# Patient Record
Sex: Female | Born: 1935
Health system: Southern US, Community
[De-identification: ages and names within clinical notes are randomized; demographics above are authoritative.]

## PROBLEM LIST (undated history)

## (undated) DIAGNOSIS — D649 Anemia, unspecified: Secondary | ICD-10-CM

## (undated) DIAGNOSIS — G20 Parkinson's disease: Secondary | ICD-10-CM

## (undated) DIAGNOSIS — R29898 Other symptoms and signs involving the musculoskeletal system: Secondary | ICD-10-CM

## (undated) DIAGNOSIS — L4050 Arthropathic psoriasis, unspecified: Secondary | ICD-10-CM

## (undated) DIAGNOSIS — H04123 Dry eye syndrome of bilateral lacrimal glands: Secondary | ICD-10-CM

## (undated) DIAGNOSIS — C679 Malignant neoplasm of bladder, unspecified: Secondary | ICD-10-CM

## (undated) DIAGNOSIS — F418 Other specified anxiety disorders: Secondary | ICD-10-CM

## (undated) DIAGNOSIS — I10 Essential (primary) hypertension: Secondary | ICD-10-CM

## (undated) DIAGNOSIS — M47812 Spondylosis without myelopathy or radiculopathy, cervical region: Secondary | ICD-10-CM

## (undated) DIAGNOSIS — M542 Cervicalgia: Secondary | ICD-10-CM

## (undated) DIAGNOSIS — H269 Unspecified cataract: Secondary | ICD-10-CM

## (undated) DIAGNOSIS — M1990 Unspecified osteoarthritis, unspecified site: Secondary | ICD-10-CM

## (undated) DIAGNOSIS — S66819A Strain of other specified muscles, fascia and tendons at wrist and hand level, unspecified hand, initial encounter: Secondary | ICD-10-CM

## (undated) DIAGNOSIS — M48061 Spinal stenosis, lumbar region without neurogenic claudication: Secondary | ICD-10-CM

## (undated) DIAGNOSIS — N189 Chronic kidney disease, unspecified: Secondary | ICD-10-CM

## (undated) DIAGNOSIS — D229 Melanocytic nevi, unspecified: Secondary | ICD-10-CM

## (undated) DIAGNOSIS — R0600 Dyspnea, unspecified: Secondary | ICD-10-CM

## (undated) DIAGNOSIS — T7840XA Allergy, unspecified, initial encounter: Secondary | ICD-10-CM

## (undated) DIAGNOSIS — E785 Hyperlipidemia, unspecified: Secondary | ICD-10-CM

## (undated) DIAGNOSIS — R310 Gross hematuria: Secondary | ICD-10-CM

## (undated) DIAGNOSIS — Z96612 Presence of left artificial shoulder joint: Secondary | ICD-10-CM

## (undated) DIAGNOSIS — Z87442 Personal history of urinary calculi: Secondary | ICD-10-CM

## (undated) HISTORY — DX: Unspecified cataract: H26.9

## (undated) HISTORY — PX: TONSILLECTOMY AND ADENOIDECTOMY: SUR1326

## (undated) HISTORY — DX: Arthropathic psoriasis, unspecified: L40.50

## (undated) HISTORY — DX: Hyperlipidemia, unspecified: E78.5

## (undated) HISTORY — PX: COSMETIC SURGERY: SHX468

## (undated) HISTORY — DX: Chronic kidney disease, unspecified: N18.9

## (undated) HISTORY — PX: COLONOSCOPY: SHX174

## (undated) HISTORY — DX: Presence of left artificial shoulder joint: Z96.612

## (undated) HISTORY — PX: SPINE SURGERY: SHX786

## (undated) HISTORY — DX: Anemia, unspecified: D64.9

## (undated) HISTORY — PX: CATARACT EXTRACTION W/ INTRAOCULAR LENS  IMPLANT, BILATERAL: SHX1307

## (undated) HISTORY — DX: Allergy, unspecified, initial encounter: T78.40XA

## (undated) HISTORY — DX: Unspecified osteoarthritis, unspecified site: M19.90

## (undated) HISTORY — PX: EYE SURGERY: SHX253

## (undated) HISTORY — DX: Malignant neoplasm of bladder, unspecified: C67.9

## (undated) HISTORY — DX: Other specified anxiety disorders: F41.8

## (undated) HISTORY — PX: LAPAROSCOPY: SHX197

## (undated) HISTORY — DX: Spondylosis without myelopathy or radiculopathy, cervical region: M47.812

## (undated) HISTORY — PX: DILATION AND CURETTAGE OF UTERUS: SHX78

## (undated) HISTORY — DX: Parkinson's disease: G20

## (undated) HISTORY — PX: JOINT REPLACEMENT: SHX530

---
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
## 1996-05-25 HISTORY — PX: METACARPOPHALANGEAL JOINT ARTHRODESIS: SUR59

## 1998-06-09 ENCOUNTER — Ambulatory Visit (HOSPITAL_BASED_OUTPATIENT_CLINIC_OR_DEPARTMENT_OTHER): Admission: RE | Admit: 1998-06-09 | Discharge: 1998-06-09 | Payer: Self-pay | Admitting: Orthopedic Surgery

## 1998-10-21 ENCOUNTER — Other Ambulatory Visit: Admission: RE | Admit: 1998-10-21 | Discharge: 1998-10-21 | Payer: Self-pay | Admitting: Obstetrics and Gynecology

## 1999-09-29 ENCOUNTER — Other Ambulatory Visit: Admission: RE | Admit: 1999-09-29 | Discharge: 1999-09-29 | Payer: Self-pay | Admitting: Obstetrics and Gynecology

## 2000-07-11 ENCOUNTER — Ambulatory Visit (HOSPITAL_COMMUNITY): Admission: RE | Admit: 2000-07-11 | Discharge: 2000-07-11 | Payer: Self-pay | Admitting: Obstetrics and Gynecology

## 2000-07-11 ENCOUNTER — Encounter (INDEPENDENT_AMBULATORY_CARE_PROVIDER_SITE_OTHER): Payer: Self-pay | Admitting: Specialist

## 2000-07-11 HISTORY — PX: OTHER SURGICAL HISTORY: SHX169

## 2000-10-11 ENCOUNTER — Other Ambulatory Visit: Admission: RE | Admit: 2000-10-11 | Discharge: 2000-10-11 | Payer: Self-pay | Admitting: Obstetrics and Gynecology

## 2001-10-18 ENCOUNTER — Other Ambulatory Visit: Admission: RE | Admit: 2001-10-18 | Discharge: 2001-10-18 | Payer: Self-pay | Admitting: Obstetrics and Gynecology

## 2002-07-13 ENCOUNTER — Encounter: Admission: RE | Admit: 2002-07-13 | Discharge: 2002-07-13 | Payer: Self-pay | Admitting: Cardiology

## 2002-07-13 ENCOUNTER — Encounter: Payer: Self-pay | Admitting: Cardiology

## 2002-08-09 ENCOUNTER — Encounter (HOSPITAL_COMMUNITY): Admission: RE | Admit: 2002-08-09 | Discharge: 2002-11-07 | Payer: Self-pay | Admitting: Otolaryngology

## 2002-08-10 ENCOUNTER — Encounter: Payer: Self-pay | Admitting: Otolaryngology

## 2002-08-16 ENCOUNTER — Ambulatory Visit (HOSPITAL_COMMUNITY): Admission: RE | Admit: 2002-08-16 | Discharge: 2002-08-16 | Payer: Self-pay | Admitting: Otolaryngology

## 2002-08-16 ENCOUNTER — Encounter (INDEPENDENT_AMBULATORY_CARE_PROVIDER_SITE_OTHER): Payer: Self-pay | Admitting: Specialist

## 2002-08-16 ENCOUNTER — Encounter: Payer: Self-pay | Admitting: Otolaryngology

## 2002-11-27 ENCOUNTER — Other Ambulatory Visit: Admission: RE | Admit: 2002-11-27 | Discharge: 2002-11-27 | Payer: Self-pay | Admitting: Obstetrics and Gynecology

## 2003-12-24 ENCOUNTER — Other Ambulatory Visit: Admission: RE | Admit: 2003-12-24 | Discharge: 2003-12-24 | Payer: Self-pay | Admitting: Obstetrics and Gynecology

## 2005-02-17 ENCOUNTER — Other Ambulatory Visit: Admission: RE | Admit: 2005-02-17 | Discharge: 2005-02-17 | Payer: Self-pay | Admitting: Obstetrics and Gynecology

## 2006-02-23 ENCOUNTER — Other Ambulatory Visit: Admission: RE | Admit: 2006-02-23 | Discharge: 2006-02-23 | Payer: Self-pay | Admitting: Obstetrics & Gynecology

## 2006-06-20 ENCOUNTER — Encounter: Admission: RE | Admit: 2006-06-20 | Discharge: 2006-06-20 | Payer: Self-pay | Admitting: Orthopedic Surgery

## 2006-06-23 ENCOUNTER — Ambulatory Visit (HOSPITAL_BASED_OUTPATIENT_CLINIC_OR_DEPARTMENT_OTHER): Admission: RE | Admit: 2006-06-23 | Discharge: 2006-06-23 | Payer: Self-pay | Admitting: Orthopedic Surgery

## 2006-06-23 ENCOUNTER — Encounter (INDEPENDENT_AMBULATORY_CARE_PROVIDER_SITE_OTHER): Payer: Self-pay | Admitting: *Deleted

## 2006-06-23 HISTORY — PX: OTHER SURGICAL HISTORY: SHX169

## 2007-03-02 ENCOUNTER — Other Ambulatory Visit: Admission: RE | Admit: 2007-03-02 | Discharge: 2007-03-02 | Payer: Self-pay | Admitting: Obstetrics & Gynecology

## 2008-03-05 ENCOUNTER — Other Ambulatory Visit: Admission: RE | Admit: 2008-03-05 | Discharge: 2008-03-05 | Payer: Self-pay | Admitting: Obstetrics and Gynecology

## 2009-01-06 ENCOUNTER — Encounter: Admission: RE | Admit: 2009-01-06 | Discharge: 2009-01-06 | Payer: Self-pay | Admitting: Orthopedic Surgery

## 2009-01-09 ENCOUNTER — Ambulatory Visit (HOSPITAL_BASED_OUTPATIENT_CLINIC_OR_DEPARTMENT_OTHER): Admission: RE | Admit: 2009-01-09 | Discharge: 2009-01-09 | Payer: Self-pay | Admitting: Orthopedic Surgery

## 2009-01-09 ENCOUNTER — Encounter (INDEPENDENT_AMBULATORY_CARE_PROVIDER_SITE_OTHER): Payer: Self-pay | Admitting: Orthopedic Surgery

## 2009-01-09 HISTORY — PX: OTHER SURGICAL HISTORY: SHX169

## 2010-07-15 ENCOUNTER — Ambulatory Visit: Payer: Self-pay | Admitting: Oncology

## 2010-07-24 LAB — CBC WITH DIFFERENTIAL/PLATELET
BASO%: 0.4 % (ref 0.0–2.0)
Basophils Absolute: 0 10*3/uL (ref 0.0–0.1)
EOS%: 0.9 % (ref 0.0–7.0)
Eosinophils Absolute: 0.1 10*3/uL (ref 0.0–0.5)
HCT: 43.1 % (ref 34.8–46.6)
HGB: 14.7 g/dL (ref 11.6–15.9)
LYMPH%: 12.8 % — ABNORMAL LOW (ref 14.0–49.7)
MCH: 30.9 pg (ref 25.1–34.0)
MCHC: 34.2 g/dL (ref 31.5–36.0)
MCV: 90.4 fL (ref 79.5–101.0)
MONO#: 0.4 10*3/uL (ref 0.1–0.9)
MONO%: 5.4 % (ref 0.0–14.0)
NEUT#: 5.6 10*3/uL (ref 1.5–6.5)
NEUT%: 80.5 % — ABNORMAL HIGH (ref 38.4–76.8)
Platelets: 189 10*3/uL (ref 145–400)
RBC: 4.77 10*6/uL (ref 3.70–5.45)
RDW: 12.7 % (ref 11.2–14.5)
WBC: 7 10*3/uL (ref 3.9–10.3)
lymph#: 0.9 10*3/uL (ref 0.9–3.3)

## 2010-08-04 LAB — HYPERCOAGULABLE PANEL, COMPREHENSIVE
AntiThromb III Func: 98 % (ref 76–126)
Anticardiolipin IgA: 1 APL U/mL (ref <22)
Anticardiolipin IgG: 17 GPL U/mL (ref <23)
Anticardiolipin IgM: 1 MPL U/mL (ref <11)
Beta-2 Glyco I IgG: 0 G Units (ref <20)
Beta-2-Glycoprotein I IgA: 1 A Units (ref <20)
Beta-2-Glycoprotein I IgM: 3 M Units (ref <20)
DRVVT: 35.7 secs — ABNORMAL LOW (ref 36.2–44.3)
Homocysteine: 9.8 umol/L (ref 4.0–15.4)
Lupus Anticoagulant: NOT DETECTED
PTT Lupus Anticoagulant: 30.4 secs (ref 30.0–45.6)
Protein C Activity: 107 % (ref 75–133)
Protein C, Total: 71 % (ref 70–140)
Protein S Activity: 70 % (ref 69–129)
Protein S Ag, Total: 80 % (ref 70–140)

## 2010-08-04 LAB — COMPREHENSIVE METABOLIC PANEL
ALT: 16 U/L (ref 0–35)
AST: 23 U/L (ref 0–37)
Albumin: 4.5 g/dL (ref 3.5–5.2)
Alkaline Phosphatase: 37 U/L — ABNORMAL LOW (ref 39–117)
BUN: 19 mg/dL (ref 6–23)
CO2: 24 mEq/L (ref 19–32)
Calcium: 9.7 mg/dL (ref 8.4–10.5)
Chloride: 102 mEq/L (ref 96–112)
Creatinine, Ser: 0.83 mg/dL (ref 0.40–1.20)
Glucose, Bld: 99 mg/dL (ref 70–99)
Potassium: 3.7 mEq/L (ref 3.5–5.3)
Sodium: 138 mEq/L (ref 135–145)
Total Bilirubin: 0.7 mg/dL (ref 0.3–1.2)
Total Protein: 6.3 g/dL (ref 6.0–8.3)

## 2010-08-04 LAB — PROTHROMBIN TIME: Prothrombin Time: 14.5 seconds (ref 11.6–15.2)

## 2010-08-04 LAB — APTT: aPTT: 25 seconds (ref 24–37)

## 2010-11-15 DIAGNOSIS — G20 Parkinson's disease: Secondary | ICD-10-CM

## 2010-11-15 DIAGNOSIS — G20A1 Parkinson's disease without dyskinesia, without mention of fluctuations: Secondary | ICD-10-CM

## 2010-11-15 HISTORY — DX: Parkinson's disease: G20

## 2010-11-15 HISTORY — DX: Parkinson's disease without dyskinesia, without mention of fluctuations: G20.A1

## 2010-11-17 ENCOUNTER — Ambulatory Visit: Payer: Self-pay | Admitting: Cardiology

## 2011-03-02 LAB — BASIC METABOLIC PANEL
BUN: 15 mg/dL (ref 6–23)
CO2: 28 mEq/L (ref 19–32)
Calcium: 9.3 mg/dL (ref 8.4–10.5)
GFR calc non Af Amer: >60 mL/min (ref >60)
Glucose, Bld: 108 mg/dL — ABNORMAL HIGH (ref 70–99)
Sodium: 136 mEq/L (ref 135–145)

## 2011-03-23 ENCOUNTER — Other Ambulatory Visit: Payer: Self-pay | Admitting: *Deleted

## 2011-03-23 NOTE — Telephone Encounter (Signed)
Refilled meds per fax request.  

## 2011-03-29 MED ORDER — DIAZEPAM 5 MG PO TABS: 5.0000 mg | ORAL_TABLET | Freq: Every evening | ORAL | Status: DC | PRN

## 2011-03-30 NOTE — Op Note (Signed)
NAMEBELENDA, Stephanie [name] NO.:  [id number]   MEDICAL RECORD NO.:  [id number]          PATIENT TYPE:  AMB   LOCATION:  DSC                          FACILITY:  [facility]   PHYSICIAN:  Cindee Salt, M.D.       DATE OF BIRTH:  [birth year]   DATE OF PROCEDURE:  01/09/2009  DATE OF DISCHARGE:                               OPERATIVE REPORT   PREOPERATIVE DIAGNOSIS:  Mass/cyst, right hand.   POSTOPERATIVE DIAGNOSIS:  Mass/cyst, right hand plus foreign body.   OPERATION:  Excision of cyst and debridement, wrist and removal of  foreign body.   SURGEON:  Cindee Salt, MD   ANESTHESIA:  Forearm IV regional.   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  She is admitted now with recurrence of a cyst in the  area of one of the incisions just distal.  X-rays reveal cystic  formation about the joint consistent with silicone synovitis with the  possibility of further debridement.  She is aware of risks and  complications including infection, recurrence, injury to arteries,  nerves, tendons, incomplete relief of symptoms, and dystrophy.  In the  preoperative area, the patient is seen, the extremity marked by both the  patient and surgeon, and antibiotic given.   PROCEDURE IN DETAIL:  The patient was brought to the operating room  where a forearm based IV regional anesthetic was carried out without  difficulty under the direction of Dr. Sampson Goon.  She was prepped using  DuraPrep, supine position, right arm free.  A time-out was taken.  Longitudinal incision was made over the mass, carried down through  subcutaneous tissue.  Neurovascular structures identified and protected.  A cystic structure was immediately encountered.  This was opened.  A  suture was found to be at the base.  This was removed.  The cyst itself  was sent to pathology.  No purulence was seen.  The  wound was irrigated.  The skin was closed with interrupted 5-0 Vicryl Rapide sutures.  Sterile  compressive dressing was applied.  The patient tolerated the procedure  well and was taken to the recovery room for observation in satisfactory  condition.  She will be discharged home to return to the [facility]  [place] in 1 week on [place].           ______________________________  Cindee Salt, M.D.     GK/MEDQ  D:  01/09/2009  T:  01/10/2009  Job:  [id number]

## 2011-04-02 NOTE — Op Note (Signed)
[facility]  Patient:    Stephanie [name], Stephanie [name]                      MRN: [id number] Proc. Date: 07/11/00 Adm. Date:  [id number] Disc. Date: [id number] Attending:  Brynda Peon                           Operative Report  PREOPERATIVE DIAGNOSIS:       Postmenopausal bleeding, secondary to                               endometrial polyp.  POSTOPERATIVE DIAGNOSIS:      Postmenopausal bleeding, secondary to                               endometrial polyp, pathology pending.  PROCEDURE:                    1. Hysteroscopy.                               2. Resection of endometrial polyp.                               3. Dilatation and curettage.  SURGEON:                      Cynthia P. Ashley Royalty, M.D.  ANESTHESIA:                   General by LMA.  ESTIMATED BLOOD LOSS:         Minimal.  COMPLICATIONS:                None.  FLUID DEFICIT:                100 cc.  DESCRIPTION OF PROCEDURE:     The patient was taken to the operating room and after the induction of adequate general anesthesia, was placed in the dorsal lithotomy position and prepped and draped in the usual fashion.  The cervix was dilated to a #31 Pratt.  The operative hysteroscope was introduced.  There was noted to be a long thin endometrial polyp that had been previously noted on ultrasound.  This was removed with the wire loop.  The hysteroscope was then removed.  An endometrial curettage was carried out.  The specimens sent to pathology.  The hysteroscope was reinserted and the endometrial cavity appeared clean.  The procedure was terminated.  The patient tolerated the procedure well and went in satisfactory condition to the post anesthesia recovery. DD:  07/28/00 TD:  07/30/00 Job: [id number]

## 2011-04-02 NOTE — Op Note (Signed)
Stephanie [name], Stephanie [name] NO.:  [id number]   MEDICAL RECORD NO.:  [id number]          PATIENT TYPE:  AMB   LOCATION:  DSC                          FACILITY:  [facility]   PHYSICIAN:  Cindee Salt, M.D.       DATE OF BIRTH:  1932-07-25   DATE OF PROCEDURE:  DATE OF DISCHARGE:                                 OPERATIVE REPORT   SURGEON:  Cindee Salt, M.D.   ASSISTANT:  __________, RN.   PREOPERATIVE DIAGNOSIS:  Mass, metacarpal, left thumb.   POSTOPERATIVE DIAGNOSIS:  Mass, metacarpal, left thumb.   OPERATION:  Biopsy, left first metacarpal.   ANESTHESIA:  Forearm-based IV regional.   HISTORY:  The patient is a 77 year old female with a history of pantrapezial  arthritis.  She has had a CT scan done revealing a lesion in her first  metacarpal.  She is admitted for biopsy.   PROCEDURE:  The patient was brought to the operating room, where a forearm-  based IV regional anesthetic was carried out without difficulty.  She was  prepped and draped using DuraPrep in supine position, left arm free.  A  longitudinal incision was made over the base of the firs metacarpal, the  radial aspect, carried down through subcutaneous tissue.  The sensory nerves  were identified and protected.  The dissection was carried down to the  metacarpal.  A drill hole was placed under image intensification.  A curet  was used to remove a portion of the medullary bone.  Cultures were also  taken for both aerobic, anaerobic and fungal cultures.  The biopsied  specimen was sent to pathology.  The wound was irrigated.  The periosteum  was closed with interrupted 4-0 chromic sutures, the subcutaneous tissue  with 4-0 chromic, and the skin with interrupted 5-0 nylon sutures.  Sterile  compressive dressing and thumb spica splint applied.  The patient tolerated  the procedure well and was taken to the recovery room for observation in  satisfactory condition.  She is discharged home to return to the hand  clinic  in 1 week on [place] Nx.           ______________________________  Cindee Salt, M.D.     GK/MEDQ  D:  06/23/2006  T:  06/23/2006  Job:  [id number]

## 2011-05-05 ENCOUNTER — Encounter: Payer: Self-pay | Admitting: Cardiology

## 2011-05-11 ENCOUNTER — Other Ambulatory Visit: Payer: Self-pay | Admitting: *Deleted

## 2011-05-11 DIAGNOSIS — E785 Hyperlipidemia, unspecified: Secondary | ICD-10-CM

## 2011-05-12 ENCOUNTER — Ambulatory Visit (INDEPENDENT_AMBULATORY_CARE_PROVIDER_SITE_OTHER): Payer: Medicare Other | Admitting: Cardiology

## 2011-05-12 ENCOUNTER — Encounter: Payer: Self-pay | Admitting: Cardiology

## 2011-05-12 ENCOUNTER — Other Ambulatory Visit (INDEPENDENT_AMBULATORY_CARE_PROVIDER_SITE_OTHER): Payer: Medicare Other | Admitting: *Deleted

## 2011-05-12 DIAGNOSIS — R109 Unspecified abdominal pain: Secondary | ICD-10-CM

## 2011-05-12 DIAGNOSIS — G8929 Other chronic pain: Secondary | ICD-10-CM | POA: Insufficient documentation

## 2011-05-12 DIAGNOSIS — I119 Hypertensive heart disease without heart failure: Secondary | ICD-10-CM | POA: Insufficient documentation

## 2011-05-12 DIAGNOSIS — E785 Hyperlipidemia, unspecified: Secondary | ICD-10-CM

## 2011-05-12 DIAGNOSIS — R1011 Right upper quadrant pain: Secondary | ICD-10-CM

## 2011-05-12 LAB — LIPID PANEL
HDL: 50.2 mg/dL (ref >39.00)
Total CHOL/HDL Ratio: 4
VLDL: 23.8 mg/dL (ref 0.0–40.0)

## 2011-05-12 LAB — BASIC METABOLIC PANEL
Calcium: 9.4 mg/dL (ref 8.4–10.5)
GFR: 86.17 mL/min (ref >60.00)
Sodium: 139 mEq/L (ref 135–145)

## 2011-05-12 LAB — HEPATIC FUNCTION PANEL
Alkaline Phosphatase: 38 U/L — ABNORMAL LOW (ref 39–117)
Bilirubin, Direct: 0.2 mg/dL (ref 0.0–0.3)

## 2011-05-12 NOTE — Progress Notes (Signed)
Stephanie [name] Date of Birth:  [birth year] [facility] / [facility] [address]   [address] [place], [place]  [address] [phone]           Fax   [phone]  HPI: This pleasant elderly woman is seen for a scheduled followup office visit.  She has a past history of essential hypertension.  She has not been expressing any chest pain or shortness of breath.  He has been under a lot of emotional stress chronically at home.  Recently she's been experiencing some nagging right upper quadrant pain for the past 6 months and she does have a family history of gallstones  Current Outpatient Prescriptions  Medication Sig Dispense Refill  . Calcium-Vitamin D-Vitamin K (CALCIUM + D + K PO) Take 600 mg by mouth daily.        . Cholecalciferol (VITAMIN D3) 2000 UNITS TABS Take by mouth daily.        . Coenzyme Q10 (COQ10 PO) Take by mouth daily.        Marland Kitchen dextromethorphan-guaiFENesin (MUCINEX DM) 30-600 MG per 12 hr tablet Take 1 tablet by mouth as needed.        Marland Kitchen estrogens, conjugated, (PREMARIN) 0.625 MG tablet Take 0.625 mg by mouth daily. Take daily for 21 days then do not take for 7 days.       . Glucosamine-Chondroitin (COSAMIN DS PO) Take by mouth daily.        . hydrochlorothiazide 25 MG tablet Take 25 mg by mouth daily.        . meloxicam (MOBIC) 7.5 MG tablet Take 7.5 mg by mouth daily.        . diazepam (VALIUM) 5 MG tablet Take 5 mg by mouth at bedtime as needed. Taking 1/2 as needed       . DISCONTD: diazepam (VALIUM) 5 MG tablet Take 1 tablet (5 mg total) by mouth at bedtime as needed for sleep.  30 tablet  2    Allergies  Allergen Reactions  . Lodine (Etodolac)   . Penicillins     Patient Active Problem List  Diagnoses  . Abdominal pain, chronic, right upper quadrant  . Benign hypertensive heart disease without heart failure    History  Smoking status  . Former Smoker  . Quit date: 05/04/1966  Smokeless tobacco  . Not on file    History  Alcohol Use  No    Family History  Problem Relation Age of Onset  . Arthritis Mother   . Arthritis Father   . Diabetes Sister     Review of Systems: The patient denies any heat or cold intolerance.  No weight gain or weight loss.  The patient denies headaches or blurry vision.  There is no cough or sputum production.  The patient denies dizziness.  There is no hematuria or hematochezia.  The patient denies any muscle aches or arthritis.  The patient denies any rash.  The patient denies frequent falling or instability.  There is no history of depression or anxiety.  All other systems were reviewed and are negative.   Physical Exam: Filed Vitals:   05/12/11 0857  BP: 136/80  Pulse: 74  The general appearance feels a well-developed well-nourished woman in no distress.The head and neck exam reveals pupils equal and reactive.  Extraocular movements are full.  There is no scleral icterus.  The mouth and pharynx are normal.  The neck is supple.  The carotids reveal no bruits.  The jugular  venous pressure is normal.  The  thyroid is not enlarged.  There is no lymphadenopathy.  The chest is clear to percussion and auscultation.  There are no rales or rhonchi.  Expansion of the chest is symmetrical.  The precordium is quiet.  The first heart sound is normal.  The second heart sound is physiologically split.  There is no murmur gallop rub or click.  There is no abnormal lift or heave.  The abdomen is soft and nontender.  The bowel sounds are normal.  The liver and spleen are not enlarged.  There are no abdominal masses.  There are no abdominal bruits.  Extremities reveal good pedal pulses.  There is no phlebitis or edema.  There is no cyanosis or clubbing.  Strength is normal and symmetrical in all extremities.  There is no lateralizing weakness.  There are no sensory deficits.  The skin is warm and dry.  There is no rash.    Assessment / Plan: Continue present medication.  We will arrange for upper abdominal  ultrasound to look into the possibility of cholelithiasis.  Recheck in 6 months for followup office visit fasting lipid panel hepatic function panel and basal metabolic panel

## 2011-05-12 NOTE — Assessment & Plan Note (Signed)
The patient has been experiencing some intermittent right upper quadrant pain for the past 6 months.  She does not have known cholelithiasis but both her sister and her mother had their gallbladders taken out.  We will plan to get an ultrasound on her.  She does not know if food makes the pain worse or not but she thinks it might.

## 2011-05-13 ENCOUNTER — Telehealth: Payer: Self-pay | Admitting: *Deleted

## 2011-05-13 ENCOUNTER — Ambulatory Visit
Admission: RE | Admit: 2011-05-13 | Discharge: 2011-05-13 | Disposition: A | Discharge status: Home / Self Care | Payer: Medicare Other | Source: Ambulatory Visit | Attending: Cardiology | Admitting: Cardiology

## 2011-05-13 NOTE — Telephone Encounter (Signed)
Message copied by Burnell Blanks on Thu May 13, 2011 11:34 AM ------      Message from: Cassell Clement      Created: Thu May 13, 2011 10:17 AM       Please report.  The kidney and liver tests are normal.  Potassium is normal.The triglycerides are normal and the LDL cholesterol is slightly elevated.  Continue prudent diet and regular exercise.

## 2011-05-13 NOTE — Progress Notes (Signed)
Advised 

## 2011-05-13 NOTE — Telephone Encounter (Signed)
Message copied by Burnell Blanks on Thu May 13, 2011 11:34 AM ------      Message from: Cassell Clement      Created: Thu May 13, 2011 10:18 AM       Please report.  The gallbladder is normal.  The entire ultrasound was fine.  No problems seen.

## 2011-05-13 NOTE — Telephone Encounter (Signed)
Advised of labs 

## 2011-05-13 NOTE — Progress Notes (Signed)
advised

## 2011-05-14 ENCOUNTER — Other Ambulatory Visit: Payer: Self-pay | Admitting: Cardiology

## 2011-05-14 DIAGNOSIS — I119 Hypertensive heart disease without heart failure: Secondary | ICD-10-CM

## 2011-05-14 DIAGNOSIS — F419 Anxiety disorder, unspecified: Secondary | ICD-10-CM

## 2011-05-14 MED ORDER — DIAZEPAM 5 MG PO TABS: ORAL_TABLET | ORAL | Status: DC

## 2011-05-14 NOTE — Telephone Encounter (Signed)
Spoke with patient and she still uses valium prn and received refill request via fax.

## 2011-08-04 ENCOUNTER — Telehealth: Payer: Self-pay | Admitting: Cardiology

## 2011-08-04 NOTE — Telephone Encounter (Signed)
Pt called. She had a few questions about a primary care Dr.m Please call before 2pm if possible

## 2011-08-05 NOTE — Telephone Encounter (Signed)
Keep appointment with physician she has and if not happy will recommend someone else

## 2011-08-25 ENCOUNTER — Encounter: Payer: Self-pay | Admitting: Family Medicine

## 2011-08-25 ENCOUNTER — Ambulatory Visit (INDEPENDENT_AMBULATORY_CARE_PROVIDER_SITE_OTHER): Payer: Medicare Other | Admitting: Family Medicine

## 2011-08-25 DIAGNOSIS — M1990 Unspecified osteoarthritis, unspecified site: Secondary | ICD-10-CM

## 2011-08-25 DIAGNOSIS — R259 Unspecified abnormal involuntary movements: Secondary | ICD-10-CM

## 2011-08-25 DIAGNOSIS — F32A Depression, unspecified: Secondary | ICD-10-CM

## 2011-08-25 DIAGNOSIS — F329 Major depressive disorder, single episode, unspecified: Secondary | ICD-10-CM

## 2011-08-25 DIAGNOSIS — F3289 Other specified depressive episodes: Secondary | ICD-10-CM

## 2011-08-25 DIAGNOSIS — I10 Essential (primary) hypertension: Secondary | ICD-10-CM

## 2011-08-25 DIAGNOSIS — M129 Arthropathy, unspecified: Secondary | ICD-10-CM

## 2011-08-25 DIAGNOSIS — R251 Tremor, unspecified: Secondary | ICD-10-CM

## 2011-08-25 NOTE — Patient Instructions (Signed)
Schedule a complete physical at your convenience Keep up the good work!  You look great! Consider starting a daily medication for depression/anxiety- we can do this at any time Call with any questions or concerns Think of [place] as your home base Welcome!  We're glad to have you! Hang in there!

## 2011-08-25 NOTE — Progress Notes (Signed)
  Subjective:    Patient ID: Stephanie [name], female    DOB: 10-01-1932, 77 y.o.   MRN: [id number]  HPI New to establish.  Previous MD- [facility].  GYN- Leda Quail, UTD on pap and mammo.  Colonoscopy- 6-7 yrs ago  HTN- chronic problem for pt, on HCTZ.  Well controlled.  Walks 2 miles daily.  No CP, SOB, HAs, visual changes, edema.  Depression- chronic problem for pt, husband was frequenting prostitutes.  They are now in counseling.  Takes 1/2 valium nightly.  Pt prefers not to start daily medicine.  Sleeping well.  Arthritis- severe arthritis of L hip and R hand.  Has seen Dr Merlyn Lot and Dr Despina Hick.  Taking daily Voltaren and has had hip injxn.  Walking regularly.  Tremor- is seeing Dr Rubin Payor at [facility], tremor of L hand, 'i have 2 of 4 markers for Parkinsons'.  Not on meds.   Review of Systems For ROS see HPI     Objective:   Physical Exam  Vitals reviewed. Constitutional: She is oriented to person, place, and time. She appears well-developed and well-nourished. No distress.  HENT:  Head: Normocephalic and atraumatic.  Eyes: Conjunctivae and EOM are normal. Pupils are equal, round, and reactive to light.  Neck: Normal range of motion. Neck supple. No thyromegaly present.  Cardiovascular: Normal rate, regular rhythm, normal heart sounds and intact distal pulses.   No murmur heard. Pulmonary/Chest: Effort normal and breath sounds normal. No respiratory distress.  Abdominal: Soft. She exhibits no distension. There is no tenderness.  Musculoskeletal: She exhibits no edema.  Lymphadenopathy:    She has no cervical adenopathy.  Neurological: She is alert and oriented to person, place, and time.       Faint resting tremor of L hand  Skin: Skin is warm and dry.  Psychiatric: She has a normal mood and affect. Her behavior is normal.          Assessment & Plan:

## 2011-08-27 ENCOUNTER — Ambulatory Visit (INDEPENDENT_AMBULATORY_CARE_PROVIDER_SITE_OTHER): Payer: Medicare Other | Admitting: Family Medicine

## 2011-08-27 DIAGNOSIS — Z23 Encounter for immunization: Secondary | ICD-10-CM

## 2011-08-27 NOTE — Progress Notes (Signed)
  Subjective:    Patient ID: Stephanie [name], female    DOB: 1932-07-28, 77 y.o.   MRN: [id number]  HPI Here today for shingles shot   Review of Systems     Objective:   Physical Exam        Assessment & Plan:

## 2011-08-31 DIAGNOSIS — M25561 Pain in right knee: Secondary | ICD-10-CM | POA: Insufficient documentation

## 2011-08-31 DIAGNOSIS — I10 Essential (primary) hypertension: Secondary | ICD-10-CM | POA: Insufficient documentation

## 2011-08-31 DIAGNOSIS — R251 Tremor, unspecified: Secondary | ICD-10-CM | POA: Insufficient documentation

## 2011-08-31 DIAGNOSIS — F32A Depression, unspecified: Secondary | ICD-10-CM | POA: Insufficient documentation

## 2011-08-31 DIAGNOSIS — F329 Major depressive disorder, single episode, unspecified: Secondary | ICD-10-CM | POA: Insufficient documentation

## 2011-08-31 NOTE — Assessment & Plan Note (Signed)
Following w/ neuro at [facility].  ? Parkinson's.  Will follow along and assist as able.

## 2011-08-31 NOTE — Assessment & Plan Note (Signed)
Excellent control.  Asymptomatic.  No changes. 

## 2011-08-31 NOTE — Assessment & Plan Note (Signed)
Pt is following w/ specialists for this.  On voltaren daily and receiving prn hip injxns.  Will continue to follow and assist as able.

## 2011-08-31 NOTE — Assessment & Plan Note (Signed)
Pt highly stressed by situation w/ husband.  In counseling.  Not interested in daily meds.  Using Valium prn.  Will continue this regimen as long as it is working for pt.  Will follow closely.

## 2011-09-09 ENCOUNTER — Other Ambulatory Visit (INDEPENDENT_AMBULATORY_CARE_PROVIDER_SITE_OTHER): Payer: Medicare Other

## 2011-09-09 DIAGNOSIS — M19049 Primary osteoarthritis, unspecified hand: Secondary | ICD-10-CM

## 2011-09-09 NOTE — Progress Notes (Signed)
Labs only

## 2011-09-10 LAB — CBC WITH DIFFERENTIAL/PLATELET
Basophils Relative: 0.4 % (ref 0.0–3.0)
Eosinophils Absolute: 0.2 10*3/uL (ref 0.0–0.7)
Eosinophils Relative: 2.9 % (ref 0.0–5.0)
Lymphocytes Relative: 19.4 % (ref 12.0–46.0)
MCHC: 34 g/dL (ref 30.0–36.0)
Neutrophils Relative %: 72.6 % (ref 43.0–77.0)
RBC: 4.32 Mil/uL (ref 3.87–5.11)
WBC: 5.4 10*3/uL (ref 4.5–10.5)

## 2011-09-10 LAB — HEPATIC FUNCTION PANEL
AST: 32 U/L (ref 0–37)
Total Bilirubin: 0.6 mg/dL (ref 0.3–1.2)

## 2011-09-10 LAB — CREATININE, SERUM: Creatinine, Ser: 0.9 mg/dL (ref 0.4–1.2)

## 2011-09-17 ENCOUNTER — Encounter: Payer: Self-pay | Admitting: *Deleted

## 2011-10-25 ENCOUNTER — Telehealth: Payer: Self-pay | Admitting: Family Medicine

## 2011-10-26 NOTE — Telephone Encounter (Signed)
Ok to start Zoloft 50mg - should start w/ 1/2 tab nightly x1 week and then increase to 1 tab daily.   #30, 3 refills.  Should schedule appt for 1 month after starting meds to make sure things are improving.

## 2011-10-26 NOTE — Telephone Encounter (Signed)
Last OV 08-27-11

## 2011-10-27 NOTE — Telephone Encounter (Signed)
Spoke to pt about Zoloft, pt advised that her husband takes Lexapro and she was wondering if she could take this per she is thinking this is not as strong as Zoloft?

## 2011-10-28 MED ORDER — ESCITALOPRAM OXALATE 10 MG PO TABS: 10.0000 mg | ORAL_TABLET | Freq: Every day | ORAL | Status: DC

## 2011-10-28 NOTE — Telephone Encounter (Signed)
rx sent to pharmacy by e-script Pt is aware, will pick up from [facility]

## 2011-10-28 NOTE — Telephone Encounter (Signed)
Lexapro 10mg  would be fine.

## 2011-11-03 ENCOUNTER — Other Ambulatory Visit: Payer: Medicare Other | Admitting: *Deleted

## 2011-11-03 ENCOUNTER — Ambulatory Visit (INDEPENDENT_AMBULATORY_CARE_PROVIDER_SITE_OTHER): Payer: Medicare Other | Admitting: Cardiology

## 2011-11-03 ENCOUNTER — Encounter: Payer: Self-pay | Admitting: Cardiology

## 2011-11-03 VITALS — BP 118/80 | HR 80 | Ht 65.0 in | Wt 121.0 lb

## 2011-11-03 DIAGNOSIS — M129 Arthropathy, unspecified: Secondary | ICD-10-CM

## 2011-11-03 DIAGNOSIS — M1990 Unspecified osteoarthritis, unspecified site: Secondary | ICD-10-CM

## 2011-11-03 DIAGNOSIS — I119 Hypertensive heart disease without heart failure: Secondary | ICD-10-CM

## 2011-11-03 DIAGNOSIS — I10 Essential (primary) hypertension: Secondary | ICD-10-CM

## 2011-11-03 LAB — HEPATIC FUNCTION PANEL
AST: 29 U/L (ref 0–37)
Albumin: 4.5 g/dL (ref 3.5–5.2)
Alkaline Phosphatase: 38 U/L — ABNORMAL LOW (ref 39–117)
Total Protein: 6.7 g/dL (ref 6.0–8.3)

## 2011-11-03 LAB — LIPID PANEL
Cholesterol: 182 mg/dL (ref 0–200)
HDL: 54.2 mg/dL (ref >39.00)
LDL Cholesterol: 98 mg/dL (ref 0–99)
Triglycerides: 150 mg/dL — ABNORMAL HIGH (ref 0.0–149.0)

## 2011-11-03 LAB — BASIC METABOLIC PANEL
Calcium: 9.4 mg/dL (ref 8.4–10.5)
Creatinine, Ser: 0.8 mg/dL (ref 0.4–1.2)
Sodium: 139 mEq/L (ref 135–145)

## 2011-11-03 NOTE — Patient Instructions (Signed)
Will obtain labs today and call you with the results  Your physician recommends that you continue on your current medications as directed. Please refer to the Current Medication list given to you today.  Your physician wants you to follow-up in: 6 months You will receive a reminder letter in the mail two months in advance. If you don't receive a letter, please call our office to schedule the follow-up appointment.   

## 2011-11-03 NOTE — Assessment & Plan Note (Signed)
Patient has a history of osteoarthritis.  This has not been a severe problem recently and does not interfere with her activities of daily living.

## 2011-11-03 NOTE — Assessment & Plan Note (Signed)
The patient exercises daily by walking for exercise.  She's having no chest pain or shortness of breath.  Is not having any palpitations

## 2011-11-03 NOTE — Progress Notes (Signed)
[name] Date of Birth:  [birth year] [facility] / [facility] [address]   [address] [place], [place]  [address] [phone]           Fax   [phone]  HPI: This pleasant middle-aged woman is seen for a scheduled 6 month followup office visit.  She has a history of essential hypertension.  She has been under a lot of stress at home.  Her primary care provider recently gave her a prescription for Lexapro but she has not started it yet.  She also recently was evaluated at [facility] by a neurologist for a resting tremor of the left hand.  She was told that she has 2 out of the 4 criteria to make a diagnosis of Parkinson's.  No medication was prescribed yet.  She is to have a followup test for Parkinson's at [facility] in 6 months.  Current Outpatient Prescriptions  Medication Sig Dispense Refill  . Calcium-Vitamin D-Vitamin K (CALCIUM + D + K PO) Take 600 mg by mouth daily.        . Cholecalciferol (VITAMIN D3) 2000 UNITS TABS Take by mouth daily.        . Coenzyme Q10 (COQ10 PO) Take by mouth daily.        Marland Kitchen dextromethorphan-guaiFENesin (MUCINEX DM) 30-600 MG per 12 hr tablet Take 1 tablet by mouth as needed.        . diazepam (VALIUM) 5 MG tablet Taking 1/2 as needed or directed  30 tablet  3  . diclofenac (VOLTAREN) 50 MG EC tablet Take 50 mg by mouth 2 (two) times daily.       Marland Kitchen escitalopram (LEXAPRO) 10 MG tablet Take 1 tablet (10 mg total) by mouth daily.  30 tablet  2  . Glucosamine-Chondroitin (COSAMIN DS PO) Take by mouth daily.        . hydrochlorothiazide 25 MG tablet TAKE 1 TABLET DAILY  90 tablet  3  . Multiple Vitamin (MULTIVITAMIN) tablet Take 1 tablet by mouth daily.        Marland Kitchen PREMPRO 0.45-1.5 MG per tablet Take 1 tablet by mouth daily.         Allergies  Allergen Reactions  . Lodine (Etodolac)   . Penicillins     Patient Active Problem List  Diagnoses  . Abdominal pain, chronic, right upper quadrant  . Benign hypertensive  heart disease without heart failure  . HTN (hypertension)  . Depression  . Arthritis  . Tremor    History  Smoking status  . Former Smoker  . Quit date: 05/04/1966  Smokeless tobacco  . Not on file    History  Alcohol Use No    Family History  Problem Relation Age of Onset  . Arthritis Mother   . Arthritis Father   . Diabetes Sister     Review of Systems: The patient denies any heat or cold intolerance.  No weight gain or weight loss.  The patient denies headaches or blurry vision.  There is no cough or sputum production.  The patient denies dizziness.  There is no hematuria or hematochezia.  The patient denies any muscle aches or arthritis.  The patient denies any rash.  The patient denies frequent falling or instability.  There is no history of depression or anxiety.  All other systems were reviewed and are negative.   Physical Exam: Filed Vitals:   11/03/11 0836  BP: 118/80  Pulse: 80   The general appearance reveals a  well-developed well-nourished woman in no distress.  She does have a resting tremor of her outstretched left hand.The head and neck exam reveals pupils equal and reactive.  Extraocular movements are full.  There is no scleral icterus.  The mouth and pharynx are normal.  The neck is supple.  The carotids reveal no bruits.  The jugular venous pressure is normal.  The  thyroid is not enlarged.  There is no lymphadenopathy.  The chest is clear to percussion and auscultation.  There are no rales or rhonchi.  Expansion of the chest is symmetrical.  The precordium is quiet.  The first heart sound is normal.  The second heart sound is physiologically split.  There is no murmur gallop rub or click.  There is no abnormal lift or heave.  The abdomen is soft and nontender.  The bowel sounds are normal.  The liver and spleen are not enlarged.  There are no abdominal masses.  There are no abdominal bruits.  Extremities reveal good pedal pulses.  There is no phlebitis or edema.   There is no cyanosis or clubbing.  Strength is normal and symmetrical in all extremities.  There is no lateralizing weakness.  There are no sensory deficits.  The skin is warm and dry.  There is no rash.     Assessment / Plan: Continue same medication.  Recheck in 6 months.  Blood work today pending.

## 2011-11-05 ENCOUNTER — Telehealth: Payer: Self-pay | Admitting: *Deleted

## 2011-11-05 NOTE — Telephone Encounter (Signed)
Message copied by Burnell Blanks on Fri Nov 05, 2011  4:53 PM ------      Message from: Cassell Clement      Created: Thu Nov 04, 2011  1:21 PM       Please report.  The labs are stable.  Continue same meds.  Continue careful diet.  TGs are high.  Watch carbs in diet.

## 2011-11-05 NOTE — Telephone Encounter (Signed)
Advised patient and mailed copy

## 2011-11-24 ENCOUNTER — Encounter: Payer: Self-pay | Admitting: Family Medicine

## 2011-11-24 ENCOUNTER — Ambulatory Visit (INDEPENDENT_AMBULATORY_CARE_PROVIDER_SITE_OTHER): Payer: Medicare Other | Admitting: Family Medicine

## 2011-11-24 DIAGNOSIS — I10 Essential (primary) hypertension: Secondary | ICD-10-CM | POA: Diagnosis not present

## 2011-11-24 DIAGNOSIS — Z0000 Encounter for general adult medical examination without abnormal findings: Secondary | ICD-10-CM

## 2011-11-24 DIAGNOSIS — Z23 Encounter for immunization: Secondary | ICD-10-CM | POA: Diagnosis not present

## 2011-11-24 NOTE — Patient Instructions (Signed)
Follow up in 6 months- sooner if you need me You look fantastic!  Keep up the good work! Your recent labs all look great! Call with any questions or concerns Happy New Year!!!

## 2011-11-24 NOTE — Progress Notes (Signed)
  Subjective:    Patient ID: Stephanie [name], female    DOB: 01/21/1932, 78 y.o.   MRN: [id number]  HPI Here today for CPE.  Risk Factors: HTN- chronic problem, excellent control today.  On HCTZ daily. Physical Activity: walks 2 miles every morning Fall Risk: low risk Depression: chronic problem, reports as long as she is physically active she feels 'good' Hearing: normal to conversational tones, mildly diminished to whispered voice ADL's: independent Cognitive: normal linear thought process, memory and attention intact Home Safety: safe at home Height, Weight, BMI, Visual Acuity: see vitals, vision corrected to 20/20 w/ glasses and cataract surgery Counseling: UTD on GYN/DEXA [name]), Colonoscopy 6-7 yrs ago (Dr Juanda Chance) Labs Ordered: See A&P Care Plan: See A&P    Review of Systems Patient reports no vision/ hearing changes, adenopathy,fever, weight change,  persistant/recurrent hoarseness , swallowing issues, chest pain, palpitations, edema, persistant/recurrent cough, hemoptysis, dyspnea (rest/exertional/paroxysmal nocturnal), gastrointestinal bleeding (melena, rectal bleeding), abdominal pain, significant heartburn, bowel changes, GU symptoms (dysuria, hematuria, incontinence), Gyn symptoms (abnormal  bleeding, pain),  syncope, focal weakness, memory loss, numbness & tingling, skin/hair/nail changes, abnormal bruising or bleeding, anxiety, or depression.     Objective:   Physical Exam General Appearance:    Alert, cooperative, no distress, appears stated age  Head:    Normocephalic, without obvious abnormality, atraumatic  Eyes:    PERRL, conjunctiva/corneas clear, EOM's intact, fundi    benign, both eyes  Ears:    Normal TM's and external ear canals, both ears  Nose:   Nares normal, septum midline, mucosa normal, no drainage    or sinus tenderness  Throat:   Lips, mucosa, and tongue normal; teeth and gums normal  Neck:   Supple, symmetrical, trachea midline, no adenopathy;   Thyroid: no enlargement/tenderness/nodules  Back:     Symmetric, no curvature, ROM normal, no CVA tenderness  Lungs:     Clear to auscultation bilaterally, respirations unlabored  Chest Wall:    No tenderness or deformity   Heart:    Regular rate and rhythm, S1 and S2 normal, no murmur, rub   or gallop  Breast Exam:    Deferred to GYN  Abdomen:     Soft, non-tender, bowel sounds active all four quadrants,    no masses, no organomegaly  Genitalia:    Deferred to GYN  Rectal:    Extremities:   Extremities normal, atraumatic, no cyanosis or edema  Pulses:   2+ and symmetric all extremities  Skin:   Skin color, texture, turgor normal, no rashes or lesions  Lymph nodes:   Cervical, supraclavicular, and axillary nodes normal  Neurologic:   CNII-XII intact, normal strength, sensation and reflexes    Throughout, + resting tremor          Assessment & Plan:

## 2011-11-25 NOTE — Assessment & Plan Note (Signed)
Chronic problem.  Well controlled.  No changes. 

## 2011-11-25 NOTE — Assessment & Plan Note (Signed)
Pt's PE WNL.  UTD on health maintenance.  Reviewed recent labs from cards- look good.  Will give pneumovax to get pt UTD.  Anticipatory guidance provided.

## 2011-12-06 ENCOUNTER — Other Ambulatory Visit: Payer: Self-pay | Admitting: *Deleted

## 2011-12-06 DIAGNOSIS — F419 Anxiety disorder, unspecified: Secondary | ICD-10-CM

## 2011-12-06 NOTE — Telephone Encounter (Signed)
Pt left vm stating that she is new to [facility] per was with [facility] and wants a rx for Diazepam sent to [facility] on [address], last OV 11-24-11 noted last refill 05-14-11 #30 with 3 refills from [facility]

## 2011-12-07 MED ORDER — DIAZEPAM 5 MG PO TABS: ORAL_TABLET | ORAL | Status: DC

## 2011-12-07 NOTE — Telephone Encounter (Signed)
.  rx faxed to pharmacy, manually. Pt aware via phone

## 2011-12-07 NOTE — Telephone Encounter (Signed)
Ok for #30, 3 refills 

## 2011-12-14 DIAGNOSIS — G20 Parkinson's disease: Secondary | ICD-10-CM | POA: Diagnosis not present

## 2011-12-14 DIAGNOSIS — G20A1 Parkinson's disease without dyskinesia, without mention of fluctuations: Secondary | ICD-10-CM | POA: Diagnosis not present

## 2011-12-14 DIAGNOSIS — R259 Unspecified abnormal involuntary movements: Secondary | ICD-10-CM | POA: Diagnosis not present

## 2011-12-16 DIAGNOSIS — M19049 Primary osteoarthritis, unspecified hand: Secondary | ICD-10-CM | POA: Diagnosis not present

## 2012-01-28 DIAGNOSIS — Z1231 Encounter for screening mammogram for malignant neoplasm of breast: Secondary | ICD-10-CM | POA: Diagnosis not present

## 2012-02-15 ENCOUNTER — Encounter: Payer: Self-pay | Admitting: Family Medicine

## 2012-02-29 ENCOUNTER — Telehealth: Payer: Self-pay | Admitting: Cardiology

## 2012-02-29 NOTE — Telephone Encounter (Signed)
Spoke with patient and she stated that she received an EOB from her insurance about June 2012 labs.  She called her insurance company and was told to call our office and that this had to be recertified. Explained I didn't know anything about billing and would forward to Beola Cord in billing and have her look into this.

## 2012-02-29 NOTE — Telephone Encounter (Signed)
New msg Pt was calling about bill she received from [employer]. Please call her back

## 2012-03-09 DIAGNOSIS — H35379 Puckering of macula, unspecified eye: Secondary | ICD-10-CM | POA: Insufficient documentation

## 2012-03-09 DIAGNOSIS — Z961 Presence of intraocular lens: Secondary | ICD-10-CM | POA: Insufficient documentation

## 2012-03-09 DIAGNOSIS — H11829 Conjunctivochalasis, unspecified eye: Secondary | ICD-10-CM | POA: Insufficient documentation

## 2012-03-09 DIAGNOSIS — H5200 Hypermetropia, unspecified eye: Secondary | ICD-10-CM | POA: Insufficient documentation

## 2012-03-10 DIAGNOSIS — H02839 Dermatochalasis of unspecified eye, unspecified eyelid: Secondary | ICD-10-CM | POA: Diagnosis not present

## 2012-03-10 DIAGNOSIS — H35379 Puckering of macula, unspecified eye: Secondary | ICD-10-CM | POA: Diagnosis not present

## 2012-03-10 DIAGNOSIS — H524 Presbyopia: Secondary | ICD-10-CM | POA: Diagnosis not present

## 2012-03-10 DIAGNOSIS — H5200 Hypermetropia, unspecified eye: Secondary | ICD-10-CM | POA: Diagnosis not present

## 2012-03-10 DIAGNOSIS — Z961 Presence of intraocular lens: Secondary | ICD-10-CM | POA: Diagnosis not present

## 2012-03-10 DIAGNOSIS — H52209 Unspecified astigmatism, unspecified eye: Secondary | ICD-10-CM | POA: Diagnosis not present

## 2012-05-08 DIAGNOSIS — Z124 Encounter for screening for malignant neoplasm of cervix: Secondary | ICD-10-CM | POA: Diagnosis not present

## 2012-05-08 DIAGNOSIS — Z01419 Encounter for gynecological examination (general) (routine) without abnormal findings: Secondary | ICD-10-CM | POA: Diagnosis not present

## 2012-05-17 ENCOUNTER — Other Ambulatory Visit: Payer: Self-pay | Admitting: *Deleted

## 2012-05-17 DIAGNOSIS — I119 Hypertensive heart disease without heart failure: Secondary | ICD-10-CM

## 2012-05-24 ENCOUNTER — Encounter: Payer: Self-pay | Admitting: Family Medicine

## 2012-05-24 ENCOUNTER — Ambulatory Visit (INDEPENDENT_AMBULATORY_CARE_PROVIDER_SITE_OTHER): Payer: Medicare Other | Admitting: Family Medicine

## 2012-05-24 ENCOUNTER — Ambulatory Visit: Payer: Medicare Other | Admitting: Family Medicine

## 2012-05-24 VITALS — BP 124/84 | HR 71 | Temp 98.1°F | Ht 65.0 in | Wt 124.0 lb

## 2012-05-24 DIAGNOSIS — F3289 Other specified depressive episodes: Secondary | ICD-10-CM | POA: Diagnosis not present

## 2012-05-24 DIAGNOSIS — I10 Essential (primary) hypertension: Secondary | ICD-10-CM

## 2012-05-24 DIAGNOSIS — F419 Anxiety disorder, unspecified: Secondary | ICD-10-CM

## 2012-05-24 DIAGNOSIS — F411 Generalized anxiety disorder: Secondary | ICD-10-CM | POA: Diagnosis not present

## 2012-05-24 DIAGNOSIS — G20 Parkinson's disease: Secondary | ICD-10-CM | POA: Insufficient documentation

## 2012-05-24 DIAGNOSIS — F329 Major depressive disorder, single episode, unspecified: Secondary | ICD-10-CM | POA: Diagnosis not present

## 2012-05-24 DIAGNOSIS — F32A Depression, unspecified: Secondary | ICD-10-CM

## 2012-05-24 MED ORDER — DIAZEPAM 5 MG PO TABS: ORAL_TABLET | ORAL | Status: DC

## 2012-05-24 NOTE — Patient Instructions (Signed)
Schedule your complete physical for January You look great!  Keep up the good work! Call with any questions or concerns Have a great summer!!

## 2012-05-24 NOTE — Progress Notes (Signed)
  Subjective:    Patient ID: Stephanie [name], female    DOB: 10-26-1932, 78 y.o.   MRN: [id number]  HPI HTN- chronic problem, good control on HCTZ.  Has upcoming appt w/ Dr Patty Sermons to get labs done.  No CP, SOB, HAs, visual changes, edema.  Still walking daily.   Depression- needs refill on Valium for depression.  Taking 1/2 tab nightly for bed.  No longer taking lexapro- feels sxs are well controlled w/out it.  Parkinson's- recently dx'd by Dr Rubin Payor at [facility]  Not currently on meds.  Other than slight tremor of L hand, asymptomatic.  Continuing to follow w/ neuro.  Had recent pap w/ Dr [name], mammo.  Colonoscopy and DEXA due next year.   Review of Systems For ROS see HPI     Objective:   Physical Exam  Constitutional: She is oriented to person, place, and time. She appears well-developed and well-nourished. No distress.  HENT:  Head: Normocephalic and atraumatic.  Eyes: Conjunctivae and EOM are normal. Pupils are equal, round, and reactive to light.  Neck: Normal range of motion. Neck supple. No thyromegaly present.  Cardiovascular: Normal rate, regular rhythm, normal heart sounds and intact distal pulses.   No murmur heard. Pulmonary/Chest: Effort normal and breath sounds normal. No respiratory distress.  Abdominal: Soft. She exhibits no distension. There is no tenderness.  Musculoskeletal: She exhibits no edema.  Lymphadenopathy:    She has no cervical adenopathy.  Neurological: She is alert and oriented to person, place, and time.       Faint tremor of L hand at rest  Skin: Skin is warm and dry.  Psychiatric: She has a normal mood and affect. Her behavior is normal.          Assessment & Plan:

## 2012-05-24 NOTE — Assessment & Plan Note (Signed)
New.  dx'd in January by neuro at [facility].  Not currently on meds and doesn't plan on starting them unless tremor becomes more bothersome or interferes w/ activities.  Will follow.

## 2012-05-24 NOTE — Assessment & Plan Note (Signed)
Chronic problem.  Well controlled.  Asymptomatic.  No changes.  Will defer labs to upcoming appt w/ Dr Patty Sermons

## 2012-05-24 NOTE — Assessment & Plan Note (Signed)
Stable.  Refill on Valium provided.  Doing well off Lexapro.  Will continue to follow.

## 2012-06-05 ENCOUNTER — Encounter: Payer: Self-pay | Admitting: Cardiology

## 2012-06-05 ENCOUNTER — Ambulatory Visit (INDEPENDENT_AMBULATORY_CARE_PROVIDER_SITE_OTHER): Payer: Medicare Other | Admitting: Cardiology

## 2012-06-05 ENCOUNTER — Other Ambulatory Visit (INDEPENDENT_AMBULATORY_CARE_PROVIDER_SITE_OTHER): Payer: Medicare Other

## 2012-06-05 VITALS — BP 126/78 | HR 80 | Ht 64.5 in | Wt 125.0 lb

## 2012-06-05 DIAGNOSIS — I119 Hypertensive heart disease without heart failure: Secondary | ICD-10-CM

## 2012-06-05 LAB — HEPATIC FUNCTION PANEL
ALT: 17 U/L (ref 0–35)
AST: 28 U/L (ref 0–37)
Bilirubin, Direct: 0.1 mg/dL (ref 0.0–0.3)
Total Bilirubin: 0.8 mg/dL (ref 0.3–1.2)

## 2012-06-05 LAB — LIPID PANEL
Cholesterol: 178 mg/dL (ref 0–200)
Total CHOL/HDL Ratio: 4

## 2012-06-05 LAB — LDL CHOLESTEROL, DIRECT: Direct LDL: 92.4 mg/dL

## 2012-06-05 LAB — BASIC METABOLIC PANEL
BUN: 15 mg/dL (ref 6–23)
Chloride: 98 mEq/L (ref 96–112)
Potassium: 4.1 mEq/L (ref 3.5–5.1)

## 2012-06-05 NOTE — Assessment & Plan Note (Signed)
The patient has had good blood pressure readings at home.  She has not been experiencing any headaches or dizzy spells.  She's had no chest pain or shortness of breath.  Her energy level has been good and she walks 2 miles a day.  She previously had been losing a lot of weight because of stress at this time her weight is back up 4 pounds and she feels better.  She had a recent GYN checkup and got a good report and including a normal breast exam.  She is on long-term Prempro.

## 2012-06-05 NOTE — Progress Notes (Signed)
Quick Note:  Please report to patient. The recent labs are stable. Continue same medication and careful diet.Labs are all good except TG are too high. Needs to avoid sweets and carbs. BS 94 okay. ______

## 2012-06-05 NOTE — Patient Instructions (Addendum)
Your physician recommends that you continue on your current medications as directed. Please refer to the Current Medication list given to you today.  Your physician wants you to follow-up in: 6 months. You will receive a reminder letter in the mail two months in advance. If you don't receive a letter, please call our office to schedule the follow-up appointment.  

## 2012-06-05 NOTE — Progress Notes (Signed)
[name] Date of Birth:  01/31/1932 [facility] [address] [place], [place]  [address] [phone]  Fax   [phone]  HPI: This pleasant 78 year old woman is seen for a six-month followup office visit.  She has a past history of essential hypertension.  She also has recently been diagnosed with early Parkinson's disease by her neurologist at [facility].  She does not require any Parkinson's medication at this point.  She does admit to a lot of emotional stress at home.  Current Outpatient Prescriptions  Medication Sig Dispense Refill  . Calcium-Vitamin D-Vitamin K (CALCIUM + D + K PO) Take 600 mg by mouth daily.        . Cholecalciferol (VITAMIN D3) 2000 UNITS TABS Take by mouth daily.        . Coenzyme Q10 (COQ10 PO) Take by mouth daily.        Marland Kitchen dextromethorphan-guaiFENesin (MUCINEX DM) 30-600 MG per 12 hr tablet Take 1 tablet by mouth as needed.        . diazepam (VALIUM) 5 MG tablet Taking 1/2 as needed or directed  30 tablet  3  . diclofenac (VOLTAREN) 50 MG EC tablet Take 50 mg by mouth daily.       . Glucosamine-Chondroitin (COSAMIN DS PO) Take by mouth daily.        . hydrochlorothiazide 25 MG tablet TAKE 1 TABLET DAILY  90 tablet  3  . Multiple Vitamin (MULTIVITAMIN) tablet Take 1 tablet by mouth daily.        Marland Kitchen PREMPRO 0.45-1.5 MG per tablet Take 1 tablet by mouth daily.         Allergies  Allergen Reactions  . Lodine (Etodolac)   . Penicillins     Patient Active Problem List  Diagnosis  . Abdominal pain, chronic, right upper quadrant  . Benign hypertensive heart disease without heart failure  . HTN (hypertension)  . Depression  . Arthritis  . Tremor  . General medical examination  . Parkinson's disease    History  Smoking status  . Former Smoker  . Quit date: 05/04/1966  Smokeless tobacco  . Not on file    History  Alcohol Use No    Family History  Problem Relation Age of Onset  . Arthritis Mother   .  Arthritis Father   . Diabetes Sister     Review of Systems: The patient denies any heat or cold intolerance.  No weight gain or weight loss.  The patient denies headaches or blurry vision.  There is no cough or sputum production.  The patient denies dizziness.  There is no hematuria or hematochezia.  The patient denies any muscle aches or arthritis.  The patient denies any rash.  The patient denies frequent falling or instability.  There is no history of depression or anxiety.  All other systems were reviewed and are negative.   Physical Exam: Filed Vitals:   06/05/12 1009  BP: 126/78  Pulse: 80   the general appearance reveals an alert pleasant woman in no distress.The head and neck exam reveals pupils equal and reactive.  Extraocular movements are full.  There is no scleral icterus.  The mouth and pharynx are normal.  The neck is supple.  The carotids reveal no bruits.  The jugular venous pressure is normal.  The  thyroid is not enlarged.  There is no lymphadenopathy.  The chest is clear to percussion and auscultation.  There are no rales or rhonchi.  Expansion  of the chest is symmetrical.  The precordium is quiet.  The first heart sound is normal.  The second heart sound is physiologically split.  There is no murmur gallop rub or click.  There is no abnormal lift or heave.  The abdomen is soft and nontender.  The bowel sounds are normal.  The liver and spleen are not enlarged.  There are no abdominal masses.  There are no abdominal bruits.  Extremities reveal good pedal pulses.  There is no phlebitis or edema.  There is no cyanosis or clubbing.  Strength is normal and symmetrical in all extremities.  She has a very minimal tremor of her left arm at rest.  There is no lateralizing weakness.  There are no sensory deficits.  The skin is warm and dry.  There is no rash.      Assessment / Plan: Continue same medication.  Patient is doing well.  Recheck in 6 months for followup office visit.  We are  checking lab work today.

## 2012-06-06 DIAGNOSIS — M19049 Primary osteoarthritis, unspecified hand: Secondary | ICD-10-CM | POA: Diagnosis not present

## 2012-06-07 ENCOUNTER — Telehealth: Payer: Self-pay | Admitting: *Deleted

## 2012-06-07 NOTE — Telephone Encounter (Signed)
Message copied by Burnell Blanks on Wed Jun 07, 2012  9:27 AM ------      Message from: Cassell Clement      Created: Mon Jun 05, 2012  4:32 PM       Please report to patient.  The recent labs are stable. Continue same medication and careful diet.Labs are all good except TG are too high.  Needs to avoid sweets and carbs.  BS 94 okay.

## 2012-06-07 NOTE — Telephone Encounter (Signed)
Advised patient of lab results  

## 2012-06-30 DIAGNOSIS — M216X9 Other acquired deformities of unspecified foot: Secondary | ICD-10-CM | POA: Diagnosis not present

## 2012-06-30 DIAGNOSIS — Q6689 Other  specified congenital deformities of feet: Secondary | ICD-10-CM | POA: Diagnosis not present

## 2012-06-30 DIAGNOSIS — M25579 Pain in unspecified ankle and joints of unspecified foot: Secondary | ICD-10-CM | POA: Diagnosis not present

## 2012-07-24 ENCOUNTER — Other Ambulatory Visit: Payer: Self-pay | Admitting: *Deleted

## 2012-07-24 DIAGNOSIS — I119 Hypertensive heart disease without heart failure: Secondary | ICD-10-CM

## 2012-07-24 MED ORDER — HYDROCHLOROTHIAZIDE 25 MG PO TABS: 25.0000 mg | ORAL_TABLET | Freq: Every day | ORAL | Status: DC

## 2012-07-24 NOTE — Telephone Encounter (Signed)
Refilled hctz 

## 2012-08-18 DIAGNOSIS — L84 Corns and callosities: Secondary | ICD-10-CM | POA: Diagnosis not present

## 2012-09-05 DIAGNOSIS — M6740 Ganglion, unspecified site: Secondary | ICD-10-CM | POA: Diagnosis not present

## 2012-09-05 DIAGNOSIS — M19049 Primary osteoarthritis, unspecified hand: Secondary | ICD-10-CM | POA: Diagnosis not present

## 2012-11-24 ENCOUNTER — Telehealth: Payer: Self-pay | Admitting: Family Medicine

## 2012-11-24 ENCOUNTER — Encounter: Payer: Self-pay | Admitting: *Deleted

## 2012-11-24 DIAGNOSIS — F419 Anxiety disorder, unspecified: Secondary | ICD-10-CM

## 2012-11-24 MED ORDER — DIAZEPAM 5 MG PO TABS: ORAL_TABLET | ORAL | Status: DC

## 2012-11-24 NOTE — Telephone Encounter (Signed)
Ok for #30, 3 refills.  needs to sign agreement and do UDS if not already done- has appt on Monday

## 2012-11-24 NOTE — Telephone Encounter (Signed)
Refill: Diazepam tab 5 mg. [facility]

## 2012-11-24 NOTE — Telephone Encounter (Signed)
Left message to call office to advise Pt that Rx will need to be pick up and to sign agreement.

## 2012-11-24 NOTE — Telephone Encounter (Signed)
05-24-12--Last OV, Last filled 05-24-12 #30 3

## 2012-11-27 ENCOUNTER — Encounter: Payer: Self-pay | Admitting: Family Medicine

## 2012-11-27 ENCOUNTER — Ambulatory Visit (INDEPENDENT_AMBULATORY_CARE_PROVIDER_SITE_OTHER): Payer: Medicare Other | Admitting: Family Medicine

## 2012-11-27 VITALS — BP 138/70 | HR 73 | Temp 97.6°F | Ht 63.75 in | Wt 125.6 lb

## 2012-11-27 DIAGNOSIS — I10 Essential (primary) hypertension: Secondary | ICD-10-CM

## 2012-11-27 DIAGNOSIS — M47812 Spondylosis without myelopathy or radiculopathy, cervical region: Secondary | ICD-10-CM

## 2012-11-27 DIAGNOSIS — M62838 Other muscle spasm: Secondary | ICD-10-CM | POA: Insufficient documentation

## 2012-11-27 DIAGNOSIS — Z0000 Encounter for general adult medical examination without abnormal findings: Secondary | ICD-10-CM | POA: Diagnosis not present

## 2012-11-27 DIAGNOSIS — Z79899 Other long term (current) drug therapy: Secondary | ICD-10-CM | POA: Diagnosis not present

## 2012-11-27 HISTORY — DX: Spondylosis without myelopathy or radiculopathy, cervical region: M47.812

## 2012-11-27 LAB — LIPID PANEL
HDL: 39 mg/dL — ABNORMAL LOW (ref >39.00)
LDL Cholesterol: 72 mg/dL (ref 0–99)
Total CHOL/HDL Ratio: 3
Triglycerides: 126 mg/dL (ref 0.0–149.0)
VLDL: 25.2 mg/dL (ref 0.0–40.0)

## 2012-11-27 LAB — CBC WITH DIFFERENTIAL/PLATELET
Basophils Relative: 0.2 % (ref 0.0–3.0)
Eosinophils Relative: 1.1 % (ref 0.0–5.0)
Hemoglobin: 12.6 g/dL (ref 12.0–15.0)
Lymphocytes Relative: 11.7 % — ABNORMAL LOW (ref 12.0–46.0)
Monocytes Relative: 9.1 % (ref 3.0–12.0)
Neutro Abs: 5.3 10*3/uL (ref 1.4–7.7)
RBC: 4.16 Mil/uL (ref 3.87–5.11)

## 2012-11-27 LAB — BASIC METABOLIC PANEL
BUN: 18 mg/dL (ref 6–23)
CO2: 29 mEq/L (ref 19–32)
Chloride: 93 mEq/L — ABNORMAL LOW (ref 96–112)
Creatinine, Ser: 0.6 mg/dL (ref 0.4–1.2)

## 2012-11-27 LAB — HEPATIC FUNCTION PANEL
Alkaline Phosphatase: 44 U/L (ref 39–117)
Bilirubin, Direct: 0.1 mg/dL (ref 0.0–0.3)
Total Bilirubin: 0.7 mg/dL (ref 0.3–1.2)
Total Protein: 6.2 g/dL (ref 6.0–8.3)

## 2012-11-27 LAB — TSH: TSH: 0.53 u[IU]/mL (ref 0.35–5.50)

## 2012-11-27 NOTE — Assessment & Plan Note (Signed)
Pt's PE WNL w/ exception of mild parkinson's tremor.  UTD on health maintenance.  Check labs.  EKG done- see document for interpretation.  Anticipatory guidance provided.

## 2012-11-27 NOTE — Progress Notes (Signed)
  Subjective:    Patient ID: Stephanie [name], female    DOB: 04/07/1932, 79 y.o.   MRN: [id number]  HPI Here today for CPE.  Risk Factors: HTN- chronic problem, slightly elevated today but pt currently in pain from dental infxn (following w/ endodontist).  On HCTZ.  Denies CP, SOB, HAs, visual changes, edema Neck pain- R sided x6-8 months.  Pain now radiating up into head and into shoulder.  Hx of arthritis.  Able to sleep on R side comfortably.  Some tenderness to palpitation.  Reports soreness is worse in the AM and improves as day goes on.  Trying to do stretching and ROM exercises. Physical Activity:  Walks regularly Fall Risk: increased risk due to Parkinson's but no hx of falls Depression: chronic problem, pt reports sxs are well controlled. Hearing: normal to conversational tones and whispered voice ADL's: independent Cognitive: normal linear thought process, memory and attention intact Home Safety: feeling safe at home, lives w/ husband Height, Weight, BMI, Visual Acuity: see vitals, vision corrected to 20/20 w/ glasses Counseling: due for colonoscopy next year, UTD on mammo, GYN indicated DEXA due next year (still seeing Dr [name]) Labs Ordered: See A&P Care Plan: See A&P    Review of Systems Patient reports no vision/ hearing changes, adenopathy,fever, weight change,  persistant/recurrent hoarseness , swallowing issues, chest pain, palpitations, edema, persistant/recurrent cough, hemoptysis, dyspnea (rest/exertional/paroxysmal nocturnal), gastrointestinal bleeding (melena, rectal bleeding), abdominal pain, significant heartburn, bowel changes, GU symptoms (dysuria, hematuria, incontinence), Gyn symptoms (abnormal  bleeding, pain),  syncope, focal weakness, memory loss, numbness & tingling, hair/nail changes, abnormal bruising or bleeding, anxiety, or depression.   + dry skin in L nasolabial fold     Objective:   Physical Exam General Appearance:    Alert, cooperative, no  distress, appears stated age  Head:    Normocephalic, without obvious abnormality, atraumatic  Eyes:    PERRL, conjunctiva/corneas clear, EOM's intact, fundi    benign, both eyes  Ears:    Normal TM's and external ear canals, both ears  Nose:   Nares normal, septum midline, mucosa normal, no drainage    or sinus tenderness  Throat:   Lips, mucosa, and tongue normal; teeth and gums normal  Neck:   Supple, symmetrical, trachea midline, no adenopathy;    Thyroid: no enlargement/tenderness/nodules  Back:     Symmetric, no curvature, ROM normal, no CVA tenderness  Lungs:     Clear to auscultation bilaterally, respirations unlabored  Chest Wall:    No tenderness or deformity   Heart:    Regular rate and rhythm, S1 and S2 normal, no murmur, rub   or gallop  Breast Exam:    Deferred to GYN  Abdomen:     Soft, non-tender, bowel sounds active all four quadrants,    no masses, no organomegaly  Genitalia:    Deferred to GYN  Rectal:    Extremities:   Extremities normal, atraumatic, no cyanosis or edema.  + R trap spasm  Pulses:   2+ and symmetric all extremities  Skin:   Skin color, texture, turgor normal, no rashes or lesions.  + dry skin in L nasolabial fold w/out inflammation or infxn  Lymph nodes:   Cervical, supraclavicular, and axillary nodes normal  Neurologic:   CNII-XII intact, normal strength, sensation and reflexes    throughout.  Pill rolling tremor on L          Assessment & Plan:

## 2012-11-27 NOTE — Patient Instructions (Addendum)
Follow up in 6 months to recheck BP Keep up the good work- you look great! Your neck pain is due to muscle spasm.  Heating pad will help, as will the Diazepam We'll notify you of your lab results Call with any questions or concerns Happy Early Birthday!

## 2012-11-27 NOTE — Assessment & Plan Note (Signed)
New.  Pt to start heating pad for pain relief.  Already has Diazepam which will relax muscle.  Continue stretching and ROM exercises.  Reviewed supportive care and red flags that should prompt return.  Pt expressed understanding and is in agreement w/ plan.

## 2012-11-27 NOTE — Assessment & Plan Note (Signed)
Chronic problem.  Adequate control.  Asymptomatic.  No med changes at this time.  Check labs.

## 2012-12-01 NOTE — Telephone Encounter (Signed)
Spoke with the pt and she has picked up the RX and has signed an agreement.//AB/CMA

## 2012-12-11 ENCOUNTER — Other Ambulatory Visit (INDEPENDENT_AMBULATORY_CARE_PROVIDER_SITE_OTHER): Payer: Medicare Other

## 2012-12-11 DIAGNOSIS — E871 Hypo-osmolality and hyponatremia: Secondary | ICD-10-CM

## 2012-12-11 LAB — BASIC METABOLIC PANEL
BUN: 18 mg/dL (ref 6–23)
Calcium: 9.6 mg/dL (ref 8.4–10.5)
Chloride: 97 mEq/L (ref 96–112)
Creatinine, Ser: 0.7 mg/dL (ref 0.4–1.2)

## 2012-12-11 LAB — HM DEXA SCAN: HM Dexa Scan: NORMAL

## 2012-12-12 ENCOUNTER — Encounter: Payer: Self-pay | Admitting: *Deleted

## 2013-01-02 ENCOUNTER — Encounter: Payer: Self-pay | Admitting: Family Medicine

## 2013-01-09 ENCOUNTER — Ambulatory Visit (INDEPENDENT_AMBULATORY_CARE_PROVIDER_SITE_OTHER): Payer: Medicare Other | Admitting: Family Medicine

## 2013-01-09 ENCOUNTER — Encounter: Payer: Self-pay | Admitting: Family Medicine

## 2013-01-09 VITALS — BP 112/72 | HR 75 | Temp 98.6°F | Wt 124.0 lb

## 2013-01-09 DIAGNOSIS — J019 Acute sinusitis, unspecified: Secondary | ICD-10-CM

## 2013-01-09 DIAGNOSIS — R059 Cough, unspecified: Secondary | ICD-10-CM

## 2013-01-09 DIAGNOSIS — R05 Cough: Secondary | ICD-10-CM | POA: Diagnosis not present

## 2013-01-09 MED ORDER — CLARITHROMYCIN ER 500 MG PO TB24: 1000.0000 mg | ORAL_TABLET | Freq: Every day | ORAL | Status: AC

## 2013-01-09 MED ORDER — GUAIFENESIN-CODEINE 100-10 MG/5ML PO SYRP: ORAL_SOLUTION | ORAL | Status: DC

## 2013-01-09 MED ORDER — MOMETASONE FUROATE 50 MCG/ACT NA SUSP: 2.0000 | Freq: Every day | NASAL | Status: DC

## 2013-01-09 NOTE — Patient Instructions (Signed)

## 2013-01-09 NOTE — Progress Notes (Signed)
  Subjective:     Stephanie [name] is a 79 y.o. female who presents for evaluation of sinus pain. Symptoms include: congestion, facial pain, headaches, nasal congestion and sinus pressure. Onset of symptoms was 1 week ago. Symptoms have been gradually worsening since that time. Past history is significant for no history of pneumonia or bronchitis. Patient is a non-smoker.  The following portions of the patient's history were reviewed and updated as appropriate: allergies, current medications, past family history, past medical history, past social history, past surgical history and problem list.  Review of Systems Pertinent items are noted in HPI.   Objective:    BP 112/72  Pulse 75  Temp(Src) 98.6 F (37 C) (Oral)  Wt 124 lb (56.246 kg)  BMI 21.46 kg/m2  SpO2 98% General appearance: alert, cooperative, appears stated age and no distress Ears: normal TM's and external ear canals both ears Nose: green discharge, moderate congestion, turbinates red, swollen, sinus tenderness bilateral Throat: lips, mucosa, and tongue normal; teeth and gums normal Neck: moderate anterior cervical adenopathy, supple, symmetrical, trachea midline and thyroid not enlarged, symmetric, no tenderness/mass/nodules Lungs: clear to auscultation bilaterally Heart: S1, S2 normal    Assessment:    Acute bacterial sinusitis.    Plan:    Nasal steroids per medication orders. Antihistamines per medication orders. Biaxin per medication orders.

## 2013-01-29 DIAGNOSIS — N950 Postmenopausal bleeding: Secondary | ICD-10-CM | POA: Diagnosis not present

## 2013-01-29 DIAGNOSIS — N924 Excessive bleeding in the premenopausal period: Secondary | ICD-10-CM | POA: Diagnosis not present

## 2013-01-29 DIAGNOSIS — N83339 Acquired atrophy of ovary and fallopian tube, unspecified side: Secondary | ICD-10-CM | POA: Diagnosis not present

## 2013-01-29 DIAGNOSIS — N859 Noninflammatory disorder of uterus, unspecified: Secondary | ICD-10-CM | POA: Diagnosis not present

## 2013-01-31 DIAGNOSIS — Z1231 Encounter for screening mammogram for malignant neoplasm of breast: Secondary | ICD-10-CM | POA: Diagnosis not present

## 2013-02-01 ENCOUNTER — Telehealth: Payer: Self-pay | Admitting: *Deleted

## 2013-02-01 NOTE — Telephone Encounter (Signed)
Pt notified of endo bx results and mmg results

## 2013-02-01 NOTE — Telephone Encounter (Signed)
02/01/13 @ 11:05 LMTCB  Regarding Endometrial Biopsy Results on Paper

## 2013-02-01 NOTE — Telephone Encounter (Signed)
Pt returning jasmine's call °

## 2013-02-22 DIAGNOSIS — M19049 Primary osteoarthritis, unspecified hand: Secondary | ICD-10-CM | POA: Diagnosis not present

## 2013-04-04 DIAGNOSIS — Z961 Presence of intraocular lens: Secondary | ICD-10-CM | POA: Diagnosis not present

## 2013-04-04 DIAGNOSIS — H35379 Puckering of macula, unspecified eye: Secondary | ICD-10-CM | POA: Diagnosis not present

## 2013-04-04 DIAGNOSIS — H02839 Dermatochalasis of unspecified eye, unspecified eyelid: Secondary | ICD-10-CM | POA: Diagnosis not present

## 2013-04-04 DIAGNOSIS — H11829 Conjunctivochalasis, unspecified eye: Secondary | ICD-10-CM | POA: Diagnosis not present

## 2013-04-04 DIAGNOSIS — H5200 Hypermetropia, unspecified eye: Secondary | ICD-10-CM | POA: Diagnosis not present

## 2013-05-07 ENCOUNTER — Ambulatory Visit (INDEPENDENT_AMBULATORY_CARE_PROVIDER_SITE_OTHER): Payer: Medicare Other | Admitting: Family Medicine

## 2013-05-07 ENCOUNTER — Encounter: Payer: Self-pay | Admitting: Family Medicine

## 2013-05-07 VITALS — BP 124/78 | HR 80 | Temp 98.4°F | Wt 124.4 lb

## 2013-05-07 DIAGNOSIS — F3289 Other specified depressive episodes: Secondary | ICD-10-CM | POA: Diagnosis not present

## 2013-05-07 DIAGNOSIS — R5381 Other malaise: Secondary | ICD-10-CM

## 2013-05-07 DIAGNOSIS — F32A Depression, unspecified: Secondary | ICD-10-CM

## 2013-05-07 DIAGNOSIS — R5383 Other fatigue: Secondary | ICD-10-CM | POA: Insufficient documentation

## 2013-05-07 DIAGNOSIS — F329 Major depressive disorder, single episode, unspecified: Secondary | ICD-10-CM

## 2013-05-07 NOTE — Progress Notes (Signed)
  Subjective:    Patient ID: Stephanie [name], female    DOB: [birth year], 79 y.o.   MRN: [id number]  HPI 'i'm just worn out'- 'i don't think it's a deep depression, i mean i don't walk around crying all the time'.  Tearful in office.  Sleeping 8 hrs nightly.  Waking up tired, 'i don't feel like getting up'.  Has parkinson's disease.  Husband is falling a lot- has advanced Parkinson's.  Pt would like to visit family in [place].   Review of Systems For ROS see HPI     Objective:   Physical Exam  Vitals reviewed. Constitutional: She is oriented to person, place, and time. She appears well-developed and well-nourished. No distress.  HENT:  Head: Normocephalic and atraumatic.  Eyes: Conjunctivae and EOM are normal. Pupils are equal, round, and reactive to light.  Neck: Normal range of motion. Neck supple. No thyromegaly present.  Cardiovascular: Normal rate, regular rhythm, normal heart sounds and intact distal pulses.   No murmur heard. Pulmonary/Chest: Effort normal and breath sounds normal. No respiratory distress.  Abdominal: Soft. She exhibits no distension. There is no tenderness.  Musculoskeletal: She exhibits no edema.  Lymphadenopathy:    She has no cervical adenopathy.  Neurological: She is alert and oriented to person, place, and time.  Skin: Skin is warm and dry.  Psychiatric: Her behavior is normal.  Tearful, anxious          Assessment & Plan:

## 2013-05-07 NOTE — Assessment & Plan Note (Signed)
Deteriorated.  Encouraged her to consider SSRI to improve sxs.  Will discuss once labs available to review.  Pt expressed understanding and is in agreement w/ plan.

## 2013-05-07 NOTE — Assessment & Plan Note (Signed)
New.  Suspect this is all depression driven- whether caregiver associated or Parkinson's depression.  Will check labs to r/o underlying metabolic causes such as anemia, hypothyroid, electrolyte disturbance.  Encouraged pt to seek Hospice services for husband in order to get respite care.  Also discussed starting low dose SSRI in order to improve motivation and energy level.  Will follow closely.

## 2013-05-07 NOTE — Patient Instructions (Addendum)
Follow up in 1 month to recheck fatigue/mood We'll notify you of your lab results and make any changes if needed Consider a low dose depression med to help get your energy and motivation back Call with any questions or concerns Hang in there!!!

## 2013-05-08 LAB — BASIC METABOLIC PANEL
Calcium: 9.7 mg/dL (ref 8.4–10.5)
Chloride: 100 mEq/L (ref 96–112)
Creatinine, Ser: 0.8 mg/dL (ref 0.4–1.2)

## 2013-05-08 LAB — CBC WITH DIFFERENTIAL/PLATELET
Basophils Absolute: 0 10*3/uL (ref 0.0–0.1)
Eosinophils Absolute: 0.1 10*3/uL (ref 0.0–0.7)
Hemoglobin: 14.1 g/dL (ref 12.0–15.0)
Lymphocytes Relative: 15.2 % (ref 12.0–46.0)
Lymphs Abs: 1 10*3/uL (ref 0.7–4.0)
MCHC: 33.7 g/dL (ref 30.0–36.0)
Neutro Abs: 4.8 10*3/uL (ref 1.4–7.7)
RDW: 13.1 % (ref 11.5–14.6)

## 2013-05-08 LAB — TSH: TSH: 1 u[IU]/mL (ref 0.35–5.50)

## 2013-05-16 ENCOUNTER — Telehealth: Payer: Self-pay | Admitting: *Deleted

## 2013-05-16 MED ORDER — SERTRALINE HCL 25 MG PO TABS: 25.0000 mg | ORAL_TABLET | Freq: Every day | ORAL | Status: DC

## 2013-05-16 NOTE — Telephone Encounter (Signed)
Message copied by Nada Maclachlan on Wed May 16, 2013  4:58 PM ------      Message from: Sheliah Hatch      Created: Wed May 16, 2013  7:51 AM       Zoloft 25mg  daily, #30, 3 refills.  Needs to schedule f/u OV in 4-6 weeks ------

## 2013-05-16 NOTE — Telephone Encounter (Signed)
Discussed with pt, rx sent to pharmacy.

## 2013-05-28 ENCOUNTER — Ambulatory Visit: Payer: BLUE CROSS/BLUE SHIELD | Admitting: Family Medicine

## 2013-05-30 ENCOUNTER — Ambulatory Visit (INDEPENDENT_AMBULATORY_CARE_PROVIDER_SITE_OTHER): Payer: Medicare Other | Admitting: Family Medicine

## 2013-05-30 ENCOUNTER — Encounter: Payer: Self-pay | Admitting: Family Medicine

## 2013-05-30 VITALS — BP 138/78 | HR 63 | Temp 98.1°F | Ht 63.75 in | Wt 124.0 lb

## 2013-05-30 DIAGNOSIS — IMO0001 Reserved for inherently not codable concepts without codable children: Secondary | ICD-10-CM | POA: Diagnosis not present

## 2013-05-30 DIAGNOSIS — F329 Major depressive disorder, single episode, unspecified: Secondary | ICD-10-CM

## 2013-05-30 DIAGNOSIS — F3289 Other specified depressive episodes: Secondary | ICD-10-CM

## 2013-05-30 DIAGNOSIS — F32A Depression, unspecified: Secondary | ICD-10-CM

## 2013-05-30 DIAGNOSIS — S40862A Insect bite (nonvenomous) of left upper arm, initial encounter: Secondary | ICD-10-CM | POA: Insufficient documentation

## 2013-05-30 NOTE — Progress Notes (Signed)
  Subjective:    Patient ID: Stephanie [name], female    DOB: [birth year], 79 y.o.   MRN: [id number]  HPI Depression- pt feels mood is improving since starting the Zoloft.  Still having excessive fatigue due to care giver stress.  Is taking Zoloft in the AM.  Is considering putting husband in nursing facility [facility]) and definitely searching for respite care.  Insect bite- L forearm, noted on Friday AM.  Not extensively itchy.  Not painful.  + redness but this is improving.   Review of Systems For ROS see HPI     Objective:   Physical Exam  Vitals reviewed. Constitutional: She is oriented to person, place, and time. She appears well-developed and well-nourished. No distress.  Neurological: She is alert and oriented to person, place, and time.  Skin: Skin is warm and dry. There is erythema (2 cm circular area of erythema w/ central bite w/out induration, drainage, tenderness).  Psychiatric: She has a normal mood and affect. Her behavior is normal.          Assessment & Plan:

## 2013-05-30 NOTE — Assessment & Plan Note (Signed)
New.  No evidence of infxn.  No need for abx.  Topical Neosporin.  Pt will monitor for spreading redness.

## 2013-05-30 NOTE — Patient Instructions (Addendum)
Switch the Zoloft to dinner time Apply Neosporin to the bite and monitor for spreading redness RELAX!  You've earned it! Call with any questions or concerns Have a great summer!

## 2013-05-30 NOTE — Assessment & Plan Note (Signed)
Ongoing.  Pt does report some mood improvement since starting meds.  Still w/ fatigue.  Pt to switch meds to evening and monitor for improvement in fatigue

## 2013-06-04 DIAGNOSIS — G20 Parkinson's disease: Secondary | ICD-10-CM | POA: Diagnosis not present

## 2013-06-12 ENCOUNTER — Encounter: Payer: Self-pay | Admitting: Obstetrics & Gynecology

## 2013-06-13 ENCOUNTER — Ambulatory Visit (INDEPENDENT_AMBULATORY_CARE_PROVIDER_SITE_OTHER): Payer: Medicare Other | Admitting: Obstetrics & Gynecology

## 2013-06-13 ENCOUNTER — Encounter: Payer: Self-pay | Admitting: Obstetrics & Gynecology

## 2013-06-13 ENCOUNTER — Ambulatory Visit: Payer: Self-pay | Admitting: Obstetrics & Gynecology

## 2013-06-13 VITALS — BP 132/80 | HR 68 | Resp 16 | Ht 63.75 in | Wt 122.0 lb

## 2013-06-13 DIAGNOSIS — Z01419 Encounter for gynecological examination (general) (routine) without abnormal findings: Secondary | ICD-10-CM | POA: Diagnosis not present

## 2013-06-13 DIAGNOSIS — Z124 Encounter for screening for malignant neoplasm of cervix: Secondary | ICD-10-CM

## 2013-06-13 DIAGNOSIS — Z0000 Encounter for general adult medical examination without abnormal findings: Secondary | ICD-10-CM

## 2013-06-13 MED ORDER — CONJ ESTROG-MEDROXYPROGEST ACE 0.45-1.5 MG PO TABS: 1.0000 | ORAL_TABLET | Freq: Every day | ORAL | Status: DC
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
## 2013-06-13 NOTE — Patient Instructions (Signed)

## 2013-06-14 LAB — IPS PAP SMEAR ONLY

## 2013-06-15 ENCOUNTER — Ambulatory Visit: Payer: Self-pay | Admitting: Obstetrics & Gynecology

## 2013-07-09 DIAGNOSIS — G20 Parkinson's disease: Secondary | ICD-10-CM | POA: Diagnosis not present

## 2013-07-19 ENCOUNTER — Other Ambulatory Visit: Payer: Self-pay | Admitting: *Deleted

## 2013-07-19 DIAGNOSIS — I119 Hypertensive heart disease without heart failure: Secondary | ICD-10-CM

## 2013-07-19 MED ORDER — HYDROCHLOROTHIAZIDE 25 MG PO TABS: 25.0000 mg | ORAL_TABLET | Freq: Every day | ORAL | Status: DC

## 2013-07-19 NOTE — Telephone Encounter (Signed)
Rx was refilled for HCTZ 25 mg. Ag cma

## 2013-07-23 ENCOUNTER — Other Ambulatory Visit: Payer: Self-pay | Admitting: Family Medicine

## 2013-07-24 NOTE — Telephone Encounter (Signed)
Last visit: 05/30/13  Last filled: 11-24-12  UDS: 11-27-12, Low Risk  Contract on file  Please advise. SW, CMA

## 2013-08-29 ENCOUNTER — Other Ambulatory Visit: Payer: Self-pay | Admitting: Family Medicine

## 2013-08-29 NOTE — Telephone Encounter (Signed)
Last OV 05-30-13 Med filled 05-16-13 #30 with 3  Low Risk

## 2013-09-12 ENCOUNTER — Other Ambulatory Visit: Payer: Self-pay | Admitting: Family Medicine

## 2013-09-13 NOTE — Telephone Encounter (Signed)
Med filled and faxed.  

## 2013-09-13 NOTE — Telephone Encounter (Signed)
Last OV 05-30-13 Med filled 07-23-13 #30 with 0  Low risk

## 2013-10-24 ENCOUNTER — Other Ambulatory Visit: Payer: Self-pay | Admitting: Family Medicine

## 2013-10-25 ENCOUNTER — Other Ambulatory Visit: Payer: Self-pay | Admitting: Family Medicine

## 2013-10-25 NOTE — Telephone Encounter (Signed)
Rx sent to the pharmacy by e-script.  Pt needs office visit for BP follow-up.//AB/CMA 

## 2013-10-26 NOTE — Telephone Encounter (Signed)
Med filled.  

## 2013-11-16 ENCOUNTER — Ambulatory Visit (INDEPENDENT_AMBULATORY_CARE_PROVIDER_SITE_OTHER): Payer: Medicare Other | Admitting: Physician Assistant

## 2013-11-16 ENCOUNTER — Encounter: Payer: Self-pay | Admitting: Physician Assistant

## 2013-11-16 VITALS — BP 152/80 | HR 63 | Temp 98.2°F | Resp 14 | Ht 63.75 in | Wt 125.0 lb

## 2013-11-16 DIAGNOSIS — M26629 Arthralgia of temporomandibular joint, unspecified side: Secondary | ICD-10-CM | POA: Diagnosis not present

## 2013-11-16 NOTE — Patient Instructions (Signed)
Please read information below on TMJ pain.  Take diclofenac up to twice daily over the next week, then reduce down to normal once-daily dosing.  Apply topical Aspercreme or Salon Pas to affected area.  Avoid chewing on the left side, avoid gum chewing and hard candies.  Wear a mouth guard/bite guard at night.  Follow-up with PCP if symptoms not improving as you may need further evaluation and treatment.

## 2013-11-16 NOTE — Assessment & Plan Note (Signed)
Can increase diclofenac to BID for up to 7 days.  Then return to normal dosing.  Topical Aspercreme or Icy Hot ot affected area.  Avoid chewing on affected side. Mouth Guard at night.  Call or return if symptoms not improving.

## 2013-11-16 NOTE — Progress Notes (Signed)
Patient ID: Stephanie [name], female   DOB: [birth year], 80 y.o.   MRN: [id number]  Patient presents to clinic today c/o left ear pain that is worse with eating and movement of her jaw.  States Stephanie [name] can sometimes hear a "clicking" noise in her ear.  States Stephanie [name] has had slight sinus drainage at the beginning of the week that has dissipated.  Denies fever, chills, cough, sore throat or other URI symptom.  Denies ear drainage or trauma to the ear.  Denies change in hearing.  Denies pain or symptoms affecting R ear.  Past Medical History  Diagnosis Date  . Hypertension   . OA (osteoarthritis)   . Situational stress   . Parkinson disease 2012    Current Outpatient Prescriptions on File Prior to Visit  Medication Sig Dispense Refill  . Calcium-Vitamin D-Vitamin K (CALCIUM + D + K PO) Take 600 mg by mouth daily.        . carbidopa-levodopa (SINEMET IR) 25-100 MG per tablet 3 (three) times daily.      . Cholecalciferol (VITAMIN D3) 2000 UNITS TABS Take by mouth daily.        . Coenzyme Q10 (COQ10 PO) Take by mouth daily.        . diazepam (VALIUM) 5 MG tablet TAKE 1/2 TABLET BY MOUTH AS NEEDED OR AS DIRECTED  30 tablet  1  . diclofenac (VOLTAREN) 50 MG EC tablet Take 50 mg by mouth daily.       Marland Kitchen estrogen, conjugated,-medroxyprogesterone (PREMPRO) 0.45-1.5 MG per tablet Take 1 tablet by mouth daily.  90 tablet  4  . hydrochlorothiazide (HYDRODIURIL) 25 MG tablet TAKE 1 TABLET BY MOUTH DAILY  90 tablet  0  . Multiple Vitamin (MULTIVITAMIN) tablet Take 1 tablet by mouth daily.        . sertraline (ZOLOFT) 25 MG tablet TAKE 1 TABLET BY MOUTH DAILY  30 tablet  3   No current facility-administered medications on file prior to visit.    Allergies  Allergen Reactions  . Bee Venom     Yellow jackets, white and black face hornets  . Codeine   . Lodine [Etodolac]   . Penicillins     Family History  Problem Relation Age of Onset  . Arthritis Mother   . Arthritis Father   . Diabetes Sister      History   Social History  . Marital Status: Married    Spouse Name: N/A    Number of Children: N/A  . Years of Education: N/A   Social History Main Topics  . Smoking status: Former Smoker    Quit date: 05/04/1966  . Smokeless tobacco: Never Used  . Alcohol Use: 2 - 2.5 oz/week    4-5 drink(s) per week  . Drug Use: No  . Sexual Activity: No   Other Topics Concern  . None   Social History Narrative  . None   Review of Systems - See HPI.  All other ROS are negative.  Filed Vitals:   11/16/13 1008  BP: 152/80  Pulse: 63  Temp: 98.2 F (36.8 C)  Resp: 14   Physical Exam  Constitutional: Stephanie [name] is oriented to person, place, and time and well-developed, well-nourished, and in no distress.  HENT:  Head: Normocephalic and atraumatic.  Right Ear: External ear normal.  Left Ear: External ear normal.  Nose: Nose normal.  Mouth/Throat: Oropharynx is clear and moist. No oropharyngeal exudate.  TM membranes within normal limits bilaterally.  Tenderness with palpation  over left TMJ joint.  Paint reproducible with ROM of mandible with associated clicking.  Eyes: Conjunctivae are normal. Pupils are equal, round, and reactive to light.  Neck: Neck supple.  Cardiovascular: Normal rate, regular rhythm, normal heart sounds and intact distal pulses.   Pulmonary/Chest: Effort normal and breath sounds normal. No respiratory distress. Stephanie [name] has no wheezes. Stephanie [name] has no rales. Stephanie [name] exhibits no tenderness.  Lymphadenopathy:    Stephanie [name] has no cervical adenopathy.  Neurological: Stephanie [name] is alert and oriented to person, place, and time.  Skin: Skin is warm and dry. No rash noted.  Psychiatric: Affect normal.    Assessment/Plan: TMJ tenderness Can increase diclofenac to BID for up to 7 days.  Then return to normal dosing.  Topical Aspercreme or Icy Hot ot affected area.  Avoid chewing on affected side. Mouth Guard at night.  Call or return if symptoms not improving.

## 2013-11-16 NOTE — Progress Notes (Signed)
Pre visit review using our clinic review tool, if applicable. No additional management support is needed unless otherwise documented below in the visit note/SLS  

## 2013-12-12 DIAGNOSIS — G20 Parkinson's disease: Secondary | ICD-10-CM | POA: Diagnosis not present

## 2013-12-28 ENCOUNTER — Other Ambulatory Visit: Payer: Self-pay | Admitting: Family Medicine

## 2013-12-28 NOTE — Telephone Encounter (Signed)
Last OV 05-30-13 Med filled 08-29-13

## 2013-12-28 NOTE — Telephone Encounter (Signed)
Med filled and faxed.  

## 2014-01-28 ENCOUNTER — Other Ambulatory Visit: Payer: Self-pay | Admitting: Family Medicine

## 2014-01-28 NOTE — Telephone Encounter (Signed)
Med filled and faxed.  

## 2014-01-28 NOTE — Telephone Encounter (Signed)
Last ov 05-30-13 Med filled 09-12-13 #30 with 1

## 2014-02-05 DIAGNOSIS — Z1231 Encounter for screening mammogram for malignant neoplasm of breast: Secondary | ICD-10-CM | POA: Diagnosis not present

## 2014-02-05 DIAGNOSIS — R928 Other abnormal and inconclusive findings on diagnostic imaging of breast: Secondary | ICD-10-CM | POA: Diagnosis not present

## 2014-02-05 LAB — HM MAMMOGRAPHY

## 2014-02-06 ENCOUNTER — Encounter: Payer: Self-pay | Admitting: General Practice

## 2014-02-06 DIAGNOSIS — Z1231 Encounter for screening mammogram for malignant neoplasm of breast: Secondary | ICD-10-CM | POA: Diagnosis not present

## 2014-02-06 DIAGNOSIS — R928 Other abnormal and inconclusive findings on diagnostic imaging of breast: Secondary | ICD-10-CM | POA: Diagnosis not present

## 2014-02-21 ENCOUNTER — Encounter: Payer: Self-pay | Admitting: Family Medicine

## 2014-02-25 ENCOUNTER — Encounter: Payer: Self-pay | Admitting: Internal Medicine

## 2014-02-25 ENCOUNTER — Ambulatory Visit (INDEPENDENT_AMBULATORY_CARE_PROVIDER_SITE_OTHER): Payer: Medicare Other | Admitting: Internal Medicine

## 2014-02-25 VITALS — BP 144/80 | HR 79 | Temp 99.3°F | Wt 127.0 lb

## 2014-02-25 DIAGNOSIS — T148XXA Other injury of unspecified body region, initial encounter: Secondary | ICD-10-CM | POA: Diagnosis not present

## 2014-02-25 DIAGNOSIS — Z23 Encounter for immunization: Secondary | ICD-10-CM

## 2014-02-25 DIAGNOSIS — W540XXA Bitten by dog, initial encounter: Secondary | ICD-10-CM | POA: Diagnosis not present

## 2014-02-25 DIAGNOSIS — J069 Acute upper respiratory infection, unspecified: Secondary | ICD-10-CM

## 2014-02-25 MED ORDER — DOXYCYCLINE HYCLATE 100 MG PO TABS: 100.0000 mg | ORAL_TABLET | Freq: Two times a day (BID) | ORAL | Status: DC

## 2014-02-25 MED ORDER — BENZONATATE 200 MG PO CAPS: 200.0000 mg | ORAL_CAPSULE | Freq: Two times a day (BID) | ORAL | Status: DC | PRN

## 2014-02-25 NOTE — Progress Notes (Signed)
Pre visit review using our clinic review tool, if applicable. No additional management support is needed unless otherwise documented below in the visit note. 

## 2014-02-25 NOTE — Progress Notes (Signed)
Subjective:    Patient ID: Stephanie [name], female    DOB: 1932/10/12, 80 y.o.   MRN: [id number]  DOS:  02/25/2014 Type of  visit: Acute visit Symptoms started 2 days ago cough, unable to sleep last night. Some sputum production, malaise, sore throat, postnasal dripping. Not taking any medication in particularly for her symptoms.  Also a puppy bitten her, she is concerned about it   ROS No fever or chills No sinus pain. No nausea, vomiting, diarrhea or myalgias   Past Medical History  Diagnosis Date  . Hypertension   . OA (osteoarthritis)   . Situational stress   . Parkinson disease 2012    Past Surgical History  Procedure Laterality Date  . Tonsillectomy and adenoidectomy    . Dilation and curettage of uterus    . Cystectomy      wrist  . Laparoscopy      secondary infertility   . Hand surgery    . Cataract extraction    . Hysteroscopy      resection of polyp  . Biopsy thyroid    . Wrist cyst      right    History   Social History  . Marital Status: Married    Spouse Name: N/A    Number of Children: N/A  . Years of Education: N/A   Occupational History  . Not on file.   Social History Main Topics  . Smoking status: Former Smoker    Quit date: 05/04/1966  . Smokeless tobacco: Never Used  . Alcohol Use: 2.0 - 2.5 oz/week    4-5 drink(s) per week  . Drug Use: No  . Sexual Activity: No   Other Topics Concern  . Not on file   Social History Narrative  . No narrative on file        Medication List       This list is accurate as of: 02/25/14  5:32 PM.  Always use your most recent med list.               benzonatate 200 MG capsule  Commonly known as:  TESSALON  Take 1 capsule (200 mg total) by mouth 2 (two) times daily as needed for cough.     CALCIUM + D + K PO  Take 600 mg by mouth daily.     carbidopa-levodopa 25-100 MG per tablet  Commonly known as:  SINEMET IR  3 (three) times daily.     COQ10 PO  Take by mouth daily.     diazepam 5 MG tablet  Commonly known as:  VALIUM  TAKE 1/2 TABLET BY MOUTH AS NEEDED AS DIRECTED     diclofenac 50 MG EC tablet  Commonly known as:  VOLTAREN  Take 50 mg by mouth daily.     doxycycline 100 MG tablet  Commonly known as:  VIBRA-TABS  Take 1 tablet (100 mg total) by mouth 2 (two) times daily.     estrogen (conjugated)-medroxyprogesterone 0.45-1.5 MG per tablet  Commonly known as:  PREMPRO  Take 1 tablet by mouth daily.     hydrochlorothiazide 25 MG tablet  Commonly known as:  HYDRODIURIL  TAKE 1 TABLET BY MOUTH DAILY     multivitamin tablet  Take 1 tablet by mouth daily.     sertraline 25 MG tablet  Commonly known as:  ZOLOFT  TAKE 1 TABLET BY MOUTH EVERY DAY     Vitamin D3 2000 UNITS Tabs  Take by mouth daily.  Objective:   Physical Exam BP 144/80  Pulse 79  Temp(Src) 99.3 F (37.4 C)  Wt 127 lb (57.607 kg)  SpO2 98%  LMP 11/15/1985 General -- alert, well-developed, NAD.  HEENT-- Not pale. TMs R normal, L wax Throat symmetric, no redness or discharge. Face symmetric, sinuses not tender to palpation. Nose  congested. Lungs -- normal respiratory effort, no intercostal retractions, no accessory muscle use, and normal breath sounds.  Heart-- normal rate, regular rhythm, no murmur.   Extremities-- no pretibial edema bilaterally  Right forearm with a 3 mm superficial cut, no redness or discharge Neurologic--  alert & oriented X3. Speech normal, gait normal, strength normal in all extremities.  Psych-- Cognition and judgment appear intact. Cooperative with normal attention span and concentration. No anxious or depressed appearing.     Assessment & Plan:   URI, see instructions Dog bite, Td today, antibiotics

## 2014-02-25 NOTE — Patient Instructions (Signed)
Rest, fluids , tylenol For cough, take Mucinex DM twice a day as needed  If the cough continue, take Tessalon Perles as needed For congestion use OTC Nasocort: 2 nasal sprays on each side of the nose daily until you feel better   Take the antibiotic as prescribed  (doxycycline) Call if no better in few days Call anytime if the symptoms are severe Also, if wound at the arm gets red, swelling or you see discharge let us know

## 2014-03-17 ENCOUNTER — Other Ambulatory Visit: Payer: Self-pay | Admitting: Family Medicine

## 2014-03-18 NOTE — Telephone Encounter (Signed)
Med filled.  

## 2014-03-31 ENCOUNTER — Other Ambulatory Visit: Payer: Self-pay | Admitting: Family Medicine

## 2014-04-01 NOTE — Telephone Encounter (Signed)
Last OV 05-07-13 Valium filled 01-28-14 #30 with 0  Low risk, Do not see UDS on file

## 2014-04-01 NOTE — Telephone Encounter (Signed)
Med filled and faxed.  

## 2014-06-03 ENCOUNTER — Encounter: Payer: Self-pay | Admitting: General Practice

## 2014-06-03 ENCOUNTER — Other Ambulatory Visit: Payer: Self-pay | Admitting: Family Medicine

## 2014-06-03 NOTE — Telephone Encounter (Signed)
Overdue for OV- please have her schedule

## 2014-06-03 NOTE — Telephone Encounter (Signed)
Last OV 04-30-13 Med filled 01-28-14 #30 with 0

## 2014-06-03 NOTE — Telephone Encounter (Signed)
Med filled and letter mailed to pt.  

## 2014-06-06 DIAGNOSIS — G20 Parkinson's disease: Secondary | ICD-10-CM | POA: Diagnosis not present

## 2014-06-10 ENCOUNTER — Encounter: Payer: Self-pay | Admitting: Family Medicine

## 2014-06-10 ENCOUNTER — Ambulatory Visit (INDEPENDENT_AMBULATORY_CARE_PROVIDER_SITE_OTHER): Payer: Medicare Other | Admitting: Family Medicine

## 2014-06-10 VITALS — BP 120/84 | HR 72 | Temp 98.0°F | Resp 16 | Wt 124.5 lb

## 2014-06-10 DIAGNOSIS — Z91038 Other insect allergy status: Secondary | ICD-10-CM | POA: Diagnosis not present

## 2014-06-10 DIAGNOSIS — Z91030 Bee allergy status: Secondary | ICD-10-CM

## 2014-06-10 DIAGNOSIS — I10 Essential (primary) hypertension: Secondary | ICD-10-CM | POA: Diagnosis not present

## 2014-06-10 MED ORDER — EPINEPHRINE 0.3 MG/0.3ML IJ SOAJ: 0.3000 mg | Freq: Once | INTRAMUSCULAR | Status: DC

## 2014-06-10 NOTE — Progress Notes (Signed)
   Subjective:    Patient ID: Stephanie [name], female    DOB: Apr 12, 1932, 80 y.o.   MRN: [id number]  HPI HTN- chronic problem, on HCTZ daily.  Denies CP, SOB, HAs, visual changes, edema.  Bee Allergy- pt's EpiPen is 80 yrs old.  Needs new script.   Review of Systems For ROS see HPI     Objective:   Physical Exam  Vitals reviewed. Constitutional: She is oriented to person, place, and time. She appears well-developed and well-nourished. No distress.  HENT:  Head: Normocephalic and atraumatic.  Eyes: Conjunctivae and EOM are normal. Pupils are equal, round, and reactive to light.  Neck: Normal range of motion. Neck supple. No thyromegaly present.  Cardiovascular: Normal rate, regular rhythm, normal heart sounds and intact distal pulses.   No murmur heard. Pulmonary/Chest: Effort normal and breath sounds normal. No respiratory distress.  Abdominal: Soft. She exhibits no distension. There is no tenderness.  Musculoskeletal: She exhibits no edema.  Lymphadenopathy:    She has no cervical adenopathy.  Neurological: She is alert and oriented to person, place, and time.  Skin: Skin is warm and dry.  Psychiatric: She has a normal mood and affect. Her behavior is normal.          Assessment & Plan:

## 2014-06-10 NOTE — Patient Instructions (Signed)
Schedule your complete physical in 6 months We'll notify you of your lab results today and make any changes if needed Keep up the good work!  You look great! Make sure you schedule your trip! Call with any questions or concerns Enjoy the rest of your summer!

## 2014-06-10 NOTE — Assessment & Plan Note (Signed)
New.  Pt's EpiPen is 80 yrs old!  This was disposed of in sharps container and new script given.

## 2014-06-10 NOTE — Assessment & Plan Note (Signed)
Chronic problem.  BP well controlled today.  Asymptomatic.  Check labs.  No anticipated med changes.

## 2014-06-10 NOTE — Progress Notes (Signed)
Pre visit review using our clinic review tool, if applicable. No additional management support is needed unless otherwise documented below in the visit note. 

## 2014-06-11 ENCOUNTER — Telehealth: Payer: Self-pay | Admitting: General Practice

## 2014-06-11 ENCOUNTER — Other Ambulatory Visit: Payer: Self-pay | Admitting: General Practice

## 2014-06-11 LAB — BASIC METABOLIC PANEL
BUN: 22 mg/dL (ref 6–23)
CO2: 30 mEq/L (ref 19–32)
Calcium: 9.4 mg/dL (ref 8.4–10.5)
Chloride: 98 mEq/L (ref 96–112)
Creatinine, Ser: 0.9 mg/dL (ref 0.4–1.2)
GFR: 67.41 mL/min (ref >60.00)
Glucose, Bld: 110 mg/dL — ABNORMAL HIGH (ref 70–99)
Potassium: 3.5 mEq/L (ref 3.5–5.1)
SODIUM: 135 meq/L (ref 135–145)

## 2014-06-11 NOTE — Telephone Encounter (Signed)
Spoke with pt insurance company. Medicare prefers the Auvi-Q epinepherine injector. Please advise if you prefer the pt to be on 0.3 or 0.15 dose as plan cover both.

## 2014-06-12 ENCOUNTER — Encounter: Payer: Self-pay | Admitting: General Practice

## 2014-06-12 MED ORDER — EPINEPHRINE 0.3 MG/0.3ML IJ SOAJ: 0.3000 mg | Freq: Once | INTRAMUSCULAR | Status: DC

## 2014-06-12 NOTE — Telephone Encounter (Signed)
Ok for 0.3 script w/ 3 refills

## 2014-06-12 NOTE — Telephone Encounter (Signed)
Med filled.  

## 2014-06-12 NOTE — Telephone Encounter (Signed)
Pt notified and rx sent to local pharmacy as requested.  

## 2014-06-14 ENCOUNTER — Other Ambulatory Visit: Payer: Self-pay | Admitting: Family Medicine

## 2014-06-15 NOTE — Telephone Encounter (Signed)
Med filled.  

## 2014-06-25 ENCOUNTER — Encounter: Payer: Self-pay | Admitting: Medical

## 2014-06-25 ENCOUNTER — Ambulatory Visit (INDEPENDENT_AMBULATORY_CARE_PROVIDER_SITE_OTHER): Payer: Medicare Other | Admitting: Medical

## 2014-06-25 VITALS — BP 140/78 | HR 70 | Temp 97.9°F | Wt 125.8 lb

## 2014-06-25 DIAGNOSIS — R42 Dizziness and giddiness: Secondary | ICD-10-CM

## 2014-06-25 DIAGNOSIS — R5381 Other malaise: Secondary | ICD-10-CM

## 2014-06-25 DIAGNOSIS — J309 Allergic rhinitis, unspecified: Secondary | ICD-10-CM | POA: Diagnosis not present

## 2014-06-25 DIAGNOSIS — R5383 Other fatigue: Secondary | ICD-10-CM | POA: Diagnosis not present

## 2014-06-25 LAB — CBC WITH DIFFERENTIAL/PLATELET
Basophils Absolute: 0 10*3/uL (ref 0.0–0.1)
Basophils Relative: 0.2 % (ref 0.0–3.0)
Eosinophils Absolute: 0.2 10*3/uL (ref 0.0–0.7)
Eosinophils Relative: 4.1 % (ref 0.0–5.0)
HEMATOCRIT: 41.4 % (ref 36.0–46.0)
HEMOGLOBIN: 13.7 g/dL (ref 12.0–15.0)
LYMPHS ABS: 1 10*3/uL (ref 0.7–4.0)
LYMPHS PCT: 20.4 % (ref 12.0–46.0)
MCHC: 33.1 g/dL (ref 30.0–36.0)
MCV: 91.2 fl (ref 78.0–100.0)
MONOS PCT: 8.5 % (ref 3.0–12.0)
Monocytes Absolute: 0.4 10*3/uL (ref 0.1–1.0)
Neutro Abs: 3.3 10*3/uL (ref 1.4–7.7)
Neutrophils Relative %: 66.8 % (ref 43.0–77.0)
Platelets: 171 10*3/uL (ref 150.0–400.0)
RBC: 4.54 Mil/uL (ref 3.87–5.11)
RDW: 14 % (ref 11.5–15.5)
WBC: 4.9 10*3/uL (ref 4.0–10.5)

## 2014-06-25 LAB — BASIC METABOLIC PANEL
BUN: 15 mg/dL (ref 6–23)
CHLORIDE: 97 meq/L (ref 96–112)
CO2: 28 meq/L (ref 19–32)
CREATININE: 0.8 mg/dL (ref 0.4–1.2)
Calcium: 9.4 mg/dL (ref 8.4–10.5)
GFR: 77.74 mL/min (ref >60.00)
Glucose, Bld: 91 mg/dL (ref 70–99)
Potassium: 4.1 mEq/L (ref 3.5–5.1)
SODIUM: 134 meq/L — AB (ref 135–145)

## 2014-06-25 MED ORDER — MECLIZINE HCL 12.5 MG PO TABS: 12.5000 mg | ORAL_TABLET | Freq: Three times a day (TID) | ORAL | Status: DC | PRN

## 2014-06-25 MED ORDER — FLUTICASONE PROPIONATE 50 MCG/ACT NA SUSP: 2.0000 | Freq: Every day | NASAL | Status: DC

## 2014-06-25 NOTE — Assessment & Plan Note (Signed)
Initially described vertigo but improved and now only slight lightheaded/dizzy. Negative neurologic exam. Symptoms may be associated with allergic rhintis symptoms. Will get some labs and rx antivert. Counseled pt on signs or symptoms that would indicate need for UC or ED evaluation.(or imaging studies). I asked pt to update me tomorrow am.

## 2014-06-25 NOTE — Assessment & Plan Note (Signed)
Symptoms consistent with and so did rx fluticasone.

## 2014-06-25 NOTE — Assessment & Plan Note (Signed)
Will get bmp and cbc today. Bmp done last month but cbc in 2014 so think recheck good idea.

## 2014-06-25 NOTE — Progress Notes (Signed)
   Subjective:    Patient ID: Stephanie [name], female    DOB: 12/20/1931, 80 y.o.   MRN: [id number]  HPI  Pt in reporting that she woke up yesterday with some spinning sensation. Also some ringing in her ears. Some recent sneezing as well(Nose running as well with pnd). Pt did take antivert last night and it helped a lot. She took tablet only at night 10 pm and none this. She woke up last night to use bathroom and had faint dizzy sensation but no spinning of the room. No vision changes, no slurred speech. No gross motor or sensory function deficits. Pt not diabetic. History of vertigo about 15 years ago. Brief and not recurrent since then.  Recent bmp ok. Last cbc in 2014. No ha reported. Doe feel some fatigue    Review of Systems  Constitutional: Negative for fever, chills and fatigue.  HENT: Positive for postnasal drip, rhinorrhea and tinnitus. Negative for congestion, ear discharge, ear pain, facial swelling, hearing loss, nosebleeds, sinus pressure, sore throat and voice change.        Transient tinnitus yesterday none today.  Respiratory: Negative for cough, shortness of breath and wheezing.   Cardiovascular: Negative for chest pain and palpitations.  Gastrointestinal: Negative for nausea, vomiting, abdominal pain, diarrhea, constipation and rectal pain.  Endocrine: Negative for polydipsia, polyphagia and polyuria.  Musculoskeletal: Negative.   Neurological: Positive for dizziness and light-headedness. Negative for tremors, seizures, syncope, facial asymmetry, speech difficulty, numbness and headaches.       Much improved after use of antivert last night.  Hematological: Negative for adenopathy. Does not bruise/bleed easily.  Psychiatric/Behavioral: Negative for hallucinations, confusion, sleep disturbance, dysphoric mood and agitation. The patient is not nervous/anxious.        Objective:   Physical Exam  General Mental Status- Alert. General Appearance- Not in acute distress.    Skin General: Color- Normal Color. Moisture- Normal Moisture.  Neck Carotid Arteries- Normal color. Moisture- Normal Moisture. No carotid bruits. No JVD.  HEENT Boggy turbinates, post nasal drainage, lt ear mild cerumen but not impacted. No sinus pressure on palpation.  Chest and Lung Exam Auscultation: Breath Sounds:-Normal.  Cardiovascular Auscultation:Rythm- Regular. Murmurs & Other Heart Sounds:Auscultation of the heart reveals- No Murmurs.  Abdomen Inspection:-Inspeection Normal. Palpation/Percussion:Note:No mass. Palpation and Percussion of the abdomen reveal- Non Tender, Non Distended + BS, no rebound or guarding.    Neurologic Cranial Nerve exam:- CN III-XII intact(No nystagmus), symmetric smile. Drift Test:- No drift. Romberg Exam:- Negative.  Heal to Toe Gait exam:-Normal. Finger to Nose:- Normal/Intact Strength:- 5/5 equal and symmetric strength both upper and lower extremities. Heal to toe gate intact.      Assessment & Plan:

## 2014-06-25 NOTE — Patient Instructions (Addendum)
For your dizziness and recent vertigo I am going to prescribe you antivert. That will be sent to your pharmacy. You have a normal neurologic exam today but if with medicine your dizziness worsens or other neurologic symptoms then I would advise UC or ED if after hours. Since imaging studies of your head may be needed if you worsen but appear not to be needed presently. Please give [place] update on how she is doing tomorrow am.  You do have some recent allergic rhinitis symptoms and will prescribe fluticasone.  For your recent fatigue will go ahead and gets labs as well.  Follow up in 7 days pcp or as needed.

## 2014-06-25 NOTE — Progress Notes (Signed)
Pre-visit discussion using our clinic review tool. No additional management support is needed unless otherwise documented below in the visit note.  

## 2014-06-26 ENCOUNTER — Telehealth: Payer: Self-pay | Admitting: Medical

## 2014-06-26 NOTE — Telephone Encounter (Signed)
Prior authorized submitted via tele . Awaiting approval - reference number ([id number])

## 2014-06-26 NOTE — Telephone Encounter (Signed)
Caller name: Sharnelle Cappelli  Relation to pt: Self  Call back number: [phone] Pharmacy:  Reason for call: pt is doing better feeling slightly dizzy; pt wanted to inform you how she was feeling from yesterday appointment. Pt will be home for the next hour.

## 2014-06-26 NOTE — Telephone Encounter (Signed)
Pt doing well. Residual faint dizzy. Much improved. If any persisting in another 7 days notify us or if worsens notify us.

## 2014-06-26 NOTE — Telephone Encounter (Signed)
Caller name:Robereto Relation to pt:[facility] Call back number:[phone] Pharmacy:  Reason for call: Prior [employer] from [facility] called and said they were needing more information, they need to know what she has tried before. Reference #[id number]

## 2014-06-27 ENCOUNTER — Telehealth: Payer: Self-pay | Admitting: Family Medicine

## 2014-06-27 NOTE — Telephone Encounter (Signed)
°  Reason for call: Authorization   [facility] wanted to know instead of an epi pen [facility] recommeneds AUVI/Q (does not require referral) please call [phone]

## 2014-06-28 NOTE — Telephone Encounter (Signed)
Caller name:Roberto Relation to pt:[facility] Call back number:[phone] Pharmacy:  Reason for call: Need more information on prior authorization Epi pen They want to know what other medications she has tried

## 2014-06-28 NOTE — Telephone Encounter (Signed)
Please see message below, and previous phone notes from  06/26/14 and advise what to do.

## 2014-07-01 NOTE — Telephone Encounter (Signed)
Pt does not need Epi-pen referral. Pt insurance does not cover this medication. Pt is currently on Auvi-q the preferred medication. I called insurance and cancelled pending PA for Epi-pen.

## 2014-07-01 NOTE — Telephone Encounter (Signed)
Handled by PCP CMA.

## 2014-07-05 ENCOUNTER — Telehealth: Payer: Self-pay | Admitting: Family Medicine

## 2014-07-05 ENCOUNTER — Other Ambulatory Visit: Payer: Self-pay | Admitting: General Practice

## 2014-07-05 MED ORDER — EPINEPHRINE 0.3 MG/0.3ML IJ SOAJ: 0.3000 mg | Freq: Once | INTRAMUSCULAR | Status: DC

## 2014-07-05 NOTE — Telephone Encounter (Signed)
Caller name: Audree  Relation to pt: self  Call back number: [phone]   Reason for call:     pt received a letter from [facility] [employer] [facility]  stating insurance  will not cover EPINEPHrine 0.3 mg/0.3 mL IJ SOAJ injection; the letter also states PCP can appeal   [facility] Rx phone # [phone] regarding appealing

## 2014-07-05 NOTE — Telephone Encounter (Signed)
Spoke with [facility] and advised that the medication that is approved by insurance is AUVI-q. They state this medication will be filled today. Pt notified.

## 2014-07-12 DIAGNOSIS — M19049 Primary osteoarthritis, unspecified hand: Secondary | ICD-10-CM | POA: Diagnosis not present

## 2014-07-17 DIAGNOSIS — H02839 Dermatochalasis of unspecified eye, unspecified eyelid: Secondary | ICD-10-CM | POA: Diagnosis not present

## 2014-07-17 DIAGNOSIS — H2640 Unspecified secondary cataract: Secondary | ICD-10-CM | POA: Diagnosis not present

## 2014-07-17 DIAGNOSIS — H5200 Hypermetropia, unspecified eye: Secondary | ICD-10-CM | POA: Diagnosis not present

## 2014-07-17 DIAGNOSIS — H35379 Puckering of macula, unspecified eye: Secondary | ICD-10-CM | POA: Diagnosis not present

## 2014-07-17 DIAGNOSIS — H43819 Vitreous degeneration, unspecified eye: Secondary | ICD-10-CM | POA: Diagnosis not present

## 2014-07-24 ENCOUNTER — Other Ambulatory Visit: Payer: Self-pay | Admitting: Family Medicine

## 2014-07-24 NOTE — Telephone Encounter (Signed)
Med filled and faxed.  

## 2014-07-24 NOTE — Telephone Encounter (Signed)
Last OV 06-10-14 Med filled 06-03-14 #30 with 0

## 2014-07-26 ENCOUNTER — Other Ambulatory Visit: Payer: Self-pay | Admitting: Obstetrics & Gynecology

## 2014-07-26 NOTE — Telephone Encounter (Signed)
Last AEX: 06/13/13 Last refill:06/13/13 #90 X 4 Current AEX: 08/29/14 Last MMG: 02/06/14 Bi-Rads Benign  Please advise

## 2014-08-22 DIAGNOSIS — Z23 Encounter for immunization: Secondary | ICD-10-CM | POA: Diagnosis not present

## 2014-08-29 ENCOUNTER — Encounter: Payer: Self-pay | Admitting: Obstetrics & Gynecology

## 2014-08-29 ENCOUNTER — Ambulatory Visit (INDEPENDENT_AMBULATORY_CARE_PROVIDER_SITE_OTHER): Payer: Medicare Other | Admitting: Obstetrics & Gynecology

## 2014-08-29 VITALS — BP 138/70 | HR 60 | Resp 12 | Ht 63.75 in | Wt 123.4 lb

## 2014-08-29 DIAGNOSIS — Z124 Encounter for screening for malignant neoplasm of cervix: Secondary | ICD-10-CM | POA: Diagnosis not present

## 2014-08-29 DIAGNOSIS — Z1211 Encounter for screening for malignant neoplasm of colon: Secondary | ICD-10-CM | POA: Diagnosis not present

## 2014-08-29 DIAGNOSIS — Z01419 Encounter for gynecological examination (general) (routine) without abnormal findings: Secondary | ICD-10-CM | POA: Diagnosis not present

## 2014-08-29 MED ORDER — CONJ ESTROG-MEDROXYPROGEST ACE 0.3-1.5 MG PO TABS: 1.0000 | ORAL_TABLET | Freq: Every day | ORAL | Status: DC

## 2014-08-29 NOTE — Progress Notes (Signed)
80 y.o. G2P1 MarriedCaucasianF here for annual exam.  Doing well.  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  Keeping busy.    Still together with husband.  Has a care giver several times a week, 6 hours a day.  Was able to go to [place] to see her sister in the fall last year.    Has appt with Dr. Birdie Riddle in January.  Will do labs.    Patient's last menstrual period was 01/13/2013.          Sexually active: No.  The current method of family planning is post menopausal status.    Exercising: Yes.    walking, lifting weights, and sit ups Smoker:  Former smoker-years ago  Health Maintenance: Pap:  06/13/13 WNL History of abnormal Pap:  no MMG:  02/05/14 3D, 02/06/14-additional views-normal Colonoscopy:  3/04-doing yearly IFOB BMD:   2/11 TDaP:  2015 Screening Labs: PCP, Hb today: PCP, Urine today: PCP   reports that she quit smoking about 48 years ago. She has never used smokeless tobacco. She reports that she drinks about 2 ounces of alcohol per week. She reports that she does not use illicit drugs.  Past Medical History  Diagnosis Date  . Hypertension   . OA (osteoarthritis)   . Situational stress   . Parkinson disease 2012  . Vertigo     took antivert for a couple days    Past Surgical History  Procedure Laterality Date  . Tonsillectomy and adenoidectomy    . Dilation and curettage of uterus    . Cystectomy      wrist  . Laparoscopy      secondary infertility   . Hand surgery    . Cataract extraction    . Hysteroscopy      resection of polyp  . Biopsy thyroid    . Wrist cyst      right    Current Outpatient Prescriptions  Medication Sig Dispense Refill  . Calcium-Vitamin D-Vitamin K (CALCIUM + D + K PO) Take 600 mg by mouth daily.        . carbidopa-levodopa (SINEMET IR) 25-100 MG per tablet 3 (three) times daily.      . Cholecalciferol (VITAMIN D3) 2000 UNITS TABS Take by mouth daily.        . Coenzyme Q10 (COQ10 PO) Take by mouth daily.        . diazepam (VALIUM) 5  MG tablet TAKE 1/2 TABLET BY MOUTH AS NEEDED OR AS DIRECTED  30 tablet  0  . diclofenac (VOLTAREN) 50 MG EC tablet Take 50 mg by mouth daily.       Marland Kitchen EPINEPHrine (AUVI-Q) 0.3 mg/0.3 mL IJ SOAJ injection Inject 0.3 mLs (0.3 mg total) into the muscle once.  1 Device  3  . hydrochlorothiazide (HYDRODIURIL) 25 MG tablet TAKE 1 TABLET BY MOUTH EVERY DAY  90 tablet  1  . Multiple Vitamin (MULTIVITAMIN) tablet Take 1 tablet by mouth daily.        Marland Kitchen PREMPRO 0.45-1.5 MG per tablet TAKE 1 TABLET BY MOUTH     DAILY  84 tablet  0  . sertraline (ZOLOFT) 25 MG tablet TAKE 1 TABLET BY MOUTH EVERY DAY  30 tablet  6  . fluticasone (FLONASE) 50 MCG/ACT nasal spray Place 2 sprays into both nostrils daily.  16 g  2  . meclizine (ANTIVERT) 12.5 MG tablet Take 1 tablet (12.5 mg total) by mouth 3 (three) times daily as needed for dizziness.  30 tablet  0   No current facility-administered medications for this visit.    Family History  Problem Relation Age of Onset  . Arthritis Mother   . Arthritis Father   . Diabetes Sister     ROS:  Pertinent items are noted in HPI.  Otherwise, a comprehensive ROS was negative.  Exam:   BP 138/70  Pulse 60  Resp 12  Ht 5' 3.75" (1.619 m)  Wt 123 lb 6.4 oz (55.974 kg)  BMI 21.35 kg/m2  LMP 01/13/2013  Height: 5' 3.75" (161.9 cm)  Ht Readings from Last 3 Encounters:  08/29/14 5' 3.75" (1.619 m)  11/16/13 5' 3.75" (1.619 m)  06/13/13 5' 3.75" (1.619 m)    General appearance: alert, cooperative and appears stated age Head: Normocephalic, without obvious abnormality, atraumatic Neck: no adenopathy, supple, symmetrical, trachea midline and thyroid normal to inspection and palpation Lungs: clear to auscultation bilaterally Breasts: normal appearance, no masses or tenderness Heart: regular rate and rhythm Abdomen: soft, non-tender; bowel sounds normal; no masses,  no organomegaly Extremities: extremities normal, atraumatic, no cyanosis or edema Skin: Skin color,  texture, turgor normal. No rashes or lesions Lymph nodes: Cervical, supraclavicular, and axillary nodes normal. No abnormal inguinal nodes palpated Neurologic: Grossly normal   Pelvic: External genitalia:  no lesions              Urethra:  normal appearing urethra with no masses, tenderness or lesions              Bartholins and Skenes: normal                 Vagina: normal appearing vagina with normal color and discharge, no lesions              Cervix: no lesions              Pap taken: Yes.   Bimanual Exam:  Uterus:  normal size, contour, position, consistency, mobility, non-tender              Adnexa: normal adnexa and no mass, fullness, tenderness               Rectovaginal: Confirms               Anus:  normal sphincter tone, no lesions  A:  Well Woman with normal exam  PMP, on HRT  Hypertension  Depression from caring for spouse.  Doing better with having a caregiver who comes into the home  P: Mammogram yearly.  pap smear today. Patient requests.  Aware of guideline changes Decrease to 0.3/1.5 mg daily.  Rx to mail order.  Pt and I discussed risks and benefits including breast cancer, DVT/PE, stroke, MI.  (If insurance issues will switch to 0.5mg  estradiol and 2.5mg  provera, 1/2 tab of each daily.) Labs with PCP  Declines colonoscopy.  IFOB given.   return annually or prn  An After Visit Summary was printed and given to the patient.

## 2014-08-30 ENCOUNTER — Other Ambulatory Visit: Payer: Self-pay

## 2014-09-04 ENCOUNTER — Other Ambulatory Visit: Payer: Self-pay | Admitting: Family Medicine

## 2014-09-04 LAB — IPS PAP SMEAR ONLY

## 2014-09-04 LAB — FECAL OCCULT BLOOD, IMMUNOCHEMICAL: IFOBT: NEGATIVE

## 2014-09-04 NOTE — Telephone Encounter (Signed)
Med filled.  

## 2014-09-12 DIAGNOSIS — M47816 Spondylosis without myelopathy or radiculopathy, lumbar region: Secondary | ICD-10-CM | POA: Diagnosis not present

## 2014-09-12 DIAGNOSIS — M47812 Spondylosis without myelopathy or radiculopathy, cervical region: Secondary | ICD-10-CM | POA: Diagnosis not present

## 2014-09-12 DIAGNOSIS — M542 Cervicalgia: Secondary | ICD-10-CM | POA: Diagnosis not present

## 2014-09-16 ENCOUNTER — Encounter: Payer: Self-pay | Admitting: Obstetrics & Gynecology

## 2014-09-23 ENCOUNTER — Ambulatory Visit: Payer: Medicare Other | Attending: Physical Medicine and Rehabilitation | Admitting: Physical Therapy

## 2014-09-23 DIAGNOSIS — Z5189 Encounter for other specified aftercare: Secondary | ICD-10-CM | POA: Diagnosis not present

## 2014-09-23 DIAGNOSIS — G20 Parkinson's disease: Secondary | ICD-10-CM | POA: Diagnosis not present

## 2014-09-23 DIAGNOSIS — M545 Low back pain: Secondary | ICD-10-CM | POA: Insufficient documentation

## 2014-09-23 DIAGNOSIS — M47812 Spondylosis without myelopathy or radiculopathy, cervical region: Secondary | ICD-10-CM | POA: Diagnosis not present

## 2014-09-23 DIAGNOSIS — M47816 Spondylosis without myelopathy or radiculopathy, lumbar region: Secondary | ICD-10-CM | POA: Diagnosis not present

## 2014-09-23 DIAGNOSIS — I10 Essential (primary) hypertension: Secondary | ICD-10-CM | POA: Diagnosis not present

## 2014-09-23 DIAGNOSIS — M542 Cervicalgia: Secondary | ICD-10-CM | POA: Insufficient documentation

## 2014-09-25 ENCOUNTER — Ambulatory Visit: Payer: Medicare Other | Admitting: Physical Therapy

## 2014-09-25 DIAGNOSIS — Z5189 Encounter for other specified aftercare: Secondary | ICD-10-CM | POA: Diagnosis not present

## 2014-09-28 ENCOUNTER — Other Ambulatory Visit: Payer: Self-pay | Admitting: Family Medicine

## 2014-09-30 ENCOUNTER — Encounter: Payer: Medicare Other | Admitting: Physical Therapy

## 2014-09-30 ENCOUNTER — Ambulatory Visit: Payer: Medicare Other | Admitting: Physical Therapy

## 2014-09-30 DIAGNOSIS — Z5189 Encounter for other specified aftercare: Secondary | ICD-10-CM | POA: Diagnosis not present

## 2014-09-30 NOTE — Telephone Encounter (Signed)
Med filled and faxed.  

## 2014-09-30 NOTE — Telephone Encounter (Signed)
Last OV 06-10-14 07-24-14 Valium last filled #30 with 0

## 2014-10-01 ENCOUNTER — Telehealth: Payer: Self-pay

## 2014-10-01 MED ORDER — EPINEPHRINE 0.3 MG/0.3ML IJ SOAJ: 0.3000 mg | Freq: Once | INTRAMUSCULAR | Status: DC

## 2014-10-01 NOTE — Telephone Encounter (Signed)
Stephanie [name] [phone] [address]   [name] called to say that she needs a new prescription for the original Epi Pen that Dr Birdie Riddle wrote for her, because the one the insurance would cover is now being recalled, and the company told her if she would get the original one they would pay for it.

## 2014-10-01 NOTE — Telephone Encounter (Signed)
Med filled. Pt notified.  

## 2014-10-02 NOTE — Telephone Encounter (Signed)
error 

## 2014-10-08 ENCOUNTER — Ambulatory Visit: Payer: Medicare Other | Admitting: Physical Therapy

## 2014-10-08 DIAGNOSIS — Z5189 Encounter for other specified aftercare: Secondary | ICD-10-CM | POA: Diagnosis not present

## 2014-10-12 ENCOUNTER — Encounter: Payer: Self-pay | Admitting: Obstetrics & Gynecology

## 2014-10-16 ENCOUNTER — Ambulatory Visit: Payer: Medicare Other | Attending: Physical Medicine and Rehabilitation | Admitting: Physical Therapy

## 2014-10-16 DIAGNOSIS — Z5189 Encounter for other specified aftercare: Secondary | ICD-10-CM | POA: Insufficient documentation

## 2014-10-16 DIAGNOSIS — I10 Essential (primary) hypertension: Secondary | ICD-10-CM | POA: Diagnosis not present

## 2014-10-16 DIAGNOSIS — M545 Low back pain: Secondary | ICD-10-CM | POA: Diagnosis not present

## 2014-10-16 DIAGNOSIS — M542 Cervicalgia: Secondary | ICD-10-CM | POA: Insufficient documentation

## 2014-10-16 DIAGNOSIS — G20 Parkinson's disease: Secondary | ICD-10-CM | POA: Diagnosis not present

## 2014-10-16 DIAGNOSIS — M47812 Spondylosis without myelopathy or radiculopathy, cervical region: Secondary | ICD-10-CM | POA: Insufficient documentation

## 2014-10-16 DIAGNOSIS — M47816 Spondylosis without myelopathy or radiculopathy, lumbar region: Secondary | ICD-10-CM | POA: Diagnosis not present

## 2014-10-21 ENCOUNTER — Telehealth: Payer: Self-pay | Admitting: Obstetrics & Gynecology

## 2014-10-21 NOTE — Telephone Encounter (Signed)
Patient received a letter stating she had not turned in her stool sample. But she says she brought it by the week after her AEX on 08/29/14 and gave it to the ladies in the front office.

## 2014-10-22 NOTE — Telephone Encounter (Signed)
ifob resulted in EPIC using 09/04/14, which was date specimen tested in office.

## 2014-10-22 NOTE — Telephone Encounter (Signed)
Left message to call Kaitlyn at [phone]. 

## 2014-10-22 NOTE — Telephone Encounter (Signed)
Spoke with patient. Advised checked results and records in office for IFOB. Patient's IFOB was resulted and was negative per Colletta [place] and log book. Advised to disregard letter. Patient is agreeable and verbalizes understanding.  Cc: Abelino Derrick  Routing to provider for final review. Patient agreeable to disposition. Will close encounter

## 2014-10-23 ENCOUNTER — Ambulatory Visit: Payer: Medicare Other | Admitting: Physical Therapy

## 2014-10-23 DIAGNOSIS — Z5189 Encounter for other specified aftercare: Secondary | ICD-10-CM | POA: Diagnosis not present

## 2014-10-23 DIAGNOSIS — M542 Cervicalgia: Secondary | ICD-10-CM | POA: Diagnosis not present

## 2014-10-23 DIAGNOSIS — I10 Essential (primary) hypertension: Secondary | ICD-10-CM | POA: Diagnosis not present

## 2014-10-23 DIAGNOSIS — M545 Low back pain: Secondary | ICD-10-CM | POA: Diagnosis not present

## 2014-10-23 DIAGNOSIS — M47812 Spondylosis without myelopathy or radiculopathy, cervical region: Secondary | ICD-10-CM | POA: Diagnosis not present

## 2014-10-23 DIAGNOSIS — M47816 Spondylosis without myelopathy or radiculopathy, lumbar region: Secondary | ICD-10-CM | POA: Diagnosis not present

## 2014-11-01 ENCOUNTER — Ambulatory Visit: Payer: Medicare Other | Admitting: Physical Therapy

## 2014-11-01 DIAGNOSIS — Z5189 Encounter for other specified aftercare: Secondary | ICD-10-CM | POA: Diagnosis not present

## 2014-11-01 DIAGNOSIS — I10 Essential (primary) hypertension: Secondary | ICD-10-CM | POA: Diagnosis not present

## 2014-11-01 DIAGNOSIS — M542 Cervicalgia: Secondary | ICD-10-CM | POA: Diagnosis not present

## 2014-11-01 DIAGNOSIS — M545 Low back pain: Secondary | ICD-10-CM | POA: Diagnosis not present

## 2014-11-01 DIAGNOSIS — M47812 Spondylosis without myelopathy or radiculopathy, cervical region: Secondary | ICD-10-CM | POA: Diagnosis not present

## 2014-11-01 DIAGNOSIS — M47816 Spondylosis without myelopathy or radiculopathy, lumbar region: Secondary | ICD-10-CM | POA: Diagnosis not present

## 2014-11-13 ENCOUNTER — Ambulatory Visit: Payer: Medicare Other | Admitting: Physical Therapy

## 2014-11-27 ENCOUNTER — Other Ambulatory Visit: Payer: Self-pay | Admitting: Family Medicine

## 2014-11-27 NOTE — Telephone Encounter (Signed)
Med filled and faxed.  

## 2014-11-27 NOTE — Telephone Encounter (Signed)
Last OV 06-10-14 Valium last filled 09-30-14 #30 with 0

## 2014-12-03 DIAGNOSIS — G20 Parkinson's disease: Secondary | ICD-10-CM | POA: Diagnosis not present

## 2014-12-10 ENCOUNTER — Encounter: Payer: Self-pay | Admitting: *Deleted

## 2014-12-10 ENCOUNTER — Other Ambulatory Visit: Payer: Self-pay | Admitting: Family Medicine

## 2014-12-10 ENCOUNTER — Telehealth: Payer: Self-pay | Admitting: *Deleted

## 2014-12-10 NOTE — Telephone Encounter (Signed)
Pre-Visit Call:   Medications, allergies, and health history reviewed with patient.  Changes made as necessary.   Pharmacy preference: [id number] - [place], [place] - [address] AT [facility] [address]  Pap:   08/29/2014   OBGYN -Dr. Edwinna Areola normal CCS:  01/22/2003-     patient stated she did Cologard in 2015 and it was negative MMg-  02/06/2014    [facility]-BI-Rads Category 2: Benign BD-    01/12/1999    Patient states Dr. Sabra Heck is following and she is scheduled for 2016 Flu-    08/15/14   UTD        Td-     02/25/14   UTD Pna-  11/24/11 (PNA 23) Zoster- 08/27/11 UTD

## 2014-12-11 ENCOUNTER — Ambulatory Visit (INDEPENDENT_AMBULATORY_CARE_PROVIDER_SITE_OTHER): Payer: Medicare Other | Admitting: Family Medicine

## 2014-12-11 ENCOUNTER — Encounter: Payer: Self-pay | Admitting: Family Medicine

## 2014-12-11 VITALS — BP 128/80 | HR 78 | Temp 97.8°F | Resp 16 | Ht 65.0 in | Wt 125.0 lb

## 2014-12-11 DIAGNOSIS — Z23 Encounter for immunization: Secondary | ICD-10-CM | POA: Diagnosis not present

## 2014-12-11 DIAGNOSIS — E785 Hyperlipidemia, unspecified: Secondary | ICD-10-CM | POA: Diagnosis not present

## 2014-12-11 DIAGNOSIS — Z0000 Encounter for general adult medical examination without abnormal findings: Secondary | ICD-10-CM | POA: Diagnosis not present

## 2014-12-11 DIAGNOSIS — I10 Essential (primary) hypertension: Secondary | ICD-10-CM | POA: Diagnosis not present

## 2014-12-11 LAB — CBC WITH DIFFERENTIAL/PLATELET
Basophils Absolute: 0 10*3/uL (ref 0.0–0.1)
Basophils Relative: 0.4 % (ref 0.0–3.0)
EOS PCT: 2.5 % (ref 0.0–5.0)
Eosinophils Absolute: 0.1 10*3/uL (ref 0.0–0.7)
HCT: 42.1 % (ref 36.0–46.0)
Hemoglobin: 14.4 g/dL (ref 12.0–15.0)
LYMPHS ABS: 1.2 10*3/uL (ref 0.7–4.0)
LYMPHS PCT: 20.7 % (ref 12.0–46.0)
MCHC: 34.1 g/dL (ref 30.0–36.0)
MCV: 88.4 fl (ref 78.0–100.0)
MONO ABS: 0.4 10*3/uL (ref 0.1–1.0)
Monocytes Relative: 7.4 % (ref 3.0–12.0)
NEUTROS ABS: 4 10*3/uL (ref 1.4–7.7)
Neutrophils Relative %: 69 % (ref 43.0–77.0)
PLATELETS: 199 10*3/uL (ref 150.0–400.0)
RBC: 4.76 Mil/uL (ref 3.87–5.11)
RDW: 13.5 % (ref 11.5–15.5)
WBC: 5.8 10*3/uL (ref 4.0–10.5)

## 2014-12-11 LAB — BASIC METABOLIC PANEL
BUN: 21 mg/dL (ref 6–23)
CO2: 30 meq/L (ref 19–32)
Calcium: 9.9 mg/dL (ref 8.4–10.5)
Chloride: 99 mEq/L (ref 96–112)
Creatinine, Ser: 0.71 mg/dL (ref 0.40–1.20)
GFR: 83.99 mL/min (ref >60.00)
Glucose, Bld: 96 mg/dL (ref 70–99)
POTASSIUM: 3.9 meq/L (ref 3.5–5.1)
Sodium: 135 mEq/L (ref 135–145)

## 2014-12-11 LAB — HEPATIC FUNCTION PANEL
ALBUMIN: 4.4 g/dL (ref 3.5–5.2)
ALT: 14 U/L (ref 0–35)
AST: 28 U/L (ref 0–37)
Alkaline Phosphatase: 40 U/L (ref 39–117)
Bilirubin, Direct: 0.1 mg/dL (ref 0.0–0.3)
TOTAL PROTEIN: 6.7 g/dL (ref 6.0–8.3)
Total Bilirubin: 0.5 mg/dL (ref 0.2–1.2)

## 2014-12-11 LAB — LIPID PANEL
CHOL/HDL RATIO: 4
Cholesterol: 185 mg/dL (ref 0–200)
HDL: 51.1 mg/dL (ref >39.00)
NonHDL: 133.9
Triglycerides: 214 mg/dL — ABNORMAL HIGH (ref 0.0–149.0)
VLDL: 42.8 mg/dL — AB (ref 0.0–40.0)

## 2014-12-11 LAB — TSH: TSH: 1.06 u[IU]/mL (ref 0.35–4.50)

## 2014-12-11 LAB — LDL CHOLESTEROL, DIRECT: Direct LDL: 120 mg/dL

## 2014-12-11 NOTE — Telephone Encounter (Signed)
Med filled.  

## 2014-12-11 NOTE — Progress Notes (Signed)
   Subjective:    Patient ID: Stephanie [name], female    DOB: 1932/08/29, 81 y.o.   MRN: [id number]  HPI Here today for CPE.  Risk Factors: HTN- chronic problem, on HCTZ.  Denies CP, SOB, HAs, visual changes, edema Physical Activity: exercising regularly, walking, weight lifting, yoga Fall Risk: low risk Depression: chronic problem, on Zoloft. Hearing: normal to conversational tones, mildly decreased to whispered voice at 6 ft ADL's: independent Cognitive: normal linear thought process, memory and attention intact Home Safety: safe at home Height, Weight, BMI, Visual Acuity: see vitals, vision corrected to 20/20 w/ glasses Counseling: due for colonoscopy, pt declines and reports iFOB at GYN, UTD on mammo, DEXA Labs Ordered: See A&P Care Plan: See A&P    Review of Systems Patient reports no vision/ hearing changes, adenopathy,fever, weight change,  persistant/recurrent hoarseness , swallowing issues, chest pain, palpitations, edema, persistant/recurrent cough, hemoptysis, dyspnea (rest/exertional/paroxysmal nocturnal), gastrointestinal bleeding (melena, rectal bleeding), abdominal pain, significant heartburn, bowel changes, GU symptoms (dysuria, hematuria, incontinence), Gyn symptoms (abnormal  bleeding, pain),  syncope, focal weakness, memory loss, numbness & tingling, skin/hair/nail changes, abnormal bruising or bleeding, anxiety, or depression.   Reviewed meds, allergies, problem list, and PMH in chart     Objective:   Physical Exam General Appearance:    Alert, cooperative, no distress, appears stated age  Head:    Normocephalic, without obvious abnormality, atraumatic  Eyes:    PERRL, conjunctiva/corneas clear, EOM's intact, fundi    benign, both eyes  Ears:    Normal TM's and external ear canals, both ears  Nose:   Nares normal, septum midline, mucosa normal, no drainage    or sinus tenderness  Throat:   Lips, mucosa, and tongue normal; teeth and gums normal  Neck:   Supple,  symmetrical, trachea midline, no adenopathy;    Thyroid: no enlargement/tenderness/nodules  Back:     Symmetric, no curvature, ROM normal, no CVA tenderness  Lungs:     Clear to auscultation bilaterally, respirations unlabored  Chest Wall:    No tenderness or deformity   Heart:    Regular rate and rhythm, S1 and S2 normal, no murmur, rub   or gallop  Breast Exam:    Deferred to GYN  Abdomen:     Soft, non-tender, bowel sounds active all four quadrants,    no masses, no organomegaly  Genitalia:    Deferred to GYN  Rectal:    Extremities:   Extremities normal, atraumatic, no cyanosis or edema  Pulses:   2+ and symmetric all extremities  Skin:   Skin color, texture, turgor normal, no rashes or lesions  Lymph nodes:   Cervical, supraclavicular, and axillary nodes normal  Neurologic:   CNII-XII intact, normal strength, sensation and reflexes    throughout          Assessment & Plan:

## 2014-12-11 NOTE — Progress Notes (Signed)
Pre visit review using our clinic review tool, if applicable. No additional management support is needed unless otherwise documented below in the visit note. 

## 2014-12-11 NOTE — Patient Instructions (Signed)
Follow up in 6 months to recheck BP We'll notify you of your lab results and make any changes if needed Keep up the good work!  You look great! Call with any questions or concerns Happy Early Birthday!!!

## 2014-12-15 DIAGNOSIS — E785 Hyperlipidemia, unspecified: Secondary | ICD-10-CM | POA: Insufficient documentation

## 2014-12-15 NOTE — Assessment & Plan Note (Signed)
Pt has hx of elevated triglycerides more than total cholesterol or LDL.  Attempting to control w/ healthy diet and regular exercise.  Check labs.  Start meds prn.

## 2014-12-15 NOTE — Assessment & Plan Note (Signed)
Pt's PE WNL.  UTD on GYN, due for colonoscopy but declines.  Had iFOB at GYN.  Written screening schedule updated and given to pt.  Check labs.  Anticipatory guidance provided.

## 2014-12-15 NOTE — Assessment & Plan Note (Signed)
Chronic problem.  On HCTZ daily.  Asymptomatic.  Check labs.  No anticipated med changes.

## 2014-12-30 ENCOUNTER — Telehealth: Payer: Self-pay | Admitting: Family Medicine

## 2014-12-30 NOTE — Telephone Encounter (Signed)
Caller name: Sister Relation to pt: self Call back number: [phone] Pharmacy:  Reason for call:   Patient states that she has never received lab results from January ov.

## 2014-12-31 NOTE — Telephone Encounter (Signed)
Pt notified of lab results, advised that they had been sent to her mychart account.

## 2015-01-25 ENCOUNTER — Other Ambulatory Visit: Payer: Self-pay | Admitting: Family Medicine

## 2015-01-27 ENCOUNTER — Telehealth: Payer: Self-pay | Admitting: Obstetrics & Gynecology

## 2015-01-27 NOTE — Telephone Encounter (Signed)
Patient is currently taking Prempro 0.3-1.5 mg tablet per day. Patient would like to switch to alternative due to cost. Routing to Dr. Sabra Heck for review and advise.

## 2015-01-27 NOTE — Telephone Encounter (Signed)
Last OV 12-10-14 Valium last filled 11-27-14 #30 with 0

## 2015-01-27 NOTE — Telephone Encounter (Signed)
She is on such a low dose of HRT does she want to consider just stopping it altogether?

## 2015-01-27 NOTE — Telephone Encounter (Signed)
Spoke with patient. Advised of message as seen below from [place]. Patient is agreeable. Patient states that she has enough Prempro left for two weeks. Advised patient may stop all together or take every other day for two weeks and then stop. Patient is agreeable. Will call if she has any further side effects while coming off of Prempro.  Routing to provider for final review. Patient agreeable to disposition. Will close encounter

## 2015-01-27 NOTE — Telephone Encounter (Signed)
Patient is asking to change her Prempro to a generic. Confirmed pharmacy with patient.

## 2015-01-27 NOTE — Telephone Encounter (Signed)
Med filled and faxed.  

## 2015-02-18 DIAGNOSIS — M545 Low back pain: Secondary | ICD-10-CM | POA: Diagnosis not present

## 2015-02-18 DIAGNOSIS — M47816 Spondylosis without myelopathy or radiculopathy, lumbar region: Secondary | ICD-10-CM | POA: Diagnosis not present

## 2015-02-18 DIAGNOSIS — M47812 Spondylosis without myelopathy or radiculopathy, cervical region: Secondary | ICD-10-CM | POA: Diagnosis not present

## 2015-02-27 ENCOUNTER — Telehealth: Payer: Self-pay | Admitting: Obstetrics & Gynecology

## 2015-02-27 DIAGNOSIS — Z1231 Encounter for screening mammogram for malignant neoplasm of breast: Secondary | ICD-10-CM | POA: Diagnosis not present

## 2015-02-27 LAB — HM MAMMOGRAPHY

## 2015-02-27 NOTE — Telephone Encounter (Signed)
Pt of Dr Sabra Heck states she came of prempro as suggested and is now not sleeping well and having hot flashes. Told to report symptoms.

## 2015-02-28 MED ORDER — MEDROXYPROGESTERONE ACETATE 2.5 MG PO TABS: 1.2500 mg | ORAL_TABLET | Freq: Every day | ORAL | Status: DC

## 2015-02-28 MED ORDER — ESTRADIOL 0.5 MG PO TABS: 0.2500 mg | ORAL_TABLET | Freq: Every day | ORAL | Status: DC

## 2015-02-28 NOTE — Telephone Encounter (Signed)
Message left to return call to Tonnya Garbett at [phone].    

## 2015-02-28 NOTE — Telephone Encounter (Signed)
Spoke with patient. She started taking prempro every other day and was doing well. Completely stopped one week ago and symptoms of hot flashes and difficulty started with stopping Prempro completely.  Wants to continue HRT.  Patient would like advise from Dr. Sabra Heck about what to do, she states at last annual discussed an alternative treatment because insurance is not covering Prempro. Okay to change to estradiol and provera as planned at last annual?

## 2015-02-28 NOTE — Telephone Encounter (Signed)
Spoke with patient and message from Dr Sabra Heck given.  She is advised of instructions and to call back with any concerns.  Advised oral estradiol likely will not be covered by insurance. Will need to pay cash for prescription. Advised usually $4.00 generic so if costly at [facility], please let us know.  Patient agreeable to plan and will call back as needed.   Orders placed to [facility]. Routing to provider for final review. Patient agreeable to disposition. Will close encounter

## 2015-02-28 NOTE — Telephone Encounter (Signed)
Returning a call to Tracy °

## 2015-02-28 NOTE — Telephone Encounter (Signed)
Yes.  Ok to switch to estradiol 0.5mg  and provera 2.5mg .  She should take 1/2 tab of each daily.  Thanks.

## 2015-03-04 ENCOUNTER — Encounter: Payer: Self-pay | Admitting: General Practice

## 2015-03-05 DIAGNOSIS — M542 Cervicalgia: Secondary | ICD-10-CM | POA: Diagnosis not present

## 2015-03-05 DIAGNOSIS — M47816 Spondylosis without myelopathy or radiculopathy, lumbar region: Secondary | ICD-10-CM | POA: Diagnosis not present

## 2015-03-05 DIAGNOSIS — M47812 Spondylosis without myelopathy or radiculopathy, cervical region: Secondary | ICD-10-CM | POA: Diagnosis not present

## 2015-03-06 DIAGNOSIS — R928 Other abnormal and inconclusive findings on diagnostic imaging of breast: Secondary | ICD-10-CM | POA: Diagnosis not present

## 2015-03-06 DIAGNOSIS — R922 Inconclusive mammogram: Secondary | ICD-10-CM | POA: Diagnosis not present

## 2015-03-06 LAB — HM MAMMOGRAPHY

## 2015-03-11 ENCOUNTER — Encounter: Payer: Self-pay | Admitting: General Practice

## 2015-03-16 ENCOUNTER — Other Ambulatory Visit: Payer: Self-pay | Admitting: Family Medicine

## 2015-03-17 NOTE — Telephone Encounter (Signed)
Med filled.  

## 2015-04-07 ENCOUNTER — Other Ambulatory Visit: Payer: Self-pay | Admitting: Family Medicine

## 2015-04-07 NOTE — Telephone Encounter (Signed)
Med filled.  

## 2015-06-03 DIAGNOSIS — L219 Seborrheic dermatitis, unspecified: Secondary | ICD-10-CM | POA: Diagnosis not present

## 2015-06-04 DIAGNOSIS — G20 Parkinson's disease: Secondary | ICD-10-CM | POA: Diagnosis not present

## 2015-06-11 ENCOUNTER — Ambulatory Visit (INDEPENDENT_AMBULATORY_CARE_PROVIDER_SITE_OTHER): Payer: Medicare Other | Admitting: Family Medicine

## 2015-06-11 ENCOUNTER — Encounter: Payer: Self-pay | Admitting: Family Medicine

## 2015-06-11 VITALS — BP 118/76 | HR 67 | Temp 97.6°F | Ht 65.0 in | Wt 124.4 lb

## 2015-06-11 DIAGNOSIS — E785 Hyperlipidemia, unspecified: Secondary | ICD-10-CM

## 2015-06-11 DIAGNOSIS — I10 Essential (primary) hypertension: Secondary | ICD-10-CM | POA: Diagnosis not present

## 2015-06-11 LAB — CBC WITH DIFFERENTIAL/PLATELET
BASOS PCT: 0.2 % (ref 0.0–3.0)
Basophils Absolute: 0 10*3/uL (ref 0.0–0.1)
Eosinophils Absolute: 0.2 10*3/uL (ref 0.0–0.7)
Eosinophils Relative: 3.4 % (ref 0.0–5.0)
HEMATOCRIT: 42.9 % (ref 36.0–46.0)
HEMOGLOBIN: 14.7 g/dL (ref 12.0–15.0)
LYMPHS PCT: 20 % (ref 12.0–46.0)
Lymphs Abs: 1.1 10*3/uL (ref 0.7–4.0)
MCHC: 34.3 g/dL (ref 30.0–36.0)
MCV: 90.2 fl (ref 78.0–100.0)
Monocytes Absolute: 0.5 10*3/uL (ref 0.1–1.0)
Monocytes Relative: 8.5 % (ref 3.0–12.0)
Neutro Abs: 3.9 10*3/uL (ref 1.4–7.7)
Neutrophils Relative %: 67.9 % (ref 43.0–77.0)
PLATELETS: 186 10*3/uL (ref 150.0–400.0)
RBC: 4.75 Mil/uL (ref 3.87–5.11)
RDW: 14 % (ref 11.5–15.5)
WBC: 5.7 10*3/uL (ref 4.0–10.5)

## 2015-06-11 LAB — LIPID PANEL
CHOLESTEROL: 186 mg/dL (ref 0–200)
HDL: 46 mg/dL (ref >39.00)
LDL CALC: 117 mg/dL — AB (ref 0–99)
NONHDL: 140
TRIGLYCERIDES: 117 mg/dL (ref 0.0–149.0)
Total CHOL/HDL Ratio: 4
VLDL: 23.4 mg/dL (ref 0.0–40.0)

## 2015-06-11 LAB — HEPATIC FUNCTION PANEL
ALBUMIN: 4.6 g/dL (ref 3.5–5.2)
ALT: 9 U/L (ref 0–35)
AST: 30 U/L (ref 0–37)
Alkaline Phosphatase: 49 U/L (ref 39–117)
Bilirubin, Direct: 0.1 mg/dL (ref 0.0–0.3)
Total Bilirubin: 0.7 mg/dL (ref 0.2–1.2)
Total Protein: 6.8 g/dL (ref 6.0–8.3)

## 2015-06-11 LAB — BASIC METABOLIC PANEL
BUN: 23 mg/dL (ref 6–23)
CHLORIDE: 98 meq/L (ref 96–112)
CO2: 33 mEq/L — ABNORMAL HIGH (ref 19–32)
Calcium: 9.9 mg/dL (ref 8.4–10.5)
Creatinine, Ser: 0.75 mg/dL (ref 0.40–1.20)
GFR: 78.75 mL/min (ref >60.00)
Glucose, Bld: 88 mg/dL (ref 70–99)
Potassium: 3.8 mEq/L (ref 3.5–5.1)
Sodium: 137 mEq/L (ref 135–145)

## 2015-06-11 NOTE — Progress Notes (Signed)
Pre visit review using our clinic review tool, if applicable. No additional management support is needed unless otherwise documented below in the visit note. 

## 2015-06-11 NOTE — Patient Instructions (Signed)
Schedule your complete physical in 6 months We'll notify you of your lab results and make any changes if needed Keep up the good work!  You look great! Call with any questions or concerns Hang in there!!  Let me know if we can help in any way!

## 2015-06-11 NOTE — Progress Notes (Signed)
   Subjective:    Patient ID: Stephanie [name], female    DOB: 01/17/1932, 81 y.o.   MRN: [id number]  HPI HTN- chronic problem, on HCTZ.  Well controlled.  No CP, SOB, HAs, visual changes, edema  Hyperlipidemia- chronic problem.  Attempting to control w/ healthy diet and regular exercise.  Denies abd pain, N/V.   Review of Systems For ROS see HPI     Objective:   Physical Exam  Constitutional: She is oriented to person, place, and time. She appears well-developed and well-nourished. No distress.  HENT:  Head: Normocephalic and atraumatic.  Eyes: Conjunctivae and EOM are normal. Pupils are equal, round, and reactive to light.  Neck: Normal range of motion. Neck supple. No thyromegaly present.  Cardiovascular: Normal rate, regular rhythm, normal heart sounds and intact distal pulses.   No murmur heard. Pulmonary/Chest: Effort normal and breath sounds normal. No respiratory distress.  Abdominal: Soft. She exhibits no distension. There is no tenderness.  Musculoskeletal: She exhibits no edema.  Lymphadenopathy:    She has no cervical adenopathy.  Neurological: She is alert and oriented to person, place, and time.  Skin: Skin is warm and dry.  Psychiatric: She has a normal mood and affect. Her behavior is normal.  Vitals reviewed.         Assessment & Plan:

## 2015-06-12 ENCOUNTER — Other Ambulatory Visit: Payer: Self-pay | Admitting: Family Medicine

## 2015-06-12 NOTE — Telephone Encounter (Signed)
OK to print a 30 day supply

## 2015-06-12 NOTE — Telephone Encounter (Signed)
Pt is requesting refill on Diazepam. Dr. Virgil Benedict Pt.  Last OV: 06/11/2015 Last Fill: 01/27/2015 #30 3RF UDS: 11/24/2012 Low risk   Please advise.

## 2015-06-12 NOTE — Telephone Encounter (Signed)
OK to refill with 30 day supply no extra refills, please print

## 2015-06-12 NOTE — Telephone Encounter (Signed)
Rx printed, awaiting MD signature.  

## 2015-06-13 NOTE — Assessment & Plan Note (Signed)
Chronic problem.  Well controlled.  Asymptomatic.  Check labs.  No anticipated med changes.  Will follow. 

## 2015-06-13 NOTE — Assessment & Plan Note (Signed)
Chronic problem.  Attempting w/ healthy diet and regular exercise.  Check labs.  Start meds prn.

## 2015-06-17 DIAGNOSIS — M47812 Spondylosis without myelopathy or radiculopathy, cervical region: Secondary | ICD-10-CM | POA: Diagnosis not present

## 2015-06-17 DIAGNOSIS — M47816 Spondylosis without myelopathy or radiculopathy, lumbar region: Secondary | ICD-10-CM | POA: Diagnosis not present

## 2015-07-04 ENCOUNTER — Other Ambulatory Visit: Payer: Self-pay | Admitting: Family Medicine

## 2015-07-04 NOTE — Telephone Encounter (Signed)
Medication filled to pharmacy as requested.   

## 2015-07-09 ENCOUNTER — Telehealth: Payer: Self-pay | Admitting: Family Medicine

## 2015-07-09 NOTE — Telephone Encounter (Signed)
Pt called in with questions about Dr. Virgil Benedict leave. She says that her husband is also a pt of Dr. Birdie Riddle.  She is requesting to speak with you directly.     Husband MRN is [id number]   [place]: [phone]

## 2015-07-10 NOTE — Telephone Encounter (Signed)
Dr Blyth 

## 2015-07-10 NOTE — Telephone Encounter (Signed)
Dr. B was already talked to about pt and husbands condition.

## 2015-07-10 NOTE — Telephone Encounter (Signed)
Called pt and cpe rescheduled with Dr. Charlett Blake for next year (also her husband).

## 2015-07-10 NOTE — Telephone Encounter (Signed)
Called pt and LMOVM to return call.  °

## 2015-07-10 NOTE — Telephone Encounter (Signed)
Pt called back. She is wanting to know Dr. Virgil Benedict recommendation for a female provider. I gave her names and basic info on the other 3 female providers but she would still like a recommendation from Dr. Birdie Riddle.

## 2015-07-11 NOTE — Telephone Encounter (Signed)
Noted  

## 2015-07-16 DIAGNOSIS — L409 Psoriasis, unspecified: Secondary | ICD-10-CM | POA: Diagnosis not present

## 2015-07-23 DIAGNOSIS — M47812 Spondylosis without myelopathy or radiculopathy, cervical region: Secondary | ICD-10-CM | POA: Diagnosis not present

## 2015-07-23 DIAGNOSIS — M47816 Spondylosis without myelopathy or radiculopathy, lumbar region: Secondary | ICD-10-CM | POA: Diagnosis not present

## 2015-07-23 DIAGNOSIS — M5416 Radiculopathy, lumbar region: Secondary | ICD-10-CM | POA: Diagnosis not present

## 2015-07-23 DIAGNOSIS — M545 Low back pain: Secondary | ICD-10-CM | POA: Diagnosis not present

## 2015-07-30 ENCOUNTER — Other Ambulatory Visit: Payer: Self-pay | Admitting: Family Medicine

## 2015-07-30 NOTE — Telephone Encounter (Signed)
Last OV 06/11/15 Valium last filled 06/12/15 #30 with 0

## 2015-07-30 NOTE — Telephone Encounter (Signed)
Medication filled to pharmacy as requested.   

## 2015-08-06 DIAGNOSIS — M47816 Spondylosis without myelopathy or radiculopathy, lumbar region: Secondary | ICD-10-CM | POA: Diagnosis not present

## 2015-08-06 DIAGNOSIS — M47812 Spondylosis without myelopathy or radiculopathy, cervical region: Secondary | ICD-10-CM | POA: Diagnosis not present

## 2015-08-19 DIAGNOSIS — Z23 Encounter for immunization: Secondary | ICD-10-CM | POA: Diagnosis not present

## 2015-09-08 ENCOUNTER — Other Ambulatory Visit: Payer: Self-pay | Admitting: Family Medicine

## 2015-09-08 NOTE — Telephone Encounter (Signed)
Medication filled to pharmacy as requested.   

## 2015-09-10 DIAGNOSIS — L409 Psoriasis, unspecified: Secondary | ICD-10-CM | POA: Diagnosis not present

## 2015-09-10 DIAGNOSIS — L821 Other seborrheic keratosis: Secondary | ICD-10-CM | POA: Diagnosis not present

## 2015-09-10 DIAGNOSIS — D225 Melanocytic nevi of trunk: Secondary | ICD-10-CM | POA: Diagnosis not present

## 2015-09-10 DIAGNOSIS — Z872 Personal history of diseases of the skin and subcutaneous tissue: Secondary | ICD-10-CM | POA: Diagnosis not present

## 2015-09-15 DIAGNOSIS — S138XXA Sprain of joints and ligaments of other parts of neck, initial encounter: Secondary | ICD-10-CM | POA: Diagnosis not present

## 2015-09-15 DIAGNOSIS — M9903 Segmental and somatic dysfunction of lumbar region: Secondary | ICD-10-CM | POA: Diagnosis not present

## 2015-09-15 DIAGNOSIS — S39012A Strain of muscle, fascia and tendon of lower back, initial encounter: Secondary | ICD-10-CM | POA: Diagnosis not present

## 2015-09-15 DIAGNOSIS — M9902 Segmental and somatic dysfunction of thoracic region: Secondary | ICD-10-CM | POA: Diagnosis not present

## 2015-09-15 DIAGNOSIS — M9901 Segmental and somatic dysfunction of cervical region: Secondary | ICD-10-CM | POA: Diagnosis not present

## 2015-09-15 DIAGNOSIS — S29012A Strain of muscle and tendon of back wall of thorax, initial encounter: Secondary | ICD-10-CM | POA: Diagnosis not present

## 2015-09-18 DIAGNOSIS — S29012A Strain of muscle and tendon of back wall of thorax, initial encounter: Secondary | ICD-10-CM | POA: Diagnosis not present

## 2015-09-18 DIAGNOSIS — M9902 Segmental and somatic dysfunction of thoracic region: Secondary | ICD-10-CM | POA: Diagnosis not present

## 2015-09-18 DIAGNOSIS — M9901 Segmental and somatic dysfunction of cervical region: Secondary | ICD-10-CM | POA: Diagnosis not present

## 2015-09-18 DIAGNOSIS — S39012A Strain of muscle, fascia and tendon of lower back, initial encounter: Secondary | ICD-10-CM | POA: Diagnosis not present

## 2015-09-18 DIAGNOSIS — M9903 Segmental and somatic dysfunction of lumbar region: Secondary | ICD-10-CM | POA: Diagnosis not present

## 2015-09-18 DIAGNOSIS — S138XXA Sprain of joints and ligaments of other parts of neck, initial encounter: Secondary | ICD-10-CM | POA: Diagnosis not present

## 2015-09-22 ENCOUNTER — Other Ambulatory Visit: Payer: Self-pay | Admitting: Family Medicine

## 2015-09-23 DIAGNOSIS — M9902 Segmental and somatic dysfunction of thoracic region: Secondary | ICD-10-CM | POA: Diagnosis not present

## 2015-09-23 DIAGNOSIS — M9903 Segmental and somatic dysfunction of lumbar region: Secondary | ICD-10-CM | POA: Diagnosis not present

## 2015-09-23 DIAGNOSIS — S29012A Strain of muscle and tendon of back wall of thorax, initial encounter: Secondary | ICD-10-CM | POA: Diagnosis not present

## 2015-09-23 DIAGNOSIS — M9901 Segmental and somatic dysfunction of cervical region: Secondary | ICD-10-CM | POA: Diagnosis not present

## 2015-09-23 DIAGNOSIS — S138XXA Sprain of joints and ligaments of other parts of neck, initial encounter: Secondary | ICD-10-CM | POA: Diagnosis not present

## 2015-09-23 DIAGNOSIS — S39012A Strain of muscle, fascia and tendon of lower back, initial encounter: Secondary | ICD-10-CM | POA: Diagnosis not present

## 2015-09-23 NOTE — Telephone Encounter (Signed)
Last OV 06/11/15 Valium last filled 07/30/15 #30 with 0

## 2015-09-23 NOTE — Telephone Encounter (Signed)
Medication filled to pharmacy as requested.   

## 2015-09-25 DIAGNOSIS — M9903 Segmental and somatic dysfunction of lumbar region: Secondary | ICD-10-CM | POA: Diagnosis not present

## 2015-09-25 DIAGNOSIS — M9902 Segmental and somatic dysfunction of thoracic region: Secondary | ICD-10-CM | POA: Diagnosis not present

## 2015-09-25 DIAGNOSIS — M9901 Segmental and somatic dysfunction of cervical region: Secondary | ICD-10-CM | POA: Diagnosis not present

## 2015-09-25 DIAGNOSIS — S39012A Strain of muscle, fascia and tendon of lower back, initial encounter: Secondary | ICD-10-CM | POA: Diagnosis not present

## 2015-09-25 DIAGNOSIS — S29012A Strain of muscle and tendon of back wall of thorax, initial encounter: Secondary | ICD-10-CM | POA: Diagnosis not present

## 2015-09-25 DIAGNOSIS — S138XXA Sprain of joints and ligaments of other parts of neck, initial encounter: Secondary | ICD-10-CM | POA: Diagnosis not present

## 2015-09-29 DIAGNOSIS — M9902 Segmental and somatic dysfunction of thoracic region: Secondary | ICD-10-CM | POA: Diagnosis not present

## 2015-09-29 DIAGNOSIS — M47812 Spondylosis without myelopathy or radiculopathy, cervical region: Secondary | ICD-10-CM | POA: Diagnosis not present

## 2015-09-29 DIAGNOSIS — M9901 Segmental and somatic dysfunction of cervical region: Secondary | ICD-10-CM | POA: Diagnosis not present

## 2015-09-29 DIAGNOSIS — M4316 Spondylolisthesis, lumbar region: Secondary | ICD-10-CM | POA: Diagnosis not present

## 2015-09-29 DIAGNOSIS — S29012A Strain of muscle and tendon of back wall of thorax, initial encounter: Secondary | ICD-10-CM | POA: Diagnosis not present

## 2015-09-29 DIAGNOSIS — M9903 Segmental and somatic dysfunction of lumbar region: Secondary | ICD-10-CM | POA: Diagnosis not present

## 2015-10-02 DIAGNOSIS — M9901 Segmental and somatic dysfunction of cervical region: Secondary | ICD-10-CM | POA: Diagnosis not present

## 2015-10-02 DIAGNOSIS — M9902 Segmental and somatic dysfunction of thoracic region: Secondary | ICD-10-CM | POA: Diagnosis not present

## 2015-10-02 DIAGNOSIS — M9903 Segmental and somatic dysfunction of lumbar region: Secondary | ICD-10-CM | POA: Diagnosis not present

## 2015-10-02 DIAGNOSIS — S29012A Strain of muscle and tendon of back wall of thorax, initial encounter: Secondary | ICD-10-CM | POA: Diagnosis not present

## 2015-10-02 DIAGNOSIS — M47812 Spondylosis without myelopathy or radiculopathy, cervical region: Secondary | ICD-10-CM | POA: Diagnosis not present

## 2015-10-02 DIAGNOSIS — M4316 Spondylolisthesis, lumbar region: Secondary | ICD-10-CM | POA: Diagnosis not present

## 2015-10-03 ENCOUNTER — Other Ambulatory Visit: Payer: Self-pay | Admitting: Family Medicine

## 2015-10-03 NOTE — Telephone Encounter (Signed)
Medication filled to pharmacy as requested.   

## 2015-10-07 DIAGNOSIS — S29012A Strain of muscle and tendon of back wall of thorax, initial encounter: Secondary | ICD-10-CM | POA: Diagnosis not present

## 2015-10-07 DIAGNOSIS — M9903 Segmental and somatic dysfunction of lumbar region: Secondary | ICD-10-CM | POA: Diagnosis not present

## 2015-10-07 DIAGNOSIS — M9902 Segmental and somatic dysfunction of thoracic region: Secondary | ICD-10-CM | POA: Diagnosis not present

## 2015-10-07 DIAGNOSIS — M47812 Spondylosis without myelopathy or radiculopathy, cervical region: Secondary | ICD-10-CM | POA: Diagnosis not present

## 2015-10-07 DIAGNOSIS — M9901 Segmental and somatic dysfunction of cervical region: Secondary | ICD-10-CM | POA: Diagnosis not present

## 2015-10-07 DIAGNOSIS — M4316 Spondylolisthesis, lumbar region: Secondary | ICD-10-CM | POA: Diagnosis not present

## 2015-10-14 DIAGNOSIS — M4316 Spondylolisthesis, lumbar region: Secondary | ICD-10-CM | POA: Diagnosis not present

## 2015-10-14 DIAGNOSIS — M9902 Segmental and somatic dysfunction of thoracic region: Secondary | ICD-10-CM | POA: Diagnosis not present

## 2015-10-14 DIAGNOSIS — M47812 Spondylosis without myelopathy or radiculopathy, cervical region: Secondary | ICD-10-CM | POA: Diagnosis not present

## 2015-10-14 DIAGNOSIS — S29012A Strain of muscle and tendon of back wall of thorax, initial encounter: Secondary | ICD-10-CM | POA: Diagnosis not present

## 2015-10-14 DIAGNOSIS — M9903 Segmental and somatic dysfunction of lumbar region: Secondary | ICD-10-CM | POA: Diagnosis not present

## 2015-10-14 DIAGNOSIS — M9901 Segmental and somatic dysfunction of cervical region: Secondary | ICD-10-CM | POA: Diagnosis not present

## 2015-10-23 DIAGNOSIS — M9902 Segmental and somatic dysfunction of thoracic region: Secondary | ICD-10-CM | POA: Diagnosis not present

## 2015-10-23 DIAGNOSIS — M47812 Spondylosis without myelopathy or radiculopathy, cervical region: Secondary | ICD-10-CM | POA: Diagnosis not present

## 2015-10-23 DIAGNOSIS — S29012A Strain of muscle and tendon of back wall of thorax, initial encounter: Secondary | ICD-10-CM | POA: Diagnosis not present

## 2015-10-23 DIAGNOSIS — M9901 Segmental and somatic dysfunction of cervical region: Secondary | ICD-10-CM | POA: Diagnosis not present

## 2015-10-23 DIAGNOSIS — M4316 Spondylolisthesis, lumbar region: Secondary | ICD-10-CM | POA: Diagnosis not present

## 2015-10-23 DIAGNOSIS — M9903 Segmental and somatic dysfunction of lumbar region: Secondary | ICD-10-CM | POA: Diagnosis not present

## 2015-10-28 ENCOUNTER — Ambulatory Visit (INDEPENDENT_AMBULATORY_CARE_PROVIDER_SITE_OTHER): Payer: Medicare Other | Admitting: Obstetrics & Gynecology

## 2015-10-28 ENCOUNTER — Encounter: Payer: Self-pay | Admitting: Obstetrics & Gynecology

## 2015-10-28 VITALS — BP 122/60 | HR 84 | Resp 18 | Ht 64.0 in | Wt 124.0 lb

## 2015-10-28 DIAGNOSIS — Z01419 Encounter for gynecological examination (general) (routine) without abnormal findings: Secondary | ICD-10-CM | POA: Diagnosis not present

## 2015-10-28 DIAGNOSIS — Z124 Encounter for screening for malignant neoplasm of cervix: Secondary | ICD-10-CM

## 2015-10-28 MED ORDER — CONJ ESTROG-MEDROXYPROGEST ACE 0.3-1.5 MG PO TABS: 1.0000 | ORAL_TABLET | Freq: Every day | ORAL | Status: DC

## 2015-10-28 NOTE — Progress Notes (Signed)
81 y.o. G2P1 MarriedCaucasianF here for annual exam.  Pt reports she is having some issues with back and neck pain.  She is seeing Dr. Nelva Bush.  She is also seeing a Restaurant manager, fast food.  She feels this is helping some.     Husband, Mikki Santee, is having progressive dementia.  Pt is still very mad at him.    Denies vaginal bleeding.    PCP:  Dr. Randel Pigg.   Neurologist:  Dr. Linus Mako at [facility].  Is being seen every six months.   Patient's last menstrual period was 01/13/2013.          Sexually active: No.  The current method of family planning is post menopausal status.    Exercising: Yes.    Walking Smoker:  no  Health Maintenance: Pap:  08/29/14 Neg History of abnormal Pap:  yes MMG:  03/06/15 BIRADS2:Benign  Colonoscopy:  01/2003 repeat 10 years  BMD:   11/2012  TDaP:  2015 Screening Labs: PCP, Hb today: PCP, Urine today: PCP   reports that she quit smoking about 49 years ago. She has never used smokeless tobacco. She reports that she drinks about 2.0 oz of alcohol per week. She reports that she does not use illicit drugs.  Past Medical History  Diagnosis Date  . Hypertension   . Situational stress   . Parkinson disease ([place]) 2012  . Vertigo     took antivert for a couple days  . OA (osteoarthritis)     [facility]  . Back pain     due to osteoarthritis  . Dry scalp     Past Surgical History  Procedure Laterality Date  . Tonsillectomy and adenoidectomy    . Dilation and curettage of uterus    . Cystectomy      wrist  . Laparoscopy      secondary infertility   . Hand surgery    . Cataract extraction    . Hysteroscopy      resection of polyp  . Biopsy thyroid    . Wrist cyst      right    Current Outpatient Prescriptions  Medication Sig Dispense Refill  . Calcium-Vitamin D-Vitamin K (CALCIUM + D + K PO) Take 600 mg by mouth daily.      . carbidopa-levodopa (SINEMET IR) 25-100 MG per tablet 3 (three) times daily.    . Cholecalciferol (VITAMIN D3)  2000 UNITS TABS Take by mouth daily.      . clobetasol (TEMOVATE) 0.05 % external solution Apply 1 application topically at bedtime.    . Coenzyme Q10 (COQ10 PO) Take by mouth daily.      . diazepam (VALIUM) 5 MG tablet TAKE 1/2 TABLET BY MOUTH AS DIRECTED AS NEEDED 30 tablet 0  . diclofenac (VOLTAREN) 50 MG EC tablet Take 50 mg by mouth daily.     Marland Kitchen estradiol (ESTRACE) 0.5 MG tablet Take 0.5 tablets (0.25 mg total) by mouth daily. 45 tablet 3  . hydrochlorothiazide (HYDRODIURIL) 25 MG tablet TAKE 1 TABLET BY MOUTH EVERY DAY 90 tablet 0  . ketoconazole (NIZORAL) 2 % shampoo Apply 1 application topically every other day.    . medroxyPROGESTERone (PROVERA) 2.5 MG tablet Take 0.5 tablets (1.25 mg total) by mouth daily. 45 tablet 3  . Multiple Vitamin (MULTIVITAMIN) tablet Take 1 tablet by mouth daily.      Vladimir Faster Glycol-Propyl Glycol (SYSTANE) 0.4-0.3 % SOLN 1 drop.    . sertraline (ZOLOFT) 25 MG tablet TAKE  1 TABLET BY MOUTH EVERY DAY 90 tablet 1  . EPINEPHrine 0.3 mg/0.3 mL IJ SOAJ injection Inject 0.3 mLs (0.3 mg total) into the muscle once. (Patient not taking: Reported on 06/11/2015) 2 Device 2   No current facility-administered medications for this visit.    Family History  Problem Relation Age of Onset  . Arthritis Mother   . Arthritis Father   . Diabetes Sister     ROS:  Pertinent items are noted in HPI.  Otherwise, a comprehensive ROS was negative.  Exam:   BP 122/60 mmHg  Pulse 84  Resp 18  Ht 5\' 4"  (1.626 m)  Wt 124 lb (56.246 kg)  BMI 21.27 kg/m2  LMP 01/13/2013   Height: 5\' 4"  (162.6 cm)  Ht Readings from Last 3 Encounters:  10/28/15 5\' 4"  (1.626 m)  06/11/15 5\' 5"  (1.651 m)  12/11/14 5\' 5"  (1.651 m)    General appearance: alert, cooperative and appears stated age Head: Normocephalic, without obvious abnormality, atraumatic Neck: no adenopathy, supple, symmetrical, trachea midline and thyroid normal to inspection and palpation Lungs: clear to auscultation  bilaterally Breasts: normal appearance, no masses or tenderness Heart: regular rate and rhythm Abdomen: soft, non-tender; bowel sounds normal; no masses,  no organomegaly Extremities: extremities normal, atraumatic, no cyanosis or edema Skin: Skin color, texture, turgor normal. No rashes or lesions Lymph nodes: Cervical, supraclavicular, and axillary nodes normal. No abnormal inguinal nodes palpated Neurologic: Grossly normal   Pelvic: External genitalia:  no lesions              Urethra:  normal appearing urethra with no masses, tenderness or lesions              Bartholins and Skenes: normal                 Vagina: normal appearing vagina with normal color and discharge, no lesions              Cervix: no lesions              Pap taken: No. Bimanual Exam:  Uterus:  normal size, contour, position, consistency, mobility, non-tender              Adnexa: normal adnexa and no mass, fullness, tenderness               Rectovaginal: Confirms               Anus:  normal sphincter tone, no lesions  Chaperone was present for exam.  A:  Well Woman with normal exam  PMP, on HRT  Hypertension Parkinson's.  Seeing neurologist at [facility].   Depression from caring for spouse, stable.    P: Mammogram yearly.  pap smear today. Patient desires yearly Pap smears due to possible exposure from husband.  Aware of guideline changes Going to try again Prem/pro 0.3/1.5mg  dosing due to pt's concern about seborrhea of scalp.  Absolutely desires to continue HRT.  Risks discussed including stroke, PE or DVT, MI, breast cancer.  (estrace and Provera prescriptions left on prescription list) Labs with PCP  return annually or prn

## 2015-10-29 LAB — IPS PAP SMEAR ONLY

## 2015-10-30 DIAGNOSIS — S29012A Strain of muscle and tendon of back wall of thorax, initial encounter: Secondary | ICD-10-CM | POA: Diagnosis not present

## 2015-10-30 DIAGNOSIS — M4316 Spondylolisthesis, lumbar region: Secondary | ICD-10-CM | POA: Diagnosis not present

## 2015-10-30 DIAGNOSIS — M47812 Spondylosis without myelopathy or radiculopathy, cervical region: Secondary | ICD-10-CM | POA: Diagnosis not present

## 2015-10-30 DIAGNOSIS — M9903 Segmental and somatic dysfunction of lumbar region: Secondary | ICD-10-CM | POA: Diagnosis not present

## 2015-10-30 DIAGNOSIS — M9902 Segmental and somatic dysfunction of thoracic region: Secondary | ICD-10-CM | POA: Diagnosis not present

## 2015-10-30 DIAGNOSIS — M9901 Segmental and somatic dysfunction of cervical region: Secondary | ICD-10-CM | POA: Diagnosis not present

## 2015-11-06 DIAGNOSIS — M9901 Segmental and somatic dysfunction of cervical region: Secondary | ICD-10-CM | POA: Diagnosis not present

## 2015-11-06 DIAGNOSIS — M9903 Segmental and somatic dysfunction of lumbar region: Secondary | ICD-10-CM | POA: Diagnosis not present

## 2015-11-06 DIAGNOSIS — M47812 Spondylosis without myelopathy or radiculopathy, cervical region: Secondary | ICD-10-CM | POA: Diagnosis not present

## 2015-11-06 DIAGNOSIS — S29012A Strain of muscle and tendon of back wall of thorax, initial encounter: Secondary | ICD-10-CM | POA: Diagnosis not present

## 2015-11-06 DIAGNOSIS — M9902 Segmental and somatic dysfunction of thoracic region: Secondary | ICD-10-CM | POA: Diagnosis not present

## 2015-11-06 DIAGNOSIS — M4316 Spondylolisthesis, lumbar region: Secondary | ICD-10-CM | POA: Diagnosis not present

## 2015-11-10 ENCOUNTER — Other Ambulatory Visit: Payer: Self-pay | Admitting: Family Medicine

## 2015-11-11 ENCOUNTER — Telehealth: Payer: Self-pay | Admitting: Family Medicine

## 2015-11-11 NOTE — Telephone Encounter (Signed)
Medication filled to pharmacy as requested.   

## 2015-11-11 NOTE — Telephone Encounter (Signed)
Last OV 06/11/15 Valium last filled 10/13/15 #30 with 0

## 2015-11-11 NOTE — Telephone Encounter (Signed)
Pt never picked up rx for Diazepam (Valium) 5 mg from June 12, 2015 -(Shredded)

## 2015-11-26 DIAGNOSIS — Z961 Presence of intraocular lens: Secondary | ICD-10-CM | POA: Diagnosis not present

## 2015-11-26 DIAGNOSIS — H52203 Unspecified astigmatism, bilateral: Secondary | ICD-10-CM | POA: Diagnosis not present

## 2015-11-26 DIAGNOSIS — H5203 Hypermetropia, bilateral: Secondary | ICD-10-CM | POA: Diagnosis not present

## 2015-11-26 DIAGNOSIS — H02839 Dermatochalasis of unspecified eye, unspecified eyelid: Secondary | ICD-10-CM | POA: Diagnosis not present

## 2015-11-26 DIAGNOSIS — H11823 Conjunctivochalasis, bilateral: Secondary | ICD-10-CM | POA: Diagnosis not present

## 2015-11-26 DIAGNOSIS — H35371 Puckering of macula, right eye: Secondary | ICD-10-CM | POA: Diagnosis not present

## 2015-12-01 ENCOUNTER — Ambulatory Visit (INDEPENDENT_AMBULATORY_CARE_PROVIDER_SITE_OTHER): Payer: Medicare Other | Admitting: Family

## 2015-12-01 ENCOUNTER — Encounter: Payer: Self-pay | Admitting: Family

## 2015-12-01 ENCOUNTER — Telehealth: Payer: Self-pay | Admitting: Family

## 2015-12-01 ENCOUNTER — Ambulatory Visit (HOSPITAL_BASED_OUTPATIENT_CLINIC_OR_DEPARTMENT_OTHER)
Admission: RE | Admit: 2015-12-01 | Discharge: 2015-12-01 | Disposition: A | Discharge status: Home / Self Care | Payer: Medicare Other | Source: Ambulatory Visit | Attending: Family | Admitting: Family

## 2015-12-01 VITALS — BP 154/76 | HR 67 | Temp 98.2°F | Resp 16 | Ht 64.0 in | Wt 126.6 lb

## 2015-12-01 DIAGNOSIS — M7121 Synovial cyst of popliteal space [Baker], right knee: Secondary | ICD-10-CM | POA: Diagnosis not present

## 2015-12-01 DIAGNOSIS — M7989 Other specified soft tissue disorders: Secondary | ICD-10-CM

## 2015-12-01 DIAGNOSIS — M79604 Pain in right leg: Secondary | ICD-10-CM | POA: Diagnosis not present

## 2015-12-01 DIAGNOSIS — M5416 Radiculopathy, lumbar region: Secondary | ICD-10-CM

## 2015-12-01 DIAGNOSIS — M5417 Radiculopathy, lumbosacral region: Secondary | ICD-10-CM

## 2015-12-01 NOTE — Progress Notes (Signed)
Pre visit review using our clinic review tool, if applicable. No additional management support is needed unless otherwise documented below in the visit note. 

## 2015-12-01 NOTE — Patient Instructions (Signed)
Let us know if you decide to pursue additional imaging such as MRI of your lumbar spine.   Complete ultrasound on the first floor in the imaging department.

## 2015-12-01 NOTE — Progress Notes (Signed)
Subjective:    Patient ID: Stephanie [name], female    DOB: [birth year], 82 y.o.   MRN: [id number]  HPI  [name] is an 82 yr old female who presents today with chief complaint of swelling behind the right knee.  She also reports some intermittent .  Patient reports some shooting sensations going down the right leg.  Pain is "between burning and numbness." She sometimes has some right sciatic pain- relieved by laying down.  She did walk 1.5 miles today without pain. She reports that in the past she has hx of ESI's performed by Dr. Nelva Bush. She reports that she has never had an MRI of her spine.  Has had some remote spinal x-rays. Denies known injury. She is on daily diclofenac "for arthritis."   Pt has seen Dr. Maureen Ralphs for Hip pain in the past. Most recently she has been working with a Restaurant manager, fast food and has been doing acupunture which she has found helpful. Reports that her chiro told her to see PCP to "rule out a problem with my circulation."  She is the primary caregiver for her husband who has advanced PD.  Review of Systems See HPI Past Medical History  Diagnosis Date  . Hypertension   . Situational stress   . Parkinson disease ([place]) 2012  . Vertigo     took antivert for a couple days  . OA (osteoarthritis)     [facility]  . Back pain     due to osteoarthritis  . Dry scalp     Social History   Social History  . Marital Status: Married    Spouse Name: N/A  . Number of Children: N/A  . Years of Education: N/A   Occupational History  . Not on file.   Social History Main Topics  . Smoking status: Former Smoker    Quit date: 05/04/1966  . Smokeless tobacco: Never Used  . Alcohol Use: 2.0 oz/week    4 Standard drinks or equivalent per week  . Drug Use: No  . Sexual Activity: No   Other Topics Concern  . Not on file   Social History Narrative    Past Surgical History  Procedure Laterality Date  . Tonsillectomy and adenoidectomy    .  Dilation and curettage of uterus    . Cystectomy      wrist  . Laparoscopy      secondary infertility   . Hand surgery    . Cataract extraction    . Hysteroscopy      resection of polyp  . Biopsy thyroid    . Wrist cyst      right    Family History  Problem Relation Age of Onset  . Arthritis Mother   . Arthritis Father   . Diabetes Sister     Allergies  Allergen Reactions  . Bee Venom Swelling    Yellow jackets, white and black face hornets  . Codeine Nausea And Vomiting  . Penicillins Itching and Swelling  . Lodine [Etodolac] Rash    Current Outpatient Prescriptions on File Prior to Visit  Medication Sig Dispense Refill  . Calcium-Vitamin D-Vitamin K (CALCIUM + D + K PO) Take 600 mg by mouth daily.      . carbidopa-levodopa (SINEMET IR) 25-100 MG per tablet 3 (three) times daily.    . Cholecalciferol (VITAMIN D3) 2000 UNITS TABS Take by mouth daily.      . clobetasol (TEMOVATE) 0.05 % external  solution Apply 1 application topically at bedtime.    . Coenzyme Q10 (COQ10 PO) Take by mouth daily.      . diazepam (VALIUM) 5 MG tablet TAKE 1/2 TABLET BY MOUTH AS DIRECTED OR AS NEEDED 30 tablet 0  . diclofenac (VOLTAREN) 50 MG EC tablet Take 50 mg by mouth daily.     Marland Kitchen EPINEPHrine 0.3 mg/0.3 mL IJ SOAJ injection Inject 0.3 mLs (0.3 mg total) into the muscle once. 2 Device 2  . estradiol (ESTRACE) 0.5 MG tablet Take 0.5 tablets (0.25 mg total) by mouth daily. 45 tablet 3  . hydrochlorothiazide (HYDRODIURIL) 25 MG tablet TAKE 1 TABLET BY MOUTH EVERY DAY 90 tablet 0  . ketoconazole (NIZORAL) 2 % shampoo Apply 1 application topically every other day.    . medroxyPROGESTERone (PROVERA) 2.5 MG tablet Take 0.5 tablets (1.25 mg total) by mouth daily. 45 tablet 3  . Multiple Vitamin (MULTIVITAMIN) tablet Take 1 tablet by mouth daily.      Vladimir Faster Glycol-Propyl Glycol (SYSTANE) 0.4-0.3 % SOLN 1 drop.    . sertraline (ZOLOFT) 25 MG tablet TAKE 1 TABLET BY MOUTH EVERY DAY 90 tablet 1     No current facility-administered medications on file prior to visit.    BP 154/76 mmHg  Pulse 67  Temp(Src) 98.2 F (36.8 C) (Oral)  Resp 16  Ht 5\' 4"  (1.626 m)  Wt 126 lb 9.6 oz (57.425 kg)  BMI 21.72 kg/m2  SpO2 100%  LMP 01/13/2013       Objective:   Physical Exam  Constitutional: She is oriented to person, place, and time. She appears well-developed and well-nourished.  HENT:  Head: Normocephalic and atraumatic.  Cardiovascular: Normal rate, regular rhythm and normal heart sounds.   No murmur heard. Pulses:      Dorsalis pedis pulses are 2+ on the right side, and 2+ on the left side.       Posterior tibial pulses are 2+ on the right side, and 2+ on the left side.  Pulmonary/Chest: Effort normal and breath sounds normal. No respiratory distress. She has no wheezes.  Musculoskeletal: She exhibits no edema.  Mild swelling noted of the right knee.   Neurological: She is alert and oriented to person, place, and time.  Reflex Scores:      Patellar reflexes are 2+ on the right side and 2+ on the left side. Bilateral LE strength is 5/5  Skin: Skin is warm and dry.  Psychiatric: She has a normal mood and affect. Her behavior is normal. Judgment and thought content normal.          Assessment & Plan:  Posterior knee swelling- suspect baker's cyst- refer for LE [place].   Low back pain with radiculopathy- suspect that her leg numbness/pain is related to DDD of her lumbar spine. We discussed further imaging of her spine (MRI, x rays). She declines at this time.  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 I have asked pt to call me if her symptoms worsen or if she changes her mind on pursuing additional imaging.

## 2015-12-01 NOTE — Telephone Encounter (Signed)
Ultrasound is negative for clot.  Note is made of baker's cyst.  I will request an ortho appointment with Dr. Maureen Ralphs to review her right knee pain and baker's cyst.

## 2015-12-02 NOTE — Telephone Encounter (Signed)
Notified pt. She voices understanding and is agreeable to ortho appt.

## 2015-12-02 NOTE — Telephone Encounter (Signed)
Attempted to reach pt. Left message to have pt return my call and was told that pt would be back home after 3pm.

## 2015-12-02 NOTE — Telephone Encounter (Signed)
Pt called back. She is home now waiting for your call.

## 2015-12-08 ENCOUNTER — Other Ambulatory Visit: Payer: Self-pay | Admitting: Family Medicine

## 2015-12-08 NOTE — Telephone Encounter (Signed)
Medication filled to pharmacy as requested.   

## 2015-12-09 ENCOUNTER — Telehealth: Payer: Self-pay | Admitting: Family Medicine

## 2015-12-09 DIAGNOSIS — M5416 Radiculopathy, lumbar region: Secondary | ICD-10-CM

## 2015-12-09 NOTE — Telephone Encounter (Signed)
Caller name: Self  Can be reached: [phone]  Reason for call: Patient states that she received a referral from [facility] to Orthopedic Surgery and she can not get in to see them until April, but could not schedule an appt because their schedule is not out that far. Wants to know if she can get a MRI because of her back problems. States that [place] suggested it.

## 2015-12-09 NOTE — Telephone Encounter (Signed)
I have placed order for MRI.

## 2015-12-09 NOTE — Telephone Encounter (Signed)
Notified pt. 

## 2015-12-13 ENCOUNTER — Ambulatory Visit (HOSPITAL_BASED_OUTPATIENT_CLINIC_OR_DEPARTMENT_OTHER)
Admission: RE | Admit: 2015-12-13 | Discharge: 2015-12-13 | Disposition: A | Discharge status: Home / Self Care | Payer: Medicare Other | Source: Ambulatory Visit | Attending: Family | Admitting: Family

## 2015-12-13 DIAGNOSIS — M5136 Other intervertebral disc degeneration, lumbar region: Secondary | ICD-10-CM | POA: Diagnosis not present

## 2015-12-13 DIAGNOSIS — M5126 Other intervertebral disc displacement, lumbar region: Secondary | ICD-10-CM | POA: Insufficient documentation

## 2015-12-13 DIAGNOSIS — M5417 Radiculopathy, lumbosacral region: Secondary | ICD-10-CM | POA: Insufficient documentation

## 2015-12-13 DIAGNOSIS — M4316 Spondylolisthesis, lumbar region: Secondary | ICD-10-CM | POA: Diagnosis not present

## 2015-12-13 DIAGNOSIS — M4806 Spinal stenosis, lumbar region: Secondary | ICD-10-CM | POA: Insufficient documentation

## 2015-12-13 DIAGNOSIS — M4686 Other specified inflammatory spondylopathies, lumbar region: Secondary | ICD-10-CM | POA: Diagnosis not present

## 2015-12-13 DIAGNOSIS — M545 Low back pain: Secondary | ICD-10-CM | POA: Diagnosis present

## 2015-12-13 DIAGNOSIS — M5416 Radiculopathy, lumbar region: Secondary | ICD-10-CM

## 2015-12-16 ENCOUNTER — Telehealth: Payer: Self-pay | Admitting: Family

## 2015-12-16 DIAGNOSIS — M545 Low back pain, unspecified: Secondary | ICD-10-CM

## 2015-12-16 NOTE — Telephone Encounter (Signed)
Please let pt know that MRI shows bulging disc and some arthritis in her spine.  I would like for her to meet with neurosurgery for further recommendations if she is agreeable.

## 2015-12-16 NOTE — Telephone Encounter (Signed)
Notified pt and she is agreeable to proceed with referral. Pt also states she has appt with Ortho (Dr Nelva Bush) on 12/29/15 and wants to know if she should keep that appt?

## 2015-12-17 ENCOUNTER — Encounter: Payer: BLUE CROSS/BLUE SHIELD | Admitting: Family Medicine

## 2015-12-17 DIAGNOSIS — G20 Parkinson's disease: Secondary | ICD-10-CM | POA: Diagnosis not present

## 2015-12-30 DIAGNOSIS — M4316 Spondylolisthesis, lumbar region: Secondary | ICD-10-CM | POA: Diagnosis not present

## 2015-12-30 DIAGNOSIS — M47816 Spondylosis without myelopathy or radiculopathy, lumbar region: Secondary | ICD-10-CM | POA: Diagnosis not present

## 2015-12-30 DIAGNOSIS — M5416 Radiculopathy, lumbar region: Secondary | ICD-10-CM | POA: Diagnosis not present

## 2016-01-12 ENCOUNTER — Encounter: Payer: Self-pay | Admitting: Family Medicine

## 2016-01-12 ENCOUNTER — Ambulatory Visit (INDEPENDENT_AMBULATORY_CARE_PROVIDER_SITE_OTHER): Payer: Medicare Other | Admitting: Family Medicine

## 2016-01-12 ENCOUNTER — Other Ambulatory Visit (HOSPITAL_COMMUNITY)
Admission: RE | Admit: 2016-01-12 | Discharge: 2016-01-12 | Disposition: A | Discharge status: Home / Self Care | Payer: Medicare Other | Source: Ambulatory Visit | Attending: Family Medicine | Admitting: Family Medicine

## 2016-01-12 VITALS — BP 128/76 | HR 91 | Temp 98.0°F | Ht 64.0 in | Wt 123.2 lb

## 2016-01-12 DIAGNOSIS — M545 Low back pain: Secondary | ICD-10-CM

## 2016-01-12 DIAGNOSIS — M62838 Other muscle spasm: Secondary | ICD-10-CM

## 2016-01-12 DIAGNOSIS — I10 Essential (primary) hypertension: Secondary | ICD-10-CM | POA: Diagnosis not present

## 2016-01-12 DIAGNOSIS — Z113 Encounter for screening for infections with a predominantly sexual mode of transmission: Secondary | ICD-10-CM | POA: Diagnosis not present

## 2016-01-12 DIAGNOSIS — G20 Parkinson's disease: Secondary | ICD-10-CM

## 2016-01-12 DIAGNOSIS — N760 Acute vaginitis: Secondary | ICD-10-CM | POA: Insufficient documentation

## 2016-01-12 DIAGNOSIS — M6248 Contracture of muscle, other site: Secondary | ICD-10-CM

## 2016-01-12 DIAGNOSIS — G20A1 Parkinson's disease without dyskinesia, without mention of fluctuations: Secondary | ICD-10-CM

## 2016-01-12 DIAGNOSIS — Z8619 Personal history of other infectious and parasitic diseases: Secondary | ICD-10-CM | POA: Insufficient documentation

## 2016-01-12 DIAGNOSIS — R251 Tremor, unspecified: Secondary | ICD-10-CM

## 2016-01-12 DIAGNOSIS — Z0000 Encounter for general adult medical examination without abnormal findings: Secondary | ICD-10-CM

## 2016-01-12 NOTE — Assessment & Plan Note (Signed)
Well controlled, no changes to meds. Encouraged heart healthy diet such as the DASH diet and exercise as tolerated.  °

## 2016-01-12 NOTE — Assessment & Plan Note (Signed)
right

## 2016-01-12 NOTE — Patient Instructions (Addendum)
Try Salon Pas or Aspercreme Lidocaine patches for the neck  Preventive Care for Adults, Female A healthy lifestyle and preventive care can promote health and wellness. Preventive health guidelines for women include the following key practices.  A routine yearly physical is a good way to check with your health care provider about your health and preventive screening. It is a chance to share any concerns and updates on your health and to receive a thorough exam.  Visit your dentist for a routine exam and preventive care every 6 months. Brush your teeth twice a day and floss once a day. Good oral hygiene prevents tooth decay and gum disease.  The frequency of eye exams is based on your age, health, family medical history, use of contact lenses, and other factors. Follow your health care provider's recommendations for frequency of eye exams.  Eat a healthy diet. Foods like vegetables, fruits, whole grains, low-fat dairy products, and lean protein foods contain the nutrients you need without too many calories. Decrease your intake of foods high in solid fats, added sugars, and salt. Eat the right amount of calories for you.Get information about a proper diet from your health care provider, if necessary.  Regular physical exercise is one of the most important things you can do for your health. Most adults should get at least 150 minutes of moderate-intensity exercise (any activity that increases your heart rate and causes you to sweat) each week. In addition, most adults need muscle-strengthening exercises on 2 or more days a week.  Maintain a healthy weight. The body mass index (BMI) is a screening tool to identify possible weight problems. It provides an estimate of body fat based on height and weight. Your health care provider can find your BMI and can help you achieve or maintain a healthy weight.For adults 20 years and older:  A BMI below 18.5 is considered underweight.  A BMI of 18.5 to 24.9 is  normal.  A BMI of 25 to 29.9 is considered overweight.  A BMI of 30 and above is considered obese.  Maintain normal blood lipids and cholesterol levels by exercising and minimizing your intake of saturated fat. Eat a balanced diet with plenty of fruit and vegetables. Blood tests for lipids and cholesterol should begin at age 17 and be repeated every 5 years. If your lipid or cholesterol levels are high, you are over 50, or you are at high risk for heart disease, you may need your cholesterol levels checked more frequently.Ongoing high lipid and cholesterol levels should be treated with medicines if diet and exercise are not working.  If you smoke, find out from your health care provider how to quit. If you do not use tobacco, do not start.  Lung cancer screening is recommended for adults aged 21-80 years who are at high risk for developing lung cancer because of a history of smoking. A yearly low-dose CT scan of the lungs is recommended for people who have at least a 30-pack-year history of smoking and are a current smoker or have quit within the past 15 years. A pack year of smoking is smoking an average of 1 pack of cigarettes a day for 1 year (for example: 1 pack a day for 30 years or 2 packs a day for 15 years). Yearly screening should continue until the smoker has stopped smoking for at least 15 years. Yearly screening should be stopped for people who develop a health problem that would prevent them from having lung cancer treatment.  If you are pregnant, do not drink alcohol. If you are breastfeeding, be very cautious about drinking alcohol. If you are not pregnant and choose to drink alcohol, do not have more than 1 drink per day. One drink is considered to be 12 ounces (355 mL) of beer, 5 ounces (148 mL) of wine, or 1.5 ounces (44 mL) of liquor.  Avoid use of street drugs. Do not share needles with anyone. Ask for help if you need support or instructions about stopping the use of  drugs.  High blood pressure causes heart disease and increases the risk of stroke. Your blood pressure should be checked at least every 1 to 2 years. Ongoing high blood pressure should be treated with medicines if weight loss and exercise do not work.  If you are 66-61 years old, ask your health care provider if you should take aspirin to prevent strokes.  Diabetes screening is done by taking a blood sample to check your blood glucose level after you have not eaten for a certain period of time (fasting). If you are not overweight and you do not have risk factors for diabetes, you should be screened once every 3 years starting at age 32. If you are overweight or obese and you are 35-60 years of age, you should be screened for diabetes every year as part of your cardiovascular risk assessment.  Breast cancer screening is essential preventive care for women. You should practice "breast self-awareness." This means understanding the normal appearance and feel of your breasts and may include breast self-examination. Any changes detected, no matter how small, should be reported to a health care provider. Women in their 63s and 30s should have a clinical breast exam (CBE) by a health care provider as part of a regular health exam every 1 to 3 years. After age 33, women should have a CBE every year. Starting at age 80, women should consider having a mammogram (breast X-ray test) every year. Women who have a family history of breast cancer should talk to their health care provider about genetic screening. Women at a high risk of breast cancer should talk to their health care providers about having an MRI and a mammogram every year.  Breast cancer gene (BRCA)-related cancer risk assessment is recommended for women who have family members with BRCA-related cancers. BRCA-related cancers include breast, ovarian, tubal, and peritoneal cancers. Having family members with these cancers may be associated with an increased  risk for harmful changes (mutations) in the breast cancer genes BRCA1 and BRCA2. Results of the assessment will determine the need for genetic counseling and BRCA1 and BRCA2 testing.  Your health care provider may recommend that you be screened regularly for cancer of the pelvic organs (ovaries, uterus, and vagina). This screening involves a pelvic examination, including checking for microscopic changes to the surface of your cervix (Pap test). You may be encouraged to have this screening done every 3 years, beginning at age 74.  For women ages 87-65, health care providers may recommend pelvic exams and Pap testing every 3 years, or they may recommend the Pap and pelvic exam, combined with testing for human papilloma virus (HPV), every 5 years. Some types of HPV increase your risk of cervical cancer. Testing for HPV may also be done on women of any age with unclear Pap test results.  Other health care providers may not recommend any screening for nonpregnant women who are considered low risk for pelvic cancer and who do not have symptoms. Ask your  health care provider if a screening pelvic exam is right for you.  If you have had past treatment for cervical cancer or a condition that could lead to cancer, you need Pap tests and screening for cancer for at least 20 years after your treatment. If Pap tests have been discontinued, your risk factors (such as having a new sexual partner) need to be reassessed to determine if screening should resume. Some women have medical problems that increase the chance of getting cervical cancer. In these cases, your health care provider may recommend more frequent screening and Pap tests.  Colorectal cancer can be detected and often prevented. Most routine colorectal cancer screening begins at the age of 83 years and continues through age 27 years. However, your health care provider may recommend screening at an earlier age if you have risk factors for colon cancer. On a  yearly basis, your health care provider may provide home test kits to check for hidden blood in the stool. Use of a small camera at the end of a tube, to directly examine the colon (sigmoidoscopy or colonoscopy), can detect the earliest forms of colorectal cancer. Talk to your health care provider about this at age 33, when routine screening begins. Direct exam of the colon should be repeated every 5-10 years through age 91 years, unless early forms of precancerous polyps or small growths are found.  People who are at an increased risk for hepatitis B should be screened for this virus. You are considered at high risk for hepatitis B if:  You were born in a country where hepatitis B occurs often. Talk with your health care provider about which countries are considered high risk.  Your parents were born in a high-risk country and you have not received a shot to protect against hepatitis B (hepatitis B vaccine).  You have HIV or AIDS.  You use needles to inject street drugs.  You live with, or have sex with, someone who has hepatitis B.  You get hemodialysis treatment.  You take certain medicines for conditions like cancer, organ transplantation, and autoimmune conditions.  Hepatitis C blood testing is recommended for all people born from [birth year] through 1965 and any individual with known risks for hepatitis C.  Practice safe sex. Use condoms and avoid high-risk sexual practices to reduce the spread of sexually transmitted infections (STIs). STIs include gonorrhea, chlamydia, syphilis, trichomonas, herpes, HPV, and human immunodeficiency virus (HIV). Herpes, HIV, and HPV are viral illnesses that have no cure. They can result in disability, cancer, and death.  You should be screened for sexually transmitted illnesses (STIs) including gonorrhea and chlamydia if:  You are sexually active and are younger than 24 years.  You are older than 24 years and your health care provider tells you that you are  at risk for this type of infection.  Your sexual activity has changed since you were last screened and you are at an increased risk for chlamydia or gonorrhea. Ask your health care provider if you are at risk.  If you are at risk of being infected with HIV, it is recommended that you take a prescription medicine daily to prevent HIV infection. This is called preexposure prophylaxis (PrEP). You are considered at risk if:  You are sexually active and do not regularly use condoms or know the HIV status of your partner(s).  You take drugs by injection.  You are sexually active with a partner who has HIV.  Talk with your health care provider about whether  you are at high risk of being infected with HIV. If you choose to begin PrEP, you should first be tested for HIV. You should then be tested every 3 months for as long as you are taking PrEP.  Osteoporosis is a disease in which the bones lose minerals and strength with aging. This can result in serious bone fractures or breaks. The risk of osteoporosis can be identified using a bone density scan. Women ages 65 years and over and women at risk for fractures or osteoporosis should discuss screening with their health care providers. Ask your health care provider whether you should take a calcium supplement or vitamin D to reduce the rate of osteoporosis.  Menopause can be associated with physical symptoms and risks. Hormone replacement therapy is available to decrease symptoms and risks. You should talk to your health care provider about whether hormone replacement therapy is right for you.  Use sunscreen. Apply sunscreen liberally and repeatedly throughout the day. You should seek shade when your shadow is shorter than you. Protect yourself by wearing long sleeves, pants, a wide-brimmed hat, and sunglasses year round, whenever you are outdoors.  Once a month, do a whole body skin exam, using a mirror to look at the skin on your back. Tell your health  care provider of new moles, moles that have irregular borders, moles that are larger than a pencil eraser, or moles that have changed in shape or color.  Stay current with required vaccines (immunizations).  Influenza vaccine. All adults should be immunized every year.  Tetanus, diphtheria, and acellular pertussis (Td, Tdap) vaccine. Pregnant women should receive 1 dose of Tdap vaccine during each pregnancy. The dose should be obtained regardless of the length of time since the last dose. Immunization is preferred during the 27th-36th week of gestation. An adult who has not previously received Tdap or who does not know her vaccine status should receive 1 dose of Tdap. This initial dose should be followed by tetanus and diphtheria toxoids (Td) booster doses every 10 years. Adults with an unknown or incomplete history of completing a 3-dose immunization series with Td-containing vaccines should begin or complete a primary immunization series including a Tdap dose. Adults should receive a Td booster every 10 years.  Varicella vaccine. An adult without evidence of immunity to varicella should receive 2 doses or a second dose if she has previously received 1 dose. Pregnant females who do not have evidence of immunity should receive the first dose after pregnancy. This first dose should be obtained before leaving the health care facility. The second dose should be obtained 4-8 weeks after the first dose.  Human papillomavirus (HPV) vaccine. Females aged 13-26 years who have not received the vaccine previously should obtain the 3-dose series. The vaccine is not recommended for use in pregnant females. However, pregnancy testing is not needed before receiving a dose. If a female is found to be pregnant after receiving a dose, no treatment is needed. In that case, the remaining doses should be delayed until after the pregnancy. Immunization is recommended for any person with an immunocompromised condition through  the age of 26 years if she did not get any or all doses earlier. During the 3-dose series, the second dose should be obtained 4-8 weeks after the first dose. The third dose should be obtained 24 weeks after the first dose and 16 weeks after the second dose.  Zoster vaccine. One dose is recommended for adults aged 60 years or older unless certain conditions   are present.  Measles, mumps, and rubella (MMR) vaccine. Adults born before [birth year] generally are considered immune to measles and mumps. Adults born in [birth year] or later should have 1 or more doses of MMR vaccine unless there is a contraindication to the vaccine or there is laboratory evidence of immunity to each of the three diseases. A routine second dose of MMR vaccine should be obtained at least 28 days after the first dose for students attending postsecondary schools, health care workers, or international travelers. People who received inactivated measles vaccine or an unknown type of measles vaccine during 1963-1967 should receive 2 doses of MMR vaccine. People who received inactivated mumps vaccine or an unknown type of mumps vaccine before 1979 and are at high risk for mumps infection should consider immunization with 2 doses of MMR vaccine. For females of childbearing age, rubella immunity should be determined. If there is no evidence of immunity, females who are not pregnant should be vaccinated. If there is no evidence of immunity, females who are pregnant should delay immunization until after pregnancy. Unvaccinated health care workers born before [birth year] who lack laboratory evidence of measles, mumps, or rubella immunity or laboratory confirmation of disease should consider measles and mumps immunization with 2 doses of MMR vaccine or rubella immunization with 1 dose of MMR vaccine.  Pneumococcal 13-valent conjugate (PCV13) vaccine. When indicated, a person who is uncertain of his immunization history and has no record of immunization should receive the  PCV13 vaccine. All adults 46 years of age and older should receive this vaccine. An adult aged 26 years or older who has certain medical conditions and has not been previously immunized should receive 1 dose of PCV13 vaccine. This PCV13 should be followed with a dose of pneumococcal polysaccharide (PPSV23) vaccine. Adults who are at high risk for pneumococcal disease should obtain the PPSV23 vaccine at least 8 weeks after the dose of PCV13 vaccine. Adults older than 80 years of age who have normal immune system function should obtain the PPSV23 vaccine dose at least 1 year after the dose of PCV13 vaccine.  Pneumococcal polysaccharide (PPSV23) vaccine. When PCV13 is also indicated, PCV13 should be obtained first. All adults aged 39 years and older should be immunized. An adult younger than age 63 years who has certain medical conditions should be immunized. Any person who resides in a nursing home or long-term care facility should be immunized. An adult smoker should be immunized. People with an immunocompromised condition and certain other conditions should receive both PCV13 and PPSV23 vaccines. People with human immunodeficiency virus (HIV) infection should be immunized as soon as possible after diagnosis. Immunization during chemotherapy or radiation therapy should be avoided. Routine use of PPSV23 vaccine is not recommended for American Indians, [place] Natives, or people younger than 65 years unless there are medical conditions that require PPSV23 vaccine. When indicated, people who have unknown immunization and have no record of immunization should receive PPSV23 vaccine. One-time revaccination 5 years after the first dose of PPSV23 is recommended for people aged 19-64 years who have chronic kidney failure, nephrotic syndrome, asplenia, or immunocompromised conditions. People who received 1-2 doses of PPSV23 before age 14 years should receive another dose of PPSV23 vaccine at age 30 years or later if at least  5 years have passed since the previous dose. Doses of PPSV23 are not needed for people immunized with PPSV23 at or after age 63 years.  Meningococcal vaccine. Adults with asplenia or persistent complement component deficiencies should receive 2 doses  of quadrivalent meningococcal conjugate (MenACWY-D) vaccine. The doses should be obtained at least 2 months apart. Microbiologists working with certain meningococcal bacteria, military recruits, people at risk during an outbreak, and people who travel to or live in countries with a high rate of meningitis should be immunized. A first-year college student up through age 21 years who is living in a residence hall should receive a dose if she did not receive a dose on or after her 16th birthday. Adults who have certain high-risk conditions should receive one or more doses of vaccine.  Hepatitis A vaccine. Adults who wish to be protected from this disease, have certain high-risk conditions, work with hepatitis A-infected animals, work in hepatitis A research labs, or travel to or work in countries with a high rate of hepatitis A should be immunized. Adults who were previously unvaccinated and who anticipate close contact with an international adoptee during the first 60 days after arrival in the [place] from a country with a high rate of hepatitis A should be immunized.  Hepatitis B vaccine. Adults who wish to be protected from this disease, have certain high-risk conditions, may be exposed to blood or other infectious body fluids, are household contacts or sex partners of hepatitis B positive people, are clients or workers in certain care facilities, or travel to or work in countries with a high rate of hepatitis B should be immunized.  Haemophilus influenzae type b (Hib) vaccine. A previously unvaccinated person with asplenia or sickle cell disease or having a scheduled splenectomy should receive 1 dose of Hib vaccine. Regardless of previous immunization, a  recipient of a hematopoietic stem cell transplant should receive a 3-dose series 6-12 months after her successful transplant. Hib vaccine is not recommended for adults with HIV infection. Preventive Services / Frequency Ages 19 to 39 years  Blood pressure check.** / Every 3-5 years.  Lipid and cholesterol check.** / Every 5 years beginning at age 20.  Clinical breast exam.** / Every 3 years for women in their 20s and 30s.  BRCA-related cancer risk assessment.** / For women who have family members with a BRCA-related cancer (breast, ovarian, tubal, or peritoneal cancers).  Pap test.** / Every 2 years from ages 21 through 29. Every 3 years starting at age 30 through age 65 or 70 with a history of 3 consecutive normal Pap tests.  HPV screening.** / Every 3 years from ages 30 through ages 65 to 70 with a history of 3 consecutive normal Pap tests.  Hepatitis C blood test.** / For any individual with known risks for hepatitis C.  Skin self-exam. / Monthly.  Influenza vaccine. / Every year.  Tetanus, diphtheria, and acellular pertussis (Tdap, Td) vaccine.** / Consult your health care provider. Pregnant women should receive 1 dose of Tdap vaccine during each pregnancy. 1 dose of Td every 10 years.  Varicella vaccine.** / Consult your health care provider. Pregnant females who do not have evidence of immunity should receive the first dose after pregnancy.  HPV vaccine. / 3 doses over 6 months, if 26 and younger. The vaccine is not recommended for use in pregnant females. However, pregnancy testing is not needed before receiving a dose.  Measles, mumps, rubella (MMR) vaccine.** / You need at least 1 dose of MMR if you were born in 1957 or later. You may also need a 2nd dose. For females of childbearing age, rubella immunity should be determined. If there is no evidence of immunity, females who are not pregnant should be   vaccinated. If there is no evidence of immunity, females who are pregnant  should delay immunization until after pregnancy.  Pneumococcal 13-valent conjugate (PCV13) vaccine.** / Consult your health care provider.  Pneumococcal polysaccharide (PPSV23) vaccine.** / 1 to 2 doses if you smoke cigarettes or if you have certain conditions.  Meningococcal vaccine.** / 1 dose if you are age 19 to 21 years and a first-year college student living in a residence hall, or have one of several medical conditions, you need to get vaccinated against meningococcal disease. You may also need additional booster doses.  Hepatitis A vaccine.** / Consult your health care provider.  Hepatitis B vaccine.** / Consult your health care provider.  Haemophilus influenzae type b (Hib) vaccine.** / Consult your health care provider. Ages 40 to 64 years  Blood pressure check.** / Every year.  Lipid and cholesterol check.** / Every 5 years beginning at age 20 years.  Lung cancer screening. / Every year if you are aged 55-80 years and have a 30-pack-year history of smoking and currently smoke or have quit within the past 15 years. Yearly screening is stopped once you have quit smoking for at least 15 years or develop a health problem that would prevent you from having lung cancer treatment.  Clinical breast exam.** / Every year after age 40 years.  BRCA-related cancer risk assessment.** / For women who have family members with a BRCA-related cancer (breast, ovarian, tubal, or peritoneal cancers).  Mammogram.** / Every year beginning at age 40 years and continuing for as long as you are in good health. Consult with your health care provider.  Pap test.** / Every 3 years starting at age 30 years through age 65 or 70 years with a history of 3 consecutive normal Pap tests.  HPV screening.** / Every 3 years from ages 30 years through ages 65 to 70 years with a history of 3 consecutive normal Pap tests.  Fecal occult blood test (FOBT) of stool. / Every year beginning at age 50 years and  continuing until age 75 years. You may not need to do this test if you get a colonoscopy every 10 years.  Flexible sigmoidoscopy or colonoscopy.** / Every 5 years for a flexible sigmoidoscopy or every 10 years for a colonoscopy beginning at age 50 years and continuing until age 75 years.  Hepatitis C blood test.** / For all people born from 1945 through 1965 and any individual with known risks for hepatitis C.  Skin self-exam. / Monthly.  Influenza vaccine. / Every year.  Tetanus, diphtheria, and acellular pertussis (Tdap/Td) vaccine.** / Consult your health care provider. Pregnant women should receive 1 dose of Tdap vaccine during each pregnancy. 1 dose of Td every 10 years.  Varicella vaccine.** / Consult your health care provider. Pregnant females who do not have evidence of immunity should receive the first dose after pregnancy.  Zoster vaccine.** / 1 dose for adults aged 60 years or older.  Measles, mumps, rubella (MMR) vaccine.** / You need at least 1 dose of MMR if you were born in 1957 or later. You may also need a second dose. For females of childbearing age, rubella immunity should be determined. If there is no evidence of immunity, females who are not pregnant should be vaccinated. If there is no evidence of immunity, females who are pregnant should delay immunization until after pregnancy.  Pneumococcal 13-valent conjugate (PCV13) vaccine.** / Consult your health care provider.  Pneumococcal polysaccharide (PPSV23) vaccine.** / 1 to 2 doses if you smoke   cigarettes or if you have certain conditions.  Meningococcal vaccine.** / Consult your health care provider.  Hepatitis A vaccine.** / Consult your health care provider.  Hepatitis B vaccine.** / Consult your health care provider.  Haemophilus influenzae type b (Hib) vaccine.** / Consult your health care provider. Ages 65 years and over  Blood pressure check.** / Every year.  Lipid and cholesterol check.** / Every 5 years  beginning at age 20 years.  Lung cancer screening. / Every year if you are aged 55-80 years and have a 30-pack-year history of smoking and currently smoke or have quit within the past 15 years. Yearly screening is stopped once you have quit smoking for at least 15 years or develop a health problem that would prevent you from having lung cancer treatment.  Clinical breast exam.** / Every year after age 40 years.  BRCA-related cancer risk assessment.** / For women who have family members with a BRCA-related cancer (breast, ovarian, tubal, or peritoneal cancers).  Mammogram.** / Every year beginning at age 40 years and continuing for as long as you are in good health. Consult with your health care provider.  Pap test.** / Every 3 years starting at age 30 years through age 65 or 70 years with 3 consecutive normal Pap tests. Testing can be stopped between 65 and 70 years with 3 consecutive normal Pap tests and no abnormal Pap or HPV tests in the past 10 years.  HPV screening.** / Every 3 years from ages 30 years through ages 65 or 70 years with a history of 3 consecutive normal Pap tests. Testing can be stopped between 65 and 70 years with 3 consecutive normal Pap tests and no abnormal Pap or HPV tests in the past 10 years.  Fecal occult blood test (FOBT) of stool. / Every year beginning at age 50 years and continuing until age 75 years. You may not need to do this test if you get a colonoscopy every 10 years.  Flexible sigmoidoscopy or colonoscopy.** / Every 5 years for a flexible sigmoidoscopy or every 10 years for a colonoscopy beginning at age 50 years and continuing until age 75 years.  Hepatitis C blood test.** / For all people born from 1945 through 1965 and any individual with known risks for hepatitis C.  Osteoporosis screening.** / A one-time screening for women ages 65 years and over and women at risk for fractures or osteoporosis.  Skin self-exam. / Monthly.  Influenza vaccine. /  Every year.  Tetanus, diphtheria, and acellular pertussis (Tdap/Td) vaccine.** / 1 dose of Td every 10 years.  Varicella vaccine.** / Consult your health care provider.  Zoster vaccine.** / 1 dose for adults aged 60 years or older.  Pneumococcal 13-valent conjugate (PCV13) vaccine.** / Consult your health care provider.  Pneumococcal polysaccharide (PPSV23) vaccine.** / 1 dose for all adults aged 65 years and older.  Meningococcal vaccine.** / Consult your health care provider.  Hepatitis A vaccine.** / Consult your health care provider.  Hepatitis B vaccine.** / Consult your health care provider.  Haemophilus influenzae type b (Hib) vaccine.** / Consult your health care provider. ** Family history and personal history of risk and conditions may change your health care provider's recommendations.   This information is not intended to replace advice given to you by your health care provider. Make sure you discuss any questions you have with your health care provider.   Document Released: 12/28/2001 Document Revised: 11/22/2014 Document Reviewed: 03/29/2011 Elsevier Interactive Patient Education 2016 [employer] Inc.  

## 2016-01-12 NOTE — Progress Notes (Signed)
Pre visit review using our clinic review tool, if applicable. No additional management support is needed unless otherwise documented below in the visit note. 

## 2016-01-13 ENCOUNTER — Telehealth: Payer: Self-pay | Admitting: Family Medicine

## 2016-01-13 LAB — URINE CYTOLOGY ANCILLARY ONLY
CHLAMYDIA, DNA PROBE: NEGATIVE
Neisseria Gonorrhea: NEGATIVE
Trichomonas: NEGATIVE

## 2016-01-13 NOTE — Telephone Encounter (Signed)
Pt dropped off copy of her labs that her provider asked for.

## 2016-01-14 DIAGNOSIS — M5416 Radiculopathy, lumbar region: Secondary | ICD-10-CM | POA: Diagnosis not present

## 2016-01-15 LAB — URINE CYTOLOGY ANCILLARY ONLY
BACTERIAL VAGINITIS: NEGATIVE
CANDIDA VAGINITIS: NEGATIVE

## 2016-01-16 ENCOUNTER — Other Ambulatory Visit: Payer: Self-pay | Admitting: Family Medicine

## 2016-01-18 ENCOUNTER — Encounter: Payer: Self-pay | Admitting: Family Medicine

## 2016-01-18 DIAGNOSIS — N760 Acute vaginitis: Secondary | ICD-10-CM | POA: Insufficient documentation

## 2016-01-18 DIAGNOSIS — M545 Low back pain, unspecified: Secondary | ICD-10-CM | POA: Insufficient documentation

## 2016-01-18 DIAGNOSIS — Z0000 Encounter for general adult medical examination without abnormal findings: Secondary | ICD-10-CM | POA: Insufficient documentation

## 2016-01-18 NOTE — Progress Notes (Signed)
Patient ID: Stephanie [name], female   DOB: 05-01-1932, 82 y.o.   MRN: [id number]   Subjective:    Patient ID: Stephanie [name], female    DOB: 1932-09-15, 82 y.o.   MRN: [id number]  Chief Complaint  Patient presents with  . Annual Exam    HPI Patient is in today for annual exam. Is noting some vaginal discharge which is mildly pruritic, no abdominal pain or back pain. Is noting fatigue and myalgias. Has had ongoing back pain and follows with Dr Nelva Bush. Is following with neurology and her tremors are managed well. She continues to struggle with knee pain. No recent illness or acute concerns. Denies CP/palp/SOB/HA/congestion/fevers/GI or GU c/o. Taking meds as prescribed  Past Medical History  Diagnosis Date  . Hypertension   . Situational stress   . Parkinson disease ([place]) 2012  . Vertigo     took antivert for a couple days  . OA (osteoarthritis)     [facility]  . Back pain     due to osteoarthritis  . Dry scalp   . History of chicken pox   . H/O measles   . H/O mumps   . Vaginitis and vulvovaginitis 01/18/2016  . Preventative health care 01/18/2016  . Low back pain 01/18/2016    Past Surgical History  Procedure Laterality Date  . Tonsillectomy and adenoidectomy    . Dilation and curettage of uterus    . Cystectomy      wrist  . Laparoscopy      secondary infertility   . Hand surgery Right     MCP joint for arthritis, prosthesis placed and removed  . Cataract extraction    . Hysteroscopy      resection of polyp  . Biopsy thyroid      benign  . Wrist cyst      right    Family History  Problem Relation Age of Onset  . Arthritis Mother   . Hypertension Mother   . Deep vein thrombosis Mother     recurrent, secondary to Bleeding disorder  . Arthritis Father   . Stroke Father   . Diabetes Sister   . Heart disease Sister   . Obesity Sister   . Arthritis Sister   . Hypertension Sister   . Heart disease Maternal Grandmother   . Heart  disease Maternal Grandfather   . Peripheral vascular disease Paternal Grandmother     s/p leg amputation  . Stroke Paternal Grandfather     Social History   Social History  . Marital Status: Married    Spouse Name: N/A  . Number of Children: N/A  . Years of Education: N/A   Occupational History  . Not on file.   Social History Main Topics  . Smoking status: Former Smoker    Quit date: 05/04/1966  . Smokeless tobacco: Never Used  . Alcohol Use: 2.0 oz/week    4 Standard drinks or equivalent per week  . Drug Use: No  . Sexual Activity: No     Comment: lives with husband who has Parkinson's, Pharmacist, hospital and retail retired, no dietary restrictions.   Other Topics Concern  . Not on file   Social History Narrative    Outpatient Prescriptions Prior to Visit  Medication Sig Dispense Refill  . Calcium-Vitamin D-Vitamin K (CALCIUM + D + K PO) Take 600 mg by mouth daily.      . carbidopa-levodopa (SINEMET IR) 25-100 MG per tablet 3 (  three) times daily.    . Cholecalciferol (VITAMIN D3) 2000 UNITS TABS Take by mouth daily.      . clobetasol (TEMOVATE) 0.05 % external solution Apply 1 application topically at bedtime.    . Coenzyme Q10 (COQ10 PO) Take by mouth daily.      . diazepam (VALIUM) 5 MG tablet TAKE 1/2 TABLET BY MOUTH AS DIRECTED OR AS NEEDED 30 tablet 0  . EPINEPHrine 0.3 mg/0.3 mL IJ SOAJ injection Inject 0.3 mLs (0.3 mg total) into the muscle once. 2 Device 2  . estradiol (ESTRACE) 0.5 MG tablet Take 0.5 tablets (0.25 mg total) by mouth daily. 45 tablet 3  . hydrochlorothiazide (HYDRODIURIL) 25 MG tablet TAKE 1 TABLET BY MOUTH EVERY DAY 90 tablet 0  . ketoconazole (NIZORAL) 2 % shampoo Apply 1 application topically every other day.    . medroxyPROGESTERone (PROVERA) 2.5 MG tablet Take 0.5 tablets (1.25 mg total) by mouth daily. 45 tablet 3  . Multiple Vitamin (MULTIVITAMIN) tablet Take 1 tablet by mouth daily.      Vladimir Faster Glycol-Propyl Glycol (SYSTANE) 0.4-0.3 % SOLN 1  drop.    . sertraline (ZOLOFT) 25 MG tablet TAKE 1 TABLET BY MOUTH EVERY DAY 90 tablet 1  . diclofenac (VOLTAREN) 50 MG EC tablet Take 50 mg by mouth daily. Reported on 01/12/2016     No facility-administered medications prior to visit.    Allergies  Allergen Reactions  . Bee Venom Swelling    Yellow jackets, white and black face hornets  . Codeine Nausea And Vomiting  . Penicillins Itching and Swelling  . Lodine [Etodolac] Rash    Review of Systems  Constitutional: Positive for malaise/fatigue. Negative for fever and chills.  HENT: Negative for congestion and hearing loss.   Eyes: Negative for discharge.  Respiratory: Negative for cough, sputum production and shortness of breath.   Cardiovascular: Negative for chest pain, palpitations and leg swelling.  Gastrointestinal: Negative for heartburn, nausea, vomiting, abdominal pain, diarrhea, constipation and blood in stool.  Genitourinary: Negative for dysuria, urgency, frequency and hematuria.  Musculoskeletal: Positive for back pain. Negative for myalgias and falls.  Skin: Negative for rash.  Neurological: Positive for tremors. Negative for dizziness, sensory change, loss of consciousness, weakness and headaches.  Endo/Heme/Allergies: Negative for environmental allergies. Does not bruise/bleed easily.  Psychiatric/Behavioral: Negative for depression and suicidal ideas. The patient is not nervous/anxious and does not have insomnia.        Objective:    Physical Exam  Constitutional: She is oriented to person, place, and time. She appears well-developed and well-nourished. No distress.  HENT:  Head: Normocephalic and atraumatic.  Eyes: Conjunctivae are normal.  Neck: Neck supple. No thyromegaly present.  Cardiovascular: Normal rate, regular rhythm and normal heart sounds.   No murmur heard. Pulmonary/Chest: Effort normal and breath sounds normal. No respiratory distress.  Abdominal: Soft. Bowel sounds are normal. She exhibits  no distension and no mass. There is no tenderness.  Musculoskeletal: She exhibits no edema.  Lymphadenopathy:    She has no cervical adenopathy.  Neurological: She is alert and oriented to person, place, and time.  Skin: Skin is warm and dry.  Psychiatric: She has a normal mood and affect. Her behavior is normal.    BP 128/76 mmHg  Pulse 91  Temp(Src) 98 F (36.7 C) (Oral)  Ht 5\' 4"  (1.626 m)  Wt 123 lb 4 oz (55.906 kg)  BMI 21.15 kg/m2  SpO2 97%  LMP 01/13/2013 Wt Readings from Last 3 Encounters:  01/12/16 123 lb 4 oz (55.906 kg)  12/01/15 126 lb 9.6 oz (57.425 kg)  10/28/15 124 lb (56.246 kg)     Lab Results  Component Value Date   WBC 5.7 06/11/2015   HGB 14.7 06/11/2015   HCT 42.9 06/11/2015   PLT 186.0 06/11/2015   GLUCOSE 88 06/11/2015   CHOL 186 06/11/2015   TRIG 117.0 06/11/2015   HDL 46.00 06/11/2015   LDLDIRECT 120.0 12/11/2014   LDLCALC 117* 06/11/2015   ALT 9 06/11/2015   AST 30 06/11/2015   NA 137 06/11/2015   K 3.8 06/11/2015   CL 98 06/11/2015   CREATININE 0.75 06/11/2015   BUN 23 06/11/2015   CO2 33* 06/11/2015   TSH 1.06 12/11/2014   INR 1.11 07/24/2010    Lab Results  Component Value Date   TSH 1.06 12/11/2014   Lab Results  Component Value Date   WBC 5.7 06/11/2015   HGB 14.7 06/11/2015   HCT 42.9 06/11/2015   MCV 90.2 06/11/2015   PLT 186.0 06/11/2015   Lab Results  Component Value Date   NA 137 06/11/2015   K 3.8 06/11/2015   CO2 33* 06/11/2015   GLUCOSE 88 06/11/2015   BUN 23 06/11/2015   CREATININE 0.75 06/11/2015   BILITOT 0.7 06/11/2015   ALKPHOS 49 06/11/2015   AST 30 06/11/2015   ALT 9 06/11/2015   PROT 6.8 06/11/2015   ALBUMIN 4.6 06/11/2015   CALCIUM 9.9 06/11/2015   GFR 78.75 06/11/2015   Lab Results  Component Value Date   CHOL 186 06/11/2015   Lab Results  Component Value Date   HDL 46.00 06/11/2015   Lab Results  Component Value Date   LDLCALC 117* 06/11/2015   Lab Results  Component Value Date    TRIG 117.0 06/11/2015   Lab Results  Component Value Date   CHOLHDL 4 06/11/2015   No results found for: HGBA1C     Assessment & Plan:   Problem List Items Addressed This Visit    HTN (hypertension) - Primary    Well controlled, no changes to meds. Encouraged heart healthy diet such as the DASH diet and exercise as tolerated.       Low back pain    Follows with Dr Nelva Bush of pain management      Parkinson's disease [facility])    Following with neurology and doing well      Preventative health care    Patient encouraged to maintain heart healthy diet, regular exercise, adequate sleep. Consider daily probiotics. Take medications as prescribed.       Trapezius muscle spasm    right      Tremor   Vaginitis and vulvovaginitis    Testing is negative, encouraged to try daily cleansing with acetic acid with addition of probiotics. Follows with Dr Sabra Heck of GYN return there if symptoms do not resolve      Relevant Orders   Urine cytology ancillary only (Completed)      I am having Ms. Kilroy maintain her Calcium-Vitamin D-Vitamin K (CALCIUM + D + K PO), Vitamin D3, Coenzyme Q10 (COQ10 PO), diclofenac, multivitamin, carbidopa-levodopa, EPINEPHrine, estradiol, medroxyPROGESTERone, ketoconazole, clobetasol, sertraline, Polyethyl Glycol-Propyl Glycol, diazepam, and hydrochlorothiazide.  No orders of the defined types were placed in this encounter.     Penni Homans, MD

## 2016-01-18 NOTE — Assessment & Plan Note (Signed)
Following with neurology and doing well 

## 2016-01-18 NOTE — Assessment & Plan Note (Addendum)
Testing is negative, encouraged to try daily cleansing with acetic acid with addition of probiotics. Follows with Dr Sabra Heck of GYN return there if symptoms do not resolve

## 2016-01-18 NOTE — Assessment & Plan Note (Signed)
Patient encouraged to maintain heart healthy diet, regular exercise, adequate sleep. Consider daily probiotics. Take medications as prescribed 

## 2016-01-18 NOTE — Assessment & Plan Note (Signed)
Follows with Dr Nelva Bush of pain management

## 2016-01-19 NOTE — Telephone Encounter (Signed)
Faxed hardcopy for Valium to [facility] in [place].

## 2016-01-19 NOTE — Telephone Encounter (Signed)
Last refill was on 11/11/2015  #30 and Last OV 01/12/2016

## 2016-02-02 ENCOUNTER — Other Ambulatory Visit: Payer: Self-pay | Admitting: Obstetrics & Gynecology

## 2016-02-03 DIAGNOSIS — M542 Cervicalgia: Secondary | ICD-10-CM | POA: Diagnosis not present

## 2016-02-03 DIAGNOSIS — M5416 Radiculopathy, lumbar region: Secondary | ICD-10-CM | POA: Diagnosis not present

## 2016-02-03 DIAGNOSIS — M47812 Spondylosis without myelopathy or radiculopathy, cervical region: Secondary | ICD-10-CM | POA: Diagnosis not present

## 2016-02-03 DIAGNOSIS — M4316 Spondylolisthesis, lumbar region: Secondary | ICD-10-CM | POA: Diagnosis not present

## 2016-02-03 NOTE — Telephone Encounter (Signed)
Medication refill request: Estrase & Provera  Last AEX:  10-28-15 Next AEX: 02-18-17 Last MMG (if hormonal medication request): 03-06-15 WNL Refill authorized: please advise

## 2016-02-17 DIAGNOSIS — M542 Cervicalgia: Secondary | ICD-10-CM | POA: Diagnosis not present

## 2016-03-02 DIAGNOSIS — M542 Cervicalgia: Secondary | ICD-10-CM | POA: Diagnosis not present

## 2016-03-02 DIAGNOSIS — M47812 Spondylosis without myelopathy or radiculopathy, cervical region: Secondary | ICD-10-CM | POA: Diagnosis not present

## 2016-03-02 DIAGNOSIS — M5416 Radiculopathy, lumbar region: Secondary | ICD-10-CM | POA: Diagnosis not present

## 2016-03-02 DIAGNOSIS — M4316 Spondylolisthesis, lumbar region: Secondary | ICD-10-CM | POA: Diagnosis not present

## 2016-03-05 ENCOUNTER — Other Ambulatory Visit: Payer: Self-pay | Admitting: Family Medicine

## 2016-03-08 ENCOUNTER — Other Ambulatory Visit: Payer: Self-pay | Admitting: Family Medicine

## 2016-03-08 DIAGNOSIS — Z780 Asymptomatic menopausal state: Secondary | ICD-10-CM | POA: Diagnosis not present

## 2016-03-08 DIAGNOSIS — Z1231 Encounter for screening mammogram for malignant neoplasm of breast: Secondary | ICD-10-CM | POA: Diagnosis not present

## 2016-03-08 LAB — HM DEXA SCAN: HM DEXA SCAN: NORMAL

## 2016-03-08 MED ORDER — DIAZEPAM 5 MG PO TABS: ORAL_TABLET | ORAL | Status: DC

## 2016-03-08 NOTE — Telephone Encounter (Signed)
Last refill #30 with 0 refills on 01/19/2016 Last OV 01/12/2016

## 2016-03-09 ENCOUNTER — Telehealth: Payer: Self-pay | Admitting: Family Medicine

## 2016-03-09 ENCOUNTER — Other Ambulatory Visit: Payer: Self-pay | Admitting: Family Medicine

## 2016-03-09 MED ORDER — DIAZEPAM 5 MG PO TABS: ORAL_TABLET | ORAL | Status: DC

## 2016-03-09 NOTE — Telephone Encounter (Signed)
-----   Message from Oneta Rack sent at 03/09/2016  9:57 AM EDT ----- Regarding: valium  Contact: [place] in need of clarification of frequency regarding diazepam (VALIUM) 5 MG tablet   [id number] - [place], [address] AT [facility] [address] [phone] (Phone) [phone] (Fax)

## 2016-03-09 NOTE — Telephone Encounter (Signed)
Faxed hardcopy for Valium to [facility] in [place]

## 2016-03-09 NOTE — Telephone Encounter (Signed)
Clarified prescription per PCP instructions take 1/2 twice daily as needed for anxiety. Updated medication list and faxed to the pharmacy new prescription.  Also called to clarify verbally with the pharmacist.

## 2016-03-09 NOTE — Telephone Encounter (Signed)
refaxed valium with correct instructions.

## 2016-03-18 ENCOUNTER — Telehealth: Payer: Self-pay | Admitting: Family Medicine

## 2016-03-18 ENCOUNTER — Encounter: Payer: Self-pay | Admitting: Family Medicine

## 2016-03-18 NOTE — Telephone Encounter (Signed)
Called the patient left detailed message Bone Density Results were Normal, done at [facility] on 03/08/2016 Did ask the patient to call back to confirm message received.

## 2016-03-18 NOTE — Telephone Encounter (Signed)
Patient returned my call and did inform Normal Bone Density. Results abstracted into chart.

## 2016-04-02 DIAGNOSIS — M1711 Unilateral primary osteoarthritis, right knee: Secondary | ICD-10-CM | POA: Diagnosis not present

## 2016-04-02 DIAGNOSIS — M1612 Unilateral primary osteoarthritis, left hip: Secondary | ICD-10-CM | POA: Diagnosis not present

## 2016-04-08 DIAGNOSIS — M5416 Radiculopathy, lumbar region: Secondary | ICD-10-CM

## 2016-04-08 DIAGNOSIS — M542 Cervicalgia: Secondary | ICD-10-CM | POA: Diagnosis not present

## 2016-04-08 DIAGNOSIS — M4156 Other secondary scoliosis, lumbar region: Secondary | ICD-10-CM | POA: Diagnosis not present

## 2016-04-08 DIAGNOSIS — M5137 Other intervertebral disc degeneration, lumbosacral region: Secondary | ICD-10-CM | POA: Diagnosis not present

## 2016-04-08 DIAGNOSIS — M4316 Spondylolisthesis, lumbar region: Secondary | ICD-10-CM | POA: Diagnosis not present

## 2016-04-08 DIAGNOSIS — M5116 Intervertebral disc disorders with radiculopathy, lumbar region: Secondary | ICD-10-CM | POA: Diagnosis not present

## 2016-04-08 DIAGNOSIS — M48061 Spinal stenosis, lumbar region without neurogenic claudication: Secondary | ICD-10-CM | POA: Insufficient documentation

## 2016-04-08 DIAGNOSIS — M47812 Spondylosis without myelopathy or radiculopathy, cervical region: Secondary | ICD-10-CM | POA: Diagnosis not present

## 2016-04-08 DIAGNOSIS — M4186 Other forms of scoliosis, lumbar region: Secondary | ICD-10-CM | POA: Diagnosis not present

## 2016-04-08 DIAGNOSIS — M5030 Other cervical disc degeneration, unspecified cervical region: Secondary | ICD-10-CM | POA: Diagnosis not present

## 2016-04-08 DIAGNOSIS — M4806 Spinal stenosis, lumbar region: Secondary | ICD-10-CM | POA: Diagnosis not present

## 2016-04-09 ENCOUNTER — Telehealth: Payer: Self-pay | Admitting: *Deleted

## 2016-04-09 ENCOUNTER — Telehealth: Payer: Self-pay | Admitting: Family Medicine

## 2016-04-09 NOTE — Telephone Encounter (Signed)
Pt called in because she says that she seen her provider Dr. Redmond Pulling and he advised her to contact PCP to have orders placed for Physical Therapy on her neck . [facility] [address].   CB: [phone]

## 2016-04-09 NOTE — Telephone Encounter (Signed)
Surgery date: 07/21/16 Appt with Dr. Charlett Blake: 07/12/16

## 2016-04-12 NOTE — Telephone Encounter (Signed)
I am OK to refer her but I do not know what her symptoms are that I am referring for. Please check with her. New symptoms or just worse? An injury? Any weakness or radicular symptoms down arms. A release of records from dr [place] office would help me to document why she needs PT and that it would be safe.r

## 2016-04-13 ENCOUNTER — Other Ambulatory Visit: Payer: Self-pay | Admitting: Family Medicine

## 2016-04-13 DIAGNOSIS — M542 Cervicalgia: Secondary | ICD-10-CM

## 2016-04-13 DIAGNOSIS — M1612 Unilateral primary osteoarthritis, left hip: Secondary | ICD-10-CM | POA: Diagnosis not present

## 2016-04-13 NOTE — Telephone Encounter (Signed)
Patient informed of all information.  She had been seen by Dr. Nelva Bush and he did injections, which did not help.  Then she was referred to Dr. Redmond Pulling at [facility] and he would like her to do PT before doing surgery. The patient will have those records faxed to PCP. She is having no new symptoms, only neck pain and unable to sleep due to the pain.

## 2016-04-13 NOTE — Telephone Encounter (Signed)
Called left detailed message to call back.

## 2016-04-15 ENCOUNTER — Other Ambulatory Visit: Payer: Self-pay | Admitting: Family Medicine

## 2016-04-22 ENCOUNTER — Encounter: Payer: Self-pay | Admitting: Physical Therapy

## 2016-04-22 ENCOUNTER — Ambulatory Visit: Payer: Medicare Other | Attending: Family Medicine | Admitting: Physical Therapy

## 2016-04-22 DIAGNOSIS — M542 Cervicalgia: Secondary | ICD-10-CM

## 2016-04-22 DIAGNOSIS — R252 Cramp and spasm: Secondary | ICD-10-CM

## 2016-04-22 NOTE — Therapy (Signed)
[place] [place], [place], [address] Phone: [phone]   Fax:  [phone]  Physical Therapy Evaluation  Patient Details  Name: Stephanie [name] MRN: [id number] Date of Birth: Sep 14, 1932 Referring Provider: Charlett Blake  Encounter Date: 04/22/2016      PT End of Session - 04/22/16 1542    Visit Number 1   Date for PT Re-Evaluation 06/22/16   PT Start Time 1508   PT Stop Time 1607   PT Time Calculation (min) 59 min   Activity Tolerance Patient tolerated treatment well   Behavior During Therapy [facility] for tasks assessed/performed      Past Medical History  Diagnosis Date  . Hypertension   . Situational stress   . Parkinson disease ([place]) 2012  . Vertigo     took antivert for a couple days  . OA (osteoarthritis)     [facility]  . Back pain     due to osteoarthritis  . Dry scalp   . History of chicken pox   . H/O measles   . H/O mumps   . Vaginitis and vulvovaginitis 01/18/2016  . Preventative health care 01/18/2016  . Low back pain 01/18/2016    Past Surgical History  Procedure Laterality Date  . Tonsillectomy and adenoidectomy    . Dilation and curettage of uterus    . Cystectomy      wrist  . Laparoscopy      secondary infertility   . Hand surgery Right     MCP joint for arthritis, prosthesis placed and removed  . Cataract extraction    . Hysteroscopy      resection of polyp  . Biopsy thyroid      benign  . Wrist cyst      right    There were no vitals filed for this visit.       Subjective Assessment - 04/22/16 1519    Subjective Patient reports that she has been having neck pain for over a year.  She reports that the last few years she has been getting injections in the back and neck.     Limitations Reading;House hold activities   Patient Stated Goals have less pain and more motions   Currently in Pain? Yes   Pain Score 3    Pain Location Neck   also c/o pain in the left hip and back   Pain Orientation Mid;Lower   Pain Descriptors / Indicators Aching;Spasm;Shooting   Pain Type Chronic pain   Pain Radiating Towards reports pain shoots up to the right ear   Pain Onset More than a month ago   Pain Frequency Constant   Aggravating Factors  movements of head, trying to sleep at night pain will shoot to an 8-91/0   Pain Relieving Factors heat helps some at best pain a 3/10   Effect of Pain on Daily Activities limits sleep and all ADL's            [facility] PT Assessment - 04/22/16 0001    Assessment   Medical Diagnosis neck pain, degenerative changes   Referring Provider Charlett Blake   Onset Date/Surgical Date 03/22/16   Hand Dominance Right   Prior Therapy no   Precautions   Precautions None   Balance Screen   Has the patient fallen in the past 6 months No   Has the patient had a decrease in activity level because of a fear of  falling?  No   Is the patient reluctant to leave their home because of a fear of falling?  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, unable to rotate head, rounded shoulders   ROM / Strength   AROM / PROM / Strength AROM;Strength   AROM   Overall AROM Comments cervical ROM was decreased 25% for flexion, decreased 75% for extension and side bending, decreaed 50% for rotation, shoulder ROM WFL's   Strength   Overall Strength Comments shoulder strength is 4-/5 with increased pain in the right cervical area   Palpation   Palpation comment she is very tight and tender in the bilateral upper traps and cervical area, significant tightness is palpable here                   [facility] Adult PT Treatment/Exercise - 04/22/16  0001    Modalities   Modalities Electrical Stimulation;Moist Heat   Moist Heat Therapy   Number Minutes Moist Heat 15 Minutes   Moist Heat Location Cervical   Electrical Stimulation   Electrical Stimulation Location cervical spine   Electrical Stimulation Action IFC   Electrical Stimulation Parameters supine   Electrical Stimulation Goals Pain                PT Education - 04/22/16 1542    Education provided Yes   Education Details cervical and scapular retraction, shoulder shrugs   Person(s) Educated Patient   Methods Explanation;Demonstration;Handout   Comprehension Verbalized understanding;Returned demonstration          PT Short Term Goals - 04/22/16 1547    PT SHORT TERM GOAL #1   Title independent with initialHEP   Time 2   Period Weeks   Status New           PT Long Term Goals - 04/22/16 1547    PT LONG TERM GOAL #1   Title understand proper posture and body mechanics   Time 8   Period Weeks   Status New   PT LONG TERM GOAL #2   Title increase cervical ROM 25%   Time 8   Period Weeks   Status New   PT LONG TERM GOAL #3   Title decrease pain 25%   Time 8   Period Weeks   Status New   PT LONG TERM GOAL #4   Title report 25% less difficulty with driving   Time 8   Period Weeks   Status New   PT LONG TERM GOAL #5   Title report sleeping 25% better   Time 8   Period Weeks   Status New               Plan - 04/22/16 1544    Clinical Impression Statement Patient reports neck pain for about 2-3 years.  She has DDD, has significant spasms in the cervical paraspinals and some spasms in the right SCM mm.  Her cervical ROM is very limited.  She does have left hip pain due to DJD.   Rehab Potential Good   PT Frequency 2x / week   PT Duration 8 weeks   PT Treatment/Interventions ADLs/Self Care Home Management;Cryotherapy;Electrical Stimulation;Moist  Heat;Therapeutic exercise;Therapeutic activities;Ultrasound;Traction;Patient/family  education;Manual techniques   PT Next Visit Plan may try traction, see if exercises are okay   Consulted and Agree with Plan of Care Patient      Patient will benefit from skilled therapeutic intervention in order to improve the following deficits and impairments:  Abnormal gait, Decreased range of motion, Decreased strength, Increased muscle spasms, Impaired perceived functional ability, Improper body mechanics, Postural dysfunction, Pain  Visit Diagnosis: Cervicalgia - Plan: PT plan of care cert/re-cert  Cramp and spasm - Plan: PT plan of care cert/re-cert      G-Codes - [id number]    Functional Assessment Tool Used 68% limitation with FOTO   Functional Limitation Self care   Self Care Current Status [id number]) At least 60 percent but less than 80 percent impaired, limited or restricted   Self Care Goal Status [id number]) At least 40 percent but less than 60 percent impaired, limited or restricted       Problem List Patient Active Problem List   Diagnosis Date Noted  . Vaginitis and vulvovaginitis 01/18/2016  . Preventative health care 01/18/2016  . Low back pain 01/18/2016  . History of chicken pox   . Hyperlipidemia 12/15/2014  . Allergic rhinitis 06/25/2014  . Allergy to bee sting 06/10/2014  . TMJ tenderness 11/16/2013  . Other malaise and fatigue 05/07/2013  . Trapezius muscle spasm 11/27/2012  . Parkinson's disease ([place]) 05/24/2012  . General medical examination 11/24/2011  . HTN (hypertension) 08/31/2011  . Depression 08/31/2011  . Arthritis 08/31/2011  . Tremor 08/31/2011  . Benign hypertensive heart disease without heart failure 05/12/2011    Sumner Boast., PT 04/22/2016, 3:50 PM  [address] [place], [place], [address] Phone: [phone]   Fax:  [phone]  Name: Stephanie [name] MRN: [id number] Date of Birth: [birth year]

## 2016-04-26 ENCOUNTER — Encounter: Payer: Self-pay | Admitting: Physical Therapy

## 2016-04-26 ENCOUNTER — Ambulatory Visit: Payer: Medicare Other | Admitting: Physical Therapy

## 2016-04-26 DIAGNOSIS — M542 Cervicalgia: Secondary | ICD-10-CM

## 2016-04-26 DIAGNOSIS — R252 Cramp and spasm: Secondary | ICD-10-CM | POA: Diagnosis not present

## 2016-04-26 NOTE — Therapy (Signed)
[place] [address] [place], [place], [address] Phone: [phone]   Fax:  [phone]  Physical Therapy Treatment  Patient Details  Name: Stephanie [name] MRN: [id number] Date of Birth: 1931/11/30 Referring Provider: Charlett Blake  Encounter Date: 04/26/2016      PT End of Session - 04/26/16 1515    Visit Number 2   PT Start Time 1430   PT Stop Time 1526   PT Time Calculation (min) 56 min   Activity Tolerance Patient tolerated treatment well;Patient limited by pain   Behavior During Therapy [facility] for tasks assessed/performed      Past Medical History  Diagnosis Date  . Hypertension   . Situational stress   . Parkinson disease ([place]) 2012  . Vertigo     took antivert for a couple days  . OA (osteoarthritis)     [facility]  . Back pain     due to osteoarthritis  . Dry scalp   . History of chicken pox   . H/O measles   . H/O mumps   . Vaginitis and vulvovaginitis 01/18/2016  . Preventative health care 01/18/2016  . Low back pain 01/18/2016    Past Surgical History  Procedure Laterality Date  . Tonsillectomy and adenoidectomy    . Dilation and curettage of uterus    . Cystectomy      wrist  . Laparoscopy      secondary infertility   . Hand surgery Right     MCP joint for arthritis, prosthesis placed and removed  . Cataract extraction    . Hysteroscopy      resection of polyp  . Biopsy thyroid      benign  . Wrist cyst      right    There were no vitals filed for this visit.      Subjective Assessment - 04/26/16 1434    Subjective "I slept better the night after I left here" Pt reports that her HEP causes pain.    Currently in Pain? Yes   Pain Score 7   with movement   Pain Location Neck                         [facility] Adult PT Treatment/Exercise - 04/26/16 0001    Exercises   Exercises Neck   Neck Exercises: Machines for Strengthening   UBE  (Upper Arm Bike) L1 36frd/3rev   Modalities   Modalities Electrical Stimulation;Moist Heat   Moist Heat Therapy   Number Minutes Moist Heat 15 Minutes   Moist Heat Location Cervical   Electrical Stimulation   Electrical Stimulation Location cervical spine   Electrical Stimulation Action IFC   Electrical Stimulation Parameters supine    Electrical Stimulation Goals Pain   Manual Therapy   Manual Therapy Soft tissue mobilization;Passive ROM   Soft tissue mobilization posterior para spinales   Passive ROM Cervical spine all directions, Little PROM                  PT Short Term Goals - 04/22/16 1547    PT SHORT TERM GOAL #1   Title independent with initialHEP   Time 2   Period Weeks   Status New           PT Long Term Goals - 04/22/16 1547    PT LONG TERM GOAL #1   Title understand proper posture and  body mechanics   Time 8   Period Weeks   Status New   PT LONG TERM GOAL #2   Title increase cervical ROM 25%   Time 8   Period Weeks   Status New   PT LONG TERM GOAL #3   Title decrease pain 25%   Time 8   Period Weeks   Status New   PT LONG TERM GOAL #4   Title report 25% less difficulty with driving   Time 8   Period Weeks   Status New   PT LONG TERM GOAL #5   Title report sleeping 25% better   Time 8   Period Weeks   Status New               Plan - 04/26/16 1515    Clinical Impression Statement Pt able to complete all scapular interventions without neck pain. Pt cervical ROM very limited, Tightness noted in posterior neck with MT. Pt has little cervical PROM and reports pain with passive cervical rotation L>R.    Rehab Potential Good   PT Frequency 2x / week   PT Duration 8 weeks   PT Treatment/Interventions ADLs/Self Care Home Management;Cryotherapy;Electrical Stimulation;Moist Heat;Therapeutic exercise;Therapeutic activities;Ultrasound;Traction;Patient/family education;Manual techniques   PT Next Visit Plan asscess tx,       Patient  will benefit from skilled therapeutic intervention in order to improve the following deficits and impairments:  Abnormal gait, Decreased range of motion, Decreased strength, Increased muscle spasms, Impaired perceived functional ability, Improper body mechanics, Postural dysfunction, Pain  Visit Diagnosis: Cramp and spasm  Cervicalgia     Problem List Patient Active Problem List   Diagnosis Date Noted  . Vaginitis and vulvovaginitis 01/18/2016  . Preventative health care 01/18/2016  . Low back pain 01/18/2016  . History of chicken pox   . Hyperlipidemia 12/15/2014  . Allergic rhinitis 06/25/2014  . Allergy to bee sting 06/10/2014  . TMJ tenderness 11/16/2013  . Other malaise and fatigue 05/07/2013  . Trapezius muscle spasm 11/27/2012  . Parkinson's disease ([place]) 05/24/2012  . General medical examination 11/24/2011  . HTN (hypertension) 08/31/2011  . Depression 08/31/2011  . Arthritis 08/31/2011  . Tremor 08/31/2011  . Benign hypertensive heart disease without heart failure 05/12/2011    Scot Jun, PTA  04/26/2016, 3:18 PM  [address] [place], [place], [address] Phone: [phone]   Fax:  [phone]  Name: Stephanie [name] MRN: [id number] Date of Birth: 28-Jun-1932

## 2016-04-27 ENCOUNTER — Ambulatory Visit: Payer: Medicare Other | Admitting: Physical Therapy

## 2016-04-29 ENCOUNTER — Ambulatory Visit: Payer: Medicare Other | Admitting: Physical Therapy

## 2016-04-29 ENCOUNTER — Encounter: Payer: Self-pay | Admitting: Physical Therapy

## 2016-04-29 DIAGNOSIS — M542 Cervicalgia: Secondary | ICD-10-CM | POA: Diagnosis not present

## 2016-04-29 DIAGNOSIS — R252 Cramp and spasm: Secondary | ICD-10-CM | POA: Diagnosis not present

## 2016-04-29 NOTE — Therapy (Signed)
[place] [address], [place], [address] Phone: [phone]   Fax:  [phone]  Physical Therapy Treatment  Patient Details  Name: Stephanie [name] MRN: [id number] Date of Birth: 20-Apr-1932 Referring Provider: Charlett Blake  Encounter Date: 04/29/2016      PT End of Session - 04/29/16 1148    Visit Number 5   Date for PT Re-Evaluation 06/22/16   PT Start Time 1100   PT Stop Time 1143   PT Time Calculation (min) 43 min      Past Medical History  Diagnosis Date  . Hypertension   . Situational stress   . Parkinson disease ([place]) 2012  . Vertigo     took antivert for a couple days  . OA (osteoarthritis)     [facility]  . Back pain     due to osteoarthritis  . Dry scalp   . History of chicken pox   . H/O measles   . H/O mumps   . Vaginitis and vulvovaginitis 01/18/2016  . Preventative health care 01/18/2016  . Low back pain 01/18/2016    Past Surgical History  Procedure Laterality Date  . Tonsillectomy and adenoidectomy    . Dilation and curettage of uterus    . Cystectomy      wrist  . Laparoscopy      secondary infertility   . Hand surgery Right     MCP joint for arthritis, prosthesis placed and removed  . Cataract extraction    . Hysteroscopy      resection of polyp  . Biopsy thyroid      benign  . Wrist cyst      right    There were no vitals filed for this visit.      Subjective Assessment - 04/29/16 1102    Subjective "My neck really hurt after I left here, but I feel like I have more motion"   Currently in Pain? Yes   Pain Score 6    Pain Location Neck                         [facility] Adult PT Treatment/Exercise - 04/29/16 0001    Exercises   Exercises Neck   Neck Exercises: Machines for Strengthening   UBE (Upper Arm Bike) L1 45frd/3rev   Cybex Row 15lb 2x10   Other Machines for Strengthening Lats 15lb 2x1o   Neck Exercises:  Standing   Other Standing Exercises UE extensions red tband 2x10   Other Standing Exercises UE ER yellow Tband 2x15    Neck Exercises: Supine   Other Supine Exercise Neck retractions 2x10   Modalities   Modalities Ultrasound   Ultrasound   Ultrasound Location Upper R trap   Ultrasound Parameters 1MHz 1.2 w/cm2   Ultrasound Goals Pain   Manual Therapy   Manual Therapy Soft tissue mobilization;Passive ROM   Soft tissue mobilization posterior para spinales   Passive ROM Cervical spine all directions, Little PROM                  PT Short Term Goals - 04/22/16 1547    PT SHORT TERM GOAL #1   Title independent with initialHEP   Time 2   Period Weeks   Status New           PT Long Term Goals - 04/22/16 1547    PT LONG TERM GOAL #  1   Title understand proper posture and body mechanics   Time 8   Period Weeks   Status New   PT LONG TERM GOAL #2   Title increase cervical ROM 25%   Time 8   Period Weeks   Status New   PT LONG TERM GOAL #3   Title decrease pain 25%   Time 8   Period Weeks   Status New   PT LONG TERM GOAL #4   Title report 25% less difficulty with driving   Time 8   Period Weeks   Status New   PT LONG TERM GOAL #5   Title report sleeping 25% better   Time 8   Period Weeks   Status New               Plan - 04/29/16 1145    Clinical Impression Statement No issues with UE strengthening exercises. Pt neck remains stiff with MT and reports pain at her limited end ranges.    Rehab Potential Good   PT Frequency 2x / week   PT Duration 8 weeks   PT Treatment/Interventions ADLs/Self Care Home Management;Cryotherapy;Electrical Stimulation;Moist Heat;Therapeutic exercise;Therapeutic activities;Ultrasound;Traction;Patient/family education;Manual techniques   PT Next Visit Plan asscess tx, progress with MT       Patient will benefit from skilled therapeutic intervention in order to improve the following deficits and impairments:  Abnormal  gait, Decreased range of motion, Decreased strength, Increased muscle spasms, Impaired perceived functional ability, Improper body mechanics, Postural dysfunction, Pain  Visit Diagnosis: Cramp and spasm  Cervicalgia     Problem List Patient Active Problem List   Diagnosis Date Noted  . Vaginitis and vulvovaginitis 01/18/2016  . Preventative health care 01/18/2016  . Low back pain 01/18/2016  . History of chicken pox   . Hyperlipidemia 12/15/2014  . Allergic rhinitis 06/25/2014  . Allergy to bee sting 06/10/2014  . TMJ tenderness 11/16/2013  . Other malaise and fatigue 05/07/2013  . Trapezius muscle spasm 11/27/2012  . Parkinson's disease ([place]) 05/24/2012  . General medical examination 11/24/2011  . HTN (hypertension) 08/31/2011  . Depression 08/31/2011  . Arthritis 08/31/2011  . Tremor 08/31/2011  . Benign hypertensive heart disease without heart failure 05/12/2011    Scot Jun, PTA 04/29/2016, 11:49 AM  [place] [place], [place], [address] Phone: [phone]   Fax:  [phone]  Name: Stephanie [name] MRN: [id number] Date of Birth: 10-25-1932

## 2016-05-04 ENCOUNTER — Encounter: Payer: Self-pay | Admitting: Physical Therapy

## 2016-05-04 ENCOUNTER — Ambulatory Visit: Payer: Medicare Other | Admitting: Physical Therapy

## 2016-05-04 DIAGNOSIS — R252 Cramp and spasm: Secondary | ICD-10-CM | POA: Diagnosis not present

## 2016-05-04 DIAGNOSIS — M542 Cervicalgia: Secondary | ICD-10-CM | POA: Diagnosis not present

## 2016-05-04 NOTE — Therapy (Signed)
[place], [place], [address] Phone: [phone]   Fax:  [phone]  Physical Therapy Treatment  Patient Details  Name: Stephanie [name] MRN: [id number] Date of Birth: 1932-02-02 Referring Provider: Charlett Blake  Encounter Date: 05/04/2016      PT End of Session - 05/04/16 1005    Visit Number 4   Date for PT Re-Evaluation 06/22/16   PT Start Time 1005   PT Stop Time 1100   PT Time Calculation (min) 55 min      Past Medical History  Diagnosis Date  . Hypertension   . Situational stress   . Parkinson disease ([place]) 2012  . Vertigo     took antivert for a couple days  . OA (osteoarthritis)     [facility]  . Back pain     due to osteoarthritis  . Dry scalp   . History of chicken pox   . H/O measles   . H/O mumps   . Vaginitis and vulvovaginitis 01/18/2016  . Preventative health care 01/18/2016  . Low back pain 01/18/2016    Past Surgical History  Procedure Laterality Date  . Tonsillectomy and adenoidectomy    . Dilation and curettage of uterus    . Cystectomy      wrist  . Laparoscopy      secondary infertility   . Hand surgery Right     MCP joint for arthritis, prosthesis placed and removed  . Cataract extraction    . Hysteroscopy      resection of polyp  . Biopsy thyroid      benign  . Wrist cyst      right    There were no vitals filed for this visit.      Subjective Assessment - 05/04/16 1034    Subjective husband admitted to hospital this weekend so I am very tired and stressed, increased pain/tightness   Currently in Pain? Yes   Pain Score 7    Pain Location Neck                         [facility] Adult PT Treatment/Exercise - 05/04/16 0001    Moist Heat Therapy   Number Minutes Moist Heat --  15   Moist Heat Location Cervical   Electrical Stimulation   Electrical Stimulation Location cervical spine   Electrical  Stimulation Action IFC   Electrical Stimulation Parameters supine   Electrical Stimulation Goals Pain   Ultrasound   Ultrasound Location cerv and traps   Ultrasound Parameters 1 mhz 1.2 w/cm 2   Ultrasound Goals Pain;Other (Comment)  tightness   Manual Therapy   Manual Therapy Soft tissue mobilization   Soft tissue mobilization cerv/trap/rhom                  PT Short Term Goals - 05/04/16 1039    PT SHORT TERM GOAL #1   Title independent with initialHEP   Status Achieved           PT Long Term Goals - 04/22/16 1547    PT LONG TERM GOAL #1   Title understand proper posture and body mechanics   Time 8   Period Weeks   Status New   PT LONG TERM GOAL #2   Title increase cervical ROM 25%   Time 8   Period Weeks   Status New   PT  LONG TERM GOAL #3   Title decrease pain 25%   Time 8   Period Weeks   Status New   PT LONG TERM GOAL #4   Title report 25% less difficulty with driving   Time 8   Period Weeks   Status New   PT LONG TERM GOAL #5   Title report sleeping 25% better   Time 8   Period Weeks   Status New               Plan - 05/04/16 1037    Clinical Impression Statement very tight in cerv/UT and rhomboids but responded well to MT and modalities. Pt reports complaince with HEP up until this weekend since husband is in hospital.   PT Next Visit Plan assess tx, progress with MT       Patient will benefit from skilled therapeutic intervention in order to improve the following deficits and impairments:  Abnormal gait, Decreased range of motion, Decreased strength, Increased muscle spasms, Impaired perceived functional ability, Improper body mechanics, Postural dysfunction, Pain  Visit Diagnosis: Cervicalgia     Problem List Patient Active Problem List   Diagnosis Date Noted  . Vaginitis and vulvovaginitis 01/18/2016  . Preventative health care 01/18/2016  . Low back pain 01/18/2016  . History of chicken pox   . Hyperlipidemia  12/15/2014  . Allergic rhinitis 06/25/2014  . Allergy to bee sting 06/10/2014  . TMJ tenderness 11/16/2013  . Other malaise and fatigue 05/07/2013  . Trapezius muscle spasm 11/27/2012  . Parkinson's disease ([place]) 05/24/2012  . General medical examination 11/24/2011  . HTN (hypertension) 08/31/2011  . Depression 08/31/2011  . Arthritis 08/31/2011  . Tremor 08/31/2011  . Benign hypertensive heart disease without heart failure 05/12/2011    Siana Panameno,ANGIE PTA 05/04/2016, 10:40 AM  [address] [place], [place], [address] Phone: [phone]   Fax:  [phone]  Name: Stephanie [name] MRN: [id number] Date of Birth: 08-12-1932

## 2016-05-07 ENCOUNTER — Encounter: Payer: Self-pay | Admitting: Physical Therapy

## 2016-05-07 ENCOUNTER — Ambulatory Visit: Payer: Medicare Other | Admitting: Physical Therapy

## 2016-05-07 DIAGNOSIS — M542 Cervicalgia: Secondary | ICD-10-CM

## 2016-05-07 DIAGNOSIS — R252 Cramp and spasm: Secondary | ICD-10-CM | POA: Diagnosis not present

## 2016-05-07 NOTE — Therapy (Signed)
[place], [place], [address] Phone: [phone]   Fax:  [phone]  Physical Therapy Treatment  Patient Details  Name: Stephanie [name] MRN: [id number] Date of Birth: Sep 17, 1932 Referring Provider: Charlett Blake  Encounter Date: 05/07/2016      PT End of Session - 05/07/16 1002    Visit Number 5   Date for PT Re-Evaluation 06/22/16   PT Start Time 0930   PT Stop Time 1030   PT Time Calculation (min) 60 min      Past Medical History  Diagnosis Date  . Hypertension   . Situational stress   . Parkinson disease ([place]) 2012  . Vertigo     took antivert for a couple days  . OA (osteoarthritis)     [facility]  . Back pain     due to osteoarthritis  . Dry scalp   . History of chicken pox   . H/O measles   . H/O mumps   . Vaginitis and vulvovaginitis 01/18/2016  . Preventative health care 01/18/2016  . Low back pain 01/18/2016    Past Surgical History  Procedure Laterality Date  . Tonsillectomy and adenoidectomy    . Dilation and curettage of uterus    . Cystectomy      wrist  . Laparoscopy      secondary infertility   . Hand surgery Right     MCP joint for arthritis, prosthesis placed and removed  . Cataract extraction    . Hysteroscopy      resection of polyp  . Biopsy thyroid      benign  . Wrist cyst      right    There were no vitals filed for this visit.      Subjective Assessment - 05/07/16 1000    Subjective husband cam ehome and I am exhausted caring for him, however last tx helped and I do feel some better   Currently in Pain? Yes   Pain Score 6    Pain Location Neck                         [facility] Adult PT Treatment/Exercise - 05/07/16 0001    Moist Heat Therapy   Number Minutes Moist Heat 15 Minutes   Moist Heat Location Cervical   Electrical Stimulation   Electrical Stimulation Location cervical spine   Electrical  Stimulation Action IFC   Electrical Stimulation Parameters supine   Electrical Stimulation Goals Pain   Ultrasound   Ultrasound Location cerv/trap   Ultrasound Parameters 2mHz 100% 1.3 w/cm 2   Ultrasound Goals Pain  tightness   Manual Therapy   Manual Therapy Soft tissue mobilization;Taping   Soft tissue mobilization cerv/trap/rhom   Kinesiotex Create Space  I strip traps                  PT Short Term Goals - 05/04/16 1039    PT SHORT TERM GOAL #1   Title independent with initialHEP   Status Achieved           PT Long Term Goals - 05/07/16 1006    PT LONG TERM GOAL #1   Title understand proper posture and body mechanics   Status On-going   PT LONG TERM GOAL #2   Title increase cervical ROM 25%   Status On-going   PT LONG TERM GOAL #3  Title decrease pain 25%   Status On-going   PT LONG TERM GOAL #4   Title report 25% less difficulty with driving   Status On-going   PT LONG TERM GOAL #5   Title report sleeping 25% better   Status On-going               Plan - 05/07/16 1003    Clinical Impression Statement pt very tight in cerv and traps and limited ROM, no changes in ROM form eval. Increase stress caring for husband but is looking to increase pain help. Pt does see benefits from PT interventions.   PT Next Visit Plan MT,modalities and slow;y progress ex and ROM      Patient will benefit from skilled therapeutic intervention in order to improve the following deficits and impairments:  Abnormal gait, Decreased range of motion, Decreased strength, Increased muscle spasms, Impaired perceived functional ability, Improper body mechanics, Postural dysfunction, Pain  Visit Diagnosis: Cervicalgia  Cramp and spasm     Problem List Patient Active Problem List   Diagnosis Date Noted  . Vaginitis and vulvovaginitis 01/18/2016  . Preventative health care 01/18/2016  . Low back pain 01/18/2016  . History of chicken pox   . Hyperlipidemia  12/15/2014  . Allergic rhinitis 06/25/2014  . Allergy to bee sting 06/10/2014  . TMJ tenderness 11/16/2013  . Other malaise and fatigue 05/07/2013  . Trapezius muscle spasm 11/27/2012  . Parkinson's disease ([place]) 05/24/2012  . General medical examination 11/24/2011  . HTN (hypertension) 08/31/2011  . Depression 08/31/2011  . Arthritis 08/31/2011  . Tremor 08/31/2011  . Benign hypertensive heart disease without heart failure 05/12/2011    Stephanie [name],Stephanie [name] 05/07/2016, 10:07 AM  [address] [address] [place], [place], [address] Phone: [phone]   Fax:  [phone]  Name: Stephanie [name] MRN: [id number] Date of Birth: [birth year]

## 2016-05-11 ENCOUNTER — Ambulatory Visit: Payer: Medicare Other | Admitting: Physical Therapy

## 2016-05-11 ENCOUNTER — Encounter: Payer: Self-pay | Admitting: Physical Therapy

## 2016-05-11 DIAGNOSIS — R252 Cramp and spasm: Secondary | ICD-10-CM | POA: Diagnosis not present

## 2016-05-11 DIAGNOSIS — M542 Cervicalgia: Secondary | ICD-10-CM | POA: Diagnosis not present

## 2016-05-11 NOTE — Therapy (Signed)
[place], [place], [address] Phone: [phone]   Fax:  [phone]  Physical Therapy Treatment  Patient Details  Name: Stephanie [name] MRN: [id number] Date of Birth: Aug 06, 1932 Referring Provider: Charlett Blake  Encounter Date: 05/11/2016      PT End of Session - 05/11/16 1210    Visit Number 6   Date for PT Re-Evaluation 06/22/16   PT Start Time 1135   PT Stop Time 1230   PT Time Calculation (min) 55 min      Past Medical History  Diagnosis Date  . Hypertension   . Situational stress   . Parkinson disease ([place]) 2012  . Vertigo     took antivert for a couple days  . OA (osteoarthritis)     [facility]  . Back pain     due to osteoarthritis  . Dry scalp   . History of chicken pox   . H/O measles   . H/O mumps   . Vaginitis and vulvovaginitis 01/18/2016  . Preventative health care 01/18/2016  . Low back pain 01/18/2016    Past Surgical History  Procedure Laterality Date  . Tonsillectomy and adenoidectomy    . Dilation and curettage of uterus    . Cystectomy      wrist  . Laparoscopy      secondary infertility   . Hand surgery Right     MCP joint for arthritis, prosthesis placed and removed  . Cataract extraction    . Hysteroscopy      resection of polyp  . Biopsy thyroid      benign  . Wrist cyst      right    There were no vitals filed for this visit.      Subjective Assessment - 05/11/16 1206    Subjective " whatever you are doing is helping"   Currently in Pain? Yes   Pain Score 5    Pain Location Neck            [facility] PT Assessment - 05/11/16 0001    AROM   Overall AROM Comments flexion WFL, all others 50-75% decreased                     [facility] Adult PT Treatment/Exercise - 05/11/16 0001    Modalities   Modalities Traction   Ultrasound   Ultrasound Location cerv/trap   Ultrasound Parameters 1 Mhz 1.2 w/cm 2  COMBO   Ultrasound Goals Pain;Other (Comment)  tightness   Traction   Type of Traction Cervical   Min (lbs) 10   Time 12   Manual Therapy   Manual Therapy Soft tissue mobilization;Taping   Soft tissue mobilization cerv/trap/rhom   Kinesiotex Create Space                  PT Short Term Goals - 05/04/16 1039    PT SHORT TERM GOAL #1   Title independent with initialHEP   Status Achieved           PT Long Term Goals - 05/07/16 1006    PT LONG TERM GOAL #1   Title understand proper posture and body mechanics   Status On-going   PT LONG TERM GOAL #2   Title increase cervical ROM 25%   Status On-going   PT LONG TERM GOAL #3   Title decrease pain 25%   Status On-going  PT LONG TERM GOAL #4   Title report 25% less difficulty with driving   Status On-going   PT LONG TERM GOAL #5   Title report sleeping 25% better   Status On-going               Plan - 05/11/16 1210    Clinical Impression Statement decreased tightness with increased mvmt noted in cervical spine. responded well to combo and traction. increased c/o pain on RT vs left side   PT Next Visit Plan assess today tx and progress. slowly add in ther ex but pt is responding well with MT and modalities for tightness and limited ROM      Patient will benefit from skilled therapeutic intervention in order to improve the following deficits and impairments:  Abnormal gait, Decreased range of motion, Decreased strength, Increased muscle spasms, Impaired perceived functional ability, Improper body mechanics, Postural dysfunction, Pain  Visit Diagnosis: Cervicalgia  Cramp and spasm     Problem List Patient Active Problem List   Diagnosis Date Noted  . Vaginitis and vulvovaginitis 01/18/2016  . Preventative health care 01/18/2016  . Low back pain 01/18/2016  . History of chicken pox   . Hyperlipidemia 12/15/2014  . Allergic rhinitis 06/25/2014  . Allergy to bee sting 06/10/2014  . TMJ tenderness  11/16/2013  . Other malaise and fatigue 05/07/2013  . Trapezius muscle spasm 11/27/2012  . Parkinson's disease ([place]) 05/24/2012  . General medical examination 11/24/2011  . HTN (hypertension) 08/31/2011  . Depression 08/31/2011  . Arthritis 08/31/2011  . Tremor 08/31/2011  . Benign hypertensive heart disease without heart failure 05/12/2011    Takeia Ciaravino,ANGIE PTA 05/11/2016, 12:14 PM  [place], [place], [address] Phone: [phone]   Fax:  [phone]  Name: FRYDA PEINADO MRN: [id number] Date of Birth: [birth year]

## 2016-05-13 ENCOUNTER — Ambulatory Visit: Payer: Medicare Other | Admitting: Physical Therapy

## 2016-05-13 ENCOUNTER — Encounter: Payer: Self-pay | Admitting: Physical Therapy

## 2016-05-13 DIAGNOSIS — R252 Cramp and spasm: Secondary | ICD-10-CM

## 2016-05-13 DIAGNOSIS — M542 Cervicalgia: Secondary | ICD-10-CM | POA: Diagnosis not present

## 2016-05-13 NOTE — Therapy (Signed)
[place], [place], [address] Phone: [phone]   Fax:  [phone]  Physical Therapy Treatment  Patient Details  Name: Stephanie [name] MRN: [id number] Date of Birth: Apr 18, 1932 Referring Provider: Charlett Blake  Encounter Date: 05/13/2016      PT End of Session - 05/13/16 1206    Visit Number 7   Date for PT Re-Evaluation 06/22/16   PT Start Time 1130   PT Stop Time 1225   PT Time Calculation (min) 55 min      Past Medical History  Diagnosis Date  . Hypertension   . Situational stress   . Parkinson disease ([place]) 2012  . Vertigo     took antivert for a couple days  . OA (osteoarthritis)     [facility]  . Back pain     due to osteoarthritis  . Dry scalp   . History of chicken pox   . H/O measles   . H/O mumps   . Vaginitis and vulvovaginitis 01/18/2016  . Preventative health care 01/18/2016  . Low back pain 01/18/2016    Past Surgical History  Procedure Laterality Date  . Tonsillectomy and adenoidectomy    . Dilation and curettage of uterus    . Cystectomy      wrist  . Laparoscopy      secondary infertility   . Hand surgery Right     MCP joint for arthritis, prosthesis placed and removed  . Cataract extraction    . Hysteroscopy      resection of polyp  . Biopsy thyroid      benign  . Wrist cyst      right    There were no vitals filed for this visit.      Subjective Assessment - 05/13/16 1203    Subjective 50% better because it doesnt wake me at night, I am sure stress of caring for husband and limping with hip don't help.   Currently in Pain? Yes   Pain Score 4    Pain Location Neck                         [facility] Adult PT Treatment/Exercise - 05/13/16 0001    Ultrasound   Ultrasound Location cerv/trap   Ultrasound Parameters 1Mhz, 1.2 w/cm2   Ultrasound Goals Pain  COMBO TX with estim   Traction   Type of Traction  Cervical   Min (lbs) 15   Time 12   Manual Therapy   Manual Therapy Soft tissue mobilization;Taping   Soft tissue mobilization cerv/trap/rhom                  PT Short Term Goals - 05/04/16 1039    PT SHORT TERM GOAL #1   Title independent with initialHEP   Status Achieved           PT Long Term Goals - 05/13/16 1206    PT LONG TERM GOAL #1   Title understand proper posture and body mechanics   Status Achieved   PT LONG TERM GOAL #2   Title increase cervical ROM 25%   Baseline limited 75% very rigid and guarded   Status On-going   PT LONG TERM GOAL #3   Title decrease pain 25%   Status Achieved   PT LONG TERM GOAL #4   Title report 25% less difficulty with driving  Status On-going   PT LONG TERM GOAL #5   Title report sleeping 25% better   Status Achieved               Plan - 05/13/16 1206    Clinical Impression Statement pain goal met,sleeping goal met, postural goal met. Decreased tightness noted but still very rigid limited ROM.   PT Next Visit Plan slowly add back in ther ex,cerv ROM      Patient will benefit from skilled therapeutic intervention in order to improve the following deficits and impairments:  Abnormal gait, Decreased range of motion, Decreased strength, Increased muscle spasms, Impaired perceived functional ability, Improper body mechanics, Postural dysfunction, Pain  Visit Diagnosis: Cervicalgia  Cramp and spasm     Problem List Patient Active Problem List   Diagnosis Date Noted  . Vaginitis and vulvovaginitis 01/18/2016  . Preventative health care 01/18/2016  . Low back pain 01/18/2016  . History of chicken pox   . Hyperlipidemia 12/15/2014  . Allergic rhinitis 06/25/2014  . Allergy to bee sting 06/10/2014  . TMJ tenderness 11/16/2013  . Other malaise and fatigue 05/07/2013  . Trapezius muscle spasm 11/27/2012  . Parkinson's disease ([place]) 05/24/2012  . General medical examination 11/24/2011  . HTN  (hypertension) 08/31/2011  . Depression 08/31/2011  . Arthritis 08/31/2011  . Tremor 08/31/2011  . Benign hypertensive heart disease without heart failure 05/12/2011    PAYSEUR,ANGIE PTA 05/13/2016, 12:08 PM  [place] [address] [place], [place], [address] Phone: [phone]   Fax:  [phone]  Name: BRYNLYN DADE MRN: [id number] Date of Birth: [birth year]

## 2016-05-19 ENCOUNTER — Ambulatory Visit: Payer: Medicare Other | Attending: Family Medicine | Admitting: Physical Therapy

## 2016-05-19 ENCOUNTER — Encounter: Payer: Self-pay | Admitting: Physical Therapy

## 2016-05-19 DIAGNOSIS — M542 Cervicalgia: Secondary | ICD-10-CM | POA: Diagnosis not present

## 2016-05-19 DIAGNOSIS — R252 Cramp and spasm: Secondary | ICD-10-CM | POA: Insufficient documentation

## 2016-05-19 NOTE — Therapy (Signed)
[place], [place], [address] Phone: [phone]   Fax:  [phone]  Physical Therapy Treatment  Patient Details  Name: Stephanie [name] MRN: [id number] Date of Birth: [birth year] Referring Provider: Charlett Blake  Encounter Date: 05/19/2016      PT End of Session - 05/19/16 1211    Visit Number 8   Date for PT Re-Evaluation 06/22/16   PT Start Time 1140   PT Stop Time 1226   PT Time Calculation (min) 46 min   Activity Tolerance Patient limited by pain      Past Medical History  Diagnosis Date  . Hypertension   . Situational stress   . Parkinson disease ([place]) 2012  . Vertigo     took antivert for a couple days  . OA (osteoarthritis)     [facility]  . Back pain     due to osteoarthritis  . Dry scalp   . History of chicken pox   . H/O measles   . H/O mumps   . Vaginitis and vulvovaginitis 01/18/2016  . Preventative health care 01/18/2016  . Low back pain 01/18/2016    Past Surgical History  Procedure Laterality Date  . Tonsillectomy and adenoidectomy    . Dilation and curettage of uterus    . Cystectomy      wrist  . Laparoscopy      secondary infertility   . Hand surgery Right     MCP joint for arthritis, prosthesis placed and removed  . Cataract extraction    . Hysteroscopy      resection of polyp  . Biopsy thyroid      benign  . Wrist cyst      right    There were no vitals filed for this visit.      Subjective Assessment - 05/19/16 1145    Subjective "Whatever she is doing is helping"   Currently in Pain? Yes   Pain Score 4    Pain Location Neck                         [facility] Adult PT Treatment/Exercise - 05/19/16 0001    Ultrasound   Ultrasound Location cerv/trap   Ultrasound Parameters 1Mhz 1.2w/cm2   Ultrasound Goals Pain  COMBO tx with e-stim   Traction   Type of Traction Cervical   Min (lbs) 15   Time 12   Manual Therapy   Manual Therapy Soft tissue mobilization;Taping   Soft tissue mobilization cerv/trap/rhom                  PT Short Term Goals - 05/04/16 1039    PT SHORT TERM GOAL #1   Title independent with initialHEP   Status Achieved           PT Long Term Goals - 05/13/16 1206    PT LONG TERM GOAL #1   Title understand proper posture and body mechanics   Status Achieved   PT LONG TERM GOAL #2   Title increase cervical ROM 25%   Baseline limited 75% very rigid and guarded   Status On-going   PT LONG TERM GOAL #3   Title decrease pain 25%   Status Achieved   PT LONG TERM GOAL #4   Title report 25% less difficulty with driving   Status On-going   PT LONG TERM GOAL #5  Title report sleeping 25% better   Status Achieved               Plan - 05/19/16 1214    Clinical Impression Statement Pt reports contiued relief form previous treatment so plan stayed the same. Pt continues to reports no pain when sleeping. Pt neck remain rigid with limited ROM   Rehab Potential Good   PT Frequency 2x / week   PT Duration 8 weeks   PT Next Visit Plan slowly add back in ther ex,cerv ROM      Patient will benefit from skilled therapeutic intervention in order to improve the following deficits and impairments:  Abnormal gait, Decreased range of motion, Decreased strength, Increased muscle spasms, Impaired perceived functional ability, Improper body mechanics, Postural dysfunction, Pain  Visit Diagnosis: Cervicalgia  Cramp and spasm     Problem List Patient Active Problem List   Diagnosis Date Noted  . Vaginitis and vulvovaginitis 01/18/2016  . Preventative health care 01/18/2016  . Low back pain 01/18/2016  . History of chicken pox   . Hyperlipidemia 12/15/2014  . Allergic rhinitis 06/25/2014  . Allergy to bee sting 06/10/2014  . TMJ tenderness 11/16/2013  . Other malaise and fatigue 05/07/2013  . Trapezius muscle spasm 11/27/2012  . Parkinson's  disease ([place]) 05/24/2012  . General medical examination 11/24/2011  . HTN (hypertension) 08/31/2011  . Depression 08/31/2011  . Arthritis 08/31/2011  . Tremor 08/31/2011  . Benign hypertensive heart disease without heart failure 05/12/2011    Scot Jun, PTA 05/19/2016, 12:17 PM  [place], [place], [address] Phone: [phone]   Fax:  [phone]  Name: JONIECE BOKELMAN MRN: [id number] Date of Birth: [birth year]

## 2016-05-19 NOTE — Telephone Encounter (Signed)
Form forwarded to [place] for pt's appt. JG//CMA

## 2016-05-21 ENCOUNTER — Encounter: Payer: Self-pay | Admitting: Physical Therapy

## 2016-05-21 ENCOUNTER — Ambulatory Visit: Payer: Medicare Other | Admitting: Physical Therapy

## 2016-05-21 DIAGNOSIS — R252 Cramp and spasm: Secondary | ICD-10-CM | POA: Diagnosis not present

## 2016-05-21 DIAGNOSIS — M542 Cervicalgia: Secondary | ICD-10-CM

## 2016-05-21 NOTE — Therapy (Signed)
[place], [place], [address] Phone: [phone]   Fax:  [phone]  Physical Therapy Treatment  Patient Details  Name: Stephanie [name] MRN: [id number] Date of Birth: 04-08-1932 Referring Provider: Charlett Blake  Encounter Date: 05/21/2016      PT End of Session - 05/21/16 1039    Visit Number 9   Date for PT Re-Evaluation 06/22/16   PT Start Time 1010   PT Stop Time 1100   PT Time Calculation (min) 50 min      Past Medical History  Diagnosis Date  . Hypertension   . Situational stress   . Parkinson disease ([place]) 2012  . Vertigo     took antivert for a couple days  . OA (osteoarthritis)     [facility]  . Back pain     due to osteoarthritis  . Dry scalp   . History of chicken pox   . H/O measles   . H/O mumps   . Vaginitis and vulvovaginitis 01/18/2016  . Preventative health care 01/18/2016  . Low back pain 01/18/2016    Past Surgical History  Procedure Laterality Date  . Tonsillectomy and adenoidectomy    . Dilation and curettage of uterus    . Cystectomy      wrist  . Laparoscopy      secondary infertility   . Hand surgery Right     MCP joint for arthritis, prosthesis placed and removed  . Cataract extraction    . Hysteroscopy      resection of polyp  . Biopsy thyroid      benign  . Wrist cyst      right    There were no vitals filed for this visit.      Subjective Assessment - 05/21/16 1035    Subjective feeling better 50%, driving with better motion and sleeping without waking   Currently in Pain? Yes   Pain Score 5    Pain Location Neck            [facility] PT Assessment - 05/21/16 0001    AROM   Overall AROM Comments cderv ROM limited 25-50%                     [facility] Adult PT Treatment/Exercise - 05/21/16 0001    Moist Heat Therapy   Number Minutes Moist Heat 15 Minutes   Moist Heat Location Cervical   Ultrasound    Ultrasound Location cerv/trap   Ultrasound Parameters 1 mhz   Ultrasound Goals Pain  COMBO   Traction   Type of Traction Cervical   Min (lbs) 15   Time 15   Manual Therapy   Manual Therapy Soft tissue mobilization;Taping   Soft tissue mobilization cerv/trap/rhom                  PT Short Term Goals - 05/04/16 1039    PT SHORT TERM GOAL #1   Title independent with initialHEP   Status Achieved           PT Long Term Goals - 05/21/16 1038    PT LONG TERM GOAL #1   Title understand proper posture and body mechanics   Status Achieved   PT LONG TERM GOAL #2   Title increase cervical ROM 25%   Status Achieved   PT LONG TERM GOAL #3   Title decrease pain 25%  Status Achieved   PT LONG TERM GOAL #4   Title report 25% less difficulty with driving   Status Achieved   PT LONG TERM GOAL #5   Title report sleeping 25% better   Status Achieved               Plan - 05/21/16 1039    Clinical Impression Statement all current goals met. still ROM limited and tightness.   PT Next Visit Plan slowly add back in ther ex,cerv ROM. ADD GOALS as pt still has deficiets and see benefits of PT      Patient will benefit from skilled therapeutic intervention in order to improve the following deficits and impairments:     Visit Diagnosis: Cervicalgia  Cramp and spasm     Problem List Patient Active Problem List   Diagnosis Date Noted  . Vaginitis and vulvovaginitis 01/18/2016  . Preventative health care 01/18/2016  . Low back pain 01/18/2016  . History of chicken pox   . Hyperlipidemia 12/15/2014  . Allergic rhinitis 06/25/2014  . Allergy to bee sting 06/10/2014  . TMJ tenderness 11/16/2013  . Other malaise and fatigue 05/07/2013  . Trapezius muscle spasm 11/27/2012  . Parkinson's disease ([place]) 05/24/2012  . General medical examination 11/24/2011  . HTN (hypertension) 08/31/2011  . Depression 08/31/2011  . Arthritis 08/31/2011  . Tremor 08/31/2011  .  Benign hypertensive heart disease without heart failure 05/12/2011    Stormie Ventola,ANGIE PTA 05/21/2016, 10:46 AM  [place] [address] [place], [place], [address] Phone: [phone]   Fax:  [phone]  Name: BIRDELLA SIPPEL MRN: [id number] Date of Birth: Nov 05, 1932

## 2016-05-25 ENCOUNTER — Ambulatory Visit: Payer: Medicare Other | Admitting: Physical Therapy

## 2016-05-25 ENCOUNTER — Encounter: Payer: Self-pay | Admitting: Physical Therapy

## 2016-05-25 DIAGNOSIS — R252 Cramp and spasm: Secondary | ICD-10-CM | POA: Diagnosis not present

## 2016-05-25 DIAGNOSIS — M542 Cervicalgia: Secondary | ICD-10-CM | POA: Diagnosis not present

## 2016-05-25 NOTE — Therapy (Signed)
[place], [place], [address] Phone: [phone]   Fax:  [phone]  Physical Therapy Treatment  Patient Details  Name: Stephanie [name] MRN: [id number] Date of Birth: 23-Jan-1932 Referring Provider: Charlett Blake  Encounter Date: 05/25/2016      PT End of Session - 05/25/16 0923    Visit Number 10   Date for PT Re-Evaluation 06/22/16   PT Start Time 0840   PT Stop Time 0940   PT Time Calculation (min) 60 min      Past Medical History  Diagnosis Date  . Hypertension   . Situational stress   . Parkinson disease ([place]) 2012  . Vertigo     took antivert for a couple days  . OA (osteoarthritis)     [facility]  . Back pain     due to osteoarthritis  . Dry scalp   . History of chicken pox   . H/O measles   . H/O mumps   . Vaginitis and vulvovaginitis 01/18/2016  . Preventative health care 01/18/2016  . Low back pain 01/18/2016    Past Surgical History  Procedure Laterality Date  . Tonsillectomy and adenoidectomy    . Dilation and curettage of uterus    . Cystectomy      wrist  . Laparoscopy      secondary infertility   . Hand surgery Right     MCP joint for arthritis, prosthesis placed and removed  . Cataract extraction    . Hysteroscopy      resection of polyp  . Biopsy thyroid      benign  . Wrist cyst      right    There were no vitals filed for this visit.      Subjective Assessment - 05/25/16 0838    Subjective about the same   Currently in Pain? Yes   Pain Score 4    Pain Location Neck                         [facility] Adult PT Treatment/Exercise - 05/25/16 0001    Neck Exercises: Machines for Strengthening   UBE (Upper Arm Bike) L 2 2 fwd/2back   Cybex Row 20lb 2x10   Other Machines for Strengthening 2 sets 10 20#   Neck Exercises: Standing   Other Standing Exercises 4# shruggs and backward rolls 10 times   Neck  Exercises: Seated   Other Seated Exercise cerv retraction 10 times hold 3 sec   Other Seated Exercise yellow tband scap stab   Ultrasound   Ultrasound Location cerv/trap   Ultrasound Parameters 1 mhz 1.2 w/cm 2 100%    Ultrasound Goals Pain;Other (Comment)  tightness/trigger point   Traction   Type of Traction Cervical   Min (lbs) 15   Time 15   Manual Therapy   Manual Therapy Soft tissue mobilization   Manual therapy comments several deep trigger pts noted in trap and rhomboids                  PT Short Term Goals - 05/04/16 1039    PT SHORT TERM GOAL #1   Title independent with initialHEP   Status Achieved           PT Long Term Goals - 05/21/16 1038    PT LONG TERM GOAL #1   Title understand proper posture and body  mechanics   Status Achieved   PT LONG TERM GOAL #2   Title increase cervical ROM 25%   Status Achieved   PT LONG TERM GOAL #3   Title decrease pain 25%   Status Achieved   PT LONG TERM GOAL #4   Title report 25% less difficulty with driving   Status Achieved   PT LONG TERM GOAL #5   Title report sleeping 25% better   Status Achieved               Plan - 05/25/16 0924    Clinical Impression Statement tolerated ther ex interventions well. continues to respond to modalities. Stiffness on cerv and upper back still noted.   PT Next Visit Plan continue with addition of ther ex ,STW and modalities      Patient will benefit from skilled therapeutic intervention in order to improve the following deficits and impairments:  Abnormal gait, Decreased range of motion, Decreased strength, Increased muscle spasms, Impaired perceived functional ability, Improper body mechanics, Postural dysfunction, Pain  Visit Diagnosis: Cervicalgia  Cramp and spasm     Problem List Patient Active Problem List   Diagnosis Date Noted  . Vaginitis and vulvovaginitis 01/18/2016  . Preventative health care 01/18/2016  . Low back pain 01/18/2016  .  History of chicken pox   . Hyperlipidemia 12/15/2014  . Allergic rhinitis 06/25/2014  . Allergy to bee sting 06/10/2014  . TMJ tenderness 11/16/2013  . Other malaise and fatigue 05/07/2013  . Trapezius muscle spasm 11/27/2012  . Parkinson's disease ([place]) 05/24/2012  . General medical examination 11/24/2011  . HTN (hypertension) 08/31/2011  . Depression 08/31/2011  . Arthritis 08/31/2011  . Tremor 08/31/2011  . Benign hypertensive heart disease without heart failure 05/12/2011    Talton Delpriore,ANGIE PTA 05/25/2016, 9:27 AM  [place], [place], [address] Phone: [phone]   Fax:  [phone]  Name: Stephanie [name] MRN: [id number] Date of Birth: [birth year]

## 2016-05-27 ENCOUNTER — Encounter: Payer: Self-pay | Admitting: Physical Therapy

## 2016-05-27 ENCOUNTER — Ambulatory Visit: Payer: Medicare Other | Admitting: Physical Therapy

## 2016-05-27 DIAGNOSIS — R252 Cramp and spasm: Secondary | ICD-10-CM | POA: Diagnosis not present

## 2016-05-27 DIAGNOSIS — M542 Cervicalgia: Secondary | ICD-10-CM | POA: Diagnosis not present

## 2016-05-27 NOTE — Therapy (Signed)
[place], [place], [address] Phone: [phone]   Fax:  [phone]  Physical Therapy Treatment  Patient Details  Name: Stephanie [name] MRN: [id number] Date of Birth: 14-Jan-1932 Referring Provider: Charlett Blake  Encounter Date: 05/27/2016      PT End of Session - 05/27/16 1227    Visit Number 11   Date for PT Re-Evaluation 06/22/16   PT Start Time [id number]   PT Stop Time 1234   PT Time Calculation (min) 49 min   Activity Tolerance Patient tolerated treatment well   Behavior During Therapy [facility] for tasks assessed/performed      Past Medical History  Diagnosis Date  . Hypertension   . Situational stress   . Parkinson disease ([place]) 2012  . Vertigo     took antivert for a couple days  . OA (osteoarthritis)     [facility]  . Back pain     due to osteoarthritis  . Dry scalp   . History of chicken pox   . H/O measles   . H/O mumps   . Vaginitis and vulvovaginitis 01/18/2016  . Preventative health care 01/18/2016  . Low back pain 01/18/2016    Past Surgical History  Procedure Laterality Date  . Tonsillectomy and adenoidectomy    . Dilation and curettage of uterus    . Cystectomy      wrist  . Laparoscopy      secondary infertility   . Hand surgery Right     MCP joint for arthritis, prosthesis placed and removed  . Cataract extraction    . Hysteroscopy      resection of polyp  . Biopsy thyroid      benign  . Wrist cyst      right    There were no vitals filed for this visit.      Subjective Assessment - 05/27/16 1146    Subjective "I am exhausted from not getting enough sleep. The pain from my hip is unbearable."   Currently in Pain? Yes   Pain Score 3    Pain Location Neck                         [facility] Adult PT Treatment/Exercise - 05/27/16 0001    Neck Exercises: Machines for Strengthening   UBE (Upper Arm Bike) L 1.5 3 fwd/2back    Cybex Row 20lb 2x10   Other Machines for Strengthening 2 sets 10 20#   Neck Exercises: Standing   Other Standing Exercises 4# shruggs and backward rolls 10 times   Other Standing Exercises Shrugs Red Tband 2x15   Neck Exercises: Seated   Other Seated Exercise cerv retraction 10 times hold 3 sec   Moist Heat Therapy   Number Minutes Moist Heat 10 Minutes   Moist Heat Location Cervical   Ultrasound   Ultrasound Location cerv/trap   Ultrasound Parameters 1MHz 1.2w/cm2   Ultrasound Goals Pain;Other (Comment)   Traction   Type of Traction Cervical   Min (lbs) 10   Time 10                  PT Short Term Goals - 05/04/16 1039    PT SHORT TERM GOAL #1   Title independent with initialHEP   Status Achieved           PT Long Term Goals - 05/25/16 1029  Additional Long Term Goals   Additional Long Term Goals Yes   PT LONG TERM GOAL #6   Title independent with advanced HEP for strength to care for husband   Time 6   Period Weeks   Status New   PT LONG TERM GOAL #7   Title care for husband with 50% less pain   Time 6   Period Weeks   Status New   PT LONG TERM GOAL #8   Title increase cervical ROM 25%   Time 6   Period Weeks   Status New               Plan - 05/27/16 1228    Clinical Impression Statement Again tolerated exercises well, Pt reports more issues with her hip than neck. stiffness in upper back and neck remains.   PT Frequency 2x / week   PT Duration 8 weeks   PT Treatment/Interventions ADLs/Self Care Home Management;Cryotherapy;Electrical Stimulation;Moist Heat;Therapeutic exercise;Therapeutic activities;Ultrasound;Traction;Patient/family education;Manual techniques   PT Next Visit Plan continue with addition of ther ex ,STW and modalities      Patient will benefit from skilled therapeutic intervention in order to improve the following deficits and impairments:  Abnormal gait, Decreased range of motion, Decreased strength, Increased  muscle spasms, Impaired perceived functional ability, Improper body mechanics, Postural dysfunction, Pain  Visit Diagnosis: Cervicalgia  Cramp and spasm     Problem List Patient Active Problem List   Diagnosis Date Noted  . Vaginitis and vulvovaginitis 01/18/2016  . Preventative health care 01/18/2016  . Low back pain 01/18/2016  . History of chicken pox   . Hyperlipidemia 12/15/2014  . Allergic rhinitis 06/25/2014  . Allergy to bee sting 06/10/2014  . TMJ tenderness 11/16/2013  . Other malaise and fatigue 05/07/2013  . Trapezius muscle spasm 11/27/2012  . Parkinson's disease ([place]) 05/24/2012  . General medical examination 11/24/2011  . HTN (hypertension) 08/31/2011  . Depression 08/31/2011  . Arthritis 08/31/2011  . Tremor 08/31/2011  . Benign hypertensive heart disease without heart failure 05/12/2011    Scot Jun, PTA  05/27/2016, 12:32 PM  [place], [place], [address] Phone: [phone]   Fax:  [phone]  Name: Stephanie [name] MRN: [id number] Date of Birth: 07-Nov-1932

## 2016-06-01 ENCOUNTER — Telehealth: Payer: Self-pay | Admitting: Family Medicine

## 2016-06-01 ENCOUNTER — Encounter: Payer: Self-pay | Admitting: Physical Therapy

## 2016-06-01 ENCOUNTER — Ambulatory Visit: Payer: Medicare Other | Admitting: Physical Therapy

## 2016-06-01 DIAGNOSIS — M542 Cervicalgia: Secondary | ICD-10-CM | POA: Diagnosis not present

## 2016-06-01 DIAGNOSIS — R252 Cramp and spasm: Secondary | ICD-10-CM | POA: Diagnosis not present

## 2016-06-01 NOTE — Telephone Encounter (Signed)
Pt dropped off copies of Power of Attorney (yellow big envelope) with also a letter for provider to read, also in yellow envelope is her spouse Power of Attorney (noted on pt's chart)

## 2016-06-01 NOTE — Therapy (Signed)
[place], [place], [address] Phone: [phone]   Fax:  [phone]  Physical Therapy Treatment  Patient Details  Name: Stephanie [name] MRN: [id number] Date of Birth: 1931-12-23 Referring Provider: Charlett Blake  Encounter Date: 06/01/2016      PT End of Session - 06/01/16 1220    Visit Number 12   Date for PT Re-Evaluation 06/22/16   PT Start Time 1140   PT Stop Time 1238   PT Time Calculation (min) 58 min      Past Medical History  Diagnosis Date  . Hypertension   . Situational stress   . Parkinson disease ([place]) 2012  . Vertigo     took antivert for a couple days  . OA (osteoarthritis)     [facility]  . Back pain     due to osteoarthritis  . Dry scalp   . History of chicken pox   . H/O measles   . H/O mumps   . Vaginitis and vulvovaginitis 01/18/2016  . Preventative health care 01/18/2016  . Low back pain 01/18/2016    Past Surgical History  Procedure Laterality Date  . Tonsillectomy and adenoidectomy    . Dilation and curettage of uterus    . Cystectomy      wrist  . Laparoscopy      secondary infertility   . Hand surgery Right     MCP joint for arthritis, prosthesis placed and removed  . Cataract extraction    . Hysteroscopy      resection of polyp  . Biopsy thyroid      benign  . Wrist cyst      right    There were no vitals filed for this visit.      Subjective Assessment - 06/01/16 1148    Subjective stress of husband being on Hospice and me needing hip replaced is making neck tight   Currently in Pain? Yes   Pain Score 4    Pain Location Neck                         [facility] Adult PT Treatment/Exercise - 06/01/16 0001    Neck Exercises: Machines for Strengthening   UBE (Upper Arm Bike) L 1.5 3 fwd/2back   Other Machines for Strengthening Lat pull and seated row 2 sets 15 20#   Neck Exercises: Standing   Other  Standing Exercises ball vs wall 10 times   Other Standing Exercises 3# flex, chest press, abd,PNF 10 times each   Moist Heat Therapy   Number Minutes Moist Heat 10 Minutes   Moist Heat Location Cervical   Ultrasound   Ultrasound Location cerv/trap   Ultrasound Parameters COMBO   Traction   Type of Traction Cervical   Min (lbs) 15   Time 15   Manual Therapy   Manual Therapy Soft tissue mobilization                  PT Short Term Goals - 05/04/16 1039    PT SHORT TERM GOAL #1   Title independent with initialHEP   Status Achieved           PT Long Term Goals - 05/25/16 1029    Additional Long Term Goals   Additional Long Term Goals Yes   PT LONG TERM GOAL #6   Title independent with advanced HEP for strength  to care for husband   Time 6   Period Weeks   Status New   PT LONG TERM GOAL #7   Title care for husband with 50% less pain   Time 6   Period Weeks   Status New   PT LONG TERM GOAL #8   Title increase cervical ROM 25%   Time 6   Period Weeks   Status New               Plan - 06/01/16 1220    Clinical Impression Statement ther interventions were tolerated well. focus on strength for shld and cerv muscles to support neck and increase abillity to care for husband   PT Next Visit Plan continue with addition of ther ex ,STW and modalities      Patient will benefit from skilled therapeutic intervention in order to improve the following deficits and impairments:  Abnormal gait, Decreased range of motion, Decreased strength, Increased muscle spasms, Impaired perceived functional ability, Improper body mechanics, Postural dysfunction, Pain  Visit Diagnosis: Cervicalgia     Problem List Patient Active Problem List   Diagnosis Date Noted  . Vaginitis and vulvovaginitis 01/18/2016  . Preventative health care 01/18/2016  . Low back pain 01/18/2016  . History of chicken pox   . Hyperlipidemia 12/15/2014  . Allergic rhinitis 06/25/2014  .  Allergy to bee sting 06/10/2014  . TMJ tenderness 11/16/2013  . Other malaise and fatigue 05/07/2013  . Trapezius muscle spasm 11/27/2012  . Parkinson's disease ([place]) 05/24/2012  . General medical examination 11/24/2011  . HTN (hypertension) 08/31/2011  . Depression 08/31/2011  . Arthritis 08/31/2011  . Tremor 08/31/2011  . Benign hypertensive heart disease without heart failure 05/12/2011    Mattias Walmsley,ANGIE PTA 06/01/2016, 12:21 PM  [place], [place], [address] Phone: [phone]   Fax:  [phone]  Name: CHANTELLE PILLADO MRN: [id number] Date of Birth: 1932-03-21

## 2016-06-03 ENCOUNTER — Ambulatory Visit: Payer: Medicare Other | Admitting: Physical Therapy

## 2016-06-03 ENCOUNTER — Encounter: Payer: Self-pay | Admitting: Physical Therapy

## 2016-06-03 DIAGNOSIS — R252 Cramp and spasm: Secondary | ICD-10-CM

## 2016-06-03 DIAGNOSIS — M542 Cervicalgia: Secondary | ICD-10-CM | POA: Diagnosis not present

## 2016-06-03 NOTE — Therapy (Signed)
[place], [place], [address] Phone: [phone]   Fax:  [phone]  Physical Therapy Treatment  Patient Details  Name: Stephanie [name] MRN: [id number] Date of Birth: 1932-04-06 Referring Provider: Charlett Blake  Encounter Date: 06/03/2016      PT End of Session - 06/03/16 1159    Visit Number 13   Date for PT Re-Evaluation 06/22/16   PT Start Time 1135   PT Stop Time 1220   PT Time Calculation (min) 45 min      Past Medical History  Diagnosis Date  . Hypertension   . Situational stress   . Parkinson disease ([place]) 2012  . Vertigo     took antivert for a couple days  . OA (osteoarthritis)     [facility]  . Back pain     due to osteoarthritis  . Dry scalp   . History of chicken pox   . H/O measles   . H/O mumps   . Vaginitis and vulvovaginitis 01/18/2016  . Preventative health care 01/18/2016  . Low back pain 01/18/2016    Past Surgical History  Procedure Laterality Date  . Tonsillectomy and adenoidectomy    . Dilation and curettage of uterus    . Cystectomy      wrist  . Laparoscopy      secondary infertility   . Hand surgery Right     MCP joint for arthritis, prosthesis placed and removed  . Cataract extraction    . Hysteroscopy      resection of polyp  . Biopsy thyroid      benign  . Wrist cyst      right    There were no vitals filed for this visit.      Subjective Assessment - 06/03/16 1157    Subjective ex last session increased pain ,shooting up RT side of neck   Currently in Pain? Yes   Pain Score 7    Pain Location Neck                         [facility] Adult PT Treatment/Exercise - 06/03/16 0001    Moist Heat Therapy   Number Minutes Moist Heat 15 Minutes   Moist Heat Location Cervical   Ultrasound   Ultrasound Location cerv/trap   Ultrasound Parameters COMBO   Traction   Type of Traction Cervical   Min (lbs)  15   Time 15   Manual Therapy   Manual Therapy Soft tissue mobilization   Manual therapy comments cerv/trap/rhomboids                  PT Short Term Goals - 05/04/16 1039    PT SHORT TERM GOAL #1   Title independent with initialHEP   Status Achieved           PT Long Term Goals - 05/25/16 1029    Additional Long Term Goals   Additional Long Term Goals Yes   PT LONG TERM GOAL #6   Title independent with advanced HEP for strength to care for husband   Time 6   Period Weeks   Status New   PT LONG TERM GOAL #7   Title care for husband with 50% less pain   Time 6   Period Weeks   Status New   PT LONG TERM GOAL #8   Title increase cervical ROM  25%   Time 6   Period Weeks   Status New               Plan - 06/03/16 1200    Clinical Impression Statement pt with increased pain on RT side on neck after ther ex last session ( possibly free wt ex). Responded well to modalities today and good pain relief.   PT Next Visit Plan slow with ther ex progression      Patient will benefit from skilled therapeutic intervention in order to improve the following deficits and impairments:  Abnormal gait, Decreased range of motion, Decreased strength, Increased muscle spasms, Impaired perceived functional ability, Improper body mechanics, Postural dysfunction, Pain  Visit Diagnosis: Cervicalgia  Cramp and spasm     Problem List Patient Active Problem List   Diagnosis Date Noted  . Vaginitis and vulvovaginitis 01/18/2016  . Preventative health care 01/18/2016  . Low back pain 01/18/2016  . History of chicken pox   . Hyperlipidemia 12/15/2014  . Allergic rhinitis 06/25/2014  . Allergy to bee sting 06/10/2014  . TMJ tenderness 11/16/2013  . Other malaise and fatigue 05/07/2013  . Trapezius muscle spasm 11/27/2012  . Parkinson's disease ([place]) 05/24/2012  . General medical examination 11/24/2011  . HTN (hypertension) 08/31/2011  . Depression 08/31/2011  .  Arthritis 08/31/2011  . Tremor 08/31/2011  . Benign hypertensive heart disease without heart failure 05/12/2011    PAYSEUR,ANGIE PTA 06/03/2016, 12:02 PM  [place], [place], [address] Phone: [phone]   Fax:  [phone]  Name: Stephanie [name] MRN: [id number] Date of Birth: Nov 16, 1931

## 2016-06-04 ENCOUNTER — Telehealth: Payer: Self-pay | Admitting: Family Medicine

## 2016-06-04 MED ORDER — HYDROCODONE-ACETAMINOPHEN 5-325 MG PO TABS: ORAL_TABLET | ORAL | Status: DC

## 2016-06-04 NOTE — Telephone Encounter (Signed)
°  Relationship to patient: Self  Can be reached: [phone]    Reason for call: Patient is having problems sleeping because of the pain in her hip and is requesting that Provider call surgeon to have surgery date moved closer.

## 2016-06-04 NOTE — Telephone Encounter (Signed)
Printed prescription as informed by PCP. Called the patient, but line still busy.

## 2016-06-04 NOTE — Telephone Encounter (Signed)
I am afraid she will need to reach out to surgeon I cannot control their schedule but with her increased pain they might be able to move her. She should also try the Lidocaine patches qhs and I am willing to write her for a new pain med qhs to get her sleeping better

## 2016-06-04 NOTE — Telephone Encounter (Signed)
Called the patient informed to pickup hardcopy at the front desk. 

## 2016-06-04 NOTE — Telephone Encounter (Signed)
Called the patient line busy.

## 2016-06-04 NOTE — Addendum Note (Signed)
Addended by: Sharon Seller B on: 06/04/2016 02:18 PM   Modules accepted: Orders, Medications

## 2016-06-04 NOTE — Telephone Encounter (Signed)
I am will to write her for just a few Norco 5/325 1 to 2 qhs prn pain, disp #30 to see if she can get some rest until her surgery

## 2016-06-04 NOTE — Telephone Encounter (Signed)
Patient was informed of PCP instructions. She states her Ortho. Prescribed tramadol 50 mg for pain, but it caused her BP to go up and she had a bad headache, but no relief from the pain.  She was advised to contact her ortho as PCP informed and also to inform them of her response to the tramadol since they prescribed it.

## 2016-06-08 ENCOUNTER — Encounter: Payer: Self-pay | Admitting: Physical Therapy

## 2016-06-08 ENCOUNTER — Ambulatory Visit: Payer: Medicare Other | Admitting: Physical Therapy

## 2016-06-08 DIAGNOSIS — R252 Cramp and spasm: Secondary | ICD-10-CM | POA: Diagnosis not present

## 2016-06-08 DIAGNOSIS — M542 Cervicalgia: Secondary | ICD-10-CM | POA: Diagnosis not present

## 2016-06-08 NOTE — Therapy (Signed)
[place], [place], [address] Phone: [phone]   Fax:  [phone]  Physical Therapy Treatment  Patient Details  Name: Stephanie [name] MRN: [id number] Date of Birth: [birth year] Referring Provider: Charlett Blake  Encounter Date: 06/08/2016      PT End of Session - 06/08/16 1205    Visit Number 14   Date for PT Re-Evaluation 06/22/16   PT Start Time 1140   PT Stop Time 1218   PT Time Calculation (min) 38 min   Activity Tolerance Patient tolerated treatment well   Behavior During Therapy [facility] for tasks assessed/performed      Past Medical History:  Diagnosis Date  . Back pain    due to osteoarthritis  . Dry scalp   . H/O measles   . H/O mumps   . History of chicken pox   . Hypertension   . Low back pain 01/18/2016  . OA (osteoarthritis)    [facility]  . Parkinson disease ([place]) 2012  . Preventative health care 01/18/2016  . Situational stress   . Vaginitis and vulvovaginitis 01/18/2016  . Vertigo    took antivert for a couple days    Past Surgical History:  Procedure Laterality Date  . BIOPSY THYROID     benign  . CATARACT EXTRACTION    . CYSTECTOMY     wrist  . DILATION AND CURETTAGE OF UTERUS    . HAND SURGERY Right    MCP joint for arthritis, prosthesis placed and removed  . HYSTEROSCOPY     resection of polyp  . LAPAROSCOPY     secondary infertility   . TONSILLECTOMY AND ADENOIDECTOMY    . wrist cyst     right    There were no vitals filed for this visit.      Subjective Assessment - 06/08/16 1140    Subjective "You know my neck was hurting more last time." "Its better today but it hurts more than it did"   Currently in Pain? Yes   Pain Score 4    Pain Location Neck                         [facility] Adult PT Treatment/Exercise - 06/08/16 0001      Moist Heat Therapy   Number Minutes Moist Heat 15 Minutes   Moist Heat  Location Cervical     Ultrasound   Ultrasound Location cerv/trap   Ultrasound Parameters COMBO     Traction   Type of Traction Cervical   Time 15                  PT Short Term Goals - 05/04/16 1039      PT SHORT TERM GOAL #1   Title independent with initialHEP   Status Achieved           PT Long Term Goals - 06/08/16 1142      PT LONG TERM GOAL #6   Title independent with advanced HEP for strength to care for husband   Status On-going     PT LONG TERM GOAL #7   Title care for husband with 50% less pain   Status On-going               Plan - 06/08/16 1205    Clinical Impression Statement Pt continues to have increase RT side neck pain but a little  less than last time. Good relief form modality   Rehab Potential Good   PT Frequency 2x / week   PT Duration 8 weeks   PT Treatment/Interventions ADLs/Self Care Home Management;Cryotherapy;Electrical Stimulation;Moist Heat;Therapeutic exercise;Therapeutic activities;Ultrasound;Traction;Patient/family education;Manual techniques   PT Next Visit Plan slow with ther ex progression      Patient will benefit from skilled therapeutic intervention in order to improve the following deficits and impairments:  Abnormal gait, Decreased range of motion, Decreased strength, Increased muscle spasms, Impaired perceived functional ability, Improper body mechanics, Postural dysfunction, Pain  Visit Diagnosis: Cervicalgia  Cramp and spasm     Problem List Patient Active Problem List   Diagnosis Date Noted  . Vaginitis and vulvovaginitis 01/18/2016  . Preventative health care 01/18/2016  . Low back pain 01/18/2016  . History of chicken pox   . Hyperlipidemia 12/15/2014  . Allergic rhinitis 06/25/2014  . Allergy to bee sting 06/10/2014  . TMJ tenderness 11/16/2013  . Other malaise and fatigue 05/07/2013  . Trapezius muscle spasm 11/27/2012  . Parkinson's disease ([place]) 05/24/2012  . General medical  examination 11/24/2011  . HTN (hypertension) 08/31/2011  . Depression 08/31/2011  . Arthritis 08/31/2011  . Tremor 08/31/2011  . Benign hypertensive heart disease without heart failure 05/12/2011    Scot Jun 06/08/2016, 12:08 PM  [place], [place], [address] Phone: [phone]   Fax:  [phone]  Name: Stephanie [name] MRN: [id number] Date of Birth: 1932-03-15

## 2016-06-09 ENCOUNTER — Other Ambulatory Visit: Payer: Self-pay | Admitting: Family Medicine

## 2016-06-10 ENCOUNTER — Ambulatory Visit: Payer: Medicare Other | Admitting: Physical Therapy

## 2016-06-10 ENCOUNTER — Encounter: Payer: Self-pay | Admitting: Physical Therapy

## 2016-06-10 DIAGNOSIS — R252 Cramp and spasm: Secondary | ICD-10-CM | POA: Diagnosis not present

## 2016-06-10 DIAGNOSIS — M542 Cervicalgia: Secondary | ICD-10-CM | POA: Diagnosis not present

## 2016-06-10 NOTE — Therapy (Signed)
[place], [place], [address] Phone: [phone]   Fax:  [phone]  Physical Therapy Treatment  Patient Details  Name: Stephanie [name] MRN: [id number] Date of Birth: 08/23/1932 Referring Provider: Charlett Blake  Encounter Date: 06/10/2016      PT End of Session - 06/10/16 1204    Visit Number 15   Date for PT Re-Evaluation 06/22/16   PT Start Time 1130   PT Stop Time 1214   PT Time Calculation (min) 44 min   Activity Tolerance Patient tolerated treatment well   Behavior During Therapy [facility] for tasks assessed/performed      Past Medical History:  Diagnosis Date  . Back pain    due to osteoarthritis  . Dry scalp   . H/O measles   . H/O mumps   . History of chicken pox   . Hypertension   . Low back pain 01/18/2016  . OA (osteoarthritis)    [facility]  . Parkinson disease ([place]) 2012  . Preventative health care 01/18/2016  . Situational stress   . Vaginitis and vulvovaginitis 01/18/2016  . Vertigo    took antivert for a couple days    Past Surgical History:  Procedure Laterality Date  . BIOPSY THYROID     benign  . CATARACT EXTRACTION    . CYSTECTOMY     wrist  . DILATION AND CURETTAGE OF UTERUS    . HAND SURGERY Right    MCP joint for arthritis, prosthesis placed and removed  . HYSTEROSCOPY     resection of polyp  . LAPAROSCOPY     secondary infertility   . TONSILLECTOMY AND ADENOIDECTOMY    . wrist cyst     right    There were no vitals filed for this visit.      Subjective Assessment - 06/10/16 1130    Subjective "You know its not feeling any better, Its sad because was doing good"   Currently in Pain? Yes   Pain Score 4    Pain Location Neck   Pain Orientation Right                         [facility] Adult PT Treatment/Exercise - 06/10/16 0001      Moist Heat Therapy   Number Minutes Moist Heat 15 Minutes   Moist Heat  Location Cervical     Ultrasound   Ultrasound Location cerv/trap   Ultrasound Parameters 1MHz 1.2 w/cm2     Traction   Type of Traction Cervical   Max (lbs) 15   Time 15     Manual Therapy   Manual Therapy Soft tissue mobilization   Manual therapy comments cerv/trap/   Passive ROM Some contract relax to increase L cervi rotation.                  PT Short Term Goals - 05/04/16 1039      PT SHORT TERM GOAL #1   Title independent with initialHEP   Status Achieved           PT Long Term Goals - 06/08/16 1142      PT LONG TERM GOAL #6   Title independent with advanced HEP for strength to care for husband   Status On-going     PT LONG TERM GOAL #7   Title care for husband with 50% less pain   Status  On-going               Plan - 06/10/16 1205    Clinical Impression Statement R side neck remains. Tenderness with deep palpation. Uses [place] with a smaller head to focus treatment more on painful area. Tolerated all of today's interventions well.   Rehab Potential Good   PT Frequency 2x / week   PT Duration 8 weeks   PT Treatment/Interventions ADLs/Self Care Home Management;Cryotherapy;Electrical Stimulation;Moist Heat;Therapeutic exercise;Therapeutic activities;Ultrasound;Traction;Patient/family education;Manual techniques   PT Next Visit Plan slow with ther ex progression, assess tx      Patient will benefit from skilled therapeutic intervention in order to improve the following deficits and impairments:  Abnormal gait, Decreased range of motion, Decreased strength, Increased muscle spasms, Impaired perceived functional ability, Improper body mechanics, Postural dysfunction, Pain  Visit Diagnosis: Cervicalgia  Cramp and spasm     Problem List Patient Active Problem List   Diagnosis Date Noted  . Vaginitis and vulvovaginitis 01/18/2016  . Preventative health care 01/18/2016  . Low back pain 01/18/2016  . History of chicken pox   .  Hyperlipidemia 12/15/2014  . Allergic rhinitis 06/25/2014  . Allergy to bee sting 06/10/2014  . TMJ tenderness 11/16/2013  . Other malaise and fatigue 05/07/2013  . Trapezius muscle spasm 11/27/2012  . Parkinson's disease ([place]) 05/24/2012  . General medical examination 11/24/2011  . HTN (hypertension) 08/31/2011  . Depression 08/31/2011  . Arthritis 08/31/2011  . Tremor 08/31/2011  . Benign hypertensive heart disease without heart failure 05/12/2011    Scot Jun, PTA 06/10/2016, 12:09 PM  [place], [place], [address] Phone: [phone]   Fax:  [phone]  Name: Stephanie [name] MRN: [id number] Date of Birth: 11/05/1932

## 2016-06-15 ENCOUNTER — Encounter: Payer: Self-pay | Admitting: Physical Therapy

## 2016-06-15 ENCOUNTER — Ambulatory Visit: Payer: Medicare Other | Attending: Family Medicine | Admitting: Physical Therapy

## 2016-06-15 DIAGNOSIS — R252 Cramp and spasm: Secondary | ICD-10-CM | POA: Insufficient documentation

## 2016-06-15 DIAGNOSIS — M542 Cervicalgia: Secondary | ICD-10-CM | POA: Insufficient documentation

## 2016-06-15 NOTE — Therapy (Signed)
[place], [place], [address] Phone: [phone]   Fax:  [phone]  Physical Therapy Treatment  Patient Details  Name: Stephanie [name] MRN: [id number] Date of Birth: [birth year] Referring Provider: Charlett Blake  Encounter Date: 06/15/2016      PT End of Session - 06/15/16 1159    Visit Number 16   Date for PT Re-Evaluation 06/22/16   PT Start Time 1130   PT Stop Time 1220   PT Time Calculation (min) 50 min      Past Medical History:  Diagnosis Date  . Back pain    due to osteoarthritis  . Dry scalp   . H/O measles   . H/O mumps   . History of chicken pox   . Hypertension   . Low back pain 01/18/2016  . OA (osteoarthritis)    [facility]  . Parkinson disease ([place]) 2012  . Preventative health care 01/18/2016  . Situational stress   . Vaginitis and vulvovaginitis 01/18/2016  . Vertigo    took antivert for a couple days    Past Surgical History:  Procedure Laterality Date  . BIOPSY THYROID     benign  . CATARACT EXTRACTION    . CYSTECTOMY     wrist  . DILATION AND CURETTAGE OF UTERUS    . HAND SURGERY Right    MCP joint for arthritis, prosthesis placed and removed  . HYSTEROSCOPY     resection of polyp  . LAPAROSCOPY     secondary infertility   . TONSILLECTOMY AND ADENOIDECTOMY    . wrist cyst     right    There were no vitals filed for this visit.      Subjective Assessment - 06/15/16 1130    Subjective neck still hurting, slept last night for 4 hours withno heat pad for first time in a week   Currently in Pain? Yes   Pain Score 5    Pain Location Neck   Pain Orientation Right                         [facility] Adult PT Treatment/Exercise - 06/15/16 0001      Moist Heat Therapy   Number Minutes Moist Heat 15 Minutes   Moist Heat Location Cervical     Electrical Stimulation   Electrical Stimulation Location cerv/trap   Electrical Stimulation Action IFC   Electrical Stimulation Goals Pain     Manual Therapy   Manual Therapy Soft tissue mobilization   Manual therapy comments cerv/trap/rhomboid                  PT Short Term Goals - 05/04/16 1039      PT SHORT TERM GOAL #1   Title independent with initialHEP   Status Achieved           PT Long Term Goals - 06/08/16 1142      PT LONG TERM GOAL #6   Title independent with advanced HEP for strength to care for husband   Status On-going     PT LONG TERM GOAL #7   Title care for husband with 50% less pain   Status On-going               Plan - 06/15/16 1200    Clinical Impression Statement pt with increased pain and tightness RT cerv worse than left. tolerated MT  fair, very tender and painful. Pt has THA schedueld in 1 month and caring for sick husband which is not helping symptoms. and pian in neck.   PT Next Visit Plan D/C      Patient will benefit from skilled therapeutic intervention in order to improve the following deficits and impairments:  Abnormal gait, Decreased range of motion, Decreased strength, Increased muscle spasms, Impaired perceived functional ability, Improper body mechanics, Postural dysfunction, Pain  Visit Diagnosis: Cervicalgia  Cramp and spasm     Problem List Patient Active Problem List   Diagnosis Date Noted  . Vaginitis and vulvovaginitis 01/18/2016  . Preventative health care 01/18/2016  . Low back pain 01/18/2016  . History of chicken pox   . Hyperlipidemia 12/15/2014  . Allergic rhinitis 06/25/2014  . Allergy to bee sting 06/10/2014  . TMJ tenderness 11/16/2013  . Other malaise and fatigue 05/07/2013  . Trapezius muscle spasm 11/27/2012  . Parkinson's disease ([place]) 05/24/2012  . General medical examination 11/24/2011  . HTN (hypertension) 08/31/2011  . Depression 08/31/2011  . Arthritis 08/31/2011  . Tremor 08/31/2011  . Benign hypertensive heart disease without heart failure  05/12/2011    PAYSEUR,ANGIE PTA 06/15/2016, 12:02 PM  [place], [place], [address] Phone: [phone]   Fax:  [phone]  Name: Stephanie [name] MRN: [id number] Date of Birth: [birth year]

## 2016-06-16 DIAGNOSIS — G20 Parkinson's disease: Secondary | ICD-10-CM | POA: Diagnosis not present

## 2016-06-17 ENCOUNTER — Ambulatory Visit: Payer: Medicare Other | Admitting: Physical Therapy

## 2016-06-17 ENCOUNTER — Encounter: Payer: Self-pay | Admitting: Physical Therapy

## 2016-06-17 DIAGNOSIS — M542 Cervicalgia: Secondary | ICD-10-CM

## 2016-06-17 DIAGNOSIS — R252 Cramp and spasm: Secondary | ICD-10-CM

## 2016-06-17 NOTE — Therapy (Signed)
[place], [place], [address] Phone: [phone]   Fax:  [phone]  Physical Therapy Treatment  Patient Details  Name: Stephanie [name] MRN: [id number] Date of Birth: June 20, 1932 Referring Provider: Charlett Blake  Encounter Date: 06/17/2016      PT End of Session - 06/17/16 1159    Visit Number 17   Date for PT Re-Evaluation 06/22/16   PT Start Time 1130   PT Stop Time 1212   PT Time Calculation (min) 42 min      Past Medical History:  Diagnosis Date  . Back pain    due to osteoarthritis  . Dry scalp   . H/O measles   . H/O mumps   . History of chicken pox   . Hypertension   . Low back pain 01/18/2016  . OA (osteoarthritis)    [facility]  . Parkinson disease ([place]) 2012  . Preventative health care 01/18/2016  . Situational stress   . Vaginitis and vulvovaginitis 01/18/2016  . Vertigo    took antivert for a couple days    Past Surgical History:  Procedure Laterality Date  . BIOPSY THYROID     benign  . CATARACT EXTRACTION    . CYSTECTOMY     wrist  . DILATION AND CURETTAGE OF UTERUS    . HAND SURGERY Right    MCP joint for arthritis, prosthesis placed and removed  . HYSTEROSCOPY     resection of polyp  . LAPAROSCOPY     secondary infertility   . TONSILLECTOMY AND ADENOIDECTOMY    . wrist cyst     right    There were no vitals filed for this visit.      Subjective Assessment - 06/17/16 1132    Subjective fely better temp after last session   Currently in Pain? Yes   Pain Score 4    Pain Location Neck                         [facility] Adult PT Treatment/Exercise - 06/17/16 0001      Moist Heat Therapy   Number Minutes Moist Heat 15 Minutes   Moist Heat Location Cervical     Electrical Stimulation   Electrical Stimulation Location cerv/trap   Electrical Stimulation Action IFC     Manual Therapy   Manual Therapy Soft tissue  mobilization   Manual therapy comments cerv/trap/rhomboid                  PT Short Term Goals - 05/04/16 1039      PT SHORT TERM GOAL #1   Title independent with initialHEP   Status Achieved           PT Long Term Goals - 06/17/16 1200      PT LONG TERM GOAL #1   Title understand proper posture and body mechanics   Status Achieved     PT LONG TERM GOAL #2   Title increase cervical ROM 25%   Status Achieved     PT LONG TERM GOAL #3   Title decrease pain 25%   Status Achieved     PT LONG TERM GOAL #4   Title report 25% less difficulty with driving   Status Achieved     PT LONG TERM GOAL #5   Title report sleeping 25% better   Status Achieved     PT  LONG TERM GOAL #6   Title independent with advanced HEP for strength to care for husband   Status Achieved     PT LONG TERM GOAL #7   Title care for husband with 50% less pain   Status On-going               Plan - 06/17/16 1201    Clinical Impression Statement pt has done well overall with PT d/t increased care of husband and THA for herself scheduled for 8/23 she has not made gains in last 2 weeks, increased stressed with increased muscles tightness and decreased ROM.   PT Next Visit Plan D/C. focus on THA recovery and may need add'l PT services in the future.      Patient will benefit from skilled therapeutic intervention in order to improve the following deficits and impairments:  Abnormal gait, Decreased range of motion, Decreased strength, Increased muscle spasms, Impaired perceived functional ability, Improper body mechanics, Postural dysfunction, Pain  Visit Diagnosis: Cervicalgia  Cramp and spasm     Problem List Patient Active Problem List   Diagnosis Date Noted  . Vaginitis and vulvovaginitis 01/18/2016  . Preventative health care 01/18/2016  . Low back pain 01/18/2016  . History of chicken pox   . Hyperlipidemia 12/15/2014  . Allergic rhinitis 06/25/2014  . Allergy to bee  sting 06/10/2014  . TMJ tenderness 11/16/2013  . Other malaise and fatigue 05/07/2013  . Trapezius muscle spasm 11/27/2012  . Parkinson's disease ([place]) 05/24/2012  . General medical examination 11/24/2011  . HTN (hypertension) 08/31/2011  . Depression 08/31/2011  . Arthritis 08/31/2011  . Tremor 08/31/2011  . Benign hypertensive heart disease without heart failure 05/12/2011   PHYSICAL THERAPY DISCHARGE SUMMARY   Plan: Patient agrees to discharge.  Patient goals were partially met. Patient is being discharged due to meeting the stated rehab goals.  ?????      PAYSEUR,ANGIE PTA 06/17/2016, 12:04 PM  [place] [address] [place], [place], [address] Phone: [phone]   Fax:  [phone]  Name: NIAOMI CARTAYA MRN: [id number] Date of Birth: 1932/09/11

## 2016-06-18 ENCOUNTER — Telehealth: Payer: Self-pay | Admitting: Family Medicine

## 2016-06-18 NOTE — Telephone Encounter (Signed)
Pt called in. She says that she only want her clearance appt with her PCP. Informed pt of PCP current scheduled. Please advise for scheduling. Pt's surgery is scheduled for 8/23.

## 2016-06-18 NOTE — Telephone Encounter (Signed)
LMOM with contact name and number for return call RE: Surgical Clearance and Sx date being moved up to 07/07/16, as she will need a Surgical Clearance appt [30-min] with a provider that can get her in prior to new Sx date/SLS 08/04

## 2016-06-21 NOTE — Telephone Encounter (Signed)
Have pre-op form on desk waiting appt. On 06/29/2016.

## 2016-06-21 NOTE — Telephone Encounter (Signed)
Patient scheduled for 06/29/2016 for surgical clearance appointment.

## 2016-06-21 NOTE — Telephone Encounter (Signed)
Pt has appt scheduled with PCP on 06/29/16/SLS 08/07

## 2016-06-21 NOTE — Telephone Encounter (Signed)
Please try to schedule clearance for this patient if possible with PCP.

## 2016-06-23 ENCOUNTER — Ambulatory Visit (INDEPENDENT_AMBULATORY_CARE_PROVIDER_SITE_OTHER): Payer: Medicare Other | Admitting: Podiatry

## 2016-06-23 ENCOUNTER — Encounter: Payer: Self-pay | Admitting: Podiatry

## 2016-06-23 DIAGNOSIS — M79671 Pain in right foot: Secondary | ICD-10-CM

## 2016-06-23 DIAGNOSIS — L84 Corns and callosities: Secondary | ICD-10-CM

## 2016-06-23 DIAGNOSIS — M79672 Pain in left foot: Secondary | ICD-10-CM

## 2016-06-23 NOTE — Progress Notes (Signed)
   Subjective:    Patient ID: Stephanie [name], female    DOB: 1932/07/24, 82 y.o.   MRN: [id number]  HPI  82 year old female presents the office they for concerns of corns on the outside aspect of her fourth toes with a right side worse in left as her fourth and fifth toes approximately 2 other. She states that she's had some pads previously they are worn out and the callouses on thick. Denies any swelling or redness or any drainage. No other complaints at this time.    Review of Systems  All other systems reviewed and are negative.      Objective:   Physical Exam General: AAO x3, NAD  Dermatological: Hyperkeratotic lesions on the lateral aspect of the fourth digit the PIPJ. The right side is worse than left. Upon of a mini no underlying ulceration, drainage or other signs of infection. Small hyperkeratotic lesions medial hallux at the IPJ. Again no underlying ulceration or signs of infection. No other open lesions or pre-ulcer lesions identified at this time.  Vascular: Dorsalis Pedis artery and Posterior Tibial artery pedal pulses are 2/4 bilateral with immedate capillary fill time. Pedal hair growth present. There is no pain with calf compression, swelling, warmth, erythema.   Neruologic: Grossly intact via light touch bilateral. Vibratory intact via tuning fork bilateral. Protective threshold with Semmes Wienstein monofilament intact to all pedal sites bilateral.   Musculoskeletal: Hammertoes and adductovarus deformity is present. No other areas of tenderness except for hyperkeratotic lesions of the fourth toe. No other areas of tenderness bilateral. Into 5/5.     Assessment & Plan:  82 year old female symptomatic hyperkeratotic lesions due to digital deformities -Treatment options discussed including all alternatives, risks, and complications -Etiology of symptoms were discussed -Hyperkeratotic lesions debrided without, occasions or bleeding. Offloading pads were dispensed.  Discussed shoe gear modifications. -Monitor signs or symptoms of infection -Follow-up at her discretion or sooner if any issues are to arise.  *she is having hip replacement in 2 weeks  Celesta Gentile, DPM

## 2016-06-24 ENCOUNTER — Other Ambulatory Visit: Payer: Self-pay | Admitting: Family Medicine

## 2016-06-24 NOTE — Telephone Encounter (Signed)
Faxed hardcopy to [facility] in [place]

## 2016-06-25 ENCOUNTER — Encounter (HOSPITAL_COMMUNITY): Payer: Self-pay

## 2016-06-25 NOTE — Patient Instructions (Addendum)
Stephanie [name]  06/25/2016   Your procedure is scheduled on: 07/07/2016    Report to [facility] Main  Entrance take [place]  elevators to 3rd floor to  [place] at   1115 AM.  Call this number if you have problems the morning of surgery [phone]   Remember: ONLY 1 PERSON MAY GO WITH YOU TO SHORT STAY TO GET  READY MORNING OF [place].  Do not eat food after midnite the nite before surgery.  You may have clear liquids from 12 midnite until 0730am morning of surgery then nothing by mouth.       Take these medicines the morning of surgery with A SIP OF WATER: SINIMET, VALIUM IF NEEDED, EYE DROP              You may not have any metal on your body including hair pins and              piercings  Do not wear jewelry, make-up, lotions, powders or perfumes, deodorant             Do not wear nail polish.  Do not shave  48 hours prior to surgery.             Do not bring valuables to the hospital. [place].  Contacts, dentures or bridgework may not be worn into surgery.  Leave suitcase in the car. After surgery it may be brought to your room.         Special Instructions: coughing and deep breathing exercises, leg exercises               Please read over the following fact sheets you were given: _____________________________________________________________________                CLEAR LIQUID DIET   Foods Allowed                                                                     Foods Excluded  Coffee and tea, regular and decaf                             liquids that you cannot  Plain Jell-O in any flavor                                             see through such as: Fruit ices (not with fruit pulp)                                     milk, soups, orange juice  Iced Popsicles                                    All solid food  Carbonated beverages, regular and diet                                     Cranberry, grape and apple juices Sports drinks like Gatorade Lightly seasoned clear broth or consume(fat free) Sugar, honey syrup  Sample Menu Breakfast                                Lunch                                     Supper Cranberry juice                    Beef broth                            Chicken broth Jell-O                                     Grape juice                           Apple juice Coffee or tea                        Jell-O                                      Popsicle                                                Coffee or tea                        Coffee or tea  _____________________________________________________________________  [facility] - Preparing for Surgery Before surgery, you can play an important role.  Because skin is not sterile, your skin needs to be as free of germs as possible.  You can reduce the number of germs on your skin by washing with CHG (chlorahexidine gluconate) soap before surgery.  CHG is an antiseptic cleaner which kills germs and bonds with the skin to continue killing germs even after washing. Please DO NOT use if you have an allergy to CHG or antibacterial soaps.  If your skin becomes reddened/irritated stop using the CHG and inform your nurse when you arrive at Short Stay. Do not shave (including legs and underarms) for at least 48 hours prior to the first CHG shower.  You may shave your face/neck. Please follow these instructions carefully:  1.  Shower with CHG Soap the night before surgery and the  morning of Surgery.  2.  If you choose to wash your hair, wash your hair first as usual with your  normal  shampoo.  3.  After you shampoo, rinse your hair and body thoroughly to remove the  shampoo.  4.  Use CHG as you would any other liquid soap.  You can apply chg directly  to the skin and wash                       Gently with a scrungie or clean washcloth.  5.  Apply the CHG Soap to your body  ONLY FROM THE NECK DOWN.   Do not use on face/ open                           Wound or open sores. Avoid contact with eyes, ears mouth and genitals (private parts).                       Wash face,  Genitals (private parts) with your normal soap.             6.  Wash thoroughly, paying special attention to the area where your surgery  will be performed.  7.  Thoroughly rinse your body with warm water from the neck down.  8.  DO NOT shower/wash with your normal soap after using and rinsing off  the CHG Soap.                9.  Pat yourself dry with a clean towel.            10.  Wear clean pajamas.            11.  Place clean sheets on your bed the night of your first shower and do not  sleep with pets. Day of Surgery : Do not apply any lotions/deodorants the morning of surgery.  Please wear clean clothes to the hospital/surgery center.  FAILURE TO FOLLOW THESE INSTRUCTIONS MAY RESULT IN THE CANCELLATION OF YOUR SURGERY PATIENT SIGNATURE_________________________________  NURSE SIGNATURE__________________________________  ________________________________________________________________________  WHAT IS A BLOOD TRANSFUSION? Blood Transfusion Information  A transfusion is the replacement of blood or some of its parts. Blood is made up of multiple cells which provide different functions.  Red blood cells carry oxygen and are used for blood loss replacement.  White blood cells fight against infection.  Platelets control bleeding.  Plasma helps clot blood.  Other blood products are available for specialized needs, such as hemophilia or other clotting disorders. BEFORE THE TRANSFUSION  Who gives blood for transfusions?   Healthy volunteers who are fully evaluated to make sure their blood is safe. This is blood bank blood. Transfusion therapy is the safest it has ever been in the practice of medicine. Before blood is taken from a donor, a complete history is taken to make sure that person  has no history of diseases nor engages in risky social behavior (examples are intravenous drug use or sexual activity with multiple partners). The donor's travel history is screened to minimize risk of transmitting infections, such as malaria. The donated blood is tested for signs of infectious diseases, such as HIV and hepatitis. The blood is then tested to be sure it is compatible with you in order to minimize the chance of a transfusion reaction. If you or a relative donates blood, this is often done in anticipation of surgery and is not appropriate for emergency situations. It takes many days to process the donated blood. RISKS AND COMPLICATIONS Although transfusion therapy is very safe and saves many lives, the main dangers of transfusion include:   Getting an infectious disease.  Developing a transfusion reaction.  This is an allergic reaction to something in the blood you were given. Every precaution is taken to prevent this. The decision to have a blood transfusion has been considered carefully by your caregiver before blood is given. Blood is not given unless the benefits outweigh the risks. AFTER THE TRANSFUSION  Right after receiving a blood transfusion, you will usually feel much better and more energetic. This is especially true if your red blood cells have gotten low (anemic). The transfusion raises the level of the red blood cells which carry oxygen, and this usually causes an energy increase.  The nurse administering the transfusion will monitor you carefully for complications. HOME CARE INSTRUCTIONS  No special instructions are needed after a transfusion. You may find your energy is better. Speak with your caregiver about any limitations on activity for underlying diseases you may have. SEEK MEDICAL CARE IF:   Your condition is not improving after your transfusion.  You develop redness or irritation at the intravenous (IV) site. SEEK IMMEDIATE MEDICAL CARE IF:  Any of the  following symptoms occur over the next 12 hours:  Shaking chills.  You have a temperature by mouth above 102 F (38.9 C), not controlled by medicine.  Chest, back, or muscle pain.  People around you feel you are not acting correctly or are confused.  Shortness of breath or difficulty breathing.  Dizziness and fainting.  You get a rash or develop hives.  You have a decrease in urine output.  Your urine turns a dark color or changes to pink, red, or brown. Any of the following symptoms occur over the next 10 days:  You have a temperature by mouth above 102 F (38.9 C), not controlled by medicine.  Shortness of breath.  Weakness after normal activity.  The white part of the eye turns yellow (jaundice).  You have a decrease in the amount of urine or are urinating less often.  Your urine turns a dark color or changes to pink, red, or brown. Document Released: 10/29/2000 Document Revised: 01/24/2012 Document Reviewed: 06/17/2008 ExitCare Patient Information 2014 [place].  _______________________________________________________________________  Incentive Spirometer  An incentive spirometer is a tool that can help keep your lungs clear and active. This tool measures how well you are filling your lungs with each breath. Taking long deep breaths may help reverse or decrease the chance of developing breathing (pulmonary) problems (especially infection) following:  A long period of time when you are unable to move or be active. BEFORE THE PROCEDURE   If the spirometer includes an indicator to show your best effort, your nurse or respiratory therapist will set it to a desired goal.  If possible, sit up straight or lean slightly forward. Try not to slouch.  Hold the incentive spirometer in an upright position. INSTRUCTIONS FOR USE  1. Sit on the edge of your bed if possible, or sit up as far as you can in bed or on a chair. 2. Hold the incentive spirometer in an upright  position. 3. Breathe out normally. 4. Place the mouthpiece in your mouth and seal your lips tightly around it. 5. Breathe in slowly and as deeply as possible, raising the piston or the ball toward the top of the column. 6. Hold your breath for 3-5 seconds or for as long as possible. Allow the piston or ball to fall to the bottom of the column. 7. Remove the mouthpiece from your mouth and breathe out normally. 8. Rest for a few seconds and repeat Steps  1 through 7 at least 10 times every 1-2 hours when you are awake. Take your time and take a few normal breaths between deep breaths. 9. The spirometer may include an indicator to show your best effort. Use the indicator as a goal to work toward during each repetition. 10. After each set of 10 deep breaths, practice coughing to be sure your lungs are clear. If you have an incision (the cut made at the time of surgery), support your incision when coughing by placing a pillow or rolled up towels firmly against it. Once you are able to get out of bed, walk around indoors and cough well. You may stop using the incentive spirometer when instructed by your caregiver.  RISKS AND COMPLICATIONS  Take your time so you do not get dizzy or light-headed.  If you are in pain, you may need to take or ask for pain medication before doing incentive spirometry. It is harder to take a deep breath if you are having pain. AFTER USE  Rest and breathe slowly and easily.  It can be helpful to keep track of a log of your progress. Your caregiver can provide you with a simple table to help with this. If you are using the spirometer at home, follow these instructions: [place] IF:   You are having difficultly using the spirometer.  You have trouble using the spirometer as often as instructed.  Your pain medication is not giving enough relief while using the spirometer.  You develop fever of 100.5 F (38.1 C) or higher. SEEK IMMEDIATE MEDICAL CARE IF:    You cough up bloody sputum that had not been present before.  You develop fever of 102 F (38.9 C) or greater.  You develop worsening pain at or near the incision site. MAKE SURE YOU:   Understand these instructions.  Will watch your condition.  Will get help right away if you are not doing well or get worse. Document Released: 03/14/2007 Document Revised: 01/24/2012 Document Reviewed: 05/15/2007 [facility] Patient Information 2014 [place], [place].   ________________________________________________________________________

## 2016-06-28 ENCOUNTER — Ambulatory Visit: Payer: Self-pay | Admitting: Orthopedic Surgery

## 2016-06-29 ENCOUNTER — Encounter: Payer: Self-pay | Admitting: Family Medicine

## 2016-06-29 ENCOUNTER — Ambulatory Visit: Payer: Self-pay | Admitting: Orthopedic Surgery

## 2016-06-29 ENCOUNTER — Encounter (HOSPITAL_COMMUNITY)
Admission: RE | Admit: 2016-06-29 | Discharge: 2016-06-29 | Disposition: A | Discharge status: Home / Self Care | Payer: Medicare Other | Source: Ambulatory Visit | Attending: Orthopedic Surgery | Admitting: Orthopedic Surgery

## 2016-06-29 ENCOUNTER — Encounter (HOSPITAL_COMMUNITY): Payer: Self-pay

## 2016-06-29 ENCOUNTER — Telehealth: Payer: Self-pay | Admitting: Family Medicine

## 2016-06-29 ENCOUNTER — Ambulatory Visit (INDEPENDENT_AMBULATORY_CARE_PROVIDER_SITE_OTHER): Payer: Medicare Other | Admitting: Family Medicine

## 2016-06-29 DIAGNOSIS — I10 Essential (primary) hypertension: Secondary | ICD-10-CM

## 2016-06-29 DIAGNOSIS — Z9889 Other specified postprocedural states: Secondary | ICD-10-CM | POA: Diagnosis not present

## 2016-06-29 DIAGNOSIS — Z01810 Encounter for preprocedural cardiovascular examination: Secondary | ICD-10-CM | POA: Diagnosis not present

## 2016-06-29 DIAGNOSIS — E785 Hyperlipidemia, unspecified: Secondary | ICD-10-CM | POA: Insufficient documentation

## 2016-06-29 DIAGNOSIS — G20 Parkinson's disease: Secondary | ICD-10-CM

## 2016-06-29 DIAGNOSIS — M1990 Unspecified osteoarthritis, unspecified site: Secondary | ICD-10-CM | POA: Diagnosis not present

## 2016-06-29 DIAGNOSIS — Z01812 Encounter for preprocedural laboratory examination: Secondary | ICD-10-CM | POA: Diagnosis not present

## 2016-06-29 DIAGNOSIS — M169 Osteoarthritis of hip, unspecified: Secondary | ICD-10-CM

## 2016-06-29 HISTORY — DX: Melanocytic nevi, unspecified: D22.9

## 2016-06-29 HISTORY — DX: Personal history of urinary calculi: Z87.442

## 2016-06-29 LAB — CBC
HCT: 42.9 % (ref 36.0–46.0)
Hemoglobin: 14.6 g/dL (ref 12.0–15.0)
MCH: 30.2 pg (ref 26.0–34.0)
MCHC: 34 g/dL (ref 30.0–36.0)
MCV: 88.8 fL (ref 78.0–100.0)
PLATELETS: 237 10*3/uL (ref 150–400)
RBC: 4.83 MIL/uL (ref 3.87–5.11)
RDW: 13.4 % (ref 11.5–15.5)
WBC: 7.4 10*3/uL (ref 4.0–10.5)

## 2016-06-29 LAB — COMPREHENSIVE METABOLIC PANEL WITH GFR
ALT: 21 U/L (ref 14–54)
AST: 35 U/L (ref 15–41)
Albumin: 5.2 g/dL — ABNORMAL HIGH (ref 3.5–5.0)
Alkaline Phosphatase: 60 U/L (ref 38–126)
Anion gap: 8 (ref 5–15)
BUN: 26 mg/dL — ABNORMAL HIGH (ref 6–20)
CO2: 30 mmol/L (ref 22–32)
Calcium: 9.9 mg/dL (ref 8.9–10.3)
Chloride: 96 mmol/L — ABNORMAL LOW (ref 101–111)
Creatinine, Ser: 0.61 mg/dL (ref 0.44–1.00)
GFR calc Af Amer: >60 mL/min
GFR calc non Af Amer: >60 mL/min
Glucose, Bld: 100 mg/dL — ABNORMAL HIGH (ref 65–99)
Potassium: 4 mmol/L (ref 3.5–5.1)
Sodium: 134 mmol/L — ABNORMAL LOW (ref 135–145)
Total Bilirubin: 1 mg/dL (ref 0.3–1.2)
Total Protein: 7.5 g/dL (ref 6.5–8.1)

## 2016-06-29 LAB — URINE MICROSCOPIC-ADD ON

## 2016-06-29 LAB — URINALYSIS, ROUTINE W REFLEX MICROSCOPIC
BILIRUBIN URINE: NEGATIVE
GLUCOSE, UA: NEGATIVE mg/dL
HGB URINE DIPSTICK: NEGATIVE
KETONES UR: NEGATIVE mg/dL
NITRITE: NEGATIVE
PH: 7.5 (ref 5.0–8.0)
Protein, ur: NEGATIVE mg/dL
Specific Gravity, Urine: 1.019 (ref 1.005–1.030)

## 2016-06-29 LAB — ABO/RH: ABO/RH(D): A NEG

## 2016-06-29 LAB — PROTIME-INR
INR: 0.96
Prothrombin Time: 12.8 seconds (ref 11.4–15.2)

## 2016-06-29 LAB — SURGICAL PCR SCREEN
MRSA, PCR: NEGATIVE
Staphylococcus aureus: NEGATIVE

## 2016-06-29 LAB — APTT: aPTT: 27 s (ref 24–36)

## 2016-06-29 MED ORDER — HYDROCHLOROTHIAZIDE 25 MG PO TABS: 37.5000 mg | ORAL_TABLET | Freq: Every day | ORAL | 0 refills | Status: DC

## 2016-06-29 MED ORDER — DIAZEPAM 5 MG PO TABS: ORAL_TABLET | ORAL | 2 refills | Status: DC

## 2016-06-29 NOTE — Progress Notes (Signed)
Pre visit review using our clinic review tool, if applicable. No additional management support is needed unless otherwise documented below in the visit note. 

## 2016-06-29 NOTE — Progress Notes (Signed)
Patient test for blood clotting disorders 07-24-2010, results negative due to family history of blood clots, results placed on patient hcart

## 2016-06-29 NOTE — Patient Instructions (Signed)
Try aspercreme with lidocaine gel to neck   Cholesterol Cholesterol is a white, waxy, fat-like substance needed by your body in small amounts. The liver makes all the cholesterol you need. Cholesterol is carried from the liver by the blood through the blood vessels. Deposits of cholesterol (plaque) may build up on blood vessel walls. These make the arteries narrower and stiffer. Cholesterol plaques increase the risk for heart attack and stroke.  You cannot feel your cholesterol level even if it is very high. The only way to know it is high is with a blood test. Once you know your cholesterol levels, you should keep a record of the test results. Work with your health care provider to keep your levels in the desired range.  WHAT DO THE RESULTS MEAN?  Total cholesterol is a rough measure of all the cholesterol in your blood.   LDL is the so-called bad cholesterol. This is the type that deposits cholesterol in the walls of the arteries. You want this level to be low.   HDL is the good cholesterol because it cleans the arteries and carries the LDL away. You want this level to be high.  Triglycerides are fat that the body can either burn for energy or store. High levels are closely linked to heart disease.  WHAT ARE THE DESIRED LEVELS OF CHOLESTEROL?  Total cholesterol below 200.   LDL below 100 for people at risk, below 70 for those at very high risk.   HDL above 50 is good, above 60 is best.   Triglycerides below 150.  HOW CAN I LOWER MY CHOLESTEROL?  Diet. Follow your diet programs as directed by your health care provider.   Choose fish or white meat chicken and [place], roasted or baked. Limit fatty cuts of red meat, fried foods, and processed meats, such as sausage and lunch meats.   Eat lots of fresh fruits and vegetables.  Choose whole grains, beans, pasta, potatoes, and cereals.   Use only small amounts of olive, corn, or canola oils.   Avoid butter, mayonnaise,  shortening, or palm kernel oils.  Avoid foods with trans fats.   Drink skim or nonfat milk and eat low-fat or nonfat yogurt and cheeses. Avoid whole milk, cream, ice cream, egg yolks, and full-fat cheeses.   Healthy desserts include angel food cake, ginger snaps, animal crackers, hard candy, popsicles, and low-fat or nonfat frozen yogurt. Avoid pastries, cakes, pies, and cookies.   Exercise. Follow your exercise programs as directed by your health care provider.   A regular program helps decrease LDL and raise HDL.   A regular program helps with weight control.   Do things that increase your activity level like gardening, walking, or taking the stairs. Ask your health care provider about how you can be more active in your daily life.   Medicine. Take medicine only as directed by your health care provider.   Medicine may be prescribed by your health care provider to help lower cholesterol and decrease the risk for heart disease.   If you have several risk factors, you may need medicine even if your levels are normal.   This information is not intended to replace advice given to you by your health care provider. Make sure you discuss any questions you have with your health care provider.   Document Released: 07/27/2001 Document Revised: 11/22/2014 Document Reviewed: 08/15/2013 Elsevier Interactive Patient Education [employer].

## 2016-06-29 NOTE — Telephone Encounter (Signed)
Caller name: Clotiel Relation to pt: self Call back number: [phone] Pharmacy: [facility]  Reason for call: Pt called stating was seen at our office today (06-29-16) for surgery clearance and did notice was having red eyes this morning but thought that had to do with not sleeping well, but as the day has gone by her right eye is getting more red then this morning and has a small lump under her eye and right side of nose is running. Pt would like to know if she can get something at the pharmacy that can take care of the situation since she does not want anything to get worse before having surgery. Please advise.

## 2016-06-29 NOTE — Telephone Encounter (Signed)
She can have eye drops. Cortisporin Otic 5 drops b/l eye tid x 5 days. For nose take Mucinex bid and flonase. Call if fevers or new concerns develop

## 2016-07-01 MED ORDER — NEOMYCIN-POLYMYXIN-HC 3.5-10000-1 OP SUSP
5.0000 [drp] | Freq: Three times a day (TID) | OPHTHALMIC | 0 refills | Status: DC
Start: 2016-07-01 — End: 2016-09-17

## 2016-07-01 NOTE — Telephone Encounter (Signed)
Patient informed eye drops sent into pharmacy in [place].

## 2016-07-02 ENCOUNTER — Telehealth: Payer: Self-pay | Admitting: Family Medicine

## 2016-07-02 MED ORDER — HYDROCODONE-ACETAMINOPHEN 5-325 MG PO TABS: ORAL_TABLET | ORAL | 0 refills | Status: DC

## 2016-07-02 NOTE — Telephone Encounter (Signed)
Last refill on 06/04/2016 #20 with 0 refills Last office visit 06/29/2016

## 2016-07-02 NOTE — Telephone Encounter (Signed)
Printed and patient informed to pickup hardcopy at the front desk.

## 2016-07-02 NOTE — Telephone Encounter (Signed)
OK to refill requested med 

## 2016-07-02 NOTE — Telephone Encounter (Signed)
°  Relationship to patient: Self Can be reached: [phone]   Reason for call: Request refill on HYDROcodone-acetaminophen (NORCO/VICODIN) 5-325 MG tablet [id number]

## 2016-07-06 ENCOUNTER — Ambulatory Visit: Payer: Self-pay | Admitting: Orthopedic Surgery

## 2016-07-06 NOTE — H&P (Signed)
Stephanie [name] DOB: Apr 28, 1932 Married / Language: English / Race: White Female Date of Admission: 07/07/2016 CC:  Left hip pain History of Present Illness The patient is a 82 year old female who comes in  for a preoperative History and Physical. The patient is scheduled for a left total hip arthroplasty (anterior) to be performed by Dr. Dione Plover. Aluisio, MD at [facility] on 07/07/2016. The patient is a 82 year old female who presents with hip pain. The patient reports left lateral hip problems including pain and instability. She states that it feels as if her entire leg might give out a times. The symptoms began without any known injury. The patient reports symptoms radiating to the: left thigh posteriorly and lower back and left flank area. She has been treating with Dr. Nelva Bush for her back, and had an injection about two months ago. She states the injection helped with her back pain, but she then started having the lateral hip pain, along with pain going down the back of her leg. Symptoms are exacerbated by walking. Prior to being seen the patient was previously evaluated in this clinic 5 year(s) ago. Previous workup for this problem has included hip x-rays. She reports that several of her family members have had hip replacments, so that concerns her that she may end up needing one as well. She has pain in the left groin as well as the lateral aspect of her left hip that radiates down the anterior left thigh. It has been getitng progressively worse. Unfortunately, the left hip is very problematic for her. The pain is in the groin and lateral hip radiating to her left knee. It is occurring at all times. It is limiting what she can and cannot do. She is having to put a lot more stress on her right side and had some right knee discomfort with that. She said the right knee hurts, but nowhere near as bad as what the left hip is doing. She says she has a difficult time even walking. Her radiographs,  AP pelvis, AP and lateral of the left hip show that she has severe bone on bone arthritis of the left hip to the point where it is just about ankylosed. She has reached a point where she is ready to get the hip fixed at this time. They have been treated conservatively in the past for the above stated problem and despite conservative measures, they continue to have progressive pain and severe functional limitations and dysfunction. They have failed non-operative management including home exercise, medications. It is felt that they would benefit from undergoing total joint replacement. Risks and benefits of the procedure have been discussed with the patient and they elect to proceed with surgery. There are no active contraindications to surgery such as ongoing infection or rapidly progressive neurological disease.  Problem List/Past Medical  Lumbar radiculopathy (M54.16)  Primary osteoarthritis of lumbar spine (M47.816)  Primary osteoarthritis of cervical spine [id number])  Primary osteoarthritis of right knee (M17.11)  Primary osteoarthritis of left hip (M16.12)  Spondylolisthesis, lumbar region (M43.16)  Acquired Osteoarthritis  Parkinson's Disease  High blood pressure  Kidney Stone  Allergies PenicillAMINE *Miscellaneous Therapeutic Classes  local RXN following injection, redness Codeine Phosphate *ANALGESICS - OPIOID*  Nausea, Vomiting. Lodine *ANALGESICS - ANTI-INFLAMMATORY*  Rash.  Family History Cerebrovascular Accident  mother and father Congestive Heart Failure  father Heart Disease  mother, father and sister Hypertension  mother and sister Osteoarthritis  mother, father and sister  Social History  Alcohol use  current drinker; drinks wine; less than 5 per week Children  1 Current work status  retired Engineer, agricultural (Currently)  no Drug/Alcohol Rehab (Previously)  no Exercise  Exercises daily; does running / walking Illicit drug use  no Living  situation  live with spouse Marital status  married Number of flights of stairs before winded  greater than 5 Pain Contract  no Tobacco / smoke exposure  no Tobacco use  former smoker  Medication History Diclofenac Sodium (50MG  Tablet DR, 1 (one) Oral every twelve hours, as needed, Taken starting 05/13/2016) Active. HydroCHLOROthiazide (25MG  Tablet, Oral) Active. Valium (5MG  Tablet, Oral) Active. Carbidopa-Levodopa (25-100MG  Tablet, Oral) Active. Clobetasol Propionate (0.05% Solution, External) Active. Fluocinolone Acetonide (0.01% Solution, External) Active. Ketoconazole (2% Shampoo, External) Active. EpiPen 2-Pak (0.3MG /0.3ML Soln Auto-inj, Injection as needed) Active. MedroxyPROGESTERone Acetate (2.5MG  Tablet, Oral) Active. Estradiol (0.5MG  Tablet, Oral) Active. Multiple Vitamin (1 (one) Oral) Active. Calcium Citrate (500MG  Capsule, 1 (one) Oral) Active. (600 mg with D3) Co Q 10 (100MG  Capsule, Oral daily) Active. Sinemet CR (25-100MG  Tablet ER, 1 Oral three times daily) Active. Hydrocodone-Acetaminophen (5-325MG  Tablet, Oral) Active. TraMADol HCl (50MG  Tablet, Oral) Active.   Past Surgical History  Cataract Surgery  bilateral Dilation and Curettage of Uterus  Tonsillectomy  Right Wrist Surgery    Review of Systems  General Not Present- Chills, Fatigue, Fever, Memory Loss, Night Sweats, Weight Gain and Weight Loss. Skin Not Present- Eczema, Hives, Itching, Lesions and Rash. HEENT Not Present- Dentures, Double Vision, Headache, Hearing Loss, Tinnitus and Visual Loss. Respiratory Not Present- Allergies, Chronic Cough, Coughing up blood, Shortness of breath at rest and Shortness of breath with exertion. Cardiovascular Not Present- Chest Pain, Difficulty Breathing Lying Down, Murmur, Palpitations, Racing/skipping heartbeats and Swelling. Gastrointestinal Not Present- Abdominal Pain, Bloody Stool, Constipation, Diarrhea, Difficulty Swallowing, Heartburn,  Jaundice, Loss of appetitie, Nausea and Vomiting. Female Genitourinary Not Present- Blood in Urine, Discharge, Flank Pain, Incontinence, Painful Urination, Urgency, Urinary frequency, Urinary Retention, Urinating at Night and Weak urinary stream. Musculoskeletal Present- Joint Pain. Not Present- Back Pain, Joint Swelling, Morning Stiffness, Muscle Pain, Muscle Weakness and Spasms. Neurological Not Present- Blackout spells, Difficulty with balance, Dizziness, Paralysis, Tremor and Weakness. Psychiatric Not Present- Insomnia.  Vitals Weight: 122 lb Height: 64in Weight was reported by patient. Height was reported by patient. Body Surface Area: 1.59 m Body Mass Index: 20.94 kg/m  Pulse: 92 (Regular)  BP: 158/82 (Sitting, Right Arm, Standard)   Physical Exam General Mental Status -Alert, cooperative and good historian. General Appearance-pleasant, Not in acute distress. Orientation-Oriented X3. Build & Nutrition-Well nourished and Well developed.  Head and Neck Head-normocephalic, atraumatic . Neck Global Assessment - supple, no bruit auscultated on the right, no bruit auscultated on the left.  Eye Vision-Wears corrective lenses(readers). Pupil - Bilateral-Regular and Round. Motion - Bilateral-EOMI.  Chest and Lung Exam Auscultation Breath sounds - clear at anterior chest wall and clear at posterior chest wall. Adventitious sounds - No Adventitious sounds.  Cardiovascular Auscultation Rhythm - Regular rate and rhythm. Heart Sounds - S1 WNL and S2 WNL. Murmurs & Other Heart Sounds - Auscultation of the heart reveals - No Murmurs.  Abdomen Palpation/Percussion Tenderness - Abdomen is non-tender to palpation. Rigidity (guarding) - Abdomen is soft. Auscultation Auscultation of the abdomen reveals - Bowel sounds normal.  Female Genitourinary Note: Not done, not pertinent to present illness   Musculoskeletal Note: On exam, well-developed female,  alert and oriented, in no apparent distress. Evaluation of her right hip shows normal range  of motion with no discomfort. Her left hip can be flexed to about 100. Minimal internal rotation, about 10 degrees of external rotation, 10 to 20 degrees of abduction. Her knees show no effusion. Range is about 0 to 125 on each side. Slight tenderness over the medial aspect of right knee. No tenderness on left knee. No instability. Pulse, sensation and motor are intact. She has a significantly antalgic gait pattern on the left side.  DIAGNOSTIC IMAGING Her radiographs, AP pelvis, AP and lateral of the left hip show that she has severe bone on bone arthritis of the left hip to the point where it is just about ankylosed. She had AP and lateral both knees showing that there is a very slight arthritic change, the right worse than the left knee.  Assessment & Plan Primary osteoarthritis of right knee (M17.11) Primary osteoarthritis of left hip (M16.12)  Note:Surgical Plans: Left Total Hip Replacement - Anterior Approach  Disposition: Home  PCP: Dr. Charlett Blake  IV TXA  Anesthesia Issues: None, but prefers spinal due to history of Parkinson's  Signed electronically by Ok Edwards III PA-C

## 2016-07-07 ENCOUNTER — Inpatient Hospital Stay (HOSPITAL_COMMUNITY): Payer: Medicare Other | Admitting: Anesthesiology

## 2016-07-07 ENCOUNTER — Inpatient Hospital Stay (HOSPITAL_COMMUNITY)
Admission: RE | Admit: 2016-07-07 | Discharge: 2016-07-09 | DRG: 470 | Disposition: A | Discharge status: Home Health | Payer: Medicare Other | Source: Ambulatory Visit | Attending: Orthopedic Surgery | Admitting: Orthopedic Surgery

## 2016-07-07 ENCOUNTER — Inpatient Hospital Stay (HOSPITAL_COMMUNITY): Payer: Medicare Other

## 2016-07-07 ENCOUNTER — Encounter (HOSPITAL_COMMUNITY)
Admission: RE | Disposition: A | Discharge status: Home Health | Payer: Self-pay | Source: Ambulatory Visit | Attending: Orthopedic Surgery

## 2016-07-07 ENCOUNTER — Encounter (HOSPITAL_COMMUNITY): Payer: Self-pay | Admitting: *Deleted

## 2016-07-07 DIAGNOSIS — Z96642 Presence of left artificial hip joint: Secondary | ICD-10-CM | POA: Diagnosis not present

## 2016-07-07 DIAGNOSIS — M1612 Unilateral primary osteoarthritis, left hip: Secondary | ICD-10-CM | POA: Diagnosis present

## 2016-07-07 DIAGNOSIS — Z885 Allergy status to narcotic agent status: Secondary | ICD-10-CM

## 2016-07-07 DIAGNOSIS — M1711 Unilateral primary osteoarthritis, right knee: Secondary | ICD-10-CM | POA: Diagnosis present

## 2016-07-07 DIAGNOSIS — I10 Essential (primary) hypertension: Secondary | ICD-10-CM | POA: Diagnosis present

## 2016-07-07 DIAGNOSIS — Z96649 Presence of unspecified artificial hip joint: Secondary | ICD-10-CM

## 2016-07-07 DIAGNOSIS — Z87891 Personal history of nicotine dependence: Secondary | ICD-10-CM | POA: Diagnosis not present

## 2016-07-07 DIAGNOSIS — M169 Osteoarthritis of hip, unspecified: Secondary | ICD-10-CM | POA: Diagnosis present

## 2016-07-07 DIAGNOSIS — Z880 Allergy status to penicillin: Secondary | ICD-10-CM | POA: Diagnosis not present

## 2016-07-07 DIAGNOSIS — M5416 Radiculopathy, lumbar region: Secondary | ICD-10-CM | POA: Diagnosis present

## 2016-07-07 DIAGNOSIS — Z79899 Other long term (current) drug therapy: Secondary | ICD-10-CM

## 2016-07-07 DIAGNOSIS — M47892 Other spondylosis, cervical region: Secondary | ICD-10-CM | POA: Diagnosis present

## 2016-07-07 DIAGNOSIS — G20 Parkinson's disease: Secondary | ICD-10-CM | POA: Diagnosis present

## 2016-07-07 DIAGNOSIS — Z471 Aftercare following joint replacement surgery: Secondary | ICD-10-CM | POA: Diagnosis not present

## 2016-07-07 DIAGNOSIS — Z87442 Personal history of urinary calculi: Secondary | ICD-10-CM

## 2016-07-07 DIAGNOSIS — Z888 Allergy status to other drugs, medicaments and biological substances status: Secondary | ICD-10-CM | POA: Diagnosis not present

## 2016-07-07 DIAGNOSIS — M25552 Pain in left hip: Secondary | ICD-10-CM | POA: Diagnosis not present

## 2016-07-07 HISTORY — PX: TOTAL HIP ARTHROPLASTY: SHX124

## 2016-07-07 LAB — TYPE AND SCREEN
ABO/RH(D): A NEG
ANTIBODY SCREEN: NEGATIVE

## 2016-07-07 SURGERY — ARTHROPLASTY, HIP, TOTAL, ANTERIOR APPROACH
Anesthesia: Monitor Anesthesia Care | Site: Hip | Laterality: Left

## 2016-07-07 MED ORDER — METOCLOPRAMIDE HCL 5 MG/ML IJ SOLN: 5.0000 mg | Freq: Three times a day (TID) | INTRAMUSCULAR | Status: DC | PRN

## 2016-07-07 MED ORDER — RIVAROXABAN 10 MG PO TABS: 10.0000 mg | ORAL_TABLET | Freq: Every day | ORAL | 0 refills | Status: DC

## 2016-07-07 MED ORDER — FLEET ENEMA 7-19 GM/118ML RE ENEM: 1.0000 | ENEMA | Freq: Once | RECTAL | Status: DC | PRN

## 2016-07-07 MED ORDER — SODIUM CHLORIDE 0.9 % IV SOLN
INTRAVENOUS | Status: DC
  Administered 2016-07-07: 1000 mL via INTRAVENOUS

## 2016-07-07 MED ORDER — PROPOFOL 500 MG/50ML IV EMUL
INTRAVENOUS | Status: DC | PRN
  Administered 2016-07-07: 50 ug/kg/min via INTRAVENOUS

## 2016-07-07 MED ORDER — PHENOL 1.4 % MT LIQD: 1.0000 | OROMUCOSAL | Status: DC | PRN

## 2016-07-07 MED ORDER — DOCUSATE SODIUM 100 MG PO CAPS
100.0000 mg | ORAL_CAPSULE | Freq: Two times a day (BID) | ORAL | Status: DC
  Administered 2016-07-07 – 2016-07-09 (×4): 100 mg via ORAL
  Filled 2016-07-07 (×4): qty 1

## 2016-07-07 MED ORDER — HYDROMORPHONE HCL 2 MG PO TABS
2.0000 mg | ORAL_TABLET | ORAL | Status: DC | PRN
  Administered 2016-07-07 (×2): 2 mg via ORAL
  Administered 2016-07-08 (×2): 4 mg via ORAL
  Administered 2016-07-08 (×2): 2 mg via ORAL
  Administered 2016-07-08: 4 mg via ORAL
  Administered 2016-07-08 – 2016-07-09 (×2): 2 mg via ORAL
  Administered 2016-07-09: 4 mg via ORAL
  Administered 2016-07-09: 2 mg via ORAL
  Filled 2016-07-07 (×4): qty 1
  Filled 2016-07-07 (×5): qty 2
  Filled 2016-07-07 (×2): qty 1

## 2016-07-07 MED ORDER — ACETAMINOPHEN 10 MG/ML IV SOLN
INTRAVENOUS | Status: AC
  Filled 2016-07-07: qty 100

## 2016-07-07 MED ORDER — ACETAMINOPHEN 500 MG PO TABS
1000.0000 mg | ORAL_TABLET | Freq: Four times a day (QID) | ORAL | Status: AC
  Administered 2016-07-07 – 2016-07-08 (×4): 1000 mg via ORAL
  Filled 2016-07-07 (×4): qty 2

## 2016-07-07 MED ORDER — DIAZEPAM 5 MG PO TABS: 2.5000 mg | ORAL_TABLET | Freq: Three times a day (TID) | ORAL | Status: DC | PRN

## 2016-07-07 MED ORDER — ONDANSETRON HCL 4 MG/2ML IJ SOLN: 4.0000 mg | Freq: Four times a day (QID) | INTRAMUSCULAR | Status: DC | PRN

## 2016-07-07 MED ORDER — BUPIVACAINE HCL (PF) 0.75 % IJ SOLN
INTRAMUSCULAR | Status: DC | PRN
  Administered 2016-07-07: 1.6 mL via INTRATHECAL

## 2016-07-07 MED ORDER — FENTANYL CITRATE (PF) 100 MCG/2ML IJ SOLN
INTRAMUSCULAR | Status: DC | PRN
  Administered 2016-07-07: 100 ug via INTRAVENOUS

## 2016-07-07 MED ORDER — BUPIVACAINE HCL (PF) 0.25 % IJ SOLN
INTRAMUSCULAR | Status: AC
  Filled 2016-07-07: qty 30

## 2016-07-07 MED ORDER — HYDROCHLOROTHIAZIDE 25 MG PO TABS
37.5000 mg | ORAL_TABLET | Freq: Every day | ORAL | Status: DC
  Administered 2016-07-08 – 2016-07-09 (×2): 37.5 mg via ORAL
  Filled 2016-07-07 (×2): qty 2

## 2016-07-07 MED ORDER — ONDANSETRON HCL 4 MG/2ML IJ SOLN
INTRAMUSCULAR | Status: DC | PRN
  Administered 2016-07-07: 4 mg via INTRAVENOUS

## 2016-07-07 MED ORDER — ACETAMINOPHEN 325 MG PO TABS
650.0000 mg | ORAL_TABLET | Freq: Four times a day (QID) | ORAL | Status: DC | PRN
  Administered 2016-07-08: 650 mg via ORAL

## 2016-07-07 MED ORDER — 0.9 % SODIUM CHLORIDE (POUR BTL) OPTIME
TOPICAL | Status: DC | PRN
  Administered 2016-07-07: 1000 mL

## 2016-07-07 MED ORDER — HYDROMORPHONE HCL 1 MG/ML IJ SOLN
INTRAMUSCULAR | Status: AC
  Filled 2016-07-07: qty 1

## 2016-07-07 MED ORDER — CHLORHEXIDINE GLUCONATE 4 % EX LIQD: 60.0000 mL | Freq: Once | CUTANEOUS | Status: DC

## 2016-07-07 MED ORDER — ACETAMINOPHEN 650 MG RE SUPP: 650.0000 mg | Freq: Four times a day (QID) | RECTAL | Status: DC | PRN

## 2016-07-07 MED ORDER — DIPHENHYDRAMINE HCL 12.5 MG/5ML PO ELIX
12.5000 mg | ORAL_SOLUTION | ORAL | Status: DC | PRN
Start: 2016-07-07 — End: 2016-07-09

## 2016-07-07 MED ORDER — POLYETHYLENE GLYCOL 3350 17 G PO PACK
17.0000 g | PACK | Freq: Every day | ORAL | Status: DC | PRN
  Administered 2016-07-08: 17 g via ORAL
  Filled 2016-07-07: qty 1

## 2016-07-07 MED ORDER — LACTATED RINGERS IV SOLN
INTRAVENOUS | Status: DC
  Administered 2016-07-07 (×2): via INTRAVENOUS

## 2016-07-07 MED ORDER — RIVAROXABAN 10 MG PO TABS
10.0000 mg | ORAL_TABLET | Freq: Every day | ORAL | Status: DC
Start: 2016-07-08 — End: 2016-07-09
  Administered 2016-07-08 – 2016-07-09 (×2): 10 mg via ORAL
  Filled 2016-07-07 (×2): qty 1

## 2016-07-07 MED ORDER — CARBIDOPA-LEVODOPA 25-100 MG PO TABS
1.0000 | ORAL_TABLET | Freq: Three times a day (TID) | ORAL | Status: DC
  Administered 2016-07-07 – 2016-07-09 (×6): 1 via ORAL
  Filled 2016-07-07 (×10): qty 1

## 2016-07-07 MED ORDER — METHOCARBAMOL 500 MG PO TABS
500.0000 mg | ORAL_TABLET | Freq: Four times a day (QID) | ORAL | Status: DC | PRN
  Administered 2016-07-07 – 2016-07-09 (×3): 500 mg via ORAL
  Filled 2016-07-07 (×3): qty 1

## 2016-07-07 MED ORDER — ACETAMINOPHEN 10 MG/ML IV SOLN
1000.0000 mg | Freq: Once | INTRAVENOUS | Status: AC
  Administered 2016-07-07: 1000 mg via INTRAVENOUS

## 2016-07-07 MED ORDER — ONDANSETRON HCL 4 MG PO TABS: 4.0000 mg | ORAL_TABLET | Freq: Four times a day (QID) | ORAL | Status: DC | PRN

## 2016-07-07 MED ORDER — DEXAMETHASONE SODIUM PHOSPHATE 10 MG/ML IJ SOLN
10.0000 mg | Freq: Once | INTRAMUSCULAR | Status: AC
  Administered 2016-07-08: 10 mg via INTRAVENOUS
  Filled 2016-07-07: qty 1

## 2016-07-07 MED ORDER — HYDROMORPHONE HCL 1 MG/ML IJ SOLN
0.2500 mg | INTRAMUSCULAR | Status: DC | PRN
  Administered 2016-07-07 (×5): 0.25 mg via INTRAVENOUS

## 2016-07-07 MED ORDER — PHENYLEPHRINE HCL 10 MG/ML IJ SOLN
INTRAVENOUS | Status: DC | PRN
  Administered 2016-07-07: 20 ug/min via INTRAVENOUS

## 2016-07-07 MED ORDER — METOCLOPRAMIDE HCL 5 MG PO TABS: 5.0000 mg | ORAL_TABLET | Freq: Three times a day (TID) | ORAL | Status: DC | PRN

## 2016-07-07 MED ORDER — PROPOFOL 10 MG/ML IV BOLUS
INTRAVENOUS | Status: AC
  Filled 2016-07-07: qty 40

## 2016-07-07 MED ORDER — HYDROMORPHONE HCL 1 MG/ML IJ SOLN
0.5000 mg | INTRAMUSCULAR | Status: DC | PRN
  Administered 2016-07-07: 0.5 mg via INTRAVENOUS
  Filled 2016-07-07: qty 1

## 2016-07-07 MED ORDER — MENTHOL 3 MG MT LOZG: 1.0000 | LOZENGE | OROMUCOSAL | Status: DC | PRN

## 2016-07-07 MED ORDER — HYDROMORPHONE HCL 2 MG PO TABS: 2.0000 mg | ORAL_TABLET | ORAL | 0 refills | Status: DC | PRN

## 2016-07-07 MED ORDER — PHENYLEPHRINE HCL 10 MG/ML IJ SOLN
INTRAMUSCULAR | Status: DC | PRN
  Administered 2016-07-07: 80 ug via INTRAVENOUS

## 2016-07-07 MED ORDER — DEXAMETHASONE SODIUM PHOSPHATE 10 MG/ML IJ SOLN
10.0000 mg | Freq: Once | INTRAMUSCULAR | Status: AC
  Administered 2016-07-07: 10 mg via INTRAVENOUS

## 2016-07-07 MED ORDER — BISACODYL 10 MG RE SUPP: 10.0000 mg | Freq: Every day | RECTAL | Status: DC | PRN

## 2016-07-07 MED ORDER — DEXTROSE 5 % IV SOLN
500.0000 mg | Freq: Four times a day (QID) | INTRAVENOUS | Status: DC | PRN
  Administered 2016-07-07: 500 mg via INTRAVENOUS
  Filled 2016-07-07: qty 5
  Filled 2016-07-07: qty 550

## 2016-07-07 MED ORDER — FENTANYL CITRATE (PF) 100 MCG/2ML IJ SOLN
INTRAMUSCULAR | Status: AC
  Filled 2016-07-07: qty 2

## 2016-07-07 MED ORDER — LACTATED RINGERS IV SOLN
INTRAVENOUS | Status: DC
  Administered 2016-07-07: 12:00:00 via INTRAVENOUS

## 2016-07-07 MED ORDER — STERILE WATER FOR IRRIGATION IR SOLN
Status: DC | PRN
  Administered 2016-07-07: 3000 mL

## 2016-07-07 MED ORDER — BUPIVACAINE HCL (PF) 0.25 % IJ SOLN
INTRAMUSCULAR | Status: DC | PRN
  Administered 2016-07-07: 30 mL

## 2016-07-07 MED ORDER — VANCOMYCIN HCL IN DEXTROSE 1-5 GM/200ML-% IV SOLN
1000.0000 mg | INTRAVENOUS | Status: AC
  Administered 2016-07-07: 1000 mg via INTRAVENOUS
  Filled 2016-07-07: qty 200

## 2016-07-07 MED ORDER — TRANEXAMIC ACID 1000 MG/10ML IV SOLN
1000.0000 mg | Freq: Once | INTRAVENOUS | Status: AC
  Administered 2016-07-07: 1000 mg via INTRAVENOUS
  Filled 2016-07-07: qty 10

## 2016-07-07 MED ORDER — METHOCARBAMOL 500 MG PO TABS: 500.0000 mg | ORAL_TABLET | Freq: Four times a day (QID) | ORAL | 0 refills | Status: DC | PRN

## 2016-07-07 MED ORDER — SODIUM CHLORIDE 0.9 % IV SOLN
1000.0000 mg | INTRAVENOUS | Status: AC
  Administered 2016-07-07: 1000 mg via INTRAVENOUS
  Filled 2016-07-07: qty 10

## 2016-07-07 MED ORDER — VANCOMYCIN HCL IN DEXTROSE 1-5 GM/200ML-% IV SOLN
1000.0000 mg | Freq: Two times a day (BID) | INTRAVENOUS | Status: AC
  Administered 2016-07-08: 1000 mg via INTRAVENOUS
  Filled 2016-07-07: qty 200

## 2016-07-07 SURGICAL SUPPLY — 34 items
BAG DECANTER FOR FLEXI CONT (MISCELLANEOUS) ×2 IMPLANT
BAG SPEC THK2 15X12 ZIP CLS (MISCELLANEOUS)
BAG ZIPLOCK 12X15 (MISCELLANEOUS) IMPLANT
BLADE SAG 18X100X1.27 (BLADE) ×2 IMPLANT
CAPT HIP TOTAL 2 ×1 IMPLANT
CLOTH BEACON ORANGE TIMEOUT ST (SAFETY) ×2 IMPLANT
COVER PERINEAL POST (MISCELLANEOUS) ×2 IMPLANT
DECANTER SPIKE VIAL GLASS SM (MISCELLANEOUS) ×1 IMPLANT
DRAPE STERI IOBAN 125X83 (DRAPES) ×2 IMPLANT
DRAPE U-SHAPE 47X51 STRL (DRAPES) ×4 IMPLANT
DRSG ADAPTIC 3X8 NADH LF (GAUZE/BANDAGES/DRESSINGS) ×2 IMPLANT
DRSG MEPILEX BORDER 4X4 (GAUZE/BANDAGES/DRESSINGS) ×2 IMPLANT
DRSG MEPILEX BORDER 4X8 (GAUZE/BANDAGES/DRESSINGS) ×2 IMPLANT
DURAPREP 26ML APPLICATOR (WOUND CARE) ×2 IMPLANT
ELECT REM PT RETURN 9FT ADLT (ELECTROSURGICAL) ×2
ELECTRODE REM PT RTRN 9FT ADLT (ELECTROSURGICAL) ×1 IMPLANT
EVACUATOR 1/8 PVC DRAIN (DRAIN) ×2 IMPLANT
GLOVE BIO SURGEON STRL SZ7.5 (GLOVE) ×2 IMPLANT
GLOVE BIO SURGEON STRL SZ8 (GLOVE) ×4 IMPLANT
GLOVE BIOGEL PI IND STRL 8 (GLOVE) ×2 IMPLANT
GLOVE BIOGEL PI INDICATOR 8 (GLOVE) ×8
GOWN STRL REUS W/TWL LRG LVL3 (GOWN DISPOSABLE) ×4 IMPLANT
GOWN STRL REUS W/TWL XL LVL3 (GOWN DISPOSABLE) ×2 IMPLANT
PACK ANTERIOR HIP CUSTOM (KITS) ×2 IMPLANT
STRIP CLOSURE SKIN 1/2X4 (GAUZE/BANDAGES/DRESSINGS) ×2 IMPLANT
SUT ETHIBOND NAB CT1 #1 30IN (SUTURE) ×2 IMPLANT
SUT MNCRL AB 4-0 PS2 18 (SUTURE) ×2 IMPLANT
SUT VIC AB 2-0 CT1 27 (SUTURE) ×4
SUT VIC AB 2-0 CT1 TAPERPNT 27 (SUTURE) ×2 IMPLANT
SUT VLOC 180 0 24IN GS25 (SUTURE) ×2 IMPLANT
SYR 50ML LL SCALE MARK (SYRINGE) IMPLANT
TRAY FOLEY W/METER SILVER 14FR (SET/KITS/TRAYS/PACK) ×2 IMPLANT
TRAY FOLEY W/METER SILVER 16FR (SET/KITS/TRAYS/PACK) ×1 IMPLANT
YANKAUER SUCT BULB TIP 10FT TU (MISCELLANEOUS) ×2 IMPLANT

## 2016-07-07 NOTE — Progress Notes (Signed)
RN brought IV dilaudid to bedside, however pt resting.  Medication placed on bedside table, RN at bedside, pt awakened and RN went to give medication, and it was not visible.  Pharmacy notified after RN not able to locate medication.

## 2016-07-07 NOTE — Interval H&P Note (Signed)
History and Physical Interval Note:  07/07/2016 1:56 PM  Stephanie [name]  has presented today for surgery, with the diagnosis of LEFT HIP OA  The various methods of treatment have been discussed with the patient and family. After consideration of risks, benefits and other options for treatment, the patient has consented to  Procedure(s): LEFT TOTAL HIP ARTHROPLASTY ANTERIOR APPROACH (Left) as a surgical intervention .  The patient's history has been reviewed, patient examined, no change in status, stable for surgery.  I have reviewed the patient's chart and labs.  Questions were answered to the patient's satisfaction.     Gearlean Alf

## 2016-07-07 NOTE — Anesthesia Procedure Notes (Signed)
Spinal  Start time: 07/07/2016 2:05 PM End time: 07/07/2016 2:10 PM Staffing Resident/CRNA: Gean Maidens Performed: resident/CRNA  Preanesthetic Checklist Completed: patient identified, site marked, surgical consent, pre-op evaluation, timeout performed, IV checked, risks and benefits discussed and monitors and equipment checked Spinal Block Patient position: sitting Prep: Betadine Patient monitoring: heart rate, continuous pulse ox and blood pressure Approach: midline Location: L3-4 Needle Needle type: Sprotte  Needle length: 9 cm Needle insertion depth: 6 cm Additional Notes Pt sitting position sterile prep and drape, negative paresthesia, negative heme.

## 2016-07-07 NOTE — Op Note (Signed)
OPERATIVE REPORT- TOTAL HIP ARTHROPLASTY   PREOPERATIVE DIAGNOSIS: Osteoarthritis of the Left hip.   POSTOPERATIVE DIAGNOSIS: Osteoarthritis of the Left  hip.   PROCEDURE: Left total hip arthroplasty, anterior approach.   SURGEON: Gaynelle Arabian, MD   ASSISTANT: Arlee Muslim, PA-C  ANESTHESIA:  Spinal  ESTIMATED BLOOD LOSS:-100 ml    DRAINS: Hemovac x1.   COMPLICATIONS: None   CONDITION: PACU - hemodynamically stable.   BRIEF CLINICAL NOTE: Stephanie [name] is a 82 y.o. female who has advanced end-  stage arthritis of their Left  hip with progressively worsening pain and  dysfunction.The patient has failed nonoperative management and presents for  total hip arthroplasty.   PROCEDURE IN DETAIL: After successful administration of spinal  anesthetic, the traction boots for the [facility] bed were placed on both  feet and the patient was placed onto the [facility] bed, boots placed into the leg  holders. The Left hip was then isolated from the perineum with plastic  drapes and prepped and draped in the usual sterile fashion. ASIS and  greater trochanter were marked and a oblique incision was made, starting  at about 1 cm lateral and 2 cm distal to the ASIS and coursing towards  the anterior cortex of the femur. The skin was cut with a 10 blade  through subcutaneous tissue to the level of the fascia overlying the  tensor fascia lata muscle. The fascia was then incised in line with the  incision at the junction of the anterior third and posterior 2/3rd. The  muscle was teased off the fascia and then the interval between the TFL  and the rectus was developed. The Hohmann retractor was then placed at  the top of the femoral neck over the capsule. The vessels overlying the  capsule were cauterized and the fat on top of the capsule was removed.  A Hohmann retractor was then placed anterior underneath the rectus  femoris to give exposure to the entire anterior capsule. A T-shaped   capsulotomy was performed. The edges were tagged and the femoral head  was identified.       Osteophytes are removed off the superior acetabulum.  The femoral neck was then cut in situ with an oscillating saw. Traction  was then applied to the left lower extremity utilizing the [facility]  traction. The femoral head was then removed. Retractors were placed  around the acetabulum and then circumferential removal of the labrum was  performed. Osteophytes were also removed. Reaming starts at 45 mm to  medialize and  Increased in 2 mm increments to 49 mm. We reamed in  approximately 40 degrees of abduction, 20 degrees anteversion. A 50 mm  pinnacle acetabular shell was then impacted in anatomic position under  fluoroscopic guidance with excellent purchase. We did not need to place  any additional dome screws. A 32 mm neutral + 4 marathon liner was then  placed into the acetabular shell.       The femoral lift was then placed along the lateral aspect of the femur  just distal to the vastus ridge. The leg was  externally rotated and capsule  was stripped off the inferior aspect of the femoral neck down to the  level of the lesser trochanter, this was done with electrocautery. The femur was lifted after this was performed. The  leg was then placed in an extended and adducted position essentially delivering the femur. We also removed the capsule superiorly and the piriformis from the piriformis  fossa to gain excellent exposure of the  proximal femur. Rongeur was used to remove some cancellous bone to get  into the lateral portion of the proximal femur for placement of the  initial starter reamer. The starter broaches was placed  the starter broach  and was shown to go down the center of the canal. Broaching  with the  Corail system was then performed starting at size 8, coursing  Up to size 11. A size 11 had excellent torsional and rotational  and axial stability. The trial standard offset neck was then  placed  with a 32 + 1 trial head. The hip was then reduced. We confirmed that  the stem was in the canal both on AP and lateral x-rays. It also has excellent sizing. The hip was reduced with outstanding stability through full extension and full external rotation.. AP pelvis was taken and the leg lengths were measured and found to be equal. Hip was then dislocated again and the femoral head and neck removed. The  femoral broach was removed. Size 11 Corail stem with a standard offset  neck was then impacted into the femur following native anteversion. Has  excellent purchase in the canal. Excellent torsional and rotational and  axial stability. It is confirmed to be in the canal on AP and lateral  fluoroscopic views. The 32+1 ceramic head was placed and the hip  reduced with outstanding stability. Again AP pelvis was taken and it  confirmed that the leg lengths were equal. The wound was then copiously  irrigated with saline solution and the capsule reattached and repaired  with Ethibond suture. 30 ml of .25% Bupivicaine was  injected into the capsule and into the edge of the tensor fascia lata as well as subcutaneous tissue. The fascia overlying the tensor fascia lata was then closed with a running #1 V-Loc. Subcu was closed with interrupted 2-0 Vicryl and subcuticular running 4-0 Monocryl. Incision was cleaned  and dried. Steri-Strips and a bulky sterile dressing applied. Hemovac  drain was hooked to suction and then the patient was awakened and transported to  recovery in stable condition.        Please note that a surgical assistant was a medical necessity for this procedure to perform it in a safe and expeditious manner. Assistant was necessary to provide appropriate retraction of vital neurovascular structures and to prevent femoral fracture and allow for anatomic placement of the prosthesis.  Gaynelle Arabian, M.D.

## 2016-07-07 NOTE — H&P (View-Only) (Signed)
Stephanie [name] DOB: Apr 28, 1932 Married / Language: English / Race: White Female Date of Admission: 07/07/2016 CC:  Left hip pain History of Present Illness The patient is a 82 year old female who comes in  for a preoperative History and Physical. The patient is scheduled for a left total hip arthroplasty (anterior) to be performed by Dr. Dione Plover. Aluisio, MD at [facility] on 07/07/2016. The patient is a 82 year old female who presents with hip pain. The patient reports left lateral hip problems including pain and instability. She states that it feels as if her entire leg might give out a times. The symptoms began without any known injury. The patient reports symptoms radiating to the: left thigh posteriorly and lower back and left flank area. She has been treating with Dr. Nelva Bush for her back, and had an injection about two months ago. She states the injection helped with her back pain, but she then started having the lateral hip pain, along with pain going down the back of her leg. Symptoms are exacerbated by walking. Prior to being seen the patient was previously evaluated in this clinic 5 year(s) ago. Previous workup for this problem has included hip x-rays. She reports that several of her family members have had hip replacments, so that concerns her that she may end up needing one as well. She has pain in the left groin as well as the lateral aspect of her left hip that radiates down the anterior left thigh. It has been getitng progressively worse. Unfortunately, the left hip is very problematic for her. The pain is in the groin and lateral hip radiating to her left knee. It is occurring at all times. It is limiting what she can and cannot do. She is having to put a lot more stress on her right side and had some right knee discomfort with that. She said the right knee hurts, but nowhere near as bad as what the left hip is doing. She says she has a difficult time even walking. Her radiographs,  AP pelvis, AP and lateral of the left hip show that she has severe bone on bone arthritis of the left hip to the point where it is just about ankylosed. She has reached a point where she is ready to get the hip fixed at this time. They have been treated conservatively in the past for the above stated problem and despite conservative measures, they continue to have progressive pain and severe functional limitations and dysfunction. They have failed non-operative management including home exercise, medications. It is felt that they would benefit from undergoing total joint replacement. Risks and benefits of the procedure have been discussed with the patient and they elect to proceed with surgery. There are no active contraindications to surgery such as ongoing infection or rapidly progressive neurological disease.  Problem List/Past Medical  Lumbar radiculopathy (M54.16)  Primary osteoarthritis of lumbar spine (M47.816)  Primary osteoarthritis of cervical spine [id number]  Primary osteoarthritis of right knee (M17.11)  Primary osteoarthritis of left hip (M16.12)  Spondylolisthesis, lumbar region (M43.16)  Acquired Osteoarthritis  Parkinson's Disease  High blood pressure  Kidney Stone  Allergies PenicillAMINE *Miscellaneous Therapeutic Classes  local RXN following injection, redness Codeine Phosphate *ANALGESICS - OPIOID*  Nausea, Vomiting. Lodine *ANALGESICS - ANTI-INFLAMMATORY*  Rash.  Family History Cerebrovascular Accident  mother and father Congestive Heart Failure  father Heart Disease  mother, father and sister Hypertension  mother and sister Osteoarthritis  mother, father and sister  Social History  Alcohol use  current drinker; drinks wine; less than 5 per week Children  1 Current work status  retired Engineer, agricultural (Currently)  no Drug/Alcohol Rehab (Previously)  no Exercise  Exercises daily; does running / walking Illicit drug use  no Living  situation  live with spouse Marital status  married Number of flights of stairs before winded  greater than 5 Pain Contract  no Tobacco / smoke exposure  no Tobacco use  former smoker  Medication History Diclofenac Sodium (50MG  Tablet DR, 1 (one) Oral every twelve hours, as needed, Taken starting 05/13/2016) Active. HydroCHLOROthiazide (25MG  Tablet, Oral) Active. Valium (5MG  Tablet, Oral) Active. Carbidopa-Levodopa (25-100MG  Tablet, Oral) Active. Clobetasol Propionate (0.05% Solution, External) Active. Fluocinolone Acetonide (0.01% Solution, External) Active. Ketoconazole (2% Shampoo, External) Active. EpiPen 2-Pak (0.3MG /0.3ML Soln Auto-inj, Injection as needed) Active. MedroxyPROGESTERone Acetate (2.5MG  Tablet, Oral) Active. Estradiol (0.5MG  Tablet, Oral) Active. Multiple Vitamin (1 (one) Oral) Active. Calcium Citrate (500MG  Capsule, 1 (one) Oral) Active. (600 mg with D3) Co Q 10 (100MG  Capsule, Oral daily) Active. Sinemet CR (25-100MG  Tablet ER, 1 Oral three times daily) Active. Hydrocodone-Acetaminophen (5-325MG  Tablet, Oral) Active. TraMADol HCl (50MG  Tablet, Oral) Active.   Past Surgical History  Cataract Surgery  bilateral Dilation and Curettage of Uterus  Tonsillectomy  Right Wrist Surgery    Review of Systems  General Not Present- Chills, Fatigue, Fever, Memory Loss, Night Sweats, Weight Gain and Weight Loss. Skin Not Present- Eczema, Hives, Itching, Lesions and Rash. HEENT Not Present- Dentures, Double Vision, Headache, Hearing Loss, Tinnitus and Visual Loss. Respiratory Not Present- Allergies, Chronic Cough, Coughing up blood, Shortness of breath at rest and Shortness of breath with exertion. Cardiovascular Not Present- Chest Pain, Difficulty Breathing Lying Down, Murmur, Palpitations, Racing/skipping heartbeats and Swelling. Gastrointestinal Not Present- Abdominal Pain, Bloody Stool, Constipation, Diarrhea, Difficulty Swallowing, Heartburn,  Jaundice, Loss of appetitie, Nausea and Vomiting. Female Genitourinary Not Present- Blood in Urine, Discharge, Flank Pain, Incontinence, Painful Urination, Urgency, Urinary frequency, Urinary Retention, Urinating at Night and Weak urinary stream. Musculoskeletal Present- Joint Pain. Not Present- Back Pain, Joint Swelling, Morning Stiffness, Muscle Pain, Muscle Weakness and Spasms. Neurological Not Present- Blackout spells, Difficulty with balance, Dizziness, Paralysis, Tremor and Weakness. Psychiatric Not Present- Insomnia.  Vitals Weight: 122 lb Height: 64in Weight was reported by patient. Height was reported by patient. Body Surface Area: 1.59 m Body Mass Index: 20.94 kg/m  Pulse: 92 (Regular)  BP: 158/82 (Sitting, Right Arm, Standard)   Physical Exam General Mental Status -Alert, cooperative and good historian. General Appearance-pleasant, Not in acute distress. Orientation-Oriented X3. Build & Nutrition-Well nourished and Well developed.  Head and Neck Head-normocephalic, atraumatic . Neck Global Assessment - supple, no bruit auscultated on the right, no bruit auscultated on the left.  Eye Vision-Wears corrective lenses(readers). Pupil - Bilateral-Regular and Round. Motion - Bilateral-EOMI.  Chest and Lung Exam Auscultation Breath sounds - clear at anterior chest wall and clear at posterior chest wall. Adventitious sounds - No Adventitious sounds.  Cardiovascular Auscultation Rhythm - Regular rate and rhythm. Heart Sounds - S1 WNL and S2 WNL. Murmurs & Other Heart Sounds - Auscultation of the heart reveals - No Murmurs.  Abdomen Palpation/Percussion Tenderness - Abdomen is non-tender to palpation. Rigidity (guarding) - Abdomen is soft. Auscultation Auscultation of the abdomen reveals - Bowel sounds normal.  Female Genitourinary Note: Not done, not pertinent to present illness   Musculoskeletal Note: On exam, well-developed female,  alert and oriented, in no apparent distress. Evaluation of her right hip shows normal range  of motion with no discomfort. Her left hip can be flexed to about 100. Minimal internal rotation, about 10 degrees of external rotation, 10 to 20 degrees of abduction. Her knees show no effusion. Range is about 0 to 125 on each side. Slight tenderness over the medial aspect of right knee. No tenderness on left knee. No instability. Pulse, sensation and motor are intact. She has a significantly antalgic gait pattern on the left side.  DIAGNOSTIC IMAGING Her radiographs, AP pelvis, AP and lateral of the left hip show that she has severe bone on bone arthritis of the left hip to the point where it is just about ankylosed. She had AP and lateral both knees showing that there is a very slight arthritic change, the right worse than the left knee.  Assessment & Plan Primary osteoarthritis of right knee (M17.11) Primary osteoarthritis of left hip (M16.12)  Note:Surgical Plans: Left Total Hip Replacement - Anterior Approach  Disposition: Home  PCP: Dr. Charlett Blake  IV TXA  Anesthesia Issues: None, but prefers spinal due to history of Parkinson's  Signed electronically by Ok Edwards III PA-C

## 2016-07-07 NOTE — Anesthesia Postprocedure Evaluation (Signed)
Anesthesia Post Note  Patient: Stephanie [name]  Procedure(s) Performed: Procedure(s) (LRB): LEFT TOTAL HIP ARTHROPLASTY ANTERIOR APPROACH (Left)  Patient location during evaluation: PACU Anesthesia Type: Spinal and MAC Level of consciousness: awake and alert Pain management: pain level controlled Vital Signs Assessment: post-procedure vital signs reviewed and stable Respiratory status: spontaneous breathing and respiratory function stable Cardiovascular status: blood pressure returned to baseline and stable Postop Assessment: spinal receding Anesthetic complications: no    Last Vitals:  Vitals:   07/07/16 1645 07/07/16 1710  BP: (!) 159/79 (!) 176/83  Pulse: 73 72  Resp: 12 14  Temp: 36.3 C 36.4 C    Last Pain:  Vitals:   07/07/16 1645  TempSrc:   PainSc: 4                  Drucilla Cumber,W. EDMOND

## 2016-07-07 NOTE — Transfer of Care (Signed)
Immediate Anesthesia Transfer of Care Note  Patient: Stephanie [name]  Procedure(s) Performed: Procedure(s): LEFT TOTAL HIP ARTHROPLASTY ANTERIOR APPROACH (Left)  Patient Location: PACU  Anesthesia Type:Spinal  Level of Consciousness: awake and alert   Airway & Oxygen Therapy: Patient Spontanous Breathing and Patient connected to face mask oxygen  Post-op Assessment: Report given to RN and Post -op Vital signs reviewed and stable  Post vital signs: Reviewed and stable  Last Vitals:  Vitals:   07/07/16 1125  BP: 140/74  Pulse: 95  Resp: 20  Temp: 36.7 C    Last Pain:  Vitals:   07/07/16 1208  TempSrc:   PainSc: 4       Patients Stated Pain Goal: 4 ([id number]  Complications: No apparent anesthesia complications

## 2016-07-07 NOTE — Discharge Instructions (Addendum)
INSTRUCTIONS AFTER JOINT REPLACEMENT  ° °o Remove items at home which could result in a fall. This includes throw rugs or furniture in walking pathways °o ICE to the affected joint every three hours while awake for 30 minutes at a time, for at least the first 3-5 days, and then as needed for pain and swelling.  Continue to use ice for pain and swelling. You may notice swelling that will progress down to the foot and ankle.  This is normal after surgery.  Elevate your leg when you are not up walking on it.   °o Continue to use the breathing machine you got in the hospital (incentive spirometer) which will help keep your temperature down.  It is common for your temperature to cycle up and down following surgery, especially at night when you are not up moving around and exerting yourself.  The breathing machine keeps your lungs expanded and your temperature down. ° ° °DIET:  As you were doing prior to hospitalization, we recommend a well-balanced diet. ° °DRESSING / WOUND CARE / SHOWERING ° °You may change your dressing every day with sterile gauze.  Please use good hand washing techniques before changing the dressing.  Do not use any lotions or creams on the incision until instructed by your surgeon. ° °ACTIVITY ° °o Increase activity slowly as tolerated, but follow the weight bearing instructions below.   °o No driving for 6 weeks or until further direction given by your physician.  You cannot drive while taking narcotics.  °o No lifting or carrying greater than 10 lbs. until further directed by your surgeon. °o Avoid periods of inactivity such as sitting longer than an hour when not asleep. This helps prevent blood clots.  °o You may return to work once you are authorized by your doctor.  ° ° ° °WEIGHT BEARING  ° °Weight bearing as tolerated with assist device (walker, cane, etc) as directed, use it as long as suggested by your surgeon or therapist, typically at least 4-6 weeks. ° ° °EXERCISES ° °Results after joint  replacement surgery are often greatly improved when you follow the exercise, range of motion and muscle strengthening exercises prescribed by your doctor. Safety measures are also important to protect the joint from further injury. Any time any of these exercises cause you to have increased pain or swelling, decrease what you are doing until you are comfortable again and then slowly increase them. If you have problems or questions, call your caregiver or physical therapist for advice.  ° °Rehabilitation is important following a joint replacement. After just a few days of immobilization, the muscles of the leg can become weakened and shrink (atrophy).  These exercises are designed to build up the tone and strength of the thigh and leg muscles and to improve motion. Often times heat used for twenty to thirty minutes before working out will loosen up your tissues and help with improving the range of motion but do not use heat for the first two weeks following surgery (sometimes heat can increase post-operative swelling).  ° °These exercises can be done on a training (exercise) mat, on the floor, on a table or on a bed. Use whatever works the best and is most comfortable for you.    Use music or television while you are exercising so that the exercises are a pleasant break in your day. This will make your life better with the exercises acting as a break in your routine that you can look forward to.     Perform all exercises about fifteen times, three times per day or as directed.  You should exercise both the operative leg and the other leg as well.  Exercises include:    Quad Sets - Tighten up the muscle on the front of the thigh (Quad) and hold for 5-10 seconds.    Straight Leg Raises - With your knee straight (if you were given a brace, keep it on), lift the leg to 60 degrees, hold for 3 seconds, and slowly lower the leg.  Perform this exercise against resistance later as your leg gets stronger.   Leg Slides:  Lying on your back, slowly slide your foot toward your buttocks, bending your knee up off the floor (only go as far as is comfortable). Then slowly slide your foot back down until your leg is flat on the floor again.   Angel Wings: Lying on your back spread your legs to the side as far apart as you can without causing discomfort.   Hamstring Strength:  Lying on your back, push your heel against the floor with your leg straight by tightening up the muscles of your buttocks.  Repeat, but this time bend your knee to a comfortable angle, and push your heel against the floor.  You may put a pillow under the heel to make it more comfortable if necessary.   A rehabilitation program following joint replacement surgery can speed recovery and prevent re-injury in the future due to weakened muscles. Contact your doctor or a physical therapist for more information on knee rehabilitation.    CONSTIPATION  Constipation is defined medically as fewer than three stools per week and severe constipation as less than one stool per week.  Even if you have a regular bowel pattern at home, your normal regimen is likely to be disrupted due to multiple reasons following surgery.  Combination of anesthesia, postoperative narcotics, change in appetite and fluid intake all can affect your bowels.   YOU MUST use at least one of the following options; they are listed in order of increasing strength to get the job done.  They are all available over the counter, and you may need to use some, POSSIBLY even all of these options:    Drink plenty of fluids (prune juice may be helpful) and high fiber foods Colace 100 mg by mouth twice a day  Senokot for constipation as directed and as needed Dulcolax (bisacodyl), take with full glass of water  Miralax (polyethylene glycol) once or twice a day as needed.  If you have tried all these things and are unable to have a bowel movement in the first 3-4 days after surgery call either your  surgeon or your primary doctor.    If you experience loose stools or diarrhea, hold the medications until you stool forms back up.  If your symptoms do not get better within 1 week or if they get worse, check with your doctor.  If you experience "the worst abdominal pain ever" or develop nausea or vomiting, please contact the office immediately for further recommendations for treatment.   ITCHING:  If you experience itching with your medications, try taking only a single pain pill, or even half a pain pill at a time.  You can also use Benadryl over the counter for itching or also to help with sleep.   TED HOSE STOCKINGS:  Use stockings on both legs until for at least 2 weeks or as directed by physician office. They may be removed at night for  sleeping.  MEDICATIONS:  See your medication summary on the After Visit Summary that nursing will review with you.  You may have some home medications which will be placed on hold until you complete the course of blood thinner medication.  It is important for you to complete the blood thinner medication as prescribed.  PRECAUTIONS:  If you experience chest pain or shortness of breath - call 911 immediately for transfer to the hospital emergency department.   If you develop a fever greater that 101 F, purulent drainage from wound, increased redness or drainage from wound, foul odor from the wound/dressing, or calf pain - CONTACT YOUR SURGEON.                                                   FOLLOW-UP APPOINTMENTS:  If you do not already have a post-op appointment, please call the office for an appointment to be seen by your surgeon.  Guidelines for how soon to be seen are listed in your After Visit Summary, but are typically between 1-4 weeks after surgery.   MAKE SURE YOU:   Understand these instructions.   Get help right away if you are not doing well or get worse.    Thank you for letting us be a part of your medical care team.  It is a privilege  we respect greatly.  We hope these instructions will help you stay on track for a fast and full recovery!   Information on my medicine - XARELTO (Rivaroxaban)  This medication education was reviewed with me or my healthcare representative as part of my discharge preparation.  The pharmacist that spoke with me during my hospital stay was: Altha Harm   Why was Xarelto prescribed for you? Xarelto was prescribed for you to reduce the risk of blood clots forming after orthopedic surgery. The medical term for these abnormal blood clots is venous thromboembolism (VTE).  What do you need to know about xarelto ? Take your Xarelto ONCE DAILY at the same time every day. You may take it either with or without food.  If you have difficulty swallowing the tablet whole, you may crush it and mix in applesauce just prior to taking your dose.  Take Xarelto exactly as prescribed by your doctor and DO NOT stop taking Xarelto without talking to the doctor who prescribed the medication.  Stopping without other VTE prevention medication to take the place of Xarelto may increase your risk of developing a clot.  After discharge, you should have regular check-up appointments with your healthcare provider that is prescribing your Xarelto.    What do you do if you miss a dose? If you miss a dose, take it as soon as you remember on the same day then continue your regularly scheduled once daily regimen the next day. Do not take two doses of Xarelto on the same day.   Important Safety Information A possible side effect of Xarelto is bleeding. You should call your healthcare provider right away if you experience any of the following: ? Bleeding from an injury or your nose that does not stop. ? Unusual colored urine (red or dark brown) or unusual colored stools (red or black). ? Unusual bruising for unknown reasons. ? A serious fall or if you hit your head (even if there is no bleeding).  Some medicines may  interact with Xarelto and might increase your risk of bleeding while on Xarelto. To help avoid this, consult your healthcare provider or pharmacist prior to using any new prescription or non-prescription medications, including herbals, vitamins, non-steroidal anti-inflammatory drugs (NSAIDs) and supplements.  This website has more information on Xarelto: [email or web address]

## 2016-07-07 NOTE — Anesthesia Preprocedure Evaluation (Addendum)
Anesthesia Evaluation  Patient identified by MRN, date of birth, ID band  Reviewed: Allergy & Precautions, H&P , NPO status , Patient's Chart, lab work & pertinent test results  Airway Mallampati: II  TM Distance: >3 FB Neck ROM: Full    Dental no notable dental hx. (+) Teeth Intact, Dental Advisory Given   Pulmonary neg pulmonary ROS, former smoker,    Pulmonary exam normal breath sounds clear to auscultation       Cardiovascular hypertension, Pt. on medications  Rhythm:Regular Rate:Normal     Neuro/Psych Depression  Neuromuscular disease    GI/Hepatic negative GI ROS, Neg liver ROS,   Endo/Other  negative endocrine ROS  Renal/GU negative Renal ROS  negative genitourinary   Musculoskeletal  (+) Arthritis , Osteoarthritis,    Abdominal   Peds  Hematology negative hematology ROS (+)   Anesthesia Other Findings   Reproductive/Obstetrics negative OB ROS                            Anesthesia Physical Anesthesia Plan  ASA: II  Anesthesia Plan: MAC and Spinal   Post-op Pain Management:    Induction: Intravenous  Airway Management Planned: Simple Face Mask  Additional Equipment:   Intra-op Plan:   Post-operative Plan:   Informed Consent: I have reviewed the patients History and Physical, chart, labs and discussed the procedure including the risks, benefits and alternatives for the proposed anesthesia with the patient or authorized representative who has indicated his/her understanding and acceptance.   Dental advisory given  Plan Discussed with: CRNA  Anesthesia Plan Comments:         Anesthesia Quick Evaluation

## 2016-07-08 LAB — BASIC METABOLIC PANEL
ANION GAP: 5 (ref 5–15)
BUN: 14 mg/dL (ref 6–20)
CHLORIDE: 100 mmol/L — AB (ref 101–111)
CO2: 28 mmol/L (ref 22–32)
Calcium: 8.4 mg/dL — ABNORMAL LOW (ref 8.9–10.3)
Creatinine, Ser: 0.45 mg/dL (ref 0.44–1.00)
GFR calc Af Amer: >60 mL/min (ref >60)
GFR calc non Af Amer: >60 mL/min (ref >60)
GLUCOSE: 167 mg/dL — AB (ref 65–99)
POTASSIUM: 3.6 mmol/L (ref 3.5–5.1)
Sodium: 133 mmol/L — ABNORMAL LOW (ref 135–145)

## 2016-07-08 LAB — CBC
HEMATOCRIT: 35.2 % — AB (ref 36.0–46.0)
HEMOGLOBIN: 12.1 g/dL (ref 12.0–15.0)
MCH: 30.2 pg (ref 26.0–34.0)
MCHC: 34.4 g/dL (ref 30.0–36.0)
MCV: 87.8 fL (ref 78.0–100.0)
PLATELETS: 196 10*3/uL (ref 150–400)
RBC: 4.01 MIL/uL (ref 3.87–5.11)
RDW: 13 % (ref 11.5–15.5)
WBC: 11 10*3/uL — ABNORMAL HIGH (ref 4.0–10.5)

## 2016-07-08 NOTE — Evaluation (Signed)
Physical Therapy Evaluation Patient Details Name: Stephanie [name] MRN: [id number] DOB: [birth year] Today's Date: 07/08/2016   History of Present Illness  s/p LEFT TOTAL HIP ARTHROPLASTY ANTERIOR APPROACH (Left)  Clinical Impression  The patient is mobilizing well today. Plans for DC tomorrow. Pt admitted with above diagnosis. Pt currently with functional limitations due to the deficits listed below (see PT Problem List). Pt will benefit from skilled PT to increase their independence and safety with mobility to allow discharge to the venue listed below.        Follow Up Recommendations Home health PT;Supervision/Assistance - 24 hour    Equipment Recommendations  None recommended by PT    Recommendations for Other Services       Precautions / Restrictions Precautions Precautions: Fall Restrictions Weight Bearing Restrictions: No Other Position/Activity Restrictions: WBAT      Mobility  Bed Mobility Overal bed mobility: Needs Assistance Bed Mobility: Supine to Sit;Sit to Supine     Supine to sit: Mod assist Sit to supine: Mod assist   General bed mobility comments: asisst for LLEonto bed, does bes with both legs together, assist with legs off of the bed.  Transfers Overall transfer level: Needs assistance Equipment used: Rolling walker (2 wheeled) Transfers: Sit to/from Stand Sit to Stand: Min guard         General transfer comment: cues UE/LE placement  Ambulation/Gait Ambulation/Gait assistance: Min guard Ambulation Distance (Feet): 125 Feet Assistive device: Rolling walker (2 wheeled) Gait Pattern/deviations: Step-to pattern;Step-through pattern     General Gait Details: cues for sequence and position inside the RW  Stairs            Wheelchair Mobility    Modified Rankin (Stroke Patients Only)       Balance                                             Pertinent Vitals/Pain Pain Assessment: 0-10 Pain Score: 4  Pain  Location: lt hip Pain Descriptors / Indicators: Aching;Sore Pain Intervention(s): Limited activity within patient's tolerance;Premedicated before session;Ice applied    Home Living Family/patient expects to be discharged to:: Private residence Living Arrangements: Spouse/significant other Available Help at Discharge: Family;Home health;Personal care attendant Type of Home: House Home Access: Stairs to enter Entrance Stairs-Rails: Left Entrance Stairs-Number of Steps: 4 Home Layout: One level Home Equipment: Walker - 2 wheels;Toilet riser;Grab bars - tub/shower Additional Comments: has hired 24 hr caregivers for spouse, daughter to stay x 4 days    Prior Function Level of Independence: Independent               Hand Dominance        Extremity/Trunk Assessment   Upper Extremity Assessment: Overall WFL for tasks assessed           Lower Extremity Assessment: LLE deficits/detail   LLE Deficits / Details: advances the  LLE     Communication   Communication: No difficulties  Cognition Arousal/Alertness: Awake/alert Behavior During Therapy: WFL for tasks assessed/performed Overall Cognitive Status: Within Functional Limits for tasks assessed                      General Comments      Exercises Total Joint Exercises Ankle Circles/Pumps: AROM;Both;10 reps;Supine Short Arc Quad: AROM;Left;10 reps;Supine Heel Slides: AAROM;Left;10 reps;Supine Hip ABduction/ADduction: AAROM;Left;10 reps;Supine      Assessment/Plan  PT Assessment Patient needs continued PT services  PT Diagnosis Difficulty walking;Acute pain   PT Problem List Decreased strength;Decreased range of motion;Decreased activity tolerance;Decreased mobility;Decreased knowledge of precautions;Decreased safety awareness;Decreased knowledge of use of DME;Pain  PT Treatment Interventions DME instruction;Gait training;Stair training;Functional mobility training;Therapeutic activities;Therapeutic  exercise   PT Goals (Current goals can be found in the Care Plan section) Acute Rehab PT Goals Patient Stated Goal: home tomorrow PT Goal Formulation: With patient Time For Goal Achievement: 07/10/16 Potential to Achieve Goals: Good    Frequency 7X/week   Barriers to discharge        Co-evaluation               End of Session   Activity Tolerance: Patient tolerated treatment well Patient left:  (with OT) Nurse Communication: Mobility status         Time: 1000-1048 PT Time Calculation (min) (ACUTE ONLY): 48 min   Charges: [employer], exercise       PT G CodesClaretha Cooper 07/08/2016, 1:08 PM Tresa Endo PT [phone]

## 2016-07-08 NOTE — Care Management Note (Signed)
Case Management Note  Patient Details  Name: Stephanie [name] MRN: [id number] Date of Birth: October 14, 1932  Subjective/Objective:                  LEFT TOTAL HIP ARTHROPLASTY ANTERIOR APPROACH (Left) Action/Plan: Discharge planning Expected Discharge Date:  07/09/16               Expected Discharge Plan:  [place]  In-House Referral:     Discharge planning Services  CM Consult  Post Acute Care Choice:  Home Health Choice offered to:  Patient  DME Arranged:  N/A DME Agency:  NA  HH Arranged:  PT [place] Agency:  [facility] ([facility])  Status of Service:  Completed, signed off  If discussed at [employer], dates discussed:    Additional Comments: CM met with pt in room to offer choice of home health agency.  Pt chooses [facility] to render HHPT.  Referrral given to [facility] rep, Tim.  Pt has rolling walker and 3n1 at home.  No other CM needs were communicated. Dellie Catholic, RN 07/08/2016, 1:47 PM

## 2016-07-08 NOTE — Progress Notes (Signed)
Physical Therapy Treatment Patient Details Name: Stephanie [name] MRN: [id number] DOB: 1932-01-03 Today's Date: 07/08/2016    History of Present Illness s/p LEFT TOTAL HIP ARTHROPLASTY ANTERIOR APPROACH (Left)    PT Comments    Patient reports feeling very sleepy and has slept better than prior to surgery. Plans for DC I AM after PT.  Follow Up Recommendations  Home health PT;Supervision/Assistance - 24 hour     Equipment Recommendations  None recommended by PT    Recommendations for Other Services       Precautions / Restrictions Precautions Precautions: Fall Restrictions Weight Bearing Restrictions: No Other Position/Activity Restrictions: WBAT    Mobility  Bed Mobility Overal bed mobility: Needs Assistance Bed Mobility: Supine to Sit;Sit to Supine     Supine to sit: Min assist Sit to supine: Min assist   General bed mobility comments: assist legs off of bed, patient rolled to right side  to get to edge and sitting, assist both legs back onto the bed. Practiced getting off of high bed with a platform similar to  what daughter has ordered.  Used the step stool to push back onto bed  then assisted legs onto the  Transfers Overall transfer level: Needs assistance Equipment used: Rolling walker (2 wheeled) Transfers: Sit to/from Stand Sit to Stand: Supervision         General transfer comment: cues UE/LE placement  Ambulation/Gait Ambulation/Gait assistance: Supervision Ambulation Distance (Feet): 20 Feet (x 2) Assistive device: Rolling walker (2 wheeled) Gait Pattern/deviations: Step-through pattern     General Gait Details: cues for sequence and position inside the RW   Stairs            Wheelchair Mobility    Modified Rankin (Stroke Patients Only)       Balance                                    Cognition Arousal/Alertness: Awake/alert Behavior During Therapy: WFL for tasks assessed/performed Overall Cognitive Status:  Within Functional Limits for tasks assessed                      Exercises Total Joint Exercises Ankle Circles/Pumps: AROM;Both;10 reps;Supine Short Arc Quad: AROM;Left;10 reps;Supine Heel Slides: AAROM;Left;10 reps;Supine Hip ABduction/ADduction: AAROM;Left;10 reps;Supine    General Comments        Pertinent Vitals/Pain Pain Assessment: 0-10 Pain Score: 4  Pain Location: Lt thigh Pain Descriptors / Indicators: Tightness;Discomfort;Sore Pain Intervention(s): Limited activity within patient's tolerance;Premedicated before session;Ice applied    Home Living Family/patient expects to be discharged to:: Private residence Living Arrangements: Spouse/significant other Available Help at Discharge: Family;Home health;Personal care attendant Type of Home: House Home Access: Stairs to enter Entrance Stairs-Rails: Left Home Layout: One level Home Equipment: Environmental consultant - 2 wheels;Toilet riser;Grab bars - tub/shower Additional Comments: has hired 24 hr caregivers for spouse, daughter to stay x 4 days    Prior Function Level of Independence: Independent          PT Goals (current goals can now be found in the care plan section) Acute Rehab PT Goals Patient Stated Goal: home tomorrow PT Goal Formulation: With patient Time For Goal Achievement: 07/10/16 Potential to Achieve Goals: Good Progress towards PT goals: Progressing toward goals    Frequency  7X/week    PT Plan Current plan remains appropriate    Co-evaluation  End of Session   Activity Tolerance: Patient tolerated treatment well Patient left: in bed;with call bell/phone within reach;with chair alarm set;with family/visitor present     Time: [id number] PT Time Calculation (min) (ACUTE ONLY): 25 min  Charges:  $Gait Training: 8-22 mins $Self Care/Home Management: 8-22                    G Codes:      Claretha Cooper 07/08/2016, 3:59 PM

## 2016-07-08 NOTE — Progress Notes (Signed)
   Subjective: 1 Day Post-Op Procedure(s) (LRB): LEFT TOTAL HIP ARTHROPLASTY ANTERIOR APPROACH (Left) Patient reports pain as mild.   We will start therapy today.  Plan is to go Home after hospital stay.  Objective: Vital signs in last 24 hours: Temp:  [97.4 F (36.3 C)-98.5 F (36.9 C)] 97.5 F (36.4 C) (08/24 0537) Pulse Rate:  [67-96] 67 (08/24 0537) Resp:  [11-20] 16 (08/24 0537) BP: (122-176)/(60-86) 129/60 (08/24 0537) SpO2:  [97 %-100 %] 100 % (08/24 0537) Weight:  [53.1 kg (117 lb)-54.4 kg (120 lb)] 53.1 kg (117 lb) (08/23 1710)  Intake/Output from previous day:  Intake/Output Summary (Last 24 hours) at 07/08/16 0648 Last data filed at 07/08/16 0540  Gross per 24 hour  Intake             3925 ml  Output             1925 ml  Net             2000 ml    Intake/Output this shift: Total I/O In: 1475 [P.O.:600; I.V.:875] Out: 1420 [Urine:1300; Drains:120]  Labs:  Recent Labs  07/08/16 0429  HGB 12.1    Recent Labs  07/08/16 0429  WBC 11.0*  RBC 4.01  HCT 35.2*  PLT 196    Recent Labs  07/08/16 0429  NA 133*  K 3.6  CL 100*  CO2 28  BUN 14  CREATININE 0.45  GLUCOSE 167*  CALCIUM 8.4*   No results for input(s): LABPT, INR in the last 72 hours.  EXAM General - Patient is Alert, Appropriate and Oriented Extremity - Neurologically intact Neurovascular intact No cellulitis present Compartment soft Dressing - dressing C/D/I Motor Function - intact, moving foot and toes well on exam.  Hemovac pulled without difficulty.  Past Medical History:  Diagnosis Date  . Back pain    due to osteoarthritis  . Benign mole    right hcets mole, bleeds when dried with towel  . Dry scalp   . H/O measles   . H/O mumps   . History of chicken pox   . History of kidney stones    yrs ago  . Hypertension   . Low back pain 01/18/2016  . OA (osteoarthritis)    [facility]  . Parkinson disease ([place]) 2012  . Preventative  health care 01/18/2016  . Situational stress   . Vaginitis and vulvovaginitis 01/18/2016  . Vertigo    took antivert for a couple days    Assessment/Plan: 1 Day Post-Op Procedure(s) (LRB): LEFT TOTAL HIP ARTHROPLASTY ANTERIOR APPROACH (Left) Principal Problem:   OA (osteoarthritis) of hip   Advance diet Up with therapy D/C IV fluids Plan for discharge tomorrow  DVT Prophylaxis - Xarelto Weight Bearing As Tolerated left Leg Hemovac Pulled Begin Therapy  Gearlean Alf

## 2016-07-08 NOTE — Progress Notes (Signed)
Occupational Therapy Evaluation Patient Details Name: Stephanie [name] MRN: [id number] DOB: 05/10/1932 Today's Date: 07/08/2016    History of Present Illness s/p LEFT TOTAL HIP ARTHROPLASTY ANTERIOR APPROACH (Left)   Clinical Impression   Patient presents to OT with decreased ADL independence s/p L THA, AA. Will benefit from skilled OT to maximize function and to facilitate a safe discharge. OT will follow.    Follow Up Recommendations  No OT follow up;Supervision/Assistance - 24 hour    Equipment Recommendations  None recommended by OT    Recommendations for Other Services       Precautions / Restrictions Precautions Precautions: Fall Restrictions Weight Bearing Restrictions: No Other Position/Activity Restrictions: WBAT      Mobility Bed Mobility               General bed mobility comments: NT -- up in room with PT  Transfers Overall transfer level: Needs assistance Equipment used: Rolling walker (2 wheeled) Transfers: Sit to/from Stand Sit to Stand: Min guard         General transfer comment: cues UE/LE placement    Balance                                            ADL Overall ADL's : Needs assistance/impaired Eating/Feeding: Independent;Sitting   Grooming: Wash/dry hands;Min guard;Standing   Upper Body Bathing: Set up;Sitting   Lower Body Bathing: Moderate assistance;Sit to/from stand   Upper Body Dressing : Set up;Sitting   Lower Body Dressing: Moderate assistance;Sit to/from stand   Toilet Transfer: Min guard;Ambulation;BSC;RW   Toileting- Water quality scientist and Hygiene: Min guard;Sit to/from stand       Functional mobility during ADLs: Min guard;Rolling walker General ADL Comments: Family can assist with LB self-care as needed. Patient has grab bars in walk-in shower. Will practice transfer next session per pt's request. Educated to have family assist with showers initially. Patient verbalized understanding.      Vision     Perception     Praxis      Pertinent Vitals/Pain Pain Assessment: 0-10 Pain Score: 3  Pain Location: L hip Pain Descriptors / Indicators: Aching;Sore Pain Intervention(s): Monitored during session;Repositioned;Ice applied     Hand Dominance     Extremity/Trunk Assessment Upper Extremity Assessment Upper Extremity Assessment: Overall WFL for tasks assessed   Lower Extremity Assessment Lower Extremity Assessment: Defer to PT evaluation       Communication Communication Communication: No difficulties   Cognition Arousal/Alertness: Awake/alert Behavior During Therapy: WFL for tasks assessed/performed Overall Cognitive Status: Within Functional Limits for tasks assessed                     General Comments       Exercises       Shoulder Instructions      Home Living Family/patient expects to be discharged to:: Private residence Living Arrangements: Spouse/significant other Available Help at Discharge: Family Type of Home: House             Bathroom Shower/Tub: Walk-in shower   Bathroom Toilet: Handicapped height Bathroom Accessibility: Yes How Accessible: Accessible via walker Home Equipment: Environmental consultant - 2 wheels;Toilet riser;Grab bars - tub/shower          Prior Functioning/Environment Level of Independence: Independent             OT Diagnosis: Acute pain   OT Problem  List: Decreased strength;Decreased range of motion;Decreased knowledge of use of DME or AE;Pain   OT Treatment/Interventions: Self-care/ADL training;DME and/or AE instruction;Therapeutic activities;Patient/family education    OT Goals(Current goals can be found in the care plan section) Acute Rehab OT Goals Patient Stated Goal: home tomorrow OT Goal Formulation: With patient Time For Goal Achievement: 07/22/16 Potential to Achieve Goals: Good ADL Goals Pt Will Perform Lower Body Bathing: with supervision;sit to/from stand Pt Will Perform Lower Body  Dressing: with supervision;sit to/from stand Pt Will Perform Tub/Shower Transfer: Shower transfer;with min guard assist;grab bars;rolling walker  OT Frequency: Min 2X/week   Barriers to D/C:            Co-evaluation              End of Session Equipment Utilized During Treatment: Surveyor, mining Communication: Mobility status  Activity Tolerance: Patient tolerated treatment well Patient left: in chair;with call bell/phone within reach;with chair alarm set   Time: [id number] OT Time Calculation (min): 12 min Charges:  OT General Charges $OT Visit: 1 Procedure OT Evaluation $OT Eval Low Complexity: 1 Procedure G-Codes:    [name] A July 31, 2016, 12:44 PM

## 2016-07-09 LAB — CBC
HCT: 34.1 % — ABNORMAL LOW (ref 36.0–46.0)
HEMOGLOBIN: 11.8 g/dL — AB (ref 12.0–15.0)
MCH: 30.1 pg (ref 26.0–34.0)
MCHC: 34.6 g/dL (ref 30.0–36.0)
MCV: 87 fL (ref 78.0–100.0)
PLATELETS: 185 10*3/uL (ref 150–400)
RBC: 3.92 MIL/uL (ref 3.87–5.11)
RDW: 12.9 % (ref 11.5–15.5)
WBC: 11.9 10*3/uL — AB (ref 4.0–10.5)

## 2016-07-09 LAB — BASIC METABOLIC PANEL
ANION GAP: 6 (ref 5–15)
BUN: 14 mg/dL (ref 6–20)
CALCIUM: 8.6 mg/dL — AB (ref 8.9–10.3)
CO2: 30 mmol/L (ref 22–32)
CREATININE: 0.38 mg/dL — AB (ref 0.44–1.00)
Chloride: 95 mmol/L — ABNORMAL LOW (ref 101–111)
GFR calc Af Amer: >60 mL/min (ref >60)
GLUCOSE: 122 mg/dL — AB (ref 65–99)
POTASSIUM: 3.3 mmol/L — AB (ref 3.5–5.1)
SODIUM: 131 mmol/L — AB (ref 135–145)

## 2016-07-09 NOTE — Progress Notes (Signed)
   Subjective: 2 Days Post-Op Procedure(s) (LRB): LEFT TOTAL HIP ARTHROPLASTY ANTERIOR APPROACH (Left) Patient reports pain as mild.   Patient seen in rounds with Dr. Wynelle Link. Patient is well, and has had no acute complaints or problems other than discomfort in the hip when getting in and out of bed. No SOB or chest pain. Voiding well.    Objective: Vital signs in last 24 hours: Temp:  [97.9 F (36.6 C)-98.8 F (37.1 C)] 98.7 F (37.1 C) (08/25 0515) Pulse Rate:  [65-77] 77 (08/25 0515) Resp:  [16] 16 (08/25 0515) BP: (138-154)/(52-67) 141/61 (08/25 0515) SpO2:  [97 %-99 %] 97 % (08/25 0515)  Intake/Output from previous day:  Intake/Output Summary (Last 24 hours) at 07/09/16 0735 Last data filed at 07/09/16 0600  Gross per 24 hour  Intake          1203.25 ml  Output             2075 ml  Net          -871.75 ml     Labs:  Recent Labs  07/08/16 0429 07/09/16 0415  HGB 12.1 11.8*    Recent Labs  07/08/16 0429 07/09/16 0415  WBC 11.0* 11.9*  RBC 4.01 3.92  HCT 35.2* 34.1*  PLT 196 185    Recent Labs  07/08/16 0429 07/09/16 0415  NA 133* 131*  K 3.6 3.3*  CL 100* 95*  CO2 28 30  BUN 14 14  CREATININE 0.45 0.38*  GLUCOSE 167* 122*  CALCIUM 8.4* 8.6*    EXAM General - Patient is Alert and Oriented Extremity - Neurologically intact Intact pulses distally Dorsiflexion/Plantar flexion intact Compartment soft Dressing/Incision - clean, dry, no drainage Motor Function - intact, moving foot and toes well on exam.   Past Medical History:  Diagnosis Date  . Back pain    due to osteoarthritis  . Benign mole    right hcets mole, bleeds when dried with towel  . Dry scalp   . H/O measles   . H/O mumps   . History of chicken pox   . History of kidney stones    yrs ago  . Hypertension   . Low back pain 01/18/2016  . OA (osteoarthritis)    [facility]  . Parkinson disease ([place]) 2012  . Preventative health care 01/18/2016   . Situational stress   . Vaginitis and vulvovaginitis 01/18/2016  . Vertigo    took antivert for a couple days    Assessment/Plan: 2 Days Post-Op Procedure(s) (LRB): LEFT TOTAL HIP ARTHROPLASTY ANTERIOR APPROACH (Left) Principal Problem:   OA (osteoarthritis) of hip  Estimated body mass index is 19.77 kg/m as calculated from the following:   Height as of this encounter: 5' 4.5" (1.638 m).   Weight as of this encounter: 53.1 kg (117 lb). Advance diet Up with therapy D/C IV fluids Discharge home with home health  DVT Prophylaxis - Xarelto Weight Bearing As Tolerated   Continue with therapy for one session this morning. DC home midday with HHPT.   Ardeen Jourdain, PA-C Orthopaedic Surgery 07/09/2016, 7:35 AM

## 2016-07-09 NOTE — Progress Notes (Signed)
Occupational Therapy Treatment Patient Details Name: Stephanie [name] MRN: [id number] DOB: 1932-10-18 Today's Date: 07/09/2016    History of present illness s/p LEFT TOTAL HIP ARTHROPLASTY ANTERIOR APPROACH (Left)   OT comments  All OT education completed and pt questions answered. No further OT needs at this time. Will sign off.  Follow Up Recommendations  No OT follow up;Supervision/Assistance - 24 hour    Equipment Recommendations  None recommended by OT    Recommendations for Other Services      Precautions / Restrictions Precautions Precautions: Fall Restrictions Weight Bearing Restrictions: No Other Position/Activity Restrictions: WBAT       Mobility Bed Mobility               General bed mobility comments: NT -- up in recliner  Transfers Overall transfer level: Needs assistance Equipment used: Rolling walker (2 wheeled) Transfers: Sit to/from Stand Sit to Stand: Supervision              Balance                                   ADL Overall ADL's : Needs assistance/impaired     Grooming: Wash/dry hands;Wash/dry face;Oral care;Supervision/safety           Upper Body Dressing : Set up;Sitting   Lower Body Dressing: Minimal assistance;Sit to/from stand   Toilet Transfer: Supervision/safety;Ambulation;BSC;RW   Toileting- Clothing Manipulation and Hygiene: Supervision/safety;Sit to/from stand   Tub/ Shower Transfer: Walk-in shower;Min guard;Ambulation;Rolling walker;Grab bars   Functional mobility during ADLs: Supervision/safety;Rolling walker General ADL Comments: Patient practiced toilet transfer/toileting, walk-in shower transfer, grooming in standing at sink, and getting dressed in personal clothing. All education completed and no further OT needs. Will sign off.      Vision                     Perception     Praxis      Cognition   Behavior During Therapy: WFL for tasks assessed/performed Overall  Cognitive Status: Within Functional Limits for tasks assessed                       Extremity/Trunk Assessment               Exercises     Shoulder Instructions       General Comments      Pertinent Vitals/ Pain       Pain Assessment: No/denies pain  Home Living                                          Prior Functioning/Environment              Frequency       Progress Toward Goals  OT Goals(current goals can now be found in the care plan section)  Progress towards OT goals: Goals met/education completed, patient discharged from [place] All goals met and education completed, patient discharged from OT services    Co-evaluation                 End of Session Equipment Utilized During Treatment: Rolling walker   Activity Tolerance Patient tolerated treatment well   Patient Left in chair;with call bell/phone within reach;with chair alarm set   Nurse  Communication Mobility status        Time: 7944-4619 OT Time Calculation (min): 26 min  Charges: OT General Charges $OT Visit: 1 Procedure OT Treatments $Self Care/Home Management : 23-37 mins  Stephanie [name] A 07/09/2016, 9:38 AM

## 2016-07-09 NOTE — Progress Notes (Signed)
Physical Therapy Treatment Patient Details Name: Stephanie [name] MRN: [id number] DOB: [birth year] Today's Date: 07/09/2016    History of Present Illness s/p LEFT TOTAL HIP ARTHROPLASTY ANTERIOR APPROACH (Left)    PT Comments    Patient is readyfor DC. Instructions completed.   Follow Up Recommendations  Home health PT;Supervision/Assistance - 24 hour     Equipment Recommendations  None recommended by PT    Recommendations for Other Services       Precautions / Restrictions Precautions Precautions: Fall Restrictions Weight Bearing Restrictions: No Other Position/Activity Restrictions: WBAT    Mobility  Bed Mobility   Bed Mobility: Sit to Supine       Sit to supine: Min assist   General bed mobility comments: assist the legs onto bed  Transfers Overall transfer level: Needs assistance Equipment used: Rolling walker (2 wheeled) Transfers: Sit to/from Stand Sit to Stand: Supervision            Ambulation/Gait Ambulation/Gait assistance: Supervision   Assistive device: Rolling walker (2 wheeled) Gait Pattern/deviations: Step-through pattern;Antalgic     General Gait Details: cues for sequence and position inside the RW   Stairs Stairs: Yes Stairs assistance: Min guard Stair Management: One rail Left;Step to pattern;Sideways Number of Stairs: 4 General stair comments: cues for technique  Wheelchair Mobility    Modified Rankin (Stroke Patients Only)       Balance                                    Cognition Arousal/Alertness: Awake/alert Behavior During Therapy: WFL for tasks assessed/performed Overall Cognitive Status: Within Functional Limits for tasks assessed                      Exercises Total Joint Exercises Ankle Circles/Pumps: AROM;Both;10 reps;Supine Quad Sets: AROM;Both;10 reps Short Arc Quad: AROM;Left;10 reps;Supine Heel Slides: AAROM;Left;10 reps;Supine Hip ABduction/ADduction: AAROM;Left;10  reps;Supine    General Comments        Pertinent Vitals/Pain Pain Assessment: No/denies pain Pain Score: 5  Pain Location: lt thigh Pain Descriptors / Indicators: Aching;Tightness Pain Intervention(s): Limited activity within patient's tolerance;Monitored during session;Premedicated before session;Repositioned;Patient requesting pain meds-RN notified;Ice applied    Home Living                      Prior Function            PT Goals (current goals can now be found in the care plan section) Progress towards PT goals: Progressing toward goals    Frequency       PT Plan Current plan remains appropriate    Co-evaluation             End of Session   Activity Tolerance: Patient tolerated treatment well Patient left: in bed;with call bell/phone within reach;with bed alarm set     Time: 1032-1105 PT Time Calculation (min) (ACUTE ONLY): 33 min  Charges:  $Gait Training: 8-22 mins $Therapeutic Exercise: 8-22 mins                    G Codes:      Stephanie [name] 07/09/2016, 11:35 AM

## 2016-07-10 DIAGNOSIS — M47812 Spondylosis without myelopathy or radiculopathy, cervical region: Secondary | ICD-10-CM | POA: Diagnosis not present

## 2016-07-10 DIAGNOSIS — G20 Parkinson's disease: Secondary | ICD-10-CM | POA: Diagnosis not present

## 2016-07-10 DIAGNOSIS — M170 Bilateral primary osteoarthritis of knee: Secondary | ICD-10-CM | POA: Diagnosis not present

## 2016-07-10 DIAGNOSIS — M4314 Spondylolisthesis, thoracic region: Secondary | ICD-10-CM | POA: Diagnosis not present

## 2016-07-10 DIAGNOSIS — M5416 Radiculopathy, lumbar region: Secondary | ICD-10-CM | POA: Diagnosis not present

## 2016-07-10 DIAGNOSIS — Z471 Aftercare following joint replacement surgery: Secondary | ICD-10-CM | POA: Diagnosis not present

## 2016-07-11 NOTE — Assessment & Plan Note (Signed)
Well controlled, no changes to meds. Encouraged heart healthy diet such as the DASH diet and exercise as tolerated.  °

## 2016-07-11 NOTE — Assessment & Plan Note (Signed)
Scheduled for surgery with ortho later this month for THR and is anxious to proceed due to level of pain. She is in good health and able to proceed with surgery.

## 2016-07-11 NOTE — Assessment & Plan Note (Signed)
Encouraged heart healthy diet, increase exercise, avoid trans fats, consider a krill oil cap daily 

## 2016-07-11 NOTE — Assessment & Plan Note (Signed)
Following with neurology. stable

## 2016-07-11 NOTE — Progress Notes (Signed)
Patient ID: Stephanie [name], female   DOB: 05/12/1932, 82 y.o.   MRN: [id number]   Subjective:    Patient ID: Stephanie [name], female    DOB: 09-14-1932, 82 y.o.   MRN: [id number]  Chief Complaint  Patient presents with  . Other    surgical clearance.   . Nausea    patient report feeling nausous for a 1 wk, mole on upper right chest continue to bleed, and skin has been dry and flaky.    HPI Patient is in today for follow up and in preparation of her THR later this month. Her pain is severe and debilitating and she is ready to proceed despite the risk. She otherwise feels well today. No recent illness or acute concerns. Denies CP/palp/SOB/HA/congestion/fevers/GI or GU c/o. Taking meds as prescribed  Past Medical History:  Diagnosis Date  . Back pain    due to osteoarthritis  . Benign mole    right hcets mole, bleeds when dried with towel  . Dry scalp   . H/O measles   . H/O mumps   . History of chicken pox   . History of kidney stones    yrs ago  . Hypertension   . Low back pain 01/18/2016  . OA (osteoarthritis)    [facility]  . Parkinson disease ([place]) 2012  . Preventative health care 01/18/2016  . Situational stress   . Vaginitis and vulvovaginitis 01/18/2016  . Vertigo    took antivert for a couple days    Past Surgical History:  Procedure Laterality Date  . BIOPSY THYROID     benign  . CATARACT EXTRACTION Bilateral   . CYSTECTOMY     wrist  . DILATION AND CURETTAGE OF UTERUS    . HAND SURGERY Right    MCP joint for arthritis, prosthesis placed and removed  . LAPAROSCOPY     secondary infertility   . TONSILLECTOMY AND ADENOIDECTOMY    . TOTAL HIP ARTHROPLASTY Left 07/07/2016   Procedure: LEFT TOTAL HIP ARTHROPLASTY ANTERIOR APPROACH;  Surgeon: Gaynelle Arabian, MD;  Location: [facility];  Service: Orthopedics;  Laterality: Left;  . wrist cyst     right    Family History  Problem Relation Age of Onset  . Arthritis Mother   .  Hypertension Mother   . Deep vein thrombosis Mother     recurrent, secondary to Bleeding disorder  . Arthritis Father   . Stroke Father   . Diabetes Sister   . Heart disease Sister   . Obesity Sister   . Arthritis Sister   . Hypertension Sister   . Heart disease Maternal Grandmother   . Heart disease Maternal Grandfather   . Peripheral vascular disease Paternal Grandmother     s/p leg amputation  . Stroke Paternal Grandfather     Social History   Social History  . Marital status: Married    Spouse name: N/A  . Number of children: N/A  . Years of education: N/A   Occupational History  . Not on file.   Social History Main Topics  . Smoking status: Former Smoker    Quit date: 05/04/1966  . Smokeless tobacco: Never Used  . Alcohol use 2.0 oz/week    4 Standard drinks or equivalent per week     Comment: social  . Drug use: No  . Sexual activity: No     Comment: lives with husband who has Parkinson's, Pharmacist, hospital and retail retired, no dietary  restrictions.   Other Topics Concern  . Not on file   Social History Narrative  . No narrative on file    Outpatient Medications Prior to Visit  Medication Sig Dispense Refill  . carbidopa-levodopa (SINEMET IR) 25-100 MG per tablet Take 1 tablet by mouth 3 (three) times daily.     Marland Kitchen EPINEPHrine 0.3 mg/0.3 mL IJ SOAJ injection Inject 0.3 mLs (0.3 mg total) into the muscle once. (Patient taking differently: Inject 0.3 mg into the muscle once as needed (allergic reaction). ) 2 Device 2  . estradiol (ESTRACE) 0.5 MG tablet TAKE 1/2 TABLET BY MOUTH EVERY DAY 45 tablet 3  . ketoconazole (NIZORAL) 2 % shampoo Apply 1 application topically every other day. Twice month    . medroxyPROGESTERone (PROVERA) 2.5 MG tablet TAKE 1/2 TABLET(1.25 MG) BY MOUTH DAILY 45 tablet 3  . Polyethyl Glycol-Propyl Glycol (SYSTANE) 0.4-0.3 % SOLN Place 1 drop into both eyes daily.     . Biotin (BIOTIN 5000) 5 MG CAPS Take 1 capsule by mouth daily.    .  Calcium-Vitamin D-Vitamin K (CALCIUM + D + K PO) Take 600 mg by mouth daily.      . Cholecalciferol (VITAMIN D3) 2000 UNITS TABS Take by mouth daily.      . Coenzyme Q10 (COQ10 PO) Take by mouth daily.      . diazepam (VALIUM) 5 MG tablet TAKE 1/2 TABLET BY MOUTH TWICE DAILY AS NEEDED FOR ANXIETY (Patient taking differently: TAKE 1/2 TABLET BY MOUTH AT BEDTIME) 30 tablet 0  . diclofenac (VOLTAREN) 50 MG EC tablet Take 50 mg by mouth 2 (two) times daily. Reported on 01/12/2016    . hydrochlorothiazide (HYDRODIURIL) 25 MG tablet TAKE 1 TABLET BY MOUTH EVERY DAY 90 tablet 0  . HYDROcodone-acetaminophen (NORCO/VICODIN) 5-325 MG tablet Take 1 to 2 at night as needed (Patient taking differently: Take 1 tablet by mouth every 6 (six) hours as needed for moderate pain. ) 20 tablet 0  . Multiple Vitamin (MULTIVITAMIN) tablet Take 1 tablet by mouth daily.      . Probiotic Product (PROBIOTIC DAILY PO) Take 1 tablet by mouth daily.    . sertraline (ZOLOFT) 25 MG tablet TAKE 1 TABLET BY MOUTH EVERY DAY 90 tablet 0   No facility-administered medications prior to visit.     Allergies  Allergen Reactions  . Bee Venom Swelling    Yellow jackets, white and black face hornets, local swelling only  . Codeine Nausea And Vomiting  . Penicillins Itching and Swelling  . Lodine [Etodolac] Rash  . Tramadol Itching and Rash    Review of Systems  Constitutional: Negative for fever and malaise/fatigue.  HENT: Negative for congestion.   Eyes: Negative for blurred vision.  Respiratory: Negative for shortness of breath.   Cardiovascular: Negative for chest pain, palpitations and leg swelling.  Gastrointestinal: Negative for abdominal pain, blood in stool and nausea.  Genitourinary: Negative for dysuria and frequency.  Musculoskeletal: Positive for joint pain. Negative for falls.  Skin: Negative for rash.  Neurological: Negative for dizziness, loss of consciousness and headaches.  Endo/Heme/Allergies: Negative for  environmental allergies.  Psychiatric/Behavioral: Negative for depression. The patient is not nervous/anxious.        Objective:    Physical Exam  Constitutional: She is oriented to person, place, and time. She appears well-developed and well-nourished. No distress.  HENT:  Head: Normocephalic and atraumatic.  Nose: Nose normal.  Eyes: Right eye exhibits no discharge. Left eye exhibits no discharge.  Neck:  Normal range of motion. Neck supple.  Cardiovascular: Normal rate and regular rhythm.   No murmur heard. Pulmonary/Chest: Effort normal and breath sounds normal.  Abdominal: Soft. Bowel sounds are normal. There is no tenderness.  Musculoskeletal: She exhibits no edema.  Neurological: She is alert and oriented to person, place, and time.  Skin: Skin is warm and dry.  Psychiatric: She has a normal mood and affect.  Nursing note and vitals reviewed.   BP 132/74 (BP Location: Right Arm, Patient Position: Sitting, Cuff Size: Normal)   Pulse 93   Temp 98 F (36.7 C) (Oral)   Resp 16   Ht 5\' 4"  (1.626 m)   Wt 120 lb 6.4 oz (54.6 kg)   LMP 01/13/2013 Comment: spotting-had benign endometrial biopsy   SpO2 98%   BMI 20.67 kg/m  Wt Readings from Last 3 Encounters:  07/07/16 117 lb (53.1 kg)  06/29/16 120 lb (54.4 kg)  06/29/16 120 lb 6.4 oz (54.6 kg)     Lab Results  Component Value Date   WBC 11.9 (H) 07/09/2016   HGB 11.8 (L) 07/09/2016   HCT 34.1 (L) 07/09/2016   PLT 185 07/09/2016   GLUCOSE 122 (H) 07/09/2016   CHOL 186 06/11/2015   TRIG 117.0 06/11/2015   HDL 46.00 06/11/2015   LDLDIRECT 120.0 12/11/2014   LDLCALC 117 (H) 06/11/2015   ALT 21 06/29/2016   AST 35 06/29/2016   NA 131 (L) 07/09/2016   K 3.3 (L) 07/09/2016   CL 95 (L) 07/09/2016   CREATININE 0.38 (L) 07/09/2016   BUN 14 07/09/2016   CO2 30 07/09/2016   TSH 1.06 12/11/2014   INR 0.96 06/29/2016    Lab Results  Component Value Date   TSH 1.06 12/11/2014   Lab Results  Component Value Date     WBC 11.9 (H) 07/09/2016   HGB 11.8 (L) 07/09/2016   HCT 34.1 (L) 07/09/2016   MCV 87.0 07/09/2016   PLT 185 07/09/2016   Lab Results  Component Value Date   NA 131 (L) 07/09/2016   K 3.3 (L) 07/09/2016   CO2 30 07/09/2016   GLUCOSE 122 (H) 07/09/2016   BUN 14 07/09/2016   CREATININE 0.38 (L) 07/09/2016   BILITOT 1.0 06/29/2016   ALKPHOS 60 06/29/2016   AST 35 06/29/2016   ALT 21 06/29/2016   PROT 7.5 06/29/2016   ALBUMIN 5.2 (H) 06/29/2016   CALCIUM 8.6 (L) 07/09/2016   ANIONGAP 6 07/09/2016   GFR 78.75 06/11/2015   Lab Results  Component Value Date   CHOL 186 06/11/2015   Lab Results  Component Value Date   HDL 46.00 06/11/2015   Lab Results  Component Value Date   LDLCALC 117 (H) 06/11/2015   Lab Results  Component Value Date   TRIG 117.0 06/11/2015   Lab Results  Component Value Date   CHOLHDL 4 06/11/2015   No results found for: HGBA1C     Assessment & Plan:   Problem List Items Addressed This Visit    HTN (hypertension)    Well controlled, no changes to meds. Encouraged heart healthy diet such as the DASH diet and exercise as tolerated.       Relevant Medications   hydrochlorothiazide (HYDRODIURIL) 25 MG tablet   Parkinson's disease ([place])    Following with neurology. stable      Hyperlipidemia    Encouraged heart healthy diet, increase exercise, avoid trans fats, consider a krill oil cap daily      Relevant Medications  hydrochlorothiazide (HYDRODIURIL) 25 MG tablet   OA (osteoarthritis) of hip    Scheduled for surgery with ortho later this month for THR and is anxious to proceed due to level of pain. She is in good health and able to proceed with surgery.        Other Visit Diagnoses   None.     I have discontinued Ms. Beaudin's sertraline. I have also changed her hydrochlorothiazide. Additionally, I am having her maintain her carbidopa-levodopa, EPINEPHrine, ketoconazole, Polyethyl Glycol-Propyl Glycol, medroxyPROGESTERone,  estradiol, and diazepam.  Meds ordered this encounter  Medications  . hydrochlorothiazide (HYDRODIURIL) 25 MG tablet    Sig: Take 1.5 tablets (37.5 mg total) by mouth daily.    Dispense:  135 tablet    Refill:  0  . diazepam (VALIUM) 5 MG tablet    Sig: TAKE 1/2 TABLET BY MOUTH TWICE DAILY AS NEEDED FOR ANXIETY    Dispense:  30 tablet    Refill:  2     Penni Homans, MD

## 2016-07-12 ENCOUNTER — Ambulatory Visit: Payer: BLUE CROSS/BLUE SHIELD | Admitting: Family Medicine

## 2016-07-12 DIAGNOSIS — M5416 Radiculopathy, lumbar region: Secondary | ICD-10-CM | POA: Diagnosis not present

## 2016-07-12 DIAGNOSIS — M47812 Spondylosis without myelopathy or radiculopathy, cervical region: Secondary | ICD-10-CM | POA: Diagnosis not present

## 2016-07-12 DIAGNOSIS — G20 Parkinson's disease: Secondary | ICD-10-CM | POA: Diagnosis not present

## 2016-07-12 DIAGNOSIS — M170 Bilateral primary osteoarthritis of knee: Secondary | ICD-10-CM | POA: Diagnosis not present

## 2016-07-12 DIAGNOSIS — Z471 Aftercare following joint replacement surgery: Secondary | ICD-10-CM | POA: Diagnosis not present

## 2016-07-12 DIAGNOSIS — M4314 Spondylolisthesis, thoracic region: Secondary | ICD-10-CM | POA: Diagnosis not present

## 2016-07-13 ENCOUNTER — Ambulatory Visit: Payer: BLUE CROSS/BLUE SHIELD | Admitting: Family Medicine

## 2016-07-14 ENCOUNTER — Other Ambulatory Visit: Payer: Self-pay

## 2016-07-14 DIAGNOSIS — Z471 Aftercare following joint replacement surgery: Secondary | ICD-10-CM | POA: Diagnosis not present

## 2016-07-14 DIAGNOSIS — M170 Bilateral primary osteoarthritis of knee: Secondary | ICD-10-CM | POA: Diagnosis not present

## 2016-07-14 DIAGNOSIS — M47812 Spondylosis without myelopathy or radiculopathy, cervical region: Secondary | ICD-10-CM | POA: Diagnosis not present

## 2016-07-14 DIAGNOSIS — M4314 Spondylolisthesis, thoracic region: Secondary | ICD-10-CM | POA: Diagnosis not present

## 2016-07-14 DIAGNOSIS — M5416 Radiculopathy, lumbar region: Secondary | ICD-10-CM | POA: Diagnosis not present

## 2016-07-14 DIAGNOSIS — G20 Parkinson's disease: Secondary | ICD-10-CM | POA: Diagnosis not present

## 2016-07-16 DIAGNOSIS — M4314 Spondylolisthesis, thoracic region: Secondary | ICD-10-CM | POA: Diagnosis not present

## 2016-07-16 DIAGNOSIS — Z471 Aftercare following joint replacement surgery: Secondary | ICD-10-CM | POA: Diagnosis not present

## 2016-07-16 DIAGNOSIS — M47812 Spondylosis without myelopathy or radiculopathy, cervical region: Secondary | ICD-10-CM | POA: Diagnosis not present

## 2016-07-16 DIAGNOSIS — G20 Parkinson's disease: Secondary | ICD-10-CM | POA: Diagnosis not present

## 2016-07-16 DIAGNOSIS — C679 Malignant neoplasm of bladder, unspecified: Secondary | ICD-10-CM

## 2016-07-16 DIAGNOSIS — M170 Bilateral primary osteoarthritis of knee: Secondary | ICD-10-CM | POA: Diagnosis not present

## 2016-07-16 DIAGNOSIS — M5416 Radiculopathy, lumbar region: Secondary | ICD-10-CM | POA: Diagnosis not present

## 2016-07-16 HISTORY — DX: Malignant neoplasm of bladder, unspecified: C67.9

## 2016-07-19 ENCOUNTER — Emergency Department (HOSPITAL_COMMUNITY)
Admission: EM | Admit: 2016-07-19 | Discharge: 2016-07-19 | Disposition: A | Discharge status: Home / Self Care | Payer: Medicare Other | Attending: Emergency Medicine | Admitting: Emergency Medicine

## 2016-07-19 ENCOUNTER — Encounter (HOSPITAL_COMMUNITY): Payer: Self-pay | Admitting: Emergency Medicine

## 2016-07-19 DIAGNOSIS — Z87891 Personal history of nicotine dependence: Secondary | ICD-10-CM | POA: Insufficient documentation

## 2016-07-19 DIAGNOSIS — I10 Essential (primary) hypertension: Secondary | ICD-10-CM | POA: Diagnosis not present

## 2016-07-19 DIAGNOSIS — G20 Parkinson's disease: Secondary | ICD-10-CM | POA: Insufficient documentation

## 2016-07-19 DIAGNOSIS — Z79899 Other long term (current) drug therapy: Secondary | ICD-10-CM | POA: Insufficient documentation

## 2016-07-19 DIAGNOSIS — R319 Hematuria, unspecified: Secondary | ICD-10-CM | POA: Diagnosis present

## 2016-07-19 DIAGNOSIS — R310 Gross hematuria: Secondary | ICD-10-CM | POA: Diagnosis not present

## 2016-07-19 LAB — CBC WITH DIFFERENTIAL/PLATELET
BASOS PCT: 0 %
Basophils Absolute: 0 10*3/uL (ref 0.0–0.1)
Eosinophils Absolute: 0.2 10*3/uL (ref 0.0–0.7)
Eosinophils Relative: 2 %
HEMATOCRIT: 33.9 % — AB (ref 36.0–46.0)
HEMOGLOBIN: 11.5 g/dL — AB (ref 12.0–15.0)
LYMPHS PCT: 17 %
Lymphs Abs: 1.1 10*3/uL (ref 0.7–4.0)
MCH: 29 pg (ref 26.0–34.0)
MCHC: 33.9 g/dL (ref 30.0–36.0)
MCV: 85.6 fL (ref 78.0–100.0)
MONOS PCT: 8 %
Monocytes Absolute: 0.5 10*3/uL (ref 0.1–1.0)
NEUTROS ABS: 4.9 10*3/uL (ref 1.7–7.7)
NEUTROS PCT: 73 %
Platelets: 285 10*3/uL (ref 150–400)
RBC: 3.96 MIL/uL (ref 3.87–5.11)
RDW: 12.7 % (ref 11.5–15.5)
WBC: 6.7 10*3/uL (ref 4.0–10.5)

## 2016-07-19 LAB — BASIC METABOLIC PANEL
ANION GAP: 6 (ref 5–15)
BUN: 29 mg/dL — ABNORMAL HIGH (ref 6–20)
CALCIUM: 8.7 mg/dL — AB (ref 8.9–10.3)
CHLORIDE: 98 mmol/L — AB (ref 101–111)
CO2: 28 mmol/L (ref 22–32)
Creatinine, Ser: 0.66 mg/dL (ref 0.44–1.00)
GFR calc non Af Amer: >60 mL/min (ref >60)
GLUCOSE: 112 mg/dL — AB (ref 65–99)
POTASSIUM: 3.7 mmol/L (ref 3.5–5.1)
Sodium: 132 mmol/L — ABNORMAL LOW (ref 135–145)

## 2016-07-19 LAB — URINE MICROSCOPIC-ADD ON: Squamous Epithelial / LPF: NONE SEEN

## 2016-07-19 LAB — URINALYSIS, ROUTINE W REFLEX MICROSCOPIC
Bilirubin Urine: NEGATIVE
GLUCOSE, UA: NEGATIVE mg/dL
KETONES UR: NEGATIVE mg/dL
NITRITE: NEGATIVE
PH: 6.5 (ref 5.0–8.0)
Protein, ur: NEGATIVE mg/dL
SPECIFIC GRAVITY, URINE: 1.018 (ref 1.005–1.030)

## 2016-07-19 NOTE — ED Triage Notes (Signed)
Patient presents for hematuria x1 day. Reports left hip replacement on 8/23. Denies dysuria, frequency, abdominal or flank pain. Takes xarelto. A&O x4, ambulatory with walker, steady gait.

## 2016-07-19 NOTE — ED Notes (Signed)
MD at bedside.Delay in blood draw 

## 2016-07-19 NOTE — ED Provider Notes (Addendum)
[facility] DEPT Provider Note   CSN: [id number] Arrival date & time: 07/19/16  2059     History   Chief Complaint Chief Complaint  Patient presents with  . Hematuria    HPI Stephanie [name] is a 82 y.o. female.  HPI  Pt comes in with cc of hematuria. Pt had recent hip replacement, she is 10 days post op. She is on xarelto for DVT prophylaxis. She has no dysuria, no polyuria. Pt has no abd pain/suprapubic pain. She has no hx of cancer.   Past Medical History:  Diagnosis Date  . Back pain    due to osteoarthritis  . Benign mole    right hcets mole, bleeds when dried with towel  . Dry scalp   . H/O measles   . H/O mumps   . History of chicken pox   . History of kidney stones    yrs ago  . Hypertension   . Low back pain 01/18/2016  . OA (osteoarthritis)    [facility]  . Parkinson disease ([place]) 2012  . Preventative health care 01/18/2016  . Situational stress   . Vaginitis and vulvovaginitis 01/18/2016  . Vertigo    took antivert for a couple days    Patient Active Problem List   Diagnosis Date Noted  . OA (osteoarthritis) of hip 07/07/2016  . Vaginitis and vulvovaginitis 01/18/2016  . Preventative health care 01/18/2016  . Low back pain 01/18/2016  . History of chicken pox   . Hyperlipidemia 12/15/2014  . Allergic rhinitis 06/25/2014  . Allergy to bee sting 06/10/2014  . TMJ tenderness 11/16/2013  . Other malaise and fatigue 05/07/2013  . Trapezius muscle spasm 11/27/2012  . Parkinson's disease ([place]) 05/24/2012  . General medical examination 11/24/2011  . HTN (hypertension) 08/31/2011  . Depression 08/31/2011  . Arthritis 08/31/2011  . Tremor 08/31/2011  . Benign hypertensive heart disease without heart failure 05/12/2011    Past Surgical History:  Procedure Laterality Date  . BIOPSY THYROID     benign  . CATARACT EXTRACTION Bilateral   . CYSTECTOMY     wrist  . DILATION AND CURETTAGE OF UTERUS    . HAND SURGERY  Right    MCP joint for arthritis, prosthesis placed and removed  . LAPAROSCOPY     secondary infertility   . TONSILLECTOMY AND ADENOIDECTOMY    . TOTAL HIP ARTHROPLASTY Left 07/07/2016   Procedure: LEFT TOTAL HIP ARTHROPLASTY ANTERIOR APPROACH;  Surgeon: Gaynelle Arabian, MD;  Location: [facility];  Service: Orthopedics;  Laterality: Left;  . wrist cyst     right    OB History    Gravida Para Term Preterm AB Living   2 1       1    SAB TAB Ectopic Multiple Live Births                   Home Medications    Prior to Admission medications   Medication Sig Start Date End Date Taking? Authorizing Provider  acetaminophen (TYLENOL) 500 MG tablet Take 1,000 mg by mouth every 6 (six) hours as needed for moderate pain.   Yes Historical Provider, MD  carbidopa-levodopa (SINEMET IR) 25-100 MG per tablet Take 1 tablet by mouth 3 (three) times daily.  06/04/13  Yes Historical Provider, MD  EPINEPHrine 0.3 mg/0.3 mL IJ SOAJ injection Inject 0.3 mLs (0.3 mg total) into the muscle once. Patient taking differently: Inject 0.3 mg into the muscle once as  needed (allergic reaction).  10/01/14  Yes Midge Minium, MD  hydrochlorothiazide (HYDRODIURIL) 25 MG tablet Take 1.5 tablets (37.5 mg total) by mouth daily. 06/29/16  Yes Mosie Lukes, MD  rivaroxaban (XARELTO) 10 MG TABS tablet Take 1 tablet (10 mg total) by mouth daily with breakfast. 07/08/16  Yes Amber Constable, PA-C  diazepam (VALIUM) 5 MG tablet TAKE 1/2 TABLET BY MOUTH TWICE DAILY AS NEEDED FOR ANXIETY Patient not taking: Reported on 07/19/2016 06/29/16   Mosie Lukes, MD  estradiol (ESTRACE) 0.5 MG tablet TAKE 1/2 TABLET BY MOUTH EVERY DAY Patient not taking: Reported on 07/19/2016 02/03/16   Megan Salon, MD  HYDROmorphone (DILAUDID) 2 MG tablet Take 1-2 tablets (2-4 mg total) by mouth every 4 (four) hours as needed for severe pain. Patient not taking: Reported on 07/19/2016 07/07/16   Ardeen Jourdain, PA-C  medroxyPROGESTERone (PROVERA) 2.5 MG  tablet TAKE 1/2 TABLET(1.25 MG) BY MOUTH DAILY Patient not taking: Reported on 07/19/2016 02/03/16   Megan Salon, MD  methocarbamol (ROBAXIN) 500 MG tablet Take 1 tablet (500 mg total) by mouth every 6 (six) hours as needed for muscle spasms. Patient not taking: Reported on 07/19/2016 07/07/16   Ardeen Jourdain, PA-C  neomycin-polymyxin-hydrocortisone (CORTISPORIN) 3.5-10000-1 ophthalmic suspension Place 5 drops into both eyes 3 (three) times daily. Patient not taking: Reported on 07/19/2016 07/01/16   Mosie Lukes, MD    Family History Family History  Problem Relation Age of Onset  . Arthritis Mother   . Hypertension Mother   . Deep vein thrombosis Mother     recurrent, secondary to Bleeding disorder  . Arthritis Father   . Stroke Father   . Diabetes Sister   . Heart disease Sister   . Obesity Sister   . Arthritis Sister   . Hypertension Sister   . Heart disease Maternal Grandmother   . Heart disease Maternal Grandfather   . Peripheral vascular disease Paternal Grandmother     s/p leg amputation  . Stroke Paternal Grandfather     Social History Social History  Substance Use Topics  . Smoking status: Former Smoker    Quit date: 05/04/1966  . Smokeless tobacco: Never Used  . Alcohol use 2.0 oz/week    4 Standard drinks or equivalent per week     Comment: social     Allergies   Bee venom; Codeine; Penicillins; Lodine [etodolac]; and Tramadol   Review of Systems Review of Systems  ROS 10 Systems reviewed and are negative for acute change except as noted in the HPI.     Physical Exam Updated Vital Signs BP 152/75   Pulse 80   Temp 98.6 F (37 C) (Oral)   Resp 20   LMP 01/13/2013 Comment: spotting-had benign endometrial biopsy   SpO2 100%   Physical Exam  Constitutional: She is oriented to person, place, and time. She appears well-developed.  HENT:  Head: Normocephalic and atraumatic.  Eyes: EOM are normal.  Neck: Normal range of motion. Neck supple.    Cardiovascular: Normal rate.   Pulmonary/Chest: Effort normal.  Abdominal: Bowel sounds are normal.  Neurological: She is alert and oriented to person, place, and time.  Skin: Skin is warm and dry.  Nursing note and vitals reviewed.    ED Treatments / Results  Labs (all labs ordered are listed, but only abnormal results are displayed) Labs Reviewed  URINALYSIS, ROUTINE W REFLEX MICROSCOPIC (NOT AT [facility]) - Abnormal; Notable for the following:  Result Value   Color, Urine RED (*)    APPearance CLOUDY (*)    Hgb urine dipstick LARGE (*)    Leukocytes, UA SMALL (*)    All other components within normal limits  BASIC METABOLIC PANEL - Abnormal; Notable for the following:    Sodium 132 (*)    Chloride 98 (*)    Glucose, Bld 112 (*)    BUN 29 (*)    Calcium 8.7 (*)    All other components within normal limits  CBC WITH DIFFERENTIAL/PLATELET - Abnormal; Notable for the following:    Hemoglobin 11.5 (*)    HCT 33.9 (*)    All other components within normal limits  URINE MICROSCOPIC-ADD ON - Abnormal; Notable for the following:    Bacteria, UA RARE (*)    All other components within normal limits  URINE CULTURE    EKG  EKG Interpretation None       Radiology No results found.  Procedures Procedures (including critical care time)  Medications Ordered in ED Medications - No data to display   Initial Impression / Assessment and Plan / ED Course  I have reviewed the triage vital signs and the nursing notes.  Pertinent labs & imaging results that were available during my care of the patient were reviewed by me and considered in my medical decision making (see chart for details).  Clinical Course  Comment By Time  UA is clean. No clear evidence of infection, no dysuria, polyuria - we will send urine for cultures. Strict ER return precautions have been discussed, and patient is agreeing with the plan and is comfortable with the workup done and the recommendations  from the ER.  Varney Biles, MD 09/04 2339   Pt comes in with gross hematuria. She is on xarelto. She isn't passing clots. She has no uti like symptoms. UA and cultures sent.  Will check BMP, CBC and UA. Advised continuing the xarelto unless bleeding gets severe, as it appears that at the moment it is not a severe bleed.  Final Clinical Impressions(s) / ED Diagnoses   Final diagnoses:  Gross hematuria    New Prescriptions New Prescriptions   No medications on file     Varney Biles, MD 07/19/16 2234    Varney Biles, MD 07/19/16 2341

## 2016-07-19 NOTE — ED Notes (Signed)
MD at bedside. 

## 2016-07-19 NOTE — Discharge Instructions (Signed)
Please call Dr. Maureen Ralphs if you desire to stop the xarelto. Please see your doctor in 1 week - even if the Urine is clear. Return to the ER if the bleeding gets heavier or you are unable to urinate.

## 2016-07-20 DIAGNOSIS — M170 Bilateral primary osteoarthritis of knee: Secondary | ICD-10-CM | POA: Diagnosis not present

## 2016-07-20 DIAGNOSIS — Z471 Aftercare following joint replacement surgery: Secondary | ICD-10-CM | POA: Diagnosis not present

## 2016-07-20 DIAGNOSIS — M47812 Spondylosis without myelopathy or radiculopathy, cervical region: Secondary | ICD-10-CM | POA: Diagnosis not present

## 2016-07-20 DIAGNOSIS — M5416 Radiculopathy, lumbar region: Secondary | ICD-10-CM | POA: Diagnosis not present

## 2016-07-20 DIAGNOSIS — G20 Parkinson's disease: Secondary | ICD-10-CM | POA: Diagnosis not present

## 2016-07-20 DIAGNOSIS — M4314 Spondylolisthesis, thoracic region: Secondary | ICD-10-CM | POA: Diagnosis not present

## 2016-07-22 DIAGNOSIS — Z96642 Presence of left artificial hip joint: Secondary | ICD-10-CM | POA: Diagnosis not present

## 2016-07-22 DIAGNOSIS — Z471 Aftercare following joint replacement surgery: Secondary | ICD-10-CM | POA: Diagnosis not present

## 2016-07-22 DIAGNOSIS — R3121 Asymptomatic microscopic hematuria: Secondary | ICD-10-CM | POA: Diagnosis not present

## 2016-07-22 LAB — URINE CULTURE

## 2016-07-23 ENCOUNTER — Telehealth (HOSPITAL_BASED_OUTPATIENT_CLINIC_OR_DEPARTMENT_OTHER): Payer: Self-pay

## 2016-07-23 NOTE — Telephone Encounter (Signed)
Post ED Visit - Positive Culture Follow-up  Culture report reviewed by antimicrobial stewardship pharmacist:  []  Elenor Quinones, Pharm.D. []  Heide Guile, Pharm.D., BCPS []  Parks Neptune, Pharm.D. []  Alycia Rossetti, Pharm.D., BCPS []  [place], Pharm.D., BCPS, AAHIVP []  Legrand Como, Pharm.D., BCPS, AAHIVP []  Milus Glazier, Pharm.D. []  Rob Evette Doffing, Pharm.[place] Positive urine culture and no further patient follow-up is required at this time.  Genia Del 07/23/2016, 9:41 AM

## 2016-07-26 ENCOUNTER — Ambulatory Visit: Payer: BLUE CROSS/BLUE SHIELD | Admitting: Medical

## 2016-07-29 NOTE — Discharge Summary (Signed)
Physician Discharge Summary   Patient ID: Stephanie [name] MRN: [id number] DOB/AGE: 1932-07-08 82 y.o.  Admit date: 07/07/2016 Discharge date: 07/09/2016  Primary Diagnosis:  Osteoarthritis of the Left hip.   Admission Diagnoses:  Past Medical History:  Diagnosis Date  . Back pain    due to osteoarthritis  . Benign mole    right hcets mole, bleeds when dried with towel  . Dry scalp   . H/O measles   . H/O mumps   . History of chicken pox   . History of kidney stones    yrs ago  . Hypertension   . Low back pain 01/18/2016  . OA (osteoarthritis)    [facility] for [facility] arthritis  . Parkinson disease ([place]) 2012  . Preventative health care 01/18/2016  . Situational stress   . Vaginitis and vulvovaginitis 01/18/2016  . Vertigo    took antivert for a couple days   Discharge Diagnoses:   Principal Problem:   OA (osteoarthritis) of hip  Estimated body mass index is 19.77 kg/m as calculated from the following:   Height as of this encounter: 5' 4.5" (1.638 m).   Weight as of this encounter: 53.1 kg (117 lb).  Procedure(s) (LRB): LEFT TOTAL HIP ARTHROPLASTY ANTERIOR APPROACH (Left)   Consults: None  HPI: Stephanie [name] is a 82 y.o. female who has advanced end-  stage arthritis of their Left  hip with progressively worsening pain and  dysfunction.The patient has failed nonoperative management and presents for  total hip arthroplasty.   Laboratory Data: Admission on 07/07/2016, Discharged on 07/09/2016  Component Date Value Ref Range Status  . WBC 07/08/2016 11.0* 4.0 - 10.5 K/uL Final  . RBC 07/08/2016 4.01  3.87 - 5.11 MIL/uL Final  . Hemoglobin 07/08/2016 12.1  12.0 - 15.0 g/dL Final  . HCT 07/08/2016 35.2* 36.0 - 46.0 % Final  . MCV 07/08/2016 87.8  78.0 - 100.0 fL Final  . MCH 07/08/2016 30.2  26.0 - 34.0 pg Final  . MCHC 07/08/2016 34.4  30.0 - 36.0 g/dL Final  . RDW 07/08/2016 13.0  11.5 - 15.5 % Final  . Platelets 07/08/2016 196  150 - 400 K/uL Final    . Sodium 07/08/2016 133* 135 - 145 mmol/L Final  . Potassium 07/08/2016 3.6  3.5 - 5.1 mmol/L Final  . Chloride 07/08/2016 100* 101 - 111 mmol/L Final  . CO2 07/08/2016 28  22 - 32 mmol/L Final  . Glucose, Bld 07/08/2016 167* 65 - 99 mg/dL Final  . BUN 07/08/2016 14  6 - 20 mg/dL Final  . Creatinine, Ser 07/08/2016 0.45  0.44 - 1.00 mg/dL Final  . Calcium 07/08/2016 8.4* 8.9 - 10.3 mg/dL Final  . GFR calc non Af Amer 07/08/2016 >60  >60 mL/min Final  . GFR calc Af Amer 07/08/2016 >60  >60 mL/min Final   Comment: (NOTE) The eGFR has been calculated using the CKD EPI equation. This calculation has not been validated in all clinical situations. eGFR's persistently <60 mL/min signify possible Chronic Kidney Disease.   . Anion gap 07/08/2016 5  5 - 15 Final  . WBC 07/09/2016 11.9* 4.0 - 10.5 K/uL Final  . RBC 07/09/2016 3.92  3.87 - 5.11 MIL/uL Final  . Hemoglobin 07/09/2016 11.8* 12.0 - 15.0 g/dL Final  . HCT 07/09/2016 34.1* 36.0 - 46.0 % Final  . MCV 07/09/2016 87.0  78.0 - 100.0 fL Final  . MCH 07/09/2016 30.1  26.0 - 34.0 pg Final  .  MCHC 07/09/2016 34.6  30.0 - 36.0 g/dL Final  . RDW 07/09/2016 12.9  11.5 - 15.5 % Final  . Platelets 07/09/2016 185  150 - 400 K/uL Final  . Sodium 07/09/2016 131* 135 - 145 mmol/L Final  . Potassium 07/09/2016 3.3* 3.5 - 5.1 mmol/L Final  . Chloride 07/09/2016 95* 101 - 111 mmol/L Final  . CO2 07/09/2016 30  22 - 32 mmol/L Final  . Glucose, Bld 07/09/2016 122* 65 - 99 mg/dL Final  . BUN 07/09/2016 14  6 - 20 mg/dL Final  . Creatinine, Ser 07/09/2016 0.38* 0.44 - 1.00 mg/dL Final  . Calcium 07/09/2016 8.6* 8.9 - 10.3 mg/dL Final  . GFR calc non Af Amer 07/09/2016 >60  >60 mL/min Final  . GFR calc Af Amer 07/09/2016 >60  >60 mL/min Final   Comment: (NOTE) The eGFR has been calculated using the CKD EPI equation. This calculation has not been validated in all clinical situations. eGFR's persistently <60 mL/min signify possible Chronic  Kidney Disease.   Georgiann Hahn gap 07/09/2016 6  5 - 15 Final  Hospital Outpatient Visit on 06/29/2016  Component Date Value Ref Range Status  . MRSA, PCR 06/29/2016 NEGATIVE  NEGATIVE Final  . Staphylococcus aureus 06/29/2016 NEGATIVE  NEGATIVE Final   Comment:        The Xpert SA Assay (FDA approved for NASAL specimens in patients over 42 years of age), is one component of a comprehensive surveillance program.  Test performance has been validated by [facility] for patients greater than or equal to 60 year old. It is not intended to diagnose infection nor to guide or monitor treatment.   Marland Kitchen aPTT 06/29/2016 27  24 - 36 seconds Final  . WBC 06/29/2016 7.4  4.0 - 10.5 K/uL Final  . RBC 06/29/2016 4.83  3.87 - 5.11 MIL/uL Final  . Hemoglobin 06/29/2016 14.6  12.0 - 15.0 g/dL Final  . HCT 06/29/2016 42.9  36.0 - 46.0 % Final  . MCV 06/29/2016 88.8  78.0 - 100.0 fL Final  . MCH 06/29/2016 30.2  26.0 - 34.0 pg Final  . MCHC 06/29/2016 34.0  30.0 - 36.0 g/dL Final  . RDW 06/29/2016 13.4  11.5 - 15.5 % Final  . Platelets 06/29/2016 237  150 - 400 K/uL Final  . Sodium 06/29/2016 134* 135 - 145 mmol/L Final  . Potassium 06/29/2016 4.0  3.5 - 5.1 mmol/L Final  . Chloride 06/29/2016 96* 101 - 111 mmol/L Final  . CO2 06/29/2016 30  22 - 32 mmol/L Final  . Glucose, Bld 06/29/2016 100* 65 - 99 mg/dL Final  . BUN 06/29/2016 26* 6 - 20 mg/dL Final  . Creatinine, Ser 06/29/2016 0.61  0.44 - 1.00 mg/dL Final  . Calcium 06/29/2016 9.9  8.9 - 10.3 mg/dL Final  . Total Protein 06/29/2016 7.5  6.5 - 8.1 g/dL Final  . Albumin 06/29/2016 5.2* 3.5 - 5.0 g/dL Final  . AST 06/29/2016 35  15 - 41 U/L Final  . ALT 06/29/2016 21  14 - 54 U/L Final  . Alkaline Phosphatase 06/29/2016 60  38 - 126 U/L Final  . Total Bilirubin 06/29/2016 1.0  0.3 - 1.2 mg/dL Final  . GFR calc non Af Amer 06/29/2016 >60  >60 mL/min Final  . GFR calc Af Amer 06/29/2016 >60  >60 mL/min Final   Comment: (NOTE) The eGFR has been  calculated using the CKD EPI equation. This calculation has not been validated in all clinical situations. eGFR's persistently <  60 mL/min signify possible Chronic Kidney Disease.   . Anion gap 06/29/2016 8  5 - 15 Final  . Prothrombin Time 06/29/2016 12.8  11.4 - 15.2 seconds Final  . INR 06/29/2016 0.96   Final  . ABO/RH(D) 07/07/2016 A NEG   Final  . Antibody Screen 07/07/2016 NEG   Final  . Sample Expiration 07/07/2016 07/10/2016   Final  . Extend sample reason 07/07/2016 NO TRANSFUSIONS OR PREGNANCY IN THE PAST 3 MONTHS   Final  . Color, Urine 06/29/2016 YELLOW  YELLOW Final  . APPearance 06/29/2016 CLEAR  CLEAR Final  . Specific Gravity, Urine 06/29/2016 1.019  1.005 - 1.030 Final  . pH 06/29/2016 7.5  5.0 - 8.0 Final  . Glucose, UA 06/29/2016 NEGATIVE  NEGATIVE mg/dL Final  . Hgb urine dipstick 06/29/2016 NEGATIVE  NEGATIVE Final  . Bilirubin Urine 06/29/2016 NEGATIVE  NEGATIVE Final  . Ketones, ur 06/29/2016 NEGATIVE  NEGATIVE mg/dL Final  . Protein, ur 56/64/8303 NEGATIVE  NEGATIVE mg/dL Final  . Nitrite 22/11/9922 NEGATIVE  NEGATIVE Final  . Leukocytes, UA 06/29/2016 SMALL* NEGATIVE Final  . ABO/RH(D) 06/29/2016 A NEG   Final  . Squamous Epithelial / LPF 06/29/2016 0-5* NONE SEEN Final  . WBC, UA 06/29/2016 0-5  0 - 5 WBC/hpf Final  . RBC / HPF 06/29/2016 6-30  0 - 5 RBC/hpf Final  . Bacteria, UA 06/29/2016 RARE* NONE SEEN Final     X-Rays:Dg Pelvis Portable  Result Date: 07/07/2016 CLINICAL DATA:  Status post left hip replacement EXAM: PORTABLE PELVIS 1-2 VIEWS COMPARISON:  None. FINDINGS: Left hip prosthesis is noted. Surgical drain is seen in place. No acute bony or soft tissue abnormality is noted. IMPRESSION: Status post left hip replacement. Electronically Signed   By: Alcide Clever M.D.   On: 07/07/2016 16:11   Dg C-arm 1-60 Min-no Report  Result Date: 07/26/2016 Fluoroscopy was utilized by the requesting physician.  No radiographic interpretation.     EKG: Orders placed or performed during the hospital encounter of 06/29/16  . EKG 12 lead  . EKG 12 lead     Hospital Course: Patient was admitted to [facility] and taken to the OR and underwent the above state procedure without complications.  Patient tolerated the procedure well and was later transferred to the recovery room and then to the orthopaedic floor for postoperative care.  They were given PO and IV analgesics for pain control following their surgery.  They were given 24 hours of postoperative antibiotics of  Anti-infectives    Start     Dose/Rate Route Frequency Ordered Stop   07/08/16 0200  vancomycin (VANCOCIN) IVPB 1000 mg/200 mL premix     1,000 mg 200 mL/hr over 60 Minutes Intravenous Every 12 hours 07/07/16 1728 07/08/16 0220   07/07/16 1123  vancomycin (VANCOCIN) IVPB 1000 mg/200 mL premix     1,000 mg 200 mL/hr over 60 Minutes Intravenous 60 min pre-op 07/07/16 1123 07/07/16 1414     and started on DVT prophylaxis in the form of Xarelto.   PT and OT were ordered for total hip protocol.  The patient was allowed to be WBAT with therapy. Discharge planning was consulted to help with postop disposition and equipment needs.  Patient had a decent  night on the evening of surgery.  They started to get up OOB with therapy on day one.  Hemovac drain was pulled without difficulty.  Continued to work with therapy into day two.  Dressing was changed on day  two and the incision was healing well.  Patient was seen in rounds and was ready to go home.  Diet: Cardiac diet Activity:WBAT Follow-up:in 2 weeks Disposition - Home Discharged Condition: good   Discharge Instructions    Call MD / Call 911    Complete by:  As directed    If you experience chest pain or shortness of breath, CALL 911 and be transported to the hospital emergency room.  If you develope a fever above 101 F, pus (white drainage) or increased drainage or redness at the wound, or calf pain, call your  surgeon's office.   Constipation Prevention    Complete by:  As directed    Drink plenty of fluids.  Prune juice may be helpful.  You may use a stool softener, such as Colace (over the counter) 100 mg twice a day.  Use MiraLax (over the counter) for constipation as needed.   Diet - low sodium heart healthy    Complete by:  As directed    Discharge instructions    Complete by:  As directed    INSTRUCTIONS AFTER JOINT REPLACEMENT   Remove items at home which could result in a fall. This includes throw rugs or furniture in walking pathways ICE to the affected joint every three hours while awake for 30 minutes at a time, for at least the first 3-5 days, and then as needed for pain and swelling.  Continue to use ice for pain and swelling. You may notice swelling that will progress down to the foot and ankle.  This is normal after surgery.  Elevate your leg when you are not up walking on it.   Continue to use the breathing machine you got in the hospital (incentive spirometer) which will help keep your temperature down.  It is common for your temperature to cycle up and down following surgery, especially at night when you are not up moving around and exerting yourself.  The breathing machine keeps your lungs expanded and your temperature down.   DIET:  As you were doing prior to hospitalization, we recommend a well-balanced diet.  DRESSING / WOUND CARE / SHOWERING  You may change your dressing every day with sterile gauze.  Please use good hand washing techniques before changing the dressing.  Do not use any lotions or creams on the incision until instructed by your surgeon.  ACTIVITY  Increase activity slowly as tolerated, but follow the weight bearing instructions below.   No driving for 6 weeks or until further direction given by your physician.  You cannot drive while taking narcotics.  No lifting or carrying greater than 10 lbs. until further directed by your surgeon. Avoid periods of  inactivity such as sitting longer than an hour when not asleep. This helps prevent blood clots.  You may return to work once you are authorized by your doctor.     WEIGHT BEARING   Weight bearing as tolerated with assist device (walker, cane, etc) as directed, use it as long as suggested by your surgeon or therapist, typically at least 4-6 weeks.   EXERCISES  Results after joint replacement surgery are often greatly improved when you follow the exercise, range of motion and muscle strengthening exercises prescribed by your doctor. Safety measures are also important to protect the joint from further injury. Any time any of these exercises cause you to have increased pain or swelling, decrease what you are doing until you are comfortable again and then slowly increase them. If you have  problems or questions, call your caregiver or physical therapist for advice.   Rehabilitation is important following a joint replacement. After just a few days of immobilization, the muscles of the leg can become weakened and shrink (atrophy).  These exercises are designed to build up the tone and strength of the thigh and leg muscles and to improve motion. Often times heat used for twenty to thirty minutes before working out will loosen up your tissues and help with improving the range of motion but do not use heat for the first two weeks following surgery (sometimes heat can increase post-operative swelling).   These exercises can be done on a training (exercise) mat, on the floor, on a table or on a bed. Use whatever works the best and is most comfortable for you.    Use music or television while you are exercising so that the exercises are a pleasant break in your day. This will make your life better with the exercises acting as a break in your routine that you can look forward to.   Perform all exercises about fifteen times, three times per day or as directed.  You should exercise both the operative leg and the  other leg as well.  Exercises include:   Quad Sets - Tighten up the muscle on the front of the thigh (Quad) and hold for 5-10 seconds.   Straight Leg Raises - With your knee straight (if you were given a brace, keep it on), lift the leg to 60 degrees, hold for 3 seconds, and slowly lower the leg.  Perform this exercise against resistance later as your leg gets stronger.  Leg Slides: Lying on your back, slowly slide your foot toward your buttocks, bending your knee up off the floor (only go as far as is comfortable). Then slowly slide your foot back down until your leg is flat on the floor again.  Angel Wings: Lying on your back spread your legs to the side as far apart as you can without causing discomfort.  Hamstring Strength:  Lying on your back, push your heel against the floor with your leg straight by tightening up the muscles of your buttocks.  Repeat, but this time bend your knee to a comfortable angle, and push your heel against the floor.  You may put a pillow under the heel to make it more comfortable if necessary.   A rehabilitation program following joint replacement surgery can speed recovery and prevent re-injury in the future due to weakened muscles. Contact your doctor or a physical therapist for more information on knee rehabilitation.    CONSTIPATION  Constipation is defined medically as fewer than three stools per week and severe constipation as less than one stool per week.  Even if you have a regular bowel pattern at home, your normal regimen is likely to be disrupted due to multiple reasons following surgery.  Combination of anesthesia, postoperative narcotics, change in appetite and fluid intake all can affect your bowels.   YOU MUST use at least one of the following options; they are listed in order of increasing strength to get the job done.  They are all available over the counter, and you may need to use some, POSSIBLY even all of these options:    Drink plenty of fluids  (prune juice may be helpful) and high fiber foods Colace 100 mg by mouth twice a day  Senokot for constipation as directed and as needed Dulcolax (bisacodyl), take with full glass of water  Miralax (  polyethylene glycol) once or twice a day as needed.  If you have tried all these things and are unable to have a bowel movement in the first 3-4 days after surgery call either your surgeon or your primary doctor.    If you experience loose stools or diarrhea, hold the medications until you stool forms back up.  If your symptoms do not get better within 1 week or if they get worse, check with your doctor.  If you experience "the worst abdominal pain ever" or develop nausea or vomiting, please contact the office immediately for further recommendations for treatment.   ITCHING:  If you experience itching with your medications, try taking only a single pain pill, or even half a pain pill at a time.  You can also use Benadryl over the counter for itching or also to help with sleep.   TED HOSE STOCKINGS:  Use stockings on both legs until for at least 2 weeks or as directed by physician office. They may be removed at night for sleeping.  MEDICATIONS:  See your medication summary on the "After Visit Summary" that nursing will review with you.  You may have some home medications which will be placed on hold until you complete the course of blood thinner medication.  It is important for you to complete the blood thinner medication as prescribed.  PRECAUTIONS:  If you experience chest pain or shortness of breath - call 911 immediately for transfer to the hospital emergency department.   If you develop a fever greater that 101 F, purulent drainage from wound, increased redness or drainage from wound, foul odor from the wound/dressing, or calf pain - CONTACT YOUR SURGEON.                                                   FOLLOW-UP APPOINTMENTS:  If you do not already have a post-op appointment, please call the  office for an appointment to be seen by your surgeon.  Guidelines for how soon to be seen are listed in your "After Visit Summary", but are typically between 1-4 weeks after surgery.   MAKE SURE YOU:  Understand these instructions.  Get help right away if you are not doing well or get worse.    Thank you for letting us be a part of your medical care team.  It is a privilege we respect greatly.  We hope these instructions will help you stay on track for a fast and full recovery!   Increase activity slowly as tolerated    Complete by:  As directed        Medication List    STOP taking these medications   BIOTIN 5000 5 MG Caps Generic drug:  Biotin   CALCIUM + D + K PO   COQ10 PO   diclofenac 50 MG EC tablet Commonly known as:  VOLTAREN   HYDROcodone-acetaminophen 5-325 MG tablet Commonly known as:  NORCO/VICODIN   multivitamin tablet   PROBIOTIC DAILY PO   Vitamin D3 2000 units Tabs     TAKE these medications   carbidopa-levodopa 25-100 MG tablet Commonly known as:  SINEMET IR Take 1 tablet by mouth 3 (three) times daily.   diazepam 5 MG tablet Commonly known as:  VALIUM TAKE 1/2 TABLET BY MOUTH TWICE DAILY AS NEEDED FOR ANXIETY   EPINEPHrine 0.3 mg/0.3 mL  Soaj injection Commonly known as:  EPI-PEN Inject 0.3 mLs (0.3 mg total) into the muscle once. What changed:  when to take this  reasons to take this   estradiol 0.5 MG tablet Commonly known as:  ESTRACE TAKE 1/2 TABLET BY MOUTH EVERY DAY   hydrochlorothiazide 25 MG tablet Commonly known as:  HYDRODIURIL Take 1.5 tablets (37.5 mg total) by mouth daily.   HYDROmorphone 2 MG tablet Commonly known as:  DILAUDID Take 1-2 tablets (2-4 mg total) by mouth every 4 (four) hours as needed for severe pain.   medroxyPROGESTERone 2.5 MG tablet Commonly known as:  PROVERA TAKE 1/2 TABLET(1.25 MG) BY MOUTH DAILY   methocarbamol 500 MG tablet Commonly known as:  ROBAXIN Take 1 tablet (500 mg total) by mouth  every 6 (six) hours as needed for muscle spasms.   neomycin-polymyxin-hydrocortisone 3.5-10000-1 ophthalmic suspension Commonly known as:  CORTISPORIN Place 5 drops into both eyes 3 (three) times daily.   rivaroxaban 10 MG Tabs tablet Commonly known as:  XARELTO Take 1 tablet (10 mg total) by mouth daily with breakfast.      Follow-up Information    Gearlean Alf, MD. Schedule an appointment as soon as possible for a visit on 07/22/2016.   Specialty:  Orthopedic Surgery Contact information: [address] [place] [address] [phone]        [facility] .   Why:  now known as [facility]; will provide your home health physical therapy Contact information: [place] [address] [phone]           Signed: Arlee Muslim, PA-C Orthopaedic Surgery 07/29/2016, 8:53 AM

## 2016-07-30 ENCOUNTER — Other Ambulatory Visit: Payer: Self-pay | Admitting: Family Medicine

## 2016-07-31 ENCOUNTER — Other Ambulatory Visit: Payer: Self-pay | Admitting: Family Medicine

## 2016-08-02 NOTE — Telephone Encounter (Signed)
Faxed hardcopy for valium to [facility] in [place]

## 2016-08-03 DIAGNOSIS — R351 Nocturia: Secondary | ICD-10-CM | POA: Diagnosis not present

## 2016-08-03 DIAGNOSIS — R310 Gross hematuria: Secondary | ICD-10-CM | POA: Diagnosis not present

## 2016-08-03 DIAGNOSIS — R350 Frequency of micturition: Secondary | ICD-10-CM | POA: Diagnosis not present

## 2016-08-13 DIAGNOSIS — Z96642 Presence of left artificial hip joint: Secondary | ICD-10-CM | POA: Diagnosis not present

## 2016-08-13 DIAGNOSIS — Z471 Aftercare following joint replacement surgery: Secondary | ICD-10-CM | POA: Diagnosis not present

## 2016-08-16 ENCOUNTER — Other Ambulatory Visit: Payer: Self-pay | Admitting: Family Medicine

## 2016-08-17 DIAGNOSIS — R319 Hematuria, unspecified: Secondary | ICD-10-CM | POA: Diagnosis not present

## 2016-08-17 DIAGNOSIS — R310 Gross hematuria: Secondary | ICD-10-CM | POA: Diagnosis not present

## 2016-08-24 DIAGNOSIS — Z23 Encounter for immunization: Secondary | ICD-10-CM | POA: Diagnosis not present

## 2016-09-06 ENCOUNTER — Other Ambulatory Visit: Payer: Self-pay | Admitting: Family Medicine

## 2016-09-09 DIAGNOSIS — C672 Malignant neoplasm of lateral wall of bladder: Secondary | ICD-10-CM | POA: Diagnosis not present

## 2016-09-09 DIAGNOSIS — C641 Malignant neoplasm of right kidney, except renal pelvis: Secondary | ICD-10-CM | POA: Diagnosis not present

## 2016-09-09 DIAGNOSIS — R310 Gross hematuria: Secondary | ICD-10-CM | POA: Diagnosis not present

## 2016-09-10 ENCOUNTER — Other Ambulatory Visit: Payer: Self-pay | Admitting: Urology

## 2016-09-14 DIAGNOSIS — M542 Cervicalgia: Secondary | ICD-10-CM | POA: Diagnosis not present

## 2016-09-14 DIAGNOSIS — M9901 Segmental and somatic dysfunction of cervical region: Secondary | ICD-10-CM | POA: Diagnosis not present

## 2016-09-14 DIAGNOSIS — M9903 Segmental and somatic dysfunction of lumbar region: Secondary | ICD-10-CM | POA: Diagnosis not present

## 2016-09-14 DIAGNOSIS — M545 Low back pain: Secondary | ICD-10-CM | POA: Diagnosis not present

## 2016-09-16 DIAGNOSIS — M542 Cervicalgia: Secondary | ICD-10-CM | POA: Diagnosis not present

## 2016-09-16 DIAGNOSIS — M545 Low back pain: Secondary | ICD-10-CM | POA: Diagnosis not present

## 2016-09-16 DIAGNOSIS — M9901 Segmental and somatic dysfunction of cervical region: Secondary | ICD-10-CM | POA: Diagnosis not present

## 2016-09-16 DIAGNOSIS — M9903 Segmental and somatic dysfunction of lumbar region: Secondary | ICD-10-CM | POA: Diagnosis not present

## 2016-09-17 ENCOUNTER — Encounter (HOSPITAL_BASED_OUTPATIENT_CLINIC_OR_DEPARTMENT_OTHER): Payer: Self-pay | Admitting: *Deleted

## 2016-09-17 DIAGNOSIS — Z96642 Presence of left artificial hip joint: Secondary | ICD-10-CM | POA: Diagnosis not present

## 2016-09-17 DIAGNOSIS — Z471 Aftercare following joint replacement surgery: Secondary | ICD-10-CM | POA: Diagnosis not present

## 2016-09-17 NOTE — Progress Notes (Signed)
NPO AFTER MN.  ARRIVE AT 0800.  NEEDS ISTAT 8.  CURRENT EKG IN CHART AND EPIC.  WILL TAKE AM MEDS W/ EXCEPTION NO HCTZ DOS W/ SIPS OF WATER.  FYI;  PT HUSBAND PASSED AWAY VERY RECENT 09-25-16.

## 2016-09-20 ENCOUNTER — Telehealth: Payer: Self-pay | Admitting: Family Medicine

## 2016-09-20 DIAGNOSIS — M9903 Segmental and somatic dysfunction of lumbar region: Secondary | ICD-10-CM | POA: Diagnosis not present

## 2016-09-20 DIAGNOSIS — M9901 Segmental and somatic dysfunction of cervical region: Secondary | ICD-10-CM | POA: Diagnosis not present

## 2016-09-20 DIAGNOSIS — M545 Low back pain: Secondary | ICD-10-CM | POA: Diagnosis not present

## 2016-09-20 DIAGNOSIS — M542 Cervicalgia: Secondary | ICD-10-CM | POA: Diagnosis not present

## 2016-09-20 NOTE — Telephone Encounter (Signed)
Caller name: Stephanie [name] Relationship to patient: self Can be reached: [phone] Pharmacy: [facility], [address]  Reason for call: Pt has been taking diazepam. Pt had surgery, then husband passed, now having another surgery Wed 11/8. Pt has 5 left and was trying to get filled before surgery but she was told by pharmacy it could not be filled until 11/18. Pt is taking 1 pill at night and another 1/2 if she wakes up in the night. Not taking any during the day time. Pt would like to know if we can change RX or call to ok refill early.

## 2016-09-21 DIAGNOSIS — M9903 Segmental and somatic dysfunction of lumbar region: Secondary | ICD-10-CM | POA: Diagnosis not present

## 2016-09-21 DIAGNOSIS — M542 Cervicalgia: Secondary | ICD-10-CM | POA: Diagnosis not present

## 2016-09-21 DIAGNOSIS — M9901 Segmental and somatic dysfunction of cervical region: Secondary | ICD-10-CM | POA: Diagnosis not present

## 2016-09-21 DIAGNOSIS — M545 Low back pain: Secondary | ICD-10-CM | POA: Diagnosis not present

## 2016-09-21 MED ORDER — DIAZEPAM 5 MG PO TABS: 5.0000 mg | ORAL_TABLET | Freq: Two times a day (BID) | ORAL | 0 refills | Status: DC | PRN

## 2016-09-21 MED ORDER — DIAZEPAM 5 MG PO TABS: 5.0000 mg | ORAL_TABLET | Freq: Two times a day (BID) | ORAL | 1 refills | Status: DC | PRN

## 2016-09-21 NOTE — Telephone Encounter (Signed)
Please give her a new diazepam prescription with same strength but change sig to 1 full tab bid prn anxiety, insomnia and disp #60 with 1 and let pharmacy know and then they should fill

## 2016-09-21 NOTE — Telephone Encounter (Signed)
Updated medication and phoned into the pharmacy as PCP instructed.  Patient aware of change and prescription at pharmacy.

## 2016-09-22 ENCOUNTER — Encounter (HOSPITAL_BASED_OUTPATIENT_CLINIC_OR_DEPARTMENT_OTHER): Payer: Self-pay | Admitting: *Deleted

## 2016-09-22 ENCOUNTER — Encounter (HOSPITAL_BASED_OUTPATIENT_CLINIC_OR_DEPARTMENT_OTHER)
Admission: RE | Disposition: A | Discharge status: Home / Self Care | Payer: Self-pay | Source: Ambulatory Visit | Attending: Urology

## 2016-09-22 ENCOUNTER — Ambulatory Visit (HOSPITAL_BASED_OUTPATIENT_CLINIC_OR_DEPARTMENT_OTHER): Payer: Medicare Other | Admitting: Anesthesiology

## 2016-09-22 ENCOUNTER — Ambulatory Visit (HOSPITAL_BASED_OUTPATIENT_CLINIC_OR_DEPARTMENT_OTHER)
Admission: RE | Admit: 2016-09-22 | Discharge: 2016-09-22 | Disposition: A | Discharge status: Home / Self Care | Payer: Medicare Other | Source: Ambulatory Visit | Attending: Urology | Admitting: Urology

## 2016-09-22 DIAGNOSIS — Z833 Family history of diabetes mellitus: Secondary | ICD-10-CM | POA: Diagnosis not present

## 2016-09-22 DIAGNOSIS — Z91030 Bee allergy status: Secondary | ICD-10-CM | POA: Diagnosis not present

## 2016-09-22 DIAGNOSIS — Z87442 Personal history of urinary calculi: Secondary | ICD-10-CM | POA: Insufficient documentation

## 2016-09-22 DIAGNOSIS — I10 Essential (primary) hypertension: Secondary | ICD-10-CM | POA: Insufficient documentation

## 2016-09-22 DIAGNOSIS — Z8249 Family history of ischemic heart disease and other diseases of the circulatory system: Secondary | ICD-10-CM | POA: Diagnosis not present

## 2016-09-22 DIAGNOSIS — M48061 Spinal stenosis, lumbar region without neurogenic claudication: Secondary | ICD-10-CM | POA: Insufficient documentation

## 2016-09-22 DIAGNOSIS — Z880 Allergy status to penicillin: Secondary | ICD-10-CM | POA: Insufficient documentation

## 2016-09-22 DIAGNOSIS — Z885 Allergy status to narcotic agent status: Secondary | ICD-10-CM | POA: Diagnosis not present

## 2016-09-22 DIAGNOSIS — Z961 Presence of intraocular lens: Secondary | ICD-10-CM | POA: Diagnosis not present

## 2016-09-22 DIAGNOSIS — M1990 Unspecified osteoarthritis, unspecified site: Secondary | ICD-10-CM | POA: Insufficient documentation

## 2016-09-22 DIAGNOSIS — Z8261 Family history of arthritis: Secondary | ICD-10-CM | POA: Diagnosis not present

## 2016-09-22 DIAGNOSIS — Z9841 Cataract extraction status, right eye: Secondary | ICD-10-CM | POA: Insufficient documentation

## 2016-09-22 DIAGNOSIS — F329 Major depressive disorder, single episode, unspecified: Secondary | ICD-10-CM | POA: Insufficient documentation

## 2016-09-22 DIAGNOSIS — Z823 Family history of stroke: Secondary | ICD-10-CM | POA: Insufficient documentation

## 2016-09-22 DIAGNOSIS — G20 Parkinson's disease: Secondary | ICD-10-CM | POA: Diagnosis not present

## 2016-09-22 DIAGNOSIS — Z888 Allergy status to other drugs, medicaments and biological substances status: Secondary | ICD-10-CM | POA: Diagnosis not present

## 2016-09-22 DIAGNOSIS — Z87891 Personal history of nicotine dependence: Secondary | ICD-10-CM | POA: Insufficient documentation

## 2016-09-22 DIAGNOSIS — Z96642 Presence of left artificial hip joint: Secondary | ICD-10-CM | POA: Insufficient documentation

## 2016-09-22 DIAGNOSIS — M545 Low back pain: Secondary | ICD-10-CM | POA: Diagnosis not present

## 2016-09-22 DIAGNOSIS — Z9842 Cataract extraction status, left eye: Secondary | ICD-10-CM | POA: Insufficient documentation

## 2016-09-22 DIAGNOSIS — C672 Malignant neoplasm of lateral wall of bladder: Secondary | ICD-10-CM | POA: Diagnosis not present

## 2016-09-22 DIAGNOSIS — C679 Malignant neoplasm of bladder, unspecified: Secondary | ICD-10-CM | POA: Diagnosis not present

## 2016-09-22 DIAGNOSIS — N3090 Cystitis, unspecified without hematuria: Secondary | ICD-10-CM | POA: Diagnosis not present

## 2016-09-22 HISTORY — DX: Malignant neoplasm of bladder, unspecified: C67.9

## 2016-09-22 HISTORY — DX: Dry eye syndrome of bilateral lacrimal glands: H04.123

## 2016-09-22 HISTORY — PX: CYSTOSCOPY W/ RETROGRADES: SHX1426

## 2016-09-22 HISTORY — DX: Spinal stenosis, lumbar region without neurogenic claudication: M48.061

## 2016-09-22 HISTORY — DX: Cervicalgia: M54.2

## 2016-09-22 HISTORY — DX: Gross hematuria: R31.0

## 2016-09-22 HISTORY — DX: Essential (primary) hypertension: I10

## 2016-09-22 HISTORY — PX: TRANSURETHRAL RESECTION OF BLADDER TUMOR: SHX2575

## 2016-09-22 HISTORY — DX: Spondylosis without myelopathy or radiculopathy, cervical region: M47.812

## 2016-09-22 LAB — POCT I-STAT, CHEM 8
BUN: 22 mg/dL — AB (ref 6–20)
BUN: 31 mg/dL — AB (ref 6–20)
BUN: 32 mg/dL — AB (ref 6–20)
CHLORIDE: 102 mmol/L (ref 101–111)
CHLORIDE: 98 mmol/L — AB (ref 101–111)
CHLORIDE: 99 mmol/L — AB (ref 101–111)
CREATININE: 0.6 mg/dL (ref 0.44–1.00)
CREATININE: 0.7 mg/dL (ref 0.44–1.00)
CREATININE: 0.7 mg/dL (ref 0.44–1.00)
Calcium, Ion: 1.25 mmol/L (ref 1.15–1.40)
Calcium, Ion: 0.92 mmol/L — ABNORMAL LOW (ref 1.15–1.40)
Calcium, Ion: 1.11 mmol/L — ABNORMAL LOW (ref 1.15–1.40)
GLUCOSE: 96 mg/dL (ref 65–99)
Glucose, Bld: 99 mg/dL (ref 65–99)
Glucose, Bld: 99 mg/dL (ref 65–99)
HEMATOCRIT: 40 % (ref 36.0–46.0)
HEMATOCRIT: 42 % (ref 36.0–46.0)
HEMATOCRIT: 43 % (ref 36.0–46.0)
HEMOGLOBIN: 13.6 g/dL (ref 12.0–15.0)
Hemoglobin: 14.3 g/dL (ref 12.0–15.0)
Hemoglobin: 14.6 g/dL (ref 12.0–15.0)
POTASSIUM: 3.6 mmol/L (ref 3.5–5.1)
Potassium: 6.4 mmol/L (ref 3.5–5.1)
Potassium: 6.7 mmol/L (ref 3.5–5.1)
Sodium: 138 mmol/L (ref 135–145)
Sodium: 133 mmol/L — ABNORMAL LOW (ref 135–145)
Sodium: 135 mmol/L (ref 135–145)
TCO2: 27 mmol/L (ref 0–100)
TCO2: 28 mmol/L (ref 0–100)
TCO2: 30 mmol/L (ref 0–100)

## 2016-09-22 SURGERY — TURBT (TRANSURETHRAL RESECTION OF BLADDER TUMOR)
Anesthesia: Monitor Anesthesia Care | Site: Renal

## 2016-09-22 MED ORDER — GENTAMICIN SULFATE 40 MG/ML IJ SOLN
5.0000 mg/kg | Freq: Once | INTRAMUSCULAR | Status: DC
  Filled 2016-09-22 (×2): qty 6.75

## 2016-09-22 MED ORDER — SODIUM CHLORIDE 0.9 % IR SOLN
Status: DC | PRN
  Administered 2016-09-22: 9000 mL

## 2016-09-22 MED ORDER — LIDOCAINE 2% (20 MG/ML) 5 ML SYRINGE
INTRAMUSCULAR | Status: AC
  Filled 2016-09-22: qty 5

## 2016-09-22 MED ORDER — HYDROCODONE-ACETAMINOPHEN 5-325 MG PO TABS: 0.5000 | ORAL_TABLET | Freq: Four times a day (QID) | ORAL | 0 refills | Status: DC | PRN

## 2016-09-22 MED ORDER — MEPERIDINE HCL 25 MG/ML IJ SOLN
6.2500 mg | INTRAMUSCULAR | Status: DC | PRN
  Filled 2016-09-22: qty 1

## 2016-09-22 MED ORDER — SENNOSIDES-DOCUSATE SODIUM 8.6-50 MG PO TABS: 1.0000 | ORAL_TABLET | Freq: Two times a day (BID) | ORAL | 0 refills | Status: DC

## 2016-09-22 MED ORDER — IOHEXOL 300 MG/ML  SOLN
INTRAMUSCULAR | Status: DC | PRN
  Administered 2016-09-22: 20 mL

## 2016-09-22 MED ORDER — ONDANSETRON HCL 4 MG/2ML IJ SOLN
INTRAMUSCULAR | Status: AC
  Filled 2016-09-22: qty 2

## 2016-09-22 MED ORDER — LIDOCAINE HCL (CARDIAC) 20 MG/ML IV SOLN
INTRAVENOUS | Status: DC | PRN
  Administered 2016-09-22: 40 mg via INTRAVENOUS

## 2016-09-22 MED ORDER — EPHEDRINE 5 MG/ML INJ
INTRAVENOUS | Status: AC
  Filled 2016-09-22: qty 10

## 2016-09-22 MED ORDER — PROPOFOL 10 MG/ML IV BOLUS
INTRAVENOUS | Status: AC
  Filled 2016-09-22: qty 20

## 2016-09-22 MED ORDER — PROPOFOL 10 MG/ML IV BOLUS
INTRAVENOUS | Status: DC | PRN
  Administered 2016-09-22: 10 mg via INTRAVENOUS
  Administered 2016-09-22 (×3): 20 mg via INTRAVENOUS

## 2016-09-22 MED ORDER — ONDANSETRON HCL 4 MG/2ML IJ SOLN
4.0000 mg | Freq: Once | INTRAMUSCULAR | Status: DC | PRN
  Filled 2016-09-22: qty 2

## 2016-09-22 MED ORDER — FENTANYL CITRATE (PF) 100 MCG/2ML IJ SOLN
INTRAMUSCULAR | Status: DC | PRN
  Administered 2016-09-22: 12.5 ug via INTRAVENOUS
  Administered 2016-09-22: 50 ug via INTRAVENOUS
  Administered 2016-09-22: 12.5 ug via INTRAVENOUS

## 2016-09-22 MED ORDER — LACTATED RINGERS IV SOLN
INTRAVENOUS | Status: DC
  Administered 2016-09-22 (×2): via INTRAVENOUS
  Filled 2016-09-22: qty 1000

## 2016-09-22 MED ORDER — ONDANSETRON HCL 4 MG/2ML IJ SOLN
INTRAMUSCULAR | Status: DC | PRN
  Administered 2016-09-22: 4 mg via INTRAVENOUS

## 2016-09-22 MED ORDER — FENTANYL CITRATE (PF) 100 MCG/2ML IJ SOLN
INTRAMUSCULAR | Status: AC
  Filled 2016-09-22: qty 2

## 2016-09-22 MED ORDER — GENTAMICIN IN SALINE 1.6-0.9 MG/ML-% IV SOLN
80.0000 mg | INTRAVENOUS | Status: AC
  Administered 2016-09-22: 270 mg via INTRAVENOUS
  Filled 2016-09-22: qty 50

## 2016-09-22 MED ORDER — FENTANYL CITRATE (PF) 100 MCG/2ML IJ SOLN
25.0000 ug | INTRAMUSCULAR | Status: DC | PRN
  Administered 2016-09-22: 25 ug via INTRAVENOUS
  Filled 2016-09-22: qty 1

## 2016-09-22 MED ORDER — EPHEDRINE SULFATE 50 MG/ML IJ SOLN
INTRAMUSCULAR | Status: DC | PRN
  Administered 2016-09-22 (×2): 10 mg via INTRAVENOUS

## 2016-09-22 MED ORDER — BUPIVACAINE IN DEXTROSE 0.75-8.25 % IT SOLN
INTRATHECAL | Status: DC | PRN
  Administered 2016-09-22: 1.2 mL via INTRATHECAL

## 2016-09-22 SURGICAL SUPPLY — 36 items
BAG DRAIN URO-CYSTO SKYTR STRL (DRAIN) ×3 IMPLANT
BAG DRN ANRFLXCHMBR STRAP LEK (BAG)
BAG DRN UROCATH (DRAIN) ×2
BAG URINE DRAINAGE (UROLOGICAL SUPPLIES) IMPLANT
BAG URINE LEG 19OZ MD ST LTX (BAG) IMPLANT
BAG URINE LEG 500ML (DRAIN) IMPLANT
BASKET ZERO TIP NITINOL 2.4FR (BASKET) IMPLANT
BSKT STON RTRVL ZERO TP 2.4FR (BASKET)
CATH FOLEY 2WAY SLVR  5CC 22FR (CATHETERS)
CATH FOLEY 2WAY SLVR 30CC 20FR (CATHETERS) IMPLANT
CATH FOLEY 2WAY SLVR 5CC 22FR (CATHETERS) IMPLANT
CATH INTERMIT  6FR 70CM (CATHETERS) ×1 IMPLANT
CLOTH BEACON ORANGE TIMEOUT ST (SAFETY) ×3 IMPLANT
ELECT REM PT RETURN 9FT ADLT (ELECTROSURGICAL) ×3
ELECTRODE REM PT RTRN 9FT ADLT (ELECTROSURGICAL) ×2 IMPLANT
EVACUATOR MICROVAS BLADDER (UROLOGICAL SUPPLIES) IMPLANT
GLOVE BIO SURGEON STRL SZ7.5 (GLOVE) ×3 IMPLANT
GLOVE BIOGEL PI IND STRL 7.0 (GLOVE) IMPLANT
GLOVE BIOGEL PI INDICATOR 7.0 (GLOVE) ×1
GOWN STRL REUS W/ TWL LRG LVL3 (GOWN DISPOSABLE) ×4 IMPLANT
GOWN STRL REUS W/TWL LRG LVL3 (GOWN DISPOSABLE) ×6
GUIDEWIRE ANG ZIPWIRE 038X150 (WIRE) IMPLANT
GUIDEWIRE STR DUAL SENSOR (WIRE) ×3 IMPLANT
IV NS 1000ML (IV SOLUTION)
IV NS 1000ML BAXH (IV SOLUTION) ×2 IMPLANT
IV NS IRRIG 3000ML ARTHROMATIC (IV SOLUTION) ×5 IMPLANT
KIT ROOM TURNOVER WOR (KITS) ×3 IMPLANT
LOOP CUT BIPOLAR 24F LRG (ELECTROSURGICAL) ×1 IMPLANT
MANIFOLD NEPTUNE II (INSTRUMENTS) ×3 IMPLANT
NS IRRIG 500ML POUR BTL (IV SOLUTION) ×3 IMPLANT
PACK CYSTO (CUSTOM PROCEDURE TRAY) ×3 IMPLANT
SET ASPIRATION TUBING (TUBING) IMPLANT
SYRINGE 10CC LL (SYRINGE) ×3 IMPLANT
SYRINGE IRR TOOMEY STRL 70CC (SYRINGE) IMPLANT
TUBE CONNECTING 12X1/4 (SUCTIONS) ×1 IMPLANT
TUBE FEEDING 8FR 16IN STR KANG (MISCELLANEOUS) ×2 IMPLANT

## 2016-09-22 NOTE — Anesthesia Procedure Notes (Signed)
Spinal  Patient location during procedure: OR Start time: 09/22/2016 9:35 AM End time: 09/22/2016 9:40 AM Staffing Anesthesiologist: Lyn Hollingshead Performed: anesthesiologist  Preanesthetic Checklist Completed: patient identified, surgical consent, pre-op evaluation, timeout performed, IV checked, risks and benefits discussed and monitors and equipment checked Spinal Block Patient position: sitting Prep: Betadine and site prepped and draped Patient monitoring: heart rate, cardiac monitor, continuous pulse ox and blood pressure Approach: midline Location: L3-4 Injection technique: single-shot Needle Needle type: Quincke  Needle gauge: 22 G Needle length: 9 cm Needle insertion depth: 4 cm Assessment Sensory level: T10

## 2016-09-22 NOTE — Brief Op Note (Signed)
09/22/2016  10:26 AM  PATIENT:  Stephanie [name]  82 y.o. female  PRE-OPERATIVE DIAGNOSIS:  BLADDER CANCER  POST-OPERATIVE DIAGNOSIS:  BLADDER CANCER  PROCEDURE:  Procedure(s): TRANSURETHRAL RESECTION OF BLADDER TUMOR (TURBT) (N/A) CYSTOSCOPY WITH RETROGRADE PYELOGRAM (Bilateral)  SURGEON:  Surgeon(s) and Role:    * Alexis Frock, MD - Primary  PHYSICIAN ASSISTANT:   ASSISTANTS: none   ANESTHESIA:   spinal and MAC  EBL:  Total I/O In: 600 [I.V.:600] Out: 5 [Blood:5]  BLOOD ADMINISTERED:none  DRAINS: none   LOCAL MEDICATIONS USED:  NONE  SPECIMEN:  Source of Specimen:  1 - random bladder biopsies, 2 - bladder tumor, 3 - base of bladder tumor  DISPOSITION OF SPECIMEN:  PATHOLOGY  COUNTS:  YES  TOURNIQUET:  * No tourniquets in log *  DICTATION: .Other Dictation: Dictation Number [id number]  PLAN OF CARE: Discharge to home after PACU  PATIENT DISPOSITION:  PACU - hemodynamically stable.   Delay start of Pharmacological VTE agent (>24hrs) due to surgical blood loss or risk of bleeding: yes

## 2016-09-22 NOTE — Anesthesia Postprocedure Evaluation (Signed)
Anesthesia Post Note  Patient: Stephanie [name]  Procedure(s) Performed: Procedure(s) (LRB): TRANSURETHRAL RESECTION OF BLADDER TUMOR (TURBT) (N/A) CYSTOSCOPY WITH RETROGRADE PYELOGRAM (Bilateral)  Patient location during evaluation: PACU Anesthesia Type: Spinal Level of consciousness: awake Pain management: pain level controlled Vital Signs Assessment: post-procedure vital signs reviewed and stable Respiratory status: spontaneous breathing Cardiovascular status: stable Postop Assessment: no headache, no backache, spinal receding, patient able to bend at knees and no signs of nausea or vomiting Anesthetic complications: no     Last Vitals:  Vitals:   09/22/16 1315 09/22/16 1330  BP: (!) 159/74   Pulse: 84 87  Resp: 16 15  Temp:      Last Pain:  Vitals:   09/22/16 1200  TempSrc:   PainSc: 2    Pain Goal: Patients Stated Pain Goal: 6 (09/22/16 [id number])               Modestine Scherzinger JR,JOHN Mateo Flow

## 2016-09-22 NOTE — Anesthesia Procedure Notes (Addendum)
Procedure Name: MAC Date/Time: 09/22/2016 9:30 AM Performed by: Lyn Hollingshead Pre-anesthesia Checklist: Patient identified, Emergency Drugs available, Suction available, Patient being monitored and Timeout performed Patient Re-evaluated:Patient Re-evaluated prior to inductionOxygen Delivery Method: Simple face mask Placement Confirmation: positive ETCO2 and breath sounds checked- equal and bilateral

## 2016-09-22 NOTE — Discharge Instructions (Signed)
CYSTOSCOPY HOME CARE INSTRUCTIONS  Activity: Rest for the remainder of the day.  Do not drive or operate equipment today.  You may resume normal activities in one to two days as instructed by your physician.   Meals: Drink plenty of liquids and eat light foods such as gelatin or soup this evening.  You may return to a normal meal plan tomorrow.  Return to Work: You may return to work in one to two days or as instructed by your physician.  Special Instructions / Symptoms: Call your physician if any of these symptoms occur:   -persistent or heavy bleeding  -bleeding which continues after first few urination  -large blood clots that are difficult to pass  -urine stream diminishes or stops completely  -fever equal to or higher than 101 degrees Farenheit.  -cloudy urine with a strong, foul odor  -severe pain  Females should always wipe from front to back after elimination.  You may feel some burning pain when you urinate.  This should disappear with time.  Applying moist heat to the lower abdomen or a hot tub bath may help relieve the pain. \  Follow-Up / Date of Return Visit to Your Physician:   Call for an appointment to arrange follow-up.  Patient Signature:  ________________________________________________________  Nurse's Signature:  ________________________________________________________  Post Anesthesia Home Care Instructions  Activity: Get plenty of rest for the remainder of the day. A responsible adult should stay with you for 24 hours following the procedure.  For the next 24 hours, DO NOT: -Drive a car -Paediatric nurse -Drink alcoholic beverages -Take any medication unless instructed by your physician -Make any legal decisions or sign important papers.  Meals: Start with liquid foods such as gelatin or soup. Progress to regular foods as tolerated. Avoid greasy, spicy, heavy foods. If nausea and/or vomiting occur, drink only clear liquids until the nausea and/or  vomiting subsides. Call your physician if vomiting continues.  Special Instructions/Symptoms: Your throat may feel dry or sore from the anesthesia or the breathing tube placed in your throat during surgery. If this causes discomfort, gargle with warm salt water. The discomfort should disappear within 24 hours.  If you had a scopolamine patch placed behind your ear for the management of post- operative nausea and/or vomiting:  1. The medication in the patch is effective for 72 hours, after which it should be removed.  Wrap patch in a tissue and discard in the trash. Wash hands thoroughly with soap and water. 2. You may remove the patch earlier than 72 hours if you experience unpleasant side effects which may include dry mouth, dizziness or visual disturbances. 3. Avoid touching the patch. Wash your hands with soap and water after contact with the patch.   1 - You may have urinary urgency (bladder spasms) and bloody urine on / off x few days. This is normal.  2 - Call MD or go to ER for fever >102, severe pain / nausea / vomiting not relieved by medications, or acute change in medical status

## 2016-09-22 NOTE — Transfer of Care (Signed)
Immediate Anesthesia Transfer of Care Note  Patient: Stephanie [name]  Procedure(s) Performed: Procedure(s) (LRB): TRANSURETHRAL RESECTION OF BLADDER TUMOR (TURBT) (N/A) CYSTOSCOPY WITH RETROGRADE PYELOGRAM (Bilateral)  Patient Location: PACU  Anesthesia Type: MAC  Level of Consciousness: awake, alert , oriented and patient cooperative  Airway & Oxygen Therapy: Patient Spontanous Breathing and Patient connected to face mask oxygen  Post-op Assessment: Report given to PACU RN and Post -op Vital signs reviewed and stable  Post vital signs: Reviewed and stable  Complications: No apparent anesthesia complications

## 2016-09-22 NOTE — Progress Notes (Addendum)
ISTAT with critical potassium value-sample redrawn -with normal value results ,3.6 potassium

## 2016-09-22 NOTE — H&P (Signed)
Stephanie [name] is an 82 y.o. female.    Chief Complaint: Pre-op Transurethral Resection of Bladder Tumor  HPI:    1 - Bladder Cancer - Rt wall papillary tumor near UO found on cysto eval gross hematuria 2017. Remote 20PY smoker. Staging CT w/o pelvic adenopathy or obvious distatnt disease.   2 - Small Right Renal Mass - 1.2cm extreme upper pole solid enhancing mass incidental on hematuria CT 2017. 1.2cm, 80% exophytic, no mass effect. Appears 1 artery / 1 vein right renovascular anatomy.   3 - Gross Hematuria - new gross hematuria after starting peri-op Xarelto after hip replacement 2017. Cysto with small bladder tumor and small renal mass as per above.   PMH sig for parkinsonism (well controlled, follows [facility] at [facility]), hip replacement. NO CV disease. Marland Kitchen Her PCP is Gwyneth Revels mD.   Today "Stephanie [name]" is seen to proceed with TURBT and retrogrades for her small bladder cancer for diagnostic and theraputic intent. Most recent UA without infectious parameters.    Past Medical History:  Diagnosis Date  . Benign hypertension without congestive heart failure   . Benign mole    right hcets mole, bleeds when dried with towel  . Bilateral dry eyes   . Bladder cancer ([place])   . Cervical spondylosis   . Cervicalgia   . Gross hematuria   . History of kidney stones    1970's  . Lumbar stenosis   . OA (osteoarthritis)   . Parkinson disease [facility]) dx 2012   neurologist-  dr siddiqui at [facility]    Past Surgical History:  Procedure Laterality Date  . BIOPSY MASS LEFT FIRST METACARPAL   06/23/2006   benign  . CATARACT EXTRACTION W/ INTRAOCULAR LENS  IMPLANT, BILATERAL  1999 and 2003  . D & C HYSTEROSCOPY W/ RESECTION POLYP  07/11/2000  . DILATION AND CURETTAGE OF UTERUS  1960's  . EXCISION CYST AND DEBRIDEMENT RIGHT WIRST AND REMOVAL [place] BODY  01/09/2009  . LAPAROSCOPY  yrs ago   infertility   . METACARPOPHALANGEAL JOINT ARTHRODESIS Right 05/25/1996  . TONSILLECTOMY AND  ADENOIDECTOMY  child  . TOTAL HIP ARTHROPLASTY Left 07/07/2016   Procedure: LEFT TOTAL HIP ARTHROPLASTY ANTERIOR APPROACH;  Surgeon: Gaynelle Arabian, MD;  Location: [facility];  Service: Orthopedics;  Laterality: Left;    Family History  Problem Relation Age of Onset  . Arthritis Mother   . Hypertension Mother   . Deep vein thrombosis Mother     recurrent, secondary to Bleeding disorder  . Arthritis Father   . Stroke Father   . Diabetes Sister   . Heart disease Sister   . Obesity Sister   . Arthritis Sister   . Hypertension Sister   . Heart disease Maternal Grandmother   . Heart disease Maternal Grandfather   . Peripheral vascular disease Paternal Grandmother     s/p leg amputation  . Stroke Paternal Grandfather    Social History:  reports that she quit smoking about 52 years ago. She quit after 10.00 years of use. She has never used smokeless tobacco. She reports that she drinks about 2.0 oz of alcohol per week . She reports that she does not use drugs.  Allergies:  Allergies  Allergen Reactions  . Bee Venom Swelling    Yellow jackets, white and black face hornets, local swelling only  . Codeine Nausea And Vomiting  . Penicillins Itching and Swelling  . Lodine [Etodolac] Rash  . Tramadol Itching and Rash    No  prescriptions prior to admission.    No results found for this or any previous visit (from the past 48 hour(s)). No results found.  Review of Systems  Constitutional: Negative.  Negative for chills and fever.  HENT: Negative.   Eyes: Negative.   Respiratory: Negative.   Cardiovascular: Negative.   Gastrointestinal: Negative.   Genitourinary: Positive for hematuria.  Musculoskeletal: Negative.   Skin: Negative.   Neurological: Negative.   Endo/Heme/Allergies: Negative.   Psychiatric/Behavioral: Negative.     Height 5' 4.5" (1.638 m), weight 54.9 kg (121 lb). Physical Exam  Constitutional: She appears well-developed.  HENT:  Head: Normocephalic.  Eyes:  Pupils are equal, round, and reactive to light.  Neck: Normal range of motion.  Cardiovascular: Normal rate.   Respiratory: Effort normal.  GI: Soft.  Genitourinary:  Genitourinary Comments: NO CVAT  Musculoskeletal: Normal range of motion.  Neurological: She is alert.  Skin: Skin is warm.  Psychiatric: She has a normal mood and affect.     Assessment/Plan  Proceed as planned with TURBT, retrogrades, possible Mito-C for small bladder cancer. Risks, benefits, alternatives, expected peri-op course, need for possible temporary ureteral stent and / or foley discussed previously and reiterated today.   Alexis Frock, MD 09/22/2016, 6:14 AM

## 2016-09-22 NOTE — Anesthesia Preprocedure Evaluation (Signed)
Anesthesia Evaluation  Patient identified by MRN, date of birth, ID band  Reviewed: Allergy & Precautions, H&P , NPO status , Patient's Chart, lab work & pertinent test results  Airway Mallampati: II  TM Distance: >3 FB Neck ROM: Full    Dental no notable dental hx. (+) Teeth Intact, Dental Advisory Given   Pulmonary former smoker,    Pulmonary exam normal        Cardiovascular hypertension, Pt. on medications Normal cardiovascular exam     Neuro/Psych Depression  Neuromuscular disease    GI/Hepatic negative GI ROS, Neg liver ROS,   Endo/Other  negative endocrine ROS  Renal/GU negative Renal ROS  negative genitourinary   Musculoskeletal  (+) Arthritis , Osteoarthritis,    Abdominal Normal abdominal exam  (+)   Peds  Hematology negative hematology ROS (+)   Anesthesia Other Findings   Reproductive/Obstetrics negative OB ROS                             Anesthesia Physical  Anesthesia Plan  ASA: II  Anesthesia Plan: MAC and Spinal   Post-op Pain Management:    Induction: Intravenous  Airway Management Planned: Simple Face Mask  Additional Equipment:   Intra-op Plan:   Post-operative Plan:   Informed Consent: I have reviewed the patients History and Physical, chart, labs and discussed the procedure including the risks, benefits and alternatives for the proposed anesthesia with the patient or authorized representative who has indicated his/her understanding and acceptance.     Plan Discussed with: CRNA and Surgeon  Anesthesia Plan Comments:         Anesthesia Quick Evaluation

## 2016-09-23 ENCOUNTER — Encounter (HOSPITAL_BASED_OUTPATIENT_CLINIC_OR_DEPARTMENT_OTHER): Payer: Self-pay | Admitting: Urology

## 2016-09-23 NOTE — Op Note (Signed)
NAMEALEXANDER, CRITCHLEY NO.:  [id number]  MEDICAL RECORD NO.:  [id number]  LOCATION:                                 FACILITY:  PHYSICIAN:  Alexis Frock, MD     DATE OF BIRTH:  1932/08/12  DATE OF PROCEDURE: 09/22/2016                              OPERATIVE REPORT   DIAGNOSIS:  Bladder cancer.  PROCEDURES: 1. Cystoscopy with bilateral retrograde pyelograms and interpretation. 2. Transurethral resection of bladder tumor, volume medium.  ESTIMATED BLOOD LOSS:  Nil.  COMPLICATION:  None.  SPECIMENS: 1. Random bladder biopsies for permanent pathology. 2. Bladder tumor fragments for permanent pathology. 3. Base of bladder tumor for permanent pathology.  FINDINGS: 1. Dominant papillary tumor just superolateral to the right ureteral     orifice.  Photodocumentation performed before and after resection. 2. Some erythema and subtle papillary changes throughout the urinary     bladder, random biopsies obtained. 3. Unremarkable bilateral retrograde pyelograms. 4. Total volume papillary tumor approximately 3.5 cm.  INDICATION:  Stephanie [name] is a quite pleasant and vigorous 81 year old lady who was found on workup of gross hematuria to have a papillary lesion on her bladder consistent with localized urothelial carcinoma. CT scan revealed no additional obvious causes of her hematuria.  She was referred for consideration of operative management.  We discussed a transurethral resection as being the optimal plan for initial diagnostic and staging purposes and she wished to proceed.  Informed consent was obtained and placed in the medical record.  PROCEDURE IN DETAIL:  The patient being Stephanie [name], was verified. Procedure being transurethral resection of bladder tumor with retrograde was confirmed.  Procedure was carried out.  Time-out was performed. Intravenous antibiotics were administered.  Spinal anesthesia was introduced with monitored anesthesia care.  The  patient was placed into a low lithotomy position and sterile field was created by prepping and draping the patient's vagina, introitus, and proximal thighs.  Next, cystourethroscopy was performed using a 21-French rigid cystoscope with offset lens.  Inspection of the urinary bladder revealed a dominant papillary tumor just superolateral to the right ureteral orifice, it did not involve the ureteral orifice directly.  There was also some subtle erythema and papillary changes and several other aspects of the bladder. Photodocumentation performed.  Initial attention was directed to retrograde pyelography.  The left ureteral orifice was cannulated with a 6-French end-hole catheter and left retrograde pyelogram was obtained.  Left retrograde pyelogram demonstrated a single left ureter with single- system left kidney.  No filling defects or narrowing noted.  There was some mild caliectasis consistent with probable subtle UPJ on the left. Similarly, right retrograde pyelogram was obtained.  Right retrograde pyelogram demonstrated a single right ureter with single-system right kidney.  No filling defects or narrowing noted. Attention was then directed to random bladder biopsies.  Three representative areas of the subtle bladder erythema were sampled using cold-cup biopsy forceps from the right wall, left wall and inter-trigone area, set aside for permanent pathology.  Next, resectoscope loop was used to fulgurate the prior biopsy sites, which resulted in excellent hemostasis of those structures.  There was no evidence of perforation at this site.  Next, very  careful systematic resection was performed of the dominant papillary tumor, taking exquisite care to avoid direct injury to the ureteral orifice or application of direct energy within 1 cm to the ureteral orifice, which did not occur.  This resulted in excellent removal of all visible papillary tumor in this location.  The bladder tumor  fragments were set aside for permanent pathology.  Next, cold-cup biopsy forceps were once again used to sample the deep aspect of this, purposely somewhat superficial given likely intramural ureter underneath this.  This was set aside, separately labeled as base of bladder tumor. Additional coagulation current was applied to the base of the area, which resulted in excellent hemostasis.  Very careful inspection was performed at the entire aspect of the bladder.  There was no evidence of perforation.  There was excellent hemostasis.  There was complete resolution of all obvious papillary tumor.  It was not felt that interval stenting would be warranted as the right ureteral orifice remained visibly patent with excellent ureteral jets and it was not felt that interval urethral catheterization would be warranted.  As such, the bladder was emptied per cystoscope, procedure was then terminated.  The patient tolerated the procedure well.  There were no immediate periprocedural complications.  The patient was taken to the postanesthesia care unit in stable condition.          ______________________________ Alexis Frock, MD     TM/MEDQ  D:  09/22/2016  T:  09/23/2016  Job:  [id number]

## 2016-09-27 DIAGNOSIS — M9903 Segmental and somatic dysfunction of lumbar region: Secondary | ICD-10-CM | POA: Diagnosis not present

## 2016-09-27 DIAGNOSIS — M545 Low back pain: Secondary | ICD-10-CM | POA: Diagnosis not present

## 2016-09-27 DIAGNOSIS — M9901 Segmental and somatic dysfunction of cervical region: Secondary | ICD-10-CM | POA: Diagnosis not present

## 2016-09-27 DIAGNOSIS — M542 Cervicalgia: Secondary | ICD-10-CM | POA: Diagnosis not present

## 2016-09-28 DIAGNOSIS — M9903 Segmental and somatic dysfunction of lumbar region: Secondary | ICD-10-CM | POA: Diagnosis not present

## 2016-09-28 DIAGNOSIS — M545 Low back pain: Secondary | ICD-10-CM | POA: Diagnosis not present

## 2016-09-28 DIAGNOSIS — M9901 Segmental and somatic dysfunction of cervical region: Secondary | ICD-10-CM | POA: Diagnosis not present

## 2016-09-28 DIAGNOSIS — M542 Cervicalgia: Secondary | ICD-10-CM | POA: Diagnosis not present

## 2016-10-01 ENCOUNTER — Ambulatory Visit (INDEPENDENT_AMBULATORY_CARE_PROVIDER_SITE_OTHER): Payer: Medicare Other | Admitting: Family Medicine

## 2016-10-01 DIAGNOSIS — M62838 Other muscle spasm: Secondary | ICD-10-CM | POA: Diagnosis not present

## 2016-10-01 DIAGNOSIS — M169 Osteoarthritis of hip, unspecified: Secondary | ICD-10-CM | POA: Diagnosis not present

## 2016-10-01 DIAGNOSIS — C679 Malignant neoplasm of bladder, unspecified: Secondary | ICD-10-CM

## 2016-10-01 DIAGNOSIS — E785 Hyperlipidemia, unspecified: Secondary | ICD-10-CM | POA: Diagnosis not present

## 2016-10-01 DIAGNOSIS — I10 Essential (primary) hypertension: Secondary | ICD-10-CM

## 2016-10-01 DIAGNOSIS — F418 Other specified anxiety disorders: Secondary | ICD-10-CM

## 2016-10-01 DIAGNOSIS — M1990 Unspecified osteoarthritis, unspecified site: Secondary | ICD-10-CM

## 2016-10-01 NOTE — Progress Notes (Signed)
Pre visit review using our clinic review tool, if applicable. No additional management support is needed unless otherwise documented below in the visit note. 

## 2016-10-01 NOTE — Patient Instructions (Signed)
Bladder Cancer Bladder cancer is an abnormal growth of tissue in your bladder. Your bladder is the balloon-like sac in your pelvis. It collects and stores urine that comes from the kidneys through the ureters. The bladder wall is made of layers. If cancer spreads into these layers and through the wall of the bladder, it becomes more difficult to treat.  There are four stages of bladder cancer:  Stage I. Cancer at this stage occurs in the bladder's inner lining but has not invaded the muscular bladder wall.  Stage II. At this stage, cancer has invaded the bladder wall but is still confined to the bladder.  Stage III. By this stage, the cancer cells have spread through the bladder wall to surrounding tissue. They may also have spread to the prostate in men or the uterus or vagina in women.  Stage IV. By this stage, cancer cells may have spread to the lymph nodes and other organs, such as your lungs, bones, or liver. RISK FACTORS Although the cause of bladder cancer is not known, the following risk factors can increase your chances of getting bladder cancer:   Smoking.   Occupational exposures, such as rubber, leather, textile, dyes, chemicals, and paint.  Being white.  Age.   Being female.   Having chronic bladder inflammation.   Having a bladder cancer history.   Having a family history of bladder cancer (heredity).   Having had chemotherapy or radiation therapy to the pelvis.   Being exposed to arsenic.  SYMPTOMS   Blood in the urine.   Pain with urination.   Frequent bladder or urine infections.  Increase in urgency and frequency of urination. DIAGNOSIS  Your health care provider may suspect bladder cancer based on your description of urinary symptoms or based on the finding of blood or infection in the urine (especially if this has recurred several times). Other tests or procedures that may be performed include:   A narrow tube being inserted into your bladder  through your urethra (cystoscopy) in order to view the lining of your bladder for tumors.   A biopsy to sample the tumor to see if cancer is present.  If cancer is present, it will then be staged to determine its severity and extent. It is important to know how deeply into the bladder wall the cancer has grown and whether the cancer has spread to any other parts of your body. Staging may require blood tests or special scans such as a CT scan, MRI, bone scan, or chest X-ray.  TREATMENT  Once your cancer has been diagnosed and staged, you should discuss a treatment plan with your health care provider. Based on the stage of the cancer, one treatment or a combination of treatments may be recommended. The most common forms of treatment are:   Surgery. Procedures that may be done include transurethral resection and cystectomy.  Radiation therapy. This is infrequently used to treat bladder cancer.   Chemotherapy. During this treatment, drugs are used to kill cancer cells.  Immunotherapy. This is usually administered directly into the bladder. HOME CARE INSTRUCTIONS  Take medicines only as directed by your health care provider.   Maintain a healthy diet.   Consider joining a support group. This may help you learn to cope with the stress of having bladder cancer.   Seek advice to help you manage treatment side effects.   Keep all follow-up visits as directed by your health care provider.   Inform your cancer specialist if you are  admitted to the hospital.  [place] IF:  There is blood in your urine.  You have symptoms of a urinary tract infection. These include:  Tiredness.  Shakiness.  Weakness.  Muscle aches.  Abdominal pain.  Frequent and intense urge to urinate (in young women).  Burning feeling in the bladder or urethra during urination (in young women). SEEK IMMEDIATE MEDICAL CARE IF:  You are unable to urinate. This information is not intended to  replace advice given to you by your health care provider. Make sure you discuss any questions you have with your health care provider. Document Released: 11/04/2003 Document Revised: 11/22/2014 Document Reviewed: 04/24/2013 Elsevier Interactive Patient Education  2017 [employer].

## 2016-10-04 DIAGNOSIS — M542 Cervicalgia: Secondary | ICD-10-CM | POA: Diagnosis not present

## 2016-10-04 DIAGNOSIS — M545 Low back pain: Secondary | ICD-10-CM | POA: Diagnosis not present

## 2016-10-04 DIAGNOSIS — M9901 Segmental and somatic dysfunction of cervical region: Secondary | ICD-10-CM | POA: Diagnosis not present

## 2016-10-04 DIAGNOSIS — M9903 Segmental and somatic dysfunction of lumbar region: Secondary | ICD-10-CM | POA: Diagnosis not present

## 2016-10-05 DIAGNOSIS — M9903 Segmental and somatic dysfunction of lumbar region: Secondary | ICD-10-CM | POA: Diagnosis not present

## 2016-10-05 DIAGNOSIS — M9901 Segmental and somatic dysfunction of cervical region: Secondary | ICD-10-CM | POA: Diagnosis not present

## 2016-10-05 DIAGNOSIS — M545 Low back pain: Secondary | ICD-10-CM | POA: Diagnosis not present

## 2016-10-05 DIAGNOSIS — M542 Cervicalgia: Secondary | ICD-10-CM | POA: Diagnosis not present

## 2016-10-15 ENCOUNTER — Telehealth: Payer: Self-pay | Admitting: *Deleted

## 2016-10-15 NOTE — Telephone Encounter (Signed)
Not available

## 2016-10-15 NOTE — Telephone Encounter (Signed)
Patient called back returning your call. Not sure why it came to my phone.

## 2016-10-17 ENCOUNTER — Encounter: Payer: Self-pay | Admitting: Family Medicine

## 2016-10-17 DIAGNOSIS — C679 Malignant neoplasm of bladder, unspecified: Secondary | ICD-10-CM | POA: Insufficient documentation

## 2016-10-17 DIAGNOSIS — F418 Other specified anxiety disorders: Secondary | ICD-10-CM

## 2016-10-17 HISTORY — DX: Malignant neoplasm of bladder, unspecified: C67.9

## 2016-10-17 HISTORY — DX: Other specified anxiety disorders: F41.8

## 2016-10-17 NOTE — Assessment & Plan Note (Signed)
Flared in neck but is following with chiropractor Dr Karena Addison and that is helping. She is sleeping well.

## 2016-10-17 NOTE — Assessment & Plan Note (Signed)
Biopsy proven awaiting follow up with urology. Has not had any significant bleeding since her biopsy

## 2016-10-17 NOTE — Progress Notes (Signed)
Patient ID: DETRIA CHRISCO, female   DOB: 25-Jul-1932, 82 y.o.   MRN: [id number]   Subjective:    Patient ID: [name], female    DOB: 15-Apr-1932, 82 y.o.   MRN: [id number]  Chief Complaint  Patient presents with  . Follow-up    HPI Patient is in today for follow up. She is struggling with a recent diagnosis of bladder cancer which has recently been biopsy proven. No further bleeding since biopsy. No abdominal pain or fevers. Her hip is still uncomfortable at times s/p replacement but much better. She was also widowed last month so she is grieving and struggling with worsening depression. Denies CP/palp/SOB/HA/congestion/fevers/GI or GU c/o. Taking meds as prescribed  Past Medical History:  Diagnosis Date  . Benign hypertension without congestive heart failure   . Benign mole    right hcets mole, bleeds when dried with towel  . Bilateral dry eyes   . Bladder cancer ([place])   . Bladder cancer ([place]) 10/17/2016  . Cervical spondylosis   . Cervicalgia   . Depression with anxiety 10/17/2016  . Gross hematuria   . History of kidney stones    1970's  . Lumbar stenosis   . OA (osteoarthritis)   . Parkinson disease [facility]) dx 2012   neurologist-  dr siddiqui at [facility]    Past Surgical History:  Procedure Laterality Date  . BIOPSY MASS LEFT FIRST METACARPAL   06/23/2006   benign  . CATARACT EXTRACTION W/ INTRAOCULAR LENS  IMPLANT, BILATERAL  1999 and 2003  . CYSTOSCOPY W/ RETROGRADES Bilateral 09/22/2016   Procedure: CYSTOSCOPY WITH RETROGRADE PYELOGRAM;  Surgeon: Alexis Frock, MD;  Location: [facility];  Service: Urology;  Laterality: Bilateral;  . D & C HYSTEROSCOPY W/ RESECTION POLYP  07/11/2000  . DILATION AND CURETTAGE OF UTERUS  1960's  . EXCISION CYST AND DEBRIDEMENT RIGHT WIRST AND REMOVAL [place] BODY  01/09/2009  . LAPAROSCOPY  yrs ago   infertility   . METACARPOPHALANGEAL JOINT ARTHRODESIS Right 05/25/1996  . TONSILLECTOMY AND ADENOIDECTOMY  child    . TOTAL HIP ARTHROPLASTY Left 07/07/2016   Procedure: LEFT TOTAL HIP ARTHROPLASTY ANTERIOR APPROACH;  Surgeon: Gaynelle Arabian, MD;  Location[facility];  Service: Orthopedics;  Laterality: Left;  . TRANSURETHRAL RESECTION OF BLADDER TUMOR N/A 09/22/2016   Procedure: TRANSURETHRAL RESECTION OF BLADDER TUMOR (TURBT);  Surgeon: Alexis Frock, MD;  Location: [facility];  Service: Urology;  Laterality: N/A;    Family History  Problem Relation Age of Onset  . Arthritis Mother   . Hypertension Mother   . Deep vein thrombosis Mother     recurrent, secondary to Bleeding disorder  . Arthritis Father   . Stroke Father   . Diabetes Sister   . Heart disease Sister   . Obesity Sister   . Arthritis Sister   . Hypertension Sister   . Heart disease Maternal Grandmother   . Heart disease Maternal Grandfather   . Peripheral vascular disease Paternal Grandmother     s/p leg amputation  . Stroke Paternal Grandfather     Social History   Social History  . Marital status: Widowed    Spouse name: N/A  . Number of children: N/A  . Years of education: N/A   Occupational History  . Not on file.   Social History Main Topics  . Smoking status: Former Smoker    Years: 10.00    Quit date: 05/04/1964  . Smokeless tobacco: Never Used  . Alcohol  use 2.0 oz/week    4 Standard drinks or equivalent per week     Comment: social  . Drug use: No  . Sexual activity: No   Other Topics Concern  . Not on file   Social History Narrative  . No narrative on file    Outpatient Medications Prior to Visit  Medication Sig Dispense Refill  . acetaminophen (TYLENOL) 500 MG tablet Take 1,000 mg by mouth every 6 (six) hours as needed for moderate pain.    . carbidopa-levodopa (SINEMET IR) 25-100 MG per tablet Take 1 tablet by mouth 3 (three) times daily.     . diazepam (VALIUM) 5 MG tablet Take 1 tablet (5 mg total) by mouth 2 (two) times daily as needed for anxiety. 60 tablet 1  . diclofenac  (VOLTAREN) 50 MG EC tablet Take 50 mg by mouth 2 (two) times daily.    Marland Kitchen EPINEPHrine 0.3 mg/0.3 mL IJ SOAJ injection Inject 0.3 mLs (0.3 mg total) into the muscle once. (Patient taking differently: Inject 0.3 mg into the muscle once as needed (allergic reaction). ) 2 Device 2  . estradiol (ESTRACE) 0.5 MG tablet TAKE 1/2 TABLET BY MOUTH EVERY DAY (Patient taking differently: TAKE 1/2 TABLET BY MOUTH EVERY DAY--  takes in am) 45 tablet 3  . hydrochlorothiazide (HYDRODIURIL) 25 MG tablet Take 1.5 tablets (37.5 mg total) by mouth daily. (Patient taking differently: Take 37.5 mg by mouth every morning. Takes one and half tablets = 37.5mg ) 135 tablet 0  . medroxyPROGESTERone (PROVERA) 2.5 MG tablet TAKE 1/2 TABLET(1.25 MG) BY MOUTH DAILY (Patient taking differently: TAKE 1/2 TABLET(1.25 MG) BY MOUTH DAILY--- takes in am) 45 tablet 3  . Polyethyl Glycol-Propyl Glycol (SYSTANE) 0.4-0.3 % SOLN Apply 1 drop to eye every morning.    . senna-docusate (SENOKOT-S) 8.6-50 MG tablet Take 1 tablet by mouth 2 (two) times daily. While taking pain meds to prevent constipation. 20 tablet 0  . sertraline (ZOLOFT) 25 MG tablet TAKE 1 TABLET BY MOUTH EVERY DAY (Patient taking differently: TAKE 1 TABLET BY MOUTH EVERY DAY--  takes in am) 90 tablet 0  . HYDROcodone-acetaminophen (NORCO/VICODIN) 5-325 MG tablet Take 0.5-1 tablets by mouth every 6 (six) hours as needed for moderate pain. Post-operatively. Take with food. 15 tablet 0   No facility-administered medications prior to visit.     Allergies  Allergen Reactions  . Bee Venom Swelling    Yellow jackets, white and black face hornets, local swelling only  . Codeine Nausea And Vomiting  . Penicillins Itching and Swelling  . Lodine [Etodolac] Rash  . Tramadol Itching and Rash    Review of Systems  Constitutional: Negative for fever and malaise/fatigue.  HENT: Negative for congestion.   Eyes: Negative for blurred vision.  Respiratory: Negative for shortness of  breath.   Cardiovascular: Negative for chest pain, palpitations and leg swelling.  Gastrointestinal: Negative for abdominal pain, blood in stool and nausea.  Genitourinary: Negative for dysuria and frequency.  Musculoskeletal: Positive for joint pain. Negative for falls.  Skin: Negative for rash.  Neurological: Positive for tremors. Negative for dizziness, loss of consciousness and headaches.  Endo/Heme/Allergies: Negative for environmental allergies.  Psychiatric/Behavioral: Positive for depression. Negative for hallucinations, substance abuse and suicidal ideas. The patient is nervous/anxious. The patient does not have insomnia.        Objective:    Physical Exam  Constitutional: She is oriented to person, place, and time. She appears well-developed and well-nourished. No distress.  HENT:  Head: Normocephalic  and atraumatic.  Nose: Nose normal.  Eyes: Right eye exhibits no discharge. Left eye exhibits no discharge.  Neck: Normal range of motion. Neck supple.  Cardiovascular: Normal rate and regular rhythm.   No murmur heard. Pulmonary/Chest: Effort normal and breath sounds normal.  Abdominal: Soft. Bowel sounds are normal. There is no tenderness.  Musculoskeletal: She exhibits no edema.  Neurological: She is alert and oriented to person, place, and time.  Skin: Skin is warm and dry.  Psychiatric: She has a normal mood and affect.  Nursing note and vitals reviewed.   BP 128/72 (BP Location: Left Arm, Patient Position: Sitting, Cuff Size: Normal)   Pulse 77   Temp 97.9 F (36.6 C) (Oral)   Wt 124 lb 12.8 oz (56.6 kg)   LMP 01/13/2013 Comment: spotting-had benign endometrial biopsy   SpO2 97%   BMI 21.09 kg/m  Wt Readings from Last 3 Encounters:  10/01/16 124 lb 12.8 oz (56.6 kg)  09/22/16 119 lb 5 oz (54.1 kg)  07/07/16 117 lb (53.1 kg)     Lab Results  Component Value Date   WBC 6.7 07/19/2016   HGB 13.6 09/22/2016   HCT 40.0 09/22/2016   PLT 285 07/19/2016    GLUCOSE 96 09/22/2016   CHOL 186 06/11/2015   TRIG 117.0 06/11/2015   HDL 46.00 06/11/2015   LDLDIRECT 120.0 12/11/2014   LDLCALC 117 (H) 06/11/2015   ALT 21 06/29/2016   AST 35 06/29/2016   NA 138 09/22/2016   K 3.6 09/22/2016   CL 98 (L) 09/22/2016   CREATININE 0.70 09/22/2016   BUN 22 (H) 09/22/2016   CO2 28 07/19/2016   TSH 1.06 12/11/2014   INR 0.96 06/29/2016    Lab Results  Component Value Date   TSH 1.06 12/11/2014   Lab Results  Component Value Date   WBC 6.7 07/19/2016   HGB 13.6 09/22/2016   HCT 40.0 09/22/2016   MCV 85.6 07/19/2016   PLT 285 07/19/2016   Lab Results  Component Value Date   NA 138 09/22/2016   K 3.6 09/22/2016   CO2 28 07/19/2016   GLUCOSE 96 09/22/2016   BUN 22 (H) 09/22/2016   CREATININE 0.70 09/22/2016   BILITOT 1.0 06/29/2016   ALKPHOS 60 06/29/2016   AST 35 06/29/2016   ALT 21 06/29/2016   PROT 7.5 06/29/2016   ALBUMIN 5.2 (H) 06/29/2016   CALCIUM 8.7 (L) 07/19/2016   ANIONGAP 6 07/19/2016   GFR 78.75 06/11/2015   Lab Results  Component Value Date   CHOL 186 06/11/2015   Lab Results  Component Value Date   HDL 46.00 06/11/2015   Lab Results  Component Value Date   LDLCALC 117 (H) 06/11/2015   Lab Results  Component Value Date   TRIG 117.0 06/11/2015   Lab Results  Component Value Date   CHOLHDL 4 06/11/2015   No results found for: HGBA1C     Assessment & Plan:   Problem List Items Addressed This Visit    HTN (hypertension)    Well controlled, no changes to meds. Encouraged heart healthy diet such as the DASH diet and exercise as tolerated.       Arthritis    Flared in neck but is following with chiropractor Dr Karena Addison and that is helping. She is sleeping well.       Trapezius muscle spasm   Hyperlipidemia    Encouraged heart healthy diet, increase exercise, avoid trans fats, consider a krill oil cap daily  OA (osteoarthritis) of hip    Has recovered well from hip surgery performed on 8/23        Depression with anxiety    Struggling with her husband's death on Sep 11, 2016. Is tolerating Sertraline and thinks it helps. Continue same      Bladder cancer [facility])    Biopsy proven awaiting follow up with urology. Has not had any significant bleeding since her biopsy         I have discontinued Ms. Och's HYDROcodone-acetaminophen. I am also having her maintain her carbidopa-levodopa, EPINEPHrine, medroxyPROGESTERone, estradiol, hydrochlorothiazide, acetaminophen, sertraline, Polyethyl Glycol-Propyl Glycol, diclofenac, diazepam, and senna-docusate.  No orders of the defined types were placed in this encounter.    Penni Homans, MD

## 2016-10-17 NOTE — Assessment & Plan Note (Signed)
Encouraged heart healthy diet, increase exercise, avoid trans fats, consider a krill oil cap daily 

## 2016-10-17 NOTE — Assessment & Plan Note (Signed)
Well controlled, no changes to meds. Encouraged heart healthy diet such as the DASH diet and exercise as tolerated.  °

## 2016-10-17 NOTE — Assessment & Plan Note (Signed)
Struggling with her husband's death on 09/04/16. Is tolerating Sertraline and thinks it helps. Continue same

## 2016-10-17 NOTE — Assessment & Plan Note (Signed)
Has recovered well from hip surgery performed on 8/23

## 2016-10-18 ENCOUNTER — Telehealth: Payer: Self-pay | Admitting: Family Medicine

## 2016-10-18 DIAGNOSIS — M542 Cervicalgia: Secondary | ICD-10-CM | POA: Diagnosis not present

## 2016-10-18 DIAGNOSIS — M545 Low back pain: Secondary | ICD-10-CM | POA: Diagnosis not present

## 2016-10-18 DIAGNOSIS — M9903 Segmental and somatic dysfunction of lumbar region: Secondary | ICD-10-CM | POA: Diagnosis not present

## 2016-10-18 DIAGNOSIS — M9901 Segmental and somatic dysfunction of cervical region: Secondary | ICD-10-CM | POA: Diagnosis not present

## 2016-10-18 NOTE — Telephone Encounter (Signed)
OK to proceed but she needs a thoracic spine xray here or her insurance will not pay for MRI. Make sure her pain is primarily in this region still.

## 2016-10-18 NOTE — Telephone Encounter (Signed)
Once I get someone's xrays I can order MRI

## 2016-10-18 NOTE — Telephone Encounter (Signed)
Caller name: Relationship to patient: Self Can be reached: [phone] Pharmacy:  Reason for call: States that her Chiropractor suggest that she has an MRI on her neck because chiropractor can not help patient. Needs order for MRI

## 2016-10-18 NOTE — Telephone Encounter (Signed)
Same area, down the front, in front of the ear and in the back of her neck into skull and right ear. She had an xray at [facility] and is going to have them fax it over. Also Dr. Nelva Bush did xrays and she will have them fax over as well.

## 2016-10-20 DIAGNOSIS — M9903 Segmental and somatic dysfunction of lumbar region: Secondary | ICD-10-CM | POA: Diagnosis not present

## 2016-10-20 DIAGNOSIS — M545 Low back pain: Secondary | ICD-10-CM | POA: Diagnosis not present

## 2016-10-20 DIAGNOSIS — M542 Cervicalgia: Secondary | ICD-10-CM | POA: Diagnosis not present

## 2016-10-20 DIAGNOSIS — M9901 Segmental and somatic dysfunction of cervical region: Secondary | ICD-10-CM | POA: Diagnosis not present

## 2016-10-22 ENCOUNTER — Telehealth: Payer: Self-pay | Admitting: Family Medicine

## 2016-10-22 NOTE — Telephone Encounter (Signed)
Spoke with Stephanie [name] to schedule annual wellness appointment. Pt asked if x-ray report from Dr. Redmond Pulling at [facility] has been received. Pt stated that Dr. Charlett Blake wanted to view this information before she receives an MRI of her neck. Pt would like a call back.

## 2016-10-25 ENCOUNTER — Other Ambulatory Visit: Payer: Self-pay | Admitting: Family Medicine

## 2016-10-25 DIAGNOSIS — M545 Low back pain: Secondary | ICD-10-CM | POA: Diagnosis not present

## 2016-10-25 DIAGNOSIS — M542 Cervicalgia: Secondary | ICD-10-CM | POA: Diagnosis not present

## 2016-10-25 DIAGNOSIS — M9903 Segmental and somatic dysfunction of lumbar region: Secondary | ICD-10-CM | POA: Diagnosis not present

## 2016-10-25 DIAGNOSIS — M9901 Segmental and somatic dysfunction of cervical region: Secondary | ICD-10-CM | POA: Diagnosis not present

## 2016-10-25 NOTE — Telephone Encounter (Signed)
I ordered MRI but her xray dated back to May so I am not sure they will approve it.

## 2016-10-25 NOTE — Telephone Encounter (Signed)
Patient informed. 

## 2016-10-28 DIAGNOSIS — Z23 Encounter for immunization: Secondary | ICD-10-CM | POA: Diagnosis not present

## 2016-10-28 DIAGNOSIS — L821 Other seborrheic keratosis: Secondary | ICD-10-CM | POA: Diagnosis not present

## 2016-10-28 DIAGNOSIS — D2272 Melanocytic nevi of left lower limb, including hip: Secondary | ICD-10-CM | POA: Diagnosis not present

## 2016-10-28 DIAGNOSIS — D2371 Other benign neoplasm of skin of right lower limb, including hip: Secondary | ICD-10-CM | POA: Diagnosis not present

## 2016-10-28 NOTE — Progress Notes (Signed)
Pre visit review using our clinic review tool, if applicable. No additional management support is needed unless otherwise documented below in the visit note. 

## 2016-10-28 NOTE — Progress Notes (Addendum)
Subjective:   Stephanie [name] is a 82 y.o. female who presents for Medicare Annual (Subsequent) preventive examination.  Review of Systems:  No ROS.  Medicare Wellness Visit.  Cardiac Risk Factors include: advanced age (>21men, >49 women);hypertension  Sleep patterns: no sleep issues. Takes valium at bedtime. Sleeps 7 hrs nightly. Gets up twice nightly to void.   Home Safety/Smoke Alarms: Feels safe in home. Smoke alarms in place. Security system in home.   Living environment; residence: Lives alone in [place] house/ trailer, number of outside stairs: 4. Grab bars in shower. Seat Belt Safety/Bike Helmet: Wears seat belt.   Counseling:   Eye Exam- Dr. Tenna Delaine at [facility]. Upcoming appt w/ Dr. Katy Fitch.  Dental- Dr. Sung Amabile every 6 months. 2 dental implants.  Female:   Pap- N/A due to age      9- last 03/08/16, BI-RADS Category 2: Benign      Dexa scan- last 03/08/16, normal       CCS- last in 2004, normal. No further routine screening given pt's age.     Objective:     Vitals: BP (!) 142/80   Pulse 77   Resp 16   Ht 5\' 4"  (1.626 m)   Wt 123 lb 12.8 oz (56.2 kg)   LMP 01/13/2013 Comment: spotting-had benign endometrial biopsy   SpO2 98%   BMI 21.25 kg/m   Body mass index is 21.25 kg/m.   Tobacco History  Smoking Status  . Former Smoker  . Years: 10.00  . Quit date: 05/04/1964  Smokeless Tobacco  . Never Used     Counseling given: Not Answered   Past Medical History:  Diagnosis Date  . Benign hypertension without congestive heart failure   . Benign mole    right hcets mole, bleeds when dried with towel  . Bilateral dry eyes   . Bladder cancer ([place])   . Bladder cancer ([place]) 10/17/2016  . Cervical spondylosis   . Cervicalgia   . Depression with anxiety 10/17/2016  . Gross hematuria   . History of kidney stones    1970's  . Lumbar stenosis   . OA (osteoarthritis)   . Parkinson disease [facility]) dx 2012   neurologist-  dr siddiqui at [facility]   Past  Surgical History:  Procedure Laterality Date  . BIOPSY MASS LEFT FIRST METACARPAL   06/23/2006   benign  . CATARACT EXTRACTION W/ INTRAOCULAR LENS  IMPLANT, BILATERAL  1999 and 2003  . CYSTOSCOPY W/ RETROGRADES Bilateral 09/22/2016   Procedure: CYSTOSCOPY WITH RETROGRADE PYELOGRAM;  Surgeon: Alexis Frock, MD;  Location: [facility];  Service: Urology;  Laterality: Bilateral;  . D & C HYSTEROSCOPY W/ RESECTION POLYP  07/11/2000  . DILATION AND CURETTAGE OF UTERUS  1960's  . EXCISION CYST AND DEBRIDEMENT RIGHT WIRST AND REMOVAL [place] BODY  01/09/2009  . LAPAROSCOPY  yrs ago   infertility   . METACARPOPHALANGEAL JOINT ARTHRODESIS Right 05/25/1996  . TONSILLECTOMY AND ADENOIDECTOMY  child  . TOTAL HIP ARTHROPLASTY Left 07/07/2016   Procedure: LEFT TOTAL HIP ARTHROPLASTY ANTERIOR APPROACH;  Surgeon: Gaynelle Arabian, MD;  [facility];  Service: Orthopedics;  Laterality: Left;  . TRANSURETHRAL RESECTION OF BLADDER TUMOR N/A 09/22/2016   Procedure: TRANSURETHRAL RESECTION OF BLADDER TUMOR (TURBT);  Surgeon: Alexis Frock, MD;  Location: [facility];  Service: Urology;  Laterality: N/A;   Family History  Problem Relation Age of Onset  . Arthritis Mother   . Hypertension Mother   . Deep vein  thrombosis Mother     recurrent, secondary to Bleeding disorder  . Arthritis Father   . Stroke Father   . Diabetes Sister   . Heart disease Sister   . Obesity Sister   . Arthritis Sister   . Hypertension Sister   . Heart disease Maternal Grandmother   . Heart disease Maternal Grandfather   . Peripheral vascular disease Paternal Grandmother     s/p leg amputation  . Stroke Paternal Grandfather    History  Sexual Activity  . Sexual activity: No    Outpatient Encounter Prescriptions as of 10/29/2016  Medication Sig  . acetaminophen (TYLENOL) 500 MG tablet Take 1,000 mg by mouth every 6 (six) hours as needed for moderate pain.  Marland Kitchen CALCIUM-MAGNESIUM-ZINC PO Take  by mouth daily.  . carbidopa-levodopa (SINEMET IR) 25-100 MG per tablet Take 1 tablet by mouth 3 (three) times daily.   . Cholecalciferol (VITAMIN D) 2000 units CAPS Take 1 capsule by mouth daily.  . Coenzyme Q10 (COQ-10 PO) Take 300 mg by mouth daily.  . diazepam (VALIUM) 5 MG tablet Take 1 tablet (5 mg total) by mouth 2 (two) times daily as needed for anxiety.  . diclofenac (VOLTAREN) 50 MG EC tablet Take 50 mg by mouth 2 (two) times daily.  Marland Kitchen EPINEPHrine 0.3 mg/0.3 mL IJ SOAJ injection Inject 0.3 mLs (0.3 mg total) into the muscle once. (Patient taking differently: Inject 0.3 mg into the muscle once as needed (allergic reaction). )  . estradiol (ESTRACE) 0.5 MG tablet TAKE 1/2 TABLET BY MOUTH EVERY DAY (Patient taking differently: TAKE 1/2 TABLET BY MOUTH EVERY DAY--  takes in am)  . hydrochlorothiazide (HYDRODIURIL) 25 MG tablet Take 1.5 tablets (37.5 mg total) by mouth daily. (Patient taking differently: Take 37.5 mg by mouth every morning. Takes one and half tablets = 37.5mg )  . loratadine (CLARITIN) 10 MG tablet Take 10 mg by mouth daily.  . medroxyPROGESTERone (PROVERA) 2.5 MG tablet TAKE 1/2 TABLET(1.25 MG) BY MOUTH DAILY (Patient taking differently: TAKE 1/2 TABLET(1.25 MG) BY MOUTH DAILY--- takes in am)  . Multiple Vitamins-Minerals (CENTRUM SILVER PO) Take 1 tablet by mouth daily.  Vladimir Faster Glycol-Propyl Glycol (SYSTANE) 0.4-0.3 % SOLN Apply 1 drop to eye every morning.  . Probiotic Product (PROBIOTIC DAILY PO) Take 1 capsule by mouth daily.  . sertraline (ZOLOFT) 25 MG tablet TAKE 1 TABLET BY MOUTH EVERY DAY (Patient taking differently: TAKE 1 TABLET BY MOUTH EVERY DAY--  takes in am)  . [DISCONTINUED] senna-docusate (SENOKOT-S) 8.6-50 MG tablet Take 1 tablet by mouth 2 (two) times daily. While taking pain meds to prevent constipation. (Patient not taking: Reported on 10/29/2016)   No facility-administered encounter medications on file as of 10/29/2016.     Activities of Daily  Living In your present state of health, do you have any difficulty performing the following activities: 10/29/2016 09/22/2016  Hearing? Tempie Donning  Vision? N N  Difficulty concentrating or making decisions? N N  Walking or climbing stairs? N N  Dressing or bathing? N Y  Doing errands, shopping? N -  Preparing Food and eating ? N -  Using the Toilet? N -  In the past six months, have you accidently leaked urine? N -  Do you have problems with loss of bowel control? N -  Managing your Medications? N -  Managing your Finances? N -  Housekeeping or managing your Housekeeping? N -  Some recent data might be hidden    Patient Care Team: Bonnita Levan  Charlett Blake, MD as PCP - General (Family Medicine) Megan Salon, MD as Consulting Physician (Gynecology) Eldridge Abrahams, MD as Referring Physician (Neurology) Alexis Frock, MD as Consulting Physician (Urology) Debbra Riding, MD as Consulting Physician (Ophthalmology) Corene Cornea, DC (Chiropractic Medicine) Jari Pigg, MD as Consulting Physician (Dermatology)    Assessment:    Physical assessment deferred to PCP.  Exercise Activities and Dietary recommendations Current Exercise Habits: Home exercise routine, Type of exercise: walking, Frequency (Times/Week): 7, Intensity: Moderate  Diet (meal preparation, eat out, water intake, caffeinated beverages, dairy products, fruits and vegetables): in general, a "healthy" diet  , well balanced, on average, 3 meals per day. Prepares most meals herself. Still eating frozen leftovers from husband's funeral. Drinks water throughout the day. Drinking herbal tea and green tea. Breakfast: oatmeal, cereal, banana, OJ, and coffee      Goals    . Decrease neck pain    . Increase physical activity      Fall Risk Fall Risk  10/29/2016 07/14/2016 06/29/2016 06/11/2015 02/25/2014  Falls in the past year? No No No No No   Depression Screen PHQ 2/9 Scores 10/29/2016 06/29/2016 06/11/2015 02/25/2014  PHQ - 2 Score  1 6 0 0  PHQ- 9 Score - 15 - -     Cognitive Function MMSE - Mini Mental State Exam 10/29/2016  Orientation to time 5  Orientation to Place 5  Registration 3  Attention/ Calculation 5  Recall 3  Language- name 2 objects 2  Language- repeat 1  Language- follow 3 step command 3  Language- read & follow direction 1  Write a sentence 1  Copy design 0  Total score 29        Immunization History  Administered Date(s) Administered  . Influenza Whole 08/15/2012  . Influenza, High Dose Seasonal PF 08/19/2015  . Influenza-Unspecified 08/15/2014, 08/24/2016  . Pneumococcal Conjugate-13 12/11/2014  . Pneumococcal Polysaccharide-23 11/24/2011  . Td 02/25/2014  . Zoster 08/27/2011   Screening Tests Health Maintenance  Topic Date Due  . MAMMOGRAM  03/08/2017  . TETANUS/TDAP  02/26/2024  . INFLUENZA VACCINE  Addressed  . DEXA SCAN  Completed  . ZOSTAVAX  Completed  . PNA vac Low Risk Adult  Completed      Plan:    Follow-up w/ Dr. Charlett Blake as scheduled. Pt plans to go to [facility] for hearing aids.  During the course of the visit the patient was educated and counseled about the following appropriate screening and preventive services:   Vaccines to include Pneumoccal, Influenza, Hepatitis B, Td, Zostavax, HCV  Cardiovascular Disease  Colorectal cancer screening  Bone density screening  Diabetes screening  Glaucoma screening  Mammography/PAP  Nutrition counseling   Patient Instructions (the written plan) was given to the patient.   Dorrene German, RN  10/29/2016  RN AWV note reviewed. Agree with documention and plan.

## 2016-10-29 ENCOUNTER — Encounter: Payer: Self-pay | Admitting: *Deleted

## 2016-10-29 ENCOUNTER — Ambulatory Visit (INDEPENDENT_AMBULATORY_CARE_PROVIDER_SITE_OTHER): Payer: Medicare Other | Admitting: *Deleted

## 2016-10-29 VITALS — BP 142/80 | HR 77 | Resp 16 | Ht 64.0 in | Wt 123.8 lb

## 2016-10-29 DIAGNOSIS — C679 Malignant neoplasm of bladder, unspecified: Secondary | ICD-10-CM

## 2016-10-29 DIAGNOSIS — Z0000 Encounter for general adult medical examination without abnormal findings: Secondary | ICD-10-CM | POA: Diagnosis not present

## 2016-10-29 DIAGNOSIS — F418 Other specified anxiety disorders: Secondary | ICD-10-CM

## 2016-10-29 DIAGNOSIS — I10 Essential (primary) hypertension: Secondary | ICD-10-CM | POA: Diagnosis not present

## 2016-10-29 NOTE — Assessment & Plan Note (Signed)
Following w/ Dr. Tresa Moore [facility]).

## 2016-10-29 NOTE — Assessment & Plan Note (Signed)
Pt mourning recent loss of her husband of 78 years in October. PHQ-9=1 today. Pt is appropriately tearful when discussing husband's death, states she has good days and bad days. Expresses gratitude that he is no longer suffering. Pt to continue current medications and follow-up w/ PCP as scheduled.

## 2016-10-29 NOTE — Assessment & Plan Note (Signed)
BP Readings from Last 3 Encounters:  10/29/16 (!) 142/80  10/01/16 128/72  09/22/16 (!) 160/70   BP slightly elevated today. Pt asymptomatic and reports compliance w/ medication. Feel this is probably acceptable BP given her age-defer to PCP for ongoing management.

## 2016-10-29 NOTE — Patient Instructions (Signed)
  Ms. Stephanie [name] , Thank you for taking time to come for your Medicare Wellness Visit. I appreciate your ongoing commitment to your health goals. Please review the following plan we discussed and let me know if I can assist you in the future.   Bring a copy of your advance directives to your next office visit. Follow-up w/ Dr. Charlett Blake as scheduled.  We will call you next week with your MRI results.   These are the goals we discussed: Goals    . Decrease neck pain    . Increase physical activity       This is a list of the screening recommended for you and due dates:  Health Maintenance  Topic Date Due  . Mammogram  03/08/2017  . Tetanus Vaccine  02/26/2024  . Flu Shot  Addressed  . DEXA scan (bone density measurement)  Completed  . Shingles Vaccine  Completed  . Pneumonia vaccines  Completed

## 2016-10-30 ENCOUNTER — Ambulatory Visit (HOSPITAL_BASED_OUTPATIENT_CLINIC_OR_DEPARTMENT_OTHER)
Admission: RE | Admit: 2016-10-30 | Discharge: 2016-10-30 | Disposition: A | Discharge status: Home / Self Care | Payer: Medicare Other | Source: Ambulatory Visit | Attending: Family Medicine | Admitting: Family Medicine

## 2016-10-30 DIAGNOSIS — M4682 Other specified inflammatory spondylopathies, cervical region: Secondary | ICD-10-CM | POA: Diagnosis not present

## 2016-10-30 DIAGNOSIS — M47812 Spondylosis without myelopathy or radiculopathy, cervical region: Secondary | ICD-10-CM | POA: Insufficient documentation

## 2016-10-30 DIAGNOSIS — M542 Cervicalgia: Secondary | ICD-10-CM | POA: Diagnosis not present

## 2016-11-01 ENCOUNTER — Other Ambulatory Visit: Payer: Self-pay | Admitting: Family Medicine

## 2016-11-01 ENCOUNTER — Telehealth: Payer: Self-pay | Admitting: Family Medicine

## 2016-11-01 DIAGNOSIS — M545 Low back pain: Secondary | ICD-10-CM | POA: Diagnosis not present

## 2016-11-01 DIAGNOSIS — M542 Cervicalgia: Secondary | ICD-10-CM

## 2016-11-01 DIAGNOSIS — M9903 Segmental and somatic dysfunction of lumbar region: Secondary | ICD-10-CM | POA: Diagnosis not present

## 2016-11-01 DIAGNOSIS — M9901 Segmental and somatic dysfunction of cervical region: Secondary | ICD-10-CM | POA: Diagnosis not present

## 2016-11-01 NOTE — Telephone Encounter (Signed)
Called informed the patient of PCP instructions. She verbally understood/agreed to all.

## 2016-11-01 NOTE — Telephone Encounter (Signed)
Patient has had sore throast/drainage for 3 to 4 days-- no fever. Advise please.

## 2016-11-01 NOTE — Telephone Encounter (Signed)
Likely viral. Encouraged increased rest and hydration, add probiotics such as NOW or [employer] zinc such as Coldeze or Xicam. Treat fevers as needed. Elderberry, vitamin C 500 to 1000 mg and aged or black garlic daily and come in if persists or worsens

## 2016-11-11 ENCOUNTER — Ambulatory Visit (INDEPENDENT_AMBULATORY_CARE_PROVIDER_SITE_OTHER): Payer: Medicare Other | Admitting: Family Medicine

## 2016-11-11 ENCOUNTER — Encounter: Payer: Self-pay | Admitting: Family Medicine

## 2016-11-11 VITALS — BP 122/58 | HR 86 | Temp 97.6°F | Ht 64.0 in | Wt 126.4 lb

## 2016-11-11 DIAGNOSIS — J020 Streptococcal pharyngitis: Secondary | ICD-10-CM | POA: Diagnosis not present

## 2016-11-11 NOTE — Patient Instructions (Addendum)
Use Debrox to loosen up the wax. After using this, you can take a blue baby syringe and flush warm water in your left ear.   Continue using Tylenol and a rare dose of Motrin for pain. Push fluids. Consider throat lozenges and salt water gargles.   We will call you if your culture comes back positive.  Let us know if anything changes- worsening pain, fevers, new symptoms.

## 2016-11-11 NOTE — Progress Notes (Signed)
SUBJECTIVE:   Stephanie [name] is a 82 y.o. female presents to the clinic for:  Chief Complaint  Patient presents with  . Sore Throat    Pt reports ear pain and throat pain located on the RT side     Complains of sore throat for 6 days.  Other associated symptoms: sinus congestion.  Denies: subjective fever, ear drainage, shortness of breath and cough Sick Contacts: none known Therapy to date: Tylenol  History  Smoking Status  . Former Smoker  . Years: 10.00  . Quit date: 05/04/1964  Smokeless Tobacco  . Never Used    ROS: Pertinent items are noted in HPI  Patient's medications, allergies, past medical, surgical, social and family histories were reviewed and updated as appropriate.  OBJECTIVE:  BP (!) 122/58 (BP Location: Right Arm, Patient Position: Sitting, Cuff Size: Small)   Pulse 86   Temp 97.6 F (36.4 C) (Oral)   Ht 5\' 4"  (1.626 m)   Wt 126 lb 6.4 oz (57.3 kg)   LMP 01/13/2013 Comment: spotting-had benign endometrial biopsy   SpO2 98%   BMI 21.70 kg/m  General: Awake, alert, appearing stated age Eyes: conjunctivae and sclerae clear Ears: normal TM on R, 100% obstructed on L.  Nose: no visible exudate, no sinus tenderness Oropharynx: normal findings: lips normal without lesions and there is a small bit of erythema over the tonsillar pillars b/l, no exudate or asymmetry Neck: supple, no significant adenopathy Lungs: clear to auscultation, no wheezes, rales or rhonchi, symmetric air entry, normal effort Heart: rate and rhythm regular Skin: reveals no rash Psych: Age appropriate judgment and insight  ASSESSMENT/PLAN:  Streptococcal sore throat - Plan: Culture, Group A Strep  Orders as above. 1/4 Centor (lack of cough), will culture as we are out of Rapid strep tests for now. My suspicion is low for this though. Briefly brought up cerumen issues and gave recs for using Debrox and gentle flushing in AVS. F/u prn. Pt voiced understanding and agreement to the  plan.  [place], DO 11/11/16 9:47 AM

## 2016-11-11 NOTE — Progress Notes (Signed)
Pre visit review using our clinic review tool, if applicable. No additional management support is needed unless otherwise documented below in the visit note. 

## 2016-11-12 LAB — CULTURE, GROUP A STREP: ORGANISM ID, BACTERIA: NORMAL

## 2016-11-23 DIAGNOSIS — C641 Malignant neoplasm of right kidney, except renal pelvis: Secondary | ICD-10-CM | POA: Diagnosis not present

## 2016-11-23 DIAGNOSIS — Z5111 Encounter for antineoplastic chemotherapy: Secondary | ICD-10-CM | POA: Diagnosis not present

## 2016-11-23 DIAGNOSIS — R310 Gross hematuria: Secondary | ICD-10-CM | POA: Diagnosis not present

## 2016-11-23 DIAGNOSIS — C672 Malignant neoplasm of lateral wall of bladder: Secondary | ICD-10-CM | POA: Diagnosis not present

## 2016-11-30 DIAGNOSIS — C672 Malignant neoplasm of lateral wall of bladder: Secondary | ICD-10-CM | POA: Diagnosis not present

## 2016-11-30 DIAGNOSIS — Z5111 Encounter for antineoplastic chemotherapy: Secondary | ICD-10-CM | POA: Diagnosis not present

## 2016-12-03 ENCOUNTER — Telehealth: Payer: Self-pay | Admitting: Family Medicine

## 2016-12-03 ENCOUNTER — Ambulatory Visit (INDEPENDENT_AMBULATORY_CARE_PROVIDER_SITE_OTHER): Payer: Medicare Other | Admitting: Medical

## 2016-12-03 ENCOUNTER — Encounter: Payer: Self-pay | Admitting: Medical

## 2016-12-03 VITALS — BP 105/66 | HR 84 | Temp 97.9°F | Resp 16 | Ht 64.0 in | Wt 125.1 lb

## 2016-12-03 DIAGNOSIS — R062 Wheezing: Secondary | ICD-10-CM | POA: Diagnosis not present

## 2016-12-03 DIAGNOSIS — R05 Cough: Secondary | ICD-10-CM | POA: Diagnosis not present

## 2016-12-03 DIAGNOSIS — R059 Cough, unspecified: Secondary | ICD-10-CM

## 2016-12-03 DIAGNOSIS — J111 Influenza due to unidentified influenza virus with other respiratory manifestations: Secondary | ICD-10-CM | POA: Diagnosis not present

## 2016-12-03 DIAGNOSIS — R6889 Other general symptoms and signs: Secondary | ICD-10-CM | POA: Diagnosis not present

## 2016-12-03 LAB — POCT INFLUENZA A: RAPID INFLUENZA A AGN: POSITIVE

## 2016-12-03 LAB — POCT RAPID STREP A (OFFICE): Rapid Strep A Screen: NEGATIVE

## 2016-12-03 MED ORDER — OSELTAMIVIR PHOSPHATE 75 MG PO CAPS: 75.0000 mg | ORAL_CAPSULE | Freq: Two times a day (BID) | ORAL | 0 refills | Status: DC

## 2016-12-03 MED ORDER — BENZONATATE 100 MG PO CAPS: 100.0000 mg | ORAL_CAPSULE | Freq: Three times a day (TID) | ORAL | 0 refills | Status: DC | PRN

## 2016-12-03 MED ORDER — AZITHROMYCIN 250 MG PO TABS: ORAL_TABLET | ORAL | 0 refills | Status: DC

## 2016-12-03 MED ORDER — ALBUTEROL SULFATE HFA 108 (90 BASE) MCG/ACT IN AERS: 2.0000 | INHALATION_SPRAY | Freq: Four times a day (QID) | RESPIRATORY_TRACT | 0 refills | Status: DC | PRN

## 2016-12-03 NOTE — Progress Notes (Signed)
Subjective:    Patient ID: Stephanie [name], female    DOB: [birth year], 83 y.o.   MRN: [id number]  HPI  Pt in with some st, cough, nasal and nasal congestion for 3 days. Some chest congestion and low grade fever Today she states feels terrible. Low energy and her back is achy(started today). Pt had flu vaccine this year in October.   Pt had 2 bladder treatments for cancer. One past Tuesday and then another one this Tuesday.(Pt told may have some viral syndrome type symptoms related to treatment).     Review of Systems  Constitutional: Negative for chills and fever.  HENT: Positive for congestion. Negative for postnasal drip, sinus pain, sinus pressure, sneezing and tinnitus.   Respiratory: Positive for cough and wheezing. Negative for chest tightness and shortness of breath.        Wheezing at night some/minimal.  Cardiovascular: Negative for chest pain and palpitations.  Gastrointestinal: Negative for abdominal pain, diarrhea, nausea and vomiting.  Musculoskeletal: Positive for back pain. Negative for arthralgias, gait problem and joint swelling.  Skin: Negative for rash.  Neurological: Negative for dizziness, syncope, weakness and light-headedness.  Hematological: Negative for adenopathy. Does not bruise/bleed easily.  Psychiatric/Behavioral: Negative for behavioral problems and confusion.    Past Medical History:  Diagnosis Date  . Benign hypertension without congestive heart failure   . Benign mole    right hcets mole, bleeds when dried with towel  . Bilateral dry eyes   . Bladder cancer ([place])   . Bladder cancer ([place]) 10/17/2016  . Cervical spondylosis   . Cervicalgia   . Depression with anxiety 10/17/2016  . Gross hematuria   . History of kidney stones    1970's  . Lumbar stenosis   . OA (osteoarthritis)   . Parkinson disease [facility]) dx 2012   neurologist-  dr siddiqui at [facility]  . Marital status: Widowed    Spouse name:  N/A  . Number of children: N/A  . Years of education: N/A   Occupational History  . Not on file.   Social History Main Topics  . Smoking status: Former Smoker    Years: 10.00    Quit date: 05/04/1964  . Smokeless tobacco: Never Used  . Alcohol use 2.0 oz/week    4 Standard drinks or equivalent per week     Comment: social  . Drug use: No  . Sexual activity: No   Other Topics Concern  . Not on file   Social History Narrative  . No narrative on file    Past Surgical History:  Procedure Laterality Date  . BIOPSY MASS LEFT FIRST METACARPAL   06/23/2006   benign  . CATARACT EXTRACTION W/ INTRAOCULAR LENS  IMPLANT, BILATERAL  1999 and 2003  . CYSTOSCOPY W/ RETROGRADES Bilateral 09/22/2016   Procedure: CYSTOSCOPY WITH RETROGRADE PYELOGRAM;  Surgeon: Alexis Frock, MD;  Location: [facility];  Service: Urology;  Laterality: Bilateral;  . D & C HYSTEROSCOPY W/ RESECTION POLYP  07/11/2000  . DILATION AND CURETTAGE OF UTERUS  1960's  . EXCISION CYST AND DEBRIDEMENT RIGHT WIRST AND REMOVAL [place] BODY  01/09/2009  . LAPAROSCOPY  yrs ago   infertility   . METACARPOPHALANGEAL JOINT ARTHRODESIS Right 05/25/1996  . TONSILLECTOMY AND ADENOIDECTOMY  child  . TOTAL HIP ARTHROPLASTY Left 07/07/2016   Procedure: LEFT TOTAL HIP ARTHROPLASTY ANTERIOR APPROACH;  Surgeon: Gaynelle Arabian, MD;  Location: [facility];  Service:  Orthopedics;  Laterality: Left;  . TRANSURETHRAL RESECTION OF BLADDER TUMOR N/A 09/22/2016   Procedure: TRANSURETHRAL RESECTION OF BLADDER TUMOR (TURBT);  Surgeon: Alexis Frock, MD;  [facility];  Service: Urology;  Laterality: N/A;    Family History  Problem Relation Age of Onset  . Arthritis Mother   . Hypertension Mother   . Deep vein thrombosis Mother     recurrent, secondary to Bleeding disorder  . Arthritis Father   . Stroke Father   . Diabetes Sister   . Heart disease Sister   . Obesity Sister   . Arthritis Sister   .  Hypertension Sister   . Heart disease Maternal Grandmother   . Heart disease Maternal Grandfather   . Peripheral vascular disease Paternal Grandmother     s/p leg amputation  . Stroke Paternal Grandfather     Allergies  Allergen Reactions  . Bee Venom Swelling    Yellow jackets, white and black face hornets, local swelling only  . Codeine Nausea And Vomiting  . Penicillins Itching and Swelling  . Lodine [Etodolac] Rash  . Tramadol Itching and Rash    Current Outpatient Prescriptions on File Prior to Visit  Medication Sig Dispense Refill  . acetaminophen (TYLENOL) 500 MG tablet Take 1,000 mg by mouth every 6 (six) hours as needed for moderate pain.    Marland Kitchen CALCIUM-MAGNESIUM-ZINC PO Take by mouth daily.    . carbidopa-levodopa (SINEMET IR) 25-100 MG per tablet Take 1 tablet by mouth 3 (three) times daily.     . Cholecalciferol (VITAMIN D) 2000 units CAPS Take 1 capsule by mouth daily.    . Coenzyme Q10 (COQ-10 PO) Take 300 mg by mouth daily.    . diazepam (VALIUM) 5 MG tablet Take 1 tablet (5 mg total) by mouth 2 (two) times daily as needed for anxiety. 60 tablet 1  . diclofenac (VOLTAREN) 50 MG EC tablet Take 50 mg by mouth 2 (two) times daily.    Marland Kitchen EPINEPHrine 0.3 mg/0.3 mL IJ SOAJ injection Inject 0.3 mLs (0.3 mg total) into the muscle once. (Patient taking differently: Inject 0.3 mg into the muscle once as needed (allergic reaction). ) 2 Device 2  . estradiol (ESTRACE) 0.5 MG tablet TAKE 1/2 TABLET BY MOUTH EVERY DAY (Patient taking differently: TAKE 1/2 TABLET BY MOUTH EVERY DAY--  takes in am) 45 tablet 3  . hydrochlorothiazide (HYDRODIURIL) 25 MG tablet Take 1.5 tablets (37.5 mg total) by mouth daily. (Patient taking differently: Take 37.5 mg by mouth every morning. Takes one and half tablets = 37.5mg ) 135 tablet 0  . loratadine (CLARITIN) 10 MG tablet Take 10 mg by mouth daily.    . medroxyPROGESTERone (PROVERA) 2.5 MG tablet TAKE 1/2 TABLET(1.25 MG) BY MOUTH DAILY (Patient taking  differently: TAKE 1/2 TABLET(1.25 MG) BY MOUTH DAILY--- takes in am) 45 tablet 3  . Multiple Vitamins-Minerals (CENTRUM SILVER PO) Take 1 tablet by mouth daily.    Vladimir Faster Glycol-Propyl Glycol (SYSTANE) 0.4-0.3 % SOLN Apply 1 drop to eye every morning.    . Probiotic Product (PROBIOTIC DAILY PO) Take 1 capsule by mouth daily.    . sertraline (ZOLOFT) 25 MG tablet TAKE 1 TABLET BY MOUTH EVERY DAY (Patient taking differently: TAKE 1 TABLET BY MOUTH EVERY DAY--  takes in am) 90 tablet 0   No current facility-administered medications on file prior to visit.     BP 105/66 (BP Location: Left Arm, Patient Position: Sitting, Cuff Size: Normal)   Pulse 84  Temp 97.9 F (36.6 C) (Oral)   Resp 16   Ht 5\' 4"  (1.626 m)   Wt 125 lb 2 oz (56.8 kg)   LMP 01/13/2013 Comment: spotting-had benign endometrial biopsy   SpO2 98%   BMI 21.48 kg/m       Objective:   Physical Exam  General  Mental Status - Alert. General Appearance - Well groomed. Not in acute distress.  Skin Rashes- No Rashes.  HEENT Head- Normal. Ear Auditory Canal - Left- Normal. Right - Normal.Tympanic Membrane- Left- Normal. Right- Normal. Eye Sclera/Conjunctiva- Left- Normal. Right- Normal. Nose & Sinuses Nasal Mucosa- Left-  Boggy and Congested. Right-  Boggy and  Congested.Bilateral maxillary and frontal sinus pressure. Mouth & Throat Lips: Upper Lip- Normal: no dryness, cracking, pallor, cyanosis, or vesicular eruption. Lower Lip-Normal: no dryness, cracking, pallor, cyanosis or vesicular eruption. Buccal Mucosa- Bilateral- No Aphthous ulcers. Oropharynx- No Discharge or Erythema. Tonsils: Characteristics- Bilateral- No Erythema or Congestion. Size/Enlargement- Bilateral- No enlargement. Discharge- bilateral-None.  Neck Neck- Supple. No Masses.   Chest and Lung Exam Auscultation: Breath Sounds:-Clear even and unlabored.  Cardiovascular Auscultation:Rythm- Regular, rate and rhythm. Murmurs & Other Heart  Sounds:Ausculatation of the heart reveal- No Murmurs.  Lymphatic Head & Neck General Head & Neck Lymphatics: Bilateral: Description- No Localized lymphadenopathy.       Assessment & Plan:  You have the flu. I am treating you with tamiflu. Rest, hydrate and take tylenol for fever. Alternate ibuprofen as discussed  if necessary for body ache or fever. You should gradually improve but  if you develop secondary bacterial infection signs and symptoms then go ahead and start azithromycin.  For cough rx benzonatate. For wheezing rx albuterol  Follow up in 7 days or as needed. But if symptoms worsen severely then ED evaluation. And if not improving adequately by Monday then return.    Brisha Mccabe, Percell Miller, PA-C

## 2016-12-03 NOTE — Addendum Note (Signed)
Addended by: Rockwell Germany on: 12/03/2016 02:36 PM   Modules accepted: Orders

## 2016-12-03 NOTE — Telephone Encounter (Signed)
Patient Name: Stephanie [name] DOB: Aug 23, 1932 Initial Comment Caller states she has a temperature of 100.5. She is a cancer patient. Also has deep chest congestion. Nurse Assessment Nurse: Ronnald Ramp, RN, Miranda Date/Time (Eastern Time): 12/03/2016 8:46:11 AM Confirm and document reason for call. If symptomatic, describe symptoms. ---Caller states she has had congestion and cough for 3 days. She states her breathing is tight and it hurts when she coughs. Also with fever, temp 100.5 (upto 101.2). Does the patient have any new or worsening symptoms? ---Yes Will a triage be completed? ---Yes Related visit to physician within the last 2 weeks? ---No Does the PT have any chronic conditions? (i.e. diabetes, asthma, etc.) ---Yes List chronic conditions. ---Bladder cancer - current treatment, HTN, Parkinson's disease Is this a behavioral health or substance abuse call? ---No Guidelines Guideline Title Affirmed Question Affirmed Notes Cough - Acute Productive [1] Fever > 100.5 F (38.1 C) AND [2] diabetes mellitus or weak immune system (e.g., HIV positive, cancer chemo, splenectomy, organ transplant, chronic steroids) Final Disposition User See Physician within 4 Hours (or PCP triage) Ronnald Ramp, RN, Miranda Comments Appt schedule for 11am with Mackie Pai Referrals REFERRED TO PCP OFFICE Disagree/Comply: Comply

## 2016-12-03 NOTE — Patient Instructions (Addendum)
You have the flu. I am treating you with tamiflu. Rest, hydrate and take tylenol for fever. Alternate ibuprofen as discussed  if necessary for body ache or fever. You should gradually improve but  if you develop secondary bacterial infection signs and symptoms then go ahead and start azithromycin.  For cough rx benzonatate. For wheezing rx albuterol  Follow up in 7 days or as needed. But if symptoms worsen severely then ED evaluation. And if not improving adequately by Monday then return.

## 2016-12-03 NOTE — Progress Notes (Signed)
Pre visit review using our clinic review tool, if applicable. No additional management support is needed unless otherwise documented below in the visit note/SLS  

## 2016-12-06 ENCOUNTER — Emergency Department (HOSPITAL_BASED_OUTPATIENT_CLINIC_OR_DEPARTMENT_OTHER): Payer: Medicare Other

## 2016-12-06 ENCOUNTER — Emergency Department (HOSPITAL_BASED_OUTPATIENT_CLINIC_OR_DEPARTMENT_OTHER)
Admission: EM | Admit: 2016-12-06 | Discharge: 2016-12-06 | Disposition: A | Discharge status: Home / Self Care | Payer: Medicare Other | Attending: Emergency Medicine | Admitting: Emergency Medicine

## 2016-12-06 ENCOUNTER — Encounter (HOSPITAL_BASED_OUTPATIENT_CLINIC_OR_DEPARTMENT_OTHER): Payer: Self-pay | Admitting: Emergency Medicine

## 2016-12-06 DIAGNOSIS — J40 Bronchitis, not specified as acute or chronic: Secondary | ICD-10-CM | POA: Diagnosis not present

## 2016-12-06 DIAGNOSIS — I119 Hypertensive heart disease without heart failure: Secondary | ICD-10-CM | POA: Diagnosis not present

## 2016-12-06 DIAGNOSIS — Z8551 Personal history of malignant neoplasm of bladder: Secondary | ICD-10-CM | POA: Insufficient documentation

## 2016-12-06 DIAGNOSIS — G20 Parkinson's disease: Secondary | ICD-10-CM | POA: Diagnosis not present

## 2016-12-06 DIAGNOSIS — Z87891 Personal history of nicotine dependence: Secondary | ICD-10-CM | POA: Insufficient documentation

## 2016-12-06 DIAGNOSIS — R05 Cough: Secondary | ICD-10-CM | POA: Diagnosis not present

## 2016-12-06 MED ORDER — ALBUTEROL SULFATE (2.5 MG/3ML) 0.083% IN NEBU
2.5000 mg | INHALATION_SOLUTION | Freq: Once | RESPIRATORY_TRACT | Status: AC
  Administered 2016-12-06: 2.5 mg via RESPIRATORY_TRACT
  Filled 2016-12-06: qty 3

## 2016-12-06 MED ORDER — IPRATROPIUM-ALBUTEROL 0.5-2.5 (3) MG/3ML IN SOLN
3.0000 mL | Freq: Once | RESPIRATORY_TRACT | Status: AC
  Administered 2016-12-06: 3 mL via RESPIRATORY_TRACT
  Filled 2016-12-06: qty 3

## 2016-12-06 NOTE — ED Provider Notes (Signed)
[place] DE[facility] Provider Note   CSN: [id number] Arrival date & time: 12/06/16  1812  By signing my name below, I, Ephriam Jenkins, attest that this documentation has been prepared under the direction and in the presence of Blanchie Dessert, MD. Electronically signed, Ephriam Jenkins, ED Scribe. 12/06/16. 7:01 PM.  History   Chief Complaint Chief Complaint  Patient presents with  . Cough  . Shortness of Breath    HPI HPI Comments: Stephanie [name] is a 82 y.o. female, with Hx of bladder cancer, who presents to the Emergency Department complaining of intermittent right sided chest pain with associated cough that started today. This is a non-productive cough and states that she felt intermittently short of breath after walking outside today. She was seen here three days ago, and had a fever for 3 nights prior to being seen. She was dx with the flu. She reports that she has been drinking fluids consistently. Pt was started on Tamiflu and a zpack. She was also prescribed an inhaler for wheezing but reports no significant relief with this medication. Pt reports pain to her left side when coughing. She has a treatment for her bladder cancer scheduled for tomorrow, last treatment was six days ago.  The history is provided by the patient. No language interpreter was used.    Past Medical History:  Diagnosis Date  . Benign hypertension without congestive heart failure   . Benign mole    right hcets mole, bleeds when dried with towel  . Bilateral dry eyes   . Bladder cancer ([place])   . Bladder cancer ([place]) 10/17/2016  . Cervical spondylosis   . Cervicalgia   . Depression with anxiety 10/17/2016  . Gross hematuria   . History of kidney stones    1970's  . Lumbar stenosis   . OA (osteoarthritis)   . Parkinson disease [facility]) dx 2012   neurologist-  dr siddiqui at [facility]    Patient Active Problem List   Diagnosis Date Noted  . Depression with anxiety 10/17/2016  . Bladder cancer ([place])  10/17/2016  . OA (osteoarthritis) of hip 07/07/2016  . Vaginitis and vulvovaginitis 01/18/2016  . Preventative health care 01/18/2016  . Low back pain 01/18/2016  . History of chicken pox   . Hyperlipidemia 12/15/2014  . Allergic rhinitis 06/25/2014  . Allergy to bee sting 06/10/2014  . TMJ tenderness 11/16/2013  . Other malaise and fatigue 05/07/2013  . Trapezius muscle spasm 11/27/2012  . Parkinson's disease ([place]) 05/24/2012  . HTN (hypertension) 08/31/2011  . Arthritis 08/31/2011  . Benign hypertensive heart disease without heart failure 05/12/2011    Past Surgical History:  Procedure Laterality Date  . BIOPSY MASS LEFT FIRST METACARPAL   06/23/2006   benign  . CATARACT EXTRACTION W/ INTRAOCULAR LENS  IMPLANT, BILATERAL  1999 and 2003  . CYSTOSCOPY W/ RETROGRADES Bilateral 09/22/2016   Procedure: CYSTOSCOPY WITH RETROGRADE PYELOGRAM;  Surgeon: Alexis Frock, MD;  Location: [facility];  Service: Urology;  Laterality: Bilateral;  . D & C HYSTEROSCOPY W/ RESECTION POLYP  07/11/2000  . DILATION AND CURETTAGE OF UTERUS  1960's  . EXCISION CYST AND DEBRIDEMENT RIGHT WIRST AND REMOVAL [place] BODY  01/09/2009  . LAPAROSCOPY  yrs ago   infertility   . METACARPOPHALANGEAL JOINT ARTHRODESIS Right 05/25/1996  . TONSILLECTOMY AND ADENOIDECTOMY  child  . TOTAL HIP ARTHROPLASTY Left 07/07/2016   Procedure: LEFT TOTAL HIP ARTHROPLASTY ANTERIOR APPROACH;  Surgeon: Gaynelle Arabian, MD;  Location: [facility];  Service:  Orthopedics;  Laterality: Left;  . TRANSURETHRAL RESECTION OF BLADDER TUMOR N/A 09/22/2016   Procedure: TRANSURETHRAL RESECTION OF BLADDER TUMOR (TURBT);  Surgeon: Alexis Frock, MD;  Location: [facility];  Service: Urology;  Laterality: N/A;    OB History    Gravida Para Term Preterm AB Living   2 1       1    SAB TAB Ectopic Multiple Live Births                   Home Medications    Prior to Admission medications   Medication Sig Start Date  End Date Taking? Authorizing Provider  acetaminophen (TYLENOL) 500 MG tablet Take 1,000 mg by mouth every 6 (six) hours as needed for moderate pain.    Historical Provider, MD  albuterol (PROVENTIL HFA;VENTOLIN HFA) 108 (90 Base) MCG/ACT inhaler Inhale 2 puffs into the lungs every 6 (six) hours as needed for wheezing or shortness of breath. 12/03/16   Percell Miller Saguier, PA-C  azithromycin (ZITHROMAX) 250 MG tablet Take 2 tablets by mouth on day 1, followed by 1 tablet by mouth daily for 4 days. 12/03/16   Percell Miller Saguier, PA-C  benzonatate (TESSALON) 100 MG capsule Take 1 capsule (100 mg total) by mouth 3 (three) times daily as needed. 12/03/16   Percell Miller Saguier, PA-C  CALCIUM-MAGNESIUM-ZINC PO Take by mouth daily.    Historical Provider, MD  carbidopa-levodopa (SINEMET IR) 25-100 MG per tablet Take 1 tablet by mouth 3 (three) times daily.  06/04/13   Historical Provider, MD  Cholecalciferol (VITAMIN D) 2000 units CAPS Take 1 capsule by mouth daily.    Historical Provider, MD  Coenzyme Q10 (COQ-10 PO) Take 300 mg by mouth daily.    Historical Provider, MD  diazepam (VALIUM) 5 MG tablet Take 1 tablet (5 mg total) by mouth 2 (two) times daily as needed for anxiety. 09/21/16   Mosie Lukes, MD  diclofenac (VOLTAREN) 50 MG EC tablet Take 50 mg by mouth 2 (two) times daily.    Historical Provider, MD  EPINEPHrine 0.3 mg/0.3 mL IJ SOAJ injection Inject 0.3 mLs (0.3 mg total) into the muscle once. Patient taking differently: Inject 0.3 mg into the muscle once as needed (allergic reaction).  10/01/14   Midge Minium, MD  estradiol (ESTRACE) 0.5 MG tablet TAKE 1/2 TABLET BY MOUTH EVERY DAY Patient taking differently: TAKE 1/2 TABLET BY MOUTH EVERY DAY--  takes in am 02/03/16   Megan Salon, MD  hydrochlorothiazide (HYDRODIURIL) 25 MG tablet Take 1.5 tablets (37.5 mg total) by mouth daily. Patient taking differently: Take 37.5 mg by mouth every morning. Takes one and half tablets = 37.5mg  06/29/16   Mosie Lukes, MD  loratadine (CLARITIN) 10 MG tablet Take 10 mg by mouth daily.    Historical Provider, MD  medroxyPROGESTERone (PROVERA) 2.5 MG tablet TAKE 1/2 TABLET(1.25 MG) BY MOUTH DAILY Patient taking differently: TAKE 1/2 TABLET(1.25 MG) BY MOUTH DAILY--- takes in am 02/03/16   Megan Salon, MD  Multiple Vitamins-Minerals (CENTRUM SILVER PO) Take 1 tablet by mouth daily.    Historical Provider, MD  oseltamivir (TAMIFLU) 75 MG capsule Take 1 capsule (75 mg total) by mouth 2 (two) times daily. 12/03/16   Percell Miller Saguier, PA-C  Polyethyl Glycol-Propyl Glycol (SYSTANE) 0.4-0.3 % SOLN Apply 1 drop to eye every morning.    Historical Provider, MD  Probiotic Product (PROBIOTIC DAILY PO) Take 1 capsule by mouth daily.    Historical Provider, MD  sertraline (ZOLOFT)  25 MG tablet TAKE 1 TABLET BY MOUTH EVERY DAY Patient taking differently: TAKE 1 TABLET BY MOUTH EVERY DAY--  takes in am 09/06/16   Mosie Lukes, MD    Family History Family History  Problem Relation Age of Onset  . Arthritis Mother   . Hypertension Mother   . Deep vein thrombosis Mother     recurrent, secondary to Bleeding disorder  . Arthritis Father   . Stroke Father   . Diabetes Sister   . Heart disease Sister   . Obesity Sister   . Arthritis Sister   . Hypertension Sister   . Heart disease Maternal Grandmother   . Heart disease Maternal Grandfather   . Peripheral vascular disease Paternal Grandmother     s/p leg amputation  . Stroke Paternal Grandfather     Social History Social History  Substance Use Topics  . Smoking status: Former Smoker    Years: 10.00    Quit date: 05/04/1964  . Smokeless tobacco: Never Used  . Alcohol use 2.0 oz/week    4 Standard drinks or equivalent per week     Comment: social     Allergies   Bee venom; Codeine; Penicillins; Lodine [etodolac]; and Tramadol   Review of Systems Review of Systems  Respiratory: Positive for cough.   Cardiovascular: Positive for chest pain (right  sided).  All other systems reviewed and are negative.    Physical Exam Updated Vital Signs BP 189/98 (BP Location: Right Arm)   Pulse 89   Temp 98.2 F (36.8 C) (Oral)   Resp 22   Ht 5\' 4"  (1.626 m)   Wt 125 lb (56.7 kg)   LMP 01/13/2013 Comment: spotting-had benign endometrial biopsy   SpO2 96%   BMI 21.46 kg/m   Physical Exam  Constitutional: She is oriented to person, place, and time. She appears well-developed and well-nourished.  HENT:  Head: Normocephalic and atraumatic.  Eyes: Conjunctivae are normal.  Neck: Neck supple.  Cardiovascular: Normal rate and regular rhythm.   Pulmonary/Chest: Effort normal. She has wheezes. She exhibits no tenderness.  Expiratory wheezing in all lung fields. No chest wall tenderness.  Abdominal: Soft. Bowel sounds are normal.  Musculoskeletal: Normal range of motion.  Neurological: She is alert and oriented to person, place, and time.  Skin: Skin is warm and dry.  Psychiatric: She has a normal mood and affect. Her behavior is normal.  Nursing note and vitals reviewed.   ED Treatments / Results  DIAGNOSTIC STUDIES: Oxygen Saturation is 96% on RA, normal by my interpretation.  COORDINATION OF CARE: 7:15 PM-Discussed treatment plan with pt at bedside and pt agreed to plan.   Labs (all labs ordered are listed, but only abnormal results are displayed) Labs Reviewed - No data to display  EKG  EKG Interpretation None       Radiology Dg Chest 2 View  Result Date: 12/06/2016 CLINICAL DATA:  Cough and fever for 3 days EXAM: CHEST  2 VIEW COMPARISON:  01/06/2009 FINDINGS: Cardiac shadow is within normal limits. The lungs are well aerated bilaterally. Mild scarring is again seen in the left base. No new focal abnormality is noted. Nipple shadows are again identified. IMPRESSION: No acute abnormality noted. Electronically Signed   By: Inez Catalina M.D.   On: 12/06/2016 19:07    Procedures Procedures (including critical care  time)  Medications Ordered in ED Medications  ipratropium-albuterol (DUONEB) 0.5-2.5 (3) MG/3ML nebulizer solution 3 mL (3 mLs Nebulization Given 12/06/16 1925)  albuterol (  PROVENTIL) (2.5 MG/3ML) 0.083% nebulizer solution 2.5 mg (2.5 mg Nebulization Given 12/06/16 1925)  ipratropium-albuterol (DUONEB) 0.5-2.5 (3) MG/3ML nebulizer solution 3 mL (3 mLs Nebulization Given 12/06/16 2009)  albuterol (PROVENTIL) (2.5 MG/3ML) 0.083% nebulizer solution 2.5 mg (2.5 mg Nebulization Given 12/06/16 2009)     Initial Impression / Assessment and Plan / ED Course  I have reviewed the triage vital signs and the nursing notes.  Pertinent labs & imaging results that were available during my care of the patient were reviewed by me and considered in my medical decision making (see chart for details).    Patient is an 83 year old female returning today for right-sided chest wall pain, ongoing cough and occasional shortness of breath. She was seen several days ago and at that time was given an inhaler and antibiotic and Tamiflu as she was flu positive. She has recently started taking the antibiotic but because of the chest pain 1 and to be evaluated. Low suspicion for PE. Chest x-ray with no evidence of pneumonia or pneumothorax. Patient is wheezing diffusely. We'll give albuterol and Atrovent here. However patient is not a candidate for prednisone because she is getting live BCG infusion for bladder cancer.  After second breathing treatment patient's wheezing has resolved. She feels much better. She is able to walk to the bathroom without shortness of breath. She has an inhaler at home and she was discharged. She'll follow-up with her doctor later this week for recheck.  Final Clinical Impressions(s) / ED Diagnoses   Final diagnoses:  Bronchitis    New Prescriptions Discharge Medication List as of 12/06/2016  8:40 PM    I personally performed the services described in this documentation, which was scribed in my  presence.  The recorded information has been reviewed and considered.     Blanchie Dessert, MD 12/06/16 2330

## 2016-12-06 NOTE — ED Triage Notes (Signed)
Patient reports currently receiving tx for bladder cancer.  States that she tested positive for flu on Friday.  Reports cough since Friday but states that she has had increased shortness of breath with wheezing.  Last tx was Tuesday.

## 2016-12-06 NOTE — ED Notes (Signed)
Page sent to Dr Pilar Jarvis for urology consult.

## 2016-12-06 NOTE — ED Notes (Signed)
Dr. Maryan Rued into room post neb tx

## 2016-12-06 NOTE — ED Notes (Signed)
Dr. Maryan Rued into room.

## 2016-12-06 NOTE — ED Notes (Signed)
RT at BS.

## 2016-12-06 NOTE — ED Notes (Signed)
Pt alert, NAD, calm, interactive, resps e/u, fragile respirations, easily provoked non-productive cough, speaking in sentences, "feel better after breathing tx", VSS, afebrile.

## 2016-12-07 ENCOUNTER — Telehealth: Payer: Self-pay | Admitting: Family Medicine

## 2016-12-07 DIAGNOSIS — C672 Malignant neoplasm of lateral wall of bladder: Secondary | ICD-10-CM | POA: Diagnosis not present

## 2016-12-07 DIAGNOSIS — Z5111 Encounter for antineoplastic chemotherapy: Secondary | ICD-10-CM | POA: Diagnosis not present

## 2016-12-07 NOTE — Telephone Encounter (Signed)
Pt called in she says that she was seen in the ed and dx with the flu. She received 2 breathing treatments and was advised to follow up with primary office on Friday. Informed pt that Im not showing any opening w/PCP for Friday. Please advise.    CB: [phone]

## 2016-12-08 NOTE — Telephone Encounter (Signed)
I could meet her at 7:30

## 2016-12-08 NOTE — Telephone Encounter (Signed)
Yes, pt says that she can come in this Friday at 7:30 to meet with provider. Pt has been scheduled.    Thanks Dr. Jacinto Reap!

## 2016-12-10 ENCOUNTER — Other Ambulatory Visit: Payer: Self-pay | Admitting: Family Medicine

## 2016-12-10 ENCOUNTER — Ambulatory Visit (INDEPENDENT_AMBULATORY_CARE_PROVIDER_SITE_OTHER): Payer: Medicare Other | Admitting: Family Medicine

## 2016-12-10 ENCOUNTER — Encounter: Payer: Self-pay | Admitting: Family Medicine

## 2016-12-10 VITALS — BP 116/72 | HR 89 | Temp 97.6°F | Wt 122.2 lb

## 2016-12-10 DIAGNOSIS — M542 Cervicalgia: Secondary | ICD-10-CM | POA: Diagnosis not present

## 2016-12-10 DIAGNOSIS — J209 Acute bronchitis, unspecified: Secondary | ICD-10-CM

## 2016-12-10 DIAGNOSIS — I10 Essential (primary) hypertension: Secondary | ICD-10-CM | POA: Diagnosis not present

## 2016-12-10 MED ORDER — BENZONATATE 100 MG PO CAPS: 200.0000 mg | ORAL_CAPSULE | Freq: Three times a day (TID) | ORAL | 0 refills | Status: DC | PRN

## 2016-12-10 MED ORDER — AEROCHAMBER PLUS FLO-VU MEDIUM MISC: 1.0000 | Freq: Once | Status: DC

## 2016-12-10 MED ORDER — SERTRALINE HCL 25 MG PO TABS: ORAL_TABLET | ORAL | 0 refills | Status: DC

## 2016-12-10 MED ORDER — ALBUTEROL SULFATE HFA 108 (90 BASE) MCG/ACT IN AERS: 2.0000 | INHALATION_SPRAY | Freq: Four times a day (QID) | RESPIRATORY_TRACT | 0 refills | Status: DC | PRN

## 2016-12-10 NOTE — Progress Notes (Signed)
Pre visit review using our clinic review tool, if applicable. No additional management support is needed unless otherwise documented below in the visit note. 

## 2016-12-10 NOTE — Patient Instructions (Signed)
Salon Pas gel to the neck as needed Acute Bronchitis, Adult Acute bronchitis is when air tubes (bronchi) in the lungs suddenly get swollen. The condition can make it hard to breathe. It can also cause these symptoms:  A cough.  Coughing up clear, yellow, or green mucus.  Wheezing.  Chest congestion.  Shortness of breath.  A fever.  Body aches.  Chills.  A sore throat. Follow these instructions at home: Medicines  Take over-the-counter and prescription medicines only as told by your doctor.  If you were prescribed an antibiotic medicine, take it as told by your doctor. Do not stop taking the antibiotic even if you start to feel better. General instructions  Rest.  Drink enough fluids to keep your pee (urine) clear or pale yellow.  Avoid smoking and secondhand smoke. If you smoke and you need help quitting, ask your doctor. Quitting will help your lungs heal faster.  Use an inhaler, cool mist vaporizer, or humidifier as told by your doctor.  Keep all follow-up visits as told by your doctor. This is important. How is this prevented? To lower your risk of getting this condition again:  Wash your hands often with soap and water. If you cannot use soap and water, use hand sanitizer.  Avoid contact with people who have cold symptoms.  Try not to touch your hands to your mouth, nose, or eyes.  Make sure to get the flu shot every year. Contact a doctor if:  Your symptoms do not get better in 2 weeks. Get help right away if:  You cough up blood.  You have chest pain.  You have very bad shortness of breath.  You become dehydrated.  You faint (pass out) or keep feeling like you are going to pass out.  You keep throwing up (vomiting).  You have a very bad headache.  Your fever or chills gets worse. This information is not intended to replace advice given to you by your health care provider. Make sure you discuss any questions you have with your health care  provider. Document Released: 04/19/2008 Document Revised: 06/09/2016 Document Reviewed: 04/21/2016 Elsevier Interactive Patient Education  2017 [employer].

## 2016-12-10 NOTE — Progress Notes (Signed)
Subjective:    Patient ID: Stephanie [name], female    DOB: 1931-11-17, 83 y.o.   MRN: [id number]  No chief complaint on file.   HPI Patient is in today for an acute follow up visit due to having the flu last week. She was seen in ER for cough and SOB and was started on steroids, she reports she is improving although she still endorses cough and some wheezing. Albuterol is only marginally helpful. No fevers or chills. Notes the return neck pain which she has had to have surgically dealt with in past. Is requesting referral back to her neurosurgeon. No recent falll or trauma. Denies CP/palp/SOB/HA/fevers/GI or GU c/o. Taking meds as prescribed  Past Medical History:  Diagnosis Date  . Benign hypertension without congestive heart failure   . Benign mole    right hcets mole, bleeds when dried with towel  . Bilateral dry eyes   . Bladder cancer ([place])   . Bladder cancer ([place]) 10/17/2016  . Cervical spondylosis   . Cervicalgia   . Depression with anxiety 10/17/2016  . Gross hematuria   . History of kidney stones    1970's  . Lumbar stenosis   . OA (osteoarthritis)   . Parkinson disease [facility]) dx 2012   neurologist-  dr siddiqui at [facility]    Past Surgical History:  Procedure Laterality Date  . BIOPSY MASS LEFT FIRST METACARPAL   06/23/2006   benign  . CATARACT EXTRACTION W/ INTRAOCULAR LENS  IMPLANT, BILATERAL  1999 and 2003  . CYSTOSCOPY W/ RETROGRADES Bilateral 09/22/2016   Procedure: CYSTOSCOPY WITH RETROGRADE PYELOGRAM;  Surgeon: Alexis Frock, MD;  Location: [facility];  Service: Urology;  Laterality: Bilateral;  . D & C HYSTEROSCOPY W/ RESECTION POLYP  07/11/2000  . DILATION AND CURETTAGE OF UTERUS  1960's  . EXCISION CYST AND DEBRIDEMENT RIGHT WIRST AND REMOVAL [place] BODY  01/09/2009  . LAPAROSCOPY  yrs ago   infertility   . METACARPOPHALANGEAL JOINT ARTHRODESIS Right 05/25/1996  . TONSILLECTOMY AND ADENOIDECTOMY  child  . TOTAL HIP ARTHROPLASTY Left  07/07/2016   Procedure: LEFT TOTAL HIP ARTHROPLASTY ANTERIOR APPROACH;  Surgeon: Gaynelle Arabian, MD;  Location: [facility];  Service: Orthopedics;  Laterality: Left;  . TRANSURETHRAL RESECTION OF BLADDER TUMOR N/A 09/22/2016   Procedure: TRANSURETHRAL RESECTION OF BLADDER TUMOR (TURBT);  Surgeon: Alexis Frock, MD;  Location: [facility];  Service: Urology;  Laterality: N/A;    Family History  Problem Relation Age of Onset  . Arthritis Mother   . Hypertension Mother   . Deep vein thrombosis Mother     recurrent, secondary to Bleeding disorder  . Arthritis Father   . Stroke Father   . Diabetes Sister   . Heart disease Sister   . Obesity Sister   . Arthritis Sister   . Hypertension Sister   . Heart disease Maternal Grandmother   . Heart disease Maternal Grandfather   . Peripheral vascular disease Paternal Grandmother     s/p leg amputation  . Stroke Paternal Grandfather     Social History   Social History  . Marital status: Widowed    Spouse name: N/A  . Number of children: N/A  . Years of education: N/A   Occupational History  . Not on file.   Social History Main Topics  . Smoking status: Former Smoker    Years: 10.00    Quit date: 05/04/1964  . Smokeless tobacco: Never Used  . Alcohol use 2.0  oz/week    4 Standard drinks or equivalent per week     Comment: social  . Drug use: No  . Sexual activity: No   Other Topics Concern  . Not on file   Social History Narrative  . No narrative on file    Outpatient Medications Prior to Visit  Medication Sig Dispense Refill  . acetaminophen (TYLENOL) 500 MG tablet Take 1,000 mg by mouth every 6 (six) hours as needed for moderate pain.    Marland Kitchen CALCIUM-MAGNESIUM-ZINC PO Take by mouth daily.    . carbidopa-levodopa (SINEMET IR) 25-100 MG per tablet Take 1 tablet by mouth 3 (three) times daily.     . Cholecalciferol (VITAMIN D) 2000 units CAPS Take 1 capsule by mouth daily.    . Coenzyme Q10 (COQ-10 PO) Take 300 mg by  mouth daily.    . diazepam (VALIUM) 5 MG tablet Take 1 tablet (5 mg total) by mouth 2 (two) times daily as needed for anxiety. 60 tablet 1  . diclofenac (VOLTAREN) 50 MG EC tablet Take 50 mg by mouth 2 (two) times daily.    Marland Kitchen EPINEPHrine 0.3 mg/0.3 mL IJ SOAJ injection Inject 0.3 mLs (0.3 mg total) into the muscle once. (Patient taking differently: Inject 0.3 mg into the muscle once as needed (allergic reaction). ) 2 Device 2  . estradiol (ESTRACE) 0.5 MG tablet TAKE 1/2 TABLET BY MOUTH EVERY DAY (Patient taking differently: TAKE 1/2 TABLET BY MOUTH EVERY DAY--  takes in am) 45 tablet 3  . hydrochlorothiazide (HYDRODIURIL) 25 MG tablet Take 1.5 tablets (37.5 mg total) by mouth daily. (Patient taking differently: Take 37.5 mg by mouth every morning. Takes one and half tablets = 37.5mg ) 135 tablet 0  . loratadine (CLARITIN) 10 MG tablet Take 10 mg by mouth daily.    . medroxyPROGESTERone (PROVERA) 2.5 MG tablet TAKE 1/2 TABLET(1.25 MG) BY MOUTH DAILY (Patient taking differently: TAKE 1/2 TABLET(1.25 MG) BY MOUTH DAILY--- takes in am) 45 tablet 3  . Multiple Vitamins-Minerals (CENTRUM SILVER PO) Take 1 tablet by mouth daily.    Vladimir Faster Glycol-Propyl Glycol (SYSTANE) 0.4-0.3 % SOLN Apply 1 drop to eye every morning.    . Probiotic Product (PROBIOTIC DAILY PO) Take 1 capsule by mouth daily.    Marland Kitchen albuterol (PROVENTIL HFA;VENTOLIN HFA) 108 (90 Base) MCG/ACT inhaler Inhale 2 puffs into the lungs every 6 (six) hours as needed for wheezing or shortness of breath. 1 Inhaler 0  . azithromycin (ZITHROMAX) 250 MG tablet Take 2 tablets by mouth on day 1, followed by 1 tablet by mouth daily for 4 days. 6 tablet 0  . benzonatate (TESSALON) 100 MG capsule Take 1 capsule (100 mg total) by mouth 3 (three) times daily as needed. 21 capsule 0  . oseltamivir (TAMIFLU) 75 MG capsule Take 1 capsule (75 mg total) by mouth 2 (two) times daily. 10 capsule 0  . sertraline (ZOLOFT) 25 MG tablet TAKE 1 TABLET BY MOUTH EVERY DAY  (Patient taking differently: TAKE 1 TABLET BY MOUTH EVERY DAY--  takes in am) 90 tablet 0   No facility-administered medications prior to visit.     Allergies  Allergen Reactions  . Bee Venom Swelling    Yellow jackets, white and black face hornets, local swelling only  . Codeine Nausea And Vomiting  . Penicillins Itching and Swelling  . Lodine [Etodolac] Rash  . Tramadol Itching and Rash    Review of Systems  Constitutional: Negative for fever and malaise/fatigue.  HENT: Positive for  congestion.   Eyes: Negative for blurred vision.  Respiratory: Positive for cough, shortness of breath and wheezing.   Cardiovascular: Negative for chest pain, palpitations and leg swelling.  Gastrointestinal: Negative for vomiting.  Musculoskeletal: Positive for neck pain. Negative for back pain.  Skin: Negative for rash.  Neurological: Negative for loss of consciousness and headaches.       Objective:    Physical Exam  Constitutional: She is oriented to person, place, and time. She appears well-developed and well-nourished. No distress.  HENT:  Head: Normocephalic and atraumatic.  Eyes: Conjunctivae are normal.  Neck: Normal range of motion. No thyromegaly present.  Cardiovascular: Normal rate and regular rhythm.   Pulmonary/Chest: Effort normal and breath sounds normal. She has no wheezes.  Rhonchi b/l bases  Abdominal: Soft. Bowel sounds are normal. There is no tenderness.  Musculoskeletal: Normal range of motion. She exhibits no edema or deformity.  Neurological: She is alert and oriented to person, place, and time.  Skin: Skin is warm and dry. She is not diaphoretic.  Psychiatric: She has a normal mood and affect.    BP 116/72 (BP Location: Left Arm, Patient Position: Sitting, Cuff Size: Normal)   Pulse 89   Temp 97.6 F (36.4 C) (Oral)   Wt 122 lb 3.2 oz (55.4 kg)   LMP 01/13/2013 Comment: spotting-had benign endometrial biopsy   SpO2 97%   BMI 20.98 kg/m  Wt Readings from  Last 3 Encounters:  12/10/16 122 lb 3.2 oz (55.4 kg)  12/06/16 125 lb (56.7 kg)  12/03/16 125 lb 2 oz (56.8 kg)     Lab Results  Component Value Date   WBC 6.7 07/19/2016   HGB 13.6 09/22/2016   HCT 40.0 09/22/2016   PLT 285 07/19/2016   GLUCOSE 96 09/22/2016   CHOL 186 06/11/2015   TRIG 117.0 06/11/2015   HDL 46.00 06/11/2015   LDLDIRECT 120.0 12/11/2014   LDLCALC 117 (H) 06/11/2015   ALT 21 06/29/2016   AST 35 06/29/2016   NA 138 09/22/2016   K 3.6 09/22/2016   CL 98 (L) 09/22/2016   CREATININE 0.70 09/22/2016   BUN 22 (H) 09/22/2016   CO2 28 07/19/2016   TSH 1.06 12/11/2014   INR 0.96 06/29/2016    Lab Results  Component Value Date   TSH 1.06 12/11/2014   Lab Results  Component Value Date   WBC 6.7 07/19/2016   HGB 13.6 09/22/2016   HCT 40.0 09/22/2016   MCV 85.6 07/19/2016   PLT 285 07/19/2016   Lab Results  Component Value Date   NA 138 09/22/2016   K 3.6 09/22/2016   CO2 28 07/19/2016   GLUCOSE 96 09/22/2016   BUN 22 (H) 09/22/2016   CREATININE 0.70 09/22/2016   BILITOT 1.0 06/29/2016   ALKPHOS 60 06/29/2016   AST 35 06/29/2016   ALT 21 06/29/2016   PROT 7.5 06/29/2016   ALBUMIN 5.2 (H) 06/29/2016   CALCIUM 8.7 (L) 07/19/2016   ANIONGAP 6 07/19/2016   GFR 78.75 06/11/2015   Lab Results  Component Value Date   CHOL 186 06/11/2015   Lab Results  Component Value Date   HDL 46.00 06/11/2015   Lab Results  Component Value Date   LDLCALC 117 (H) 06/11/2015   Lab Results  Component Value Date   TRIG 117.0 06/11/2015   Lab Results  Component Value Date   CHOLHDL 4 06/11/2015   No results found for: HGBA1C     Assessment & Plan:   Problem  List Items Addressed This Visit    HTN (hypertension)    Well controlled, no changes to meds. Encouraged heart healthy diet such as the DASH diet and exercise as tolerated.       Neck pain - Primary    Encouraged moist heat and gentle stretching as tolerated. May try NSAIDs and prescription  meds as directed and report if symptoms worsen or seek immediate care. She is requesting a referral back to her neurosurgeon to discuss her worsening pain, referral placed.      Relevant Orders   Ambulatory referral to Neurosurgery   Acute bronchitis    Improving with some steroids but she does not think the Albuterol gives her great relief. She is given a prescription for a spacer to add to her inhaler and she should seek care if symptoms worsen.       Relevant Medications   AEROCHAMBER PLUS FLO-VU MEDIUM MISC 1 each      I have discontinued Ms. Leavell's oseltamivir and azithromycin. I have also changed her benzonatate. Additionally, I am having her maintain her carbidopa-levodopa, EPINEPHrine, medroxyPROGESTERone, estradiol, hydrochlorothiazide, acetaminophen, Polyethyl Glycol-Propyl Glycol, diclofenac, diazepam, Probiotic Product (PROBIOTIC DAILY PO), Multiple Vitamins-Minerals (CENTRUM SILVER PO), CALCIUM-MAGNESIUM-ZINC PO, Vitamin D, Coenzyme Q10 (COQ-10 PO), loratadine, and albuterol. We will continue to administer AEROCHAMBER PLUS FLO-VU MEDIUM.  Meds ordered this encounter  Medications  . benzonatate (TESSALON) 100 MG capsule    Sig: Take 2 capsules (200 mg total) by mouth 3 (three) times daily as needed.    Dispense:  30 capsule    Refill:  0  . albuterol (PROVENTIL HFA;VENTOLIN HFA) 108 (90 Base) MCG/ACT inhaler    Sig: Inhale 2 puffs into the lungs every 6 (six) hours as needed for wheezing or shortness of breath.    Dispense:  1 Inhaler    Refill:  0  . AEROCHAMBER PLUS FLO-VU MEDIUM MISC 1 each    CMA served as scribe during this visit. History, Physical and Plan performed by medical provider. Documentation and orders reviewed and attested to.  Penni Homans, MD

## 2016-12-12 DIAGNOSIS — J209 Acute bronchitis, unspecified: Secondary | ICD-10-CM | POA: Insufficient documentation

## 2016-12-12 DIAGNOSIS — M542 Cervicalgia: Secondary | ICD-10-CM | POA: Insufficient documentation

## 2016-12-12 NOTE — Assessment & Plan Note (Signed)
Improving with some steroids but she does not think the Albuterol gives her great relief. She is given a prescription for a spacer to add to her inhaler and she should seek care if symptoms worsen.

## 2016-12-12 NOTE — Assessment & Plan Note (Signed)
Well controlled, no changes to meds. Encouraged heart healthy diet such as the DASH diet and exercise as tolerated.  °

## 2016-12-12 NOTE — Assessment & Plan Note (Signed)
Encouraged moist heat and gentle stretching as tolerated. May try NSAIDs and prescription meds as directed and report if symptoms worsen or seek immediate care. She is requesting a referral back to her neurosurgeon to discuss her worsening pain, referral placed.

## 2016-12-14 DIAGNOSIS — C672 Malignant neoplasm of lateral wall of bladder: Secondary | ICD-10-CM | POA: Diagnosis not present

## 2016-12-14 DIAGNOSIS — Z5111 Encounter for antineoplastic chemotherapy: Secondary | ICD-10-CM | POA: Diagnosis not present

## 2016-12-21 DIAGNOSIS — C672 Malignant neoplasm of lateral wall of bladder: Secondary | ICD-10-CM | POA: Diagnosis not present

## 2016-12-21 DIAGNOSIS — Z5111 Encounter for antineoplastic chemotherapy: Secondary | ICD-10-CM | POA: Diagnosis not present

## 2016-12-22 DIAGNOSIS — Z79899 Other long term (current) drug therapy: Secondary | ICD-10-CM | POA: Diagnosis not present

## 2016-12-22 DIAGNOSIS — G20 Parkinson's disease: Secondary | ICD-10-CM | POA: Diagnosis not present

## 2016-12-28 DIAGNOSIS — Z5111 Encounter for antineoplastic chemotherapy: Secondary | ICD-10-CM | POA: Diagnosis not present

## 2016-12-28 DIAGNOSIS — C672 Malignant neoplasm of lateral wall of bladder: Secondary | ICD-10-CM | POA: Diagnosis not present

## 2017-01-04 ENCOUNTER — Ambulatory Visit (INDEPENDENT_AMBULATORY_CARE_PROVIDER_SITE_OTHER): Payer: Medicare Other | Admitting: Family Medicine

## 2017-01-04 ENCOUNTER — Encounter: Payer: Self-pay | Admitting: Family Medicine

## 2017-01-04 VITALS — BP 126/68 | HR 79 | Temp 98.3°F | Resp 18 | Wt 126.4 lb

## 2017-01-04 DIAGNOSIS — M542 Cervicalgia: Secondary | ICD-10-CM | POA: Diagnosis not present

## 2017-01-04 DIAGNOSIS — C679 Malignant neoplasm of bladder, unspecified: Secondary | ICD-10-CM

## 2017-01-04 DIAGNOSIS — E785 Hyperlipidemia, unspecified: Secondary | ICD-10-CM

## 2017-01-04 DIAGNOSIS — M5090 Cervical disc disorder, unspecified, unspecified cervical region: Secondary | ICD-10-CM

## 2017-01-04 DIAGNOSIS — I10 Essential (primary) hypertension: Secondary | ICD-10-CM

## 2017-01-04 DIAGNOSIS — G20 Parkinson's disease: Secondary | ICD-10-CM | POA: Diagnosis not present

## 2017-01-04 LAB — COMPREHENSIVE METABOLIC PANEL
ALT: 7 U/L (ref 0–35)
AST: 25 U/L (ref 0–37)
Albumin: 4.6 g/dL (ref 3.5–5.2)
Alkaline Phosphatase: 45 U/L (ref 39–117)
BILIRUBIN TOTAL: 0.5 mg/dL (ref 0.2–1.2)
BUN: 21 mg/dL (ref 6–23)
CO2: 33 meq/L — AB (ref 19–32)
CREATININE: 0.7 mg/dL (ref 0.40–1.20)
Calcium: 9.5 mg/dL (ref 8.4–10.5)
Chloride: 94 mEq/L — ABNORMAL LOW (ref 96–112)
GFR: 84.94 mL/min (ref >60.00)
GLUCOSE: 99 mg/dL (ref 70–99)
Potassium: 3.8 mEq/L (ref 3.5–5.1)
SODIUM: 132 meq/L — AB (ref 135–145)
Total Protein: 6.6 g/dL (ref 6.0–8.3)

## 2017-01-04 LAB — CBC
HCT: 41 % (ref 36.0–46.0)
Hemoglobin: 13.8 g/dL (ref 12.0–15.0)
MCHC: 33.6 g/dL (ref 30.0–36.0)
MCV: 88.7 fl (ref 78.0–100.0)
Platelets: 174 10*3/uL (ref 150.0–400.0)
RBC: 4.62 Mil/uL (ref 3.87–5.11)
RDW: 14.1 % (ref 11.5–15.5)
WBC: 5 10*3/uL (ref 4.0–10.5)

## 2017-01-04 LAB — LIPID PANEL
CHOL/HDL RATIO: 4
Cholesterol: 178 mg/dL (ref 0–200)
HDL: 44.3 mg/dL (ref >39.00)
LDL CALC: 94 mg/dL (ref 0–99)
NonHDL: 133.47
Triglycerides: 198 mg/dL — ABNORMAL HIGH (ref 0.0–149.0)
VLDL: 39.6 mg/dL (ref 0.0–40.0)

## 2017-01-04 LAB — TSH: TSH: 0.84 u[IU]/mL (ref 0.35–4.50)

## 2017-01-04 MED ORDER — TIZANIDINE HCL 2 MG PO TABS: 2.0000 mg | ORAL_TABLET | Freq: Three times a day (TID) | ORAL | 1 refills | Status: DC | PRN

## 2017-01-04 NOTE — Assessment & Plan Note (Addendum)
Continues to struggle with worsening right sided spasm and pain, referred back to neurosurgery and encouraged to consider dry needling. Has tried Acupuncture, chiropractic and massage. Lidocaine topical has been mildly helpful. Allowed to try Tizanidine 2 mg qhs prn

## 2017-01-04 NOTE — Progress Notes (Signed)
nromal except for sodium is down slightly decrea fluid intake by one beverage daily

## 2017-01-04 NOTE — Assessment & Plan Note (Signed)
Well controlled, no changes to meds. Encouraged heart healthy diet such as the DASH diet and exercise as tolerated.  °

## 2017-01-04 NOTE — Assessment & Plan Note (Signed)
Follows with [facility] Dr Deboraha Sprang and doing well

## 2017-01-04 NOTE — Progress Notes (Signed)
Patient ID: Stephanie [name], female   DOB: [birth year], 83 y.o.   MRN: [id number]   Subjective:    Patient ID: Stephanie [name], female    DOB: 05/14/1932, 83 y.o.   MRN: [id number] I acted as a Education administrator for Dr. Charlett Blake. [place], [place]  Chief Complaint  Patient presents with  . Follow-up  . Hypertension  . Neck Pain    Hypertension  Associated symptoms include neck pain. Pertinent negatives include no blurred vision, chest pain, headaches, malaise/fatigue, palpitations or shortness of breath.  Neck Pain   This is a recurrent problem. The current episode started more than 1 month ago. The problem occurs constantly. The problem has been gradually worsening. The quality of the pain is described as aching and cramping. Pertinent negatives include no chest pain, fever or headaches.    Patient is in today for 3 month follow up on hypertension, Hyperlipidemia, and neck pain. She continues to struggle with significant pain in the right side of her neck. She is following with Dr. Linus Mako of neurology at [facility] for her ongoing Parkinson's disease and he has recommended she have a consultation with a neurosurgeon there. She has previously seen a neurosurgeon there but would like to see one who specializes in cervical concerns. She has had no recent fall or trauma. She denies any radicular symptoms. She had her last chemotherapy treatment last week for bladder cancer and is feeling somewhat improved. Has follow-up with her urologist soon. Denies CP/palp/SOB/HA/congestion/fevers/GI or GU c/o. Taking meds as prescribed  Past Medical History:  Diagnosis Date  . Benign hypertension without congestive heart failure   . Benign mole    right hcets mole, bleeds when dried with towel  . Bilateral dry eyes   . Bladder cancer ([place])   . Bladder cancer ([place]) 10/17/2016  . Cervical spondylosis   . Cervicalgia   . Depression with anxiety 10/17/2016  . Gross hematuria   . History of kidney stones    1970's    . Lumbar stenosis   . OA (osteoarthritis)   . Parkinson disease [facility]) dx 2012   neurologist-  dr siddiqui at [facility]    Past Surgical History:  Procedure Laterality Date  . BIOPSY MASS LEFT FIRST METACARPAL   06/23/2006   benign  . CATARACT EXTRACTION W/ INTRAOCULAR LENS  IMPLANT, BILATERAL  1999 and 2003  . CYSTOSCOPY W/ RETROGRADES Bilateral 09/22/2016   Procedure: CYSTOSCOPY WITH RETROGRADE PYELOGRAM;  Surgeon: Alexis Frock, MD;  Location: [facility];  Service: Urology;  Laterality: Bilateral;  . D & C HYSTEROSCOPY W/ RESECTION POLYP  07/11/2000  . DILATION AND CURETTAGE OF UTERUS  1960's  . EXCISION CYST AND DEBRIDEMENT RIGHT WIRST AND REMOVAL [place] BODY  01/09/2009  . LAPAROSCOPY  yrs ago   infertility   . METACARPOPHALANGEAL JOINT ARTHRODESIS Right 05/25/1996  . TONSILLECTOMY AND ADENOIDECTOMY  child  . TOTAL HIP ARTHROPLASTY Left 07/07/2016   Procedure: LEFT TOTAL HIP ARTHROPLASTY ANTERIOR APPROACH;  Surgeon: Gaynelle Arabian, MD;  Location: [facility];  Service: Orthopedics;  Laterality: Left;  . TRANSURETHRAL RESECTION OF BLADDER TUMOR N/A 09/22/2016   Procedure: TRANSURETHRAL RESECTION OF BLADDER TUMOR (TURBT);  Surgeon: Alexis Frock, MD;  Location: [facility];  Service: Urology;  Laterality: N/A;    Family History  Problem Relation Age of Onset  . Arthritis Mother   . Hypertension Mother   . Deep vein thrombosis Mother     recurrent, secondary to Bleeding disorder  .  Arthritis Father   . Stroke Father   . Diabetes Sister   . Heart disease Sister   . Obesity Sister   . Arthritis Sister   . Hypertension Sister   . Heart disease Maternal Grandmother   . Heart disease Maternal Grandfather   . Peripheral vascular disease Paternal Grandmother     s/p leg amputation  . Stroke Paternal Grandfather     Social History   Social History  . Marital status: Widowed    Spouse name: N/A  . Number of children: N/A  . Years of education:  N/A   Occupational History  . Not on file.   Social History Main Topics  . Smoking status: Former Smoker    Years: 10.00    Quit date: 05/04/1964  . Smokeless tobacco: Never Used  . Alcohol use 2.0 oz/week    4 Standard drinks or equivalent per week     Comment: social  . Drug use: No  . Sexual activity: No   Other Topics Concern  . Not on file   Social History Narrative  . No narrative on file    Outpatient Medications Prior to Visit  Medication Sig Dispense Refill  . acetaminophen (TYLENOL) 500 MG tablet Take 1,000 mg by mouth every 6 (six) hours as needed for moderate pain.    Marland Kitchen albuterol (PROVENTIL HFA;VENTOLIN HFA) 108 (90 Base) MCG/ACT inhaler Inhale 2 puffs into the lungs every 6 (six) hours as needed for wheezing or shortness of breath. 1 Inhaler 0  . benzonatate (TESSALON) 100 MG capsule Take 2 capsules (200 mg total) by mouth 3 (three) times daily as needed. 30 capsule 0  . CALCIUM-MAGNESIUM-ZINC PO Take by mouth daily.    . carbidopa-levodopa (SINEMET IR) 25-100 MG per tablet Take 1 tablet by mouth 3 (three) times daily.     . Cholecalciferol (VITAMIN D) 2000 units CAPS Take 1 capsule by mouth daily.    . Coenzyme Q10 (COQ-10 PO) Take 300 mg by mouth daily.    . diazepam (VALIUM) 5 MG tablet Take 1 tablet (5 mg total) by mouth 2 (two) times daily as needed for anxiety. 60 tablet 1  . diclofenac (VOLTAREN) 50 MG EC tablet Take 50 mg by mouth 2 (two) times daily.    Marland Kitchen EPINEPHrine 0.3 mg/0.3 mL IJ SOAJ injection Inject 0.3 mLs (0.3 mg total) into the muscle once. (Patient taking differently: Inject 0.3 mg into the muscle once as needed (allergic reaction). ) 2 Device 2  . estradiol (ESTRACE) 0.5 MG tablet TAKE 1/2 TABLET BY MOUTH EVERY DAY (Patient taking differently: TAKE 1/2 TABLET BY MOUTH EVERY DAY--  takes in am) 45 tablet 3  . hydrochlorothiazide (HYDRODIURIL) 25 MG tablet Take 1.5 tablets (37.5 mg total) by mouth daily. (Patient taking differently: Take 37.5 mg by  mouth every morning. Takes one and half tablets = 37.5mg ) 135 tablet 0  . loratadine (CLARITIN) 10 MG tablet Take 10 mg by mouth daily.    . medroxyPROGESTERone (PROVERA) 2.5 MG tablet TAKE 1/2 TABLET(1.25 MG) BY MOUTH DAILY (Patient taking differently: TAKE 1/2 TABLET(1.25 MG) BY MOUTH DAILY--- takes in am) 45 tablet 3  . Multiple Vitamins-Minerals (CENTRUM SILVER PO) Take 1 tablet by mouth daily.    Vladimir Faster Glycol-Propyl Glycol (SYSTANE) 0.4-0.3 % SOLN Apply 1 drop to eye every morning.    . Probiotic Product (PROBIOTIC DAILY PO) Take 1 capsule by mouth daily.    . sertraline (ZOLOFT) 25 MG tablet TAKE 1 TABLET BY MOUTH  EVERY DAY--  takes in am 90 tablet 0   Facility-Administered Medications Prior to Visit  Medication Dose Route Frequency Provider Last Rate Last Dose  . AEROCHAMBER PLUS FLO-VU MEDIUM MISC 1 each  1 each Other Once Mosie Lukes, MD        Allergies  Allergen Reactions  . Bee Venom Swelling    Yellow jackets, white and black face hornets, local swelling only  . Codeine Nausea And Vomiting  . Penicillins Itching and Swelling  . Lodine [Etodolac] Rash  . Tramadol Itching and Rash    Review of Systems  Constitutional: Negative for fever and malaise/fatigue.  HENT: Negative for congestion.   Eyes: Negative for blurred vision.  Respiratory: Negative for cough and shortness of breath.   Cardiovascular: Negative for chest pain, palpitations and leg swelling.  Gastrointestinal: Negative for vomiting.  Musculoskeletal: Positive for neck pain. Negative for back pain.  Skin: Negative for rash.  Neurological: Negative for loss of consciousness and headaches.       Objective:    Physical Exam  Constitutional: She is oriented to person, place, and time. She appears well-developed and well-nourished. No distress.  HENT:  Head: Normocephalic and atraumatic.  Eyes: Conjunctivae are normal.  Neck: Normal range of motion. No thyromegaly present.  Cardiovascular: Normal  rate and regular rhythm.   Pulmonary/Chest: Effort normal and breath sounds normal. She has no wheezes.  Abdominal: Soft. Bowel sounds are normal. There is no tenderness.  Musculoskeletal: Normal range of motion. She exhibits tenderness. She exhibits no edema or deformity.  Painful, tight muscle spasm right neck  Neurological: She is alert and oriented to person, place, and time.  Skin: Skin is warm and dry. She is not diaphoretic.  Psychiatric: She has a normal mood and affect.    BP 126/68 (BP Location: Left Arm, Patient Position: Sitting, Cuff Size: Normal)   Pulse 79   Temp 98.3 F (36.8 C) (Oral)   Resp 18   Wt 126 lb 6.4 oz (57.3 kg)   LMP 01/13/2013 Comment: spotting-had benign endometrial biopsy   SpO2 97%   BMI 21.70 kg/m  Wt Readings from Last 3 Encounters:  01/04/17 126 lb 6.4 oz (57.3 kg)  12/10/16 122 lb 3.2 oz (55.4 kg)  12/06/16 125 lb (56.7 kg)     Lab Results  Component Value Date   WBC 6.7 07/19/2016   HGB 13.6 09/22/2016   HCT 40.0 09/22/2016   PLT 285 07/19/2016   GLUCOSE 96 09/22/2016   CHOL 186 06/11/2015   TRIG 117.0 06/11/2015   HDL 46.00 06/11/2015   LDLDIRECT 120.0 12/11/2014   LDLCALC 117 (H) 06/11/2015   ALT 21 06/29/2016   AST 35 06/29/2016   NA 138 09/22/2016   K 3.6 09/22/2016   CL 98 (L) 09/22/2016   CREATININE 0.70 09/22/2016   BUN 22 (H) 09/22/2016   CO2 28 07/19/2016   TSH 1.06 12/11/2014   INR 0.96 06/29/2016    Lab Results  Component Value Date   TSH 1.06 12/11/2014   Lab Results  Component Value Date   WBC 6.7 07/19/2016   HGB 13.6 09/22/2016   HCT 40.0 09/22/2016   MCV 85.6 07/19/2016   PLT 285 07/19/2016   Lab Results  Component Value Date   NA 138 09/22/2016   K 3.6 09/22/2016   CO2 28 07/19/2016   GLUCOSE 96 09/22/2016   BUN 22 (H) 09/22/2016   CREATININE 0.70 09/22/2016   BILITOT 1.0 06/29/2016   ALKPHOS  60 06/29/2016   AST 35 06/29/2016   ALT 21 06/29/2016   PROT 7.5 06/29/2016   ALBUMIN 5.2 (H)  06/29/2016   CALCIUM 8.7 (L) 07/19/2016   ANIONGAP 6 07/19/2016   GFR 78.75 06/11/2015   Lab Results  Component Value Date   CHOL 186 06/11/2015   Lab Results  Component Value Date   HDL 46.00 06/11/2015   Lab Results  Component Value Date   LDLCALC 117 (H) 06/11/2015   Lab Results  Component Value Date   TRIG 117.0 06/11/2015   Lab Results  Component Value Date   CHOLHDL 4 06/11/2015   No results found for: HGBA1C     Assessment & Plan:   Problem List Items Addressed This Visit    HTN (hypertension)    Well controlled, no changes to meds. Encouraged heart healthy diet such as the DASH diet and exercise as tolerated.       Relevant Orders   CBC   Comprehensive metabolic panel   TSH   Parkinson's disease ([place])    Follows with [facility] Dr Deboraha Sprang and doing well      Hyperlipidemia    Encouraged heart healthy diet, increase exercise, avoid trans fats, consider a krill oil cap daily      Relevant Orders   Lipid panel   Bladder cancer [facility])    Had last chemo treatment last week and is pleased to be done, has appt soon with Dr Tresa Moore or urology for follow up.      Neck pain    Continues to struggle with worsening right sided spasm and pain, referred back to neurosurgery and encouraged to consider dry needling. Has tried Acupuncture, chiropractic and massage. Lidocaine topical has been mildly helpful. Allowed to try Tizanidine 2 mg qhs prn        Other Visit Diagnoses    Cervical neck pain with evidence of disc disease    -  Primary   Relevant Medications   tiZANidine (ZANAFLEX) 2 MG tablet   Other Relevant Orders   Ambulatory referral to Neurosurgery      I am having Ms. Parker start on tiZANidine. I am also having her maintain her carbidopa-levodopa, EPINEPHrine, medroxyPROGESTERone, estradiol, hydrochlorothiazide, acetaminophen, Polyethyl Glycol-Propyl Glycol, diclofenac, diazepam, Probiotic Product (PROBIOTIC DAILY PO), Multiple Vitamins-Minerals (CENTRUM  SILVER PO), CALCIUM-MAGNESIUM-ZINC PO, Vitamin D, Coenzyme Q10 (COQ-10 PO), loratadine, benzonatate, albuterol, and sertraline. We will continue to administer AEROCHAMBER PLUS FLO-VU MEDIUM.  Meds ordered this encounter  Medications  . tiZANidine (ZANAFLEX) 2 MG tablet    Sig: Take 1 tablet (2 mg total) by mouth every 8 (eight) hours as needed for muscle spasms.    Dispense:  30 tablet    Refill:  1    CMA served as scribe during this visit. History, Physical and Plan performed by medical provider. Documentation and orders reviewed and attested to.  Penni Homans, MD

## 2017-01-04 NOTE — Assessment & Plan Note (Signed)
Had last chemo treatment last week and is pleased to be done, has appt soon with Dr Tresa Moore or urology for follow up.

## 2017-01-04 NOTE — Assessment & Plan Note (Signed)
Encouraged heart healthy diet, increase exercise, avoid trans fats, consider a krill oil cap daily 

## 2017-01-04 NOTE — Progress Notes (Signed)
Pre visit review using our clinic review tool, if applicable. No additional management support is needed unless otherwise documented below in the visit note. 

## 2017-01-04 NOTE — Patient Instructions (Signed)

## 2017-01-06 ENCOUNTER — Other Ambulatory Visit: Payer: Self-pay | Admitting: Family Medicine

## 2017-01-06 ENCOUNTER — Telehealth: Payer: Self-pay | Admitting: Family Medicine

## 2017-01-06 DIAGNOSIS — M542 Cervicalgia: Secondary | ICD-10-CM

## 2017-01-06 NOTE — Telephone Encounter (Signed)
Patient needs a referral to [facility] for dry needle

## 2017-01-10 ENCOUNTER — Telehealth: Payer: Self-pay | Admitting: Family Medicine

## 2017-01-10 DIAGNOSIS — H35371 Puckering of macula, right eye: Secondary | ICD-10-CM | POA: Diagnosis not present

## 2017-01-10 DIAGNOSIS — H04123 Dry eye syndrome of bilateral lacrimal glands: Secondary | ICD-10-CM | POA: Diagnosis not present

## 2017-01-10 DIAGNOSIS — Z961 Presence of intraocular lens: Secondary | ICD-10-CM | POA: Diagnosis not present

## 2017-01-10 NOTE — Telephone Encounter (Signed)
Our physical therapist do standardized training before they do it. I do not know the acupunturists in the area so do not know anyone to specifically refer to. I also am afraid her insurance will not pay for a straight acupuncturist. If she wants to confirm that her insurance will cover one I am happy to place a referral but would have to place one to someone I have not worked directly to previously

## 2017-01-10 NOTE — Telephone Encounter (Signed)
Caller name: Relationship to patient: Self Can be reached: [phone] Pharmacy:  Reason for call: Patient states that her daughter did research on Dry Needle Therapy and request that patient goes to a certified acupuntcure specialist. Plse adv

## 2017-01-11 NOTE — Telephone Encounter (Signed)
Patient informed of PCP instructions. Patient was asking to satisfy her daughters concern/question. She will call back if she wants a referral

## 2017-01-11 NOTE — Telephone Encounter (Signed)
Called left message to call back 

## 2017-01-12 ENCOUNTER — Encounter: Payer: Self-pay | Admitting: Physical Therapy

## 2017-01-12 ENCOUNTER — Ambulatory Visit: Payer: Medicare Other | Attending: Family Medicine | Admitting: Physical Therapy

## 2017-01-12 DIAGNOSIS — M542 Cervicalgia: Secondary | ICD-10-CM | POA: Diagnosis not present

## 2017-01-12 DIAGNOSIS — R293 Abnormal posture: Secondary | ICD-10-CM

## 2017-01-12 DIAGNOSIS — R252 Cramp and spasm: Secondary | ICD-10-CM | POA: Diagnosis not present

## 2017-01-12 NOTE — Therapy (Signed)
Stephanie [name], [name], [address] Phone: [phone]   Fax:  [phone]  Physical Therapy Evaluation  Patient Details  Name: MARGUERITTE UTTERBACK MRN: [id number] Date of Birth: 1932/05/16 Referring Provider: Gwyneth Revels MD  Encounter Date: 01/12/2017      PT End of Session - 01/12/17 0851    Visit Number 1   Number of Visits 13   Date for PT Re-Evaluation 02/23/17   Authorization Type Medicare: Kx Mod by 15th visit, progress note by 10th visit   PT Start Time 0800   PT Stop Time 0905   PT Time Calculation (min) 65 min   Activity Tolerance Patient tolerated treatment well;Patient limited by pain   Behavior During Therapy [facility] for tasks assessed/performed      Past Medical History:  Diagnosis Date  . Benign hypertension without congestive heart failure   . Benign mole    right hcets mole, bleeds when dried with towel  . Bilateral dry eyes   . Bladder cancer ([place])   . Bladder cancer ([place]) 10/17/2016  . Cervical spondylosis   . Cervicalgia   . Depression with anxiety 10/17/2016  . Gross hematuria   . History of kidney stones    1970's  . Lumbar stenosis   . OA (osteoarthritis)   . Parkinson disease [facility]) dx 2012   neurologist-  dr siddiqui at [facility]    Past Surgical History:  Procedure Laterality Date  . BIOPSY MASS LEFT FIRST METACARPAL   06/23/2006   benign  . CATARACT EXTRACTION W/ INTRAOCULAR LENS  IMPLANT, BILATERAL  1999 and 2003  . CYSTOSCOPY W/ RETROGRADES Bilateral 09/22/2016   Procedure: CYSTOSCOPY WITH RETROGRADE PYELOGRAM;  Surgeon: Alexis Frock, MD;  Location: [facility];  Service: Urology;  Laterality: Bilateral;  . D & C HYSTEROSCOPY W/ RESECTION POLYP  07/11/2000  . DILATION AND CURETTAGE OF UTERUS  1960's  . EXCISION CYST AND DEBRIDEMENT RIGHT WIRST AND REMOVAL [place] BODY  01/09/2009  . LAPAROSCOPY  yrs ago   infertility   . METACARPOPHALANGEAL JOINT  ARTHRODESIS Right 05/25/1996  . TONSILLECTOMY AND ADENOIDECTOMY  child  . TOTAL HIP ARTHROPLASTY Left 07/07/2016   Procedure: LEFT TOTAL HIP ARTHROPLASTY ANTERIOR APPROACH;  Surgeon: Gaynelle Arabian, MD;  Location[facility];  Service: Orthopedics;  Laterality: Left;  . TRANSURETHRAL RESECTION OF BLADDER TUMOR N/A 09/22/2016   Procedure: TRANSURETHRAL RESECTION OF BLADDER TUMOR (TURBT);  Surgeon: Alexis Frock, MD;  Location: [facility];  Service: Urology;  Laterality: N/A;    There were no vitals filed for this visit.       Subjective Assessment - 01/12/17 0756    Subjective pt is a 83 y.o F with CC of neck pain that started several years ago with gradual onset. pt thinks it is due to arthritis. reports some of the soreness in the R upper shoulder region. pt denies N/T, with most of the pain in the neck and shoulder blade/ neck. reports hx of HA or dizziness and tinnitis in the L ear. Since onset the pain has gotten worse.     Pertinent History hx of bladder cancer   Limitations Lifting;Standing   Diagnostic tests MRI    Patient Stated Goals To get rid of pain, improve quality of life, getting back to walking   Currently in Pain? Yes   Pain Score 5    Pain Location Neck   Pain Orientation Right   Pain Type Chronic pain  Pain Onset More than a month ago   Pain Frequency Constant   Aggravating Factors  moving the head around,    Pain Relieving Factors not moving             [facility] PT Assessment - 01/12/17 0756      Assessment   Medical Diagnosis Neck pain   Referring Provider Gwyneth Revels MD   Onset Date/Surgical Date --  couple of years   Hand Dominance Right   Next MD Visit --  3 months   Prior Therapy yes     Precautions   Precautions None     Restrictions   Weight Bearing Restrictions No     Balance Screen   Has the patient fallen in the past 6 months No   Has the patient had a decrease in activity level because of a fear of falling?  No   Is the  patient reluctant to leave their home because of a fear of falling?  No     Home Environment   Living Environment Private residence   Living Arrangements Alone   Available Help at Discharge Available PRN/intermittently;Family   Type of Hope to enter   Entrance Stairs-Number of Steps 4   Entrance Stairs-Rails Right   Home Layout One level     Prior Function   Level of Independence Independent;Independent with basic ADLs   Vocation Retired   Leisure walking/ exercise, voluntering, sports     Cognition   Overall Cognitive Status Within Functional Limits for tasks assessed     Observation/Other Assessments   Focus on Therapeutic Outcomes (FOTO)  FOTO taken but unable to retrieve score due to FOTO site being down     Posture/Postural Control   Posture/Postural Control Postural limitations   Postural Limitations Rounded Shoulders;Forward head     ROM / Strength   AROM / PROM / Strength AROM;Strength     AROM   AROM Assessment Site Cervical   Cervical Flexion 32  mild ERP   Cervical Extension 38  mild ERP   Cervical - Right Side Bend 18  with pain returning to neutral   Cervical - Left Side Bend 30   Cervical - Right Rotation 40  popping in the neck   Cervical - Left Rotation 60     Strength   Strength Assessment Site Shoulder   Right/Left Shoulder Left;Right     Palpation   Spinal mobility C3-C7 hypomobility PA PAIVM due to pain    Palpation comment upper trap / Levator scapulae soreness with referral of pain to the neck/ head with palpation. soreness at the C3-C7 on the R     Special Tests    Special Tests Cervical   Cervical Tests Spurling's;Dictraction;other                   [facility] Adult PT Treatment/Exercise - 01/12/17 0756      Modalities   Modalities Moist Heat     Moist Heat Therapy   Number Minutes Moist Heat 10 Minutes   Moist Heat Location Shoulder     Manual Therapy   Manual Therapy Soft tissue mobilization    Soft tissue mobilization IASTM over R upper trap          Trigger Point Dry Needling - 01/12/17 0853    Consent Given? Yes   Education Handout Provided Yes   Muscles Treated Upper Body Upper trapezius   Upper Trapezius Response Twitch reponse elicited;Palpable  increased muscle length  R side only              PT Education - 01/12/17 0850    Education provided Yes   Education Details evaluation findings, POC, goals, HEP with proper form/ rationale. Anatmoy of the muscles regarding formation of trigger points and referred pain. What TPDN is, beneifts, what to expect and after care.    Person(s) Educated Patient   Methods Explanation;Verbal cues;Handout   Comprehension Verbalized understanding;Verbal cues required          PT Short Term Goals - 01/12/17 1000      PT SHORT TERM GOAL #1   Title independent with initial HEP (3/21/20180   Time 3   Period Weeks   Status New     PT SHORT TERM GOAL #2   Title demonstrate decreased tightness / spasm in the  R upper trap/ levator scapulae to decrease pain to </=5/10 with movement and promote cervical mobility (02/02/2017)   Time 3   Period Weeks   Status New     PT SHORT TERM GOAL #3   Title she will be able to verbalize and demo proper posture with sitting/ standing to decrease / reduce muscle tightness/ reinjury (02/02/2017)   Time 3   Period Weeks   Status New           PT Long Term Goals - 01/12/17 1002      PT LONG TERM GOAL #1   Title She will be I with all HEP given as of last visit (02/23/2017)   Time 6   Period Weeks   Status New     PT LONG TERM GOAL #2   Title she will increase cervical flexion/ extension by >/= 10 degrees and R sidebending/ rotation by >/= 15 degrees with </= 2/10 pain for safety during driving and ADLS ([id number])   Time 6   Period Weeks   Status New     PT LONG TERM GOAL #3   Title She will be able to return to walking for exercise with </= 3/10 pain for pt's pesonal goal  (02/23/2017)   Time 6   Period Weeks   Status New     PT LONG TERM GOAL #4   Title --   Time --   Period --   Status --               Plan - 01/12/17 [id number]    Clinical Impression Statement Mrs. Kae Heller presents to OPPT as a Moderate complexity evaluation due to involved PMHx, worsening pain and exam findings regarding CC of neck pain. She exhibits significant limitation on the cervical mobility. Signifcant soreness/ tightness in the R upper trap and levator scapule with referral to the head and neck. TPDN was explained and performed on the R upper trap followed with soft tissue work which she reproted some relief of pain and tightness but was sore. She would benefit from physical therapy to decrease muscle tightness/ pain, promote functional posture and maximize her function addressing the deficits listed.    Rehab Potential Good   PT Frequency 2x / week   PT Duration 6 weeks   PT Treatment/Interventions ADLs/Self Care Home Management;Cryotherapy;Electrical Stimulation;Iontophoresis 4mg /ml Dexamethasone;Moist Heat;Gait training;Ultrasound;Therapeutic activities;Therapeutic exercise;Patient/family education;Manual techniques;Passive range of motion;Dry needling;Taping;Manual lymph drainage   PT Next Visit Plan assess/ review HEP, assess response to DN, trial inhibition taping?, soft tissue work, stretching. (put FOTO score in and make goal; website was down and was unable to  get score)   PT Home Exercise Plan upper trap/ levator scapulae stretching, upper cervical rotation, chin tucks (in supine)   Consulted and Agree with Plan of Care Patient      Patient will benefit from skilled therapeutic intervention in order to improve the following deficits and impairments:  Pain, Decreased range of motion, Hypomobility, Decreased mobility, Decreased endurance, Decreased activity tolerance, Decreased balance, Increased fascial restricitons, Increased muscle spasms, Improper body mechanics,  Postural dysfunction  Visit Diagnosis: Cervicalgia - Plan: PT plan of care cert/re-cert  Cramp and spasm - Plan: PT plan of care cert/re-cert  Abnormal posture - Plan: PT plan of care cert/re-cert      G-Codes - [id number]    Functional Assessment Tool Used (Outpatient Only) clinical judgement   Functional Limitation Changing and maintaining body position   Changing and Maintaining Body Position Current Status [id number]) At least 60 percent but less than 80 percent impaired, limited or restricted   Changing and Maintaining Body Position Goal Status [id number]) At least 20 percent but less than 40 percent impaired, limited or restricted       Problem List Patient Active Problem List   Diagnosis Date Noted  . Neck pain 12/12/2016  . Depression with anxiety 10/17/2016  . Bladder cancer ([place]) 10/17/2016  . OA (osteoarthritis) of hip 07/07/2016  . Vaginitis and vulvovaginitis 01/18/2016  . Preventative health care 01/18/2016  . Low back pain 01/18/2016  . History of chicken pox   . Hyperlipidemia 12/15/2014  . Allergic rhinitis 06/25/2014  . Allergy to bee sting 06/10/2014  . TMJ tenderness 11/16/2013  . Other malaise and fatigue 05/07/2013  . Trapezius muscle spasm 11/27/2012  . Parkinson's disease ([place]) 05/24/2012  . HTN (hypertension) 08/31/2011  . Arthritis 08/31/2011  . Benign hypertensive heart disease without heart failure 05/12/2011    Starr Lake PT, DPT, LAT, ATC  01/12/17  10:12 AM      [facility] [address] [place], [name], [address] Phone: [phone]   Fax:  [phone]  Name: ALIYAAH AHLIN MRN: [id number] Date of Birth: Jun 17, 1932

## 2017-01-13 ENCOUNTER — Other Ambulatory Visit: Payer: Self-pay | Admitting: Family Medicine

## 2017-01-13 ENCOUNTER — Encounter: Payer: Self-pay | Admitting: Family Medicine

## 2017-01-13 NOTE — Telephone Encounter (Signed)
Printed prescription/contract/UDS Patient informed can pickup hardcopy after doing contract/uds.

## 2017-01-13 NOTE — Telephone Encounter (Signed)
Ok to refill but have her do a Soil scientist and a UDS

## 2017-01-13 NOTE — Telephone Encounter (Signed)
Requesting:   diazepam Contract   None UDS   None Last OV   01/04/2017---next scheduled office visit is 04/04/2017 Last Refill    #60 with 1 refill on 09/21/2016  Please Advise

## 2017-01-14 DIAGNOSIS — Z79899 Other long term (current) drug therapy: Secondary | ICD-10-CM | POA: Diagnosis not present

## 2017-01-18 ENCOUNTER — Ambulatory Visit: Payer: Medicare Other | Admitting: Physical Therapy

## 2017-01-18 ENCOUNTER — Ambulatory Visit: Payer: Medicare Other | Attending: Family Medicine | Admitting: Physical Therapy

## 2017-01-18 DIAGNOSIS — M4312 Spondylolisthesis, cervical region: Secondary | ICD-10-CM | POA: Diagnosis not present

## 2017-01-18 DIAGNOSIS — M50322 Other cervical disc degeneration at C5-C6 level: Secondary | ICD-10-CM | POA: Diagnosis not present

## 2017-01-18 DIAGNOSIS — M5412 Radiculopathy, cervical region: Secondary | ICD-10-CM | POA: Diagnosis not present

## 2017-01-18 DIAGNOSIS — M50321 Other cervical disc degeneration at C4-C5 level: Secondary | ICD-10-CM | POA: Diagnosis not present

## 2017-01-18 DIAGNOSIS — R293 Abnormal posture: Secondary | ICD-10-CM | POA: Insufficient documentation

## 2017-01-18 DIAGNOSIS — R252 Cramp and spasm: Secondary | ICD-10-CM | POA: Diagnosis not present

## 2017-01-18 DIAGNOSIS — M542 Cervicalgia: Secondary | ICD-10-CM | POA: Diagnosis not present

## 2017-01-18 DIAGNOSIS — M50323 Other cervical disc degeneration at C6-C7 level: Secondary | ICD-10-CM | POA: Diagnosis not present

## 2017-01-18 NOTE — Therapy (Signed)
[place], [place], [address] Phone: [phone]   Fax:  [phone]  Physical Therapy Treatment  Patient Details  Name: Stephanie [name] MRN: [id number] Date of Birth: 20-Dec-1931 Referring Provider: Gwyneth Revels MD  Encounter Date: 01/18/2017      PT End of Session - 01/18/17 0845    Visit Number 2   Number of Visits 13   Date for PT Re-Evaluation 02/23/17   Authorization Type Medicare: Kx Mod by 15th visit, progress note by 10th visit   PT Start Time 0800   PT Stop Time 0856   PT Time Calculation (min) 56 min      Past Medical History:  Diagnosis Date  . Benign hypertension without congestive heart failure   . Benign mole    right hcets mole, bleeds when dried with towel  . Bilateral dry eyes   . Bladder cancer ([place])   . Bladder cancer ([place]) 10/17/2016  . Cervical spondylosis   . Cervicalgia   . Depression with anxiety 10/17/2016  . Gross hematuria   . History of kidney stones    1970's  . Lumbar stenosis   . OA (osteoarthritis)   . Parkinson disease [facility]) dx 2012   neurologist-  dr siddiqui at [facility]    Past Surgical History:  Procedure Laterality Date  . BIOPSY MASS LEFT FIRST METACARPAL   06/23/2006   benign  . CATARACT EXTRACTION W/ INTRAOCULAR LENS  IMPLANT, BILATERAL  1999 and 2003  . CYSTOSCOPY W/ RETROGRADES Bilateral 09/22/2016   Procedure: CYSTOSCOPY WITH RETROGRADE PYELOGRAM;  Surgeon: Alexis Frock, MD;  Location: [facility];  Service: Urology;  Laterality: Bilateral;  . D & C HYSTEROSCOPY W/ RESECTION POLYP  07/11/2000  . DILATION AND CURETTAGE OF UTERUS  1960's  . EXCISION CYST AND DEBRIDEMENT RIGHT WIRST AND REMOVAL [place] BODY  01/09/2009  . LAPAROSCOPY  yrs ago   infertility   . METACARPOPHALANGEAL JOINT ARTHRODESIS Right 05/25/1996  . TONSILLECTOMY AND ADENOIDECTOMY  child  . TOTAL HIP ARTHROPLASTY Left 07/07/2016   Procedure: LEFT TOTAL HIP  ARTHROPLASTY ANTERIOR APPROACH;  Surgeon: Gaynelle Arabian, MD;  Location: [facility];  Service: Orthopedics;  Laterality: Left;  . TRANSURETHRAL RESECTION OF BLADDER TUMOR N/A 09/22/2016   Procedure: TRANSURETHRAL RESECTION OF BLADDER TUMOR (TURBT);  Surgeon: Alexis Frock, MD;  Location: [facility];  Service: Urology;  Laterality: N/A;    There were no vitals filed for this visit.      Subjective Assessment - 01/18/17 0754    Subjective "I think the DN helped, but I was pretty sore"    Currently in Pain? Yes   Pain Score 6    Pain Location Neck   Pain Orientation Right   Pain Descriptors / Indicators Shooting;Aching;Tightness   Pain Onset More than a month ago   Pain Frequency Constant   Aggravating Factors  moving the head around, muscle relaxer   Pain Relieving Factors not moving/ resting                         [facility] Adult PT Treatment/Exercise - 01/18/17 0001      Neck Exercises: Seated   Neck Retraction 5 secs;10 reps  verbal cues for form   Other Seated Exercise thoracic rotation 2 x 10 with arms crossed     Moist Heat Therapy   Number Minutes Moist Heat 10 Minutes   Moist Heat Location Shoulder  Manual Therapy   Manual Therapy Soft tissue mobilization;Taping;Joint mobilization   Joint Mobilization grade 3 PA T1-T6 mobs   Soft tissue mobilization IASTM over R upper trap, levator scapulae,    [place] L upper trap inhibition taping      Neck Exercises: Stretches   Upper Trapezius Stretch 2 reps;30 seconds   Levator Stretch 2 reps;30 seconds          Trigger Point Dry Needling - 01/18/17 0847    Consent Given? Yes   Education Handout Provided Yes   Muscles Treated Upper Body Levator scapulae;Longissimus   Upper Trapezius Response Twitch reponse elicited;Palpable increased muscle length   Levator Scapulae Response Twitch response elicited;Palpable increased muscle length   Longissimus Response Twitch response elicited;Palpable  increased muscle length  R C3 multifidus              PT Education - 01/18/17 0845    Education provided Yes   Education Details benefits of inhibition taping, length of wear and to take it off it bothers her.    Person(s) Educated Patient   Methods Explanation;Verbal cues   Comprehension Verbalized understanding;Verbal cues required          PT Short Term Goals - 01/12/17 1000      PT SHORT TERM GOAL #1   Title independent with initial HEP (3/21/20180   Time 3   Period Weeks   Status New     PT SHORT TERM GOAL #2   Title demonstrate decreased tightness / spasm in the  R upper trap/ levator scapulae to decrease pain to </=5/10 with movement and promote cervical mobility (02/02/2017)   Time 3   Period Weeks   Status New     PT SHORT TERM GOAL #3   Title she will be able to verbalize and demo proper posture with sitting/ standing to decrease / reduce muscle tightness/ reinjury (02/02/2017)   Time 3   Period Weeks   Status New           PT Long Term Goals - 01/12/17 1002      PT LONG TERM GOAL #1   Title She will be I with all HEP given as of last visit (02/23/2017)   Time 6   Period Weeks   Status New     PT LONG TERM GOAL #2   Title she will increase cervical flexion/ extension by >/= 10 degrees and R sidebending/ rotation by >/= 15 degrees with </= 2/10 pain for safety during driving and ADLS ([id number])   Time 6   Period Weeks   Status New     PT LONG TERM GOAL #3   Title She will be able to return to walking for exercise with </= 3/10 pain for pt's pesonal goal (02/23/2017)   Time 6   Period Weeks   Status New     PT LONG TERM GOAL #4   Title --   Time --   Period --   Status --               Plan - 01/18/17 0845    Clinical Impression Statement Stephanie [name] reports some relief of tightness since last session. continued DN of the R upper trap, levator scapulae and Cervical multifudus at C3. soft tissue work was performed following to  calm down sorness and thoracic mobs. She reported decreased tightness following todays treatment.    PT Next Visit Plan assess response to DN, trial inhibition taping, soft tissue work, stretching,  thoracic mobility    Consulted and Agree with Plan of Care Patient      Patient will benefit from skilled therapeutic intervention in order to improve the following deficits and impairments:     Visit Diagnosis: Cervicalgia  Cramp and spasm  Abnormal posture     Problem List Patient Active Problem List   Diagnosis Date Noted  . Neck pain 12/12/2016  . Depression with anxiety 10/17/2016  . Bladder cancer ([place]) 10/17/2016  . OA (osteoarthritis) of hip 07/07/2016  . Vaginitis and vulvovaginitis 01/18/2016  . Preventative health care 01/18/2016  . Low back pain 01/18/2016  . History of chicken pox   . Hyperlipidemia 12/15/2014  . Allergic rhinitis 06/25/2014  . Allergy to bee sting 06/10/2014  . TMJ tenderness 11/16/2013  . Other malaise and fatigue 05/07/2013  . Trapezius muscle spasm 11/27/2012  . Parkinson's disease ([place]) 05/24/2012  . HTN (hypertension) 08/31/2011  . Arthritis 08/31/2011  . Benign hypertensive heart disease without heart failure 05/12/2011   Starr Lake PT, DPT, LAT, ATC  01/18/17  9:43 AM      [facility] [address] [place], [place], [address] Phone: [phone]   Fax:  [phone]  Name: Stephanie [name] MRN: [id number] Date of Birth: 1931-12-28

## 2017-01-19 ENCOUNTER — Telehealth: Payer: Self-pay | Admitting: Family Medicine

## 2017-01-19 ENCOUNTER — Other Ambulatory Visit: Payer: Self-pay | Admitting: Family Medicine

## 2017-01-19 DIAGNOSIS — M542 Cervicalgia: Secondary | ICD-10-CM

## 2017-01-19 NOTE — Telephone Encounter (Signed)
Please advise 

## 2017-01-19 NOTE — Telephone Encounter (Signed)
Caller name: Relationship to patient: Self Can be reached: [phone] Pharmacy:  Reason for call: Patient states that NP she was referred to wants her to go to Adult PT. Needs referral. Stopped her from doing dry needling.

## 2017-01-21 ENCOUNTER — Ambulatory Visit: Payer: Medicare Other | Admitting: Physical Therapy

## 2017-01-21 NOTE — Telephone Encounter (Signed)
Patient called back to follow up on previous note. States she is in pain and needs to get something done soon.

## 2017-01-22 ENCOUNTER — Encounter: Payer: Self-pay | Admitting: Family Medicine

## 2017-01-22 ENCOUNTER — Other Ambulatory Visit: Payer: Self-pay | Admitting: Obstetrics & Gynecology

## 2017-01-23 NOTE — Telephone Encounter (Signed)
I have placed this referral and I know I have answered this at least once before, where does this get lost?

## 2017-01-24 ENCOUNTER — Encounter: Payer: Self-pay | Admitting: Physical Therapy

## 2017-01-24 ENCOUNTER — Ambulatory Visit: Payer: Medicare Other | Admitting: Physical Therapy

## 2017-01-24 DIAGNOSIS — M542 Cervicalgia: Secondary | ICD-10-CM

## 2017-01-24 DIAGNOSIS — R293 Abnormal posture: Secondary | ICD-10-CM | POA: Diagnosis not present

## 2017-01-24 DIAGNOSIS — R252 Cramp and spasm: Secondary | ICD-10-CM

## 2017-01-24 NOTE — Telephone Encounter (Signed)
Patient wants this at [facility]

## 2017-01-24 NOTE — Telephone Encounter (Signed)
This has been changed 

## 2017-01-24 NOTE — Telephone Encounter (Signed)
Medication refill request: Medroxyprogesterone Last AEX:  10/28/15 SM Next AEX: 02/18/17 SM Last MMG (if hormonal medication request): 03/08/16 BIRADS2, Density C, [facility] Refill authorized: 02/03/16 #45 3R. Please advise. Thank you.

## 2017-01-24 NOTE — Therapy (Signed)
[place], [place], [address] Phone: [phone]   Fax:  [phone]  Physical Therapy Treatment  Patient Details  Name: Stephanie [name] MRN: [id number] Date of Birth: [birth year] Referring Provider: Gwyneth Revels MD  Encounter Date: 01/24/2017      PT End of Session - 01/24/17 1130    Visit Number 3   Number of Visits 13   Date for PT Re-Evaluation 02/23/17   PT Start Time 1105   PT Stop Time 1200   PT Time Calculation (min) 55 min   Activity Tolerance Patient limited by pain   Behavior During Therapy Anxious      Past Medical History:  Diagnosis Date  . Benign hypertension without congestive heart failure   . Benign mole    right hcets mole, bleeds when dried with towel  . Bilateral dry eyes   . Bladder cancer ([place])   . Bladder cancer ([place]) 10/17/2016  . Cervical spondylosis   . Cervicalgia   . Depression with anxiety 10/17/2016  . Gross hematuria   . History of kidney stones    1970's  . Lumbar stenosis   . OA (osteoarthritis)   . Parkinson disease [facility]) dx 2012   neurologist-  dr siddiqui at [facility]    Past Surgical History:  Procedure Laterality Date  . BIOPSY MASS LEFT FIRST METACARPAL   06/23/2006   benign  . CATARACT EXTRACTION W/ INTRAOCULAR LENS  IMPLANT, BILATERAL  1999 and 2003  . CYSTOSCOPY W/ RETROGRADES Bilateral 09/22/2016   Procedure: CYSTOSCOPY WITH RETROGRADE PYELOGRAM;  Surgeon: Alexis Frock, MD;  Location: [facility];  Service: Urology;  Laterality: Bilateral;  . D & C HYSTEROSCOPY W/ RESECTION POLYP  07/11/2000  . DILATION AND CURETTAGE OF UTERUS  1960's  . EXCISION CYST AND DEBRIDEMENT RIGHT WIRST AND REMOVAL [place] BODY  01/09/2009  . LAPAROSCOPY  yrs ago   infertility   . METACARPOPHALANGEAL JOINT ARTHRODESIS Right 05/25/1996  . TONSILLECTOMY AND ADENOIDECTOMY  child  . TOTAL HIP ARTHROPLASTY Left 07/07/2016   Procedure: LEFT  TOTAL HIP ARTHROPLASTY ANTERIOR APPROACH;  Surgeon: Gaynelle Arabian, MD;  Location: [facility];  Service: Orthopedics;  Laterality: Left;  . TRANSURETHRAL RESECTION OF BLADDER TUMOR N/A 09/22/2016   Procedure: TRANSURETHRAL RESECTION OF BLADDER TUMOR (TURBT);  Surgeon: Alexis Frock, MD;  Location: [facility];  Service: Urology;  Laterality: N/A;    There were no vitals filed for this visit.      Subjective Assessment - 01/24/17 1108    Subjective Patient reports that she was having significant increase in neck pain for the next few days, she reports that she cancelled the appointments that she had a [address] and elected to come to our office due to living close by here.   Currently in Pain? Yes   Pain Score 9    Pain Location Neck   Pain Orientation Right;Upper   Aggravating Factors  movments pain up to 9/10                         [facility] Adult PT Treatment/Exercise - 01/24/17 0001      Modalities   Modalities Electrical Stimulation;Moist Heat;Traction     Moist Heat Therapy   Number Minutes Moist Heat 15 Minutes   Moist Heat Location Cervical;Shoulder     Electrical Stimulation   Electrical Stimulation Location C/T area   Electrical Stimulation Action IFC  Electrical Stimulation Parameters supine   Electrical Stimulation Goals Pain     Traction   Type of Traction Cervical   Min (lbs) 14   Hold Time static   Time 15     Manual Therapy   Soft tissue mobilization STM to the upper traps, the rhomboids and the cervical parapsinals                  PT Short Term Goals - 01/12/17 1000      PT SHORT TERM GOAL #1   Title independent with initial HEP (3/21/20180   Time 3   Period Weeks   Status New     PT SHORT TERM GOAL #2   Title demonstrate decreased tightness / spasm in the  R upper trap/ levator scapulae to decrease pain to </=5/10 with movement and promote cervical mobility (02/02/2017)   Time 3   Period Weeks   Status New      PT SHORT TERM GOAL #3   Title she will be able to verbalize and demo proper posture with sitting/ standing to decrease / reduce muscle tightness/ reinjury (02/02/2017)   Time 3   Period Weeks   Status New           PT Long Term Goals - 01/12/17 1002      PT LONG TERM GOAL #1   Title She will be I with all HEP given as of last visit (02/23/2017)   Time 6   Period Weeks   Status New     PT LONG TERM GOAL #2   Title she will increase cervical flexion/ extension by >/= 10 degrees and R sidebending/ rotation by >/= 15 degrees with </= 2/10 pain for safety during driving and ADLS (7/59/1638)   Time 6   Period Weeks   Status New     PT LONG TERM GOAL #3   Title She will be able to return to walking for exercise with </= 3/10 pain for pt's pesonal goal (02/23/2017)   Time 6   Period Weeks   Status New     PT LONG TERM GOAL #4   Title --   Time --   Period --   Status --               Plan - 01/24/17 1130    Clinical Impression Statement Pateint very tender and gaurded to any touch today, she would jump at times and report a shooting pain.  She reported that she felt really tight with the traction.  She had significant spasms in the upper trap and the cervical paraspinals..  She reports that she had a hip replaced last August and during her recovery her husband got sick and passed away in 10-05-2023, she was diagnosed with bladder cancer, had surgery and chemo in November and December repectively, she has been under a lot of stress.   PT Next Visit Plan assess traction, possibly start exercises to get her moving.   Consulted and Agree with Plan of Care Patient      Patient will benefit from skilled therapeutic intervention in order to improve the following deficits and impairments:  Pain, Decreased range of motion, Hypomobility, Decreased mobility, Decreased endurance, Decreased activity tolerance, Decreased balance, Increased fascial restricitons, Increased muscle spasms,  Improper body mechanics, Postural dysfunction  Visit Diagnosis: Cervicalgia  Cramp and spasm  Abnormal posture     Problem List Patient Active Problem List   Diagnosis Date Noted  . Neck pain  12/12/2016  . Depression with anxiety 10/17/2016  . Bladder cancer ([place]) 10/17/2016  . OA (osteoarthritis) of hip 07/07/2016  . Vaginitis and vulvovaginitis 01/18/2016  . Preventative health care 01/18/2016  . Low back pain 01/18/2016  . History of chicken pox   . Hyperlipidemia 12/15/2014  . Allergic rhinitis 06/25/2014  . Allergy to bee sting 06/10/2014  . TMJ tenderness 11/16/2013  . Other malaise and fatigue 05/07/2013  . Trapezius muscle spasm 11/27/2012  . Parkinson's disease ([place]) 05/24/2012  . HTN (hypertension) 08/31/2011  . Arthritis 08/31/2011  . Benign hypertensive heart disease without heart failure 05/12/2011    Sumner Boast., PT 01/24/2017, 11:48 AM  [facility] [place], [place], [address] Phone: [phone]   Fax:  [phone]  Name: Stephanie [name] MRN: [id number] Date of Birth: 1932/02/26

## 2017-01-26 ENCOUNTER — Ambulatory Visit: Payer: Medicare Other | Admitting: Physical Therapy

## 2017-01-26 ENCOUNTER — Encounter: Payer: Self-pay | Admitting: Physical Therapy

## 2017-01-26 DIAGNOSIS — R252 Cramp and spasm: Secondary | ICD-10-CM

## 2017-01-26 DIAGNOSIS — M542 Cervicalgia: Secondary | ICD-10-CM

## 2017-01-26 DIAGNOSIS — R293 Abnormal posture: Secondary | ICD-10-CM

## 2017-01-26 NOTE — Therapy (Signed)
[place], [place], [address] Phone: [phone]   Fax:  [phone]  Physical Therapy Treatment  Patient Details  Name: Stephanie [name] MRN: [id number] Date of Birth: March 04, 1932 Referring Provider: Gwyneth Revels MD  Encounter Date: 01/26/2017      PT End of Session - 01/26/17 0959    Visit Number 4   Date for PT Re-Evaluation 02/23/17   PT Start Time 0916   PT Stop Time 1020   PT Time Calculation (min) 64 min   Activity Tolerance Patient limited by pain   Behavior During Therapy [facility] for tasks assessed/performed      Past Medical History:  Diagnosis Date  . Benign hypertension without congestive heart failure   . Benign mole    right hcets mole, bleeds when dried with towel  . Bilateral dry eyes   . Bladder cancer ([place])   . Bladder cancer ([place]) 10/17/2016  . Cervical spondylosis   . Cervicalgia   . Depression with anxiety 10/17/2016  . Gross hematuria   . History of kidney stones    1970's  . Lumbar stenosis   . OA (osteoarthritis)   . Parkinson disease [facility]) dx 2012   neurologist-  dr siddiqui at [facility]    Past Surgical History:  Procedure Laterality Date  . BIOPSY MASS LEFT FIRST METACARPAL   06/23/2006   benign  . CATARACT EXTRACTION W/ INTRAOCULAR LENS  IMPLANT, BILATERAL  1999 and 2003  . CYSTOSCOPY W/ RETROGRADES Bilateral 09/22/2016   Procedure: CYSTOSCOPY WITH RETROGRADE PYELOGRAM;  Surgeon: Alexis Frock, MD;  Location: [facility];  Service: Urology;  Laterality: Bilateral;  . D & C HYSTEROSCOPY W/ RESECTION POLYP  07/11/2000  . DILATION AND CURETTAGE OF UTERUS  1960's  . EXCISION CYST AND DEBRIDEMENT RIGHT WIRST AND REMOVAL [place] BODY  01/09/2009  . LAPAROSCOPY  yrs ago   infertility   . METACARPOPHALANGEAL JOINT ARTHRODESIS Right 05/25/1996  . TONSILLECTOMY AND ADENOIDECTOMY  child  . TOTAL HIP ARTHROPLASTY Left 07/07/2016   Procedure: LEFT  TOTAL HIP ARTHROPLASTY ANTERIOR APPROACH;  Surgeon: Gaynelle Arabian, MD;  [facility];  Service: Orthopedics;  Laterality: Left;  . TRANSURETHRAL RESECTION OF BLADDER TUMOR N/A 09/22/2016   Procedure: TRANSURETHRAL RESECTION OF BLADDER TUMOR (TURBT);  Surgeon: Alexis Frock, MD;  Location: [facility];  Service: Urology;  Laterality: N/A;    There were no vitals filed for this visit.      Subjective Assessment - 01/26/17 0956    Subjective Patient reports that she was sore after the last treatment.     Currently in Pain? Yes   Pain Score 8    Pain Location Neck   Pain Orientation Right;Upper                         [facility] Adult PT Treatment/Exercise - 01/26/17 0001      Neck Exercises: Seated   Neck Retraction 10 reps;3 secs   Cervical Rotation 5 reps;Both   Shoulder Shrugs 20 reps   Shoulder Shrugs Limitations 3# with upper trap and leveator stretches   Shoulder Rolls 10 reps   Other Seated Exercise red tband scapular stabilization exercises     Moist Heat Therapy   Number Minutes Moist Heat 15 Minutes   Moist Heat Location Cervical;Shoulder     Electrical Stimulation   Electrical Stimulation Location C/T area   Electrical Stimulation Action IFC  Electrical Stimulation Parameters supine   Electrical Stimulation Goals Pain     Traction   Type of Traction Cervical   Min (lbs) 14   Hold Time static   Time 15     Manual Therapy   Soft tissue mobilization STM to the upper traps, the rhomboids and the cervical parapsinals   Mulligan gentle cervcial isometrics rotation, extension and side bending to work on what I feel may be a facet                  PT Short Term Goals - 01/26/17 1005      PT SHORT TERM GOAL #1   Title independent with initial HEP (3/21/20180   Status Achieved           PT Long Term Goals - 01/12/17 1002      PT LONG TERM GOAL #1   Title She will be I with all HEP given as of last visit (02/23/2017)    Time 6   Period Weeks   Status New     PT LONG TERM GOAL #2   Title she will increase cervical flexion/ extension by >/= 10 degrees and R sidebending/ rotation by >/= 15 degrees with </= 2/10 pain for safety during driving and ADLS (5/95/6387)   Time 6   Period Weeks   Status New     PT LONG TERM GOAL #3   Title She will be able to return to walking for exercise with </= 3/10 pain for pt's pesonal goal (02/23/2017)   Time 6   Period Weeks   Status New     PT LONG TERM GOAL #4   Title --   Time --   Period --   Status --               Plan - 01/26/17 1002    Clinical Impression Statement Patient with pain all right cervical area and mastoid area, very tender here and very limited in her ROM.  Seems she may have a stuck facet due to head position and the difficulty with rotation and side bending   PT Next Visit Plan see if the treatment today offered her any relief, still very painful, needs gentle isometrics due to the high level of pain   Consulted and Agree with Plan of Care Patient      Patient will benefit from skilled therapeutic intervention in order to improve the following deficits and impairments:  Pain, Decreased range of motion, Hypomobility, Decreased mobility, Decreased endurance, Decreased activity tolerance, Decreased balance, Increased fascial restricitons, Increased muscle spasms, Improper body mechanics, Postural dysfunction  Visit Diagnosis: Cervicalgia  Cramp and spasm  Abnormal posture     Problem List Patient Active Problem List   Diagnosis Date Noted  . Neck pain 12/12/2016  . Depression with anxiety 10/17/2016  . Bladder cancer ([place]) 10/17/2016  . OA (osteoarthritis) of hip 07/07/2016  . Vaginitis and vulvovaginitis 01/18/2016  . Preventative health care 01/18/2016  . Low back pain 01/18/2016  . History of chicken pox   . Hyperlipidemia 12/15/2014  . Allergic rhinitis 06/25/2014  . Allergy to bee sting 06/10/2014  . TMJ  tenderness 11/16/2013  . Other malaise and fatigue 05/07/2013  . Trapezius muscle spasm 11/27/2012  . Parkinson's disease ([place]) 05/24/2012  . HTN (hypertension) 08/31/2011  . Arthritis 08/31/2011  . Benign hypertensive heart disease without heart failure 05/12/2011    Sumner Boast., PT 01/26/2017, 10:06 AM  [place], [place], [address] Phone: [phone]   Fax:  [phone]  Name: Stephanie [name] MRN: [id number] Date of Birth: 14-Apr-1932

## 2017-01-26 NOTE — Telephone Encounter (Signed)
Patient's pharmacy called to check on the status of the refill request.

## 2017-01-31 ENCOUNTER — Other Ambulatory Visit: Payer: Self-pay | Admitting: Family Medicine

## 2017-01-31 ENCOUNTER — Ambulatory Visit: Payer: Medicare Other | Admitting: Physical Therapy

## 2017-01-31 ENCOUNTER — Encounter: Payer: Self-pay | Admitting: Physical Therapy

## 2017-01-31 DIAGNOSIS — R252 Cramp and spasm: Secondary | ICD-10-CM

## 2017-01-31 DIAGNOSIS — R293 Abnormal posture: Secondary | ICD-10-CM

## 2017-01-31 DIAGNOSIS — M542 Cervicalgia: Secondary | ICD-10-CM | POA: Diagnosis not present

## 2017-01-31 NOTE — Therapy (Signed)
[place], [place], [address] Phone: [phone]   Fax:  [phone]  Physical Therapy Treatment  Patient Details  Name: Stephanie [name] MRN: [id number] Date of Birth: 1932-06-25 Referring Provider: Gwyneth Revels MD  Encounter Date: 01/31/2017      PT End of Session - 01/31/17 1153    Visit Number 5   Date for PT Re-Evaluation 02/23/17   PT Start Time 0920   PT Stop Time 1022   PT Time Calculation (min) 62 min   Activity Tolerance Patient limited by pain   Behavior During Therapy [facility] for tasks assessed/performed      Past Medical History:  Diagnosis Date  . Benign hypertension without congestive heart failure   . Benign mole    right hcets mole, bleeds when dried with towel  . Bilateral dry eyes   . Bladder cancer ([place])   . Bladder cancer ([place]) 10/17/2016  . Cervical spondylosis   . Cervicalgia   . Depression with anxiety 10/17/2016  . Gross hematuria   . History of kidney stones    1970's  . Lumbar stenosis   . OA (osteoarthritis)   . Parkinson disease [facility]) dx 2012   neurologist-  dr siddiqui at [facility]    Past Surgical History:  Procedure Laterality Date  . BIOPSY MASS LEFT FIRST METACARPAL   06/23/2006   benign  . CATARACT EXTRACTION W/ INTRAOCULAR LENS  IMPLANT, BILATERAL  1999 and 2003  . CYSTOSCOPY W/ RETROGRADES Bilateral 09/22/2016   Procedure: CYSTOSCOPY WITH RETROGRADE PYELOGRAM;  Surgeon: Alexis Frock, MD;  Location: [facility];  Service: Urology;  Laterality: Bilateral;  . D & C HYSTEROSCOPY W/ RESECTION POLYP  07/11/2000  . DILATION AND CURETTAGE OF UTERUS  1960's  . EXCISION CYST AND DEBRIDEMENT RIGHT WIRST AND REMOVAL [place] BODY  01/09/2009  . LAPAROSCOPY  yrs ago   infertility   . METACARPOPHALANGEAL JOINT ARTHRODESIS Right 05/25/1996  . TONSILLECTOMY AND ADENOIDECTOMY  child  . TOTAL HIP ARTHROPLASTY Left 07/07/2016   Procedure: LEFT  TOTAL HIP ARTHROPLASTY ANTERIOR APPROACH;  Surgeon: Gaynelle Arabian, MD;  [facility];  Service: Orthopedics;  Laterality: Left;  . TRANSURETHRAL RESECTION OF BLADDER TUMOR N/A 09/22/2016   Procedure: TRANSURETHRAL RESECTION OF BLADDER TUMOR (TURBT);  Surgeon: Alexis Frock, MD;  Location: [facility];  Service: Urology;  Laterality: N/A;    There were no vitals filed for this visit.      Subjective Assessment - 01/31/17 1149    Subjective Patient reports that she got a day to a day and a half relief after last treatment, reports much more pain on Saturday.   Currently in Pain? Yes   Pain Score 7    Pain Location Neck   Pain Orientation Right   Aggravating Factors  turning head                         [facility] Adult PT Treatment/Exercise - 01/31/17 0001      Modalities   Modalities Ultrasound     Moist Heat Therapy   Number Minutes Moist Heat 15 Minutes   Moist Heat Location Cervical;Shoulder     Electrical Stimulation   Electrical Stimulation Location C/T area   Electrical Stimulation Action IFC   Electrical Stimulation Parameters supine   Electrical Stimulation Goals Pain     Ultrasound   Ultrasound Location to the right cervical area and  into the right SCM   Ultrasound Parameters 100% 1.5 w/cm2   Ultrasound Goals Pain     Traction   Type of Traction Cervical   Min (lbs) 15   Hold Time static   Time 15     Manual Therapy   Soft tissue mobilization STM to the upper traps, the rhomboids and the cervical parapsinals, use of some vibration   Mulligan gentle cervcial isometrics rotation, extension and side bending to work on what I feel may be a facet                  PT Short Term Goals - 01/31/17 1155      PT SHORT TERM GOAL #2   Title demonstrate decreased tightness / spasm in the  R upper trap/ levator scapulae to decrease pain to </=5/10 with movement and promote cervical mobility (02/02/2017)   Status On-going     PT  SHORT TERM GOAL #3   Title she will be able to verbalize and demo proper posture with sitting/ standing to decrease / reduce muscle tightness/ reinjury (02/02/2017)   Status On-going           PT Long Term Goals - 01/12/17 1002      PT LONG TERM GOAL #1   Title She will be I with all HEP given as of last visit (02/23/2017)   Time 6   Period Weeks   Status New     PT LONG TERM GOAL #2   Title she will increase cervical flexion/ extension by >/= 10 degrees and R sidebending/ rotation by >/= 15 degrees with </= 2/10 pain for safety during driving and ADLS (1/61/0960)   Time 6   Period Weeks   Status New     PT LONG TERM GOAL #3   Title She will be able to return to walking for exercise with </= 3/10 pain for pt's pesonal goal (02/23/2017)   Time 6   Period Weeks   Status New     PT LONG TERM GOAL #4   Title --   Time --   Period --   Status --               Plan - 01/31/17 1154    Clinical Impression Statement reports that she had good relief after the last visit, reports that came came back pretty severe on Saturday, worse with turning to the right, she is tolerating touch and passive head rotation better but still very gaurded   PT Next Visit Plan see if the treatment today offered her any relief, still very painful, needs gentle isometrics due to the high level of pain   Consulted and Agree with Plan of Care Patient      Patient will benefit from skilled therapeutic intervention in order to improve the following deficits and impairments:  Pain, Decreased range of motion, Hypomobility, Decreased mobility, Decreased endurance, Decreased activity tolerance, Decreased balance, Increased fascial restricitons, Increased muscle spasms, Improper body mechanics, Postural dysfunction  Visit Diagnosis: Cervicalgia  Cramp and spasm  Abnormal posture     Problem List Patient Active Problem List   Diagnosis Date Noted  . Neck pain 12/12/2016  . Depression with  anxiety 10/17/2016  . Bladder cancer ([place]) 10/17/2016  . OA (osteoarthritis) of hip 07/07/2016  . Vaginitis and vulvovaginitis 01/18/2016  . Preventative health care 01/18/2016  . Low back pain 01/18/2016  . History of chicken pox   . Hyperlipidemia 12/15/2014  . Allergic rhinitis  06/25/2014  . Allergy to bee sting 06/10/2014  . TMJ tenderness 11/16/2013  . Other malaise and fatigue 05/07/2013  . Trapezius muscle spasm 11/27/2012  . Parkinson's disease ([place]) 05/24/2012  . HTN (hypertension) 08/31/2011  . Arthritis 08/31/2011  . Benign hypertensive heart disease without heart failure 05/12/2011    Sumner Boast., PT 01/31/2017, 11:56 AM  [place], [place], [address] Phone: [phone]   Fax:  [phone]  Name: KANITRA PURIFOY MRN: [id number] Date of Birth: [birth year]

## 2017-02-02 ENCOUNTER — Ambulatory Visit: Payer: Medicare Other | Admitting: Physical Therapy

## 2017-02-02 ENCOUNTER — Encounter: Payer: BLUE CROSS/BLUE SHIELD | Admitting: Physical Therapy

## 2017-02-02 ENCOUNTER — Ambulatory Visit: Payer: Medicare Other | Admitting: Rehabilitation

## 2017-02-02 ENCOUNTER — Encounter: Payer: Self-pay | Admitting: Rehabilitation

## 2017-02-02 DIAGNOSIS — R293 Abnormal posture: Secondary | ICD-10-CM | POA: Diagnosis not present

## 2017-02-02 DIAGNOSIS — R252 Cramp and spasm: Secondary | ICD-10-CM | POA: Diagnosis not present

## 2017-02-02 DIAGNOSIS — M542 Cervicalgia: Secondary | ICD-10-CM | POA: Diagnosis not present

## 2017-02-02 NOTE — Therapy (Signed)
[place] [address], [place], [address] Phone: [phone]   Fax:  [phone]  Physical Therapy Treatment  Patient Details  Name: Stephanie [name] MRN: [id number] Date of Birth: [birth year] Referring Provider: Gwyneth Revels MD  Encounter Date: 02/02/2017      PT End of Session - 02/02/17 1137    Visit Number 6   Number of Visits 13   Date for PT Re-Evaluation 02/23/17   PT Start Time 5681   PT Stop Time 1205   PT Time Calculation (min) 80 min   Activity Tolerance Patient limited by pain      Past Medical History:  Diagnosis Date  . Benign hypertension without congestive heart failure   . Benign mole    right hcets mole, bleeds when dried with towel  . Bilateral dry eyes   . Bladder cancer ([place])   . Bladder cancer ([place]) 10/17/2016  . Cervical spondylosis   . Cervicalgia   . Depression with anxiety 10/17/2016  . Gross hematuria   . History of kidney stones    1970's  . Lumbar stenosis   . OA (osteoarthritis)   . Parkinson disease [facility]) dx 2012   neurologist-  dr siddiqui at [facility]    Past Surgical History:  Procedure Laterality Date  . BIOPSY MASS LEFT FIRST METACARPAL   06/23/2006   benign  . CATARACT EXTRACTION W/ INTRAOCULAR LENS  IMPLANT, BILATERAL  1999 and 2003  . CYSTOSCOPY W/ RETROGRADES Bilateral 09/22/2016   Procedure: CYSTOSCOPY WITH RETROGRADE PYELOGRAM;  Surgeon: Alexis Frock, MD;  Location: [facility];  Service: Urology;  Laterality: Bilateral;  . D & C HYSTEROSCOPY W/ RESECTION POLYP  07/11/2000  . DILATION AND CURETTAGE OF UTERUS  1960's  . EXCISION CYST AND DEBRIDEMENT RIGHT WIRST AND REMOVAL [place] BODY  01/09/2009  . LAPAROSCOPY  yrs ago   infertility   . METACARPOPHALANGEAL JOINT ARTHRODESIS Right 05/25/1996  . TONSILLECTOMY AND ADENOIDECTOMY  child  . TOTAL HIP ARTHROPLASTY Left 07/07/2016   Procedure: LEFT TOTAL HIP ARTHROPLASTY ANTERIOR  APPROACH;  Surgeon: Gaynelle Arabian, MD;  [facility];  Service: Orthopedics;  Laterality: Left;  . TRANSURETHRAL RESECTION OF BLADDER TUMOR N/A 09/22/2016   Procedure: TRANSURETHRAL RESECTION OF BLADDER TUMOR (TURBT);  Surgeon: Alexis Frock, MD;  Location: [facility];  Service: Urology;  Laterality: N/A;    There were no vitals filed for this visit.      Subjective Assessment - 02/02/17 1042    Subjective FElt good x 1 day after treatment and then the next day it was back to the same.     Currently in Pain? Yes   Pain Score 3    Pain Location Neck   Pain Orientation Right   Pain Descriptors / Indicators Aching;Constant   Pain Type Chronic pain   Pain Onset More than a month ago   Pain Frequency Constant   Aggravating Factors  turning the head   Pain Relieving Factors rest            [facility] PT Assessment - 02/02/17 0001      AROM   Cervical - Right Rotation 5                     [facility] Adult PT Treatment/Exercise - 02/02/17 0001      Moist Heat Therapy   Number Minutes Moist Heat 15 Minutes   Moist Heat Location Cervical  [place] IFC   Electrical Stimulation Parameters supine   Electrical Stimulation Goals Pain     Ultrasound   Ultrasound Location R cervical into SCM   Ultrasound Parameters 100% 1w/cm2   Ultrasound Goals Pain     Traction   Type of Traction Cervical   Min (lbs) 12   Hold Time static   Time 15     Manual Therapy   Manual Therapy Soft tissue mobilization;Joint mobilization   Joint Mobilization seated with forehead on table UPAs R C1-2 grade IV-- 3x20"   Soft tissue mobilization seated to R cervical paraspinals, SCM, UT, and supine traction pulls, passive rotation bil, and STM     Neck Exercises: Stretches   Other Neck Stretches given supine AROM rotation, chin tucks, and self towel roll traction for home with  performance of each                PT Education - 02/02/17 1137    Education provided Yes   Education Details HEP   Person(s) Educated Patient   Methods Explanation;Demonstration;Tactile cues;Verbal cues;Handout   Comprehension Verbalized understanding;Need further instruction          PT Short Term Goals - 01/31/17 1155      PT SHORT TERM GOAL #2   Title demonstrate decreased tightness / spasm in the  R upper trap/ levator scapulae to decrease pain to </=5/10 with movement and promote cervical mobility (02/02/2017)   Status On-going     PT SHORT TERM GOAL #3   Title she will be able to verbalize and demo proper posture with sitting/ standing to decrease / reduce muscle tightness/ reinjury (02/02/2017)   Status On-going           PT Long Term Goals - 01/12/17 1002      PT LONG TERM GOAL #1   Title She will be I with all HEP given as of last visit (02/23/2017)   Time 6   Period Weeks   Status New     PT LONG TERM GOAL #2   Title she will increase cervical flexion/ extension by >/= 10 degrees and R sidebending/ rotation by >/= 15 degrees with </= 2/10 pain for safety during driving and ADLS (9/67/8938)   Time 6   Period Weeks   Status New     PT LONG TERM GOAL #3   Title She will be able to return to walking for exercise with </= 3/10 pain for pt's pesonal goal (02/23/2017)   Time 6   Period Weeks   Status New     PT LONG TERM GOAL #4   Title --   Time --   Period --   Status --               Plan - 02/02/17 1152    Clinical Impression Statement continues to have pain relief with return the next day.  significant rotational limitation to the R but improved with gravity eliminated.  continues with guarding overall   Rehab Potential Good   PT Frequency 2x / week   PT Duration 6 weeks   PT Next Visit Plan pt interested in doing more HEP; continue ROM work/pain relief, discussed briefly home traction unit   PT Home Exercise Plan added supine AROM,  chin tucks and self towel traction last visit.     Consulted and Agree with Plan of Care Patient  Patient will benefit from skilled therapeutic intervention in order to improve the following deficits and impairments:  Pain, Decreased range of motion, Hypomobility, Decreased mobility, Decreased endurance, Decreased activity tolerance, Decreased balance, Increased fascial restricitons, Increased muscle spasms, Improper body mechanics, Postural dysfunction  Visit Diagnosis: Cervicalgia  Cramp and spasm  Abnormal posture     Problem List Patient Active Problem List   Diagnosis Date Noted  . Neck pain 12/12/2016  . Depression with anxiety 10/17/2016  . Bladder cancer ([place]) 10/17/2016  . OA (osteoarthritis) of hip 07/07/2016  . Vaginitis and vulvovaginitis 01/18/2016  . Preventative health care 01/18/2016  . Low back pain 01/18/2016  . History of chicken pox   . Hyperlipidemia 12/15/2014  . Allergic rhinitis 06/25/2014  . Allergy to bee sting 06/10/2014  . TMJ tenderness 11/16/2013  . Other malaise and fatigue 05/07/2013  . Trapezius muscle spasm 11/27/2012  . Parkinson's disease ([place]) 05/24/2012  . HTN (hypertension) 08/31/2011  . Arthritis 08/31/2011  . Benign hypertensive heart disease without heart failure 05/12/2011    Stark Bray 02/02/2017, 11:56 AM  [address] [place], [place], [address] Phone: [phone]   Fax:  [phone]  Name: Stephanie [name] MRN: [id number] Date of Birth: 1932/03/28

## 2017-02-02 NOTE — Patient Instructions (Signed)
Given supine rotation bil, supine chin tuck, and self towel roll traction

## 2017-02-06 ENCOUNTER — Other Ambulatory Visit: Payer: Self-pay | Admitting: Family Medicine

## 2017-02-07 ENCOUNTER — Encounter: Payer: BLUE CROSS/BLUE SHIELD | Admitting: Physical Therapy

## 2017-02-07 ENCOUNTER — Encounter: Payer: Self-pay | Admitting: Physical Therapy

## 2017-02-07 ENCOUNTER — Ambulatory Visit: Payer: Medicare Other | Admitting: Physical Therapy

## 2017-02-07 DIAGNOSIS — M542 Cervicalgia: Secondary | ICD-10-CM | POA: Diagnosis not present

## 2017-02-07 DIAGNOSIS — R293 Abnormal posture: Secondary | ICD-10-CM

## 2017-02-07 DIAGNOSIS — R252 Cramp and spasm: Secondary | ICD-10-CM

## 2017-02-07 NOTE — Therapy (Signed)
[place], [place], [address] Phone: [phone]   Fax:  [phone]  Physical Therapy Treatment  Patient Details  Name: Stephanie [name] MRN: [id number] Date of Birth: 09/04/1932 Referring Provider: Gwyneth Revels MD  Encounter Date: 02/07/2017      PT End of Session - 02/07/17 1002    Visit Number 7   Date for PT Re-Evaluation 02/23/17   PT Start Time 0924   PT Stop Time 1025   PT Time Calculation (min) 61 min   Activity Tolerance Patient limited by pain   Behavior During Therapy [facility] for tasks assessed/performed      Past Medical History:  Diagnosis Date  . Benign hypertension without congestive heart failure   . Benign mole    right hcets mole, bleeds when dried with towel  . Bilateral dry eyes   . Bladder cancer ([place])   . Bladder cancer ([place]) 10/17/2016  . Cervical spondylosis   . Cervicalgia   . Depression with anxiety 10/17/2016  . Gross hematuria   . History of kidney stones    1970's  . Lumbar stenosis   . OA (osteoarthritis)   . Parkinson disease [facility]) dx 2012   neurologist-  dr siddiqui at [facility]    Past Surgical History:  Procedure Laterality Date  . BIOPSY MASS LEFT FIRST METACARPAL   06/23/2006   benign  . CATARACT EXTRACTION W/ INTRAOCULAR LENS  IMPLANT, BILATERAL  1999 and 2003  . CYSTOSCOPY W/ RETROGRADES Bilateral 09/22/2016   Procedure: CYSTOSCOPY WITH RETROGRADE PYELOGRAM;  Surgeon: Alexis Frock, MD;  Location[facility];  Service: Urology;  Laterality: Bilateral;  . D & C HYSTEROSCOPY W/ RESECTION POLYP  07/11/2000  . DILATION AND CURETTAGE OF UTERUS  1960's  . EXCISION CYST AND DEBRIDEMENT RIGHT WIRST AND REMOVAL [place] BODY  01/09/2009  . LAPAROSCOPY  yrs ago   infertility   . METACARPOPHALANGEAL JOINT ARTHRODESIS Right 05/25/1996  . TONSILLECTOMY AND ADENOIDECTOMY  child  . TOTAL HIP ARTHROPLASTY Left 07/07/2016   Procedure: LEFT  TOTAL HIP ARTHROPLASTY ANTERIOR APPROACH;  Surgeon: Gaynelle Arabian, MD;  [facility];  Service: Orthopedics;  Laterality: Left;  . TRANSURETHRAL RESECTION OF BLADDER TUMOR N/A 09/22/2016   Procedure: TRANSURETHRAL RESECTION OF BLADDER TUMOR (TURBT);  Surgeon: Alexis Frock, MD;  Location: [facility];  Service: Urology;  Laterality: N/A;    There were no vitals filed for this visit.      Subjective Assessment - 02/07/17 0959    Subjective Continues to report some relief after PT.  Reports that next week she will be having a CT scan   Currently in Pain? Yes   Pain Score 3    Pain Location Neck   Pain Orientation Right   Aggravating Factors  turning head                         [facility] Adult PT Treatment/Exercise - 02/07/17 0001      Moist Heat Therapy   Number Minutes Moist Heat 15 Minutes   Moist Heat Location Cervical     Electrical Stimulation   Electrical Stimulation Location Cervical   Electrical Stimulation Action IFC   Electrical Stimulation Parameters supine   Electrical Stimulation Goals Pain     Ultrasound   Ultrasound Location right cervical area   Ultrasound Parameters 100% 1.3 w/cm2   Ultrasound Goals Pain     Traction  Type of Traction Cervical   Min (lbs) 15   Hold Time static   Time 15     Manual Therapy   Manual Therapy Soft tissue mobilization;Joint mobilization   Soft tissue mobilization seated to R cervical paraspinals, SCM, UT, and supine traction pulls, passive rotation bil, and STM   [place] gentle cervcial isometrics rotation, extension and side bending to work on what I feel may be a facet                  PT Short Term Goals - 01/31/17 1155      PT SHORT TERM GOAL #2   Title demonstrate decreased tightness / spasm in the  R upper trap/ levator scapulae to decrease pain to </=5/10 with movement and promote cervical mobility (02/02/2017)   Status On-going     PT SHORT TERM GOAL #3   Title she  will be able to verbalize and demo proper posture with sitting/ standing to decrease / reduce muscle tightness/ reinjury (02/02/2017)   Status On-going           PT Long Term Goals - 01/12/17 1002      PT LONG TERM GOAL #1   Title She will be I with all HEP given as of last visit (02/23/2017)   Time 6   Period Weeks   Status New     PT LONG TERM GOAL #2   Title she will increase cervical flexion/ extension by >/= 10 degrees and R sidebending/ rotation by >/= 15 degrees with </= 2/10 pain for safety during driving and ADLS (6/96/7893)   Time 6   Period Weeks   Status New     PT LONG TERM GOAL #3   Title She will be able to return to walking for exercise with </= 3/10 pain for pt's pesonal goal (02/23/2017)   Time 6   Period Weeks   Status New     PT LONG TERM GOAL #4   Title --   Time --   Period --   Status --               Plan - 02/07/17 1003    Clinical Impression Statement Patient has increased ROM and increased tolerance to touch and palpation, she is very tender at teh mastoid area and along the SCM.     PT Next Visit Plan May continue with current plan with isometrics and trying to improve the ROM until she gets the CT scan   Consulted and Agree with Plan of Care Patient      Patient will benefit from skilled therapeutic intervention in order to improve the following deficits and impairments:  Pain, Decreased range of motion, Hypomobility, Decreased mobility, Decreased endurance, Decreased activity tolerance, Decreased balance, Increased fascial restricitons, Increased muscle spasms, Improper body mechanics, Postural dysfunction  Visit Diagnosis: Cervicalgia  Cramp and spasm  Abnormal posture     Problem List Patient Active Problem List   Diagnosis Date Noted  . Neck pain 12/12/2016  . Depression with anxiety 10/17/2016  . Bladder cancer ([place]) 10/17/2016  . OA (osteoarthritis) of hip 07/07/2016  . Vaginitis and vulvovaginitis 01/18/2016  .  Preventative health care 01/18/2016  . Low back pain 01/18/2016  . History of chicken pox   . Hyperlipidemia 12/15/2014  . Allergic rhinitis 06/25/2014  . Allergy to bee sting 06/10/2014  . TMJ tenderness 11/16/2013  . Other malaise and fatigue 05/07/2013  . Trapezius muscle spasm 11/27/2012  . Parkinson's disease (  [place]) 05/24/2012  . HTN (hypertension) 08/31/2011  . Arthritis 08/31/2011  . Benign hypertensive heart disease without heart failure 05/12/2011    Sumner Boast., PT 02/07/2017, 10:05 AM  [address] [place], [place], [address] Phone: [phone]   Fax:  [phone]  Name: Stephanie [name] MRN: [id number] Date of Birth: February 21, 1932

## 2017-02-09 ENCOUNTER — Ambulatory Visit: Payer: Medicare Other | Admitting: Physical Therapy

## 2017-02-09 ENCOUNTER — Encounter: Payer: Self-pay | Admitting: Physical Therapy

## 2017-02-09 DIAGNOSIS — R252 Cramp and spasm: Secondary | ICD-10-CM | POA: Diagnosis not present

## 2017-02-09 DIAGNOSIS — R293 Abnormal posture: Secondary | ICD-10-CM

## 2017-02-09 DIAGNOSIS — M542 Cervicalgia: Secondary | ICD-10-CM | POA: Diagnosis not present

## 2017-02-09 NOTE — Therapy (Signed)
[place] [address] [place], [place], [address] Phone: [phone]   Fax:  [phone]  Physical Therapy Treatment  Patient Details  Name: Stephanie [name] MRN: [id number] Date of Birth: 1932/08/16 Referring Provider: Gwyneth Revels MD  Encounter Date: 02/09/2017      PT End of Session - 02/09/17 1131    Visit Number 8   Date for PT Re-Evaluation 02/23/17   PT Start Time 1046   PT Stop Time 1150   PT Time Calculation (min) 64 min   Activity Tolerance Patient limited by pain   Behavior During Therapy [facility] for tasks assessed/performed      Past Medical History:  Diagnosis Date  . Benign hypertension without congestive heart failure   . Benign mole    right hcets mole, bleeds when dried with towel  . Bilateral dry eyes   . Bladder cancer ([place])   . Bladder cancer ([place]) 10/17/2016  . Cervical spondylosis   . Cervicalgia   . Depression with anxiety 10/17/2016  . Gross hematuria   . History of kidney stones    1970's  . Lumbar stenosis   . OA (osteoarthritis)   . Parkinson disease [facility]) dx 2012   neurologist-  dr siddiqui at [facility]    Past Surgical History:  Procedure Laterality Date  . BIOPSY MASS LEFT FIRST METACARPAL   06/23/2006   benign  . CATARACT EXTRACTION W/ INTRAOCULAR LENS  IMPLANT, BILATERAL  1999 and 2003  . CYSTOSCOPY W/ RETROGRADES Bilateral 09/22/2016   Procedure: CYSTOSCOPY WITH RETROGRADE PYELOGRAM;  Surgeon: Alexis Frock, MD;  [facility];  Service: Urology;  Laterality: Bilateral;  . D & C HYSTEROSCOPY W/ RESECTION POLYP  07/11/2000  . DILATION AND CURETTAGE OF UTERUS  1960's  . EXCISION CYST AND DEBRIDEMENT RIGHT WIRST AND REMOVAL [place] BODY  01/09/2009  . LAPAROSCOPY  yrs ago   infertility   . METACARPOPHALANGEAL JOINT ARTHRODESIS Right 05/25/1996  . TONSILLECTOMY AND ADENOIDECTOMY  child  . TOTAL HIP ARTHROPLASTY Left 07/07/2016   Procedure: LEFT  TOTAL HIP ARTHROPLASTY ANTERIOR APPROACH;  Surgeon: Gaynelle Arabian, MD;  [facility];  Service: Orthopedics;  Laterality: Left;  . TRANSURETHRAL RESECTION OF BLADDER TUMOR N/A 09/22/2016   Procedure: TRANSURETHRAL RESECTION OF BLADDER TUMOR (TURBT);  Surgeon: Alexis Frock, MD;  Location: [facility];  Service: Urology;  Laterality: N/A;    There were no vitals filed for this visit.      Subjective Assessment - 02/09/17 1045    Subjective Reports that she is really stiff today   Currently in Pain? Yes   Pain Score 4    Pain Location Neck   Pain Orientation Right                         [facility] Adult PT Treatment/Exercise - 02/09/17 0001      Neck Exercises: Seated   Cervical Isometrics Flexion;Extension;Right lateral flexion;Left lateral flexion;Right rotation;Left rotation;3 secs;5 reps   Cervical Isometrics Limitations all caused pain when she relaxed   Shoulder Shrugs 20 reps   Shoulder Rolls 10 reps   Other Seated Exercise red tband scapular stabilization exercises   Other Seated Exercise UBE level 3 x 4 minutes     Moist Heat Therapy   Number Minutes Moist Heat 15 Minutes   Moist Heat Location Cervical     Electrical Stimulation   Electrical Stimulation Location Cervical  Electrical Stimulation Action IFC   Electrical Stimulation Parameters supine   Electrical Stimulation Goals Pain     Traction   Type of Traction Cervical   Min (lbs) 15   Hold Time static   Time 15     Manual Therapy   Manual Therapy Soft tissue mobilization;Joint mobilization   Soft tissue mobilization seated to R cervical paraspinals, SCM, UT, and supine traction pulls, passive rotation bil, and STM                  PT Short Term Goals - 01/31/17 1155      PT SHORT TERM GOAL #2   Title demonstrate decreased tightness / spasm in the  R upper trap/ levator scapulae to decrease pain to </=5/10 with movement and promote cervical mobility (02/02/2017)    Status On-going     PT SHORT TERM GOAL #3   Title she will be able to verbalize and demo proper posture with sitting/ standing to decrease / reduce muscle tightness/ reinjury (02/02/2017)   Status On-going           PT Long Term Goals - 02/09/17 1147      PT LONG TERM GOAL #1   Title She will be I with all HEP given as of last visit (02/23/2017)   Status Achieved     PT LONG TERM GOAL #2   Title she will increase cervical flexion/ extension by >/= 10 degrees and R sidebending/ rotation by >/= 15 degrees with </= 2/10 pain for safety during driving and ADLS (07/02/5630)   Status On-going     PT LONG TERM GOAL #3   Title She will be able to return to walking for exercise with </= 3/10 pain for pt's pesonal goal (02/23/2017)   Status On-going     PT LONG TERM GOAL #4   Title report 25% less difficulty with driving               Plan - 02/09/17 1133    Clinical Impression Statement Patient continues to have significant ROM loss and spasms, she is tolerant to touch now which she was not when we began.  She seems to not be able to relax, she immediately winces in pain and the spasms start.   PT Next Visit Plan May continue with current plan with isometrics and trying to improve the ROM until she gets the CT scan   Consulted and Agree with Plan of Care Patient      Patient will benefit from skilled therapeutic intervention in order to improve the following deficits and impairments:     Visit Diagnosis: Cervicalgia  Cramp and spasm  Abnormal posture     Problem List Patient Active Problem List   Diagnosis Date Noted  . Neck pain 12/12/2016  . Depression with anxiety 10/17/2016  . Bladder cancer ([place]) 10/17/2016  . OA (osteoarthritis) of hip 07/07/2016  . Vaginitis and vulvovaginitis 01/18/2016  . Preventative health care 01/18/2016  . Low back pain 01/18/2016  . History of chicken pox   . Hyperlipidemia 12/15/2014  . Allergic rhinitis 06/25/2014  . Allergy  to bee sting 06/10/2014  . TMJ tenderness 11/16/2013  . Other malaise and fatigue 05/07/2013  . Trapezius muscle spasm 11/27/2012  . Parkinson's disease ([place]) 05/24/2012  . HTN (hypertension) 08/31/2011  . Arthritis 08/31/2011  . Benign hypertensive heart disease without heart failure 05/12/2011    Sumner Boast., PT 02/09/2017, 11:49 AM  [place]  [place], [place], [address] Phone: [phone]   Fax:  [phone]  Name: PAETYN PIETRZAK MRN: [id number] Date of Birth: 13-Oct-1932

## 2017-02-13 HISTORY — PX: CERVICAL FUSION: SHX112

## 2017-02-14 ENCOUNTER — Ambulatory Visit: Payer: Medicare Other | Attending: Family Medicine | Admitting: Physical Therapy

## 2017-02-14 ENCOUNTER — Encounter: Payer: Self-pay | Admitting: Physical Therapy

## 2017-02-14 DIAGNOSIS — R293 Abnormal posture: Secondary | ICD-10-CM | POA: Diagnosis not present

## 2017-02-14 DIAGNOSIS — M542 Cervicalgia: Secondary | ICD-10-CM | POA: Insufficient documentation

## 2017-02-14 DIAGNOSIS — R252 Cramp and spasm: Secondary | ICD-10-CM | POA: Diagnosis not present

## 2017-02-14 NOTE — Therapy (Signed)
[place], [place], [address] Phone: [phone]   Fax:  [phone]  Physical Therapy Treatment  Patient Details  Name: Stephanie [name] MRN: [id number] Date of Birth: Jun 26, 1932 Referring Provider: Gwyneth Revels MD  Encounter Date: 02/14/2017      PT End of Session - 02/14/17 0949    Visit Number 9   Date for PT Re-Evaluation 02/23/17   PT Start Time 0920   PT Stop Time 1017   PT Time Calculation (min) 57 min   Activity Tolerance Patient limited by pain   Behavior During Therapy [facility] for tasks assessed/performed      Past Medical History:  Diagnosis Date  . Benign hypertension without congestive heart failure   . Benign mole    right hcets mole, bleeds when dried with towel  . Bilateral dry eyes   . Bladder cancer ([place])   . Bladder cancer ([place]) 10/17/2016  . Cervical spondylosis   . Cervicalgia   . Depression with anxiety 10/17/2016  . Gross hematuria   . History of kidney stones    1970's  . Lumbar stenosis   . OA (osteoarthritis)   . Parkinson disease [facility]) dx 2012   neurologist-  dr siddiqui at [facility]    Past Surgical History:  Procedure Laterality Date  . BIOPSY MASS LEFT FIRST METACARPAL   06/23/2006   benign  . CATARACT EXTRACTION W/ INTRAOCULAR LENS  IMPLANT, BILATERAL  1999 and 2003  . CYSTOSCOPY W/ RETROGRADES Bilateral 09/22/2016   Procedure: CYSTOSCOPY WITH RETROGRADE PYELOGRAM;  Surgeon: Alexis Frock, MD;  Location: [facility];  Service: Urology;  Laterality: Bilateral;  . D & C HYSTEROSCOPY W/ RESECTION POLYP  07/11/2000  . DILATION AND CURETTAGE OF UTERUS  1960's  . EXCISION CYST AND DEBRIDEMENT RIGHT WIRST AND REMOVAL [place] BODY  01/09/2009  . LAPAROSCOPY  yrs ago   infertility   . METACARPOPHALANGEAL JOINT ARTHRODESIS Right 05/25/1996  . TONSILLECTOMY AND ADENOIDECTOMY  child  . TOTAL HIP ARTHROPLASTY Left 07/07/2016   Procedure: LEFT  TOTAL HIP ARTHROPLASTY ANTERIOR APPROACH;  Surgeon: Gaynelle Arabian, MD;  Location: [facility];  Service: Orthopedics;  Laterality: Left;  . TRANSURETHRAL RESECTION OF BLADDER TUMOR N/A 09/22/2016   Procedure: TRANSURETHRAL RESECTION OF BLADDER TUMOR (TURBT);  Surgeon: Alexis Frock, MD;  Location: [facility];  Service: Urology;  Laterality: N/A;    There were no vitals filed for this visit.      Subjective Assessment - 02/14/17 0946    Subjective Reports that after the last treatment she felt really good until the next afternoon.   Currently in Pain? Yes   Pain Score 4    Pain Location Neck   Pain Orientation Right                         [facility] Adult PT Treatment/Exercise - 02/14/17 0001      Moist Heat Therapy   Number Minutes Moist Heat 15 Minutes   Moist Heat Location Cervical     Electrical Stimulation   Electrical Stimulation Location Cervical   Electrical Stimulation Action IFC   Electrical Stimulation Parameters supine   Electrical Stimulation Goals Pain     Ultrasound   Ultrasound Location right cervical area   Ultrasound Parameters 100% 1.3 w/cm2   Ultrasound Goals Pain     Traction   Type of Traction Cervical   Min (lbs) 15  Hold Time static   Time 15     Manual Therapy   Manual Therapy Soft tissue mobilization;Joint mobilization   Soft tissue mobilization seated to R cervical paraspinals, SCM, UT, and supine traction pulls, passive rotation bil, and STM   Mulligan gentle cervcial isometrics rotation, extension and side bending to work on what I feel may be a facet                  PT Short Term Goals - 01/31/17 1155      PT SHORT TERM GOAL #2   Title demonstrate decreased tightness / spasm in the  R upper trap/ levator scapulae to decrease pain to </=5/10 with movement and promote cervical mobility (02/02/2017)   Status On-going     PT SHORT TERM GOAL #3   Title she will be able to verbalize and demo proper posture  with sitting/ standing to decrease / reduce muscle tightness/ reinjury (02/02/2017)   Status On-going           PT Long Term Goals - 02/09/17 1147      PT LONG TERM GOAL #1   Title She will be I with all HEP given as of last visit (02/23/2017)   Status Achieved     PT LONG TERM GOAL #2   Title she will increase cervical flexion/ extension by >/= 10 degrees and R sidebending/ rotation by >/= 15 degrees with </= 2/10 pain for safety during driving and ADLS (8/41/6606)   Status On-going     PT LONG TERM GOAL #3   Title She will be able to return to walking for exercise with </= 3/10 pain for pt's pesonal goal (02/23/2017)   Status On-going     PT LONG TERM GOAL #4   Title report 25% less difficulty with driving               Plan - 02/14/17 0949    Clinical Impression Statement Patient has knots in the right mastoid area and the right upper trap.  She responded well to the last treatment, tried this again   PT Next Visit Plan May continue with current plan with isometrics and trying to improve the ROM until she gets the CT scan   Consulted and Agree with Plan of Care Patient      Patient will benefit from skilled therapeutic intervention in order to improve the following deficits and impairments:     Visit Diagnosis: Cervicalgia  Cramp and spasm  Abnormal posture     Problem List Patient Active Problem List   Diagnosis Date Noted  . Neck pain 12/12/2016  . Depression with anxiety 10/17/2016  . Bladder cancer ([place]) 10/17/2016  . OA (osteoarthritis) of hip 07/07/2016  . Vaginitis and vulvovaginitis 01/18/2016  . Preventative health care 01/18/2016  . Low back pain 01/18/2016  . History of chicken pox   . Hyperlipidemia 12/15/2014  . Allergic rhinitis 06/25/2014  . Allergy to bee sting 06/10/2014  . TMJ tenderness 11/16/2013  . Other malaise and fatigue 05/07/2013  . Trapezius muscle spasm 11/27/2012  . Parkinson's disease ([place]) 05/24/2012  . HTN  (hypertension) 08/31/2011  . Arthritis 08/31/2011  . Benign hypertensive heart disease without heart failure 05/12/2011    Sumner Boast., PT 02/14/2017, 9:51 AM  [place] [address] [place], [place], [address] Phone: [phone]   Fax:  [phone]  Name: Stephanie [name] MRN: [id number] Date of Birth: Nov 20, 1931

## 2017-02-16 ENCOUNTER — Ambulatory Visit: Payer: Medicare Other | Admitting: Physical Therapy

## 2017-02-16 ENCOUNTER — Encounter: Payer: Self-pay | Admitting: Physical Therapy

## 2017-02-16 DIAGNOSIS — R252 Cramp and spasm: Secondary | ICD-10-CM

## 2017-02-16 DIAGNOSIS — R293 Abnormal posture: Secondary | ICD-10-CM | POA: Diagnosis not present

## 2017-02-16 DIAGNOSIS — M542 Cervicalgia: Secondary | ICD-10-CM

## 2017-02-16 NOTE — Therapy (Addendum)
[place], [place], [address] Phone: [phone]   Fax:  [phone]  Physical Therapy Treatment / Discharge Summary  Patient Details  Name: Stephanie [name] MRN: [id number] Date of Birth: 1932-01-07 Referring Provider: Gwyneth Revels MD  Encounter Date: 02/16/2017      PT End of Session - 02/16/17 1433    Visit Number 10   Date for PT Re-Evaluation 02/23/17   PT Start Time 1347   PT Stop Time 1445   PT Time Calculation (min) 58 min   Activity Tolerance Patient limited by pain   Behavior During Therapy [facility] for tasks assessed/performed      Past Medical History:  Diagnosis Date  . Benign hypertension without congestive heart failure   . Benign mole    right hcets mole, bleeds when dried with towel  . Bilateral dry eyes   . Bladder cancer ([place])   . Bladder cancer ([place]) 10/17/2016  . Cervical spondylosis   . Cervicalgia   . Depression with anxiety 10/17/2016  . Gross hematuria   . History of kidney stones    1970's  . Lumbar stenosis   . OA (osteoarthritis)   . Parkinson disease [facility]) dx 2012   neurologist-  dr siddiqui at [facility]    Past Surgical History:  Procedure Laterality Date  . BIOPSY MASS LEFT FIRST METACARPAL   06/23/2006   benign  . CATARACT EXTRACTION W/ INTRAOCULAR LENS  IMPLANT, BILATERAL  1999 and 2003  . CYSTOSCOPY W/ RETROGRADES Bilateral 09/22/2016   Procedure: CYSTOSCOPY WITH RETROGRADE PYELOGRAM;  Surgeon: Alexis Frock, MD;  Location: [facility];  Service: Urology;  Laterality: Bilateral;  . D & C HYSTEROSCOPY W/ RESECTION POLYP  07/11/2000  . DILATION AND CURETTAGE OF UTERUS  1960's  . EXCISION CYST AND DEBRIDEMENT RIGHT WIRST AND REMOVAL [place] BODY  01/09/2009  . LAPAROSCOPY  yrs ago   infertility   . METACARPOPHALANGEAL JOINT ARTHRODESIS Right 05/25/1996  . TONSILLECTOMY AND ADENOIDECTOMY  child  . TOTAL HIP ARTHROPLASTY Left 07/07/2016    Procedure: LEFT TOTAL HIP ARTHROPLASTY ANTERIOR APPROACH;  Surgeon: Gaynelle Arabian, MD;  [facility];  Service: Orthopedics;  Laterality: Left;  . TRANSURETHRAL RESECTION OF BLADDER TUMOR N/A 09/22/2016   Procedure: TRANSURETHRAL RESECTION OF BLADDER TUMOR (TURBT);  Surgeon: Alexis Frock, MD;  Location: [facility];  Service: Urology;  Laterality: N/A;    There were no vitals filed for this visit.      Subjective Assessment - 02/16/17 1431    Subjective Reports she has had some increased pain recently   Currently in Pain? Yes   Pain Score 6    Pain Location Neck   Pain Orientation Right   Pain Descriptors / Indicators Aching                         [facility] Adult PT Treatment/Exercise - 02/16/17 0001      Moist Heat Therapy   Number Minutes Moist Heat 15 Minutes   Moist Heat Location Cervical     Electrical Stimulation   Electrical Stimulation Location Cervical   Electrical Stimulation Action IFC   Electrical Stimulation Parameters supine   Electrical Stimulation Goals Pain     Ultrasound   Ultrasound Location right cervical SCM upper trap area   Ultrasound Parameters 100% 1.3 w/cn2   Ultrasound Goals Pain     Manual Therapy   Manual Therapy  Soft tissue mobilization;Joint mobilization   Soft tissue mobilization seated to R cervical paraspinals, SCM, UT, and supine traction pulls, passive rotation bil, and STM   Mulligan gentle cervcial isometrics rotation, extension and side bending to work on what I feel may be a facet                  PT Short Term Goals - 01/31/17 1155      PT SHORT TERM GOAL #2   Title demonstrate decreased tightness / spasm in the  R upper trap/ levator scapulae to decrease pain to </=5/10 with movement and promote cervical mobility (02/02/2017)   Status On-going     PT SHORT TERM GOAL #3   Title she will be able to verbalize and demo proper posture with sitting/ standing to decrease / reduce muscle  tightness/ reinjury (02/02/2017)   Status On-going           PT Long Term Goals - 02-17-17 1436      PT LONG TERM GOAL #1   Title She will be I with all HEP given as of last visit (02/23/2017)   Status Achieved     PT LONG TERM GOAL #2   Title she will increase cervical flexion/ extension by >/= 10 degrees and R sidebending/ rotation by >/= 15 degrees with </= 2/10 pain for safety during driving and ADLS (7/74/1287)   Status On-going     PT LONG TERM GOAL #3   Title She will be able to return to walking for exercise with </= 3/10 pain for pt's pesonal goal (02/23/2017)   Status Partially Met     PT LONG TERM GOAL #4   Title report 25% less difficulty with driving   Status Achieved     PT LONG TERM GOAL #5   Title report sleeping 25% better   Status Achieved               Plan - 2017/02/17 1433    Clinical Impression Statement Patient has had continued increased spasms in the right cervical area, mostly SCM, scalenes and the upper traps.  She has had some relief at times after PT but continues to have limited carry over.  We have tried dry needling, traction, STM and exercises.  I have felt that she may have a facet issue but cannot get much relief for her   PT Next Visit Plan May continue with current plan with isometrics and trying to improve the ROM until she gets the CT scan   Consulted and Agree with Plan of Care Patient      Patient will benefit from skilled therapeutic intervention in order to improve the following deficits and impairments:  Pain, Decreased range of motion, Hypomobility, Decreased mobility, Decreased endurance, Decreased activity tolerance, Decreased balance, Increased fascial restricitons, Increased muscle spasms, Improper body mechanics, Postural dysfunction  Visit Diagnosis: Cervicalgia - Plan: PT plan of care cert/re-cert  Cramp and spasm - Plan: PT plan of care cert/re-cert  Abnormal posture - Plan: PT plan of care cert/re-cert        G-Codes - 17-Feb-2017 1437    Functional Assessment Tool Used (Outpatient Only) clinical judgement   Functional Limitation Changing and maintaining body position   Changing and Maintaining Body Position Current Status (O6767) At least 60 percent but less than 80 percent impaired, limited or restricted   Changing and Maintaining Body Position Goal Status (M0947) At least 20 percent but less than 40 percent impaired, limited or restricted  Problem List Patient Active Problem List   Diagnosis Date Noted  . Neck pain 12/12/2016  . Depression with anxiety 10/17/2016  . Bladder cancer ([place]) 10/17/2016  . OA (osteoarthritis) of hip 07/07/2016  . Vaginitis and vulvovaginitis 01/18/2016  . Preventative health care 01/18/2016  . Low back pain 01/18/2016  . History of chicken pox   . Hyperlipidemia 12/15/2014  . Allergic rhinitis 06/25/2014  . Allergy to bee sting 06/10/2014  . TMJ tenderness 11/16/2013  . Other malaise and fatigue 05/07/2013  . Trapezius muscle spasm 11/27/2012  . Parkinson's disease ([place]) 05/24/2012  . HTN (hypertension) 08/31/2011  . Arthritis 08/31/2011  . Benign hypertensive heart disease without heart failure 05/12/2011    Sumner Boast., PT 02/16/2017, 2:39 PM  [place] [place], [place], [address] Phone: [phone]   Fax:  [phone]  Name: Stephanie [name] MRN: [id number] Date of Birth: 09-16-1932    PHYSICAL THERAPY DISCHARGE SUMMARY  Visits from Start of Care: 10  Current functional level related to goals / functional outcomes: See goals   Remaining deficits:  unknown    Education / Equipment: HEP, posture education  Plan: Patient agrees to discharge.  Patient goals were partially met. Patient is being discharged due to meeting the stated rehab goals.  ?????     Kristoffer Leamon PT, DPT, LAT, ATC  03/24/17  1:39 PM

## 2017-02-17 DIAGNOSIS — M4722 Other spondylosis with radiculopathy, cervical region: Secondary | ICD-10-CM | POA: Diagnosis not present

## 2017-02-17 DIAGNOSIS — Z01812 Encounter for preprocedural laboratory examination: Secondary | ICD-10-CM | POA: Diagnosis not present

## 2017-02-17 DIAGNOSIS — M5412 Radiculopathy, cervical region: Secondary | ICD-10-CM | POA: Diagnosis not present

## 2017-02-17 DIAGNOSIS — M5481 Occipital neuralgia: Secondary | ICD-10-CM | POA: Diagnosis not present

## 2017-02-17 DIAGNOSIS — M542 Cervicalgia: Secondary | ICD-10-CM | POA: Diagnosis not present

## 2017-02-17 DIAGNOSIS — R51 Headache: Secondary | ICD-10-CM | POA: Diagnosis not present

## 2017-02-17 NOTE — Progress Notes (Signed)
83 y.o. G2P1 WidowedCaucasianF here for annual exam.  Had hip replacement in August.  Was placed on Xarelto.  Then had hematuria.  Referred to urologist--Dr. Tresa Moore.  Had tumor in her bladder and was treated with instillations with chemo.  Has follow-up in May.  Will have follow-up in May.    Having neck surgery at [facility] in two weeks.    Denies vaginal bleeding.    Patient's last menstrual period was 01/13/2013.          Sexually active: No.  The current method of family planning is post menopausal status.    Exercising: Yes.    Walking Smoker:  no  Health Maintenance: Pap:  10/28/15 Neg   08/29/14 Neg  History of abnormal Pap:  yes MMG:  03/08/16 BIRADS2:Benign. Has appt in May Colonoscopy:  01/2003 f/u 10 years BMD:   03/08/16 Normal  TDaP:  02/2014  Pneumonia vaccine(s):  2013, 2016  Zostavax:   2012 Hep C testing: N/A Screening Labs: PCP, Urine today: not collected    reports that she quit smoking about 52 years ago. She quit after 10.00 years of use. She has never used smokeless tobacco. She reports that she drinks about 2.0 oz of alcohol per week . She reports that she does not use drugs.  Past Medical History:  Diagnosis Date  . Benign hypertension without congestive heart failure   . Benign mole    right hcets mole, bleeds when dried with towel  . Bilateral dry eyes   . Bladder cancer ([place])   . Bladder cancer ([place]) 10/17/2016  . Cervical spondylosis   . Cervicalgia   . Depression with anxiety 10/17/2016  . Gross hematuria   . History of kidney stones    1970's  . Lumbar stenosis   . Malignant tumor of urinary bladder ([place]) 07/2016   Pre-stage I  . OA (osteoarthritis)   . Parkinson disease [facility]) dx 2012   neurologist-  dr siddiqui at [facility]    Past Surgical History:  Procedure Laterality Date  . BIOPSY MASS LEFT FIRST METACARPAL   06/23/2006   benign  . CATARACT EXTRACTION W/ INTRAOCULAR LENS  IMPLANT, BILATERAL  1999 and 2003  . CYSTOSCOPY W/ RETROGRADES  Bilateral 09/22/2016   Procedure: CYSTOSCOPY WITH RETROGRADE PYELOGRAM;  Surgeon: Alexis Frock, MD;  Location: [facility];  Service: Urology;  Laterality: Bilateral;  . D & C HYSTEROSCOPY W/ RESECTION POLYP  07/11/2000  . DILATION AND CURETTAGE OF UTERUS  1960's  . EXCISION CYST AND DEBRIDEMENT RIGHT WIRST AND REMOVAL [place] BODY  01/09/2009  . LAPAROSCOPY  yrs ago   infertility   . METACARPOPHALANGEAL JOINT ARTHRODESIS Right 05/25/1996  . TONSILLECTOMY AND ADENOIDECTOMY  child  . TOTAL HIP ARTHROPLASTY Left 07/07/2016   Procedure: LEFT TOTAL HIP ARTHROPLASTY ANTERIOR APPROACH;  Surgeon: Gaynelle Arabian, MD;  [facility];  Service: Orthopedics;  Laterality: Left;  . TRANSURETHRAL RESECTION OF BLADDER TUMOR N/A 09/22/2016   Procedure: TRANSURETHRAL RESECTION OF BLADDER TUMOR (TURBT);  Surgeon: Alexis Frock, MD;  Location: [facility];  Service: Urology;  Laterality: N/A;    Current Outpatient Prescriptions  Medication Sig Dispense Refill  . acetaminophen (TYLENOL) 500 MG tablet Take 1,000 mg by mouth every 6 (six) hours as needed for moderate pain.    Marland Kitchen CALCIUM-MAGNESIUM-ZINC PO Take by mouth daily.    . carbidopa-levodopa (SINEMET IR) 25-100 MG per tablet Take 1 tablet by mouth 3 (three) times daily.     Marland Kitchen  Cholecalciferol (VITAMIN D) 2000 units CAPS Take 1 capsule by mouth daily.    . Coenzyme Q10 (COQ-10 PO) Take 300 mg by mouth daily.    . diazepam (VALIUM) 5 MG tablet TAKE 1 TABLET BY MOUTH TWICE A DAY AS NEEDED 60 tablet 0  . diclofenac sodium (VOLTAREN) 1 % GEL as needed.    Marland Kitchen estradiol (ESTRACE) 0.5 MG tablet TAKE 1/2 TABLET BY MOUTH EVERY DAY (Patient taking differently: TAKE 1/2 TABLET BY MOUTH EVERY DAY--  takes in am) 45 tablet 3  . hydrochlorothiazide (HYDRODIURIL) 25 MG tablet TAKE 1 1/2 TABLETS BY MOUTH DAILY 45 tablet 1  . loratadine (CLARITIN) 10 MG tablet Take 10 mg by mouth daily.    . medroxyPROGESTERone (PROVERA) 2.5 MG tablet TAKE  1/2 TABLET BY MOUTH DAILY 45 tablet 0  . Multiple Vitamins-Minerals (CENTRUM SILVER PO) Take 1 tablet by mouth daily.    Vladimir Faster Glycol-Propyl Glycol (SYSTANE) 0.4-0.3 % SOLN Apply 1 drop to eye every morning.    . Probiotic Product (PROBIOTIC DAILY PO) Take 1 capsule by mouth daily.    . sertraline (ZOLOFT) 25 MG tablet TAKE 1 TABLET BY MOUTH EVERY DAY--  takes in am 90 tablet 0  . tiZANidine (ZANAFLEX) 2 MG tablet TAKE 1 TABLET (2 MG TOTAL) BY MOUTH EVERY 8 (EIGHT) HOURS AS NEEDED FOR MUSCLE SPASMS. 30 tablet 1  . diclofenac (VOLTAREN) 25 MG EC tablet Take 25 mg by mouth daily.    Marland Kitchen EPINEPHrine 0.3 mg/0.3 mL IJ SOAJ injection Inject 0.3 mLs (0.3 mg total) into the muscle once. (Patient not taking: Reported on 02/18/2017) 2 Device 2   Current Facility-Administered Medications  Medication Dose Route Frequency Provider Last Rate Last Dose  . AEROCHAMBER PLUS FLO-VU MEDIUM MISC 1 each  1 each Other Once Mosie Lukes, MD        Family History  Problem Relation Age of Onset  . Arthritis Mother   . Hypertension Mother   . Deep vein thrombosis Mother     recurrent, secondary to Bleeding disorder  . Arthritis Father   . Stroke Father   . Diabetes Sister   . Heart disease Sister   . Obesity Sister   . Arthritis Sister   . Hypertension Sister   . Heart disease Maternal Grandmother   . Heart disease Maternal Grandfather   . Peripheral vascular disease Paternal Grandmother     s/p leg amputation  . Stroke Paternal Grandfather     ROS:  Pertinent items are noted in HPI.  Otherwise, a comprehensive ROS was negative.  Exam:   LMP 01/13/2013 Comment: spotting-had benign endometrial biopsy   Weight change: @WEIGHTCHANGE @ Height:      Ht Readings from Last 3 Encounters:  12/06/16 5\' 4"  (1.626 m)  12/03/16 5\' 4"  (1.626 m)  11/11/16 5\' 4"  (1.626 m)    General appearance: alert, cooperative and appears stated age Head: Normocephalic, without obvious abnormality, atraumatic Neck: no  adenopathy, supple, symmetrical, trachea midline and thyroid normal to inspection and palpation Lungs: clear to auscultation bilaterally Breasts: normal appearance, no masses or tenderness Heart: regular rate and rhythm Abdomen: soft, non-tender; bowel sounds normal; no masses,  no organomegaly Extremities: extremities normal, atraumatic, no cyanosis or edema Skin: Skin color, texture, turgor normal. No rashes or lesions Lymph nodes: Cervical, supraclavicular, and axillary nodes normal. No abnormal inguinal nodes palpated Neurologic: Grossly normal   Pelvic: External genitalia:  no lesions  Urethra:  normal appearing urethra with no masses, tenderness or lesions              Bartholins and Skenes: normal                 Vagina: normal appearing vagina with normal color and discharge, no lesions              Cervix: no lesions              Pap taken: Yes.   Bimanual Exam:  Uterus:  normal size, contour, position, consistency, mobility, non-tender              Adnexa: normal adnexa and no mass, fullness, tenderness               Rectovaginal: Confirms               Anus:  normal sphincter tone, no lesions  Chaperone was present for exam.  A:  Well Woman with normal exam PMP., no HRT Hypertension Parkinson's, being followed at [place] H/O depression  P:   Mammogram guidelines reviewed.  Pt doing yearly. pap smear obtained today.  Pt desires this yearly.  Hx of STD exposure with husband Lab work done with Dr. Quay Burow and is up to date Pt adamant about staying on HRT.  Rx for estradiol 0.5mg  and provera 2.5mg  1/2 tab daily to pharmacy.  Pt and I reviewed risks including DVT, PE, stroke, MI, breast cancer return annually or prn

## 2017-02-18 ENCOUNTER — Encounter: Payer: Self-pay | Admitting: Obstetrics & Gynecology

## 2017-02-18 ENCOUNTER — Ambulatory Visit (INDEPENDENT_AMBULATORY_CARE_PROVIDER_SITE_OTHER): Payer: Medicare Other | Admitting: Obstetrics & Gynecology

## 2017-02-18 DIAGNOSIS — Z8551 Personal history of malignant neoplasm of bladder: Secondary | ICD-10-CM | POA: Diagnosis not present

## 2017-02-18 DIAGNOSIS — Z01419 Encounter for gynecological examination (general) (routine) without abnormal findings: Secondary | ICD-10-CM

## 2017-02-18 DIAGNOSIS — Z124 Encounter for screening for malignant neoplasm of cervix: Secondary | ICD-10-CM | POA: Insufficient documentation

## 2017-02-18 MED ORDER — ESTRADIOL 0.5 MG PO TABS: ORAL_TABLET | ORAL | 3 refills | Status: DC

## 2017-02-18 MED ORDER — MEDROXYPROGESTERONE ACETATE 2.5 MG PO TABS: 1.2500 mg | ORAL_TABLET | Freq: Every day | ORAL | 3 refills | Status: DC

## 2017-02-22 LAB — CYTOLOGY - PAP: Diagnosis: NEGATIVE

## 2017-02-23 DIAGNOSIS — G542 Cervical root disorders, not elsewhere classified: Secondary | ICD-10-CM | POA: Diagnosis not present

## 2017-02-23 DIAGNOSIS — Z886 Allergy status to analgesic agent status: Secondary | ICD-10-CM | POA: Diagnosis not present

## 2017-02-23 DIAGNOSIS — I10 Essential (primary) hypertension: Secondary | ICD-10-CM | POA: Diagnosis present

## 2017-02-23 DIAGNOSIS — G20 Parkinson's disease: Secondary | ICD-10-CM | POA: Diagnosis present

## 2017-02-23 DIAGNOSIS — M532X2 Spinal instabilities, cervical region: Secondary | ICD-10-CM | POA: Diagnosis present

## 2017-02-23 DIAGNOSIS — Z87891 Personal history of nicotine dependence: Secondary | ICD-10-CM | POA: Diagnosis not present

## 2017-02-23 DIAGNOSIS — M4691 Unspecified inflammatory spondylopathy, occipito-atlanto-axial region: Secondary | ICD-10-CM | POA: Diagnosis not present

## 2017-02-23 DIAGNOSIS — Z823 Family history of stroke: Secondary | ICD-10-CM | POA: Diagnosis not present

## 2017-02-23 DIAGNOSIS — M5382 Other specified dorsopathies, cervical region: Secondary | ICD-10-CM | POA: Diagnosis not present

## 2017-02-23 DIAGNOSIS — Z8249 Family history of ischemic heart disease and other diseases of the circulatory system: Secondary | ICD-10-CM | POA: Diagnosis not present

## 2017-02-23 DIAGNOSIS — Z79899 Other long term (current) drug therapy: Secondary | ICD-10-CM | POA: Diagnosis not present

## 2017-02-23 DIAGNOSIS — M4722 Other spondylosis with radiculopathy, cervical region: Secondary | ICD-10-CM | POA: Diagnosis not present

## 2017-02-23 DIAGNOSIS — Z8052 Family history of malignant neoplasm of bladder: Secondary | ICD-10-CM | POA: Diagnosis not present

## 2017-02-23 DIAGNOSIS — M50322 Other cervical disc degeneration at C5-C6 level: Secondary | ICD-10-CM | POA: Diagnosis not present

## 2017-02-23 DIAGNOSIS — Z981 Arthrodesis status: Secondary | ICD-10-CM | POA: Diagnosis not present

## 2017-02-23 DIAGNOSIS — M532X1 Spinal instabilities, occipito-atlanto-axial region: Secondary | ICD-10-CM | POA: Diagnosis not present

## 2017-02-23 DIAGNOSIS — Z888 Allergy status to other drugs, medicaments and biological substances status: Secondary | ICD-10-CM | POA: Diagnosis not present

## 2017-02-23 DIAGNOSIS — M50123 Cervical disc disorder at C6-C7 level with radiculopathy: Secondary | ICD-10-CM | POA: Diagnosis present

## 2017-02-23 DIAGNOSIS — Z8551 Personal history of malignant neoplasm of bladder: Secondary | ICD-10-CM | POA: Diagnosis not present

## 2017-02-23 DIAGNOSIS — M50323 Other cervical disc degeneration at C6-C7 level: Secondary | ICD-10-CM | POA: Diagnosis not present

## 2017-02-23 DIAGNOSIS — Z880 Allergy status to penicillin: Secondary | ICD-10-CM | POA: Diagnosis not present

## 2017-02-23 DIAGNOSIS — Z79890 Hormone replacement therapy (postmenopausal): Secondary | ICD-10-CM | POA: Diagnosis not present

## 2017-02-23 DIAGNOSIS — M2578 Osteophyte, vertebrae: Secondary | ICD-10-CM | POA: Diagnosis not present

## 2017-02-24 ENCOUNTER — Other Ambulatory Visit (HOSPITAL_COMMUNITY)
Admission: RE | Admit: 2017-02-24 | Discharge: 2017-02-24 | Disposition: A | Discharge status: Home / Self Care | Payer: Medicare Other | Source: Ambulatory Visit | Attending: Obstetrics & Gynecology | Admitting: Obstetrics & Gynecology

## 2017-03-04 DIAGNOSIS — Z4889 Encounter for other specified surgical aftercare: Secondary | ICD-10-CM | POA: Diagnosis not present

## 2017-03-04 DIAGNOSIS — Z981 Arthrodesis status: Secondary | ICD-10-CM | POA: Diagnosis not present

## 2017-03-04 DIAGNOSIS — Z48811 Encounter for surgical aftercare following surgery on the nervous system: Secondary | ICD-10-CM | POA: Diagnosis not present

## 2017-03-07 ENCOUNTER — Other Ambulatory Visit: Payer: Self-pay | Admitting: Family Medicine

## 2017-03-13 ENCOUNTER — Telehealth: Payer: Self-pay | Admitting: Family Medicine

## 2017-03-14 NOTE — Telephone Encounter (Signed)
Requesting:    diazepam Contract      01/13/2017 UDS     none Last OV     01/04/2017-----future appt is on 04/04/2017 Last Refill    #60 no refills on 01/13/2017  Please Advise

## 2017-03-15 NOTE — Telephone Encounter (Signed)
Faxed hardcopy for diazepam to [facility] in [place]

## 2017-03-15 NOTE — Telephone Encounter (Signed)
Pt called in to follow up. Pt says that she no longer use the [facility] Her pharmacy with insurance changed. [facility] [address].   Please advise pt when complete.    Thanks.

## 2017-03-15 NOTE — Telephone Encounter (Signed)
Canceled refill at [facility]. [facility] [place]

## 2017-03-29 DIAGNOSIS — C672 Malignant neoplasm of lateral wall of bladder: Secondary | ICD-10-CM | POA: Diagnosis not present

## 2017-03-31 DIAGNOSIS — M50322 Other cervical disc degeneration at C5-C6 level: Secondary | ICD-10-CM | POA: Diagnosis not present

## 2017-03-31 DIAGNOSIS — M4312 Spondylolisthesis, cervical region: Secondary | ICD-10-CM | POA: Diagnosis not present

## 2017-03-31 DIAGNOSIS — Z981 Arthrodesis status: Secondary | ICD-10-CM | POA: Diagnosis not present

## 2017-03-31 DIAGNOSIS — Z4789 Encounter for other orthopedic aftercare: Secondary | ICD-10-CM | POA: Diagnosis not present

## 2017-03-31 DIAGNOSIS — Z9889 Other specified postprocedural states: Secondary | ICD-10-CM | POA: Diagnosis not present

## 2017-03-31 DIAGNOSIS — M50323 Other cervical disc degeneration at C6-C7 level: Secondary | ICD-10-CM | POA: Diagnosis not present

## 2017-04-04 ENCOUNTER — Ambulatory Visit: Payer: BLUE CROSS/BLUE SHIELD | Admitting: Family Medicine

## 2017-04-04 ENCOUNTER — Encounter: Payer: Self-pay | Admitting: Family Medicine

## 2017-04-04 ENCOUNTER — Ambulatory Visit (INDEPENDENT_AMBULATORY_CARE_PROVIDER_SITE_OTHER): Payer: Medicare Other | Admitting: Family Medicine

## 2017-04-04 VITALS — BP 120/70 | HR 99 | Temp 98.1°F | Resp 18 | Wt 125.0 lb

## 2017-04-04 DIAGNOSIS — I119 Hypertensive heart disease without heart failure: Secondary | ICD-10-CM

## 2017-04-04 DIAGNOSIS — C679 Malignant neoplasm of bladder, unspecified: Secondary | ICD-10-CM | POA: Diagnosis not present

## 2017-04-04 DIAGNOSIS — G20 Parkinson's disease: Secondary | ICD-10-CM | POA: Diagnosis not present

## 2017-04-04 DIAGNOSIS — I10 Essential (primary) hypertension: Secondary | ICD-10-CM

## 2017-04-04 DIAGNOSIS — E785 Hyperlipidemia, unspecified: Secondary | ICD-10-CM | POA: Diagnosis not present

## 2017-04-04 DIAGNOSIS — M47812 Spondylosis without myelopathy or radiculopathy, cervical region: Secondary | ICD-10-CM

## 2017-04-04 DIAGNOSIS — M4692 Unspecified inflammatory spondylopathy, cervical region: Secondary | ICD-10-CM | POA: Diagnosis not present

## 2017-04-04 NOTE — Progress Notes (Signed)
Subjective:  I acted as a Education administrator for Dr. Charlett Blake. Princess, RMA\  Patient ID: Stephanie [name], female    DOB: 12-02-1931, 83 y.o.   MRN: [id number]  Chief Complaint  Patient presents with  . Follow-up    HPI  Patient is in today for a 3 month follow up on HTN, hyperlipidemia and other medical concerns. Patient has no acute concerns. She stated that she see's dermatology this year and has kept an eye out on a black spot that is on her left foot. It has been stable. She had posterior cervical fusion with plate placement to C1 and C2 at [facility] in April and is healing well. She reports an immediate improvement in her neck pain and is happy with her response. No recent illness or febrile concern. No other hospitalization or acute concerns.. Denies CP/palp/SOB/HA/congestion/fevers/GI or GU c/o. Taking meds as prescribed  Patient Care Team: Mosie Lukes, MD as PCP - General (Family Medicine) Megan Salon, MD as Consulting Physician (Gynecology) Eldridge Abrahams, MD as Referring Physician (Neurology) Alexis Frock, MD as Consulting Physician (Urology) Katy Fitch, Darlina Guys, MD as Consulting Physician (Ophthalmology) Corene Cornea, DC (Chiropractic Medicine) Jari Pigg, MD as Consulting Physician (Dermatology)   Past Medical History:  Diagnosis Date  . Arthropathy of cervical spine ([place]) 11/27/2012   Right  Xray at [facility]  On 04/08/2016  AP, lateral, and lateral flexion and extension views of the cervical spine are submitted for evaluation.   No acute fracture identified in the cervical spine.   There is redemonstration of approximately 2 mm of C3 on C4 anterolisthesis in neutral which slightly increases in flexion relative to neutral and extension. Slight C5 on C6 retrolisthesis does not change i  . Benign hypertension without congestive heart failure   . Benign mole    right hcets mole, bleeds when dried with towel  . Bilateral dry eyes   . Bladder cancer ([place])   .  Bladder cancer ([place]) 10/17/2016  . Cervical spondylosis   . Cervicalgia   . Depression with anxiety 10/17/2016  . Gross hematuria   . History of kidney stones    1970's  . Lumbar stenosis   . Malignant tumor of urinary bladder ([place]) 07/2016   Pre-stage I  . OA (osteoarthritis)   . Parkinson disease [facility]) dx 2012   neurologist-  dr siddiqui at [facility]    Past Surgical History:  Procedure Laterality Date  . BIOPSY MASS LEFT FIRST METACARPAL   06/23/2006   benign  . CATARACT EXTRACTION W/ INTRAOCULAR LENS  IMPLANT, BILATERAL  1999 and 2003  . CYSTOSCOPY W/ RETROGRADES Bilateral 09/22/2016   Procedure: CYSTOSCOPY WITH RETROGRADE PYELOGRAM;  Surgeon: Alexis Frock, MD;  Location: [facility];  Service: Urology;  Laterality: Bilateral;  . D & C HYSTEROSCOPY W/ RESECTION POLYP  07/11/2000  . DILATION AND CURETTAGE OF UTERUS  1960's  . EXCISION CYST AND DEBRIDEMENT RIGHT WIRST AND REMOVAL [place] BODY  01/09/2009  . LAPAROSCOPY  yrs ago   infertility   . METACARPOPHALANGEAL JOINT ARTHRODESIS Right 05/25/1996  . TONSILLECTOMY AND ADENOIDECTOMY  child  . TOTAL HIP ARTHROPLASTY Left 07/07/2016   Procedure: LEFT TOTAL HIP ARTHROPLASTY ANTERIOR APPROACH;  Surgeon: Gaynelle Arabian, MD;  Location: [facility];  Service: Orthopedics;  Laterality: Left;  . TRANSURETHRAL RESECTION OF BLADDER TUMOR N/A 09/22/2016   Procedure: TRANSURETHRAL RESECTION OF BLADDER TUMOR (TURBT);  Surgeon: Alexis Frock, MD;  Location: [facility];  Service: Urology;  Laterality: N/A;    Family History  Problem Relation Age of Onset  . Arthritis Mother   . Hypertension Mother   . Deep vein thrombosis Mother        recurrent, secondary to Bleeding disorder  . Arthritis Father   . Stroke Father   . Diabetes Sister   . Heart disease Sister   . Obesity Sister   . Arthritis Sister   . Hypertension Sister   . Heart disease Maternal Grandmother   . Heart disease Maternal Grandfather   .  Peripheral vascular disease Paternal Grandmother        s/p leg amputation  . Stroke Paternal Grandfather     Social History   Social History  . Marital status: Widowed    Spouse name: N/A  . Number of children: N/A  . Years of education: N/A   Occupational History  . Not on file.   Social History Main Topics  . Smoking status: Former Smoker    Years: 10.00    Quit date: 05/04/1964  . Smokeless tobacco: Never Used  . Alcohol use 2.0 oz/week    4 Standard drinks or equivalent per week     Comment: social  . Drug use: No  . Sexual activity: No   Other Topics Concern  . Not on file   Social History Narrative  . No narrative on file    Outpatient Medications Prior to Visit  Medication Sig Dispense Refill  . acetaminophen (TYLENOL) 500 MG tablet Take 1,000 mg by mouth every 6 (six) hours as needed for moderate pain.    Marland Kitchen CALCIUM-MAGNESIUM-ZINC PO Take by mouth daily.    . carbidopa-levodopa (SINEMET IR) 25-100 MG per tablet Take 1 tablet by mouth 3 (three) times daily.     . Cholecalciferol (VITAMIN D) 2000 units CAPS Take 1 capsule by mouth daily.    . Coenzyme Q10 (COQ-10 PO) Take 300 mg by mouth daily.    . diazepam (VALIUM) 5 MG tablet TAKE 1 TABLET BY MOUTH TWICE A DAY 60 tablet 0  . diclofenac sodium (VOLTAREN) 1 % GEL as needed.    Marland Kitchen EPINEPHrine 0.3 mg/0.3 mL IJ SOAJ injection Inject 0.3 mLs (0.3 mg total) into the muscle once. 2 Device 2  . estradiol (ESTRACE) 0.5 MG tablet TAKE 1/2 TABLET BY MOUTH EVERY DAY--  takes in am 45 tablet 3  . hydrochlorothiazide (HYDRODIURIL) 25 MG tablet TAKE 1 1/2 TABLETS BY MOUTH DAILY 45 tablet 1  . loratadine (CLARITIN) 10 MG tablet Take 10 mg by mouth daily.    . medroxyPROGESTERone (PROVERA) 2.5 MG tablet Take 0.5 tablets (1.25 mg total) by mouth daily. 45 tablet 3  . Multiple Vitamins-Minerals (CENTRUM SILVER PO) Take 1 tablet by mouth daily.    Vladimir Faster Glycol-Propyl Glycol (SYSTANE) 0.4-0.3 % SOLN Apply 1 drop to eye every  morning.    . Probiotic Product (PROBIOTIC DAILY PO) Take 1 capsule by mouth daily.    . sertraline (ZOLOFT) 25 MG tablet TAKE 1 TABLET BY MOUTH EVERY MORNING 90 tablet 0  . tiZANidine (ZANAFLEX) 2 MG tablet TAKE 1 TABLET (2 MG TOTAL) BY MOUTH EVERY 8 (EIGHT) HOURS AS NEEDED FOR MUSCLE SPASMS. 30 tablet 1  . diclofenac (VOLTAREN) 25 MG EC tablet Take 25 mg by mouth daily.     Facility-Administered Medications Prior to Visit  Medication Dose Route Frequency Provider Last Rate Last Dose  . AEROCHAMBER PLUS FLO-VU MEDIUM MISC 1 each  1 each Other Once [place],  Bonnita Levan, MD        Allergies  Allergen Reactions  . Bee Venom Swelling    Yellow jackets, white and black face hornets, local swelling only  . Codeine Nausea And Vomiting  . Penicillins Itching and Swelling  . Lodine [Etodolac] Rash  . Tramadol Itching and Rash    Review of Systems  Constitutional: Negative for fever and malaise/fatigue.  HENT: Negative for congestion.   Eyes: Negative for blurred vision.  Respiratory: Negative for shortness of breath.   Cardiovascular: Negative for chest pain, palpitations and leg swelling.  Gastrointestinal: Negative for abdominal pain, blood in stool and nausea.  Genitourinary: Negative for dysuria and frequency.  Musculoskeletal: Positive for neck pain. Negative for falls.  Skin: Negative for rash.  Neurological: Negative for dizziness, loss of consciousness and headaches.  Endo/Heme/Allergies: Negative for environmental allergies.  Psychiatric/Behavioral: Negative for depression. The patient is not nervous/anxious.        Objective:    Physical Exam  Constitutional: She is oriented to person, place, and time. She appears well-developed and well-nourished. No distress.  HENT:  Head: Normocephalic and atraumatic.  Nose: Nose normal.  Eyes: Right eye exhibits no discharge. Left eye exhibits no discharge.  Neck: Normal range of motion. Neck supple.  Cardiovascular: Normal rate and  regular rhythm.   No murmur heard. Pulmonary/Chest: Effort normal and breath sounds normal.  Abdominal: Soft. Bowel sounds are normal. There is no tenderness.  Musculoskeletal: She exhibits no edema.  Neurological: She is alert and oriented to person, place, and time.  Skin: Skin is warm and dry.  Psychiatric: She has a normal mood and affect.  Nursing note and vitals reviewed.   BP 120/70 (BP Location: Left Arm, Patient Position: Sitting, Cuff Size: Normal)   Pulse 99   Temp 98.1 F (36.7 C) (Oral)   Resp 18   Wt 125 lb (56.7 kg)   LMP 01/13/2013 Comment: spotting-had benign endometrial biopsy   SpO2 96%   BMI 21.46 kg/m  Wt Readings from Last 3 Encounters:  04/04/17 125 lb (56.7 kg)  01/04/17 126 lb 6.4 oz (57.3 kg)  12/10/16 122 lb 3.2 oz (55.4 kg)   BP Readings from Last 3 Encounters:  04/04/17 120/70  01/04/17 126/68  12/10/16 116/72     Immunization History  Administered Date(s) Administered  . Influenza Whole 08/15/2012  . Influenza, High Dose Seasonal PF 08/19/2015  . Influenza-Unspecified 08/15/2014, 08/24/2016  . Pneumococcal Conjugate-13 12/11/2014  . Pneumococcal Polysaccharide-23 11/24/2011  . Td 02/25/2014  . Zoster 08/27/2011    Health Maintenance  Topic Date Due  . MAMMOGRAM  03/08/2017  . INFLUENZA VACCINE  06/15/2017  . TETANUS/TDAP  02/26/2024  . DEXA SCAN  Completed  . PNA vac Low Risk Adult  Completed    Lab Results  Component Value Date   WBC 5.0 01/04/2017   HGB 13.8 01/04/2017   HCT 41.0 01/04/2017   PLT 174.0 01/04/2017   GLUCOSE 99 01/04/2017   CHOL 178 01/04/2017   TRIG 198.0 (H) 01/04/2017   HDL 44.30 01/04/2017   LDLDIRECT 120.0 12/11/2014   LDLCALC 94 01/04/2017   ALT 7 01/04/2017   AST 25 01/04/2017   NA 132 (L) 01/04/2017   K 3.8 01/04/2017   CL 94 (L) 01/04/2017   CREATININE 0.70 01/04/2017   BUN 21 01/04/2017   CO2 33 (H) 01/04/2017   TSH 0.84 01/04/2017   INR 0.96 06/29/2016    Lab Results  Component Value  Date  TSH 0.84 01/04/2017   Lab Results  Component Value Date   WBC 5.0 01/04/2017   HGB 13.8 01/04/2017   HCT 41.0 01/04/2017   MCV 88.7 01/04/2017   PLT 174.0 01/04/2017   Lab Results  Component Value Date   NA 132 (L) 01/04/2017   K 3.8 01/04/2017   CO2 33 (H) 01/04/2017   GLUCOSE 99 01/04/2017   BUN 21 01/04/2017   CREATININE 0.70 01/04/2017   BILITOT 0.5 01/04/2017   ALKPHOS 45 01/04/2017   AST 25 01/04/2017   ALT 7 01/04/2017   PROT 6.6 01/04/2017   ALBUMIN 4.6 01/04/2017   CALCIUM 9.5 01/04/2017   ANIONGAP 6 07/19/2016   GFR 84.94 01/04/2017   Lab Results  Component Value Date   CHOL 178 01/04/2017   Lab Results  Component Value Date   HDL 44.30 01/04/2017   Lab Results  Component Value Date   LDLCALC 94 01/04/2017   Lab Results  Component Value Date   TRIG 198.0 (H) 01/04/2017   Lab Results  Component Value Date   CHOLHDL 4 01/04/2017   No results found for: HGBA1C       Assessment & Plan:   Problem List Items Addressed This Visit    Benign hypertensive heart disease without heart failure    Asymptomatic, no changes to therapy      HTN (hypertension) - Primary    Well controlled, no changes to meds. Encouraged heart healthy diet such as the DASH diet and exercise as tolerated.       Parkinson's disease ([place])    Stable and not dramatically affecting her ADLs follows with neurology      Arthropathy of cervical spine ([place])    S/p posterior cervical fusion and plates at V9-Y8 at [facility] in April 2018. She is now home with physical therapy and doing very well. Reports her pain improved immediately after surgery. Her wound is healing well and neurosurgery is pleased with her progress.      Hyperlipidemia    Encouraged heart healthy diet, increase exercise, avoid trans fats, consider a krill oil cap daily      Bladder cancer ([place])    Has done well with treatments.          I have discontinued Ms. Mangham's diclofenac. I  am also having her maintain her carbidopa-levodopa, EPINEPHrine, acetaminophen, Polyethyl Glycol-Propyl Glycol, Probiotic Product (PROBIOTIC DAILY PO), Multiple Vitamins-Minerals (CENTRUM SILVER PO), CALCIUM-MAGNESIUM-ZINC PO, Vitamin D, Coenzyme Q10 (COQ-10 PO), loratadine, tiZANidine, hydrochlorothiazide, diclofenac sodium, estradiol, medroxyPROGESTERone, sertraline, and diazepam. We will continue to administer AEROCHAMBER PLUS FLO-VU MEDIUM.  No orders of the defined types were placed in this encounter.   CMA served as Education administrator during this visit. History, Physical and Plan performed by medical provider. Documentation and orders reviewed and attested to.  Penni Homans, MD

## 2017-04-04 NOTE — Patient Instructions (Signed)

## 2017-04-05 ENCOUNTER — Encounter: Payer: Self-pay | Admitting: Family Medicine

## 2017-04-05 NOTE — Assessment & Plan Note (Signed)
Stable and not dramatically affecting her ADLs follows with neurology

## 2017-04-05 NOTE — Assessment & Plan Note (Signed)
S/p posterior cervical fusion and plates at M8-T9 at [facility] in April 2018. She is now home with physical therapy and doing very well. Reports her pain improved immediately after surgery. Her wound is healing well and neurosurgery is pleased with her progress.

## 2017-04-05 NOTE — Assessment & Plan Note (Signed)
Asymptomatic, no changes to therapy 

## 2017-04-05 NOTE — Assessment & Plan Note (Signed)
Encouraged heart healthy diet, increase exercise, avoid trans fats, consider a krill oil cap daily 

## 2017-04-05 NOTE — Assessment & Plan Note (Signed)
Has done well with treatments.

## 2017-04-05 NOTE — Assessment & Plan Note (Signed)
Well controlled, no changes to meds. Encouraged heart healthy diet such as the DASH diet and exercise as tolerated.  °

## 2017-04-06 DIAGNOSIS — Z1231 Encounter for screening mammogram for malignant neoplasm of breast: Secondary | ICD-10-CM | POA: Diagnosis not present

## 2017-04-06 LAB — HM MAMMOGRAPHY

## 2017-04-07 ENCOUNTER — Encounter: Payer: Self-pay | Admitting: Family Medicine

## 2017-04-14 ENCOUNTER — Encounter: Payer: Self-pay | Admitting: Obstetrics & Gynecology

## 2017-04-20 ENCOUNTER — Other Ambulatory Visit: Payer: Self-pay | Admitting: Family Medicine

## 2017-05-16 ENCOUNTER — Other Ambulatory Visit: Payer: Self-pay | Admitting: Family Medicine

## 2017-05-16 NOTE — Telephone Encounter (Signed)
Pharmacy request for diazepam  Last written: 03/14/17 Last ov: 04/04/17 Next ov: 09/05/17 Contract: 01/13/17 UDS: uds given 01/13/17 no results

## 2017-05-17 NOTE — Telephone Encounter (Signed)
rx faxed to [facility]

## 2017-05-30 ENCOUNTER — Other Ambulatory Visit: Payer: Self-pay | Admitting: Obstetrics & Gynecology

## 2017-05-30 NOTE — Telephone Encounter (Signed)
eScribe request from [facility] for refill on MEDROXYPROGESTERONE Last filled - 02/18/17, #45 X 3 RF (1/2 TAB DAILY) Last AEX - 02/18/17 Next AEX - 05/16/18 Last MMG (if hormonal request) - 04/06/17, Bi-Rads 1:  Negative, [facility]  RX denied today as new RX was sent at time of annual exam on 02/18/17.

## 2017-06-02 DIAGNOSIS — Z981 Arthrodesis status: Secondary | ICD-10-CM | POA: Diagnosis not present

## 2017-06-02 DIAGNOSIS — Z4789 Encounter for other orthopedic aftercare: Secondary | ICD-10-CM | POA: Diagnosis not present

## 2017-06-02 DIAGNOSIS — M5136 Other intervertebral disc degeneration, lumbar region: Secondary | ICD-10-CM | POA: Diagnosis not present

## 2017-06-02 DIAGNOSIS — M4316 Spondylolisthesis, lumbar region: Secondary | ICD-10-CM | POA: Diagnosis not present

## 2017-06-02 DIAGNOSIS — Z9889 Other specified postprocedural states: Secondary | ICD-10-CM | POA: Diagnosis not present

## 2017-06-06 ENCOUNTER — Other Ambulatory Visit: Payer: Self-pay | Admitting: *Deleted

## 2017-06-06 NOTE — Telephone Encounter (Signed)
Faxed refill request received from [facility] for MEDROXYPROGESTERONE Last filled by MD on 02/18/17, #45 X 3 RF (1/2 TAB DAILY) Last AEX - 02/18/17 Next AEX - 05/16/18 Last MMG (if hormonal medication request) - 04/06/17, Bi-Rads 1:  Negative  New RX sent at time of annual. RX denied.  Closing encounter.

## 2017-06-09 ENCOUNTER — Other Ambulatory Visit: Payer: Self-pay | Admitting: Family Medicine

## 2017-06-14 ENCOUNTER — Other Ambulatory Visit: Payer: Self-pay | Admitting: Family Medicine

## 2017-06-22 DIAGNOSIS — Z79899 Other long term (current) drug therapy: Secondary | ICD-10-CM | POA: Diagnosis not present

## 2017-06-22 DIAGNOSIS — G20 Parkinson's disease: Secondary | ICD-10-CM | POA: Diagnosis not present

## 2017-06-22 DIAGNOSIS — Z87891 Personal history of nicotine dependence: Secondary | ICD-10-CM | POA: Diagnosis not present

## 2017-07-05 DIAGNOSIS — C641 Malignant neoplasm of right kidney, except renal pelvis: Secondary | ICD-10-CM | POA: Diagnosis not present

## 2017-07-05 DIAGNOSIS — R310 Gross hematuria: Secondary | ICD-10-CM | POA: Diagnosis not present

## 2017-07-05 DIAGNOSIS — C672 Malignant neoplasm of lateral wall of bladder: Secondary | ICD-10-CM | POA: Diagnosis not present

## 2017-07-07 DIAGNOSIS — Z471 Aftercare following joint replacement surgery: Secondary | ICD-10-CM | POA: Diagnosis not present

## 2017-07-07 DIAGNOSIS — Z96642 Presence of left artificial hip joint: Secondary | ICD-10-CM | POA: Diagnosis not present

## 2017-07-07 DIAGNOSIS — M1612 Unilateral primary osteoarthritis, left hip: Secondary | ICD-10-CM | POA: Diagnosis not present

## 2017-07-07 DIAGNOSIS — M160 Bilateral primary osteoarthritis of hip: Secondary | ICD-10-CM | POA: Diagnosis not present

## 2017-07-07 DIAGNOSIS — M1611 Unilateral primary osteoarthritis, right hip: Secondary | ICD-10-CM | POA: Diagnosis not present

## 2017-07-20 ENCOUNTER — Telehealth: Payer: Self-pay | Admitting: *Deleted

## 2017-07-20 NOTE — Telephone Encounter (Signed)
Received Medical/Surgical Clearance Form from Dr. Anne Fu office, Sx scheduled for Right hip: THA on 09/28/17; called and spoke with patient who has appt scheduled on 09/05/17 at 2:00pm with PCP, needed 30-minute appt, so we changed the time to 2:30pm; forwarded paperwork to provider's assistant to Hold until patient comes in for Medical clearance appointment/SLS 09/05

## 2017-07-20 NOTE — Telephone Encounter (Signed)
Perfect thanks

## 2017-08-14 ENCOUNTER — Other Ambulatory Visit: Payer: Self-pay | Admitting: Family Medicine

## 2017-08-27 DIAGNOSIS — Z23 Encounter for immunization: Secondary | ICD-10-CM | POA: Diagnosis not present

## 2017-09-04 ENCOUNTER — Other Ambulatory Visit: Payer: Self-pay | Admitting: Family Medicine

## 2017-09-05 ENCOUNTER — Other Ambulatory Visit: Payer: Self-pay | Admitting: Family Medicine

## 2017-09-05 ENCOUNTER — Ambulatory Visit (INDEPENDENT_AMBULATORY_CARE_PROVIDER_SITE_OTHER): Payer: Medicare Other | Admitting: Family Medicine

## 2017-09-05 ENCOUNTER — Ambulatory Visit: Payer: BLUE CROSS/BLUE SHIELD | Admitting: Family Medicine

## 2017-09-05 DIAGNOSIS — I10 Essential (primary) hypertension: Secondary | ICD-10-CM | POA: Diagnosis not present

## 2017-09-05 DIAGNOSIS — Z01810 Encounter for preprocedural cardiovascular examination: Secondary | ICD-10-CM

## 2017-09-05 DIAGNOSIS — G20 Parkinson's disease: Secondary | ICD-10-CM

## 2017-09-05 DIAGNOSIS — M169 Osteoarthritis of hip, unspecified: Secondary | ICD-10-CM

## 2017-09-05 DIAGNOSIS — C679 Malignant neoplasm of bladder, unspecified: Secondary | ICD-10-CM | POA: Diagnosis not present

## 2017-09-05 DIAGNOSIS — R9431 Abnormal electrocardiogram [ECG] [EKG]: Secondary | ICD-10-CM

## 2017-09-05 DIAGNOSIS — E785 Hyperlipidemia, unspecified: Secondary | ICD-10-CM

## 2017-09-05 MED ORDER — DIAZEPAM 5 MG PO TABS: 5.0000 mg | ORAL_TABLET | Freq: Two times a day (BID) | ORAL | 3 refills | Status: DC | PRN

## 2017-09-05 NOTE — Assessment & Plan Note (Signed)
Tremor has worsened some. Is following with neurology

## 2017-09-05 NOTE — Assessment & Plan Note (Signed)
Is looking forward her THR due to her level of pain and debility. She will discuss risks and benefits with surgeon. She is cleared medically once she is cleared by cardiology. Due to some changes in her EKG will send to cardiology for consideration before clearing completely. Labs ordered today

## 2017-09-05 NOTE — Progress Notes (Signed)
Subjective:  I acted as a Education administrator for Dr. Charlett Blake. [place], [place]  Patient ID: Stephanie [name], female    DOB: 18-Apr-1932, 83 y.o.   MRN: [id number]  No chief complaint on file.   HPI  Patient is in today for a surgical clearance and follow up on chronic medical concerns. She feels well today. No recent febrile illness or hospitalizations. She is struggling with significant right hip pain and she is hoping to undergo THR with Dr Maureen Ralphs on 09/28/17. No recent falls or injury. Denies CP/palp/SOB/HA/congestion/fevers/GI or GU c/o. Taking meds as prescribed  Patient Care Team: Mosie Lukes, MD as PCP - General (Family Medicine) Megan Salon, MD as Consulting Physician (Gynecology) Eldridge Abrahams, MD as Referring Physician (Neurology) Alexis Frock, MD as Consulting Physician (Urology) Katy Fitch, Darlina Guys, MD as Consulting Physician (Ophthalmology) Corene Cornea, DC (Chiropractic Medicine) Jari Pigg, MD as Consulting Physician (Dermatology)   Past Medical History:  Diagnosis Date  . Arthropathy of cervical spine ([place]) 11/27/2012   Right  Xray at [facility]  On 04/08/2016  AP, lateral, and lateral flexion and extension views of the cervical spine are submitted for evaluation.   No acute fracture identified in the cervical spine.   There is redemonstration of approximately 2 mm of C3 on C4 anterolisthesis in neutral which slightly increases in flexion relative to neutral and extension. Slight C5 on C6 retrolisthesis does not change i  . Benign hypertension without congestive heart failure   . Benign mole    right hcets mole, bleeds when dried with towel  . Bilateral dry eyes   . Bladder cancer ([place])   . Bladder cancer ([place]) 10/17/2016  . Cervical spondylosis   . Cervicalgia   . Depression with anxiety 10/17/2016  . Gross hematuria   . History of kidney stones    1970's  . Lumbar stenosis   . Malignant tumor of urinary bladder ([place]) 07/2016   Pre-stage I  . OA (osteoarthritis)     . Parkinson disease [facility]) dx 2012   neurologist-  dr siddiqui at [facility]    Past Surgical History:  Procedure Laterality Date  . BIOPSY MASS LEFT FIRST METACARPAL   06/23/2006   benign  . CATARACT EXTRACTION W/ INTRAOCULAR LENS  IMPLANT, BILATERAL  1999 and 2003  . CYSTOSCOPY W/ RETROGRADES Bilateral 09/22/2016   Procedure: CYSTOSCOPY WITH RETROGRADE PYELOGRAM;  Surgeon: Alexis Frock, MD;  Location: [facility];  Service: Urology;  Laterality: Bilateral;  . D & C HYSTEROSCOPY W/ RESECTION POLYP  07/11/2000  . DILATION AND CURETTAGE OF UTERUS  1960's  . EXCISION CYST AND DEBRIDEMENT RIGHT WIRST AND REMOVAL [place] BODY  01/09/2009  . LAPAROSCOPY  yrs ago   infertility   . METACARPOPHALANGEAL JOINT ARTHRODESIS Right 05/25/1996  . TONSILLECTOMY AND ADENOIDECTOMY  child  . TOTAL HIP ARTHROPLASTY Left 07/07/2016   Procedure: LEFT TOTAL HIP ARTHROPLASTY ANTERIOR APPROACH;  Surgeon: Gaynelle Arabian, MD;  Location: [facility];  Service: Orthopedics;  Laterality: Left;  . TRANSURETHRAL RESECTION OF BLADDER TUMOR N/A 09/22/2016   Procedure: TRANSURETHRAL RESECTION OF BLADDER TUMOR (TURBT);  Surgeon: Alexis Frock, MD;  [facility];  Service: Urology;  Laterality: N/A;    Family History  Problem Relation Age of Onset  . Arthritis Mother   . Hypertension Mother   . Deep vein thrombosis Mother        recurrent, secondary to Bleeding disorder  . Arthritis Father   . Stroke Father   .  Diabetes Sister   . Heart disease Sister   . Obesity Sister   . Arthritis Sister   . Hypertension Sister   . Heart disease Maternal Grandmother   . Heart disease Maternal Grandfather   . Peripheral vascular disease Paternal Grandmother        s/p leg amputation  . Stroke Paternal Grandfather     Social History   Social History  . Marital status: Widowed    Spouse name: N/A  . Number of children: N/A  . Years of education: N/A   Occupational History  . Not on  file.   Social History Main Topics  . Smoking status: Former Smoker    Years: 10.00    Quit date: 05/04/1964  . Smokeless tobacco: Never Used  . Alcohol use 2.0 oz/week    4 Standard drinks or equivalent per week     Comment: social  . Drug use: No  . Sexual activity: No   Other Topics Concern  . Not on file   Social History Narrative  . No narrative on file    Outpatient Medications Prior to Visit  Medication Sig Dispense Refill  . acetaminophen (TYLENOL) 500 MG tablet Take 1,000 mg by mouth every 6 (six) hours as needed for moderate pain.    Marland Kitchen CALCIUM-MAGNESIUM-ZINC PO Take by mouth daily.    . carbidopa-levodopa (SINEMET IR) 25-100 MG per tablet Take 1 tablet by mouth 3 (three) times daily.     . Cholecalciferol (VITAMIN D) 2000 units CAPS Take 1 capsule by mouth daily.    . Coenzyme Q10 (COQ-10 PO) Take 300 mg by mouth daily.    Marland Kitchen EPINEPHrine 0.3 mg/0.3 mL IJ SOAJ injection Inject 0.3 mLs (0.3 mg total) into the muscle once. 2 Device 2  . estradiol (ESTRACE) 0.5 MG tablet TAKE 1/2 TABLET BY MOUTH EVERY DAY--  takes in am 45 tablet 3  . hydrochlorothiazide (HYDRODIURIL) 25 MG tablet TAKE 1 1/2 TABLETS BY MOUTH DAILY 45 tablet 0  . loratadine (CLARITIN) 10 MG tablet Take 10 mg by mouth daily.    . medroxyPROGESTERone (PROVERA) 2.5 MG tablet Take 0.5 tablets (1.25 mg total) by mouth daily. 45 tablet 3  . Multiple Vitamins-Minerals (CENTRUM SILVER PO) Take 1 tablet by mouth daily.    Vladimir Faster Glycol-Propyl Glycol (SYSTANE) 0.4-0.3 % SOLN Apply 1 drop to eye every morning.    . Probiotic Product (PROBIOTIC DAILY PO) Take 1 capsule by mouth daily.    . sertraline (ZOLOFT) 25 MG tablet TAKE 1 TABLET BY MOUTH EVERY MORNING 90 tablet 0  . diazepam (VALIUM) 5 MG tablet TAKE 1 TABLET BY MOUTH TWICE A DAY 60 tablet 1  . diclofenac sodium (VOLTAREN) 1 % GEL as needed.    Marland Kitchen tiZANidine (ZANAFLEX) 2 MG tablet TAKE 1 TABLET (2 MG TOTAL) BY MOUTH EVERY 8 (EIGHT) HOURS AS NEEDED FOR MUSCLE  SPASMS. 30 tablet 1  . AEROCHAMBER PLUS FLO-VU MEDIUM MISC 1 each      No facility-administered medications prior to visit.     Allergies  Allergen Reactions  . Bee Venom Swelling    Yellow jackets, white and black face hornets, local swelling only  . Codeine Nausea And Vomiting  . Penicillins Itching and Swelling  . Lodine [Etodolac] Rash  . Tramadol Itching and Rash    Review of Systems  Constitutional: Negative for fever and malaise/fatigue.  HENT: Negative for congestion.   Eyes: Negative for blurred vision.  Respiratory: Negative for shortness of breath.  Cardiovascular: Negative for chest pain, palpitations and leg swelling.  Gastrointestinal: Negative for abdominal pain, blood in stool and nausea.  Genitourinary: Negative for dysuria and frequency.  Musculoskeletal: Positive for joint pain. Negative for falls.  Skin: Negative for rash.  Neurological: Positive for tremors. Negative for dizziness, loss of consciousness and headaches.  Endo/Heme/Allergies: Negative for environmental allergies.  Psychiatric/Behavioral: Negative for depression. The patient is not nervous/anxious.        Objective:    Physical Exam  Constitutional: She is oriented to person, place, and time. She appears well-developed and well-nourished. No distress.  HENT:  Head: Normocephalic and atraumatic.  Nose: Nose normal.  Eyes: Right eye exhibits no discharge. Left eye exhibits no discharge.  Neck: Normal range of motion. Neck supple.  Cardiovascular: Normal rate and regular rhythm.   No murmur heard. Pulmonary/Chest: Effort normal and breath sounds normal.  Abdominal: Soft. Bowel sounds are normal. There is no tenderness.  Musculoskeletal: She exhibits no edema.  Neurological: She is alert and oriented to person, place, and time.  Skin: Skin is warm and dry.  Psychiatric: She has a normal mood and affect.  Nursing note and vitals reviewed.   LMP 01/13/2013 Comment: spotting-had benign  endometrial biopsy  Wt Readings from Last 3 Encounters:  04/04/17 125 lb (56.7 kg)  01/04/17 126 lb 6.4 oz (57.3 kg)  12/10/16 122 lb 3.2 oz (55.4 kg)   BP Readings from Last 3 Encounters:  04/04/17 120/70  01/04/17 126/68  12/10/16 116/72     Immunization History  Administered Date(s) Administered  . Influenza Whole 08/15/2012  . Influenza, High Dose Seasonal PF 08/19/2015, 08/29/2017  . Influenza-Unspecified 08/15/2014, 08/24/2016  . Pneumococcal Conjugate-13 12/11/2014  . Pneumococcal Polysaccharide-23 11/24/2011  . Td 02/25/2014  . Zoster 08/27/2011    Health Maintenance  Topic Date Due  . MAMMOGRAM  04/06/2018  . TETANUS/TDAP  02/26/2024  . INFLUENZA VACCINE  Addressed  . DEXA SCAN  Completed  . PNA vac Low Risk Adult  Completed    Lab Results  Component Value Date   WBC 5.0 01/04/2017   HGB 13.8 01/04/2017   HCT 41.0 01/04/2017   PLT 174.0 01/04/2017   GLUCOSE 99 01/04/2017   CHOL 178 01/04/2017   TRIG 198.0 (H) 01/04/2017   HDL 44.30 01/04/2017   LDLDIRECT 120.0 12/11/2014   LDLCALC 94 01/04/2017   ALT 7 01/04/2017   AST 25 01/04/2017   NA 132 (L) 01/04/2017   K 3.8 01/04/2017   CL 94 (L) 01/04/2017   CREATININE 0.70 01/04/2017   BUN 21 01/04/2017   CO2 33 (H) 01/04/2017   TSH 0.84 01/04/2017   INR 0.96 06/29/2016    Lab Results  Component Value Date   TSH 0.84 01/04/2017   Lab Results  Component Value Date   WBC 5.0 01/04/2017   HGB 13.8 01/04/2017   HCT 41.0 01/04/2017   MCV 88.7 01/04/2017   PLT 174.0 01/04/2017   Lab Results  Component Value Date   NA 132 (L) 01/04/2017   K 3.8 01/04/2017   CO2 33 (H) 01/04/2017   GLUCOSE 99 01/04/2017   BUN 21 01/04/2017   CREATININE 0.70 01/04/2017   BILITOT 0.5 01/04/2017   ALKPHOS 45 01/04/2017   AST 25 01/04/2017   ALT 7 01/04/2017   PROT 6.6 01/04/2017   ALBUMIN 4.6 01/04/2017   CALCIUM 9.5 01/04/2017   ANIONGAP 6 07/19/2016   GFR 84.94 01/04/2017   Lab Results  Component Value  Date  CHOL 178 01/04/2017   Lab Results  Component Value Date   HDL 44.30 01/04/2017   Lab Results  Component Value Date   LDLCALC 94 01/04/2017   Lab Results  Component Value Date   TRIG 198.0 (H) 01/04/2017   Lab Results  Component Value Date   CHOLHDL 4 01/04/2017   No results found for: HGBA1C       Assessment & Plan:   Problem List Items Addressed This Visit    HTN (hypertension)    Well controlled, no changes to meds. Encouraged heart healthy diet such as the DASH diet and exercise as tolerated.       Relevant Orders   Ambulatory referral to Cardiology   Parkinson's disease [facility])    Tremor has worsened some. Is following with neurology      Hyperlipidemia    Encouraged heart healthy diet, increase exercise, avoid trans fats, consider a krill oil cap daily      Relevant Orders   Ambulatory referral to Cardiology   OA (osteoarthritis) of hip    Is looking forward her THR due to her level of pain and debility. She will discuss risks and benefits with surgeon. She is cleared medically once she is cleared by cardiology. Due to some changes in her EKG will send to cardiology for consideration before clearing completely. Labs ordered today      Relevant Medications   diclofenac (VOLTAREN) 50 MG EC tablet   Bladder cancer [facility])    Following with urology and doing well      Relevant Medications   diazepam (VALIUM) 5 MG tablet    Other Visit Diagnoses    Abnormal EKG    -  Primary   Relevant Orders   EKG 12-Lead (Completed)   Ambulatory referral to Cardiology   Preop cardiovascular exam       Relevant Orders   Ambulatory referral to Cardiology      I have discontinued Ms. Camino's tiZANidine and diclofenac sodium. I have also changed her diazepam. Additionally, I am having her maintain her carbidopa-levodopa, EPINEPHrine, acetaminophen, Polyethyl Glycol-Propyl Glycol, Probiotic Product (PROBIOTIC DAILY PO), Multiple Vitamins-Minerals (CENTRUM SILVER  PO), CALCIUM-MAGNESIUM-ZINC PO, Vitamin D, Coenzyme Q10 (COQ-10 PO), loratadine, estradiol, medroxyPROGESTERone, hydrochlorothiazide, sertraline, and diclofenac. We will stop administering AEROCHAMBER PLUS FLO-VU MEDIUM.  Meds ordered this encounter  Medications  . diazepam (VALIUM) 5 MG tablet    Sig: Take 1 tablet (5 mg total) by mouth every 12 (twelve) hours as needed for anxiety.    Dispense:  60 tablet    Refill:  3    Not to exceed 5 additional fills before 09/11/2017  . diclofenac (VOLTAREN) 50 MG EC tablet    Sig: Take 1 tablet (50 mg total) by mouth 2 (two) times daily as needed.    CMA served as Education administrator during this visit. History, Physical and Plan performed by medical provider. Documentation and orders reviewed and attested to.  Penni Homans, MD

## 2017-09-05 NOTE — Telephone Encounter (Signed)
Requesting:Valium Contract:yes UDS:no Last OV:04/04/17 Next OV:10/04/17 Last Refill:05/16/17  #[id number]   Please advise

## 2017-09-05 NOTE — Assessment & Plan Note (Signed)
Well controlled, no changes to meds. Encouraged heart healthy diet such as the DASH diet and exercise as tolerated.  °

## 2017-09-05 NOTE — Assessment & Plan Note (Signed)
Encouraged heart healthy diet, increase exercise, avoid trans fats, consider a krill oil cap daily 

## 2017-09-05 NOTE — Patient Instructions (Addendum)
Hypertension Hypertension, commonly called high blood pressure, is when the force of blood pumping through the arteries is too strong. The arteries are the blood vessels that carry blood from the heart throughout the body. Hypertension forces the heart to work harder to pump blood and may cause arteries to become narrow or stiff. Having untreated or uncontrolled hypertension can cause heart attacks, strokes, kidney disease, and other problems. A blood pressure reading consists of a higher number over a lower number. Ideally, your blood pressure should be below 120/80. The first ("top") number is called the systolic pressure. It is a measure of the pressure in your arteries as your heart beats. The second ("bottom") number is called the diastolic pressure. It is a measure of the pressure in your arteries as the heart relaxes. What are the causes? The cause of this condition is not known. What increases the risk? Some risk factors for high blood pressure are under your control. Others are not. Factors you can change  Smoking.  Having type 2 diabetes mellitus, high cholesterol, or both.  Not getting enough exercise or physical activity.  Being overweight.  Having too much fat, sugar, calories, or salt (sodium) in your diet.  Drinking too much alcohol. Factors that are difficult or impossible to change  Having chronic kidney disease.  Having a family history of high blood pressure.  Age. Risk increases with age.  Race. You may be at higher risk if you are African-American.  Gender. Men are at higher risk than women before age 45. After age 65, women are at higher risk than men.  Having obstructive sleep apnea.  Stress. What are the signs or symptoms? Extremely high blood pressure (hypertensive crisis) may cause:  Headache.  Anxiety.  Shortness of breath.  Nosebleed.  Nausea and vomiting.  Severe chest pain.  Jerky movements you cannot control (seizures).  How is this  diagnosed? This condition is diagnosed by measuring your blood pressure while you are seated, with your arm resting on a surface. The cuff of the blood pressure monitor will be placed directly against the skin of your upper arm at the level of your heart. It should be measured at least twice using the same arm. Certain conditions can cause a difference in blood pressure between your right and left arms. Certain factors can cause blood pressure readings to be lower or higher than normal (elevated) for a short period of time:  When your blood pressure is higher when you are in a health care provider's office than when you are at home, this is called white coat hypertension. Most people with this condition do not need medicines.  When your blood pressure is higher at home than when you are in a health care provider's office, this is called masked hypertension. Most people with this condition may need medicines to control blood pressure.  If you have a high blood pressure reading during one visit or you have normal blood pressure with other risk factors:  You may be asked to return on a different day to have your blood pressure checked again.  You may be asked to monitor your blood pressure at home for 1 week or longer.  If you are diagnosed with hypertension, you may have other blood or imaging tests to help your health care provider understand your overall risk for other conditions. How is this treated? This condition is treated by making healthy lifestyle changes, such as eating healthy foods, exercising more, and reducing your alcohol intake. Your   health care provider may prescribe medicine if lifestyle changes are not enough to get your blood pressure under control, and if:  Your systolic blood pressure is above 130.  Your diastolic blood pressure is above 80.  Your personal target blood pressure may vary depending on your medical conditions, your age, and other factors. Follow these  instructions at home: Eating and drinking  Eat a diet that is high in fiber and potassium, and low in sodium, added sugar, and fat. An example eating plan is called the DASH (Dietary Approaches to Stop Hypertension) diet. To eat this way: ? Eat plenty of fresh fruits and vegetables. Try to fill half of your plate at each meal with fruits and vegetables. ? Eat whole grains, such as whole wheat pasta, brown rice, or whole grain bread. Fill about one quarter of your plate with whole grains. ? Eat or drink low-fat dairy products, such as skim milk or low-fat yogurt. ? Avoid fatty cuts of meat, processed or cured meats, and poultry with skin. Fill about one quarter of your plate with lean proteins, such as fish, chicken without skin, beans, eggs, and tofu. ? Avoid premade and processed foods. These tend to be higher in sodium, added sugar, and fat.  Reduce your daily sodium intake. Most people with hypertension should eat less than 1,500 mg of sodium a day.  Limit alcohol intake to no more than 1 drink a day for nonpregnant women and 2 drinks a day for men. One drink equals 12 oz of beer, 5 oz of wine, or 1 oz of hard liquor. Lifestyle  Work with your health care provider to maintain a healthy body weight or to lose weight. Ask what an ideal weight is for you.  Get at least 30 minutes of exercise that causes your heart to beat faster (aerobic exercise) most days of the week. Activities may include walking, swimming, or biking.  Include exercise to strengthen your muscles (resistance exercise), such as pilates or lifting weights, as part of your weekly exercise routine. Try to do these types of exercises for 30 minutes at least 3 days a week.  Do not use any products that contain nicotine or tobacco, such as cigarettes and e-cigarettes. If you need help quitting, ask your health care provider.  Monitor your blood pressure at home as told by your health care provider.  Keep all follow-up visits as  told by your health care provider. This is important. Medicines  Take over-the-counter and prescription medicines only as told by your health care provider. Follow directions carefully. Blood pressure medicines must be taken as prescribed.  Do not skip doses of blood pressure medicine. Doing this puts you at risk for problems and can make the medicine less effective.  Ask your health care provider about side effects or reactions to medicines that you should watch for. Contact a health care provider if:  You think you are having a reaction to a medicine you are taking.  You have headaches that keep coming back (recurring).  You feel dizzy.  You have swelling in your ankles.  You have trouble with your vision. Get help right away if:  You develop a severe headache or confusion.  You have unusual weakness or numbness.  You feel faint.  You have severe pain in your chest or abdomen.  You vomit repeatedly.  You have trouble breathing. Summary  Hypertension is when the force of blood pumping through your arteries is too strong. If this condition is not   controlled, it may put you at risk for serious complications.  Your personal target blood pressure may vary depending on your medical conditions, your age, and other factors. For most people, a normal blood pressure is less than 120/80.  Hypertension is treated with lifestyle changes, medicines, or a combination of both. Lifestyle changes include weight loss, eating a healthy, low-sodium diet, exercising more, and limiting alcohol. This information is not intended to replace advice given to you by your health care provider. Make sure you discuss any questions you have with your health care provider. Document Released: 11/01/2005 Document Revised: 09/29/2016 Document Reviewed: 09/29/2016 Elsevier Interactive Patient Education  [employer]. In your present state of health, do you have any difficulty performing the following  activities: 10/29/2016 09/22/2016  Hearing? Y Y  Comment - slight decrease  Vision? N N  Difficulty concentrating or making decisions? N N  Walking or climbing stairs? N N  Dressing or bathing? N Y  Doing errands, shopping? N -  Preparing Food and eating ? N -  Using the Toilet? N -  In the past six months, have you accidently leaked urine? N -  Do you have problems with loss of bowel control? N -  Managing your Medications? N -  Managing your Finances? N -  Housekeeping or managing your Housekeeping? N -  Some recent data might be hidden    Fall Risk  10/29/2016 07/14/2016 06/29/2016 06/11/2015 02/25/2014  Falls in the past year? No No No No No  Comment - Emmi Telephone Survey: data to providers prior to load - - -    PHQ 2/9 Scores 10/29/2016 06/29/2016 06/11/2015 02/25/2014  PHQ - 2 Score 1 6 0 0  PHQ- 9 Score - 15 - -    No flowsheet data found.  MMSE - Mini Mental State Exam 10/29/2016  Orientation to time 5  Orientation to Place 5  Registration 3  Attention/ Calculation 5  Recall 3  Language- name 2 objects 2  Language- repeat 1  Language- follow 3 step command 3  Language- read & follow direction 1  Write a sentence 1  Copy design 0  Total score 29    @SCRHRVIS @     Goals    . Decrease neck pain    . Increase physical activity

## 2017-09-05 NOTE — Assessment & Plan Note (Signed)
Following with urology and doing well

## 2017-09-06 DIAGNOSIS — M5030 Other cervical disc degeneration, unspecified cervical region: Secondary | ICD-10-CM | POA: Diagnosis not present

## 2017-09-06 DIAGNOSIS — M4312 Spondylolisthesis, cervical region: Secondary | ICD-10-CM | POA: Diagnosis not present

## 2017-09-06 DIAGNOSIS — Z981 Arthrodesis status: Secondary | ICD-10-CM | POA: Diagnosis not present

## 2017-09-06 DIAGNOSIS — M542 Cervicalgia: Secondary | ICD-10-CM | POA: Diagnosis not present

## 2017-09-09 NOTE — Progress Notes (Signed)
Please place orders in EPIC as patient is being scheduled for a pre-op appointment! Thank you! 

## 2017-09-11 ENCOUNTER — Ambulatory Visit: Payer: Self-pay | Admitting: Orthopedic Surgery

## 2017-09-13 ENCOUNTER — Ambulatory Visit: Payer: Self-pay | Admitting: Orthopedic Surgery

## 2017-09-14 ENCOUNTER — Ambulatory Visit (HOSPITAL_BASED_OUTPATIENT_CLINIC_OR_DEPARTMENT_OTHER)
Admission: RE | Admit: 2017-09-14 | Discharge: 2017-09-14 | Disposition: A | Discharge status: Home / Self Care | Payer: Medicare Other | Source: Ambulatory Visit | Attending: Cardiology | Admitting: Cardiology

## 2017-09-14 ENCOUNTER — Encounter: Payer: Self-pay | Admitting: Cardiology

## 2017-09-14 ENCOUNTER — Ambulatory Visit (INDEPENDENT_AMBULATORY_CARE_PROVIDER_SITE_OTHER): Payer: Medicare Other | Admitting: Cardiology

## 2017-09-14 ENCOUNTER — Other Ambulatory Visit: Payer: Self-pay | Admitting: Family Medicine

## 2017-09-14 VITALS — BP 120/72 | HR 84 | Resp 10 | Ht 63.0 in | Wt 124.2 lb

## 2017-09-14 DIAGNOSIS — E785 Hyperlipidemia, unspecified: Secondary | ICD-10-CM

## 2017-09-14 DIAGNOSIS — I10 Essential (primary) hypertension: Secondary | ICD-10-CM | POA: Diagnosis not present

## 2017-09-14 DIAGNOSIS — C679 Malignant neoplasm of bladder, unspecified: Secondary | ICD-10-CM

## 2017-09-14 DIAGNOSIS — I119 Hypertensive heart disease without heart failure: Secondary | ICD-10-CM

## 2017-09-14 LAB — ECHOCARDIOGRAM COMPLETE
HEIGHTINCHES: 63 in
Weight: 1987.2 oz

## 2017-09-14 NOTE — Progress Notes (Signed)
  Echocardiogram 2D Echocardiogram has been performed.  Stephanie [name] 09/14/2017, 3:48 PM

## 2017-09-14 NOTE — Patient Instructions (Signed)
Medication Instructions:  Your physician recommends that you continue on your current medications as directed. Please refer to the Current Medication list given to you today.  1. Avoid all over-the-counter antihistamines except Claritin/Loratadine and Zyrtec/Cetrizine. 2. Avoid all combination including cold sinus allergies flu decongestant and sleep medications 3. You can use Robitussin DM Mucinex and Mucinex DM for cough. 4. can use Tylenol aspirin ibuprofen and naproxen but no combinations such as sleep or sinus.  Labwork: None    Testing/Procedures: Your physician has requested that you have an echocardiogram. Echocardiography is a painless test that uses sound waves to create images of your heart. It provides your doctor with information about the size and shape of your heart and how well your heart's chambers and valves are working. This procedure takes approximately one hour. There are no restrictions for this procedure.  Follow-Up: Your physician recommends that you schedule a follow-up appointment in: 2 months   Any Other Special Instructions Will Be Listed Below (If Applicable).  Please note that any paperwork needing to be filled out by the provider will need to be addressed at the front desk prior to seeing the provider. Please note that any paperwork FMLA, Disability or other documents regarding health condition is subject to a $25.00 charge that must be received prior to completion of paperwork in the form of a money order or check.    If you need a refill on your cardiac medications before your next appointment, please call your pharmacy.

## 2017-09-14 NOTE — Progress Notes (Signed)
Cardiology Consultation:    Date:  09/14/2017   ID:  Stephanie [name], DOB 08/08/1932, MRN [id number]  PCP:  Mosie Lukes, MD  Cardiologist:  Jenne Campus, MD   Referring MD: Mosie Lukes, MD   Chief Complaint  Patient presents with  . Abnormal ECG  . Pre-op Exam    Hip Replacement on Nov. 14  I need hip surgery in 1 to be sure everything is fine from heart point of view  History of Present Illness:    Stephanie [name] is a 83 y.o. female who is being seen today for the evaluation of cardiac issue before surgery at the request of Mosie Lukes, MD.  Patient had risk factors for coronary artery disease that include hypertension, dyslipidemia.  Does not have any typical symptoms suggest coronary artery disease.  However, EKG was done which showed some issue with potentially right atrium enlargement this was she was referred to [place].  She is doing fine she did have a left hip surgery done about a year ago and was no trouble in either April she underwent some upper neck surgery and there was no difficulties.  She exercise on a regular basis she walks every day for 1 mile and it takes her 20 minutes to do it.  And she has no difficulty she can climb stairs with no shortness of breath no tightness squeezing pressure burning chest I have told me that she is doing well from her age.  Past Medical History:  Diagnosis Date  . Arthropathy of cervical spine ([place]) 11/27/2012   Right  Xray at [facility]  On 04/08/2016  AP, lateral, and lateral flexion and extension views of the cervical spine are submitted for evaluation.   No acute fracture identified in the cervical spine.   There is redemonstration of approximately 2 mm of C3 on C4 anterolisthesis in neutral which slightly increases in flexion relative to neutral and extension. Slight C5 on C6 retrolisthesis does not change i  . Benign hypertension without congestive heart failure   . Benign mole    right hcets mole, bleeds when dried with towel    . Bilateral dry eyes   . Bladder cancer ([place])   . Bladder cancer ([place]) 10/17/2016  . Cervical spondylosis   . Cervicalgia   . Depression with anxiety 10/17/2016  . Gross hematuria   . History of kidney stones    1970's  . Lumbar stenosis   . Malignant tumor of urinary bladder ([place]) 07/2016   Pre-stage I  . OA (osteoarthritis)   . Parkinson disease [facility]) dx 2012   neurologist-  dr siddiqui at [facility]    Past Surgical History:  Procedure Laterality Date  . BIOPSY MASS LEFT FIRST METACARPAL   06/23/2006   benign  . CATARACT EXTRACTION W/ INTRAOCULAR LENS  IMPLANT, BILATERAL  1999 and 2003  . CYSTOSCOPY W/ RETROGRADES Bilateral 09/22/2016   Procedure: CYSTOSCOPY WITH RETROGRADE PYELOGRAM;  Surgeon: Alexis Frock, MD;  Location: [facility];  Service: Urology;  Laterality: Bilateral;  . D & C HYSTEROSCOPY W/ RESECTION POLYP  07/11/2000  . DILATION AND CURETTAGE OF UTERUS  1960's  . EXCISION CYST AND DEBRIDEMENT RIGHT WIRST AND REMOVAL [place] BODY  01/09/2009  . LAPAROSCOPY  yrs ago   infertility   . METACARPOPHALANGEAL JOINT ARTHRODESIS Right 05/25/1996  . TONSILLECTOMY AND ADENOIDECTOMY  child  . TOTAL HIP ARTHROPLASTY Left 07/07/2016   Procedure: LEFT TOTAL HIP ARTHROPLASTY ANTERIOR APPROACH;  Surgeon:  Gaynelle Arabian, MD;  Location: [facility];  Service: Orthopedics;  Laterality: Left;  . TRANSURETHRAL RESECTION OF BLADDER TUMOR N/A 09/22/2016   Procedure: TRANSURETHRAL RESECTION OF BLADDER TUMOR (TURBT);  Surgeon: Alexis Frock, MD;  Location: [facility];  Service: Urology;  Laterality: N/A;    Current Medications: Current Meds  Medication Sig  . acetaminophen (TYLENOL) 500 MG tablet Take 1,000 mg by mouth every 6 (six) hours as needed for moderate pain.  . carbidopa-levodopa (SINEMET IR) 25-100 MG per tablet Take 1 tablet by mouth 3 (three) times daily.   . Cholecalciferol (VITAMIN D) 2000 units CAPS Take 1 capsule by mouth daily.  . Coenzyme  Q10 (COQ-10 PO) Take 300 mg by mouth daily.  . diazepam (VALIUM) 5 MG tablet Take 1 tablet (5 mg total) by mouth every 12 (twelve) hours as needed for anxiety.  Marland Kitchen EPINEPHrine 0.3 mg/0.3 mL IJ SOAJ injection Inject 0.3 mLs (0.3 mg total) into the muscle once.  Marland Kitchen estradiol (ESTRACE) 0.5 MG tablet TAKE 1/2 TABLET BY MOUTH EVERY DAY--  takes in am  . hydrochlorothiazide (HYDRODIURIL) 25 MG tablet TAKE 1 1/2 TABLETS BY MOUTH DAILY  . loratadine (CLARITIN) 10 MG tablet Take 10 mg by mouth daily.  . medroxyPROGESTERone (PROVERA) 2.5 MG tablet Take 0.5 tablets (1.25 mg total) by mouth daily.  . Multiple Vitamins-Minerals (CENTRUM SILVER PO) Take 1 tablet by mouth daily.  Vladimir Faster Glycol-Propyl Glycol (SYSTANE) 0.4-0.3 % SOLN Apply 1 drop to eye every morning.  . Probiotic Product (PROBIOTIC DAILY PO) Take 1 capsule by mouth daily.  . sertraline (ZOLOFT) 25 MG tablet TAKE 1 TABLET BY MOUTH EVERY MORNING     Allergies:   Bee venom; Codeine; Penicillins; Lodine [etodolac]; and Tramadol   Social History   Social History  . Marital status: Widowed    Spouse name: N/A  . Number of children: N/A  . Years of education: N/A   Social History Main Topics  . Smoking status: Former Smoker    Years: 10.00    Quit date: 05/04/1964  . Smokeless tobacco: Never Used  . Alcohol use 2.0 oz/week    4 Standard drinks or equivalent per week     Comment: social  . Drug use: No  . Sexual activity: No   Other Topics Concern  . None   Social History Narrative  . None     Family History: The patient's family history includes Arthritis in her father, mother, and sister; Deep vein thrombosis in her mother; Diabetes in her sister; Heart disease in her maternal grandfather, maternal grandmother, and sister; Hypertension in her mother and sister; Obesity in her sister; Peripheral vascular disease in her paternal grandmother; Stroke in her father and paternal grandfather. ROS:   Please see the history of present  illness.    All 14 point review of systems negative except as described per history of present illness.  EKGs/Labs/Other Studies Reviewed:    The following studies were reviewed today: Electrocardiogram showed normal sinus rhythm, normal PA interval, right atrial enlargement.    Recent Labs: 01/04/2017: ALT 7; BUN 21; Creatinine, Ser 0.70; Hemoglobin 13.8; Platelets 174.0; Potassium 3.8; Sodium 132; TSH 0.84  Recent Lipid Panel    Component Value Date/Time   CHOL 178 01/04/2017 1352   TRIG 198.0 (H) 01/04/2017 1352   HDL 44.30 01/04/2017 1352   CHOLHDL 4 01/04/2017 1352   VLDL 39.6 01/04/2017 1352   LDLCALC 94 01/04/2017 1352   LDLDIRECT 120.0 12/11/2014 1040  Physical Exam:    VS:  BP 120/72   Pulse 84   Resp 10   Ht 5\' 3"  (1.6 m)   Wt 124 lb 3.2 oz (56.3 kg)   LMP 01/13/2013 Comment: spotting-had benign endometrial biopsy   BMI 22.00 kg/m     Wt Readings from Last 3 Encounters:  09/14/17 124 lb 3.2 oz (56.3 kg)  04/04/17 125 lb (56.7 kg)  01/04/17 126 lb 6.4 oz (57.3 kg)     GEN:  Well nourished, well developed in no acute distress HEENT: Normal NECK: No JVD; No carotid bruits LYMPHATICS: No lymphadenopathy CARDIAC: RRR, no murmurs, no rubs, no gallops RESPIRATORY:  Clear to auscultation without rales, wheezing or rhonchi  ABDOMEN: Soft, non-tender, non-distended MUSCULOSKELETAL:  No edema; No deformity  SKIN: Warm and dry NEUROLOGIC:  Alert and oriented x 3 PSYCHIATRIC:  Normal affect   ASSESSMENT:    1. Benign hypertensive heart disease without heart failure   2. Essential hypertension   3. Malignant neoplasm of urinary bladder, unspecified site ([place])   4. Hyperlipidemia, unspecified hyperlipidemia type    PLAN:    In order of problems listed above:  1. Preop evaluation for this lady for a hip surgery.  She is doing well clinically.  She can walk 1 mile for 20 minutes with no difficulties she can climb stairs with no chest pain tightness squeezing  pressure burning chest.  I do not think she required any CAD workup at the moment.  I will schedule her however to have echocardiogram to assess her left ventricular ejection fraction as well as right atrial size and function. 2. Essential hypertension: Blood pressure is controlled with medications which I will continue. 3. History of urinary bladder cancer apparently stable followed by urology. 4. Dyslipidemia: Last check in February her HDL is 44 her LDL is 94.  She got minimal elevation of triglycerides overall is a fairly decent cholesterol profile   Medication Adjustments/Labs and Tests Ordered: Current medicines are reviewed at length with the patient today.  Concerns regarding medicines are outlined above.  No orders of the defined types were placed in this encounter.  No orders of the defined types were placed in this encounter.   Signed, Park Liter, MD, [facility]. 09/14/2017 10:49 AM    [facility]

## 2017-09-19 ENCOUNTER — Other Ambulatory Visit (HOSPITAL_COMMUNITY): Payer: Self-pay | Admitting: Emergency Medicine

## 2017-09-19 NOTE — Progress Notes (Signed)
[place] cardiology Dr Agustin Cree 09-14-17 epic   ECHO 09-14-17 epic  EKG 09-05-17 epic  CXR 12-06-16 epic

## 2017-09-19 NOTE — Patient Instructions (Addendum)
Stephanie [name]  09/19/2017   Your procedure is scheduled on: 09-28-17  Report to [facility] Main  Entrance    Report to admitting at 7:45AM    Call this number if you have problems the morning of surgery 6 [phone]   Remember: ONLY 1 PERSON MAY GO WITH YOU TO SHORT STAY TO GET  READY MORNING OF YOUR SURGERY.    Do not eat food or liquids:After Midnight!     Take these medicines the morning of surgery with A SIP OF WATER: CARBIDOPA-LEVIDOPA (SINEMET) , SERTRALINE(ZOLOFT), TYLENOL IF NEEDED                                You may not have any metal on your body including hair pins and              piercings  Do not wear jewelry, make-up, lotions, powders or perfumes, deodorant             Do not wear nail polish.  Do not shave  48 hours prior to surgery.     Do not bring valuables to the hospital. [place].  Contacts, dentures or bridgework may not be worn into surgery.  Leave suitcase in the car. After surgery it may be brought to your room.                Please read over the following fact sheets you were given: _____________________________________________________________________    [facility] - Preparing for Surgery Before surgery, you can play an important role.  Because skin is not sterile, your skin needs to be as free of germs as possible.  You can reduce the number of germs on your skin by washing with CHG (chlorahexidine gluconate) soap before surgery.  CHG is an antiseptic cleaner which kills germs and bonds with the skin to continue killing germs even after washing. Please DO NOT use if you have an allergy to CHG or antibacterial soaps.  If your skin becomes reddened/irritated stop using the CHG and inform your nurse when you arrive at Short Stay. Do not shave (including legs and underarms) for at least 48 hours prior to the first CHG shower.  You may shave your face/neck. Please follow  these instructions carefully:  1.  Shower with CHG Soap the night before surgery and the  morning of Surgery.  2.  If you choose to wash your hair, wash your hair first as usual with your  normal  shampoo.  3.  After you shampoo, rinse your hair and body thoroughly to remove the  shampoo.                           4.  Use CHG as you would any other liquid soap.  You can apply chg directly  to the skin and wash                       Gently with a scrungie or clean washcloth.  5.  Apply the CHG Soap to your body ONLY FROM THE NECK DOWN.   Do not use on face/ open  Wound or open sores. Avoid contact with eyes, ears mouth and genitals (private parts).                       Wash face,  Genitals (private parts) with your normal soap.             6.  Wash thoroughly, paying special attention to the area where your surgery  will be performed.  7.  Thoroughly rinse your body with warm water from the neck down.  8.  DO NOT shower/wash with your normal soap after using and rinsing off  the CHG Soap.                9.  Pat yourself dry with a clean towel.            10.  Wear clean pajamas.            11.  Place clean sheets on your bed the night of your first shower and do not  sleep with pets. Day of Surgery : Do not apply any lotions/deodorants the morning of surgery.  Please wear clean clothes to the hospital/surgery center.  FAILURE TO FOLLOW THESE INSTRUCTIONS MAY RESULT IN THE CANCELLATION OF YOUR SURGERY PATIENT SIGNATURE_________________________________  NURSE SIGNATURE__________________________________  ________________________________________________________________________   Stephanie [name]  An incentive spirometer is a tool that can help keep your lungs clear and active. This tool measures how well you are filling your lungs with each breath. Taking long deep breaths may help reverse or decrease the chance of developing breathing (pulmonary) problems  (especially infection) following:  A long period of time when you are unable to move or be active. BEFORE THE PROCEDURE   If the spirometer includes an indicator to show your best effort, your nurse or respiratory therapist will set it to a desired goal.  If possible, sit up straight or lean slightly forward. Try not to slouch.  Hold the incentive spirometer in an upright position. INSTRUCTIONS FOR USE  1. Sit on the edge of your bed if possible, or sit up as far as you can in bed or on a chair. 2. Hold the incentive spirometer in an upright position. 3. Breathe out normally. 4. Place the mouthpiece in your mouth and seal your lips tightly around it. 5. Breathe in slowly and as deeply as possible, raising the piston or the ball toward the top of the column. 6. Hold your breath for 3-5 seconds or for as long as possible. Allow the piston or ball to fall to the bottom of the column. 7. Remove the mouthpiece from your mouth and breathe out normally. 8. Rest for a few seconds and repeat Steps 1 through 7 at least 10 times every 1-2 hours when you are awake. Take your time and take a few normal breaths between deep breaths. 9. The spirometer may include an indicator to show your best effort. Use the indicator as a goal to work toward during each repetition. 10. After each set of 10 deep breaths, practice coughing to be sure your lungs are clear. If you have an incision (the cut made at the time of surgery), support your incision when coughing by placing a pillow or rolled up towels firmly against it. Once you are able to get out of bed, walk around indoors and cough well. You may stop using the incentive spirometer when instructed by your caregiver.  RISKS AND COMPLICATIONS  Take your time so you do not get  dizzy or light-headed.  If you are in pain, you may need to take or ask for pain medication before doing incentive spirometry. It is harder to take a deep breath if you are having  pain. AFTER USE  Rest and breathe slowly and easily.  It can be helpful to keep track of a log of your progress. Your caregiver can provide you with a simple table to help with this. If you are using the spirometer at home, follow these instructions: [place] IF:   You are having difficultly using the spirometer.  You have trouble using the spirometer as often as instructed.  Your pain medication is not giving enough relief while using the spirometer.  You develop fever of 100.5 F (38.1 C) or higher. SEEK IMMEDIATE MEDICAL CARE IF:   You cough up bloody sputum that had not been present before.  You develop fever of 102 F (38.9 C) or greater.  You develop worsening pain at or near the incision site. MAKE SURE YOU:   Understand these instructions.  Will watch your condition.  Will get help right away if you are not doing well or get worse. Document Released: 03/14/2007 Document Revised: 01/24/2012 Document Reviewed: 05/15/2007 ExitCare Patient Information 2014 ExitCare, [place].   ________________________________________________________________________  WHAT IS A BLOOD TRANSFUSION? Blood Transfusion Information  A transfusion is the replacement of blood or some of its parts. Blood is made up of multiple cells which provide different functions.  Red blood cells carry oxygen and are used for blood loss replacement.  White blood cells fight against infection.  Platelets control bleeding.  Plasma helps clot blood.  Other blood products are available for specialized needs, such as hemophilia or other clotting disorders. BEFORE THE TRANSFUSION  Who gives blood for transfusions?   Healthy volunteers who are fully evaluated to make sure their blood is safe. This is blood bank blood. Transfusion therapy is the safest it has ever been in the practice of medicine. Before blood is taken from a donor, a complete history is taken to make sure that person has no history  of diseases nor engages in risky social behavior (examples are intravenous drug use or sexual activity with multiple partners). The donor's travel history is screened to minimize risk of transmitting infections, such as malaria. The donated blood is tested for signs of infectious diseases, such as HIV and hepatitis. The blood is then tested to be sure it is compatible with you in order to minimize the chance of a transfusion reaction. If you or a relative donates blood, this is often done in anticipation of surgery and is not appropriate for emergency situations. It takes many days to process the donated blood. RISKS AND COMPLICATIONS Although transfusion therapy is very safe and saves many lives, the main dangers of transfusion include:   Getting an infectious disease.  Developing a transfusion reaction. This is an allergic reaction to something in the blood you were given. Every precaution is taken to prevent this. The decision to have a blood transfusion has been considered carefully by your caregiver before blood is given. Blood is not given unless the benefits outweigh the risks. AFTER THE TRANSFUSION  Right after receiving a blood transfusion, you will usually feel much better and more energetic. This is especially true if your red blood cells have gotten low (anemic). The transfusion raises the level of the red blood cells which carry oxygen, and this usually causes an energy increase.  The nurse administering the transfusion will  monitor you carefully for complications. HOME CARE INSTRUCTIONS  No special instructions are needed after a transfusion. You may find your energy is better. Speak with your caregiver about any limitations on activity for underlying diseases you may have. SEEK MEDICAL CARE IF:   Your condition is not improving after your transfusion.  You develop redness or irritation at the intravenous (IV) site. SEEK IMMEDIATE MEDICAL CARE IF:  Any of the following symptoms  occur over the next 12 hours:  Shaking chills.  You have a temperature by mouth above 102 F (38.9 C), not controlled by medicine.  Chest, back, or muscle pain.  People around you feel you are not acting correctly or are confused.  Shortness of breath or difficulty breathing.  Dizziness and fainting.  You get a rash or develop hives.  You have a decrease in urine output.  Your urine turns a dark color or changes to pink, red, or brown. Any of the following symptoms occur over the next 10 days:  You have a temperature by mouth above 102 F (38.9 C), not controlled by medicine.  Shortness of breath.  Weakness after normal activity.  The white part of the eye turns yellow (jaundice).  You have a decrease in the amount of urine or are urinating less often.  Your urine turns a dark color or changes to pink, red, or brown. Document Released: 10/29/2000 Document Revised: 01/24/2012 Document Reviewed: 06/17/2008 [facility] Patient Information 2014 [place], [place].  _______________________________________________________________________

## 2017-09-21 ENCOUNTER — Encounter (HOSPITAL_COMMUNITY): Payer: Self-pay

## 2017-09-21 ENCOUNTER — Other Ambulatory Visit: Payer: Self-pay

## 2017-09-21 ENCOUNTER — Encounter (HOSPITAL_COMMUNITY)
Admission: RE | Admit: 2017-09-21 | Discharge: 2017-09-21 | Disposition: A | Discharge status: Home / Self Care | Payer: Medicare Other | Source: Ambulatory Visit | Attending: Orthopedic Surgery | Admitting: Orthopedic Surgery

## 2017-09-21 DIAGNOSIS — M1611 Unilateral primary osteoarthritis, right hip: Secondary | ICD-10-CM | POA: Insufficient documentation

## 2017-09-21 DIAGNOSIS — Z0183 Encounter for blood typing: Secondary | ICD-10-CM | POA: Insufficient documentation

## 2017-09-21 DIAGNOSIS — Z01812 Encounter for preprocedural laboratory examination: Secondary | ICD-10-CM | POA: Insufficient documentation

## 2017-09-21 LAB — COMPREHENSIVE METABOLIC PANEL
ALK PHOS: 55 U/L (ref 38–126)
ALT: 19 U/L (ref 14–54)
ANION GAP: 8 (ref 5–15)
AST: 33 U/L (ref 15–41)
Albumin: 5 g/dL (ref 3.5–5.0)
BILIRUBIN TOTAL: 1 mg/dL (ref 0.3–1.2)
BUN: 20 mg/dL (ref 6–20)
CALCIUM: 9.9 mg/dL (ref 8.9–10.3)
CO2: 31 mmol/L (ref 22–32)
Chloride: 99 mmol/L — ABNORMAL LOW (ref 101–111)
Creatinine, Ser: 0.73 mg/dL (ref 0.44–1.00)
Glucose, Bld: 104 mg/dL — ABNORMAL HIGH (ref 65–99)
POTASSIUM: 4.2 mmol/L (ref 3.5–5.1)
Sodium: 138 mmol/L (ref 135–145)
TOTAL PROTEIN: 7.2 g/dL (ref 6.5–8.1)

## 2017-09-21 LAB — CBC
HEMATOCRIT: 43.4 % (ref 36.0–46.0)
HEMOGLOBIN: 14.7 g/dL (ref 12.0–15.0)
MCH: 30.6 pg (ref 26.0–34.0)
MCHC: 33.9 g/dL (ref 30.0–36.0)
MCV: 90.2 fL (ref 78.0–100.0)
Platelets: 162 10*3/uL (ref 150–400)
RBC: 4.81 MIL/uL (ref 3.87–5.11)
RDW: 13.4 % (ref 11.5–15.5)
WBC: 5.5 10*3/uL (ref 4.0–10.5)

## 2017-09-21 LAB — SURGICAL PCR SCREEN
MRSA, PCR: NEGATIVE
Staphylococcus aureus: NEGATIVE

## 2017-09-21 LAB — PROTIME-INR
INR: 0.95
PROTHROMBIN TIME: 12.6 s (ref 11.4–15.2)

## 2017-09-21 LAB — APTT: APTT: 26 s (ref 24–36)

## 2017-09-23 ENCOUNTER — Other Ambulatory Visit: Payer: Self-pay

## 2017-09-23 MED ORDER — HYDROCHLOROTHIAZIDE 25 MG PO TABS: 37.5000 mg | ORAL_TABLET | Freq: Every day | ORAL | 1 refills | Status: DC

## 2017-09-25 ENCOUNTER — Ambulatory Visit: Payer: Self-pay | Admitting: Orthopedic Surgery

## 2017-09-25 NOTE — H&P (Signed)
Stephanie [name] DOB: 1932-07-25 Married / Language: English / Race: White Female Date of Admission:  09/28/2017 CC:  Right hip pain History of Present Illness The patient is a 83 year old female who comes in  for a preoperative History and Physical. The patient is scheduled for a right total hip arthroplasty (anterior) to be performed by Dr. Dione Plover. Aluisio, MD at [facility] on 09-28-2017. The patient is a 83 year old female who is currently over 1 year out from left total hip arthroplasty. The patient states that she is doing very well at this time. The pain is under excellent control at this time. They are currently on no medication for their pain. The patient is currently doing home exercise program. The patient feels that they are progressing well at this time. The patient reports now right anterior hip problems including pain symptoms that have been present for year(s). The symptoms began without any known injury. The patient reports symptoms radiating to the: right thigh anteriorly. Onset of symptoms was gradual. The patient feels as if their symptoms are does feel they are worsening. Current treatment includes nonsteroidal anti-inflammatory drugs (Diclofenac). She has been having pain for years but it has been managed with NSAIDs. She had to come off of the Diclofenac while she was healing from the cervical fusion. She had pain bad enough then that it was almost unbearable to walk. She has not had any previous cortisone injections or surgery on the right hip. Her pain is both in the right groin and thigh as well as around the SI joint on the right. Radiographs AP pelvis and lateral the hips show the prosthesis on the LEFT to be in excellent position with no periprosthetic abnormalities. On the RIGHT she has bone-on-bone arthritis with subchondral cystic formation. She is doing great in regards to the LEFT hip. Her RIGHT hip is becoming increasingly problematic and she is having  significant pain and functional limitations. She has bone-on-bone arthritis in the RIGHT hip. We discussed injection versus proceeding with hip replacement and she like to go ahead and get the hip replaced. They have been treated conservatively in the past for the above stated problem and despite conservative measures, they continue to have progressive pain and severe functional limitations and dysfunction. They have failed non-operative management including home exercise, medications. It is felt that they would benefit from undergoing total joint replacement. Risks and benefits of the procedure have been discussed with the patient and they elect to proceed with surgery. There are no active contraindications to surgery such as ongoing infection or rapidly progressive neurological disease.    Problem List/Past Medical Lumbar radiculopathy (M54.16)  Primary osteoarthritis of lumbar spine (M47.816)  Cervical pain (M54.2)  Primary osteoarthritis of cervical spine (M47.812)  Spondylolisthesis, lumbar region (M43.16)  Acquired Primary osteoarthritis of right knee (M17.11)  Status post left hip replacement (U54.270)  Primary osteoarthritis of right hip (M16.11)  Asymptomatic microscopic hematuria (R31.21)  Constipation due to pain medication (K59.03)  Osteoarthritis  High blood pressure  Kidney Stone  Impaired Hearing  Vertigo  Parkinson's Disease  Cataract  Bladder Cancer  Eczema  Menopause   Allergies  PenicillAMINE *Miscellaneous Therapeutic Classes  local RXN following injection, redness Codeine Phosphate *ANALGESICS - OPIOID*  Nausea, Vomiting. Lodine *ANALGESICS - ANTI-INFLAMMATORY*  Rash. TraMADol HCl *ANALGESICS - OPIOID*  Itching.  Family History Cerebrovascular Accident  mother and father Congestive Heart Failure  father Heart Disease  mother, father and sister Hypertension  mother and  sister Osteoarthritis  mother, father and sister Father   Deceased. age 32 Mother  Deceased. age 15  Social History Alcohol use  current drinker; drinks wine; less than 5 per week Children  1 Current work status  retired Engineer, agricultural (Currently)  no Drug/Alcohol Rehab (Previously)  no Exercise  Exercises daily; does running / walking Illicit drug use  no Living situation  live with spouse Marital status  married Number of flights of stairs before winded  greater than 5 Pain Contract  no Tobacco / smoke exposure  no Tobacco use  former smoker Post-Surgical Plans  Home With Family, Home with HHPT. Advance Directives  Living Will, Healthcare Power of [place].  Medication History  Estradiol (0.5MG  Tablet, Oral) Active. MedroxyPROGESTERone Acetate (2.5MG  Tablet, Oral) Active. Sertraline HCl (20MG /ML Concentrate, Oral) Active. HydroCHLOROthiazide (25MG  Tablet, Oral) Active. Carbidopa-Levodopa (25-100MG  Tablet, Oral) Active. Valium (5MG  Tablet, Oral) Active. Extra Strength Tylenol Active. Biotin Active. Claritin Active. Probiotic Daily (Oral) Active. Co Q 10 (100MG  Capsule, Oral daily) Active. Vitamin D3 (10000UNIT Capsule, Oral) Active. Multiple Vitamin (1 (one) Oral) Active.  Past Surgical History  Cataract Surgery  bilateral Total Hip Replacement - Left [07/07/2016]: Dilation and Curettage of Uterus  Tonsillectomy  Right Wrist Surgery    Review of Systems General Not Present- Chills, Fatigue, Fever, Memory Loss, Night Sweats, Weight Gain and Weight Loss. Skin Present- Eczema. Not Present- Hives, Itching, Lesions and Rash. HEENT Present- Hearing problems and Tinnitus. Not Present- Dentures, Double Vision, Headache, Hearing Loss and Visual Loss. Respiratory Not Present- Allergies, Chronic Cough, Coughing up blood, Shortness of breath at rest and Shortness of breath with exertion. Cardiovascular Not Present- Chest Pain, Difficulty Breathing Lying Down, Murmur, Palpitations, Racing/skipping  heartbeats and Swelling. Gastrointestinal Not Present- Abdominal Pain, Bloody Stool, Constipation, Diarrhea, Difficulty Swallowing, Heartburn, Jaundice, Loss of appetitie, Nausea and Vomiting. Female Genitourinary Present- Urinating at Night. Not Present- Blood in Urine, Discharge, Flank Pain, Incontinence, Painful Urination, Urgency, Urinary frequency, Urinary Retention and Weak urinary stream. Musculoskeletal Present- Joint Pain, Joint Stiffness and Morning Stiffness. Not Present- Back Pain, Joint Swelling, Muscle Pain, Muscle Weakness and Spasms. Neurological Present- Tremor. Not Present- Blackout spells, Difficulty with balance, Dizziness, Paralysis and Weakness. Psychiatric Not Present- Insomnia.  Vitals  Weight: 125 lb Height: 63in Body Surface Area: 1.58 m Body Mass Index: 22.14 kg/m  Pulse: 72 (Regular)  BP: 148/78 (Sitting, Right Arm, Standard)  General appearance: alert and cooperative Head: Normocephalic, without obvious abnormality, atraumatic Neck: no carotid bruit and supple, symmetrical, trachea midline Lungs: clear to auscultation bilaterally Heart: regular rate and rhythm and S1, S2 normal Abdomen: soft, non-tender; bowel sounds normal; no masses,  no organomegaly Extremities: No pain with motion of the left hip. Left hip flexion 120, internal rotation 20, external rotation and abduction 40 degrees. Right hip motion is painful both passively and actively. Flexion 110, internal rotation 10, external rotation and abduction 20 degrees. Neurologically intact in LE. Distal pulses 2+.She has a significant antalgic gait pattern on the RIGHT. She has limited motion on the RIGHT. She has pain-free motion on the LEFT.   Radiographs AP pelvis and lateral the hips show the prosthesis on the LEFT to be in excellent position with no periprosthetic abnormalities. On the RIGHT she has bone-on-bone arthritis with subchondral cystic formation.  Assessment & Plan  Status post left  hip replacement (W10.272) Primary osteoarthritis of right hip (M16.11)  Note:Surgical Plans: Right Total Hip Replacement - Anterior Approach  Disposition: Home, HHPT  PCP: Dr. Penni Homans -  Patient has been seen preoperatively and felt to be stable for surgery. Cardiology: Pending at time of H&P  Topical TXA  Anesthesia Issues: None  Patient was instructed on what medications to stop prior to surgery.  Signed electronically by Joelene Millin, III PA-C

## 2017-09-25 NOTE — H&P (View-Only) (Signed)
Stephanie [name] DOB: 18-Jun-1932 Married / Language: English / Race: White Female Date of Admission:  09/28/2017 CC:  Right hip pain History of Present Illness The patient is a 83 year old female who comes in  for a preoperative History and Physical. The patient is scheduled for a right total hip arthroplasty (anterior) to be performed by Dr. Dione Plover. Aluisio, MD at [facility] on 09-28-2017. The patient is a 83 year old female who is currently over 1 year out from left total hip arthroplasty. The patient states that she is doing very well at this time. The pain is under excellent control at this time. They are currently on no medication for their pain. The patient is currently doing home exercise program. The patient feels that they are progressing well at this time. The patient reports now right anterior hip problems including pain symptoms that have been present for year(s). The symptoms began without any known injury. The patient reports symptoms radiating to the: right thigh anteriorly. Onset of symptoms was gradual. The patient feels as if their symptoms are does feel they are worsening. Current treatment includes nonsteroidal anti-inflammatory drugs (Diclofenac). She has been having pain for years but it has been managed with NSAIDs. She had to come off of the Diclofenac while she was healing from the cervical fusion. She had pain bad enough then that it was almost unbearable to walk. She has not had any previous cortisone injections or surgery on the right hip. Her pain is both in the right groin and thigh as well as around the SI joint on the right. Radiographs AP pelvis and lateral the hips show the prosthesis on the LEFT to be in excellent position with no periprosthetic abnormalities. On the RIGHT she has bone-on-bone arthritis with subchondral cystic formation. She is doing great in regards to the LEFT hip. Her RIGHT hip is becoming increasingly problematic and she is having  significant pain and functional limitations. She has bone-on-bone arthritis in the RIGHT hip. We discussed injection versus proceeding with hip replacement and she like to go ahead and get the hip replaced. They have been treated conservatively in the past for the above stated problem and despite conservative measures, they continue to have progressive pain and severe functional limitations and dysfunction. They have failed non-operative management including home exercise, medications. It is felt that they would benefit from undergoing total joint replacement. Risks and benefits of the procedure have been discussed with the patient and they elect to proceed with surgery. There are no active contraindications to surgery such as ongoing infection or rapidly progressive neurological disease.    Problem List/Past Medical Lumbar radiculopathy (M54.16)  Primary osteoarthritis of lumbar spine (M47.816)  Cervical pain (M54.2)  Primary osteoarthritis of cervical spine (M47.812)  Spondylolisthesis, lumbar region (M43.16)  Acquired Primary osteoarthritis of right knee (M17.11)  Status post left hip replacement (B76.283)  Primary osteoarthritis of right hip (M16.11)  Asymptomatic microscopic hematuria (R31.21)  Constipation due to pain medication (K59.03)  Osteoarthritis  High blood pressure  Kidney Stone  Impaired Hearing  Vertigo  Parkinson's Disease  Cataract  Bladder Cancer  Eczema  Menopause   Allergies  PenicillAMINE *Miscellaneous Therapeutic Classes  local RXN following injection, redness Codeine Phosphate *ANALGESICS - OPIOID*  Nausea, Vomiting. Lodine *ANALGESICS - ANTI-INFLAMMATORY*  Rash. TraMADol HCl *ANALGESICS - OPIOID*  Itching.  Family History Cerebrovascular Accident  mother and father Congestive Heart Failure  father Heart Disease  mother, father and sister Hypertension  mother and  sister Osteoarthritis  mother, father and sister Father   Deceased. age 10 Mother  Deceased. age 52  Social History Alcohol use  current drinker; drinks wine; less than 5 per week Children  1 Current work status  retired Engineer, agricultural (Currently)  no Drug/Alcohol Rehab (Previously)  no Exercise  Exercises daily; does running / walking Illicit drug use  no Living situation  live with spouse Marital status  married Number of flights of stairs before winded  greater than 5 Pain Contract  no Tobacco / smoke exposure  no Tobacco use  former smoker Post-Surgical Plans  Home With Family, Home with HHPT. Advance Directives  Living Will, Healthcare Power of [place].  Medication History  Estradiol (0.5MG  Tablet, Oral) Active. MedroxyPROGESTERone Acetate (2.5MG  Tablet, Oral) Active. Sertraline HCl (20MG /ML Concentrate, Oral) Active. HydroCHLOROthiazide (25MG  Tablet, Oral) Active. Carbidopa-Levodopa (25-100MG  Tablet, Oral) Active. Valium (5MG  Tablet, Oral) Active. Extra Strength Tylenol Active. Biotin Active. Claritin Active. Probiotic Daily (Oral) Active. Co Q 10 (100MG  Capsule, Oral daily) Active. Vitamin D3 (10000UNIT Capsule, Oral) Active. Multiple Vitamin (1 (one) Oral) Active.  Past Surgical History  Cataract Surgery  bilateral Total Hip Replacement - Left [07/07/2016]: Dilation and Curettage of Uterus  Tonsillectomy  Right Wrist Surgery    Review of Systems General Not Present- Chills, Fatigue, Fever, Memory Loss, Night Sweats, Weight Gain and Weight Loss. Skin Present- Eczema. Not Present- Hives, Itching, Lesions and Rash. HEENT Present- Hearing problems and Tinnitus. Not Present- Dentures, Double Vision, Headache, Hearing Loss and Visual Loss. Respiratory Not Present- Allergies, Chronic Cough, Coughing up blood, Shortness of breath at rest and Shortness of breath with exertion. Cardiovascular Not Present- Chest Pain, Difficulty Breathing Lying Down, Murmur, Palpitations, Racing/skipping  heartbeats and Swelling. Gastrointestinal Not Present- Abdominal Pain, Bloody Stool, Constipation, Diarrhea, Difficulty Swallowing, Heartburn, Jaundice, Loss of appetitie, Nausea and Vomiting. Female Genitourinary Present- Urinating at Night. Not Present- Blood in Urine, Discharge, Flank Pain, Incontinence, Painful Urination, Urgency, Urinary frequency, Urinary Retention and Weak urinary stream. Musculoskeletal Present- Joint Pain, Joint Stiffness and Morning Stiffness. Not Present- Back Pain, Joint Swelling, Muscle Pain, Muscle Weakness and Spasms. Neurological Present- Tremor. Not Present- Blackout spells, Difficulty with balance, Dizziness, Paralysis and Weakness. Psychiatric Not Present- Insomnia.  Vitals  Weight: 125 lb Height: 63in Body Surface Area: 1.58 m Body Mass Index: 22.14 kg/m  Pulse: 72 (Regular)  BP: 148/78 (Sitting, Right Arm, Standard)  General appearance: alert and cooperative Head: Normocephalic, without obvious abnormality, atraumatic Neck: no carotid bruit and supple, symmetrical, trachea midline Lungs: clear to auscultation bilaterally Heart: regular rate and rhythm and S1, S2 normal Abdomen: soft, non-tender; bowel sounds normal; no masses,  no organomegaly Extremities: No pain with motion of the left hip. Left hip flexion 120, internal rotation 20, external rotation and abduction 40 degrees. Right hip motion is painful both passively and actively. Flexion 110, internal rotation 10, external rotation and abduction 20 degrees. Neurologically intact in LE. Distal pulses 2+.She has a significant antalgic gait pattern on the RIGHT. She has limited motion on the RIGHT. She has pain-free motion on the LEFT.   Radiographs AP pelvis and lateral the hips show the prosthesis on the LEFT to be in excellent position with no periprosthetic abnormalities. On the RIGHT she has bone-on-bone arthritis with subchondral cystic formation.  Assessment & Plan  Status post left  hip replacement (Z61.096) Primary osteoarthritis of right hip (M16.11)  Note:Surgical Plans: Right Total Hip Replacement - Anterior Approach  Disposition: Home, HHPT  PCP: Dr. Penni Homans -  Patient has been seen preoperatively and felt to be stable for surgery. Cardiology: Pending at time of H&P  Topical TXA  Anesthesia Issues: None  Patient was instructed on what medications to stop prior to surgery.  Signed electronically by Joelene Millin, III PA-C

## 2017-09-28 ENCOUNTER — Inpatient Hospital Stay (HOSPITAL_COMMUNITY): Payer: Medicare Other | Admitting: Anesthesiology

## 2017-09-28 ENCOUNTER — Encounter (HOSPITAL_COMMUNITY)
Admission: RE | Disposition: A | Discharge status: Home Health | Payer: Self-pay | Source: Ambulatory Visit | Attending: Orthopedic Surgery

## 2017-09-28 ENCOUNTER — Encounter (HOSPITAL_COMMUNITY): Payer: Self-pay | Admitting: Anesthesiology

## 2017-09-28 ENCOUNTER — Inpatient Hospital Stay (HOSPITAL_COMMUNITY)
Admission: RE | Admit: 2017-09-28 | Discharge: 2017-09-29 | DRG: 470 | Disposition: A | Discharge status: Home Health | Payer: Medicare Other | Source: Ambulatory Visit | Attending: Orthopedic Surgery | Admitting: Orthopedic Surgery

## 2017-09-28 ENCOUNTER — Inpatient Hospital Stay (HOSPITAL_COMMUNITY): Payer: Medicare Other

## 2017-09-28 ENCOUNTER — Other Ambulatory Visit: Payer: Self-pay

## 2017-09-28 DIAGNOSIS — G20 Parkinson's disease: Secondary | ICD-10-CM | POA: Diagnosis present

## 2017-09-28 DIAGNOSIS — Z9842 Cataract extraction status, left eye: Secondary | ICD-10-CM

## 2017-09-28 DIAGNOSIS — Z9841 Cataract extraction status, right eye: Secondary | ICD-10-CM

## 2017-09-28 DIAGNOSIS — Z471 Aftercare following joint replacement surgery: Secondary | ICD-10-CM | POA: Diagnosis not present

## 2017-09-28 DIAGNOSIS — Z87442 Personal history of urinary calculi: Secondary | ICD-10-CM | POA: Diagnosis not present

## 2017-09-28 DIAGNOSIS — Z79899 Other long term (current) drug therapy: Secondary | ICD-10-CM

## 2017-09-28 DIAGNOSIS — Z885 Allergy status to narcotic agent status: Secondary | ICD-10-CM

## 2017-09-28 DIAGNOSIS — Z96641 Presence of right artificial hip joint: Secondary | ICD-10-CM | POA: Diagnosis not present

## 2017-09-28 DIAGNOSIS — H9190 Unspecified hearing loss, unspecified ear: Secondary | ICD-10-CM | POA: Diagnosis present

## 2017-09-28 DIAGNOSIS — Z96642 Presence of left artificial hip joint: Secondary | ICD-10-CM | POA: Diagnosis present

## 2017-09-28 DIAGNOSIS — Z87891 Personal history of nicotine dependence: Secondary | ICD-10-CM

## 2017-09-28 DIAGNOSIS — I10 Essential (primary) hypertension: Secondary | ICD-10-CM | POA: Diagnosis present

## 2017-09-28 DIAGNOSIS — Z96649 Presence of unspecified artificial hip joint: Secondary | ICD-10-CM

## 2017-09-28 DIAGNOSIS — Z880 Allergy status to penicillin: Secondary | ICD-10-CM

## 2017-09-28 DIAGNOSIS — Z823 Family history of stroke: Secondary | ICD-10-CM

## 2017-09-28 DIAGNOSIS — M1611 Unilateral primary osteoarthritis, right hip: Principal | ICD-10-CM | POA: Diagnosis present

## 2017-09-28 DIAGNOSIS — Z8249 Family history of ischemic heart disease and other diseases of the circulatory system: Secondary | ICD-10-CM | POA: Diagnosis not present

## 2017-09-28 DIAGNOSIS — L309 Dermatitis, unspecified: Secondary | ICD-10-CM | POA: Diagnosis present

## 2017-09-28 DIAGNOSIS — E785 Hyperlipidemia, unspecified: Secondary | ICD-10-CM | POA: Diagnosis not present

## 2017-09-28 DIAGNOSIS — J309 Allergic rhinitis, unspecified: Secondary | ICD-10-CM | POA: Diagnosis not present

## 2017-09-28 DIAGNOSIS — M4316 Spondylolisthesis, lumbar region: Secondary | ICD-10-CM | POA: Diagnosis not present

## 2017-09-28 DIAGNOSIS — F418 Other specified anxiety disorders: Secondary | ICD-10-CM | POA: Diagnosis not present

## 2017-09-28 DIAGNOSIS — C679 Malignant neoplasm of bladder, unspecified: Secondary | ICD-10-CM | POA: Diagnosis not present

## 2017-09-28 DIAGNOSIS — M169 Osteoarthritis of hip, unspecified: Secondary | ICD-10-CM | POA: Diagnosis present

## 2017-09-28 DIAGNOSIS — M542 Cervicalgia: Secondary | ICD-10-CM | POA: Diagnosis not present

## 2017-09-28 HISTORY — PX: TOTAL HIP ARTHROPLASTY: SHX124

## 2017-09-28 LAB — TYPE AND SCREEN
ABO/RH(D): A NEG
ANTIBODY SCREEN: NEGATIVE

## 2017-09-28 SURGERY — ARTHROPLASTY, HIP, TOTAL, ANTERIOR APPROACH
Anesthesia: Spinal | Site: Hip | Laterality: Right

## 2017-09-28 MED ORDER — HYDROMORPHONE HCL 1 MG/ML IJ SOLN
0.2500 mg | INTRAMUSCULAR | Status: DC | PRN
  Administered 2017-09-28 (×4): 0.5 mg via INTRAVENOUS

## 2017-09-28 MED ORDER — GLYCOPYRROLATE 0.2 MG/ML IV SOSY
PREFILLED_SYRINGE | INTRAVENOUS | Status: DC | PRN
  Administered 2017-09-28: .2 mg via INTRAVENOUS

## 2017-09-28 MED ORDER — DEXTROSE 5 % IV SOLN
500.0000 mg | Freq: Four times a day (QID) | INTRAVENOUS | Status: DC | PRN
  Administered 2017-09-28: 500 mg via INTRAVENOUS
  Filled 2017-09-28: qty 550

## 2017-09-28 MED ORDER — SODIUM CHLORIDE 0.9 % IV SOLN
INTRAVENOUS | Status: DC
  Administered 2017-09-28 – 2017-09-29 (×2): via INTRAVENOUS

## 2017-09-28 MED ORDER — HYDROMORPHONE HCL 1 MG/ML IJ SOLN
INTRAMUSCULAR | Status: AC
  Filled 2017-09-28: qty 2

## 2017-09-28 MED ORDER — SERTRALINE HCL 25 MG PO TABS
25.0000 mg | ORAL_TABLET | Freq: Every morning | ORAL | Status: DC
  Administered 2017-09-29: 25 mg via ORAL
  Filled 2017-09-28: qty 1

## 2017-09-28 MED ORDER — ONDANSETRON HCL 4 MG/2ML IJ SOLN
INTRAMUSCULAR | Status: DC | PRN
Start: 2017-09-28 — End: 2017-09-28
  Administered 2017-09-28: 4 mg via INTRAVENOUS

## 2017-09-28 MED ORDER — PHENYLEPHRINE HCL 10 MG/ML IJ SOLN
INTRAMUSCULAR | Status: DC | PRN
  Administered 2017-09-28: 25 ug/min via INTRAVENOUS

## 2017-09-28 MED ORDER — DOCUSATE SODIUM 100 MG PO CAPS
100.0000 mg | ORAL_CAPSULE | Freq: Two times a day (BID) | ORAL | Status: DC
  Administered 2017-09-28 – 2017-09-29 (×2): 100 mg via ORAL
  Filled 2017-09-28 (×2): qty 1

## 2017-09-28 MED ORDER — DEXAMETHASONE SODIUM PHOSPHATE 10 MG/ML IJ SOLN
10.0000 mg | Freq: Once | INTRAMUSCULAR | Status: AC
  Administered 2017-09-29: 10 mg via INTRAVENOUS
  Filled 2017-09-28: qty 1

## 2017-09-28 MED ORDER — BUPIVACAINE HCL (PF) 0.25 % IJ SOLN
INTRAMUSCULAR | Status: AC
  Filled 2017-09-28: qty 30

## 2017-09-28 MED ORDER — ACETAMINOPHEN 650 MG RE SUPP: 650.0000 mg | RECTAL | Status: DC | PRN

## 2017-09-28 MED ORDER — PROPOFOL 10 MG/ML IV BOLUS
INTRAVENOUS | Status: DC | PRN
  Administered 2017-09-28: 10 mg via INTRAVENOUS

## 2017-09-28 MED ORDER — METOCLOPRAMIDE HCL 5 MG/ML IJ SOLN: 5.0000 mg | Freq: Three times a day (TID) | INTRAMUSCULAR | Status: DC | PRN

## 2017-09-28 MED ORDER — VANCOMYCIN HCL IN DEXTROSE 1-5 GM/200ML-% IV SOLN
INTRAVENOUS | Status: AC
  Filled 2017-09-28: qty 200

## 2017-09-28 MED ORDER — METOCLOPRAMIDE HCL 5 MG PO TABS: 5.0000 mg | ORAL_TABLET | Freq: Three times a day (TID) | ORAL | Status: DC | PRN

## 2017-09-28 MED ORDER — GLYCOPYRROLATE 0.2 MG/ML IV SOSY
PREFILLED_SYRINGE | INTRAVENOUS | Status: AC
  Filled 2017-09-28: qty 5

## 2017-09-28 MED ORDER — ONDANSETRON HCL 4 MG PO TABS: 4.0000 mg | ORAL_TABLET | Freq: Four times a day (QID) | ORAL | Status: DC | PRN

## 2017-09-28 MED ORDER — LACTATED RINGERS IV SOLN
INTRAVENOUS | Status: DC
  Administered 2017-09-28: 10:00:00 via INTRAVENOUS
  Administered 2017-09-28: 1000 mL via INTRAVENOUS
  Administered 2017-09-28: 08:00:00 via INTRAVENOUS

## 2017-09-28 MED ORDER — HYDROCHLOROTHIAZIDE 25 MG PO TABS
37.5000 mg | ORAL_TABLET | Freq: Every day | ORAL | Status: DC
  Administered 2017-09-29: 37.5 mg via ORAL
  Filled 2017-09-28: qty 2

## 2017-09-28 MED ORDER — ACETAMINOPHEN 10 MG/ML IV SOLN
INTRAVENOUS | Status: AC
  Filled 2017-09-28: qty 100

## 2017-09-28 MED ORDER — RIVAROXABAN 10 MG PO TABS
10.0000 mg | ORAL_TABLET | Freq: Every day | ORAL | Status: DC
  Administered 2017-09-29: 10 mg via ORAL
  Filled 2017-09-28: qty 1

## 2017-09-28 MED ORDER — SODIUM CHLORIDE 0.9 % IR SOLN
Status: DC | PRN
  Administered 2017-09-28: 1000 mL

## 2017-09-28 MED ORDER — ONDANSETRON HCL 4 MG/2ML IJ SOLN
INTRAMUSCULAR | Status: AC
Start: 2017-09-28 — End: 2017-09-28
  Filled 2017-09-28: qty 2

## 2017-09-28 MED ORDER — ONDANSETRON HCL 4 MG/2ML IJ SOLN: 4.0000 mg | Freq: Four times a day (QID) | INTRAMUSCULAR | Status: DC | PRN

## 2017-09-28 MED ORDER — ACETAMINOPHEN 10 MG/ML IV SOLN
1000.0000 mg | Freq: Once | INTRAVENOUS | Status: AC
  Administered 2017-09-28: 1000 mg via INTRAVENOUS

## 2017-09-28 MED ORDER — PROPOFOL 500 MG/50ML IV EMUL
INTRAVENOUS | Status: DC | PRN
  Administered 2017-09-28: 75 ug/kg/min via INTRAVENOUS

## 2017-09-28 MED ORDER — VANCOMYCIN HCL IN DEXTROSE 1-5 GM/200ML-% IV SOLN
1000.0000 mg | Freq: Two times a day (BID) | INTRAVENOUS | Status: AC
  Administered 2017-09-28: 1000 mg via INTRAVENOUS
  Filled 2017-09-28: qty 200

## 2017-09-28 MED ORDER — OXYCODONE HCL 5 MG PO TABS: 10.0000 mg | ORAL_TABLET | ORAL | Status: DC | PRN

## 2017-09-28 MED ORDER — DEXAMETHASONE SODIUM PHOSPHATE 10 MG/ML IJ SOLN
INTRAMUSCULAR | Status: AC
  Filled 2017-09-28: qty 1

## 2017-09-28 MED ORDER — ACETAMINOPHEN 500 MG PO TABS
1000.0000 mg | ORAL_TABLET | Freq: Four times a day (QID) | ORAL | Status: DC
  Administered 2017-09-28 – 2017-09-29 (×3): 1000 mg via ORAL
  Filled 2017-09-28 (×3): qty 2

## 2017-09-28 MED ORDER — OXYCODONE HCL 5 MG PO TABS
5.0000 mg | ORAL_TABLET | ORAL | Status: DC | PRN
  Administered 2017-09-28 – 2017-09-29 (×4): 5 mg via ORAL
  Filled 2017-09-28 (×4): qty 1

## 2017-09-28 MED ORDER — METHOCARBAMOL 500 MG PO TABS
500.0000 mg | ORAL_TABLET | Freq: Four times a day (QID) | ORAL | Status: DC | PRN
  Administered 2017-09-28 – 2017-09-29 (×2): 500 mg via ORAL
  Filled 2017-09-28 (×2): qty 1

## 2017-09-28 MED ORDER — TRANEXAMIC ACID 1000 MG/10ML IV SOLN
INTRAVENOUS | Status: AC | PRN
  Administered 2017-09-28: 2000 mg via TOPICAL

## 2017-09-28 MED ORDER — LORATADINE 10 MG PO TABS
10.0000 mg | ORAL_TABLET | Freq: Every day | ORAL | Status: DC
  Administered 2017-09-29: 10 mg via ORAL
  Filled 2017-09-28: qty 1

## 2017-09-28 MED ORDER — CHLORHEXIDINE GLUCONATE 4 % EX LIQD: 60.0000 mL | Freq: Once | CUTANEOUS | Status: DC

## 2017-09-28 MED ORDER — FLEET ENEMA 7-19 GM/118ML RE ENEM: 1.0000 | ENEMA | Freq: Once | RECTAL | Status: DC | PRN

## 2017-09-28 MED ORDER — ACETAMINOPHEN 325 MG PO TABS: 650.0000 mg | ORAL_TABLET | ORAL | Status: DC | PRN

## 2017-09-28 MED ORDER — PHENYLEPHRINE HCL 10 MG/ML IJ SOLN
INTRAMUSCULAR | Status: AC
  Filled 2017-09-28: qty 1

## 2017-09-28 MED ORDER — DIPHENHYDRAMINE HCL 12.5 MG/5ML PO ELIX: 12.5000 mg | ORAL_SOLUTION | ORAL | Status: DC | PRN

## 2017-09-28 MED ORDER — BUPIVACAINE IN DEXTROSE 0.75-8.25 % IT SOLN
INTRATHECAL | Status: DC | PRN
  Administered 2017-09-28: 1.6 mL via INTRATHECAL

## 2017-09-28 MED ORDER — DEXAMETHASONE SODIUM PHOSPHATE 10 MG/ML IJ SOLN
INTRAMUSCULAR | Status: AC
  Filled 2017-09-28: qty 2

## 2017-09-28 MED ORDER — TRANEXAMIC ACID 1000 MG/10ML IV SOLN
2000.0000 mg | Freq: Once | INTRAVENOUS | Status: DC
  Filled 2017-09-28: qty 20

## 2017-09-28 MED ORDER — BUPIVACAINE HCL (PF) 0.25 % IJ SOLN
INTRAMUSCULAR | Status: DC | PRN
  Administered 2017-09-28: 30 mL

## 2017-09-28 MED ORDER — POLYETHYLENE GLYCOL 3350 17 G PO PACK: 17.0000 g | PACK | Freq: Every day | ORAL | Status: DC | PRN

## 2017-09-28 MED ORDER — ONDANSETRON HCL 4 MG/2ML IJ SOLN
INTRAMUSCULAR | Status: AC
  Filled 2017-09-28: qty 4

## 2017-09-28 MED ORDER — DEXAMETHASONE SODIUM PHOSPHATE 10 MG/ML IJ SOLN
10.0000 mg | Freq: Once | INTRAMUSCULAR | Status: AC
  Administered 2017-09-28: 10 mg via INTRAVENOUS

## 2017-09-28 MED ORDER — DIAZEPAM 5 MG PO TABS
5.0000 mg | ORAL_TABLET | Freq: Every day | ORAL | Status: DC
  Administered 2017-09-28: 5 mg via ORAL
  Filled 2017-09-28: qty 1

## 2017-09-28 MED ORDER — PHENOL 1.4 % MT LIQD
1.0000 | OROMUCOSAL | Status: DC | PRN
  Filled 2017-09-28: qty 177

## 2017-09-28 MED ORDER — BISACODYL 10 MG RE SUPP: 10.0000 mg | Freq: Every day | RECTAL | Status: DC | PRN

## 2017-09-28 MED ORDER — PHENYLEPHRINE 40 MCG/ML (10ML) SYRINGE FOR IV PUSH (FOR BLOOD PRESSURE SUPPORT)
PREFILLED_SYRINGE | INTRAVENOUS | Status: AC
  Filled 2017-09-28: qty 10

## 2017-09-28 MED ORDER — CARBIDOPA-LEVODOPA 25-100 MG PO TABS
1.0000 | ORAL_TABLET | Freq: Three times a day (TID) | ORAL | Status: DC
  Administered 2017-09-28 – 2017-09-29 (×2): 1 via ORAL
  Filled 2017-09-28 (×2): qty 1

## 2017-09-28 MED ORDER — VANCOMYCIN HCL IN DEXTROSE 1-5 GM/200ML-% IV SOLN
1000.0000 mg | INTRAVENOUS | Status: AC
  Administered 2017-09-28: 1000 mg via INTRAVENOUS

## 2017-09-28 MED ORDER — PROPOFOL 10 MG/ML IV BOLUS
INTRAVENOUS | Status: AC
  Filled 2017-09-28: qty 40

## 2017-09-28 MED ORDER — CARBIDOPA-LEVODOPA 25-100 MG PO TABS
1.0000 | ORAL_TABLET | Freq: Three times a day (TID) | ORAL | Status: DC
  Administered 2017-09-28: 1 via ORAL
  Filled 2017-09-28: qty 1

## 2017-09-28 MED ORDER — MENTHOL 3 MG MT LOZG
1.0000 | LOZENGE | OROMUCOSAL | Status: DC | PRN
  Administered 2017-09-28: 3 mg via ORAL
  Filled 2017-09-28: qty 9

## 2017-09-28 MED ORDER — PROMETHAZINE HCL 25 MG/ML IJ SOLN: 6.2500 mg | INTRAMUSCULAR | Status: DC | PRN

## 2017-09-28 MED ORDER — FENTANYL CITRATE (PF) 100 MCG/2ML IJ SOLN
INTRAMUSCULAR | Status: AC
  Filled 2017-09-28: qty 2

## 2017-09-28 MED ORDER — STERILE WATER FOR IRRIGATION IR SOLN
Status: DC | PRN
  Administered 2017-09-28: 3000 mL

## 2017-09-28 MED ORDER — FENTANYL CITRATE (PF) 100 MCG/2ML IJ SOLN
INTRAMUSCULAR | Status: DC | PRN
  Administered 2017-09-28: 50 ug via INTRAVENOUS

## 2017-09-28 MED ORDER — PHENYLEPHRINE 40 MCG/ML (10ML) SYRINGE FOR IV PUSH (FOR BLOOD PRESSURE SUPPORT)
PREFILLED_SYRINGE | INTRAVENOUS | Status: DC | PRN
  Administered 2017-09-28 (×2): 80 ug via INTRAVENOUS

## 2017-09-28 SURGICAL SUPPLY — 36 items
BAG DECANTER FOR FLEXI CONT (MISCELLANEOUS) ×2 IMPLANT
BAG SPEC THK2 15X12 ZIP CLS (MISCELLANEOUS)
BAG ZIPLOCK 12X15 (MISCELLANEOUS) IMPLANT
BLADE SAG 18X100X1.27 (BLADE) ×2 IMPLANT
CAPT HIP TOTAL 2 ×1 IMPLANT
CLOTH BEACON ORANGE TIMEOUT ST (SAFETY) ×2 IMPLANT
COVER PERINEAL POST (MISCELLANEOUS) ×2 IMPLANT
COVER SURGICAL LIGHT HANDLE (MISCELLANEOUS) ×2 IMPLANT
DECANTER SPIKE VIAL GLASS SM (MISCELLANEOUS) ×2 IMPLANT
DRAPE STERI IOBAN 125X83 (DRAPES) ×2 IMPLANT
DRAPE U-SHAPE 47X51 STRL (DRAPES) ×4 IMPLANT
DRSG ADAPTIC 3X8 NADH LF (GAUZE/BANDAGES/DRESSINGS) ×2 IMPLANT
DRSG MEPILEX BORDER 4X4 (GAUZE/BANDAGES/DRESSINGS) ×2 IMPLANT
DRSG MEPILEX BORDER 4X8 (GAUZE/BANDAGES/DRESSINGS) ×2 IMPLANT
DURAPREP 26ML APPLICATOR (WOUND CARE) ×2 IMPLANT
ELECT REM PT RETURN 15FT ADLT (MISCELLANEOUS) ×2 IMPLANT
EVACUATOR 1/8 PVC DRAIN (DRAIN) ×2 IMPLANT
GLOVE BIO SURGEON STRL SZ7.5 (GLOVE) ×2 IMPLANT
GLOVE BIO SURGEON STRL SZ8 (GLOVE) ×4 IMPLANT
GLOVE BIOGEL PI IND STRL 7.5 (GLOVE) IMPLANT
GLOVE BIOGEL PI IND STRL 8 (GLOVE) ×2 IMPLANT
GLOVE BIOGEL PI INDICATOR 7.5 (GLOVE) ×5
GLOVE BIOGEL PI INDICATOR 8 (GLOVE) ×2
GOWN STRL REUS W/TWL LRG LVL3 (GOWN DISPOSABLE) ×4 IMPLANT
GOWN STRL REUS W/TWL XL LVL3 (GOWN DISPOSABLE) ×3 IMPLANT
PACK ANTERIOR HIP CUSTOM (KITS) ×2 IMPLANT
STRIP CLOSURE SKIN 1/2X4 (GAUZE/BANDAGES/DRESSINGS) ×2 IMPLANT
SUT ETHIBOND NAB CT1 #1 30IN (SUTURE) ×2 IMPLANT
SUT MNCRL AB 4-0 PS2 18 (SUTURE) ×2 IMPLANT
SUT STRATAFIX 0 PDS 27 VIOLET (SUTURE) ×2
SUT VIC AB 2-0 CT1 27 (SUTURE) ×4
SUT VIC AB 2-0 CT1 TAPERPNT 27 (SUTURE) ×2 IMPLANT
SUTURE STRATFX 0 PDS 27 VIOLET (SUTURE) ×1 IMPLANT
SYR 50ML LL SCALE MARK (SYRINGE) IMPLANT
TRAY FOLEY CATH 14FR (SET/KITS/TRAYS/PACK) ×1 IMPLANT
YANKAUER SUCT BULB TIP 10FT TU (MISCELLANEOUS) ×2 IMPLANT

## 2017-09-28 NOTE — Anesthesia Procedure Notes (Signed)
Spinal  Patient location during procedure: OR Start time: 09/28/2017 9:43 AM End time: 09/28/2017 9:53 AM Staffing Anesthesiologist: Myrtie Soman, MD Performed: anesthesiologist  Preanesthetic Checklist Completed: patient identified, site marked, surgical consent, pre-op evaluation, timeout performed, IV checked, risks and benefits discussed and monitors and equipment checked Spinal Block Patient position: sitting Prep: Betadine Patient monitoring: heart rate, continuous pulse ox and blood pressure Location: L3-4 Injection technique: single-shot Needle Needle type: Sprotte  Needle gauge: 24 G Needle length: 9 cm Additional Notes Expiration date of kit checked and confirmed. Patient tolerated procedure well, without complications.

## 2017-09-28 NOTE — Anesthesia Preprocedure Evaluation (Signed)
Anesthesia Evaluation  Patient identified by MRN, date of birth, ID band Patient awake    Reviewed: Allergy & Precautions, NPO status , Patient's Chart, lab work & pertinent test results  Airway Mallampati: II  TM Distance: >3 FB Neck ROM: Limited    Dental no notable dental hx.    Pulmonary neg pulmonary ROS, former smoker,    Pulmonary exam normal breath sounds clear to auscultation       Cardiovascular hypertension, Normal cardiovascular exam Rhythm:Regular Rate:Normal     Neuro/Psych negative neurological ROS  negative psych ROS   GI/Hepatic negative GI ROS, Neg liver ROS,   Endo/Other  negative endocrine ROS  Renal/GU negative Renal ROS  negative genitourinary   Musculoskeletal negative musculoskeletal ROS (+)   Abdominal   Peds negative pediatric ROS (+)  Hematology negative hematology ROS (+)   Anesthesia Other Findings   Reproductive/Obstetrics negative OB ROS                             Anesthesia Physical Anesthesia Plan  ASA: II  Anesthesia Plan: Spinal   Post-op Pain Management:    Induction: Intravenous  PONV Risk Score and Plan: 1 and Treatment may vary due to age or medical condition  Airway Management Planned: Simple Face Mask  Additional Equipment:   Intra-op Plan:   Post-operative Plan:   Informed Consent: I have reviewed the patients History and Physical, chart, labs and discussed the procedure including the risks, benefits and alternatives for the proposed anesthesia with the patient or authorized representative who has indicated his/her understanding and acceptance.   Dental advisory given  Plan Discussed with: CRNA and Surgeon  Anesthesia Plan Comments:         Anesthesia Quick Evaluation

## 2017-09-28 NOTE — Evaluation (Signed)
Physical Therapy Evaluation Patient Details Name: Stephanie [name] MRN: [id number] DOB: April 24, 1932 Today's Date: 09/28/2017   History of Present Illness  Pt s/p R THR and with hx of L THR 8/17, cervical fusion earlier this year, Parkinsons, and Bladder CA  Clinical Impression  Pt s/p R THR and presents with decreased R LE strength/ROM and post op pain limiting functional mobility.  Pt should progress to dc home with assist of family/friends.    Follow Up Recommendations DC plan and follow up therapy as arranged by surgeon    Equipment Recommendations  None recommended by PT    Recommendations for Other Services       Precautions / Restrictions Precautions Precautions: Fall Restrictions Weight Bearing Restrictions: No Other Position/Activity Restrictions: WBAT      Mobility  Bed Mobility Overal bed mobility: Needs Assistance Bed Mobility: Supine to Sit     Supine to sit: Min assist     General bed mobility comments: Cues for sequence and use of L LE to self assist  Transfers Overall transfer level: Needs assistance Equipment used: Rolling walker (2 wheeled) Transfers: Sit to/from Stand Sit to Stand: Min assist         General transfer comment: cues for LE management and use of UEs to self assist  Ambulation/Gait Ambulation/Gait assistance: Min assist Ambulation Distance (Feet): 50 Feet Assistive device: Rolling walker (2 wheeled) Gait Pattern/deviations: Step-to pattern;Decreased step length - right;Decreased step length - left;Shuffle;Trunk flexed Gait velocity: decr   General Gait Details: cues for sequence, posture and position from [employer]            Wheelchair Mobility    Modified Rankin (Stroke Patients Only)       Balance                                             Pertinent Vitals/Pain Pain Assessment: 0-10 Pain Score: 5  Pain Location: R hip Pain Descriptors / Indicators: Aching;Sore Pain Intervention(s):  Limited activity within patient's tolerance;Monitored during session;Premedicated before session;Ice applied    Home Living Family/patient expects to be discharged to:: Private residence Living Arrangements: Children Available Help at Discharge: Family;Friend(s) Type of Home: House Home Access: Stairs to enter Entrance Stairs-Rails: Psychiatric nurse of Steps: 4 Home Layout: One level Home Equipment: Environmental consultant - 2 wheels      Prior Function Level of Independence: Independent               Hand Dominance        Extremity/Trunk Assessment   Upper Extremity Assessment Upper Extremity Assessment: Overall WFL for tasks assessed    Lower Extremity Assessment Lower Extremity Assessment: RLE deficits/detail       Communication   Communication: HOH  Cognition Arousal/Alertness: Awake/alert Behavior During Therapy: WFL for tasks assessed/performed Overall Cognitive Status: Within Functional Limits for tasks assessed                                        General Comments      Exercises     Assessment/Plan    PT Assessment Patient needs continued PT services  PT Problem List Decreased strength;Decreased range of motion;Decreased activity tolerance;Decreased mobility;Pain;Decreased knowledge of use of DME       PT Treatment Interventions DME  instruction;Gait training;Stair training;Functional mobility training;Therapeutic activities;Therapeutic exercise;Patient/family education    PT Goals (Current goals can be found in the Care Plan section)  Acute Rehab PT Goals Patient Stated Goal: Regain IND PT Goal Formulation: With patient Time For Goal Achievement: 10/01/17 Potential to Achieve Goals: Good    Frequency 7X/week   Barriers to discharge        Co-evaluation               AM-PAC PT "6 Clicks" Daily Activity  Outcome Measure Difficulty turning over in bed (including adjusting bedclothes, sheets and blankets)?:  Unable Difficulty moving from lying on back to sitting on the side of the bed? : Unable Difficulty sitting down on and standing up from a chair with arms (e.g., wheelchair, bedside commode, etc,.)?: Unable Help needed moving to and from a bed to chair (including a wheelchair)?: A Little Help needed walking in hospital room?: A Little Help needed climbing 3-5 steps with a railing? : A Little 6 Click Score: 12    End of Session Equipment Utilized During Treatment: Gait belt Activity Tolerance: Patient tolerated treatment well Patient left: in chair;with call bell/phone within reach;with chair alarm set Nurse Communication: Mobility status PT Visit Diagnosis: Difficulty in walking, not elsewhere classified (R26.2)    Time: 8891-6945 PT Time Calculation (min) (ACUTE ONLY): 25 min   Charges:   PT Evaluation $PT Eval Low Complexity: 1 Low PT Treatments $Gait Training: 8-22 mins   PT G Codes:        Pg [phone]   Kharizma Lesnick 09/28/2017, 5:19 PM

## 2017-09-28 NOTE — Anesthesia Postprocedure Evaluation (Signed)
Anesthesia Post Note  Patient: Stephanie [name]  Procedure(s) Performed: RIGHT TOTAL HIP ARTHROPLASTY ANTERIOR APPROACH (Right Hip)     Patient location during evaluation: PACU Anesthesia Type: Spinal Level of consciousness: oriented and awake and alert Pain management: pain level controlled Vital Signs Assessment: post-procedure vital signs reviewed and stable Respiratory status: spontaneous breathing, respiratory function stable and patient connected to nasal cannula oxygen Cardiovascular status: blood pressure returned to baseline and stable Postop Assessment: no headache, no backache and no apparent nausea or vomiting Anesthetic complications: no    Last Vitals:  Vitals:   09/28/17 1138 09/28/17 1145  BP: 104/64 (!) 117/57  Pulse: 65 63  Resp: 12 11  Temp: (!) 36 C   SpO2: 100% 100%    Last Pain:  Vitals:   09/28/17 0742  TempSrc: Oral                 Yasiel Goyne S

## 2017-09-28 NOTE — Transfer of Care (Signed)
Immediate Anesthesia Transfer of Care Note  Patient: Stephanie [name]  Procedure(s) Performed: RIGHT TOTAL HIP ARTHROPLASTY ANTERIOR APPROACH (Right Hip)  Patient Location: PACU  Anesthesia Type:Spinal  Level of Consciousness: awake, alert  and oriented  Airway & Oxygen Therapy: Patient Spontanous Breathing and Patient connected to face mask oxygen  Post-op Assessment: Report given to RN and Post -op Vital signs reviewed and stable  Post vital signs: Reviewed and stable  Last Vitals:  Vitals:   09/28/17 0742  BP: (!) 166/70  Pulse: 73  Resp: 12  Temp: 36.4 C  SpO2: 100%    Last Pain:  Vitals:   09/28/17 0742  TempSrc: Oral      Patients Stated Pain Goal: 4 ([id number]/42/[id number])  Complications: No apparent anesthesia complications

## 2017-09-28 NOTE — Discharge Instructions (Addendum)
° °Dr. Frank Aluisio °Total Joint Specialist °[facility] °[address] °[place], [place] [address] °[phone] ° °ANTERIOR APPROACH TOTAL HIP REPLACEMENT POSTOPERATIVE DIRECTIONS ° ° °Hip Rehabilitation, Guidelines Following Surgery  °The results of a hip operation are greatly improved after range of motion and muscle strengthening exercises. Follow all safety measures which are given to protect your hip. If any of these exercises cause increased pain or swelling in your joint, decrease the amount until you are comfortable again. Then slowly increase the exercises. Call your caregiver if you have problems or questions.  ° °HOME CARE INSTRUCTIONS  °Remove items at home which could result in a fall. This includes throw rugs or furniture in walking pathways.  °· ICE to the affected hip every three hours for 30 minutes at a time and then as needed for pain and swelling.  Continue to use ice on the hip for pain and swelling from surgery. You may notice swelling that will progress down to the foot and ankle.  This is normal after surgery.  Elevate the leg when you are not up walking on it.   °· Continue to use the breathing machine which will help keep your temperature down.  It is common for your temperature to cycle up and down following surgery, especially at night when you are not up moving around and exerting yourself.  The breathing machine keeps your lungs expanded and your temperature down. ° ° °DIET °You may resume your previous home diet once your are discharged from the hospital. ° °DRESSING / WOUND CARE / SHOWERING °You may shower 3 days after surgery, but keep the wounds dry during showering.  You may use an occlusive plastic wrap (Press'n Seal for example), NO SOAKING/SUBMERGING IN THE BATHTUB.  If the bandage gets wet, change with a clean dry gauze.  If the incision gets wet, pat the wound dry with a clean towel. °You may start showering once you are discharged home but do not  submerge the incision under water. Just pat the incision dry and apply a dry gauze dressing on daily. °Change the surgical dressing daily and reapply a dry dressing each time. ° °ACTIVITY °Walk with your walker as instructed. °Use walker as long as suggested by your caregivers. °Avoid periods of inactivity such as sitting longer than an hour when not asleep. This helps prevent blood clots.  °You may resume a sexual relationship in one month or when given the OK by your doctor.  °You may return to work once you are cleared by your doctor.  °Do not drive a car for 6 weeks or until released by you surgeon.  °Do not drive while taking narcotics. ° °WEIGHT BEARING °Weight bearing as tolerated with assist device (walker, cane, etc) as directed, use it as long as suggested by your surgeon or therapist, typically at least 4-6 weeks. ° °POSTOPERATIVE CONSTIPATION PROTOCOL °Constipation - defined medically as fewer than three stools per week and severe constipation as less than one stool per week. ° °One of the most common issues patients have following surgery is constipation.  Even if you have a regular bowel pattern at home, your normal regimen is likely to be disrupted due to multiple reasons following surgery.  Combination of anesthesia, postoperative narcotics, change in appetite and fluid intake all can affect your bowels.  In order to avoid complications following surgery, here are some recommendations in order to help you during your recovery period. ° °Colace (docusate) - Pick up an over-the-counter   form of Colace or another stool softener and take twice a day as long as you are requiring postoperative pain medications.  Take with a full glass of water daily.  If you experience loose stools or diarrhea, hold the colace until you stool forms back up.  If your symptoms do not get better within 1 week or if they get worse, check with your doctor. ° °Dulcolax (bisacodyl) - Pick up over-the-counter and take as directed  by the product packaging as needed to assist with the movement of your bowels.  Take with a full glass of water.  Use this product as needed if not relieved by Colace only.  ° °MiraLax (polyethylene glycol) - Pick up over-the-counter to have on hand.  MiraLax is a solution that will increase the amount of water in your bowels to assist with bowel movements.  Take as directed and can mix with a glass of water, juice, soda, coffee, or tea.  Take if you go more than two days without a movement. °Do not use MiraLax more than once per day. Call your doctor if you are still constipated or irregular after using this medication for 7 days in a row. ° °If you continue to have problems with postoperative constipation, please contact the office for further assistance and recommendations.  If you experience "the worst abdominal pain ever" or develop nausea or vomiting, please contact the office immediatly for further recommendations for treatment. ° °ITCHING ° If you experience itching with your medications, try taking only a single pain pill, or even half a pain pill at a time.  You can also use Benadryl over the counter for itching or also to help with sleep.  ° °TED HOSE STOCKINGS °Wear the elastic stockings on both legs for three weeks following surgery during the day but you may remove then at night for sleeping. ° °MEDICATIONS °See your medication summary on the “After Visit Summary” that the nursing staff will review with you prior to discharge.  You may have some home medications which will be placed on hold until you complete the course of blood thinner medication.  It is important for you to complete the blood thinner medication as prescribed by your surgeon.  Continue your approved medications as instructed at time of discharge. ° °PRECAUTIONS °If you experience chest pain or shortness of breath - call 911 immediately for transfer to the hospital emergency department.  °If you develop a fever greater that 101 F,  purulent drainage from wound, increased redness or drainage from wound, foul odor from the wound/dressing, or calf pain - CONTACT YOUR SURGEON.   °                                                °FOLLOW-UP APPOINTMENTS °Make sure you keep all of your appointments after your operation with your surgeon and caregivers. You should call the office at the above phone number and make an appointment for approximately two weeks after the date of your surgery or on the date instructed by your surgeon outlined in the "After Visit Summary". ° °RANGE OF MOTION AND STRENGTHENING EXERCISES  °These exercises are designed to help you keep full movement of your hip joint. Follow your caregiver's or physical therapist's instructions. Perform all exercises about fifteen times, three times per day or as directed. Exercise both hips, even if you   have had only one joint replacement. These exercises can be done on a training (exercise) mat, on the floor, on a table or on a bed. Use whatever works the best and is most comfortable for you. Use music or television while you are exercising so that the exercises are a pleasant break in your day. This will make your life better with the exercises acting as a break in routine you can look forward to.  °Lying on your back, slowly slide your foot toward your buttocks, raising your knee up off the floor. Then slowly slide your foot back down until your leg is straight again.  °Lying on your back spread your legs as far apart as you can without causing discomfort.  °Lying on your side, raise your upper leg and foot straight up from the floor as far as is comfortable. Slowly lower the leg and repeat.  °Lying on your back, tighten up the muscle in the front of your thigh (quadriceps muscles). You can do this by keeping your leg straight and trying to raise your heel off the floor. This helps strengthen the largest muscle supporting your knee.  °Lying on your back, tighten up the muscles of your  buttocks both with the legs straight and with the knee bent at a comfortable angle while keeping your heel on the floor.  ° °IF YOU ARE TRANSFERRED TO A SKILLED REHAB FACILITY °If the patient is transferred to a skilled rehab facility following release from the hospital, a list of the current medications will be sent to the facility for the patient to continue.  When discharged from the skilled rehab facility, please have the facility set up the patient's Home Health Physical Therapy prior to being released. Also, the skilled facility will be responsible for providing the patient with their medications at time of release from the facility to include their pain medication, the muscle relaxants, and their blood thinner medication. If the patient is still at the rehab facility at time of the two week follow up appointment, the skilled rehab facility will also need to assist the patient in arranging follow up appointment in our office and any transportation needs. ° °MAKE SURE YOU:  °Understand these instructions.  °Get help right away if you are not doing well or get worse.  ° ° °Pick up stool softner and laxative for home use following surgery while on pain medications. °Do not submerge incision under water. °Please use good hand washing techniques while changing dressing each day. °May shower starting three days after surgery. °Please use a clean towel to pat the incision dry following showers. °Continue to use ice for pain and swelling after surgery. °Do not use any lotions or creams on the incision until instructed by your surgeon. ° °Take Xarelto for two and a half more weeks following discharge from the hospital, then discontinue Xarelto. °Once the patient has completed the blood thinner regimen, then take a Baby 81 mg Aspirin daily for three more weeks. ° ° ° ° °Information on my medicine - XARELTO® (Rivaroxaban) ° °Why was Xarelto® prescribed for you? °Xarelto® was prescribed for you to reduce the risk of blood  clots forming after orthopedic surgery. The medical term for these abnormal blood clots is venous thromboembolism (VTE). ° °What do you need to know about xarelto® ? °Take your Xarelto® ONCE DAILY at the same time every day. °You may take it either with or without food. ° °If you have difficulty swallowing the tablet whole, you may   crush it and mix in applesauce just prior to taking your dose. ° °Take Xarelto® exactly as prescribed by your doctor and DO NOT stop taking Xarelto® without talking to the doctor who prescribed the medication.  Stopping without other VTE prevention medication to take the place of Xarelto® may increase your risk of developing a clot. ° °After discharge, you should have regular check-up appointments with your healthcare provider that is prescribing your Xarelto®.   ° °What do you do if you miss a dose? °If you miss a dose, take it as soon as you remember on the same day then continue your regularly scheduled once daily regimen the next day. Do not take two doses of Xarelto® on the same day.  ° °Important Safety Information °A possible side effect of Xarelto® is bleeding. You should call your healthcare provider right away if you experience any of the following: °? Bleeding from an injury or your nose that does not stop. °? Unusual colored urine (red or dark brown) or unusual colored stools (red or black). °? Unusual bruising for unknown reasons. °? A serious fall or if you hit your head (even if there is no bleeding). ° °Some medicines may interact with Xarelto® and might increase your risk of bleeding while on Xarelto®. To help avoid this, consult your healthcare provider or pharmacist prior to using any new prescription or non-prescription medications, including herbals, vitamins, non-steroidal anti-inflammatory drugs (NSAIDs) and supplements. ° °This website has more information on Xarelto®: www.xarelto.com. ° ° ° °

## 2017-09-28 NOTE — Interval H&P Note (Signed)
History and Physical Interval Note:  09/28/2017 7:44 AM  [name]  has presented today for surgery, with the diagnosis of Osteoarthritis Right Hip   The various methods of treatment have been discussed with the patient and family. After consideration of risks, benefits and other options for treatment, the patient has consented to  Procedure(s): RIGHT TOTAL HIP ARTHROPLASTY ANTERIOR APPROACH (Right) as a surgical intervention .  The patient's history has been reviewed, patient examined, no change in status, stable for surgery.  I have reviewed the patient's chart and labs.  Questions were answered to the patient's satisfaction.     Gearlean Alf

## 2017-09-28 NOTE — Op Note (Signed)
OPERATIVE REPORT- TOTAL HIP ARTHROPLASTY   PREOPERATIVE DIAGNOSIS: Osteoarthritis of the Right hip.   POSTOPERATIVE DIAGNOSIS: Osteoarthritis of the Right  hip.   PROCEDURE: Right total hip arthroplasty, anterior approach.   SURGEON: Gaynelle Arabian, MD   ASSISTANT: Arlee Muslim, PA-C  ANESTHESIA:  Spinal  ESTIMATED BLOOD LOSS:-200 mL    DRAINS: Hemovac x1.   COMPLICATIONS: None   CONDITION: PACU - hemodynamically stable.   BRIEF CLINICAL NOTE: Stephanie [name] is a 83 y.o. female who has advanced end-  stage arthritis of their Right  hip with progressively worsening pain and  dysfunction.The patient has failed nonoperative management and presents for  total hip arthroplasty.   PROCEDURE IN DETAIL: After successful administration of spinal  anesthetic, the traction boots for the [facility] bed were placed on both  feet and the patient was placed onto the [facility] bed, boots placed into the leg  holders. The Right hip was then isolated from the perineum with plastic  drapes and prepped and draped in the usual sterile fashion. ASIS and  greater trochanter were marked and a oblique incision was made, starting  at about 1 cm lateral and 2 cm distal to the ASIS and coursing towards  the anterior cortex of the femur. The skin was cut with a 10 blade  through subcutaneous tissue to the level of the fascia overlying the  tensor fascia lata muscle. The fascia was then incised in line with the  incision at the junction of the anterior third and posterior 2/3rd. The  muscle was teased off the fascia and then the interval between the TFL  and the rectus was developed. The Hohmann retractor was then placed at  the top of the femoral neck over the capsule. The vessels overlying the  capsule were cauterized and the fat on top of the capsule was removed.  A Hohmann retractor was then placed anterior underneath the rectus  femoris to give exposure to the entire anterior capsule. A T-shaped   capsulotomy was performed. The edges were tagged and the femoral head  was identified.       Osteophytes are removed off the superior acetabulum.  The femoral neck was then cut in situ with an oscillating saw. Traction  was then applied to the left lower extremity utilizing the [facility]  traction. The femoral head was then removed. Retractors were placed  around the acetabulum and then circumferential removal of the labrum was  performed. Osteophytes were also removed. Reaming starts at 45 mm to  medialize and  Increased in 2 mm increments to 49 mm. We reamed in  approximately 40 degrees of abduction, 20 degrees anteversion. A 50 mm  pinnacle acetabular shell was then impacted in anatomic position under  fluoroscopic guidance with excellent purchase. We did not need to place  any additional dome screws. A 32 mm neutral + 4 marathon liner was then  placed into the acetabular shell.       The femoral lift was then placed along the lateral aspect of the femur  just distal to the vastus ridge. The leg was  externally rotated and capsule  was stripped off the inferior aspect of the femoral neck down to the  level of the lesser trochanter, this was done with electrocautery. The femur was lifted after this was performed. The  leg was then placed in an extended and adducted position essentially delivering the femur. We also removed the capsule superiorly and the piriformis from the piriformis  fossa to gain excellent exposure of the  proximal femur. Rongeur was used to remove some cancellous bone to get  into the lateral portion of the proximal femur for placement of the  initial starter reamer. The starter broaches was placed  the starter broach  and was shown to go down the center of the canal. Broaching  with the  Corail system was then performed starting at size 8, coursing  Up to size 11. A size 11 had excellent torsional and rotational  and axial stability. The trial high offset neck was then  placed  with a 32 + 1 trial head. The hip was then reduced. We confirmed that  the stem was in the canal both on AP and lateral x-rays. It also has excellent sizing. The hip was reduced with outstanding stability through full extension and full external rotation.. AP pelvis was taken and the leg lengths were measured and found to be equal. Hip was then dislocated again and the femoral head and neck removed. The  femoral broach was removed. Size 11 Corail stem with a high offset  neck was then impacted into the femur following native anteversion. Has  excellent purchase in the canal. Excellent torsional and rotational and  axial stability. It is confirmed to be in the canal on AP and lateral  fluoroscopic views. The 32 + 1 ceramic head was placed and the hip  reduced with outstanding stability. Again AP pelvis was taken and it  confirmed that the leg lengths were equal. The wound was then copiously  irrigated with saline solution and the capsule reattached and repaired  with Ethibond suture. 30 ml of .25% Bupivicaine was  injected into the capsule and into the edge of the tensor fascia lata as well as subcutaneous tissue. The fascia overlying the tensor fascia lata was then closed with a running #1 V-Loc. Subcu was closed with interrupted 2-0 Vicryl and subcuticular running 4-0 Monocryl. Incision was cleaned  and dried. Steri-Strips and a bulky sterile dressing applied. Hemovac  drain was hooked to suction and then the patient was awakened and transported to  recovery in stable condition.        Please note that a surgical assistant was a medical necessity for this procedure to perform it in a safe and expeditious manner. Assistant was necessary to provide appropriate retraction of vital neurovascular structures and to prevent femoral fracture and allow for anatomic placement of the prosthesis.  Gaynelle Arabian, M.D.

## 2017-09-29 LAB — CBC
HCT: 34.5 % — ABNORMAL LOW (ref 36.0–46.0)
Hemoglobin: 11.7 g/dL — ABNORMAL LOW (ref 12.0–15.0)
MCH: 30.8 pg (ref 26.0–34.0)
MCHC: 33.9 g/dL (ref 30.0–36.0)
MCV: 90.8 fL (ref 78.0–100.0)
PLATELETS: 164 10*3/uL (ref 150–400)
RBC: 3.8 MIL/uL — AB (ref 3.87–5.11)
RDW: 13.5 % (ref 11.5–15.5)
WBC: 9.2 10*3/uL (ref 4.0–10.5)

## 2017-09-29 LAB — BASIC METABOLIC PANEL
ANION GAP: 5 (ref 5–15)
BUN: 12 mg/dL (ref 6–20)
CALCIUM: 8.5 mg/dL — AB (ref 8.9–10.3)
CO2: 29 mmol/L (ref 22–32)
Chloride: 99 mmol/L — ABNORMAL LOW (ref 101–111)
Creatinine, Ser: 0.63 mg/dL (ref 0.44–1.00)
GFR calc Af Amer: >60 mL/min (ref >60)
GLUCOSE: 146 mg/dL — AB (ref 65–99)
Potassium: 3.8 mmol/L (ref 3.5–5.1)
SODIUM: 133 mmol/L — AB (ref 135–145)

## 2017-09-29 MED ORDER — OXYCODONE HCL 5 MG PO TABS: 5.0000 mg | ORAL_TABLET | ORAL | 0 refills | Status: DC | PRN

## 2017-09-29 MED ORDER — RIVAROXABAN 10 MG PO TABS: 10.0000 mg | ORAL_TABLET | Freq: Every day | ORAL | 0 refills | Status: DC

## 2017-09-29 MED ORDER — METHOCARBAMOL 500 MG PO TABS: 500.0000 mg | ORAL_TABLET | Freq: Four times a day (QID) | ORAL | 0 refills | Status: DC | PRN

## 2017-09-29 NOTE — Progress Notes (Signed)
Physical Therapy Treatment Patient Details Name: Stephanie [name] MRN: [id number] DOB: 1932-04-06 Today's Date: 09/29/2017    History of Present Illness Pt s/p R THR and with hx of L THR 8/17, cervical fusion earlier this year, Parkinsons, and Bladder CA    PT Comments    The patient is progressing well. Ready for DC.   Follow Up Recommendations  DC plan and follow up therapy as arranged by surgeon     Equipment Recommendations  None recommended by PT    Recommendations for Other Services       Precautions / Restrictions Precautions Precautions: Fall Restrictions Weight Bearing Restrictions: No    Mobility  Bed Mobility Overal bed mobility: Modified Independent             General bed mobility comments: in recliner  Transfers Overall transfer level: Needs assistance Equipment used: Rolling walker (2 wheeled) Transfers: Sit to/from Stand Sit to Stand: Supervision         General transfer comment: cues for LE management and use of UEs to self assist  Ambulation/Gait Ambulation/Gait assistance: Supervision Ambulation Distance (Feet): 200 Feet Assistive device: Rolling walker (2 wheeled) Gait Pattern/deviations: Step-through pattern Gait velocity: decr   General Gait Details: cues for sequence, posture and position from RW   Stairs Stairs: Yes   Stair Management: One rail Left;Two rails;Step to pattern Number of Stairs: 5 General stair comments: pt. knows sequence  Wheelchair Mobility    Modified Rankin (Stroke Patients Only)       Balance                                            Cognition Arousal/Alertness: Awake/alert                                            Exercises Total Joint Exercises Ankle Circles/Pumps: AROM;Both;10 reps Short Arc Quad: AROM;Right;10 reps Heel Slides: AROM;Right;10 reps Hip ABduction/ADduction: AAROM;Right;10 reps    General Comments        Pertinent Vitals/Pain  Pain Score: 2  Pain Location: R hip Pain Descriptors / Indicators: Burning Pain Intervention(s): Premedicated before session;Ice applied    Home Living                      Prior Function            PT Goals (current goals can now be found in the care plan section) Progress towards PT goals: Progressing toward goals    Frequency    7X/week      PT Plan Current plan remains appropriate    Co-evaluation              AM-PAC PT "6 Clicks" Daily Activity  Outcome Measure  Difficulty turning over in bed (including adjusting bedclothes, sheets and blankets)?: None Difficulty moving from lying on back to sitting on the side of the bed? : None Difficulty sitting down on and standing up from a chair with arms (e.g., wheelchair, bedside commode, etc,.)?: A Little Help needed moving to and from a bed to chair (including a wheelchair)?: A Little Help needed walking in hospital room?: A Little Help needed climbing 3-5 steps with a railing? : A Little 6 Click Score: 20    End  of Session   Activity Tolerance: Patient tolerated treatment well Patient left: in chair;with call bell/phone within reach;with chair alarm set Nurse Communication: Mobility status PT Visit Diagnosis: Difficulty in walking, not elsewhere classified (R26.2)     Time: 4196-2229 PT Time Calculation (min) (ACUTE ONLY): 21 min  Charges:  $Gait Training: 8-22 mins                     G Codes:        Claretha Cooper 09/29/2017, 11:33 AM

## 2017-09-29 NOTE — Progress Notes (Signed)
Physical Therapy Treatment Patient Details Name: Stephanie [name] MRN: [id number] DOB: 11/28/1931 Today's Date: 09/29/2017    History of Present Illness Pt s/p R THR and with hx of L THR 8/17, cervical fusion earlier this year, Parkinsons, and Bladder CA    PT Comments    The patient is mobilizing well. Will review steps, plans Dc today.   Follow Up Recommendations  DC plan and follow up therapy as arranged by surgeon     Equipment Recommendations  None recommended by PT    Recommendations for Other Services       Precautions / Restrictions Precautions Precautions: Fall    Mobility  Bed Mobility Overal bed mobility: Modified Independent                Transfers Overall transfer level: Needs assistance Equipment used: Rolling walker (2 wheeled) Transfers: Sit to/from Stand Sit to Stand: Supervision         General transfer comment: cues for LE management and use of UEs to self assist  Ambulation/Gait Ambulation/Gait assistance: Supervision Ambulation Distance (Feet): 120 Feet Assistive device: Rolling walker (2 wheeled) Gait Pattern/deviations: Step-through pattern Gait velocity: decr   General Gait Details: cues for sequence, posture and position from RW   Stairs            Wheelchair Mobility    Modified Rankin (Stroke Patients Only)       Balance                                            Cognition Arousal/Alertness: Awake/alert                                            Exercises Total Joint Exercises Ankle Circles/Pumps: AROM;Both;10 reps Short Arc Quad: AROM;Right;10 reps Heel Slides: AROM;Right;10 reps Hip ABduction/ADduction: AAROM;Right;10 reps    General Comments        Pertinent Vitals/Pain Pain Score: 3  Pain Location: R hip Pain Descriptors / Indicators: Burning    Home Living                      Prior Function            PT Goals (current goals can now be  found in the care plan section) Progress towards PT goals: Progressing toward goals    Frequency    7X/week      PT Plan Current plan remains appropriate    Co-evaluation              AM-PAC PT "6 Clicks" Daily Activity  Outcome Measure  Difficulty turning over in bed (including adjusting bedclothes, sheets and blankets)?: None Difficulty moving from lying on back to sitting on the side of the bed? : None Difficulty sitting down on and standing up from a chair with arms (e.g., wheelchair, bedside commode, etc,.)?: A Little Help needed moving to and from a bed to chair (including a wheelchair)?: A Little Help needed walking in hospital room?: A Little Help needed climbing 3-5 steps with a railing? : A Little 6 Click Score: 20    End of Session   Activity Tolerance: Patient tolerated treatment well Patient left: in chair;with call bell/phone within reach;with chair alarm set Nurse Communication: Mobility  status PT Visit Diagnosis: Difficulty in walking, not elsewhere classified (R26.2)     Time: 4656-8127 PT Time Calculation (min) (ACUTE ONLY): 25 min  Charges:  $Gait Training: 8-22 mins $Therapeutic Exercise: 8-22 mins                    G CodesTresa Endo PT [phone]    Claretha Cooper 09/29/2017, 10:12 AM

## 2017-09-29 NOTE — Discharge Summary (Signed)
Physician Discharge Summary   Patient ID: Stephanie [name] MRN: [id number] DOB/AGE: August 16, 1932 83 y.o.  Admit date: 09/28/2017 Discharge date: 09-29-2017  Primary Diagnosis:  Osteoarthritis of the Right hip.   Admission Diagnoses:  Past Medical History:  Diagnosis Date  . Arthropathy of cervical spine ([place]) 11/27/2012   Right  Xray at [facility]  On 04/08/2016  AP, lateral, and lateral flexion and extension views of the cervical spine are submitted for evaluation.   No acute fracture identified in the cervical spine.   There is redemonstration of approximately 2 mm of C3 on C4 anterolisthesis in neutral which slightly increases in flexion relative to neutral and extension. Slight C5 on C6 retrolisthesis does not change i  . Benign hypertension without congestive heart failure   . Benign mole    right hcets mole, bleeds when dried with towel  . Bilateral dry eyes   . Bladder cancer ([place])   . Bladder cancer ([place]) 10/17/2016  . Cervical spondylosis   . Cervicalgia   . Depression with anxiety 10/17/2016  . Gross hematuria   . History of kidney stones    1970's  . Lumbar stenosis   . Malignant tumor of urinary bladder ([place]) 07/2016   Pre-stage I  . OA (osteoarthritis)   . Parkinson disease [facility]) dx 2012   neurologist-  dr siddiqui at [facility]   Discharge Diagnoses:   Active Problems:   OA (osteoarthritis) of hip  Estimated body mass index is 22.14 kg/m as calculated from the following:   Height as of this encounter: _0  (1.6 m).   Weight as of this encounter: 56.7 kg (125 lb).  Procedure(s) (LRB): RIGHT TOTAL HIP ARTHROPLASTY ANTERIOR APPROACH (Right)   Consults: None  HPI: Stephanie [name] is a 83 y.o. female who has advanced end-  stage arthritis of their Right  hip with progressively worsening pain and  dysfunction.The patient has failed nonoperative management and presents for  total hip arthroplasty.    Laboratory Data: Admission on 09/28/2017  Component Date Value  Ref Range Status  . WBC 09/29/2017 9.2  4.0 - 10.5 K/uL Final  . RBC 09/29/2017 3.80* 3.87 - 5.11 MIL/uL Final  . Hemoglobin 09/29/2017 11.7* 12.0 - 15.0 g/dL Final  . HCT 09/29/2017 34.5* 36.0 - 46.0 % Final  . MCV 09/29/2017 90.8  78.0 - 100.0 fL Final  . MCH 09/29/2017 30.8  26.0 - 34.0 pg Final  . MCHC 09/29/2017 33.9  30.0 - 36.0 g/dL Final  . RDW 09/29/2017 13.5  11.5 - 15.5 % Final  . Platelets 09/29/2017 164  150 - 400 K/uL Final  . Sodium 09/29/2017 133* 135 - 145 mmol/L Final  . Potassium 09/29/2017 3.8  3.5 - 5.1 mmol/L Final  . Chloride 09/29/2017 99* 101 - 111 mmol/L Final  . CO2 09/29/2017 29  22 - 32 mmol/L Final  . Glucose, Bld 09/29/2017 146* 65 - 99 mg/dL Final  . BUN 09/29/2017 12  6 - 20 mg/dL Final  . Creatinine, Ser 09/29/2017 0.63  0.44 - 1.00 mg/dL Final  . Calcium 09/29/2017 8.5* 8.9 - 10.3 mg/dL Final  . GFR calc non Af Amer 09/29/2017 >60  >60 mL/min Final  . GFR calc Af Amer 09/29/2017 >60  >60 mL/min Final   Comment: (NOTE) The eGFR has been calculated using the CKD EPI equation. This calculation has not been validated in all clinical situations. eGFR's persistently <60 mL/min signify possible Chronic Kidney Disease.   Georgiann Hahn gap 09/29/2017  5  5 - 15 Final  Hospital Outpatient Visit on 09/21/2017  Component Date Value Ref Range Status  . MRSA, PCR 09/21/2017 NEGATIVE  NEGATIVE Final  . Staphylococcus aureus 09/21/2017 NEGATIVE  NEGATIVE Final   Comment: (NOTE) The Xpert SA Assay (FDA approved for NASAL specimens in patients 7 years of age and older), is one component of a comprehensive surveillance program. It is not intended to diagnose infection nor to guide or monitor treatment.   Marland Kitchen aPTT 09/21/2017 26  24 - 36 seconds Final  . WBC 09/21/2017 5.5  4.0 - 10.5 K/uL Final  . RBC 09/21/2017 4.81  3.87 - 5.11 MIL/uL Final  . Hemoglobin 09/21/2017 14.7  12.0 - 15.0 g/dL Final  . HCT 09/21/2017 43.4  36.0 - 46.0 % Final  . MCV 09/21/2017 90.2  78.0  - 100.0 fL Final  . MCH 09/21/2017 30.6  26.0 - 34.0 pg Final  . MCHC 09/21/2017 33.9  30.0 - 36.0 g/dL Final  . RDW 09/21/2017 13.4  11.5 - 15.5 % Final  . Platelets 09/21/2017 162  150 - 400 K/uL Final  . Sodium 09/21/2017 138  135 - 145 mmol/L Final  . Potassium 09/21/2017 4.2  3.5 - 5.1 mmol/L Final  . Chloride 09/21/2017 99* 101 - 111 mmol/L Final  . CO2 09/21/2017 31  22 - 32 mmol/L Final  . Glucose, Bld 09/21/2017 104* 65 - 99 mg/dL Final  . BUN 09/21/2017 20  6 - 20 mg/dL Final  . Creatinine, Ser 09/21/2017 0.73  0.44 - 1.00 mg/dL Final  . Calcium 09/21/2017 9.9  8.9 - 10.3 mg/dL Final  . Total Protein 09/21/2017 7.2  6.5 - 8.1 g/dL Final  . Albumin 09/21/2017 5.0  3.5 - 5.0 g/dL Final  . AST 09/21/2017 33  15 - 41 U/L Final  . ALT 09/21/2017 19  14 - 54 U/L Final  . Alkaline Phosphatase 09/21/2017 55  38 - 126 U/L Final  . Total Bilirubin 09/21/2017 1.0  0.3 - 1.2 mg/dL Final  . GFR calc non Af Amer 09/21/2017 >60  >60 mL/min Final  . GFR calc Af Amer 09/21/2017 >60  >60 mL/min Final   Comment: (NOTE) The eGFR has been calculated using the CKD EPI equation. This calculation has not been validated in all clinical situations. eGFR's persistently <60 mL/min signify possible Chronic Kidney Disease.   . Anion gap 09/21/2017 8  5 - 15 Final  . Prothrombin Time 09/21/2017 12.6  11.4 - 15.2 seconds Final  . INR 09/21/2017 0.95   Final  . ABO/RH(D) 09/21/2017 A NEG   Final  . Antibody Screen 09/21/2017 NEG   Final  . Sample Expiration 09/21/2017 10/01/2017   Final  . Extend sample reason 09/21/2017 NO TRANSFUSIONS OR PREGNANCY IN THE PAST 3 MONTHS   Final  Hospital Outpatient Visit on 09/14/2017  Component Date Value Ref Range Status  . Weight 09/14/2017 1,987.2  oz Final  . Height 09/14/2017 63.000  in Final  . BP 09/14/2017 120/72  mmHg Final     X-Rays:Dg Pelvis Portable  Result Date: 09/28/2017 CLINICAL DATA:  Status post right total hip replacement. EXAM: PORTABLE  PELVIS 1-2 VIEWS COMPARISON:  None. FINDINGS: Portable AP lower pelvis upper thigh shows the patient be status post bilateral total hip replacement, more recently on the right. Compared to the intraoperative spot fluoro films from earlier today, there has been no substantial change although there is less gas visible in the operative bed. Surgical drain overlies  the right hip. Gas in the soft tissues of the proximal thigh is compatible with the immediate postoperative state. IMPRESSION: Status post right hip replacement earlier today without evidence for immediate hardware complications. Electronically Signed   By: Misty Stanley M.D.   On: 09/28/2017 11:56   Dg C-arm 1-60 Min-no Report  Result Date: 09/28/2017 Fluoroscopy was utilized by the requesting physician.  No radiographic interpretation.    EKG: Orders placed or performed in visit on 09/05/17  . EKG 12-Lead     Hospital Course: Patient was admitted to [facility] and taken to the OR and underwent the above state procedure without complications.  Patient tolerated the procedure well and was later transferred to the recovery room and then to the orthopaedic floor for postoperative care.  They were given PO and IV analgesics for pain control following their surgery.  They were given 24 hours of postoperative antibiotics of  Anti-infectives (From admission, onward)   Start     Dose/Rate Route Frequency Ordered Stop   09/28/17 2200  vancomycin (VANCOCIN) IVPB 1000 mg/200 mL premix     1,000 mg 200 mL/hr over 60 Minutes Intravenous Every 12 hours 09/28/17 1353 09/29/17 0011   09/28/17 0730  vancomycin (VANCOCIN) 1-5 GM/200ML-% IVPB    Comments:  [place] Session   : cabinet override      09/28/17 0730 09/28/17 1944   09/28/17 0727  vancomycin (VANCOCIN) IVPB 1000 mg/200 mL premix     1,000 mg 200 mL/hr over 60 Minutes Intravenous On call to O.R. 09/28/17 8832 09/28/17 1008     and started on DVT prophylaxis in the form of Xarelto.    PT and OT were ordered for total hip protocol.  The patient was allowed to be WBAT with therapy. Discharge planning was consulted to help with postop disposition and equipment needs.  Patient had a good night on the evening of surgery.  They started to get up OOB with therapy on day one.  Hemovac drain was pulled without difficulty.   Dressing was checked and was clean and dry.  Patient was seen in rounds and was ready to go home later that same day.  Discharge home with home health Diet - Cardiac diet Follow up - in 2 weeks Activity - WBAT Disposition - Home Condition Upon Discharge - Stable D/C Meds - See DC Summary DVT Prophylaxis - Xarelto     Discharge Instructions    Call MD / Call 911   Complete by:  As directed    If you experience chest pain or shortness of breath, CALL 911 and be transported to the hospital emergency room.  If you develope a fever above 101 F, pus (white drainage) or increased drainage or redness at the wound, or calf pain, call your surgeon's office.   Change dressing   Complete by:  As directed    You may change your dressing dressing daily with sterile 4 x 4 inch gauze dressing and paper tape.  Do not submerge the incision under water.   Constipation Prevention   Complete by:  As directed    Drink plenty of fluids.  Prune juice may be helpful.  You may use a stool softener, such as Colace (over the counter) 100 mg twice a day.  Use MiraLax (over the counter) for constipation as needed.   Diet - low sodium heart healthy   Complete by:  As directed    Discharge instructions   Complete by:  As directed  Take Xarelto for two and a half more weeks, then discontinue Xarelto. Once the patient has completed the blood thinner regimen, then take a Baby 81 mg Aspirin daily for three more weeks.   Pick up stool softner and laxative for home use following surgery while on pain medications. Do not submerge incision under water. Please use good hand washing  techniques while changing dressing each day. May shower starting three days after surgery. Please use a clean towel to pat the incision dry following showers. Continue to use ice for pain and swelling after surgery. Do not use any lotions or creams on the incision until instructed by your surgeon.  Wear both TED hose on both legs during the day every day for three weeks, but may remove the TED hose at night at home.  Postoperative Constipation Protocol  Constipation - defined medically as fewer than three stools per week and severe constipation as less than one stool per week.  One of the most common issues patients have following surgery is constipation.  Even if you have a regular bowel pattern at home, your normal regimen is likely to be disrupted due to multiple reasons following surgery.  Combination of anesthesia, postoperative narcotics, change in appetite and fluid intake all can affect your bowels.  In order to avoid complications following surgery, here are some recommendations in order to help you during your recovery period.  Colace (docusate) - Pick up an over-the-counter form of Colace or another stool softener and take twice a day as long as you are requiring postoperative pain medications.  Take with a full glass of water daily.  If you experience loose stools or diarrhea, hold the colace until you stool forms back up.  If your symptoms do not get better within 1 week or if they get worse, check with your doctor.  Dulcolax (bisacodyl) - Pick up over-the-counter and take as directed by the product packaging as needed to assist with the movement of your bowels.  Take with a full glass of water.  Use this product as needed if not relieved by Colace only.   MiraLax (polyethylene glycol) - Pick up over-the-counter to have on hand.  MiraLax is a solution that will increase the amount of water in your bowels to assist with bowel movements.  Take as directed and can mix with a glass of  water, juice, soda, coffee, or tea.  Take if you go more than two days without a movement. Do not use MiraLax more than once per day. Call your doctor if you are still constipated or irregular after using this medication for 7 days in a row.  If you continue to have problems with postoperative constipation, please contact the office for further assistance and recommendations.  If you experience "the worst abdominal pain ever" or develop nausea or vomiting, please contact the office immediatly for further recommendations for treatment.   Do not sit on low chairs, stoools or toilet seats, as it may be difficult to get up from low surfaces   Complete by:  As directed    Driving restrictions   Complete by:  As directed    No driving until released by the physician.   Increase activity slowly as tolerated   Complete by:  As directed    Lifting restrictions   Complete by:  As directed    No lifting until released by the physician.   Patient may shower   Complete by:  As directed    You may  shower without a dressing once there is no drainage.  Do not wash over the wound.  If drainage remains, do not shower until drainage stops.   TED hose   Complete by:  As directed    Use stockings (TED hose) for 3 weeks on both leg(s).  You may remove them at night for sleeping.   Weight bearing as tolerated   Complete by:  As directed    Laterality:  right   Extremity:  Lower     Allergies as of 09/29/2017      Reactions   Bee Venom Swelling   Yellow jackets, white and black face hornets, local swelling only   Codeine Nausea And Vomiting   Penicillins Itching, Swelling   Has patient had a PCN reaction causing immediate rash, facial/tongue/throat swelling, SOB or lightheadedness with hypotension: Yes Has patient had a PCN reaction causing severe rash involving mucus membranes or skin necrosis: No Has patient had a PCN reaction that required hospitalization: No Has patient had a PCN reaction occurring  within the last 10 years: No If all of the above answers are "NO", then may proceed with Cephalosporin use.   Lodine [etodolac] Rash   Tramadol Itching, Rash      Medication List    STOP taking these medications   BIOTIN 5000 5 MG Caps Generic drug:  Biotin   CENTRUM SILVER PO   COQ-10 PO   estradiol 0.5 MG tablet Commonly known as:  ESTRACE   medroxyPROGESTERone 2.5 MG tablet Commonly known as:  PROVERA   PROBIOTIC DAILY PO   Vitamin D 2000 units Caps     TAKE these medications   acetaminophen 500 MG tablet Commonly known as:  TYLENOL Take 1,000 mg by mouth 3 (three) times daily.   carbidopa-levodopa 25-100 MG tablet Commonly known as:  SINEMET IR Take 1 tablet by mouth 3 (three) times daily.   diazepam 5 MG tablet Commonly known as:  VALIUM Take 1 tablet (5 mg total) by mouth every 12 (twelve) hours as needed for anxiety. What changed:  when to take this   EPINEPHrine 0.3 mg/0.3 mL Soaj injection Commonly known as:  EPI-PEN Inject 0.3 mLs (0.3 mg total) into the muscle once.   hydrochlorothiazide 25 MG tablet Commonly known as:  HYDRODIURIL Take 1.5 tablets (37.5 mg total) daily by mouth.   loratadine 10 MG tablet Commonly known as:  CLARITIN Take 10 mg by mouth daily.   methocarbamol 500 MG tablet Commonly known as:  ROBAXIN Take 1 tablet (500 mg total) every 6 (six) hours as needed by mouth for muscle spasms.   oxyCODONE 5 MG immediate release tablet Commonly known as:  Oxy IR/ROXICODONE Take 1-2 tablets (5-10 mg total) every 4 (four) hours as needed by mouth for moderate pain or severe pain.   rivaroxaban 10 MG Tabs tablet Commonly known as:  XARELTO Take 1 tablet (10 mg total) daily with breakfast by mouth. Take Xarelto for two and a half more weeks following discharge from the hospital, then discontinue Xarelto. Once the patient has completed the blood thinner regimen, then take a Baby 81 mg Aspirin daily for three more weeks.   sertraline 25  MG tablet Commonly known as:  ZOLOFT TAKE 1 TABLET BY MOUTH EVERY MORNING   SYSTANE 0.4-0.3 % Soln Generic drug:  Polyethyl Glycol-Propyl Glycol Apply 1 drop to eye 2 (two) times daily.            Discharge Care Instructions  (From admission, onward)  Start     Ordered   09/29/17 0000  Weight bearing as tolerated    Question Answer Comment  Laterality right   Extremity Lower      09/29/17 0748   09/29/17 0000  Change dressing    Comments:  You may change your dressing dressing daily with sterile 4 x 4 inch gauze dressing and paper tape.  Do not submerge the incision under water.   09/29/17 0748     Follow-up Information    Gaynelle Arabian, MD. Schedule an appointment as soon as possible for a visit on 10/11/2017.   Specialty:  Orthopedic Surgery Contact information: [address] [place] [address] [phone]           Signed: Arlee Muslim, PA-C Orthopaedic Surgery 09/29/2017, 7:49 AM

## 2017-09-29 NOTE — Progress Notes (Signed)
Subjective: 1 Day Post-Op Procedure(s) (LRB): RIGHT TOTAL HIP ARTHROPLASTY ANTERIOR APPROACH (Right) Patient reports pain as mild.   Patient seen in rounds by Dr. Wynelle Link. Patient is well, but has had some minor complaints of pain in the hip', requiring pain medications We will start therapy today.  If they do well with therapy and meets all goals, then will allow home later this afternoon following therapy. Plan is to go Home after hospital stay.  Objective: Vital signs in last 24 hours: Temp:  [96.8 F (36 C)-98.1 F (36.7 C)] 98.1 F (36.7 C) (11/15 0513) Pulse Rate:  [63-83] 72 (11/15 0513) Resp:  [11-20] 16 (11/15 0513) BP: (104-166)/(57-87) 129/77 (11/15 0513) SpO2:  [97 %-100 %] 98 % (11/15 0513) Weight:  [56.7 kg (125 lb)] 56.7 kg (125 lb) (11/14 0805)  Intake/Output from previous day:  Intake/Output Summary (Last 24 hours) at 09/29/2017 0741 Last data filed at 09/29/2017 0637 Gross per 24 hour  Intake 4640 ml  Output 2190 ml  Net 2450 ml    Intake/Output this shift: No intake/output data recorded.  Labs: Recent Labs    09/29/17 0511  HGB 11.7*   Recent Labs    09/29/17 0511  WBC 9.2  RBC 3.80*  HCT 34.5*  PLT 164   Recent Labs    09/29/17 0511  NA 133*  K 3.8  CL 99*  CO2 29  BUN 12  CREATININE 0.63  GLUCOSE 146*  CALCIUM 8.5*   No results for input(s): LABPT, INR in the last 72 hours.  EXAM General - Patient is Alert, Appropriate and Oriented Extremity - Neurovascular intact Sensation intact distally Intact pulses distally Dorsiflexion/Plantar flexion intact Dressing - dressing C/D/I Motor Function - intact, moving foot and toes well on exam.  Hemovac pulled without difficulty.  Past Medical History:  Diagnosis Date  . Arthropathy of cervical spine ([place]) 11/27/2012   Right  Xray at [facility]  On 04/08/2016  AP, lateral, and lateral flexion and extension views of the cervical spine are submitted for evaluation.   No acute fracture  identified in the cervical spine.   There is redemonstration of approximately 2 mm of C3 on C4 anterolisthesis in neutral which slightly increases in flexion relative to neutral and extension. Slight C5 on C6 retrolisthesis does not change i  . Benign hypertension without congestive heart failure   . Benign mole    right hcets mole, bleeds when dried with towel  . Bilateral dry eyes   . Bladder cancer ([place])   . Bladder cancer ([place]) 10/17/2016  . Cervical spondylosis   . Cervicalgia   . Depression with anxiety 10/17/2016  . Gross hematuria   . History of kidney stones    1970's  . Lumbar stenosis   . Malignant tumor of urinary bladder ([place]) 07/2016   Pre-stage I  . OA (osteoarthritis)   . Parkinson disease [facility]) dx 2012   neurologist-  dr siddiqui at [facility]    Assessment/Plan: 1 Day Post-Op Procedure(s) (LRB): RIGHT TOTAL HIP ARTHROPLASTY ANTERIOR APPROACH (Right) Active Problems:   OA (osteoarthritis) of hip  Estimated body mass index is 22.14 kg/m as calculated from the following:   Height as of this encounter: 5\' 3"  (1.6 m).   Weight as of this encounter: 56.7 kg (125 lb). Advance diet Up with therapy Discharge home with home health  DVT Prophylaxis - Xarelto Weight Bearing As Tolerated right Leg Hemovac Pulled Begin Therapy  If meets goals and able to go home:  Discharge home with home health Diet - Cardiac diet Follow up - in 2 weeks Activity - WBAT Disposition - Home Condition Upon Discharge - Stable D/C Meds - See DC Summary DVT Prophylaxis - Xarelto  Arlee Muslim, PA-C Orthopaedic Surgery 09/29/2017, 7:41 AM

## 2017-09-29 NOTE — Progress Notes (Signed)
Discharge planning, spoke with patient at bedside. Have chosen [facility] for HH PT. Contacted [facility] for referral. Has RW and 3n1. [phone] 

## 2017-10-01 DIAGNOSIS — I119 Hypertensive heart disease without heart failure: Secondary | ICD-10-CM | POA: Diagnosis not present

## 2017-10-01 DIAGNOSIS — M47812 Spondylosis without myelopathy or radiculopathy, cervical region: Secondary | ICD-10-CM | POA: Diagnosis not present

## 2017-10-01 DIAGNOSIS — Z471 Aftercare following joint replacement surgery: Secondary | ICD-10-CM | POA: Diagnosis not present

## 2017-10-01 DIAGNOSIS — G20 Parkinson's disease: Secondary | ICD-10-CM | POA: Diagnosis not present

## 2017-10-01 DIAGNOSIS — M4316 Spondylolisthesis, lumbar region: Secondary | ICD-10-CM | POA: Diagnosis not present

## 2017-10-01 DIAGNOSIS — M1711 Unilateral primary osteoarthritis, right knee: Secondary | ICD-10-CM | POA: Diagnosis not present

## 2017-10-03 DIAGNOSIS — I119 Hypertensive heart disease without heart failure: Secondary | ICD-10-CM | POA: Diagnosis not present

## 2017-10-03 DIAGNOSIS — M47812 Spondylosis without myelopathy or radiculopathy, cervical region: Secondary | ICD-10-CM | POA: Diagnosis not present

## 2017-10-03 DIAGNOSIS — Z471 Aftercare following joint replacement surgery: Secondary | ICD-10-CM | POA: Diagnosis not present

## 2017-10-03 DIAGNOSIS — M1711 Unilateral primary osteoarthritis, right knee: Secondary | ICD-10-CM | POA: Diagnosis not present

## 2017-10-03 DIAGNOSIS — G20 Parkinson's disease: Secondary | ICD-10-CM | POA: Diagnosis not present

## 2017-10-03 DIAGNOSIS — M4316 Spondylolisthesis, lumbar region: Secondary | ICD-10-CM | POA: Diagnosis not present

## 2017-10-04 ENCOUNTER — Ambulatory Visit: Payer: BLUE CROSS/BLUE SHIELD | Admitting: Family Medicine

## 2017-10-05 DIAGNOSIS — I119 Hypertensive heart disease without heart failure: Secondary | ICD-10-CM | POA: Diagnosis not present

## 2017-10-05 DIAGNOSIS — M47812 Spondylosis without myelopathy or radiculopathy, cervical region: Secondary | ICD-10-CM | POA: Diagnosis not present

## 2017-10-05 DIAGNOSIS — G20 Parkinson's disease: Secondary | ICD-10-CM | POA: Diagnosis not present

## 2017-10-05 DIAGNOSIS — Z471 Aftercare following joint replacement surgery: Secondary | ICD-10-CM | POA: Diagnosis not present

## 2017-10-05 DIAGNOSIS — M1711 Unilateral primary osteoarthritis, right knee: Secondary | ICD-10-CM | POA: Diagnosis not present

## 2017-10-05 DIAGNOSIS — M4316 Spondylolisthesis, lumbar region: Secondary | ICD-10-CM | POA: Diagnosis not present

## 2017-10-07 DIAGNOSIS — Z471 Aftercare following joint replacement surgery: Secondary | ICD-10-CM | POA: Diagnosis not present

## 2017-10-07 DIAGNOSIS — I119 Hypertensive heart disease without heart failure: Secondary | ICD-10-CM | POA: Diagnosis not present

## 2017-10-07 DIAGNOSIS — G20 Parkinson's disease: Secondary | ICD-10-CM | POA: Diagnosis not present

## 2017-10-07 DIAGNOSIS — M4316 Spondylolisthesis, lumbar region: Secondary | ICD-10-CM | POA: Diagnosis not present

## 2017-10-07 DIAGNOSIS — M47812 Spondylosis without myelopathy or radiculopathy, cervical region: Secondary | ICD-10-CM | POA: Diagnosis not present

## 2017-10-07 DIAGNOSIS — M1711 Unilateral primary osteoarthritis, right knee: Secondary | ICD-10-CM | POA: Diagnosis not present

## 2017-10-10 DIAGNOSIS — G20 Parkinson's disease: Secondary | ICD-10-CM | POA: Diagnosis not present

## 2017-10-10 DIAGNOSIS — M1711 Unilateral primary osteoarthritis, right knee: Secondary | ICD-10-CM | POA: Diagnosis not present

## 2017-10-10 DIAGNOSIS — M47812 Spondylosis without myelopathy or radiculopathy, cervical region: Secondary | ICD-10-CM | POA: Diagnosis not present

## 2017-10-10 DIAGNOSIS — M4316 Spondylolisthesis, lumbar region: Secondary | ICD-10-CM | POA: Diagnosis not present

## 2017-10-10 DIAGNOSIS — I119 Hypertensive heart disease without heart failure: Secondary | ICD-10-CM | POA: Diagnosis not present

## 2017-10-10 DIAGNOSIS — Z471 Aftercare following joint replacement surgery: Secondary | ICD-10-CM | POA: Diagnosis not present

## 2017-10-11 DIAGNOSIS — M1611 Unilateral primary osteoarthritis, right hip: Secondary | ICD-10-CM | POA: Diagnosis not present

## 2017-10-14 DIAGNOSIS — C679 Malignant neoplasm of bladder, unspecified: Secondary | ICD-10-CM | POA: Diagnosis not present

## 2017-10-14 DIAGNOSIS — C672 Malignant neoplasm of lateral wall of bladder: Secondary | ICD-10-CM | POA: Diagnosis not present

## 2017-10-17 DIAGNOSIS — C672 Malignant neoplasm of lateral wall of bladder: Secondary | ICD-10-CM | POA: Diagnosis not present

## 2017-10-17 DIAGNOSIS — C641 Malignant neoplasm of right kidney, except renal pelvis: Secondary | ICD-10-CM | POA: Diagnosis not present

## 2017-11-02 DIAGNOSIS — D2272 Melanocytic nevi of left lower limb, including hip: Secondary | ICD-10-CM | POA: Diagnosis not present

## 2017-11-02 DIAGNOSIS — L821 Other seborrheic keratosis: Secondary | ICD-10-CM | POA: Diagnosis not present

## 2017-11-02 DIAGNOSIS — Z23 Encounter for immunization: Secondary | ICD-10-CM | POA: Diagnosis not present

## 2017-11-02 DIAGNOSIS — D2271 Melanocytic nevi of right lower limb, including hip: Secondary | ICD-10-CM | POA: Diagnosis not present

## 2017-11-02 DIAGNOSIS — L309 Dermatitis, unspecified: Secondary | ICD-10-CM | POA: Diagnosis not present

## 2017-11-10 DIAGNOSIS — Z96641 Presence of right artificial hip joint: Secondary | ICD-10-CM | POA: Diagnosis not present

## 2017-11-10 DIAGNOSIS — Z471 Aftercare following joint replacement surgery: Secondary | ICD-10-CM | POA: Diagnosis not present

## 2017-11-10 DIAGNOSIS — M1611 Unilateral primary osteoarthritis, right hip: Secondary | ICD-10-CM | POA: Diagnosis not present

## 2017-11-16 ENCOUNTER — Encounter: Payer: Self-pay | Admitting: Cardiology

## 2017-11-16 ENCOUNTER — Ambulatory Visit (INDEPENDENT_AMBULATORY_CARE_PROVIDER_SITE_OTHER): Payer: Medicare Other | Admitting: Cardiology

## 2017-11-16 VITALS — BP 136/66 | HR 75 | Ht 63.0 in | Wt 124.1 lb

## 2017-11-16 DIAGNOSIS — I119 Hypertensive heart disease without heart failure: Secondary | ICD-10-CM | POA: Diagnosis not present

## 2017-11-16 DIAGNOSIS — E7800 Pure hypercholesterolemia, unspecified: Secondary | ICD-10-CM | POA: Diagnosis not present

## 2017-11-16 DIAGNOSIS — G20 Parkinson's disease: Secondary | ICD-10-CM

## 2017-11-16 NOTE — Progress Notes (Signed)
Cardiology Office Note:    Date:  11/16/2017   ID:  Stephanie [name], DOB 10-13-1932, MRN [id number]  PCP:  Mosie Lukes, MD  Cardiologist:  Jenne Campus, MD    Referring MD: Mosie Lukes, MD   Chief Complaint  Patient presents with  . Follow-up    2 month f/u  Doing well denies have any chest pain tightness squeezing pressure burning chest no palpitations  History of Present Illness:    Stephanie [name] is a 84 y.o. female doing great she went through hip replacement surgery with no difficulties.  Now recovered walks on the regular basis and doing well  Past Medical History:  Diagnosis Date  . Arthropathy of cervical spine ([place]) 11/27/2012   Right  Xray at [facility]  On 04/08/2016  AP, lateral, and lateral flexion and extension views of the cervical spine are submitted for evaluation.   No acute fracture identified in the cervical spine.   There is redemonstration of approximately 2 mm of C3 on C4 anterolisthesis in neutral which slightly increases in flexion relative to neutral and extension. Slight C5 on C6 retrolisthesis does not change i  . Benign hypertension without congestive heart failure   . Benign mole    right hcets mole, bleeds when dried with towel  . Bilateral dry eyes   . Bladder cancer ([place])   . Bladder cancer ([place]) 10/17/2016  . Cervical spondylosis   . Cervicalgia   . Depression with anxiety 10/17/2016  . Gross hematuria   . History of kidney stones    1970's  . Lumbar stenosis   . Malignant tumor of urinary bladder ([place]) 07/2016   Pre-stage I  . OA (osteoarthritis)   . Parkinson disease [facility]) dx 2012   neurologist-  dr siddiqui at [facility]    Past Surgical History:  Procedure Laterality Date  . BIOPSY MASS LEFT FIRST METACARPAL   06/23/2006   benign  . CATARACT EXTRACTION W/ INTRAOCULAR LENS  IMPLANT, BILATERAL  1999 and 2003  . CERVICAL FUSION  02/2017   c1-c2 ; reports she has 2 screws 2in long in place done at [facility] ; patient  exhibits VERY LIMITED NECK ROM  . CYSTOSCOPY W/ RETROGRADES Bilateral 09/22/2016   Procedure: CYSTOSCOPY WITH RETROGRADE PYELOGRAM;  Surgeon: Alexis Frock, MD;  Location: [facility];  Service: Urology;  Laterality: Bilateral;  . D & C HYSTEROSCOPY W/ RESECTION POLYP  07/11/2000  . DILATION AND CURETTAGE OF UTERUS  1960's  . EXCISION CYST AND DEBRIDEMENT RIGHT WIRST AND REMOVAL [place] BODY  01/09/2009  . LAPAROSCOPY  yrs ago   infertility   . METACARPOPHALANGEAL JOINT ARTHRODESIS Right 05/25/1996  . TONSILLECTOMY AND ADENOIDECTOMY  child  . TOTAL HIP ARTHROPLASTY Left 07/07/2016   Procedure: LEFT TOTAL HIP ARTHROPLASTY ANTERIOR APPROACH;  Surgeon: Gaynelle Arabian, MD;  Location: [facility];  Service: Orthopedics;  Laterality: Left;  . TOTAL HIP ARTHROPLASTY Right 09/28/2017   Procedure: RIGHT TOTAL HIP ARTHROPLASTY ANTERIOR APPROACH;  Surgeon: Gaynelle Arabian, MD;  Location: [facility];  Service: Orthopedics;  Laterality: Right;  . TRANSURETHRAL RESECTION OF BLADDER TUMOR N/A 09/22/2016   Procedure: TRANSURETHRAL RESECTION OF BLADDER TUMOR (TURBT);  Surgeon: Alexis Frock, MD;  Location[facility];  Service: Urology;  Laterality: N/A;    Current Medications: Current Meds  Medication Sig  . acetaminophen (TYLENOL) 500 MG tablet Take 1,000 mg by mouth 3 (three) times daily.   . ACIDOPHILUS LACTOBACILLUS PO Take by mouth.  Marland Kitchen  Biotin 5000 MCG SUBL Place under the tongue.  . carbidopa-levodopa (SINEMET IR) 25-100 MG per tablet Take 1 tablet by mouth 3 (three) times daily.   . Cholecalciferol (VITAMIN D-1000 MAX ST) 1000 units tablet Take by mouth.  Marland Kitchen Co-Enzyme Q-10 30 MG CAPS Take by mouth.  . diazepam (VALIUM) 5 MG tablet Take 1 tablet (5 mg total) by mouth every 12 (twelve) hours as needed for anxiety. (Patient taking differently: Take 5 mg by mouth at bedtime. )  . diclofenac (VOLTAREN) 50 MG EC tablet Take by mouth.  . EPINEPHrine 0.3 mg/0.3 mL IJ SOAJ injection  Inject 0.3 mLs (0.3 mg total) into the muscle once. (Patient taking differently: Inject 0.3 mg into the muscle once. )  . hydrochlorothiazide (HYDRODIURIL) 25 MG tablet Take 1.5 tablets (37.5 mg total) daily by mouth.  . loratadine (CLARITIN) 10 MG tablet Take 10 mg by mouth daily.  . methocarbamol (ROBAXIN) 500 MG tablet Take 1 tablet (500 mg total) every 6 (six) hours as needed by mouth for muscle spasms.  . Multiple Vitamin (MULTI-VITAMINS) TABS Take by mouth.  Vladimir Faster Glycol-Propyl Glycol (SYSTANE) 0.4-0.3 % SOLN Apply 1 drop to eye 2 (two) times daily.   Marland Kitchen senna-docusate (SENOKOT-S) 8.6-50 MG tablet Take by mouth.  . sertraline (ZOLOFT) 25 MG tablet TAKE 1 TABLET BY MOUTH EVERY MORNING     Allergies:   Bee venom; Codeine; Penicillins; Lodine [etodolac]; and Tramadol   Social History   Socioeconomic History  . Marital status: Widowed    Spouse name: None  . Number of children: None  . Years of education: None  . Highest education level: None  Social Needs  . Financial resource strain: None  . Food insecurity - worry: None  . Food insecurity - inability: None  . Transportation needs - medical: None  . Transportation needs - non-medical: None  Occupational History  . None  Tobacco Use  . Smoking status: Former Smoker    Years: 10.00    Last attempt to quit: 05/04/1964    Years since quitting: 53.5  . Smokeless tobacco: Never Used  Substance and Sexual Activity  . Alcohol use: Yes    Alcohol/week: 6.6 oz    Types: 7 Glasses of wine, 4 Standard drinks or equivalent per week    Comment: social  . Drug use: No  . Sexual activity: No    Partners: Male  Other Topics Concern  . None  Social History Narrative  . None     Family History: The patient's family history includes Arthritis in her father, mother, and sister; Deep vein thrombosis in her mother; Diabetes in her sister; Heart disease in her maternal grandfather, maternal grandmother, and sister; Hypertension in  her mother and sister; Obesity in her sister; Peripheral vascular disease in her paternal grandmother; Stroke in her father and paternal grandfather. ROS:   Please see the history of present illness.    All 14 point review of systems negative except as described per history of present illness  EKGs/Labs/Other Studies Reviewed:      Recent Labs: 01/04/2017: TSH 0.84 09/21/2017: ALT 19 09/29/2017: BUN 12; Creatinine, Ser 0.63; Hemoglobin 11.7; Platelets 164; Potassium 3.8; Sodium 133  Recent Lipid Panel    Component Value Date/Time   CHOL 178 01/04/2017 1352   TRIG 198.0 (H) 01/04/2017 1352   HDL 44.30 01/04/2017 1352   CHOLHDL 4 01/04/2017 1352   VLDL 39.6 01/04/2017 1352   LDLCALC 94 01/04/2017 1352   LDLDIRECT 120.0 12/11/2014  1040    Physical Exam:    VS:  BP 136/66 (BP Location: Right Arm, Patient Position: Sitting)   Pulse 75   Ht 5\' 3"  (1.6 m)   Wt 124 lb 1.9 oz (56.3 kg)   LMP 01/13/2013 Comment: spotting-had benign endometrial biopsy   SpO2 98%   BMI 21.99 kg/m     Wt Readings from Last 3 Encounters:  11/16/17 124 lb 1.9 oz (56.3 kg)  09/28/17 125 lb (56.7 kg)  09/21/17 125 lb 9.6 oz (57 kg)     GEN:  Well nourished, well developed in no acute distress HEENT: Normal NECK: No JVD; No carotid bruits LYMPHATICS: No lymphadenopathy CARDIAC: RRR, no murmurs, no rubs, no gallops RESPIRATORY:  Clear to auscultation without rales, wheezing or rhonchi  ABDOMEN: Soft, non-tender, non-distended MUSCULOSKELETAL:  No edema; No deformity  SKIN: Warm and dry LOWER EXTREMITIES: no swelling NEUROLOGIC:  Alert and oriented x 3 PSYCHIATRIC:  Normal affect   ASSESSMENT:    1. Benign hypertensive heart disease without heart failure   2. Parkinson's disease ([place])   3. Pure hypercholesterolemia    PLAN:    In order of problems listed above:  1. Essential hypertension: Blood pressure well controlled continue present management. 2. Parkinson's disease followed by  internal medicine team and neurology. 3. Dyslipidemia: We will continue monitoring.  Overall cardiac wise she is doing well I see her back in 6 months or sooner if she has a problem.   Medication Adjustments/Labs and Tests Ordered: Current medicines are reviewed at length with the patient today.  Concerns regarding medicines are outlined above.  No orders of the defined types were placed in this encounter.  Medication changes: No orders of the defined types were placed in this encounter.   Signed, Park Liter, MD, [facility] 11/16/2017 11:10 AM    [place]

## 2017-11-16 NOTE — Patient Instructions (Signed)

## 2017-11-20 ENCOUNTER — Other Ambulatory Visit: Payer: Self-pay | Admitting: Family Medicine

## 2017-11-30 ENCOUNTER — Other Ambulatory Visit: Payer: Self-pay | Admitting: Family Medicine

## 2017-12-28 DIAGNOSIS — Z87891 Personal history of nicotine dependence: Secondary | ICD-10-CM | POA: Diagnosis not present

## 2017-12-28 DIAGNOSIS — G20 Parkinson's disease: Secondary | ICD-10-CM | POA: Diagnosis not present

## 2017-12-28 DIAGNOSIS — Z79899 Other long term (current) drug therapy: Secondary | ICD-10-CM | POA: Diagnosis not present

## 2017-12-28 DIAGNOSIS — R413 Other amnesia: Secondary | ICD-10-CM | POA: Diagnosis not present

## 2018-01-16 ENCOUNTER — Other Ambulatory Visit: Payer: Self-pay | Admitting: Family Medicine

## 2018-01-16 DIAGNOSIS — H04123 Dry eye syndrome of bilateral lacrimal glands: Secondary | ICD-10-CM | POA: Diagnosis not present

## 2018-01-16 DIAGNOSIS — H35371 Puckering of macula, right eye: Secondary | ICD-10-CM | POA: Diagnosis not present

## 2018-01-16 DIAGNOSIS — Z961 Presence of intraocular lens: Secondary | ICD-10-CM | POA: Diagnosis not present

## 2018-01-17 DIAGNOSIS — C672 Malignant neoplasm of lateral wall of bladder: Secondary | ICD-10-CM | POA: Diagnosis not present

## 2018-01-17 DIAGNOSIS — C641 Malignant neoplasm of right kidney, except renal pelvis: Secondary | ICD-10-CM | POA: Diagnosis not present

## 2018-01-17 DIAGNOSIS — R310 Gross hematuria: Secondary | ICD-10-CM | POA: Diagnosis not present

## 2018-02-02 DIAGNOSIS — M1611 Unilateral primary osteoarthritis, right hip: Secondary | ICD-10-CM | POA: Diagnosis not present

## 2018-02-02 DIAGNOSIS — M1711 Unilateral primary osteoarthritis, right knee: Secondary | ICD-10-CM | POA: Diagnosis not present

## 2018-02-13 ENCOUNTER — Ambulatory Visit: Payer: Medicare Other | Admitting: Podiatry

## 2018-02-15 DIAGNOSIS — M71571 Other bursitis, not elsewhere classified, right ankle and foot: Secondary | ICD-10-CM | POA: Diagnosis not present

## 2018-02-15 DIAGNOSIS — M2041 Other hammer toe(s) (acquired), right foot: Secondary | ICD-10-CM | POA: Diagnosis not present

## 2018-02-21 ENCOUNTER — Other Ambulatory Visit: Payer: Self-pay | Admitting: Family Medicine

## 2018-02-25 ENCOUNTER — Other Ambulatory Visit: Payer: Self-pay | Admitting: Obstetrics & Gynecology

## 2018-02-27 NOTE — Telephone Encounter (Signed)
Medication refill request: Estradiol 0.5mg  #45, 2R Last AEX:  02-18-17 Next AEX: 05-16-18 Last MMG (if hormonal medication request): 04-06-17 [id number] Refill authorized: Please advise--Note on refill states this was discontinued 09-29-17 upon discharge from hospital by Laverta Baltimore, PA-C.  Medication refill request: Provera 2.5mg  #45, 1R Last AEX:  02-18-17 Next AEX: 05-16-18 Last MMG (if hormonal medication request): 04-06-17 [id number] Refill authorized: Please advise--Note on refill states this was discontinued 09-29-17 upon discharge from hospital by Laverta Baltimore, PA-C

## 2018-03-07 DIAGNOSIS — M50323 Other cervical disc degeneration at C6-C7 level: Secondary | ICD-10-CM | POA: Diagnosis not present

## 2018-03-07 DIAGNOSIS — M545 Low back pain: Secondary | ICD-10-CM | POA: Diagnosis not present

## 2018-03-07 DIAGNOSIS — Z9889 Other specified postprocedural states: Secondary | ICD-10-CM | POA: Diagnosis not present

## 2018-03-07 DIAGNOSIS — M542 Cervicalgia: Secondary | ICD-10-CM | POA: Diagnosis not present

## 2018-03-07 DIAGNOSIS — M50321 Other cervical disc degeneration at C4-C5 level: Secondary | ICD-10-CM | POA: Diagnosis not present

## 2018-03-07 DIAGNOSIS — Z981 Arthrodesis status: Secondary | ICD-10-CM | POA: Diagnosis not present

## 2018-03-07 DIAGNOSIS — M50322 Other cervical disc degeneration at C5-C6 level: Secondary | ICD-10-CM | POA: Diagnosis not present

## 2018-03-07 DIAGNOSIS — Z4789 Encounter for other orthopedic aftercare: Secondary | ICD-10-CM | POA: Diagnosis not present

## 2018-03-13 ENCOUNTER — Other Ambulatory Visit: Payer: Self-pay | Admitting: Family Medicine

## 2018-03-13 ENCOUNTER — Ambulatory Visit (INDEPENDENT_AMBULATORY_CARE_PROVIDER_SITE_OTHER): Payer: Medicare Other | Admitting: Family Medicine

## 2018-03-13 ENCOUNTER — Encounter: Payer: Self-pay | Admitting: Family Medicine

## 2018-03-13 DIAGNOSIS — I10 Essential (primary) hypertension: Secondary | ICD-10-CM | POA: Diagnosis not present

## 2018-03-13 DIAGNOSIS — G20 Parkinson's disease: Secondary | ICD-10-CM

## 2018-03-13 DIAGNOSIS — D649 Anemia, unspecified: Secondary | ICD-10-CM

## 2018-03-13 DIAGNOSIS — G20A1 Parkinson's disease without dyskinesia, without mention of fluctuations: Secondary | ICD-10-CM

## 2018-03-13 DIAGNOSIS — M1990 Unspecified osteoarthritis, unspecified site: Secondary | ICD-10-CM | POA: Diagnosis not present

## 2018-03-13 DIAGNOSIS — R232 Flushing: Secondary | ICD-10-CM | POA: Diagnosis not present

## 2018-03-13 DIAGNOSIS — M542 Cervicalgia: Secondary | ICD-10-CM

## 2018-03-13 DIAGNOSIS — E7800 Pure hypercholesterolemia, unspecified: Secondary | ICD-10-CM | POA: Diagnosis not present

## 2018-03-13 NOTE — Assessment & Plan Note (Addendum)
Right knee acting up.notes pain and a significan baker's cyst. Has an appointment with her orthopaedist soon to discuss options.

## 2018-03-13 NOTE — Assessment & Plan Note (Signed)
Increase leafy greens, consider increased lean red meat and using cast iron cookware. Continue to monitor, report any concerns 

## 2018-03-13 NOTE — Assessment & Plan Note (Signed)
Released by Dr Redmond Pulling of Neurosurgery s/p surgery a year ago. Doing better

## 2018-03-13 NOTE — Assessment & Plan Note (Signed)
Well controlled, no changes to meds. Encouraged heart healthy diet such as the DASH diet and exercise as tolerated.  °

## 2018-03-13 NOTE — Assessment & Plan Note (Signed)
Tolerating statin, encouraged heart healthy diet, avoid trans fats, minimize simple carbs and saturated fats. Increase exercise as tolerated 

## 2018-03-13 NOTE — Patient Instructions (Signed)
Add Lidocaine gel to Voltaren and follow up with ortho Hypertension Hypertension, commonly called high blood pressure, is when the force of blood pumping through the arteries is too strong. The arteries are the blood vessels that carry blood from the heart throughout the body. Hypertension forces the heart to work harder to pump blood and may cause arteries to become narrow or stiff. Having untreated or uncontrolled hypertension can cause heart attacks, strokes, kidney disease, and other problems. A blood pressure reading consists of a higher number over a lower number. Ideally, your blood pressure should be below 120/80. The first ("top") number is called the systolic pressure. It is a measure of the pressure in your arteries as your heart beats. The second ("bottom") number is called the diastolic pressure. It is a measure of the pressure in your arteries as the heart relaxes. What are the causes? The cause of this condition is not known. What increases the risk? Some risk factors for high blood pressure are under your control. Others are not. Factors you can change  Smoking.  Having type 2 diabetes mellitus, high cholesterol, or both.  Not getting enough exercise or physical activity.  Being overweight.  Having too much fat, sugar, calories, or salt (sodium) in your diet.  Drinking too much alcohol. Factors that are difficult or impossible to change  Having chronic kidney disease.  Having a family history of high blood pressure.  Age. Risk increases with age.  Race. You may be at higher risk if you are African-American.  Gender. Men are at higher risk than women before age 2. After age 51, women are at higher risk than men.  Having obstructive sleep apnea.  Stress. What are the signs or symptoms? Extremely high blood pressure (hypertensive crisis) may cause:  Headache.  Anxiety.  Shortness of breath.  Nosebleed.  Nausea and vomiting.  Severe chest pain.  Jerky  movements you cannot control (seizures).  How is this diagnosed? This condition is diagnosed by measuring your blood pressure while you are seated, with your arm resting on a surface. The cuff of the blood pressure monitor will be placed directly against the skin of your upper arm at the level of your heart. It should be measured at least twice using the same arm. Certain conditions can cause a difference in blood pressure between your right and left arms. Certain factors can cause blood pressure readings to be lower or higher than normal (elevated) for a short period of time:  When your blood pressure is higher when you are in a health care provider's office than when you are at home, this is called white coat hypertension. Most people with this condition do not need medicines.  When your blood pressure is higher at home than when you are in a health care provider's office, this is called masked hypertension. Most people with this condition may need medicines to control blood pressure.  If you have a high blood pressure reading during one visit or you have normal blood pressure with other risk factors:  You may be asked to return on a different day to have your blood pressure checked again.  You may be asked to monitor your blood pressure at home for 1 week or longer.  If you are diagnosed with hypertension, you may have other blood or imaging tests to help your health care provider understand your overall risk for other conditions. How is this treated? This condition is treated by making healthy lifestyle changes, such as eating  healthy foods, exercising more, and reducing your alcohol intake. Your health care provider may prescribe medicine if lifestyle changes are not enough to get your blood pressure under control, and if:  Your systolic blood pressure is above 130.  Your diastolic blood pressure is above 80.  Your personal target blood pressure may vary depending on your medical  conditions, your age, and other factors. Follow these instructions at home: Eating and drinking  Eat a diet that is high in fiber and potassium, and low in sodium, added sugar, and fat. An example eating plan is called the DASH (Dietary Approaches to Stop Hypertension) diet. To eat this way: ? Eat plenty of fresh fruits and vegetables. Try to fill half of your plate at each meal with fruits and vegetables. ? Eat whole grains, such as whole wheat pasta, brown rice, or whole grain bread. Fill about one quarter of your plate with whole grains. ? Eat or drink low-fat dairy products, such as skim milk or low-fat yogurt. ? Avoid fatty cuts of meat, processed or cured meats, and poultry with skin. Fill about one quarter of your plate with lean proteins, such as fish, chicken without skin, beans, eggs, and tofu. ? Avoid premade and processed foods. These tend to be higher in sodium, added sugar, and fat.  Reduce your daily sodium intake. Most people with hypertension should eat less than 1,500 mg of sodium a day.  Limit alcohol intake to no more than 1 drink a day for nonpregnant women and 2 drinks a day for men. One drink equals 12 oz of beer, 5 oz of wine, or 1 oz of hard liquor. Lifestyle  Work with your health care provider to maintain a healthy body weight or to lose weight. Ask what an ideal weight is for you.  Get at least 30 minutes of exercise that causes your heart to beat faster (aerobic exercise) most days of the week. Activities may include walking, swimming, or biking.  Include exercise to strengthen your muscles (resistance exercise), such as pilates or lifting weights, as part of your weekly exercise routine. Try to do these types of exercises for 30 minutes at least 3 days a week.  Do not use any products that contain nicotine or tobacco, such as cigarettes and e-cigarettes. If you need help quitting, ask your health care provider.  Monitor your blood pressure at home as told by  your health care provider.  Keep all follow-up visits as told by your health care provider. This is important. Medicines  Take over-the-counter and prescription medicines only as told by your health care provider. Follow directions carefully. Blood pressure medicines must be taken as prescribed.  Do not skip doses of blood pressure medicine. Doing this puts you at risk for problems and can make the medicine less effective.  Ask your health care provider about side effects or reactions to medicines that you should watch for. Contact a health care provider if:  You think you are having a reaction to a medicine you are taking.  You have headaches that keep coming back (recurring).  You feel dizzy.  You have swelling in your ankles.  You have trouble with your vision. Get help right away if:  You develop a severe headache or confusion.  You have unusual weakness or numbness.  You feel faint.  You have severe pain in your chest or abdomen.  You vomit repeatedly.  You have trouble breathing. Summary  Hypertension is when the force of blood pumping through  your arteries is too strong. If this condition is not controlled, it may put you at risk for serious complications.  Your personal target blood pressure may vary depending on your medical conditions, your age, and other factors. For most people, a normal blood pressure is less than 120/80.  Hypertension is treated with lifestyle changes, medicines, or a combination of both. Lifestyle changes include weight loss, eating a healthy, low-sodium diet, exercising more, and limiting alcohol. This information is not intended to replace advice given to you by your health care provider. Make sure you discuss any questions you have with your health care provider. Document Released: 11/01/2005 Document Revised: 09/29/2016 Document Reviewed: 09/29/2016 Elsevier Interactive Patient Education  [employer].

## 2018-03-13 NOTE — Assessment & Plan Note (Signed)
Follows with [facility] and had her Sinemet to QID without side effects and it has helped th tremors

## 2018-03-13 NOTE — Progress Notes (Signed)
Subjective:  I acted as a Education administrator for Dr. Charlett Blake. [place], [place]  Patient ID: Stephanie [name], female    DOB: 1932-08-06, 84 y.o.   MRN: [id number]  No chief complaint on file.   HPI  Patient is in today for 6 month follow up and she is noting significant right knee pain. She has a long history of a Baker's Cyst and arthritis in the knee and the pain and debility continues to wrosen. She has an appt with her ortho office soon to discuss. She continues to follow with neurology at [facility] for her Parkinson;s and they recently increased her Sinemet recently and it has helped her symptoms some. No recent febrile illness or hospitalizations. Denies CP/palp/SOB/HA/congestion/fevers/GI or GU c/o. Taking meds as prescribed  Patient Care Team: Mosie Lukes, MD as PCP - General (Family Medicine) Megan Salon, MD as Consulting Physician (Gynecology) Eldridge Abrahams, MD as Referring Physician (Neurology) Alexis Frock, MD as Consulting Physician (Urology) Katy Fitch, Darlina Guys, MD as Consulting Physician (Ophthalmology) Corene Cornea, DC (Chiropractic Medicine) Jari Pigg, MD as Consulting Physician (Dermatology)   Past Medical History:  Diagnosis Date  . Arthropathy of cervical spine 11/27/2012   Right  Xray at [facility]  On 04/08/2016  AP, lateral, and lateral flexion and extension views of the cervical spine are submitted for evaluation.   No acute fracture identified in the cervical spine.   There is redemonstration of approximately 2 mm of C3 on C4 anterolisthesis in neutral which slightly increases in flexion relative to neutral and extension. Slight C5 on C6 retrolisthesis does not change i  . Benign hypertension without congestive heart failure   . Benign mole    right hcets mole, bleeds when dried with towel  . Bilateral dry eyes   . Bladder cancer ([place])   . Bladder cancer ([place]) 10/17/2016  . Cervical spondylosis   . Cervicalgia   . Depression with anxiety 10/17/2016  . Gross hematuria     . History of kidney stones    1970's  . Lumbar stenosis   . Malignant tumor of urinary bladder ([place]) 07/2016   Pre-stage I  . OA (osteoarthritis)   . Parkinson disease [facility]) dx 2012   neurologist-  dr siddiqui at [facility]    Past Surgical History:  Procedure Laterality Date  . BIOPSY MASS LEFT FIRST METACARPAL   06/23/2006   benign  . CATARACT EXTRACTION W/ INTRAOCULAR LENS  IMPLANT, BILATERAL  1999 and 2003  . CERVICAL FUSION  02/2017   c1-c2 ; reports she has 2 screws 2in long in place done at [facility] ; patient exhibits VERY LIMITED NECK ROM  . CYSTOSCOPY W/ RETROGRADES Bilateral 09/22/2016   Procedure: CYSTOSCOPY WITH RETROGRADE PYELOGRAM;  Surgeon: Alexis Frock, MD;  Location: [facility];  Service: Urology;  Laterality: Bilateral;  . D & C HYSTEROSCOPY W/ RESECTION POLYP  07/11/2000  . DILATION AND CURETTAGE OF UTERUS  1960's  . EXCISION CYST AND DEBRIDEMENT RIGHT WIRST AND REMOVAL [place] BODY  01/09/2009  . LAPAROSCOPY  yrs ago   infertility   . METACARPOPHALANGEAL JOINT ARTHRODESIS Right 05/25/1996  . TONSILLECTOMY AND ADENOIDECTOMY  child  . TOTAL HIP ARTHROPLASTY Left 07/07/2016   Procedure: LEFT TOTAL HIP ARTHROPLASTY ANTERIOR APPROACH;  Surgeon: Gaynelle Arabian, MD;  [facility];  Service: Orthopedics;  Laterality: Left;  . TOTAL HIP ARTHROPLASTY Right 09/28/2017   Procedure: RIGHT TOTAL HIP ARTHROPLASTY ANTERIOR APPROACH;  Surgeon: Gaynelle Arabian, MD;  Location[facility];  Service: Orthopedics;  Laterality: Right;  . TRANSURETHRAL RESECTION OF BLADDER TUMOR N/A 09/22/2016   Procedure: TRANSURETHRAL RESECTION OF BLADDER TUMOR (TURBT);  Surgeon: Alexis Frock, MD;  Location: [facility];  Service: Urology;  Laterality: N/A;    Family History  Problem Relation Age of Onset  . Arthritis Mother   . Hypertension Mother   . Deep vein thrombosis Mother        recurrent, secondary to Bleeding disorder  . Arthritis Father   .  Stroke Father   . Diabetes Sister   . Heart disease Sister   . Obesity Sister   . Arthritis Sister   . Hypertension Sister   . Heart disease Maternal Grandmother   . Heart disease Maternal Grandfather   . Peripheral vascular disease Paternal Grandmother        s/p leg amputation  . Stroke Paternal Grandfather     Social History   Socioeconomic History  . Marital status: Widowed    Spouse name: Not on file  . Number of children: Not on file  . Years of education: Not on file  . Highest education level: Not on file  Occupational History  . Not on file  Social Needs  . Financial resource strain: Not on file  . Food insecurity:    Worry: Not on file    Inability: Not on file  . Transportation needs:    Medical: Not on file    Non-medical: Not on file  Tobacco Use  . Smoking status: Former Smoker    Years: 10.00    Last attempt to quit: 05/04/1964    Years since quitting: 53.8  . Smokeless tobacco: Never Used  Substance and Sexual Activity  . Alcohol use: Yes    Alcohol/week: 6.6 oz    Types: 7 Glasses of wine, 4 Standard drinks or equivalent per week    Comment: social  . Drug use: No  . Sexual activity: Never    Partners: Male  Lifestyle  . Physical activity:    Days per week: Not on file    Minutes per session: Not on file  . Stress: Not on file  Relationships  . Social connections:    Talks on phone: Not on file    Gets together: Not on file    Attends religious service: Not on file    Active member of club or organization: Not on file    Attends meetings of clubs or organizations: Not on file    Relationship status: Not on file  . Intimate partner violence:    Fear of current or ex partner: Not on file    Emotionally abused: Not on file    Physically abused: Not on file    Forced sexual activity: Not on file  Other Topics Concern  . Not on file  Social History Narrative  . Not on file    Outpatient Medications Prior to Visit  Medication Sig  Dispense Refill  . acetaminophen (TYLENOL) 500 MG tablet Take 1,000 mg by mouth 3 (three) times daily.     . ACIDOPHILUS LACTOBACILLUS PO Take by mouth.    . Biotin 5000 MCG SUBL Place under the tongue.    . carbidopa-levodopa (SINEMET IR) 25-100 MG per tablet Take 1 tablet by mouth 3 (three) times daily.     . Cholecalciferol (VITAMIN D-1000 MAX ST) 1000 units tablet Take by mouth.    Marland Kitchen Co-Enzyme Q-10 30 MG CAPS Take by mouth.    . diazepam (  VALIUM) 5 MG tablet Take 1 tablet (5 mg total) by mouth every 12 (twelve) hours as needed for anxiety. (Patient taking differently: Take 5 mg by mouth at bedtime. ) 60 tablet 3  . diclofenac (VOLTAREN) 50 MG EC tablet Take by mouth.    . EPINEPHrine 0.3 mg/0.3 mL IJ SOAJ injection Inject 0.3 mLs (0.3 mg total) into the muscle once. (Patient taking differently: Inject 0.3 mg into the muscle once. ) 2 Device 2  . estradiol (ESTRACE) 0.5 MG tablet TAKE 1/2 TABLET BY MOUTH EVERY DAY IN AM 45 tablet 0  . loratadine (CLARITIN) 10 MG tablet Take 10 mg by mouth daily.    . medroxyPROGESTERone (PROVERA) 2.5 MG tablet TAKE 0.5 TABLETS (1.25 MG TOTAL) BY MOUTH DAILY. 45 tablet 0  . Multiple Vitamin (MULTI-VITAMINS) TABS Take by mouth.    Vladimir Faster Glycol-Propyl Glycol (SYSTANE) 0.4-0.3 % SOLN Apply 1 drop to eye 2 (two) times daily.     Marland Kitchen senna-docusate (SENOKOT-S) 8.6-50 MG tablet Take by mouth.    . sertraline (ZOLOFT) 25 MG tablet TAKE 1 TABLET BY MOUTH EVERY MORNING 90 tablet 0  . hydrochlorothiazide (HYDRODIURIL) 25 MG tablet TAKE 1 AND 1/2 TABLETS BY MOUTH DAILY 45 tablet 1  . methocarbamol (ROBAXIN) 500 MG tablet Take 1 tablet (500 mg total) every 6 (six) hours as needed by mouth for muscle spasms. 80 tablet 0  . oxyCODONE (OXY IR/ROXICODONE) 5 MG immediate release tablet Take 1-2 tablets (5-10 mg total) every 4 (four) hours as needed by mouth for moderate pain or severe pain. (Patient not taking: Reported on 11/16/2017) 60 tablet 0  . rivaroxaban (XARELTO) 10  MG TABS tablet Take 1 tablet (10 mg total) daily with breakfast by mouth. Take Xarelto for two and a half more weeks following discharge from the hospital, then discontinue Xarelto. Once the patient has completed the blood thinner regimen, then take a Baby 81 mg Aspirin daily for three more weeks. (Patient not taking: Reported on 11/16/2017) 20 tablet 0   No facility-administered medications prior to visit.     Allergies  Allergen Reactions  . Bee Venom Swelling    Yellow jackets, white and black face hornets, local swelling only  . Codeine Nausea And Vomiting  . Penicillins Itching and Swelling    Has patient had a PCN reaction causing immediate rash, facial/tongue/throat swelling, SOB or lightheadedness with hypotension: Yes Has patient had a PCN reaction causing severe rash involving mucus membranes or skin necrosis: No Has patient had a PCN reaction that required hospitalization: No Has patient had a PCN reaction occurring within the last 10 years: No If all of the above answers are "NO", then may proceed with Cephalosporin use.   . Lodine [Etodolac] Rash  . Tramadol Itching and Rash    Review of Systems  Constitutional: Negative for fever and malaise/fatigue.  HENT: Negative for congestion.   Eyes: Negative for blurred vision.  Respiratory: Negative for shortness of breath.   Cardiovascular: Negative for chest pain, palpitations and leg swelling.  Gastrointestinal: Negative for abdominal pain, blood in stool and nausea.  Genitourinary: Negative for dysuria and frequency.  Musculoskeletal: Positive for joint pain. Negative for falls.  Skin: Negative for rash.  Neurological: Negative for dizziness, loss of consciousness and headaches.  Endo/Heme/Allergies: Negative for environmental allergies.  Psychiatric/Behavioral: Negative for depression. The patient is not nervous/anxious.        Objective:    Physical Exam  Constitutional: She is oriented to person, place, and time.  She appears well-developed and well-nourished. No distress.  HENT:  Head: Normocephalic and atraumatic.  Nose: Nose normal.  Eyes: Right eye exhibits no discharge. Left eye exhibits no discharge.  Neck: Normal range of motion. Neck supple.  Cardiovascular: Normal rate and regular rhythm.  No murmur heard. Pulmonary/Chest: Effort normal and breath sounds normal.  Abdominal: Soft. Bowel sounds are normal. There is no tenderness.  Musculoskeletal: She exhibits no edema.  Neurological: She is alert and oriented to person, place, and time.  Skin: Skin is warm and dry.  Psychiatric: She has a normal mood and affect.  Nursing note and vitals reviewed.   BP 98/62 (BP Location: Left Arm, Patient Position: Sitting, Cuff Size: Normal)   Pulse 100   Temp (!) 97.4 F (36.3 C) (Oral)   Resp 18   Wt 124 lb 6.4 oz (56.4 kg)   LMP 01/13/2013 Comment: spotting-had benign endometrial biopsy   SpO2 96%   BMI 22.04 kg/m  Wt Readings from Last 3 Encounters:  03/13/18 124 lb 6.4 oz (56.4 kg)  11/16/17 124 lb 1.9 oz (56.3 kg)  09/28/17 125 lb (56.7 kg)   BP Readings from Last 3 Encounters:  03/13/18 98/62  11/16/17 136/66  09/29/17 126/60     Immunization History  Administered Date(s) Administered  . Influenza Whole 08/15/2012  . Influenza, High Dose Seasonal PF 08/19/2015, 08/29/2017  . Influenza-Unspecified 08/15/2014, 08/24/2016  . Pneumococcal Conjugate-13 12/11/2014  . Pneumococcal Polysaccharide-23 11/24/2011  . Td 02/25/2014  . Zoster 08/27/2011    Health Maintenance  Topic Date Due  . MAMMOGRAM  04/06/2018  . INFLUENZA VACCINE  06/15/2018  . TETANUS/TDAP  02/26/2024  . DEXA SCAN  Completed  . PNA vac Low Risk Adult  Completed    Lab Results  Component Value Date   WBC 6.8 03/13/2018   HGB 13.8 03/13/2018   HCT 40.5 03/13/2018   PLT 188.0 03/13/2018   GLUCOSE 92 03/13/2018   CHOL 177 03/13/2018   TRIG 103.0 03/13/2018   HDL 56.60 03/13/2018   LDLDIRECT 120.0  12/11/2014   LDLCALC 100 (H) 03/13/2018   ALT 5 03/13/2018   AST 26 03/13/2018   NA 137 03/13/2018   K 3.9 03/13/2018   CL 98 03/13/2018   CREATININE 0.80 03/13/2018   BUN 25 (H) 03/13/2018   CO2 29 03/13/2018   TSH 1.00 03/13/2018   INR 0.95 09/21/2017    Lab Results  Component Value Date   TSH 1.00 03/13/2018   Lab Results  Component Value Date   WBC 6.8 03/13/2018   HGB 13.8 03/13/2018   HCT 40.5 03/13/2018   MCV 88.0 03/13/2018   PLT 188.0 03/13/2018   Lab Results  Component Value Date   NA 137 03/13/2018   K 3.9 03/13/2018   CO2 29 03/13/2018   GLUCOSE 92 03/13/2018   BUN 25 (H) 03/13/2018   CREATININE 0.80 03/13/2018   BILITOT 0.6 03/13/2018   ALKPHOS 48 03/13/2018   AST 26 03/13/2018   ALT 5 03/13/2018   PROT 6.5 03/13/2018   ALBUMIN 4.5 03/13/2018   CALCIUM 9.5 03/13/2018   ANIONGAP 5 09/29/2017   GFR 72.60 03/13/2018   Lab Results  Component Value Date   CHOL 177 03/13/2018   Lab Results  Component Value Date   HDL 56.60 03/13/2018   Lab Results  Component Value Date   LDLCALC 100 (H) 03/13/2018   Lab Results  Component Value Date   TRIG 103.0 03/13/2018   Lab Results  Component  Value Date   CHOLHDL 3 03/13/2018   No results found for: HGBA1C       Assessment & Plan:   Problem List Items Addressed This Visit    HTN (hypertension)    Well controlled, no changes to meds. Encouraged heart healthy diet such as the DASH diet and exercise as tolerated.       Relevant Orders   CBC (Completed)   Comprehensive metabolic panel (Completed)   TSH (Completed)   Arthritis    Right knee acting up.notes pain and a significan baker's cyst. Has an appointment with her orthopaedist soon to discuss options.       Parkinson's disease ([place])    Follows with [facility] and had her Sinemet to QID without side effects and it has helped th tremors      Hyperlipidemia    Tolerating statin, encouraged heart healthy diet, avoid trans fats, minimize simple  carbs and saturated fats. Increase exercise as tolerated      Relevant Orders   Lipid panel (Completed)   Neck pain    Released by Dr Redmond Pulling of Neurosurgery s/p surgery a year ago. Doing better      Anemia    Increase leafy greens, consider increased lean red meat and using cast iron cookware. Continue to monitor, report any concerns      Hot flashes    Doing well on 1/2 tab of her Estradiol daily. Had to come off for surgery and was overwhelmed by the hot flashes so she chose to go back on and she feels better. She understands the risk and chooses to continue         I have discontinued Kaisy Severino. Batterson's methocarbamol, oxyCODONE, and rivaroxaban. I am also having her maintain her carbidopa-levodopa, EPINEPHrine, acetaminophen, Polyethyl Glycol-Propyl Glycol, loratadine, diazepam, ACIDOPHILUS LACTOBACILLUS PO, Biotin, Cholecalciferol, Co-Enzyme Q-10, diclofenac, MULTI-VITAMINS, senna-docusate, sertraline, medroxyPROGESTERone, and estradiol.  No orders of the defined types were placed in this encounter.   CMA served as Education administrator during this visit. History, Physical and Plan performed by medical provider. Documentation and orders reviewed and attested to.  Penni Homans, MD

## 2018-03-13 NOTE — Assessment & Plan Note (Signed)
Doing well on 1/2 tab of her Estradiol daily. Had to come off for surgery and was overwhelmed by the hot flashes so she chose to go back on and she feels better. She understands the risk and chooses to continue

## 2018-03-14 LAB — COMPREHENSIVE METABOLIC PANEL
ALBUMIN: 4.5 g/dL (ref 3.5–5.2)
ALT: 5 U/L (ref 0–35)
AST: 26 U/L (ref 0–37)
Alkaline Phosphatase: 48 U/L (ref 39–117)
BUN: 25 mg/dL — AB (ref 6–23)
CHLORIDE: 98 meq/L (ref 96–112)
CO2: 29 meq/L (ref 19–32)
CREATININE: 0.8 mg/dL (ref 0.40–1.20)
Calcium: 9.5 mg/dL (ref 8.4–10.5)
GFR: 72.6 mL/min (ref >60.00)
Glucose, Bld: 92 mg/dL (ref 70–99)
POTASSIUM: 3.9 meq/L (ref 3.5–5.1)
SODIUM: 137 meq/L (ref 135–145)
Total Bilirubin: 0.6 mg/dL (ref 0.2–1.2)
Total Protein: 6.5 g/dL (ref 6.0–8.3)

## 2018-03-14 LAB — CBC
HEMATOCRIT: 40.5 % (ref 36.0–46.0)
Hemoglobin: 13.8 g/dL (ref 12.0–15.0)
MCHC: 34 g/dL (ref 30.0–36.0)
MCV: 88 fl (ref 78.0–100.0)
Platelets: 188 10*3/uL (ref 150.0–400.0)
RBC: 4.6 Mil/uL (ref 3.87–5.11)
RDW: 15.3 % (ref 11.5–15.5)
WBC: 6.8 10*3/uL (ref 4.0–10.5)

## 2018-03-14 LAB — LIPID PANEL
CHOL/HDL RATIO: 3
CHOLESTEROL: 177 mg/dL (ref 0–200)
HDL: 56.6 mg/dL (ref >39.00)
LDL CALC: 100 mg/dL — AB (ref 0–99)
NonHDL: 120.13
TRIGLYCERIDES: 103 mg/dL (ref 0.0–149.0)
VLDL: 20.6 mg/dL (ref 0.0–40.0)

## 2018-03-14 LAB — TSH: TSH: 1 u[IU]/mL (ref 0.35–4.50)

## 2018-03-17 ENCOUNTER — Other Ambulatory Visit: Payer: Self-pay | Admitting: Family Medicine

## 2018-03-17 DIAGNOSIS — M1711 Unilateral primary osteoarthritis, right knee: Secondary | ICD-10-CM | POA: Diagnosis not present

## 2018-03-17 DIAGNOSIS — M7121 Synovial cyst of popliteal space [Baker], right knee: Secondary | ICD-10-CM | POA: Diagnosis not present

## 2018-03-20 NOTE — Telephone Encounter (Signed)
I sent one month supply but needs UDS prior to more prescriptions

## 2018-03-20 NOTE — Telephone Encounter (Signed)
Requesting:[employer] Contract:needs one ASU:ORVIF one  Last OV:03/13/18 Next OV:09/11/18 Last Refill:09/05/17   #[id number] Database:   Please advise '

## 2018-03-22 DIAGNOSIS — M461 Sacroiliitis, not elsewhere classified: Secondary | ICD-10-CM | POA: Diagnosis not present

## 2018-03-29 DIAGNOSIS — M533 Sacrococcygeal disorders, not elsewhere classified: Secondary | ICD-10-CM | POA: Diagnosis not present

## 2018-04-07 ENCOUNTER — Other Ambulatory Visit: Payer: Self-pay | Admitting: Family Medicine

## 2018-04-07 DIAGNOSIS — M25561 Pain in right knee: Secondary | ICD-10-CM | POA: Diagnosis not present

## 2018-04-07 DIAGNOSIS — Z1231 Encounter for screening mammogram for malignant neoplasm of breast: Secondary | ICD-10-CM | POA: Diagnosis not present

## 2018-04-14 ENCOUNTER — Encounter: Payer: Self-pay | Admitting: Family Medicine

## 2018-04-24 DIAGNOSIS — R310 Gross hematuria: Secondary | ICD-10-CM | POA: Diagnosis not present

## 2018-04-24 DIAGNOSIS — C672 Malignant neoplasm of lateral wall of bladder: Secondary | ICD-10-CM | POA: Diagnosis not present

## 2018-04-24 DIAGNOSIS — C641 Malignant neoplasm of right kidney, except renal pelvis: Secondary | ICD-10-CM | POA: Diagnosis not present

## 2018-04-25 ENCOUNTER — Encounter: Payer: Self-pay | Admitting: Obstetrics & Gynecology

## 2018-04-30 ENCOUNTER — Other Ambulatory Visit: Payer: Self-pay | Admitting: Family Medicine

## 2018-05-05 DIAGNOSIS — M1711 Unilateral primary osteoarthritis, right knee: Secondary | ICD-10-CM | POA: Diagnosis not present

## 2018-05-05 DIAGNOSIS — M25561 Pain in right knee: Secondary | ICD-10-CM | POA: Diagnosis not present

## 2018-05-16 ENCOUNTER — Other Ambulatory Visit: Payer: Self-pay | Admitting: Family Medicine

## 2018-05-16 ENCOUNTER — Encounter: Payer: Self-pay | Admitting: Obstetrics & Gynecology

## 2018-05-16 ENCOUNTER — Other Ambulatory Visit: Payer: Self-pay

## 2018-05-16 ENCOUNTER — Encounter

## 2018-05-16 ENCOUNTER — Ambulatory Visit (INDEPENDENT_AMBULATORY_CARE_PROVIDER_SITE_OTHER): Payer: Medicare Other | Admitting: Obstetrics & Gynecology

## 2018-05-16 VITALS — BP 142/60 | HR 62 | Resp 12 | Ht 63.0 in | Wt 123.0 lb

## 2018-05-16 DIAGNOSIS — Z01419 Encounter for gynecological examination (general) (routine) without abnormal findings: Secondary | ICD-10-CM

## 2018-05-16 DIAGNOSIS — Z124 Encounter for screening for malignant neoplasm of cervix: Secondary | ICD-10-CM | POA: Diagnosis not present

## 2018-05-16 MED ORDER — ESTRADIOL 0.5 MG PO TABS: ORAL_TABLET | ORAL | 4 refills | Status: DC

## 2018-05-16 MED ORDER — MEDROXYPROGESTERONE ACETATE 2.5 MG PO TABS: 1.2500 mg | ORAL_TABLET | Freq: Every day | ORAL | 4 refills | Status: DC

## 2018-05-16 NOTE — Progress Notes (Signed)
84 y.o. G2P1 WidowedCaucasianF here for annual exam.  Doing well.  Had right hip replacement 11/18.  Denies vaginal bleeding.  She tried to stop her HRT for two months prior to her hip replacement.  Had terrible hot flashes.  States today, "I don't care of these if they kill me".  She is back taking the 1/2 tab of estradiol.  She has been back on HRT for about six months now.    Patient's last menstrual period was 01/13/2013.          Sexually active: No.  The current method of family planning is post menopausal status.    Exercising: Yes.    walking, weights, stretching Smoker:  Former smoker   Health Maintenance: Pap:  02-18-17 negative           10-28-15 negative            08-29-14 negative   History of abnormal Pap:  yes MMG:  04-07-18 BIRADS 2 benign  Colonoscopy:  01-2003 f/u 10 years BMD:   03-08-16 normal  TDaP:  02-25-14  Pneumonia vaccine(s):  11-24-11, 12-11-14  Shingrix:   Completed x 2 Hep C testing: not indicated   Screening Labs: PCP, Hb today: PCP, Urine today: urologist   reports that she quit smoking about 54 years ago. She quit after 10.00 years of use. She has never used smokeless tobacco. She reports that she drinks about 6.6 oz of alcohol per week. She reports that she does not use drugs.  Past Medical History:  Diagnosis Date  . Arthropathy of cervical spine 11/27/2012   Right  Xray at [facility]  On 04/08/2016  AP, lateral, and lateral flexion and extension views of the cervical spine are submitted for evaluation.   No acute fracture identified in the cervical spine.   There is redemonstration of approximately 2 mm of C3 on C4 anterolisthesis in neutral which slightly increases in flexion relative to neutral and extension. Slight C5 on C6 retrolisthesis does not change i  . Benign hypertension without congestive heart failure   . Benign mole    right hcets mole, bleeds when dried with towel  . Bilateral dry eyes   . Bladder cancer ([place])   . Bladder cancer ([place]) 10/17/2016  .  Cervical spondylosis   . Cervicalgia   . Depression with anxiety 10/17/2016  . Gross hematuria   . History of kidney stones    1970's  . Lumbar stenosis   . Malignant tumor of urinary bladder ([place]) 07/2016   Pre-stage I  . OA (osteoarthritis)   . Parkinson disease [facility]) dx 2012   neurologist-  dr siddiqui at [facility]    Past Surgical History:  Procedure Laterality Date  . BIOPSY MASS LEFT FIRST METACARPAL   06/23/2006   benign  . CATARACT EXTRACTION W/ INTRAOCULAR LENS  IMPLANT, BILATERAL  1999 and 2003  . CERVICAL FUSION  02/2017   c1-c2 ; reports she has 2 screws 2in long in place done at [facility] ; patient exhibits VERY LIMITED NECK ROM  . CYSTOSCOPY W/ RETROGRADES Bilateral 09/22/2016   Procedure: CYSTOSCOPY WITH RETROGRADE PYELOGRAM;  Surgeon: Alexis Frock, MD;  Location: [facility];  Service: Urology;  Laterality: Bilateral;  . D & C HYSTEROSCOPY W/ RESECTION POLYP  07/11/2000  . DILATION AND CURETTAGE OF UTERUS  1960's  . EXCISION CYST AND DEBRIDEMENT RIGHT WIRST AND REMOVAL [place] BODY  01/09/2009  . LAPAROSCOPY  yrs ago   infertility   .  METACARPOPHALANGEAL JOINT ARTHRODESIS Right 05/25/1996  . TONSILLECTOMY AND ADENOIDECTOMY  child  . TOTAL HIP ARTHROPLASTY Left 07/07/2016   Procedure: LEFT TOTAL HIP ARTHROPLASTY ANTERIOR APPROACH;  Surgeon: Gaynelle Arabian, MD;  [facility];  Service: Orthopedics;  Laterality: Left;  . TOTAL HIP ARTHROPLASTY Right 09/28/2017   Procedure: RIGHT TOTAL HIP ARTHROPLASTY ANTERIOR APPROACH;  Surgeon: Gaynelle Arabian, MD;  [facility];  Service: Orthopedics;  Laterality: Right;  . TRANSURETHRAL RESECTION OF BLADDER TUMOR N/A 09/22/2016   Procedure: TRANSURETHRAL RESECTION OF BLADDER TUMOR (TURBT);  Surgeon: Alexis Frock, MD;  Location: [facility];  Service: Urology;  Laterality: N/A;    Current Outpatient Medications  Medication Sig Dispense Refill  . acetaminophen (TYLENOL) 500 MG tablet  Take 1,000 mg by mouth 3 (three) times daily.     . ACIDOPHILUS LACTOBACILLUS PO Take by mouth.    . Biotin 5000 MCG SUBL Place under the tongue.    . carbidopa-levodopa (SINEMET IR) 25-100 MG per tablet Take 1 tablet by mouth 4 (four) times daily.     . Cholecalciferol (VITAMIN D-1000 MAX ST) 1000 units tablet Take by mouth.    Marland Kitchen Co-Enzyme Q-10 30 MG CAPS Take by mouth.    . diazepam (VALIUM) 5 MG tablet TAKE 1 TABLET BY MOUTH EVERY 12 HOURS AS NEEDED FOR ANXIETY 60 tablet 0  . diclofenac (VOLTAREN) 50 MG EC tablet Take by mouth.    . EPINEPHrine 0.3 mg/0.3 mL IJ SOAJ injection Inject 0.3 mLs (0.3 mg total) into the muscle once. (Patient taking differently: Inject 0.3 mg into the muscle once. ) 2 Device 2  . estradiol (ESTRACE) 0.5 MG tablet TAKE 1/2 TABLET BY MOUTH EVERY DAY IN AM 45 tablet 0  . fluticasone (CUTIVATE) 0.05 % cream APPLY TO AFFECTED AREA TWICE A DAY  1  . hydrochlorothiazide (HYDRODIURIL) 25 MG tablet TAKE 1 AND 1/2 TABLETS BY MOUTH DAILY 45 tablet 0  . loratadine (CLARITIN) 10 MG tablet Take 10 mg by mouth daily.    . medroxyPROGESTERone (PROVERA) 2.5 MG tablet TAKE 0.5 TABLETS (1.25 MG TOTAL) BY MOUTH DAILY. 45 tablet 0  . Multiple Vitamin (MULTI-VITAMINS) TABS Take by mouth.    Vladimir Faster Glycol-Propyl Glycol (SYSTANE) 0.4-0.3 % SOLN Apply 1 drop to eye 2 (two) times daily.     Marland Kitchen senna-docusate (SENOKOT-S) 8.6-50 MG tablet Take by mouth.    . sertraline (ZOLOFT) 25 MG tablet TAKE 1 TABLET BY MOUTH EVERY MORNING 90 tablet 0   No current facility-administered medications for this visit.     Family History  Problem Relation Age of Onset  . Arthritis Mother   . Hypertension Mother   . Deep vein thrombosis Mother        recurrent, secondary to Bleeding disorder  . Arthritis Father   . Stroke Father   . Diabetes Sister   . Heart disease Sister   . Obesity Sister   . Arthritis Sister   . Hypertension Sister   . Heart disease Maternal Grandmother   . Heart disease  Maternal Grandfather   . Peripheral vascular disease Paternal Grandmother        s/p leg amputation  . Stroke Paternal Grandfather     Review of Systems  Genitourinary:       Night urination    Musculoskeletal: Positive for joint pain.  All other systems reviewed and are negative.   Exam:   BP (!) 142/60 (BP Location: Right Arm, Patient Position: Sitting, Cuff Size: Normal)  Pulse 62   Resp 12   Ht 5\' 3"  (1.6 m)   Wt 123 lb (55.8 kg)   LMP 01/13/2013 Comment: spotting-had benign endometrial biopsy   BMI 21.79 kg/m   Height:   Height: 5\' 3"  (160 cm)  Ht Readings from Last 3 Encounters:  05/16/18 5\' 3"  (1.6 m)  11/16/17 5\' 3"  (1.6 m)  09/28/17 5\' 3"  (1.6 m)    General appearance: alert, cooperative and appears stated age Head: Normocephalic, without obvious abnormality, atraumatic Neck: no adenopathy, supple, symmetrical, trachea midline and thyroid normal to inspection and palpation Lungs: clear to auscultation bilaterally Breasts: normal appearance, no masses or tenderness Heart: regular rate and rhythm Abdomen: soft, non-tender; bowel sounds normal; no masses,  no organomegaly Extremities: extremities normal, atraumatic, no cyanosis or edema Skin: Skin color, texture, turgor normal. No rashes or lesions Lymph nodes: Cervical, supraclavicular, and axillary nodes normal. No abnormal inguinal nodes palpated Neurologic: Grossly normal   Pelvic: External genitalia:  no lesions              Urethra:  normal appearing urethra with no masses, tenderness or lesions              Bartholins and Skenes: normal                 Vagina: normal appearing vagina with normal color and discharge, no lesions              Cervix: no lesions              Pap taken: Yes.   Bimanual Exam:  Uterus:  normal size, contour, position, consistency, mobility, non-tender              Adnexa: normal adnexa and no mass, fullness, tenderness               Rectovaginal: Confirms               Anus:   normal sphincter tone, no lesions  Chaperone was present for exam.  A:  Well Woman with normal exam PMP, on low dosed HRT (does not desire to stop) Hypertension Parkinson's, being followed at [facility] H/O depression Bladder cancer, followed by Dr. Tresa Moore  P:   Mammogram guidelines reviewed pap smear not indicated.  Done 2018. Sees Dr. Randel Pigg.  Blood work in April reviewed. Estradiol 0.5 1/2 tab daily.  Provera 2.5mg  1/2 tab daily.  Rx to pharmacy.  Reviewed risks with pt including DVT, PE, stroke, MI and breast cancer Return annual or prn return annually or prn

## 2018-05-16 NOTE — Telephone Encounter (Signed)
Requesting: VALIUM 5 MG Contract: 01/13/17 UDS: 01/13/17 Last OV: 03/13/18 Next OV: 09/11/18 Last Refill: 03/20/18   Please advise

## 2018-05-18 NOTE — Telephone Encounter (Signed)
OK to refill 30 day supply but needs UDS and contract for any refills. I have sent in

## 2018-05-31 ENCOUNTER — Other Ambulatory Visit: Payer: Self-pay | Admitting: Family Medicine

## 2018-06-12 ENCOUNTER — Other Ambulatory Visit: Payer: Self-pay | Admitting: Family Medicine

## 2018-06-21 DIAGNOSIS — Z880 Allergy status to penicillin: Secondary | ICD-10-CM | POA: Diagnosis not present

## 2018-06-21 DIAGNOSIS — R252 Cramp and spasm: Secondary | ICD-10-CM | POA: Diagnosis not present

## 2018-06-21 DIAGNOSIS — G20 Parkinson's disease: Secondary | ICD-10-CM | POA: Diagnosis not present

## 2018-06-21 DIAGNOSIS — R4189 Other symptoms and signs involving cognitive functions and awareness: Secondary | ICD-10-CM | POA: Diagnosis not present

## 2018-06-21 DIAGNOSIS — Z79899 Other long term (current) drug therapy: Secondary | ICD-10-CM | POA: Diagnosis not present

## 2018-06-21 DIAGNOSIS — Z885 Allergy status to narcotic agent status: Secondary | ICD-10-CM | POA: Diagnosis not present

## 2018-06-28 DIAGNOSIS — M71572 Other bursitis, not elsewhere classified, left ankle and foot: Secondary | ICD-10-CM | POA: Diagnosis not present

## 2018-07-08 ENCOUNTER — Other Ambulatory Visit: Payer: Self-pay | Admitting: Family Medicine

## 2018-07-10 ENCOUNTER — Other Ambulatory Visit: Payer: Self-pay | Admitting: Family Medicine

## 2018-07-12 NOTE — Telephone Encounter (Signed)
Refill request on diazepam (VALIUM) 5 MG tablet.  Last OV: 03/13/18 Last RF: 05/18/18  CSC: signed 01/13/17

## 2018-07-24 DIAGNOSIS — R310 Gross hematuria: Secondary | ICD-10-CM | POA: Diagnosis not present

## 2018-07-24 DIAGNOSIS — C672 Malignant neoplasm of lateral wall of bladder: Secondary | ICD-10-CM | POA: Diagnosis not present

## 2018-07-24 DIAGNOSIS — C641 Malignant neoplasm of right kidney, except renal pelvis: Secondary | ICD-10-CM | POA: Diagnosis not present

## 2018-07-31 DIAGNOSIS — M7121 Synovial cyst of popliteal space [Baker], right knee: Secondary | ICD-10-CM | POA: Diagnosis not present

## 2018-08-03 ENCOUNTER — Other Ambulatory Visit: Payer: Self-pay | Admitting: Family Medicine

## 2018-08-15 DIAGNOSIS — Z23 Encounter for immunization: Secondary | ICD-10-CM | POA: Diagnosis not present

## 2018-08-27 ENCOUNTER — Other Ambulatory Visit: Payer: Self-pay | Admitting: Family Medicine

## 2018-09-11 ENCOUNTER — Ambulatory Visit: Payer: Medicare Other | Admitting: Family Medicine

## 2018-09-14 DIAGNOSIS — M25561 Pain in right knee: Secondary | ICD-10-CM | POA: Diagnosis not present

## 2018-09-14 DIAGNOSIS — M1711 Unilateral primary osteoarthritis, right knee: Secondary | ICD-10-CM | POA: Diagnosis not present

## 2018-09-15 ENCOUNTER — Ambulatory Visit (INDEPENDENT_AMBULATORY_CARE_PROVIDER_SITE_OTHER): Payer: Medicare Other | Admitting: Family Medicine

## 2018-09-15 VITALS — BP 108/64 | HR 76 | Temp 97.6°F | Resp 18 | Wt 125.0 lb

## 2018-09-15 DIAGNOSIS — M25561 Pain in right knee: Secondary | ICD-10-CM

## 2018-09-15 DIAGNOSIS — F418 Other specified anxiety disorders: Secondary | ICD-10-CM | POA: Diagnosis not present

## 2018-09-15 DIAGNOSIS — C679 Malignant neoplasm of bladder, unspecified: Secondary | ICD-10-CM

## 2018-09-15 DIAGNOSIS — G20 Parkinson's disease: Secondary | ICD-10-CM

## 2018-09-15 DIAGNOSIS — G8929 Other chronic pain: Secondary | ICD-10-CM | POA: Diagnosis not present

## 2018-09-15 DIAGNOSIS — Z981 Arthrodesis status: Secondary | ICD-10-CM

## 2018-09-15 DIAGNOSIS — I10 Essential (primary) hypertension: Secondary | ICD-10-CM

## 2018-09-15 DIAGNOSIS — Z79899 Other long term (current) drug therapy: Secondary | ICD-10-CM | POA: Diagnosis not present

## 2018-09-15 DIAGNOSIS — E7800 Pure hypercholesterolemia, unspecified: Secondary | ICD-10-CM | POA: Diagnosis not present

## 2018-09-15 MED ORDER — DIAZEPAM 5 MG PO TABS: 5.0000 mg | ORAL_TABLET | Freq: Two times a day (BID) | ORAL | 0 refills | Status: DC | PRN

## 2018-09-15 MED ORDER — SERTRALINE HCL 25 MG PO TABS: 25.0000 mg | ORAL_TABLET | Freq: Every morning | ORAL | 1 refills | Status: DC

## 2018-09-15 MED ORDER — EPINEPHRINE 0.3 MG/0.3ML IJ SOAJ: 0.3000 mg | Freq: Once | INTRAMUSCULAR | 1 refills | Status: DC

## 2018-09-15 NOTE — Assessment & Plan Note (Signed)
Well controlled, no changes to meds. Encouraged heart healthy diet such as the DASH diet and exercise as tolerated.  °

## 2018-09-15 NOTE — Assessment & Plan Note (Signed)
Encouraged heart healthy diet, increase exercise, avoid trans fats, consider a krill oil cap daily 

## 2018-09-15 NOTE — Assessment & Plan Note (Signed)
Dr Tresa Moore has not found any recurrence but continues to follow closely

## 2018-09-15 NOTE — Patient Instructions (Signed)
Hyland's Leg Cramp medicine as needed   Hypertension Hypertension is another name for high blood pressure. High blood pressure forces your heart to work harder to pump blood. This can cause problems over time. There are two numbers in a blood pressure reading. There is a top number (systolic) over a bottom number (diastolic). It is best to have a blood pressure below 120/80. Healthy choices can help lower your blood pressure. You may need medicine to help lower your blood pressure if:  Your blood pressure cannot be lowered with healthy choices.  Your blood pressure is higher than 130/80.  Follow these instructions at home: Eating and drinking  If directed, follow the DASH eating plan. This diet includes: ? Filling half of your plate at each meal with fruits and vegetables. ? Filling one quarter of your plate at each meal with whole grains. Whole grains include whole wheat pasta, brown rice, and whole grain bread. ? Eating or drinking low-fat dairy products, such as skim milk or low-fat yogurt. ? Filling one quarter of your plate at each meal with low-fat (lean) proteins. Low-fat proteins include fish, skinless chicken, eggs, beans, and tofu. ? Avoiding fatty meat, cured and processed meat, or chicken with skin. ? Avoiding premade or processed food.  Eat less than 1,500 mg of salt (sodium) a day.  Limit alcohol use to no more than 1 drink a day for nonpregnant women and 2 drinks a day for men. One drink equals 12 oz of beer, 5 oz of wine, or 1 oz of hard liquor. Lifestyle  Work with your doctor to stay at a healthy weight or to lose weight. Ask your doctor what the best weight is for you.  Get at least 30 minutes of exercise that causes your heart to beat faster (aerobic exercise) most days of the week. This may include walking, swimming, or biking.  Get at least 30 minutes of exercise that strengthens your muscles (resistance exercise) at least 3 days a week. This may include lifting  weights or pilates.  Do not use any products that contain nicotine or tobacco. This includes cigarettes and e-cigarettes. If you need help quitting, ask your doctor.  Check your blood pressure at home as told by your doctor.  Keep all follow-up visits as told by your doctor. This is important. Medicines  Take over-the-counter and prescription medicines only as told by your doctor. Follow directions carefully.  Do not skip doses of blood pressure medicine. The medicine does not work as well if you skip doses. Skipping doses also puts you at risk for problems.  Ask your doctor about side effects or reactions to medicines that you should watch for. Contact a doctor if:  You think you are having a reaction to the medicine you are taking.  You have headaches that keep coming back (recurring).  You feel dizzy.  You have swelling in your ankles.  You have trouble with your vision. Get help right away if:  You get a very bad headache.  You start to feel confused.  You feel weak or numb.  You feel faint.  You get very bad pain in your: ? Chest. ? Belly (abdomen).  You throw up (vomit) more than once.  You have trouble breathing. Summary  Hypertension is another name for high blood pressure.  Making healthy choices can help lower blood pressure. If your blood pressure cannot be controlled with healthy choices, you may need to take medicine. This information is not intended to replace  advice given to you by your health care provider. Make sure you discuss any questions you have with your health care provider. Document Released: 04/19/2008 Document Revised: 09/29/2016 Document Reviewed: 09/29/2016 Elsevier Interactive Patient Education  [employer].

## 2018-09-15 NOTE — Progress Notes (Signed)
Subjective:    Patient ID: Stephanie [name], female    DOB: October 12, 1932, 84 y.o.   MRN: [id number]  No chief complaint on file.   HPI Patient is in today for follow-up.  She continues to struggle with right knee pain and pain really down her right leg.  She is following with Dr. Posey Pronto of orthopedics.  Due to her swelling, large Baker's cyst and pain which radiates all the way down to her ankle at times they have scheduled her for an MRI of her right knee on November 11.  She has contacted her neurosurgeon at [facility] Dr. Redmond Pulling to confirm that her cervical hardware will not be an issue for this procedure.  She denies any recent febrile illness or hospitalizations.  No polyuria or polydipsia.  Continues to try and stay active despite her pain.  Maintains a heart healthy diet. Denies CP/palp/SOB/HA/congestion/fevers/GI or GU c/o. Taking meds as prescribed  Past Medical History:  Diagnosis Date  . Arthropathy of cervical spine 11/27/2012   Right  Xray at [facility]  On 04/08/2016  AP, lateral, and lateral flexion and extension views of the cervical spine are submitted for evaluation.   No acute fracture identified in the cervical spine.   There is redemonstration of approximately 2 mm of C3 on C4 anterolisthesis in neutral which slightly increases in flexion relative to neutral and extension. Slight C5 on C6 retrolisthesis does not change i  . Benign hypertension without congestive heart failure   . Benign mole    right hcets mole, bleeds when dried with towel  . Bilateral dry eyes   . Bladder cancer ([place])   . Bladder cancer ([place]) 10/17/2016  . Cervical spondylosis   . Cervicalgia   . Depression with anxiety 10/17/2016  . Gross hematuria   . History of kidney stones    1970's  . Lumbar stenosis   . Malignant tumor of urinary bladder ([place]) 07/2016   Pre-stage I  . OA (osteoarthritis)   . Parkinson disease [facility]: [facility]onda W) dx 2012   neurologist-  dr siddiqui at [facility]    Past Surgical  History:  Procedure Laterality Date  . BIOPSY MASS LEFT FIRST METACARPAL   06/23/2006   benign  . CATARACT EXTRACTION W/ INTRAOCULAR LENS  IMPLANT, BILATERAL  1999 and 2003  . CERVICAL FUSION  02/2017   c1-c2 ; reports she has 2 screws 2in long in place done at [facility] ; patient exhibits VERY LIMITED NECK ROM  . CYSTOSCOPY W/ RETROGRADES Bilateral 09/22/2016   Procedure: CYSTOSCOPY WITH RETROGRADE PYELOGRAM;  Surgeon: Alexis Frock, MD;  Location: [facility];  Service: Urology;  Laterality: Bilateral;  . D & C HYSTEROSCOPY W/ RESECTION POLYP  07/11/2000  . DILATION AND CURETTAGE OF UTERUS  1960's  . EXCISION CYST AND DEBRIDEMENT RIGHT WIRST AND REMOVAL [place] BODY  01/09/2009  . LAPAROSCOPY  yrs ago   infertility   . METACARPOPHALANGEAL JOINT ARTHRODESIS Right 05/25/1996  . TONSILLECTOMY AND ADENOIDECTOMY  child  . TOTAL HIP ARTHROPLASTY Left 07/07/2016   Procedure: LEFT TOTAL HIP ARTHROPLASTY ANTERIOR APPROACH;  Surgeon: Gaynelle Arabian, MD;  Location: [facility];  Service: Orthopedics;  Laterality: Left;  . TOTAL HIP ARTHROPLASTY Right 09/28/2017   Procedure: RIGHT TOTAL HIP ARTHROPLASTY ANTERIOR APPROACH;  Surgeon: Gaynelle Arabian, MD;  Location: [facility];  Service: Orthopedics;  Laterality: Right;  . TRANSURETHRAL RESECTION OF BLADDER TUMOR N/A 09/22/2016   Procedure: TRANSURETHRAL RESECTION OF BLADDER TUMOR (TURBT);  Surgeon: Alexis Frock,  MD;  Location: [facility];  Service: Urology;  Laterality: N/A;    Family History  Problem Relation Age of Onset  . Arthritis Mother   . Hypertension Mother   . Deep vein thrombosis Mother        recurrent, secondary to Bleeding disorder  . Arthritis Father   . Stroke Father   . Diabetes Sister   . Heart disease Sister   . Obesity Sister   . Arthritis Sister   . Hypertension Sister   . Heart disease Maternal Grandmother   . Heart disease Maternal Grandfather   . Peripheral vascular disease Paternal  Grandmother        s/p leg amputation  . Stroke Paternal Grandfather     Social History   Socioeconomic History  . Marital status: Widowed    Spouse name: Not on file  . Number of children: Not on file  . Years of education: Not on file  . Highest education level: Not on file  Occupational History  . Not on file  Social Needs  . Financial resource strain: Not on file  . Food insecurity:    Worry: Not on file    Inability: Not on file  . Transportation needs:    Medical: Not on file    Non-medical: Not on file  Tobacco Use  . Smoking status: Former Smoker    Years: 10.00    Last attempt to quit: 05/04/1964    Years since quitting: 54.4  . Smokeless tobacco: Never Used  Substance and Sexual Activity  . Alcohol use: Yes    Alcohol/week: 11.0 standard drinks    Types: 7 Glasses of wine, 4 Standard drinks or equivalent per week    Comment: social  . Drug use: No  . Sexual activity: Never    Partners: Male  Lifestyle  . Physical activity:    Days per week: Not on file    Minutes per session: Not on file  . Stress: Not on file  Relationships  . Social connections:    Talks on phone: Not on file    Gets together: Not on file    Attends religious service: Not on file    Active member of club or organization: Not on file    Attends meetings of clubs or organizations: Not on file    Relationship status: Not on file  . Intimate partner violence:    Fear of current or ex partner: Not on file    Emotionally abused: Not on file    Physically abused: Not on file    Forced sexual activity: Not on file  Other Topics Concern  . Not on file  Social History Narrative  . Not on file    Outpatient Medications Prior to Visit  Medication Sig Dispense Refill  . acetaminophen (TYLENOL) 500 MG tablet Take 1,000 mg by mouth 3 (three) times daily.     . ACIDOPHILUS LACTOBACILLUS PO Take by mouth.    . Biotin 5000 MCG SUBL Place under the tongue.    . carbidopa-levodopa (SINEMET IR)  25-100 MG per tablet Take 1 tablet by mouth 4 (four) times daily.     . Carbidopa-Levodopa ER (SINEMET CR) 25-100 MG tablet controlled release     . Cholecalciferol (VITAMIN D-1000 MAX ST) 1000 units tablet Take by mouth.    Marland Kitchen Co-Enzyme Q-10 30 MG CAPS Take by mouth.    . diclofenac (VOLTAREN) 50 MG EC tablet Take by mouth.    Marland Kitchen  estradiol (ESTRACE) 0.5 MG tablet TAKE 1/2 TABLET BY MOUTH EVERY DAY IN AM 45 tablet 4  . fluticasone (CUTIVATE) 0.05 % cream APPLY TO AFFECTED AREA TWICE A DAY  1  . hydrochlorothiazide (HYDRODIURIL) 25 MG tablet TAKE 1 AND 1/2 TABLETS BY MOUTH DAILY 45 tablet 0  . loratadine (CLARITIN) 10 MG tablet Take 10 mg by mouth daily.    . medroxyPROGESTERone (PROVERA) 2.5 MG tablet Take 0.5 tablets (1.25 mg total) by mouth daily. 45 tablet 4  . Multiple Vitamin (MULTI-VITAMINS) TABS Take by mouth.    Vladimir Faster Glycol-Propyl Glycol (SYSTANE) 0.4-0.3 % SOLN Apply 1 drop to eye 2 (two) times daily.     Marland Kitchen senna-docusate (SENOKOT-S) 8.6-50 MG tablet Take by mouth.    . diazepam (VALIUM) 5 MG tablet TAKE 1 TABLET BY MOUTH EVERY 12 HOURS AS NEEDED FOR ANXIETY 60 tablet 0  . EPINEPHrine 0.3 mg/0.3 mL IJ SOAJ injection Inject 0.3 mLs (0.3 mg total) into the muscle once. (Patient taking differently: Inject 0.3 mg into the muscle once. ) 2 Device 2  . sertraline (ZOLOFT) 25 MG tablet TAKE 1 TABLET BY MOUTH EVERY MORNING 90 tablet 0   No facility-administered medications prior to visit.     Allergies  Allergen Reactions  . Bee Venom Swelling    Yellow jackets, white and black face hornets, local swelling only  . Codeine Nausea And Vomiting  . Hornet Venom   . Penicillins Itching and Swelling    Has patient had a PCN reaction causing immediate rash, facial/tongue/throat swelling, SOB or lightheadedness with hypotension: Yes Has patient had a PCN reaction causing severe rash involving mucus membranes or skin necrosis: No Has patient had a PCN reaction that required hospitalization:  No Has patient had a PCN reaction occurring within the last 10 years: No If all of the above answers are "NO", then may proceed with Cephalosporin use.   . Lodine [Etodolac] Rash  . Tramadol Itching and Rash    Review of Systems  Constitutional: Negative for fever and malaise/fatigue.  HENT: Negative for congestion.   Eyes: Negative for blurred vision.  Respiratory: Negative for shortness of breath.   Cardiovascular: Negative for chest pain, palpitations and leg swelling.  Gastrointestinal: Negative for abdominal pain, blood in stool and nausea.  Genitourinary: Negative for dysuria and frequency.  Musculoskeletal: Positive for joint pain. Negative for falls.  Skin: Negative for rash.  Neurological: Positive for tremors. Negative for dizziness, loss of consciousness and headaches.  Endo/Heme/Allergies: Negative for environmental allergies.  Psychiatric/Behavioral: Negative for depression. The patient is nervous/anxious.        Objective:    Physical Exam  Constitutional: She is oriented to person, place, and time. She appears well-developed and well-nourished. No distress.  HENT:  Head: Normocephalic and atraumatic.  Nose: Nose normal.  Eyes: Right eye exhibits no discharge. Left eye exhibits no discharge.  Neck: Normal range of motion. Neck supple.  Cardiovascular: Normal rate and regular rhythm.  No murmur heard. Pulmonary/Chest: Effort normal and breath sounds normal.  Abdominal: Soft. Bowel sounds are normal. There is no tenderness.  Musculoskeletal: She exhibits no edema.  Neurological: She is alert and oriented to person, place, and time.  Skin: Skin is warm and dry.  Psychiatric: She has a normal mood and affect.  Nursing note and vitals reviewed.   BP 108/64 (BP Location: Left Arm, Patient Position: Sitting, Cuff Size: Normal)   Pulse 76   Temp 97.6 F (36.4 C) (Oral)   Resp  18   Wt 125 lb (56.7 kg)   LMP 01/13/2013 Comment: spotting-had benign endometrial  biopsy   SpO2 97%   BMI 22.14 kg/m  Wt Readings from Last 3 Encounters:  09/15/18 125 lb (56.7 kg)  05/16/18 123 lb (55.8 kg)  03/13/18 124 lb 6.4 oz (56.4 kg)     Lab Results  Component Value Date   WBC 6.8 03/13/2018   HGB 13.8 03/13/2018   HCT 40.5 03/13/2018   PLT 188.0 03/13/2018   GLUCOSE 92 03/13/2018   CHOL 177 03/13/2018   TRIG 103.0 03/13/2018   HDL 56.60 03/13/2018   LDLDIRECT 120.0 12/11/2014   LDLCALC 100 (H) 03/13/2018   ALT 5 03/13/2018   AST 26 03/13/2018   NA 137 03/13/2018   K 3.9 03/13/2018   CL 98 03/13/2018   CREATININE 0.80 03/13/2018   BUN 25 (H) 03/13/2018   CO2 29 03/13/2018   TSH 1.00 03/13/2018   INR 0.95 09/21/2017    Lab Results  Component Value Date   TSH 1.00 03/13/2018   Lab Results  Component Value Date   WBC 6.8 03/13/2018   HGB 13.8 03/13/2018   HCT 40.5 03/13/2018   MCV 88.0 03/13/2018   PLT 188.0 03/13/2018   Lab Results  Component Value Date   NA 137 03/13/2018   K 3.9 03/13/2018   CO2 29 03/13/2018   GLUCOSE 92 03/13/2018   BUN 25 (H) 03/13/2018   CREATININE 0.80 03/13/2018   BILITOT 0.6 03/13/2018   ALKPHOS 48 03/13/2018   AST 26 03/13/2018   ALT 5 03/13/2018   PROT 6.5 03/13/2018   ALBUMIN 4.5 03/13/2018   CALCIUM 9.5 03/13/2018   ANIONGAP 5 09/29/2017   GFR 72.60 03/13/2018   Lab Results  Component Value Date   CHOL 177 03/13/2018   Lab Results  Component Value Date   HDL 56.60 03/13/2018   Lab Results  Component Value Date   LDLCALC 100 (H) 03/13/2018   Lab Results  Component Value Date   TRIG 103.0 03/13/2018   Lab Results  Component Value Date   CHOLHDL 3 03/13/2018   No results found for: HGBA1C     Assessment & Plan:   Problem List Items Addressed This Visit    HTN (hypertension)    Well controlled, no changes to meds. Encouraged heart healthy diet such as the DASH diet and exercise as tolerated.       Right knee pain    Arthritis and a large Baker's Cyst. She follows with Dr  Maureen Ralphs and they are working on how to best manage her pain she has an MRI ordered on 09/25/18      Parkinson's disease ([place])    Follows with neurology at [facility] she was having a lot of leg cramps. Til they changed her fourth dose of Levo/Carbodopa to ER. Did have an episode last night again try Hyland's Leg cramp.       Relevant Medications   Carbidopa-Levodopa ER (SINEMET CR) 25-100 MG tablet controlled release   Hyperlipidemia    Encouraged heart healthy diet, increase exercise, avoid trans fats, consider a krill oil cap daily      Depression with anxiety    Doing well on currents. UDS updated. Contract UTD. No chagnes today      Relevant Medications   sertraline (ZOLOFT) 25 MG tablet   diazepam (VALIUM) 5 MG tablet   Bladder cancer ([facility])    Dr Tresa Moore has not found any recurrence but continues to  follow closely      Relevant Medications   diazepam (VALIUM) 5 MG tablet   S/P cervical spinal fusion    She has contacted Dr Redmond Pulling at [facility] to discuss her cervical hardware in regards to her upcoming MRI of her right knee        Other Visit Diagnoses    High risk medication use    -  Primary   Relevant Orders   Pain Mgmt, Profile 8 w/Conf, U      I have changed Armanda Magic. Wolz's sertraline and diazepam. I am also having her maintain her carbidopa-levodopa, acetaminophen, Polyethyl Glycol-Propyl Glycol, loratadine, ACIDOPHILUS LACTOBACILLUS PO, Biotin, Cholecalciferol, Co-Enzyme Q-10, diclofenac, MULTI-VITAMINS, senna-docusate, fluticasone, estradiol, medroxyPROGESTERone, hydrochlorothiazide, and Carbidopa-Levodopa ER.  Meds ordered this encounter  Medications  . EPINEPHrine 0.3 mg/0.3 mL IJ SOAJ injection    Sig: Inject 0.3 mLs (0.3 mg total) into the muscle once for 1 dose.    Dispense:  1 Device    Refill:  1  . sertraline (ZOLOFT) 25 MG tablet    Sig: Take 1 tablet (25 mg total) by mouth every morning.    Dispense:  90 tablet    Refill:  1  . diazepam (VALIUM) 5 MG tablet      Sig: Take 1 tablet (5 mg total) by mouth every 12 (twelve) hours as needed. for anxiety    Dispense:  60 tablet    Refill:  0    Not to exceed 5 additional fills before 11/14/2018     Penni Homans, MD

## 2018-09-15 NOTE — Assessment & Plan Note (Signed)
Follows with neurology at [facility] she was having a lot of leg cramps. Til they changed her fourth dose of Levo/Carbodopa to ER. Did have an episode last night again try Hyland's Leg cramp.

## 2018-09-17 LAB — PAIN MGMT, PROFILE 8 W/CONF, U
6 ACETYLMORPHINE: NEGATIVE ng/mL (ref <10)
ALPHAHYDROXYTRIAZOLAM: NEGATIVE ng/mL (ref <50)
Alcohol Metabolites: POSITIVE ng/mL — AB (ref <500)
Alphahydroxyalprazolam: NEGATIVE ng/mL (ref <25)
Alphahydroxymidazolam: NEGATIVE ng/mL (ref <50)
Aminoclonazepam: NEGATIVE ng/mL (ref <25)
Amphetamines: NEGATIVE ng/mL (ref <500)
BENZODIAZEPINES: POSITIVE ng/mL — AB (ref <100)
Buprenorphine, Urine: NEGATIVE ng/mL (ref <5)
Cocaine Metabolite: NEGATIVE ng/mL (ref <150)
Creatinine: 49.6 mg/dL
Ethyl Glucuronide (ETG): 2843 ng/mL — ABNORMAL HIGH (ref <500)
Ethyl Sulfate (ETS): 859 ng/mL — ABNORMAL HIGH (ref <100)
Hydroxyethylflurazepam: NEGATIVE ng/mL (ref <50)
Lorazepam: NEGATIVE ng/mL (ref <50)
MDMA: NEGATIVE ng/mL (ref <500)
Marijuana Metabolite: NEGATIVE ng/mL (ref <20)
NORDIAZEPAM: 249 ng/mL — AB (ref <50)
OPIATES: NEGATIVE ng/mL (ref <100)
OXAZEPAM: 652 ng/mL — AB (ref <50)
Oxidant: NEGATIVE ug/mL (ref <200)
Oxycodone: NEGATIVE ng/mL (ref <100)
Temazepam: 594 ng/mL — ABNORMAL HIGH (ref <50)
pH: 6.93 (ref 4.5–9.0)

## 2018-09-17 NOTE — Assessment & Plan Note (Signed)
Arthritis and a large Baker's Cyst. She follows with Dr Maureen Ralphs and they are working on how to best manage her pain she has an MRI ordered on 09/25/18

## 2018-09-17 NOTE — Assessment & Plan Note (Signed)
She has contacted Dr Redmond Pulling at [facility] to discuss her cervical hardware in regards to her upcoming MRI of her right knee

## 2018-09-17 NOTE — Assessment & Plan Note (Signed)
Doing well on currents. UDS updated. Contract UTD. No chagnes today

## 2018-09-20 ENCOUNTER — Other Ambulatory Visit: Payer: Self-pay | Admitting: Family Medicine

## 2018-09-25 DIAGNOSIS — M7121 Synovial cyst of popliteal space [Baker], right knee: Secondary | ICD-10-CM | POA: Diagnosis not present

## 2018-09-28 DIAGNOSIS — M25561 Pain in right knee: Secondary | ICD-10-CM | POA: Diagnosis not present

## 2018-09-28 DIAGNOSIS — M7121 Synovial cyst of popliteal space [Baker], right knee: Secondary | ICD-10-CM | POA: Diagnosis not present

## 2018-09-28 DIAGNOSIS — M1711 Unilateral primary osteoarthritis, right knee: Secondary | ICD-10-CM | POA: Diagnosis not present

## 2018-10-03 ENCOUNTER — Other Ambulatory Visit: Payer: Self-pay

## 2018-10-16 DIAGNOSIS — C641 Malignant neoplasm of right kidney, except renal pelvis: Secondary | ICD-10-CM | POA: Diagnosis not present

## 2018-10-18 DIAGNOSIS — C641 Malignant neoplasm of right kidney, except renal pelvis: Secondary | ICD-10-CM | POA: Diagnosis not present

## 2018-10-18 DIAGNOSIS — N2889 Other specified disorders of kidney and ureter: Secondary | ICD-10-CM | POA: Diagnosis not present

## 2018-10-18 DIAGNOSIS — Z8551 Personal history of malignant neoplasm of bladder: Secondary | ICD-10-CM | POA: Diagnosis not present

## 2018-10-23 DIAGNOSIS — C672 Malignant neoplasm of lateral wall of bladder: Secondary | ICD-10-CM | POA: Diagnosis not present

## 2018-10-23 DIAGNOSIS — C641 Malignant neoplasm of right kidney, except renal pelvis: Secondary | ICD-10-CM | POA: Diagnosis not present

## 2018-10-29 IMAGING — MR MR CERVICAL SPINE W/O CM
4 of 5 series · 30 of 48 positions shown · non-contrast
Comparison: None.

CLINICAL DATA: Right neck pain radiating into the right head and
ear. Symptoms for 1 year. History of bladder cancer. No previous
relevant surgery.

EXAM:
MRI CERVICAL SPINE WITHOUT CONTRAST
TECHNIQUE: Multiplanar, multisequence MR imaging of the cervical spine was
performed. No intravenous contrast was administered.

[Series 3: (id) tse sag · sagittal · 3.0mm · 0.41mm/px · 6 of 13 slices shown]
[im 1/13]
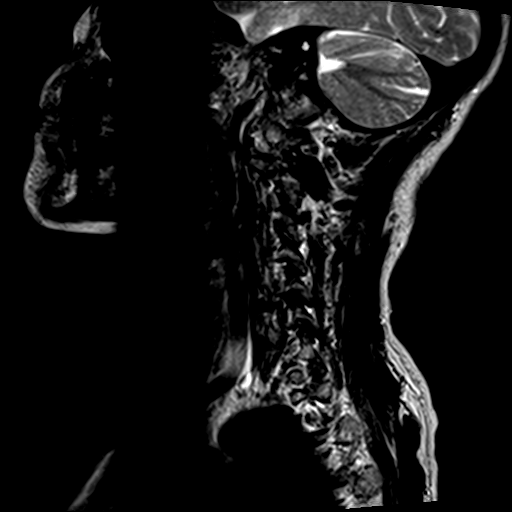
[im 3/13]
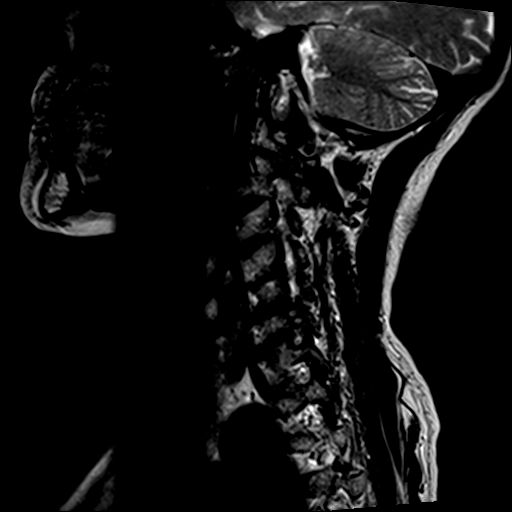
[im 5/13]
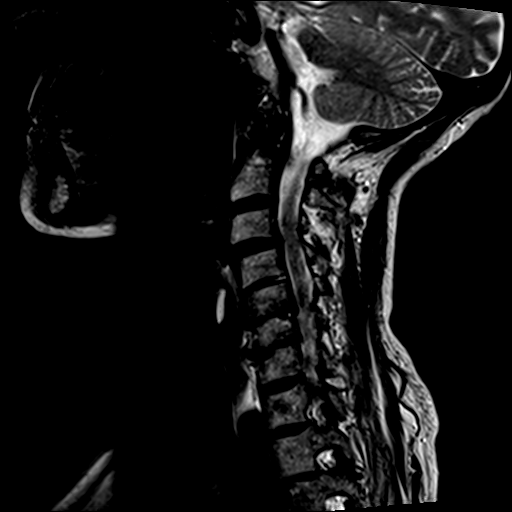
[im 8/13]
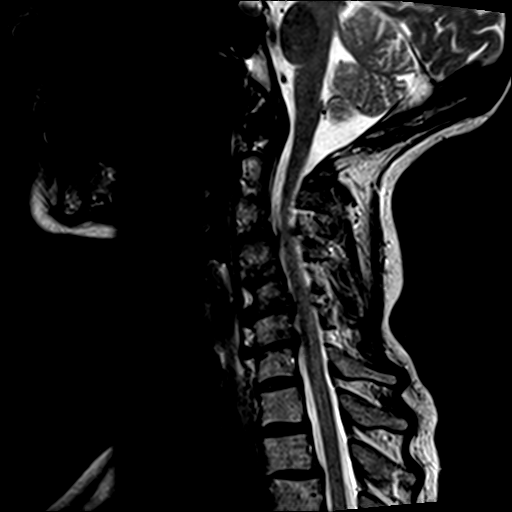
[im 10/13]
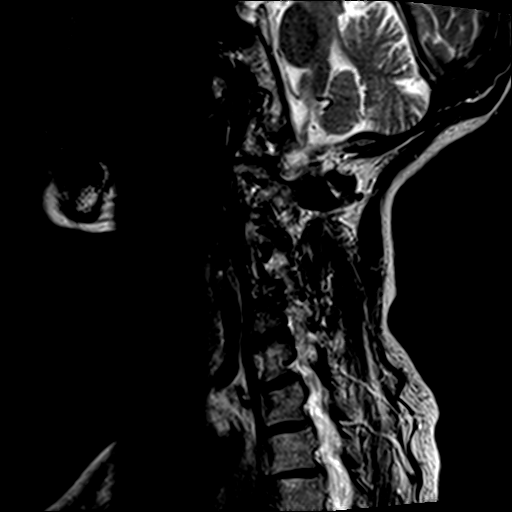
[im 13/13]
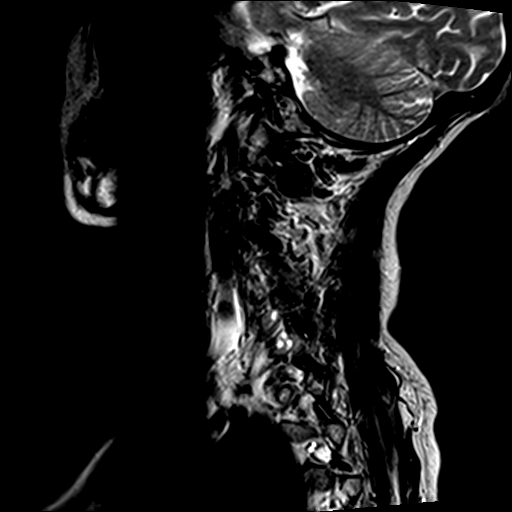

[Series 5: STIR · sagittal · 3.0mm · 0.82mm/px · 6 of 13 slices shown]
[im 1/13]
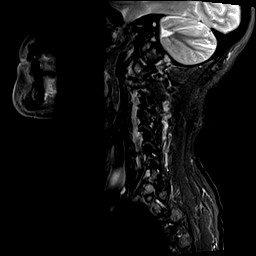
[im 3/13]
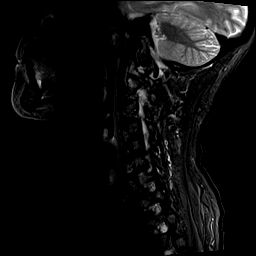
[im 5/13]
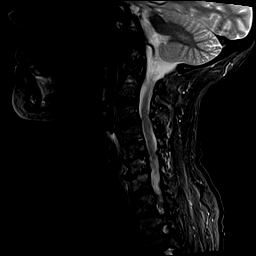
[im 8/13]
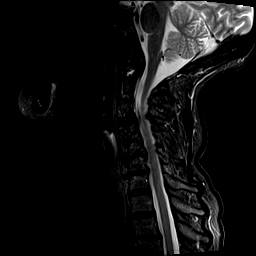
[im 10/13]
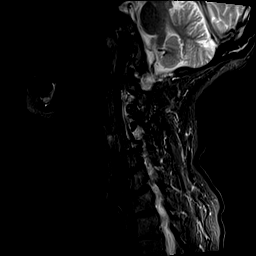
[im 13/13]
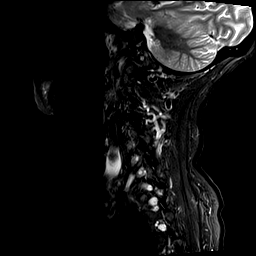

[Series 6: T2 · axial · 3.0mm · 0.39mm/px · z∈[-51,+43]mm · 9 of 31 slices shown (1 of 2)]
[im 1/31]
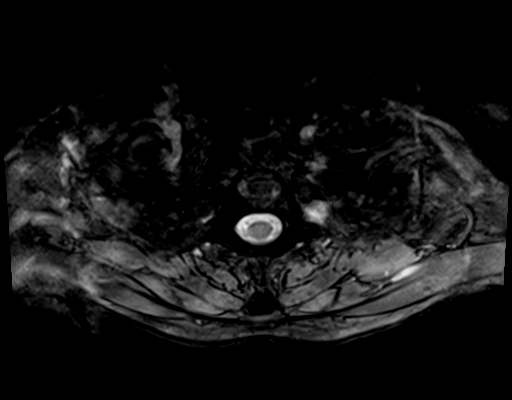
[im 5/31]
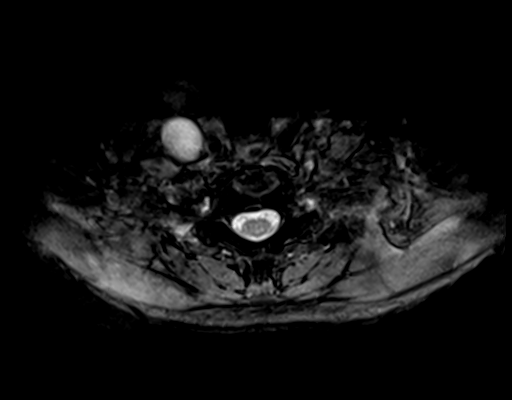
[im 9/31]
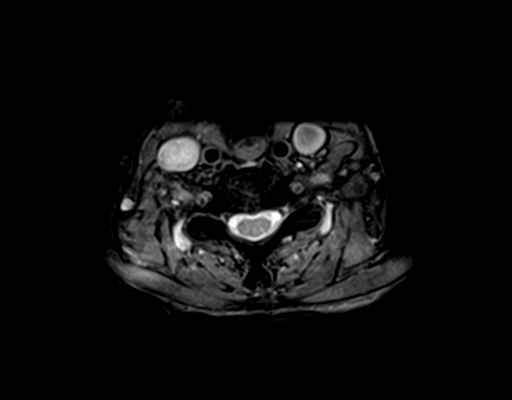
[im 13/31]
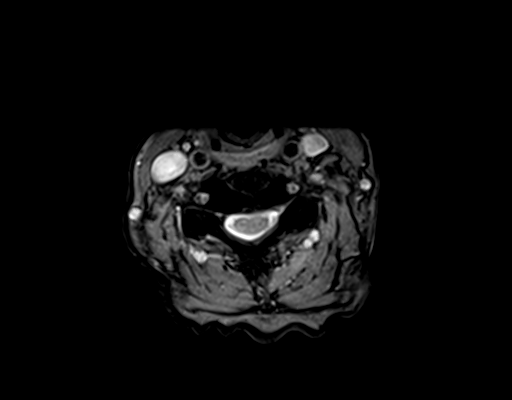
[im 16/31]
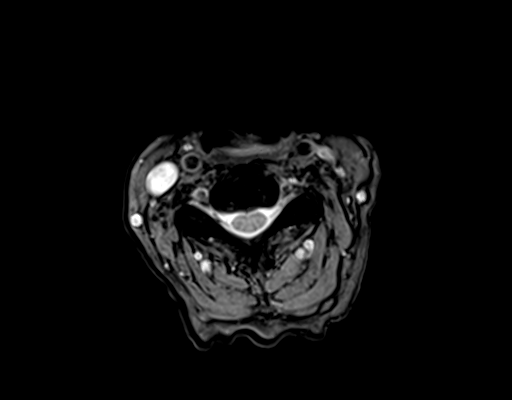
[im 18/31]
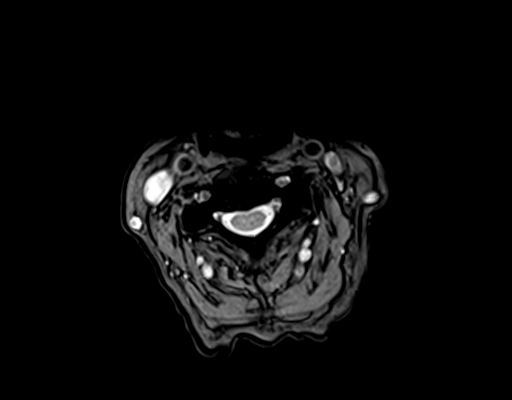
[im 22/31]
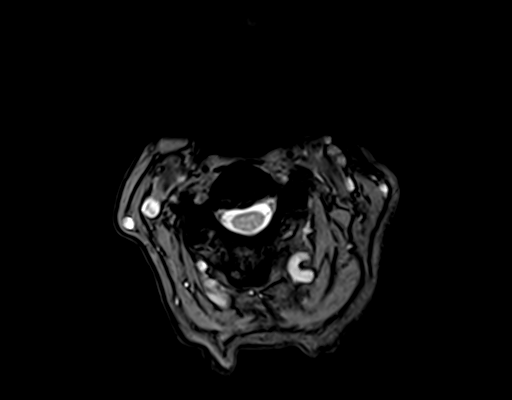
[im 26/31]
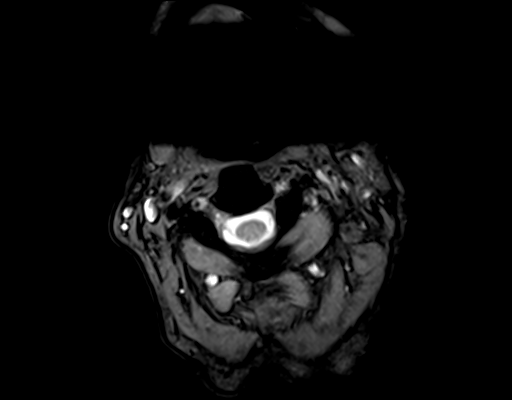
[im 31/31]
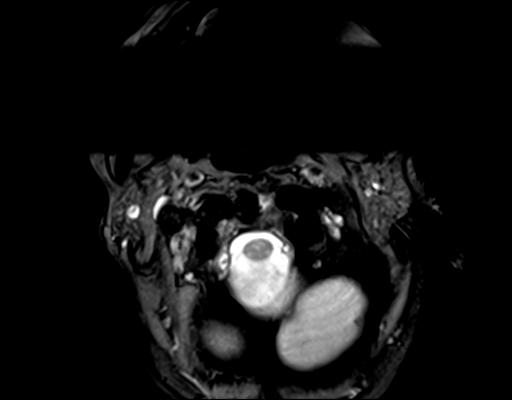

[Series 7: T2 · axial · 3.0mm · 0.62mm/px · z∈[-58,+35]mm · 9 of 31 slices shown (2 of 2)]
[im 1/31]
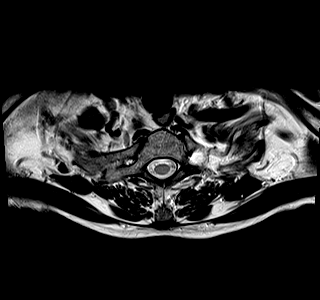
[im 5/31]
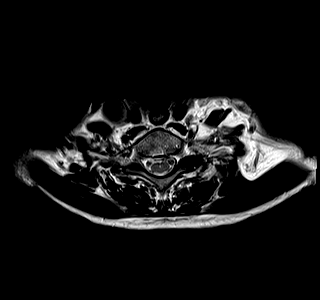
[im 9/31]
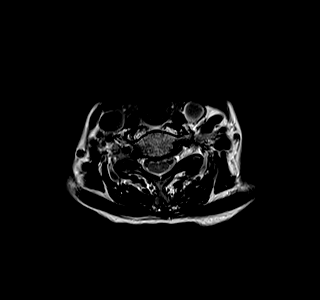
[im 13/31]
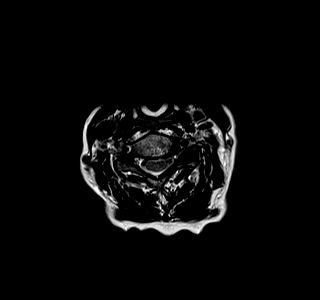
[im 16/31]
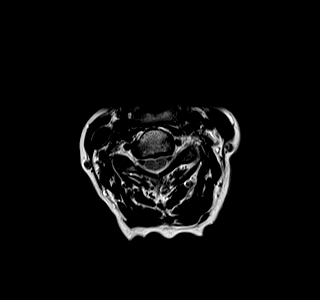
[im 18/31]
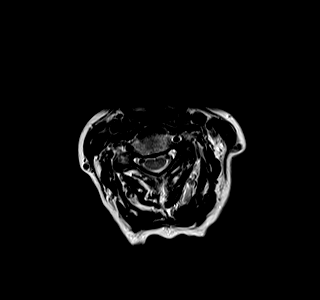
[im 22/31]
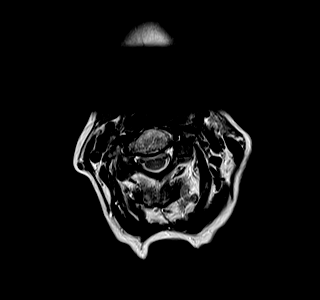
[im 26/31]
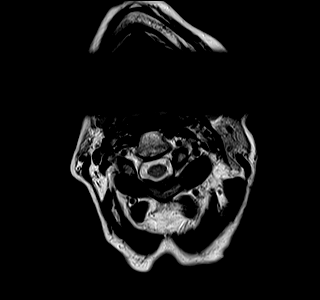
[im 31/31]
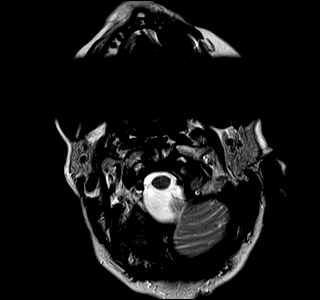

[30 of 48 positions shown; findings below may reference images not displayed]

FINDINGS: Alignment: Straightening without focal angulation or listhesis.

Vertebrae: Moderate endplate degenerative changes are present at
C5-6 and C6-7. There is also some edema within the right lateral
masses at C1-2. This appears degenerative, associated with mild
synovial thickening around the odontoid process. No evidence of
acute fracture or bone destruction.

Cord: Normal in signal and caliber.

Posterior Fossa, vertebral arteries, paraspinal tissues: Visualized
portions of the posterior fossa and paraspinal soft tissues appear
unremarkable. Bilateral vertebral artery flow voids.

Disc levels:

C1-2: Incompletely imaged in the axial plane. As above, probable
synovial thickening around the odontoid process and marrow edema
within the right lateral masses.

C2-3: The disc appears normal. Mild facet hypertrophy on the right.
No spinal stenosis or nerve root encroachment.

C3-4: Small central disc protrusion and mild bilateral facet
hypertrophy, worse on the left. Mild left foraminal narrowing. No
cord deformity.

C4-5: Loss of disc height with mild disc bulging. Probable ankylosis
of the left facet joint. No spinal stenosis or nerve root
encroachment.

C5-6: Loss of disc height with endplate osteophytes covering bulging
disc material. The CSF surrounding the cord is partially effaced.
There is no cord deformity. Moderate right and mild left foraminal
narrowing.

C6-7: Mild spondylosis with posterior osteophytes covering bulging
disc material. No cord deformity or foraminal compromise.

C7-T1: Asymmetric facet hypertrophy on the left. No spinal stenosis
or nerve root encroachment.
IMPRESSION: 1. Asymmetric arthropathy on the right at C1-2, potentially
contributing to right neck pain. Suggest plain film correlation.
2. Additional relatively mild multilevel spondylosis as described,
greatest at C5-6. At that level, there is moderate right and mild
left foraminal narrowing.
3. No cord deformity or acute osseous findings seen.

## 2018-11-03 ENCOUNTER — Other Ambulatory Visit: Payer: Self-pay | Admitting: Family Medicine

## 2018-11-03 NOTE — Telephone Encounter (Signed)
Requesting:valium Contract:yes UDS:low risk next screen 03/18/19 Last OV:09/15/18 Next OV:04/02/19 Last Refill:09/15/18  #[id number] Database:   Please advise

## 2018-11-15 DIAGNOSIS — S66819A Strain of other specified muscles, fascia and tendons at wrist and hand level, unspecified hand, initial encounter: Secondary | ICD-10-CM

## 2018-11-15 HISTORY — DX: Strain of other specified muscles, fascia and tendons at wrist and hand level, unspecified hand, initial encounter: S66.819A

## 2018-11-16 DIAGNOSIS — M25561 Pain in right knee: Secondary | ICD-10-CM | POA: Diagnosis not present

## 2018-11-16 DIAGNOSIS — M1711 Unilateral primary osteoarthritis, right knee: Secondary | ICD-10-CM | POA: Diagnosis not present

## 2018-11-20 DIAGNOSIS — M1812 Unilateral primary osteoarthritis of first carpometacarpal joint, left hand: Secondary | ICD-10-CM | POA: Diagnosis not present

## 2018-11-20 DIAGNOSIS — S66812A Strain of other specified muscles, fascia and tendons at wrist and hand level, left hand, initial encounter: Secondary | ICD-10-CM | POA: Diagnosis not present

## 2018-11-22 ENCOUNTER — Other Ambulatory Visit: Payer: Self-pay | Admitting: Orthopedic Surgery

## 2018-11-22 DIAGNOSIS — L820 Inflamed seborrheic keratosis: Secondary | ICD-10-CM | POA: Diagnosis not present

## 2018-11-22 DIAGNOSIS — L409 Psoriasis, unspecified: Secondary | ICD-10-CM | POA: Diagnosis not present

## 2018-11-22 DIAGNOSIS — D2271 Melanocytic nevi of right lower limb, including hip: Secondary | ICD-10-CM | POA: Diagnosis not present

## 2018-11-22 DIAGNOSIS — L821 Other seborrheic keratosis: Secondary | ICD-10-CM | POA: Diagnosis not present

## 2018-11-22 DIAGNOSIS — S66812A Strain of other specified muscles, fascia and tendons at wrist and hand level, left hand, initial encounter: Secondary | ICD-10-CM

## 2018-11-22 DIAGNOSIS — D2272 Melanocytic nevi of left lower limb, including hip: Secondary | ICD-10-CM | POA: Diagnosis not present

## 2018-11-30 ENCOUNTER — Ambulatory Visit
Admission: RE | Admit: 2018-11-30 | Discharge: 2018-11-30 | Disposition: A | Discharge status: Home / Self Care | Payer: Medicare Other | Source: Ambulatory Visit | Attending: Orthopedic Surgery | Admitting: Orthopedic Surgery

## 2018-11-30 DIAGNOSIS — S66812A Strain of other specified muscles, fascia and tendons at wrist and hand level, left hand, initial encounter: Secondary | ICD-10-CM

## 2018-11-30 DIAGNOSIS — S63512A Sprain of carpal joint of left wrist, initial encounter: Secondary | ICD-10-CM | POA: Diagnosis not present

## 2018-12-06 DIAGNOSIS — M25542 Pain in joints of left hand: Secondary | ICD-10-CM | POA: Diagnosis not present

## 2018-12-07 ENCOUNTER — Other Ambulatory Visit: Payer: Self-pay

## 2018-12-07 ENCOUNTER — Encounter (HOSPITAL_BASED_OUTPATIENT_CLINIC_OR_DEPARTMENT_OTHER): Payer: Self-pay | Admitting: *Deleted

## 2018-12-08 ENCOUNTER — Other Ambulatory Visit: Payer: Self-pay | Admitting: Orthopedic Surgery

## 2018-12-08 ENCOUNTER — Encounter (HOSPITAL_BASED_OUTPATIENT_CLINIC_OR_DEPARTMENT_OTHER)
Admission: RE | Admit: 2018-12-08 | Discharge: 2018-12-08 | Disposition: A | Discharge status: Home / Self Care | Payer: Medicare Other | Source: Ambulatory Visit | Attending: Orthopedic Surgery | Admitting: Orthopedic Surgery

## 2018-12-08 DIAGNOSIS — Z01818 Encounter for other preprocedural examination: Secondary | ICD-10-CM | POA: Diagnosis not present

## 2018-12-08 LAB — BASIC METABOLIC PANEL
Anion gap: 9 (ref 5–15)
BUN: 23 mg/dL (ref 8–23)
CO2: 29 mmol/L (ref 22–32)
Calcium: 9.7 mg/dL (ref 8.9–10.3)
Chloride: 97 mmol/L — ABNORMAL LOW (ref 98–111)
Creatinine, Ser: 0.81 mg/dL (ref 0.44–1.00)
GFR calc Af Amer: >60 mL/min (ref >60)
GFR calc non Af Amer: >60 mL/min (ref >60)
Glucose, Bld: 148 mg/dL — ABNORMAL HIGH (ref 70–99)
Potassium: 3.9 mmol/L (ref 3.5–5.1)
Sodium: 135 mmol/L (ref 135–145)

## 2018-12-11 NOTE — Progress Notes (Signed)
Dr. Gifford Shave notified of Glucose 148 and Cl 97- ok for surgery.

## 2018-12-12 ENCOUNTER — Ambulatory Visit (HOSPITAL_BASED_OUTPATIENT_CLINIC_OR_DEPARTMENT_OTHER): Payer: Medicare Other | Admitting: Anesthesiology

## 2018-12-12 ENCOUNTER — Encounter (HOSPITAL_BASED_OUTPATIENT_CLINIC_OR_DEPARTMENT_OTHER)
Admission: RE | Disposition: A | Discharge status: Home / Self Care | Payer: Self-pay | Source: Ambulatory Visit | Attending: Orthopedic Surgery

## 2018-12-12 ENCOUNTER — Ambulatory Visit (HOSPITAL_BASED_OUTPATIENT_CLINIC_OR_DEPARTMENT_OTHER)
Admission: RE | Admit: 2018-12-12 | Discharge: 2018-12-12 | Disposition: A | Discharge status: Home / Self Care | Payer: Medicare Other | Source: Ambulatory Visit | Attending: Orthopedic Surgery | Admitting: Orthopedic Surgery

## 2018-12-12 ENCOUNTER — Encounter (HOSPITAL_BASED_OUTPATIENT_CLINIC_OR_DEPARTMENT_OTHER): Payer: Self-pay | Admitting: Anesthesiology

## 2018-12-12 ENCOUNTER — Other Ambulatory Visit: Payer: Self-pay

## 2018-12-12 DIAGNOSIS — Z793 Long term (current) use of hormonal contraceptives: Secondary | ICD-10-CM | POA: Insufficient documentation

## 2018-12-12 DIAGNOSIS — M47892 Other spondylosis, cervical region: Secondary | ICD-10-CM | POA: Insufficient documentation

## 2018-12-12 DIAGNOSIS — M65842 Other synovitis and tenosynovitis, left hand: Secondary | ICD-10-CM | POA: Insufficient documentation

## 2018-12-12 DIAGNOSIS — S66117A Strain of flexor muscle, fascia and tendon of left little finger at wrist and hand level, initial encounter: Secondary | ICD-10-CM | POA: Insufficient documentation

## 2018-12-12 DIAGNOSIS — Z79899 Other long term (current) drug therapy: Secondary | ICD-10-CM | POA: Diagnosis not present

## 2018-12-12 DIAGNOSIS — F329 Major depressive disorder, single episode, unspecified: Secondary | ICD-10-CM | POA: Diagnosis not present

## 2018-12-12 DIAGNOSIS — I509 Heart failure, unspecified: Secondary | ICD-10-CM | POA: Diagnosis not present

## 2018-12-12 DIAGNOSIS — Z87442 Personal history of urinary calculi: Secondary | ICD-10-CM | POA: Diagnosis not present

## 2018-12-12 DIAGNOSIS — X58XXXA Exposure to other specified factors, initial encounter: Secondary | ICD-10-CM | POA: Insufficient documentation

## 2018-12-12 DIAGNOSIS — Z87891 Personal history of nicotine dependence: Secondary | ICD-10-CM | POA: Diagnosis not present

## 2018-12-12 DIAGNOSIS — M48061 Spinal stenosis, lumbar region without neurogenic claudication: Secondary | ICD-10-CM | POA: Diagnosis not present

## 2018-12-12 DIAGNOSIS — Z79890 Hormone replacement therapy (postmenopausal): Secondary | ICD-10-CM | POA: Insufficient documentation

## 2018-12-12 DIAGNOSIS — E119 Type 2 diabetes mellitus without complications: Secondary | ICD-10-CM | POA: Diagnosis not present

## 2018-12-12 DIAGNOSIS — F419 Anxiety disorder, unspecified: Secondary | ICD-10-CM | POA: Insufficient documentation

## 2018-12-12 DIAGNOSIS — Z791 Long term (current) use of non-steroidal anti-inflammatories (NSAID): Secondary | ICD-10-CM | POA: Diagnosis not present

## 2018-12-12 DIAGNOSIS — G20 Parkinson's disease: Secondary | ICD-10-CM | POA: Insufficient documentation

## 2018-12-12 DIAGNOSIS — S56116A Strain of flexor muscle, fascia and tendon of left ring finger at forearm level, initial encounter: Secondary | ICD-10-CM | POA: Diagnosis not present

## 2018-12-12 DIAGNOSIS — Z8551 Personal history of malignant neoplasm of bladder: Secondary | ICD-10-CM | POA: Insufficient documentation

## 2018-12-12 DIAGNOSIS — I10 Essential (primary) hypertension: Secondary | ICD-10-CM | POA: Diagnosis not present

## 2018-12-12 DIAGNOSIS — I110 Hypertensive heart disease with heart failure: Secondary | ICD-10-CM | POA: Diagnosis not present

## 2018-12-12 DIAGNOSIS — E785 Hyperlipidemia, unspecified: Secondary | ICD-10-CM | POA: Diagnosis not present

## 2018-12-12 DIAGNOSIS — M6788 Other specified disorders of synovium and tendon, other site: Secondary | ICD-10-CM | POA: Diagnosis not present

## 2018-12-12 DIAGNOSIS — S56118A Strain of flexor muscle, fascia and tendon of left little finger at forearm level, initial encounter: Secondary | ICD-10-CM | POA: Diagnosis not present

## 2018-12-12 DIAGNOSIS — M659 Synovitis and tenosynovitis, unspecified: Secondary | ICD-10-CM | POA: Diagnosis not present

## 2018-12-12 HISTORY — PX: FLEXOR TENDON REPAIR: SHX6501

## 2018-12-12 SURGERY — REPAIR, TENDON, FLEXOR
Anesthesia: Regional | Site: Finger | Laterality: Left

## 2018-12-12 MED ORDER — ROPIVACAINE HCL 5 MG/ML IJ SOLN
INTRAMUSCULAR | Status: DC | PRN
  Administered 2018-12-12: 30 mL via PERINEURAL

## 2018-12-12 MED ORDER — FENTANYL CITRATE (PF) 100 MCG/2ML IJ SOLN
INTRAMUSCULAR | Status: AC
  Filled 2018-12-12: qty 2

## 2018-12-12 MED ORDER — FENTANYL CITRATE (PF) 100 MCG/2ML IJ SOLN: 25.0000 ug | INTRAMUSCULAR | Status: DC | PRN

## 2018-12-12 MED ORDER — HYDROCODONE-ACETAMINOPHEN 5-325 MG PO TABS: 1.0000 | ORAL_TABLET | Freq: Four times a day (QID) | ORAL | 0 refills | Status: DC | PRN

## 2018-12-12 MED ORDER — DIPHENHYDRAMINE HCL 25 MG PO CAPS
ORAL_CAPSULE | ORAL | Status: AC
  Filled 2018-12-12: qty 1

## 2018-12-12 MED ORDER — LIDOCAINE 2% (20 MG/ML) 5 ML SYRINGE
INTRAMUSCULAR | Status: AC
  Filled 2018-12-12: qty 5

## 2018-12-12 MED ORDER — ONDANSETRON HCL 4 MG/2ML IJ SOLN
INTRAMUSCULAR | Status: AC
  Filled 2018-12-12: qty 2

## 2018-12-12 MED ORDER — VANCOMYCIN HCL IN DEXTROSE 1-5 GM/200ML-% IV SOLN
1000.0000 mg | INTRAVENOUS | Status: AC
  Administered 2018-12-12: 1000 mg via INTRAVENOUS

## 2018-12-12 MED ORDER — VANCOMYCIN HCL IN DEXTROSE 500-5 MG/100ML-% IV SOLN
INTRAVENOUS | Status: AC
  Filled 2018-12-12: qty 100

## 2018-12-12 MED ORDER — MIDAZOLAM HCL 2 MG/2ML IJ SOLN: 1.0000 mg | INTRAMUSCULAR | Status: DC | PRN

## 2018-12-12 MED ORDER — CHLORHEXIDINE GLUCONATE 4 % EX LIQD: 60.0000 mL | Freq: Once | CUTANEOUS | Status: DC

## 2018-12-12 MED ORDER — ONDANSETRON HCL 4 MG/2ML IJ SOLN
INTRAMUSCULAR | Status: DC | PRN
  Administered 2018-12-12: 4 mg via INTRAVENOUS

## 2018-12-12 MED ORDER — LACTATED RINGERS IV SOLN
INTRAVENOUS | Status: DC
  Administered 2018-12-12: 10:00:00 via INTRAVENOUS

## 2018-12-12 MED ORDER — DEXAMETHASONE SODIUM PHOSPHATE 10 MG/ML IJ SOLN
INTRAMUSCULAR | Status: AC
  Filled 2018-12-12: qty 1

## 2018-12-12 MED ORDER — DIPHENHYDRAMINE HCL 25 MG PO CAPS
25.0000 mg | ORAL_CAPSULE | Freq: Once | ORAL | Status: AC
  Administered 2018-12-12: 25 mg via ORAL

## 2018-12-12 MED ORDER — SCOPOLAMINE 1 MG/3DAYS TD PT72: 1.0000 | MEDICATED_PATCH | Freq: Once | TRANSDERMAL | Status: DC | PRN

## 2018-12-12 MED ORDER — FENTANYL CITRATE (PF) 100 MCG/2ML IJ SOLN
50.0000 ug | INTRAMUSCULAR | Status: DC | PRN
  Administered 2018-12-12: 50 ug via INTRAVENOUS

## 2018-12-12 MED ORDER — ONDANSETRON HCL 4 MG/2ML IJ SOLN: 4.0000 mg | Freq: Once | INTRAMUSCULAR | Status: DC | PRN

## 2018-12-12 MED ORDER — OXYCODONE HCL 5 MG/5ML PO SOLN: 5.0000 mg | Freq: Once | ORAL | Status: DC | PRN

## 2018-12-12 MED ORDER — OXYCODONE HCL 5 MG PO TABS: 5.0000 mg | ORAL_TABLET | Freq: Once | ORAL | Status: DC | PRN

## 2018-12-12 MED ORDER — FENTANYL CITRATE (PF) 100 MCG/2ML IJ SOLN
INTRAMUSCULAR | Status: DC | PRN
  Administered 2018-12-12 (×2): 50 ug via INTRAVENOUS

## 2018-12-12 SURGICAL SUPPLY — 59 items
BLADE SURG 15 STRL LF DISP TIS (BLADE) ×1 IMPLANT
BLADE SURG 15 STRL SS (BLADE) ×2
BNDG CMPR 9X4 STRL LF SNTH (GAUZE/BANDAGES/DRESSINGS) ×1
BNDG COHESIVE 3X5 TAN STRL LF (GAUZE/BANDAGES/DRESSINGS) ×2 IMPLANT
BNDG ESMARK 4X9 LF (GAUZE/BANDAGES/DRESSINGS) ×2 IMPLANT
BNDG GAUZE ELAST 4 BULKY (GAUZE/BANDAGES/DRESSINGS) ×1 IMPLANT
CHLORAPREP W/TINT 26ML (MISCELLANEOUS) ×2 IMPLANT
CORD BIPOLAR FORCEPS 12FT (ELECTRODE) ×2 IMPLANT
COVER BACK TABLE 60X90IN (DRAPES) ×2 IMPLANT
COVER MAYO STAND STRL (DRAPES) ×2 IMPLANT
CUFF TOURNIQUET SINGLE 18IN (TOURNIQUET CUFF) ×2 IMPLANT
DRAPE EXTREMITY T 121X128X90 (DISPOSABLE) ×2 IMPLANT
DRAPE SURG 17X23 STRL (DRAPES) ×2 IMPLANT
DRSG PAD ABDOMINAL 8X10 ST (GAUZE/BANDAGES/DRESSINGS) IMPLANT
GAUZE SPONGE 4X4 12PLY STRL (GAUZE/BANDAGES/DRESSINGS) ×2 IMPLANT
GAUZE XEROFORM 1X8 LF (GAUZE/BANDAGES/DRESSINGS) ×2 IMPLANT
GLOVE BIOGEL PI IND STRL 7.0 (GLOVE) IMPLANT
GLOVE BIOGEL PI IND STRL 8 (GLOVE) IMPLANT
GLOVE BIOGEL PI IND STRL 8.5 (GLOVE) ×1 IMPLANT
GLOVE BIOGEL PI INDICATOR 7.0 (GLOVE) ×1
GLOVE BIOGEL PI INDICATOR 8 (GLOVE) ×1
GLOVE BIOGEL PI INDICATOR 8.5 (GLOVE) ×1
GLOVE SURG ORTHO 8.0 STRL STRW (GLOVE) ×2 IMPLANT
GLOVE SURG SS PI 8.0 STRL IVOR (GLOVE) ×1 IMPLANT
GOWN STRL REUS W/ TWL LRG LVL3 (GOWN DISPOSABLE) ×1 IMPLANT
GOWN STRL REUS W/TWL LRG LVL3 (GOWN DISPOSABLE) ×2
GOWN STRL REUS W/TWL XL LVL3 (GOWN DISPOSABLE) ×2 IMPLANT
NS IRRIG 1000ML POUR BTL (IV SOLUTION) ×2 IMPLANT
PACK BASIN DAY SURGERY FS (CUSTOM PROCEDURE TRAY) ×2 IMPLANT
PAD CAST 3X4 CTTN HI CHSV (CAST SUPPLIES) ×1 IMPLANT
PAD CAST 4YDX4 CTTN HI CHSV (CAST SUPPLIES) IMPLANT
PADDING CAST ABS 3INX4YD NS (CAST SUPPLIES)
PADDING CAST ABS 4INX4YD NS (CAST SUPPLIES) ×1
PADDING CAST ABS COTTON 3X4 (CAST SUPPLIES) IMPLANT
PADDING CAST ABS COTTON 4X4 ST (CAST SUPPLIES) ×1 IMPLANT
PADDING CAST COTTON 3X4 STRL (CAST SUPPLIES) ×2
PADDING CAST COTTON 4X4 STRL (CAST SUPPLIES) ×2
SLING ARM FOAM STRAP LRG (SOFTGOODS) ×1 IMPLANT
SPLINT PLASTER CAST XFAST 3X15 (CAST SUPPLIES) IMPLANT
SPLINT PLASTER XTRA FASTSET 3X (CAST SUPPLIES) ×10
STOCKINETTE 4X48 STRL (DRAPES) ×2 IMPLANT
SUT CHROMIC 5 0 P 3 (SUTURE) IMPLANT
SUT ETHIBOND 3-0 V-5 (SUTURE) IMPLANT
SUT ETHILON 3 0 PS 1 (SUTURE) IMPLANT
SUT ETHILON 4 0 PS 2 18 (SUTURE) ×3 IMPLANT
SUT FIBERWIRE 4-0 18 DIAM BLUE (SUTURE)
SUT MERSILENE 2.0 SH NDLE (SUTURE) ×1 IMPLANT
SUT MERSILENE 4 0 P 3 (SUTURE) ×1 IMPLANT
SUT PROLENE 2 0 SH DA (SUTURE) IMPLANT
SUT PROLENE 5 0 P 3 (SUTURE) IMPLANT
SUT SILK 4 0 PS 2 (SUTURE) IMPLANT
SUT SUPRAMID 4-0 (SUTURE) IMPLANT
SUT VIC AB 4-0 P-3 18XBRD (SUTURE) IMPLANT
SUT VIC AB 4-0 P3 18 (SUTURE)
SUT VICRYL 4-0 PS2 18IN ABS (SUTURE) ×1 IMPLANT
SUTURE FIBERWR 4-0 18 DIA BLUE (SUTURE) IMPLANT
SYR BULB 3OZ (MISCELLANEOUS) ×2 IMPLANT
TOWEL GREEN STERILE FF (TOWEL DISPOSABLE) ×2 IMPLANT
UNDERPAD 30X30 (UNDERPADS AND DIAPERS) ×2 IMPLANT

## 2018-12-12 NOTE — Progress Notes (Signed)
Assisted Dr. Witman with left, ultrasound guided, supraclavicular block. Side rails up, monitors on throughout procedure. See vital signs in flow sheet. Tolerated Procedure well. °

## 2018-12-12 NOTE — Transfer of Care (Signed)
Immediate Anesthesia Transfer of Care Note  Patient: Stephanie [name]  Procedure(s) Performed: REPAIR/TRANSFER FLEXOR DIGITORUM PROFUNDUS OF LEFT SMALL FINGER (Left Finger)  Patient Location: PACU  Anesthesia Type:Regional and MAC combined with regional for post-op pain  Level of Consciousness: awake and patient cooperative  Airway & Oxygen Therapy: Patient Spontanous Breathing and Patient connected to face mask oxygen  Post-op Assessment: Report given to RN and Post -op Vital signs reviewed and stable  Post vital signs: Reviewed and stable  Last Vitals:  Vitals Value Taken Time  BP    Temp    Pulse 85 12/12/2018 11:41 AM  Resp 21 12/12/2018 11:41 AM  SpO2 95 % 12/12/2018 11:41 AM  Vitals shown include unvalidated device data.  Last Pain:  Vitals:   12/12/18 0938  TempSrc: Oral  PainSc: 0-No pain         Complications: No apparent anesthesia complications

## 2018-12-12 NOTE — Anesthesia Preprocedure Evaluation (Signed)
Anesthesia Evaluation  Patient identified by MRN, date of birth, ID band Patient awake    Reviewed: Allergy & Precautions, NPO status , Patient's Chart, lab work & pertinent test results  History of Anesthesia Complications Negative for: history of anesthetic complications  Airway Mallampati: III  TM Distance: >3 FB Neck ROM: Limited    Dental no notable dental hx.    Pulmonary neg pulmonary ROS, former smoker,    Pulmonary exam normal        Cardiovascular hypertension, Normal cardiovascular exam     Neuro/Psych PSYCHIATRIC DISORDERS Anxiety Depression Parkinson's disease    GI/Hepatic negative GI ROS, Neg liver ROS,   Endo/Other  negative endocrine ROS  Renal/GU negative Renal ROS  negative genitourinary   Musculoskeletal  (+) Arthritis ,   Abdominal   Peds  Hematology negative hematology ROS (+)   Anesthesia Other Findings   Reproductive/Obstetrics                             Anesthesia Physical Anesthesia Plan  ASA: III  Anesthesia Plan: Regional   Post-op Pain Management:    Induction:   PONV Risk Score and Plan: 2 and Propofol infusion and Treatment may vary due to age or medical condition  Airway Management Planned: Nasal Cannula and Simple Face Mask  Additional Equipment: None  Intra-op Plan:   Post-operative Plan:   Informed Consent: I have reviewed the patients History and Physical, chart, labs and discussed the procedure including the risks, benefits and alternatives for the proposed anesthesia with the patient or authorized representative who has indicated his/her understanding and acceptance.       Plan Discussed with:   Anesthesia Plan Comments:         Anesthesia Quick Evaluation

## 2018-12-12 NOTE — Brief Op Note (Signed)
12/12/2018  11:37 AM  PATIENT:  Stephanie [name]  85 y.o. female  PRE-OPERATIVE DIAGNOSIS:  RUPTURE FLEXOR TENDON OF LEFT SMALL FINGER  POST-OPERATIVE DIAGNOSIS:  RUPTURE FLEXOR TENDON OF LEFT SMALL FINGER  PROCEDURE:  Procedure(s): REPAIR/TRANSFER FLEXOR DIGITORUM PROFUNDUS OF LEFT SMALL FINGER (Left) And ring finger to the middle finger superficialis to the ring finger superficialis with tenosynovectomy of profundus facialis hand SURGEON:  Surgeon(s) and Role:    * Daryll Brod, MD - Primary  PHYSICIAN ASSISTANT:   ASSISTANTS: none   ANESTHESIA:   regional and IV sedation  EBL:  3 mL   BLOOD ADMINISTERED:none  DRAINS: none   LOCAL MEDICATIONS USED:  NONE  SPECIMEN:  Excision  DISPOSITION OF SPECIMEN:  PATHOLOGY  COUNTS:  YES  TOURNIQUET:   Total Tourniquet Time Documented: Upper Arm (Left) - 54 minutes Total: Upper Arm (Left) - 54 minutes   DICTATION: .Viviann Spare Dictation  PLAN OF CARE: Discharge to home after PACU  PATIENT DISPOSITION:  PACU - hemodynamically stable.

## 2018-12-12 NOTE — Discharge Instructions (Addendum)

## 2018-12-12 NOTE — H&P (Signed)
Stephanie [name] is an 85 y.o. female.   Chief Complaint: inAbility to flex left small finger. HPI: Stephanie [name] is a an 34 year old right-hand-dominant former patient has not been seen and at least 5 years. She comes in complaining of pain swelling on her left wrist with pain in the inability to fully flex her small finger. She has also complaining of pain at the base of her left thumb. This been going on for approximately 1 year as far as pain in her thumb with a VAS score 5-6 over 10 localized to the basilar joint. She has been taking diclofenac and does have diclofenac gel but has not used it on her thumb. Because no history of injury to her small finger but complains of pain and swelling in the wrist joint area. She had states she had a sharp pain a week ago. And has noticed the and incomplete flexion of the small finger since then. It is mild to moderate in nature. He has a history of arthritis history of diabetes thyroid problems or gout.  Family history is negative.  She was referred for MRI to evaluate the rest of the profundus tendon superficialis to the small finger. Is been done and read by Dr. Posey Pronto. Reveals that there is a high-grade tendinitis or rupture of the flexor tendons to the small finger.   Past Medical History:  Diagnosis Date  . Arthropathy of cervical spine 11/27/2012   Right  Xray at [facility]  On 04/08/2016  AP, lateral, and lateral flexion and extension views of the cervical spine are submitted for evaluation.   No acute fracture identified in the cervical spine.   There is redemonstration of approximately 2 mm of C3 on C4 anterolisthesis in neutral which slightly increases in flexion relative to neutral and extension. Slight C5 on C6 retrolisthesis does not change i  . Benign hypertension without congestive heart failure   . Benign mole    right hcets mole, bleeds when dried with towel  . Bilateral dry eyes   . Bladder cancer ([place])   . Bladder cancer ([place]) 10/17/2016  . Cervical  spondylosis   . Cervicalgia   . Depression with anxiety 10/17/2016  . Gross hematuria   . History of kidney stones    1970's  . Lumbar stenosis   . Malignant tumor of urinary bladder ([place]) 07/2016   Pre-stage I  . OA (osteoarthritis)   . Parkinson disease [facility]) dx 2012   neurologist-  dr siddiqui at [facility]    Past Surgical History:  Procedure Laterality Date  . BIOPSY MASS LEFT FIRST METACARPAL   06/23/2006   benign  . CATARACT EXTRACTION W/ INTRAOCULAR LENS  IMPLANT, BILATERAL  1999 and 2003  . CERVICAL FUSION  02/2017   c1-c2 ; reports she has 2 screws 2in long in place done at [facility] ; patient exhibits VERY LIMITED NECK ROM  . CYSTOSCOPY W/ RETROGRADES Bilateral 09/22/2016   Procedure: CYSTOSCOPY WITH RETROGRADE PYELOGRAM;  Surgeon: Alexis Frock, MD;  [facility];  Service: Urology;  Laterality: Bilateral;  . D & C HYSTEROSCOPY W/ RESECTION POLYP  07/11/2000  . DILATION AND CURETTAGE OF UTERUS  1960's  . EXCISION CYST AND DEBRIDEMENT RIGHT WIRST AND REMOVAL [place] BODY  01/09/2009  . LAPAROSCOPY  yrs ago   infertility   . METACARPOPHALANGEAL JOINT ARTHRODESIS Right 05/25/1996  . TONSILLECTOMY AND ADENOIDECTOMY  child  . TOTAL HIP ARTHROPLASTY Left 07/07/2016   Procedure: LEFT TOTAL HIP ARTHROPLASTY ANTERIOR APPROACH;  Surgeon: Gaynelle Arabian, MD;  Location: [facility];  Service: Orthopedics;  Laterality: Left;  . TOTAL HIP ARTHROPLASTY Right 09/28/2017   Procedure: RIGHT TOTAL HIP ARTHROPLASTY ANTERIOR APPROACH;  Surgeon: Gaynelle Arabian, MD;  Location: [facility];  Service: Orthopedics;  Laterality: Right;  . TRANSURETHRAL RESECTION OF BLADDER TUMOR N/A 09/22/2016   Procedure: TRANSURETHRAL RESECTION OF BLADDER TUMOR (TURBT);  Surgeon: Alexis Frock, MD;  Location: [facility];  Service: Urology;  Laterality: N/A;    Family History  Problem Relation Age of Onset  . Arthritis Mother   . Hypertension Mother   . Deep vein  thrombosis Mother        recurrent, secondary to Bleeding disorder  . Arthritis Father   . Stroke Father   . Diabetes Sister   . Heart disease Sister   . Obesity Sister   . Arthritis Sister   . Hypertension Sister   . Heart disease Maternal Grandmother   . Heart disease Maternal Grandfather   . Peripheral vascular disease Paternal Grandmother        s/p leg amputation  . Stroke Paternal Grandfather    Social History:  reports that she quit smoking about 54 years ago. She quit after 10.00 years of use. She has never used smokeless tobacco. She reports current alcohol use of about 11.0 standard drinks of alcohol per week. She reports that she does not use drugs.  Allergies:  Allergies  Allergen Reactions  . Bee Venom Swelling    Yellow jackets, white and black face hornets, local swelling only  . Codeine Nausea And Vomiting  . Hornet Venom   . Penicillins Itching and Swelling    Has patient had a PCN reaction causing immediate rash, facial/tongue/throat swelling, SOB or lightheadedness with hypotension: Yes Has patient had a PCN reaction causing severe rash involving mucus membranes or skin necrosis: No Has patient had a PCN reaction that required hospitalization: No Has patient had a PCN reaction occurring within the last 10 years: No If all of the above answers are "NO", then may proceed with Cephalosporin use.   . Lodine [Etodolac] Rash  . Tramadol Itching and Rash    Medications Prior to Admission  Medication Sig Dispense Refill  . ACIDOPHILUS LACTOBACILLUS PO Take by mouth.    . Biotin 5000 MCG SUBL Place under the tongue.    . carbidopa-levodopa (SINEMET IR) 25-100 MG per tablet Take 1 tablet by mouth 4 (four) times daily.     . Carbidopa-Levodopa ER (SINEMET CR) 25-100 MG tablet controlled release     . Cholecalciferol (VITAMIN D-1000 MAX ST) 1000 units tablet Take by mouth.    Marland Kitchen Co-Enzyme Q-10 30 MG CAPS Take by mouth.    . diazepam (VALIUM) 5 MG tablet TAKE 1 TABLET  BY MOUTH EVERY 12 HOURS AS NEEDED FOR ANXIETY 60 tablet 0  . diclofenac (VOLTAREN) 50 MG EC tablet Take by mouth.    . estradiol (ESTRACE) 0.5 MG tablet TAKE 1/2 TABLET BY MOUTH EVERY DAY IN AM 45 tablet 4  . fluticasone (CUTIVATE) 0.05 % cream APPLY TO AFFECTED AREA TWICE A DAY  1  . hydrochlorothiazide (HYDRODIURIL) 25 MG tablet TAKE 1 AND 1/2 TABLETS BY MOUTH DAILY 45 tablet 5  . Multiple Vitamin (MULTI-VITAMINS) TABS Take by mouth.    Vladimir Faster Glycol-Propyl Glycol (SYSTANE) 0.4-0.3 % SOLN Apply 1 drop to eye 2 (two) times daily.     . sertraline (ZOLOFT) 25 MG tablet Take 1 tablet (25 mg total) by  mouth every morning. 90 tablet 1  . Turmeric 500 MG TABS Take 1,000 mg by mouth.    Marland Kitchen acetaminophen (TYLENOL) 500 MG tablet Take 1,000 mg by mouth 3 (three) times daily.     Marland Kitchen loratadine (CLARITIN) 10 MG tablet Take 10 mg by mouth daily.    . medroxyPROGESTERone (PROVERA) 2.5 MG tablet Take 0.5 tablets (1.25 mg total) by mouth daily. 45 tablet 4  . senna-docusate (SENOKOT-S) 8.6-50 MG tablet Take by mouth.      No results found for this or any previous visit (from the past 48 hour(s)).  No results found.   Pertinent items are noted in HPI.  Height 5\' 2"  (1.575 m), weight 57.2 kg, last menstrual period 01/13/2013.  General appearance: alert, cooperative and appears stated age Head: Normocephalic, without obvious abnormality Neck: no JVD Resp: clear to auscultation bilaterally Cardio: regular rate and rhythm, S1, S2 normal, no murmur, click, rub or gallop GI: soft, non-tender; bowel sounds normal; no masses,  no organomegaly Extremities: loss flexion left small finger Pulses: 2+ and symmetric Skin: Skin color, texture, turgor normal. No rashes or lesions Neurologic: Grossly normal Incision/Wound: na  Assessment/Plan Assessment:   Tenosynovitis of the ruptured flexor tendons left hand   Plan: Have discussed exploring the flexor tendons to the small finger with possible tendon  transfer to the ring or repair as dictated by findings. Pre-peri-and postoperative course are discussed along with risks and complications. She is aware there is no guarantee to the surgery possibility of infection rupture and injury to arteries nerves tendons. She would like to proceed and this is scheduled as an outpatient under regional anesthesia for repair reconstruction or tendon transfer left small    Daryll Brod 12/12/2018, 9:11 AM

## 2018-12-12 NOTE — Anesthesia Procedure Notes (Signed)
Anesthesia Regional Block: Supraclavicular block   Pre-Anesthetic Checklist: ,, timeout performed, Correct Patient, Correct Site, Correct Laterality, Correct Procedure, Correct Position, site marked, Risks and benefits discussed,  Surgical consent,  Pre-op evaluation,  At surgeon's request and post-op pain management  Laterality: Left  Prep: chloraprep       Needles:  Injection technique: Single-shot  Needle Type: Echogenic Stimulator Needle     Needle Length: 9cm  Needle Gauge: 21     Additional Needles:   Procedures:,,,, ultrasound used (permanent image in chart),,,,  Narrative:  Start time: 12/12/2018 10:05 AM End time: 12/12/2018 10:11 AM Injection made incrementally with aspirations every 5 mL.  Performed by: Personally  Anesthesiologist: Lidia Collum, MD  Additional Notes: Monitors applied. Injection made in 5cc increments. No resistance to injection. Good needle visualization. Patient tolerated procedure well.

## 2018-12-12 NOTE — Anesthesia Postprocedure Evaluation (Signed)
Anesthesia Post Note  Patient: Stephanie [name]  Procedure(s) Performed: REPAIR/TRANSFER FLEXOR DIGITORUM PROFUNDUS OF LEFT SMALL FINGER (Left Finger)     Patient location during evaluation: PACU Anesthesia Type: Regional Level of consciousness: awake and alert Pain management: pain level controlled Vital Signs Assessment: post-procedure vital signs reviewed and stable Respiratory status: spontaneous breathing, nonlabored ventilation and respiratory function stable Cardiovascular status: blood pressure returned to baseline and stable Postop Assessment: no apparent nausea or vomiting Anesthetic complications: no    Last Vitals:  Vitals:   12/12/18 1200 12/12/18 1309  BP: (!) 154/81 (!) 142/82  Pulse: 83 91  Resp: 18 16  Temp:  36.4 C  SpO2: 95% 98%    Last Pain:  Vitals:   12/12/18 1309  TempSrc: Oral  PainSc: 0-No pain                 Lidia Collum

## 2018-12-12 NOTE — Op Note (Signed)
NAME: Stephanie [name] MEDICAL RECORD NO: [id number] DATE OF BIRTH: 1932/02/14 FACILITY: [facility] LOCATION: [facility] PHYSICIAN: Wynonia Sours, MD   OPERATIVE REPORT   DATE OF PROCEDURE: 12/12/18    PREOPERATIVE DIAGNOSIS:   Rupture superficialis profundus tendon left small finger   POSTOPERATIVE DIAGNOSIS:   Ruptured superficialis undistended left small finger with tenosynovitis profundus tendons incipient rupture of ring finger profundus tendon left hand   PROCEDURE:   Teno profundus tendon ring and small finger to middle finger profundus tendon hand synovectomy flexor tendons with transfer of superficialis flexor small finger to ring finger and transfer of   SURGEON: Daryll Brod, M.D.   ASSISTANT: none   ANESTHESIA:  Regional with sedation   INTRAVENOUS FLUIDS:  Per anesthesia flow sheet.   ESTIMATED BLOOD LOSS:  Minimal.   COMPLICATIONS:  None.   SPECIMENS:   Tenosynovial tissue   TOURNIQUET TIME:    Total Tourniquet Time Documented: Upper Arm (Left) - 54 minutes Total: Upper Arm (Left) - 54 minutes    DISPOSITION:  Stable to PACU.   INDICATIONS: Patient is a 85 year old female who comes in with a complaint of the inability to flex her small finger with pain in her ring finger left hand I reveals rupture of the profundus tendon of the small finger with significant tenosynovitis left hand flexor tendons.  She is desirous of reconstruction to regain flexion of the small finger.  Pre-peri-and postoperative course are discussed along with risks and complications.  She is aware that there is no guarantee to the surgery the possibility of infection recurrence injury to arteries nerves tendons incomplete relief of symptoms and dystrophy.  The possibility of stiffness in the preoperative area the patient is seen the extremity marked by both patient and surgeon antibiotic given  OPERATIVE COURSE: After a supraclavicular block was carried out without difficulty in  the preoperative area under the direction of the anesthesia department.  The patient was brought to the operating room where she was prepped and draped in the supine position with a left arm free.  A three-minute dry time was allowed timeout taken to confirm patient procedure.  The limb was exsanguinated with an Esmarch bandage turn placed on the arm was inflated to 250 mmHg.  An incision was made in the distal forearm up to the flexion crease of the wrist carried down through subcutaneous tissue..  Bleeders were electrocauterized.  A very significant tenosynovitis was immediately apparent.  The median and ulnar nerve were identified protected.  The profundus tendon was found to be encased in synovial tissue.  An obvious near rupture of the profundus tendon mass was immediately apparent.  With traction this was found to be the ring finger.  Rupture of the superficialis to the small finger was identified.  The superficialis of the ring finger was intact Tina synovectomy was then performed the distal margins of the tendon ruptures could not be seen in the wound was extended through the carpal canal to the level of the official palmar arch.  Action to the median and ulnar nerve the flexor retinaculum was incised allowing visualization of the marked tenosynovium proliferation through the entire carpal canal.  Profundus tendon to the small finger was identified distally the near rupture of the superficialis proceeded over a length from the distal third of the forearm to the level of the lumbricals distally.  If can fraying and near complete rupture of the profundus to the ring finger was noted.  The canal was  explored no exposed bone or osteophytes were noted hamate hook showed no fracture is no erosions of bone through the canal or distal radius or ulna.  Tenosynovial tissue was then resected over the entire course the superficialis of the small finger was immediately apparent was adjacent to the superficialis of the  ring finger this was then sutured to it with figure-of-eight sutures and a side-to-side anastomosis.  The profundus tendon to the small finger was identified distally this was sutured in a side-to-side using 2-0 M[place]ilene sutures to the ring finger.  The incipient rupture of the ring finger was then repaired to the profundus of the middle finger using the side to side 2-0 [place]il[place] this was done with the fingers and a moderately flexed position with slight increased flexion to the ring and small as compared to the middle finger.  The remainder of the tendon was then imbricated into itself by placing the course of the frayed tendon closing it to the middle finger flexor tendon of the entire course back to the distal forearm.  With flexion of the fundus wide distally full flexion of the's ring and small finger was immediately apparent with full extension being possible.  The wound was copiously irrigated with saline.  The tenosynovial tissue was sent to pathology.  The subcutaneous tissue was closed with interrupted 4-0 Vicryl and the skin with interrupted 4-0 nylon sutures.  Sterile compressive dressing leaving the index  and thumb free.  Sterile splint was applied.  On deflation of the tourniquet all fingers immediately pink.  She was taken to the recovery room for observation in satisfactory condition.  She will be discharged home to return to [facility] in 1 week on Norco.   Daryll Brod, MD Electronically signed, 12/12/18

## 2018-12-13 ENCOUNTER — Encounter (HOSPITAL_BASED_OUTPATIENT_CLINIC_OR_DEPARTMENT_OTHER): Payer: Self-pay | Admitting: Orthopedic Surgery

## 2018-12-27 DIAGNOSIS — M79645 Pain in left finger(s): Secondary | ICD-10-CM | POA: Diagnosis not present

## 2018-12-27 DIAGNOSIS — S66812A Strain of other specified muscles, fascia and tendons at wrist and hand level, left hand, initial encounter: Secondary | ICD-10-CM | POA: Diagnosis not present

## 2018-12-27 DIAGNOSIS — M25642 Stiffness of left hand, not elsewhere classified: Secondary | ICD-10-CM | POA: Diagnosis not present

## 2019-01-04 DIAGNOSIS — M25642 Stiffness of left hand, not elsewhere classified: Secondary | ICD-10-CM | POA: Diagnosis not present

## 2019-01-04 DIAGNOSIS — M79645 Pain in left finger(s): Secondary | ICD-10-CM | POA: Diagnosis not present

## 2019-01-04 DIAGNOSIS — S66812A Strain of other specified muscles, fascia and tendons at wrist and hand level, left hand, initial encounter: Secondary | ICD-10-CM | POA: Diagnosis not present

## 2019-01-11 DIAGNOSIS — M79645 Pain in left finger(s): Secondary | ICD-10-CM | POA: Diagnosis not present

## 2019-01-11 DIAGNOSIS — S66812A Strain of other specified muscles, fascia and tendons at wrist and hand level, left hand, initial encounter: Secondary | ICD-10-CM | POA: Diagnosis not present

## 2019-01-11 DIAGNOSIS — M25642 Stiffness of left hand, not elsewhere classified: Secondary | ICD-10-CM | POA: Diagnosis not present

## 2019-01-15 HISTORY — PX: TENDON REPAIR: SHX5111

## 2019-01-17 DIAGNOSIS — M25642 Stiffness of left hand, not elsewhere classified: Secondary | ICD-10-CM | POA: Diagnosis not present

## 2019-01-22 DIAGNOSIS — Z961 Presence of intraocular lens: Secondary | ICD-10-CM | POA: Diagnosis not present

## 2019-01-22 DIAGNOSIS — H35371 Puckering of macula, right eye: Secondary | ICD-10-CM | POA: Diagnosis not present

## 2019-01-22 DIAGNOSIS — H26492 Other secondary cataract, left eye: Secondary | ICD-10-CM | POA: Diagnosis not present

## 2019-01-22 DIAGNOSIS — H04123 Dry eye syndrome of bilateral lacrimal glands: Secondary | ICD-10-CM | POA: Diagnosis not present

## 2019-01-25 DIAGNOSIS — M25532 Pain in left wrist: Secondary | ICD-10-CM | POA: Diagnosis not present

## 2019-01-25 DIAGNOSIS — M25642 Stiffness of left hand, not elsewhere classified: Secondary | ICD-10-CM | POA: Diagnosis not present

## 2019-01-25 DIAGNOSIS — M79645 Pain in left finger(s): Secondary | ICD-10-CM | POA: Diagnosis not present

## 2019-01-26 ENCOUNTER — Telehealth: Payer: Self-pay | Admitting: Family Medicine

## 2019-01-26 NOTE — Telephone Encounter (Signed)
Copied from [place] [id number]. Topic: Quick Communication - Rx Refill/Question >> Jan 26, 2019  2:22 PM Ahmed Prima L wrote: Medication: diazepam (VALIUM) 5 MG tablet  Has the patient contacted their pharmacy? Yes four days ago (Agent: If no, request that the patient contact the pharmacy for the refill.) (Agent: If yes, when and what did the pharmacy advise?)  Preferred Pharmacy (with phone number or street name):  [id number] [address], [place] - [address] [place] [address] Phone: [phone] Fax: [phone]    Agent: Please be advised that RX refills may take up to 3 business days. We ask that you follow-up with your pharmacy.

## 2019-01-27 ENCOUNTER — Other Ambulatory Visit: Payer: Self-pay | Admitting: Family Medicine

## 2019-01-28 ENCOUNTER — Other Ambulatory Visit: Payer: Self-pay | Admitting: Family Medicine

## 2019-01-28 MED ORDER — DIAZEPAM 5 MG PO TABS: 5.0000 mg | ORAL_TABLET | Freq: Two times a day (BID) | ORAL | 1 refills | Status: DC | PRN

## 2019-01-28 NOTE — Telephone Encounter (Signed)
Notify I sent in her Refill do not see any evidence in the chart that her pharmacy had contacted [place]. Do you agree?

## 2019-01-29 NOTE — Telephone Encounter (Signed)
Do not see where pharmacy requested either.

## 2019-02-23 ENCOUNTER — Other Ambulatory Visit: Payer: Self-pay | Admitting: Family Medicine

## 2019-02-28 DIAGNOSIS — M7121 Synovial cyst of popliteal space [Baker], right knee: Secondary | ICD-10-CM | POA: Diagnosis not present

## 2019-02-28 DIAGNOSIS — M25561 Pain in right knee: Secondary | ICD-10-CM | POA: Diagnosis not present

## 2019-03-13 DIAGNOSIS — M669 Spontaneous rupture of unspecified tendon: Secondary | ICD-10-CM | POA: Diagnosis not present

## 2019-03-13 DIAGNOSIS — L409 Psoriasis, unspecified: Secondary | ICD-10-CM | POA: Diagnosis not present

## 2019-03-13 DIAGNOSIS — M150 Primary generalized (osteo)arthritis: Secondary | ICD-10-CM | POA: Diagnosis not present

## 2019-03-13 DIAGNOSIS — Z6823 Body mass index (BMI) 23.0-23.9, adult: Secondary | ICD-10-CM | POA: Diagnosis not present

## 2019-03-13 DIAGNOSIS — M064 Inflammatory polyarthropathy: Secondary | ICD-10-CM | POA: Diagnosis not present

## 2019-03-13 DIAGNOSIS — R5383 Other fatigue: Secondary | ICD-10-CM | POA: Diagnosis not present

## 2019-03-13 DIAGNOSIS — M2550 Pain in unspecified joint: Secondary | ICD-10-CM | POA: Diagnosis not present

## 2019-03-15 DIAGNOSIS — M25551 Pain in right hip: Secondary | ICD-10-CM | POA: Diagnosis not present

## 2019-03-15 DIAGNOSIS — M25642 Stiffness of left hand, not elsewhere classified: Secondary | ICD-10-CM | POA: Diagnosis not present

## 2019-03-15 DIAGNOSIS — M79645 Pain in left finger(s): Secondary | ICD-10-CM | POA: Diagnosis not present

## 2019-03-15 DIAGNOSIS — S66812A Strain of other specified muscles, fascia and tendons at wrist and hand level, left hand, initial encounter: Secondary | ICD-10-CM | POA: Diagnosis not present

## 2019-03-15 DIAGNOSIS — M25532 Pain in left wrist: Secondary | ICD-10-CM | POA: Diagnosis not present

## 2019-03-15 DIAGNOSIS — M1711 Unilateral primary osteoarthritis, right knee: Secondary | ICD-10-CM | POA: Diagnosis not present

## 2019-03-29 DIAGNOSIS — M2550 Pain in unspecified joint: Secondary | ICD-10-CM | POA: Diagnosis not present

## 2019-03-29 DIAGNOSIS — M150 Primary generalized (osteo)arthritis: Secondary | ICD-10-CM | POA: Diagnosis not present

## 2019-03-29 DIAGNOSIS — L4050 Arthropathic psoriasis, unspecified: Secondary | ICD-10-CM | POA: Diagnosis not present

## 2019-03-29 DIAGNOSIS — L409 Psoriasis, unspecified: Secondary | ICD-10-CM | POA: Diagnosis not present

## 2019-03-29 DIAGNOSIS — M669 Spontaneous rupture of unspecified tendon: Secondary | ICD-10-CM | POA: Diagnosis not present

## 2019-04-02 ENCOUNTER — Encounter: Payer: Medicare Other | Admitting: Family Medicine

## 2019-04-02 ENCOUNTER — Encounter: Payer: Self-pay | Admitting: Family Medicine

## 2019-04-02 ENCOUNTER — Other Ambulatory Visit: Payer: Self-pay

## 2019-04-02 ENCOUNTER — Ambulatory Visit (INDEPENDENT_AMBULATORY_CARE_PROVIDER_SITE_OTHER): Payer: Medicare Other | Admitting: Family Medicine

## 2019-04-02 DIAGNOSIS — L4050 Arthropathic psoriasis, unspecified: Secondary | ICD-10-CM

## 2019-04-02 DIAGNOSIS — I10 Essential (primary) hypertension: Secondary | ICD-10-CM | POA: Diagnosis not present

## 2019-04-02 DIAGNOSIS — F418 Other specified anxiety disorders: Secondary | ICD-10-CM

## 2019-04-02 DIAGNOSIS — E7800 Pure hypercholesterolemia, unspecified: Secondary | ICD-10-CM | POA: Diagnosis not present

## 2019-04-02 NOTE — Assessment & Plan Note (Signed)
Doing well with complaints today.

## 2019-04-02 NOTE — Assessment & Plan Note (Addendum)
Has been referred to dr Gavin Pound of Rheumatology who made the diagnosis. They are getting ready to get the patient set up on a biologic medicaiton, Simponi Aria ultimately IV dosing every other month if she tolerates it. She has significant rash on scalp as well as joint pain and swelling so they are hoping this will help both.

## 2019-04-02 NOTE — Assessment & Plan Note (Signed)
Encouraged heart healthy diet, increase exercise, avoid trans fats, consider a krill oil cap daily 

## 2019-04-02 NOTE — Assessment & Plan Note (Signed)
Well controlled, no changes to meds. Encouraged heart healthy diet such as the DASH diet and exercise as tolerated.  °

## 2019-04-02 NOTE — Progress Notes (Signed)
Virtual Visit via Telephone Note  I connected with Stephanie [name] on 04/02/19 at  3:40 PM EDT by a Telephone enabled telemedicine application and verified that I am speaking with the correct person using two identifiers. Alinda Dooms CMA attempted to get the patient set up on a video visit but was unsuccessful so visit was completed via telephone encounter.   Location: Patient: home Provider: home   I discussed the limitations of evaluation and management by telemedicine and the availability of in person appointments. The patient expressed understanding and agreed to proceed.    Subjective:    Patient ID: Stephanie [name], female    DOB: [birth year], 85 y.o.   MRN: [id number]  Chief Complaint  Patient presents with  . Hypertension  . Follow-up  . Sinus Problem    Pt states having left ear pain when blowing nose and left gland is swollen. Pt states no fever or cough    HPI Patient is in today for follow up on chronic medical concerns including new diagnosis of psoriatic arhtritis. She was referred there by her orthopaedist to investigate her tendon ruptures and inflammation. Still struggling with significant pain in her right knee. She has a Baker's Cyst in her right knee that has been drained but has already filled up again. She is following with Dr Maureen Ralphs of ortho and they have xrayed her knee and confirmed that she has bone on bone arthritis and that she needs surgery to really be able to keep moving. She is otherwise managing the pandemic well and is taking good care of her herself and eating well. Denies CP/palp/SOB/HA/congestion/fevers/GI or GU c/o. Taking meds as prescribed   Past Medical History:  Diagnosis Date  . Arthropathy of cervical spine 11/27/2012   Right  Xray at [facility]  On 04/08/2016  AP, lateral, and lateral flexion and extension views of the cervical spine are submitted for evaluation.   No acute fracture identified in the cervical spine.   There is redemonstration of  approximately 2 mm of C3 on C4 anterolisthesis in neutral which slightly increases in flexion relative to neutral and extension. Slight C5 on C6 retrolisthesis does not change i  . Benign hypertension without congestive heart failure   . Benign mole    right hcets mole, bleeds when dried with towel  . Bilateral dry eyes   . Bladder cancer ([place])   . Bladder cancer ([place]) 10/17/2016  . Cervical spondylosis   . Cervicalgia   . Depression with anxiety 10/17/2016  . Gross hematuria   . History of kidney stones    1970's  . Lumbar stenosis   . Malignant tumor of urinary bladder ([place]) 07/2016   Pre-stage I  . OA (osteoarthritis)   . Parkinson disease [facility]) dx 2012   neurologist-  dr siddiqui at [facility]    Past Surgical History:  Procedure Laterality Date  . BIOPSY MASS LEFT FIRST METACARPAL   06/23/2006   benign  . CATARACT EXTRACTION W/ INTRAOCULAR LENS  IMPLANT, BILATERAL  1999 and 2003  . CERVICAL FUSION  02/2017   c1-c2 ; reports she has 2 screws 2in long in place done [facility] ; patient exhibits VERY LIMITED NECK ROM  . CYSTOSCOPY W/ RETROGRADES Bilateral 09/22/2016   Procedure: CYSTOSCOPY WITH RETROGRADE PYELOGRAM;  Surgeon: Alexis Frock, MD;  Location: [facility];  Service: Urology;  Laterality: Bilateral;  . D & C HYSTEROSCOPY W/ RESECTION POLYP  07/11/2000  . DILATION AND CURETTAGE OF UTERUS  1960's  . EXCISION CYST AND DEBRIDEMENT RIGHT WIRST AND REMOVAL [place] BODY  01/09/2009  . FLEXOR TENDON REPAIR Left 12/12/2018   Procedure: REPAIR/TRANSFER FLEXOR DIGITORUM PROFUNDUS OF LEFT SMALL FINGER;  Surgeon: Daryll Brod, MD;  Location: [facility];  Service: Orthopedics;  Laterality: Left;  . LAPAROSCOPY  yrs ago   infertility   . METACARPOPHALANGEAL JOINT ARTHRODESIS Right 05/25/1996  . TONSILLECTOMY AND ADENOIDECTOMY  child  . TOTAL HIP ARTHROPLASTY Left 07/07/2016   Procedure: LEFT TOTAL HIP ARTHROPLASTY ANTERIOR APPROACH;  Surgeon:  Gaynelle Arabian, MD;  Location: [facility];  Service: Orthopedics;  Laterality: Left;  . TOTAL HIP ARTHROPLASTY Right 09/28/2017   Procedure: RIGHT TOTAL HIP ARTHROPLASTY ANTERIOR APPROACH;  Surgeon: Gaynelle Arabian, MD;  Location: [facility];  Service: Orthopedics;  Laterality: Right;  . TRANSURETHRAL RESECTION OF BLADDER TUMOR N/A 09/22/2016   Procedure: TRANSURETHRAL RESECTION OF BLADDER TUMOR (TURBT);  Surgeon: Alexis Frock, MD;  Location: [facility];  Service: Urology;  Laterality: N/A;    Family History  Problem Relation Age of Onset  . Arthritis Mother   . Hypertension Mother   . Deep vein thrombosis Mother        recurrent, secondary to Bleeding disorder  . Arthritis Father   . Stroke Father   . Diabetes Sister   . Heart disease Sister   . Obesity Sister   . Arthritis Sister   . Hypertension Sister   . Heart disease Maternal Grandmother   . Heart disease Maternal Grandfather   . Peripheral vascular disease Paternal Grandmother        s/p leg amputation  . Stroke Paternal Grandfather     Social History   Socioeconomic History  . Marital status: Widowed    Spouse name: Not on file  . Number of children: Not on file  . Years of education: Not on file  . Highest education level: Not on file  Occupational History  . Not on file  Social Needs  . Financial resource strain: Not on file  . Food insecurity:    Worry: Not on file    Inability: Not on file  . Transportation needs:    Medical: Not on file    Non-medical: Not on file  Tobacco Use  . Smoking status: Former Smoker    Years: 10.00    Last attempt to quit: 05/04/1964    Years since quitting: 54.9  . Smokeless tobacco: Never Used  Substance and Sexual Activity  . Alcohol use: Yes    Alcohol/week: 11.0 standard drinks    Types: 7 Glasses of wine, 4 Standard drinks or equivalent per week    Comment: social  . Drug use: No  . Sexual activity: Never    Partners: Male  Lifestyle  . Physical activity:     Days per week: Not on file    Minutes per session: Not on file  . Stress: Not on file  Relationships  . Social connections:    Talks on phone: Not on file    Gets together: Not on file    Attends religious service: Not on file    Active member of club or organization: Not on file    Attends meetings of clubs or organizations: Not on file    Relationship status: Not on file  . Intimate partner violence:    Fear of current or ex partner: Not on file    Emotionally abused: Not on file    Physically abused: Not on file  Forced sexual activity: Not on file  Other Topics Concern  . Not on file  Social History Narrative  . Not on file    Outpatient Medications Prior to Visit  Medication Sig Dispense Refill  . acetaminophen (TYLENOL) 500 MG tablet Take 1,000 mg by mouth 3 (three) times daily.     . ACIDOPHILUS LACTOBACILLUS PO Take by mouth.    . Biotin 5000 MCG SUBL Place under the tongue.    . carbidopa-levodopa (SINEMET IR) 25-100 MG per tablet Take 1 tablet by mouth 4 (four) times daily.     . Carbidopa-Levodopa ER (SINEMET CR) 25-100 MG tablet controlled release     . Cholecalciferol (VITAMIN D-1000 MAX ST) 1000 units tablet Take by mouth.    Marland Kitchen Co-Enzyme Q-10 30 MG CAPS Take by mouth.    . diazepam (VALIUM) 5 MG tablet Take 1 tablet (5 mg total) by mouth every 12 (twelve) hours as needed. for anxiety (Patient taking differently: Take 5 mg by mouth every 12 (twelve) hours as needed. 1/2 tablet for anxiety) 60 tablet 1  . diclofenac (VOLTAREN) 50 MG EC tablet Take by mouth.    . estradiol (ESTRACE) 0.5 MG tablet TAKE 1/2 TABLET BY MOUTH EVERY DAY IN AM 45 tablet 4  . fluticasone (CUTIVATE) 0.05 % cream APPLY TO AFFECTED AREA TWICE A DAY  1  . hydrochlorothiazide (HYDRODIURIL) 25 MG tablet TAKE 1 AND 1/2 TABLETS BY MOUTH DAILY 135 tablet 1  . loratadine (CLARITIN) 10 MG tablet Take 10 mg by mouth daily.    . medroxyPROGESTERone (PROVERA) 2.5 MG tablet Take 0.5 tablets (1.25 mg  total) by mouth daily. 45 tablet 4  . Multiple Vitamin (MULTI-VITAMINS) TABS Take by mouth.    Vladimir Faster Glycol-Propyl Glycol (SYSTANE) 0.4-0.3 % SOLN Apply 1 drop to eye 2 (two) times daily.     . sertraline (ZOLOFT) 25 MG tablet Take 1 tablet (25 mg total) by mouth every morning. 90 tablet 1  . Turmeric 500 MG TABS Take 1,000 mg by mouth.    Marland Kitchen HYDROcodone-acetaminophen (NORCO) 5-325 MG tablet Take 1 tablet by mouth every 6 (six) hours as needed. 20 tablet 0  . senna-docusate (SENOKOT-S) 8.6-50 MG tablet Take by mouth.     No facility-administered medications prior to visit.     Allergies  Allergen Reactions  . Bee Venom Swelling    Yellow jackets, white and black face hornets, local swelling only  . Codeine Nausea And Vomiting  . Hornet Venom   . Penicillins Itching and Swelling    Has patient had a PCN reaction causing immediate rash, facial/tongue/throat swelling, SOB or lightheadedness with hypotension: Yes Has patient had a PCN reaction causing severe rash involving mucus membranes or skin necrosis: No Has patient had a PCN reaction that required hospitalization: No Has patient had a PCN reaction occurring within the last 10 years: No If all of the above answers are "NO", then may proceed with Cephalosporin use.   . Lodine [Etodolac] Rash  . Tramadol Itching and Rash    Review of Systems  Constitutional: Negative for fever and malaise/fatigue.  HENT: Negative for congestion.   Eyes: Negative for blurred vision.  Respiratory: Negative for shortness of breath.   Cardiovascular: Negative for chest pain, palpitations and leg swelling.  Gastrointestinal: Negative for abdominal pain, blood in stool and nausea.  Genitourinary: Negative for dysuria and frequency.  Musculoskeletal: Negative for falls.  Skin: Negative for rash.  Neurological: Negative for dizziness, loss of consciousness and headaches.  Endo/Heme/Allergies: Negative for environmental allergies.   Psychiatric/Behavioral: Negative for depression. The patient is not nervous/anxious.        Objective:    Physical Exam Constitutional:      Appearance: Normal appearance. She is not ill-appearing.  HENT:     Head: Normocephalic and atraumatic.  Neck:     Musculoskeletal: Neck supple.  Pulmonary:     Effort: Pulmonary effort is normal.  Skin:    General: Skin is dry.  Neurological:     General: No focal deficit present.     Mental Status: She is alert and oriented to person, place, and time.     BP 127/82   Pulse 84   Ht 5\' 2"  (1.575 m)   Wt 121 lb (54.9 kg)   LMP 01/13/2013 Comment: spotting-had benign endometrial biopsy   BMI 22.13 kg/m  Wt Readings from Last 3 Encounters:  04/02/19 121 lb (54.9 kg)  12/12/18 126 lb 1.7 oz (57.2 kg)  09/15/18 125 lb (56.7 kg)    Diabetic Foot Exam - Simple   No data filed     Lab Results  Component Value Date   WBC 6.8 03/13/2018   HGB 13.8 03/13/2018   HCT 40.5 03/13/2018   PLT 188.0 03/13/2018   GLUCOSE 148 (H) 12/08/2018   CHOL 177 03/13/2018   TRIG 103.0 03/13/2018   HDL 56.60 03/13/2018   LDLDIRECT 120.0 12/11/2014   LDLCALC 100 (H) 03/13/2018   ALT 5 03/13/2018   AST 26 03/13/2018   NA 135 12/08/2018   K 3.9 12/08/2018   CL 97 (L) 12/08/2018   CREATININE 0.81 12/08/2018   BUN 23 12/08/2018   CO2 29 12/08/2018   TSH 1.00 03/13/2018   INR 0.95 09/21/2017    Lab Results  Component Value Date   TSH 1.00 03/13/2018   Lab Results  Component Value Date   WBC 6.8 03/13/2018   HGB 13.8 03/13/2018   HCT 40.5 03/13/2018   MCV 88.0 03/13/2018   PLT 188.0 03/13/2018   Lab Results  Component Value Date   NA 135 12/08/2018   K 3.9 12/08/2018   CO2 29 12/08/2018   GLUCOSE 148 (H) 12/08/2018   BUN 23 12/08/2018   CREATININE 0.81 12/08/2018   BILITOT 0.6 03/13/2018   ALKPHOS 48 03/13/2018   AST 26 03/13/2018   ALT 5 03/13/2018   PROT 6.5 03/13/2018   ALBUMIN 4.5 03/13/2018   CALCIUM 9.7 12/08/2018    ANIONGAP 9 12/08/2018   GFR 72.60 03/13/2018   Lab Results  Component Value Date   CHOL 177 03/13/2018   Lab Results  Component Value Date   HDL 56.60 03/13/2018   Lab Results  Component Value Date   LDLCALC 100 (H) 03/13/2018   Lab Results  Component Value Date   TRIG 103.0 03/13/2018   Lab Results  Component Value Date   CHOLHDL 3 03/13/2018   No results found for: HGBA1C     Assessment & Plan:   Problem List Items Addressed This Visit    HTN (hypertension)    Well controlled, no changes to meds. Encouraged heart healthy diet such as the DASH diet and exercise as tolerated.       Hyperlipidemia    Encouraged heart healthy diet, increase exercise, avoid trans fats, consider a krill oil cap daily      Depression with anxiety    Doing well with complaints today.       Psoriatic arthritis ([place])    Has  been referred to dr Gavin Pound of Rheumatology who made the diagnosis. They are getting ready to get the patient set up on a biologic medicaiton, Simponi Aria ultimately IV dosing every other month if she tolerates it. She has significant rash on scalp as well as joint pain and swelling so they are hoping this will help both.          I have discontinued Kelsea Mousel. Stenzel's senna-docusate and HYDROcodone-acetaminophen. I am also having her maintain her carbidopa-levodopa, acetaminophen, Polyethyl Glycol-Propyl Glycol, loratadine, ACIDOPHILUS LACTOBACILLUS PO, Biotin, Cholecalciferol, Co-Enzyme Q-10, diclofenac, Multi-Vitamins, fluticasone, estradiol, medroxyPROGESTERone, Carbidopa-Levodopa ER, sertraline, Turmeric, diazepam, and hydrochlorothiazide.  No orders of the defined types were placed in this encounter.    I discussed the assessment and treatment plan with the patient. The patient was provided an opportunity to ask questions and all were answered. The patient agreed with the plan and demonstrated an understanding of the instructions.   The patient was  advised to call back or seek an in-person evaluation if the symptoms worsen or if the condition fails to improve as anticipated.  I provided 25 minutes of non-face-to-face time during this encounter.   Penni Homans, MD

## 2019-04-04 DIAGNOSIS — M1812 Unilateral primary osteoarthritis of first carpometacarpal joint, left hand: Secondary | ICD-10-CM | POA: Diagnosis not present

## 2019-04-04 DIAGNOSIS — S66812A Strain of other specified muscles, fascia and tendons at wrist and hand level, left hand, initial encounter: Secondary | ICD-10-CM | POA: Diagnosis not present

## 2019-04-05 DIAGNOSIS — L4050 Arthropathic psoriasis, unspecified: Secondary | ICD-10-CM | POA: Diagnosis not present

## 2019-04-06 DIAGNOSIS — M25532 Pain in left wrist: Secondary | ICD-10-CM | POA: Diagnosis not present

## 2019-04-06 DIAGNOSIS — M79645 Pain in left finger(s): Secondary | ICD-10-CM | POA: Diagnosis not present

## 2019-04-06 DIAGNOSIS — M1812 Unilateral primary osteoarthritis of first carpometacarpal joint, left hand: Secondary | ICD-10-CM | POA: Diagnosis not present

## 2019-04-06 DIAGNOSIS — S66812A Strain of other specified muscles, fascia and tendons at wrist and hand level, left hand, initial encounter: Secondary | ICD-10-CM | POA: Diagnosis not present

## 2019-04-06 DIAGNOSIS — M25642 Stiffness of left hand, not elsewhere classified: Secondary | ICD-10-CM | POA: Diagnosis not present

## 2019-04-18 DIAGNOSIS — M1812 Unilateral primary osteoarthritis of first carpometacarpal joint, left hand: Secondary | ICD-10-CM | POA: Diagnosis not present

## 2019-04-18 DIAGNOSIS — M25532 Pain in left wrist: Secondary | ICD-10-CM | POA: Diagnosis not present

## 2019-04-18 DIAGNOSIS — S66812A Strain of other specified muscles, fascia and tendons at wrist and hand level, left hand, initial encounter: Secondary | ICD-10-CM | POA: Diagnosis not present

## 2019-04-18 DIAGNOSIS — M25642 Stiffness of left hand, not elsewhere classified: Secondary | ICD-10-CM | POA: Diagnosis not present

## 2019-04-18 DIAGNOSIS — M79645 Pain in left finger(s): Secondary | ICD-10-CM | POA: Diagnosis not present

## 2019-04-23 DIAGNOSIS — R310 Gross hematuria: Secondary | ICD-10-CM | POA: Diagnosis not present

## 2019-04-23 DIAGNOSIS — C641 Malignant neoplasm of right kidney, except renal pelvis: Secondary | ICD-10-CM | POA: Diagnosis not present

## 2019-04-23 DIAGNOSIS — C672 Malignant neoplasm of lateral wall of bladder: Secondary | ICD-10-CM | POA: Diagnosis not present

## 2019-04-27 DIAGNOSIS — Z1231 Encounter for screening mammogram for malignant neoplasm of breast: Secondary | ICD-10-CM | POA: Diagnosis not present

## 2019-04-27 LAB — HM MAMMOGRAPHY

## 2019-04-30 DIAGNOSIS — M79645 Pain in left finger(s): Secondary | ICD-10-CM | POA: Diagnosis not present

## 2019-04-30 DIAGNOSIS — M1812 Unilateral primary osteoarthritis of first carpometacarpal joint, left hand: Secondary | ICD-10-CM | POA: Diagnosis not present

## 2019-04-30 DIAGNOSIS — M25642 Stiffness of left hand, not elsewhere classified: Secondary | ICD-10-CM | POA: Diagnosis not present

## 2019-04-30 DIAGNOSIS — S66812A Strain of other specified muscles, fascia and tendons at wrist and hand level, left hand, initial encounter: Secondary | ICD-10-CM | POA: Diagnosis not present

## 2019-04-30 DIAGNOSIS — M25532 Pain in left wrist: Secondary | ICD-10-CM | POA: Diagnosis not present

## 2019-05-01 DIAGNOSIS — G20 Parkinson's disease: Secondary | ICD-10-CM | POA: Diagnosis not present

## 2019-05-03 DIAGNOSIS — L4050 Arthropathic psoriasis, unspecified: Secondary | ICD-10-CM | POA: Diagnosis not present

## 2019-05-15 DIAGNOSIS — S66812A Strain of other specified muscles, fascia and tendons at wrist and hand level, left hand, initial encounter: Secondary | ICD-10-CM | POA: Diagnosis not present

## 2019-05-15 DIAGNOSIS — M79645 Pain in left finger(s): Secondary | ICD-10-CM | POA: Diagnosis not present

## 2019-05-15 DIAGNOSIS — M25642 Stiffness of left hand, not elsewhere classified: Secondary | ICD-10-CM | POA: Diagnosis not present

## 2019-05-15 DIAGNOSIS — M1812 Unilateral primary osteoarthritis of first carpometacarpal joint, left hand: Secondary | ICD-10-CM | POA: Diagnosis not present

## 2019-05-15 DIAGNOSIS — M25532 Pain in left wrist: Secondary | ICD-10-CM | POA: Diagnosis not present

## 2019-05-23 DIAGNOSIS — M1812 Unilateral primary osteoarthritis of first carpometacarpal joint, left hand: Secondary | ICD-10-CM | POA: Diagnosis not present

## 2019-05-23 DIAGNOSIS — S66812A Strain of other specified muscles, fascia and tendons at wrist and hand level, left hand, initial encounter: Secondary | ICD-10-CM | POA: Diagnosis not present

## 2019-06-04 ENCOUNTER — Ambulatory Visit: Payer: Medicare Other | Admitting: Family Medicine

## 2019-06-04 ENCOUNTER — Other Ambulatory Visit: Payer: Self-pay

## 2019-06-04 NOTE — Assessment & Plan Note (Signed)
Encouraged to check vitals weekly, no changes to meds. Encouraged heart healthy diet such as the DASH diet and exercise as tolerated.

## 2019-06-04 NOTE — Assessment & Plan Note (Signed)
Encouraged heart healthy diet, increase exercise, avoid trans fats, consider a krill oil cap daily 

## 2019-06-05 ENCOUNTER — Other Ambulatory Visit: Payer: Self-pay | Admitting: Family Medicine

## 2019-06-05 NOTE — Progress Notes (Signed)
No visit

## 2019-06-07 ENCOUNTER — Encounter: Payer: Self-pay | Admitting: Family Medicine

## 2019-06-07 DIAGNOSIS — M25561 Pain in right knee: Secondary | ICD-10-CM | POA: Diagnosis not present

## 2019-06-08 ENCOUNTER — Other Ambulatory Visit: Payer: Self-pay

## 2019-06-08 ENCOUNTER — Ambulatory Visit (INDEPENDENT_AMBULATORY_CARE_PROVIDER_SITE_OTHER): Payer: Medicare Other | Admitting: Family Medicine

## 2019-06-08 ENCOUNTER — Encounter: Payer: Self-pay | Admitting: Family Medicine

## 2019-06-08 DIAGNOSIS — I10 Essential (primary) hypertension: Secondary | ICD-10-CM | POA: Diagnosis not present

## 2019-06-08 DIAGNOSIS — B354 Tinea corporis: Secondary | ICD-10-CM | POA: Diagnosis not present

## 2019-06-08 DIAGNOSIS — E7800 Pure hypercholesterolemia, unspecified: Secondary | ICD-10-CM | POA: Diagnosis not present

## 2019-06-08 DIAGNOSIS — M25561 Pain in right knee: Secondary | ICD-10-CM | POA: Diagnosis not present

## 2019-06-08 DIAGNOSIS — G8929 Other chronic pain: Secondary | ICD-10-CM

## 2019-06-08 DIAGNOSIS — L4050 Arthropathic psoriasis, unspecified: Secondary | ICD-10-CM

## 2019-06-08 NOTE — Assessment & Plan Note (Signed)
Clotrimazole bid for next month, rash on left side of neck

## 2019-06-08 NOTE — Patient Instructions (Addendum)
Clotrimazole cream, twice daily to neck Body Ringworm Body ringworm is an infection of the skin that often causes a ring-shaped rash. Body ringworm is also called tinea corporis. Body ringworm can affect any part of your skin. This condition is easily spread from person to person (is very contagious). What are the causes? This condition is caused by fungi called dermatophytes. The condition develops when these fungi grow out of control on the skin. You can get this condition if you touch a person or animal that has it. You can also get it if you share any items with an infected person or pet. These include:  Clothing, bedding, and towels.  Brushes or combs.  Gym equipment.  Any other object that has the fungus on it. What increases the risk? You are more likely to develop this condition if you:  Play sports that involve close physical contact, such as wrestling.  Sweat a lot.  Live in areas that are hot and humid.  Use public showers.  Have a weakened immune system. What are the signs or symptoms? Symptoms of this condition include:  Itchy, raised red spots and bumps.  Red scaly patches.  A ring-shaped rash. The rash may have: ? A clear center. ? Scales or red bumps at its center. ? Redness near its borders. ? Dry and scaly skin on or around it. How is this diagnosed? This condition can usually be diagnosed with a skin exam. A skin scraping may be taken from the affected area and examined under a microscope to see if the fungus is present. How is this treated? This condition may be treated with:  An antifungal cream or ointment.  An antifungal shampoo.  Antifungal medicines. These may be prescribed if your ringworm: ? Is severe. ? Keeps coming back. ? Lasts a long time. Follow these instructions at home:  Take over-the-counter and prescription medicines only as told by your health care provider.  If you were given an antifungal cream or ointment: ? Use it as  told by your health care provider. ? Wash the infected area and dry it completely before applying the cream or ointment.  If you were given an antifungal shampoo: ? Use it as told by your health care provider. ? Leave the shampoo on your body for 3-5 minutes before rinsing.  While you have a rash: ? Wear loose clothing to stop clothes from rubbing and irritating it. ? Wash or change your bed sheets every night. ? Disinfect or throw out items that may be infected. ? Wash clothes and bed sheets in hot water. ? Wash your hands often with soap and water. If soap and water are not available, use hand sanitizer.  If your pet has the same infection, take your pet to see a veterinarian for treatment. How is this prevented?  Take a bath or shower every day and after every time you work out or play sports.  Dry your skin completely after bathing.  Wear sandals or shoes in public places and showers.  Change your clothes every day.  Wash athletic clothes after each use.  Do not share personal items with others.  Avoid touching red patches of skin on other people.  Avoid touching pets that have bald spots.  If you touch an animal that has a bald spot, wash your hands. Contact a health care provider if:  Your rash continues to spread after 7 days of treatment.  Your rash is not gone in 4 weeks.  The area  around your rash gets red, warm, tender, and swollen. Summary  Body ringworm is an infection of the skin that often causes a ring-shaped rash.  This condition is easily spread from person to person (is very contagious).  This condition may be treated with antifungal cream or ointment, antifungal shampoo, or antifungal medicines.  Take over-the-counter and prescription medicines only as told by your health care provider. This information is not intended to replace advice given to you by your health care provider. Make sure you discuss any questions you have with your health care  provider. Document Released: 10/29/2000 Document Revised: 06/30/2018 Document Reviewed: 06/30/2018 Elsevier Patient Education  2020 [employer].

## 2019-06-08 NOTE — Assessment & Plan Note (Signed)
Well controlled, no changes to meds. Encouraged heart healthy diet such as the DASH diet and exercise as tolerated.  °

## 2019-06-10 NOTE — Assessment & Plan Note (Signed)
Encouraged heart healthy diet, increase exercise, avoid trans fats, consider a krill oil cap daily 

## 2019-06-10 NOTE — Progress Notes (Signed)
Subjective:    Patient ID: Stephanie [name], female    DOB: 28-Jun-1932, 85 y.o.   MRN: [id number]  No chief complaint on file.   HPI Patient is in today for follow-up on chronic medical concerns including her psoriatic arthritis, hypertension, hyperlipidemia and more.  She is following with Dr. Janalyn Harder of rheumatology and they have her currently treated on Simponi.  She had to have surgery in her left wrist with Dr. Fredna Dow for 2 rep retention secondary to her psoriatic arthritis and she is healing slowly.  Her right knee pain is being managed by Dr. Ricki Rodriguez.  No recent febrile illness or hospitalizations.  She is maintaining quarantine for the most part.  She is trying to stay as active as possible and maintain a heart healthy diet. Denies CP/palp/SOB/HA/congestion/fevers/GI or GU c/o. Taking meds as prescribed  Past Medical History:  Diagnosis Date  . Arthropathy of cervical spine 11/27/2012   Right  Xray at [facility]  On 04/08/2016  AP, lateral, and lateral flexion and extension views of the cervical spine are submitted for evaluation.   No acute fracture identified in the cervical spine.   There is redemonstration of approximately 2 mm of C3 on C4 anterolisthesis in neutral which slightly increases in flexion relative to neutral and extension. Slight C5 on C6 retrolisthesis does not change i  . Benign hypertension without congestive heart failure   . Benign mole    right hcets mole, bleeds when dried with towel  . Bilateral dry eyes   . Bladder cancer ([place])   . Bladder cancer ([place]) 10/17/2016  . Cervical spondylosis   . Cervicalgia   . Depression with anxiety 10/17/2016  . Gross hematuria   . History of kidney stones    1970's  . Lumbar stenosis   . Malignant tumor of urinary bladder ([place]) 07/2016   Pre-stage I  . OA (osteoarthritis)   . Parkinson disease [facility]) dx 2012   neurologist-  dr siddiqui at [facility]    Past Surgical History:  Procedure Laterality Date  . BIOPSY MASS LEFT FIRST  METACARPAL   06/23/2006   benign  . CATARACT EXTRACTION W/ INTRAOCULAR LENS  IMPLANT, BILATERAL  1999 and 2003  . CERVICAL FUSION  02/2017   c1-c2 ; reports she has 2 screws 2in long in place done at [facility] ; patient exhibits VERY LIMITED NECK ROM  . CYSTOSCOPY W/ RETROGRADES Bilateral 09/22/2016   Procedure: CYSTOSCOPY WITH RETROGRADE PYELOGRAM;  Surgeon: Alexis Frock, MD;  Location: [facility];  Service: Urology;  Laterality: Bilateral;  . D & C HYSTEROSCOPY W/ RESECTION POLYP  07/11/2000  . DILATION AND CURETTAGE OF UTERUS  1960's  . EXCISION CYST AND DEBRIDEMENT RIGHT WIRST AND REMOVAL [place] BODY  01/09/2009  . FLEXOR TENDON REPAIR Left 12/12/2018   Procedure: REPAIR/TRANSFER FLEXOR DIGITORUM PROFUNDUS OF LEFT SMALL FINGER;  Surgeon: Daryll Brod, MD;  Location: [place];  Service: Orthopedics;  Laterality: Left;  . LAPAROSCOPY  yrs ago   infertility   . METACARPOPHALANGEAL JOINT ARTHRODESIS Right 05/25/1996  . TONSILLECTOMY AND ADENOIDECTOMY  child  . TOTAL HIP ARTHROPLASTY Left 07/07/2016   Procedure: LEFT TOTAL HIP ARTHROPLASTY ANTERIOR APPROACH;  Surgeon: Gaynelle Arabian, MD;  Location[facility];  Service: Orthopedics;  Laterality: Left;  . TOTAL HIP ARTHROPLASTY Right 09/28/2017   Procedure: RIGHT TOTAL HIP ARTHROPLASTY ANTERIOR APPROACH;  Surgeon: Gaynelle Arabian, MD;  [facility];  Service: Orthopedics;  Laterality: Right;  . TRANSURETHRAL RESECTION  OF BLADDER TUMOR N/A 09/22/2016   Procedure: TRANSURETHRAL RESECTION OF BLADDER TUMOR (TURBT);  Surgeon: Alexis Frock, MD;  Location: [facility];  Service: Urology;  Laterality: N/A;    Family History  Problem Relation Age of Onset  . Arthritis Mother   . Hypertension Mother   . Deep vein thrombosis Mother        recurrent, secondary to Bleeding disorder  . Arthritis Father   . Stroke Father   . Diabetes Sister   . Heart disease Sister   . Obesity Sister   .  Arthritis Sister   . Hypertension Sister   . Heart disease Maternal Grandmother   . Heart disease Maternal Grandfather   . Peripheral vascular disease Paternal Grandmother        s/p leg amputation  . Stroke Paternal Grandfather     Social History   Socioeconomic History  . Marital status: Widowed    Spouse name: Not on file  . Number of children: Not on file  . Years of education: Not on file  . Highest education level: Not on file  Occupational History  . Not on file  Social Needs  . Financial resource strain: Not on file  . Food insecurity    Worry: Not on file    Inability: Not on file  . Transportation needs    Medical: Not on file    Non-medical: Not on file  Tobacco Use  . Smoking status: Former Smoker    Years: 10.00    Quit date: 05/04/1964    Years since quitting: 55.1  . Smokeless tobacco: Never Used  Substance and Sexual Activity  . Alcohol use: Yes    Alcohol/week: 11.0 standard drinks    Types: 7 Glasses of wine, 4 Standard drinks or equivalent per week    Comment: social  . Drug use: No  . Sexual activity: Never    Partners: Male  Lifestyle  . Physical activity    Days per week: Not on file    Minutes per session: Not on file  . Stress: Not on file  Relationships  . Social Herbalist on phone: Not on file    Gets together: Not on file    Attends religious service: Not on file    Active member of club or organization: Not on file    Attends meetings of clubs or organizations: Not on file    Relationship status: Not on file  . Intimate partner violence    Fear of current or ex partner: Not on file    Emotionally abused: Not on file    Physically abused: Not on file    Forced sexual activity: Not on file  Other Topics Concern  . Not on file  Social History Narrative  . Not on file    Outpatient Medications Prior to Visit  Medication Sig Dispense Refill  . acetaminophen (TYLENOL) 500 MG tablet Take 1,000 mg by mouth 3 (three)  times daily.     . ACIDOPHILUS LACTOBACILLUS PO Take by mouth.    . Biotin 5000 MCG SUBL Place under the tongue.    . carbidopa-levodopa (SINEMET IR) 25-100 MG per tablet Take 1 tablet by mouth 4 (four) times daily.     . Carbidopa-Levodopa ER (SINEMET CR) 25-100 MG tablet controlled release     . Cholecalciferol (VITAMIN D-1000 MAX ST) 1000 units tablet Take by mouth.    Marland Kitchen Co-Enzyme Q-10 30 MG CAPS Take by mouth.    Marland Kitchen  diazepam (VALIUM) 5 MG tablet Take 1 tablet (5 mg total) by mouth every 12 (twelve) hours as needed. for anxiety (Patient taking differently: Take 5 mg by mouth every 12 (twelve) hours as needed. 1/2 tablet for anxiety) 60 tablet 1  . diclofenac (VOLTAREN) 50 MG EC tablet Take by mouth.    . estradiol (ESTRACE) 0.5 MG tablet TAKE 1/2 TABLET BY MOUTH EVERY DAY IN AM 45 tablet 4  . fluticasone (CUTIVATE) 0.05 % cream APPLY TO AFFECTED AREA TWICE A DAY  1  . hydrochlorothiazide (HYDRODIURIL) 25 MG tablet TAKE 1 AND 1/2 TABLETS BY MOUTH DAILY 135 tablet 1  . loratadine (CLARITIN) 10 MG tablet Take 10 mg by mouth daily.    . medroxyPROGESTERone (PROVERA) 2.5 MG tablet Take 0.5 tablets (1.25 mg total) by mouth daily. 45 tablet 4  . Multiple Vitamin (MULTI-VITAMINS) TABS Take by mouth.    Vladimir Faster Glycol-Propyl Glycol (SYSTANE) 0.4-0.3 % SOLN Apply 1 drop to eye 2 (two) times daily.     . sertraline (ZOLOFT) 25 MG tablet TAKE 1 TABLET BY MOUTH EVERY DAY IN THE MORNING 90 tablet 1  . Turmeric 500 MG TABS Take 1,000 mg by mouth.     No facility-administered medications prior to visit.     Allergies  Allergen Reactions  . Bee Venom Swelling    Yellow jackets, white and black face hornets, local swelling only  . Codeine Nausea And Vomiting  . Hornet Venom   . Penicillins Itching and Swelling    Has patient had a PCN reaction causing immediate rash, facial/tongue/throat swelling, SOB or lightheadedness with hypotension: Yes Has patient had a PCN reaction causing severe rash  involving mucus membranes or skin necrosis: No Has patient had a PCN reaction that required hospitalization: No Has patient had a PCN reaction occurring within the last 10 years: No If all of the above answers are "NO", then may proceed with Cephalosporin use.   . Lodine [Etodolac] Rash  . Tramadol Itching and Rash    Review of Systems  Constitutional: Negative for fever and malaise/fatigue.  HENT: Negative for congestion.   Eyes: Negative for blurred vision.  Respiratory: Negative for shortness of breath.   Cardiovascular: Negative for chest pain, palpitations and leg swelling.  Gastrointestinal: Negative for abdominal pain, blood in stool and nausea.  Genitourinary: Negative for dysuria and frequency.  Musculoskeletal: Positive for joint pain. Negative for falls.  Skin: Negative for rash.  Neurological: Negative for dizziness, loss of consciousness and headaches.  Endo/Heme/Allergies: Negative for environmental allergies.  Psychiatric/Behavioral: Negative for depression. The patient is not nervous/anxious.        Objective:    Physical Exam  BP 140/78 (BP Location: Left Arm, Patient Position: Sitting, Cuff Size: Normal)   Pulse 85   Temp 98.3 F (36.8 C) (Oral)   Resp 18   Wt 125 lb 12.8 oz (57.1 kg)   LMP 01/13/2013 Comment: spotting-had benign endometrial biopsy   SpO2 94%   BMI 23.01 kg/m  Wt Readings from Last 3 Encounters:  06/08/19 125 lb 12.8 oz (57.1 kg)  04/02/19 121 lb (54.9 kg)  12/12/18 126 lb 1.7 oz (57.2 kg)    Diabetic Foot Exam - Simple   No data filed     Lab Results  Component Value Date   WBC 6.8 03/13/2018   HGB 13.8 03/13/2018   HCT 40.5 03/13/2018   PLT 188.0 03/13/2018   GLUCOSE 148 (H) 12/08/2018   CHOL 177 03/13/2018   TRIG  103.0 03/13/2018   HDL 56.60 03/13/2018   LDLDIRECT 120.0 12/11/2014   LDLCALC 100 (H) 03/13/2018   ALT 5 03/13/2018   AST 26 03/13/2018   NA 135 12/08/2018   K 3.9 12/08/2018   CL 97 (L) 12/08/2018    CREATININE 0.81 12/08/2018   BUN 23 12/08/2018   CO2 29 12/08/2018   TSH 1.00 03/13/2018   INR 0.95 09/21/2017    Lab Results  Component Value Date   TSH 1.00 03/13/2018   Lab Results  Component Value Date   WBC 6.8 03/13/2018   HGB 13.8 03/13/2018   HCT 40.5 03/13/2018   MCV 88.0 03/13/2018   PLT 188.0 03/13/2018   Lab Results  Component Value Date   NA 135 12/08/2018   K 3.9 12/08/2018   CO2 29 12/08/2018   GLUCOSE 148 (H) 12/08/2018   BUN 23 12/08/2018   CREATININE 0.81 12/08/2018   BILITOT 0.6 03/13/2018   ALKPHOS 48 03/13/2018   AST 26 03/13/2018   ALT 5 03/13/2018   PROT 6.5 03/13/2018   ALBUMIN 4.5 03/13/2018   CALCIUM 9.7 12/08/2018   ANIONGAP 9 12/08/2018   GFR 72.60 03/13/2018   Lab Results  Component Value Date   CHOL 177 03/13/2018   Lab Results  Component Value Date   HDL 56.60 03/13/2018   Lab Results  Component Value Date   LDLCALC 100 (H) 03/13/2018   Lab Results  Component Value Date   TRIG 103.0 03/13/2018   Lab Results  Component Value Date   CHOLHDL 3 03/13/2018   No results found for: HGBA1C     Assessment & Plan:   Problem List Items Addressed This Visit    HTN (hypertension)    Well controlled, no changes to meds. Encouraged heart healthy diet such as the DASH diet and exercise as tolerated.       Right knee pain    R TKR Dr Maureen Ralphs      Hyperlipidemia    Encouraged heart healthy diet, increase exercise, avoid trans fats, consider a krill oil cap daily      Psoriatic arthritis ([place])    Is following with Dr Trudie Reed at [place], is now on [place]. She brings in labs for review that they ran recently.       Tinea corporis    Clotrimazole bid for next month, rash on left side of neck         I am having Armanda Magic. Skousen maintain her carbidopa-levodopa, acetaminophen, Polyethyl Glycol-Propyl Glycol, loratadine, ACIDOPHILUS LACTOBACILLUS PO, Biotin, Cholecalciferol, Co-Enzyme Q-10, diclofenac, Multi-Vitamins,  fluticasone, estradiol, medroxyPROGESTERone, Carbidopa-Levodopa ER, Turmeric, diazepam, hydrochlorothiazide, and sertraline.  No orders of the defined types were placed in this encounter.    Penni Homans, MD

## 2019-06-10 NOTE — Assessment & Plan Note (Signed)
R TKR Dr Maureen Ralphs

## 2019-06-10 NOTE — Assessment & Plan Note (Signed)
Is following with Dr Trudie Reed at [place], is now on [place]. She brings in labs for review that they ran recently.

## 2019-06-28 DIAGNOSIS — L4050 Arthropathic psoriasis, unspecified: Secondary | ICD-10-CM | POA: Diagnosis not present

## 2019-06-29 DIAGNOSIS — Z23 Encounter for immunization: Secondary | ICD-10-CM | POA: Diagnosis not present

## 2019-06-30 ENCOUNTER — Encounter: Payer: Self-pay | Admitting: Family Medicine

## 2019-07-02 DIAGNOSIS — Z79899 Other long term (current) drug therapy: Secondary | ICD-10-CM | POA: Diagnosis not present

## 2019-07-02 DIAGNOSIS — L409 Psoriasis, unspecified: Secondary | ICD-10-CM | POA: Diagnosis not present

## 2019-07-02 DIAGNOSIS — M2550 Pain in unspecified joint: Secondary | ICD-10-CM | POA: Diagnosis not present

## 2019-07-02 DIAGNOSIS — M150 Primary generalized (osteo)arthritis: Secondary | ICD-10-CM | POA: Diagnosis not present

## 2019-07-02 DIAGNOSIS — L4050 Arthropathic psoriasis, unspecified: Secondary | ICD-10-CM | POA: Diagnosis not present

## 2019-07-13 ENCOUNTER — Other Ambulatory Visit: Payer: Self-pay

## 2019-07-16 ENCOUNTER — Encounter: Payer: Self-pay | Admitting: Obstetrics & Gynecology

## 2019-07-16 ENCOUNTER — Other Ambulatory Visit: Payer: Self-pay

## 2019-07-16 ENCOUNTER — Ambulatory Visit (INDEPENDENT_AMBULATORY_CARE_PROVIDER_SITE_OTHER): Payer: Medicare Other | Admitting: Obstetrics & Gynecology

## 2019-07-16 VITALS — BP 140/90 | HR 92 | Temp 97.1°F | Ht 62.75 in | Wt 124.0 lb

## 2019-07-16 DIAGNOSIS — Z01419 Encounter for gynecological examination (general) (routine) without abnormal findings: Secondary | ICD-10-CM

## 2019-07-16 DIAGNOSIS — Z124 Encounter for screening for malignant neoplasm of cervix: Secondary | ICD-10-CM | POA: Diagnosis not present

## 2019-07-16 MED ORDER — MEDROXYPROGESTERONE ACETATE 2.5 MG PO TABS: 1.2500 mg | ORAL_TABLET | Freq: Every day | ORAL | 4 refills | Status: DC

## 2019-07-16 MED ORDER — ESTRADIOL 0.5 MG PO TABS: ORAL_TABLET | ORAL | 4 refills | Status: DC

## 2019-07-16 NOTE — Addendum Note (Signed)
Addended by: Megan Salon on: 07/16/2019 11:13 AM   Modules accepted: Orders

## 2019-07-16 NOTE — Progress Notes (Addendum)
85 y.o. G2P1 Widowed White or Caucasian female here for annual exam.  Having right knee replacement due to fluid around right knee.  Has been seeing Dr. Maureen Ralphs.  This is scheduled for 07/30/2019.    Denies vaginal bleeding.    Already had her flu shot.    Takes a very small dosage of estradiol.  Desires to continue.  She is aware of DVT risk esp with surgery.    Patient's last menstrual period was 01/13/2013.          Sexually active: No.  The current method of family planning is post menopausal status.    Exercising: Yes.    walking Smoker:  no  Health Maintenance: Pap:  02/18/17 neg  History of abnormal Pap:  yes MMG:  04/27/19 BIRADS1:neg  Colonoscopy:  2004 no f/u needed  BMD:   03/08/16 normal  TDaP:  2015 Pneumonia vaccine(s):  2016 Shingrix:   Completed Hep C testing: n/a Screening Labs: PCP   reports that she quit smoking about 55 years ago. She quit after 10.00 years of use. She has never used smokeless tobacco. She reports current alcohol use of about 7.0 standard drinks of alcohol per week. She reports that she does not use drugs.  Past Medical History:  Diagnosis Date  . Arthropathy of cervical spine 11/27/2012   Right  [facility]ray at [facility]  On 04/08/2016  AP, lateral, and lateral flexion and extension views of the cervical spine are submitted for evaluation.   No acute fracture identified in the cervical spine.   There is redemonstration of approximately 2 mm of C3 on C4 anterolisthesis in neutral which slightly increases in flexion relative to neutral and extension. Slight C5 on C6 retrolisthesis does not change i  . Benign hypertension without congestive heart failure   . Benign mole    right hcets mole, bleeds when dried with towel  . Bilateral dry eyes   . Bladder cancer ([place])   . Bladder cancer ([place]) 10/17/2016  . Cervical spondylosis   . Cervicalgia   . Depression with anxiety 10/17/2016  . Gross hematuria   . History of kidney stones    1970's  . Lumbar stenosis   .  Malignant tumor of urinary bladder ([place]) 07/2016   Pre-stage I  . OA (osteoarthritis)   . Parkinson disease [facility]) dx 2012   neurologist-  dr siddiqui at [facility]  . Psoriatic arthritis [facility])   . Rupture of flexor tendon of hand 2020   right    Past Surgical History:  Procedure Laterality Date  . BIOPSY MASS LEFT FIRST METACARPAL   06/23/2006   benign  . CATARACT EXTRACTION W/ INTRAOCULAR LENS  IMPLANT, BILATERAL  1999 and 2003  . CERVICAL FUSION  02/2017   c1-c2 ; reports she has 2 screws 2in long in place done at [facility] ; patient exhibits VERY LIMITED NECK ROM  . CYSTOSCOPY W/ RETROGRADES Bilateral 09/22/2016   Procedure: CYSTOSCOPY WITH RETROGRADE PYELOGRAM;  Surgeon: Alexis Frock, MD;  Location: [facility];  Service: Urology;  Laterality: Bilateral;  . D & C HYSTEROSCOPY W/ RESECTION POLYP  07/11/2000  . DILATION AND CURETTAGE OF UTERUS  1960's  . EXCISION CYST AND DEBRIDEMENT RIGHT WIRST AND REMOVAL [place] BODY  01/09/2009  . FLEXOR TENDON REPAIR Left 12/12/2018   Procedure: REPAIR/TRANSFER FLEXOR DIGITORUM PROFUNDUS OF LEFT SMALL FINGER;  Surgeon: Daryll Brod, MD;  Location: [place];  Service: Orthopedics;  Laterality: Left;  . LAPAROSCOPY  yrs ago   infertility   . METACARPOPHALANGEAL JOINT ARTHRODESIS Right 05/25/1996  . TENDON REPAIR Left 01/15/2019   Hand  . TONSILLECTOMY AND ADENOIDECTOMY  child  . TOTAL HIP ARTHROPLASTY Left 07/07/2016   Procedure: LEFT TOTAL HIP ARTHROPLASTY ANTERIOR APPROACH;  Surgeon: Gaynelle Arabian, MD;  [facility];  Service: Orthopedics;  Laterality: Left;  . TOTAL HIP ARTHROPLASTY Right 09/28/2017   Procedure: RIGHT TOTAL HIP ARTHROPLASTY ANTERIOR APPROACH;  Surgeon: Gaynelle Arabian, MD;  [facility];  Service: Orthopedics;  Laterality: Right;  . TRANSURETHRAL RESECTION OF BLADDER TUMOR N/A 09/22/2016   Procedure: TRANSURETHRAL RESECTION OF BLADDER TUMOR (TURBT);  Surgeon: Alexis Frock, MD;   Location: [facility];  Service: Urology;  Laterality: N/A;    Current Outpatient Medications  Medication Sig Dispense Refill  . ACIDOPHILUS LACTOBACILLUS PO Take by mouth.    . Ascorbic Acid (VITAMIN C) 500 MG CAPS     . carbidopa-levodopa (SINEMET IR) 25-100 MG per tablet Take 1 tablet by mouth 4 (four) times daily.     . Cholecalciferol (VITAMIN D-1000 MAX ST) 1000 units tablet Take by mouth.    . clobetasol (TEMOVATE) 0.05 % external solution clobetasol 0.05 % scalp solution    . Co-Enzyme Q-10 30 MG CAPS Take by mouth.    . diclofenac (VOLTAREN) 50 MG EC tablet Take by mouth.    . estradiol (ESTRACE) 0.5 MG tablet TAKE 1/2 TABLET BY MOUTH EVERY DAY IN AM 45 tablet 4  . fluticasone (CUTIVATE) 0.05 % cream APPLY TO AFFECTED AREA TWICE A DAY  1  . golimumab (SIMPONI ARIA) 50 MG/4ML SOLN injection Inject into the vein once.    . hydrochlorothiazide (HYDRODIURIL) 25 MG tablet TAKE 1 AND 1/2 TABLETS BY MOUTH DAILY 135 tablet 1  . medroxyPROGESTERone (PROVERA) 2.5 MG tablet Take 0.5 tablets (1.25 mg total) by mouth daily. 45 tablet 4  . Multiple Vitamin (MULTI-VITAMINS) TABS Take by mouth.    Vladimir Faster Glycol-Propyl Glycol (SYSTANE) 0.4-0.3 % SOLN Apply 1 drop to eye 2 (two) times daily.     . sertraline (ZOLOFT) 25 MG tablet TAKE 1 TABLET BY MOUTH EVERY DAY IN THE MORNING 90 tablet 1   No current facility-administered medications for this visit.     Family History  Problem Relation Age of Onset  . Arthritis Mother   . Hypertension Mother   . Deep vein thrombosis Mother        recurrent, secondary to Bleeding disorder  . Arthritis Father   . Stroke Father   . Diabetes Sister   . Heart disease Sister   . Obesity Sister   . Arthritis Sister   . Hypertension Sister   . Heart disease Maternal Grandmother   . Heart disease Maternal Grandfather   . Peripheral vascular disease Paternal Grandmother        s/p leg amputation  . Stroke Paternal Grandfather     Review  of Systems  All other systems reviewed and are negative.   Exam:   BP 140/90   Pulse 92   Temp (!) 97.1 F (36.2 C) (Temporal)   Ht 5' 2.75" (1.594 m)   Wt 124 lb (56.2 kg)   LMP 01/13/2013 Comment: spotting-had benign endometrial biopsy   BMI 22.14 kg/m      Height: 5' 2.75" (159.4 cm)  Ht Readings from Last 3 Encounters:  07/16/19 5' 2.75" (1.594 m)  04/02/19 5\' 2"  (1.575 m)  12/12/18 5\' 2"  (1.575 m)    General  appearance: alert, cooperative and appears stated age Head: Normocephalic, without obvious abnormality, atraumatic Neck: no adenopathy, supple, symmetrical, trachea midline and thyroid normal to inspection and palpation Lungs: clear to auscultation bilaterally Breasts: normal appearance, no masses or tenderness Heart: regular rate and rhythm Abdomen: soft, non-tender; bowel sounds normal; no masses,  no organomegaly Extremities: extremities normal, atraumatic, no cyanosis or edema Skin: Skin color, texture, turgor normal. No rashes or lesions Lymph nodes: Cervical, supraclavicular, and axillary nodes normal. No abnormal inguinal nodes palpated Neurologic: Grossly normal   Pelvic: External genitalia:  no lesions              Urethra:  normal appearing urethra with no masses, tenderness or lesions              Bartholins and Skenes: normal                 Vagina: normal appearing vagina with normal color and discharge, no lesions              Cervix: no lesions              Pap taken: No. Bimanual Exam:  Uterus:  normal size, contour, position, consistency, mobility, non-tender              Adnexa: normal adnexa and no mass, fullness, tenderness               Rectovaginal: Confirms               Anus:  normal sphincter tone, no lesions  Chaperone was present for exam.  A:  Well Woman with normal exam PMP, on low dosed HRT (does not desire to stop) Hypertension Parkinson's d/o, followed at [facility] H/O depression Bladder cancer followed by Dr. Tresa Moore  P:    Mammogram guidelines reviewed.  This is UTD. pap smear not indicated. RF for estradiol 0.5mg  1/2 tab daily and Provera 2.5mg  1/2 tab daily.  She is going to stop this for her knee surgery and will stay off at least two weeks.  She will check with Dr. Maureen Ralphs about whether he would like her stay off this longer.  She will start back on this every other day when she resumes.  Risks of DVT, PE, stroke, MI, breast cancer all reviewed again today. Lab work done Dr. Randel Pigg.   Colonoscopy not indicated Return annually or prn

## 2019-07-17 ENCOUNTER — Other Ambulatory Visit: Payer: Self-pay | Admitting: Family Medicine

## 2019-07-24 NOTE — Patient Instructions (Addendum)
DUE TO COVID-19 ONLY ONE VISITOR IS ALLOWED TO COME WITH YOU AND STAY IN THE WAITING ROOM ONLY DURING PRE OP AND PROCEDURE DAY OF SURGERY. THE 1 VISITOR MAY VISIT WITH YOU AFTER SURGERY IN YOUR PRIVATE ROOM DURING VISITING HOURS ONLY!  YOU NEED TO HAVE A COVID 19 TEST ON____9-10-2020__ @__10 :00AM_____, THIS TEST MUST BE DONE BEFORE SURGERY, COME  Stephanie [name] , [id number].  (Stephanie [name]) ONCE YOUR COVID TEST IS COMPLETED, PLEASE BEGIN THE QUARANTINE INSTRUCTIONS AS OUTLINED IN YOUR HANDOUT.                Stephanie [name]    Your procedure is scheduled on: 07-30-2019   Report to [facility] Main  Entrance   Report to admitting at 8:00AM     Call this number if you have problems the morning of surgery [place], NO CHEWING GUM [place].   Do not eat food After Midnight. YOU MAY HAVE CLEAR LIQUIDS FROM MIDNIGHT UNTIL 7:30AM. At 4:30AM Please finish the prescribed Pre-Surgery ENSURE drink. Nothing by mouth after you finish the ENSURE drink !   CLEAR LIQUID DIET   Foods Allowed                                                                     Foods Excluded  Coffee and tea, regular and decaf                             liquids that you cannot  Plain Jell-O any favor except red or purple                                           see through such as: Fruit ices (not with fruit pulp)                                     milk, soups, orange juice  Iced Popsicles                                    All solid food Carbonated beverages, regular and diet                                    Cranberry, grape and apple juices Sports drinks like Gatorade Lightly seasoned clear broth or consume(fat free) Sugar, honey syrup  Sample Menu Breakfast                                Lunch                                     Supper  Cranberry juice                    Beef broth                             Chicken broth Jell-O                                     Grape juice                           Apple juice Coffee or tea                        Jell-O                                      Popsicle                                                Coffee or tea                        Coffee or tea  _____________________________________________________________________     Take these medicines the morning of surgery with A SIP OF WATER: CARBIDOPA-LEVIDOPA, ESTRADIOL, PROVERA, SERTRALINE                                  You may not have any metal on your body including hair pins and              piercings  Do not wear jewelry, make-up, lotions, powders or perfumes, deodorant             Do not wear nail polish.  Do not shave  48 hours prior to surgery.              Do not bring valuables to the hospital. [place].  Contacts, dentures or bridgework may not be worn into surgery.                Please read over the following fact sheets you were given: _____________________________________________________________________             [facility] - Preparing for Surgery Before surgery, you can play an important role.  Because skin is not sterile, your skin needs to be as free of germs as possible.  You can reduce the number of germs on your skin by washing with CHG (chlorahexidine gluconate) soap before surgery.  CHG is an antiseptic cleaner which kills germs and bonds with the skin to continue killing germs even after washing. Please DO NOT use if you have an allergy to CHG or antibacterial soaps.  If your skin becomes reddened/irritated stop using the CHG and inform your nurse when you arrive at Short Stay. Do not shave (including legs and underarms) for at least 48 hours prior to the first CHG shower.  You may shave your  face/neck. Please follow these instructions carefully:  1.  Shower with CHG Soap the night before surgery and the   morning of Surgery.  2.  If you choose to wash your hair, wash your hair first as usual with your  normal  shampoo.  3.  After you shampoo, rinse your hair and body thoroughly to remove the  shampoo.                           4.  Use CHG as you would any other liquid soap.  You can apply chg directly  to the skin and wash                       Gently with a scrungie or clean washcloth.  5.  Apply the CHG Soap to your body ONLY FROM THE NECK DOWN.   Do not use on face/ open                           Wound or open sores. Avoid contact with eyes, ears mouth and genitals (private parts).                       Wash face,  Genitals (private parts) with your normal soap.             6.  Wash thoroughly, paying special attention to the area where your surgery  will be performed.  7.  Thoroughly rinse your body with warm water from the neck down.  8.  DO NOT shower/wash with your normal soap after using and rinsing off  the CHG Soap.                9.  Pat yourself dry with a clean towel.            10.  Wear clean pajamas.            11.  Place clean sheets on your bed the night of your first shower and do not  sleep with pets. Day of Surgery : Do not apply any lotions/deodorants the morning of surgery.  Please wear clean clothes to the hospital/surgery center.  FAILURE TO FOLLOW THESE INSTRUCTIONS MAY RESULT IN THE CANCELLATION OF YOUR SURGERY PATIENT SIGNATURE_________________________________  NURSE SIGNATURE__________________________________  ________________________________________________________________________   Stephanie [name]  An incentive spirometer is a tool that can help keep your lungs clear and active. This tool measures how well you are filling your lungs with each breath. Taking long deep breaths may help reverse or decrease the chance of developing breathing (pulmonary) problems (especially infection) following:  A long period of time when you are unable to move or be  active. BEFORE THE PROCEDURE   If the spirometer includes an indicator to show your best effort, your nurse or respiratory therapist will set it to a desired goal.  If possible, sit up straight or lean slightly forward. Try not to slouch.  Hold the incentive spirometer in an upright position. INSTRUCTIONS FOR USE  1. Sit on the edge of your bed if possible, or sit up as far as you can in bed or on a chair. 2. Hold the incentive spirometer in an upright position. 3. Breathe out normally. 4. Place the mouthpiece in your mouth and seal your lips tightly around it. 5. Breathe in slowly and as deeply as possible, raising the piston or  the ball toward the top of the column. 6. Hold your breath for 3-5 seconds or for as long as possible. Allow the piston or ball to fall to the bottom of the column. 7. Remove the mouthpiece from your mouth and breathe out normally. 8. Rest for a few seconds and repeat Steps 1 through 7 at least 10 times every 1-2 hours when you are awake. Take your time and take a few normal breaths between deep breaths. 9. The spirometer may include an indicator to show your best effort. Use the indicator as a goal to work toward during each repetition. 10. After each set of 10 deep breaths, practice coughing to be sure your lungs are clear. If you have an incision (the cut made at the time of surgery), support your incision when coughing by placing a pillow or rolled up towels firmly against it. Once you are able to get out of bed, walk around indoors and cough well. You may stop using the incentive spirometer when instructed by your caregiver.  RISKS AND COMPLICATIONS  Take your time so you do not get dizzy or light-headed.  If you are in pain, you may need to take or ask for pain medication before doing incentive spirometry. It is harder to take a deep breath if you are having pain. AFTER USE  Rest and breathe slowly and easily.  It can be helpful to keep track of a log of  your progress. Your caregiver can provide you with a simple table to help with this. If you are using the spirometer at home, follow these instructions: [place] IF:   You are having difficultly using the spirometer.  You have trouble using the spirometer as often as instructed.  Your pain medication is not giving enough relief while using the spirometer.  You develop fever of 100.5 F (38.1 C) or higher. SEEK IMMEDIATE MEDICAL CARE IF:   You cough up bloody sputum that had not been present before.  You develop fever of 102 F (38.9 C) or greater.  You develop worsening pain at or near the incision site. MAKE SURE YOU:   Understand these instructions.  Will watch your condition.  Will get help right away if you are not doing well or get worse. Document Released: 03/14/2007 Document Revised: 01/24/2012 Document Reviewed: 05/15/2007 [employer] Patient Information 2014 [employer], [place].   ________________________________________________________________________  WHAT IS A BLOOD TRANSFUSION? Blood Transfusion Information  A transfusion is the replacement of blood or some of its parts. Blood is made up of multiple cells which provide different functions.  Red blood cells carry oxygen and are used for blood loss replacement.  White blood cells fight against infection.  Platelets control bleeding.  Plasma helps clot blood.  Other blood products are available for specialized needs, such as hemophilia or other clotting disorders. BEFORE THE TRANSFUSION  Who gives blood for transfusions?   Healthy volunteers who are fully evaluated to make sure their blood is safe. This is blood bank blood. Transfusion therapy is the safest it has ever been in the practice of medicine. Before blood is taken from a donor, a complete history is taken to make sure that person has no history of diseases nor engages in risky social behavior (examples are intravenous drug use or sexual activity  with multiple partners). The donor's travel history is screened to minimize risk of transmitting infections, such as malaria. The donated blood is tested for signs of infectious diseases, such as HIV and hepatitis. The  blood is then tested to be sure it is compatible with you in order to minimize the chance of a transfusion reaction. If you or a relative donates blood, this is often done in anticipation of surgery and is not appropriate for emergency situations. It takes many days to process the donated blood. RISKS AND COMPLICATIONS Although transfusion therapy is very safe and saves many lives, the main dangers of transfusion include:   Getting an infectious disease.  Developing a transfusion reaction. This is an allergic reaction to something in the blood you were given. Every precaution is taken to prevent this. The decision to have a blood transfusion has been considered carefully by your caregiver before blood is given. Blood is not given unless the benefits outweigh the risks. AFTER THE TRANSFUSION  Right after receiving a blood transfusion, you will usually feel much better and more energetic. This is especially true if your red blood cells have gotten low (anemic). The transfusion raises the level of the red blood cells which carry oxygen, and this usually causes an energy increase.  The nurse administering the transfusion will monitor you carefully for complications. HOME CARE INSTRUCTIONS  No special instructions are needed after a transfusion. You may find your energy is better. Speak with your caregiver about any limitations on activity for underlying diseases you may have. SEEK MEDICAL CARE IF:   Your condition is not improving after your transfusion.  You develop redness or irritation at the intravenous (IV) site. SEEK IMMEDIATE MEDICAL CARE IF:  Any of the following symptoms occur over the next 12 hours:  Shaking chills.  You have a temperature by mouth above 102 F (38.9  C), not controlled by medicine.  Chest, back, or muscle pain.  People around you feel you are not acting correctly or are confused.  Shortness of breath or difficulty breathing.  Dizziness and fainting.  You get a rash or develop hives.  You have a decrease in urine output.  Your urine turns a dark color or changes to pink, red, or brown. Any of the following symptoms occur over the next 10 days:  You have a temperature by mouth above 102 F (38.9 C), not controlled by medicine.  Shortness of breath.  Weakness after normal activity.  The white part of the eye turns yellow (jaundice).  You have a decrease in the amount of urine or are urinating less often.  Your urine turns a dark color or changes to pink, red, or brown. Document Released: 10/29/2000 Document Revised: 01/24/2012 Document Reviewed: 06/17/2008 [facility] Patient Information 2014 [place], [place].  _______________________________________________________________________

## 2019-07-25 ENCOUNTER — Encounter (HOSPITAL_COMMUNITY): Payer: Self-pay

## 2019-07-25 ENCOUNTER — Encounter (HOSPITAL_COMMUNITY)
Admission: RE | Admit: 2019-07-25 | Discharge: 2019-07-25 | Disposition: A | Discharge status: Home / Self Care | Payer: Medicare Other | Source: Ambulatory Visit | Attending: Orthopedic Surgery | Admitting: Orthopedic Surgery

## 2019-07-25 ENCOUNTER — Other Ambulatory Visit: Payer: Self-pay

## 2019-07-25 DIAGNOSIS — M1711 Unilateral primary osteoarthritis, right knee: Secondary | ICD-10-CM | POA: Diagnosis not present

## 2019-07-25 DIAGNOSIS — Z01812 Encounter for preprocedural laboratory examination: Secondary | ICD-10-CM | POA: Diagnosis not present

## 2019-07-25 DIAGNOSIS — Z20828 Contact with and (suspected) exposure to other viral communicable diseases: Secondary | ICD-10-CM | POA: Diagnosis not present

## 2019-07-25 HISTORY — DX: Other symptoms and signs involving the musculoskeletal system: R29.898

## 2019-07-25 LAB — CBC
HCT: 45.3 % (ref 36.0–46.0)
Hemoglobin: 15.2 g/dL — ABNORMAL HIGH (ref 12.0–15.0)
MCH: 31.7 pg (ref 26.0–34.0)
MCHC: 33.6 g/dL (ref 30.0–36.0)
MCV: 94.4 fL (ref 80.0–100.0)
Platelets: 187 10*3/uL (ref 150–400)
RBC: 4.8 MIL/uL (ref 3.87–5.11)
RDW: 12.9 % (ref 11.5–15.5)
WBC: 5.8 10*3/uL (ref 4.0–10.5)
nRBC: 0 % (ref 0.0–0.2)

## 2019-07-25 LAB — COMPREHENSIVE METABOLIC PANEL
ALT: 6 U/L (ref 0–44)
AST: 29 U/L (ref 15–41)
Albumin: 5.2 g/dL — ABNORMAL HIGH (ref 3.5–5.0)
Alkaline Phosphatase: 45 U/L (ref 38–126)
Anion gap: 8 (ref 5–15)
BUN: 18 mg/dL (ref 8–23)
CO2: 32 mmol/L (ref 22–32)
Calcium: 9.7 mg/dL (ref 8.9–10.3)
Chloride: 93 mmol/L — ABNORMAL LOW (ref 98–111)
Creatinine, Ser: 0.68 mg/dL (ref 0.44–1.00)
GFR calc Af Amer: >60 mL/min (ref >60)
GFR calc non Af Amer: >60 mL/min (ref >60)
Glucose, Bld: 104 mg/dL — ABNORMAL HIGH (ref 70–99)
Potassium: 4.3 mmol/L (ref 3.5–5.1)
Sodium: 133 mmol/L — ABNORMAL LOW (ref 135–145)
Total Bilirubin: 0.9 mg/dL (ref 0.3–1.2)
Total Protein: 7.5 g/dL (ref 6.5–8.1)

## 2019-07-25 LAB — SURGICAL PCR SCREEN
MRSA, PCR: NEGATIVE
Staphylococcus aureus: NEGATIVE

## 2019-07-25 LAB — PROTIME-INR
INR: 1 (ref 0.8–1.2)
Prothrombin Time: 13.2 seconds (ref 11.4–15.2)

## 2019-07-25 LAB — APTT: aPTT: 27 seconds (ref 24–36)

## 2019-07-25 NOTE — H&P (Signed)
TOTAL KNEE ADMISSION H&P  Patient is being admitted for right total knee arthroplasty.  Subjective:  Chief Complaint:right knee pain.  HPI: MODESTA EVEREST, 85 y.o. female, has a history of pain and functional disability in the right knee due to arthritis and has failed non-surgical conservative treatments for greater than 12 weeks to includeNSAID's and/or analgesics, corticosteriod injections and activity modification.  Onset of symptoms was gradual, starting >10 years ago with gradually worsening course since that time. The patient noted no past surgery on the right knee(s).  Patient currently rates pain in the right knee(s) at 8 out of 10 with activity. Patient has night pain, worsening of pain with activity and weight bearing, pain that interferes with activities of daily living, pain with passive range of motion, crepitus and joint swelling.  Patient has evidence of periarticular osteophytes and joint space narrowing by imaging studies.  There is no active infection.  Patient Active Problem List   Diagnosis Date Noted  . Tinea corporis 06/08/2019  . Psoriatic arthritis ([place]) 04/02/2019  . Anemia 03/13/2018  . Hot flashes 03/13/2018  . S/P cervical spinal fusion 03/31/2017  . Neck pain 12/12/2016  . Depression with anxiety 10-29-2016  . Bladder cancer ([place]) 2016/10/29  . OA (osteoarthritis) of hip 07/07/2016  . Spinal stenosis of lumbar region with radiculopathy 04/08/2016  . Spondylolisthesis of lumbar region 04/08/2016  . Spondylosis of cervical region without myelopathy or radiculopathy 04/08/2016  . Preventative health care 01/18/2016  . Low back pain 01/18/2016  . History of chicken pox   . Hyperlipidemia 12/15/2014  . Allergic rhinitis 06/25/2014  . Allergy to bee sting 06/10/2014  . TMJ tenderness 11/16/2013  . Other malaise and fatigue 05/07/2013  . Arthropathy of cervical spine ([place]) 11/27/2012  . Muscle spasms of neck 11/27/2012  . Parkinson's disease ([place]) 05/24/2012   . Conjunctivochalasis 03/09/2012  . Epiretinal membrane 03/09/2012  . Hyperopia with astigmatism and presbyopia 03/09/2012  . Pseudophakia 03/09/2012  . HTN (hypertension) 08/31/2011  . Right knee pain 08/31/2011  . Benign hypertensive heart disease without heart failure 05/12/2011   Past Medical History:  Diagnosis Date  . Arthropathy of cervical spine 11/27/2012   Right  Xray at [facility]  On 04/08/2016  AP, lateral, and lateral flexion and extension views of the cervical spine are submitted for evaluation.   No acute fracture identified in the cervical spine.   There is redemonstration of approximately 2 mm of C3 on C4 anterolisthesis in neutral which slightly increases in flexion relative to neutral and extension. Slight C5 on C6 retrolisthesis does not change i  . Benign hypertension without congestive heart failure   . Benign mole    right hcets mole, bleeds when dried with towel  . Bilateral dry eyes   . Bladder cancer ([place])   . Bladder cancer ([place]) Oct 29, 2016  . Cervical spondylosis   . Cervicalgia    2 screws in place   . Depression with anxiety 29-Oct-2016   prior to husbands death   . Gross hematuria    developed after first hip replacment, led to indicental finding of bladder tumor, bleeding now resolved   . History of kidney stones    1970's  . Left hand weakness    due to reinjury of flxor tendon s/p tendon surgery march 12-2018  . Lumbar stenosis   . Malignant tumor of urinary bladder ([place]) 07/2016   Pre-stage I  . OA (osteoarthritis)   . Parkinson disease [facility]) dx 2012   neurologist-  dr siddiqui at [facility]  . Psoriatic arthritis [place])     Dr Trudie Reed , [place] [facility]   . Rupture of flexor tendon of hand 2020   right    Past Surgical History:  Procedure Laterality Date  . BIOPSY MASS LEFT FIRST METACARPAL   06/23/2006   benign  . CATARACT EXTRACTION W/ INTRAOCULAR LENS  IMPLANT, BILATERAL  1999 and 2003  . CERVICAL FUSION  02/2017   c1-c2 ; reports she has 2 screws  2in long in place [facility] ; patient exhibits VERY LIMITED NECK ROM  . CYSTOSCOPY W/ RETROGRADES Bilateral 09/22/2016   Procedure: CYSTOSCOPY WITH RETROGRADE PYELOGRAM;  Surgeon: Alexis Frock, MD;  Location: [facility];  Service: Urology;  Laterality: Bilateral;  . D & C HYSTEROSCOPY W/ RESECTION POLYP  07/11/2000  . DILATION AND CURETTAGE OF UTERUS  1960's  . EXCISION CYST AND DEBRIDEMENT RIGHT WIRST AND REMOVAL [place] BODY  01/09/2009  . FLEXOR TENDON REPAIR Left 12/12/2018   Procedure: REPAIR/TRANSFER FLEXOR DIGITORUM PROFUNDUS OF LEFT SMALL FINGER;  Surgeon: Daryll Brod, MD;  Location: [place];  Service: Orthopedics;  Laterality: Left;  . LAPAROSCOPY  yrs ago   infertility   . METACARPOPHALANGEAL JOINT ARTHRODESIS Right 05/25/1996  . TENDON REPAIR Left 01/15/2019   Hand  . TONSILLECTOMY AND ADENOIDECTOMY  child  . TOTAL HIP ARTHROPLASTY Left 07/07/2016   Procedure: LEFT TOTAL HIP ARTHROPLASTY ANTERIOR APPROACH;  Surgeon: Gaynelle Arabian, MD;  Location: [facility];  Service: Orthopedics;  Laterality: Left;  . TOTAL HIP ARTHROPLASTY Right 09/28/2017   Procedure: RIGHT TOTAL HIP ARTHROPLASTY ANTERIOR APPROACH;  Surgeon: Gaynelle Arabian, MD;  Location: [facility];  Service: Orthopedics;  Laterality: Right;  . TRANSURETHRAL RESECTION OF BLADDER TUMOR N/A 09/22/2016   Procedure: TRANSURETHRAL RESECTION OF BLADDER TUMOR (TURBT);  Surgeon: Alexis Frock, MD;  Location: [facility];  Service: Urology;  Laterality: N/A;       Current Outpatient Medications  Medication Sig Dispense Refill Last Dose  . carbidopa-levodopa (SINEMET IR) 25-100 MG per tablet Take 1 tablet by mouth 4 (four) times daily.      . Carbidopa-Levodopa ER (SINEMET CR) 25-100 MG tablet controlled release Take 1 tablet by mouth at bedtime.     . Cholecalciferol (VITAMIN D) 50 MCG (2000 UT) tablet Take 2,000 Units by mouth daily.     Marland Kitchen CO-ENZYME Q-10 PO Take 300 mg by mouth  daily.      . diazepam (VALIUM) 5 MG tablet Take 2.5 mg by mouth at bedtime.     . diclofenac (VOLTAREN) 50 MG EC tablet Take 50 mg by mouth daily.      . diclofenac sodium (VOLTAREN) 1 % GEL Apply 1 application topically 3 (three) times daily as needed (knee pain).     Marland Kitchen estradiol (ESTRACE) 0.5 MG tablet Take 1/2 tab po q day (Patient taking differently: Take 0.25 mg by mouth daily. Take 1/2 tab po q day) 45 tablet 4   . fluticasone (CUTIVATE) 0.05 % cream Apply 1 application topically daily as needed (scalp psoriasis).   1   . golimumab (SIMPONI ARIA) 50 MG/4ML SOLN injection Inject 50 mg into the vein See admin instructions. Given every 2 months - Last given 06/28/2019     . hydrochlorothiazide (HYDRODIURIL) 25 MG tablet TAKE 1 AND 1/2 TABLETS DAILY BY MOUTH (Patient taking differently: Take 37.5 mg by mouth daily. ) 135 tablet 1   . medroxyPROGESTERone (PROVERA) 2.5 MG tablet Take  0.5 tablets (1.25 mg total) by mouth daily. 45 tablet 4   . Multiple Vitamin (MULTI-VITAMINS) TABS Take 1 tablet by mouth daily.      Vladimir Faster Glycol-Propyl Glycol (SYSTANE) 0.4-0.3 % SOLN Apply 1 drop to eye 2 (two) times daily.      . Probiotic Product (PROBIOTIC DAILY PO) Take 1 capsule by mouth daily. [place]     . sertraline (ZOLOFT) 25 MG tablet TAKE 1 TABLET BY MOUTH EVERY DAY IN THE MORNING (Patient taking differently: Take 25 mg by mouth daily. ) 90 tablet 1   . vitamin C (ASCORBIC ACID) 500 MG tablet Take 500 mg by mouth daily.       Allergies  Allergen Reactions  . Codeine Nausea And Vomiting  . Hornet Venom   . Penicillins Itching and Swelling    Has patient had a PCN reaction causing immediate rash, facial/tongue/throat swelling, SOB or lightheadedness with hypotension: Yes Has patient had a PCN reaction causing severe rash involving mucus membranes or skin necrosis: No Has patient had a PCN reaction that required hospitalization: No Has patient had a PCN reaction occurring within the last 10  years: No If all of the above answers are "NO", then may proceed with Cephalosporin use.   . [place]et Venom Swelling  . Lodine [Etodolac] Rash  . Tramadol Itching and Rash    Social History   Tobacco Use  . Smoking status: Former Smoker    Years: 10.00    Quit date: 05/04/1964    Years since quitting: 55.2  . Smokeless tobacco: Never Used  Substance Use Topics  . Alcohol use: Yes    Alcohol/week: 7.0 standard drinks    Types: 7 Glasses of wine per week    Family History  Problem Relation Age of Onset  . Arthritis Mother   . Hypertension Mother   . Deep vein thrombosis Mother        recurrent, secondary to Bleeding disorder  . Arthritis Father   . Stroke Father   . Diabetes Sister   . Heart disease Sister   . Obesity Sister   . Arthritis Sister   . Hypertension Sister   . Heart disease Maternal Grandmother   . Heart disease Maternal Grandfather   . Peripheral vascular disease Paternal Grandmother        s/p leg amputation  . Stroke Paternal Grandfather      Review of Systems  Constitutional: Negative.   HENT: Negative.   Eyes: Negative.   Respiratory: Negative.   Cardiovascular: Negative.   Gastrointestinal: Negative.   Genitourinary: Negative.   Musculoskeletal: Positive for back pain, joint pain and myalgias. Negative for falls and neck pain.  Skin: Negative.   Neurological: Negative.   Endo/Heme/Allergies: Negative.   Psychiatric/Behavioral: Negative for depression, hallucinations, memory loss, substance abuse and suicidal ideas. The patient is nervous/anxious. The patient does not have insomnia.     Objective:  Physical Exam  Constitutional: She is oriented to person, place, and time. She appears well-developed and well-nourished. No distress.  HENT:  Head: Normocephalic and atraumatic.  Right Ear: External ear normal.  Left Ear: External ear normal.  Nose: Nose normal.  Mouth/Throat: Oropharynx is clear and moist.  Eyes: Conjunctivae and EOM  are normal.  Neck: Normal range of motion. Neck supple.  Cardiovascular: Normal rate, regular rhythm, normal heart sounds and intact distal pulses.  No murmur heard. Respiratory: Effort normal and breath sounds normal. No respiratory distress. She has no wheezes.  GI:  Soft. Bowel sounds are normal. She exhibits no distension. There is no abdominal tenderness.  Musculoskeletal:     Left hip: Normal.     Left knee: Normal.     Comments: Significantly antalgic gait pattern favoring the right side without using assisted devices.  Right Knee Exam: No effusion. Presence of a large Baker's cyst. Valgus deformity. Range of motion is 5-125 degrees. No crepitus on range of motion of the knee. Positive lateral, greater than medial, joint line tenderness. Stable knee.  Right hip Flexion 120, IR 30, ER 40, Abduction 40 without discomfort She has trochanteric tenderness  Neurological: She is alert and oriented to person, place, and time. She has normal strength and normal reflexes. No sensory deficit.  Skin: Rash noted. She is not diaphoretic. No erythema.  Psychiatric: She has a normal mood and affect. Her behavior is normal.    Vital signs in last 24 hours: Temp:  [98.5 F (36.9 C)] 98.5 F (36.9 C) (09/09 1037) Pulse Rate:  [85] 85 (09/09 1037) Resp:  [16] 16 (09/09 1037) BP: (150)/(90) 150/90 (09/09 1037) SpO2:  [100 %] 100 % (09/09 1037) Weight:  [56.7 kg] 56.7 kg (09/09 1037)  Labs:   Estimated body mass index is 22.14 kg/m as calculated from the following:   Height as of 07/25/19: 5\' 3"  (1.6 m).   Weight as of 07/25/19: 56.7 kg.   Imaging Review Plain radiographs demonstrate severe degenerative joint disease of the right knee(s). The overall alignment ismild valgus. The bone quality appears to be good for age and reported activity level.    Assessment/Plan:  End stage primary osteoarthritis, right knee   The patient history, physical examination, clinical judgment of the  provider and imaging studies are consistent with end stage degenerative joint disease of the right knee(s) and total knee arthroplasty is deemed medically necessary. The treatment options including medical management, injection therapy arthroscopy and arthroplasty were discussed at length. The risks and benefits of total knee arthroplasty were presented and reviewed. The risks due to aseptic loosening, infection, stiffness, patella tracking problems, thromboembolic complications and other imponderables were discussed. The patient acknowledged the explanation, agreed to proceed with the plan and consent was signed. Patient is being admitted for inpatient treatment for surgery, pain control, PT, OT, prophylactic antibiotics, VTE prophylaxis, progressive ambulation and ADL's and discharge planning. The patient is planning to be discharged home    Anticipated LOS equal to or greater than 2 midnights due to - Age 43 and older with one or more of the following:  - Obesity  - Expected need for hospital services (PT, OT, Nursing) required for safe  discharge  - Anticipated need for postoperative skilled nursing care or inpatient rehab  - Active co-morbidities: None OR   - Unanticipated findings during/Post Surgery: None  - Patient is a high risk of re-admission due to: None    Risks and benefits of the surgery were discussed with the patient and Dr.Aluisio at their previous office visit, and the patient has elected to move forward with the aforementioned surgery. Post-operative care plans were discussed with the patient today.   Therapy Plans: outpatient therapy at [facility] 9/18 Disposition: Home with daughter Planned DVT prophylaxis: Xarelto 10mg  daily DME needed: has walker PCP: Dr. Randel Pigg  Other: no anesthesia concerns History of constipation  Instructed patient on meds to stop prior to surgery  Questions and concerns were welcomed and addressed. If they have any further questions or concerns  that arise, they are instructed to  call the office at any time.  Ardeen Jourdain, PA-C

## 2019-07-26 ENCOUNTER — Other Ambulatory Visit (HOSPITAL_COMMUNITY)
Admission: RE | Admit: 2019-07-26 | Discharge: 2019-07-26 | Disposition: A | Discharge status: Home / Self Care | Payer: Medicare Other | Source: Ambulatory Visit | Attending: Orthopedic Surgery | Admitting: Orthopedic Surgery

## 2019-07-26 DIAGNOSIS — Z20828 Contact with and (suspected) exposure to other viral communicable diseases: Secondary | ICD-10-CM | POA: Diagnosis not present

## 2019-07-26 DIAGNOSIS — M1711 Unilateral primary osteoarthritis, right knee: Secondary | ICD-10-CM | POA: Diagnosis not present

## 2019-07-26 DIAGNOSIS — Z01812 Encounter for preprocedural laboratory examination: Secondary | ICD-10-CM | POA: Diagnosis not present

## 2019-07-26 NOTE — Progress Notes (Signed)
PCP - Mosie Lukes, MD-- surgical clearance 05-14-2019 on chart  Cardiologist -   Chest x-ray -  EKG - epic 2020 Stress Test -  ECHO -  Epic 2018 Cardiac Cath -   Sleep Study -  CPAP -   Fasting Blood Sugar -  Checks Blood Sugar _____ times a day  Blood Thinner Instructions: Aspirin Instructions: Last Dose:  Anesthesia review:   reports she thinks  has re-injured left small finger that she she had surgery on earlier this year with Dr Fredna Dow. Patient states she will contact Aluisio and make him aware.   Patient denies shortness of breath, fever, cough and chest pain at PAT appointment   Patient verbalized understanding of instructions that were given to them at the PAT appointment. Patient was also instructed that they will need to review over the PAT instructions again at home before surgery.

## 2019-07-27 ENCOUNTER — Ambulatory Visit: Payer: Medicare Other | Admitting: Family Medicine

## 2019-07-28 LAB — NOVEL CORONAVIRUS, NAA (HOSP ORDER, SEND-OUT TO REF LAB; TAT 18-24 HRS): SARS-CoV-2, NAA: NOT DETECTED

## 2019-07-29 MED ORDER — BUPIVACAINE LIPOSOME 1.3 % IJ SUSP
20.0000 mL | Freq: Once | INTRAMUSCULAR | Status: DC
  Filled 2019-07-29: qty 20

## 2019-07-30 ENCOUNTER — Encounter (HOSPITAL_COMMUNITY)
Admission: RE | Disposition: A | Discharge status: Home / Self Care | Payer: Self-pay | Source: Home / Self Care | Attending: Orthopedic Surgery

## 2019-07-30 ENCOUNTER — Observation Stay (HOSPITAL_COMMUNITY)
Admission: RE | Admit: 2019-07-30 | Discharge: 2019-07-31 | Disposition: A | Discharge status: Home / Self Care | Payer: Medicare Other | Attending: Orthopedic Surgery | Admitting: Orthopedic Surgery

## 2019-07-30 ENCOUNTER — Inpatient Hospital Stay (HOSPITAL_COMMUNITY): Payer: Medicare Other | Admitting: Anesthesiology

## 2019-07-30 ENCOUNTER — Encounter (HOSPITAL_COMMUNITY): Payer: Self-pay | Admitting: *Deleted

## 2019-07-30 ENCOUNTER — Other Ambulatory Visit: Payer: Self-pay

## 2019-07-30 ENCOUNTER — Inpatient Hospital Stay (HOSPITAL_COMMUNITY): Payer: Medicare Other | Admitting: Physician Assistant

## 2019-07-30 DIAGNOSIS — Z87891 Personal history of nicotine dependence: Secondary | ICD-10-CM | POA: Diagnosis not present

## 2019-07-30 DIAGNOSIS — Z880 Allergy status to penicillin: Secondary | ICD-10-CM | POA: Diagnosis not present

## 2019-07-30 DIAGNOSIS — M1711 Unilateral primary osteoarthritis, right knee: Secondary | ICD-10-CM | POA: Diagnosis not present

## 2019-07-30 DIAGNOSIS — Z91030 Bee allergy status: Secondary | ICD-10-CM | POA: Insufficient documentation

## 2019-07-30 DIAGNOSIS — F418 Other specified anxiety disorders: Secondary | ICD-10-CM | POA: Diagnosis not present

## 2019-07-30 DIAGNOSIS — Z87442 Personal history of urinary calculi: Secondary | ICD-10-CM | POA: Diagnosis not present

## 2019-07-30 DIAGNOSIS — Z8551 Personal history of malignant neoplasm of bladder: Secondary | ICD-10-CM | POA: Insufficient documentation

## 2019-07-30 DIAGNOSIS — G20 Parkinson's disease: Secondary | ICD-10-CM | POA: Diagnosis not present

## 2019-07-30 DIAGNOSIS — M169 Osteoarthritis of hip, unspecified: Secondary | ICD-10-CM | POA: Diagnosis present

## 2019-07-30 DIAGNOSIS — G8918 Other acute postprocedural pain: Secondary | ICD-10-CM | POA: Diagnosis not present

## 2019-07-30 DIAGNOSIS — I10 Essential (primary) hypertension: Secondary | ICD-10-CM | POA: Insufficient documentation

## 2019-07-30 DIAGNOSIS — M179 Osteoarthritis of knee, unspecified: Secondary | ICD-10-CM

## 2019-07-30 DIAGNOSIS — Z888 Allergy status to other drugs, medicaments and biological substances status: Secondary | ICD-10-CM | POA: Diagnosis not present

## 2019-07-30 DIAGNOSIS — I119 Hypertensive heart disease without heart failure: Secondary | ICD-10-CM | POA: Diagnosis not present

## 2019-07-30 DIAGNOSIS — Z885 Allergy status to narcotic agent status: Secondary | ICD-10-CM | POA: Insufficient documentation

## 2019-07-30 DIAGNOSIS — Z791 Long term (current) use of non-steroidal anti-inflammatories (NSAID): Secondary | ICD-10-CM | POA: Diagnosis not present

## 2019-07-30 DIAGNOSIS — Z79899 Other long term (current) drug therapy: Secondary | ICD-10-CM | POA: Diagnosis not present

## 2019-07-30 DIAGNOSIS — L4050 Arthropathic psoriasis, unspecified: Secondary | ICD-10-CM | POA: Insufficient documentation

## 2019-07-30 DIAGNOSIS — M48061 Spinal stenosis, lumbar region without neurogenic claudication: Secondary | ICD-10-CM | POA: Insufficient documentation

## 2019-07-30 DIAGNOSIS — M1710 Unilateral primary osteoarthritis, unspecified knee: Secondary | ICD-10-CM

## 2019-07-30 DIAGNOSIS — Z79890 Hormone replacement therapy (postmenopausal): Secondary | ICD-10-CM | POA: Insufficient documentation

## 2019-07-30 HISTORY — PX: TOTAL KNEE ARTHROPLASTY: SHX125

## 2019-07-30 LAB — TYPE AND SCREEN
ABO/RH(D): A NEG
Antibody Screen: NEGATIVE

## 2019-07-30 SURGERY — ARTHROPLASTY, KNEE, TOTAL
Anesthesia: Spinal | Site: Knee | Laterality: Right

## 2019-07-30 MED ORDER — PROPOFOL 500 MG/50ML IV EMUL
INTRAVENOUS | Status: DC | PRN
  Administered 2019-07-30: 25 ug/kg/min via INTRAVENOUS

## 2019-07-30 MED ORDER — SODIUM CHLORIDE 0.9 % IR SOLN
Status: DC | PRN
  Administered 2019-07-30: 1000 mL

## 2019-07-30 MED ORDER — MENTHOL 3 MG MT LOZG: 1.0000 | LOZENGE | OROMUCOSAL | Status: DC | PRN

## 2019-07-30 MED ORDER — ONDANSETRON HCL 4 MG/2ML IJ SOLN
INTRAMUSCULAR | Status: AC
  Filled 2019-07-30: qty 2

## 2019-07-30 MED ORDER — ONDANSETRON HCL 4 MG/2ML IJ SOLN
INTRAMUSCULAR | Status: DC | PRN
  Administered 2019-07-30: 4 mg via INTRAVENOUS

## 2019-07-30 MED ORDER — BUPIVACAINE IN DEXTROSE 0.75-8.25 % IT SOLN
INTRATHECAL | Status: DC | PRN
  Administered 2019-07-30: 1.6 mL via INTRATHECAL

## 2019-07-30 MED ORDER — METHOCARBAMOL 500 MG PO TABS
500.0000 mg | ORAL_TABLET | Freq: Four times a day (QID) | ORAL | Status: DC | PRN
  Administered 2019-07-30 – 2019-07-31 (×2): 500 mg via ORAL
  Filled 2019-07-30 (×2): qty 1

## 2019-07-30 MED ORDER — ACETAMINOPHEN 10 MG/ML IV SOLN
1000.0000 mg | Freq: Four times a day (QID) | INTRAVENOUS | Status: DC
  Administered 2019-07-30: 1000 mg via INTRAVENOUS
  Filled 2019-07-30: qty 100

## 2019-07-30 MED ORDER — CHLORHEXIDINE GLUCONATE 4 % EX LIQD: 60.0000 mL | Freq: Once | CUTANEOUS | Status: DC

## 2019-07-30 MED ORDER — CARBIDOPA-LEVODOPA ER 25-100 MG PO TBCR
1.0000 | EXTENDED_RELEASE_TABLET | Freq: Every day | ORAL | Status: DC
  Administered 2019-07-30: 1 via ORAL
  Filled 2019-07-30 (×2): qty 1

## 2019-07-30 MED ORDER — POVIDONE-IODINE 10 % EX SWAB
2.0000 "application " | Freq: Once | CUTANEOUS | Status: AC
  Administered 2019-07-30: 2 via TOPICAL

## 2019-07-30 MED ORDER — METHOCARBAMOL 500 MG IVPB - SIMPLE MED
500.0000 mg | Freq: Four times a day (QID) | INTRAVENOUS | Status: DC | PRN
  Filled 2019-07-30: qty 50

## 2019-07-30 MED ORDER — SODIUM CHLORIDE (PF) 0.9 % IJ SOLN
INTRAMUSCULAR | Status: DC | PRN
  Administered 2019-07-30: 60 mL

## 2019-07-30 MED ORDER — DEXAMETHASONE SODIUM PHOSPHATE 10 MG/ML IJ SOLN
8.0000 mg | Freq: Once | INTRAMUSCULAR | Status: AC
  Administered 2019-07-30: 8 mg via INTRAVENOUS

## 2019-07-30 MED ORDER — ACETAMINOPHEN 500 MG PO TABS
1000.0000 mg | ORAL_TABLET | Freq: Four times a day (QID) | ORAL | Status: AC
  Administered 2019-07-30 – 2019-07-31 (×4): 1000 mg via ORAL
  Filled 2019-07-30 (×4): qty 2

## 2019-07-30 MED ORDER — LACTATED RINGERS IV SOLN
INTRAVENOUS | Status: DC
  Administered 2019-07-30 (×2): via INTRAVENOUS

## 2019-07-30 MED ORDER — ROPIVACAINE HCL 7.5 MG/ML IJ SOLN
INTRAMUSCULAR | Status: DC | PRN
  Administered 2019-07-30: 20 mL via PERINEURAL

## 2019-07-30 MED ORDER — DOCUSATE SODIUM 100 MG PO CAPS
100.0000 mg | ORAL_CAPSULE | Freq: Two times a day (BID) | ORAL | Status: DC
  Administered 2019-07-30 – 2019-07-31 (×2): 100 mg via ORAL
  Filled 2019-07-30 (×2): qty 1

## 2019-07-30 MED ORDER — ONDANSETRON HCL 4 MG PO TABS: 4.0000 mg | ORAL_TABLET | Freq: Four times a day (QID) | ORAL | Status: DC | PRN

## 2019-07-30 MED ORDER — TRANEXAMIC ACID-NACL 1000-0.7 MG/100ML-% IV SOLN
1000.0000 mg | INTRAVENOUS | Status: AC
  Administered 2019-07-30: 1000 mg via INTRAVENOUS
  Filled 2019-07-30: qty 100

## 2019-07-30 MED ORDER — FENTANYL CITRATE (PF) 100 MCG/2ML IJ SOLN: 25.0000 ug | INTRAMUSCULAR | Status: DC | PRN

## 2019-07-30 MED ORDER — DIPHENHYDRAMINE HCL 12.5 MG/5ML PO ELIX: 12.5000 mg | ORAL_SOLUTION | ORAL | Status: DC | PRN

## 2019-07-30 MED ORDER — OXYCODONE HCL 5 MG PO TABS
5.0000 mg | ORAL_TABLET | ORAL | Status: DC | PRN
  Administered 2019-07-30 – 2019-07-31 (×2): 5 mg via ORAL
  Filled 2019-07-30 (×2): qty 1

## 2019-07-30 MED ORDER — RIVAROXABAN 10 MG PO TABS
10.0000 mg | ORAL_TABLET | Freq: Every day | ORAL | Status: DC
  Administered 2019-07-31: 10 mg via ORAL
  Filled 2019-07-30: qty 1

## 2019-07-30 MED ORDER — VANCOMYCIN HCL IN DEXTROSE 1-5 GM/200ML-% IV SOLN
1000.0000 mg | INTRAVENOUS | Status: AC
  Administered 2019-07-30: 1000 mg via INTRAVENOUS
  Filled 2019-07-30: qty 200

## 2019-07-30 MED ORDER — FENTANYL CITRATE (PF) 100 MCG/2ML IJ SOLN
50.0000 ug | INTRAMUSCULAR | Status: DC
  Administered 2019-07-30: 100 ug via INTRAVENOUS
  Filled 2019-07-30: qty 2

## 2019-07-30 MED ORDER — NALOXEGOL OXALATE 12.5 MG PO TABS
12.5000 mg | ORAL_TABLET | Freq: Every day | ORAL | Status: DC
  Administered 2019-07-30 – 2019-07-31 (×2): 12.5 mg via ORAL
  Filled 2019-07-30 (×2): qty 1

## 2019-07-30 MED ORDER — ONDANSETRON HCL 4 MG/2ML IJ SOLN: 4.0000 mg | Freq: Four times a day (QID) | INTRAMUSCULAR | Status: DC | PRN

## 2019-07-30 MED ORDER — METOCLOPRAMIDE HCL 5 MG PO TABS: 5.0000 mg | ORAL_TABLET | Freq: Three times a day (TID) | ORAL | Status: DC | PRN

## 2019-07-30 MED ORDER — CARBIDOPA-LEVODOPA 25-100 MG PO TABS
1.0000 | ORAL_TABLET | Freq: Four times a day (QID) | ORAL | Status: DC
  Administered 2019-07-30 – 2019-07-31 (×3): 1 via ORAL
  Filled 2019-07-30 (×3): qty 1

## 2019-07-30 MED ORDER — OXYCODONE HCL 5 MG PO TABS
10.0000 mg | ORAL_TABLET | ORAL | Status: DC | PRN
  Administered 2019-07-30 – 2019-07-31 (×3): 10 mg via ORAL
  Filled 2019-07-30 (×3): qty 2

## 2019-07-30 MED ORDER — DIAZEPAM 5 MG PO TABS
2.5000 mg | ORAL_TABLET | Freq: Every day | ORAL | Status: DC
  Administered 2019-07-30: 2.5 mg via ORAL
  Filled 2019-07-30: qty 1

## 2019-07-30 MED ORDER — ONDANSETRON HCL 4 MG/2ML IJ SOLN: 4.0000 mg | Freq: Once | INTRAMUSCULAR | Status: DC | PRN

## 2019-07-30 MED ORDER — SODIUM CHLORIDE 0.9 % IV SOLN
INTRAVENOUS | Status: DC | PRN
  Administered 2019-07-30: 80 ug/min via INTRAVENOUS

## 2019-07-30 MED ORDER — BUPIVACAINE LIPOSOME 1.3 % IJ SUSP
INTRAMUSCULAR | Status: DC | PRN
  Administered 2019-07-30: 20 mL

## 2019-07-30 MED ORDER — SODIUM CHLORIDE 0.9 % IV SOLN
INTRAVENOUS | Status: DC
  Administered 2019-07-30 – 2019-07-31 (×2): via INTRAVENOUS

## 2019-07-30 MED ORDER — HYDROCHLOROTHIAZIDE 25 MG PO TABS
37.5000 mg | ORAL_TABLET | Freq: Every day | ORAL | Status: DC
  Administered 2019-07-31: 37.5 mg via ORAL
  Filled 2019-07-30: qty 2

## 2019-07-30 MED ORDER — DEXAMETHASONE SODIUM PHOSPHATE 10 MG/ML IJ SOLN
INTRAMUSCULAR | Status: AC
  Filled 2019-07-30: qty 1

## 2019-07-30 MED ORDER — BISACODYL 10 MG RE SUPP: 10.0000 mg | Freq: Every day | RECTAL | Status: DC | PRN

## 2019-07-30 MED ORDER — STERILE WATER FOR IRRIGATION IR SOLN
Status: DC | PRN
  Administered 2019-07-30 (×2): 1000 mL

## 2019-07-30 MED ORDER — PROPOFOL 10 MG/ML IV BOLUS
INTRAVENOUS | Status: AC
  Filled 2019-07-30: qty 60

## 2019-07-30 MED ORDER — GABAPENTIN 100 MG PO CAPS
100.0000 mg | ORAL_CAPSULE | Freq: Two times a day (BID) | ORAL | Status: DC
  Administered 2019-07-30 – 2019-07-31 (×2): 100 mg via ORAL
  Filled 2019-07-30 (×3): qty 1

## 2019-07-30 MED ORDER — MORPHINE SULFATE (PF) 2 MG/ML IV SOLN
1.0000 mg | INTRAVENOUS | Status: DC | PRN
  Administered 2019-07-31: 1 mg via INTRAVENOUS
  Filled 2019-07-30: qty 1

## 2019-07-30 MED ORDER — PHENYLEPHRINE HCL (PRESSORS) 10 MG/ML IV SOLN
INTRAVENOUS | Status: AC
  Filled 2019-07-30: qty 1

## 2019-07-30 MED ORDER — PHENOL 1.4 % MT LIQD
1.0000 | OROMUCOSAL | Status: DC | PRN
  Filled 2019-07-30: qty 177

## 2019-07-30 MED ORDER — METOCLOPRAMIDE HCL 5 MG/ML IJ SOLN: 5.0000 mg | Freq: Three times a day (TID) | INTRAMUSCULAR | Status: DC | PRN

## 2019-07-30 MED ORDER — SODIUM CHLORIDE (PF) 0.9 % IJ SOLN
INTRAMUSCULAR | Status: AC
  Filled 2019-07-30: qty 10

## 2019-07-30 MED ORDER — SODIUM CHLORIDE (PF) 0.9 % IJ SOLN
INTRAMUSCULAR | Status: AC
  Filled 2019-07-30: qty 50

## 2019-07-30 MED ORDER — DEXAMETHASONE SODIUM PHOSPHATE 10 MG/ML IJ SOLN
10.0000 mg | Freq: Once | INTRAMUSCULAR | Status: AC
  Administered 2019-07-31: 10 mg via INTRAVENOUS
  Filled 2019-07-30: qty 1

## 2019-07-30 MED ORDER — SERTRALINE HCL 25 MG PO TABS
25.0000 mg | ORAL_TABLET | Freq: Every day | ORAL | Status: DC
  Administered 2019-07-31: 25 mg via ORAL
  Filled 2019-07-30: qty 1

## 2019-07-30 MED ORDER — VANCOMYCIN HCL IN DEXTROSE 1-5 GM/200ML-% IV SOLN
1000.0000 mg | Freq: Two times a day (BID) | INTRAVENOUS | Status: AC
  Administered 2019-07-30: 1000 mg via INTRAVENOUS
  Filled 2019-07-30 (×2): qty 200

## 2019-07-30 MED ORDER — SODIUM CHLORIDE 0.9 % IV SOLN: 30.0000 ug/min | INTRAVENOUS | Status: DC

## 2019-07-30 MED ORDER — FLEET ENEMA 7-19 GM/118ML RE ENEM: 1.0000 | ENEMA | Freq: Once | RECTAL | Status: DC | PRN

## 2019-07-30 MED ORDER — PROPOFOL 10 MG/ML IV BOLUS
INTRAVENOUS | Status: DC | PRN
  Administered 2019-07-30 (×2): 20 mg via INTRAVENOUS

## 2019-07-30 MED ORDER — POLYETHYLENE GLYCOL 3350 17 G PO PACK: 17.0000 g | PACK | Freq: Every day | ORAL | Status: DC | PRN

## 2019-07-30 SURGICAL SUPPLY — 57 items
BAG SPEC THK2 15X12 ZIP CLS (MISCELLANEOUS) ×1
BAG ZIPLOCK 12X15 (MISCELLANEOUS) ×2 IMPLANT
BLADE SAG 18X100X1.27 (BLADE) ×2 IMPLANT
BLADE SAW SGTL 11.0X1.19X90.0M (BLADE) ×2 IMPLANT
BLADE SURG SZ10 CARB STEEL (BLADE) ×4 IMPLANT
BNDG ELASTIC 6X5.8 VLCR STR LF (GAUZE/BANDAGES/DRESSINGS) ×2 IMPLANT
BOWL SMART MIX CTS (DISPOSABLE) ×2 IMPLANT
CEMENT HV SMART SET (Cement) ×4 IMPLANT
CEMENT TIBIA MBT (Knees) IMPLANT
COVER SURGICAL LIGHT HANDLE (MISCELLANEOUS) ×2 IMPLANT
COVER WAND RF STERILE (DRAPES) IMPLANT
CUFF TOURN SGL QUICK 34 (TOURNIQUET CUFF) ×2
CUFF TRNQT CYL 34X4.125X (TOURNIQUET CUFF) ×1 IMPLANT
DECANTER SPIKE VIAL GLASS SM (MISCELLANEOUS) ×2 IMPLANT
DRAPE U-SHAPE 47X51 STRL (DRAPES) ×2 IMPLANT
DRSG ADAPTIC 3X8 NADH LF (GAUZE/BANDAGES/DRESSINGS) ×2 IMPLANT
DRSG PAD ABDOMINAL 8X10 ST (GAUZE/BANDAGES/DRESSINGS) ×2 IMPLANT
DURAPREP 26ML APPLICATOR (WOUND CARE) ×2 IMPLANT
ELECT REM PT RETURN 15FT ADLT (MISCELLANEOUS) ×2 IMPLANT
EVACUATOR 1/8 PVC DRAIN (DRAIN) ×2 IMPLANT
FEMUR SIGMA PS SZ 4.0N R (Femur) ×1 IMPLANT
GAUZE SPONGE 4X4 12PLY STRL (GAUZE/BANDAGES/DRESSINGS) ×2 IMPLANT
GLOVE BIO SURGEON STRL SZ7 (GLOVE) ×2 IMPLANT
GLOVE BIO SURGEON STRL SZ8 (GLOVE) ×2 IMPLANT
GLOVE BIOGEL PI IND STRL 6.5 (GLOVE) ×1 IMPLANT
GLOVE BIOGEL PI IND STRL 7.0 (GLOVE) ×1 IMPLANT
GLOVE BIOGEL PI IND STRL 8 (GLOVE) ×1 IMPLANT
GLOVE BIOGEL PI INDICATOR 6.5 (GLOVE) ×1
GLOVE BIOGEL PI INDICATOR 7.0 (GLOVE) ×1
GLOVE BIOGEL PI INDICATOR 8 (GLOVE) ×1
GLOVE SURG SS PI 6.5 STRL IVOR (GLOVE) ×2 IMPLANT
GOWN STRL REUS W/TWL LRG LVL3 (GOWN DISPOSABLE) ×6 IMPLANT
HANDPIECE INTERPULSE COAX TIP (DISPOSABLE) ×2
HOLDER FOLEY CATH W/STRAP (MISCELLANEOUS) IMPLANT
IMMOBILIZER KNEE 20 (SOFTGOODS) ×2
IMMOBILIZER KNEE 20 THIGH 36 (SOFTGOODS) ×1 IMPLANT
KIT TURNOVER KIT A (KITS) IMPLANT
MANIFOLD NEPTUNE II (INSTRUMENTS) ×2 IMPLANT
NS IRRIG 1000ML POUR BTL (IV SOLUTION) ×2 IMPLANT
PACK TOTAL KNEE CUSTOM (KITS) ×2 IMPLANT
PADDING CAST COTTON 6X4 STRL (CAST SUPPLIES) ×4 IMPLANT
PATELLA DOME PFC 38MM (Knees) ×1 IMPLANT
PIN STEINMAN FIXATION KNEE (PIN) ×1 IMPLANT
PLATE ROT INSERT 10MM SIZE 4 (Plate) ×1 IMPLANT
PROTECTOR NERVE ULNAR (MISCELLANEOUS) ×2 IMPLANT
SET HNDPC FAN SPRY TIP SCT (DISPOSABLE) ×1 IMPLANT
STRIP CLOSURE SKIN 1/2X4 (GAUZE/BANDAGES/DRESSINGS) ×4 IMPLANT
SUT MNCRL AB 4-0 PS2 18 (SUTURE) ×2 IMPLANT
SUT STRATAFIX 0 PDS 27 VIOLET (SUTURE) ×2
SUT VIC AB 2-0 CT1 27 (SUTURE) ×6
SUT VIC AB 2-0 CT1 TAPERPNT 27 (SUTURE) ×3 IMPLANT
SUTURE STRATFX 0 PDS 27 VIOLET (SUTURE) ×1 IMPLANT
TIBIA MBT CEMENT (Knees) ×2 IMPLANT
TRAY FOLEY MTR SLVR 16FR STAT (SET/KITS/TRAYS/PACK) ×2 IMPLANT
WATER STERILE IRR 1000ML POUR (IV SOLUTION) ×4 IMPLANT
WRAP KNEE MAXI GEL POST OP (GAUZE/BANDAGES/DRESSINGS) ×2 IMPLANT
YANKAUER SUCT BULB TIP 10FT TU (MISCELLANEOUS) ×2 IMPLANT

## 2019-07-30 NOTE — Interval H&P Note (Signed)
History and Physical Interval Note:  07/30/2019 8:14 AM  Stephanie [name]  has presented today for surgery, with the diagnosis of right knee osteoarthritis.  The various methods of treatment have been discussed with the patient and family. After consideration of risks, benefits and other options for treatment, the patient has consented to  Procedure(s) with comments: TOTAL KNEE ARTHROPLASTY (Right) - 16min as a surgical intervention.  The patient's history has been reviewed, patient examined, no change in status, stable for surgery.  I have reviewed the patient's chart and labs.  Questions were answered to the patient's satisfaction.     Pilar Plate Jacquees Gongora

## 2019-07-30 NOTE — Anesthesia Postprocedure Evaluation (Signed)
Anesthesia Post Note  Patient: AONESTY SIMSON  Procedure(s) Performed: TOTAL KNEE ARTHROPLASTY (Right Knee)     Patient location during evaluation: PACU Anesthesia Type: Spinal Level of consciousness: oriented and awake and alert Pain management: pain level controlled Vital Signs Assessment: post-procedure vital signs reviewed and stable Respiratory status: spontaneous breathing, respiratory function stable and nonlabored ventilation Cardiovascular status: blood pressure returned to baseline and stable Postop Assessment: no headache, no backache, no apparent nausea or vomiting and spinal receding Anesthetic complications: no    Last Vitals:  Vitals:   07/30/19 1300 07/30/19 1315  BP: (!) 141/82 (!) 151/86  Pulse: 67 72  Resp: 10 13  Temp:    SpO2: 100% 100%    Last Pain:  Vitals:   07/30/19 1300  TempSrc:   PainSc: 0-No pain                 Elpidia Karn A.

## 2019-07-30 NOTE — Discharge Instructions (Addendum)
° °Dr. Frank Aluisio °Total Joint Specialist °[facility] °[address] °[place], [place] [address] °[phone] ° °TOTAL KNEE REPLACEMENT POSTOPERATIVE DIRECTIONS ° °Knee Rehabilitation, Guidelines Following Surgery  °Results after knee surgery are often greatly improved when you follow the exercise, range of motion and muscle strengthening exercises prescribed by your doctor. Safety measures are also important to protect the knee from further injury. Any time any of these exercises cause you to have increased pain or swelling in your knee joint, decrease the amount until you are comfortable again and slowly increase them. If you have problems or questions, call your caregiver or physical therapist for advice.  ° °HOME CARE INSTRUCTIONS  °Remove items at home which could result in a fall. This includes throw rugs or furniture in walking pathways.  °· ICE to the affected knee every three hours for 30 minutes at a time and then as needed for pain and swelling.  Continue to use ice on the knee for pain and swelling from surgery. You may notice swelling that will progress down to the foot and ankle.  This is normal after surgery.  Elevate the leg when you are not up walking on it.   °· Continue to use the breathing machine which will help keep your temperature down.  It is common for your temperature to cycle up and down following surgery, especially at night when you are not up moving around and exerting yourself.  The breathing machine keeps your lungs expanded and your temperature down. °· Do not place pillow under knee, focus on keeping the knee straight while resting ° °DIET °You may resume your previous home diet once your are discharged from the hospital. ° °DRESSING / WOUND CARE / SHOWERING °You may shower 3 days after surgery, but keep the wounds dry during showering.  You may use an occlusive plastic wrap (Press'n Seal for example), NO SOAKING/SUBMERGING IN THE BATHTUB.  If the bandage gets  wet, change with a clean dry gauze.  If the incision gets wet, pat the wound dry with a clean towel. °You may start showering once you are discharged home but do not submerge the incision under water. Just pat the incision dry and apply a dry gauze dressing on daily. °Change the surgical dressing daily and reapply a dry dressing each time. ° °ACTIVITY °Walk with your walker as instructed. °Use walker as long as suggested by your caregivers. °Avoid periods of inactivity such as sitting longer than an hour when not asleep. This helps prevent blood clots.  °You may resume a sexual relationship in one month or when given the OK by your doctor.  °You may return to work once you are cleared by your doctor.  °Do not drive a car for 6 weeks or until released by you surgeon.  °Do not drive while taking narcotics. ° °WEIGHT BEARING °Weight bearing as tolerated with assist device (walker, cane, etc) as directed, use it as long as suggested by your surgeon or therapist, typically at least 4-6 weeks. ° °POSTOPERATIVE CONSTIPATION PROTOCOL °Constipation - defined medically as fewer than three stools per week and severe constipation as less than one stool per week. ° °One of the most common issues patients have following surgery is constipation.  Even if you have a regular bowel pattern at home, your normal regimen is likely to be disrupted due to multiple reasons following surgery.  Combination of anesthesia, postoperative narcotics, change in appetite and fluid intake all can affect your bowels.    In order to avoid complications following surgery, here are some recommendations in order to help you during your recovery period. ° °Colace (docusate) - Pick up an over-the-counter form of Colace or another stool softener and take twice a day as long as you are requiring postoperative pain medications.  Take with a full glass of water daily.  If you experience loose stools or diarrhea, hold the colace until you stool forms back up.  If  your symptoms do not get better within 1 week or if they get worse, check with your doctor. ° °Dulcolax (bisacodyl) - Pick up over-the-counter and take as directed by the product packaging as needed to assist with the movement of your bowels.  Take with a full glass of water.  Use this product as needed if not relieved by Colace only.  ° °MiraLax (polyethylene glycol) - Pick up over-the-counter to have on hand.  MiraLax is a solution that will increase the amount of water in your bowels to assist with bowel movements.  Take as directed and can mix with a glass of water, juice, soda, coffee, or tea.  Take if you go more than two days without a movement. °Do not use MiraLax more than once per day. Call your doctor if you are still constipated or irregular after using this medication for 7 days in a row. ° °If you continue to have problems with postoperative constipation, please contact the office for further assistance and recommendations.  If you experience "the worst abdominal pain ever" or develop nausea or vomiting, please contact the office immediatly for further recommendations for treatment. ° °ITCHING ° If you experience itching with your medications, try taking only a single pain pill, or even half a pain pill at a time.  You can also use Benadryl over the counter for itching or also to help with sleep.  ° °TED HOSE STOCKINGS °Wear the elastic stockings on both legs for three weeks following surgery during the day but you may remove then at night for sleeping. ° °MEDICATIONS °See your medication summary on the “After Visit Summary” that the nursing staff will review with you prior to discharge.  You may have some home medications which will be placed on hold until you complete the course of blood thinner medication.  It is important for you to complete the blood thinner medication as prescribed by your surgeon.  Continue your approved medications as instructed at time of discharge. ° °PRECAUTIONS °If you  experience chest pain or shortness of breath - call 911 immediately for transfer to the hospital emergency department.  °If you develop a fever greater that 101 F, purulent drainage from wound, increased redness or drainage from wound, foul odor from the wound/dressing, or calf pain - CONTACT YOUR SURGEON.   °                                                °FOLLOW-UP APPOINTMENTS °Make sure you keep all of your appointments after your operation with your surgeon and caregivers. You should call the office at the above phone number and make an appointment for approximately two weeks after the date of your surgery or on the date instructed by your surgeon outlined in the "After Visit Summary". ° ° °RANGE OF MOTION AND STRENGTHENING EXERCISES  °Rehabilitation of the knee is important following a knee injury or   an operation. After just a few days of immobilization, the muscles of the thigh which control the knee become weakened and shrink (atrophy). Knee exercises are designed to build up the tone and strength of the thigh muscles and to improve knee motion. Often times heat used for twenty to thirty minutes before working out will loosen up your tissues and help with improving the range of motion but do not use heat for the first two weeks following surgery. These exercises can be done on a training (exercise) mat, on the floor, on a table or on a bed. Use what ever works the best and is most comfortable for you Knee exercises include:  °Leg Lifts - While your knee is still immobilized in a splint or cast, you can do straight leg raises. Lift the leg to 60 degrees, hold for 3 sec, and slowly lower the leg. Repeat 10-20 times 2-3 times daily. Perform this exercise against resistance later as your knee gets better.  °Quad and Hamstring Sets - Tighten up the muscle on the front of the thigh (Quad) and hold for 5-10 sec. Repeat this 10-20 times hourly. Hamstring sets are done by pushing the foot backward against an object  and holding for 5-10 sec. Repeat as with quad sets.  °· Leg Slides: Lying on your back, slowly slide your foot toward your buttocks, bending your knee up off the floor (only go as far as is comfortable). Then slowly slide your foot back down until your leg is flat on the floor again. °· Angel Wings: Lying on your back spread your legs to the side as far apart as you can without causing discomfort.  °A rehabilitation program following serious knee injuries can speed recovery and prevent re-injury in the future due to weakened muscles. Contact your doctor or a physical therapist for more information on knee rehabilitation.  ° °IF YOU ARE TRANSFERRED TO A SKILLED REHAB FACILITY °If the patient is transferred to a skilled rehab facility following release from the hospital, a list of the current medications will be sent to the facility for the patient to continue.  When discharged from the skilled rehab facility, please have the facility set up the patient's Home Health Physical Therapy prior to being released. Also, the skilled facility will be responsible for providing the patient with their medications at time of release from the facility to include their pain medication, the muscle relaxants, and their blood thinner medication. If the patient is still at the rehab facility at time of the two week follow up appointment, the skilled rehab facility will also need to assist the patient in arranging follow up appointment in our office and any transportation needs. ° °MAKE SURE YOU:  °Understand these instructions.  °Get help right away if you are not doing well or get worse.  ° ° °Pick up stool softner and laxative for home use following surgery while on pain medications. °Do not submerge incision under water. °Please use good hand washing techniques while changing dressing each day. °May shower starting three days after surgery. °Please use a clean towel to pat the incision dry following showers. °Continue to use ice for  pain and swelling after surgery. °Do not use any lotions or creams on the incision until instructed by your surgeon. ° °Information on my medicine - XARELTO® (Rivaroxaban) ° ° ° °Why was Xarelto® prescribed for you? °Xarelto® was prescribed for you to reduce the risk of blood clots forming after orthopedic surgery. The medical term for   these abnormal blood clots is venous thromboembolism (VTE). ° °What do you need to know about xarelto® ? °Take your Xarelto® ONCE DAILY at the same time every day. °You may take it either with or without food. ° °If you have difficulty swallowing the tablet whole, you may crush it and mix in applesauce just prior to taking your dose. ° °Take Xarelto® exactly as prescribed by your doctor and DO NOT stop taking Xarelto® without talking to the doctor who prescribed the medication.  Stopping without other VTE prevention medication to take the place of Xarelto® may increase your risk of developing a clot. ° °After discharge, you should have regular check-up appointments with your healthcare provider that is prescribing your Xarelto®.   ° °What do you do if you miss a dose? °If you miss a dose, take it as soon as you remember on the same day then continue your regularly scheduled once daily regimen the next day. Do not take two doses of Xarelto® on the same day.  ° °Important Safety Information °A possible side effect of Xarelto® is bleeding. You should call your healthcare provider right away if you experience any of the following: °? Bleeding from an injury or your nose that does not stop. °? Unusual colored urine (red or dark brown) or unusual colored stools (red or black). °? Unusual bruising for unknown reasons. °? A serious fall or if you hit your head (even if there is no bleeding). ° °Some medicines may interact with Xarelto® and might increase your risk of bleeding while on Xarelto®. To help avoid this, consult your healthcare provider or pharmacist prior to using any new  prescription or non-prescription medications, including herbals, vitamins, non-steroidal anti-inflammatory drugs (NSAIDs) and supplements. ° °This website has more information on Xarelto®: www.xarelto.com. ° ° °

## 2019-07-30 NOTE — Op Note (Signed)
OPERATIVE REPORT-TOTAL KNEE ARTHROPLASTY   Pre-operative diagnosis- Osteoarthritis  Right knee(s)  Post-operative diagnosis- Osteoarthritis Right knee(s)  Procedure-  Right  Total Knee Arthroplasty  Surgeon- Dione Plover. Reichen Hutzler, MD  Assistant- Molli Barrows, PA-C   Anesthesia-  Adductor canal block and spinal  EBL- 25 ml   Drains Hemovac  Tourniquet time-  Total Tourniquet Time Documented: Thigh (Right) - 41 minutes Total: Thigh (Right) - 41 minutes     Complications- None  Condition-PACU - hemodynamically stable.   Brief Clinical Note  Stephanie [name] is a 85 y.o. year old female with end stage OA of her right knee with progressively worsening pain and dysfunction. She has constant pain, with activity and at rest and significant functional deficits with difficulties even with ADLs. She has had extensive non-op management including analgesics, injections of cortisone and viscosupplements, and home exercise program, but remains in significant pain with significant dysfunction.Radiographs show bone on bone arthritis lateral and patellofemoral. She presents now for right Total Knee Arthroplasty.    Procedure in detail---   The patient is brought into the operating room and positioned supine on the operating table. After successful administration of  Adductor canal block and spinal,   a tourniquet is placed high on the  Right thigh(s) and the lower extremity is prepped and draped in the usual sterile fashion. Time out is performed by the operating team and then the  Right lower extremity is wrapped in Esmarch, knee flexed and the tourniquet inflated to 300 mmHg.       A midline incision is made with a ten blade through the subcutaneous tissue to the level of the extensor mechanism. A fresh blade is used to make a medial parapatellar arthrotomy. Soft tissue over the proximal medial tibia is subperiosteally elevated to the joint line with a knife and into the semimembranosus bursa with a  Cobb elevator. Soft tissue over the proximal lateral tibia is elevated with attention being paid to avoiding the patellar tendon on the tibial tubercle. The patella is everted, knee flexed 90 degrees and the ACL and PCL are removed. Findings are bone on bone lateral and patellofemoral with large global osteophytes and a massive Baker's cyst.        The drill is used to create a starting hole in the distal femur and the canal is thoroughly irrigated with sterile saline to remove the fatty contents. The 5 degree Right  valgus alignment guide is placed into the femoral canal and the distal femoral cutting block is pinned to remove 10 mm off the distal femur. Resection is made with an oscillating saw.      The tibia is subluxed forward and the menisci are removed. The extramedullary alignment guide is placed referencing proximally at the medial aspect of the tibial tubercle and distally along the second metatarsal axis and tibial crest. The block is pinned to remove 7mm off the more deficient lateral  side. Resection is made with an oscillating saw. Size 3is the most appropriate size for the tibia and the proximal tibia is prepared with the modular drill and keel punch for that size.      The femoral sizing guide is placed and size 4 is most appropriate. Rotation is marked off the epicondylar axis and confirmed by creating a rectangular flexion gap at 90 degrees. The size 4 cutting block is pinned in this rotation and the anterior, posterior and chamfer cuts are made with the oscillating saw. The intercondylar block is then placed and  that cut is made.      Trial size 3 tibial component, trial size 4 narrow posterior stabilized femur and a 10  mm posterior stabilized rotating platform insert trial is placed. Full extension is achieved with excellent varus/valgus and anterior/posterior balance throughout full range of motion. The patella is everted and thickness measured to be 22  mm. Free hand resection is taken to  12 mm, a 38 template is placed, lug holes are drilled, trial patella is placed, and it tracks normally. Osteophytes are removed off the posterior femur with the trial in place.I aspirated the cyst through the joint and 60 ml of gelatinous fluid was removed consistent with a ganglion cyst. I palpated posteriorly and it was completely decompressed.  All trials are removed and the cut bone surfaces prepared with pulsatile lavage. Cement is mixed and once ready for implantation, the size 3 tibial implant, size  4 narrow posterior stabilized femoral component, and the size 38 patella are cemented in place and the patella is held with the clamp. The trial insert is placed and the knee held in full extension. The Exparel (20 ml mixed with 60 ml saline) is injected into the extensor mechanism, posterior capsule, medial and lateral gutters and subcutaneous tissues.  All extruded cement is removed and once the cement is hard the permanent 10 mm posterior stabilized rotating platform insert is placed into the tibial tray.      The wound is copiously irrigated with saline solution and the extensor mechanism closed over a hemovac drain with #1 V-loc suture. The tourniquet is released for a total tourniquet time of 41  minutes. Flexion against gravity is 140 degrees and the patella tracks normally. Subcutaneous tissue is closed with 2.0 vicryl and subcuticular with running 4.0 Monocryl. The incision is cleaned and dried and steri-strips and a bulky sterile dressing are applied. The limb is placed into a knee immobilizer and the patient is awakened and transported to recovery in stable condition.      Please note that a surgical assistant was a medical necessity for this procedure in order to perform it in a safe and expeditious manner. Surgical assistant was necessary to retract the ligaments and vital neurovascular structures to prevent injury to them and also necessary for proper positioning of the limb to allow for anatomic  placement of the prosthesis.   Dione Plover Gittel Mccamish, MD    07/30/2019, 11:17 AM

## 2019-07-30 NOTE — Anesthesia Procedure Notes (Signed)
Anesthesia Regional Block: Adductor canal block   Pre-Anesthetic Checklist: ,, timeout performed, Correct Patient, Correct Site, Correct Laterality, Correct Procedure, Correct Position, site marked, Risks and benefits discussed,  Surgical consent,  Pre-op evaluation,  At surgeon's request and post-op pain management  Laterality: Right  Prep: chloraprep       Needles:  Injection technique: Single-shot  Needle Type: Echogenic Stimulator Needle     Needle Length: 9cm  Needle Gauge: 21   Needle insertion depth: 5 cm   Additional Needles:   Procedures:,,,, ultrasound used (permanent image in chart),,,,  Narrative:  Start time: 07/30/2019 9:18 AM End time: 07/30/2019 9:22 AM Injection made incrementally with aspirations every 5 mL.  Performed by: Personally  Anesthesiologist: Josephine Igo, MD  Additional Notes: Timeout performed. Patient sedated. Relevant anatomy ID'd using [place]. Incremental 2-75ml injection of LA with frequent aspiration. Patient tolerated procedure well.        Right Adductor Canal Block

## 2019-07-30 NOTE — Evaluation (Signed)
Physical Therapy Evaluation Patient Details Name: Stephanie [name] MRN: [id number] DOB: 02/05/1932 Today's Date: 07/30/2019   History of Present Illness  Pt is s/p Rt TKA on 07/30/19 with PMH of Bil THR, cervical fusion, parkinsons, bladder cancer, HTN.  Clinical Impression  Stephanie [name] is a 85 y.o. female POD 0 s/p Rt TKA with above PMH. She reports being independent at baseline and currently limited by functional impairments (see PT problem list). She was able to perform bed mobility and transfer today but unable to progress to ambulation due to poor Rt LE activation. She will benefit from skilled PT interventions to at follow up venue to address impairments and progress towards PLOF. Acute PT will follow to progress as able and provide mobility training in preparation for safe discharge home.    Follow Up Recommendations Follow surgeon's recommendation for DC plan and follow-up therapies    Equipment Recommendations  None recommended by PT    Recommendations for Other Services       Precautions / Restrictions Precautions Precautions: Fall Restrictions Weight Bearing Restrictions: No      Mobility  Bed Mobility Overal bed mobility: Needs Assistance Bed Mobility: Supine to Sit     Supine to sit: Min assist     General bed mobility comments: cues for sequenicng and min assist to bring LE's off of the bed  Transfers Overall transfer level: Needs assistance Equipment used: Rolling walker (2 wheeled) Transfers: Sit to/from [facility] Sit to Stand: Min assist Stand pivot transfers: Min assist       General transfer comment: Pt required min assist to initiate power up and steady in standing, cues requried for safe hand placement. Min assist for stand step to trasnfer from bed to chair with RW, cues to [place] Rt LE by pressing through UE's in RW more.  Ambulation/Gait             General Gait Details: deferred secondary to poor Rt LE muscle  activation  Stairs            Wheelchair Mobility    Modified Rankin (Stroke Patients Only)       Balance Overall balance assessment: Needs assistance Sitting-balance support: No upper extremity supported;Feet supported Sitting balance-Leahy Scale: Good       Standing balance-Leahy Scale: Poor Standing balance comment: pt requires UE support for all standing              Pertinent Vitals/Pain Pain Assessment: Faces Faces Pain Scale: Hurts little more Pain Location: Rt knee Pain Descriptors / Indicators: Aching;Sore Pain Intervention(s): Limited activity within patient's tolerance;Monitored during session;Ice applied    Home Living Family/patient expects to be discharged to:: Private residence Living Arrangements: Alone Available Help at Discharge: Family;Friend(s) Type of Home: House Home Access: Stairs to enter Entrance Stairs-Rails: Chemical engineer of Steps: 4 Home Layout: One level Home Equipment: Grab bars - toilet;Grab bars - tub/shower;Shower seat;Toilet riser;Walker - 2 wheels Additional Comments: pt's daughter and a friend are going to stay with patient following surgery    Prior Function Level of Independence: Independent               Hand Dominance   Dominant Hand: Right    Extremity/Trunk Assessment   Upper Extremity Assessment Upper Extremity Assessment: Overall WFL for tasks assessed    Lower Extremity Assessment Lower Extremity Assessment: Generalized weakness;RLE deficits/detail;LLE deficits/detail RLE Deficits / Details: poor quad activation in supine, Rt LE buckling in knee immobilizer in standing RLE  Coordination: decreased gross motor       Communication   Communication: HOH  Cognition Arousal/Alertness: Awake/alert Behavior During Therapy: WFL for tasks assessed/performed Overall Cognitive Status: Within Functional Limits for tasks assessed            General Comments      Exercises Total  Joint Exercises Ankle Circles/Pumps: AROM;Both;10 reps;Seated Quad Sets: AROM;Right;5 reps;Seated(LE's elevated with leg rest) Heel Slides: Seated;Right;5 reps;AAROM(LE's elevated with leg rest, gait belt for assist) Long Arc Quad: AROM;Left;5 reps;Seated   Assessment/Plan    PT Assessment Patient needs continued PT services  PT Problem List Decreased strength;Decreased range of motion;Decreased balance;Decreased activity tolerance;Decreased mobility;Impaired sensation;Pain;Decreased knowledge of use of DME       PT Treatment Interventions DME instruction;Stair training;Therapeutic activities;Balance training;Modalities;Manual techniques;Patient/family education;Neuromuscular re-education;Therapeutic exercise;Functional mobility training;Gait training    PT Goals (Current goals can be found in the Care Plan section)  Acute Rehab PT Goals Patient Stated Goal: pt wants to work on walking PT Goal Formulation: With patient Time For Goal Achievement: 08/06/19 Potential to Achieve Goals: Good    Frequency 7X/week    AM-PAC PT "6 Clicks" Mobility  Outcome Measure Help needed turning from your back to your side while in a flat bed without using bedrails?: A Little Help needed moving from lying on your back to sitting on the side of a flat bed without using bedrails?: A Little Help needed moving to and from a bed to a chair (including a wheelchair)?: A Little Help needed standing up from a chair using your arms (e.g., wheelchair or bedside chair)?: A Lot Help needed to walk in hospital room?: A Lot Help needed climbing 3-5 steps with a railing? : A Lot 6 Click Score: 15    End of Session Equipment Utilized During Treatment: Gait belt Activity Tolerance: Patient tolerated treatment well Patient left: in chair;with chair alarm set;with call bell/phone within reach Nurse Communication: Mobility status PT Visit Diagnosis: Unsteadiness on feet (R26.81);Difficulty in walking, not elsewhere  classified (R26.2);Other abnormalities of gait and mobility (R26.89);Muscle weakness (generalized) (M62.81);Pain Pain - Right/Left: Right Pain - part of body: Knee    Time: 1730-1755 PT Time Calculation (min) (ACUTE ONLY): 25 min   Charges:   PT Evaluation $PT Eval Low Complexity: 1 Low PT Treatments $Therapeutic Exercise: 8-22 mins        Kipp Brood, PT, DPT, [facility] Physical Therapist with [facility]  07/30/2019 8:01 PM

## 2019-07-30 NOTE — Care Plan (Signed)
Ortho Bundle Case Management Note  Patient Details  Name: Stephanie [name] MRN: [id number] Date of Birth: December 29, 1931  R TKA on 07-30-19 DCP:  Home with dtr and friend.  1 story home with 4 ste.   DME:  No needs. Has a RW and toilet seat elevator. PT:  [facility].  PT eval scheduled on 08-03-19.                   DME Arranged:  N/A DME Agency:  NA  HH Arranged:  NA HH Agency:  NA  Additional Comments: Please contact me with any questions of if this plan should need to change.  Marianne Sofia, RN,CCM [facility]  [phone] 07/30/2019, 4:15 PM

## 2019-07-30 NOTE — Anesthesia Procedure Notes (Signed)
Spinal  Patient location during procedure: OR Start time: 07/30/2019 10:06 AM End time: 07/30/2019 10:10 AM Reason for block: at surgeon's request Staffing Resident/CRNA: Anne Fu, CRNA Performed: resident/CRNA  Preanesthetic Checklist Completed: patient identified, site marked, surgical consent, pre-op evaluation, timeout performed, IV checked, risks and benefits discussed and monitors and equipment checked Spinal Block Patient position: sitting Prep: DuraPrep Patient monitoring: heart rate, continuous pulse ox and blood pressure Approach: right paramedian Location: L2-3 Injection technique: single-shot Needle Needle type: Pencan  Needle gauge: 24 G Needle length: 9 cm Assessment Sensory level: T6 Additional Notes  Functioning IV was confirmed and monitors were applied. Expiration date of kit checked and confirmed. Sterile prep and drape, including hand hygiene and sterile gloves were used. The patient was positioned and the spine was prepped. The skin was anesthetized with lidocaine.  Free flow of clear CSF was obtained prior to injecting local anesthetic into the CSF X 1 attempt.  The spinal needle aspirated freely following injection.  The needle was carefully withdrawn. Patient tolerated procedure well, without complications. Loss of motor and sensory on exam post injection.

## 2019-07-30 NOTE — Transfer of Care (Signed)
Immediate Anesthesia Transfer of Care Note  Patient: Stephanie [name]  Procedure(s) Performed: Procedure(s) with comments: TOTAL KNEE ARTHROPLASTY (Right) - 71min  Patient Location: PACU  Anesthesia Type:Spinal  Level of Consciousness:  sedated, patient cooperative and responds to stimulation  Airway & Oxygen Therapy:Patient Spontanous Breathing and Patient connected to face mask oxgen  Post-op Assessment:  Report given to PACU RN and Post -op Vital signs reviewed and stable  Post vital signs:  Reviewed and stable  Last Vitals:  Vitals:   07/30/19 0943 07/30/19 0948  BP: (!) 153/87   Pulse: 77 73  Resp: 19 14  Temp:    SpO2: [id number] [id number]    Complications: No apparent anesthesia complications

## 2019-07-30 NOTE — Anesthesia Preprocedure Evaluation (Signed)
Anesthesia Evaluation  Patient identified by MRN, date of birth, ID band Patient awake    Reviewed: Allergy & Precautions, NPO status , Patient's Chart, lab work & pertinent test results  Airway Mallampati: II  TM Distance: >3 FB Neck ROM: Full    Dental no notable dental hx. (+) Teeth Intact   Pulmonary former smoker,    Pulmonary exam normal breath sounds clear to auscultation       Cardiovascular hypertension, Pt. on medications Normal cardiovascular exam Rhythm:Regular Rate:Normal     Neuro/Psych PSYCHIATRIC DISORDERS Anxiety Depression Parkinson's disease  Neuromuscular disease    GI/Hepatic negative GI ROS, Neg liver ROS,   Endo/Other  Hyperlipidemia  Renal/GU negative Renal ROS   Hx/o Bladder Ca    Musculoskeletal  (+) Arthritis , Osteoarthritis,    Abdominal   Peds  Hematology  (+) anemia ,   Anesthesia Other Findings   Reproductive/Obstetrics                             Anesthesia Physical Anesthesia Plan  ASA: III  Anesthesia Plan: Spinal   Post-op Pain Management:  Regional for Post-op pain   Induction:   PONV Risk Score and Plan: 3 and Ondansetron, Propofol infusion and Treatment may vary due to age or medical condition  Airway Management Planned: Natural Airway and Simple Face Mask  Additional Equipment:   Intra-op Plan:   Post-operative Plan:   Informed Consent: I have reviewed the patients History and Physical, chart, labs and discussed the procedure including the risks, benefits and alternatives for the proposed anesthesia with the patient or authorized representative who has indicated his/her understanding and acceptance.       Plan Discussed with: CRNA and Surgeon  Anesthesia Plan Comments:         Anesthesia Quick Evaluation

## 2019-07-30 NOTE — Progress Notes (Signed)
AssistedDr. Foster with right, ultrasound guided, adductor canal block. Side rails up, monitors on throughout procedure. See vital signs in flow sheet. Tolerated Procedure well.  

## 2019-07-31 ENCOUNTER — Encounter (HOSPITAL_COMMUNITY): Payer: Self-pay | Admitting: Orthopedic Surgery

## 2019-07-31 DIAGNOSIS — Z8551 Personal history of malignant neoplasm of bladder: Secondary | ICD-10-CM | POA: Diagnosis not present

## 2019-07-31 DIAGNOSIS — M1711 Unilateral primary osteoarthritis, right knee: Secondary | ICD-10-CM | POA: Diagnosis not present

## 2019-07-31 DIAGNOSIS — L4050 Arthropathic psoriasis, unspecified: Secondary | ICD-10-CM | POA: Diagnosis not present

## 2019-07-31 DIAGNOSIS — G20 Parkinson's disease: Secondary | ICD-10-CM | POA: Diagnosis not present

## 2019-07-31 DIAGNOSIS — I10 Essential (primary) hypertension: Secondary | ICD-10-CM | POA: Diagnosis not present

## 2019-07-31 DIAGNOSIS — M48061 Spinal stenosis, lumbar region without neurogenic claudication: Secondary | ICD-10-CM | POA: Diagnosis not present

## 2019-07-31 LAB — BASIC METABOLIC PANEL
Anion gap: 10 (ref 5–15)
BUN: 11 mg/dL (ref 8–23)
CO2: 28 mmol/L (ref 22–32)
Calcium: 8.5 mg/dL — ABNORMAL LOW (ref 8.9–10.3)
Chloride: 97 mmol/L — ABNORMAL LOW (ref 98–111)
Creatinine, Ser: 0.57 mg/dL (ref 0.44–1.00)
GFR calc Af Amer: >60 mL/min (ref >60)
GFR calc non Af Amer: >60 mL/min (ref >60)
Glucose, Bld: 131 mg/dL — ABNORMAL HIGH (ref 70–99)
Potassium: 3.8 mmol/L (ref 3.5–5.1)
Sodium: 135 mmol/L (ref 135–145)

## 2019-07-31 LAB — CBC
HCT: 33 % — ABNORMAL LOW (ref 36.0–46.0)
Hemoglobin: 11 g/dL — ABNORMAL LOW (ref 12.0–15.0)
MCH: 31.2 pg (ref 26.0–34.0)
MCHC: 33.3 g/dL (ref 30.0–36.0)
MCV: 93.5 fL (ref 80.0–100.0)
Platelets: 152 10*3/uL (ref 150–400)
RBC: 3.53 MIL/uL — ABNORMAL LOW (ref 3.87–5.11)
RDW: 12.7 % (ref 11.5–15.5)
WBC: 9.6 10*3/uL (ref 4.0–10.5)
nRBC: 0 % (ref 0.0–0.2)

## 2019-07-31 MED ORDER — NALOXEGOL OXALATE 12.5 MG PO TABS: 12.5000 mg | ORAL_TABLET | Freq: Every day | ORAL | 0 refills | Status: DC

## 2019-07-31 MED ORDER — GABAPENTIN 100 MG PO CAPS: 100.0000 mg | ORAL_CAPSULE | Freq: Two times a day (BID) | ORAL | 0 refills | Status: DC

## 2019-07-31 MED ORDER — RIVAROXABAN 10 MG PO TABS: 10.0000 mg | ORAL_TABLET | Freq: Every day | ORAL | 0 refills | Status: DC

## 2019-07-31 MED ORDER — OXYCODONE HCL 5 MG PO TABS: 5.0000 mg | ORAL_TABLET | Freq: Four times a day (QID) | ORAL | 0 refills | Status: DC | PRN

## 2019-07-31 MED ORDER — METHOCARBAMOL 500 MG PO TABS: 500.0000 mg | ORAL_TABLET | Freq: Four times a day (QID) | ORAL | 0 refills | Status: DC | PRN

## 2019-07-31 NOTE — Progress Notes (Signed)
Physical Therapy Treatment Patient Details Name: Stephanie [name] MRN: [id number] DOB: July 05, 1932 Today's Date: 07/31/2019    History of Present Illness Pt is s/p Rt TKA on 07/30/19 with PMH of Bil THR, cervical fusion, parkinsons, bladder cancer, HTN.    PT Comments    Pt ambulated in hallway and performed LE exercises.  Will return to practice safe stair technique.  Pt will likely d/c home later today.      Follow Up Recommendations  Follow surgeon's recommendation for DC plan and follow-up therapies     Equipment Recommendations  None recommended by PT    Recommendations for Other Services       Precautions / Restrictions Precautions Precautions: Fall;Knee Restrictions Weight Bearing Restrictions: No    Mobility  Bed Mobility Overal bed mobility: Needs Assistance Bed Mobility: Supine to Sit     Supine to sit: Min guard     General bed mobility comments: min/guard for safety  Transfers Overall transfer level: Needs assistance Equipment used: Rolling walker (2 wheeled) Transfers: Sit to/from Stand Sit to Stand: Min guard         General transfer comment: verbal cues for UE and LE positioning, min/guard for safety  Ambulation/Gait Ambulation/Gait assistance: Min guard Gait Distance (Feet): 120 Feet Assistive device: Rolling walker (2 wheeled) Gait Pattern/deviations: Step-to pattern;Decreased stance time - right;Antalgic     General Gait Details: verbal cues for sequence, RW positioning, heel strike, and step length   Stairs             Wheelchair Mobility    Modified Rankin (Stroke Patients Only)       Balance                                            Cognition Arousal/Alertness: Awake/alert Behavior During Therapy: WFL for tasks assessed/performed Overall Cognitive Status: Within Functional Limits for tasks assessed                                        Exercises Total Joint Exercises Ankle  Circles/Pumps: AROM;10 reps;Both Quad Sets: AROM;Both;10 reps Short Arc Quad: AROM;Right;10 reps Heel Slides: AAROM;10 reps;Right Hip ABduction/ADduction: AROM;Right;10 reps Straight Leg Raises: AROM;Right;10 reps    General Comments        Pertinent Vitals/Pain Pain Assessment: 0-10 Pain Score: 4  Pain Location: Rt knee Pain Descriptors / Indicators: Aching;Sore Pain Intervention(s): Limited activity within patient's tolerance;Monitored during session;Repositioned;Ice applied    Home Living                      Prior Function            PT Goals (current goals can now be found in the care plan section) Progress towards PT goals: Progressing toward goals    Frequency    7X/week      PT Plan Current plan remains appropriate    Co-evaluation              AM-PAC PT "6 Clicks" Mobility   Outcome Measure  Help needed turning from your back to your side while in a flat bed without using bedrails?: A Little Help needed moving from lying on your back to sitting on the side of a flat bed without using bedrails?: A Little Help  needed moving to and from a bed to a chair (including a wheelchair)?: A Little Help needed standing up from a chair using your arms (e.g., wheelchair or bedside chair)?: A Little Help needed to walk in hospital room?: A Little Help needed climbing 3-5 steps with a railing? : A Little 6 Click Score: 18    End of Session Equipment Utilized During Treatment: Gait belt Activity Tolerance: Patient tolerated treatment well Patient left: with call bell/phone within reach;in chair;with chair alarm set Nurse Communication: Mobility status PT Visit Diagnosis: Difficulty in walking, not elsewhere classified (R26.2);Muscle weakness (generalized) (M62.81)     Time: [id number] PT Time Calculation (min) (ACUTE ONLY): 18 min  Charges:  $Therapeutic Exercise: 8-22 mins                     Carmelia Bake, PT, DPT Acute Rehabilitation  Services Office: [phone] Pager: [phone]  Trena Platt 07/31/2019, 1:06 PM

## 2019-07-31 NOTE — Care Management CC44 (Signed)
Condition Code 44 Documentation Completed  Patient Details  Name: DOMINIGUE KENISON MRN: [id number] Date of Birth: 08/05/1932   Condition Code 44 given:  Yes Patient signature on Condition Code 44 notice:  (patient declined to sign) Documentation of 2 MD's agreement:  Yes Code 44 added to claim:  Yes    Joaquin Courts, RN 07/31/2019, 10:24 AM

## 2019-07-31 NOTE — Progress Notes (Signed)
Physical Therapy Treatment Patient Details Name: Stephanie [name] MRN: [id number] DOB: 1932/03/01 Today's Date: 07/31/2019    History of Present Illness Pt is s/p Rt TKA on 07/30/19 with PMH of Bil THR, cervical fusion, parkinsons, bladder cancer, HTN.    PT Comments    Pt ambulated again in hallway good distance and then practiced safe stair technique.  Pt provided with HEP handout and verbally reviewed.  Pt had no further questions and feels ready for d/c home today.    Follow Up Recommendations  Follow surgeon's recommendation for DC plan and follow-up therapies     Equipment Recommendations  None recommended by PT    Recommendations for Other Services       Precautions / Restrictions Precautions Precautions: Fall;Knee Restrictions Weight Bearing Restrictions: No    Mobility  Bed Mobility Overal bed mobility: Needs Assistance Bed Mobility: Supine to Sit;Sit to Supine     Supine to sit: Supervision Sit to supine: Supervision   General bed mobility comments: min/guard for safety  Transfers Overall transfer level: Needs assistance Equipment used: Rolling walker (2 wheeled) Transfers: Sit to/from Stand Sit to Stand: Supervision         General transfer comment: verbal cues for UE and LE positioning  Ambulation/Gait Ambulation/Gait assistance: Supervision Gait Distance (Feet): 280 Feet Assistive device: Rolling walker (2 wheeled) Gait Pattern/deviations: Step-to pattern;Decreased stance time - right;Antalgic;Step-through pattern     General Gait Details: verbal cues for sequence, RW positioning, heel strike, and step length   Stairs Stairs: Yes Stairs assistance: Min guard Stair Management: Step to pattern;Forwards;Two rails Number of Stairs: 3 General stair comments: verbal cues for safety and sequence; pt performed twice and reports understanding   Wheelchair Mobility    Modified Rankin (Stroke Patients Only)       Balance                                             Cognition Arousal/Alertness: Awake/alert Behavior During Therapy: WFL for tasks assessed/performed Overall Cognitive Status: Within Functional Limits for tasks assessed                                           General Comments        Pertinent Vitals/Pain Pain Assessment: 0-10 Pain Score: 4  Pain Location: Rt knee Pain Descriptors / Indicators: Aching;Sore Pain Intervention(s): Repositioned;Monitored during session    Home Living                      Prior Function            PT Goals (current goals can now be found in the care plan section) Progress towards PT goals: Progressing toward goals    Frequency    7X/week      PT Plan Current plan remains appropriate    Co-evaluation              AM-PAC PT "6 Clicks" Mobility   Outcome Measure  Help needed turning from your back to your side while in a flat bed without using bedrails?: A Little Help needed moving from lying on your back to sitting on the side of a flat bed without using bedrails?: A Little Help needed moving to and from a  bed to a chair (including a wheelchair)?: A Little Help needed standing up from a chair using your arms (e.g., wheelchair or bedside chair)?: A Little Help needed to walk in hospital room?: A Little Help needed climbing 3-5 steps with a railing? : A Little 6 Click Score: 18    End of Session Equipment Utilized During Treatment: Gait belt Activity Tolerance: Patient tolerated treatment well Patient left: with call bell/phone within reach;in bed;with bed alarm set Nurse Communication: Mobility status PT Visit Diagnosis: Difficulty in walking, not elsewhere classified (R26.2);Muscle weakness (generalized) (M62.81)     Time: [id number] PT Time Calculation (min) (ACUTE ONLY): 17 min  Charges:  $Gait Training: 8-22 mins          Carmelia Bake, PT, DPT Acute Rehabilitation Services Office:  [phone] Pager: [phone]   Trena Platt 07/31/2019, 3:34 PM

## 2019-07-31 NOTE — Plan of Care (Signed)
  Problem: Education: Goal: Knowledge of the prescribed therapeutic regimen will improve Outcome: Progressing   Problem: Activity: Goal: Range of joint motion will improve Outcome: Progressing   Problem: Pain Management: Goal: Pain level will decrease with appropriate interventions Outcome: Progressing   

## 2019-07-31 NOTE — Care Management Obs Status (Signed)
MEDICARE OBSERVATION STATUS NOTIFICATION   Patient Details  Name: RIKA BEAM MRN: [id number] Date of Birth: July 02, 1932   Medicare Observation Status Notification Given:  Yes    Joaquin Courts, RN 07/31/2019, 10:24 AM

## 2019-07-31 NOTE — TOC Initial Note (Signed)
Transition of Care [facility]) - Initial/Assessment Note    Patient Details  Name: Stephanie [name] MRN: [id number] Date of Birth: 05/14/1932  Transition of Care [facility]) CM/SW Contact:    Joaquin Courts, RN Phone Number: 07/31/2019, 10:52 AM  Clinical Narrative:     CM spoke with patient at bedside, patient plans to discharge home with OPPT. Patient reports she has a rolling walker at home and declines 3-in-1.                Expected Discharge Plan: Home/Self Care Barriers to Discharge: No Barriers Identified   Patient Goals and CMS Choice Patient states their goals for this hospitalization and ongoing recovery are:: to go homw      Expected Discharge Plan and Services Expected Discharge Plan: Home/Self Care   Discharge Planning Services: CM Consult   Living arrangements for the past 2 months: Single Family Home Expected Discharge Date: 07/31/19               DME Arranged: N/A DME Agency: NA       HH Arranged: NA [place] Agency: NA        Prior Living Arrangements/Services Living arrangements for the past 2 months: Single Family Home Lives with:: Self Patient language and need for interpreter reviewed:: Yes Do you feel safe going back to the place where you live?: Yes      Need for Family Participation in Patient Care: Yes (Comment) Care giver support system in place?: Yes (comment)   Criminal Activity/Legal Involvement Pertinent to Current Situation/Hospitalization: No - Comment as needed  Activities of Daily Living Home Assistive Devices/Equipment: Eyeglasses, Hearing aid, Walker (specify type)(front wheel) ADL Screening (condition at time of admission) Patient's cognitive ability adequate to safely complete daily activities?: Yes Is the patient deaf or have difficulty hearing?: Yes Does the patient have difficulty seeing, even when wearing glasses/contacts?: No Does the patient have difficulty concentrating, remembering, or making decisions?: No Patient able to  express need for assistance with ADLs?: Yes Does the patient have difficulty dressing or bathing?: No Independently performs ADLs?: Yes (appropriate for developmental age) Does the patient have difficulty walking or climbing stairs?: No Weakness of Legs: Right Weakness of Arms/Hands: Left  Permission Sought/Granted                  Emotional Assessment Appearance:: Appears stated age Attitude/Demeanor/Rapport: Engaged Affect (typically observed): Accepting Orientation: : Oriented to Self, Oriented to Place, Oriented to  Time, Oriented to Situation   Psych Involvement: No (comment)  Admission diagnosis:  right knee osteoarthritis Patient Active Problem List   Diagnosis Date Noted  . OA (osteoarthritis) of knee 07/30/2019  . Tinea corporis 06/08/2019  . Psoriatic arthritis ([place]) 04/02/2019  . Anemia 03/13/2018  . Hot flashes 03/13/2018  . S/P cervical spinal fusion 03/31/2017  . Neck pain 12/12/2016  . Depression with anxiety 10/17/2016  . Bladder cancer ([place]) 10/17/2016  . OA (osteoarthritis) of hip 07/07/2016  . Spinal stenosis of lumbar region with radiculopathy 04/08/2016  . Spondylolisthesis of lumbar region 04/08/2016  . Spondylosis of cervical region without myelopathy or radiculopathy 04/08/2016  . Preventative health care 01/18/2016  . Low back pain 01/18/2016  . History of chicken pox   . Hyperlipidemia 12/15/2014  . Allergic rhinitis 06/25/2014  . Allergy to bee sting 06/10/2014  . TMJ tenderness 11/16/2013  . Other malaise and fatigue 05/07/2013  . Arthropathy of cervical spine ([place]) 11/27/2012  . Muscle spasms of neck 11/27/2012  . Parkinson's  disease ([place]) 05/24/2012  . Conjunctivochalasis 03/09/2012  . Epiretinal membrane 03/09/2012  . Hyperopia with astigmatism and presbyopia 03/09/2012  . Pseudophakia 03/09/2012  . HTN (hypertension) 08/31/2011  . Right knee pain 08/31/2011  . Benign hypertensive heart disease without heart failure 05/12/2011    PCP:  Mosie Lukes, MD Pharmacy:   [id number] - [place], [address] [place] [address] Phone: [phone] Fax: [phone]     Social Determinants of Health (SDOH) Interventions    Readmission Risk Interventions No flowsheet data found.

## 2019-07-31 NOTE — Progress Notes (Signed)
Subjective: 1 Day Post-Op Procedure(s) (LRB): TOTAL KNEE ARTHROPLASTY (Right) Patient reports pain as mild.   Patient seen in rounds by Dr. Wynelle Link. Patient is well, and has had no acute complaints or problems other than discomfort int he right knee. No acute events overnight. Foley catheter removed. Patient states she is ready to go home today.  We will continue therapy today.   Objective: Vital signs in last 24 hours: Temp:  [97.4 F (36.3 C)-98.5 F (36.9 C)] 97.8 F (36.6 C) (09/15 0516) Pulse Rate:  [65-89] 65 (09/15 0516) Resp:  [10-19] 14 (09/15 0516) BP: (92-166)/(55-92) 132/85 (09/15 0516) SpO2:  [96 %-100 %] 100 % (09/15 0516) Weight:  [56.7 kg] 56.7 kg (09/14 0838)  Intake/Output from previous day:  Intake/Output Summary (Last 24 hours) at 07/31/2019 0723 Last data filed at 07/31/2019 0524 Gross per 24 hour  Intake 2736.33 ml  Output 3000 ml  Net -263.67 ml     Intake/Output this shift: No intake/output data recorded.  Labs: Recent Labs    07/31/19 0307  HGB 11.0*   Recent Labs    07/31/19 0307  WBC 9.6  RBC 3.53*  HCT 33.0*  PLT 152   Recent Labs    07/31/19 0307  NA 135  K 3.8  CL 97*  CO2 28  BUN 11  CREATININE 0.57  GLUCOSE 131*  CALCIUM 8.5*   No results for input(s): LABPT, INR in the last 72 hours.  Exam: General - Patient is Alert and Oriented Extremity - Neurologically intact Sensation intact distally Intact pulses distally Dorsiflexion/Plantar flexion intact Dressing - dressing C/D/I Motor Function - intact, moving foot and toes well on exam.   Past Medical History:  Diagnosis Date  . Arthropathy of cervical spine 11/27/2012   Right  Xray at [facility]  On 04/08/2016  AP, lateral, and lateral flexion and extension views of the cervical spine are submitted for evaluation.   No acute fracture identified in the cervical spine.   There is redemonstration of approximately 2 mm of C3 on C4 anterolisthesis in neutral which slightly  increases in flexion relative to neutral and extension. Slight C5 on C6 retrolisthesis does not change i  . Benign hypertension without congestive heart failure   . Benign mole    right hcets mole, bleeds when dried with towel  . Bilateral dry eyes   . Bladder cancer ([place])   . Bladder cancer ([place]) 11/03/16  . Cervical spondylosis   . Cervicalgia    2 screws in place   . Depression with anxiety 2016/11/03   prior to husbands death   . Gross hematuria    developed after first hip replacment, led to indicental finding of bladder tumor, bleeding now resolved   . History of kidney stones    1970's  . Left hand weakness    due to reinjury of flxor tendon s/p tendon surgery march 12-2018  . Lumbar stenosis   . Malignant tumor of urinary bladder ([place]) 07/2016   Pre-stage I  . OA (osteoarthritis)   . Parkinson disease [facility]) dx 2012   neurologist-  dr siddiqui at [facility]  . Psoriatic arthritis [facility])     Dr Trudie Reed , [place] [facility]   . Rupture of flexor tendon of hand 2020   right    Assessment/Plan: 1 Day Post-Op Procedure(s) (LRB): TOTAL KNEE ARTHROPLASTY (Right) Principal Problem:   OA (osteoarthritis) of knee Active Problems:   OA (osteoarthritis) of hip  Estimated body mass index is 22.14  kg/m as calculated from the following:   Height as of this encounter: 5\' 3"  (1.6 m).   Weight as of this encounter: 56.7 kg. Advance diet Up with therapy D/C IV fluids   Patient's anticipated LOS is less than 2 midnights, meeting these requirements: - Lives within 1 hour of care - Has a competent adult at home to recover with post-op recover - NO history of  - Chronic pain requiring opiods  - Diabetes  - Coronary Artery Disease  - Heart failure  - Heart attack  - Stroke  - DVT/VTE  - Cardiac arrhythmia  - Respiratory Failure/COPD  - Renal failure  - Anemia  - Advanced Liver disease  DVT Prophylaxis - Xarelto Weight bearing as tolerated. D/C O2 and pulse ox and try on room  air. Hemovac pulled without difficulty, will begin therapy today.  Plan is to go Home after hospital stay. Plan for discharge today after 1-2 sessions of therapy as long as she is meeting her goals. Scheduled for OPPT at [facility] on 9/18. Follow up in the office in 2 weeks.   Griffith Citron, PA-C Orthopedic Surgery 07/31/2019, 7:23 AM

## 2019-08-01 NOTE — Discharge Summary (Signed)
Physician Discharge Summary   Patient ID: Stephanie [name] MRN: [id number] DOB/AGE: 1932-10-22 85 y.o.  Admit date: 07/30/2019 Discharge date: 07/31/2019  Primary Diagnosis: Osteoarthritis, right knee   Admission Diagnoses:  Past Medical History:  Diagnosis Date  . Arthropathy of cervical spine 11/27/2012   Right  Xray at [facility]  On 04/08/2016  AP, lateral, and lateral flexion and extension views of the cervical spine are submitted for evaluation.   No acute fracture identified in the cervical spine.   There is redemonstration of approximately 2 mm of C3 on C4 anterolisthesis in neutral which slightly increases in flexion relative to neutral and extension. Slight C5 on C6 retrolisthesis does not change i  . Benign hypertension without congestive heart failure   . Benign mole    right hcets mole, bleeds when dried with towel  . Bilateral dry eyes   . Bladder cancer ([place])   . Bladder cancer ([place]) 11/09/16  . Cervical spondylosis   . Cervicalgia    2 screws in place   . Depression with anxiety 11/09/16   prior to husbands death   . Gross hematuria    developed after first hip replacment, led to indicental finding of bladder tumor, bleeding now resolved   . History of kidney stones    1970's  . Left hand weakness    due to reinjury of flxor tendon s/p tendon surgery march 12-2018  . Lumbar stenosis   . Malignant tumor of urinary bladder ([place]) 07/2016   Pre-stage I  . OA (osteoarthritis)   . Parkinson disease [facility]) dx 2012   neurologist-  dr siddiqui at [facility]  . Psoriatic arthritis [facility])     Dr Trudie Reed , [place] [facility]   . Rupture of flexor tendon of hand 2020   right   Discharge Diagnoses:   Principal Problem:   OA (osteoarthritis) of knee Active Problems:   OA (osteoarthritis) of hip  Estimated body mass index is 22.14 kg/m as calculated from the following:   Height as of this encounter: 5\' 3"  (1.6 m).   Weight as of this encounter: 56.7 kg.  Procedure:  Procedure(s)  (LRB): TOTAL KNEE ARTHROPLASTY (Right)   Consults: None  HPI:  Stephanie [name] is a 41 y.o. year old female with end stage OA of her right knee with progressively worsening pain and dysfunction. She has constant pain, with activity and at rest and significant functional deficits with difficulties even with ADLs. She has had extensive non-op management including analgesics, injections of cortisone and viscosupplements, and home exercise program, but remains in significant pain with significant dysfunction.Radiographs show bone on bone arthritis lateral and patellofemoral. She presents now for right Total Knee Arthroplasty.    Laboratory Data: Admission on 07/30/2019, Discharged on 07/31/2019  Component Date Value Ref Range Status  . WBC 07/31/2019 9.6  4.0 - 10.5 K/uL Final  . RBC 07/31/2019 3.53* 3.87 - 5.11 MIL/uL Final  . Hemoglobin 07/31/2019 11.0* 12.0 - 15.0 g/dL Final  . HCT 07/31/2019 33.0* 36.0 - 46.0 % Final  . MCV 07/31/2019 93.5  80.0 - 100.0 fL Final  . MCH 07/31/2019 31.2  26.0 - 34.0 pg Final  . MCHC 07/31/2019 33.3  30.0 - 36.0 g/dL Final  . RDW 07/31/2019 12.7  11.5 - 15.5 % Final  . Platelets 07/31/2019 152  150 - 400 K/uL Final  . nRBC 07/31/2019 0.0  0.0 - 0.2 % Final   Performed at [facility], [address]  [place], [place] [address]  . Sodium 07/31/2019 135  135 - 145 mmol/L Final  . Potassium 07/31/2019 3.8  3.5 - 5.1 mmol/L Final  . Chloride 07/31/2019 97* 98 - 111 mmol/L Final  . CO2 07/31/2019 28  22 - 32 mmol/L Final  . Glucose, Bld 07/31/2019 131* 70 - 99 mg/dL Final  . BUN 07/31/2019 11  8 - 23 mg/dL Final  . Creatinine, Ser 07/31/2019 0.57  0.44 - 1.00 mg/dL Final  . Calcium 07/31/2019 8.5* 8.9 - 10.3 mg/dL Final  . GFR calc non Af Amer 07/31/2019 >60  >60 mL/min Final  . GFR calc Af Amer 07/31/2019 >60  >60 mL/min Final  . Anion gap 07/31/2019 10  5 - 15 Final   Performed at [facility], [place] [address]  [place], [place] [address]  Hospital Outpatient Visit on 07/26/2019  Component Date Value Ref Range Status  . SARS-CoV-2, NAA 07/26/2019 NOT DETECTED  NOT DETECTED Final   Comment: (NOTE) This nucleic acid amplification test was developed and its performance characteristics determined by [employer]. Nucleic acid amplification tests include PCR and TMA. This test has not been FDA cleared or approved. This test has been authorized by FDA under an Emergency Use Authorization (EUA). This test is only authorized for the duration of time the declaration that circumstances exist justifying the authorization of the emergency use of in vitro diagnostic tests for detection of SARS-CoV-2 virus and/or diagnosis of COVID-19 infection under section 564(b)(1) of the Act, 21 U.S.C. [id number]) (1), unless the authorization is terminated or revoked sooner. When diagnostic testing is negative, the possibility of a false negative result should be considered in the context of a patient's recent exposures and the presence of clinical signs and symptoms consistent with COVID-19. An individual without symptoms of COVID- 19 and who is not shedding SARS-CoV-2 vi                          rus would expect to have a negative (not detected) result in this assay. Performed At: [facility] [place], [place] [id number] Rush Farmer MD [id number]   . Coronavirus Source 07/26/2019 NASOPHARYNGEAL   Final   Performed at [facility], [place] [address] [place], [place] [address]  Hospital Outpatient Visit on 07/25/2019  Component Date Value Ref Range Status  . aPTT 07/25/2019 27  24 - 36 seconds Final   Performed at [facility], [place] [address] [place], [place] [address]  . WBC 07/25/2019 5.8  4.0 - 10.5 K/uL Final  . RBC 07/25/2019 4.80  3.87 - 5.11 MIL/uL Final  . Hemoglobin 07/25/2019 15.2* 12.0 - 15.0 g/dL Final  . HCT 07/25/2019 45.3  36.0 - 46.0 % Final   . MCV 07/25/2019 94.4  80.0 - 100.0 fL Final  . MCH 07/25/2019 31.7  26.0 - 34.0 pg Final  . MCHC 07/25/2019 33.6  30.0 - 36.0 g/dL Final  . RDW 07/25/2019 12.9  11.5 - 15.5 % Final  . Platelets 07/25/2019 187  150 - 400 K/uL Final  . nRBC 07/25/2019 0.0  0.0 - 0.2 % Final   Performed at [facility], [place] [address], [place], [place] [address]  . Sodium 07/25/2019 133* 135 - 145 mmol/L Final  . Potassium 07/25/2019 4.3  3.5 - 5.1 mmol/L Final  . Chloride 07/25/2019 93* 98 - 111 mmol/L Final  . CO2 07/25/2019 32  22 - 32  mmol/L Final  . Glucose, Bld 07/25/2019 104* 70 - 99 mg/dL Final  . BUN 07/25/2019 18  8 - 23 mg/dL Final  . Creatinine, Ser 07/25/2019 0.68  0.44 - 1.00 mg/dL Final  . Calcium 07/25/2019 9.7  8.9 - 10.3 mg/dL Final  . Total Protein 07/25/2019 7.5  6.5 - 8.1 g/dL Final  . Albumin 07/25/2019 5.2* 3.5 - 5.0 g/dL Final  . AST 07/25/2019 29  15 - 41 U/L Final  . ALT 07/25/2019 6  0 - 44 U/L Final  . Alkaline Phosphatase 07/25/2019 45  38 - 126 U/L Final  . Total Bilirubin 07/25/2019 0.9  0.3 - 1.2 mg/dL Final  . GFR calc non Af Amer 07/25/2019 >60  >60 mL/min Final  . GFR calc Af Amer 07/25/2019 >60  >60 mL/min Final  . Anion gap 07/25/2019 8  5 - 15 Final   Performed at [facility], [place] [address] [place], [place] [address]  . Prothrombin Time 07/25/2019 13.2  11.4 - 15.2 seconds Final  . INR 07/25/2019 1.0  0.8 - 1.2 Final   Comment: (NOTE) INR goal varies based on device and disease states. Performed at [facility], [place] [address] [place], [place] [address]   . ABO/RH(D) 07/25/2019 A NEG   Final  . Antibody Screen 07/25/2019 NEG   Final  . Sample Expiration 07/25/2019 08/02/2019,2359   Final  . Extend sample reason 07/25/2019    Final                   Value:NO TRANSFUSIONS OR PREGNANCY IN THE PAST 3 MONTHS Performed at [facility], [place] [address] [place], [place] [address]   .  MRSA, PCR 07/25/2019 NEGATIVE  NEGATIVE Final  . Staphylococcus aureus 07/25/2019 NEGATIVE  NEGATIVE Final   Comment: (NOTE) The Xpert SA Assay (FDA approved for NASAL specimens in patients 30 years of age and older), is one component of a comprehensive surveillance program. It is not intended to diagnose infection nor to guide or monitor treatment. Performed at [facility], [place] [address], [place], [place] [address]   Abstract on 06/07/2019  Component Date Value Ref Range Status  . HM Mammogram 04/27/2019 0-4 Bi-Rad  0-4 Bi-Rad, Self Reported Normal Final     X-Rays:No results found.  EKG: Orders placed or performed during the hospital encounter of 12/12/18  . EKG 12 lead  . EKG 12 lead     Hospital Course: JNIAH FISHBURNE is a 85 y.o. who was admitted to [facility]. They were brought to the operating room on 07/30/2019 and underwent Procedure(s): TOTAL KNEE ARTHROPLASTY.  Patient tolerated the procedure well and was later transferred to the recovery room and then to the orthopaedic floor for postoperative care. They were given PO and IV analgesics for pain control following their surgery. They were given 24 hours of postoperative antibiotics of  Anti-infectives (From admission, onward)   Start     Dose/Rate Route Frequency Ordered Stop   07/30/19 2200  vancomycin (VANCOCIN) IVPB 1000 mg/200 mL premix     1,000 mg 200 mL/hr over 60 Minutes Intravenous Every 12 hours 07/30/19 1415 07/30/19 2355   07/30/19 0815  vancomycin (VANCOCIN) IVPB 1000 mg/200 mL premix     1,000 mg 200 mL/hr over 60 Minutes Intravenous On call to O.R. 07/30/19 0805 07/30/19 1042     and started on DVT prophylaxis in the form of Xarelto.   PT and  OT were ordered for total joint protocol. Discharge planning consulted to help with postop disposition and equipment needs.  Patient had a good night on the evening of surgery. They started to get up OOB with therapy on POD #1. Pt was seen  during rounds and was ready to go home pending progress with therapy. Hemovac drain was pulled without difficulty. She worked with therapy on POD #1 and was meeting her goals. Pt was discharged to home later that day in stable condition.  Diet: Regular diet Activity: WBAT Follow-up: in 2 weeks Disposition: Home Discharged Condition: good   Discharge Instructions    Call MD / Call 911   Complete by: As directed    If you experience chest pain or shortness of breath, CALL 911 and be transported to the hospital emergency room.  If you develope a fever above 101 F, pus (white drainage) or increased drainage or redness at the wound, or calf pain, call your surgeon's office.   Change dressing   Complete by: As directed    Change dressing on Wednesday, then change the dressing daily with sterile 4 x 4 inch gauze dressing and apply TED hose.   Constipation Prevention   Complete by: As directed    Drink plenty of fluids.  Prune juice may be helpful.  You may use a stool softener, such as Colace (over the counter) 100 mg twice a day.  Use MiraLax (over the counter) for constipation as needed.   Diet - low sodium heart healthy   Complete by: As directed    Discharge instructions   Complete by: As directed    Dr. Gaynelle Arabian Total Joint Specialist [facility] [address], [place], [place] [address] [phone]  TOTAL KNEE REPLACEMENT POSTOPERATIVE DIRECTIONS  Knee Rehabilitation, Guidelines Following Surgery  Results after knee surgery are often greatly improved when you follow the exercise, range of motion and muscle strengthening exercises prescribed by your doctor. Safety measures are also important to protect the knee from further injury. Any time any of these exercises cause you to have increased pain or swelling in your knee joint, decrease the amount until you are comfortable again and slowly increase them. If you have problems or questions, call your caregiver or  physical therapist for advice.   HOME CARE INSTRUCTIONS  Remove items at home which could result in a fall. This includes throw rugs or furniture in walking pathways.  ICE to the affected knee every three hours for 30 minutes at a time and then as needed for pain and swelling.  Continue to use ice on the knee for pain and swelling from surgery. You may notice swelling that will progress down to the foot and ankle.  This is normal after surgery.  Elevate the leg when you are not up walking on it.   Continue to use the breathing machine which will help keep your temperature down.  It is common for your temperature to cycle up and down following surgery, especially at night when you are not up moving around and exerting yourself.  The breathing machine keeps your lungs expanded and your temperature down. Do not place pillow under knee, focus on keeping the knee straight while resting   DIET You may resume your previous home diet once your are discharged from the hospital.  DRESSING / WOUND CARE / SHOWERING You may change your dressing 3-5 days after surgery.  Then change the dressing every day with sterile gauze.  Please use good  hand washing techniques before changing the dressing.  Do not use any lotions or creams on the incision until instructed by your surgeon. You may start showering once you are discharged home but do not submerge the incision under water. Just pat the incision dry and apply a dry gauze dressing on daily. Change the surgical dressing daily and reapply a dry dressing each time.  ACTIVITY Walk with your walker as instructed. Use walker as long as suggested by your caregivers. Avoid periods of inactivity such as sitting longer than an hour when not asleep. This helps prevent blood clots.  You may resume a sexual relationship in one month or when given the OK by your doctor.  You may return to work once you are cleared by your doctor.  Do not drive a car for 6 weeks or until  released by you surgeon.  Do not drive while taking narcotics.  WEIGHT BEARING Weight bearing as tolerated with assist device (walker, cane, etc) as directed, use it as long as suggested by your surgeon or therapist, typically at least 4-6 weeks.  POSTOPERATIVE CONSTIPATION PROTOCOL Constipation - defined medically as fewer than three stools per week and severe constipation as less than one stool per week.  One of the most common issues patients have following surgery is constipation.  Even if you have a regular bowel pattern at home, your normal regimen is likely to be disrupted due to multiple reasons following surgery.  Combination of anesthesia, postoperative narcotics, change in appetite and fluid intake all can affect your bowels.  In order to avoid complications following surgery, here are some recommendations in order to help you during your recovery period.  Colace (docusate) - Pick up an over-the-counter form of Colace or another stool softener and take twice a day as long as you are requiring postoperative pain medications.  Take with a full glass of water daily.  If you experience loose stools or diarrhea, hold the colace until you stool forms back up.  If your symptoms do not get better within 1 week or if they get worse, check with your doctor.  Dulcolax (bisacodyl) - Pick up over-the-counter and take as directed by the product packaging as needed to assist with the movement of your bowels.  Take with a full glass of water.  Use this product as needed if not relieved by Colace only.   MiraLax (polyethylene glycol) - Pick up over-the-counter to have on hand.  MiraLax is a solution that will increase the amount of water in your bowels to assist with bowel movements.  Take as directed and can mix with a glass of water, juice, soda, coffee, or tea.  Take if you go more than two days without a movement. Do not use MiraLax more than once per day. Call your doctor if you are still constipated  or irregular after using this medication for 7 days in a row.  If you continue to have problems with postoperative constipation, please contact the office for further assistance and recommendations.  If you experience "the worst abdominal pain ever" or develop nausea or vomiting, please contact the office immediatly for further recommendations for treatment.  ITCHING  If you experience itching with your medications, try taking only a single pain pill, or even half a pain pill at a time.  You can also use Benadryl over the counter for itching or also to help with sleep.   TED HOSE STOCKINGS Wear the elastic stockings on both legs for three weeks  following surgery during the day but you may remove then at night for sleeping.  MEDICATIONS See your medication summary on the "After Visit Summary" that the nursing staff will review with you prior to discharge.  You may have some home medications which will be placed on hold until you complete the course of blood thinner medication.  It is important for you to complete the blood thinner medication as prescribed by your surgeon.  Continue your approved medications as instructed at time of discharge.  PRECAUTIONS If you experience chest pain or shortness of breath - call 911 immediately for transfer to the hospital emergency department.  If you develop a fever greater that 101 F, purulent drainage from wound, increased redness or drainage from wound, foul odor from the wound/dressing, or calf pain - CONTACT YOUR SURGEON.                                                   FOLLOW-UP APPOINTMENTS Make sure you keep all of your appointments after your operation with your surgeon and caregivers. You should call the office at the above phone number and make an appointment for approximately two weeks after the date of your surgery or on the date instructed by your surgeon outlined in the "After Visit Summary".   RANGE OF MOTION AND STRENGTHENING EXERCISES   Rehabilitation of the knee is important following a knee injury or an operation. After just a few days of immobilization, the muscles of the thigh which control the knee become weakened and shrink (atrophy). Knee exercises are designed to build up the tone and strength of the thigh muscles and to improve knee motion. Often times heat used for twenty to thirty minutes before working out will loosen up your tissues and help with improving the range of motion but do not use heat for the first two weeks following surgery. These exercises can be done on a training (exercise) mat, on the floor, on a table or on a bed. Use what ever works the best and is most comfortable for you Knee exercises include:  Leg Lifts - While your knee is still immobilized in a splint or cast, you can do straight leg raises. Lift the leg to 60 degrees, hold for 3 sec, and slowly lower the leg. Repeat 10-20 times 2-3 times daily. Perform this exercise against resistance later as your knee gets better.  Quad and Hamstring Sets - Tighten up the muscle on the front of the thigh (Quad) and hold for 5-10 sec. Repeat this 10-20 times hourly. Hamstring sets are done by pushing the foot backward against an object and holding for 5-10 sec. Repeat as with quad sets.  Leg Slides: Lying on your back, slowly slide your foot toward your buttocks, bending your knee up off the floor (only go as far as is comfortable). Then slowly slide your foot back down until your leg is flat on the floor again. Angel Wings: Lying on your back spread your legs to the side as far apart as you can without causing discomfort.  A rehabilitation program following serious knee injuries can speed recovery and prevent re-injury in the future due to weakened muscles. Contact your doctor or a physical therapist for more information on knee rehabilitation.   IF YOU ARE TRANSFERRED TO A SKILLED REHAB FACILITY If the patient is transferred to  a skilled rehab facility following  release from the hospital, a list of the current medications will be sent to the facility for the patient to continue.  When discharged from the skilled rehab facility, please have the facility set up the patient's [place] prior to being released. Also, the skilled facility will be responsible for providing the patient with their medications at time of release from the facility to include their pain medication, the muscle relaxants, and their blood thinner medication. If the patient is still at the rehab facility at time of the two week follow up appointment, the skilled rehab facility will also need to assist the patient in arranging follow up appointment in our office and any transportation needs.  MAKE SURE YOU:  Understand these instructions.  Get help right away if you are not doing well or get worse.    Pick up stool softner and laxative for home use following surgery while on pain medications. Do not submerge incision under water. Please use good hand washing techniques while changing dressing each day. May shower starting three days after surgery. Please use a clean towel to pat the incision dry following showers. Continue to use ice for pain and swelling after surgery. Do not use any lotions or creams on the incision until instructed by your surgeon.   Do not put a pillow under the knee. Place it under the heel.   Complete by: As directed    Driving restrictions   Complete by: As directed    No driving for two weeks   TED hose   Complete by: As directed    Use stockings (TED hose) for three weeks on both leg(s).  You may remove them at night for sleeping.   Weight bearing as tolerated   Complete by: As directed      Allergies as of 07/31/2019      Reactions   Codeine Nausea And Vomiting   Hornet Venom    Penicillins Itching, Swelling   Has patient had a PCN reaction causing immediate rash, facial/tongue/throat swelling, SOB or lightheadedness with  hypotension: Yes Has patient had a PCN reaction causing severe rash involving mucus membranes or skin necrosis: No Has patient had a PCN reaction that required hospitalization: No Has patient had a PCN reaction occurring within the last 10 years: No If all of the above answers are "NO", then may proceed with Cephalosporin use.   Yellow Jacket Venom Swelling   Lodine [etodolac] Rash   Tramadol Itching, Rash      Medication List    STOP taking these medications   CO-ENZYME Q-10 PO   diclofenac 50 MG EC tablet Commonly known as: VOLTAREN   Multi-Vitamins Tabs   PROBIOTIC DAILY PO   vitamin C 500 MG tablet Commonly known as: ASCORBIC ACID   Vitamin D 50 MCG (2000 UT) tablet     TAKE these medications   Carbidopa-Levodopa ER 25-100 MG tablet controlled release Commonly known as: SINEMET CR Take 1 tablet by mouth at bedtime.   carbidopa-levodopa 25-100 MG tablet Commonly known as: SINEMET IR Take 1 tablet by mouth 4 (four) times daily.   diazepam 5 MG tablet Commonly known as: VALIUM Take 2.5 mg by mouth at bedtime.   diclofenac sodium 1 % Gel Commonly known as: VOLTAREN Apply 1 application topically 3 (three) times daily as needed (knee pain).   estradiol 0.5 MG tablet Commonly known as: ESTRACE Take 1/2 tab po q day What changed:   how  much to take  how to take this  when to take this   fluticasone 0.05 % cream Commonly known as: CUTIVATE Apply 1 application topically daily as needed (scalp psoriasis).   gabapentin 100 MG capsule Commonly known as: NEURONTIN Take 1 capsule (100 mg total) by mouth 2 (two) times daily. Take a 100 mg capsule three times a day for two weeks following surgery.Then take a 100 mg capsule two times a day for two weeks. Then take a 100 mg capsule once a day for two weeks. Then discontinue the Gabapentin.   hydrochlorothiazide 25 MG tablet Commonly known as: HYDRODIURIL TAKE 1 AND 1/2 TABLETS DAILY BY MOUTH What changed: See the  new instructions.   medroxyPROGESTERone 2.5 MG tablet Commonly known as: PROVERA Take 0.5 tablets (1.25 mg total) by mouth daily.   methocarbamol 500 MG tablet Commonly known as: ROBAXIN Take 1 tablet (500 mg total) by mouth every 6 (six) hours as needed for muscle spasms.   naloxegol oxalate 12.5 MG Tabs tablet Commonly known as: Movantik Take 1 tablet (12.5 mg total) by mouth daily.   oxyCODONE 5 MG immediate release tablet Commonly known as: Oxy IR/ROXICODONE Take 1-2 tablets (5-10 mg total) by mouth every 6 (six) hours as needed for moderate pain (pain score 4-6).   rivaroxaban 10 MG Tabs tablet Commonly known as: XARELTO Take 1 tablet (10 mg total) by mouth daily with breakfast for 20 days. Take one tablet Xarelto once a day for three weeks following surgery. Then change to one baby Aspirin (81 mg) once a day for three weeks. Then discontinue Aspirin.   sertraline 25 MG tablet Commonly known as: ZOLOFT TAKE 1 TABLET BY MOUTH EVERY DAY IN THE MORNING What changed: See the new instructions.   Simponi Aria 50 MG/4ML Soln injection Generic drug: golimumab Inject 50 mg into the vein See admin instructions. Given every 2 months - Last given 06/28/2019   Systane 0.4-0.3 % Soln Generic drug: Polyethyl Glycol-Propyl Glycol Apply 1 drop to eye 2 (two) times daily.            Discharge Care Instructions  (From admission, onward)         Start     Ordered   07/31/19 0000  Weight bearing as tolerated     07/31/19 0731   07/31/19 0000  Change dressing    Comments: Change dressing on Wednesday, then change the dressing daily with sterile 4 x 4 inch gauze dressing and apply TED hose.   07/31/19 0731         Follow-up Information    Gaynelle Arabian, MD. Go on 08/14/2019.   Specialty: Orthopedic Surgery Why: You are scheduled for a postoperative appointment on 08-14-19 at 2:00 pm.  Contact information: [address] [place] [address] [id number]         Rosilyn Mings.. Go on 08/03/2019.   Why: You are scheduled for a physical therapy appointment on 08-03-19 at 10:00 am.  Contact information: [place] [address] [id number]           Signed: Griffith Citron, PA-C Orthopedic Surgery 08/01/2019, 10:40 AM

## 2019-08-03 DIAGNOSIS — M25661 Stiffness of right knee, not elsewhere classified: Secondary | ICD-10-CM | POA: Diagnosis not present

## 2019-08-03 DIAGNOSIS — M25561 Pain in right knee: Secondary | ICD-10-CM | POA: Diagnosis not present

## 2019-08-06 DIAGNOSIS — M25561 Pain in right knee: Secondary | ICD-10-CM | POA: Diagnosis not present

## 2019-08-06 DIAGNOSIS — M25661 Stiffness of right knee, not elsewhere classified: Secondary | ICD-10-CM | POA: Diagnosis not present

## 2019-08-10 DIAGNOSIS — M25661 Stiffness of right knee, not elsewhere classified: Secondary | ICD-10-CM | POA: Diagnosis not present

## 2019-08-10 DIAGNOSIS — M25561 Pain in right knee: Secondary | ICD-10-CM | POA: Diagnosis not present

## 2019-08-15 DIAGNOSIS — M25661 Stiffness of right knee, not elsewhere classified: Secondary | ICD-10-CM | POA: Diagnosis not present

## 2019-08-15 DIAGNOSIS — M25561 Pain in right knee: Secondary | ICD-10-CM | POA: Diagnosis not present

## 2019-08-17 DIAGNOSIS — M25661 Stiffness of right knee, not elsewhere classified: Secondary | ICD-10-CM | POA: Diagnosis not present

## 2019-08-17 DIAGNOSIS — M25561 Pain in right knee: Secondary | ICD-10-CM | POA: Diagnosis not present

## 2019-08-21 DIAGNOSIS — M25661 Stiffness of right knee, not elsewhere classified: Secondary | ICD-10-CM | POA: Diagnosis not present

## 2019-08-21 DIAGNOSIS — M25561 Pain in right knee: Secondary | ICD-10-CM | POA: Diagnosis not present

## 2019-08-23 DIAGNOSIS — L4050 Arthropathic psoriasis, unspecified: Secondary | ICD-10-CM | POA: Diagnosis not present

## 2019-08-24 DIAGNOSIS — M25561 Pain in right knee: Secondary | ICD-10-CM | POA: Diagnosis not present

## 2019-08-24 DIAGNOSIS — M25661 Stiffness of right knee, not elsewhere classified: Secondary | ICD-10-CM | POA: Diagnosis not present

## 2019-08-28 DIAGNOSIS — M25561 Pain in right knee: Secondary | ICD-10-CM | POA: Diagnosis not present

## 2019-08-28 DIAGNOSIS — M25661 Stiffness of right knee, not elsewhere classified: Secondary | ICD-10-CM | POA: Diagnosis not present

## 2019-08-31 ENCOUNTER — Encounter

## 2019-08-31 ENCOUNTER — Ambulatory Visit: Payer: Medicare Other | Admitting: Obstetrics & Gynecology

## 2019-08-31 DIAGNOSIS — M25561 Pain in right knee: Secondary | ICD-10-CM | POA: Diagnosis not present

## 2019-08-31 DIAGNOSIS — M25661 Stiffness of right knee, not elsewhere classified: Secondary | ICD-10-CM | POA: Diagnosis not present

## 2019-09-04 DIAGNOSIS — Z96651 Presence of right artificial knee joint: Secondary | ICD-10-CM | POA: Diagnosis not present

## 2019-09-04 DIAGNOSIS — M25661 Stiffness of right knee, not elsewhere classified: Secondary | ICD-10-CM | POA: Diagnosis not present

## 2019-09-04 DIAGNOSIS — Z471 Aftercare following joint replacement surgery: Secondary | ICD-10-CM | POA: Diagnosis not present

## 2019-09-04 DIAGNOSIS — M25561 Pain in right knee: Secondary | ICD-10-CM | POA: Diagnosis not present

## 2019-09-07 DIAGNOSIS — M25661 Stiffness of right knee, not elsewhere classified: Secondary | ICD-10-CM | POA: Diagnosis not present

## 2019-09-07 DIAGNOSIS — M25561 Pain in right knee: Secondary | ICD-10-CM | POA: Diagnosis not present

## 2019-09-11 ENCOUNTER — Other Ambulatory Visit: Payer: Self-pay | Admitting: Family Medicine

## 2019-09-11 DIAGNOSIS — M25561 Pain in right knee: Secondary | ICD-10-CM | POA: Diagnosis not present

## 2019-09-11 DIAGNOSIS — M25661 Stiffness of right knee, not elsewhere classified: Secondary | ICD-10-CM | POA: Diagnosis not present

## 2019-09-11 NOTE — Telephone Encounter (Signed)
Requesting: valium  Contract:n/a UDS:n/a Last OV: 06/08/19 Next OV:n/a Last Refill:no information provided  Database:   Please advise

## 2019-09-14 DIAGNOSIS — M25661 Stiffness of right knee, not elsewhere classified: Secondary | ICD-10-CM | POA: Diagnosis not present

## 2019-09-14 DIAGNOSIS — M25561 Pain in right knee: Secondary | ICD-10-CM | POA: Diagnosis not present

## 2019-09-28 ENCOUNTER — Ambulatory Visit: Payer: Medicare Other | Admitting: Obstetrics & Gynecology

## 2019-09-28 ENCOUNTER — Telehealth: Payer: Self-pay | Admitting: *Deleted

## 2019-09-28 NOTE — Telephone Encounter (Signed)
Copied from [place] [id number]. Topic: General - Other >> Sep 28, 2019 12:36 PM Rayann Heman wrote: Reason for CRM: pt called and stated that she would like to know if Dr Charlett Blake will do a breast and pelvic exam for pt. Please advise

## 2019-10-01 NOTE — Telephone Encounter (Signed)
At patient age it is up to her, if she would like to continue doing them I am willing to do the exams for her.

## 2019-10-01 NOTE — Telephone Encounter (Signed)
Please advise 

## 2019-10-02 NOTE — Telephone Encounter (Signed)
Pt wanted to know for future reference if Dr. Charlett Blake can do this so she doesn't have to go back to Dr. Sabra Heck. She will not need further pap smears so she wants to see only Dr. Charlett Blake.

## 2019-10-02 NOTE — Telephone Encounter (Signed)
Pt need to schedule an appointment with Charlett Blake

## 2019-10-08 DIAGNOSIS — C641 Malignant neoplasm of right kidney, except renal pelvis: Secondary | ICD-10-CM | POA: Diagnosis not present

## 2019-10-08 DIAGNOSIS — C672 Malignant neoplasm of lateral wall of bladder: Secondary | ICD-10-CM | POA: Diagnosis not present

## 2019-10-18 DIAGNOSIS — L4050 Arthropathic psoriasis, unspecified: Secondary | ICD-10-CM | POA: Diagnosis not present

## 2019-10-29 ENCOUNTER — Encounter: Payer: Self-pay | Admitting: Family Medicine

## 2019-11-23 ENCOUNTER — Other Ambulatory Visit: Payer: Self-pay | Admitting: Family Medicine

## 2019-11-25 ENCOUNTER — Ambulatory Visit: Payer: Medicare Other | Attending: Internal Medicine

## 2019-11-25 DIAGNOSIS — Z23 Encounter for immunization: Secondary | ICD-10-CM | POA: Insufficient documentation

## 2019-11-25 NOTE — Progress Notes (Signed)
   Covid-19 Vaccination Clinic  Name:  SEAN DEFREECE    MRN: [id number] DOB: 1932/02/10  11/25/2019  Ms. Massie was observed post Covid-19 immunization for 30 minutes based on pre-vaccination screening without incidence. She was provided with Vaccine Information Sheet and instruction to access the V-Safe system.   Ms. Dario was instructed to call 911 with any severe reactions post vaccine: Marland Kitchen Difficulty breathing  . Swelling of your face and throat  . A fast heartbeat  . A bad rash all over your body  . Dizziness and weakness    Immunizations Administered    Name Date Dose VIS Date Route   Pfizer COVID-19 Vaccine 11/25/2019  1:38 PM 0.3 mL 10/26/2019 Intramuscular   Manufacturer: [employer], [employer]   Lot: [id number]   [place]: [id number]

## 2019-11-26 DIAGNOSIS — Z981 Arthrodesis status: Secondary | ICD-10-CM | POA: Diagnosis not present

## 2019-11-26 DIAGNOSIS — M542 Cervicalgia: Secondary | ICD-10-CM | POA: Diagnosis not present

## 2019-11-29 DIAGNOSIS — M6281 Muscle weakness (generalized): Secondary | ICD-10-CM | POA: Diagnosis not present

## 2019-11-29 DIAGNOSIS — Z981 Arthrodesis status: Secondary | ICD-10-CM | POA: Diagnosis not present

## 2019-11-29 DIAGNOSIS — Z7409 Other reduced mobility: Secondary | ICD-10-CM | POA: Diagnosis not present

## 2019-11-29 DIAGNOSIS — Z789 Other specified health status: Secondary | ICD-10-CM | POA: Diagnosis not present

## 2019-12-04 DIAGNOSIS — Z7409 Other reduced mobility: Secondary | ICD-10-CM | POA: Diagnosis not present

## 2019-12-04 DIAGNOSIS — Z981 Arthrodesis status: Secondary | ICD-10-CM | POA: Diagnosis not present

## 2019-12-04 DIAGNOSIS — Z789 Other specified health status: Secondary | ICD-10-CM | POA: Diagnosis not present

## 2019-12-04 DIAGNOSIS — M6281 Muscle weakness (generalized): Secondary | ICD-10-CM | POA: Diagnosis not present

## 2019-12-05 DIAGNOSIS — L821 Other seborrheic keratosis: Secondary | ICD-10-CM | POA: Diagnosis not present

## 2019-12-05 DIAGNOSIS — D2272 Melanocytic nevi of left lower limb, including hip: Secondary | ICD-10-CM | POA: Diagnosis not present

## 2019-12-05 DIAGNOSIS — L570 Actinic keratosis: Secondary | ICD-10-CM | POA: Diagnosis not present

## 2019-12-05 DIAGNOSIS — L409 Psoriasis, unspecified: Secondary | ICD-10-CM | POA: Diagnosis not present

## 2019-12-05 DIAGNOSIS — L578 Other skin changes due to chronic exposure to nonionizing radiation: Secondary | ICD-10-CM | POA: Diagnosis not present

## 2019-12-05 DIAGNOSIS — D2271 Melanocytic nevi of right lower limb, including hip: Secondary | ICD-10-CM | POA: Diagnosis not present

## 2019-12-06 DIAGNOSIS — Z7409 Other reduced mobility: Secondary | ICD-10-CM | POA: Diagnosis not present

## 2019-12-06 DIAGNOSIS — Z789 Other specified health status: Secondary | ICD-10-CM | POA: Diagnosis not present

## 2019-12-06 DIAGNOSIS — Z981 Arthrodesis status: Secondary | ICD-10-CM | POA: Diagnosis not present

## 2019-12-06 DIAGNOSIS — M6281 Muscle weakness (generalized): Secondary | ICD-10-CM | POA: Diagnosis not present

## 2019-12-09 ENCOUNTER — Other Ambulatory Visit: Payer: Self-pay | Admitting: Family Medicine

## 2019-12-13 DIAGNOSIS — Z79899 Other long term (current) drug therapy: Secondary | ICD-10-CM | POA: Diagnosis not present

## 2019-12-13 DIAGNOSIS — L4050 Arthropathic psoriasis, unspecified: Secondary | ICD-10-CM | POA: Diagnosis not present

## 2019-12-16 ENCOUNTER — Ambulatory Visit: Payer: Medicare Other | Attending: Internal Medicine

## 2019-12-16 DIAGNOSIS — Z23 Encounter for immunization: Secondary | ICD-10-CM | POA: Insufficient documentation

## 2019-12-16 NOTE — Progress Notes (Signed)
   Covid-19 Vaccination Clinic  Name:  Stephanie [name]    MRN: [id number] DOB: Mar 27, 1932  12/16/2019  [name] was observed post Covid-19 immunization for 15 minutes without incidence. She was provided with Vaccine Information Sheet and instruction to access the V-Safe system.   [name] was instructed to call 911 with any severe reactions post vaccine: Marland Kitchen Difficulty breathing  . Swelling of your face and throat  . A fast heartbeat  . A bad rash all over your body  . Dizziness and weakness    Immunizations Administered    Name Date Dose VIS Date Route   Pfizer COVID-19 Vaccine 12/16/2019  1:04 PM 0.3 mL 10/26/2019 Intramuscular   Manufacturer: [place]   Lot: [id number]   [place]: [id number]

## 2019-12-19 ENCOUNTER — Telehealth: Payer: Self-pay

## 2019-12-19 NOTE — Telephone Encounter (Signed)
Patient called in to see if Dr. Charlett Blake could put in a referral to Dr. Laroy Apple. Turner (Cardiology)   [place] on [address]   Please follow up with the patient at [phone]   Thanks

## 2019-12-20 DIAGNOSIS — Z7409 Other reduced mobility: Secondary | ICD-10-CM | POA: Diagnosis not present

## 2019-12-20 DIAGNOSIS — M6281 Muscle weakness (generalized): Secondary | ICD-10-CM | POA: Diagnosis not present

## 2019-12-20 DIAGNOSIS — Z981 Arthrodesis status: Secondary | ICD-10-CM | POA: Diagnosis not present

## 2019-12-20 DIAGNOSIS — Z789 Other specified health status: Secondary | ICD-10-CM | POA: Diagnosis not present

## 2019-12-21 ENCOUNTER — Other Ambulatory Visit: Payer: Self-pay | Admitting: Family Medicine

## 2019-12-21 DIAGNOSIS — I119 Hypertensive heart disease without heart failure: Secondary | ICD-10-CM

## 2019-12-21 DIAGNOSIS — I10 Essential (primary) hypertension: Secondary | ICD-10-CM

## 2019-12-21 DIAGNOSIS — E7800 Pure hypercholesterolemia, unspecified: Secondary | ICD-10-CM

## 2019-12-21 NOTE — Telephone Encounter (Signed)
Please advise 

## 2019-12-21 NOTE — Telephone Encounter (Signed)
Referral is done

## 2019-12-24 ENCOUNTER — Telehealth: Payer: Self-pay | Admitting: Cardiology

## 2019-12-24 NOTE — Telephone Encounter (Signed)
New Message     Pt would like to change from Dr Agustin Cree to Dr Radford Pax     Please advise

## 2019-12-24 NOTE — Telephone Encounter (Signed)
I am fine with that 

## 2019-12-24 NOTE — Telephone Encounter (Signed)
Fine with me

## 2019-12-26 DIAGNOSIS — M6281 Muscle weakness (generalized): Secondary | ICD-10-CM | POA: Diagnosis not present

## 2019-12-26 DIAGNOSIS — Z789 Other specified health status: Secondary | ICD-10-CM | POA: Diagnosis not present

## 2019-12-26 DIAGNOSIS — Z981 Arthrodesis status: Secondary | ICD-10-CM | POA: Diagnosis not present

## 2019-12-26 DIAGNOSIS — Z7409 Other reduced mobility: Secondary | ICD-10-CM | POA: Diagnosis not present

## 2019-12-27 ENCOUNTER — Ambulatory Visit (INDEPENDENT_AMBULATORY_CARE_PROVIDER_SITE_OTHER): Payer: Medicare Other | Admitting: Cardiology

## 2019-12-27 ENCOUNTER — Other Ambulatory Visit: Payer: Self-pay

## 2019-12-27 ENCOUNTER — Encounter: Payer: Self-pay | Admitting: Cardiology

## 2019-12-27 VITALS — BP 138/86 | HR 84 | Ht 63.0 in | Wt 126.2 lb

## 2019-12-27 DIAGNOSIS — I10 Essential (primary) hypertension: Secondary | ICD-10-CM

## 2019-12-27 DIAGNOSIS — E7800 Pure hypercholesterolemia, unspecified: Secondary | ICD-10-CM | POA: Diagnosis not present

## 2019-12-27 DIAGNOSIS — Z01818 Encounter for other preprocedural examination: Secondary | ICD-10-CM

## 2019-12-27 NOTE — Patient Instructions (Signed)
Medication Instructions:  Your physician recommends that you continue on your current medications as directed. Please refer to the Current Medication list given to you today.  *If you need a refill on your cardiac medications before your next appointment, please call your pharmacy*   Lab Work: Fasting lipids and ALT  If you have labs (blood work) drawn today and your tests are completely normal, you will receive your results only by: . MyChart Message (if you have MyChart) OR . A paper copy in the mail If you have any lab test that is abnormal or we need to change your treatment, we will call you to review the results.   Follow-Up: At [facility], you and your health needs are our priority.  As part of our continuing mission to provide you with exceptional heart care, we have created designated Provider Care Teams.  These Care Teams include your primary Cardiologist (physician) and Advanced Practice Providers (APPs -  Physician Assistants and Nurse Practitioners) who all work together to provide you with the care you need, when you need it.  Your next appointment:   1 year(s)  The format for your next appointment:   In Person  Provider:   You may see Traci Turner, MD or one of the following Advanced Practice Providers on your designated Care Team:    Dayna Dunn, PA-C  Michele Lenze, PA-C    

## 2019-12-27 NOTE — Progress Notes (Signed)
Cardiology Office Note:    Date:  12/27/2019   ID:  Stephanie [name], DOB 1932/01/30, MRN [id number]  PCP:  Stephanie Lukes, MD  Cardiologist:  Stephanie Him, MD    Referring MD: Stephanie Lukes, MD   Chief Complaint  Patient presents with  . Follow-up    HTN    History of Present Illness:    Stephanie [name] is a 86 y.o. female with a hx of HTN and HLD followed by Stephanie. Agustin [name] in the past who is now referred by Stephanie [name] to change Cardiology providers.  She is doing well.  She denies any chest pain or pressure, PND, orthopnea, LE edema, dizziness, palpitations or syncope. She walks daily at least 1/2 mile and does have some chronic DOE which is felt to be related to her scoliosis.  She also has psoriatic arthritis with several ruptured tendons in her left forearm in the past.  She is compliant with her meds and is tolerating meds with no SE.    Past Medical History:  Diagnosis Date  . Arthropathy of cervical spine 11/27/2012   Right  Xray at [facility]  On 04/08/2016  AP, lateral, and lateral flexion and extension views of the cervical spine are submitted for evaluation.   No acute fracture identified in the cervical spine.   There is redemonstration of approximately 2 mm of C3 on C4 anterolisthesis in neutral which slightly increases in flexion relative to neutral and extension. Slight C5 on C6 retrolisthesis does not change i  . Benign hypertension without congestive heart failure   . Benign mole    right hcets mole, bleeds when dried with towel  . Bilateral dry eyes   . Bladder cancer ([place]) Nov 14, 2016  . Cervical spondylosis   . Cervicalgia    2 screws in place   . Depression with anxiety November 14, 2016   prior to husbands death   . Gross hematuria    developed after first hip replacment, led to indicental finding of bladder tumor, bleeding now resolved   . History of kidney stones    1970's  . Left hand weakness    due to reinjury of flxor tendon s/p tendon surgery march 12-2018  .  Lumbar stenosis   . Malignant tumor of urinary bladder ([place]) 07/2016   Pre-stage I  . OA (osteoarthritis)   . Parkinson disease [facility]) dx 2012   neurologist-  Stephanie siddiqui at [facility]  . Psoriatic arthritis [facility])     Stephanie [name] , [place] rheum   . Rupture of flexor tendon of hand 2020   right    Past Surgical History:  Procedure Laterality Date  . BIOPSY MASS LEFT FIRST METACARPAL   06/23/2006   benign  . CATARACT EXTRACTION W/ INTRAOCULAR LENS  IMPLANT, BILATERAL  1999 and 2003  . CERVICAL FUSION  02/2017   c1-c2 ; reports she has 2 screws 2in long in place done at [facility] ; patient exhibits VERY LIMITED NECK ROM  . CYSTOSCOPY W/ RETROGRADES Bilateral 09/22/2016   Procedure: CYSTOSCOPY WITH RETROGRADE PYELOGRAM;  Surgeon: Stephanie Frock, MD;  Location: [facility];  Service: Urology;  Laterality: Bilateral;  . D & C HYSTEROSCOPY W/ RESECTION POLYP  07/11/2000  . DILATION AND CURETTAGE OF UTERUS  1960's  . EXCISION CYST AND DEBRIDEMENT RIGHT WIRST AND REMOVAL [place] BODY  01/09/2009  . FLEXOR TENDON REPAIR Left 12/12/2018   Procedure: REPAIR/TRANSFER FLEXOR DIGITORUM PROFUNDUS OF LEFT SMALL FINGER;  Surgeon: Stephanie Brod,  MD;  Location: [place];  Service: Orthopedics;  Laterality: Left;  . LAPAROSCOPY  yrs ago   infertility   . METACARPOPHALANGEAL JOINT ARTHRODESIS Right 05/25/1996  . TENDON REPAIR Left 01/15/2019   Hand  . TONSILLECTOMY AND ADENOIDECTOMY  child  . TOTAL HIP ARTHROPLASTY Left 07/07/2016   Procedure: LEFT TOTAL HIP ARTHROPLASTY ANTERIOR APPROACH;  Surgeon: Stephanie Arabian, MD;  Location: [facility];  Service: Orthopedics;  Laterality: Left;  . TOTAL HIP ARTHROPLASTY Right 09/28/2017   Procedure: RIGHT TOTAL HIP ARTHROPLASTY ANTERIOR APPROACH;  Surgeon: Stephanie Arabian, MD;  Location: [facility];  Service: Orthopedics;  Laterality: Right;  . TOTAL KNEE ARTHROPLASTY Right 07/30/2019   Procedure: TOTAL KNEE ARTHROPLASTY;  Surgeon: Stephanie Arabian, MD;  Location: [facility];  Service: Orthopedics;  Laterality: Right;  91min  . TRANSURETHRAL RESECTION OF BLADDER TUMOR N/A 09/22/2016   Procedure: TRANSURETHRAL RESECTION OF BLADDER TUMOR (TURBT);  Surgeon: Stephanie Frock, MD;  Location: [facility];  Service: Urology;  Laterality: N/A;    Current Medications: Current Meds  Medication Sig  . carbidopa-levodopa (SINEMET IR) 25-100 MG per tablet Take 1 tablet by mouth 4 (four) times daily.   . Carbidopa-Levodopa ER (SINEMET CR) 25-100 MG tablet controlled release Take 1 tablet by mouth at bedtime.  . diazepam (VALIUM) 5 MG tablet TAKE 1 TABLET (5 MG TOTAL) BY MOUTH EVERY 12 (TWELVE) HOURS AS NEEDED. FOR ANXIETY  . diclofenac sodium (VOLTAREN) 1 % GEL Apply 1 application topically 3 (three) times daily as needed (knee pain).  Marland Kitchen estradiol (ESTRACE) 0.5 MG tablet Take 1/2 tab po q day (Patient taking differently: Take 0.25 mg by mouth daily. Take 1/2 tab po q day)  . fluticasone (CUTIVATE) 0.05 % cream Apply 1 application topically daily as needed (scalp psoriasis).   Marland Kitchen golimumab (SIMPONI ARIA) 50 MG/4ML SOLN injection Inject 50 mg into the vein See admin instructions. Given every 2 months - Last given 06/28/2019  . hydrochlorothiazide (HYDRODIURIL) 25 MG tablet TAKE 1 AND 1/2 TABLETS DAILY BY MOUTH (Patient taking differently: Take 37.5 mg by mouth daily. )  . medroxyPROGESTERone (PROVERA) 2.5 MG tablet Take 0.5 tablets (1.25 mg total) by mouth daily.  Stephanie [name] Glycol-Propyl Glycol (SYSTANE) 0.4-0.3 % SOLN Apply 1 drop to eye 2 (two) times daily.   . sertraline (ZOLOFT) 25 MG tablet TAKE 1 TABLET BY MOUTH EVERY DAY IN THE MORNING     Allergies:   Codeine, Hornet venom, Penicillins, Yellow jacket venom, Lodine [etodolac], and Tramadol   Social History   Socioeconomic History  . Marital status: Widowed    Spouse name: Not on file  . Number of children: Not on file  . Years of education: Not on file  . Highest education  level: Not on file  Occupational History  . Not on file  Tobacco Use  . Smoking status: Former Smoker    Years: 10.00    Quit date: 05/04/1964    Years since quitting: 55.6  . Smokeless tobacco: Never Used  Substance and Sexual Activity  . Alcohol use: Yes    Alcohol/week: 7.0 standard drinks    Types: 7 Glasses of wine per week  . Drug use: No  . Sexual activity: Not Currently    Partners: Male  Other Topics Concern  . Not on file  Social History Narrative  . Not on file   Social Determinants of Health   Financial Resource Strain:   . Difficulty of Paying Living Expenses: Not on file  Food Insecurity:   . Worried About Charity fundraiser in the Last Year: Not on file  . Ran Out of Food in the Last Year: Not on file  Transportation Needs:   . Lack of Transportation (Medical): Not on file  . Lack of Transportation (Non-Medical): Not on file  Physical Activity:   . Days of Exercise per Week: Not on file  . Minutes of Exercise per Session: Not on file  Stress:   . Feeling of Stress : Not on file  Social Connections:   . Frequency of Communication with Friends and Family: Not on file  . Frequency of Social Gatherings with Friends and Family: Not on file  . Attends Religious Services: Not on file  . Active Member of Clubs or Organizations: Not on file  . Attends Archivist Meetings: Not on file  . Marital Status: Not on file     Family History: The patient's family history includes Arthritis in her father, mother, and sister; Deep vein thrombosis in her mother; Diabetes in her sister; Heart disease in her maternal grandfather, maternal grandmother, and sister; Hypertension in her mother and sister; Obesity in her sister; Peripheral vascular disease in her paternal grandmother; Stroke in her father and paternal grandfather.  ROS:   Please see the history of present illness.    ROS  All other systems reviewed and negative.   EKGs/Labs/Other Studies Reviewed:      The following studies were reviewed today: Prior office notes from Stephanie. Drue Dun and outside labs on [facility]  EKG:  EKG is  ordered today.  The ekg ordered today demonstrates NSR with no ST changes  Recent Labs: 07/25/2019: ALT 6 07/31/2019: BUN 11; Creatinine, Ser 0.57; Hemoglobin 11.0; Platelets 152; Potassium 3.8; Sodium 135   Recent Lipid Panel    Component Value Date/Time   CHOL 177 03/13/2018 1512   TRIG 103.0 03/13/2018 1512   HDL 56.60 03/13/2018 1512   CHOLHDL 3 03/13/2018 1512   VLDL 20.6 03/13/2018 1512   LDLCALC 100 (H) 03/13/2018 1512   LDLDIRECT 120.0 12/11/2014 1040    Physical Exam:    VS:  BP 138/86   Pulse 84   Ht 5\' 3"  (1.6 m)   Wt 126 lb 3.2 oz (57.2 kg)   LMP 01/13/2013 Comment: spotting-had benign endometrial biopsy   BMI 22.36 kg/m     Wt Readings from Last 3 Encounters:  12/27/19 126 lb 3.2 oz (57.2 kg)  07/30/19 125 lb (56.7 kg)  07/25/19 125 lb (56.7 kg)     GEN:  Well nourished, well developed in no acute distress HEENT: Normal NECK: No JVD; No carotid bruits LYMPHATICS: No lymphadenopathy CARDIAC: RRR, no murmurs, rubs, gallops RESPIRATORY:  Clear to auscultation without rales, wheezing or rhonchi  ABDOMEN: Soft, non-tender, non-distended MUSCULOSKELETAL:  No edema; No deformity  SKIN: Warm and dry NEUROLOGIC:  Alert and oriented x 3 PSYCHIATRIC:  Normal affect   ASSESSMENT:    1. Essential hypertension   2. Pure hypercholesterolemia    PLAN:    In order of problems listed above: 1.  HTN -BP controlled on exam -continue HCTZ 37.5mg  daily  2.  HLD -her labs were reviewed from [facility] and show that last LDL was 100 2 years ago with no other labs since then -will check an FLP and ALT -she is currently not on any lipid lowering agents   Medication Adjustments/Labs and Tests Ordered: Current medicines are reviewed at length with the patient today.  Concerns  regarding medicines are outlined above.  Orders Placed This Encounter   Procedures  . Lipid panel  . ALT  . EKG 12-Lead   No orders of the defined types were placed in this encounter.   Signed, Stephanie Him, MD  12/27/2019 4:33 PM    [place]

## 2019-12-28 DIAGNOSIS — Z981 Arthrodesis status: Secondary | ICD-10-CM | POA: Diagnosis not present

## 2019-12-28 DIAGNOSIS — M6281 Muscle weakness (generalized): Secondary | ICD-10-CM | POA: Diagnosis not present

## 2019-12-28 DIAGNOSIS — Z789 Other specified health status: Secondary | ICD-10-CM | POA: Diagnosis not present

## 2019-12-28 DIAGNOSIS — Z7409 Other reduced mobility: Secondary | ICD-10-CM | POA: Diagnosis not present

## 2019-12-31 ENCOUNTER — Other Ambulatory Visit: Payer: Medicare Other

## 2019-12-31 ENCOUNTER — Other Ambulatory Visit: Payer: Self-pay

## 2019-12-31 DIAGNOSIS — E7800 Pure hypercholesterolemia, unspecified: Secondary | ICD-10-CM | POA: Diagnosis not present

## 2019-12-31 LAB — LIPID PANEL
Chol/HDL Ratio: 3 ratio (ref 0.0–4.4)
Cholesterol, Total: 173 mg/dL (ref 100–199)
HDL: 58 mg/dL (ref >39)
LDL Chol Calc (NIH): 99 mg/dL (ref 0–99)
Triglycerides: 85 mg/dL (ref 0–149)
VLDL Cholesterol Cal: 16 mg/dL (ref 5–40)

## 2019-12-31 LAB — ALT: ALT: 6 IU/L (ref 0–32)

## 2020-01-01 DIAGNOSIS — M6281 Muscle weakness (generalized): Secondary | ICD-10-CM | POA: Diagnosis not present

## 2020-01-01 DIAGNOSIS — Z7409 Other reduced mobility: Secondary | ICD-10-CM | POA: Diagnosis not present

## 2020-01-01 DIAGNOSIS — Z789 Other specified health status: Secondary | ICD-10-CM | POA: Diagnosis not present

## 2020-01-01 DIAGNOSIS — Z981 Arthrodesis status: Secondary | ICD-10-CM | POA: Diagnosis not present

## 2020-01-04 DIAGNOSIS — Z789 Other specified health status: Secondary | ICD-10-CM | POA: Diagnosis not present

## 2020-01-04 DIAGNOSIS — Z7409 Other reduced mobility: Secondary | ICD-10-CM | POA: Diagnosis not present

## 2020-01-04 DIAGNOSIS — M6281 Muscle weakness (generalized): Secondary | ICD-10-CM | POA: Diagnosis not present

## 2020-01-04 DIAGNOSIS — Z981 Arthrodesis status: Secondary | ICD-10-CM | POA: Diagnosis not present

## 2020-01-09 DIAGNOSIS — M6281 Muscle weakness (generalized): Secondary | ICD-10-CM | POA: Diagnosis not present

## 2020-01-09 DIAGNOSIS — Z7409 Other reduced mobility: Secondary | ICD-10-CM | POA: Diagnosis not present

## 2020-01-09 DIAGNOSIS — Z789 Other specified health status: Secondary | ICD-10-CM | POA: Diagnosis not present

## 2020-01-09 DIAGNOSIS — Z981 Arthrodesis status: Secondary | ICD-10-CM | POA: Diagnosis not present

## 2020-01-11 DIAGNOSIS — M6281 Muscle weakness (generalized): Secondary | ICD-10-CM | POA: Diagnosis not present

## 2020-01-11 DIAGNOSIS — Z7409 Other reduced mobility: Secondary | ICD-10-CM | POA: Diagnosis not present

## 2020-01-11 DIAGNOSIS — Z981 Arthrodesis status: Secondary | ICD-10-CM | POA: Diagnosis not present

## 2020-01-11 DIAGNOSIS — Z789 Other specified health status: Secondary | ICD-10-CM | POA: Diagnosis not present

## 2020-01-14 DIAGNOSIS — Z981 Arthrodesis status: Secondary | ICD-10-CM | POA: Diagnosis not present

## 2020-01-14 DIAGNOSIS — M6281 Muscle weakness (generalized): Secondary | ICD-10-CM | POA: Diagnosis not present

## 2020-01-14 DIAGNOSIS — Z789 Other specified health status: Secondary | ICD-10-CM | POA: Diagnosis not present

## 2020-01-14 DIAGNOSIS — Z7409 Other reduced mobility: Secondary | ICD-10-CM | POA: Diagnosis not present

## 2020-01-21 DIAGNOSIS — M542 Cervicalgia: Secondary | ICD-10-CM | POA: Diagnosis not present

## 2020-01-21 DIAGNOSIS — M47812 Spondylosis without myelopathy or radiculopathy, cervical region: Secondary | ICD-10-CM | POA: Diagnosis not present

## 2020-01-22 DIAGNOSIS — Z961 Presence of intraocular lens: Secondary | ICD-10-CM | POA: Diagnosis not present

## 2020-01-22 DIAGNOSIS — H16223 Keratoconjunctivitis sicca, not specified as Sjogren's, bilateral: Secondary | ICD-10-CM | POA: Diagnosis not present

## 2020-01-22 DIAGNOSIS — H26492 Other secondary cataract, left eye: Secondary | ICD-10-CM | POA: Diagnosis not present

## 2020-01-22 DIAGNOSIS — H35371 Puckering of macula, right eye: Secondary | ICD-10-CM | POA: Diagnosis not present

## 2020-01-24 DIAGNOSIS — Z789 Other specified health status: Secondary | ICD-10-CM | POA: Diagnosis not present

## 2020-01-24 DIAGNOSIS — M6281 Muscle weakness (generalized): Secondary | ICD-10-CM | POA: Diagnosis not present

## 2020-01-24 DIAGNOSIS — Z7409 Other reduced mobility: Secondary | ICD-10-CM | POA: Diagnosis not present

## 2020-01-24 DIAGNOSIS — Z981 Arthrodesis status: Secondary | ICD-10-CM | POA: Diagnosis not present

## 2020-01-26 ENCOUNTER — Other Ambulatory Visit: Payer: Self-pay | Admitting: Family Medicine

## 2020-01-28 DIAGNOSIS — Z7409 Other reduced mobility: Secondary | ICD-10-CM | POA: Diagnosis not present

## 2020-01-28 DIAGNOSIS — Z981 Arthrodesis status: Secondary | ICD-10-CM | POA: Diagnosis not present

## 2020-01-28 DIAGNOSIS — M6281 Muscle weakness (generalized): Secondary | ICD-10-CM | POA: Diagnosis not present

## 2020-01-28 DIAGNOSIS — Z789 Other specified health status: Secondary | ICD-10-CM | POA: Diagnosis not present

## 2020-02-05 DIAGNOSIS — Z7409 Other reduced mobility: Secondary | ICD-10-CM | POA: Diagnosis not present

## 2020-02-05 DIAGNOSIS — M6281 Muscle weakness (generalized): Secondary | ICD-10-CM | POA: Diagnosis not present

## 2020-02-05 DIAGNOSIS — Z789 Other specified health status: Secondary | ICD-10-CM | POA: Diagnosis not present

## 2020-02-05 DIAGNOSIS — Z981 Arthrodesis status: Secondary | ICD-10-CM | POA: Diagnosis not present

## 2020-02-07 DIAGNOSIS — Z7409 Other reduced mobility: Secondary | ICD-10-CM | POA: Diagnosis not present

## 2020-02-07 DIAGNOSIS — L4050 Arthropathic psoriasis, unspecified: Secondary | ICD-10-CM | POA: Diagnosis not present

## 2020-02-07 DIAGNOSIS — M6281 Muscle weakness (generalized): Secondary | ICD-10-CM | POA: Diagnosis not present

## 2020-02-07 DIAGNOSIS — Z981 Arthrodesis status: Secondary | ICD-10-CM | POA: Diagnosis not present

## 2020-02-07 DIAGNOSIS — Z789 Other specified health status: Secondary | ICD-10-CM | POA: Diagnosis not present

## 2020-02-12 DIAGNOSIS — M6281 Muscle weakness (generalized): Secondary | ICD-10-CM | POA: Diagnosis not present

## 2020-02-12 DIAGNOSIS — Z7409 Other reduced mobility: Secondary | ICD-10-CM | POA: Diagnosis not present

## 2020-02-12 DIAGNOSIS — Z789 Other specified health status: Secondary | ICD-10-CM | POA: Diagnosis not present

## 2020-02-12 DIAGNOSIS — Z981 Arthrodesis status: Secondary | ICD-10-CM | POA: Diagnosis not present

## 2020-02-14 ENCOUNTER — Other Ambulatory Visit: Payer: Self-pay

## 2020-02-14 ENCOUNTER — Other Ambulatory Visit: Payer: Medicare Other

## 2020-02-14 ENCOUNTER — Ambulatory Visit (INDEPENDENT_AMBULATORY_CARE_PROVIDER_SITE_OTHER): Payer: Medicare Other | Admitting: Family Medicine

## 2020-02-14 VITALS — BP 132/79 | HR 86 | Temp 98.8°F | Resp 12 | Ht 63.0 in | Wt 126.8 lb

## 2020-02-14 DIAGNOSIS — G20 Parkinson's disease: Secondary | ICD-10-CM | POA: Diagnosis not present

## 2020-02-14 DIAGNOSIS — E7800 Pure hypercholesterolemia, unspecified: Secondary | ICD-10-CM

## 2020-02-14 DIAGNOSIS — Z981 Arthrodesis status: Secondary | ICD-10-CM | POA: Diagnosis not present

## 2020-02-14 DIAGNOSIS — Z789 Other specified health status: Secondary | ICD-10-CM | POA: Diagnosis not present

## 2020-02-14 DIAGNOSIS — M6281 Muscle weakness (generalized): Secondary | ICD-10-CM | POA: Diagnosis not present

## 2020-02-14 DIAGNOSIS — M542 Cervicalgia: Secondary | ICD-10-CM

## 2020-02-14 DIAGNOSIS — R739 Hyperglycemia, unspecified: Secondary | ICD-10-CM | POA: Diagnosis not present

## 2020-02-14 DIAGNOSIS — Z7409 Other reduced mobility: Secondary | ICD-10-CM | POA: Diagnosis not present

## 2020-02-14 DIAGNOSIS — I10 Essential (primary) hypertension: Secondary | ICD-10-CM | POA: Diagnosis not present

## 2020-02-14 DIAGNOSIS — I119 Hypertensive heart disease without heart failure: Secondary | ICD-10-CM

## 2020-02-14 MED ORDER — VITAMIN D (ERGOCALCIFEROL) 1.25 MG (50000 UNIT) PO CAPS: 50000.0000 [IU] | ORAL_CAPSULE | ORAL | 4 refills | Status: DC

## 2020-02-14 NOTE — Patient Instructions (Signed)
Omron Blood Pressure cuff, upper arm, want BP 100-140/60-90 Pulse oximeter, want oxygen in 90s  Weekly vitals  Take Multivitamin with minerals, selenium Vitamin D 1000-2000 IU daily Probiotic with lactobacillus and bifidophilus Asprin EC 81 mg daily Fish or krill oil day Melatonin 2-5 mg at bedtime  [email or web address]

## 2020-02-15 LAB — COMPREHENSIVE METABOLIC PANEL
AG Ratio: 2.2 (calc) (ref 1.0–2.5)
ALT: 14 U/L (ref 6–29)
AST: 27 U/L (ref 10–35)
Albumin: 4.4 g/dL (ref 3.6–5.1)
Alkaline phosphatase (APISO): 41 U/L (ref 37–153)
BUN: 24 mg/dL (ref 7–25)
CO2: 31 mmol/L (ref 20–32)
Calcium: 10 mg/dL (ref 8.6–10.4)
Chloride: 94 mmol/L — ABNORMAL LOW (ref 98–110)
Creat: 0.83 mg/dL (ref 0.60–0.88)
Globulin: 2 g/dL (calc) (ref 1.9–3.7)
Glucose, Bld: 92 mg/dL (ref 65–99)
Potassium: 4.2 mmol/L (ref 3.5–5.3)
Sodium: 136 mmol/L (ref 135–146)
Total Bilirubin: 0.8 mg/dL (ref 0.2–1.2)
Total Protein: 6.4 g/dL (ref 6.1–8.1)

## 2020-02-15 LAB — HEMOGLOBIN A1C
Hgb A1c MFr Bld: 5.1 % of total Hgb (ref <5.7)
Mean Plasma Glucose: 100 (calc)
eAG (mmol/L): 5.5 (calc)

## 2020-02-15 LAB — CBC
HCT: 42.6 % (ref 35.0–45.0)
Hemoglobin: 14.4 g/dL (ref 11.7–15.5)
MCH: 31.3 pg (ref 27.0–33.0)
MCHC: 33.8 g/dL (ref 32.0–36.0)
MCV: 92.6 fL (ref 80.0–100.0)
MPV: 10.3 fL (ref 7.5–12.5)
Platelets: 187 10*3/uL (ref 140–400)
RBC: 4.6 10*6/uL (ref 3.80–5.10)
RDW: 13 % (ref 11.0–15.0)
WBC: 5.8 10*3/uL (ref 3.8–10.8)

## 2020-02-15 LAB — TSH: TSH: 1.4 mIU/L (ref 0.40–4.50)

## 2020-02-18 NOTE — Assessment & Plan Note (Signed)
Following with neurology 

## 2020-02-18 NOTE — Assessment & Plan Note (Signed)
Encouraged heart healthy diet, increase exercise, avoid trans fats, consider a krill oil cap daily 

## 2020-02-18 NOTE — Assessment & Plan Note (Signed)
Well controlled, no changes to meds. Encouraged heart healthy diet such as the DASH diet and exercise as tolerated.  °

## 2020-02-18 NOTE — Progress Notes (Signed)
Subjective:    Patient ID: Stephanie [name], female    DOB: 1931-12-22, 86 y.o.   MRN: [id number]  Chief Complaint  Patient presents with  . Hypertension    HPI Patient is in today for follow up on chronic medical concerns. She is doing well today. nore cent febrile illness or hospitalizations. Her neck pain is improved after surgery with Dr Redmond Pulling at [facility]. She continues to struggle with her tremors but is managing her ADLs still. Denies CP/palp/SOB/HA/congestion/fevers/GI or GU c/o. Taking meds as prescribed  Past Medical History:  Diagnosis Date  . Arthropathy of cervical spine 11/27/2012   Right  Xray at [facility]  On 04/08/2016  AP, lateral, and lateral flexion and extension views of the cervical spine are submitted for evaluation.   No acute fracture identified in the cervical spine.   There is redemonstration of approximately 2 mm of C3 on C4 anterolisthesis in neutral which slightly increases in flexion relative to neutral and extension. Slight C5 on C6 retrolisthesis does not change i  . Benign hypertension without congestive heart failure   . Benign mole    right hcets mole, bleeds when dried with towel  . Bilateral dry eyes   . Bladder cancer ([place]) October 19, 2016  . Cervical spondylosis   . Cervicalgia    2 screws in place   . Depression with anxiety October 19, 2016   prior to husbands death   . Gross hematuria    developed after first hip replacment, led to indicental finding of bladder tumor, bleeding now resolved   . History of kidney stones    1970's  . Left hand weakness    due to reinjury of flxor tendon s/p tendon surgery march 12-2018  . Lumbar stenosis   . Malignant tumor of urinary bladder ([place]) 07/2016   Pre-stage I  . OA (osteoarthritis)   . Parkinson disease [facility]) dx 2012   neurologist-  dr siddiqui at [facility]  . Psoriatic arthritis [facility])     Dr Trudie Reed , [place] [facility]   . Rupture of flexor tendon of hand 2020   right    Past Surgical History:  Procedure Laterality  Date  . BIOPSY MASS LEFT FIRST METACARPAL   06/23/2006   benign  . CATARACT EXTRACTION W/ INTRAOCULAR LENS  IMPLANT, BILATERAL  1999 and 2003  . CERVICAL FUSION  02/2017   c1-c2 ; reports she has 2 screws 2in long in place done at [facility] ; patient exhibits VERY LIMITED NECK ROM  . CYSTOSCOPY W/ RETROGRADES Bilateral 09/22/2016   Procedure: CYSTOSCOPY WITH RETROGRADE PYELOGRAM;  Surgeon: Alexis Frock, MD;  Location: [facility];  Service: Urology;  Laterality: Bilateral;  . D & C HYSTEROSCOPY W/ RESECTION POLYP  07/11/2000  . DILATION AND CURETTAGE OF UTERUS  1960's  . EXCISION CYST AND DEBRIDEMENT RIGHT WIRST AND REMOVAL [place] BODY  01/09/2009  . FLEXOR TENDON REPAIR Left 12/12/2018   Procedure: REPAIR/TRANSFER FLEXOR DIGITORUM PROFUNDUS OF LEFT SMALL FINGER;  Surgeon: Daryll Brod, MD;  Location: [facility];  Service: Orthopedics;  Laterality: Left;  . LAPAROSCOPY  yrs ago   infertility   . METACARPOPHALANGEAL JOINT ARTHRODESIS Right 05/25/1996  . TENDON REPAIR Left 01/15/2019   Hand  . TONSILLECTOMY AND ADENOIDECTOMY  child  . TOTAL HIP ARTHROPLASTY Left 07/07/2016   Procedure: LEFT TOTAL HIP ARTHROPLASTY ANTERIOR APPROACH;  Surgeon: Gaynelle Arabian, MD;  Location: [facility];  Service: Orthopedics;  Laterality: Left;  . TOTAL HIP ARTHROPLASTY Right 09/28/2017  Procedure: RIGHT TOTAL HIP ARTHROPLASTY ANTERIOR APPROACH;  Surgeon: Gaynelle Arabian, MD;  Location: [facility];  Service: Orthopedics;  Laterality: Right;  . TOTAL KNEE ARTHROPLASTY Right 07/30/2019   Procedure: TOTAL KNEE ARTHROPLASTY;  Surgeon: Gaynelle Arabian, MD;  Location: [facility];  Service: Orthopedics;  Laterality: Right;  27min  . TRANSURETHRAL RESECTION OF BLADDER TUMOR N/A 09/22/2016   Procedure: TRANSURETHRAL RESECTION OF BLADDER TUMOR (TURBT);  Surgeon: Alexis Frock, MD;  Location: [facility];  Service: Urology;  Laterality: N/A;    Family History  Problem Relation Age  of Onset  . Arthritis Mother   . Hypertension Mother   . Deep vein thrombosis Mother        recurrent, secondary to Bleeding disorder  . Arthritis Father   . Stroke Father   . Diabetes Sister   . Heart disease Sister   . Obesity Sister   . Arthritis Sister   . Hypertension Sister   . Heart disease Maternal Grandmother   . Heart disease Maternal Grandfather   . Peripheral vascular disease Paternal Grandmother        s/p leg amputation  . Stroke Paternal Grandfather     Social History   Socioeconomic History  . Marital status: Widowed    Spouse name: Not on file  . Number of children: Not on file  . Years of education: Not on file  . Highest education level: Not on file  Occupational History  . Not on file  Tobacco Use  . Smoking status: Former Smoker    Years: 10.00    Quit date: 05/04/1964    Years since quitting: 55.8  . Smokeless tobacco: Never Used  Substance and Sexual Activity  . Alcohol use: Yes    Alcohol/week: 7.0 standard drinks    Types: 7 Glasses of wine per week  . Drug use: No  . Sexual activity: Not Currently    Partners: Male  Other Topics Concern  . Not on file  Social History Narrative  . Not on file   Social Determinants of Health   Financial Resource Strain:   . Difficulty of Paying Living Expenses:   Food Insecurity:   . Worried About Charity fundraiser in the Last Year:   . Arboriculturist in the Last Year:   Transportation Needs:   . Film/video editor (Medical):   Marland Kitchen Lack of Transportation (Non-Medical):   Physical Activity:   . Days of Exercise per Week:   . Minutes of Exercise per Session:   Stress:   . Feeling of Stress :   Social Connections:   . Frequency of Communication with Friends and Family:   . Frequency of Social Gatherings with Friends and Family:   . Attends Religious Services:   . Active Member of Clubs or Organizations:   . Attends Archivist Meetings:   Marland Kitchen Marital Status:   Intimate Partner  Violence:   . Fear of Current or Ex-Partner:   . Emotionally Abused:   Marland Kitchen Physically Abused:   . Sexually Abused:     Outpatient Medications Prior to Visit  Medication Sig Dispense Refill  . carbidopa-levodopa (SINEMET IR) 25-100 MG per tablet Take 1 tablet by mouth 4 (four) times daily.     . Carbidopa-Levodopa ER (SINEMET CR) 25-100 MG tablet controlled release Take 1 tablet by mouth at bedtime.    . diazepam (VALIUM) 5 MG tablet TAKE 1 TABLET (5 MG TOTAL) BY MOUTH EVERY 12 (TWELVE) HOURS AS  NEEDED. FOR ANXIETY 60 tablet 1  . diclofenac (VOLTAREN) 50 MG EC tablet Take 50 mg by mouth 2 (two) times daily.    . diclofenac sodium (VOLTAREN) 1 % GEL Apply 1 application topically 3 (three) times daily as needed (knee pain).    Marland Kitchen estradiol (ESTRACE) 0.5 MG tablet Take 1/2 tab po q day (Patient taking differently: Take 0.25 mg by mouth daily. Take 1/2 tab po q day) 45 tablet 4  . fluticasone (CUTIVATE) 0.05 % cream Apply 1 application topically daily as needed (scalp psoriasis).   1  . golimumab (SIMPONI ARIA) 50 MG/4ML SOLN injection Inject 50 mg into the vein See admin instructions. Given every 2 months - Last given 06/28/2019    . hydrochlorothiazide (HYDRODIURIL) 25 MG tablet TAKE 1 AND 1/2 TABLETS DAILY BY MOUTH 135 tablet 1  . medroxyPROGESTERone (PROVERA) 2.5 MG tablet Take 0.5 tablets (1.25 mg total) by mouth daily. 45 tablet 4  . Polyethyl Glycol-Propyl Glycol (SYSTANE) 0.4-0.3 % SOLN Apply 1 drop to eye 2 (two) times daily.     . sertraline (ZOLOFT) 25 MG tablet TAKE 1 TABLET BY MOUTH EVERY DAY IN THE MORNING 90 tablet 1   No facility-administered medications prior to visit.    Allergies  Allergen Reactions  . Codeine Nausea And Vomiting  . Hornet Venom   . Penicillins Itching and Swelling    Has patient had a PCN reaction causing immediate rash, facial/tongue/throat swelling, SOB or lightheadedness with hypotension: Yes Has patient had a PCN reaction causing severe rash involving  mucus membranes or skin necrosis: No Has patient had a PCN reaction that required hospitalization: No Has patient had a PCN reaction occurring within the last 10 years: No If all of the above answers are "NO", then may proceed with Cephalosporin use.   . [place] Venom Swelling  . Lodine [Etodolac] Rash  . Tramadol Itching and Rash    Review of Systems  Constitutional: Negative for fever and malaise/fatigue.  HENT: Negative for congestion.   Eyes: Negative for blurred vision.  Respiratory: Negative for shortness of breath.   Cardiovascular: Negative for chest pain, palpitations and leg swelling.  Gastrointestinal: Negative for abdominal pain, blood in stool and nausea.  Genitourinary: Negative for dysuria and frequency.  Musculoskeletal: Positive for back pain and neck pain. Negative for falls.  Skin: Negative for rash.  Neurological: Positive for tremors. Negative for dizziness, loss of consciousness and headaches.  Endo/Heme/Allergies: Negative for environmental allergies.  Psychiatric/Behavioral: Negative for depression. The patient is not nervous/anxious.        Objective:    Physical Exam  BP 132/79 (BP Location: Left Arm, Cuff Size: Normal)   Pulse 86   Temp 98.8 F (37.1 C) (Temporal)   Resp 12   Ht 5\' 3"  (1.6 m)   Wt 126 lb 12.8 oz (57.5 kg)   LMP 01/13/2013 Comment: spotting-had benign endometrial biopsy   SpO2 99%   BMI 22.46 kg/m  Wt Readings from Last 3 Encounters:  02/14/20 126 lb 12.8 oz (57.5 kg)  12/27/19 126 lb 3.2 oz (57.2 kg)  07/30/19 125 lb (56.7 kg)    Diabetic Foot Exam - Simple   No data filed     Lab Results  Component Value Date   WBC 5.8 02/14/2020   HGB 14.4 02/14/2020   HCT 42.6 02/14/2020   PLT 187 02/14/2020   GLUCOSE 92 02/14/2020   CHOL 173 12/31/2019   TRIG 85 12/31/2019   HDL 58 12/31/2019  LDLDIRECT 120.0 12/11/2014   LDLCALC 99 12/31/2019   ALT 14 02/14/2020   AST 27 02/14/2020   NA 136 02/14/2020   K 4.2  02/14/2020   CL 94 (L) 02/14/2020   CREATININE 0.83 02/14/2020   BUN 24 02/14/2020   CO2 31 02/14/2020   TSH 1.40 02/14/2020   INR 1.0 07/25/2019   HGBA1C 5.1 02/14/2020    Lab Results  Component Value Date   TSH 1.40 02/14/2020   Lab Results  Component Value Date   WBC 5.8 02/14/2020   HGB 14.4 02/14/2020   HCT 42.6 02/14/2020   MCV 92.6 02/14/2020   PLT 187 02/14/2020   Lab Results  Component Value Date   NA 136 02/14/2020   K 4.2 02/14/2020   CO2 31 02/14/2020   GLUCOSE 92 02/14/2020   BUN 24 02/14/2020   CREATININE 0.83 02/14/2020   BILITOT 0.8 02/14/2020   ALKPHOS 45 07/25/2019   AST 27 02/14/2020   ALT 14 02/14/2020   PROT 6.4 02/14/2020   ALBUMIN 5.2 (H) 07/25/2019   CALCIUM 10.0 02/14/2020   ANIONGAP 10 07/31/2019   GFR 72.60 03/13/2018   Lab Results  Component Value Date   CHOL 173 12/31/2019   Lab Results  Component Value Date   HDL 58 12/31/2019   Lab Results  Component Value Date   LDLCALC 99 12/31/2019   Lab Results  Component Value Date   TRIG 85 12/31/2019   Lab Results  Component Value Date   CHOLHDL 3.0 12/31/2019   Lab Results  Component Value Date   HGBA1C 5.1 02/14/2020       Assessment & Plan:   Problem List Items Addressed This Visit    Benign hypertensive heart disease without heart failure    Is following with cardiology, Dr Radford Pax and doing well      HTN (hypertension) - Primary    Well controlled, no changes to meds. Encouraged heart healthy diet such as the DASH diet and exercise as tolerated.       Relevant Orders   CBC (Completed)   Comprehensive metabolic panel (Completed)   TSH (Completed)   Parkinson's disease ([place])    Following with neurology      Hyperlipidemia    Encouraged heart healthy diet, increase exercise, avoid trans fats, consider a krill oil cap daily      Neck pain    Underwent fusion at [facility] with  Mr Redmond Pulling and that has been helpful. Encouraged moist heat and gentle stretching as  tolerated. May try NSAIDs and prescription meds as directed and report if symptoms worsen or seek immediate care       Other Visit Diagnoses    Hypocalcemia       Relevant Medications   Vitamin D, Ergocalciferol, (DRISDOL) 1.25 MG (50000 UNIT) CAPS capsule   Hyperglycemia       Relevant Orders   Hemoglobin A1c (Completed)      I am having Armanda Magic. [place] start on Vitamin D (Ergocalciferol). I am also having her maintain her carbidopa-levodopa, Polyethyl Glycol-Propyl Glycol, fluticasone, Simponi Aria, estradiol, medroxyPROGESTERone, Carbidopa-Levodopa ER, diclofenac sodium, diazepam, sertraline, hydrochlorothiazide, and diclofenac.  Meds ordered this encounter  Medications  . Vitamin D, Ergocalciferol, (DRISDOL) 1.25 MG (50000 UNIT) CAPS capsule    Sig: Take 1 capsule (50,000 Units total) by mouth every 7 (seven) days.    Dispense:  4 capsule    Refill:  4     Penni Homans, MD

## 2020-02-18 NOTE — Assessment & Plan Note (Signed)
Is following with cardiology, Dr Radford Pax and doing well

## 2020-02-18 NOTE — Assessment & Plan Note (Signed)
Underwent fusion at [facility] with  Mr Redmond Pulling and that has been helpful. Encouraged moist heat and gentle stretching as tolerated. May try NSAIDs and prescription meds as directed and report if symptoms worsen or seek immediate care

## 2020-02-19 DIAGNOSIS — Z789 Other specified health status: Secondary | ICD-10-CM | POA: Diagnosis not present

## 2020-02-19 DIAGNOSIS — Z7409 Other reduced mobility: Secondary | ICD-10-CM | POA: Diagnosis not present

## 2020-02-19 DIAGNOSIS — Z981 Arthrodesis status: Secondary | ICD-10-CM | POA: Diagnosis not present

## 2020-02-19 DIAGNOSIS — M6281 Muscle weakness (generalized): Secondary | ICD-10-CM | POA: Diagnosis not present

## 2020-03-06 NOTE — Progress Notes (Signed)
Nurse connected with patient 03/07/20 at  1:45 PM EDT by a telephone enabled telemedicine application and verified that I am speaking with the correct person using two identifiers. Patient stated full name and DOB. Patient gave permission to continue with virtual visit. Patient's location was at home and Nurse's location was at [place] office.    Subjective:   Stephanie [name] is a 86 y.o. female who presents for Medicare Annual (Subsequent) preventive examination.  Review of Systems:  Home Safety/Smoke Alarms: Feels safe in home. Smoke alarms in place.  Lives alone in 1 story home. Grab bars and chair in shower. Verbalizes good support system.  Female:       Mammo-   04/27/19    Dexa scan-  03/08/16. declines        Objective:     Vitals: Unable to assess. This visit is enabled though telemedicine due to Covid 19.   Advanced Directives 03/07/2020 07/30/2019 07/25/2019 12/12/2018 12/07/2018 09/28/2017 09/21/2017  Does Patient Have a Medical Advance Directive? Yes Yes Yes Yes Yes Yes Yes  Type of Paramedic of [place];Living will Living will Living will [place];Living will [place];Living will Living will Living will  Does patient want to make changes to medical advance directive? No - Patient declined No - Patient declined No - Patient declined No - Patient declined No - Patient declined No - Patient declined No - Patient declined  Copy of [place] in Chart? Yes - validated most recent copy scanned in chart (See row information) - - Yes - validated most recent copy scanned in chart (See row information) Yes - validated most recent copy scanned in chart (See row information) - -    Tobacco Social History   Tobacco Use  Smoking Status Former Smoker  . Years: 10.00  . Quit date: 05/04/1964  . Years since quitting: 55.8  Smokeless Tobacco Never Used     Counseling given: Not Answered   Clinical Intake:  Pain : No/denies pain     Past Medical History:  Diagnosis Date  . Arthropathy of cervical spine 11/27/2012   Right  Xray at [facility]  On 04/08/2016  AP, lateral, and lateral flexion and extension views of the cervical spine are submitted for evaluation.   No acute fracture identified in the cervical spine.   There is redemonstration of approximately 2 mm of C3 on C4 anterolisthesis in neutral which slightly increases in flexion relative to neutral and extension. Slight C5 on C6 retrolisthesis does not change i  . Benign hypertension without congestive heart failure   . Benign mole    right hcets mole, bleeds when dried with towel  . Bilateral dry eyes   . Bladder cancer ([place]) 02-Nov-2016  . Cervical spondylosis   . Cervicalgia    2 screws in place   . Depression with anxiety 11/02/2016   prior to husbands death   . Gross hematuria    developed after first hip replacment, led to indicental finding of bladder tumor, bleeding now resolved   . History of kidney stones    1970's  . Left hand weakness    due to reinjury of flxor tendon s/p tendon surgery march 12-2018  . Lumbar stenosis   . Malignant tumor of urinary bladder ([place]) 07/2016   Pre-stage I  . OA (osteoarthritis)   . Parkinson disease [facility]) dx 2012   neurologist-  dr siddiqui at [facility]  . Psoriatic arthritis [facility])  Dr Trudie Reed , [place] [facility]   . Rupture of flexor tendon of hand 2020   right   Past Surgical History:  Procedure Laterality Date  . BIOPSY MASS LEFT FIRST METACARPAL   06/23/2006   benign  . CATARACT EXTRACTION W/ INTRAOCULAR LENS  IMPLANT, BILATERAL  1999 and 2003  . CERVICAL FUSION  02/2017   c1-c2 ; reports she has 2 screws 2in long in place done at [facility] ; patient exhibits VERY LIMITED NECK ROM  . CYSTOSCOPY W/ RETROGRADES Bilateral 09/22/2016   Procedure: CYSTOSCOPY WITH RETROGRADE PYELOGRAM;  Surgeon: Alexis Frock, MD;  Location: [facility];  Service: Urology;  Laterality:  Bilateral;  . D & C HYSTEROSCOPY W/ RESECTION POLYP  07/11/2000  . DILATION AND CURETTAGE OF UTERUS  1960's  . EXCISION CYST AND DEBRIDEMENT RIGHT WIRST AND REMOVAL [place] BODY  01/09/2009  . FLEXOR TENDON REPAIR Left 12/12/2018   Procedure: REPAIR/TRANSFER FLEXOR DIGITORUM PROFUNDUS OF LEFT SMALL FINGER;  Surgeon: Daryll Brod, MD;  Location: [place];  Service: Orthopedics;  Laterality: Left;  . LAPAROSCOPY  yrs ago   infertility   . METACARPOPHALANGEAL JOINT ARTHRODESIS Right 05/25/1996  . TENDON REPAIR Left 01/15/2019   Hand  . TONSILLECTOMY AND ADENOIDECTOMY  child  . TOTAL HIP ARTHROPLASTY Left 07/07/2016   Procedure: LEFT TOTAL HIP ARTHROPLASTY ANTERIOR APPROACH;  Surgeon: Gaynelle Arabian, MD;  Location: [facility];  Service: Orthopedics;  Laterality: Left;  . TOTAL HIP ARTHROPLASTY Right 09/28/2017   Procedure: RIGHT TOTAL HIP ARTHROPLASTY ANTERIOR APPROACH;  Surgeon: Gaynelle Arabian, MD;  Location: [facility];  Service: Orthopedics;  Laterality: Right;  . TOTAL KNEE ARTHROPLASTY Right 07/30/2019   Procedure: TOTAL KNEE ARTHROPLASTY;  Surgeon: Gaynelle Arabian, MD;  Location: [facility];  Service: Orthopedics;  Laterality: Right;  97min  . TRANSURETHRAL RESECTION OF BLADDER TUMOR N/A 09/22/2016   Procedure: TRANSURETHRAL RESECTION OF BLADDER TUMOR (TURBT);  Surgeon: Alexis Frock, MD;  Location: [facility];  Service: Urology;  Laterality: N/A;   Family History  Problem Relation Age of Onset  . Arthritis Mother   . Hypertension Mother   . Deep vein thrombosis Mother        recurrent, secondary to Bleeding disorder  . Arthritis Father   . Stroke Father   . Diabetes Sister   . Heart disease Sister   . Obesity Sister   . Arthritis Sister   . Hypertension Sister   . Heart disease Maternal Grandmother   . Heart disease Maternal Grandfather   . Peripheral vascular disease Paternal Grandmother        s/p leg amputation  . Stroke Paternal Grandfather    Social  History   Socioeconomic History  . Marital status: Widowed    Spouse name: Not on file  . Number of children: Not on file  . Years of education: Not on file  . Highest education level: Not on file  Occupational History  . Not on file  Tobacco Use  . Smoking status: Former Smoker    Years: 10.00    Quit date: 05/04/1964    Years since quitting: 55.8  . Smokeless tobacco: Never Used  Substance and Sexual Activity  . Alcohol use: Yes    Alcohol/week: 7.0 standard drinks    Types: 7 Glasses of wine per week  . Drug use: No  . Sexual activity: Not Currently    Partners: Male  Other Topics Concern  . Not on file  Social History Narrative  .  Not on file   Social Determinants of Health   Financial Resource Strain:   . Difficulty of Paying Living Expenses:   Food Insecurity:   . Worried About Charity fundraiser in the Last Year:   . Arboriculturist in the Last Year:   Transportation Needs:   . Film/video editor (Medical):   Marland Kitchen Lack of Transportation (Non-Medical):   Physical Activity:   . Days of Exercise per Week:   . Minutes of Exercise per Session:   Stress:   . Feeling of Stress :   Social Connections:   . Frequency of Communication with Friends and Family:   . Frequency of Social Gatherings with Friends and Family:   . Attends Religious Services:   . Active Member of Clubs or Organizations:   . Attends Archivist Meetings:   Marland Kitchen Marital Status:     Outpatient Encounter Medications as of 03/07/2020  Medication Sig  . carbidopa-levodopa (SINEMET IR) 25-100 MG per tablet Take 1 tablet by mouth 4 (four) times daily.   . Carbidopa-Levodopa ER (SINEMET CR) 25-100 MG tablet controlled release Take 1 tablet by mouth at bedtime.  . diazepam (VALIUM) 5 MG tablet TAKE 1 TABLET (5 MG TOTAL) BY MOUTH EVERY 12 (TWELVE) HOURS AS NEEDED. FOR ANXIETY  . diclofenac (VOLTAREN) 50 MG EC tablet Take 50 mg by mouth 2 (two) times daily.  . diclofenac sodium (VOLTAREN) 1 %  GEL Apply 1 application topically 3 (three) times daily as needed (knee pain).  Marland Kitchen estradiol (ESTRACE) 0.5 MG tablet Take 1/2 tab po q day (Patient taking differently: Take 0.25 mg by mouth daily. Take 1/2 tab po q day)  . fluticasone (CUTIVATE) 0.05 % cream Apply 1 application topically daily as needed (scalp psoriasis).   Marland Kitchen golimumab (SIMPONI ARIA) 50 MG/4ML SOLN injection Inject 50 mg into the vein See admin instructions. Given every 2 months - Last given 06/28/2019  . hydrochlorothiazide (HYDRODIURIL) 25 MG tablet TAKE 1 AND 1/2 TABLETS DAILY BY MOUTH  . medroxyPROGESTERone (PROVERA) 2.5 MG tablet Take 0.5 tablets (1.25 mg total) by mouth daily.  Vladimir Faster Glycol-Propyl Glycol (SYSTANE) 0.4-0.3 % SOLN Apply 1 drop to eye 2 (two) times daily.   . sertraline (ZOLOFT) 25 MG tablet TAKE 1 TABLET BY MOUTH EVERY DAY IN THE MORNING  . Vitamin D, Ergocalciferol, (DRISDOL) 1.25 MG (50000 UNIT) CAPS capsule Take 1 capsule (50,000 Units total) by mouth every 7 (seven) days.   No facility-administered encounter medications on file as of 03/07/2020.    Activities of Daily Living In your present state of health, do you have any difficulty performing the following activities: 03/07/2020 07/30/2019  Hearing? Tempie Donning  Comment hearing aids. -  Vision? N N  Difficulty concentrating or making decisions? N N  Walking or climbing stairs? N N  Dressing or bathing? N N  Doing errands, shopping? N N  Preparing Food and eating ? N -  Using the Toilet? N -  In the past six months, have you accidently leaked urine? N -  Do you have problems with loss of bowel control? N -  Managing your Medications? N -  Managing your Finances? N -  Housekeeping or managing your Housekeeping? Y -  Comment has housekeeper -  Some recent data might be hidden    Patient Care Team: Mosie Lukes, MD as PCP - General (Family Medicine) Sueanne Margarita, MD as PCP - Cardiology (Cardiology) Megan Salon, MD as  Consulting Physician  (Gynecology) Eldridge Abrahams, MD as Referring Physician (Neurology) Alexis Frock, MD as Consulting Physician (Urology) Katy Fitch, Darlina Guys, MD as Consulting Physician (Ophthalmology) Corene Cornea, DC (Chiropractic Medicine) Jari Pigg, MD as Consulting Physician (Dermatology)    Assessment:   This is a routine wellness examination for Stephanie [name]. Physical assessment deferred to PCP.   Exercise Activities and Dietary recommendations Current Exercise Habits: Home exercise routine, Type of exercise: walking, Time (Minutes): 30, Frequency (Times/Week): 5, Weekly Exercise (Minutes/Week): 150, Intensity: Mild, Exercise limited by: None identified Diet (meal preparation, eat out, water intake, caffeinated beverages, dairy products, fruits and vegetables): in general, a "healthy" diet  , well balanced   Goals    . Decrease neck pain    . Increase physical activity       Fall Risk Fall Risk  03/07/2020 10/03/2018 10/29/2016 07/14/2016 06/29/2016  Falls in the past year? 0 0 No No No  Comment - Emmi Telephone Survey: data to providers prior to load - Emmi Telephone Survey: data to providers prior to load -  Number falls in past yr: 0 - - - -  Injury with Fall? 0 - - - -  Follow up Education provided;Falls prevention discussed - - - -   Depression Screen PHQ 2/9 Scores 02/14/2020 10/29/2016 06/29/2016 06/11/2015  PHQ - 2 Score 0 1 6 0  PHQ- 9 Score 1 - 15 -     Cognitive Function Ad8 score reviewed for issues:  Issues making decisions:no  Less interest in hobbies / activities:no  Repeats questions, stories (family complaining):no  Trouble using ordinary gadgets (microwave, computer, phone):no  Forgets the month or year: no  Mismanaging finances: no  Remembering appts:no  Daily problems with thinking and/or memory:no Ad8 score is=0     MMSE - Mini Mental State Exam 10/29/2016  Orientation to time 5  Orientation to Place 5  Registration 3  Attention/ Calculation 5   Recall 3  Language- name 2 objects 2  Language- repeat 1  Language- follow 3 step command 3  Language- read & follow direction 1  Write a sentence 1  Copy design 0  Total score 29        Immunization History  Administered Date(s) Administered  . Influenza Split 06/29/2019  . Influenza Whole 08/15/2012  . Influenza, High Dose Seasonal PF 08/19/2015, 08/27/2017, 08/15/2018  . Influenza,inj,quad, With Preservative 08/15/2017, 08/15/2018  . Influenza-Unspecified 08/15/2014, 08/24/2016, 08/15/2018  . PFIZER SARS-COV-2 Vaccination 11/25/2019, 12/16/2019  . Pneumococcal Conjugate-13 12/11/2014  . Pneumococcal Polysaccharide-23 11/24/2011  . Pneumococcal-Unspecified 11/18/2014  . Td 02/25/2014  . Zoster 08/27/2011  . Zoster Recombinat (Shingrix) 01/20/2018, 04/07/2018    Screening Tests Health Maintenance  Topic Date Due  . MAMMOGRAM  05/04/2020  . INFLUENZA VACCINE  06/15/2020  . TETANUS/TDAP  02/26/2024  . DEXA SCAN  Completed  . COVID-19 Vaccine  Completed  . PNA vac Low Risk Adult  Completed      Plan:    Please schedule your next medicare wellness visit with me in 1 yr.  Continue to eat heart healthy diet (full of fruits, vegetables, whole grains, lean protein, water--limit salt, fat, and sugar intake) and increase physical activity as tolerated.  Continue doing brain stimulating activities (puzzles, reading, adult coloring books, staying active) to keep memory sharp.     I have personally reviewed and noted the following in the patient's chart:   . Medical and social history . Use of alcohol, tobacco or illicit drugs  . Current medications  and supplements . Functional ability and status . Nutritional status . Physical activity . Advanced directives . List of other physicians . Hospitalizations, surgeries, and ER visits in previous 12 months . Vitals . Screenings to include cognitive, depression, and falls . Referrals and appointments  In addition, I have  reviewed and discussed with patient certain preventive protocols, quality metrics, and best practice recommendations. A written personalized care plan for preventive services as well as general preventive health recommendations were provided to patient.     Naaman Plummer [place], [place]  03/07/2020

## 2020-03-07 ENCOUNTER — Ambulatory Visit (INDEPENDENT_AMBULATORY_CARE_PROVIDER_SITE_OTHER): Payer: Medicare Other | Admitting: *Deleted

## 2020-03-07 ENCOUNTER — Encounter: Payer: Self-pay | Admitting: *Deleted

## 2020-03-07 ENCOUNTER — Other Ambulatory Visit: Payer: Self-pay

## 2020-03-07 DIAGNOSIS — Z0000 Encounter for general adult medical examination without abnormal findings: Secondary | ICD-10-CM

## 2020-03-07 NOTE — Patient Instructions (Signed)
Please schedule your next medicare wellness visit with me in 1 yr.  Continue to eat heart healthy diet (full of fruits, vegetables, whole grains, lean protein, water--limit salt, fat, and sugar intake) and increase physical activity as tolerated.  Continue doing brain stimulating activities (puzzles, reading, adult coloring books, staying active) to keep memory sharp.    Stephanie [name] , Thank you for taking time to come for your Medicare Wellness Visit. I appreciate your ongoing commitment to your health goals. Please review the following plan we discussed and let me know if I can assist you in the future.   These are the goals we discussed: Goals    . Decrease neck pain    . Increase physical activity       This is a list of the screening recommended for you and due dates:  Health Maintenance  Topic Date Due  . Mammogram  05/04/2020  . Flu Shot  06/15/2020  . Tetanus Vaccine  02/26/2024  . DEXA scan (bone density measurement)  Completed  . COVID-19 Vaccine  Completed  . Pneumonia vaccines  Completed    Preventive Care 16 Years and Older, Female Preventive care refers to lifestyle choices and visits with your health care provider that can promote health and wellness. This includes:  A yearly physical exam. This is also called an annual well check.  Regular dental and eye exams.  Immunizations.  Screening for certain conditions.  Healthy lifestyle choices, such as diet and exercise. What can I expect for my preventive care visit? Physical exam Your health care provider will check:  Height and weight. These may be used to calculate body mass index (BMI), which is a measurement that tells if you are at a healthy weight.  Heart rate and blood pressure.  Your skin for abnormal spots. Counseling Your health care provider may ask you questions about:  Alcohol, tobacco, and drug use.  Emotional well-being.  Home and relationship well-being.  Sexual activity.  Eating  habits.  History of falls.  Memory and ability to understand (cognition).  Work and work Statistician.  Pregnancy and menstrual history. What immunizations do I need?  Influenza (flu) vaccine  This is recommended every year. Tetanus, diphtheria, and pertussis (Tdap) vaccine  You may need a Td booster every 10 years. Varicella (chickenpox) vaccine  You may need this vaccine if you have not already been vaccinated. Zoster (shingles) vaccine  You may need this after age 58. Pneumococcal conjugate (PCV13) vaccine  One dose is recommended after age 60. Pneumococcal polysaccharide (PPSV23) vaccine  One dose is recommended after age 11. Measles, mumps, and rubella (MMR) vaccine  You may need at least one dose of MMR if you were born in 1957 or later. You may also need a second dose. Meningococcal conjugate (MenACWY) vaccine  You may need this if you have certain conditions. Hepatitis A vaccine  You may need this if you have certain conditions or if you travel or work in places where you may be exposed to hepatitis A. Hepatitis B vaccine  You may need this if you have certain conditions or if you travel or work in places where you may be exposed to hepatitis B. Haemophilus influenzae type b (Hib) vaccine  You may need this if you have certain conditions. You may receive vaccines as individual doses or as more than one vaccine together in one shot (combination vaccines). Talk with your health care provider about the risks and benefits of combination vaccines. What tests do  I need? Blood tests  Lipid and cholesterol levels. These may be checked every 5 years, or more frequently depending on your overall health.  Hepatitis C test.  Hepatitis B test. Screening  Lung cancer screening. You may have this screening every year starting at age 84 if you have a 30-pack-year history of smoking and currently smoke or have quit within the past 15 years.  Colorectal cancer screening.  All adults should have this screening starting at age 84 and continuing until age 5. Your health care provider may recommend screening at age 6 if you are at increased risk. You will have tests every 1-10 years, depending on your results and the type of screening test.  Diabetes screening. This is done by checking your blood sugar (glucose) after you have not eaten for a while (fasting). You may have this done every 1-3 years.  Mammogram. This may be done every 1-2 years. Talk with your health care provider about how often you should have regular mammograms.  BRCA-related cancer screening. This may be done if you have a family history of breast, ovarian, tubal, or peritoneal cancers. Other tests  Sexually transmitted disease (STD) testing.  Bone density scan. This is done to screen for osteoporosis. You may have this done starting at age 21. Follow these instructions at home: Eating and drinking  Eat a diet that includes fresh fruits and vegetables, whole grains, lean protein, and low-fat dairy products. Limit your intake of foods with high amounts of sugar, saturated fats, and salt.  Take vitamin and mineral supplements as recommended by your health care provider.  Do not drink alcohol if your health care provider tells you not to drink.  If you drink alcohol: ? Limit how much you have to 0-1 drink a day. ? Be aware of how much alcohol is in your drink. In the [place] one drink equals one 12 oz bottle of beer (355 mL), one 5 oz glass of wine (148 mL), or one 1 oz glass of hard liquor (44 mL). Lifestyle  Take daily care of your teeth and gums.  Stay active. Exercise for at least 30 minutes on 5 or more days each week.  Do not use any products that contain nicotine or tobacco, such as cigarettes, e-cigarettes, and chewing tobacco. If you need help quitting, ask your health care provider.  If you are sexually active, practice safe sex. Use a condom or other form of protection in order  to prevent STIs (sexually transmitted infections).  Talk with your health care provider about taking a low-dose aspirin or statin. What's next?  Go to your health care provider once a year for a well check visit.  Ask your health care provider how often you should have your eyes and teeth checked.  Stay up to date on all vaccines. This information is not intended to replace advice given to you by your health care provider. Make sure you discuss any questions you have with your health care provider. Document Revised: 10/26/2018 Document Reviewed: 10/26/2018 Elsevier Patient Education  2020 [employer].

## 2020-03-31 DIAGNOSIS — C672 Malignant neoplasm of lateral wall of bladder: Secondary | ICD-10-CM | POA: Diagnosis not present

## 2020-03-31 DIAGNOSIS — C641 Malignant neoplasm of right kidney, except renal pelvis: Secondary | ICD-10-CM | POA: Diagnosis not present

## 2020-04-03 DIAGNOSIS — L4050 Arthropathic psoriasis, unspecified: Secondary | ICD-10-CM | POA: Diagnosis not present

## 2020-04-03 DIAGNOSIS — Z79899 Other long term (current) drug therapy: Secondary | ICD-10-CM | POA: Diagnosis not present

## 2020-04-08 ENCOUNTER — Other Ambulatory Visit: Payer: Self-pay | Admitting: Family Medicine

## 2020-04-16 ENCOUNTER — Other Ambulatory Visit: Payer: Self-pay | Admitting: Family Medicine

## 2020-04-21 ENCOUNTER — Telehealth: Payer: Self-pay | Admitting: Family Medicine

## 2020-04-21 NOTE — Progress Notes (Signed)
  Chronic Care Management   Outreach Note  04/21/2020 Name: [name] MRN: [id number] DOB: [birth year]  Referred by: Mosie Lukes, MD Reason for referral : No chief complaint on file.   An unsuccessful telephone outreach was attempted today. The patient was referred to the pharmacist for assistance with care management and care coordination.   This note is not being shared with the patient for the following reason: To respect privacy (The patient or proxy has requested that the information not be shared).  Follow Up Plan:   Earney Hamburg Upstream Scheduler

## 2020-04-23 ENCOUNTER — Telehealth: Payer: Self-pay | Admitting: Family Medicine

## 2020-04-23 NOTE — Progress Notes (Signed)
°  Chronic Care Management   Note  04/23/2020 Name: Stephanie [name] MRN: [id number] DOB: Oct 27, 1932  Stephanie [name] is a 86 y.o. year old female who is a primary care patient of Mosie Lukes, MD. I reached out to [name] by phone today in response to a referral sent by Stephanie [name]'s PCP, Mosie Lukes, MD.   Stephanie [name] was given information about Chronic Care Management services today including:  1. CCM service includes personalized support from designated clinical staff supervised by her physician, including individualized plan of care and coordination with other care providers 2. 24/7 contact phone numbers for assistance for urgent and routine care needs. 3. Service will only be billed when office clinical staff spend 20 minutes or more in a month to coordinate care. 4. Only one practitioner may furnish and bill the service in a calendar month. 5. The patient may stop CCM services at any time (effective at the end of the month) by phone call to the office staff.   Patient agreed to services and verbal consent obtained.   This note is not being shared with the patient for the following reason: To respect privacy (The patient or proxy has requested that the information not be shared).  Follow up plan:   Earney Hamburg Upstream Scheduler

## 2020-04-28 DIAGNOSIS — Z1231 Encounter for screening mammogram for malignant neoplasm of breast: Secondary | ICD-10-CM | POA: Diagnosis not present

## 2020-04-28 LAB — HM MAMMOGRAPHY

## 2020-04-30 ENCOUNTER — Encounter: Payer: Self-pay | Admitting: *Deleted

## 2020-04-30 DIAGNOSIS — G20 Parkinson's disease: Secondary | ICD-10-CM | POA: Diagnosis not present

## 2020-05-01 ENCOUNTER — Other Ambulatory Visit: Payer: Self-pay | Admitting: Family Medicine

## 2020-05-01 DIAGNOSIS — L409 Psoriasis, unspecified: Secondary | ICD-10-CM | POA: Diagnosis not present

## 2020-05-01 DIAGNOSIS — M2550 Pain in unspecified joint: Secondary | ICD-10-CM | POA: Diagnosis not present

## 2020-05-01 DIAGNOSIS — M150 Primary generalized (osteo)arthritis: Secondary | ICD-10-CM | POA: Diagnosis not present

## 2020-05-01 DIAGNOSIS — Z79899 Other long term (current) drug therapy: Secondary | ICD-10-CM | POA: Diagnosis not present

## 2020-05-01 DIAGNOSIS — Z6823 Body mass index (BMI) 23.0-23.9, adult: Secondary | ICD-10-CM | POA: Diagnosis not present

## 2020-05-01 DIAGNOSIS — L4050 Arthropathic psoriasis, unspecified: Secondary | ICD-10-CM | POA: Diagnosis not present

## 2020-05-29 DIAGNOSIS — L4050 Arthropathic psoriasis, unspecified: Secondary | ICD-10-CM | POA: Diagnosis not present

## 2020-05-29 DIAGNOSIS — Z79899 Other long term (current) drug therapy: Secondary | ICD-10-CM | POA: Diagnosis not present

## 2020-05-30 LAB — BASIC METABOLIC PANEL
BUN: 25 — AB (ref 4–21)
CO2: 18 (ref 13–22)
Chloride: 96 — AB (ref 99–108)
Creatinine: 0.8 (ref 0.5–1.1)
Glucose: 86
Potassium: 3.9 (ref 3.4–5.3)
Sodium: 140 (ref 137–147)

## 2020-05-30 LAB — CBC AND DIFFERENTIAL
HCT: 42 (ref 36–46)
Hemoglobin: 14.7 (ref 12.0–16.0)
Platelets: 184 (ref 150–399)
WBC: 5

## 2020-05-30 LAB — HEPATIC FUNCTION PANEL
ALT: 20 (ref 7–35)
AST: 54 — AB (ref 13–35)
Alkaline Phosphatase: 45 (ref 25–125)
Bilirubin, Total: 0.7

## 2020-05-30 LAB — COMPREHENSIVE METABOLIC PANEL
Calcium: 9.8 (ref 8.7–10.7)
GFR calc non Af Amer: 67

## 2020-05-31 NOTE — Progress Notes (Signed)
[place] [facility] [employer] [facility] [place], [place], [place] [address] [phone]  Date:  06/02/2020   Name:  Stephanie [name]   DOB:  [birth year]   MRN:  [id number]  PCP:  Mosie Lukes, MD    Chief Complaint: Hypertension (elevated readings)   History of Present Illness:  Stephanie [name] is a 86 y.o. very pleasant female patient who presents with the following:  Patient of my partner Dr. Charlett Blake whom I have not seen previously  History of hypertension, Parkinson's disease managed by neurology at [facility] She has psoriatic arthritis and had Aria Symbposi infusion last week; noted that her BP was 159/90 at Dr Trudie Reed office  Since that time she has been checking her blood pressures at home, her systolics are running 948N to mid 462V, diastolics 03J to low 00X She is taking 37.5 mg of hctz for her BP now -no change in her regimen, she is compliant with her medications  BP Readings from Last 3 Encounters:  06/02/20 (!) 158/82  02/14/20 132/79  12/27/19 138/86   She plans to fly to [place] later this week to see her sister- she wants to make sure she is ok prior to this trip No HA or vision change, no CP or SOB She walks a mile most days for exercise  She had a knee replacement last fall and is back to her full exercise routine   covid done   I reviewed her recent labs from Dr. Trudie Reed' office, all looks fine except for a mild elevation in her AST. Patient has decreased her alcohol intake and will recheck labs in 1 month  Patient Active Problem List   Diagnosis Date Noted  . OA (osteoarthritis) of knee 07/30/2019  . Tinea corporis 06/08/2019  . Psoriatic arthritis (Stephanie [name]) 04/02/2019  . Anemia 03/13/2018  . Hot flashes 03/13/2018  . S/P cervical spinal fusion 03/31/2017  . Neck pain 12/12/2016  . Depression with anxiety 10/17/2016  . Bladder cancer ([place]) 10/17/2016  . OA (osteoarthritis) of hip 07/07/2016  . Spinal stenosis of lumbar  region with radiculopathy 04/08/2016  . Spondylolisthesis of lumbar region 04/08/2016  . Spondylosis of cervical region without myelopathy or radiculopathy 04/08/2016  . Preventative health care 01/18/2016  . Low back pain 01/18/2016  . History of chicken pox   . Hyperlipidemia 12/15/2014  . Allergic rhinitis 06/25/2014  . Allergy to bee sting 06/10/2014  . TMJ tenderness 11/16/2013  . Other malaise and fatigue 05/07/2013  . Arthropathy of cervical spine ([place]) 11/27/2012  . Muscle spasms of neck 11/27/2012  . Parkinson's disease ([place]) 05/24/2012  . Conjunctivochalasis 03/09/2012  . Epiretinal membrane 03/09/2012  . Hyperopia with astigmatism and presbyopia 03/09/2012  . Pseudophakia 03/09/2012  . HTN (hypertension) 08/31/2011  . Right knee pain 08/31/2011  . Benign hypertensive heart disease without heart failure 05/12/2011    Past Medical History:  Diagnosis Date  . Arthropathy of cervical spine 11/27/2012   Right  Xray at [facility]  On 04/08/2016  AP, lateral, and lateral flexion and extension views of the cervical spine are submitted for evaluation.   No acute fracture identified in the cervical spine.   There is redemonstration of approximately 2 mm of C3 on C4 anterolisthesis in neutral which slightly increases in flexion relative to neutral and extension. Slight C5 on C6 retrolisthesis does not change i  . Benign hypertension without congestive heart failure   . Benign mole  right hcets mole, bleeds when dried with towel  . Bilateral dry eyes   . Bladder cancer ([place]) 2016-11-16  . Cervical spondylosis   . Cervicalgia    2 screws in place   . Depression with anxiety 11/16/16   prior to husbands death   . Gross hematuria    developed after first hip replacment, led to indicental finding of bladder tumor, bleeding now resolved   . History of kidney stones    1970's  . Left hand weakness    due to reinjury of flxor tendon s/p tendon surgery march 12-2018  . Lumbar stenosis   .  Malignant tumor of urinary bladder ([place]) 07/2016   Pre-stage I  . OA (osteoarthritis)   . Parkinson disease [facility]) dx 2012   neurologist-  dr siddiqui at [facility]  . Psoriatic arthritis [facility])     Dr Trudie Reed , [place] [facility]   . Rupture of flexor tendon of hand 2020   right    Past Surgical History:  Procedure Laterality Date  . BIOPSY MASS LEFT FIRST METACARPAL   06/23/2006   benign  . CATARACT EXTRACTION W/ INTRAOCULAR LENS  IMPLANT, BILATERAL  1999 and 2003  . CERVICAL FUSION  02/2017   c1-c2 ; reports she has 2 screws 2in long in place done at [facility] ; patient exhibits VERY LIMITED NECK ROM  . CYSTOSCOPY W/ RETROGRADES Bilateral 09/22/2016   Procedure: CYSTOSCOPY WITH RETROGRADE PYELOGRAM;  Surgeon: Alexis Frock, MD;  Location: [facility];  Service: Urology;  Laterality: Bilateral;  . D & C HYSTEROSCOPY W/ RESECTION POLYP  07/11/2000  . DILATION AND CURETTAGE OF UTERUS  1960's  . EXCISION CYST AND DEBRIDEMENT RIGHT WIRST AND REMOVAL [place] BODY  01/09/2009  . FLEXOR TENDON REPAIR Left 12/12/2018   Procedure: REPAIR/TRANSFER FLEXOR DIGITORUM PROFUNDUS OF LEFT SMALL FINGER;  Surgeon: Daryll Brod, MD;  Location: [place];  Service: Orthopedics;  Laterality: Left;  . LAPAROSCOPY  yrs ago   infertility   . METACARPOPHALANGEAL JOINT ARTHRODESIS Right 05/25/1996  . TENDON REPAIR Left 01/15/2019   Hand  . TONSILLECTOMY AND ADENOIDECTOMY  child  . TOTAL HIP ARTHROPLASTY Left 07/07/2016   Procedure: LEFT TOTAL HIP ARTHROPLASTY ANTERIOR APPROACH;  Surgeon: Gaynelle Arabian, MD;  Location: [facility];  Service: Orthopedics;  Laterality: Left;  . TOTAL HIP ARTHROPLASTY Right 09/28/2017   Procedure: RIGHT TOTAL HIP ARTHROPLASTY ANTERIOR APPROACH;  Surgeon: Gaynelle Arabian, MD;  Location: [facility];  Service: Orthopedics;  Laterality: Right;  . TOTAL KNEE ARTHROPLASTY Right 07/30/2019   Procedure: TOTAL KNEE ARTHROPLASTY;  Surgeon: Gaynelle Arabian, MD;  Location: [facility];  Service: Orthopedics;  Laterality: Right;  52min  . TRANSURETHRAL RESECTION OF BLADDER TUMOR N/A 09/22/2016   Procedure: TRANSURETHRAL RESECTION OF BLADDER TUMOR (TURBT);  Surgeon: Alexis Frock, MD;  Location: [facility];  Service: Urology;  Laterality: N/A;    Social History   Tobacco Use  . Smoking status: Former Smoker    Years: 10.00    Quit date: 05/04/1964    Years since quitting: 56.1  . Smokeless tobacco: Never Used  Vaping Use  . Vaping Use: Never used  Substance Use Topics  . Alcohol use: Yes    Alcohol/week: 7.0 standard drinks    Types: 7 Glasses of wine per week  . Drug use: No    Family History  Problem Relation Age of Onset  . Arthritis Mother   . Hypertension Mother   . Deep vein  thrombosis Mother        recurrent, secondary to Bleeding disorder  . Arthritis Father   . Stroke Father   . Diabetes Sister   . Heart disease Sister   . Obesity Sister   . Arthritis Sister   . Hypertension Sister   . Heart disease Maternal Grandmother   . Heart disease Maternal Grandfather   . Peripheral vascular disease Paternal Grandmother        s/p leg amputation  . Stroke Paternal Grandfather     Allergies  Allergen Reactions  . Codeine Nausea And Vomiting  . Hornet Venom   . Penicillins Itching and Swelling    Has patient had a PCN reaction causing immediate rash, facial/tongue/throat swelling, SOB or lightheadedness with hypotension: Yes Has patient had a PCN reaction causing severe rash involving mucus membranes or skin necrosis: No Has patient had a PCN reaction that required hospitalization: No Has patient had a PCN reaction occurring within the last 10 years: No If all of the above answers are "NO", then may proceed with Cephalosporin use.   . [place] Venom Swelling  . Lodine [Etodolac] Rash  . Tramadol Itching and Rash    Medication list has been reviewed and updated.  Current Outpatient Medications on File Prior to Visit   Medication Sig Dispense Refill  . carbidopa-levodopa (SINEMET IR) 25-100 MG per tablet Take 1 tablet by mouth 4 (four) times daily.     . Carbidopa-Levodopa ER (SINEMET CR) 25-100 MG tablet controlled release Take 1 tablet by mouth at bedtime.    . diclofenac (VOLTAREN) 50 MG EC tablet Take 50 mg by mouth 2 (two) times daily.    Marland Kitchen estradiol (ESTRACE) 0.5 MG tablet Take 1/2 tab po q day (Patient taking differently: Take 0.25 mg by mouth daily. Take 1/2 tab po q day) 45 tablet 4  . fluticasone (CUTIVATE) 0.05 % cream Apply 1 application topically daily as needed (scalp psoriasis).   1  . golimumab (SIMPONI ARIA) 50 MG/4ML SOLN injection Inject 50 mg into the vein See admin instructions. Given every 2 months - Last given 06/28/2019    . hydrochlorothiazide (HYDRODIURIL) 25 MG tablet TAKE 1 AND 1/2 TABLETS DAILY BY MOUTH 135 tablet 1  . medroxyPROGESTERone (PROVERA) 2.5 MG tablet Take 0.5 tablets (1.25 mg total) by mouth daily. 45 tablet 4  . sertraline (ZOLOFT) 25 MG tablet TAKE 1 TABLET BY MOUTH EVERY DAY IN THE MORNING 90 tablet 1  . Vitamin D, Ergocalciferol, (DRISDOL) 1.25 MG (50000 UNIT) CAPS capsule TAKE 1 CAPSULE (50,000 UNITS TOTAL) BY MOUTH EVERY 7 (SEVEN) DAYS. 12 capsule 1  . diazepam (VALIUM) 5 MG tablet TAKE 1 TABLET (5 MG TOTAL) BY MOUTH EVERY 12 (TWELVE) HOURS AS NEEDED. FOR ANXIETY (Patient taking differently: 0.25 mg. ) 60 tablet 0   No current facility-administered medications on file prior to visit.    Review of Systems:  As per HPI- otherwise negative.   Physical Examination: Vitals:   06/02/20 1555  BP: (!) 158/82  Pulse: 88  Resp: 16  SpO2: 98%   Vitals:   06/02/20 1555  Weight: 124 lb (56.2 kg)  Height: 5\' 3"  (1.6 m)   Body mass index is 21.97 kg/m. Ideal Body Weight: Weight in (lb) to have BMI = 25: 140.8  GEN: no acute distress.  Looks well and significantly younger than age, mild Parkinson's tremor is present HEENT: Atraumatic, Normocephalic.  Ears and  Nose: No external deformity. CV: RRR, No M/G/R. No JVD.  No thrill. No extra heart sounds. PULM: CTA B, no wheezes, crackles, rhonchi. No retractions. No resp. distress. No accessory muscle use. EXTR: No c/c/e PSYCH: Normally interactive. Conversant.    Assessment and Plan: Essential hypertension - Plan: amLODipine (NORVASC) 2.5 MG tablet  Patient here today to discuss hypertension.  She has been previously well controlled on hydrochlorothiazide, recently however her blood pressures are running a bit higher.  She is uneasy with her pressure being this high and would like to treat it.  I will add amlodipine 2.5 mg to her regimen.  Gave her blood pressure parameters and she will continue to monitor at home.  I have cautioned her that hypotension is also risky especially at her age, if she starts to run too low she will stop amlodipine and let us know  This visit occurred during the SARS-CoV-2 public health emergency.  Safety protocols were in place, including screening questions prior to the visit, additional usage of staff PPE, and extensive cleaning of exam room while observing appropriate contact time as indicated for disinfecting solutions.    Signed Lamar Blinks, MD

## 2020-06-02 ENCOUNTER — Other Ambulatory Visit: Payer: Self-pay

## 2020-06-02 ENCOUNTER — Ambulatory Visit (INDEPENDENT_AMBULATORY_CARE_PROVIDER_SITE_OTHER): Payer: Medicare Other | Admitting: Family Medicine

## 2020-06-02 ENCOUNTER — Encounter: Payer: Self-pay | Admitting: Family Medicine

## 2020-06-02 VITALS — BP 158/82 | HR 88 | Resp 16 | Ht 63.0 in | Wt 124.0 lb

## 2020-06-02 DIAGNOSIS — I10 Essential (primary) hypertension: Secondary | ICD-10-CM

## 2020-06-02 MED ORDER — AMLODIPINE BESYLATE 2.5 MG PO TABS: 2.5000 mg | ORAL_TABLET | Freq: Every day | ORAL | 5 refills | Status: DC

## 2020-06-02 NOTE — Patient Instructions (Signed)
It was great to see you today- we will add amlodipine 2.5 mg to your current regimen BP goal 125- 140/70-90.  If you are running too high or too low please let me know and I will adjust your medication

## 2020-06-05 ENCOUNTER — Encounter: Payer: Self-pay | Admitting: Family Medicine

## 2020-06-19 ENCOUNTER — Telehealth (INDEPENDENT_AMBULATORY_CARE_PROVIDER_SITE_OTHER): Payer: Medicare Other | Admitting: Family Medicine

## 2020-06-19 ENCOUNTER — Other Ambulatory Visit: Payer: Self-pay

## 2020-06-19 DIAGNOSIS — I10 Essential (primary) hypertension: Secondary | ICD-10-CM

## 2020-06-19 MED ORDER — AMLODIPINE BESYLATE 2.5 MG PO TABS: 2.5000 mg | ORAL_TABLET | Freq: Every day | ORAL | 5 refills | Status: DC

## 2020-06-19 NOTE — Progress Notes (Signed)
Virtual Visit via Video Note  I connected with Stephanie [name] on 06/19/20 at 11:40 AM EDT by a video enabled telemedicine application and verified that I am speaking with the correct person using two identifiers.  Location: Patient: home, patient and provider are in visit Provider: home   I discussed the limitations of evaluation and management by telemedicine and the availability of in person appointments. The patient expressed understanding and agreed to proceed. Dimple Nanas, CMA was able to get the patient setup on a video visit    Subjective:    Patient ID: Stephanie [name], female    DOB: June 20, 1932, 86 y.o.   MRN: [id number]  Chief Complaint  Patient presents with  . Follow-up    HPI Patient is in today for follow up on hypertension. Blood pressure had been trending up and was in the high 150s so she presented to the office for follow-up and had amlodipine 2.5 mg added daily.  She has noted generally really improved 2 days ago her blood pressure was 110/70 but then she also had a 147/84 and a 159/84. No recent febrile illness or hospitalizations. Denies CP/palp/SOB/HA/congestion/fevers/GI or GU c/o. Taking meds as prescribed  Past Medical History:  Diagnosis Date  . Arthropathy of cervical spine 11/27/2012   Right  Xray at [facility]  On 04/08/2016  AP, lateral, and lateral flexion and extension views of the cervical spine are submitted for evaluation.   No acute fracture identified in the cervical spine.   There is redemonstration of approximately 2 mm of C3 on C4 anterolisthesis in neutral which slightly increases in flexion relative to neutral and extension. Slight C5 on C6 retrolisthesis does not change i  . Benign hypertension without congestive heart failure   . Benign mole    right hcets mole, bleeds when dried with towel  . Bilateral dry eyes   . Bladder cancer ([place]) 11-16-16  . Cervical spondylosis   . Cervicalgia    2 screws in place   . Depression with anxiety  Nov 16, 2016   prior to husbands death   . Gross hematuria    developed after first hip replacment, led to indicental finding of bladder tumor, bleeding now resolved   . History of kidney stones    1970's  . Left hand weakness    due to reinjury of flxor tendon s/p tendon surgery march 12-2018  . Lumbar stenosis   . Malignant tumor of urinary bladder ([place]) 07/2016   Pre-stage I  . OA (osteoarthritis)   . Parkinson disease [facility]) dx 2012   neurologist-  dr siddiqui at [facility]  . Psoriatic arthritis [facility])     Dr Trudie Reed , [place] [facility]   . Rupture of flexor tendon of hand 2020   right    Past Surgical History:  Procedure Laterality Date  . BIOPSY MASS LEFT FIRST METACARPAL   06/23/2006   benign  . CATARACT EXTRACTION W/ INTRAOCULAR LENS  IMPLANT, BILATERAL  1999 and 2003  . CERVICAL FUSION  02/2017   c1-c2 ; reports she has 2 screws 2in long in place done at [facility] ; patient exhibits VERY LIMITED NECK ROM  . CYSTOSCOPY W/ RETROGRADES Bilateral 09/22/2016   Procedure: CYSTOSCOPY WITH RETROGRADE PYELOGRAM;  Surgeon: Alexis Frock, MD;  Location: [facility];  Service: Urology;  Laterality: Bilateral;  . D & C HYSTEROSCOPY W/ RESECTION POLYP  07/11/2000  . DILATION AND CURETTAGE OF UTERUS  1960's  . EXCISION CYST AND DEBRIDEMENT RIGHT  [place] BODY  01/09/2009  . FLEXOR TENDON REPAIR Left 12/12/2018   Procedure: REPAIR/TRANSFER FLEXOR DIGITORUM PROFUNDUS OF LEFT SMALL FINGER;  Surgeon: Daryll Brod, MD;  Location: [facility];  Service: Orthopedics;  Laterality: Left;  . LAPAROSCOPY  yrs ago   infertility   . METACARPOPHALANGEAL JOINT ARTHRODESIS Right 05/25/1996  . TENDON REPAIR Left 01/15/2019   Hand  . TONSILLECTOMY AND ADENOIDECTOMY  child  . TOTAL HIP ARTHROPLASTY Left 07/07/2016   Procedure: LEFT TOTAL HIP ARTHROPLASTY ANTERIOR APPROACH;  Surgeon: Gaynelle Arabian, MD;  Location: [facility];  Service: Orthopedics;  Laterality: Left;    . TOTAL HIP ARTHROPLASTY Right 09/28/2017   Procedure: RIGHT TOTAL HIP ARTHROPLASTY ANTERIOR APPROACH;  Surgeon: Gaynelle Arabian, MD;  Location: [facility];  Service: Orthopedics;  Laterality: Right;  . TOTAL KNEE ARTHROPLASTY Right 07/30/2019   Procedure: TOTAL KNEE ARTHROPLASTY;  Surgeon: Gaynelle Arabian, MD;  Location: [facility];  Service: Orthopedics;  Laterality: Right;  52min  . TRANSURETHRAL RESECTION OF BLADDER TUMOR N/A 09/22/2016   Procedure: TRANSURETHRAL RESECTION OF BLADDER TUMOR (TURBT);  Surgeon: Alexis Frock, MD;  Location: [facility];  Service: Urology;  Laterality: N/A;    Family History  Problem Relation Age of Onset  . Arthritis Mother   . Hypertension Mother   . Deep vein thrombosis Mother        recurrent, secondary to Bleeding disorder  . Arthritis Father   . Stroke Father   . Diabetes Sister   . Heart disease Sister   . Obesity Sister   . Arthritis Sister   . Hypertension Sister   . Heart disease Maternal Grandmother   . Heart disease Maternal Grandfather   . Peripheral vascular disease Paternal Grandmother        s/p leg amputation  . Stroke Paternal Grandfather     Social History   Socioeconomic History  . Marital status: Widowed    Spouse name: Not on file  . Number of children: Not on file  . Years of education: Not on file  . Highest education level: Not on file  Occupational History  . Not on file  Tobacco Use  . Smoking status: Former Smoker    Years: 10.00    Quit date: 05/04/1964    Years since quitting: 56.1  . Smokeless tobacco: Never Used  Vaping Use  . Vaping Use: Never used  Substance and Sexual Activity  . Alcohol use: Yes    Alcohol/week: 7.0 standard drinks    Types: 7 Glasses of wine per week  . Drug use: No  . Sexual activity: Not Currently    Partners: Male  Other Topics Concern  . Not on file  Social History Narrative  . Not on file   Social Determinants of Health   Financial Resource Strain: Low Risk    . Difficulty of Paying Living Expenses: Not hard at all  Food Insecurity: No Food Insecurity  . Worried About Charity fundraiser in the Last Year: Never true  . Ran Out of Food in the Last Year: Never true  Transportation Needs: No Transportation Needs  . Lack of Transportation (Medical): No  . Lack of Transportation (Non-Medical): No  Physical Activity:   . Days of Exercise per Week:   . Minutes of Exercise per Session:   Stress:   . Feeling of Stress :   Social Connections:   . Frequency of Communication with Friends and Family:   . Frequency of Social  Gatherings with Friends and Family:   . Attends Religious Services:   . Active Member of Clubs or Organizations:   . Attends Archivist Meetings:   Marland Kitchen Marital Status:   Intimate Partner Violence:   . Fear of Current or Ex-Partner:   . Emotionally Abused:   Marland Kitchen Physically Abused:   . Sexually Abused:     Outpatient Medications Prior to Visit  Medication Sig Dispense Refill  . carbidopa-levodopa (SINEMET IR) 25-100 MG per tablet Take 1 tablet by mouth 4 (four) times daily.     . Carbidopa-Levodopa ER (SINEMET CR) 25-100 MG tablet controlled release Take 1 tablet by mouth at bedtime.    . diazepam (VALIUM) 5 MG tablet TAKE 1 TABLET (5 MG TOTAL) BY MOUTH EVERY 12 (TWELVE) HOURS AS NEEDED. FOR ANXIETY (Patient taking differently: 0.25 mg. ) 60 tablet 0  . diclofenac (VOLTAREN) 50 MG EC tablet Take 50 mg by mouth 2 (two) times daily.    Marland Kitchen estradiol (ESTRACE) 0.5 MG tablet Take 1/2 tab po q day (Patient taking differently: Take 0.25 mg by mouth daily. Take 1/2 tab po q day) 45 tablet 4  . fluticasone (CUTIVATE) 0.05 % cream Apply 1 application topically daily as needed (scalp psoriasis).   1  . golimumab (SIMPONI ARIA) 50 MG/4ML SOLN injection Inject 50 mg into the vein See admin instructions. Given every 2 months - Last given 06/28/2019    . hydrochlorothiazide (HYDRODIURIL) 25 MG tablet TAKE 1 AND 1/2 TABLETS DAILY BY MOUTH 135  tablet 1  . medroxyPROGESTERone (PROVERA) 2.5 MG tablet Take 0.5 tablets (1.25 mg total) by mouth daily. 45 tablet 4  . sertraline (ZOLOFT) 25 MG tablet TAKE 1 TABLET BY MOUTH EVERY DAY IN THE MORNING 90 tablet 1  . Vitamin D, Ergocalciferol, (DRISDOL) 1.25 MG (50000 UNIT) CAPS capsule TAKE 1 CAPSULE (50,000 UNITS TOTAL) BY MOUTH EVERY 7 (SEVEN) DAYS. 12 capsule 1  . amLODipine (NORVASC) 2.5 MG tablet Take 1 tablet (2.5 mg total) by mouth daily. 30 tablet 5   No facility-administered medications prior to visit.    Allergies  Allergen Reactions  . Codeine Nausea And Vomiting  . Hornet Venom   . Penicillins Itching and Swelling    Has patient had a PCN reaction causing immediate rash, facial/tongue/throat swelling, SOB or lightheadedness with hypotension: Yes Has patient had a PCN reaction causing severe rash involving mucus membranes or skin necrosis: No Has patient had a PCN reaction that required hospitalization: No Has patient had a PCN reaction occurring within the last 10 years: No If all of the above answers are "NO", then may proceed with Cephalosporin use.   . Yellow Jacket Venom Swelling  . Lodine [Etodolac] Rash  . Tramadol Itching and Rash    Review of Systems  Constitutional: Negative for fever and malaise/fatigue.  HENT: Negative for congestion.   Eyes: Negative for blurred vision.  Respiratory: Negative for shortness of breath.   Cardiovascular: Negative for chest pain, palpitations and leg swelling.  Gastrointestinal: Negative for abdominal pain, blood in stool and nausea.  Genitourinary: Negative for dysuria and frequency.  Musculoskeletal: Negative for falls.  Skin: Negative for rash.  Neurological: Negative for dizziness, loss of consciousness and headaches.  Endo/Heme/Allergies: Negative for environmental allergies.  Psychiatric/Behavioral: Negative for depression. The patient is not nervous/anxious.        Objective:    Physical Exam Constitutional:       Appearance: Normal appearance. She is not ill-appearing.  HENT:  Head: Normocephalic and atraumatic.     Right Ear: External ear normal.     Left Ear: External ear normal.     Nose: Nose normal.  Eyes:     General:        Right eye: No discharge.        Left eye: No discharge.  Pulmonary:     Effort: Pulmonary effort is normal.  Neurological:     Mental Status: She is alert and oriented to person, place, and time.  Psychiatric:        Behavior: Behavior normal.     BP (!) 147/84   Pulse 78   LMP 01/13/2013 Comment: spotting-had benign endometrial biopsy  Wt Readings from Last 3 Encounters:  06/02/20 124 lb (56.2 kg)  02/14/20 126 lb 12.8 oz (57.5 kg)  12/27/19 126 lb 3.2 oz (57.2 kg)    Diabetic Foot Exam - Simple   No data filed     Lab Results  Component Value Date   WBC 5.0 05/30/2020   HGB 14.7 05/30/2020   HCT 42 05/30/2020   PLT 184 05/30/2020   GLUCOSE 92 02/14/2020   CHOL 173 12/31/2019   TRIG 85 12/31/2019   HDL 58 12/31/2019   LDLDIRECT 120.0 12/11/2014   LDLCALC 99 12/31/2019   ALT 20 05/30/2020   AST 54 (A) 05/30/2020   NA 140 05/30/2020   K 3.9 05/30/2020   CL 96 (A) 05/30/2020   CREATININE 0.8 05/30/2020   BUN 25 (A) 05/30/2020   CO2 18 05/30/2020   TSH 1.40 02/14/2020   INR 1.0 07/25/2019   HGBA1C 5.1 02/14/2020    Lab Results  Component Value Date   TSH 1.40 02/14/2020   Lab Results  Component Value Date   WBC 5.0 05/30/2020   HGB 14.7 05/30/2020   HCT 42 05/30/2020   MCV 92.6 02/14/2020   PLT 184 05/30/2020   Lab Results  Component Value Date   NA 140 05/30/2020   K 3.9 05/30/2020   CO2 18 05/30/2020   GLUCOSE 92 02/14/2020   BUN 25 (A) 05/30/2020   CREATININE 0.8 05/30/2020   BILITOT 0.8 02/14/2020   ALKPHOS 45 05/30/2020   AST 54 (A) 05/30/2020   ALT 20 05/30/2020   PROT 6.4 02/14/2020   ALBUMIN 5.2 (H) 07/25/2019   CALCIUM 9.8 05/30/2020   ANIONGAP 10 07/31/2019   GFR 72.60 03/13/2018   Lab Results    Component Value Date   CHOL 173 12/31/2019   Lab Results  Component Value Date   HDL 58 12/31/2019   Lab Results  Component Value Date   LDLCALC 99 12/31/2019   Lab Results  Component Value Date   TRIG 85 12/31/2019   Lab Results  Component Value Date   CHOLHDL 3.0 12/31/2019   Lab Results  Component Value Date   HGBA1C 5.1 02/14/2020       Assessment & Plan:   Problem List Items Addressed This Visit    HTN (hypertension)    Blood pressure had been trending up and was in the high 150s so she presented to the office for follow-up and had amlodipine 2.5 mg added daily.  She has noted generally really improved 2 days ago her blood pressure was 110/70 but then she also had a 147/84 and a 159/84. Encouraged heart healthy diet such as the DASH diet and exercise as tolerated. Will have her move the Amlodipine qhs and if still high then increase the 2.5 mg dose to  bid and let us know her BP, pulse and symptoms in about a week. Call to talk sooner if she has any concerning symptoms. Spent 20 minutes discussing history and formulating plan with patient.      Relevant Medications   amLODipine (NORVASC) 2.5 MG tablet      I have changed Armanda Magic. Whittle's amLODipine. I am also having her maintain her carbidopa-levodopa, fluticasone, Simponi Aria, estradiol, medroxyPROGESTERone, Carbidopa-Levodopa ER, hydrochlorothiazide, diclofenac, diazepam, sertraline, and Vitamin D (Ergocalciferol).  Meds ordered this encounter  Medications  . amLODipine (NORVASC) 2.5 MG tablet    Sig: Take 1 tablet (2.5 mg total) by mouth at bedtime.    Dispense:  30 tablet    Refill:  5    I discussed the assessment and treatment plan with the patient. The patient was provided an opportunity to ask questions and all were answered. The patient agreed with the plan and demonstrated an understanding of the instructions.   The patient was advised to call back or seek an in-person evaluation if the symptoms  worsen or if the condition fails to improve as anticipated.  I provided 20 minutes of non-face-to-face time during this encounter.   Penni Homans, MD

## 2020-06-19 NOTE — Assessment & Plan Note (Addendum)
Blood pressure had been trending up and was in the high 150s so she presented to the office for follow-up and had amlodipine 2.5 mg added daily.  She has noted generally really improved 2 days ago her blood pressure was 110/70 but then she also had a 147/84 and a 159/84. Encouraged heart healthy diet such as the DASH diet and exercise as tolerated. Will have her move the Amlodipine qhs and if still high then increase the 2.5 mg dose to bid and let us know her BP, pulse and symptoms in about a week. Call to talk sooner if she has any concerning symptoms. Spent 20 minutes discussing history and formulating plan with patient.

## 2020-06-24 ENCOUNTER — Ambulatory Visit: Payer: Medicare Other | Admitting: Family Medicine

## 2020-06-25 DIAGNOSIS — R7989 Other specified abnormal findings of blood chemistry: Secondary | ICD-10-CM | POA: Diagnosis not present

## 2020-06-26 ENCOUNTER — Encounter: Payer: Self-pay | Admitting: Family Medicine

## 2020-07-08 ENCOUNTER — Telehealth: Payer: Self-pay | Admitting: Pharmacist

## 2020-07-08 DIAGNOSIS — Z03818 Encounter for observation for suspected exposure to other biological agents ruled out: Secondary | ICD-10-CM | POA: Diagnosis not present

## 2020-07-08 DIAGNOSIS — Z20822 Contact with and (suspected) exposure to covid-19: Secondary | ICD-10-CM | POA: Diagnosis not present

## 2020-07-08 NOTE — Progress Notes (Addendum)
Chronic Care Management Pharmacy Assistant   Name: Stephanie [name]  MRN: [id number] DOB: 11/04/1932  Reason for Encounter: Initial Question    PCP : Mosie Lukes, MD  Allergies:   Allergies  Allergen Reactions  . Codeine Nausea And Vomiting  . Hornet Venom   . Penicillins Itching and Swelling    Has patient had a PCN reaction causing immediate rash, facial/tongue/throat swelling, SOB or lightheadedness with hypotension: Yes Has patient had a PCN reaction causing severe rash involving mucus membranes or skin necrosis: No Has patient had a PCN reaction that required hospitalization: No Has patient had a PCN reaction occurring within the last 10 years: No If all of the above answers are "NO", then may proceed with Cephalosporin use.   . Yellow Jacket Venom Swelling  . Lodine [Etodolac] Rash  . Tramadol Itching and Rash    Medications: Outpatient Encounter Medications as of 07/08/2020  Medication Sig Note  . amLODipine (NORVASC) 2.5 MG tablet Take 1 tablet (2.5 mg total) by mouth at bedtime.   . carbidopa-levodopa (SINEMET IR) 25-100 MG per tablet Take 1 tablet by mouth 4 (four) times daily.  07/19/2019: THROUGHOUT THE DAY   . Carbidopa-Levodopa ER (SINEMET CR) 25-100 MG tablet controlled release Take 1 tablet by mouth at bedtime. 07/19/2019: BEDTIME  . diazepam (VALIUM) 5 MG tablet TAKE 1 TABLET (5 MG TOTAL) BY MOUTH EVERY 12 (TWELVE) HOURS AS NEEDED. FOR ANXIETY (Patient taking differently: 0.25 mg. )   . diclofenac (VOLTAREN) 50 MG EC tablet Take 50 mg by mouth 2 (two) times daily.   Marland Kitchen estradiol (ESTRACE) 0.5 MG tablet Take 1/2 tab po q day (Patient taking differently: Take 0.25 mg by mouth daily. Take 1/2 tab po q day)   . fluticasone (CUTIVATE) 0.05 % cream Apply 1 application topically daily as needed (scalp psoriasis).    Marland Kitchen golimumab (SIMPONI ARIA) 50 MG/4ML SOLN injection Inject 50 mg into the vein See admin instructions. Given every 2 months - Last given 06/28/2019   .  hydrochlorothiazide (HYDRODIURIL) 25 MG tablet TAKE 1 AND 1/2 TABLETS DAILY BY MOUTH   . medroxyPROGESTERone (PROVERA) 2.5 MG tablet Take 0.5 tablets (1.25 mg total) by mouth daily.   . sertraline (ZOLOFT) 25 MG tablet TAKE 1 TABLET BY MOUTH EVERY DAY IN THE MORNING   . Vitamin D, Ergocalciferol, (DRISDOL) 1.25 MG (50000 UNIT) CAPS capsule TAKE 1 CAPSULE (50,000 UNITS TOTAL) BY MOUTH EVERY 7 (SEVEN) DAYS.    No facility-administered encounter medications on file as of 07/08/2020.    Current Diagnosis: Patient Active Problem List   Diagnosis Date Noted  . OA (osteoarthritis) of knee 07/30/2019  . Tinea corporis 06/08/2019  . Psoriatic arthritis ([place]) 04/02/2019  . Anemia 03/13/2018  . Hot flashes 03/13/2018  . S/P cervical spinal fusion 03/31/2017  . Neck pain 12/12/2016  . Depression with anxiety 10/17/2016  . Bladder cancer ([place]) 10/17/2016  . OA (osteoarthritis) of hip 07/07/2016  . Spinal stenosis of lumbar region with radiculopathy 04/08/2016  . Spondylolisthesis of lumbar region 04/08/2016  . Spondylosis of cervical region without myelopathy or radiculopathy 04/08/2016  . Preventative health care 01/18/2016  . Low back pain 01/18/2016  . History of chicken pox   . Hyperlipidemia 12/15/2014  . Allergic rhinitis 06/25/2014  . Allergy to bee sting 06/10/2014  . TMJ tenderness 11/16/2013  . Other malaise and fatigue 05/07/2013  . Arthropathy of cervical spine ([place]) 11/27/2012  . Muscle spasms of neck 11/27/2012  . Parkinson's  disease ([place]) 05/24/2012  . Conjunctivochalasis 03/09/2012  . Epiretinal membrane 03/09/2012  . Hyperopia with astigmatism and presbyopia 03/09/2012  . Pseudophakia 03/09/2012  . HTN (hypertension) 08/31/2011  . Right knee pain 08/31/2011  . Benign hypertensive heart disease without heart failure 05/12/2011    Goals Addressed   None     Have you seen any other providers since your last visit?Yes. 06-19-2020 Video Visit (PCP) Patient was seen for a  follow up on her blood pressure. The notes indicate the patient's blood pressure has been trending high in the 150's.  Any changes in your medications or health? Starting taking 2.5mg  of amlodipine two times day.  Any side effects from any medications? No  Do you have an symptoms or problems not managed by your medications? No  Any concerns about your health right now? Patient states she has been battling a cold for about a week. She went to [place] for a funeral, and since her return she has been sick. She did take a Covid test and the results were negative.  Has your provider asked that you check blood pressure, blood sugar, or follow special diet at home? Yes, blood pressure.  Do you get any type of exercise on a regular basis? No  Can you think of a goal you would like to reach for your health? Not at this time.  Do you have any problems getting your medications? No  Is there anything that you would like to discuss during the appointment? Patient inquiring if a antibiotic she took from the dentist office could be causing loose bowels or would it be from her cold. Patient also want to discuss her baby Asprin use. She stated Dr. Charlett Blake recommended that she start taking it, but she hasn't because she bruises very easily.   Asked patient to please have medications and supplements available at the time of the appointment.   Follow-Up:  Pharmacist Review   Fanny Skates, [place] Pharmacist Assistant [phone]  Reviewed by: De Blanch, PharmD Clinical Pharmacist [facility] [phone]

## 2020-07-09 ENCOUNTER — Other Ambulatory Visit: Payer: Self-pay | Admitting: Family Medicine

## 2020-07-09 MED ORDER — BENZONATATE 100 MG PO CAPS
100.0000 mg | ORAL_CAPSULE | Freq: Three times a day (TID) | ORAL | 1 refills | Status: DC | PRN
Start: 2020-07-09 — End: 2020-09-02

## 2020-07-10 ENCOUNTER — Other Ambulatory Visit: Payer: Self-pay

## 2020-07-10 ENCOUNTER — Ambulatory Visit: Payer: Medicare Other | Admitting: Pharmacist

## 2020-07-10 DIAGNOSIS — E7800 Pure hypercholesterolemia, unspecified: Secondary | ICD-10-CM

## 2020-07-10 DIAGNOSIS — I10 Essential (primary) hypertension: Secondary | ICD-10-CM

## 2020-07-10 NOTE — Chronic Care Management (AMB) (Signed)
Chronic Care Management Pharmacy  Name: Stephanie [name]  MRN: [id number] DOB: [birth year]   Chief Complaint/ HPI  Stephanie [name],  86 y.o. , female presents for their Initial CCM visit with the clinical pharmacist via telephone due to COVID-19 Pandemic.  PCP : Mosie Lukes, MD  Their chronic conditions include: Hypertension, Hyperlipidemia, Parkinson's Disease, Depression/Anxiety, Hot flashes, Psoriatic Arthritis, Pain  Office Visits: 06/19/20: Visit w/ Dr. Charlett Blake -  Move amlodipine to 2.81m HS. If still elevated increase to amlodipine 2.587mtwice daily.  06/02/20: Visit w/ Dr. CoLorelei Pont Amlodipine 2.54m1maily added for elevated BP.  Consult Visit: 05/01/20: Rheumatology visit w/ Dr. HawTrudie Reed/16/21: Neurology visit w/ Dr. SidLinus MakoRemain on current regimen for next 3 months then switch to [place] Medications: Outpatient Encounter Medications as of 07/10/2020  Medication Sig Note  . benzonatate (TESSALON PERLES) 100 MG capsule Take 1 capsule (100 mg total) by mouth 3 (three) times daily as needed for cough.   . carbidopa-levodopa (SINEMET IR) 25-100 MG per tablet Take 1 tablet by mouth 4 (four) times daily.  07/19/2019: THROUGHOUT THE Adell Koval   . Carbidopa-Levodopa ER (SINEMET CR) 25-100 MG tablet controlled release Take 1 tablet by mouth at bedtime. 07/19/2019: BEDTIME  . diclofenac (VOLTAREN) 50 MG EC tablet Take 50 mg by mouth 2 (two) times daily.   . eMarland Kitchentradiol (ESTRACE) 0.5 MG tablet Take 1/2 tab po q Danniel Grenz (Patient taking differently: Take 0.25 mg by mouth daily. Take 1/2 tab po q Pamelia Botto)   . golimumab (SIMPONI ARIA) 50 MG/4ML SOLN injection Inject 50 mg into the vein See admin instructions. Given every 2 months - Last given 06/28/2019   . medroxyPROGESTERone (PROVERA) 2.5 MG tablet Take 0.5 tablets (1.25 mg total) by mouth daily.   . sertraline (ZOLOFT) 25 MG tablet TAKE 1 TABLET BY MOUTH EVERY Kenyon Eshleman IN THE MORNING   . [DISCONTINUED] amLODipine (NORVASC) 2.5 MG tablet Take 1 tablet (2.5 mg  total) by mouth at bedtime.   . [DISCONTINUED] diazepam (VALIUM) 5 MG tablet TAKE 1 TABLET (5 MG TOTAL) BY MOUTH EVERY 12 (TWELVE) HOURS AS NEEDED. FOR ANXIETY (Patient taking differently: 0.25 mg. )   . [DISCONTINUED] hydrochlorothiazide (HYDRODIURIL) 25 MG tablet TAKE 1 AND 1/2 TABLETS DAILY BY MOUTH   . fluticasone (CUTIVATE) 0.05 % cream Apply 1 application topically daily as needed (scalp psoriasis).    . Vitamin D, Ergocalciferol, (DRISDOL) 1.25 MG (50000 UNIT) CAPS capsule TAKE 1 CAPSULE (50,000 UNITS TOTAL) BY MOUTH EVERY 7 (SEVEN) DAYS.    No facility-administered encounter medications on file as of 07/10/2020.   SDOH Screenings   Alcohol Screen:   . Last Alcohol Screening Score (AUDIT): Not on file  Depression (PHQ2-9): Low Risk   . PHQ-2 Score: 0  Financial Resource Strain: Low Risk   . Difficulty of Paying Living Expenses: Not hard at all  Food Insecurity: No Food Insecurity  . Worried About RunCharity fundraiser the Last Year: Never true  . Ran Out of Food in the Last Year: Never true  Housing: Low Risk   . Last Housing Risk Score: 0  Physical Activity:   . Days of Exercise per Week: Not on file  . Minutes of Exercise per Session: Not on file  Social Connections: Moderately Integrated  . Frequency of Communication with Friends and Family: Three times a week  . Frequency of Social Gatherings with Friends and Family: Once a week  . Attends Religious Services: More  than 4 times per year  . Active Member of Clubs or Organizations: Yes  . Attends Archivist Meetings: Not on file  . Marital Status: Widowed  Stress:   . Feeling of Stress : Not on file  Tobacco Use: Medium Risk  . Smoking Tobacco Use: Former Smoker  . Smokeless Tobacco Use: Never Used  Transportation Needs: No Transportation Needs  . Lack of Transportation (Medical): No  . Lack of Transportation (Non-Medical): No     Current Diagnosis/Assessment:  Goals Addressed            This Visit's  Progress   . Chronic Care Management Pharmacy Care Plan       CARE PLAN ENTRY (see longitudinal plan of care for additional care plan information)  Current Barriers:  . Chronic Disease Management support, education, and care coordination needs related to Hypertension, Hyperlipidemia, Parkinson's Disease, Depression/Anxiety, Hot flashes, Psoriatic Arthritis, Pain   Hypertension BP Readings from Last 3 Encounters:  06/19/20 (!) 147/84  06/02/20 (!) 158/82  02/14/20 132/79   . Pharmacist Clinical Goal(s): o Over the next 90 days, patient will work with PharmD and providers to achieve BP goal <140/90 . Current regimen:  . Amlodipine 2.94m twice daily . HCTZ 266m1.5 tabs daily . Interventions: o Discussed BP goal . Patient self care activities - Over the next 90 days, patient will: o Check BP daily, document, and provide at future appointments o Ensure daily salt intake < 2300 mg/Cheyane Ayon  Hyperlipidemia Lab Results  Component Value Date/Time   LDLCALC 99 12/31/2019 08:52 AM   LDLDIRECT 120.0 12/11/2014 10:40 AM   . Pharmacist Clinical Goal(s): o Over the next 90 days, patient will work with PharmD and providers to maintain LDL goal < 100 . Current regimen:  o Diet and exercise management   . Interventions: o Discussed diet and exercise . Patient self care activities - Over the next 90 days, patient will: o Continue to monitor diet and exercise  Medication management . Pharmacist Clinical Goal(s): o Over the next 90 days, patient will work with PharmD and providers to maintain optimal medication adherence . [facility] . Interventions o Comprehensive medication review performed. o Continue current medication management strategy . Patient self care activities - Over the next 90 days, patient will: o Focus on medication adherence by filling and taking medications appropriately  o Take medications as prescribed o Report any questions or concerns to PharmD and/or  provider(s)  Initial goal documentation      Social Hx:  Husband died in 2016-Mar-2017rom Parkinson's.  Is followed for Parkinson's every 6 months and was told she was very high functioning for [age]ear old.  Lives alone Has a daughter, two grandsons, and a grand-dog. One of her grandson's is a coEngineer, manufacturingnd is getting a scholarship to [place] sister who she speaks [place]. Retired Does sudoku and love to watch sports (basketball, football, baseball) She played fast pitched softball.   Hypertension   BP goal is:  <140/90  Office blood pressures are  BP Readings from Last 3 Encounters:  06/19/20 (!) 147/84  06/02/20 (!) 158/82  02/14/20 132/79   Patient checks BP at home daily Patient home BP readings are ranging:  134 75 77 95 147 77 82 97 140 68  76 97 144 77 75 98 144 77 75 98  129 76 86 97 Avg 139.6 75 78.5 97  Patient has failed these  meds in the past: None noted  Patient is currently controlled on the following medications:  . Amlodipine 2.93m twice daily . HCTZ 235m1.5 tabs daily  We discussed BP goal  Plan -Continue current medications     Hyperlipidemia   LDL goal <100  Lipid Panel     Component Value Date/Time   CHOL 173 12/31/2019 0852   TRIG 85 12/31/2019 0852   HDL 58 12/31/2019 0852   LDLCALC 99 12/31/2019 0852   LDLDIRECT 120.0 12/11/2014 1040    Hepatic Function Latest Ref Rng & Units 05/30/2020 02/14/2020 12/31/2019  Total Protein 6.1 - 8.1 g/dL - 6.4 -  Albumin 3.5 - 5.0 g/dL - - -  AST 13 - 35 54(A) 27 -  ALT 7 - 35 20 14 6   Alk Phosphatase 25 - 125 45 - -  Total Bilirubin 0.2 - 1.2 mg/dL - 0.8 -  Bilirubin, Direct 0.0 - 0.3 mg/dL - - -     The ASCVD Risk score (GoKingman et al., 2013) failed to calculate for the following reasons:   The 2013 ASCVD risk score is only valid for ages 4065o 7937 Patient has failed these meds in past: None noted  Patient is  currently controlled on the following medications:  . None  We discussed:  diet and exercise extensively   Exercise Has not been walking due to the heat and cold. Does stretching exercises in bed.  Diet B - yogurt and 1/2 banana and half peach with granola, almonds, and cinnamon, orange juice, black coffee L - 1/2 sandwich, tortilla chips, carrot stick, piece of fruit (she really likes dried apricots), green tea D - Split pea soup, green salad, cornbread, watermelon for dessert Snacks - not usually  Plan -Continue control with diet and exercise  Parkinson's Disease    Patient has failed these meds in past: None noted  Patient is currently controlled on the following medications: . Carbidopa-levodopa ER 25-10087mt bedtime . Carbidopa-levodopa IR 25-100m7mur times daily  Sometimes she doesn't take all 4 of her IR tabs.  She states she gets dyskinesia if she takes a whole tab every 4 hours, so she takes 1/2 of an IR tab every 2 hours.  Plan -Continue current medications   Depression/Anxiety   Depression screen PHQ [facility] 07/10/2020 03/07/2020 02/14/2020  Decreased Interest 0 0 0  Down, Depressed, Hopeless 0 0 0  PHQ - 2 Score 0 0 0  Altered sleeping - - 0  Tired, decreased energy - - 1  Change in appetite - - 0  Feeling bad or failure about yourself  - - 0  Trouble concentrating - - 0  Moving slowly or fidgety/restless - - 0  Suicidal thoughts - - 0  PHQ-9 Score - - 1  Difficult doing work/chores - - Not difficult at all  Some recent data might be hidden    Patient has failed these meds in past: None noted  Patient is currently controlled on the following medications:  . Sertraline 25mg24mly  . Diazepam 5mg e45my 12 hours (only takes 1/2 tab HS)  We discussed:  Started sertraline when her husband was sick. This was a very stressful time for her. She feels fine now and denies being depressed  Plan -Continue current medications  Hot Flashes    Patient has failed  these meds in past: None noted  Patient is currently controlled on the following medications: . Estradiol 0.5mg 1/37mab daily . Medroxyprogesterone  2.37m 1/2 tab daily  She tried to get off of estradiol, but she would get hot flashes every 5 minutes, so she would like to stay on it.  She is aware of the risk, but rather improve her quality of life  We discussed:  That she ever wanted to stop these medications she would need to taper vs stopping completely  Plan -Continue current medications   Psoriatic Arthritis     Patient has failed these meds in past: None noted  Patient is currently controlled on the following medications: . Simponi 550mevery 2 months  Plan -Continue current medications   Pain    Patient has failed these meds in past: None noted  Patient is currently controlled on the following medications: . Diclofenac 5018mwice daily (only once in the morning)  Plan -Continue current medications   Vaccines   Reviewed and discussed patient's vaccination history.  Pt is up to date on vaccines. She is interested in a booster. Based on her taking Simponi believe she would qualify for a booster in the immunocompromised group.  Immunization History  Administered Date(s) Administered  . Influenza Split 06/29/2019  . Influenza Whole 08/15/2012  . Influenza, High Dose Seasonal PF 08/19/2015, 08/27/2017, 08/15/2018  . Influenza,inj,quad, With Preservative 08/15/2017, 08/15/2018  . Influenza-Unspecified 08/15/2014, 08/24/2016, 08/15/2018  . PFIZER SARS-COV-2 Vaccination 11/25/2019, 12/16/2019  . Pneumococcal Conjugate-13 12/11/2014  . Pneumococcal Polysaccharide-23 11/24/2011  . Pneumococcal-Unspecified 11/18/2014  . Td 02/25/2014  . Zoster 08/27/2011  . Zoster Recombinat (Shingrix) 01/20/2018, 04/07/2018    Miscellaneous Meds States she has loose stools, but not diarrhea. She states she has a few solid, but it is much different than her baseline. She feels  bloated. Gets abdominal cramps and then has to have a bowel movement shortly after eating. [place]  States she was supposed to have a tooth extraction and was placed on clindamycin. She wonders if this is the cause. She states she only took #2 1 hour before procedure. States she is a 4-5/10 regarding concern. She wonders if her melatonin with artificial sweetner has caused this   She did not start aspirin due to also taking diclofenac Did not start fish oil due to regular intake of fish  KanDe BlanchharmD Clinical Pharmacist [phone]

## 2020-07-18 ENCOUNTER — Other Ambulatory Visit: Payer: Self-pay | Admitting: *Deleted

## 2020-07-18 DIAGNOSIS — R197 Diarrhea, unspecified: Secondary | ICD-10-CM

## 2020-07-18 NOTE — Telephone Encounter (Signed)
Patient stated she took the clindamycin in June, and she is going to the bathroom about 5 times a day, and having some abdominal cramping.  No fever, no chills.  She came in office to pickup stool kit to do cultures.

## 2020-07-22 ENCOUNTER — Other Ambulatory Visit: Payer: Self-pay | Admitting: Family Medicine

## 2020-07-22 ENCOUNTER — Other Ambulatory Visit: Payer: Self-pay

## 2020-07-22 ENCOUNTER — Other Ambulatory Visit: Payer: Medicare Other

## 2020-07-22 DIAGNOSIS — R197 Diarrhea, unspecified: Secondary | ICD-10-CM

## 2020-07-22 DIAGNOSIS — I10 Essential (primary) hypertension: Secondary | ICD-10-CM

## 2020-07-22 MED ORDER — AMLODIPINE BESYLATE 2.5 MG PO TABS: 2.5000 mg | ORAL_TABLET | Freq: Every day | ORAL | 5 refills | Status: DC

## 2020-07-23 ENCOUNTER — Other Ambulatory Visit: Payer: Self-pay | Admitting: *Deleted

## 2020-07-23 DIAGNOSIS — I10 Essential (primary) hypertension: Secondary | ICD-10-CM

## 2020-07-23 MED ORDER — AMLODIPINE BESYLATE 2.5 MG PO TABS: 2.5000 mg | ORAL_TABLET | Freq: Two times a day (BID) | ORAL | 5 refills | Status: DC

## 2020-07-24 ENCOUNTER — Other Ambulatory Visit: Payer: Self-pay | Admitting: Family Medicine

## 2020-07-24 ENCOUNTER — Other Ambulatory Visit: Payer: Self-pay | Admitting: *Deleted

## 2020-07-24 DIAGNOSIS — R197 Diarrhea, unspecified: Secondary | ICD-10-CM

## 2020-07-24 DIAGNOSIS — I119 Hypertensive heart disease without heart failure: Secondary | ICD-10-CM

## 2020-07-24 DIAGNOSIS — E7800 Pure hypercholesterolemia, unspecified: Secondary | ICD-10-CM

## 2020-07-24 DIAGNOSIS — I10 Essential (primary) hypertension: Secondary | ICD-10-CM

## 2020-07-24 LAB — SALMONELLA/SHIGELLA CULT, CAMPY EIA AND SHIGA TOXIN RFL ECOLI
MICRO NUMBER: 10918096
MICRO NUMBER:: 10918097
MICRO NUMBER:: 10918098
Result:: NOT DETECTED
SHIGA RESULT:: NOT DETECTED
SPECIMEN QUALITY: ADEQUATE
SPECIMEN QUALITY:: ADEQUATE
SPECIMEN QUALITY:: ADEQUATE

## 2020-07-28 ENCOUNTER — Ambulatory Visit: Payer: Medicare Other | Admitting: Obstetrics & Gynecology

## 2020-07-28 LAB — CLOSTRIDIUM DIFFICILE TOXIN B, QUALITATIVE, REAL-TIME PCR: Toxigenic C. Difficile by PCR: NOT DETECTED

## 2020-07-28 LAB — FECAL LACTOFERRIN, QUANT
Fecal Lactoferrin: NEGATIVE
MICRO NUMBER:: 10917417
SPECIMEN QUALITY:: ADEQUATE

## 2020-08-04 ENCOUNTER — Encounter: Payer: Self-pay | Admitting: Family Medicine

## 2020-08-04 ENCOUNTER — Other Ambulatory Visit: Payer: Self-pay | Admitting: Family Medicine

## 2020-08-04 MED ORDER — DIAZEPAM 5 MG PO TABS
ORAL_TABLET | ORAL | 1 refills | Status: DC
Start: 2020-08-04 — End: 2021-03-23

## 2020-08-06 DIAGNOSIS — L4050 Arthropathic psoriasis, unspecified: Secondary | ICD-10-CM | POA: Diagnosis not present

## 2020-08-08 MED ORDER — HYDROCHLOROTHIAZIDE 25 MG PO TABS: ORAL_TABLET | ORAL | 1 refills | Status: DC

## 2020-08-14 NOTE — Patient Instructions (Signed)
Visit Information  Goals Addressed            This Visit's Progress   . Chronic Care Management Pharmacy Care Plan       CARE PLAN ENTRY (see longitudinal plan of care for additional care plan information)  Current Barriers:  . Chronic Disease Management support, education, and care coordination needs related to Hypertension, Hyperlipidemia, Parkinson's Disease, Depression/Anxiety, Hot flashes, Psoriatic Arthritis, Pain   Hypertension BP Readings from Last 3 Encounters:  06/19/20 (!) 147/84  06/02/20 (!) 158/82  02/14/20 132/79   . Pharmacist Clinical Goal(s): o Over the next 90 days, patient will work with PharmD and providers to achieve BP goal <140/90 . Current regimen:  . Amlodipine 2.5mg  twice daily . HCTZ 25mg  1.5 tabs daily . Interventions: o Discussed BP goal . Patient self care activities - Over the next 90 days, patient will: o Check BP daily, document, and provide at future appointments o Ensure daily salt intake < 2300 mg/Maye Parkinson  Hyperlipidemia Lab Results  Component Value Date/Time   LDLCALC 99 12/31/2019 08:52 AM   LDLDIRECT 120.0 12/11/2014 10:40 AM   . Pharmacist Clinical Goal(s): o Over the next 90 days, patient will work with PharmD and providers to maintain LDL goal < 100 . Current regimen:  o Diet and exercise management   . Interventions: o Discussed diet and exercise . Patient self care activities - Over the next 90 days, patient will: o Continue to monitor diet and exercise  Medication management . Pharmacist Clinical Goal(s): o Over the next 90 days, patient will work with PharmD and providers to maintain optimal medication adherence . [facility] . Interventions o Comprehensive medication review performed. o Continue current medication management strategy . Patient self care activities - Over the next 90 days, patient will: o Focus on medication adherence by filling and taking medications appropriately  o Take medications as  prescribed o Report any questions or concerns to PharmD and/or provider(s)  Initial goal documentation        Ms. Shonka was given information about Chronic Care Management services today including:  1. CCM service includes personalized support from designated clinical staff supervised by her physician, including individualized plan of care and coordination with other care providers 2. 24/7 contact phone numbers for assistance for urgent and routine care needs. 3. Standard insurance, coinsurance, copays and deductibles apply for chronic care management only during months in which we provide at least 20 minutes of these services. Most insurances cover these services at 100%, however patients may be responsible for any copay, coinsurance and/or deductible if applicable. This service may help you avoid the need for more expensive face-to-face services. 4. Only one practitioner may furnish and bill the service in a calendar month. 5. The patient may stop CCM services at any time (effective at the end of the month) by phone call to the office staff.  Patient agreed to services and verbal consent obtained.   The patient verbalized understanding of instructions provided today and agreed to receive a mailed copy of patient instruction and/or educational materials. Telephone follow up appointment with pharmacy team member scheduled for: 10/01/2020  Melvenia Beam Shelda Truby, PharmD Clinical Pharmacist [facility] [phone]   Cholesterol Content in Foods Cholesterol is a waxy, fat-like substance that helps to carry fat in the blood. The body needs cholesterol in small amounts, but too much cholesterol can cause damage to the arteries and heart. Most people should eat less than 200 milligrams (  mg) of cholesterol a Lativia Velie. Foods with cholesterol  Cholesterol is found in animal-based foods, such as meat, seafood, and dairy. Generally, low-fat dairy and lean meats have less  cholesterol than full-fat dairy and fatty meats. The milligrams of cholesterol per serving (mg per serving) of common cholesterol-containing foods are listed below. Meat and other proteins  Egg -- one large whole egg has 186 mg.  Veal shank -- 4 oz has 141 mg.  Lean ground [place] (93% lean) -- 4 oz has 118 mg.  Fat-trimmed lamb loin -- 4 oz has 106 mg.  Lean ground beef (90% lean) -- 4 oz has 100 mg.  Lobster -- 3.5 oz has 90 mg.  Pork loin chops -- 4 oz has 86 mg.  Canned salmon -- 3.5 oz has 83 mg.  Fat-trimmed beef top loin -- 4 oz has 78 mg.  Frankfurter -- 1 frank (3.5 oz) has 77 mg.  Crab -- 3.5 oz has 71 mg.  Roasted chicken without skin, white meat -- 4 oz has 66 mg.  Light bologna -- 2 oz has 45 mg.  Deli-cut [place] -- 2 oz has 31 mg.  Canned tuna -- 3.5 oz has 31 mg.  Berniece Salines -- 1 oz has 29 mg.  Oysters and mussels (raw) -- 3.5 oz has 25 mg.  Mackerel -- 1 oz has 22 mg.  Trout -- 1 oz has 20 mg.  Pork sausage -- 1 link (1 oz) has 17 mg.  Salmon -- 1 oz has 16 mg.  Tilapia -- 1 oz has 14 mg. Dairy  Soft-serve ice cream --  cup (4 oz) has 103 mg.  Whole-milk yogurt -- 1 cup (8 oz) has 29 mg.  Cheddar cheese -- 1 oz has 28 mg.  American cheese -- 1 oz has 28 mg.  Whole milk -- 1 cup (8 oz) has 23 mg.  2% milk -- 1 cup (8 oz) has 18 mg.  Cream cheese -- 1 tablespoon (Tbsp) has 15 mg.  Cottage cheese --  cup (4 oz) has 14 mg.  Low-fat (1%) milk -- 1 cup (8 oz) has 10 mg.  Sour cream -- 1 Tbsp has 8.5 mg.  Low-fat yogurt -- 1 cup (8 oz) has 8 mg.  Nonfat Greek yogurt -- 1 cup (8 oz) has 7 mg.  Half-and-half cream -- 1 Tbsp has 5 mg. Fats and oils  Cod liver oil -- 1 tablespoon (Tbsp) has 82 mg.  Butter -- 1 Tbsp has 15 mg.  Lard -- 1 Tbsp has 14 mg.  Bacon grease -- 1 Tbsp has 14 mg.  Mayonnaise -- 1 Tbsp has 5-10 mg.  Margarine -- 1 Tbsp has 3-10 mg. Exact amounts of cholesterol in these foods may vary depending on specific  ingredients and brands. Foods without cholesterol Most plant-based foods do not have cholesterol unless you combine them with a food that has cholesterol. Foods without cholesterol include:  Grains and cereals.  Vegetables.  Fruits.  Vegetable oils, such as olive, canola, and sunflower oil.  Legumes, such as peas, beans, and lentils.  Nuts and seeds.  Egg whites. Summary  The body needs cholesterol in small amounts, but too much cholesterol can cause damage to the arteries and heart.  Most people should eat less than 200 milligrams (mg) of cholesterol a Stephanie [name]. This information is not intended to replace advice given to you by your health care provider. Make sure you discuss any questions you have with your health care provider. Document Revised:  10/14/2017 Document Reviewed: 06/28/2017 Elsevier Patient Education  [place].

## 2020-08-19 DIAGNOSIS — Z23 Encounter for immunization: Secondary | ICD-10-CM | POA: Diagnosis not present

## 2020-08-21 DIAGNOSIS — Z96651 Presence of right artificial knee joint: Secondary | ICD-10-CM | POA: Diagnosis not present

## 2020-08-21 DIAGNOSIS — Z471 Aftercare following joint replacement surgery: Secondary | ICD-10-CM | POA: Diagnosis not present

## 2020-08-27 ENCOUNTER — Telehealth: Payer: Self-pay | Admitting: Pharmacist

## 2020-08-27 NOTE — Progress Notes (Addendum)
Chronic Care Management Pharmacy Assistant   Name: Stephanie [name]  MRN: [id number] DOB: 24-Jan-1932  Reason for Encounter: Disease State  Patient Questions:  1.  Have you seen any other providers since your last visit? No  2.  Any changes in your medicines or health? No  PCP : Mosie Lukes, MD   Their chronic conditions include: Hypertension, Hyperlipidemia, Parkinson's Disease, Depression/Anxiety, Hot flashes, Psoriatic Arthritis, Pain  No Office visits, Consults or Hospitalizations since last CCM visit on 07-10-20.  Allergies:   Allergies  Allergen Reactions  . Codeine Nausea And Vomiting  . Hornet Venom   . Penicillins Itching and Swelling    Has patient had a PCN reaction causing immediate rash, facial/tongue/throat swelling, SOB or lightheadedness with hypotension: Yes Has patient had a PCN reaction causing severe rash involving mucus membranes or skin necrosis: No Has patient had a PCN reaction that required hospitalization: No Has patient had a PCN reaction occurring within the last 10 years: No If all of the above answers are "NO", then may proceed with Cephalosporin use.   . [place]et Venom Swelling  . Lodine [Etodolac] Rash  . Tramadol Itching and Rash    Medications: Outpatient Encounter Medications as of 08/27/2020  Medication Sig Note  . amLODipine (NORVASC) 2.5 MG tablet Take 1 tablet (2.5 mg total) by mouth in the morning and at bedtime.   . benzonatate (TESSALON PERLES) 100 MG capsule Take 1 capsule (100 mg total) by mouth 3 (three) times daily as needed for cough.   . carbidopa-levodopa (SINEMET IR) 25-100 MG per tablet Take 1 tablet by mouth 4 (four) times daily.  07/19/2019: THROUGHOUT THE DAY   . Carbidopa-Levodopa ER (SINEMET CR) 25-100 MG tablet controlled release Take 1 tablet by mouth at bedtime. 07/19/2019: BEDTIME  . diazepam (VALIUM) 5 MG tablet TAKE 1 TABLET (5 MG TOTAL) BY MOUTH EVERY 12 (TWELVE) HOURS AS NEEDED. FOR ANXIETY   .  diclofenac (VOLTAREN) 50 MG EC tablet Take 50 mg by mouth 2 (two) times daily.   Marland Kitchen estradiol (ESTRACE) 0.5 MG tablet Take 1/2 tab po q day (Patient taking differently: Take 0.25 mg by mouth daily. Take 1/2 tab po q day)   . fluticasone (CUTIVATE) 0.05 % cream Apply 1 application topically daily as needed (scalp psoriasis).    Marland Kitchen golimumab (SIMPONI ARIA) 50 MG/4ML SOLN injection Inject 50 mg into the vein See admin instructions. Given every 2 months - Last given 06/28/2019   . hydrochlorothiazide (HYDRODIURIL) 25 MG tablet TAKE 1 AND 1/2 TABLETS DAILY BY MOUTH   . medroxyPROGESTERone (PROVERA) 2.5 MG tablet Take 0.5 tablets (1.25 mg total) by mouth daily.   . sertraline (ZOLOFT) 25 MG tablet TAKE 1 TABLET BY MOUTH EVERY DAY IN THE MORNING   . Vitamin D, Ergocalciferol, (DRISDOL) 1.25 MG (50000 UNIT) CAPS capsule TAKE 1 CAPSULE (50,000 UNITS TOTAL) BY MOUTH EVERY 7 (SEVEN) DAYS.    No facility-administered encounter medications on file as of 08/27/2020.    Current Diagnosis: Patient Active Problem List   Diagnosis Date Noted  . OA (osteoarthritis) of knee 07/30/2019  . Tinea corporis 06/08/2019  . Psoriatic arthritis ([place]) 04/02/2019  . Anemia 03/13/2018  . Hot flashes 03/13/2018  . S/P cervical spinal fusion 03/31/2017  . Neck pain 12/12/2016  . Depression with anxiety 10/17/2016  . Bladder cancer ([place]) 10/17/2016  . OA (osteoarthritis) of hip 07/07/2016  . Spinal stenosis of lumbar region with radiculopathy 04/08/2016  . Spondylolisthesis of  lumbar region 04/08/2016  . Spondylosis of cervical region without myelopathy or radiculopathy 04/08/2016  . Preventative health care 01/18/2016  . Low back pain 01/18/2016  . History of chicken pox   . Hyperlipidemia 12/15/2014  . Allergic rhinitis 06/25/2014  . Allergy to bee sting 06/10/2014  . TMJ tenderness 11/16/2013  . Other malaise and fatigue 05/07/2013  . Arthropathy of cervical spine ([place]) 11/27/2012  . Muscle spasms of neck  11/27/2012  . Parkinson's disease ([place]) 05/24/2012  . Conjunctivochalasis 03/09/2012  . Epiretinal membrane 03/09/2012  . Hyperopia with astigmatism and presbyopia 03/09/2012  . Pseudophakia 03/09/2012  . HTN (hypertension) 08/31/2011  . Right knee pain 08/31/2011  . Benign hypertensive heart disease without heart failure 05/12/2011    Goals Addressed   None    Reviewed chart prior to disease state call. Spoke with patient regarding BP  Recent Office Vitals: BP Readings from Last 3 Encounters:  06/19/20 (!) 147/84  06/02/20 (!) 158/82  02/14/20 132/79   Pulse Readings from Last 3 Encounters:  06/19/20 78  06/02/20 88  02/14/20 86    Wt Readings from Last 3 Encounters:  06/02/20 124 lb (56.2 kg)  02/14/20 126 lb 12.8 oz (57.5 kg)  12/27/19 126 lb 3.2 oz (57.2 kg)     Kidney Function Lab Results  Component Value Date/Time   CREATININE 0.8 05/30/2020 12:00 AM   CREATININE 0.83 02/14/2020 03:27 PM   CREATININE 0.57 07/31/2019 03:07 AM   CREATININE 0.68 07/25/2019 11:42 AM   GFR 72.60 03/13/2018 03:12 PM   GFRNONAA 67 05/30/2020 12:00 AM   GFRAA >60 07/31/2019 03:07 AM    BMP Latest Ref Rng & Units 05/30/2020 02/14/2020 07/31/2019  Glucose 65 - 99 mg/dL - 92 131(H)  BUN 4 - 21 25(A) 24 11  Creatinine 0.5 - 1.1 0.8 0.83 0.57  BUN/Creat Ratio 6 - 22 (calc) - NOT APPLICABLE -  Sodium 102 - 147 140 136 135  Potassium 3.4 - 5.3 3.9 4.2 3.8  Chloride 99 - 108 96(A) 94(L) 97(L)  CO2 13 - 22 18 31 28   Calcium 8.7 - 10.7 9.8 10.0 8.5(L)    . Current antihypertensive regimen:   Amlodipine 2.5mg  twice daily  HCTZ 25mg  1.5 tabs daily . How often are you checking your Blood Pressure? daily . Current home BP readings: 125/71 on 08-27-20 . What recent interventions/DTPs have been made by any provider to improve Blood Pressure control since last CPP Visit: None . Any recent hospitalizations or ED visits since last visit with CPP? No . What diet changes have been made to  improve Blood Pressure Control?  o States no diet changes.  She eats fish twice a week and no fried foods or sweets. States she eats a lot of fruit . What exercise is being done to improve your Blood Pressure Control?  o States she walks a mile three to four times a week.  States she does do knee exercises  Adherence Review:  Patient states she stopped all her vitamins to see if that helps out her bowel problems which started 08/27/20. Is the patient currently on ACE/ARB medication? No Does the patient have >5 day gap between last estimated fill dates? No    Follow-Up:  Pharmacist Review   [place], [facility] [place] Pharmacist Assistant [phone]  Reviewed by: De Blanch, PharmD Clinical Pharmacist [facility] [phone]

## 2020-09-02 ENCOUNTER — Encounter: Payer: Self-pay | Admitting: Family Medicine

## 2020-09-02 ENCOUNTER — Other Ambulatory Visit: Payer: Self-pay

## 2020-09-02 ENCOUNTER — Ambulatory Visit (INDEPENDENT_AMBULATORY_CARE_PROVIDER_SITE_OTHER): Payer: Medicare Other | Admitting: Family Medicine

## 2020-09-02 VITALS — BP 116/68 | HR 88 | Temp 97.9°F | Resp 16 | Wt 119.6 lb

## 2020-09-02 DIAGNOSIS — E7800 Pure hypercholesterolemia, unspecified: Secondary | ICD-10-CM | POA: Diagnosis not present

## 2020-09-02 DIAGNOSIS — R197 Diarrhea, unspecified: Secondary | ICD-10-CM

## 2020-09-02 DIAGNOSIS — E559 Vitamin D deficiency, unspecified: Secondary | ICD-10-CM

## 2020-09-02 DIAGNOSIS — L4050 Arthropathic psoriasis, unspecified: Secondary | ICD-10-CM | POA: Diagnosis not present

## 2020-09-02 DIAGNOSIS — I10 Essential (primary) hypertension: Secondary | ICD-10-CM | POA: Diagnosis not present

## 2020-09-02 DIAGNOSIS — G20 Parkinson's disease: Secondary | ICD-10-CM | POA: Diagnosis not present

## 2020-09-02 MED ORDER — CHOLESTYRAMINE 4 G PO PACK: 4.0000 g | PACK | Freq: Two times a day (BID) | ORAL | 5 refills | Status: DC

## 2020-09-02 NOTE — Patient Instructions (Signed)
Diarrhea, Adult °Diarrhea is frequent loose and watery bowel movements. Diarrhea can make you feel weak and cause you to become dehydrated. Dehydration can make you tired and thirsty, cause you to have a dry mouth, and decrease how often you urinate. °Diarrhea typically lasts 2-3 days. However, it can last longer if it is a sign of something more serious. It is important to treat your diarrhea as told by your health care provider. °Follow these instructions at home: °Eating and drinking ° °  ° °Follow these recommendations as told by your health care provider: °· Take an oral rehydration solution (ORS). This is an over-the-counter medicine that helps return your body to its normal balance of nutrients and water. It is found at pharmacies and retail stores. °· Drink plenty of fluids, such as water, ice chips, diluted fruit juice, and low-calorie sports drinks. You can drink milk also, if desired. °· Avoid drinking fluids that contain a lot of sugar or caffeine, such as energy drinks, sports drinks, and soda. °· Eat bland, easy-to-digest foods in small amounts as you are able. These foods include bananas, applesauce, rice, lean meats, toast, and crackers. °· Avoid alcohol. °· Avoid spicy or fatty foods. ° °Medicines °· Take over-the-counter and prescription medicines only as told by your health care provider. °· If you were prescribed an antibiotic medicine, take it as told by your health care provider. Do not stop using the antibiotic even if you start to feel better. °General instructions ° °· Wash your hands often using soap and water. If soap and water are not available, use a hand sanitizer. Others in the household should wash their hands as well. Hands should be washed: °? After using the toilet or changing a diaper. °? Before preparing, cooking, or serving food. °? While caring for a sick person or while visiting someone in a hospital. °· Drink enough fluid to keep your urine pale yellow. °· Rest at home while  you recover. °· Watch your condition for any changes. °· Take a warm bath to relieve any burning or pain from frequent diarrhea episodes. °· Keep all follow-up visits as told by your health care provider. This is important. °Contact a health care provider if: °· You have a fever. °· Your diarrhea gets worse. °· You have new symptoms. °· You cannot keep fluids down. °· You feel light-headed or dizzy. °· You have a headache. °· You have muscle cramps. °Get help right away if: °· You have chest pain. °· You feel extremely weak or you faint. °· You have bloody or black stools or stools that look like tar. °· You have severe pain, cramping, or bloating in your abdomen. °· You have trouble breathing or you are breathing very quickly. °· Your heart is beating very quickly. °· Your skin feels cold and clammy. °· You feel confused. °· You have signs of dehydration, such as: °? Dark urine, very little urine, or no urine. °? Cracked lips. °? Dry mouth. °? Sunken eyes. °? Sleepiness. °? Weakness. °Summary °· Diarrhea is frequent loose and watery bowel movements. Diarrhea can make you feel weak and cause you to become dehydrated. °· Drink enough fluids to keep your urine pale yellow. °· Make sure that you wash your hands after using the toilet. If soap and water are not available, use hand sanitizer. °· Contact a health care provider if your diarrhea gets worse or you have new symptoms. °· Get help right away if you have signs of dehydration. °This   information is not intended to replace advice given to you by your health care provider. Make sure you discuss any questions you have with your health care provider. °Document Revised: 03/20/2019 Document Reviewed: 04/07/2018 °Elsevier Patient Education © 2020 [employer] ° °

## 2020-09-03 DIAGNOSIS — E559 Vitamin D deficiency, unspecified: Secondary | ICD-10-CM | POA: Insufficient documentation

## 2020-09-03 DIAGNOSIS — R197 Diarrhea, unspecified: Secondary | ICD-10-CM | POA: Insufficient documentation

## 2020-09-03 LAB — CBC WITH DIFFERENTIAL/PLATELET
Absolute Monocytes: 413 cells/uL (ref 200–950)
Basophils Absolute: 22 cells/uL (ref 0–200)
Basophils Relative: 0.4 %
Eosinophils Absolute: 110 cells/uL (ref 15–500)
Eosinophils Relative: 2 %
HCT: 41 % (ref 35.0–45.0)
Hemoglobin: 13.9 g/dL (ref 11.7–15.5)
Lymphs Abs: 1282 cells/uL (ref 850–3900)
MCH: 31.5 pg (ref 27.0–33.0)
MCHC: 33.9 g/dL (ref 32.0–36.0)
MCV: 93 fL (ref 80.0–100.0)
MPV: 10.6 fL (ref 7.5–12.5)
Monocytes Relative: 7.5 %
Neutro Abs: 3674 cells/uL (ref 1500–7800)
Neutrophils Relative %: 66.8 %
Platelets: 182 10*3/uL (ref 140–400)
RBC: 4.41 10*6/uL (ref 3.80–5.10)
RDW: 12.4 % (ref 11.0–15.0)
Total Lymphocyte: 23.3 %
WBC: 5.5 10*3/uL (ref 3.8–10.8)

## 2020-09-03 LAB — LIPID PANEL
Cholesterol: 188 mg/dL (ref <200)
HDL: 51 mg/dL (ref >=50)
LDL Cholesterol (Calc): 113 mg/dL (calc) — ABNORMAL HIGH
Non-HDL Cholesterol (Calc): 137 mg/dL (calc) — ABNORMAL HIGH (ref <130)
Total CHOL/HDL Ratio: 3.7 (calc) (ref <5.0)
Triglycerides: 129 mg/dL (ref <150)

## 2020-09-03 LAB — COMPREHENSIVE METABOLIC PANEL
AG Ratio: 2.6 (calc) — ABNORMAL HIGH (ref 1.0–2.5)
ALT: 6 U/L (ref 6–29)
AST: 21 U/L (ref 10–35)
Albumin: 4.7 g/dL (ref 3.6–5.1)
Alkaline phosphatase (APISO): 40 U/L (ref 37–153)
BUN/Creatinine Ratio: 34 (calc) — ABNORMAL HIGH (ref 6–22)
BUN: 27 mg/dL — ABNORMAL HIGH (ref 7–25)
CO2: 31 mmol/L (ref 20–32)
Calcium: 9.7 mg/dL (ref 8.6–10.4)
Chloride: 97 mmol/L — ABNORMAL LOW (ref 98–110)
Creat: 0.79 mg/dL (ref 0.60–0.88)
Globulin: 1.8 g/dL (calc) — ABNORMAL LOW (ref 1.9–3.7)
Glucose, Bld: 85 mg/dL (ref 65–99)
Potassium: 4 mmol/L (ref 3.5–5.3)
Sodium: 136 mmol/L (ref 135–146)
Total Bilirubin: 0.6 mg/dL (ref 0.2–1.2)
Total Protein: 6.5 g/dL (ref 6.1–8.1)

## 2020-09-03 LAB — VITAMIN D 25 HYDROXY (VIT D DEFICIENCY, FRACTURES): Vit D, 25-Hydroxy: 94 ng/mL (ref 30–100)

## 2020-09-03 LAB — TSH: TSH: 1.43 mIU/L (ref 0.40–4.50)

## 2020-09-03 LAB — LIPASE: Lipase: 42 U/L (ref 7–60)

## 2020-09-03 LAB — AMYLASE: Amylase: 56 U/L (ref 21–101)

## 2020-09-03 LAB — MAGNESIUM: Magnesium: 2.2 mg/dL (ref 1.5–2.5)

## 2020-09-03 NOTE — Assessment & Plan Note (Signed)
Well controlled, no changes to meds. Encouraged heart healthy diet such as the DASH diet and exercise as tolerated.  °

## 2020-09-03 NOTE — Progress Notes (Signed)
Subjective:    Patient ID: [name], female    DOB: 1932/03/26, 86 y.o.   MRN: [id number]  Chief Complaint  Patient presents with  . Follow-up    Pt states that she been having gastro. issues.  . Hypertension    HPI Patient is in today for follow up on chronic medical concerns. No recent febrile illness or hospitalizations. She is noting ongoing joint pain although she has gotten some relief from Simponi. She is noting the return of diarrhea after a short reprieve. She also notes abdominal gas and cramping. No bloody or tarry stool. Denies CP/palp/SOB/HA/congestion/fevers or GU c/o. Taking meds as prescribed  Past Medical History:  Diagnosis Date  . Arthropathy of cervical spine 11/27/2012   Right  Xray at [facility]  On 04/08/2016  AP, lateral, and lateral flexion and extension views of the cervical spine are submitted for evaluation.   No acute fracture identified in the cervical spine.   There is redemonstration of approximately 2 mm of C3 on C4 anterolisthesis in neutral which slightly increases in flexion relative to neutral and extension. Slight C5 on C6 retrolisthesis does not change i  . Benign hypertension without congestive heart failure   . Benign mole    right hcets mole, bleeds when dried with towel  . Bilateral dry eyes   . Bladder cancer ([place]) 11/13/2016  . Cervical spondylosis   . Cervicalgia    2 screws in place   . Depression with anxiety Nov 13, 2016   prior to husbands death   . Gross hematuria    developed after first hip replacment, led to indicental finding of bladder tumor, bleeding now resolved   . History of kidney stones    1970's  . Left hand weakness    due to reinjury of flxor tendon s/p tendon surgery march 12-2018  . Lumbar stenosis   . Malignant tumor of urinary bladder ([place]) 07/2016   Pre-stage I  . OA (osteoarthritis)   . Parkinson disease [facility]) dx 2012   neurologist-  dr siddiqui at [facility]  . Psoriatic arthritis [facility])     Dr Trudie Reed , [place]  rheum   . Rupture of flexor tendon of hand 2020   right    Past Surgical History:  Procedure Laterality Date  . BIOPSY MASS LEFT FIRST METACARPAL   06/23/2006   benign  . CATARACT EXTRACTION W/ INTRAOCULAR LENS  IMPLANT, BILATERAL  1999 and 2003  . CERVICAL FUSION  02/2017   c1-c2 ; reports she has 2 screws 2in long in place done at [facility] ; patient exhibits VERY LIMITED NECK ROM  . CYSTOSCOPY W/ RETROGRADES Bilateral 09/22/2016   Procedure: CYSTOSCOPY WITH RETROGRADE PYELOGRAM;  Surgeon: Alexis Frock, MD;  Location: [facility];  Service: Urology;  Laterality: Bilateral;  . D & C HYSTEROSCOPY W/ RESECTION POLYP  07/11/2000  . DILATION AND CURETTAGE OF UTERUS  1960's  . EXCISION CYST AND DEBRIDEMENT RIGHT WIRST AND REMOVAL [place] BODY  01/09/2009  . FLEXOR TENDON REPAIR Left 12/12/2018   Procedure: REPAIR/TRANSFER FLEXOR DIGITORUM PROFUNDUS OF LEFT SMALL FINGER;  Surgeon: Daryll Brod, MD;  Location: [place];  Service: Orthopedics;  Laterality: Left;  . LAPAROSCOPY  yrs ago   infertility   . METACARPOPHALANGEAL JOINT ARTHRODESIS Right 05/25/1996  . TENDON REPAIR Left 01/15/2019   Hand  . TONSILLECTOMY AND ADENOIDECTOMY  child  . TOTAL HIP ARTHROPLASTY Left 07/07/2016   Procedure: LEFT TOTAL HIP ARTHROPLASTY ANTERIOR APPROACH;  Surgeon: Gaynelle Arabian, MD;  Location: [facility];  Service: Orthopedics;  Laterality: Left;  . TOTAL HIP ARTHROPLASTY Right 09/28/2017   Procedure: RIGHT TOTAL HIP ARTHROPLASTY ANTERIOR APPROACH;  Surgeon: Gaynelle Arabian, MD;  Location: [facility];  Service: Orthopedics;  Laterality: Right;  . TOTAL KNEE ARTHROPLASTY Right 07/30/2019   Procedure: TOTAL KNEE ARTHROPLASTY;  Surgeon: Gaynelle Arabian, MD;  Location: [facility];  Service: Orthopedics;  Laterality: Right;  61min  . TRANSURETHRAL RESECTION OF BLADDER TUMOR N/A 09/22/2016   Procedure: TRANSURETHRAL RESECTION OF BLADDER TUMOR (TURBT);  Surgeon: Alexis Frock, MD;  Location:  [facility];  Service: Urology;  Laterality: N/A;    Family History  Problem Relation Age of Onset  . Arthritis Mother   . Hypertension Mother   . Deep vein thrombosis Mother        recurrent, secondary to Bleeding disorder  . Arthritis Father   . Stroke Father   . Diabetes Sister   . Heart disease Sister   . Obesity Sister   . Arthritis Sister   . Hypertension Sister   . Heart disease Maternal Grandmother   . Heart disease Maternal Grandfather   . Peripheral vascular disease Paternal Grandmother        s/p leg amputation  . Stroke Paternal Grandfather     Social History   Socioeconomic History  . Marital status: Widowed    Spouse name: Not on file  . Number of children: Not on file  . Years of education: Not on file  . Highest education level: Not on file  Occupational History  . Not on file  Tobacco Use  . Smoking status: Former Smoker    Years: 10.00    Quit date: 05/04/1964    Years since quitting: 56.3  . Smokeless tobacco: Never Used  Vaping Use  . Vaping Use: Never used  Substance and Sexual Activity  . Alcohol use: Yes    Alcohol/week: 7.0 standard drinks    Types: 7 Glasses of wine per week  . Drug use: No  . Sexual activity: Not Currently    Partners: Male  Other Topics Concern  . Not on file  Social History Narrative  . Not on file   Social Determinants of Health   Financial Resource Strain: Low Risk   . Difficulty of Paying Living Expenses: Not hard at all  Food Insecurity: No Food Insecurity  . Worried About Charity fundraiser in the Last Year: Never true  . Ran Out of Food in the Last Year: Never true  Transportation Needs: No Transportation Needs  . Lack of Transportation (Medical): No  . Lack of Transportation (Non-Medical): No  Physical Activity:   . Days of Exercise per Week: Not on file  . Minutes of Exercise per Session: Not on file  Stress:   . Feeling of Stress : Not on file  Social Connections: Moderately  Integrated  . Frequency of Communication with Friends and Family: Three times a week  . Frequency of Social Gatherings with Friends and Family: Once a week  . Attends Religious Services: More than 4 times per year  . Active Member of Clubs or Organizations: Yes  . Attends Archivist Meetings: Not on file  . Marital Status: Widowed  Intimate Partner Violence:   . Fear of Current or Ex-Partner: Not on file  . Emotionally Abused: Not on file  . Physically Abused: Not on file  . Sexually Abused: Not on file    Outpatient  Medications Prior to Visit  Medication Sig Dispense Refill  . amLODipine (NORVASC) 2.5 MG tablet Take 1 tablet (2.5 mg total) by mouth in the morning and at bedtime. 60 tablet 5  . carbidopa-levodopa (SINEMET IR) 25-100 MG per tablet Take 1 tablet by mouth 4 (four) times daily.     . Carbidopa-Levodopa ER (SINEMET CR) 25-100 MG tablet controlled release Take 1 tablet by mouth at bedtime.    . diazepam (VALIUM) 5 MG tablet TAKE 1 TABLET (5 MG TOTAL) BY MOUTH EVERY 12 (TWELVE) HOURS AS NEEDED. FOR ANXIETY 60 tablet 1  . diclofenac (VOLTAREN) 50 MG EC tablet Take 50 mg by mouth daily.     Marland Kitchen estradiol (ESTRACE) 0.5 MG tablet Take 1/2 tab po q day (Patient taking differently: Take 0.25 mg by mouth daily. Take 1/2 tab po q day) 45 tablet 4  . fluticasone (CUTIVATE) 0.05 % cream Apply 1 application topically daily as needed (scalp psoriasis).   1  . golimumab (SIMPONI ARIA) 50 MG/4ML SOLN injection Inject 50 mg into the vein See admin instructions. Given every 2 months - Last given 06/28/2019    . hydrochlorothiazide (HYDRODIURIL) 25 MG tablet TAKE 1 AND 1/2 TABLETS DAILY BY MOUTH 135 tablet 1  . medroxyPROGESTERone (PROVERA) 2.5 MG tablet Take 0.5 tablets (1.25 mg total) by mouth daily. 45 tablet 4  . sertraline (ZOLOFT) 25 MG tablet TAKE 1 TABLET BY MOUTH EVERY DAY IN THE MORNING 90 tablet 1  . Vitamin D, Ergocalciferol, (DRISDOL) 1.25 MG (50000 UNIT) CAPS capsule TAKE 1  CAPSULE (50,000 UNITS TOTAL) BY MOUTH EVERY 7 (SEVEN) DAYS. 12 capsule 1  . benzonatate (TESSALON PERLES) 100 MG capsule Take 1 capsule (100 mg total) by mouth 3 (three) times daily as needed for cough. 30 capsule 1   No facility-administered medications prior to visit.    Allergies  Allergen Reactions  . Codeine Nausea And Vomiting  . Hornet Venom   . Penicillins Itching and Swelling    Has patient had a PCN reaction causing immediate rash, facial/tongue/throat swelling, SOB or lightheadedness with hypotension: Yes Has patient had a PCN reaction causing severe rash involving mucus membranes or skin necrosis: No Has patient had a PCN reaction that required hospitalization: No Has patient had a PCN reaction occurring within the last 10 years: No If all of the above answers are "NO", then may proceed with Cephalosporin use.   . [place]et Venom Swelling  . Lodine [Etodolac] Rash  . Tramadol Itching and Rash    Review of Systems  Constitutional: Positive for malaise/fatigue. Negative for fever.  HENT: Negative for congestion.   Eyes: Negative for blurred vision.  Respiratory: Negative for shortness of breath.   Cardiovascular: Negative for chest pain, palpitations and leg swelling.  Gastrointestinal: Positive for abdominal pain and diarrhea. Negative for blood in stool, melena and nausea.  Genitourinary: Negative for dysuria and frequency.  Musculoskeletal: Positive for joint pain. Negative for falls.  Skin: Negative for rash.  Neurological: Positive for tremors. Negative for dizziness, loss of consciousness and headaches.  Endo/Heme/Allergies: Negative for environmental allergies.  Psychiatric/Behavioral: Negative for depression. The patient is not nervous/anxious.        Objective:    Physical Exam Vitals and nursing note reviewed.  Constitutional:      General: She is not in acute distress.    Appearance: She is well-developed.  HENT:     Head: Normocephalic and  atraumatic.     Nose: Nose normal.  Eyes:  General:        Right eye: No discharge.        Left eye: No discharge.  Cardiovascular:     Rate and Rhythm: Normal rate and regular rhythm.     Heart sounds: No murmur heard.   Pulmonary:     Effort: Pulmonary effort is normal.     Breath sounds: Normal breath sounds.  Abdominal:     General: Bowel sounds are normal.     Palpations: Abdomen is soft.     Tenderness: There is no abdominal tenderness.  Musculoskeletal:     Cervical back: Normal range of motion and neck supple.  Skin:    General: Skin is warm and dry.  Neurological:     Mental Status: She is alert and oriented to person, place, and time.     BP 116/68 (BP Location: Left Arm)   Pulse 88   Temp 97.9 F (36.6 C) (Oral)   Resp 16   Wt 119 lb 9.6 oz (54.3 kg)   LMP 01/13/2013 Comment: spotting-had benign endometrial biopsy   SpO2 99%   BMI 21.19 kg/m  Wt Readings from Last 3 Encounters:  09/02/20 119 lb 9.6 oz (54.3 kg)  06/02/20 124 lb (56.2 kg)  02/14/20 126 lb 12.8 oz (57.5 kg)    Diabetic Foot Exam - Simple   No data filed     Lab Results  Component Value Date   WBC 5.5 09/02/2020   HGB 13.9 09/02/2020   HCT 41.0 09/02/2020   PLT 182 09/02/2020   GLUCOSE 85 09/02/2020   CHOL 188 09/02/2020   TRIG 129 09/02/2020   HDL 51 09/02/2020   LDLDIRECT 120.0 12/11/2014   LDLCALC 113 (H) 09/02/2020   ALT 6 09/02/2020   AST 21 09/02/2020   NA 136 09/02/2020   K 4.0 09/02/2020   CL 97 (L) 09/02/2020   CREATININE 0.79 09/02/2020   BUN 27 (H) 09/02/2020   CO2 31 09/02/2020   TSH 1.43 09/02/2020   INR 1.0 07/25/2019   HGBA1C 5.1 02/14/2020    Lab Results  Component Value Date   TSH 1.43 09/02/2020   Lab Results  Component Value Date   WBC 5.5 09/02/2020   HGB 13.9 09/02/2020   HCT 41.0 09/02/2020   MCV 93.0 09/02/2020   PLT 182 09/02/2020   Lab Results  Component Value Date   NA 136 09/02/2020   K 4.0 09/02/2020   CO2 31 09/02/2020    GLUCOSE 85 09/02/2020   BUN 27 (H) 09/02/2020   CREATININE 0.79 09/02/2020   BILITOT 0.6 09/02/2020   ALKPHOS 45 05/30/2020   AST 21 09/02/2020   ALT 6 09/02/2020   PROT 6.5 09/02/2020   ALBUMIN 5.2 (H) 07/25/2019   CALCIUM 9.7 09/02/2020   ANIONGAP 10 07/31/2019   GFR 72.60 03/13/2018   Lab Results  Component Value Date   CHOL 188 09/02/2020   Lab Results  Component Value Date   HDL 51 09/02/2020   Lab Results  Component Value Date   LDLCALC 113 (H) 09/02/2020   Lab Results  Component Value Date   TRIG 129 09/02/2020   Lab Results  Component Value Date   CHOLHDL 3.7 09/02/2020   Lab Results  Component Value Date   HGBA1C 5.1 02/14/2020       Assessment & Plan:   Problem List Items Addressed This Visit    HTN (hypertension)    Well controlled, no changes to meds. Encouraged heart healthy diet  such as the DASH diet and exercise as tolerated.       Relevant Medications   cholestyramine (QUESTRAN) 4 g packet   Other Relevant Orders   CBC with Differential/Platelet (Completed)   TSH (Completed)   Parkinson's disease ([place])   Hyperlipidemia    Encouraged heart healthy diet, increase exercise, avoid trans fats, consider a krill oil cap daily      Relevant Medications   cholestyramine (QUESTRAN) 4 g packet   Other Relevant Orders   Lipid panel (Completed)   Psoriatic arthritis ([place])    Tolerating Simponi working with rheumatology      Diarrhea - Primary    Work up in past with cultures was negative, she was referred to gastroenterology but then her diarrhea had stopped so she declined the referral now the diarrhea has returned. She is started on Cholestyramine once to twice daily and re referred to gastroenterology      Relevant Orders   Comprehensive metabolic panel (Completed)   Magnesium (Completed)   Ambulatory referral to Gastroenterology   Lipase (Completed)   Amylase (Completed)   Vitamin D deficiency    Supplement and monitor      Relevant  Orders   VITAMIN D 25 Hydroxy (Vit-D Deficiency, Fractures) (Completed)      I have discontinued Armanda Magic. Kage's benzonatate. I am also having her start on cholestyramine. Additionally, I am having her maintain her carbidopa-levodopa, fluticasone, Simponi Aria, estradiol, medroxyPROGESTERone, Carbidopa-Levodopa ER, diclofenac, sertraline, Vitamin D (Ergocalciferol), amLODipine, diazepam, and hydrochlorothiazide.  Meds ordered this encounter  Medications  . cholestyramine (QUESTRAN) 4 g packet    Sig: Take 1 packet (4 g total) by mouth 2 (two) times daily.    Dispense:  60 each    Refill:  5     Penni Homans, MD

## 2020-09-03 NOTE — Assessment & Plan Note (Signed)
Supplement and monitor 

## 2020-09-03 NOTE — Assessment & Plan Note (Signed)
Work up in past with cultures was negative, she was referred to gastroenterology but then her diarrhea had stopped so she declined the referral now the diarrhea has returned. She is started on Cholestyramine once to twice daily and re referred to gastroenterology

## 2020-09-03 NOTE — Assessment & Plan Note (Signed)
Tolerating Simponi working with rheumatology

## 2020-09-03 NOTE — Assessment & Plan Note (Signed)
Encouraged heart healthy diet, increase exercise, avoid trans fats, consider a krill oil cap daily 

## 2020-09-12 ENCOUNTER — Ambulatory Visit (INDEPENDENT_AMBULATORY_CARE_PROVIDER_SITE_OTHER): Payer: Medicare Other | Admitting: Gastroenterology

## 2020-09-12 ENCOUNTER — Encounter: Payer: Self-pay | Admitting: Gastroenterology

## 2020-09-12 VITALS — BP 126/70 | HR 83 | Ht 62.0 in | Wt 124.1 lb

## 2020-09-12 DIAGNOSIS — K580 Irritable bowel syndrome with diarrhea: Secondary | ICD-10-CM | POA: Diagnosis not present

## 2020-09-12 NOTE — Progress Notes (Signed)
Chief Complaint:   Referring Provider:  Mosie Lukes, MD      ASSESSMENT AND PLAN;   #1. IBS-D. No need for colon unless any red flag symptoms.  Negative colonoscopy 01/2003.  #2.  Comorbid conditions include advanced age, Parkinson's, psoriasis, anxiety/depression, OA, HTN, H/O bladder cancer.  Plan: -Continue Cholestyamine 4g po qd. she has done very well with that.  If she starts having constipation, cut powder in half and then use it on as needed basis. -No need for colon d/t advanced age and absence of red flag symptoms.  She agrees fully.   HPI:    Stephanie [name] is a 86 y.o. female  C/O diarrhea ever since mid August 2021 while she was visiting [place] after unfortunate incident of her brother-in-law passing away.  Diarrhea continued especially after eating.  Was associated with minimal abdominal bloating.  No abdominal pain.  No melena or hematochezia.  No nocturnal symptoms.  Her stool studies were negative for C. difficile, GI pathogens.  Her labs were normal including CBC, CMP.  She was started on cholestyramine 4 g p.o. once a day with complete resolution of diarrhea.  She feels 100% better.  At the present time denies having any problems.  She would like to hold off on colonoscopy.  I do agree.  Of note that she took doxycycline after tooth extraction in June.  No nausea, rare heartburn if she eats fried foods, no odynophagia or dysphagia.  No weight loss or loss of appetite.  She is very pleased with cholestyramine.  Instructed her to take 2 hours before or after rest of the medications.   Past Medical History:  Diagnosis Date  . Arthropathy of cervical spine 11/27/2012   Right  Xray at [facility]  On 04/08/2016  AP, lateral, and lateral flexion and extension views of the cervical spine are submitted for evaluation.   No acute fracture identified in the cervical spine.   There is redemonstration of approximately 2 mm of C3 on C4 anterolisthesis in neutral which  slightly increases in flexion relative to neutral and extension. Slight C5 on C6 retrolisthesis does not change i  . Benign hypertension without congestive heart failure   . Benign mole    right hcets mole, bleeds when dried with towel  . Bilateral dry eyes   . Bladder cancer ([place]) November 04, 2016  . Cervical spondylosis   . Cervicalgia    2 screws in place   . Depression with anxiety 2016-11-04   prior to husbands death   . Gross hematuria    developed after first hip replacment, led to indicental finding of bladder tumor, bleeding now resolved   . History of kidney stones    1970's  . Left hand weakness    due to reinjury of flxor tendon s/p tendon surgery march 12-2018  . Lumbar stenosis   . Malignant tumor of urinary bladder ([place]) 07/2016   Pre-stage I  . OA (osteoarthritis)   . Parkinson disease [facility]) dx 2012   neurologist-  dr siddiqui at [id number] [facility]  . Psoriatic arthritis [facility])     Dr Trudie Reed , [place] [facility]   . Rupture of flexor tendon of hand 2020   right    Past Surgical History:  Procedure Laterality Date  . BIOPSY MASS LEFT FIRST METACARPAL   06/23/2006   benign  . CATARACT EXTRACTION W/ INTRAOCULAR LENS  IMPLANT, BILATERAL  1999 and 2003  . CERVICAL FUSION  02/2017   c1-c2 ;  reports she has 2 screws 2in long in place done at [facility] ; patient exhibits VERY LIMITED NECK ROM  . CYSTOSCOPY W/ RETROGRADES Bilateral 09/22/2016   Procedure: CYSTOSCOPY WITH RETROGRADE PYELOGRAM;  Surgeon: Alexis Frock, MD;  Location: [facility];  Service: Urology;  Laterality: Bilateral;  . D & C HYSTEROSCOPY W/ RESECTION POLYP  07/11/2000  . DILATION AND CURETTAGE OF UTERUS  1960's  . EXCISION CYST AND DEBRIDEMENT RIGHT WIRST AND REMOVAL [place] BODY  01/09/2009  . FLEXOR TENDON REPAIR Left 12/12/2018   Procedure: REPAIR/TRANSFER FLEXOR DIGITORUM PROFUNDUS OF LEFT SMALL FINGER;  Surgeon: Daryll Brod, MD;  Location: [facility];  Service: Orthopedics;   Laterality: Left;  . LAPAROSCOPY  yrs ago   infertility   . METACARPOPHALANGEAL JOINT ARTHRODESIS Right 05/25/1996  . TENDON REPAIR Left 01/15/2019   Hand  . TONSILLECTOMY AND ADENOIDECTOMY  child  . TOTAL HIP ARTHROPLASTY Left 07/07/2016   Procedure: LEFT TOTAL HIP ARTHROPLASTY ANTERIOR APPROACH;  Surgeon: Gaynelle Arabian, MD;  Location: [facility];  Service: Orthopedics;  Laterality: Left;  . TOTAL HIP ARTHROPLASTY Right 09/28/2017   Procedure: RIGHT TOTAL HIP ARTHROPLASTY ANTERIOR APPROACH;  Surgeon: Gaynelle Arabian, MD;  Location: [facility];  Service: Orthopedics;  Laterality: Right;  . TOTAL KNEE ARTHROPLASTY Right 07/30/2019   Procedure: TOTAL KNEE ARTHROPLASTY;  Surgeon: Gaynelle Arabian, MD;  Location: [facility];  Service: Orthopedics;  Laterality: Right;  84min  . TRANSURETHRAL RESECTION OF BLADDER TUMOR N/A 09/22/2016   Procedure: TRANSURETHRAL RESECTION OF BLADDER TUMOR (TURBT);  Surgeon: Alexis Frock, MD;  Location: [facility];  Service: Urology;  Laterality: N/A;    Family History  Problem Relation Age of Onset  . Arthritis Mother   . Hypertension Mother   . Deep vein thrombosis Mother        recurrent, secondary to Bleeding disorder  . Arthritis Father   . Stroke Father   . Diabetes Sister   . Heart disease Sister   . Obesity Sister   . Arthritis Sister   . Hypertension Sister   . Heart disease Maternal Grandmother   . Heart disease Maternal Grandfather   . Peripheral vascular disease Paternal Grandmother        s/p leg amputation  . Stroke Paternal Grandfather   . Colon cancer Neg Hx   . Esophageal cancer Neg Hx     Social History   Tobacco Use  . Smoking status: Former Smoker    Years: 10.00    Quit date: 05/04/1964    Years since quitting: 56.3  . Smokeless tobacco: Never Used  Vaping Use  . Vaping Use: Never used  Substance Use Topics  . Alcohol use: Yes    Alcohol/week: 7.0 standard drinks    Types: 7 Glasses of wine per week  . Drug use: No     Current Outpatient Medications  Medication Sig Dispense Refill  . amLODipine (NORVASC) 2.5 MG tablet Take 1 tablet (2.5 mg total) by mouth in the morning and at bedtime. 60 tablet 5  . carbidopa-levodopa (SINEMET IR) 25-100 MG per tablet Take 1 tablet by mouth 4 (four) times daily.     . Carbidopa-Levodopa ER (SINEMET CR) 25-100 MG tablet controlled release Take 1 tablet by mouth at bedtime.    . cholestyramine (QUESTRAN) 4 g packet Take 1 packet (4 g total) by mouth 2 (two) times daily. (Patient taking differently: Take 4 g by mouth daily. ) 60 each 5  . diazepam (VALIUM) 5  MG tablet TAKE 1 TABLET (5 MG TOTAL) BY MOUTH EVERY 12 (TWELVE) HOURS AS NEEDED. FOR ANXIETY 60 tablet 1  . diclofenac (VOLTAREN) 50 MG EC tablet Take 50 mg by mouth daily.     Marland Kitchen estradiol (ESTRACE) 0.5 MG tablet Take 1/2 tab po q day (Patient taking differently: Take 0.25 mg by mouth daily. Take 1/2 tab po q day) 45 tablet 4  . fluticasone (CUTIVATE) 0.05 % cream Apply 1 application topically daily as needed (scalp psoriasis).   1  . golimumab (SIMPONI ARIA) 50 MG/4ML SOLN injection Inject 50 mg into the vein See admin instructions. Given every 2 months - Last given 06/28/2019    . hydrochlorothiazide (HYDRODIURIL) 25 MG tablet TAKE 1 AND 1/2 TABLETS DAILY BY MOUTH 135 tablet 1  . medroxyPROGESTERone (PROVERA) 2.5 MG tablet Take 0.5 tablets (1.25 mg total) by mouth daily. 45 tablet 4  . sertraline (ZOLOFT) 25 MG tablet TAKE 1 TABLET BY MOUTH EVERY DAY IN THE MORNING 90 tablet 1  . Vitamin D, Ergocalciferol, (DRISDOL) 1.25 MG (50000 UNIT) CAPS capsule TAKE 1 CAPSULE (50,000 UNITS TOTAL) BY MOUTH EVERY 7 (SEVEN) DAYS. 12 capsule 1   No current facility-administered medications for this visit.    Allergies  Allergen Reactions  . Codeine Nausea And Vomiting  . Hornet Venom   . Penicillins Itching and Swelling    Has patient had a PCN reaction causing immediate rash, facial/tongue/throat swelling, SOB or lightheadedness  with hypotension: Yes Has patient had a PCN reaction causing severe rash involving mucus membranes or skin necrosis: No Has patient had a PCN reaction that required hospitalization: No Has patient had a PCN reaction occurring within the last 10 years: No If all of the above answers are "NO", then may proceed with Cephalosporin use.   . [place] Venom Swelling  . Lodine [Etodolac] Rash  . Tramadol Itching and Rash    Review of Systems:  Constitutional: Denies fever, chills, diaphoresis, appetite change and fatigue.  HEENT: Denies photophobia, eye pain, redness, hearing loss, ear pain, congestion, sore throat, rhinorrhea, sneezing, mouth sores, neck pain, neck stiffness and tinnitus.   Respiratory: Denies SOB, DOE, cough, chest tightness,  and wheezing.   Cardiovascular: Denies chest pain, palpitations and leg swelling.  Genitourinary: Denies dysuria, urgency, frequency, hematuria, flank pain and difficulty urinating.  Musculoskeletal: Denies myalgias, back pain, joint swelling, arthralgias and gait problem.  Skin: No rash.  Neurological: Denies dizziness, seizures, syncope, weakness, light-headedness, numbness and headaches.  Hematological: Denies adenopathy. Easy bruising, personal or family bleeding history  Psychiatric/Behavioral: Has anxiety or depression     Physical Exam:    BP 126/70   Pulse 83   Ht 5\' 2"  (1.575 m)   Wt 124 lb 2 oz (56.3 kg)   LMP 01/13/2013 Comment: spotting-had benign endometrial biopsy   BMI 22.70 kg/m  Wt Readings from Last 3 Encounters:  09/12/20 124 lb 2 oz (56.3 kg)  09/02/20 119 lb 9.6 oz (54.3 kg)  06/02/20 124 lb (56.2 kg)   Constitutional:  Well-developed, in no acute distress. Psychiatric: Normal mood and affect. Behavior is normal. HEENT: Pupils normal.  Conjunctivae are normal. No scleral icterus. Cardiovascular: Normal rate, regular rhythm. No edema Pulmonary/chest: Effort normal and breath sounds normal. No wheezing, rales or  rhonchi. Abdominal: Soft, nondistended. Nontender. Bowel sounds active throughout. There are no masses palpable. No hepatomegaly. Rectal:  defered Neurological: Alert and oriented to person place and time. Skin: Skin is warm and dry. No rashes noted.  Data Reviewed: I have personally reviewed following labs and imaging studies  CBC: CBC Latest Ref Rng & Units 09/02/2020 05/30/2020 02/14/2020  WBC 3.8 - 10.8 Thousand/uL 5.5 5.0 5.8  Hemoglobin 11.7 - 15.5 g/dL 13.9 14.7 14.4  Hematocrit 35 - 45 % 41.0 42 42.6  Platelets 140 - 400 Thousand/uL 182 184 187    CMP: CMP Latest Ref Rng & Units 09/02/2020 05/30/2020 02/14/2020  Glucose 65 - 99 mg/dL 85 - 92  BUN 7 - 25 mg/dL 27(H) 25(A) 24  Creatinine 0.60 - 0.88 mg/dL 0.79 0.8 0.83  Sodium 135 - 146 mmol/L 136 140 136  Potassium 3.5 - 5.3 mmol/L 4.0 3.9 4.2  Chloride 98 - 110 mmol/L 97(L) 96(A) 94(L)  CO2 20 - 32 mmol/L 31 18 31   Calcium 8.6 - 10.4 mg/dL 9.7 9.8 10.0  Total Protein 6.1 - 8.1 g/dL 6.5 - 6.4  Total Bilirubin 0.2 - 1.2 mg/dL 0.6 - 0.8  Alkaline Phos 25 - 125 - 45 -  AST 10 - 35 U/L 21 54(A) 27  ALT 6 - 29 U/L 6 20 14       Carmell Austria, MD 09/12/2020, 3:37 PM  Cc: Mosie Lukes, MD

## 2020-09-12 NOTE — Patient Instructions (Signed)
If you are age 84 or older, your body mass index should be between 23-30. Your Body mass index is 22.7 kg/m. If this is out of the aforementioned range listed, please consider follow up with your Primary Care Provider.  If you are age 20 or younger, your body mass index should be between 19-25. Your Body mass index is 22.7 kg/m. If this is out of the aformentioned range listed, please consider follow up with your Primary Care Provider.   Due to recent changes in healthcare laws, you may see the results of your imaging and laboratory studies on MyChart before your provider has had a chance to review them.  We understand that in some cases there may be results that are confusing or concerning to you. Not all laboratory results come back in the same time frame and the provider may be waiting for multiple results in order to interpret others.  Please give [place] 48 hours in order for your provider to thoroughly review all the results before contacting the office for clarification of your results.

## 2020-09-16 ENCOUNTER — Ambulatory Visit: Payer: Medicare Other | Admitting: Family Medicine

## 2020-09-18 ENCOUNTER — Encounter: Payer: Self-pay | Admitting: Family Medicine

## 2020-09-30 ENCOUNTER — Other Ambulatory Visit: Payer: Self-pay | Admitting: Obstetrics & Gynecology

## 2020-10-01 ENCOUNTER — Ambulatory Visit: Payer: Medicare Other | Admitting: Pharmacist

## 2020-10-01 DIAGNOSIS — R197 Diarrhea, unspecified: Secondary | ICD-10-CM

## 2020-10-01 DIAGNOSIS — I10 Essential (primary) hypertension: Secondary | ICD-10-CM

## 2020-10-01 NOTE — Chronic Care Management (AMB) (Signed)
Chronic Care Management Pharmacy  Name: Stephanie [name]  MRN: [id number] DOB: 1932-09-09   Chief Complaint/ HPI  Stephanie [name],  86 y.o. , female presents for their Follow-Up CCM visit with the clinical pharmacist via telephone due to COVID-19 Pandemic.  PCP : Mosie Lukes, MD  Their chronic conditions include: Hypertension, Hyperlipidemia, Parkinson's Disease, Depression/Anxiety, Hot flashes, Psoriatic Arthritis, Pain  Office Visits: 09/18/20: Pt message that her BP is elevated. No med changes at time, but Dr. Charlett Blake discussed moving amlodipine to three times per Wylder Macomber.  09/02/20: Visit w/ Dr. Charlett Blake -  No med changes noted.   Consult Visit: 09/12/20: Stephanie [name] visit w/ Dr. Lyndel Safe - Continue cholestamine 4g po d. If constipation started, cut powder in half and use PRN. No need for further colonoscopy.  Medications: Outpatient Encounter Medications as of 10/01/2020  Medication Sig Note   amLODipine (NORVASC) 2.5 MG tablet Take 1 tablet (2.5 mg total) by mouth in the morning and at bedtime.    carbidopa-levodopa (SINEMET IR) 25-100 MG per tablet Take 1 tablet by mouth 4 (four) times daily.  07/19/2019: THROUGHOUT THE [facility]    Carbidopa-Levodopa ER (SINEMET CR) 25-100 MG tablet controlled release Take 1 tablet by mouth at bedtime. 07/19/2019: BEDTIME   cholestyramine (QUESTRAN) 4 g packet Take 1 packet (4 g total) by mouth 2 (two) times daily. (Patient taking differently: Take 4 g by mouth daily. )    diazepam (VALIUM) 5 MG tablet TAKE 1 TABLET (5 MG TOTAL) BY MOUTH EVERY 12 (TWELVE) HOURS AS NEEDED. FOR ANXIETY    diclofenac (VOLTAREN) 50 MG EC tablet Take 50 mg by mouth daily.     estradiol (ESTRACE) 0.5 MG tablet Take 1/2 tab po q Nessa Ramaker (Patient taking differently: Take 0.25 mg by mouth daily. Take 1/2 tab po q Ivonne Freeburg)    fluticasone (CUTIVATE) 0.05 % cream Apply 1 application topically daily as needed (scalp psoriasis).     golimumab (SIMPONI ARIA) 50 MG/4ML SOLN injection Inject 50 mg  into the vein See admin instructions. Given every 2 months - Last given 06/28/2019    hydrochlorothiazide (HYDRODIURIL) 25 MG tablet TAKE 1 AND 1/2 TABLETS DAILY BY MOUTH    medroxyPROGESTERone (PROVERA) 2.5 MG tablet Take 0.5 tablets (1.25 mg total) by mouth daily.    sertraline (ZOLOFT) 25 MG tablet TAKE 1 TABLET BY MOUTH EVERY Dashel Goines IN THE MORNING    Vitamin D, Ergocalciferol, (DRISDOL) 1.25 MG (50000 UNIT) CAPS capsule TAKE 1 CAPSULE (50,000 UNITS TOTAL) BY MOUTH EVERY 7 (SEVEN) DAYS.    No facility-administered encounter medications on file as of 10/01/2020.   SDOH Screenings   Alcohol Screen:    Last Alcohol Screening Score (AUDIT): Not on file  Depression (PHQ2-9): Medium Risk   PHQ-2 Score: 10  Financial Resource Strain: Low Risk    Difficulty of Paying Living Expenses: Not hard at all  Food Insecurity: No Food Insecurity   Worried About Charity fundraiser in the Last Year: Never true   Ran Out of Food in the Last Year: Never true  Housing: Low Risk    Last Housing Risk Score: 0  Physical Activity:    Days of Exercise per Week: Not on file   Minutes of Exercise per Session: Not on file  Social Connections: Moderately Integrated   Frequency of Communication with Friends and Family: Three times a week   Frequency of Social Gatherings with Friends and Family: Once a week   Attends Religious Services: More than  4 times per year   Active Member of Clubs or Organizations: Yes   Attends Archivist Meetings: Not on file   Marital Status: Widowed  Stress:    Feeling of Stress : Not on file  Tobacco Use: Medium Risk   Smoking Tobacco Use: Former Smoker   Smokeless Tobacco Use: Never Used  Transportation Needs: No Data processing manager (Medical): No   Lack of Transportation (Non-Medical): No     Current Diagnosis/Assessment:  Goals Addressed            This Visit's Progress    Chronic Care Management Pharmacy Care  Plan       CARE PLAN ENTRY (see longitudinal plan of care for additional care plan information)  Current Barriers:   Chronic Disease Management support, education, and care coordination needs related to Hypertension, Hyperlipidemia, Parkinson's Disease, Depression/Anxiety, Hot flashes, Psoriatic Arthritis, Pain   Hypertension BP Readings from Last 3 Encounters:  09/12/20 126/70  09/02/20 116/68  06/19/20 (!) 147/84    Pharmacist Clinical Goal(s): o Over the next 90 days, patient will work with PharmD and providers to achieve BP goal <140/90  Current regimen:   Amlodipine 2.79m twice daily  HCTZ 28m1.5 tabs daily  Interventions: o Discussed BP goal  Patient self care activities - Over the next 90 days, patient will: o Check BP daily, document, and provide at future appointments o Ensure daily salt intake < 2300 mg/Stephanie [name]  Hyperlipidemia Lab Results  Component Value Date/Time   LDLCALC 113 (H) 09/02/2020 04:00 PM   LDLDIRECT 120.0 12/11/2014 10:40 AM    Pharmacist Clinical Goal(s): o Over the next 90 days, patient will work with PharmD and providers to maintain LDL goal < 100  Current regimen:  o Diet and exercise management    Interventions: o Discussed diet and exercise  Patient self care activities - Over the next 90 days, patient will: o Continue to monitor diet and exercise  Irritable Bowel Syndrome with Diarrhea  Pharmacist Clinical Goal(s) o Over the next 90 days, patient will work with PharmD and providers to reduce symptoms associated with IBS-D  Current regimen:  o Cholestyramine 4g daily  Interventions: o Coordinated to have patient followed up with PCP o Discussed abx alternatives to take prior to dental procedures  Patient self care activities - Over the next 90 days, patient will: o Maintain IBS-D medication regimen   Medication management  Pharmacist Clinical Goal(s): o Over the next 90 days, patient will work with PharmD and providers  to maintain optimal medication adherence  [facility]  Interventions o Comprehensive medication review performed. o Continue current medication management strategy  Patient self care activities - Over the next 90 days, patient will: o Focus on medication adherence by filling and taking medications appropriately  o Take medications as prescribed o Report any questions or concerns to PharmD and/or provider(s)  Please see past updates related to this goal by clicking on the "Past Updates" button in the selected goal       Social Hx:  Husband died in 20March 17, 2017rom Parkinson's.  Is followed for Parkinson's every 6 months and was told she was very high functioning for [age]ear old.  Lives alone Has a daughter, two grandsons, and a grand-dog. One of her grandson's is a coEngineer, manufacturingnd is getting a scholarship to [place] who she speaks [place]. Retired Does sudoku and love  to watch sports (basketball, football, baseball) She played fast pitched softball.   Hypertension   BP goal is:  <140/90  Office blood pressures are  BP Readings from Last 3 Encounters:  09/12/20 126/70  09/02/20 116/68  06/19/20 (!) 147/84   Patient checks BP at home daily Patient home BP readings are ranging:  134 75 77 95 147 77 82 97 140 68  76 97 144 77 75 98 144 77 75 98  129 76 86 97 Avg 139.6 75 78.5 97  Patient has failed these meds in the past: None noted  Patient is currently controlled on the following medications:   Amlodipine 2.26m twice daily  HCTZ 273m1.5 tabs daily  We discussed BP goal   Update 10/01/20 136 82 148 81 135 79 147 70 112  76 141 80 Avg 136.5 78 Denies HA, chest pain, or dizziness. Does report fatigue.  Plan -Continue current medications   Hyperlipidemia   LDL goal < 100  Last lipids Lab Results  Component Value Date   CHOL 188 09/02/2020   HDL 51 09/02/2020    LDLCALC 113 (H) 09/02/2020   LDLDIRECT 120.0 12/11/2014   TRIG 129 09/02/2020   CHOLHDL 3.7 09/02/2020   Hepatic Function Latest Ref Rng & Units 09/02/2020 05/30/2020 02/14/2020  Total Protein 6.1 - 8.1 g/dL 6.5 - 6.4  Albumin 3.5 - 5.0 g/dL - - -  AST 10 - 35 U/L 21 54(A) 27  ALT 6 - 29 U/L 6 20 14   Alk Phosphatase 25 - 125 - 45 -  Total Bilirubin 0.2 - 1.2 mg/dL 0.6 - 0.8  Bilirubin, Direct 0.0 - 0.3 mg/dL - - -     The ASCVD Risk score (GoPullman et al., 2013) failed to calculate for the following reasons:   The 2013 ASCVD risk score is only valid for ages 4062o 798 Patient has failed these meds in past: None noted  Patient is currently uncontrolled on the following medications:   None  We discussed:  diet and exercise extensively   Exercise Has not been walking due to the heat and cold. Does stretching exercises in bed.  Diet B - yogurt and 1/2 banana and half peach with granola, almonds, and cinnamon, orange juice, black coffee L - 1/2 sandwich, tortilla chips, carrot stick, piece of fruit (she really likes dried apricots), green tea D - Split pea soup, green salad, cornbread, watermelon for dessert Snacks - not usually  Update 10/01/20 LDL trending up (100 vs 113), but noting pt's age would not recommend initiation of statin therapy for primary prevention.  Plan -Continue control with diet and exercise  Parkinson's Disease    Patient has failed these meds in past: None noted  Patient is currently controlled on the following medications:  Carbidopa-levodopa ER 25-10031mt bedtime  Carbidopa-levodopa IR 25-100m37mur times daily  Sometimes she doesn't take all 4 of her IR tabs.  She states she gets dyskinesia if she takes a whole tab every 4 hours, so she takes 1/2 of an IR tab every 2 hours.  Update 10/01/20 Wonder if this could be cause of fatigue.  Plan -Continue current medications   Depression   Depression screen PHQ [facility] 10/01/2020 07/10/2020  03/07/2020  Decreased Interest 3 0 0  Down, Depressed, Hopeless 0 0 0  PHQ - 2 Score 3 0 0  Altered sleeping 1 - -  Tired, decreased energy 3 - -  Change in appetite 0 - -  Feeling  bad or failure about yourself  0 - -  Trouble concentrating 0 - -  Moving slowly or fidgety/restless 3 - -  Suicidal thoughts 0 - -  PHQ-9 Score 10 - -  Difficult doing work/chores Somewhat difficult - -  Some recent data might be hidden    Patient has failed these meds in past: None noted  Patient is currently controlled on the following medications:   Sertraline 24m daily   Diazepam 521mevery 12 hours (only takes 1/2 tab HS)  We discussed:  Started sertraline when her husband was sick. This was a very stressful time for her. She feels fine now and denies being depressed  Update 10/01/20 She reports significant fatigue, but does not think it is depression despite PHQ9 of 10 today. Discussed hydration. Could consider increasing water intake  Plan -Continue current medications  -Increase water intake daily (~64oz/Rykker Coviello)  Hot Flashes    Patient has failed these meds in past: None noted  Patient is currently controlled on the following medications:  Estradiol 0.33m71m/2 tab daily  Medroxyprogesterone 2.33mg66m2 tab daily  She tried to get off of estradiol, but she would get hot flashes every 5 minutes, so she would like to stay on it.  She is aware of the risk, but rather improve her quality of life  We discussed:  That she ever wanted to stop these medications she would need to taper vs stopping completely   Update 10/01/20 She would like to stay on this regimen.  Plan -Continue current medications   Irritable Bowel Syndrome with Diarrhea   Problem Story States she has loose stools, but not diarrhea. She states she has a few solid, but it is much different than her baseline. She feels bloated. Gets abdominal cramps and then has to have a bowel movement shortly after eating. States this  [place]  States she was supposed to have a tooth extraction and was placed on clindamycin. She wonders if this is the cause. She states she only took #2 1 hour before procedure. States she is a 4-5/10 regarding concern. She wonders if her melatonin with artificial sweetner has caused this   Patient has failed these meds in past: None noted  Patient is currently controlled on the following medications:  Cholestyramine 4g daily  Was tolerating cholestyramine really well, started to develop constipation and moved to 1/2 pack daily for 4 days.  Has now not taken any for the last 2 days and is not having diarrhea or constipation. She is very thrilled about no longer having diarrhea. States she is to have dental procedure (implants) soon and was prescribed another abx. She feels it was erythromycin that was prescribed. Recommended patient follow up on the abx recommendation as erythromycin is also probable to cause diarrhea noting her history. Would recommend trial of cephalexin or azithromycin in place of erythromycin.   Patient returned call and she was prescribed azithromycin. This is an agreeable plan.  Plan -Continue current medications  -Confirm which abx prescribed prior to dental procedure. If erythromycin, call [place]Melvenia Beamconsult with dentist about alternative abx to use prior to dental procedure.    Vaccines   Reviewed and discussed patient's vaccination history.    Reviewed and discussed patient's vaccination history.  Pt is up to date on vaccines. She is interested in a booster. Based on her taking Simponi believe she would qualify for a booster in the immunocompromised group.  Immunization History  Administered Date(s)  Administered   Influenza Split 06/29/2019   Influenza Whole 08/15/2012   Influenza, High Dose Seasonal PF 08/19/2015, 08/27/2017, 08/15/2018, 07/12/2020   Influenza,inj,quad, With Preservative 08/15/2017, 08/15/2018     Influenza-Unspecified 08/15/2014, 08/24/2016, 08/15/2018   PFIZER SARS-COV-2 Vaccination 11/25/2019, 12/16/2019   Pneumococcal Conjugate-13 12/11/2014   Pneumococcal Polysaccharide-23 11/24/2011   Pneumococcal-Unspecified 11/18/2014   Td 02/25/2014   Zoster 08/27/2011   Zoster Recombinat (Shingrix) 01/20/2018, 04/07/2018    Plan -Consider receiving covid booster.    De Blanch, PharmD Clinical Pharmacist [facility] at [facility] [phone]

## 2020-10-01 NOTE — Patient Instructions (Signed)
Visit Information  Goals Addressed            This Visit's Progress   . Chronic Care Management Pharmacy Care Plan       CARE PLAN ENTRY (see longitudinal plan of care for additional care plan information)  Current Barriers:  . Chronic Disease Management support, education, and care coordination needs related to Hypertension, Hyperlipidemia, Parkinson's Disease, Depression/Anxiety, Hot flashes, Psoriatic Arthritis, Pain   Hypertension BP Readings from Last 3 Encounters:  09/12/20 126/70  09/02/20 116/68  06/19/20 (!) 147/84   . Pharmacist Clinical Goal(s): o Over the next 90 days, patient will work with PharmD and providers to achieve BP goal <140/90 . Current regimen:  . Amlodipine 2.5mg  twice daily . HCTZ 25mg  1.5 tabs daily . Interventions: o Discussed BP goal . Patient self care activities - Over the next 90 days, patient will: o Check BP daily, document, and provide at future appointments o Ensure daily salt intake < 2300 mg/Stephanie [name]  Hyperlipidemia Lab Results  Component Value Date/Time   LDLCALC 113 (H) 09/02/2020 04:00 PM   LDLDIRECT 120.0 12/11/2014 10:40 AM   . Pharmacist Clinical Goal(s): o Over the next 90 days, patient will work with PharmD and providers to maintain LDL goal < 100 . Current regimen:  o Diet and exercise management   . Interventions: o Discussed diet and exercise . Patient self care activities - Over the next 90 days, patient will: o Continue to monitor diet and exercise  Medication management . Pharmacist Clinical Goal(s): o Over the next 90 days, patient will work with PharmD and providers to maintain optimal medication adherence . [facility] . Interventions o Comprehensive medication review performed. o Continue current medication management strategy . Patient self care activities - Over the next 90 days, patient will: o Focus on medication adherence by filling and taking medications appropriately  o Take medications as  prescribed o Report any questions or concerns to PharmD and/or provider(s)  Please see past updates related to this goal by clicking on the "Past Updates" button in the selected goal         The patient verbalized understanding of instructions, educational materials, and care plan.  Telephone follow up appointment with pharmacy team member scheduled for: 01/06/2021  De Blanch, PharmD Clinical Pharmacist [facility] at [facility] [phone]

## 2020-10-03 DIAGNOSIS — L4050 Arthropathic psoriasis, unspecified: Secondary | ICD-10-CM | POA: Diagnosis not present

## 2020-10-04 ENCOUNTER — Other Ambulatory Visit: Payer: Self-pay | Admitting: Family Medicine

## 2020-10-14 DIAGNOSIS — C672 Malignant neoplasm of lateral wall of bladder: Secondary | ICD-10-CM | POA: Diagnosis not present

## 2020-10-14 DIAGNOSIS — C641 Malignant neoplasm of right kidney, except renal pelvis: Secondary | ICD-10-CM | POA: Diagnosis not present

## 2020-10-27 DIAGNOSIS — Z79899 Other long term (current) drug therapy: Secondary | ICD-10-CM | POA: Diagnosis not present

## 2020-10-27 DIAGNOSIS — G20 Parkinson's disease: Secondary | ICD-10-CM | POA: Diagnosis not present

## 2020-11-12 ENCOUNTER — Other Ambulatory Visit: Payer: Self-pay | Admitting: Medical

## 2020-11-28 ENCOUNTER — Other Ambulatory Visit: Payer: Self-pay | Admitting: Family Medicine

## 2020-12-04 DIAGNOSIS — L4050 Arthropathic psoriasis, unspecified: Secondary | ICD-10-CM | POA: Diagnosis not present

## 2020-12-04 DIAGNOSIS — Z79899 Other long term (current) drug therapy: Secondary | ICD-10-CM | POA: Diagnosis not present

## 2020-12-10 DIAGNOSIS — L409 Psoriasis, unspecified: Secondary | ICD-10-CM | POA: Diagnosis not present

## 2020-12-10 DIAGNOSIS — L821 Other seborrheic keratosis: Secondary | ICD-10-CM | POA: Diagnosis not present

## 2020-12-10 DIAGNOSIS — B078 Other viral warts: Secondary | ICD-10-CM | POA: Diagnosis not present

## 2020-12-10 DIAGNOSIS — L578 Other skin changes due to chronic exposure to nonionizing radiation: Secondary | ICD-10-CM | POA: Diagnosis not present

## 2020-12-10 DIAGNOSIS — D2272 Melanocytic nevi of left lower limb, including hip: Secondary | ICD-10-CM | POA: Diagnosis not present

## 2020-12-10 DIAGNOSIS — D2271 Melanocytic nevi of right lower limb, including hip: Secondary | ICD-10-CM | POA: Diagnosis not present

## 2020-12-10 DIAGNOSIS — R202 Paresthesia of skin: Secondary | ICD-10-CM | POA: Diagnosis not present

## 2020-12-24 ENCOUNTER — Encounter: Payer: Self-pay | Admitting: Cardiology

## 2020-12-24 ENCOUNTER — Telehealth (INDEPENDENT_AMBULATORY_CARE_PROVIDER_SITE_OTHER): Payer: Medicare Other | Admitting: Cardiology

## 2020-12-24 ENCOUNTER — Other Ambulatory Visit: Payer: Self-pay

## 2020-12-24 VITALS — BP 115/83 | HR 82 | Ht 62.0 in | Wt 125.0 lb

## 2020-12-24 DIAGNOSIS — I10 Essential (primary) hypertension: Secondary | ICD-10-CM | POA: Diagnosis not present

## 2020-12-24 DIAGNOSIS — E7800 Pure hypercholesterolemia, unspecified: Secondary | ICD-10-CM | POA: Diagnosis not present

## 2020-12-24 NOTE — Progress Notes (Signed)
Virtual Visit via Video Note   This visit type was conducted due to national recommendations for restrictions regarding the COVID-19 Pandemic (e.g. social distancing) in an effort to limit this patient's exposure and mitigate transmission in our community.  Due to her co-morbid illnesses, this patient is at least at moderate risk for complications without adequate follow up.  This format is felt to be most appropriate for this patient at this time.  All issues noted in this document were discussed and addressed.  A limited physical exam was performed with this format.  Please refer to the patient's chart for her consent to telehealth for [facility].   Date:  12/24/2020   ID:  Stephanie [name], DOB 04/25/1932, MRN [id number] The patient was identified using 2 identifiers.  Patient Location: Home Provider Location: Home Office  PCP:  Mosie Lukes, MD  Cardiologist:  Fransico Him, MD  Electrophysiologist:  None   Evaluation Performed:  Follow-Up Visit  Chief Complaint:  HTN  History of Present Illness:    Stephanie [name] is a 87 y.o. female with a hx of HTN, psoriatic arthritis and HLD.  She is here today for followup and is doing well.  She denies any chest pain or pressure, SOB, DOE, PND, orthopnea, LE edema, dizziness, palpitations or syncope. She is compliant with her meds and is tolerating meds with no SE.    The patient does not have symptoms concerning for COVID-19 infection (fever, chills, cough, or new shortness of breath).    Past Medical History:  Diagnosis Date  . Arthropathy of cervical spine 11/27/2012   Right  Xray at [facility]  On 04/08/2016  AP, lateral, and lateral flexion and extension views of the cervical spine are submitted for evaluation.   No acute fracture identified in the cervical spine.   There is redemonstration of approximately 2 mm of C3 on C4 anterolisthesis in neutral which slightly increases in flexion relative to neutral and extension. Slight C5 on C6  retrolisthesis does not change i  . Benign hypertension without congestive heart failure   . Benign mole    right hcets mole, bleeds when dried with towel  . Bilateral dry eyes   . Bladder cancer ([place]) 2016-10-22  . Cervical spondylosis   . Cervicalgia    2 screws in place   . Depression with anxiety 22-Oct-2016   prior to husbands death   . Gross hematuria    developed after first hip replacment, led to indicental finding of bladder tumor, bleeding now resolved   . History of kidney stones    1970's  . Left hand weakness    due to reinjury of flxor tendon s/p tendon surgery march 12-2018  . Lumbar stenosis   . Malignant tumor of urinary bladder ([place]) 07/2016   Pre-stage I  . OA (osteoarthritis)   . Parkinson disease [facility]) dx 2012   neurologist-  dr siddiqui at [facility]  . Psoriatic arthritis [facility])     Dr Trudie Reed , [place] [facility]   . Rupture of flexor tendon of hand 2020   right   Past Surgical History:  Procedure Laterality Date  . BIOPSY MASS LEFT FIRST METACARPAL   06/23/2006   benign  . CATARACT EXTRACTION W/ INTRAOCULAR LENS  IMPLANT, BILATERAL  1999 and 2003  . CERVICAL FUSION  02/2017   c1-c2 ; reports she has 2 screws 2in long in place done at [facility] ; patient exhibits VERY LIMITED NECK ROM  . CYSTOSCOPY  W/ RETROGRADES Bilateral 09/22/2016   Procedure: CYSTOSCOPY WITH RETROGRADE PYELOGRAM;  Surgeon: Alexis Frock, MD;  Location: [facility];  Service: Urology;  Laterality: Bilateral;  . D & C HYSTEROSCOPY W/ RESECTION POLYP  07/11/2000  . DILATION AND CURETTAGE OF UTERUS  1960's  . EXCISION CYST AND DEBRIDEMENT RIGHT WIRST AND REMOVAL [place] BODY  01/09/2009  . FLEXOR TENDON REPAIR Left 12/12/2018   Procedure: REPAIR/TRANSFER FLEXOR DIGITORUM PROFUNDUS OF LEFT SMALL FINGER;  Surgeon: Daryll Brod, MD;  Location: [place];  Service: Orthopedics;  Laterality: Left;  . LAPAROSCOPY  yrs ago   infertility   . METACARPOPHALANGEAL JOINT  ARTHRODESIS Right 05/25/1996  . TENDON REPAIR Left 01/15/2019   Hand  . TONSILLECTOMY AND ADENOIDECTOMY  child  . TOTAL HIP ARTHROPLASTY Left 07/07/2016   Procedure: LEFT TOTAL HIP ARTHROPLASTY ANTERIOR APPROACH;  Surgeon: Gaynelle Arabian, MD;  Location: [facility];  Service: Orthopedics;  Laterality: Left;  . TOTAL HIP ARTHROPLASTY Right 09/28/2017   Procedure: RIGHT TOTAL HIP ARTHROPLASTY ANTERIOR APPROACH;  Surgeon: Gaynelle Arabian, MD;  Location: [facility];  Service: Orthopedics;  Laterality: Right;  . TOTAL KNEE ARTHROPLASTY Right 07/30/2019   Procedure: TOTAL KNEE ARTHROPLASTY;  Surgeon: Gaynelle Arabian, MD;  Location: [facility];  Service: Orthopedics;  Laterality: Right;  73min  . TRANSURETHRAL RESECTION OF BLADDER TUMOR N/A 09/22/2016   Procedure: TRANSURETHRAL RESECTION OF BLADDER TUMOR (TURBT);  Surgeon: Alexis Frock, MD;  Location[facility];  Service: Urology;  Laterality: N/A;     Current Meds  Medication Sig  . amLODipine (NORVASC) 2.5 MG tablet Take 1 tablet (2.5 mg total) by mouth in the morning and at bedtime.  . carbidopa-levodopa (SINEMET IR) 25-100 MG per tablet Take 1 tablet by mouth 4 (four) times daily.   . Carbidopa-Levodopa ER (SINEMET CR) 25-100 MG tablet controlled release Take 1 tablet by mouth at bedtime.  . diazepam (VALIUM) 5 MG tablet TAKE 1 TABLET (5 MG TOTAL) BY MOUTH EVERY 12 (TWELVE) HOURS AS NEEDED. FOR ANXIETY  . diclofenac (VOLTAREN) 50 MG EC tablet Take 50 mg by mouth daily.   Marland Kitchen estradiol (ESTRACE) 0.5 MG tablet TAKE 1/2 TABLET BY MOUTH DAILY  . fluticasone (CUTIVATE) 0.05 % cream Apply 1 application topically daily as needed (scalp psoriasis).   Marland Kitchen golimumab (SIMPONI ARIA) 50 MG/4ML SOLN injection Inject 50 mg into the vein See admin instructions. Given every 2 months - Last given 06/28/2019  . hydrochlorothiazide (HYDRODIURIL) 25 MG tablet TAKE 1 AND 1/2 TABLETS DAILY BY MOUTH  . medroxyPROGESTERone (PROVERA) 2.5 MG tablet TAKE 0.5 TABLETS (1.25 MG  TOTAL) BY MOUTH DAILY.  Marland Kitchen sertraline (ZOLOFT) 25 MG tablet Take 1 tablet (25 mg total) by mouth daily.  . Vitamin D, Ergocalciferol, (DRISDOL) 1.25 MG (50000 UNIT) CAPS capsule TAKE 1 CAPSULE (50,000 UNITS TOTAL) BY MOUTH EVERY 7 (SEVEN) DAYS.     Allergies:   Clindamycin hcl, Codeine, Doxycycline, Hornet venom, Penicillins, Yellow jacket venom, Lodine [etodolac], and Tramadol   Social History   Tobacco Use  . Smoking status: Former Smoker    Years: 10.00    Quit date: 05/04/1964    Years since quitting: 56.6  . Smokeless tobacco: Never Used  Vaping Use  . Vaping Use: Never used  Substance Use Topics  . Alcohol use: Yes    Alcohol/week: 7.0 standard drinks    Types: 7 Glasses of wine per week  . Drug use: No     Family Hx: The patient's family history  includes Arthritis in her father, mother, and sister; Deep vein thrombosis in her mother; Diabetes in her sister; Heart disease in her maternal grandfather, maternal grandmother, and sister; Hypertension in her mother and sister; Obesity in her sister; Peripheral vascular disease in her paternal grandmother; Stroke in her father and paternal grandfather. There is no history of Colon cancer or Esophageal cancer.  ROS:   Please see the history of present illness.     All other systems reviewed and are negative.   Prior CV studies:   The following studies were reviewed today:    Labs/Other Tests and Data Reviewed:    EKG:  No ECG reviewed.  Recent Labs: 09/02/2020: ALT 6; BUN 27; Creat 0.79; Hemoglobin 13.9; Magnesium 2.2; Platelets 182; Potassium 4.0; Sodium 136; TSH 1.43   Recent Lipid Panel Lab Results  Component Value Date/Time   CHOL 188 09/02/2020 04:00 PM   CHOL 173 12/31/2019 08:52 AM   TRIG 129 09/02/2020 04:00 PM   HDL 51 09/02/2020 04:00 PM   HDL 58 12/31/2019 08:52 AM   CHOLHDL 3.7 09/02/2020 04:00 PM   LDLCALC 113 (H) 09/02/2020 04:00 PM   LDLDIRECT 120.0 12/11/2014 10:40 AM    Wt Readings from Last 3  Encounters:  12/24/20 125 lb (56.7 kg)  09/12/20 124 lb 2 oz (56.3 kg)  09/02/20 119 lb 9.6 oz (54.3 kg)     Risk Assessment/Calculations:      Objective:    Vital Signs:  BP 115/83   Pulse 82   Ht 5\' 2"  (1.575 m)   Wt 125 lb (56.7 kg)   LMP 01/13/2013 Comment: spotting-had benign endometrial biopsy   SpO2 97%   BMI 22.86 kg/m   Well nourished, well developed female in no acute distress. Well appearing, alert and conversant, regular work of breathing,  good skin color  Eyes- anicteric mouth- oral mucosa is pink  neuro- grossly intact skin- no apparent rash or lesions or cyanosis   ASSESSMENT & PLAN:    1.  HTN -BP controlled on exam today -continue amlodipine 2.5mg  daily and HCTZ 37.5mg  daily -SCr was 0.79 in Oct 2021  2.  HLD -LDL goal < 100 -LDL was 113 last fall -encouraged her to cut back on fats and carbs and eat more fruits and veggies  COVID-19 Education: The signs and symptoms of COVID-19 were discussed with the patient and how to seek care for testing (follow up with PCP or arrange E-visit).  The importance of social distancing was discussed today.  Time:   Today, I have spent 20 minutes with the patient with telehealth technology discussing the above problems.     Medication Adjustments/Labs and Tests Ordered: Current medicines are reviewed at length with the patient today.  Concerns regarding medicines are outlined above.   Tests Ordered: No orders of the defined types were placed in this encounter.   Medication Changes: No orders of the defined types were placed in this encounter.   Follow Up:  PRN   Signed, Fransico Him, MD  12/24/2020 10:14 AM    [place]

## 2020-12-24 NOTE — Patient Instructions (Signed)
Medication Instructions:  Your physician recommends that you continue on your current medications as directed. Please refer to the Current Medication list given to you today.  *If you need a refill on your cardiac medications before your next appointment, please call your pharmacy*  Follow-Up: At [facility], you and your health needs are our priority.  As part of our continuing mission to provide you with exceptional heart care, we have created designated Provider Care Teams.  These Care Teams include your primary Cardiologist (physician) and Advanced Practice Providers (APPs -  Physician Assistants and Nurse Practitioners) who all work together to provide you with the care you need, when you need it.  Follow up with Dr. Turner as needed 

## 2020-12-30 NOTE — Progress Notes (Deleted)
Chronic Care Management Pharmacy Note  12/30/2020 Name:  Stephanie [name] MRN:  [id number] DOB:  1932/09/06  Subjective: Stephanie [name] is an 87 y.o. year old female who is a primary patient of Mosie Lukes, MD.  The CCM team was consulted for assistance with disease management and care coordination needs.    Engaged with patient by telephone for follow up visit in response to provider referral for pharmacy case management and/or care coordination services.   Consent to Services:  The patient was given the following information about Chronic Care Management services today, agreed to services, and gave verbal consent: 1. CCM service includes personalized support from designated clinical staff supervised by the primary care provider, including individualized plan of care and coordination with other care providers 2. 24/7 contact phone numbers for assistance for urgent and routine care needs. 3. Service will only be billed when office clinical staff spend 20 minutes or more in a month to coordinate care. 4. Only one practitioner may furnish and bill the service in a calendar month. 5.The patient may stop CCM services at any time (effective at the end of the month) by phone call to the office staff. 6. The patient will be responsible for cost sharing (co-pay) of up to 20% of the service fee (after annual deductible is met). Patient agreed to services and consent obtained.  Patient Care Team: Mosie Lukes, MD as PCP - General (Family Medicine) Sueanne Margarita, MD as PCP - Cardiology (Cardiology) Megan Salon, MD as Consulting Physician (Gynecology) Eldridge Abrahams, MD as Referring Physician (Neurology) Alexis Frock, MD as Consulting Physician (Urology) Katy Fitch, Darlina Guys, MD as Consulting Physician (Ophthalmology) Corene Cornea, DC (Chiropractic Medicine) Jari Pigg, MD as Consulting Physician (Dermatology) Day, Melvenia Beam, [facility] as Pharmacist (Pharmacist)  Social Hx:  Husband  died in 12/23/15 from Parkinson's.  Is followed for Parkinson's every 6 months and was told she was very high functioning for 87 year old.  Lives alone Has a daughter, two grandsons, and a grand-dog. One of her grandson's is a Engineer, manufacturing and is getting a scholarship to [employer]. Has a sister in [place]. Speaks with her sister who she speaks [place] with. Retired Does sudoku and love to watch sports (basketball, football, baseball) She played fast pitched softball.  Recent office visits: 09/02/20 Charlett Blake) - Diarrhea, started on cholestyramine once or twice daily and referred to [facility] ([facility])  Recent consult visits: 12/24/20 Radford Pax, Cardio) - virtual visit: Labs are normal, no changes to medication, recommended more fruits and veggies and less fats to help lipids  09/12/20 (gupta, Gastro) - patient is pleased with Cholestyramine, instructed to cut it in half if she starts having constipation and use as needed  Hospital visits: None in previous 6 months  Social Hx:  Husband died in 12-23-15 from Parkinson's.  Is followed for Parkinson's every 6 months and was told she was very high functioning for 87 year old.  Lives alone Has a daughter, two grandsons, and a grand-dog. One of her grandson's is a Engineer, manufacturing and is getting a scholarship to [employer]. Has a sister in [place]. Speaks with her sister who she speaks [place] with. Retired Does sudoku and love to watch sports (basketball, football, baseball) She played fast pitched softball.  Objective:  Lab Results  Component Value Date   CREATININE 0.79 09/02/2020   BUN 27 (H) 09/02/2020   GFR 72.60 03/13/2018   GFRNONAA 67 05/30/2020   GFRAA >60 07/31/2019  NA 136 09/02/2020   K 4.0 09/02/2020   CALCIUM 9.7 09/02/2020   CO2 31 09/02/2020    Lab Results  Component Value Date/Time   HGBA1C 5.1 02/14/2020 03:27 PM   GFR 72.60 03/13/2018 03:12 PM   GFR 84.94 01/04/2017 01:52 PM    Last  diabetic Eye exam: No results found for: HMDIABEYEEXA  Last diabetic Foot exam: No results found for: HMDIABFOOTEX   Lab Results  Component Value Date   CHOL 188 09/02/2020   HDL 51 09/02/2020   LDLCALC 113 (H) 09/02/2020   LDLDIRECT 120.0 12/11/2014   TRIG 129 09/02/2020   CHOLHDL 3.7 09/02/2020    Hepatic Function Latest Ref Rng & Units 09/02/2020 05/30/2020 02/14/2020  Total Protein 6.1 - 8.1 g/dL 6.5 - 6.4  Albumin 3.5 - 5.0 g/dL - - -  AST 10 - 35 U/L 21 54(A) 27  ALT 6 - 29 U/L _0 Alk Phosphatase 25 - 125 - 45 -  Total Bilirubin 0.2 - 1.2 mg/dL 0.6 - 0.8  Bilirubin, Direct 0.0 - 0.3 mg/dL - - -    Lab Results  Component Value Date/Time   TSH 1.43 09/02/2020 04:00 PM   TSH 1.40 02/14/2020 03:27 PM    CBC Latest Ref Rng & Units 09/02/2020 05/30/2020 02/14/2020  WBC 3.8 - 10.8 Thousand/uL 5.5 5.0 5.8  Hemoglobin 11.7 - 15.5 g/dL 13.9 14.7 14.4  Hematocrit 35.0 - 45.0 % 41.0 42 42.6  Platelets 140 - 400 Thousand/uL 182 184 187    Lab Results  Component Value Date/Time   VD25OH 94 09/02/2020 04:00 PM    Clinical ASCVD: {YES/NO:21197} The ASCVD Risk score (Browning., et al., 2013) failed to calculate for the following reasons:   The 2013 ASCVD risk score is only valid for ages 14 to 34    Depression screen PHQ 2/9 10/01/2020 07/10/2020 03/07/2020  Decreased Interest 3 0 0  Down, Depressed, Hopeless 0 0 0  PHQ - 2 Score 3 0 0  Altered sleeping 1 - -  Tired, decreased energy 3 - -  Change in appetite 0 - -  Feeling bad or failure about yourself  0 - -  Trouble concentrating 0 - -  Moving slowly or fidgety/restless 3 - -  Suicidal thoughts 0 - -  PHQ-9 Score 10 - -  Difficult doing work/chores Somewhat difficult - -  Some recent data might be hidden     ***Other: (CHADS2VASc if Afib, MMRC or CAT for COPD, ACT, DEXA)  Social History   Tobacco Use  Smoking Status Former Smoker  . Years: 10.00  . Quit date: 05/04/1964  . Years since quitting: 56.6   Smokeless Tobacco Never Used   BP Readings from Last 3 Encounters:  12/24/20 115/83  09/12/20 126/70  09/02/20 116/68   Pulse Readings from Last 3 Encounters:  12/24/20 82  09/12/20 83  09/02/20 88   Wt Readings from Last 3 Encounters:  12/24/20 125 lb (56.7 kg)  09/12/20 124 lb 2 oz (56.3 kg)  09/02/20 119 lb 9.6 oz (54.3 kg)    Assessment/Interventions: Review of patient past medical history, allergies, medications, health status, including review of consultants reports, laboratory and other test data, was performed as part of comprehensive evaluation and provision of chronic care management services.   SDOH:  (Social Determinants of Health) assessments and interventions performed: {yes/no:20286}   CCM Care Plan  Allergies  Allergen Reactions  . Clindamycin Hcl Diarrhea  . Codeine Nausea And  Vomiting  . Doxycycline Diarrhea  . Hornet Venom   . Penicillins Itching and Swelling    Has patient had a PCN reaction causing immediate rash, facial/tongue/throat swelling, SOB or lightheadedness with hypotension: Yes Has patient had a PCN reaction causing severe rash involving mucus membranes or skin necrosis: No Has patient had a PCN reaction that required hospitalization: No Has patient had a PCN reaction occurring within the last 10 years: No If all of the above answers are "NO", then may proceed with Cephalosporin use.   . Yellow [place]et Venom Swelling  . Lodine [Etodolac] Rash  . Tramadol Itching and Rash    Medications Reviewed Today    Reviewed by Antonieta Iba, RN (Registered Nurse) on 12/24/20 at [facility] List Status: <None>  Medication Order Taking? Sig Documenting Provider Last Dose Status Informant  amLODipine (NORVASC) 2.5 MG tablet [id number] Yes Take 1 tablet (2.5 mg total) by mouth in the morning and at bedtime. Mosie Lukes, MD Taking Active   carbidopa-levodopa (SINEMET IR) 25-100 MG per tablet [id number] Yes Take 1 tablet by mouth 4 (four) times daily.   [provider] Taking Active Self           Med Note Rosemarie Beath, MELISSA B   Thu Jul 19, 2019  3:53 PM) THROUGHOUT THE DAY   Carbidopa-Levodopa ER (SINEMET CR) 25-100 MG tablet controlled release [id number] Yes Take 1 tablet by mouth at bedtime. [provider] Taking Active Self           Med Note Rosemarie Beath, MELISSA B   Thu Jul 19, 2019  3:53 PM) BEDTIME  diazepam (VALIUM) 5 MG tablet [id number] Yes TAKE 1 TABLET (5 MG TOTAL) BY MOUTH EVERY 12 (TWELVE) HOURS AS NEEDED. FOR ANXIETY Mosie Lukes, MD Taking Active   diclofenac (VOLTAREN) 50 MG EC tablet [id number] Yes Take 50 mg by mouth daily.  [provider] Taking Active   estradiol (ESTRACE) 0.5 MG tablet [id number] Yes TAKE 1/2 TABLET BY MOUTH DAILY Mosie Lukes, MD Taking Active   fluticasone (CUTIVATE) 0.05 % cream [id number] Yes Apply 1 application topically daily as needed (scalp psoriasis).  [provider] Taking Active Self  golimumab (SIMPONI ARIA) 50 MG/4ML SOLN injection [id number] Yes Inject 50 mg into the vein See admin instructions. Given every 2 months - Last given 06/28/2019 [provider] Taking Active Self  hydrochlorothiazide (HYDRODIURIL) 25 MG tablet [id number] Yes TAKE 1 AND 1/2 TABLETS DAILY BY MOUTH Mosie Lukes, MD Taking Active   medroxyPROGESTERone (PROVERA) 2.5 MG tablet [id number] Yes TAKE 0.5 TABLETS (1.25 MG TOTAL) BY MOUTH DAILY. Mosie Lukes, MD Taking Active   sertraline (ZOLOFT) 25 MG tablet [id number] Yes Take 1 tablet (25 mg total) by mouth daily. Mosie Lukes, MD Taking Active   Vitamin D, Ergocalciferol, (DRISDOL) 1.25 MG (50000 UNIT) CAPS capsule [id number] Yes TAKE 1 CAPSULE (50,000 UNITS TOTAL) BY MOUTH EVERY 7 (SEVEN) DAYS. Mackie Pai, PA-C Taking Active           Patient Active Problem List   Diagnosis Date Noted  . Diarrhea 09/03/2020  . Vitamin D deficiency 09/03/2020  . OA (osteoarthritis) of knee 07/30/2019  . Tinea corporis 06/08/2019  .  Psoriatic arthritis ([place]) 04/02/2019  . Anemia 03/13/2018  . Hot flashes 03/13/2018  . S/P cervical spinal fusion 03/31/2017  . Neck pain 12/12/2016  . Depression with anxiety 10/17/2016  . Bladder cancer ([place]) 10/17/2016  . OA (osteoarthritis) of hip 07/07/2016  . Spinal  stenosis of lumbar region with radiculopathy 04/08/2016  . Spondylolisthesis of lumbar region 04/08/2016  . Spondylosis of cervical region without myelopathy or radiculopathy 04/08/2016  . Preventative health care 01/18/2016  . Low back pain 01/18/2016  . History of chicken pox   . Hyperlipidemia 12/15/2014  . Allergic rhinitis 06/25/2014  . Allergy to bee sting 06/10/2014  . TMJ tenderness 11/16/2013  . Other malaise and fatigue 05/07/2013  . Arthropathy of cervical spine ([place]) 11/27/2012  . Muscle spasms of neck 11/27/2012  . Parkinson's disease ([place]) 05/24/2012  . Conjunctivochalasis 03/09/2012  . Epiretinal membrane 03/09/2012  . Hyperopia with astigmatism and presbyopia 03/09/2012  . Pseudophakia 03/09/2012  . HTN (hypertension) 08/31/2011  . Right knee pain 08/31/2011  . Benign hypertensive heart disease without heart failure 05/12/2011    Immunization History  Administered Date(s) Administered  . Influenza Split 06/29/2019  . Influenza Whole 08/15/2012  . Influenza, High Dose Seasonal PF 08/19/2015, 08/27/2017, 08/15/2018, 07/12/2020  . Influenza,inj,quad, With Preservative 08/15/2017, 08/15/2018  . Influenza-Unspecified 08/15/2014, 08/24/2016, 08/15/2018  . PFIZER(Purple Top)SARS-COV-2 Vaccination 11/25/2019, 12/16/2019  . Pneumococcal Conjugate-13 12/11/2014  . Pneumococcal Polysaccharide-23 11/24/2011  . Pneumococcal-Unspecified 11/18/2014  . Td 02/25/2014  . Zoster 08/27/2011  . Zoster Recombinat (Shingrix) 01/20/2018, 04/07/2018    Conditions to be addressed/monitored:  Hypertension, Hyperlipidemia, Parkinson's Disease, Depression/Anxiety, Hot flashes, Psoriatic Arthritis, Pain  There  are no care plans that you recently modified to display for this patient.    Medication Assistance: {MEDASSISTANCEINFO:25044}  Patient's preferred pharmacy is:  [id number] [place] [address]Phone: [phone]: [phone] Uses pill box? {Yes or If no, why not?:20788} Pt endorses ***% compliance  We discussed: {Pharmacy options:24294} Patient decided to: {US Pharmacy Plan:23885}  Care Plan and Follow Up Patient Decision:  {FOLLOWUP:24991}  Plan: {CM FOLLOW UP PLAN:25073}  *** Current Barriers:  . {pharmacybarriers:24917} . ***  Pharmacist Clinical Goal(s):  .Marland KitchenOver the next *** days, patient will {PHARMACYGOALCHOICES:24921} through collaboration with PharmD and provider.  . ***  Interventions: . 1:1 collaboration with BMosie Lukes MD regarding development and update of comprehensive plan of care as evidenced by provider attestation and co-signature . Inter-disciplinary care team collaboration (see longitudinal plan of care) . Comprehensive medication review performed; medication list updated in electronic medical record  Hypertension (BP goal {CHL HP UPSTREAM Pharmacist BP ranges:[phone]}) -{CHL Controlled/[phone]} -Current treatment:  Amlodipine 2.532mtwice daily  HCTZ 2564m.5 tabs daily -Medications previously tried: ***  -Current home readings: *** -Current dietary habits: *** -Current exercise habits: *** -{ACTIONS;DENIES/REPORTS:[id number]::"Denies"} hypotensive/hypertensive symptoms -Educated on {CCM BP Counseling:25124} -Counseled to monitor BP at home ***, document, and provide log at future appointments -{CCMPHARMDINTERVENTION:25122}  Hyperlipidemia: (LDL goal < ***) -{CHL Controlled/[phone]} -Current treatment: . *** -Medications previously tried: ***  -Current dietary patterns: *** -Current exercise habits: *** -Educated on {CCM HLD  Counseling:25126} -{CCMPHARMDINTERVENTION:25122}  Depression/Anxiety (Goal: ***) -{CHL Controlled/[phone]} -Current treatment:  Sertraline 38m62mily   Diazepam 5mg 1mry 12 hours (only takes 1/2 tab HS) -Medications previously tried/failed: *** -PHQ9: *** -GAD7: *** -Connected with *** for mental health support -Educated on {CCM mental health counseling:25127} -{CCMPHARMDINTERVENTION:25122}  IBS w/ Diarrhea (Goal: Minimize symptoms) -{CHL Controlled/[phone]} -Current treatment   Cholestyramine 4g daily -Medications previously tried: ***  -{CCMPHARMDINTERVENTION:25122}   Patient Goals/Self-Care Activities . Over the next *** days, patient will:  - {pharmacypatientgoals:24919}  Follow Up Plan: {CM FOLLOW UP PLAN:WNUU:72536}

## 2021-01-06 ENCOUNTER — Encounter: Payer: Self-pay | Admitting: Family Medicine

## 2021-01-06 ENCOUNTER — Other Ambulatory Visit: Payer: Self-pay

## 2021-01-06 ENCOUNTER — Ambulatory Visit (INDEPENDENT_AMBULATORY_CARE_PROVIDER_SITE_OTHER): Payer: Medicare Other | Admitting: Family Medicine

## 2021-01-06 ENCOUNTER — Ambulatory Visit: Payer: Medicare Other

## 2021-01-06 VITALS — BP 112/64 | HR 98 | Temp 97.8°F | Resp 16 | Wt 125.2 lb

## 2021-01-06 DIAGNOSIS — G20 Parkinson's disease: Secondary | ICD-10-CM | POA: Diagnosis not present

## 2021-01-06 DIAGNOSIS — Z8616 Personal history of COVID-19: Secondary | ICD-10-CM | POA: Insufficient documentation

## 2021-01-06 DIAGNOSIS — I10 Essential (primary) hypertension: Secondary | ICD-10-CM

## 2021-01-06 DIAGNOSIS — E559 Vitamin D deficiency, unspecified: Secondary | ICD-10-CM | POA: Diagnosis not present

## 2021-01-06 DIAGNOSIS — U071 COVID-19: Secondary | ICD-10-CM

## 2021-01-06 DIAGNOSIS — E7800 Pure hypercholesterolemia, unspecified: Secondary | ICD-10-CM

## 2021-01-06 NOTE — Assessment & Plan Note (Signed)
Was a little flared with covid but is doing better.

## 2021-01-06 NOTE — Patient Instructions (Addendum)
Get some over the counter Vitamin D 3 and take 2000 IU daily   Hypertension, Adult High blood pressure (hypertension) is when the force of blood pumping through the arteries is too strong. The arteries are the blood vessels that carry blood from the heart throughout the body. Hypertension forces the heart to work harder to pump blood and may cause arteries to become narrow or stiff. Untreated or uncontrolled hypertension can cause a heart attack, heart failure, a stroke, kidney disease, and other problems. A blood pressure reading consists of a higher number over a lower number. Ideally, your blood pressure should be below 120/80. The first ("top") number is called the systolic pressure. It is a measure of the pressure in your arteries as your heart beats. The second ("bottom") number is called the diastolic pressure. It is a measure of the pressure in your arteries as the heart relaxes. What are the causes? The exact cause of this condition is not known. There are some conditions that result in or are related to high blood pressure. What increases the risk? Some risk factors for high blood pressure are under your control. The following factors may make you more likely to develop this condition:  Smoking.  Having type 2 diabetes mellitus, high cholesterol, or both.  Not getting enough exercise or physical activity.  Being overweight.  Having too much fat, sugar, calories, or salt (sodium) in your diet.  Drinking too much alcohol. Some risk factors for high blood pressure may be difficult or impossible to change. Some of these factors include:  Having chronic kidney disease.  Having a family history of high blood pressure.  Age. Risk increases with age.  Race. You may be at higher risk if you are African American.  Gender. Men are at higher risk than women before age 36. After age 4, women are at higher risk than men.  Having obstructive sleep apnea.  Stress. What are the signs  or symptoms? High blood pressure may not cause symptoms. Very high blood pressure (hypertensive crisis) may cause:  Headache.  Anxiety.  Shortness of breath.  Nosebleed.  Nausea and vomiting.  Vision changes.  Severe chest pain.  Seizures. How is this diagnosed? This condition is diagnosed by measuring your blood pressure while you are seated, with your arm resting on a flat surface, your legs uncrossed, and your feet flat on the floor. The cuff of the blood pressure monitor will be placed directly against the skin of your upper arm at the level of your heart. It should be measured at least twice using the same arm. Certain conditions can cause a difference in blood pressure between your right and left arms. Certain factors can cause blood pressure readings to be lower or higher than normal for a short period of time:  When your blood pressure is higher when you are in a health care provider's office than when you are at home, this is called white coat hypertension. Most people with this condition do not need medicines.  When your blood pressure is higher at home than when you are in a health care provider's office, this is called masked hypertension. Most people with this condition may need medicines to control blood pressure. If you have a high blood pressure reading during one visit or you have normal blood pressure with other risk factors, you may be asked to:  Return on a different day to have your blood pressure checked again.  Monitor your blood pressure at home for 1  week or longer. If you are diagnosed with hypertension, you may have other blood or imaging tests to help your health care provider understand your overall risk for other conditions. How is this treated? This condition is treated by making healthy lifestyle changes, such as eating healthy foods, exercising more, and reducing your alcohol intake. Your health care provider may prescribe medicine if lifestyle changes  are not enough to get your blood pressure under control, and if:  Your systolic blood pressure is above 130.  Your diastolic blood pressure is above 80. Your personal target blood pressure may vary depending on your medical conditions, your age, and other factors. Follow these instructions at home: Eating and drinking  Eat a diet that is high in fiber and potassium, and low in sodium, added sugar, and fat. An example eating plan is called the DASH (Dietary Approaches to Stop Hypertension) diet. To eat this way: ? Eat plenty of fresh fruits and vegetables. Try to fill one half of your plate at each meal with fruits and vegetables. ? Eat whole grains, such as whole-wheat pasta, brown rice, or whole-grain bread. Fill about one fourth of your plate with whole grains. ? Eat or drink low-fat dairy products, such as skim milk or low-fat yogurt. ? Avoid fatty cuts of meat, processed or cured meats, and poultry with skin. Fill about one fourth of your plate with lean proteins, such as fish, chicken without skin, beans, eggs, or tofu. ? Avoid pre-made and processed foods. These tend to be higher in sodium, added sugar, and fat.  Reduce your daily sodium intake. Most people with hypertension should eat less than 1,500 mg of sodium a day.  Do not drink alcohol if: ? Your health care provider tells you not to drink. ? You are pregnant, may be pregnant, or are planning to become pregnant.  If you drink alcohol: ? Limit how much you use to:  0-1 drink a day for women.  0-2 drinks a day for men. ? Be aware of how much alcohol is in your drink. In the [place] one drink equals one 12 oz bottle of beer (355 mL), one 5 oz glass of wine (148 mL), or one 1 oz glass of hard liquor (44 mL).   Lifestyle  Work with your health care provider to maintain a healthy body weight or to lose weight. Ask what an ideal weight is for you.  Get at least 30 minutes of exercise most days of the week. Activities may include  walking, swimming, or biking.  Include exercise to strengthen your muscles (resistance exercise), such as Pilates or lifting weights, as part of your weekly exercise routine. Try to do these types of exercises for 30 minutes at least 3 days a week.  Do not use any products that contain nicotine or tobacco, such as cigarettes, e-cigarettes, and chewing tobacco. If you need help quitting, ask your health care provider.  Monitor your blood pressure at home as told by your health care provider.  Keep all follow-up visits as told by your health care provider. This is important.   Medicines  Take over-the-counter and prescription medicines only as told by your health care provider. Follow directions carefully. Blood pressure medicines must be taken as prescribed.  Do not skip doses of blood pressure medicine. Doing this puts you at risk for problems and can make the medicine less effective.  Ask your health care provider about side effects or reactions to medicines that you should watch for. Contact  a health care provider if you:  Think you are having a reaction to a medicine you are taking.  Have headaches that keep coming back (recurring).  Feel dizzy.  Have swelling in your ankles.  Have trouble with your vision. Get help right away if you:  Develop a severe headache or confusion.  Have unusual weakness or numbness.  Feel faint.  Have severe pain in your chest or abdomen.  Vomit repeatedly.  Have trouble breathing. Summary  Hypertension is when the force of blood pumping through your arteries is too strong. If this condition is not controlled, it may put you at risk for serious complications.  Your personal target blood pressure may vary depending on your medical conditions, your age, and other factors. For most people, a normal blood pressure is less than 120/80.  Hypertension is treated with lifestyle changes, medicines, or a combination of both. Lifestyle changes include  losing weight, eating a healthy, low-sodium diet, exercising more, and limiting alcohol. This information is not intended to replace advice given to you by your health care provider. Make sure you discuss any questions you have with your health care provider. Document Revised: 07/12/2018 Document Reviewed: 07/12/2018 Elsevier Patient Education  2021 [employer].

## 2021-01-06 NOTE — Assessment & Plan Note (Signed)
She ws exposed on Christmas eve and diagnosed on 12/29 and she feels she is fully recovered.

## 2021-01-06 NOTE — Assessment & Plan Note (Signed)
Well controlled, no changes to meds. Encouraged heart healthy diet such as the DASH diet and exercise as tolerated.  °

## 2021-01-07 NOTE — Progress Notes (Signed)
Subjective:    Patient ID: Stephanie [name], female    DOB: [birth year], 87 y.o.   MRN: [id number]  Chief Complaint  Patient presents with  . Follow-up  . Hypertension    HPI Patient is in today for follow up on chronic medical concerns. No recent febrile illness or hospitalizations. She recently had covid. She ws exposed on Christmas eve and diagnosed on 12/29 and she feels she is fully recovered. Some fatigue is noted. Denies CP/palp/SOB/HA/congestion/fevers/GI or GU c/o. Taking meds as prescribed  Past Medical History:  Diagnosis Date  . Arthropathy of cervical spine 11/27/2012   Right  Xray at [facility]  On 04/08/2016  AP, lateral, and lateral flexion and extension views of the cervical spine are submitted for evaluation.   No acute fracture identified in the cervical spine.   There is redemonstration of approximately 2 mm of C3 on C4 anterolisthesis in neutral which slightly increases in flexion relative to neutral and extension. Slight C5 on C6 retrolisthesis does not change i  . Benign hypertension without congestive heart failure   . Benign mole    right hcets mole, bleeds when dried with towel  . Bilateral dry eyes   . Bladder cancer ([place]) 2016-10-19  . Cervical spondylosis   . Cervicalgia    2 screws in place   . Depression with anxiety 10/19/16   prior to husbands death   . Gross hematuria    developed after first hip replacment, led to indicental finding of bladder tumor, bleeding now resolved   . History of kidney stones    1970's  . Left hand weakness    due to reinjury of flxor tendon s/p tendon surgery march 12-2018  . Lumbar stenosis   . Malignant tumor of urinary bladder ([place]) 07/2016   Pre-stage I  . OA (osteoarthritis)   . Parkinson disease [facility]) dx 2012   neurologist-  dr siddiqui at [facility]  . Psoriatic arthritis [facility])     Dr Trudie Reed , [place] [facility]   . Rupture of flexor tendon of hand 2020   right    Past Surgical History:  Procedure Laterality Date  .  BIOPSY MASS LEFT FIRST METACARPAL   06/23/2006   benign  . CATARACT EXTRACTION W/ INTRAOCULAR LENS  IMPLANT, BILATERAL  1999 and 2003  . CERVICAL FUSION  02/2017   c1-c2 ; reports she has 2 screws 2in long in place done at [facility] ; patient exhibits VERY LIMITED NECK ROM  . CYSTOSCOPY W/ RETROGRADES Bilateral 09/22/2016   Procedure: CYSTOSCOPY WITH RETROGRADE PYELOGRAM;  Surgeon: Alexis Frock, MD;  Location: [facility];  Service: Urology;  Laterality: Bilateral;  . D & C HYSTEROSCOPY W/ RESECTION POLYP  07/11/2000  . DILATION AND CURETTAGE OF UTERUS  1960's  . EXCISION CYST AND DEBRIDEMENT RIGHT WIRST AND REMOVAL [place] BODY  01/09/2009  . FLEXOR TENDON REPAIR Left 12/12/2018   Procedure: REPAIR/TRANSFER FLEXOR DIGITORUM PROFUNDUS OF LEFT SMALL FINGER;  Surgeon: Daryll Brod, MD;  Location: [place];  Service: Orthopedics;  Laterality: Left;  . LAPAROSCOPY  yrs ago   infertility   . METACARPOPHALANGEAL JOINT ARTHRODESIS Right 05/25/1996  . TENDON REPAIR Left 01/15/2019   Hand  . TONSILLECTOMY AND ADENOIDECTOMY  child  . TOTAL HIP ARTHROPLASTY Left 07/07/2016   Procedure: LEFT TOTAL HIP ARTHROPLASTY ANTERIOR APPROACH;  Surgeon: Gaynelle Arabian, MD;  Location: [facility];  Service: Orthopedics;  Laterality: Left;  . TOTAL HIP ARTHROPLASTY Right 09/28/2017  Procedure: RIGHT TOTAL HIP ARTHROPLASTY ANTERIOR APPROACH;  Surgeon: Gaynelle Arabian, MD;  Location: [facility];  Service: Orthopedics;  Laterality: Right;  . TOTAL KNEE ARTHROPLASTY Right 07/30/2019   Procedure: TOTAL KNEE ARTHROPLASTY;  Surgeon: Gaynelle Arabian, MD;  Location: [facility];  Service: Orthopedics;  Laterality: Right;  41min  . TRANSURETHRAL RESECTION OF BLADDER TUMOR N/A 09/22/2016   Procedure: TRANSURETHRAL RESECTION OF BLADDER TUMOR (TURBT);  Surgeon: Alexis Frock, MD;  Location: [facility];  Service: Urology;  Laterality: N/A;    Family History  Problem Relation Age of Onset   . Arthritis Mother   . Hypertension Mother   . Deep vein thrombosis Mother        recurrent, secondary to Bleeding disorder  . Arthritis Father   . Stroke Father   . Diabetes Sister   . Heart disease Sister   . Obesity Sister   . Arthritis Sister   . Hypertension Sister   . Heart disease Maternal Grandmother   . Heart disease Maternal Grandfather   . Peripheral vascular disease Paternal Grandmother        s/p leg amputation  . Stroke Paternal Grandfather   . Colon cancer Neg Hx   . Esophageal cancer Neg Hx     Social History   Socioeconomic History  . Marital status: Widowed    Spouse name: Not on file  . Number of children: Not on file  . Years of education: Not on file  . Highest education level: Not on file  Occupational History  . Not on file  Tobacco Use  . Smoking status: Former Smoker    Years: 10.00    Quit date: 05/04/1964    Years since quitting: 56.7  . Smokeless tobacco: Never Used  Vaping Use  . Vaping Use: Never used  Substance and Sexual Activity  . Alcohol use: Yes    Alcohol/week: 7.0 standard drinks    Types: 7 Glasses of wine per week  . Drug use: No  . Sexual activity: Not Currently    Partners: Male  Other Topics Concern  . Not on file  Social History Narrative  . Not on file   Social Determinants of Health   Financial Resource Strain: Low Risk   . Difficulty of Paying Living Expenses: Not hard at all  Food Insecurity: No Food Insecurity  . Worried About Charity fundraiser in the Last Year: Never true  . Ran Out of Food in the Last Year: Never true  Transportation Needs: No Transportation Needs  . Lack of Transportation (Medical): No  . Lack of Transportation (Non-Medical): No  Physical Activity: Not on file  Stress: Not on file  Social Connections: Moderately Integrated  . Frequency of Communication with Friends and Family: Three times a week  . Frequency of Social Gatherings with Friends and Family: Once a week  . Attends  Religious Services: More than 4 times per year  . Active Member of Clubs or Organizations: Yes  . Attends Archivist Meetings: Not on file  . Marital Status: Widowed  Intimate Partner Violence: Not on file    Outpatient Medications Prior to Visit  Medication Sig Dispense Refill  . amLODipine (NORVASC) 2.5 MG tablet Take 1 tablet (2.5 mg total) by mouth in the morning and at bedtime. 60 tablet 5  . carbidopa-levodopa (SINEMET IR) 25-100 MG per tablet Take 1 tablet by mouth 4 (four) times daily.     . Carbidopa-Levodopa ER (SINEMET CR) 25-100 MG tablet  controlled release Take 1 tablet by mouth at bedtime.    . diazepam (VALIUM) 5 MG tablet TAKE 1 TABLET (5 MG TOTAL) BY MOUTH EVERY 12 (TWELVE) HOURS AS NEEDED. FOR ANXIETY 60 tablet 1  . diclofenac (VOLTAREN) 50 MG EC tablet Take 50 mg by mouth daily.     Marland Kitchen estradiol (ESTRACE) 0.5 MG tablet TAKE 1/2 TABLET BY MOUTH DAILY 45 tablet 1  . fluticasone (CUTIVATE) 0.05 % cream Apply 1 application topically daily as needed (scalp psoriasis).   1  . golimumab (SIMPONI ARIA) 50 MG/4ML SOLN injection Inject 50 mg into the vein See admin instructions. Given every 2 months - Last given 06/28/2019    . hydrochlorothiazide (HYDRODIURIL) 25 MG tablet TAKE 1 AND 1/2 TABLETS DAILY BY MOUTH 135 tablet 1  . medroxyPROGESTERone (PROVERA) 2.5 MG tablet TAKE 0.5 TABLETS (1.25 MG TOTAL) BY MOUTH DAILY. 45 tablet 3  . sertraline (ZOLOFT) 25 MG tablet Take 1 tablet (25 mg total) by mouth daily. 90 tablet 1  . Vitamin D, Ergocalciferol, (DRISDOL) 1.25 MG (50000 UNIT) CAPS capsule TAKE 1 CAPSULE (50,000 UNITS TOTAL) BY MOUTH EVERY 7 (SEVEN) DAYS. 12 capsule 1   No facility-administered medications prior to visit.    Allergies  Allergen Reactions  . Clindamycin Hcl Diarrhea  . Codeine Nausea And Vomiting  . Doxycycline Diarrhea  . Hornet Venom   . Penicillins Itching and Swelling    Has patient had a PCN reaction causing immediate rash, facial/tongue/throat  swelling, SOB or lightheadedness with hypotension: Yes Has patient had a PCN reaction causing severe rash involving mucus membranes or skin necrosis: No Has patient had a PCN reaction that required hospitalization: No Has patient had a PCN reaction occurring within the last 10 years: No If all of the above answers are "NO", then may proceed with Cephalosporin use.   . [place]et Venom Swelling  . Lodine [Etodolac] Rash  . Tramadol Itching and Rash    Review of Systems  Constitutional: Positive for malaise/fatigue. Negative for fever.  HENT: Negative for congestion.   Eyes: Negative for blurred vision.  Respiratory: Negative for shortness of breath.   Cardiovascular: Negative for chest pain, palpitations and leg swelling.  Gastrointestinal: Negative for abdominal pain, blood in stool and nausea.  Genitourinary: Negative for dysuria and frequency.  Musculoskeletal: Negative for falls.  Skin: Negative for rash.  Neurological: Negative for dizziness, loss of consciousness and headaches.  Endo/Heme/Allergies: Negative for environmental allergies.  Psychiatric/Behavioral: Negative for depression. The patient is not nervous/anxious.        Objective:    Physical Exam Vitals and nursing note reviewed.  Constitutional:      General: She is not in acute distress.    Appearance: She is well-developed and well-nourished.  HENT:     Head: Normocephalic and atraumatic.     Nose: Nose normal.  Eyes:     General:        Right eye: No discharge.        Left eye: No discharge.  Cardiovascular:     Rate and Rhythm: Normal rate and regular rhythm.     Heart sounds: No murmur heard.   Pulmonary:     Effort: Pulmonary effort is normal.     Breath sounds: Normal breath sounds.  Abdominal:     General: Bowel sounds are normal.     Palpations: Abdomen is soft.     Tenderness: There is no abdominal tenderness.  Musculoskeletal:  General: No edema.     Cervical back: Normal range  of motion and neck supple.  Skin:    General: Skin is warm and dry.  Neurological:     Mental Status: She is alert and oriented to person, place, and time.  Psychiatric:        Mood and Affect: Mood and affect normal.     BP 112/64   Pulse 98   Temp 97.8 F (36.6 C)   Resp 16   Wt 125 lb 3.2 oz (56.8 kg)   LMP 01/13/2013 Comment: spotting-had benign endometrial biopsy   SpO2 99%   BMI 22.90 kg/m  Wt Readings from Last 3 Encounters:  01/06/21 125 lb 3.2 oz (56.8 kg)  12/24/20 125 lb (56.7 kg)  09/12/20 124 lb 2 oz (56.3 kg)    Diabetic Foot Exam - Simple   No data filed    Lab Results  Component Value Date   WBC 5.5 09/02/2020   HGB 13.9 09/02/2020   HCT 41.0 09/02/2020   PLT 182 09/02/2020   GLUCOSE 85 09/02/2020   CHOL 188 09/02/2020   TRIG 129 09/02/2020   HDL 51 09/02/2020   LDLDIRECT 120.0 12/11/2014   LDLCALC 113 (H) 09/02/2020   ALT 6 09/02/2020   AST 21 09/02/2020   NA 136 09/02/2020   K 4.0 09/02/2020   CL 97 (L) 09/02/2020   CREATININE 0.79 09/02/2020   BUN 27 (H) 09/02/2020   CO2 31 09/02/2020   TSH 1.43 09/02/2020   INR 1.0 07/25/2019   HGBA1C 5.1 02/14/2020    Lab Results  Component Value Date   TSH 1.43 09/02/2020   Lab Results  Component Value Date   WBC 5.5 09/02/2020   HGB 13.9 09/02/2020   HCT 41.0 09/02/2020   MCV 93.0 09/02/2020   PLT 182 09/02/2020   Lab Results  Component Value Date   NA 136 09/02/2020   K 4.0 09/02/2020   CO2 31 09/02/2020   GLUCOSE 85 09/02/2020   BUN 27 (H) 09/02/2020   CREATININE 0.79 09/02/2020   BILITOT 0.6 09/02/2020   ALKPHOS 45 05/30/2020   AST 21 09/02/2020   ALT 6 09/02/2020   PROT 6.5 09/02/2020   ALBUMIN 5.2 (H) 07/25/2019   CALCIUM 9.7 09/02/2020   ANIONGAP 10 07/31/2019   GFR 72.60 03/13/2018   Lab Results  Component Value Date   CHOL 188 09/02/2020   Lab Results  Component Value Date   HDL 51 09/02/2020   Lab Results  Component Value Date   LDLCALC 113 (H) 09/02/2020    Lab Results  Component Value Date   TRIG 129 09/02/2020   Lab Results  Component Value Date   CHOLHDL 3.7 09/02/2020   Lab Results  Component Value Date   HGBA1C 5.1 02/14/2020       Assessment & Plan:   Problem List Items Addressed This Visit    HTN (hypertension)    Well controlled, no changes to meds. Encouraged heart healthy diet such as the DASH diet and exercise as tolerated.       Relevant Orders   CBC   Comprehensive metabolic panel   TSH   Parkinson's disease ([place])    Was a little flared with covid but is doing better.       Hyperlipidemia - Primary   Relevant Orders   Lipid panel   Vitamin D deficiency   Relevant Orders   VITAMIN D 25 Hydroxy (Vit-D Deficiency, Fractures)   COVID-19  She ws exposed on Christmas eve and diagnosed on 12/29 and she feels she is fully recovered.          I have discontinued Kyera Felan. Gato's Vitamin D (Ergocalciferol). I am also having her maintain her carbidopa-levodopa, fluticasone, Simponi Aria, Carbidopa-Levodopa ER, diclofenac, amLODipine, diazepam, hydrochlorothiazide, medroxyPROGESTERone, estradiol, and sertraline.  No orders of the defined types were placed in this encounter.    Penni Homans, MD

## 2021-01-08 ENCOUNTER — Other Ambulatory Visit: Payer: Self-pay | Admitting: Family Medicine

## 2021-01-08 DIAGNOSIS — I10 Essential (primary) hypertension: Secondary | ICD-10-CM

## 2021-01-09 ENCOUNTER — Telehealth: Payer: Self-pay | Admitting: Pharmacist

## 2021-01-09 NOTE — Progress Notes (Signed)
    Chronic Care Management Pharmacy Assistant   Name: Stephanie [name]  MRN: [id number] DOB: 09/16/1932  Reason for Encounter: Adherence Review  PCP : Blyth, Stacey A, MD  Verified Adherence Gap Information Per insurance data patient has met their wellness bundle and annual wellness visit screening Their most recent A1C 5.1 on 02/14/20 and their most recent blood pressure 126/70 on 09/12/20  Follow-Up:  Pharmacist Review   Veronica Mclemore, RMA Clinical Pharmacist Assistant [phone]  

## 2021-01-14 ENCOUNTER — Other Ambulatory Visit: Payer: Self-pay | Admitting: Family Medicine

## 2021-01-14 DIAGNOSIS — I10 Essential (primary) hypertension: Secondary | ICD-10-CM

## 2021-01-21 DIAGNOSIS — L84 Corns and callosities: Secondary | ICD-10-CM | POA: Diagnosis not present

## 2021-01-21 DIAGNOSIS — I739 Peripheral vascular disease, unspecified: Secondary | ICD-10-CM | POA: Diagnosis not present

## 2021-01-21 DIAGNOSIS — L603 Nail dystrophy: Secondary | ICD-10-CM | POA: Diagnosis not present

## 2021-01-22 DIAGNOSIS — H16223 Keratoconjunctivitis sicca, not specified as Sjogren's, bilateral: Secondary | ICD-10-CM | POA: Diagnosis not present

## 2021-01-22 DIAGNOSIS — H26492 Other secondary cataract, left eye: Secondary | ICD-10-CM | POA: Diagnosis not present

## 2021-01-22 DIAGNOSIS — H35371 Puckering of macula, right eye: Secondary | ICD-10-CM | POA: Diagnosis not present

## 2021-01-22 DIAGNOSIS — Z961 Presence of intraocular lens: Secondary | ICD-10-CM | POA: Diagnosis not present

## 2021-01-29 ENCOUNTER — Other Ambulatory Visit: Payer: Self-pay | Admitting: Family Medicine

## 2021-02-02 ENCOUNTER — Other Ambulatory Visit: Payer: Self-pay | Admitting: *Deleted

## 2021-02-02 ENCOUNTER — Encounter: Payer: Self-pay | Admitting: Family Medicine

## 2021-02-02 DIAGNOSIS — R197 Diarrhea, unspecified: Secondary | ICD-10-CM

## 2021-02-02 NOTE — Telephone Encounter (Signed)
Lab appointment was schedule for 02/04/21. Please add lab order

## 2021-02-02 NOTE — Telephone Encounter (Signed)
Orders in 

## 2021-02-04 ENCOUNTER — Other Ambulatory Visit: Payer: Self-pay

## 2021-02-04 ENCOUNTER — Other Ambulatory Visit (INDEPENDENT_AMBULATORY_CARE_PROVIDER_SITE_OTHER): Payer: Medicare Other

## 2021-02-04 DIAGNOSIS — I10 Essential (primary) hypertension: Secondary | ICD-10-CM | POA: Diagnosis not present

## 2021-02-04 DIAGNOSIS — E559 Vitamin D deficiency, unspecified: Secondary | ICD-10-CM | POA: Diagnosis not present

## 2021-02-04 DIAGNOSIS — R197 Diarrhea, unspecified: Secondary | ICD-10-CM | POA: Diagnosis not present

## 2021-02-04 DIAGNOSIS — E7800 Pure hypercholesterolemia, unspecified: Secondary | ICD-10-CM

## 2021-02-04 LAB — TSH: TSH: 1.47 u[IU]/mL (ref 0.35–4.50)

## 2021-02-04 LAB — LIPID PANEL
Cholesterol: 155 mg/dL (ref 0–200)
HDL: 52.9 mg/dL (ref >39.00)
LDL Cholesterol: 77 mg/dL (ref 0–99)
NonHDL: 101.71
Total CHOL/HDL Ratio: 3
Triglycerides: 122 mg/dL (ref 0.0–149.0)
VLDL: 24.4 mg/dL (ref 0.0–40.0)

## 2021-02-04 LAB — VITAMIN D 25 HYDROXY (VIT D DEFICIENCY, FRACTURES): VITD: 73.95 ng/mL (ref 30.00–100.00)

## 2021-02-04 LAB — COMPREHENSIVE METABOLIC PANEL
ALT: 8 U/L (ref 0–35)
AST: 21 U/L (ref 0–37)
Albumin: 4.6 g/dL (ref 3.5–5.2)
Alkaline Phosphatase: 39 U/L (ref 39–117)
BUN: 18 mg/dL (ref 6–23)
CO2: 28 mEq/L (ref 19–32)
Calcium: 9.2 mg/dL (ref 8.4–10.5)
Chloride: 98 mEq/L (ref 96–112)
Creatinine, Ser: 0.76 mg/dL (ref 0.40–1.20)
GFR: 70.63 mL/min (ref >60.00)
Glucose, Bld: 84 mg/dL (ref 70–99)
Potassium: 4.2 mEq/L (ref 3.5–5.1)
Sodium: 135 mEq/L (ref 135–145)
Total Bilirubin: 0.8 mg/dL (ref 0.2–1.2)
Total Protein: 6.2 g/dL (ref 6.0–8.3)

## 2021-02-04 LAB — CBC
HCT: 40.8 % (ref 36.0–46.0)
Hemoglobin: 13.9 g/dL (ref 12.0–15.0)
MCHC: 34.2 g/dL (ref 30.0–36.0)
MCV: 91.2 fl (ref 78.0–100.0)
Platelets: 197 10*3/uL (ref 150.0–400.0)
RBC: 4.47 Mil/uL (ref 3.87–5.11)
RDW: 13.7 % (ref 11.5–15.5)
WBC: 4 10*3/uL (ref 4.0–10.5)

## 2021-02-04 LAB — MAGNESIUM: Magnesium: 2 mg/dL (ref 1.5–2.5)

## 2021-02-04 NOTE — Addendum Note (Signed)
Addended by: Kelle Darting A on: 02/04/2021 02:56 PM   Modules accepted: Orders

## 2021-02-05 DIAGNOSIS — L4050 Arthropathic psoriasis, unspecified: Secondary | ICD-10-CM | POA: Diagnosis not present

## 2021-02-06 LAB — SPECIMEN STATUS REPORT

## 2021-02-06 NOTE — Telephone Encounter (Signed)
Pt is still having diarrhea issues and she turned in stool sample on Wednesday but it has not came back from what I can see.

## 2021-02-09 ENCOUNTER — Telehealth: Payer: Self-pay | Admitting: Gastroenterology

## 2021-02-09 NOTE — Telephone Encounter (Signed)
Pt states about every other day she gets up at 3am has a soft BM that is followed by diarrhea for about an hour. Reports her PCP has tested her for cdiff and that was normal and she is not dehydrated. Reports she also has a lot of gas with the diarrhea. Pt scheduled to see Amy Esterwood PA 02/12/21@3pm . Pt aware of appt.

## 2021-02-09 NOTE — Telephone Encounter (Signed)
Stephanie [name] is no longer here. Please send to [facility].  She is now [employer] nurse

## 2021-02-11 LAB — CLOSTRIDIUM DIFFICILE BY PCR: Toxigenic C. Difficile by PCR: NEGATIVE

## 2021-02-12 ENCOUNTER — Ambulatory Visit (INDEPENDENT_AMBULATORY_CARE_PROVIDER_SITE_OTHER): Payer: Medicare Other | Admitting: Physician Assistant

## 2021-02-12 ENCOUNTER — Encounter: Payer: Self-pay | Admitting: Physician Assistant

## 2021-02-12 ENCOUNTER — Other Ambulatory Visit: Payer: Medicare Other

## 2021-02-12 VITALS — BP 120/66 | HR 93 | Ht 62.0 in | Wt 122.5 lb

## 2021-02-12 DIAGNOSIS — R109 Unspecified abdominal pain: Secondary | ICD-10-CM | POA: Diagnosis not present

## 2021-02-12 DIAGNOSIS — H35371 Puckering of macula, right eye: Secondary | ICD-10-CM | POA: Diagnosis not present

## 2021-02-12 DIAGNOSIS — H16223 Keratoconjunctivitis sicca, not specified as Sjogren's, bilateral: Secondary | ICD-10-CM | POA: Diagnosis not present

## 2021-02-12 DIAGNOSIS — H26492 Other secondary cataract, left eye: Secondary | ICD-10-CM | POA: Diagnosis not present

## 2021-02-12 DIAGNOSIS — R197 Diarrhea, unspecified: Secondary | ICD-10-CM

## 2021-02-12 DIAGNOSIS — S0502XA Injury of conjunctiva and corneal abrasion without foreign body, left eye, initial encounter: Secondary | ICD-10-CM | POA: Diagnosis not present

## 2021-02-12 NOTE — Progress Notes (Signed)
Subjective:    Patient ID: Stephanie [name], female    DOB: Feb 13, 1932, 87 y.o.   MRN: [id number]  HPI Stephanie [name] is a pleasant 52 year old white female, established with Dr. Lyndel Safe who comes in today with complaints of recurrent diarrhea. Patient has history of hypertension, Parkinson's disease, spinal stenosis with radiculopathy, psoriatic arthritis for which she is on Simponi and prior history of bladder cancer. She was seen by Dr. Lyndel Safe in October 2021 with diarrhea and felt likely to have IBS-D.  She had had onset of diarrhea after she had been given clindamycin last summer with a dental procedure.  Patient says she had another antibiotic shortly thereafter. She had stool studies done in September including stool for C. difficile all of which were negative. She was started on a trial of Questran 4 g daily per Dr. Lyndel Safe.  Patient says her diarrhea gradually improved and she was not having any significant problems with diarrhea for couple of months and was not taking Questran.  She has now had recurrence of the diarrhea over the past month or so and describes watery bowel movements this week despite taking the Questran up to 3 times daily. She has not been on any recent antibiotics, no new medications.  She denies any abdominal pain or cramping, no melena or hematochezia.  She says this diarrhea seems to come in "spurts" where she will have diarrhea for several days and then may have a normal bowel movement for a day or 2 and then goes back to diarrhea.  Last night she was up 5 times during the night with loose stools.  She says that she feels like she just does not evacuate very well and or require several trips to the bathroom. She had been advised last week per Dr. Randel Pigg  to increase the Benefiber to twice daily and take the Questran 3 times daily. She did labs on 02/04/2021 which were unremarkable, including TSH which was normal at 1.47.  She did a stool for C. difficile which was  negative.  Patient does not have any history of lactose intolerance, she says she generally avoids artificial sweeteners.  More recently she has been having more problems with abdominal bloating and gas and says her weight is down a couple of pounds.  Last colonoscopy had been done in 2004 per Dr. Delfin Edis and was negative  Review of Systems Pertinent positive and negative review of systems were noted in the above HPI section.  All other review of systems was otherwise negative.  Outpatient Encounter Medications as of 02/12/2021  Medication Sig  . amLODipine (NORVASC) 2.5 MG tablet Take 1 tablet (2.5 mg total) by mouth daily.  . carbidopa-levodopa (SINEMET IR) 25-100 MG per tablet Take 1 tablet by mouth 4 (four) times daily.   . Carbidopa-Levodopa ER (SINEMET CR) 25-100 MG tablet controlled release Take 1 tablet by mouth at bedtime.  . cholestyramine (QUESTRAN) 4 g packet Take 4 g by mouth as needed.  . Coenzyme Q10 (CO Q 10 PO) Take 300 mg by mouth daily in the afternoon.  . diazepam (VALIUM) 5 MG tablet TAKE 1 TABLET (5 MG TOTAL) BY MOUTH EVERY 12 (TWELVE) HOURS AS NEEDED. FOR ANXIETY (Patient taking differently: Take 2.5 mg by mouth at bedtime.)  . diclofenac (VOLTAREN) 50 MG EC tablet Take 50 mg by mouth daily.   Marland Kitchen estradiol (ESTRACE) 0.5 MG tablet TAKE 1/2 TABLET BY MOUTH DAILY  . fluticasone (CUTIVATE) 0.05 % cream Apply 1 application topically daily as  needed (scalp psoriasis).   Marland Kitchen golimumab (SIMPONI ARIA) 50 MG/4ML SOLN injection Inject 50 mg into the vein See admin instructions. Given every 2 months - Last given 06/28/2019  . hydrochlorothiazide (HYDRODIURIL) 25 MG tablet Take 1.5 tablets (37.5 mg total) by mouth daily.  . medroxyPROGESTERone (PROVERA) 2.5 MG tablet TAKE 0.5 TABLETS (1.25 MG TOTAL) BY MOUTH DAILY.  . melatonin 5 MG TABS Take 5 mg by mouth at bedtime as needed.  . Multiple Vitamins-Minerals (CENTRUM SILVER 50+WOMEN) TABS Take by mouth daily.  . Probiotic Product  (PROBIOTIC PO) Take by mouth.  . sertraline (ZOLOFT) 25 MG tablet Take 1 tablet (25 mg total) by mouth daily.  . vitamin C (ASCORBIC ACID) 500 MG tablet Take 500 mg by mouth daily.   No facility-administered encounter medications on file as of 02/12/2021.   Allergies  Allergen Reactions  . Clindamycin Hcl Diarrhea  . Codeine Nausea And Vomiting  . Doxycycline Diarrhea  . Hornet Venom   . Penicillins Itching and Swelling    Has patient had a PCN reaction causing immediate rash, facial/tongue/throat swelling, SOB or lightheadedness with hypotension: Yes Has patient had a PCN reaction causing severe rash involving mucus membranes or skin necrosis: No Has patient had a PCN reaction that required hospitalization: No Has patient had a PCN reaction occurring within the last 10 years: No If all of the above answers are "NO", then may proceed with Cephalosporin use.   . Yellow [place]et Venom Swelling  . Lodine [Etodolac] Rash  . Tramadol Itching and Rash   Patient Active Problem List   Diagnosis Date Noted  . COVID-19 01/06/2021  . Diarrhea 09/03/2020  . Vitamin D deficiency 09/03/2020  . OA (osteoarthritis) of knee 07/30/2019  . Tinea corporis 06/08/2019  . Psoriatic arthritis ([place]) 04/02/2019  . Anemia 03/13/2018  . Hot flashes 03/13/2018  . S/P cervical spinal fusion 03/31/2017  . Neck pain 12/12/2016  . Depression with anxiety 10/17/2016  . Bladder cancer ([place]) 10/17/2016  . OA (osteoarthritis) of hip 07/07/2016  . Spinal stenosis of lumbar region with radiculopathy 04/08/2016  . Spondylolisthesis of lumbar region 04/08/2016  . Spondylosis of cervical region without myelopathy or radiculopathy 04/08/2016  . Preventative health care 01/18/2016  . Low back pain 01/18/2016  . History of chicken pox   . Hyperlipidemia 12/15/2014  . Allergic rhinitis 06/25/2014  . Allergy to bee sting 06/10/2014  . TMJ tenderness 11/16/2013  . Other malaise and fatigue 05/07/2013  . Arthropathy of  cervical spine ([place]) 11/27/2012  . Muscle spasms of neck 11/27/2012  . Parkinson's disease ([place]) 05/24/2012  . Conjunctivochalasis 03/09/2012  . Epiretinal membrane 03/09/2012  . Hyperopia with astigmatism and presbyopia 03/09/2012  . Pseudophakia 03/09/2012  . HTN (hypertension) 08/31/2011  . Right knee pain 08/31/2011  . Benign hypertensive heart disease without heart failure 05/12/2011   Social History   Socioeconomic History  . Marital status: Widowed    Spouse name: Not on file  . Number of children: Not on file  . Years of education: Not on file  . Highest education level: Not on file  Occupational History  . Not on file  Tobacco Use  . Smoking status: Former Smoker    Years: 10.00    Quit date: 05/04/1964    Years since quitting: 56.8  . Smokeless tobacco: Never Used  Vaping Use  . Vaping Use: Never used  Substance and Sexual Activity  . Alcohol use: Not Currently    Alcohol/week: 7.0 standard drinks  Types: 7 Glasses of wine per week    Comment: ocassionally  . Drug use: No  . Sexual activity: Not Currently    Partners: Male  Other Topics Concern  . Not on file  Social History Narrative  . Not on file   Social Determinants of Health   Financial Resource Strain: Low Risk   . Difficulty of Paying Living Expenses: Not hard at all  Food Insecurity: No Food Insecurity  . Worried About Charity fundraiser in the Last Year: Never true  . Ran Out of Food in the Last Year: Never true  Transportation Needs: No Transportation Needs  . Lack of Transportation (Medical): No  . Lack of Transportation (Non-Medical): No  Physical Activity: Not on file  Stress: Not on file  Social Connections: Moderately Integrated  . Frequency of Communication with Friends and Family: Three times a week  . Frequency of Social Gatherings with Friends and Family: Once a week  . Attends Religious Services: More than 4 times per year  . Active Member of Clubs or Organizations: Yes  .  Attends Archivist Meetings: Not on file  . Marital Status: Widowed  Intimate Partner Violence: Not on file    Stephanie [name]'s family history includes Arthritis in her father, mother, and sister; Deep vein thrombosis in her mother; Diabetes in her sister; Heart disease in her maternal grandfather, maternal grandmother, and sister; Hypertension in her mother and sister; Obesity in her sister; Peripheral vascular disease in her paternal grandmother; Stroke in her father and paternal grandfather.      Objective:    Vitals:   02/12/21 1458  BP: 120/66  Pulse: 93    Physical Exam Well-developed well-nourished elderly white female in no acute distress.  Height, Weight 122  BMI 22.4  HEENT; nontraumatic normocephalic, EOMI, PE R LA, sclera anicteric. Oropharynx; not examined today Neck; supple, no JVD Cardiovascular; regular rate and rhythm with S1-S2, no murmur rub or gallop Pulmonary; Clear bilaterally Abdomen; soft, nontender, nondistended, no palpable mass or hepatosplenomegaly, bowel sounds are active Rectal; not done today Skin; benign exam, no jaundice rash or appreciable lesions Extremities; no clubbing cyanosis or edema skin warm and dry Neuro/Psych; alert and oriented x4, grossly nonfocal mood and affect appropriate, slight pill-rolling tremor       Assessment & Plan:   #41 87 year old white female with Parkinson's disease with recurrent diarrhea.  Patient had had diarrhea for a few months last fall, initial onset after a course of clindamycin though all stool studies were negative including C. difficile. She had been started on Questran 4 g once daily at that time for probable IBS-D.  She had gradual resolution of symptoms and has now had recurrence over the past several weeks. Continuing to have diarrhea daily despite Benefiber twice daily and Questran 3 times daily  Stool for C. difficile negative last week and labs were unremarkable  Etiology of her diarrhea is  not entirely clear, not certain this is IBS-D, consider microscopic colitis.  She does take chronic diclofenac but does not feel that she can function without this as she has psoriatic arthritis.  Also on sertraline which can be associated with diarrhea.  Rule out bacterial overgrowth.  #2 hypertension 3.  Prior history of bladder cancer 4.  Psoriatic arthritis-on Simponi 5.  Spinal stenosis with radiculopathy  Plan; stop Benefiber Decrease Questran to 4 g once daily between breakfast and lunch and away from other meds Start Imodium 1 p.o. every morning and  then second dose later in the day if needed We will proceed with breath testing to rule out SIBO. Fecal lactoferrin and stool for lactoferrin Further recommendations pending results of above.  Trying to avoid colonoscopy due to her advanced age, however if we are unable to control her symptoms she may require colonoscopy to include random biopsies, versus a trial of budesonide.  Tiffanye Hartmann Genia Harold PA-C 02/12/2021   Cc: Mosie Lukes, MD

## 2021-02-12 NOTE — Patient Instructions (Addendum)
If you are age 85 or older, your body mass index should be between 23-30. Your Body mass index is 22.41 kg/m. If this is out of the aforementioned range listed, please consider follow up with your Primary Care Provider.  If you are age 81 or younger, your body mass index should be between 19-25. Your Body mass index is 22.41 kg/m. If this is out of the aformentioned range listed, please consider follow up with your Primary Care Provider.   Your provider has requested that you go to the basement level for lab work before leaving today. Press "B" on the elevator. The lab is located at the first door on the left as you exit the elevator.  You have been given a testing kit to check for small intestine bacterial overgrowth (SIBO) which is completed by a company named [facility]. Make sure to return your test in the mail using the return mailing label given to you along with the kit. Your demographic and insurance information have already been sent to the company and they should be in contact with you over the next week regarding this test. [facility] will collect an upfront charge of $99.74 for commercial insurance plans and $209.74 is you are paying cash. Make sure to discuss with [facility] PRIOR to having the test if they have gotten informatoin from your insurance company as to how much your testing will cost out of pocket, if any. Please keep in mind that you will be getting a call from phone number [phone] or a similar number. If you do not hear from them within this time frame, please call our office at [phone].   STOP Benefiber  Take your Cholestyramine 4 gram 1 packet a day,  Between breakfast and lunch  Take Imodium 1 tablet daily I every morning and a second dose later in the day if needed.  Follow up pending at this time  Thank you for entrusting me with your care and choosing [facility].  Amy Esterwood, PA-C

## 2021-02-13 ENCOUNTER — Other Ambulatory Visit: Payer: Medicare Other

## 2021-02-13 DIAGNOSIS — R197 Diarrhea, unspecified: Secondary | ICD-10-CM | POA: Diagnosis not present

## 2021-02-13 DIAGNOSIS — R109 Unspecified abdominal pain: Secondary | ICD-10-CM | POA: Diagnosis not present

## 2021-02-13 DIAGNOSIS — S0502XD Injury of conjunctiva and corneal abrasion without foreign body, left eye, subsequent encounter: Secondary | ICD-10-CM | POA: Diagnosis not present

## 2021-02-19 ENCOUNTER — Telehealth: Payer: Self-pay | Admitting: Physician Assistant

## 2021-02-19 NOTE — Telephone Encounter (Signed)
Discussed with pt that she can call [facility] and check with them regarding the SIBO test, pt verbalized understanding.

## 2021-02-19 NOTE — Telephone Encounter (Signed)
Pt called to inform that [facility] has not contacted her yet about SIBO test.

## 2021-02-20 ENCOUNTER — Telehealth: Payer: Self-pay | Admitting: Physician Assistant

## 2021-02-20 NOTE — Telephone Encounter (Signed)
Patient reports worsening diarrhea.  She has tried the imodium as recommended at office visit on 02/12/21 with Nicoletta Ba PA.  She increased to 4 imodium yesterday with no improvement.  She will try and complete the SIBO test this weekend and get mailed off.  Dr. Lyndel Safe please advise additional tx for diarrhea.  She has multiple episodes daily with urgency and cramping.

## 2021-02-22 DIAGNOSIS — R109 Unspecified abdominal pain: Secondary | ICD-10-CM | POA: Diagnosis not present

## 2021-02-22 DIAGNOSIS — R197 Diarrhea, unspecified: Secondary | ICD-10-CM | POA: Diagnosis not present

## 2021-02-22 LAB — FECAL LACTOFERRIN, QUANT
Fecal Lactoferrin: POSITIVE — AB
MICRO NUMBER:: 11721123
SPECIMEN QUALITY:: ADEQUATE

## 2021-02-22 LAB — PANCREATIC ELASTASE, FECAL: Pancreatic Elastase-1, Stool: >500 mcg/g

## 2021-02-24 NOTE — Telephone Encounter (Addendum)
Patient is advised of this plan. She will increase the Questran to 2 doses, one at mid morning and 1 at mid afternoon. She may take up to 6 Imodium a day IF NEEDED. I did not realize she was not already scheduled for a colonoscopy. I will call her again and bring her back for a pre-visit to be instructed and sign her consent forms.

## 2021-02-24 NOTE — Telephone Encounter (Signed)
Stephanie [name] is still having diarrhea -Please make sure [name] is taking cholestyramine 4g twice a day -Also make sure [name] is taking Imodium. -I have reviewed the stool studies -Proceed with colonoscopy with MiraLAX preparation.   RG

## 2021-02-25 NOTE — Telephone Encounter (Signed)
Thank you :)

## 2021-03-03 ENCOUNTER — Encounter: Payer: Self-pay | Admitting: Family Medicine

## 2021-03-03 ENCOUNTER — Other Ambulatory Visit: Payer: Self-pay | Admitting: Family Medicine

## 2021-03-03 MED ORDER — AMLODIPINE BESYLATE 5 MG PO TABS: 5.0000 mg | ORAL_TABLET | Freq: Every day | ORAL | 3 refills | Status: DC

## 2021-03-04 ENCOUNTER — Other Ambulatory Visit: Payer: Self-pay

## 2021-03-04 ENCOUNTER — Telehealth: Payer: Self-pay

## 2021-03-04 MED ORDER — RIFAXIMIN 550 MG PO TABS: 550.0000 mg | ORAL_TABLET | Freq: Three times a day (TID) | ORAL | 0 refills | Status: DC

## 2021-03-04 NOTE — Telephone Encounter (Signed)
Patient calls back to discuss her results. Last week she began a lactose/gluten free diet. She is no longer taking Imodium or "that powder" Questran. The patient reports that all her symptoms have resolved. She is very pleased. Feels well. She elects to not take Xifaxan at this time. Provider notified. Rx cancelled at the [facility] pharmacy.  She understands to call us if she has a return of her symptoms or other GI concerns.

## 2021-03-04 NOTE — Telephone Encounter (Signed)
-----   Message from Alfredia Ferguson, PA-C sent at 03/04/2021  2:38 PM EDT ----- Regarding: SIBO test result Please call pt and let her know the breath test for SIBO returned positive - I would  like to give her a course of Xifaxan 550 mg po TID x 14 days, thanks

## 2021-03-04 NOTE — Telephone Encounter (Signed)
Called the patient to discuss the results from her breath test and the treatment plan. No answer. Left a brief message and asked she call back to speak with me. Rx sent to her pharmacy in [place] to discover if it is covered and affordable with her insurance.

## 2021-03-05 ENCOUNTER — Other Ambulatory Visit: Payer: Self-pay

## 2021-03-05 MED ORDER — RIFAXIMIN 550 MG PO TABS: 550.0000 mg | ORAL_TABLET | Freq: Three times a day (TID) | ORAL | 0 refills | Status: AC

## 2021-03-05 NOTE — Telephone Encounter (Signed)
Stephanie [name] has changed her mind about the Xifaxan. [name] would like me to inquire on the coverage through her Part D insurance plan. I already know it requires a prior authorization. I will start this for her today.

## 2021-03-06 ENCOUNTER — Telehealth: Payer: Self-pay

## 2021-03-06 NOTE — Telephone Encounter (Signed)
Stephanie [name] I am able to do the PA if it comes through. Thanks

## 2021-03-06 NOTE — Telephone Encounter (Signed)
Prior authorization through Cover My Meds  key  [facility]

## 2021-03-06 NOTE — Telephone Encounter (Signed)
I was successful in getting the medication approved by her insurance. This brought the price to $700.00. The patient cannot pay this.

## 2021-03-09 NOTE — Telephone Encounter (Signed)
Patient called and states she ended up picking up the antibiotic medication. No sample needed also stated the medication is not working.

## 2021-03-09 NOTE — Telephone Encounter (Signed)
Spoke with the patient. Explained to her the Xifaxan course of 2 weeks and that it may take up to the 2 weeks to see marked improvement.

## 2021-03-09 NOTE — Telephone Encounter (Signed)
See message.

## 2021-03-09 NOTE — Telephone Encounter (Signed)
Any chance of samples?

## 2021-03-19 ENCOUNTER — Telehealth: Payer: Self-pay

## 2021-03-19 NOTE — Telephone Encounter (Signed)
Patient called in. States she will be finish with Xifaxan tomorrow but patient still having bad diarrhea.  Best contact [phone]

## 2021-03-19 NOTE — Telephone Encounter (Signed)
No answer. See new encounter of 03/19/21.

## 2021-03-19 NOTE — Telephone Encounter (Signed)
Spoke with patient. She is continuing to have liquid stools twice daily. Some days, about QOD, she will have a hot/cold flash, abdominal pain, then diarrhea. This will keep her in the bathroom for up to 45 minutes or returning to the bathroom over and over for about 45 minutes. Her last dose of Xifaxan is 03/20/21.

## 2021-03-23 ENCOUNTER — Encounter: Payer: Self-pay | Admitting: Family Medicine

## 2021-03-23 MED ORDER — DIAZEPAM 5 MG PO TABS: ORAL_TABLET | ORAL | 0 refills | Status: DC

## 2021-03-23 NOTE — Telephone Encounter (Signed)
Patient calling to follow up on previous message. 

## 2021-03-24 NOTE — Telephone Encounter (Signed)
Left message on machine to call back  

## 2021-03-24 NOTE — Telephone Encounter (Signed)
The pt returned call and states that she is NOT taking either the questran or the imodium.  She has been advised to begin taking those as prescribed and call back with an update in a week.

## 2021-03-24 NOTE — Telephone Encounter (Signed)
I need to know what meds she is taking every day fro her diarrhea currently - is she on Questran - how often?, is she taking Imodium regularly? Does she want to proceed with Colonoscopy ?  Let me know what she is taking - we can try to tweak , maybe add budesonide

## 2021-03-27 ENCOUNTER — Other Ambulatory Visit: Payer: Self-pay | Admitting: Family Medicine

## 2021-03-30 ENCOUNTER — Telehealth: Payer: Self-pay

## 2021-03-30 NOTE — Telephone Encounter (Signed)
Patient calling back stating she has taken the imodium but that is not helping and is still having watery stools.  Please advise.

## 2021-03-30 NOTE — Telephone Encounter (Signed)
Is she still on Gluten free /lactose free diet as she felt better on this ? My understanding is that she is having 3 BM's daily - have her clarify how many times per day she has a BM  She can increase Questran to twice daily  She needs to have colonoscopy schedule with Dr Lyndel Safe - if she declines Colonoscopy we can give her a trial of Budesonide 9 mg daily x 8 weeks , then 6 mg daily x one month, then 3 mg daily x one month.  Let me know what she decides

## 2021-03-30 NOTE — Telephone Encounter (Signed)
The patient resumed a regular diet. At first these measures seemed to help, but that did not last long. She was having a loose bm once to 3 times a day, but that has increased to her present state. She would like to have the colonoscopy because she wants this resolved. Pre-visit 03/31/21 at ;30 pm Colon 04/09/21 at 11:30 am

## 2021-03-30 NOTE — Telephone Encounter (Signed)
Patient is taking once daily Questran. She stopped the Imodium 2 days agao. It was causing her to vomit. She reports the diarrhea is continuous. She has diarrhea every time she visits the toilet.

## 2021-03-31 ENCOUNTER — Ambulatory Visit (AMBULATORY_SURGERY_CENTER): Payer: Self-pay | Admitting: *Deleted

## 2021-03-31 ENCOUNTER — Other Ambulatory Visit: Payer: Self-pay

## 2021-03-31 VITALS — Ht 62.0 in | Wt 120.0 lb

## 2021-03-31 DIAGNOSIS — R197 Diarrhea, unspecified: Secondary | ICD-10-CM

## 2021-03-31 NOTE — Progress Notes (Signed)
No egg or soy allergy known to patient  No issues with past sedation with any surgeries or procedures Patient denies ever being told they had issues or difficulty with intubation  No FH of Malignant Hyperthermia No diet pills per patient No home 02 use per patient  No blood thinners per patient  Pt denies issues with constipation  No A fib or A flutter  EMMI video to pt or via MyChart  COVID 19 guidelines implemented in [place] today with Pt and RN  Pt is fully vaccinated  for Covid   Due to the COVID-19 pandemic we are asking patients to follow certain guidelines.  Pt aware of COVID protocols and [facility] guidelines   

## 2021-04-01 ENCOUNTER — Telehealth: Payer: Self-pay | Admitting: Physician Assistant

## 2021-04-01 NOTE — Telephone Encounter (Signed)
Can she take meds by 830- has to take Parkinson's meds  Q 4 hrs- pt instructed to take med at 830 am and then he will be home before time to take her next dose-   She verbalized understanding

## 2021-04-02 DIAGNOSIS — L4050 Arthropathic psoriasis, unspecified: Secondary | ICD-10-CM | POA: Diagnosis not present

## 2021-04-08 ENCOUNTER — Encounter: Payer: Self-pay | Admitting: Certified Registered Nurse Anesthetist

## 2021-04-09 ENCOUNTER — Other Ambulatory Visit: Payer: Self-pay

## 2021-04-09 ENCOUNTER — Encounter: Payer: Self-pay | Admitting: Gastroenterology

## 2021-04-09 ENCOUNTER — Ambulatory Visit (AMBULATORY_SURGERY_CENTER): Payer: Medicare Other | Admitting: Gastroenterology

## 2021-04-09 VITALS — BP 113/62 | HR 75 | Temp 98.7°F | Resp 13 | Ht 62.0 in | Wt 120.0 lb

## 2021-04-09 DIAGNOSIS — R109 Unspecified abdominal pain: Secondary | ICD-10-CM | POA: Diagnosis not present

## 2021-04-09 DIAGNOSIS — K52831 Collagenous colitis: Secondary | ICD-10-CM | POA: Diagnosis not present

## 2021-04-09 DIAGNOSIS — K5520 Angiodysplasia of colon without hemorrhage: Secondary | ICD-10-CM

## 2021-04-09 DIAGNOSIS — R197 Diarrhea, unspecified: Secondary | ICD-10-CM | POA: Diagnosis not present

## 2021-04-09 DIAGNOSIS — K580 Irritable bowel syndrome with diarrhea: Secondary | ICD-10-CM

## 2021-04-09 DIAGNOSIS — K648 Other hemorrhoids: Secondary | ICD-10-CM | POA: Diagnosis not present

## 2021-04-09 DIAGNOSIS — G20 Parkinson's disease: Secondary | ICD-10-CM | POA: Diagnosis not present

## 2021-04-09 DIAGNOSIS — I10 Essential (primary) hypertension: Secondary | ICD-10-CM | POA: Diagnosis not present

## 2021-04-09 MED ORDER — SODIUM CHLORIDE 0.9 % IV SOLN: 500.0000 mL | Freq: Once | INTRAVENOUS | Status: DC

## 2021-04-09 NOTE — Patient Instructions (Signed)
Resume previous diet and medications. Awaiting pathology results. Increase Questran to 4 Grams twice daily, please take 2 hours before or after rest of your medications.  YOU HAD AN ENDOSCOPIC PROCEDURE TODAY AT [facility]:   Refer to the procedure report that was given to you for any specific questions about what was found during the examination.  If the procedure report does not answer your questions, please call your gastroenterologist to clarify.  If you requested that your care partner not be given the details of your procedure findings, then the procedure report has been included in a sealed envelope for you to review at your convenience later.  YOU SHOULD EXPECT: Some feelings of bloating in the abdomen. Passage of more gas than usual.  Walking can help get rid of the air that was put into your GI tract during the procedure and reduce the bloating. If you had a lower endoscopy (such as a colonoscopy or flexible sigmoidoscopy) you may notice spotting of blood in your stool or on the toilet paper. If you underwent a bowel prep for your procedure, you may not have a normal bowel movement for a few days.  Please Note:  You might notice some irritation and congestion in your nose or some drainage.  This is from the oxygen used during your procedure.  There is no need for concern and it should clear up in a day or so.  SYMPTOMS TO REPORT IMMEDIATELY:   Following lower endoscopy (colonoscopy or flexible sigmoidoscopy):  Excessive amounts of blood in the stool  Significant tenderness or worsening of abdominal pains  Swelling of the abdomen that is new, acute  Fever of 100F or higher   For urgent or emergent issues, a gastroenterologist can be reached at any hour by calling [phone]. Do not use MyChart messaging for urgent concerns.    DIET:  We do recommend a small meal at first, but then you may proceed to your regular diet.  Drink plenty of fluids but you should avoid  alcoholic beverages for 24 hours.  ACTIVITY:  You should plan to take it easy for the rest of today and you should NOT DRIVE or use heavy machinery until tomorrow (because of the sedation medicines used during the test).    FOLLOW UP: Our staff will call the number listed on your records 48-72 hours following your procedure to check on you and address any questions or concerns that you may have regarding the information given to you following your procedure. If we do not reach you, we will leave a message.  We will attempt to reach you two times.  During this call, we will ask if you have developed any symptoms of COVID 19. If you develop any symptoms (ie: fever, flu-like symptoms, shortness of breath, cough etc.) before then, please call [phone].  If you test positive for Covid 19 in the 2 weeks post procedure, please call and report this information to [place].    If any biopsies were taken you will be contacted by phone or by letter within the next 1-3 weeks.  Please call us at [phone] if you have not heard about the biopsies in 3 weeks.    SIGNATURES/CONFIDENTIALITY: You and/or your care partner have signed paperwork which will be entered into your electronic medical record.  These signatures attest to the fact that that the information above on your After Visit Summary has been reviewed and is understood.  Full responsibility of the confidentiality of  this discharge information lies with you and/or your care-partner.

## 2021-04-09 NOTE — Progress Notes (Signed)
Medical history reviewed with no changes noted. VS assessed by S.B

## 2021-04-09 NOTE — Progress Notes (Signed)
Report given to PACU, vss 

## 2021-04-09 NOTE — Progress Notes (Signed)
Called to room to assist during endoscopic procedure.  Patient ID and intended procedure confirmed with present staff. Received instructions for my participation in the procedure from the performing physician.  

## 2021-04-09 NOTE — Op Note (Signed)
[place] Patient Name: Stephanie [name] Procedure Date: 04/09/2021 11:37 AM MRN: [id number] Endoscopist: Jackquline Denmark , MD Age: 87 Referring MD:  Date of Birth: 09-21-1932 Gender: Female Account #: [id number] Procedure:                Colonoscopy Indications:              Clinically significant diarrhea of unexplained                            origin Medicines:                Monitored Anesthesia Care Procedure:                Pre-Anesthesia Assessment:                           - Prior to the procedure, a History and Physical                            was performed, and patient medications and                            allergies were reviewed. The patient's tolerance of                            previous anesthesia was also reviewed. The risks                            and benefits of the procedure and the sedation                            options and risks were discussed with the patient.                            All questions were answered, and informed consent                            was obtained. Prior Anticoagulants: The patient has                            taken no previous anticoagulant or antiplatelet                            agents. ASA Grade Assessment: III - A patient with                            severe systemic disease. After reviewing the risks                            and benefits, the patient was deemed in                            satisfactory condition to undergo the procedure.  After obtaining informed consent, the colonoscope                            was passed under direct vision. Throughout the                            procedure, the patient's blood pressure, pulse, and                            oxygen saturations were monitored continuously. The                            Olympus PFC-[id number] (#[id number]) Colonoscope was                            introduced through the anus and advanced to the 2                             cm into the ileum. The colonoscopy was performed                            without difficulty. The patient tolerated the                            procedure well. The quality of the bowel                            preparation was adequate to identify polyps 6 mm                            and larger in size. There was retained solid                            vegetable material in the cecum limiting                            examination. This could not be fully washed as it                            would clog the suction channel of the scope. The                            terminal ileum, ileocecal valve, appendiceal                            orifice, and rectum were photographed. Scope In: 11:40:40 AM Scope Out: 12:04:03 PM Scope Withdrawal Time: 0 hours 15 minutes 11 seconds  Total Procedure Duration: 0 hours 23 minutes 23 seconds  Findings:                 A localized 1 cm area of mildly erythematous mucosa                            without ulcerations  was found in the proximal                            ascending colon. Biopsies were taken with a cold                            forceps for histology.                           A few small-mouthed diverticula were found in the                            sigmoid colon.                           Otherwise, the colon (entire examined portion)                            appeared normal with well preserved vascular                            pattern. Biopsies for histology were taken with a                            cold forceps from the entire colon for evaluation                            of microscopic colitis.                           A single small angioectasia without bleeding was                            found in the cecum.                           Non-bleeding external and internal hemorrhoids were                            found during retroflexion. The hemorrhoids were                             small.                           The terminal ileum appeared normal. Biopsies were                            taken with a cold forceps for histology.                           The exam was otherwise without abnormality on                            direct and retroflexion views. Complications:            No  immediate complications. Estimated Blood Loss:     Estimated blood loss: none. Impression:               - Localized mild erythematous mucosa in the                            proximal ascending colon. Biopsied.                           - Mild sigmoid diverticulosis.                           - The entire examined colon is normal. Biopsied.                           - A single incidental non-bleeding colonic                            angioectasia.                           - Non-bleeding external and internal hemorrhoids.                           - The examined portion of the ileum was normal.                            Biopsied.                           - The examination was otherwise normal on direct                            and retroflexion views. Recommendation:           - Patient has a contact number available for                            emergencies. The signs and symptoms of potential                            delayed complications were discussed with the                            patient. Return to normal activities tomorrow.                            Written discharge instructions were provided to the                            patient.                           - Resume previous diet.                           - Continue present medications.                           -  Await pathology results.                           - Increase Questran to 4 g p.o. twice daily, please                            take 2 hours before or after rest of the                            medications. #60, 2 refills                           - The findings and recommendations were  discussed                            with the patient's family. Jackquline Denmark, MD 04/09/2021 12:13:42 PM This report has been signed electronically.

## 2021-04-14 ENCOUNTER — Telehealth: Payer: Self-pay

## 2021-04-14 NOTE — Telephone Encounter (Signed)
  Follow up Call-  Call back number 04/09/2021  Post procedure Call Back phone  # [phone]  Permission to leave phone message Yes  Some recent data might be hidden     Patient questions:  Do you have a fever, pain , or abdominal swelling? No. Pain Score  0 *  Have you tolerated food without any problems? Yes.    Have you been able to return to your normal activities? Yes.    Do you have any questions about your discharge instructions: Diet   No. Medications  No. Follow up visit  No.  Do you have questions or concerns about your Care? No.  Actions: * If pain score is 4 or above: No action needed, pain <4.  Pt reported she is still having "watery BM'.. Sometimes she wakes up in the night with watery diarrhea also.  These were the sx that pt had pre-procedure.  I advised her we need to wait for the biopsies report.  She understood. Maw    1. Have you developed a fever since your procedure? no  2.   Have you had an respiratory symptoms (SOB or cough) since your procedure? no  3.   Have you tested positive for COVID 19 since your procedure no  4.   Have you had any family members/close contacts diagnosed with the COVID 19 since your procedure?  no   If yes to any of these questions please route to Joylene John, RN and Joella Prince, RN

## 2021-04-16 ENCOUNTER — Encounter: Payer: Self-pay | Admitting: Gastroenterology

## 2021-04-16 NOTE — Progress Notes (Signed)
Has microscopic colitis on Bx(collagenous colitis)- -Start budesonide 9mg  po qd x 8 weeks, then if better, 6mg  for 2 weeks followed by 3 mg for another 2 weeks. If not better, please call. -Stop NSAIDs including volteran. -Zoloft can also cause diarrhea. But, continue for now. -Can use imodium AD on as needed basis. -Can wean off Questran to 1/day x 4 weeks, then stop  RG

## 2021-04-17 ENCOUNTER — Other Ambulatory Visit: Payer: Self-pay

## 2021-04-17 ENCOUNTER — Telehealth: Payer: Self-pay | Admitting: Gastroenterology

## 2021-04-17 MED ORDER — BUDESONIDE 3 MG PO CPEP: 9.0000 mg | ORAL_CAPSULE | Freq: Every day | ORAL | 3 refills | Status: AC

## 2021-04-17 NOTE — Telephone Encounter (Signed)
Inbound call from patient returning nurse's call in regards to their results.

## 2021-04-17 NOTE — Telephone Encounter (Signed)
Has microscopic colitis on Bx(collagenous colitis)- -Start budesonide 9mg  po qd x 8 weeks, then if better, 6mg  for 2 weeks followed by 3 mg for another 2 weeks. If not better, please call. -Stop NSAIDs including volteran. -Zoloft can also cause diarrhea. But, continue for now. -Can use imodium AD on as needed basis. -Can wean off Questran to 1/day x 4 weeks, then stop  RG

## 2021-04-17 NOTE — Telephone Encounter (Signed)
Spoke with the pt and she has been advised of the prescription for budesonide. She is aware of the diagnosis and will call back in a few weeks with an update.  She will avoid NSAIDS and use imodium as needed.  She will also wean off of Questran.  Pt verbalized understanding of all instructions.

## 2021-04-24 DIAGNOSIS — L603 Nail dystrophy: Secondary | ICD-10-CM | POA: Diagnosis not present

## 2021-04-24 DIAGNOSIS — I739 Peripheral vascular disease, unspecified: Secondary | ICD-10-CM | POA: Diagnosis not present

## 2021-04-24 NOTE — Progress Notes (Signed)
Subjective:   Stephanie [name] is a 87 y.o. female who presents for Medicare Annual (Subsequent) preventive examination.   Review of Systems     Cardiac Risk Factors include: advanced age (>27men, >37 women);hypertension;dyslipidemia     Objective:    Today's Vitals   04/27/21 1051 04/27/21 1055  BP: 130/72   Pulse: 81   Resp: 16   Temp: 97.9 F (36.6 C)   TempSrc: Temporal   SpO2: 98%   Weight: 119 lb 9.6 oz (54.3 kg)   Height: 5\' 2"  (1.575 m)   PainSc:  4    Body mass index is 21.88 kg/m.  Advanced Directives 04/27/2021 03/07/2020 07/30/2019 07/25/2019 12/12/2018 12/07/2018 09/28/2017  Does Patient Have a Medical Advance Directive? Yes Yes Yes Yes Yes Yes Yes  Type of Paramedic of [place];Living will [place];Living will Living will Living will [place];Living will [place];Living will Living will  Does patient want to make changes to medical advance directive? - No - Patient declined No - Patient declined No - Patient declined No - Patient declined No - Patient declined No - Patient declined  Copy of [place] in Chart? No - copy requested Yes - validated most recent copy scanned in chart (See row information) - - Yes - validated most recent copy scanned in chart (See row information) Yes - validated most recent copy scanned in chart (See row information) -    Current Medications (verified) Outpatient Encounter Medications as of 04/27/2021  Medication Sig   amLODipine (NORVASC) 5 MG tablet Take 1 tablet (5 mg total) by mouth daily.   budesonide (ENTOCORT EC) 3 MG 24 hr capsule Take 3 capsules (9 mg total) by mouth daily.   carbidopa-levodopa (SINEMET IR) 25-100 MG per tablet Take 1 tablet by mouth 4 (four) times daily.    Carbidopa-Levodopa ER (SINEMET CR) 25-100 MG tablet controlled release Take 1 tablet by mouth at bedtime.   cholecalciferol (VITAMIN D3) 25 MCG (1000 UNIT)  tablet Take 1,000 Units by mouth daily.   cholestyramine (QUESTRAN) 4 g packet Take 4 g by mouth as needed.   Coenzyme Q10 (CO Q 10 PO) Take 300 mg by mouth daily in the afternoon.   diazepam (VALIUM) 5 MG tablet TAKE 1 TABLET (5 MG TOTAL) BY MOUTH EVERY 12 (TWELVE) HOURS AS NEEDED. FOR ANXIETY   estradiol (ESTRACE) 0.5 MG tablet TAKE 1/2 TABLET BY MOUTH DAILY   fluticasone (CUTIVATE) 0.05 % cream Apply 1 application topically daily as needed (scalp psoriasis).   golimumab (SIMPONI ARIA) 50 MG/4ML SOLN injection Inject 50 mg into the vein See admin instructions. Given every 2 months - Last given 06/28/2019   hydrochlorothiazide (HYDRODIURIL) 25 MG tablet Take 1.5 tablets (37.5 mg total) by mouth daily.   medroxyPROGESTERone (PROVERA) 2.5 MG tablet TAKE 0.5 TABLETS (1.25 MG TOTAL) BY MOUTH DAILY.   melatonin 5 MG TABS Take 5 mg by mouth at bedtime as needed.   Multiple Vitamins-Minerals (CENTRUM SILVER 50+WOMEN) TABS Take by mouth daily.   Probiotic Product (PROBIOTIC PO) Take by mouth.   sertraline (ZOLOFT) 25 MG tablet Take 1 tablet (25 mg total) by mouth daily.   vitamin C (ASCORBIC ACID) 500 MG tablet Take 500 mg by mouth daily.   diclofenac (VOLTAREN) 50 MG EC tablet Take 50 mg by mouth daily.  (Patient not taking: Reported on 04/27/2021)   [DISCONTINUED] azithromycin (ZITHROMAX) 500 MG tablet azithromycin 500 mg tablet  TAKE 1 TABLET 1 HOUR PRIOR TO DENTAL PROCEDURE (Patient not taking: No sig reported)   Facility-Administered Encounter Medications as of 04/27/2021  Medication   0.9 %  sodium chloride infusion    Allergies (verified) Clindamycin hcl, Codeine, Doxycycline, Hornet venom, Penicillins, Yellow jacket venom, Lodine [etodolac], and Tramadol   History: Past Medical History:  Diagnosis Date   Allergy    Anemia    past hx    Arthropathy of cervical spine 11/27/2012   Right  Xray at [facility]  On 04/08/2016  AP, lateral, and lateral flexion and extension views of the cervical spine  are submitted for evaluation.   No acute fracture identified in the cervical spine.   There is redemonstration of approximately 2 mm of C3 on C4 anterolisthesis in neutral which slightly increases in flexion relative to neutral and extension. Slight C5 on C6 retrolisthesis does not change i   Benign hypertension without congestive heart failure    Benign mole    right hcets mole, bleeds when dried with towel   Bilateral dry eyes    Bladder cancer ([place]) 11/10/2016   Cervical spondylosis    Cervicalgia    2 screws in place    Chronic kidney disease    kidney stones    Depression with anxiety 10-Nov-2016   prior to husbands death    Gross hematuria    developed after first hip replacment, led to indicental finding of bladder tumor, bleeding now resolved    History of kidney stones    1970's   Left hand weakness    due to reinjury of flxor tendon s/p tendon surgery march 12-2018   Lumbar stenosis    Malignant tumor of urinary bladder ([place]) 07/2016   Pre-stage I   OA (osteoarthritis)    Parkinson disease [facility]) dx 2012   neurologist-  dr siddiqui at [place] arthritis [facility])     Dr Trudie Reed , [place] rheum    Rupture of flexor tendon of hand 2020   right   Past Surgical History:  Procedure Laterality Date   BIOPSY MASS LEFT FIRST METACARPAL   06/23/2006   benign   CATARACT EXTRACTION W/ INTRAOCULAR LENS  IMPLANT, BILATERAL  1999 and 2003   CERVICAL FUSION  02/2017   c1-c2 ; reports she has 2 screws 2in long in place done at [facility] ; patient exhibits VERY LIMITED NECK ROM   COLONOSCOPY     CYSTOSCOPY W/ RETROGRADES Bilateral 09/22/2016   Procedure: CYSTOSCOPY WITH RETROGRADE PYELOGRAM;  Surgeon: Alexis Frock, MD;  Location: [facility];  Service: Urology;  Laterality: Bilateral;   D & C HYSTEROSCOPY W/ RESECTION POLYP  07/11/2000   DILATION AND CURETTAGE OF UTERUS  1960's   EXCISION CYST AND DEBRIDEMENT RIGHT WIRST AND REMOVAL FORGEIGN BODY   01/09/2009   FLEXOR TENDON REPAIR Left 12/12/2018   Procedure: REPAIR/TRANSFER FLEXOR DIGITORUM PROFUNDUS OF LEFT SMALL FINGER;  Surgeon: Daryll Brod, MD;  Location: [place];  Service: Orthopedics;  Laterality: Left;   LAPAROSCOPY  yrs ago   infertility    METACARPOPHALANGEAL JOINT ARTHRODESIS Right 05/25/1996   TENDON REPAIR Left 01/15/2019   Hand   TONSILLECTOMY AND ADENOIDECTOMY  child   TOTAL HIP ARTHROPLASTY Left 07/07/2016   Procedure: LEFT TOTAL HIP ARTHROPLASTY ANTERIOR APPROACH;  Surgeon: Gaynelle Arabian, MD;  Location: [facility];  Service: Orthopedics;  Laterality: Left;   TOTAL HIP ARTHROPLASTY Right 09/28/2017   Procedure: RIGHT TOTAL HIP ARTHROPLASTY ANTERIOR APPROACH;  Surgeon: Gaynelle Arabian, MD;  Location: [facility];  Service: Orthopedics;  Laterality: Right;   TOTAL KNEE ARTHROPLASTY Right 07/30/2019   Procedure: TOTAL KNEE ARTHROPLASTY;  Surgeon: Gaynelle Arabian, MD;  Location: [facility];  Service: Orthopedics;  Laterality: Right;  56min   TRANSURETHRAL RESECTION OF BLADDER TUMOR N/A 09/22/2016   Procedure: TRANSURETHRAL RESECTION OF BLADDER TUMOR (TURBT);  Surgeon: Alexis Frock, MD;  Location: [facility];  Service: Urology;  Laterality: N/A;   Family History  Problem Relation Age of Onset   Arthritis Mother    Hypertension Mother    Deep vein thrombosis Mother        recurrent, secondary to Bleeding disorder   Arthritis Father    Stroke Father    Bladder Cancer Father    Diabetes Sister    Heart disease Sister    Obesity Sister    Arthritis Sister    Hypertension Sister    Heart disease Maternal Grandmother    Heart disease Maternal Grandfather    Peripheral vascular disease Paternal Grandmother        s/p leg amputation   Stroke Paternal Grandfather    Esophageal cancer Paternal Aunt    Colon cancer Neg Hx    Colon polyps Neg Hx    Rectal cancer Neg Hx    Stomach cancer Neg Hx    Social History   Socioeconomic History   Marital  status: Widowed    Spouse name: Not on file   Number of children: Not on file   Years of education: Not on file   Highest education level: Not on file  Occupational History   Not on file  Tobacco Use   Smoking status: Former    Years: 10.00    Pack years: 0.00    Types: Cigarettes    Quit date: 05/04/1964    Years since quitting: 57.0   Smokeless tobacco: Never  Vaping Use   Vaping Use: Never used  Substance and Sexual Activity   Alcohol use: Yes    Alcohol/week: 7.0 standard drinks    Types: 7 Glasses of wine per week    Comment: ocassionally- social    Drug use: No   Sexual activity: Not Currently    Partners: Male  Other Topics Concern   Not on file  Social History Narrative   Not on file   Social Determinants of Health   Financial Resource Strain: Low Risk    Difficulty of Paying Living Expenses: Not hard at all  Food Insecurity: No Food Insecurity   Worried About Charity fundraiser in the Last Year: Never true   [place] in the Last Year: Never true  Transportation Needs: No Transportation Needs   Lack of Transportation (Medical): No   Lack of Transportation (Non-Medical): No  Physical Activity: Sufficiently Active   Days of Exercise per Week: 5 days   Minutes of Exercise per Session: 30 min  Stress: No Stress Concern Present   Feeling of Stress : Not at all  Social Connections: Moderately Integrated   Frequency of Communication with Friends and Family: More than three times a week   Frequency of Social Gatherings with Friends and Family: More than three times a week   Attends Religious Services: More than 4 times per year   Active Member of [employer] or Organizations: Yes   Attends Archivist Meetings: More than 4 times per year   Marital Status: Widowed    Tobacco Counseling Counseling given:  Not Answered   Clinical Intake:  Pre-visit preparation completed: Yes  Pain : 0-10 Pain Score: 4  Pain Type: Chronic pain Pain Location:  Generalized (arthritis) Pain Onset: More than a month ago Pain Frequency: Constant     Nutritional Status: BMI of 19-24  Normal Nutritional Risks: None Diabetes: No  How often do you need to have someone help you when you read instructions, pamphlets, or other written materials from your doctor or pharmacy?: 1 - Never  Diabetic?No  Interpreter Needed?: No  Information entered by :: Caroleen Hamman LPn   Activities of Daily Living In your present state of health, do you have any difficulty performing the following activities: 04/27/2021  Hearing? N  Vision? N  Difficulty concentrating or making decisions? N  Walking or climbing stairs? N  Dressing or bathing? N  Doing errands, shopping? N  Preparing Food and eating ? N  Using the Toilet? N  In the past six months, have you accidently leaked urine? N  Do you have problems with loss of bowel control? Y  Comment seeing GI  Managing your Medications? N  Managing your Finances? N  Housekeeping or managing your Housekeeping? N  Some recent data might be hidden    Patient Care Team: Mosie Lukes, MD as PCP - General (Family Medicine) Sueanne Margarita, MD as PCP - Cardiology (Cardiology) Megan Salon, MD as Consulting Physician (Gynecology) Eldridge Abrahams, MD as Referring Physician (Neurology) Alexis Frock, MD as Consulting Physician (Urology) Katy Fitch, Darlina Guys, MD as Consulting Physician (Ophthalmology) Corene Cornea, DC (Chiropractic Medicine) Jari Pigg, MD as Consulting Physician (Dermatology) Day, Melvenia Beam, [facility] (Inactive) as Pharmacist (Pharmacist)  Indicate any recent Medical Services you may have received from other than [facility] providers in the past year (date may be approximate).     Assessment:   This is a routine wellness examination for Stephanie [name].  Hearing/Vision screen Hearing Screening - Comments:: Bilateral hearing aids Vision Screening - Comments:: Last eye exam-11/2020-Dr. Groat  Dietary  issues and exercise activities discussed: Current Exercise Habits: Home exercise routine, Type of exercise: walking;stretching, Time (Minutes): 30, Frequency (Times/Week): 5, Weekly Exercise (Minutes/Week): 150, Intensity: Mild   Goals Addressed             This Visit's Progress    Increase physical activity   Not on track      Depression Screen PHQ 2/9 Scores 04/27/2021 10/01/2020 07/10/2020 03/07/2020 02/14/2020 10/29/2016 06/29/2016  PHQ - 2 Score 0 3 0 0 0 1 6  PHQ- 9 Score - 10 - - 1 - 15    Fall Risk Fall Risk  04/27/2021 03/07/2020 10/03/2018 10/29/2016 07/14/2016  Falls in the past year? 0 0 0 No No  Comment - - Emmi Telephone Survey: data to providers prior to load - Emmi Telephone Survey: data to providers prior to load  Number falls in past yr: 0 0 - - -  Injury with Fall? 0 0 - - -  Follow up Falls prevention discussed Education provided;Falls prevention discussed - - -    FALL RISK PREVENTION PERTAINING TO THE HOME:  Any stairs in or around the home? Yes  If so, are there any without handrails? No  Home free of loose throw rugs in walkways, pet beds, electrical cords, etc? Yes  Adequate lighting in your home to reduce risk of falls? Yes   ASSISTIVE DEVICES UTILIZED TO PREVENT FALLS:  Life alert? No  Use of a cane, walker or w/c? No  Grab bars  in the bathroom? Yes  Shower chair or bench in shower? No  Elevated toilet seat or a handicapped toilet? No   TIMED UP AND GO:  Was the test performed? Yes .  Length of time to ambulate 10 feet: 10 sec.   Gait slow and steady without use of assistive device  Cognitive Function:Normal cognitive status assessed by direct observation by this Nurse Health Advisor. No abnormalities found.   MMSE - Mini Mental State Exam 10/29/2016  Orientation to time 5  Orientation to Place 5  Registration 3  Attention/ Calculation 5  Recall 3  Language- name 2 objects 2  Language- repeat 1  Language- follow 3 step command 3   Language- read & follow direction 1  Write a sentence 1  Copy design 0  Total score 29        Immunizations Immunization History  Administered Date(s) Administered   Influenza Split 06/29/2019   Influenza Whole 08/15/2012   Influenza, High Dose Seasonal PF 08/19/2015, 08/27/2017, 08/15/2018, 07/12/2020   Influenza,inj,quad, With Preservative 08/15/2017, 08/15/2018   Influenza-Unspecified 08/15/2014, 08/24/2016, 08/15/2018   PFIZER(Purple Top)SARS-COV-2 Vaccination 11/25/2019, 12/16/2019, 08/19/2020   Pneumococcal Conjugate-13 12/11/2014   Pneumococcal Polysaccharide-23 11/24/2011   Pneumococcal-Unspecified 11/18/2014   Td 02/25/2014   Zoster Recombinat (Shingrix) 01/20/2018, 04/07/2018   Zoster, Live 08/27/2011    TDAP status: Up to date  Flu Vaccine status: Up to date  Pneumococcal vaccine status: Up to date  Covid-19 vaccine status: Completed vaccines  Qualifies for Shingles Vaccine? No   Zostavax completed Yes   Shingrix Completed?: Yes  Screening Tests Health Maintenance  Topic Date Due   COVID-19 Vaccine (4 - Booster for Pfizer series) 11/19/2020   MAMMOGRAM  04/28/2021   INFLUENZA VACCINE  06/15/2021   TETANUS/TDAP  02/26/2024   DEXA SCAN  Completed   PNA vac Low Risk Adult  Completed   Zoster Vaccines- Shingrix  Completed   HPV VACCINES  Aged Out    Health Maintenance  Health Maintenance Due  Topic Date Due   COVID-19 Vaccine (4 - Booster for [place] series) 11/19/2020    Colorectal cancer screening: No longer required.   Mammogram status: Patient states she has an appt schedule for later this month.   Bone Density status: Due-Declined today. Patient would like to wait & discuss with PCP.  Lung Cancer Screening: (Low Dose CT Chest recommended if Age 33-80 years, 30 pack-year currently smoking OR have quit w/in 15years.) does not qualify.     Additional Screening:  Hepatitis C Screening: does not qualify  Vision Screening: Recommended  annual ophthalmology exams for early detection of glaucoma and other disorders of the eye. Is the patient up to date with their annual eye exam?  Yes  Who is the provider or what is the name of the office in which the patient attends annual eye exams? Dr. Katy Fitch   Dental Screening: Recommended annual dental exams for proper oral hygiene  Community Resource Referral / Chronic Care Management: CRR required this visit?  No   CCM required this visit?  No      Plan:     I have personally reviewed and noted the following in the patient's chart:   Medical and social history Use of alcohol, tobacco or illicit drugs  Current medications and supplements including opioid prescriptions.  Functional ability and status Nutritional status Physical activity Advanced directives List of other physicians Hospitalizations, surgeries, and ER visits in previous 12 months Vitals Screenings to include cognitive, depression, and falls  Referrals and appointments  In addition, I have reviewed and discussed with patient certain preventive protocols, quality metrics, and best practice recommendations. A written personalized care plan for preventive services as well as general preventive health recommendations were provided to patient.  Patient would like to access avs on mychart.   Marta Antu, LPN   11/17/1592  Nurse Health Advisor  Nurse Notes: None

## 2021-04-27 ENCOUNTER — Other Ambulatory Visit: Payer: Self-pay

## 2021-04-27 ENCOUNTER — Ambulatory Visit (INDEPENDENT_AMBULATORY_CARE_PROVIDER_SITE_OTHER): Payer: Medicare Other

## 2021-04-27 VITALS — BP 130/72 | HR 81 | Temp 97.9°F | Resp 16 | Ht 62.0 in | Wt 119.6 lb

## 2021-04-27 DIAGNOSIS — Z0000 Encounter for general adult medical examination without abnormal findings: Secondary | ICD-10-CM | POA: Diagnosis not present

## 2021-04-27 NOTE — Patient Instructions (Signed)
Stephanie [name] , Thank you for taking time to come for your Medicare Wellness Visit. I appreciate your ongoing commitment to your health goals. Please review the following plan we discussed and let me know if I can assist you in the future.   Screening recommendations/referrals: Colonoscopy: No longer required Mammogram: Scheduled for later this month. Bone Density: Due-Declined today. Per our conversation you would like to discuss with Dr. Charlett Blake. Recommended yearly ophthalmology/optometry visit for glaucoma screening and checkup Recommended yearly dental visit for hygiene and checkup  Vaccinations: Influenza vaccine: Up to date Pneumococcal vaccine: Up to date Tdap vaccine: Up to date-Due-02/26/2024 Shingles vaccine: Completed vaccines   Covid-19:Up to date  Advanced directives: Copy in chart  Conditions/risks identified: See problem list  Next appointment: Follow up in one year for your annual wellness visit 04/30/2022 @ 11:00   Preventive Care 85 Years and Older, Female Preventive care refers to lifestyle choices and visits with your health care provider that can promote health and wellness. What does preventive care include? A yearly physical exam. This is also called an annual well check. Dental exams once or twice a year. Routine eye exams. Ask your health care provider how often you should have your eyes checked. Personal lifestyle choices, including: Daily care of your teeth and gums. Regular physical activity. Eating a healthy diet. Avoiding tobacco and drug use. Limiting alcohol use. Practicing safe sex. Taking low-dose aspirin every day. Taking vitamin and mineral supplements as recommended by your health care provider. What happens during an annual well check? The services and screenings done by your health care provider during your annual well check will depend on your age, overall health, lifestyle risk factors, and family history of disease. Counseling  Your health  care provider may ask you questions about your: Alcohol use. Tobacco use. Drug use. Emotional well-being. Home and relationship well-being. Sexual activity. Eating habits. History of falls. Memory and ability to understand (cognition). Work and work Statistician. Reproductive health. Screening  You may have the following tests or measurements: Height, weight, and BMI. Blood pressure. Lipid and cholesterol levels. These may be checked every 5 years, or more frequently if you are over 16 years old. Skin check. Lung cancer screening. You may have this screening every year starting at age 103 if you have a 30-pack-year history of smoking and currently smoke or have quit within the past 15 years. Fecal occult blood test (FOBT) of the stool. You may have this test every year starting at age 75. Flexible sigmoidoscopy or colonoscopy. You may have a sigmoidoscopy every 5 years or a colonoscopy every 10 years starting at age 45. Hepatitis C blood test. Hepatitis B blood test. Sexually transmitted disease (STD) testing. Diabetes screening. This is done by checking your blood sugar (glucose) after you have not eaten for a while (fasting). You may have this done every 1-3 years. Bone density scan. This is done to screen for osteoporosis. You may have this done starting at age 24. Mammogram. This may be done every 1-2 years. Talk to your health care provider about how often you should have regular mammograms. Talk with your health care provider about your test results, treatment options, and if necessary, the need for more tests. Vaccines  Your health care provider may recommend certain vaccines, such as: Influenza vaccine. This is recommended every year. Tetanus, diphtheria, and acellular pertussis (Tdap, Td) vaccine. You may need a Td booster every 10 years. Zoster vaccine. You may need this after age 20. Pneumococcal 13-valent  conjugate (PCV13) vaccine. One dose is recommended after age  23. Pneumococcal polysaccharide (PPSV23) vaccine. One dose is recommended after age 46. Talk to your health care provider about which screenings and vaccines you need and how often you need them. This information is not intended to replace advice given to you by your health care provider. Make sure you discuss any questions you have with your health care provider. Document Released: 11/28/2015 Document Revised: 07/21/2016 Document Reviewed: 09/02/2015 Elsevier Interactive Patient Education  2017 [employer] Prevention in the Home Falls can cause injuries. They can happen to people of all ages. There are many things you can do to make your home safe and to help prevent falls. What can I do on the outside of my home? Regularly fix the edges of walkways and driveways and fix any cracks. Remove anything that might make you trip as you walk through a door, such as a raised step or threshold. Trim any bushes or trees on the path to your home. Use bright outdoor lighting. Clear any walking paths of anything that might make someone trip, such as rocks or tools. Regularly check to see if handrails are loose or broken. Make sure that both sides of any steps have handrails. Any raised decks and porches should have guardrails on the edges. Have any leaves, snow, or ice cleared regularly. Use sand or salt on walking paths during winter. Clean up any spills in your garage right away. This includes oil or grease spills. What can I do in the bathroom? Use night lights. Install grab bars by the toilet and in the tub and shower. Do not use towel bars as grab bars. Use non-skid mats or decals in the tub or shower. If you need to sit down in the shower, use a plastic, non-slip stool. Keep the floor dry. Clean up any water that spills on the floor as soon as it happens. Remove soap buildup in the tub or shower regularly. Attach bath mats securely with double-sided non-slip rug tape. Do not have throw  rugs and other things on the floor that can make you trip. What can I do in the bedroom? Use night lights. Make sure that you have a light by your bed that is easy to reach. Do not use any sheets or blankets that are too big for your bed. They should not hang down onto the floor. Have a firm chair that has side arms. You can use this for support while you get dressed. Do not have throw rugs and other things on the floor that can make you trip. What can I do in the kitchen? Clean up any spills right away. Avoid walking on wet floors. Keep items that you use a lot in easy-to-reach places. If you need to reach something above you, use a strong step stool that has a grab bar. Keep electrical cords out of the way. Do not use floor polish or wax that makes floors slippery. If you must use wax, use non-skid floor wax. Do not have throw rugs and other things on the floor that can make you trip. What can I do with my stairs? Do not leave any items on the stairs. Make sure that there are handrails on both sides of the stairs and use them. Fix handrails that are broken or loose. Make sure that handrails are as long as the stairways. Check any carpeting to make sure that it is firmly attached to the stairs. Fix any carpet that  is loose or worn. Avoid having throw rugs at the top or bottom of the stairs. If you do have throw rugs, attach them to the floor with carpet tape. Make sure that you have a light switch at the top of the stairs and the bottom of the stairs. If you do not have them, ask someone to add them for you. What else can I do to help prevent falls? Wear shoes that: Do not have high heels. Have rubber bottoms. Are comfortable and fit you well. Are closed at the toe. Do not wear sandals. If you use a stepladder: Make sure that it is fully opened. Do not climb a closed stepladder. Make sure that both sides of the stepladder are locked into place. Ask someone to hold it for you, if  possible. Clearly mark and make sure that you can see: Any grab bars or handrails. First and last steps. Where the edge of each step is. Use tools that help you move around (mobility aids) if they are needed. These include: Canes. Walkers. Scooters. Crutches. Turn on the lights when you go into a dark area. Replace any light bulbs as soon as they burn out. Set up your furniture so you have a clear path. Avoid moving your furniture around. If any of your floors are uneven, fix them. If there are any pets around you, be aware of where they are. Review your medicines with your doctor. Some medicines can make you feel dizzy. This can increase your chance of falling. Ask your doctor what other things that you can do to help prevent falls. This information is not intended to replace advice given to you by your health care provider. Make sure you discuss any questions you have with your health care provider. Document Released: 08/28/2009 Document Revised: 04/08/2016 Document Reviewed: 12/06/2014 Elsevier Interactive Patient Education  2017 [employer].

## 2021-05-04 ENCOUNTER — Telehealth: Payer: Self-pay | Admitting: Gastroenterology

## 2021-05-04 NOTE — Telephone Encounter (Signed)
Spoke to patient who wanted to inform Dr Lyndel Safe that she will be having a tooth implant and the oral surgeon may start her on a course of antibiotics. She has not had diarrhea for several months and was concerned that this may develop. Patient was advised to be treated for her dental implant as indicated by her surgeon and to contact our office if she develops any GI symptoms. Patient voiced understanding.

## 2021-05-04 NOTE — Telephone Encounter (Signed)
Pt states that she will have a tooth implant soon so she wants to know if it is safe for her to take antibiotics and for how long. She stated that in the past she has developed diarrhea after taking them.

## 2021-05-06 ENCOUNTER — Telehealth: Payer: Self-pay | Admitting: *Deleted

## 2021-05-06 DIAGNOSIS — I10 Essential (primary) hypertension: Secondary | ICD-10-CM

## 2021-05-06 DIAGNOSIS — Z1231 Encounter for screening mammogram for malignant neoplasm of breast: Secondary | ICD-10-CM | POA: Diagnosis not present

## 2021-05-06 DIAGNOSIS — Z780 Asymptomatic menopausal state: Secondary | ICD-10-CM | POA: Diagnosis not present

## 2021-05-06 DIAGNOSIS — E7800 Pure hypercholesterolemia, unspecified: Secondary | ICD-10-CM

## 2021-05-06 LAB — HM DEXA SCAN: HM Dexa Scan: NORMAL

## 2021-05-06 LAB — HM MAMMOGRAPHY

## 2021-05-06 NOTE — Telephone Encounter (Signed)
Pt has lab appt tomorrow. I do not see any future orders in Epic.  Please place future orders if appropriate or call pt to cancel appt if labs are not needed at this time.

## 2021-05-06 NOTE — Telephone Encounter (Signed)
Left message for pt to return my call.

## 2021-05-07 ENCOUNTER — Other Ambulatory Visit: Payer: Self-pay

## 2021-05-07 ENCOUNTER — Other Ambulatory Visit (INDEPENDENT_AMBULATORY_CARE_PROVIDER_SITE_OTHER): Payer: Medicare Other

## 2021-05-07 ENCOUNTER — Encounter: Payer: Self-pay | Admitting: *Deleted

## 2021-05-07 DIAGNOSIS — E7800 Pure hypercholesterolemia, unspecified: Secondary | ICD-10-CM

## 2021-05-07 DIAGNOSIS — I10 Essential (primary) hypertension: Secondary | ICD-10-CM | POA: Diagnosis not present

## 2021-05-07 LAB — COMPREHENSIVE METABOLIC PANEL WITH GFR
ALT: 6 U/L (ref 0–35)
AST: 23 U/L (ref 0–37)
Albumin: 4.8 g/dL (ref 3.5–5.2)
Alkaline Phosphatase: 42 U/L (ref 39–117)
BUN: 15 mg/dL (ref 6–23)
CO2: 31 meq/L (ref 19–32)
Calcium: 9.7 mg/dL (ref 8.4–10.5)
Chloride: 96 meq/L (ref 96–112)
Creatinine, Ser: 0.83 mg/dL (ref 0.40–1.20)
GFR: 63.43 mL/min (ref >60.00)
Glucose, Bld: 85 mg/dL (ref 70–99)
Potassium: 3.7 meq/L (ref 3.5–5.1)
Sodium: 135 meq/L (ref 135–145)
Total Bilirubin: 1.2 mg/dL (ref 0.2–1.2)
Total Protein: 6.6 g/dL (ref 6.0–8.3)

## 2021-05-07 LAB — CBC
HCT: 40.4 % (ref 36.0–46.0)
Hemoglobin: 13.7 g/dL (ref 12.0–15.0)
MCHC: 33.9 g/dL (ref 30.0–36.0)
MCV: 91.9 fl (ref 78.0–100.0)
Platelets: 194 K/uL (ref 150.0–400.0)
RBC: 4.39 Mil/uL (ref 3.87–5.11)
RDW: 14.6 % (ref 11.5–15.5)
WBC: 6.2 K/uL (ref 4.0–10.5)

## 2021-05-07 LAB — LIPID PANEL
Cholesterol: 175 mg/dL (ref 0–200)
HDL: 78.1 mg/dL (ref >39.00)
LDL Cholesterol: 79 mg/dL (ref 0–99)
NonHDL: 96.45
Total CHOL/HDL Ratio: 2
Triglycerides: 86 mg/dL (ref 0.0–149.0)
VLDL: 17.2 mg/dL (ref 0.0–40.0)

## 2021-05-07 LAB — TSH: TSH: 1.55 u[IU]/mL (ref 0.35–4.50)

## 2021-05-07 NOTE — Telephone Encounter (Signed)
Notified pt of below. She states she has her routine follow up with Dr Charlett Blake next week and is due for labs prior to that appt.  Pt also states that when she called for her refill in February she was told she was delinquent in her follow up with her PCP but PCP's schedule did not allow her to schedule f/u until the end of June. Advised her we can repeat the labs she had in March per below recommendation and to just keep her lab appt as scheduled today.  Apologized to pt for any miscommunication with our office. Future orders placed for comp, lipid, cbc and tsh.

## 2021-05-11 ENCOUNTER — Encounter: Payer: Self-pay | Admitting: *Deleted

## 2021-05-14 ENCOUNTER — Ambulatory Visit (INDEPENDENT_AMBULATORY_CARE_PROVIDER_SITE_OTHER): Payer: Medicare Other | Admitting: Family Medicine

## 2021-05-14 ENCOUNTER — Other Ambulatory Visit: Payer: Self-pay

## 2021-05-14 DIAGNOSIS — R197 Diarrhea, unspecified: Secondary | ICD-10-CM

## 2021-05-14 DIAGNOSIS — E559 Vitamin D deficiency, unspecified: Secondary | ICD-10-CM | POA: Diagnosis not present

## 2021-05-14 DIAGNOSIS — U071 COVID-19: Secondary | ICD-10-CM | POA: Diagnosis not present

## 2021-05-14 DIAGNOSIS — E7800 Pure hypercholesterolemia, unspecified: Secondary | ICD-10-CM | POA: Diagnosis not present

## 2021-05-14 DIAGNOSIS — M25561 Pain in right knee: Secondary | ICD-10-CM

## 2021-05-14 DIAGNOSIS — I10 Essential (primary) hypertension: Secondary | ICD-10-CM

## 2021-05-14 DIAGNOSIS — G8929 Other chronic pain: Secondary | ICD-10-CM

## 2021-05-14 NOTE — Progress Notes (Signed)
Patient ID: Stephanie [name], female    DOB: [birth year]  Age: 87 y.o. MRN: [id number]    Subjective:  Subjective  HPI NYELLE WOLFSON presents for office visit today for follow up on arthritis  and post right knee surgery. She states that she is doing ok and has no recent hospitalization or recent ER visits to report. She denies CP/palp/SOB/HA/congestion/fevers/GI or GU c/o. Taking meds as prescribed. She reports that her diarrhea has stopped. She states that her right knee replacement surgery has went well and has gained function back and is able to participate in more physically demanding activities. She reports that her arthritis in hand joints has gotten worse due to stopping diclofenac. She reports that she has had a COVID-19 infection back in December.   Review of Systems  Constitutional:  Negative for chills, fatigue and fever.  HENT:  Negative for congestion, rhinorrhea, sinus pressure, sinus pain and sore throat.   Eyes:  Negative for pain.  Respiratory:  Negative for cough and shortness of breath.   Cardiovascular:  Negative for chest pain, palpitations and leg swelling.  Gastrointestinal:  Negative for abdominal pain, blood in stool, diarrhea, nausea and vomiting.  Genitourinary:  Negative for decreased urine volume, dysuria, flank pain, frequency, vaginal bleeding and vaginal discharge.  Musculoskeletal:  Positive for arthralgias and joint swelling. Negative for back pain.  Neurological:  Negative for headaches.   History Past Medical History:  Diagnosis Date   Allergy    Anemia    past hx    Arthropathy of cervical spine 11/27/2012   Right  Xray at [facility]  On 04/08/2016  AP, lateral, and lateral flexion and extension views of the cervical spine are submitted for evaluation.   No acute fracture identified in the cervical spine.   There is redemonstration of approximately 2 mm of C3 on C4 anterolisthesis in neutral which slightly increases in flexion relative to neutral and extension.  Slight C5 on C6 retrolisthesis does not change i   Benign hypertension without congestive heart failure    Benign mole    right hcets mole, bleeds when dried with towel   Bilateral dry eyes    Bladder cancer (HCC) 2016/11/04   Cervical spondylosis    Cervicalgia    2 screws in place    Chronic kidney disease    kidney stones    Depression with anxiety 04-Nov-2016   prior to husbands death    Gross hematuria    developed after first hip replacment, led to indicental finding of bladder tumor, bleeding now resolved    History of kidney stones    1970's   Left hand weakness    due to reinjury of flxor tendon s/p tendon surgery march 12-2018   Lumbar stenosis    Malignant tumor of urinary bladder ([place]) 07/2016   Pre-stage I   OA (osteoarthritis)    Parkinson disease [facility]) dx 2012   neurologist-  dr siddiqui at [place] [facility])     Dr Trudie Reed , [facility]    Rupture of flexor tendon of hand 2020   right    She has a past surgical history that includes Total hip arthroplasty (Left, 07/07/2016); laparoscopy (yrs ago); D & C HYSTEROSCOPY W/ RESECTION POLYP (07/11/2000); BIOPSY MASS LEFT FIRST METACARPAL  (06/23/2006); EXCISION CYST AND DEBRIDEMENT RIGHT WIRST AND REMOVAL FORGEIGN BODY (01/09/2009); Cataract extraction w/ intraocular lens  implant, bilateral (1999 and 2003); Metacarpophalangeal joint arthrodesis (Right, 05/25/1996); Dilation and curettage of  uterus (1960's); Tonsillectomy and adenoidectomy (child); Transurethral resection of bladder tumor (N/A, 09/22/2016); Cystoscopy w/ retrogrades (Bilateral, 09/22/2016); Cervical fusion (02/2017); Total hip arthroplasty (Right, 09/28/2017); Flexor tendon repair (Left, 12/12/2018); Tendon repair (Left, 01/15/2019); Total knee arthroplasty (Right, 07/30/2019); and Colonoscopy.   Her family history includes Arthritis in her father, mother, and sister; Bladder Cancer in her father; Deep vein thrombosis in her mother; Diabetes in  her sister; Esophageal cancer in her paternal aunt; Heart disease in her maternal grandfather, maternal grandmother, and sister; Hypertension in her mother and sister; Obesity in her sister; Peripheral vascular disease in her paternal grandmother; Stroke in her father and paternal grandfather.She reports that she quit smoking about 57 years ago. She has never used smokeless tobacco. She reports current alcohol use of about 7.0 standard drinks of alcohol per week. She reports that she does not use drugs.  Current Outpatient Medications on File Prior to Visit  Medication Sig Dispense Refill   amLODipine (NORVASC) 5 MG tablet Take 1 tablet (5 mg total) by mouth daily. 90 tablet 3   budesonide (ENTOCORT EC) 3 MG 24 hr capsule Take 3 capsules (9 mg total) by mouth daily. 180 capsule 3   carbidopa-levodopa (SINEMET IR) 25-100 MG per tablet Take 1 tablet by mouth 4 (four) times daily.      Carbidopa-Levodopa ER (SINEMET CR) 25-100 MG tablet controlled release Take 1 tablet by mouth at bedtime.     cholecalciferol (VITAMIN D3) 25 MCG (1000 UNIT) tablet Take 1,000 Units by mouth daily.     cholestyramine (QUESTRAN) 4 g packet Take 4 g by mouth as needed.     diazepam (VALIUM) 5 MG tablet TAKE 1 TABLET (5 MG TOTAL) BY MOUTH EVERY 12 (TWELVE) HOURS AS NEEDED. FOR ANXIETY 60 tablet 0   estradiol (ESTRACE) 0.5 MG tablet TAKE 1/2 TABLET BY MOUTH DAILY 45 tablet 1   fluticasone (CUTIVATE) 0.05 % cream Apply 1 application topically daily as needed (scalp psoriasis).  1   golimumab (SIMPONI ARIA) 50 MG/4ML SOLN injection Inject 50 mg into the vein See admin instructions. Given every 2 months - Last given 06/28/2019     hydrochlorothiazide (HYDRODIURIL) 25 MG tablet Take 1.5 tablets (37.5 mg total) by mouth daily. 135 tablet 1   medroxyPROGESTERone (PROVERA) 2.5 MG tablet TAKE 0.5 TABLETS (1.25 MG TOTAL) BY MOUTH DAILY. 45 tablet 3   melatonin 5 MG TABS Take 5 mg by mouth at bedtime as needed.     Multiple  Vitamins-Minerals (CENTRUM SILVER 50+WOMEN) TABS Take by mouth daily.     Probiotic Product (PROBIOTIC PO) Take by mouth.     sertraline (ZOLOFT) 25 MG tablet Take 1 tablet (25 mg total) by mouth daily. 90 tablet 1   vitamin C (ASCORBIC ACID) 500 MG tablet Take 500 mg by mouth daily.     Coenzyme Q10 (CO Q 10 PO) Take 300 mg by mouth daily in the afternoon.     diclofenac (VOLTAREN) 50 MG EC tablet Take 50 mg by mouth daily.  (Patient not taking: No sig reported)     Current Facility-Administered Medications on File Prior to Visit  Medication Dose Route Frequency Provider Last Rate Last Admin   0.9 %  sodium chloride infusion  500 mL Intravenous Once Jackquline Denmark, MD         Objective:  Objective  Physical Exam Constitutional:      General: She is not in acute distress.    Appearance: Normal appearance. She is not ill-appearing or toxic-appearing.  HENT:     Head: Normocephalic and atraumatic.     Right Ear: Tympanic membrane, ear canal and external ear normal.     Left Ear: Tympanic membrane, ear canal and external ear normal.     Nose: No congestion or rhinorrhea.  Eyes:     Extraocular Movements: Extraocular movements intact.     Pupils: Pupils are equal, round, and reactive to light.  Cardiovascular:     Rate and Rhythm: Normal rate and regular rhythm.     Pulses: Normal pulses.     Heart sounds: Normal heart sounds. No murmur heard. Pulmonary:     Effort: Pulmonary effort is normal. No respiratory distress.     Breath sounds: Normal breath sounds. No wheezing, rhonchi or rales.  Abdominal:     General: Bowel sounds are normal.     Palpations: Abdomen is soft. There is no mass.     Tenderness: no abdominal tenderness There is no guarding.     Hernia: No hernia is present.  Musculoskeletal:        General: Normal range of motion.     Cervical back: Normal range of motion and neck supple.  Skin:    General: Skin is warm and dry.  Neurological:     Mental Status: She is  alert and oriented to person, place, and time.  Psychiatric:        Behavior: Behavior normal.   BP 124/62   Pulse 93   Temp 98 F (36.7 C)   Resp 16   Wt 122 lb 9.6 oz (55.6 kg)   LMP 01/13/2013 Comment: spotting-had benign endometrial biopsy   SpO2 98%   BMI 22.42 kg/m  Wt Readings from Last 3 Encounters:  05/14/21 122 lb 9.6 oz (55.6 kg)  04/27/21 119 lb 9.6 oz (54.3 kg)  04/09/21 120 lb (54.4 kg)     Lab Results  Component Value Date   WBC 6.2 05/07/2021   HGB 13.7 05/07/2021   HCT 40.4 05/07/2021   PLT 194.0 05/07/2021   GLUCOSE 85 05/07/2021   CHOL 175 05/07/2021   TRIG 86.0 05/07/2021   HDL 78.10 05/07/2021   LDLDIRECT 120.0 12/11/2014   LDLCALC 79 05/07/2021   ALT 6 05/07/2021   AST 23 05/07/2021   NA 135 05/07/2021   K 3.7 05/07/2021   CL 96 05/07/2021   CREATININE 0.83 05/07/2021   BUN 15 05/07/2021   CO2 31 05/07/2021   TSH 1.55 05/07/2021   INR 1.0 07/25/2019   HGBA1C 5.1 02/14/2020    No results found.   Assessment & Plan:  Plan    No orders of the defined types were placed in this encounter.   Problem List Items Addressed This Visit     HTN (hypertension)    Well controlled, no changes to meds. Encouraged heart healthy diet such as the DASH diet and exercise as tolerated.        Right knee pain    Much better since her TKR she is pleased with her improvement in mobility       Hyperlipidemia    Encourage heart healthy diet such as MIND or DASH diet, increase exercise, avoid trans fats, simple carbohydrates and processed foods, consider a krill or fish or flaxseed oil cap daily.        Diarrhea    On  Budesonide for the diarrhea       Vitamin D deficiency    Labs reveal deficiency. Start on Vitamin D 50000 IU  caps, 1 cap po weekly x 12 weeks. Disp #4 with 4 rf. Also take daily Vitamin D over the counter. If already taking a daily supplement increase by 1000 IU daily and if not start Vitamin D 2000 IU daily.        COVID-19     She had a covid infection back in December but she feels she has recovered fully now        Follow-up: Return in about 4 months (around 09/21/2021).  I, Suezanne Jacquet, acting as a scribe for Penni Homans, MD, have documented all relevent documentation on behalf of Penni Homans, MD, as directed by Penni Homans, MD while in the presence of Penni Homans, MD.  I, Mosie Lukes, MD personally performed the services described in this documentation. All medical record entries made by the scribe were at my direction and in my presence. I have reviewed the chart and agree that the record reflects my personal performance and is accurate and complete

## 2021-05-14 NOTE — Patient Instructions (Addendum)
Next covid shot any time   Hypertension, Adult Hypertension is another name for high blood pressure. High blood pressure forces your heart to work harder to pump blood. This can cause problems overtime. There are two numbers in a blood pressure reading. There is a top number (systolic) over a bottom number (diastolic). It is best to have a blood pressure that is below 120/80. Healthy choicescan help lower your blood pressure, or you may need medicine to help lower it. What are the causes? The cause of this condition is not known. Some conditions may be related tohigh blood pressure. What increases the risk? Smoking. Having type 2 diabetes mellitus, high cholesterol, or both. Not getting enough exercise or physical activity. Being overweight. Having too much fat, sugar, calories, or salt (sodium) in your diet. Drinking too much alcohol. Having long-term (chronic) kidney disease. Having a family history of high blood pressure. Age. Risk increases with age. Race. You may be at higher risk if you are African American. Gender. Men are at higher risk than women before age 36. After age 73, women are at higher risk than men. Having obstructive sleep apnea. Stress. What are the signs or symptoms? High blood pressure may not cause symptoms. Very high blood pressure (hypertensive crisis) may cause: Headache. Feelings of worry or nervousness (anxiety). Shortness of breath. Nosebleed. A feeling of being sick to your stomach (nausea). Throwing up (vomiting). Changes in how you see. Very bad chest pain. Seizures. How is this treated? This condition is treated by making healthy lifestyle changes, such as: Eating healthy foods. Exercising more. Drinking less alcohol. Your health care provider may prescribe medicine if lifestyle changes are not enough to get your blood pressure under control, and if: Your top number is above 130. Your bottom number is above 80. Your personal target blood  pressure may vary. Follow these instructions at home: Eating and drinking  If told, follow the DASH eating plan. To follow this plan: Fill one half of your plate at each meal with fruits and vegetables. Fill one fourth of your plate at each meal with whole grains. Whole grains include whole-wheat pasta, brown rice, and whole-grain bread. Eat or drink low-fat dairy products, such as skim milk or low-fat yogurt. Fill one fourth of your plate at each meal with low-fat (lean) proteins. Low-fat proteins include fish, chicken without skin, eggs, beans, and tofu. Avoid fatty meat, cured and processed meat, or chicken with skin. Avoid pre-made or processed food. Eat less than 1,500 mg of salt each day. Do not drink alcohol if: Your doctor tells you not to drink. You are pregnant, may be pregnant, or are planning to become pregnant. If you drink alcohol: Limit how much you use to: 0-1 drink a day for women. 0-2 drinks a day for men. Be aware of how much alcohol is in your drink. In the [place] one drink equals one 12 oz bottle of beer (355 mL), one 5 oz glass of wine (148 mL), or one 1 oz glass of hard liquor (44 mL).  Lifestyle  Work with your doctor to stay at a healthy weight or to lose weight. Ask your doctor what the best weight is for you. Get at least 30 minutes of exercise most days of the week. This may include walking, swimming, or biking. Get at least 30 minutes of exercise that strengthens your muscles (resistance exercise) at least 3 days a week. This may include lifting weights or doing Pilates. Do not use any products that  contain nicotine or tobacco, such as cigarettes, e-cigarettes, and chewing tobacco. If you need help quitting, ask your doctor. Check your blood pressure at home as told by your doctor. Keep all follow-up visits as told by your doctor. This is important.  Medicines Take over-the-counter and prescription medicines only as told by your doctor. Follow directions  carefully. Do not skip doses of blood pressure medicine. The medicine does not work as well if you skip doses. Skipping doses also puts you at risk for problems. Ask your doctor about side effects or reactions to medicines that you should watch for. Contact a doctor if you: Think you are having a reaction to the medicine you are taking. Have headaches that keep coming back (recurring). Feel dizzy. Have swelling in your ankles. Have trouble with your vision. Get help right away if you: Get a very bad headache. Start to feel mixed up (confused). Feel weak or numb. Feel faint. Have very bad pain in your: Chest. Belly (abdomen). Throw up more than once. Have trouble breathing. Summary Hypertension is another name for high blood pressure. High blood pressure forces your heart to work harder to pump blood. For most people, a normal blood pressure is less than 120/80. Making healthy choices can help lower blood pressure. If your blood pressure does not get lower with healthy choices, you may need to take medicine. This information is not intended to replace advice given to you by your health care provider. Make sure you discuss any questions you have with your healthcare provider. Document Revised: 07/12/2018 Document Reviewed: 07/12/2018 Elsevier Patient Education  [place].

## 2021-05-15 DIAGNOSIS — U071 COVID-19: Secondary | ICD-10-CM | POA: Diagnosis not present

## 2021-05-15 DIAGNOSIS — Z23 Encounter for immunization: Secondary | ICD-10-CM | POA: Diagnosis not present

## 2021-05-18 NOTE — Assessment & Plan Note (Signed)
Encourage heart healthy diet such as MIND or DASH diet, increase exercise, avoid trans fats, simple carbohydrates and processed foods, consider a krill or fish or flaxseed oil cap daily.  °

## 2021-05-18 NOTE — Assessment & Plan Note (Signed)
On  Budesonide for the diarrhea

## 2021-05-18 NOTE — Assessment & Plan Note (Signed)
Much better since her TKR she is pleased with her improvement in mobility

## 2021-05-18 NOTE — Assessment & Plan Note (Signed)
She had a covid infection back in December but she feels she has recovered fully now

## 2021-05-18 NOTE — Assessment & Plan Note (Signed)
Labs reveal deficiency. Start on Vitamin D 50000 IU caps, 1 cap po weekly x 12 weeks. Disp #4 with 4 rf. Also take daily Vitamin D over the counter. If already taking a daily supplement increase by 1000 IU daily and if not start Vitamin D 2000 IU daily.  

## 2021-05-18 NOTE — Assessment & Plan Note (Signed)
Well controlled, no changes to meds. Encouraged heart healthy diet such as the DASH diet and exercise as tolerated.  °

## 2021-05-19 ENCOUNTER — Telehealth: Payer: Self-pay

## 2021-05-19 NOTE — Telephone Encounter (Signed)
Called pt to go over dexa scan results.  Results are normal.

## 2021-05-20 NOTE — Telephone Encounter (Signed)
Pt aware.

## 2021-05-23 ENCOUNTER — Other Ambulatory Visit: Payer: Self-pay | Admitting: Family Medicine

## 2021-05-28 DIAGNOSIS — L4050 Arthropathic psoriasis, unspecified: Secondary | ICD-10-CM | POA: Diagnosis not present

## 2021-06-04 DIAGNOSIS — Z79899 Other long term (current) drug therapy: Secondary | ICD-10-CM | POA: Diagnosis not present

## 2021-06-04 DIAGNOSIS — Z6822 Body mass index (BMI) 22.0-22.9, adult: Secondary | ICD-10-CM | POA: Diagnosis not present

## 2021-06-04 DIAGNOSIS — L409 Psoriasis, unspecified: Secondary | ICD-10-CM | POA: Diagnosis not present

## 2021-06-04 DIAGNOSIS — M2550 Pain in unspecified joint: Secondary | ICD-10-CM | POA: Diagnosis not present

## 2021-06-04 DIAGNOSIS — M150 Primary generalized (osteo)arthritis: Secondary | ICD-10-CM | POA: Diagnosis not present

## 2021-06-04 DIAGNOSIS — L4050 Arthropathic psoriasis, unspecified: Secondary | ICD-10-CM | POA: Diagnosis not present

## 2021-06-04 DIAGNOSIS — K5289 Other specified noninfective gastroenteritis and colitis: Secondary | ICD-10-CM | POA: Diagnosis not present

## 2021-06-15 ENCOUNTER — Telehealth: Payer: Self-pay | Admitting: Gastroenterology

## 2021-06-15 NOTE — Telephone Encounter (Signed)
Poke with patient, patient states budesonide is improving her sx. Patient wanted to know if she should continue. Patient advised as to last telephone encounter:  04/17/21 Start budesonide '9mg'$  po qd x 8 weeks, then if better, '6mg'$  for 2 weeks followed by 3 mg for another 2 weeks. If not better, please call  Patient states she has 6 refills left for budesonide.  Last OV:02/12/21  Patient also concerned with the proper diet she should be on to avoid further complications.  Please advise, thank you.

## 2021-06-15 NOTE — Telephone Encounter (Signed)
Please advise on this.  

## 2021-06-16 NOTE — Telephone Encounter (Signed)
Attempted to contact patient, no answer LMTCB 

## 2021-06-16 NOTE — Telephone Encounter (Signed)
Can eat anything she wants Avoid raw vegetables for now Avoid nonsteroidals She does need to continue budesonide as above. RG

## 2021-06-16 NOTE — Telephone Encounter (Signed)
Spoke with patient, patient made aware of MD's rec's. Patient verbalized understanding, nothing further needed at this time.

## 2021-06-25 ENCOUNTER — Encounter: Payer: Self-pay | Admitting: Family Medicine

## 2021-06-25 ENCOUNTER — Other Ambulatory Visit: Payer: Self-pay | Admitting: Family Medicine

## 2021-06-25 MED ORDER — DIAZEPAM 5 MG PO TABS: ORAL_TABLET | ORAL | 1 refills | Status: DC

## 2021-07-13 ENCOUNTER — Telehealth: Payer: Self-pay | Admitting: Gastroenterology

## 2021-07-13 NOTE — Telephone Encounter (Signed)
Inbound call from patient requesting a call please.  States today will be the last day of taking budesonide medication and wants to know if she can start taking diclofenac again.  Please advise.

## 2021-07-13 NOTE — Telephone Encounter (Signed)
Spoke with patient. Patient states she has completed her Budesonide taper. Patient would like to know can she go back ondiclofenac  Patient states diarrhea has gone away, however is having arthritic pain.Please advise, thank you

## 2021-07-14 DIAGNOSIS — Z79899 Other long term (current) drug therapy: Secondary | ICD-10-CM | POA: Diagnosis not present

## 2021-07-14 DIAGNOSIS — Z885 Allergy status to narcotic agent status: Secondary | ICD-10-CM | POA: Diagnosis not present

## 2021-07-14 DIAGNOSIS — Z881 Allergy status to other antibiotic agents status: Secondary | ICD-10-CM | POA: Diagnosis not present

## 2021-07-14 DIAGNOSIS — G20 Parkinson's disease: Secondary | ICD-10-CM | POA: Diagnosis not present

## 2021-07-14 DIAGNOSIS — Z888 Allergy status to other drugs, medicaments and biological substances status: Secondary | ICD-10-CM | POA: Diagnosis not present

## 2021-07-14 DIAGNOSIS — Z880 Allergy status to penicillin: Secondary | ICD-10-CM | POA: Diagnosis not present

## 2021-07-14 NOTE — Telephone Encounter (Signed)
Patient is requesting a call back.

## 2021-07-14 NOTE — Telephone Encounter (Signed)
I do not see much of a choice She can Please use it sparingly RG

## 2021-07-14 NOTE — Telephone Encounter (Signed)
Spoke with patient advised of rec per MD. Pt verbalized understanding and nothing further needed at this time.

## 2021-07-14 NOTE — Telephone Encounter (Signed)
Attempted to contact pt. No answer Left VM/

## 2021-07-15 NOTE — Telephone Encounter (Signed)
Spoke with patient, patient wanted clarification of rec's from MD. I have spoken with her more to help clarify. Patient verbalized understand. Nothing further at this time

## 2021-07-23 DIAGNOSIS — L4050 Arthropathic psoriasis, unspecified: Secondary | ICD-10-CM | POA: Diagnosis not present

## 2021-07-23 DIAGNOSIS — R5383 Other fatigue: Secondary | ICD-10-CM | POA: Diagnosis not present

## 2021-07-23 DIAGNOSIS — Z111 Encounter for screening for respiratory tuberculosis: Secondary | ICD-10-CM | POA: Diagnosis not present

## 2021-07-23 DIAGNOSIS — Z79899 Other long term (current) drug therapy: Secondary | ICD-10-CM | POA: Diagnosis not present

## 2021-07-29 DIAGNOSIS — Z23 Encounter for immunization: Secondary | ICD-10-CM | POA: Diagnosis not present

## 2021-08-03 ENCOUNTER — Other Ambulatory Visit: Payer: Self-pay | Admitting: Family Medicine

## 2021-08-03 ENCOUNTER — Encounter: Payer: Self-pay | Admitting: Family Medicine

## 2021-08-03 DIAGNOSIS — L84 Corns and callosities: Secondary | ICD-10-CM | POA: Diagnosis not present

## 2021-08-03 DIAGNOSIS — B079 Viral wart, unspecified: Secondary | ICD-10-CM | POA: Diagnosis not present

## 2021-08-03 DIAGNOSIS — I739 Peripheral vascular disease, unspecified: Secondary | ICD-10-CM | POA: Diagnosis not present

## 2021-08-03 DIAGNOSIS — L603 Nail dystrophy: Secondary | ICD-10-CM | POA: Diagnosis not present

## 2021-08-03 MED ORDER — DICLOFENAC SODIUM 50 MG PO TBEC: 50.0000 mg | DELAYED_RELEASE_TABLET | Freq: Every day | ORAL | 3 refills | Status: DC

## 2021-08-17 DIAGNOSIS — B079 Viral wart, unspecified: Secondary | ICD-10-CM | POA: Diagnosis not present

## 2021-08-24 MED ORDER — HYDROCHLOROTHIAZIDE 25 MG PO TABS: 37.5000 mg | ORAL_TABLET | Freq: Every day | ORAL | 1 refills | Status: DC

## 2021-08-31 DIAGNOSIS — B079 Viral wart, unspecified: Secondary | ICD-10-CM | POA: Diagnosis not present

## 2021-09-14 ENCOUNTER — Encounter: Payer: Self-pay | Admitting: Family Medicine

## 2021-09-14 ENCOUNTER — Ambulatory Visit (HOSPITAL_BASED_OUTPATIENT_CLINIC_OR_DEPARTMENT_OTHER)
Admission: RE | Admit: 2021-09-14 | Discharge: 2021-09-14 | Disposition: A | Discharge status: Home / Self Care | Payer: Medicare Other | Source: Ambulatory Visit | Attending: Family Medicine | Admitting: Family Medicine

## 2021-09-14 ENCOUNTER — Ambulatory Visit (INDEPENDENT_AMBULATORY_CARE_PROVIDER_SITE_OTHER): Payer: Medicare Other | Admitting: Family Medicine

## 2021-09-14 ENCOUNTER — Other Ambulatory Visit: Payer: Self-pay

## 2021-09-14 VITALS — BP 115/75 | HR 85 | Temp 98.0°F | Resp 12 | Ht 62.0 in | Wt 123.0 lb

## 2021-09-14 DIAGNOSIS — E7800 Pure hypercholesterolemia, unspecified: Secondary | ICD-10-CM

## 2021-09-14 DIAGNOSIS — F418 Other specified anxiety disorders: Secondary | ICD-10-CM

## 2021-09-14 DIAGNOSIS — M25512 Pain in left shoulder: Secondary | ICD-10-CM | POA: Insufficient documentation

## 2021-09-14 DIAGNOSIS — M19012 Primary osteoarthritis, left shoulder: Secondary | ICD-10-CM | POA: Diagnosis not present

## 2021-09-14 DIAGNOSIS — E559 Vitamin D deficiency, unspecified: Secondary | ICD-10-CM

## 2021-09-14 DIAGNOSIS — G20 Parkinson's disease: Secondary | ICD-10-CM | POA: Diagnosis not present

## 2021-09-14 DIAGNOSIS — I10 Essential (primary) hypertension: Secondary | ICD-10-CM

## 2021-09-14 DIAGNOSIS — L4050 Arthropathic psoriasis, unspecified: Secondary | ICD-10-CM | POA: Diagnosis not present

## 2021-09-14 DIAGNOSIS — R197 Diarrhea, unspecified: Secondary | ICD-10-CM

## 2021-09-14 DIAGNOSIS — M545 Low back pain, unspecified: Secondary | ICD-10-CM

## 2021-09-14 LAB — CBC
HCT: 41.9 % (ref 36.0–46.0)
Hemoglobin: 14 g/dL (ref 12.0–15.0)
MCHC: 33.3 g/dL (ref 30.0–36.0)
MCV: 91.6 fl (ref 78.0–100.0)
Platelets: 198 10*3/uL (ref 150.0–400.0)
RBC: 4.58 Mil/uL (ref 3.87–5.11)
RDW: 13 % (ref 11.5–15.5)
WBC: 6.2 10*3/uL (ref 4.0–10.5)

## 2021-09-14 LAB — LIPID PANEL
Cholesterol: 177 mg/dL (ref 0–200)
HDL: 55.4 mg/dL (ref >39.00)
LDL Cholesterol: 102 mg/dL — ABNORMAL HIGH (ref 0–99)
NonHDL: 121.65
Total CHOL/HDL Ratio: 3
Triglycerides: 97 mg/dL (ref 0.0–149.0)
VLDL: 19.4 mg/dL (ref 0.0–40.0)

## 2021-09-14 LAB — TSH: TSH: 0.94 u[IU]/mL (ref 0.35–5.50)

## 2021-09-14 LAB — COMPREHENSIVE METABOLIC PANEL
ALT: 7 U/L (ref 0–35)
AST: 24 U/L (ref 0–37)
Albumin: 4.7 g/dL (ref 3.5–5.2)
Alkaline Phosphatase: 35 U/L — ABNORMAL LOW (ref 39–117)
BUN: 18 mg/dL (ref 6–23)
CO2: 31 mEq/L (ref 19–32)
Calcium: 9.5 mg/dL (ref 8.4–10.5)
Chloride: 94 mEq/L — ABNORMAL LOW (ref 96–112)
Creatinine, Ser: 0.74 mg/dL (ref 0.40–1.20)
GFR: 72.62 mL/min (ref >60.00)
Glucose, Bld: 98 mg/dL (ref 70–99)
Potassium: 3.5 mEq/L (ref 3.5–5.1)
Sodium: 134 mEq/L — ABNORMAL LOW (ref 135–145)
Total Bilirubin: 1 mg/dL (ref 0.2–1.2)
Total Protein: 6.3 g/dL (ref 6.0–8.3)

## 2021-09-14 MED ORDER — SERTRALINE HCL 25 MG PO TABS: 25.0000 mg | ORAL_TABLET | Freq: Every day | ORAL | 1 refills | Status: DC

## 2021-09-14 MED ORDER — FAMOTIDINE 40 MG PO TABS: 40.0000 mg | ORAL_TABLET | Freq: Every evening | ORAL | 1 refills | Status: DC | PRN

## 2021-09-14 NOTE — Assessment & Plan Note (Signed)
Supplement and monitor 

## 2021-09-14 NOTE — Assessment & Plan Note (Signed)
Well controlled, no changes to meds. Encouraged heart healthy diet such as the DASH diet and exercise as tolerated. Home log is reviewed numbers WNL

## 2021-09-14 NOTE — Assessment & Plan Note (Signed)
Improved after course of Budesonide and with stopping Diclofenac

## 2021-09-14 NOTE — Progress Notes (Signed)
Subjective:   By signing my name below, I, Zite Okoli, attest that this documentation has been prepared under the direction and in the presence of Mosie Lukes, MD. 09/14/2021    Patient ID: Stephanie [name], female    DOB: Sep 24, 1932, 88 y.o.   MRN: [id number]  Chief Complaint  Patient presents with   Follow-up    HPI Patient is in today for an office visit and 4 months f/u.  She reports that the dyskinesia is worse than the tremors. She reports that they do not wake her up at night since she started taking 5 mg valium.   She would like to see an orthopedic surgeon about her shoulder because she has been experiencing pain in her left shoulder that radiates to the back of her arm. She tries to exercise the shoulder by raising her arm 10 times daily. She believes the shoulder was frozen at one time but did some exercise to release it. She was taking diclofenac but had to stop because of side effects of diarrhea. However, stopping the diclofenac has worsened her symptoms.   She reports having acid reflux and mentions that pills get stuck in her throat sometimes.  She is trying to stop taking sertraline because she does not think she needs it anymore and does not see a difference.   He has 4 Pfizer Covid-19 vaccines at this time. She is UTD on the flu vaccine.   Past Medical History:  Diagnosis Date   Allergy    Anemia    past hx    Arthropathy of cervical spine 11/27/2012   Right  Xray at [facility]  On 04/08/2016  AP, lateral, and lateral flexion and extension views of the cervical spine are submitted for evaluation.   No acute fracture identified in the cervical spine.   There is redemonstration of approximately 2 mm of C3 on C4 anterolisthesis in neutral which slightly increases in flexion relative to neutral and extension. Slight C5 on C6 retrolisthesis does not change i   Benign hypertension without congestive heart failure    Benign mole    right hcets mole, bleeds when dried with  towel   Bilateral dry eyes    Bladder cancer ([place]) November 04, 2016   Cervical spondylosis    Cervicalgia    2 screws in place    Chronic kidney disease    kidney stones    Depression with anxiety 04-Nov-2016   prior to husbands death    Gross hematuria    developed after first hip replacment, led to indicental finding of bladder tumor, bleeding now resolved    History of kidney stones    1970's   Left hand weakness    due to reinjury of flxor tendon s/p tendon surgery march 12-2018   Lumbar stenosis    Malignant tumor of urinary bladder ([place]) 07/2016   Pre-stage I   OA (osteoarthritis)    Parkinson disease [facility]) dx 2012   neurologist-  dr siddiqui at [place] [facility])     Dr Trudie Reed , [place] [facility]    Rupture of flexor tendon of hand 2020   right    Past Surgical History:  Procedure Laterality Date   BIOPSY MASS LEFT FIRST METACARPAL   06/23/2006   benign   CATARACT EXTRACTION W/ INTRAOCULAR LENS  IMPLANT, BILATERAL  1999 and 2003   CERVICAL FUSION  02/2017   c1-c2 ; reports she has 2 screws 2in long in place done at  [facility] ; patient exhibits VERY LIMITED NECK ROM   COLONOSCOPY     CYSTOSCOPY W/ RETROGRADES Bilateral 09/22/2016   Procedure: CYSTOSCOPY WITH RETROGRADE PYELOGRAM;  Surgeon: Alexis Frock, MD;  Location: [facility];  Service: Urology;  Laterality: Bilateral;   D & C HYSTEROSCOPY W/ RESECTION POLYP  07/11/2000   DILATION AND CURETTAGE OF UTERUS  1960's   EXCISION CYST AND DEBRIDEMENT RIGHT WIRST AND REMOVAL FORGEIGN BODY  01/09/2009   FLEXOR TENDON REPAIR Left 12/12/2018   Procedure: REPAIR/TRANSFER FLEXOR DIGITORUM PROFUNDUS OF LEFT SMALL FINGER;  Surgeon: Daryll Brod, MD;  Location: [place];  Service: Orthopedics;  Laterality: Left;   LAPAROSCOPY  yrs ago   infertility    METACARPOPHALANGEAL JOINT ARTHRODESIS Right 05/25/1996   TENDON REPAIR Left 01/15/2019   Hand   TONSILLECTOMY AND ADENOIDECTOMY  child    TOTAL HIP ARTHROPLASTY Left 07/07/2016   Procedure: LEFT TOTAL HIP ARTHROPLASTY ANTERIOR APPROACH;  Surgeon: Gaynelle Arabian, MD;  Location: [facility];  Service: Orthopedics;  Laterality: Left;   TOTAL HIP ARTHROPLASTY Right 09/28/2017   Procedure: RIGHT TOTAL HIP ARTHROPLASTY ANTERIOR APPROACH;  Surgeon: Gaynelle Arabian, MD;  Location: [facility];  Service: Orthopedics;  Laterality: Right;   TOTAL KNEE ARTHROPLASTY Right 07/30/2019   Procedure: TOTAL KNEE ARTHROPLASTY;  Surgeon: Gaynelle Arabian, MD;  Location: [facility];  Service: Orthopedics;  Laterality: Right;  56min   TRANSURETHRAL RESECTION OF BLADDER TUMOR N/A 09/22/2016   Procedure: TRANSURETHRAL RESECTION OF BLADDER TUMOR (TURBT);  Surgeon: Alexis Frock, MD;  Location: [facility];  Service: Urology;  Laterality: N/A;    Family History  Problem Relation Age of Onset   Arthritis Mother    Hypertension Mother    Deep vein thrombosis Mother        recurrent, secondary to Bleeding disorder   Arthritis Father    Stroke Father    Bladder Cancer Father    Diabetes Sister    Heart disease Sister    Obesity Sister    Arthritis Sister    Hypertension Sister    Heart disease Maternal Grandmother    Heart disease Maternal Grandfather    Peripheral vascular disease Paternal Grandmother        s/p leg amputation   Stroke Paternal Grandfather    Esophageal cancer Paternal Aunt    Colon cancer Neg Hx    Colon polyps Neg Hx    Rectal cancer Neg Hx    Stomach cancer Neg Hx     Social History   Socioeconomic History   Marital status: Widowed    Spouse name: Not on file   Number of children: Not on file   Years of education: Not on file   Highest education level: Not on file  Occupational History   Not on file  Tobacco Use   Smoking status: Former    Years: 10.00    Types: Cigarettes    Quit date: 05/04/1964    Years since quitting: 57.4   Smokeless tobacco: Never  Vaping Use   Vaping Use: Never used  Substance and  Sexual Activity   Alcohol use: Yes    Alcohol/week: 7.0 standard drinks    Types: 7 Glasses of wine per week    Comment: ocassionally- social    Drug use: No   Sexual activity: Not Currently    Partners: Male  Other Topics Concern   Not on file  Social History Narrative   Not on file   Social Determinants  of Health   Financial Resource Strain: Low Risk    Difficulty of Paying Living Expenses: Not hard at all  Food Insecurity: No Food Insecurity   Worried About [place] in the Last Year: Never true   [place] in the Last Year: Never true  Transportation Needs: No Transportation Needs   Lack of Transportation (Medical): No   Lack of Transportation (Non-Medical): No  Physical Activity: Sufficiently Active   Days of Exercise per Week: 5 days   Minutes of Exercise per Session: 30 min  Stress: No Stress Concern Present   Feeling of Stress : Not at all  Social Connections: Moderately Integrated   Frequency of Communication with Friends and Family: More than three times a week   Frequency of Social Gatherings with Friends and Family: More than three times a week   Attends Religious Services: More than 4 times per year   Active Member of [employer] or Organizations: Yes   Attends Archivist Meetings: More than 4 times per year   Marital Status: Widowed  Human resources officer Violence: Not At Risk   Fear of Current or Ex-Partner: No   Emotionally Abused: No   Physically Abused: No   Sexually Abused: No    Outpatient Medications Prior to Visit  Medication Sig Dispense Refill   amLODipine (NORVASC) 5 MG tablet Take 1 tablet (5 mg total) by mouth daily. 90 tablet 3   carbidopa-levodopa (SINEMET IR) 25-100 MG per tablet Take 1 tablet by mouth 4 (four) times daily.      Carbidopa-Levodopa ER (SINEMET CR) 25-100 MG tablet controlled release Take 1 tablet by mouth at bedtime.     cholecalciferol (VITAMIN D3) 25 MCG (1000 UNIT) tablet Take 1,000 Units by mouth daily.      cholestyramine (QUESTRAN) 4 g packet Take 4 g by mouth as needed.     diazepam (VALIUM) 5 MG tablet TAKE 1 TABLET (5 MG TOTAL) BY MOUTH EVERY 12 (TWELVE) HOURS AS NEEDED. FOR ANXIETY 60 tablet 1   estradiol (ESTRACE) 0.5 MG tablet TAKE 1/2 TABLET BY MOUTH DAILY 45 tablet 1   fluticasone (CUTIVATE) 0.05 % cream Apply 1 application topically daily as needed (scalp psoriasis).  1   golimumab (SIMPONI ARIA) 50 MG/4ML SOLN injection Inject 50 mg into the vein See admin instructions. Given every 2 months - Last given 06/28/2019     hydrochlorothiazide (HYDRODIURIL) 25 MG tablet Take 1.5 tablets (37.5 mg total) by mouth daily. 135 tablet 1   medroxyPROGESTERone (PROVERA) 2.5 MG tablet TAKE 0.5 TABLETS (1.25 MG TOTAL) BY MOUTH DAILY. 45 tablet 3   melatonin 5 MG TABS Take 5 mg by mouth at bedtime as needed.     Multiple Vitamins-Minerals (CENTRUM SILVER 50+WOMEN) TABS Take by mouth daily.     Probiotic Product (PROBIOTIC PO) Take by mouth.     sertraline (ZOLOFT) 25 MG tablet TAKE 1 TABLET (25 MG TOTAL) BY MOUTH DAILY. (Patient taking differently: Take 12.5 mg by mouth daily.) 90 tablet 1   vitamin C (ASCORBIC ACID) 500 MG tablet Take 500 mg by mouth daily.     Coenzyme Q10 (CO Q 10 PO) Take 300 mg by mouth daily in the afternoon.     diclofenac (VOLTAREN) 50 MG EC tablet Take 50 mg by mouth daily.  (Patient not taking: No sig reported)     diclofenac (VOLTAREN) 50 MG EC tablet Take 1 tablet (50 mg total) by mouth daily. 30 tablet 3  No facility-administered medications prior to visit.    Allergies  Allergen Reactions   Clindamycin Hcl Diarrhea   Codeine Nausea And Vomiting   Doxycycline Diarrhea   Hornet Venom    Penicillins Itching and Swelling    Has patient had a PCN reaction causing immediate rash, facial/tongue/throat swelling, SOB or lightheadedness with hypotension: Yes Has patient had a PCN reaction causing severe rash involving mucus membranes or skin necrosis: No Has patient had a  PCN reaction that required hospitalization: No Has patient had a PCN reaction occurring within the last 10 years: No If all of the above answers are "NO", then may proceed with Cephalosporin use.    Yellow Jacket Venom Swelling   Lodine [Etodolac] Rash   Tramadol Itching and Rash    Review of Systems  Constitutional:  Negative for fever and malaise/fatigue.  HENT:  Negative for congestion.   Eyes:  Negative for redness.  Respiratory:  Negative for shortness of breath.   Cardiovascular:  Negative for chest pain, palpitations and leg swelling.  Gastrointestinal:  Negative for abdominal pain, blood in stool and nausea.       (+) acid reflux  Genitourinary:  Negative for dysuria and frequency.  Musculoskeletal:  Positive for joint pain (left shoulder). Negative for falls.  Skin:  Negative for rash.  Neurological:  Positive for tremors and weakness. Negative for dizziness, loss of consciousness and headaches.       (+) dyskinesia   Endo/Heme/Allergies:  Negative for polydipsia.  Psychiatric/Behavioral:  Negative for depression. The patient is not nervous/anxious.       Objective:    Physical Exam Constitutional:      General: She is not in acute distress.    Appearance: She is well-developed.  HENT:     Head: Normocephalic and atraumatic.  Eyes:     Conjunctiva/sclera: Conjunctivae normal.  Neck:     Thyroid: No thyromegaly.  Cardiovascular:     Rate and Rhythm: Normal rate and regular rhythm.     Heart sounds: Normal heart sounds. No murmur heard. Pulmonary:     Effort: Pulmonary effort is normal. No respiratory distress.     Breath sounds: Normal breath sounds.  Abdominal:     General: Bowel sounds are normal. There is no distension.     Palpations: Abdomen is soft. There is no mass.     Tenderness: There is no abdominal tenderness.  Musculoskeletal:     Cervical back: Neck supple.  Lymphadenopathy:     Cervical: No cervical adenopathy.  Skin:    General: Skin is  warm and dry.  Neurological:     Mental Status: She is alert and oriented to person, place, and time.  Psychiatric:        Behavior: Behavior normal.    BP 115/75 (BP Location: Left Arm, Cuff Size: Normal)   Pulse 85   Temp 98 F (36.7 C) (Oral)   Resp 12   Ht 5\' 2"  (1.575 m)   Wt 123 lb (55.8 kg)   LMP 01/13/2013 Comment: spotting-had benign endometrial biopsy   SpO2 98%   BMI 22.50 kg/m  Wt Readings from Last 3 Encounters:  09/14/21 123 lb (55.8 kg)  05/14/21 122 lb 9.6 oz (55.6 kg)  04/27/21 119 lb 9.6 oz (54.3 kg)    Diabetic Foot Exam - Simple   No data filed    Lab Results  Component Value Date   WBC 6.2 05/07/2021   HGB 13.7 05/07/2021   HCT 40.4 05/07/2021  PLT 194.0 05/07/2021   GLUCOSE 85 05/07/2021   CHOL 175 05/07/2021   TRIG 86.0 05/07/2021   HDL 78.10 05/07/2021   LDLDIRECT 120.0 12/11/2014   LDLCALC 79 05/07/2021   ALT 6 05/07/2021   AST 23 05/07/2021   NA 135 05/07/2021   K 3.7 05/07/2021   CL 96 05/07/2021   CREATININE 0.83 05/07/2021   BUN 15 05/07/2021   CO2 31 05/07/2021   TSH 1.55 05/07/2021   INR 1.0 07/25/2019   HGBA1C 5.1 02/14/2020    Lab Results  Component Value Date   TSH 1.55 05/07/2021   Lab Results  Component Value Date   WBC 6.2 05/07/2021   HGB 13.7 05/07/2021   HCT 40.4 05/07/2021   MCV 91.9 05/07/2021   PLT 194.0 05/07/2021   Lab Results  Component Value Date   NA 135 05/07/2021   K 3.7 05/07/2021   CO2 31 05/07/2021   GLUCOSE 85 05/07/2021   BUN 15 05/07/2021   CREATININE 0.83 05/07/2021   BILITOT 1.2 05/07/2021   ALKPHOS 42 05/07/2021   AST 23 05/07/2021   ALT 6 05/07/2021   PROT 6.6 05/07/2021   ALBUMIN 4.8 05/07/2021   CALCIUM 9.7 05/07/2021   ANIONGAP 10 07/31/2019   GFR 63.43 05/07/2021   Lab Results  Component Value Date   CHOL 175 05/07/2021   Lab Results  Component Value Date   HDL 78.10 05/07/2021   Lab Results  Component Value Date   LDLCALC 79 05/07/2021   Lab Results   Component Value Date   TRIG 86.0 05/07/2021   Lab Results  Component Value Date   CHOLHDL 2 05/07/2021   Lab Results  Component Value Date   HGBA1C 5.1 02/14/2020       Assessment & Plan:   Problem List Items Addressed This Visit     HTN (hypertension)    Well controlled, no changes to meds. Encouraged heart healthy diet such as the DASH diet and exercise as tolerated. Home log is reviewed numbers WNL      Relevant Orders   CBC   Comprehensive metabolic panel   TSH   Parkinson's disease ([place])    Her tremor especially in her left arm. Sinemet is helping some. Follows with neurology      Relevant Orders   Ambulatory referral to Physical Therapy   Hyperlipidemia   Relevant Orders   Lipid panel   Low back pain   Relevant Orders   Ambulatory referral to Physical Therapy   Depression with anxiety    She is only on Sertraline 12.5 mg now and feels well. She does not believe the medicine is helping so she will stop and we will reevaluate.       Psoriatic arthritis ([place])    Struggles with daily pain follows with rheumatology and is tolerating Simponi but she had to stop Diclofenac and that has made her pain worse.       Relevant Orders   Ambulatory referral to Physical Therapy   Diarrhea    Improved after course of Budesonide and with stopping Diclofenac      Vitamin D deficiency    Supplement and monitor      Left shoulder pain - Primary    No fall or injury. Will start with Xray and PT and consider referral if no improvement      Relevant Orders   DG Shoulder Left   Ambulatory referral to Physical Therapy     Meds ordered this encounter  Medications   famotidine (PEPCID) 40 MG tablet    Sig: Take 1 tablet (40 mg total) by mouth at bedtime as needed for heartburn or indigestion.    Dispense:  30 tablet    Refill:  1    I,Zite Okoli,acting as a scribe for Penni Homans, MD.,have documented all relevant documentation on the behalf of Penni Homans, MD,as  directed by  Penni Homans, MD while in the presence of Penni Homans, MD.   I, Mosie Lukes, MD., personally preformed the services described in this documentation.  All medical record entries made by the scribe were at my direction and in my presence.  I have reviewed the chart and discharge instructions (if applicable) and agree that the record reflects my personal performance and is accurate and complete. 09/14/2021

## 2021-09-14 NOTE — Assessment & Plan Note (Signed)
Her tremor especially in her left arm. Sinemet is helping some. Follows with neurology

## 2021-09-14 NOTE — Patient Instructions (Signed)
Shoulder Pain °Many things can cause shoulder pain, including: °An injury to the shoulder. °Overuse of the shoulder. °Arthritis. °The source of the pain can be: °Inflammation. °An injury to the shoulder joint. °An injury to a tendon, ligament, or bone. °Follow these instructions at home: °Pay attention to changes in your symptoms. Let your health care provider know about them. Follow these instructions to relieve your pain. °If you have a sling: °Wear the sling as told by your health care provider. Remove it only as told by your health care provider. °Loosen the sling if your fingers tingle, become numb, or turn cold and blue. °Keep the sling clean. °If the sling is not waterproof: °Do not let it get wet. Remove it to shower or bathe. °Move your arm as little as possible, but keep your hand moving to prevent swelling. °Managing pain, stiffness, and swelling ° °If directed, put ice on the painful area: °Put ice in a plastic bag. °Place a towel between your skin and the bag. °Leave the ice on for 20 minutes, 2-3 times per day. Stop applying ice if it does not help with the pain. °Squeeze a soft ball or a foam pad as much as possible. This helps to keep the shoulder from swelling. It also helps to strengthen the arm. °General instructions °Take over-the-counter and prescription medicines only as told by your health care provider. °Keep all follow-up visits as told by your health care provider. This is important. °Contact a health care provider if: °Your pain gets worse. °Your pain is not relieved with medicines. °New pain develops in your arm, hand, or fingers. °Get help right away if: °Your arm, hand, or fingers: °Tingle. °Become numb. °Become swollen. °Become painful. °Turn white or blue. °Summary °Shoulder pain can be caused by an injury, overuse, or arthritis. °Pay attention to changes in your symptoms. Let your health care provider know about them. °This condition may be treated with a sling, ice, and pain  medicines. °Contact your health care provider if the pain gets worse or new pain develops. Get help right away if your arm, hand, or fingers tingle or become numb, swollen, or painful. °Keep all follow-up visits as told by your health care provider. This is important. °This information is not intended to replace advice given to you by your health care provider. Make sure you discuss any questions you have with your health care provider. °Document Revised: 05/16/2018 Document Reviewed: 05/16/2018 °Elsevier Patient Education © 2022 [employer] ° °

## 2021-09-14 NOTE — Assessment & Plan Note (Signed)
She is only on Sertraline 12.5 mg now and feels well. She does not believe the medicine is helping so she will stop and we will reevaluate.

## 2021-09-14 NOTE — Assessment & Plan Note (Signed)
No fall or injury. Will start with Xray and PT and consider referral if no improvement

## 2021-09-14 NOTE — Assessment & Plan Note (Signed)
Struggles with daily pain follows with rheumatology and is tolerating Simponi but she had to stop Diclofenac and that has made her pain worse.

## 2021-09-17 DIAGNOSIS — L4050 Arthropathic psoriasis, unspecified: Secondary | ICD-10-CM | POA: Diagnosis not present

## 2021-09-17 DIAGNOSIS — Z79899 Other long term (current) drug therapy: Secondary | ICD-10-CM | POA: Diagnosis not present

## 2021-09-28 ENCOUNTER — Other Ambulatory Visit: Payer: Self-pay

## 2021-09-28 MED ORDER — ESTRADIOL 0.5 MG PO TABS: 0.2500 mg | ORAL_TABLET | Freq: Every day | ORAL | 1 refills | Status: DC

## 2021-09-28 MED ORDER — MEDROXYPROGESTERONE ACETATE 2.5 MG PO TABS: 1.2500 mg | ORAL_TABLET | Freq: Every day | ORAL | 3 refills | Status: DC

## 2021-10-08 ENCOUNTER — Other Ambulatory Visit: Payer: Self-pay | Admitting: Family Medicine

## 2021-10-14 ENCOUNTER — Other Ambulatory Visit: Payer: Self-pay | Admitting: Family Medicine

## 2021-10-14 DIAGNOSIS — Z23 Encounter for immunization: Secondary | ICD-10-CM | POA: Diagnosis not present

## 2021-10-19 ENCOUNTER — Ambulatory Visit: Payer: Medicare Other | Attending: Family Medicine | Admitting: Physical Therapy

## 2021-10-19 ENCOUNTER — Other Ambulatory Visit: Payer: Self-pay

## 2021-10-19 ENCOUNTER — Encounter: Payer: Self-pay | Admitting: Physical Therapy

## 2021-10-19 DIAGNOSIS — C641 Malignant neoplasm of right kidney, except renal pelvis: Secondary | ICD-10-CM | POA: Diagnosis not present

## 2021-10-19 DIAGNOSIS — M546 Pain in thoracic spine: Secondary | ICD-10-CM | POA: Diagnosis not present

## 2021-10-19 DIAGNOSIS — M6281 Muscle weakness (generalized): Secondary | ICD-10-CM | POA: Diagnosis not present

## 2021-10-19 DIAGNOSIS — M545 Low back pain, unspecified: Secondary | ICD-10-CM | POA: Diagnosis not present

## 2021-10-19 DIAGNOSIS — L4050 Arthropathic psoriasis, unspecified: Secondary | ICD-10-CM | POA: Diagnosis not present

## 2021-10-19 DIAGNOSIS — R2681 Unsteadiness on feet: Secondary | ICD-10-CM | POA: Diagnosis not present

## 2021-10-19 DIAGNOSIS — R2689 Other abnormalities of gait and mobility: Secondary | ICD-10-CM | POA: Diagnosis not present

## 2021-10-19 DIAGNOSIS — M25612 Stiffness of left shoulder, not elsewhere classified: Secondary | ICD-10-CM | POA: Diagnosis not present

## 2021-10-19 DIAGNOSIS — M542 Cervicalgia: Secondary | ICD-10-CM | POA: Insufficient documentation

## 2021-10-19 DIAGNOSIS — R293 Abnormal posture: Secondary | ICD-10-CM | POA: Diagnosis not present

## 2021-10-19 DIAGNOSIS — M25512 Pain in left shoulder: Secondary | ICD-10-CM | POA: Diagnosis not present

## 2021-10-19 DIAGNOSIS — G20 Parkinson's disease: Secondary | ICD-10-CM | POA: Insufficient documentation

## 2021-10-19 DIAGNOSIS — G8929 Other chronic pain: Secondary | ICD-10-CM | POA: Insufficient documentation

## 2021-10-19 NOTE — Patient Instructions (Addendum)
    Access Code: [email or web address] URL: [email or web address] Date: 10/19/2021 Prepared by: Annie Paras  Exercises Shoulder Rolls in Sitting - 2-3 x daily - 7 x weekly - 2 sets - 10 reps Seated Scapular Retraction - 2-3 x daily - 7 x weekly - 2 sets - 10 reps - 5 sec hold

## 2021-10-19 NOTE — Therapy (Signed)
[facility] [address]  [place], [place], [address] Phone: [phone]   Fax:  [phone]  Physical Therapy Evaluation  Patient Details  Name: Stephanie [name] MRN: [id number] Date of Birth: 1932-02-07 Referring Provider (PT): Penni Homans, MD   Encounter Date: 10/19/2021   PT End of Session - 10/19/21 1448     Visit Number 1    Number of Visits 16    Date for PT Re-Evaluation 12/14/21    Authorization Type Medicare & BCBS    PT Start Time 1448    PT Stop Time 1536    PT Time Calculation (min) 48 min    Activity Tolerance Patient tolerated treatment well    Behavior During Therapy [facility] for tasks assessed/performed             Past Medical History:  Diagnosis Date   Allergy    Anemia    past hx    Arthropathy of cervical spine 11/27/2012   Right  Xray at [facility]  On 04/08/2016  AP, lateral, and lateral flexion and extension views of the cervical spine are submitted for evaluation.   No acute fracture identified in the cervical spine.   There is redemonstration of approximately 2 mm of C3 on C4 anterolisthesis in neutral which slightly increases in flexion relative to neutral and extension. Slight C5 on C6 retrolisthesis does not change i   Benign hypertension without congestive heart failure    Benign mole    right hcets mole, bleeds when dried with towel   Bilateral dry eyes    Bladder cancer ([place]) Oct 29, 2016   Cervical spondylosis    Cervicalgia    2 screws in place    Chronic kidney disease    kidney stones    Depression with anxiety 10/29/16   prior to husbands death    Gross hematuria    developed after first hip replacment, led to indicental finding of bladder tumor, bleeding now resolved    History of kidney stones    1970's   Left hand weakness    due to reinjury of flxor tendon s/p tendon surgery march 12-2018   Lumbar stenosis    Malignant tumor of urinary bladder ([place]) 07/2016   Pre-stage I    OA (osteoarthritis)    Parkinson disease [facility]) dx 87   neurologist-  dr siddiqui at [place] [facility])     Dr Trudie Reed , [place] [facility]    Rupture of flexor tendon of hand 2020   right    Past Surgical History:  Procedure Laterality Date   BIOPSY MASS LEFT FIRST METACARPAL   06/23/2006   benign   CATARACT EXTRACTION W/ INTRAOCULAR LENS  IMPLANT, BILATERAL  1999 and 2003   CERVICAL FUSION  02/2017   c1-c2 ; reports she has 2 screws 2in long in place done at [facility] ; patient exhibits VERY LIMITED NECK ROM   COLONOSCOPY     CYSTOSCOPY W/ RETROGRADES Bilateral 09/22/2016   Procedure: CYSTOSCOPY WITH RETROGRADE PYELOGRAM;  Surgeon: Alexis Frock, MD;  Location: [facility];  Service: Urology;  Laterality: Bilateral;   D & C HYSTEROSCOPY W/ RESECTION POLYP  07/11/2000   DILATION AND CURETTAGE OF UTERUS  1960's   EXCISION CYST AND DEBRIDEMENT RIGHT WIRST AND REMOVAL FORGEIGN BODY  01/09/2009   FLEXOR TENDON REPAIR Left 12/12/2018   Procedure: REPAIR/TRANSFER FLEXOR DIGITORUM PROFUNDUS OF LEFT SMALL FINGER;  Surgeon: Daryll Brod, MD;  Location: [facility];  Service: Orthopedics;  Laterality: Left;   LAPAROSCOPY  yrs ago   infertility    METACARPOPHALANGEAL JOINT ARTHRODESIS Right 05/25/1996   TENDON REPAIR Left 01/15/2019   Hand   TONSILLECTOMY AND ADENOIDECTOMY  child   TOTAL HIP ARTHROPLASTY Left 07/07/2016   Procedure: LEFT TOTAL HIP ARTHROPLASTY ANTERIOR APPROACH;  Surgeon: Gaynelle Arabian, MD;  Location: [facility];  Service: Orthopedics;  Laterality: Left;   TOTAL HIP ARTHROPLASTY Right 09/28/2017   Procedure: RIGHT TOTAL HIP ARTHROPLASTY ANTERIOR APPROACH;  Surgeon: Gaynelle Arabian, MD;  Location: [facility];  Service: Orthopedics;  Laterality: Right;   TOTAL KNEE ARTHROPLASTY Right 07/30/2019   Procedure: TOTAL KNEE ARTHROPLASTY;  Surgeon: Gaynelle Arabian, MD;  Location: [facility];  Service: Orthopedics;  Laterality: Right;  65min    TRANSURETHRAL RESECTION OF BLADDER TUMOR N/A 09/22/2016   Procedure: TRANSURETHRAL RESECTION OF BLADDER TUMOR (TURBT);  Surgeon: Alexis Frock, MD;  Location: [facility];  Service: Urology;  Laterality: N/A;    There were no vitals filed for this visit.    Subjective Assessment - 10/19/21 1453     Subjective Referral received for multiple diagnoses: Lshoulder pain, LBP (pt denies LBP currently but does report L upper back and neck pain), psoriatic arthritis, and Parkinson's disease. Her primary concern is her L shoulder as she notes difficulty with upper body dressing. L UE also limited by torn tendons in her wrist from psoriatic arthritis - has had surgery but retorn - wears soft splint on her hand. No formal PT for PD but has had extensive PT after TKA & THAs as well as PT for her neck and OT for her hand after tendon repair.    Pertinent History R TKA 07/2019, B THR 2018 & 2017, posterior C1-2 cervical fusion 2018, rupture of flexor tendon of L hand with failed surgical repair 2020, Parkinson's, bladder cancer, HTN    Limitations Sitting;Standing;Walking    How long can you stand comfortably? 15-20 minutes    Patient Stated Goals "less pain - able to dress upper body or put on a coat w/o pain"    Currently in Pain? Yes    Pain Score 2    up to 10/10 at worst   Pain Location Shoulder    Pain Orientation Left;Upper;Lateral    Pain Descriptors / Indicators Stabbing    Pain Type Chronic pain    Pain Radiating Towards into L scapula    Pain Onset Other (comment)   1+ years   Pain Frequency Constant    Aggravating Factors  taking her shirt off, putting a coat on    Pain Relieving Factors topical analgesic - Voltaren    Effect of Pain on Daily Activities wakes her up at night; difficulty with UB dressing; difficulty lifting things with L arm    Pain Score 2   1-2/10   Pain Location Neck   & upper back   Pain Orientation Left    Pain Descriptors / Indicators Dull;Aching     Pain Type Chronic pain    Pain Onset Other (comment)   1+ year   Pain Frequency Intermittent    Aggravating Factors  any movement of neck    Pain Relieving Factors Voltaren; heating pad with vibration    Effect of Pain on Daily Activities pain interferes with "just about everything"; has to sit directly in front of the pulpit at church d/t pain if she has to turn her head to look at the priest  [facility] PT Assessment - 10/19/21 1448       Assessment   Medical Diagnosis Left shoulder pain, Neck & upper back pain, Psoriatic arthritis, Parkinson's disease    Referring Provider (PT) Penni Homans, MD    Onset Date/Surgical Date --   1+ yrs   Hand Dominance Right    Next MD Visit 01/26/22    Prior Therapy No formal PT for PD but has had extensive PT after TKA & THAs as well as PT for her neck and OT for her hand after tendon repair.      Precautions   Precautions None      Restrictions   Weight Bearing Restrictions No      Balance Screen   Has the patient fallen in the past 6 months No    Has the patient had a decrease in activity level because of a fear of falling?  Yes    Is the patient reluctant to leave their home because of a fear of falling?  No      Home Environment   Living Environment Private residence    Living Arrangements Alone    Type of [place] to enter    Entrance Stairs-Number of Steps 4   to garage   Entrance Stairs-Rails Right;Left;Can reach both    [place] One level    [place] - 2 wheels;Cane - single point      Prior Function   Level of Independence Independent    Vocation Retired    Leisure daily stretching & walking 10-15 minutes; active in church - bible study      Cognition   Overall Cognitive Status Within Functional Limits for tasks assessed      Posture/Postural Control   Posture/Postural Control Postural limitations    Postural Limitations Forward head;Rounded Shoulders   scolosis with  head shifted to R     ROM / Strength   AROM / PROM / Strength AROM;Strength      AROM   Overall AROM Comments h/o C1-2 spinal fusion    AROM Assessment Site Shoulder;Cervical    Right/Left Shoulder Right;Left    Right Shoulder Flexion 160 Degrees    Right Shoulder ABduction 175 Degrees    Right Shoulder Internal Rotation --   FIR to T12   Right Shoulder External Rotation --   FER to T2   Left Shoulder Flexion 130 Degrees    Left Shoulder ABduction 80 Degrees    Left Shoulder Internal Rotation --   FIR to L4   Left Shoulder External Rotation --   FER to top of shoulder   Cervical Flexion 50    Cervical Extension 30    Cervical - Right Side Bend 20    Cervical - Left Side Bend 7    Cervical - Right Rotation 35    Cervical - Left Rotation 34      Strength   Strength Assessment Site Shoulder;Hand    Right/Left Shoulder Right;Left    Right Shoulder Flexion 4+/5    Right Shoulder ABduction 4/5    Right Shoulder Internal Rotation 4+/5    Right Shoulder External Rotation 4+/5    Left Shoulder Flexion 4/5    Left Shoulder ABduction 4-/5   pain   Left Shoulder Internal Rotation 4/5    Left Shoulder External Rotation 4/5    Right/Left hand Right;Left    Right Hand Gross Grasp Functional    Left Hand Gross Grasp  Impaired      Palpation   Palpation comment increased muscle tension and TTP t/o L posterior shoulder complex/periscapular muscles and L>R UT/LS and cervicothoracic paraspinals                        Objective measurements completed on examination: See above findings.       [facility] Adult PT Treatment/Exercise - 10/19/21 1448       Neck Exercises: Seated   Shoulder Rolls Backwards;10 reps      Shoulder Exercises: Seated   Retraction Both;10 reps;AROM;Strengthening    Retraction Limitations + slight depression                     PT Education - 10/19/21 1535     Education Details PT eval findings, anticipated POC and initial HEP (Access  Code: [email or web address]) related to shoulder and neck; need for further evaluation of her PD    Person(s) Educated Patient    Methods Explanation;Demonstration;Verbal cues;Handout    Comprehension Verbalized understanding;Verbal cues required;Returned demonstration;Need further instruction              PT Short Term Goals - 10/19/21 1536       PT SHORT TERM GOAL #1   Title Patient will be independent with initial HEP    Status New    Target Date 11/16/21      PT SHORT TERM GOAL #2   Title Patient will demonstrate decreased tightness/spasm in the L upper trap/ levator scapulae to decrease pain to </= 5/10 with movement and promote improved cervical and L shoulder mobility    Status New    Target Date 11/16/21      PT SHORT TERM GOAL #3   Title Patient will be able to verbalize and demonstrate proper posture with sitting/standing to decrease/reduce muscle tightness/reinjury    Status New    Target Date 11/16/21      PT SHORT TERM GOAL #4   Title Establish further STGs/LTGs as indicated based on assessment of deficits related to Parkinson's disease    Status New    Target Date 11/02/21               PT Long Term Goals - 10/19/21 1536       PT LONG TERM GOAL #1   Title Patient will be independent with ongoing/advanced HEP for self-management at home    Status New    Target Date 12/14/21      PT LONG TERM GOAL #2   Title Patient to improve L shoudler AROM to [facility] without pain provocation to allow for increased ease of UB dressing    Status New    Target Date 12/14/21      PT LONG TERM GOAL #3   Title Patient to improve cervical AROM by >/= 25% with pain dereased to </= 5/10 with movement to allow for increased participation in church services and improve safety with driving    Status New    Target Date 12/14/21                    Plan - 10/19/21 1536     Clinical Impression Statement Dusty is an 87 y/o female who presents to OP PT for Lt shoulder pain,  neck/upper back pain, psoriatic arthritis and Parkinson's disease. Her greatest concern is related to L shoulder pain and LOM which limits her ability don/doff UB clothing or a coat. Deficits include L shoulder,  scapular, neck and upper back pain, postural abnormalities, increased muscle tension and TTP t/o L posterior shoulder complex/periscapular muscles and L>R UT/LS and cervicothoracic paraspinals, limited and painful L shoulder AROM along with limited L hand/grip AROM from h/o tendon rupture, decreased L shoulder strength and limited functional use of L arm. Xandrea will benefit from skilled PT to address above deficits, improve posture and improve functional ROM and strength in L shoulder with decreased pain to allow her to resume normal daily activities without pain interference. Further evaluation of deficits related to Parkinson's disease will be assessed in upcoming visits with POC updated accordingly.    Personal Factors and Comorbidities Age;Comorbidity 3+;Fitness;Past/Current Experience;Social Background;Time since onset of injury/illness/exacerbation    Comorbidities R TKA 07/2019, B THR 2018 & 2017, cervical fusion 02/23/17 (Posterior cervical approach for a C2 neurectomy on the right side, posterior fusion C1 and C2 with structural allograft and morselized allograft, transarticular screw fixation), h/o L frozen shoulder per pt report, rupture of flexor tendon of L hand with failed surgical repair 12/12/18, Parkinson's disease, bladder cancer, HTN, scoliosis, lumbar spondylolisthesis cervical spondylosis, depression with anxiety    Examination-Activity Limitations Bathing;Dressing;Hygiene/Grooming;Lift;Carry;Reach Overhead;Sit;Stand;Locomotion Level    Examination-Participation Restrictions Church;Cleaning;Community Activity;Driving;Laundry;Meal Prep;Shop    Stability/Clinical Decision Making Evolving/Moderate complexity    Clinical Decision Making Moderate    Rehab Potential Good    PT Frequency  2x / week    PT Duration 8 weeks    PT Treatment/Interventions ADLs/Self Care Home Management;Cryotherapy;Electrical Stimulation;Iontophoresis 4mg /ml Dexamethasone;Moist Heat;Ultrasound;DME Instruction;Gait training;Stair training;Functional mobility training;Therapeutic activities;Therapeutic exercise;Balance training;Neuromuscular re-education;Patient/family education;Manual techniques;Passive range of motion;Dry needling;Taping;Vasopneumatic Device;Spinal Manipulations;Joint Manipulations    PT Next Visit Plan Complete assessment related to Parkinson's disease; review initial HEP and progress postural stretching and strengthening    PT Home Exercise Plan Access Code: [email or web address] (12/5)    Consulted and Agree with Plan of Care Patient             Patient will benefit from skilled therapeutic intervention in order to improve the following deficits and impairments:  Abnormal gait, Decreased activity tolerance, Decreased balance, Decreased knowledge of use of DME, Decreased mobility, Decreased range of motion, Decreased safety awareness, Decreased strength, Difficulty walking, Increased fascial restricitons, Increased muscle spasms, Impaired perceived functional ability, Impaired flexibility, Impaired UE functional use, Improper body mechanics, Postural dysfunction, Pain  Visit Diagnosis: Chronic left shoulder pain  Stiffness of left shoulder, not elsewhere classified  Cervicalgia  Pain in thoracic spine  Abnormal posture  Muscle weakness (generalized)  Unsteadiness on feet  Other abnormalities of gait and mobility     Problem List Patient Active Problem List   Diagnosis Date Noted   Left shoulder pain 09/14/2021   COVID-19 01/06/2021   Diarrhea 09/03/2020   Vitamin D deficiency 09/03/2020   OA (osteoarthritis) of knee 07/30/2019   Tinea corporis 06/08/2019   Psoriatic arthritis ([place]) 04/02/2019   Anemia 03/13/2018   Hot flashes 03/13/2018   S/P cervical spinal fusion  03/31/2017   Neck pain 12/12/2016   Depression with anxiety 10/17/2016   Bladder cancer ([place]) 10/17/2016   OA (osteoarthritis) of hip 07/07/2016   Spinal stenosis of lumbar region with radiculopathy 04/08/2016   Spondylolisthesis of lumbar region 04/08/2016   Spondylosis of cervical region without myelopathy or radiculopathy 04/08/2016   Preventative health care 01/18/2016   Low back pain 01/18/2016   History of chicken pox    Hyperlipidemia 12/15/2014   Allergic rhinitis 06/25/2014   Allergy to bee sting 06/10/2014   TMJ tenderness 11/16/2013  Other malaise and fatigue 05/07/2013   Arthropathy of cervical spine ([place]) 11/27/2012   Muscle spasms of neck 11/27/2012   Parkinson's disease ([place]) 05/24/2012   Conjunctivochalasis 03/09/2012   Epiretinal membrane 03/09/2012   Hyperopia with astigmatism and presbyopia 03/09/2012   Pseudophakia 03/09/2012   HTN (hypertension) 08/31/2011   Right knee pain 08/31/2011   Benign hypertensive heart disease without heart failure 05/12/2011    Percival Spanish, PT 10/19/2021, 7:43 PM  [facility] [address]  [place], [place], [address] Phone: [phone]   Fax:  [phone]  Name: RAYAAN LORAH MRN: [id number] Date of Birth: 1932/03/24

## 2021-10-22 ENCOUNTER — Other Ambulatory Visit: Payer: Self-pay

## 2021-10-22 ENCOUNTER — Encounter: Payer: Self-pay | Admitting: Physical Therapy

## 2021-10-22 ENCOUNTER — Ambulatory Visit: Payer: Medicare Other | Admitting: Physical Therapy

## 2021-10-22 DIAGNOSIS — M6281 Muscle weakness (generalized): Secondary | ICD-10-CM

## 2021-10-22 DIAGNOSIS — C672 Malignant neoplasm of lateral wall of bladder: Secondary | ICD-10-CM | POA: Diagnosis not present

## 2021-10-22 DIAGNOSIS — G8929 Other chronic pain: Secondary | ICD-10-CM | POA: Diagnosis not present

## 2021-10-22 DIAGNOSIS — M546 Pain in thoracic spine: Secondary | ICD-10-CM

## 2021-10-22 DIAGNOSIS — M25512 Pain in left shoulder: Secondary | ICD-10-CM | POA: Diagnosis not present

## 2021-10-22 DIAGNOSIS — M25612 Stiffness of left shoulder, not elsewhere classified: Secondary | ICD-10-CM | POA: Diagnosis not present

## 2021-10-22 DIAGNOSIS — R2681 Unsteadiness on feet: Secondary | ICD-10-CM

## 2021-10-22 DIAGNOSIS — R2689 Other abnormalities of gait and mobility: Secondary | ICD-10-CM

## 2021-10-22 DIAGNOSIS — M542 Cervicalgia: Secondary | ICD-10-CM

## 2021-10-22 DIAGNOSIS — R293 Abnormal posture: Secondary | ICD-10-CM

## 2021-10-22 DIAGNOSIS — C641 Malignant neoplasm of right kidney, except renal pelvis: Secondary | ICD-10-CM | POA: Diagnosis not present

## 2021-10-22 NOTE — Therapy (Signed)
[facility] [address]  [place], [place], [address] Phone: [phone]   Fax:  [phone]  Physical Therapy Treatment  Patient Details  Name: Stephanie [name] MRN: [id number] Date of Birth: 1931-12-09 Referring Provider (PT): Penni Homans, MD   Encounter Date: 10/22/2021   PT End of Session - 10/22/21 1445     Visit Number 2    Number of Visits 16    Date for PT Re-Evaluation 12/14/21    Authorization Type Medicare & BCBS    PT Start Time 8841    PT Stop Time 1528    PT Time Calculation (min) 43 min    Activity Tolerance Patient tolerated treatment well    Behavior During Therapy [facility] for tasks assessed/performed             Past Medical History:  Diagnosis Date   Allergy    Anemia    past hx    Arthropathy of cervical spine 11/27/2012   Right  Xray at [facility]  On 04/08/2016  AP, lateral, and lateral flexion and extension views of the cervical spine are submitted for evaluation.   No acute fracture identified in the cervical spine.   There is redemonstration of approximately 2 mm of C3 on C4 anterolisthesis in neutral which slightly increases in flexion relative to neutral and extension. Slight C5 on C6 retrolisthesis does not change i   Benign hypertension without congestive heart failure    Benign mole    right hcets mole, bleeds when dried with towel   Bilateral dry eyes    Bladder cancer ([place]) 2016/10/21   Cervical spondylosis    Cervicalgia    2 screws in place    Chronic kidney disease    kidney stones    Depression with anxiety 2016/10/21   prior to husbands death    Gross hematuria    developed after first hip replacment, led to indicental finding of bladder tumor, bleeding now resolved    History of kidney stones    1970's   Left hand weakness    due to reinjury of flxor tendon s/p tendon surgery march 12-2018   Lumbar stenosis    Malignant tumor of urinary bladder ([place]) 07/2016   Pre-stage I    OA (osteoarthritis)    Parkinson disease [facility]) dx 2012   neurologist-  dr siddiqui at [facility])     Dr Trudie Reed , [place] [facility]    Rupture of flexor tendon of hand 2020   right    Past Surgical History:  Procedure Laterality Date   BIOPSY MASS LEFT FIRST METACARPAL   06/23/2006   benign   CATARACT EXTRACTION W/ INTRAOCULAR LENS  IMPLANT, BILATERAL  1999 and 2003   CERVICAL FUSION  02/2017   c1-c2 ; reports she has 2 screws 2in long in place done at [facility] ; patient exhibits VERY LIMITED NECK ROM   COLONOSCOPY     CYSTOSCOPY W/ RETROGRADES Bilateral 09/22/2016   Procedure: CYSTOSCOPY WITH RETROGRADE PYELOGRAM;  Surgeon: Alexis Frock, MD;  Location: [facility];  Service: Urology;  Laterality: Bilateral;   D & C HYSTEROSCOPY W/ RESECTION POLYP  07/11/2000   DILATION AND CURETTAGE OF UTERUS  1960's   EXCISION CYST AND DEBRIDEMENT RIGHT WIRST AND REMOVAL FORGEIGN BODY  01/09/2009   FLEXOR TENDON REPAIR Left 12/12/2018   Procedure: REPAIR/TRANSFER FLEXOR DIGITORUM PROFUNDUS OF LEFT SMALL FINGER;  Surgeon: Daryll Brod, MD;  Location: [facility];  Service: Orthopedics;  Laterality: Left;   LAPAROSCOPY  yrs ago   infertility    METACARPOPHALANGEAL JOINT ARTHRODESIS Right 05/25/1996   TENDON REPAIR Left 01/15/2019   Hand   TONSILLECTOMY AND ADENOIDECTOMY  child   TOTAL HIP ARTHROPLASTY Left 07/07/2016   Procedure: LEFT TOTAL HIP ARTHROPLASTY ANTERIOR APPROACH;  Surgeon: Gaynelle Arabian, MD;  Location: [facility];  Service: Orthopedics;  Laterality: Left;   TOTAL HIP ARTHROPLASTY Right 09/28/2017   Procedure: RIGHT TOTAL HIP ARTHROPLASTY ANTERIOR APPROACH;  Surgeon: Gaynelle Arabian, MD;  Location: [facility];  Service: Orthopedics;  Laterality: Right;   TOTAL KNEE ARTHROPLASTY Right 07/30/2019   Procedure: TOTAL KNEE ARTHROPLASTY;  Surgeon: Gaynelle Arabian, MD;  Location: [facility];  Service: Orthopedics;  Laterality: Right;  65min    TRANSURETHRAL RESECTION OF BLADDER TUMOR N/A 09/22/2016   Procedure: TRANSURETHRAL RESECTION OF BLADDER TUMOR (TURBT);  Surgeon: Alexis Frock, MD;  Location: [facility];  Service: Urology;  Laterality: N/A;    There were no vitals filed for this visit.   Subjective Assessment - 10/22/21 1448     Subjective Pt feels that the initial exercises seem to be helping. Pt reports she woke up very achy this morning which has lessened as the day has progressed but her L ring finger has been very painful from her psoriatic OA.    Pertinent History R TKA 07/2019, B THR 2018 & 2017, posterior C1-2 cervical fusion 2018, rupture of flexor tendon of L hand with failed surgical repair 2020, Parkinson's, bladder cancer, HTN    Patient Stated Goals "less pain - able to dress upper body or put on a coat w/o pain"    Currently in Pain? Yes    Pain Score 2     Pain Location Shoulder    Pain Orientation Left;Upper;Lateral    Pain Descriptors / Indicators Aching    Pain Score 0    Pain Location Neck    Pain Score 0   up to 10/10 with any movement   Pain Location Finger (Comment which one)    Pain Orientation Left   ring finger   Pain Descriptors / Indicators Shooting;Stabbing    Pain Type Acute pain    Aggravating Factors  holding or lifting objects    Pain Relieving Factors heat                [facility] PT Assessment - 10/22/21 1445       Posture/Postural Control   Posture Comments R lateral trunk shift/L hip shift  in standing d/t scoliosis      Ambulation/Gait   Assistive device None    Gait Pattern Step-through pattern;Decreased trunk rotation;Lateral trunk lean to right;Decreased arm swing - right;Decreased arm swing - left    Ambulation Surface Level;Indoor    Gait velocity 3.49 ft/sec      Standardized Balance Assessment   Standardized Balance Assessment Berg Balance Test;Timed Up and Go Test;Five Times Sit to Stand;10 meter walk test    Five times sit to stand comments   12.15 sec    10 Meter Walk 9.40 sec      Berg Balance Test   Sit to Stand Able to stand without using hands and stabilize independently    Standing Unsupported Able to stand safely 2 minutes    Sitting with Back Unsupported but Feet Supported on Floor or Stool Able to sit safely and securely 2 minutes    Stand to Sit Sits safely with  minimal use of hands    Transfers Able to transfer safely, minor use of hands    Standing Unsupported with Eyes Closed Able to stand 10 seconds safely    Standing Unsupported with Feet Together Able to place feet together independently and stand 1 minute safely    From Standing, Reach Forward with Outstretched Arm Can reach forward >12 cm safely (5")    From Standing Position, Pick up Object from Floor Able to pick up shoe, needs supervision    From Standing Position, Turn to Look Behind Over each Shoulder Looks behind one side only/other side shows less weight shift    Turn 360 Degrees Able to turn 360 degrees safely in 4 seconds or less    Standing Unsupported, Alternately Place Feet on Step/Stool Able to stand independently and safely and complete 8 steps in 20 seconds    Standing Unsupported, One Foot in Front Able to plae foot ahead of the other independently and hold 30 seconds    Standing on One Leg Able to lift leg independently and hold > 10 seconds   only on R foot, <3 sec on L foot   Total Score 52    Berg comment: 52-55 lower risk for falls (> 25%)      Timed Up and Go Test   Normal TUG (seconds) 10.12    Manual TUG (seconds) 10.12    Cognitive TUG (seconds) 11.16      Functional Gait  Assessment   Gait assessed  Yes    Gait Level Surface Walks 20 ft in less than 7 sec but greater than 5.5 sec, uses assistive device, slower speed, mild gait deviations, or deviates 6-10 in outside of the 12 in walkway width.    Change in Gait Speed Able to smoothly change walking speed without loss of balance or gait deviation. Deviate no more than 6 in outside of  the 12 in walkway width.    Gait with Horizontal Head Turns Performs head turns smoothly with slight change in gait velocity (eg, minor disruption to smooth gait path), deviates 6-10 in outside 12 in walkway width, or uses an assistive device.    Gait with Vertical Head Turns Performs head turns with no change in gait. Deviates no more than 6 in outside 12 in walkway width.    Gait and Pivot Turn Pivot turns safely within 3 sec and stops quickly with no loss of balance.    Step Over Obstacle Is able to step over one shoe box (4.5 in total height) without changing gait speed. No evidence of imbalance.    Gait with Narrow Base of Support Ambulates 4-7 steps.    Gait with Eyes Closed Walks 20 ft, slow speed, abnormal gait pattern, evidence for imbalance, deviates 10-15 in outside 12 in walkway width. Requires more than 9 sec to ambulate 20 ft.    Ambulating Backwards Walks 20 ft, slow speed, abnormal gait pattern, evidence for imbalance, deviates 10-15 in outside 12 in walkway width.    Steps Alternating feet, must use rail.    Total Score 20    FGA comment: 19-24 = medium risk fall                           [facility] Adult PT Treatment/Exercise - 10/22/21 1445       Neck Exercises: Seated   Shoulder Rolls Backwards;10 reps    Shoulder Rolls Limitations cues for upright posture, esp cervical retraction  Shoulder Exercises: Seated   Retraction Both;10 reps;AROM;Strengthening    Retraction Limitations + slight depression   cues for upright posture, esp cervical retraction, avoiding posterior trunk lean                      PT Short Term Goals - 10/22/21 1528       PT SHORT TERM GOAL #1   Title Patient will be independent with initial HEP    Status On-going    Target Date 11/16/21      PT SHORT TERM GOAL #2   Title Patient will demonstrate decreased tightness/spasm in the L upper trap/ levator scapulae to decrease pain to </= 5/10 with movement and promote  improved cervical and L shoulder mobility    Status On-going    Target Date 11/16/21      PT SHORT TERM GOAL #3   Title Patient will be able to verbalize and demonstrate proper posture with sitting/standing to decrease/reduce muscle tightness/reinjury    Status On-going    Target Date 11/16/21      PT SHORT TERM GOAL #4   Title Establish further STGs/LTGs as indicated based on assessment of deficits related to Parkinson's disease    Status Achieved   10/26/21              PT Long Term Goals - 10/22/21 1529       PT LONG TERM GOAL #1   Title Patient will be independent with ongoing/advanced HEP for self-management at home    Status On-going    Target Date 12/14/21      PT LONG TERM GOAL #2   Title Patient to improve L shoudler AROM to [facility] without pain provocation to allow for increased ease of UB dressing    Status On-going    Target Date 12/14/21      PT LONG TERM GOAL #3   Title Patient to improve cervical AROM by >/= 25% with pain dereased to </= 5/10 with movement to allow for increased participation in church services and improve safety with driving    Status On-going    Target Date 12/14/21      PT LONG TERM GOAL #4   Title Patient will improve FGA score to >/= 25/30 to improve gait stability and reduce risk for falls    Baseline 20/30    Status New    Target Date 12/14/21      PT LONG TERM GOAL #5   Title Patient will verbalize understanding of local Parkinson's disease resources.    Status New    Target Date 12/14/21                   Plan - 10/22/21 1528     Clinical Impression Statement Angell reports the initial HEP exercises seem to be helping her shoulder, but she is still having pain in her neck and shoulder, esp upon waking this morning, along with new severe pain in her L ring finger due to her psoriatic arthritis. Review of the initial HEP exercises requiring cues for cervical retraction to reduce fwd head as well as cues to avoid  posterior trunk lean with reverse shoulder rows and scapular retraction, but otherwise good movement patterns. Assessment of balance, gait and movement patterns related to her Parkinson's disease revealing only a low fall risk with more stationary activities per Berg score of 52/56 but moderate fall risk during dynamic gait activities per FGA score of 20/30, with remaining standardized  tests (5xSTS & TUG) below the fall risk thresholds. Based on results and patient's preference, will focus initial therapy efforts on L shoulder and neck pain to promote increased ease of ADLs, eventually incorporating PWR! moves and balance activities to facilitate improved mobility and balance.    Comorbidities R TKA 07/2019, B THR 2018 & 2017, cervical fusion 02/23/17 (Posterior cervical approach for a C2 neurectomy on the right side, posterior fusion C1 and C2 with structural allograft and morselized allograft, transarticular screw fixation), h/o L frozen shoulder per pt report, rupture of flexor tendon of L hand with failed surgical repair 12/12/18, Parkinson's disease, bladder cancer, HTN, scoliosis, lumbar spondylolisthesis cervical spondylosis, depression with anxiety    Rehab Potential Good    PT Frequency 2x / week    PT Duration 8 weeks    PT Treatment/Interventions ADLs/Self Care Home Management;Cryotherapy;Electrical Stimulation;Iontophoresis 4mg /ml Dexamethasone;Moist Heat;Ultrasound;DME Instruction;Gait training;Stair training;Functional mobility training;Therapeutic activities;Therapeutic exercise;Balance training;Neuromuscular re-education;Patient/family education;Manual techniques;Passive range of motion;Dry needling;Taping;Vasopneumatic Device;Spinal Manipulations;Joint Manipulations    PT Next Visit Plan review initial HEP and progress postural stretching and strengthening    PT Home Exercise Plan Access Code: [email or web address] (12/5)    Consulted and Agree with Plan of Care Patient             Patient will  benefit from skilled therapeutic intervention in order to improve the following deficits and impairments:  Abnormal gait, Decreased activity tolerance, Decreased balance, Decreased knowledge of use of DME, Decreased mobility, Decreased range of motion, Decreased safety awareness, Decreased strength, Difficulty walking, Increased fascial restricitons, Increased muscle spasms, Impaired perceived functional ability, Impaired flexibility, Impaired UE functional use, Improper body mechanics, Postural dysfunction, Pain  Visit Diagnosis: Chronic left shoulder pain  Stiffness of left shoulder, not elsewhere classified  Cervicalgia  Pain in thoracic spine  Abnormal posture  Muscle weakness (generalized)  Unsteadiness on feet  Other abnormalities of gait and mobility     Problem List Patient Active Problem List   Diagnosis Date Noted   Left shoulder pain 09/14/2021   COVID-19 01/06/2021   Diarrhea 09/03/2020   Vitamin D deficiency 09/03/2020   OA (osteoarthritis) of knee 07/30/2019   Tinea corporis 06/08/2019   Psoriatic arthritis ([place]) 04/02/2019   Anemia 03/13/2018   Hot flashes 03/13/2018   S/P cervical spinal fusion 03/31/2017   Neck pain 12/12/2016   Depression with anxiety 10/17/2016   Bladder cancer ([place]) 10/17/2016   OA (osteoarthritis) of hip 07/07/2016   Spinal stenosis of lumbar region with radiculopathy 04/08/2016   Spondylolisthesis of lumbar region 04/08/2016   Spondylosis of cervical region without myelopathy or radiculopathy 04/08/2016   Preventative health care 01/18/2016   Low back pain 01/18/2016   History of chicken pox    Hyperlipidemia 12/15/2014   Allergic rhinitis 06/25/2014   Allergy to bee sting 06/10/2014   TMJ tenderness 11/16/2013   Other malaise and fatigue 05/07/2013   Arthropathy of cervical spine ([place]) 11/27/2012   Muscle spasms of neck 11/27/2012   Parkinson's disease ([place]) 05/24/2012   Conjunctivochalasis 03/09/2012   Epiretinal membrane  03/09/2012   Hyperopia with astigmatism and presbyopia 03/09/2012   Pseudophakia 03/09/2012   HTN (hypertension) 08/31/2011   Right knee pain 08/31/2011   Benign hypertensive heart disease without heart failure 05/12/2011    Percival Spanish, PT 10/22/2021, 6:48 PM  [facility] [address]  [place], [place], [address] Phone: [phone]   Fax:  [phone]  Name: DANNIELL ROTUNDO MRN: [id number]  Date of Birth: 26-Jan-1932

## 2021-10-26 ENCOUNTER — Other Ambulatory Visit: Payer: Self-pay

## 2021-10-26 ENCOUNTER — Ambulatory Visit: Payer: Medicare Other | Admitting: Physical Therapy

## 2021-10-26 DIAGNOSIS — M542 Cervicalgia: Secondary | ICD-10-CM | POA: Diagnosis not present

## 2021-10-26 DIAGNOSIS — R293 Abnormal posture: Secondary | ICD-10-CM | POA: Diagnosis not present

## 2021-10-26 DIAGNOSIS — R2681 Unsteadiness on feet: Secondary | ICD-10-CM

## 2021-10-26 DIAGNOSIS — M25612 Stiffness of left shoulder, not elsewhere classified: Secondary | ICD-10-CM | POA: Diagnosis not present

## 2021-10-26 DIAGNOSIS — M6281 Muscle weakness (generalized): Secondary | ICD-10-CM

## 2021-10-26 DIAGNOSIS — M546 Pain in thoracic spine: Secondary | ICD-10-CM | POA: Diagnosis not present

## 2021-10-26 DIAGNOSIS — G8929 Other chronic pain: Secondary | ICD-10-CM

## 2021-10-26 DIAGNOSIS — M25512 Pain in left shoulder: Secondary | ICD-10-CM | POA: Diagnosis not present

## 2021-10-26 DIAGNOSIS — R2689 Other abnormalities of gait and mobility: Secondary | ICD-10-CM

## 2021-10-26 NOTE — Therapy (Signed)
[facility] [address]  [place], [place], [address] Phone: [phone]   Fax:  [phone]  Physical Therapy Treatment  Patient Details  Name: Stephanie [name] MRN: [id number] Date of Birth: March 08, 1932 Referring Provider (PT): Penni Homans, MD   Encounter Date: 10/26/2021   PT End of Session - 10/26/21 1445     Visit Number 3    Number of Visits 16    Date for PT Re-Evaluation 12/14/21    Authorization Type Medicare & BCBS    PT Start Time 1445    PT Stop Time 1529    PT Time Calculation (min) 44 min    Activity Tolerance Patient tolerated treatment well    Behavior During Therapy [facility] for tasks assessed/performed             Past Medical History:  Diagnosis Date   Allergy    Anemia    past hx    Arthropathy of cervical spine 11/27/2012   Right  Xray at [facility]  On 04/08/2016  AP, lateral, and lateral flexion and extension views of the cervical spine are submitted for evaluation.   No acute fracture identified in the cervical spine.   There is redemonstration of approximately 2 mm of C3 on C4 anterolisthesis in neutral which slightly increases in flexion relative to neutral and extension. Slight C5 on C6 retrolisthesis does not change i   Benign hypertension without congestive heart failure    Benign mole    right hcets mole, bleeds when dried with towel   Bilateral dry eyes    Bladder cancer ([place]) 11-10-16   Cervical spondylosis    Cervicalgia    2 screws in place    Chronic kidney disease    kidney stones    Depression with anxiety 2016-11-10   prior to husbands death    Gross hematuria    developed after first hip replacment, led to indicental finding of bladder tumor, bleeding now resolved    History of kidney stones    1970's   Left hand weakness    due to reinjury of flxor tendon s/p tendon surgery march 12-2018   Lumbar stenosis    Malignant tumor of urinary bladder ([place]) 07/2016   Pre-stage I    OA (osteoarthritis)    Parkinson disease [facility]) dx 2012   neurologist-  dr siddiqui at [place] [facility])     Dr Trudie Reed , [place] [facility]    Rupture of flexor tendon of hand 2020   right    Past Surgical History:  Procedure Laterality Date   BIOPSY MASS LEFT FIRST METACARPAL   06/23/2006   benign   CATARACT EXTRACTION W/ INTRAOCULAR LENS  IMPLANT, BILATERAL  1999 and 2003   CERVICAL FUSION  02/2017   c1-c2 ; reports she has 2 screws 2in long in place done at [facility] ; patient exhibits VERY LIMITED NECK ROM   COLONOSCOPY     CYSTOSCOPY W/ RETROGRADES Bilateral 09/22/2016   Procedure: CYSTOSCOPY WITH RETROGRADE PYELOGRAM;  Surgeon: Alexis Frock, MD;  Location[facility];  Service: Urology;  Laterality: Bilateral;   D & C HYSTEROSCOPY W/ RESECTION POLYP  07/11/2000   DILATION AND CURETTAGE OF UTERUS  1960's   EXCISION CYST AND DEBRIDEMENT RIGHT WIRST AND REMOVAL FORGEIGN BODY  01/09/2009   FLEXOR TENDON REPAIR Left 12/12/2018   Procedure: REPAIR/TRANSFER FLEXOR DIGITORUM PROFUNDUS OF LEFT SMALL FINGER;  Surgeon: Daryll Brod, MD;  Location: [employer];  Service: Orthopedics;  Laterality: Left;   LAPAROSCOPY  yrs ago   infertility    METACARPOPHALANGEAL JOINT ARTHRODESIS Right 05/25/1996   TENDON REPAIR Left 01/15/2019   Hand   TONSILLECTOMY AND ADENOIDECTOMY  child   TOTAL HIP ARTHROPLASTY Left 07/07/2016   Procedure: LEFT TOTAL HIP ARTHROPLASTY ANTERIOR APPROACH;  Surgeon: Gaynelle Arabian, MD;  Location: [facility];  Service: Orthopedics;  Laterality: Left;   TOTAL HIP ARTHROPLASTY Right 09/28/2017   Procedure: RIGHT TOTAL HIP ARTHROPLASTY ANTERIOR APPROACH;  Surgeon: Gaynelle Arabian, MD;  Location: [facility];  Service: Orthopedics;  Laterality: Right;   TOTAL KNEE ARTHROPLASTY Right 07/30/2019   Procedure: TOTAL KNEE ARTHROPLASTY;  Surgeon: Gaynelle Arabian, MD;  Location: [facility];  Service: Orthopedics;  Laterality: Right;  35min    TRANSURETHRAL RESECTION OF BLADDER TUMOR N/A 09/22/2016   Procedure: TRANSURETHRAL RESECTION OF BLADDER TUMOR (TURBT);  Surgeon: Alexis Frock, MD;  Location: [facility];  Service: Urology;  Laterality: N/A;    There were no vitals filed for this visit.   Subjective Assessment - 10/26/21 1452     Subjective Pt reports limited stamina/endurance which she thinks is due to her back as she has been told her heart is fine.    Pertinent History R TKA 07/2019, B THR 2018 & 2017, posterior C1-2 cervical fusion 2018, rupture of flexor tendon of L hand with failed surgical repair 2020, Parkinson's, bladder cancer, HTN    Patient Stated Goals "less pain - able to dress upper body or put on a coat w/o pain"    Currently in Pain? Yes    Pain Score 0-No pain   3-4/10 with activity   Pain Location Shoulder    Pain Orientation Left;Upper;Posterior    Pain Descriptors / Indicators Aching                               [facility] Adult PT Treatment/Exercise - 10/26/21 1445       Neck Exercises: Seated   Shoulder Rolls Backwards;10 reps    Shoulder Rolls Limitations cues for upright posture, esp cervical retraction      Neck Exercises: Stretches   Upper Trapezius Stretch Right;Left;2 reps;30 seconds    Upper Trapezius Stretch Limitations hand gripping edge of chair on R, holding towel draped over shoulder to keep shoulder down on L    Levator Stretch Right;Left;2 reps;30 seconds    Levator Stretch Limitations hand gripping edge of chair on R, holding towel draped over shoulder to keep shoulder down on L    Other Neck Stretches R/L single arm doorway pec stretch 2 x 30 sec      Knee/Hip Exercises: Aerobic   Nustep L3 x 6 min (UE/LE to promote reciprocal motion)      Shoulder Exercises: Seated   Retraction Both;10 reps;AROM;Strengthening    Retraction Limitations + slight depression   cues for upright posture, esp cervical retraction, avoiding posterior trunk lean      Shoulder Exercises: Standing   Extension Both;10 reps;Strengthening;Theraband    Theraband Level (Shoulder Extension) Level 1 (Yellow)    Extension Limitations handles attached to band for ease of grip    Row Both;10 reps;Strengthening;Theraband    Theraband Level (Shoulder Row) Level 1 (Yellow)    Row Limitations handles attached to band for ease of grip      Manual Therapy   Manual Therapy Soft tissue mobilization;Myofascial release  Soft tissue mobilization STM/DTM to L UT    Myofascial Release manual TPR to L UT - better tolerance for stretching following TPR                     PT Education - 10/26/21 1528     Education Details HEP update - pec stretch & yellow TB rows/retraction - Access Code: [email or web address]    Person(s) Educated Patient    Methods Explanation;Demonstration;Verbal cues;Tactile cues;Handout    Comprehension Verbalized understanding;Verbal cues required;Tactile cues required;Returned demonstration;Need further instruction              PT Short Term Goals - 10/22/21 1528       PT SHORT TERM GOAL #1   Title Patient will be independent with initial HEP    Status On-going    Target Date 11/16/21      PT SHORT TERM GOAL #2   Title Patient will demonstrate decreased tightness/spasm in the L upper trap/ levator scapulae to decrease pain to </= 5/10 with movement and promote improved cervical and L shoulder mobility    Status On-going    Target Date 11/16/21      PT SHORT TERM GOAL #3   Title Patient will be able to verbalize and demonstrate proper posture with sitting/standing to decrease/reduce muscle tightness/reinjury    Status On-going    Target Date 11/16/21      PT SHORT TERM GOAL #4   Title Establish further STGs/LTGs as indicated based on assessment of deficits related to Parkinson's disease    Status Achieved   10/26/21              PT Long Term Goals - 10/26/21 1529       PT LONG TERM GOAL #1   Title Patient will be  independent with ongoing/advanced HEP for self-management at home    Status On-going    Target Date 12/14/21      PT LONG TERM GOAL #2   Title Patient to improve L shoudler AROM to [facility] without pain provocation to allow for increased ease of UB dressing    Status On-going    Target Date 12/14/21      PT LONG TERM GOAL #3   Title Patient to improve cervical AROM by >/= 25% with pain dereased to </= 5/10 with movement to allow for increased participation in church services and improve safety with driving    Status On-going    Target Date 12/14/21      PT LONG TERM GOAL #4   Title Patient will improve FGA score to >/= 25/30 to improve gait stability and reduce risk for falls    Baseline 20/30    Status On-going    Target Date 12/14/21      PT LONG TERM GOAL #5   Title Patient will verbalize understanding of local Parkinson's disease resources.    Status On-going    Target Date 12/14/21                   Plan - 10/26/21 1758     Clinical Impression Statement Sarayah continues to require cues to avoid posterior trunk lean with unsupported seated HEP exercises, therefore instructed her in alternative options for performance including seated in chair vs standing with back against inside of doorframe to promote scapular and cervical retraction. Attempted to address limited cervical ROM and upper shoulder tightness but pt limited by pain in L UT. MT for STM and TPR able to  reduce muscle tension to improve tolerance for stretches but will require further training before adding UT and LS stretches to HEP. Better tolerance noted for pec stretches and addition of yellow TB resistance to scapular strengthening, therefore HEP updated accordingly.    Comorbidities R TKA 07/2019, B THR 2018 & 2017, cervical fusion 02/23/17 (Posterior cervical approach for a C2 neurectomy on the right side, posterior fusion C1 and C2 with structural allograft and morselized allograft, transarticular screw fixation),  h/o L frozen shoulder per pt report, rupture of flexor tendon of L hand with failed surgical repair 12/12/18, Parkinson's disease, bladder cancer, HTN, scoliosis, lumbar spondylolisthesis cervical spondylosis, depression with anxiety    Rehab Potential Good    PT Frequency 2x / week    PT Duration 8 weeks    PT Treatment/Interventions ADLs/Self Care Home Management;Cryotherapy;Electrical Stimulation;Iontophoresis 4mg /ml Dexamethasone;Moist Heat;Ultrasound;DME Instruction;Gait training;Stair training;Functional mobility training;Therapeutic activities;Therapeutic exercise;Balance training;Neuromuscular re-education;Patient/family education;Manual techniques;Passive range of motion;Dry needling;Taping;Vasopneumatic Device;Spinal Manipulations;Joint Manipulations    PT Next Visit Plan progress postural stretching and strengthening along with cervical and shoulder AROM; review and update HEP PRN    PT Home Exercise Plan Access Code: [email or web address] (12/5, updated 12/12)    Consulted and Agree with Plan of Care Patient             Patient will benefit from skilled therapeutic intervention in order to improve the following deficits and impairments:  Abnormal gait, Decreased activity tolerance, Decreased balance, Decreased knowledge of use of DME, Decreased mobility, Decreased range of motion, Decreased safety awareness, Decreased strength, Difficulty walking, Increased fascial restricitons, Increased muscle spasms, Impaired perceived functional ability, Impaired flexibility, Impaired UE functional use, Improper body mechanics, Postural dysfunction, Pain  Visit Diagnosis: Chronic left shoulder pain  Stiffness of left shoulder, not elsewhere classified  Cervicalgia  Pain in thoracic spine  Abnormal posture  Muscle weakness (generalized)  Unsteadiness on feet  Other abnormalities of gait and mobility     Problem List Patient Active Problem List   Diagnosis Date Noted   Left shoulder pain  09/14/2021   COVID-19 01/06/2021   Diarrhea 09/03/2020   Vitamin D deficiency 09/03/2020   OA (osteoarthritis) of knee 07/30/2019   Tinea corporis 06/08/2019   Psoriatic arthritis ([place]) 04/02/2019   Anemia 03/13/2018   Hot flashes 03/13/2018   S/P cervical spinal fusion 03/31/2017   Neck pain 12/12/2016   Depression with anxiety 10/17/2016   Bladder cancer ([place]) 10/17/2016   OA (osteoarthritis) of hip 07/07/2016   Spinal stenosis of lumbar region with radiculopathy 04/08/2016   Spondylolisthesis of lumbar region 04/08/2016   Spondylosis of cervical region without myelopathy or radiculopathy 04/08/2016   Preventative health care 01/18/2016   Low back pain 01/18/2016   History of chicken pox    Hyperlipidemia 12/15/2014   Allergic rhinitis 06/25/2014   Allergy to bee sting 06/10/2014   TMJ tenderness 11/16/2013   Other malaise and fatigue 05/07/2013   Arthropathy of cervical spine ([place]) 11/27/2012   Muscle spasms of neck 11/27/2012   Parkinson's disease ([place]) 05/24/2012   Conjunctivochalasis 03/09/2012   Epiretinal membrane 03/09/2012   Hyperopia with astigmatism and presbyopia 03/09/2012   Pseudophakia 03/09/2012   HTN (hypertension) 08/31/2011   Right knee pain 08/31/2011   Benign hypertensive heart disease without heart failure 05/12/2011    Percival Spanish, PT 10/26/2021, 6:02 PM  [facility] [address]  [place], [place], [address] Phone: [phone]   Fax:  [phone]  Name: Krissa  MIRNA SUTCLIFFE MRN: [id number] Date of Birth: Mar 18, 1932

## 2021-10-26 NOTE — Patient Instructions (Signed)
    Access Code: [email or web address] URL: [email or web address] Date: 10/26/2021 Prepared by: Annie Paras  Exercises Shoulder Rolls in Sitting - 2-3 x daily - 7 x weekly - 2 sets - 10 reps Seated Scapular Retraction - 2-3 x daily - 7 x weekly - 2 sets - 10 reps - 5 sec hold Doorway Pec Stretch at 60 Degrees Abduction with Arm Straight - 2-3 x daily - 7 x weekly - 3 reps - 30 sec hold Standing Bilateral Low Shoulder Row with Anchored Resistance - 1 x daily - 7 x weekly - 2 sets - 10 reps - 5 sec hold Scapular Retraction with Resistance Advanced - 1 x daily - 7 x weekly - 2 sets - 10 reps - 5 sec hold

## 2021-10-29 ENCOUNTER — Other Ambulatory Visit: Payer: Self-pay

## 2021-10-29 ENCOUNTER — Encounter: Payer: Self-pay | Admitting: Physical Therapy

## 2021-10-29 ENCOUNTER — Ambulatory Visit: Payer: Medicare Other | Admitting: Physical Therapy

## 2021-10-29 DIAGNOSIS — G8929 Other chronic pain: Secondary | ICD-10-CM | POA: Diagnosis not present

## 2021-10-29 DIAGNOSIS — M25512 Pain in left shoulder: Secondary | ICD-10-CM | POA: Diagnosis not present

## 2021-10-29 DIAGNOSIS — M25612 Stiffness of left shoulder, not elsewhere classified: Secondary | ICD-10-CM | POA: Diagnosis not present

## 2021-10-29 DIAGNOSIS — M542 Cervicalgia: Secondary | ICD-10-CM | POA: Diagnosis not present

## 2021-10-29 DIAGNOSIS — R2689 Other abnormalities of gait and mobility: Secondary | ICD-10-CM

## 2021-10-29 DIAGNOSIS — M546 Pain in thoracic spine: Secondary | ICD-10-CM | POA: Diagnosis not present

## 2021-10-29 DIAGNOSIS — R2681 Unsteadiness on feet: Secondary | ICD-10-CM

## 2021-10-29 DIAGNOSIS — R293 Abnormal posture: Secondary | ICD-10-CM

## 2021-10-29 DIAGNOSIS — M6281 Muscle weakness (generalized): Secondary | ICD-10-CM

## 2021-10-29 NOTE — Patient Instructions (Signed)
° °  Access Code: [email or web address] URL: [email or web address] Date: 10/29/2021 Prepared by: Annie Paras  Exercises Shoulder Rolls in Sitting - 2-3 x daily - 7 x weekly - 2 sets - 10 reps Seated Scapular Retraction - 2-3 x daily - 7 x weekly - 2 sets - 10 reps - 5 sec hold Doorway Pec Stretch at 60 Degrees Abduction with Arm Straight - 2-3 x daily - 7 x weekly - 3 reps - 30 sec hold Standing Bilateral Low Shoulder Row with Anchored Resistance - 1 x daily - 7 x weekly - 2 sets - 10 reps - 5 sec hold Scapular Retraction with Resistance Advanced - 1 x daily - 7 x weekly - 2 sets - 10 reps - 5 sec hold Seated Gentle Upper Trapezius Stretch - 2-3 x daily - 7 x weekly - 3 reps - 30 sec hold Gentle Levator Scapulae Stretch - 2-3 x daily - 7 x weekly - 3 reps - 30 sec hold Seated Rhomboid Stretch - 2-3 x daily - 7 x weekly - 3 reps - 30 sec hold

## 2021-10-29 NOTE — Therapy (Signed)
[facility] [address]  [place], [place], [address] Phone: [phone]   Fax:  [phone]  Physical Therapy Treatment  Patient Details  Name: Stephanie [name] MRN: [id number] Date of Birth: 1932-03-17 Referring Provider (PT): Penni Homans, MD   Encounter Date: 10/29/2021   PT End of Session - 10/29/21 1444     Visit Number 4    Number of Visits 16    Date for PT Re-Evaluation 12/14/21    Authorization Type Medicare & BCBS    PT Start Time 1444    PT Stop Time 1528    PT Time Calculation (min) 44 min    Activity Tolerance Patient tolerated treatment well    Behavior During Therapy [facility] for tasks assessed/performed             Past Medical History:  Diagnosis Date   Allergy    Anemia    past hx    Arthropathy of cervical spine 11/27/2012   Right  Xray at [facility]  On 04/08/2016  AP, lateral, and lateral flexion and extension views of the cervical spine are submitted for evaluation.   No acute fracture identified in the cervical spine.   There is redemonstration of approximately 2 mm of C3 on C4 anterolisthesis in neutral which slightly increases in flexion relative to neutral and extension. Slight C5 on C6 retrolisthesis does not change i   Benign hypertension without congestive heart failure    Benign mole    right hcets mole, bleeds when dried with towel   Bilateral dry eyes    Bladder cancer ([place]) 10/30/16   Cervical spondylosis    Cervicalgia    2 screws in place    Chronic kidney disease    kidney stones    Depression with anxiety Oct 30, 2016   prior to husbands death    Gross hematuria    developed after first hip replacment, led to indicental finding of bladder tumor, bleeding now resolved    History of kidney stones    1970's   Left hand weakness    due to reinjury of flxor tendon s/p tendon surgery march 12-2018   Lumbar stenosis    Malignant tumor of urinary bladder ([place]) 07/2016   Pre-stage I    OA (osteoarthritis)    Parkinson disease [facility]) dx 2012   neurologist-  dr siddiqui at [place] [facility])     Dr Trudie Reed , [place] [facility]    Rupture of flexor tendon of hand 2020   right    Past Surgical History:  Procedure Laterality Date   BIOPSY MASS LEFT FIRST METACARPAL   06/23/2006   benign   CATARACT EXTRACTION W/ INTRAOCULAR LENS  IMPLANT, BILATERAL  1999 and 2003   CERVICAL FUSION  02/2017   c1-c2 ; reports she has 2 screws 2in long in place done at [facility] ; patient exhibits VERY LIMITED NECK ROM   COLONOSCOPY     CYSTOSCOPY W/ RETROGRADES Bilateral 09/22/2016   Procedure: CYSTOSCOPY WITH RETROGRADE PYELOGRAM;  Surgeon: Alexis Frock, MD;  Location: [facility];  Service: Urology;  Laterality: Bilateral;   D & C HYSTEROSCOPY W/ RESECTION POLYP  07/11/2000   DILATION AND CURETTAGE OF UTERUS  1960's   EXCISION CYST AND DEBRIDEMENT RIGHT WIRST AND REMOVAL FORGEIGN BODY  01/09/2009   FLEXOR TENDON REPAIR Left 12/12/2018   Procedure: REPAIR/TRANSFER FLEXOR DIGITORUM PROFUNDUS OF LEFT SMALL FINGER;  Surgeon: Daryll Brod, MD;  Location: [place];  Service: Orthopedics;  Laterality: Left;   LAPAROSCOPY  yrs ago   infertility    METACARPOPHALANGEAL JOINT ARTHRODESIS Right 05/25/1996   TENDON REPAIR Left 01/15/2019   Hand   TONSILLECTOMY AND ADENOIDECTOMY  child   TOTAL HIP ARTHROPLASTY Left 07/07/2016   Procedure: LEFT TOTAL HIP ARTHROPLASTY ANTERIOR APPROACH;  Surgeon: Gaynelle Arabian, MD;  Location: [facility];  Service: Orthopedics;  Laterality: Left;   TOTAL HIP ARTHROPLASTY Right 09/28/2017   Procedure: RIGHT TOTAL HIP ARTHROPLASTY ANTERIOR APPROACH;  Surgeon: Gaynelle Arabian, MD;  Location: [facility];  Service: Orthopedics;  Laterality: Right;   TOTAL KNEE ARTHROPLASTY Right 07/30/2019   Procedure: TOTAL KNEE ARTHROPLASTY;  Surgeon: Gaynelle Arabian, MD;  Location: [facility];  Service: Orthopedics;  Laterality: Right;  88min    TRANSURETHRAL RESECTION OF BLADDER TUMOR N/A 09/22/2016   Procedure: TRANSURETHRAL RESECTION OF BLADDER TUMOR (TURBT);  Surgeon: Alexis Frock, MD;  Location: [facility];  Service: Urology;  Laterality: N/A;    There were no vitals filed for this visit.   Subjective Assessment - 10/29/21 1449     Subjective Pt feels like the exercises have been helping, "if ever so slightly".    Pertinent History R TKA 07/2019, B THR 2018 & 2017, posterior C1-2 cervical fusion 2018, rupture of flexor tendon of L hand with failed surgical repair 2020, Parkinsons, bladder cancer, HTN    Patient Stated Goals "less pain - able to dress upper body or put on a coat w/o pain"    Currently in Pain? Yes    Pain Score 3    2-3/10   Pain Location Shoulder    Pain Orientation Left;Upper;Posterior    Pain Descriptors / Indicators Aching    Pain Type Chronic pain    Pain Score 3   2-3/10   Pain Location Neck    Pain Orientation Left    Pain Descriptors / Indicators Dull;Aching                               [facility] Adult PT Treatment/Exercise - 10/29/21 1444       Neck Exercises: Stretches   Upper Trapezius Stretch Right;Left;2 reps;30 seconds    Upper Trapezius Stretch Limitations hand gripping edge of chair on R, holding towel draped over shoulder to keep shoulder down on L    Levator Stretch Right;Left;2 reps;30 seconds    Levator Stretch Limitations hand gripping edge of chair on R, holding towel draped over shoulder to keep shoulder down on L    Other Neck Stretches R/L single arm doorway pec stretch 2 x 30 sec    Other Neck Stretches B rhomboid stretch with arms folded across chest rounding shoulders forward 2 x 30 sec      Knee/Hip Exercises: Aerobic   Nustep L3 x 6 min (UE/LE to promote reciprocal motion)      Manual Therapy   Manual Therapy Soft tissue mobilization;Myofascial release;Other (comment)    Soft tissue mobilization STM/DTM to L UT, LS, rhomboids & pecs     Myofascial Release manual TPR to L UT, LS & rhomboids    Other Manual Therapy Provided education in options for self-STM at home including placing a tennis ball in a sock to toss over shoulder and lean against a wall to massage/release periscapular muscles  PT Education - 10/29/21 1525     Education Details HEP update - UT, LS & rhomboid stretches - Access Code: [email or web address]    Person(s) Educated Patient    Methods Explanation;Demonstration;Verbal cues;Handout    Comprehension Verbalized understanding;Verbal cues required;Returned demonstration;Need further instruction              PT Short Term Goals - 10/22/21 1528       PT SHORT TERM GOAL #1   Title Patient will be independent with initial HEP    Status On-going    Target Date 11/16/21      PT SHORT TERM GOAL #2   Title Patient will demonstrate decreased tightness/spasm in the L upper trap/ levator scapulae to decrease pain to </= 5/10 with movement and promote improved cervical and L shoulder mobility    Status On-going    Target Date 11/16/21      PT SHORT TERM GOAL #3   Title Patient will be able to verbalize and demonstrate proper posture with sitting/standing to decrease/reduce muscle tightness/reinjury    Status On-going    Target Date 11/16/21      PT SHORT TERM GOAL #4   Title Establish further STGs/LTGs as indicated based on assessment of deficits related to Parkinson's disease    Status Achieved   10/26/21              PT Long Term Goals - 10/26/21 1529       PT LONG TERM GOAL #1   Title Patient will be independent with ongoing/advanced HEP for self-management at home    Status On-going    Target Date 12/14/21      PT LONG TERM GOAL #2   Title Patient to improve L shoudler AROM to [facility] without pain provocation to allow for increased ease of UB dressing    Status On-going    Target Date 12/14/21      PT LONG TERM GOAL #3   Title Patient to improve cervical AROM by  >/= 25% with pain dereased to </= 5/10 with movement to allow for increased participation in church services and improve safety with driving    Status On-going    Target Date 12/14/21      PT LONG TERM GOAL #4   Title Patient will improve FGA score to >/= 25/30 to improve gait stability and reduce risk for falls    Baseline 20/30    Status On-going    Target Date 12/14/21      PT LONG TERM GOAL #5   Title Patient will verbalize understanding of local Parkinson's disease resources.    Status On-going    Target Date 12/14/21                   Plan - 10/29/21 1528     Clinical Impression Statement Maizey feels that the stretches and exercises seem to be helping and denies need for review but cant remember which exercise had her looking at her armpit - clarified that this was the levator scapulae stretch which had not been included in the last HEP update due for modifications necessary for L side due to limited use of her hand. Reviewed the UT and LS stretches including modifications for L side and adding a rhomboid stretch due tightness and TPs identified in rhomboids as well and updated HEP accordingly at pts request. Further STM/TPR also performed to address the increased muscle tension and painful TPs with pt noting some relief following this. Provided education  in options for self-STM at home including placing a tennis ball in a sock to toss over shoulder and lean against a wall to massage/release periscapular muscles.    Comorbidities R TKA 07/2019, B THR 2018 & 2017, cervical fusion 02/23/17 (Posterior cervical approach for a C2 neurectomy on the right side, posterior fusion C1 and C2 with structural allograft and morselized allograft, transarticular screw fixation), h/o L frozen shoulder per pt report, rupture of flexor tendon of L hand with failed surgical repair 12/12/18, Parkinsons disease, bladder cancer, HTN, scoliosis, lumbar spondylolisthesis cervical spondylosis, depression  with anxiety    Rehab Potential Good    PT Frequency 2x / week    PT Duration 8 weeks    PT Treatment/Interventions ADLs/Self Care Home Management;Cryotherapy;Electrical Stimulation;Iontophoresis 4mg /ml Dexamethasone;Moist Heat;Ultrasound;DME Instruction;Gait training;Stair training;Functional mobility training;Therapeutic activities;Therapeutic exercise;Balance training;Neuromuscular re-education;Patient/family education;Manual techniques;Passive range of motion;Dry needling;Taping;Vasopneumatic Device;Spinal Manipulations;Joint Manipulations    PT Next Visit Plan progress postural stretching and strengthening along with cervical and shoulder AROM; review and update HEP PRN    PT Home Exercise Plan Access Code: [email or web address] (12/5, updated 12/12)    Consulted and Agree with Plan of Care Patient             Patient will benefit from skilled therapeutic intervention in order to improve the following deficits and impairments:  Abnormal gait, Decreased activity tolerance, Decreased balance, Decreased knowledge of use of DME, Decreased mobility, Decreased range of motion, Decreased safety awareness, Decreased strength, Difficulty walking, Increased fascial restricitons, Increased muscle spasms, Impaired perceived functional ability, Impaired flexibility, Impaired UE functional use, Improper body mechanics, Postural dysfunction, Pain  Visit Diagnosis: Chronic left shoulder pain  Stiffness of left shoulder, not elsewhere classified  Cervicalgia  Pain in thoracic spine  Abnormal posture  Muscle weakness (generalized)  Unsteadiness on feet  Other abnormalities of gait and mobility     Problem List Patient Active Problem List   Diagnosis Date Noted   Left shoulder pain 09/14/2021   COVID-19 01/06/2021   Diarrhea 09/03/2020   Vitamin D deficiency 09/03/2020   OA (osteoarthritis) of knee 07/30/2019   Tinea corporis 06/08/2019   Psoriatic arthritis ([place]) 04/02/2019   Anemia 03/13/2018    Hot flashes 03/13/2018   S/P cervical spinal fusion 03/31/2017   Neck pain 12/12/2016   Depression with anxiety 10/17/2016   Bladder cancer ([place]) 10/17/2016   OA (osteoarthritis) of hip 07/07/2016   Spinal stenosis of lumbar region with radiculopathy 04/08/2016   Spondylolisthesis of lumbar region 04/08/2016   Spondylosis of cervical region without myelopathy or radiculopathy 04/08/2016   Preventative health care 01/18/2016   Low back pain 01/18/2016   History of chicken pox    Hyperlipidemia 12/15/2014   Allergic rhinitis 06/25/2014   Allergy to bee sting 06/10/2014   TMJ tenderness 11/16/2013   Other malaise and fatigue 05/07/2013   Arthropathy of cervical spine ([place]) 11/27/2012   Muscle spasms of neck 11/27/2012   Parkinson's disease ([place]) 05/24/2012   Conjunctivochalasis 03/09/2012   Epiretinal membrane 03/09/2012   Hyperopia with astigmatism and presbyopia 03/09/2012   Pseudophakia 03/09/2012   HTN (hypertension) 08/31/2011   Right knee pain 08/31/2011   Benign hypertensive heart disease without heart failure 05/12/2011    Percival Spanish, PT 10/29/2021, 3:42 PM  [facility] [address]  [place], [place], [address] Phone: [phone]   Fax:  [phone]  Name: Stephanie [name] MRN: [id number] Date of Birth: 1932-05-08

## 2021-11-02 ENCOUNTER — Encounter: Payer: Self-pay | Admitting: Physical Therapy

## 2021-11-02 ENCOUNTER — Other Ambulatory Visit: Payer: Self-pay

## 2021-11-02 ENCOUNTER — Ambulatory Visit: Payer: Medicare Other | Admitting: Physical Therapy

## 2021-11-02 DIAGNOSIS — M542 Cervicalgia: Secondary | ICD-10-CM

## 2021-11-02 DIAGNOSIS — M546 Pain in thoracic spine: Secondary | ICD-10-CM | POA: Diagnosis not present

## 2021-11-02 DIAGNOSIS — M25512 Pain in left shoulder: Secondary | ICD-10-CM | POA: Diagnosis not present

## 2021-11-02 DIAGNOSIS — M6281 Muscle weakness (generalized): Secondary | ICD-10-CM

## 2021-11-02 DIAGNOSIS — M25612 Stiffness of left shoulder, not elsewhere classified: Secondary | ICD-10-CM | POA: Diagnosis not present

## 2021-11-02 DIAGNOSIS — R2689 Other abnormalities of gait and mobility: Secondary | ICD-10-CM

## 2021-11-02 DIAGNOSIS — L603 Nail dystrophy: Secondary | ICD-10-CM | POA: Diagnosis not present

## 2021-11-02 DIAGNOSIS — I739 Peripheral vascular disease, unspecified: Secondary | ICD-10-CM | POA: Diagnosis not present

## 2021-11-02 DIAGNOSIS — G8929 Other chronic pain: Secondary | ICD-10-CM | POA: Diagnosis not present

## 2021-11-02 DIAGNOSIS — R293 Abnormal posture: Secondary | ICD-10-CM | POA: Diagnosis not present

## 2021-11-02 DIAGNOSIS — R2681 Unsteadiness on feet: Secondary | ICD-10-CM

## 2021-11-02 DIAGNOSIS — B079 Viral wart, unspecified: Secondary | ICD-10-CM | POA: Diagnosis not present

## 2021-11-02 NOTE — Therapy (Signed)
[facility] [address]  [place], [place], [address] Phone: [phone]   Fax:  [phone]  Physical Therapy Treatment  Patient Details  Name: Stephanie [name] MRN: [id number] Date of Birth: 09-06-1932 Referring Provider (PT): Penni Homans, MD   Encounter Date: 11/02/2021   PT End of Session - 11/02/21 1446     Visit Number 5    Number of Visits 16    Date for PT Re-Evaluation 12/14/21    Authorization Type Medicare & BCBS    PT Start Time 1446    PT Stop Time 1530    PT Time Calculation (min) 44 min    Activity Tolerance Patient tolerated treatment well    Behavior During Therapy [facility] for tasks assessed/performed             Past Medical History:  Diagnosis Date   Allergy    Anemia    past hx    Arthropathy of cervical spine 11/27/2012   Right  Xray at [facility]  On 04/08/2016  AP, lateral, and lateral flexion and extension views of the cervical spine are submitted for evaluation.   No acute fracture identified in the cervical spine.   There is redemonstration of approximately 2 mm of C3 on C4 anterolisthesis in neutral which slightly increases in flexion relative to neutral and extension. Slight C5 on C6 retrolisthesis does not change i   Benign hypertension without congestive heart failure    Benign mole    right hcets mole, bleeds when dried with towel   Bilateral dry eyes    Bladder cancer ([place]) 2016/10/26   Cervical spondylosis    Cervicalgia    2 screws in place    Chronic kidney disease    kidney stones    Depression with anxiety October 26, 2016   prior to husbands death    Gross hematuria    developed after first hip replacment, led to indicental finding of bladder tumor, bleeding now resolved    History of kidney stones    1970's   Left hand weakness    due to reinjury of flxor tendon s/p tendon surgery march 12-2018   Lumbar stenosis    Malignant tumor of urinary bladder ([place]) 07/2016   Pre-stage I    OA (osteoarthritis)    Parkinson disease [facility]) dx 2012   neurologist-  dr siddiqui at [place] [facility])     Dr Trudie Reed , [facility]    Rupture of flexor tendon of hand 2020   right    Past Surgical History:  Procedure Laterality Date   BIOPSY MASS LEFT FIRST METACARPAL   06/23/2006   benign   CATARACT EXTRACTION W/ INTRAOCULAR LENS  IMPLANT, BILATERAL  1999 and 2003   CERVICAL FUSION  02/2017   c1-c2 ; reports she has 2 screws 2in long in place done at [facility] ; patient exhibits VERY LIMITED NECK ROM   COLONOSCOPY     CYSTOSCOPY W/ RETROGRADES Bilateral 09/22/2016   Procedure: CYSTOSCOPY WITH RETROGRADE PYELOGRAM;  Surgeon: Alexis Frock, MD;  Location: [facility];  Service: Urology;  Laterality: Bilateral;   D & C HYSTEROSCOPY W/ RESECTION POLYP  07/11/2000   DILATION AND CURETTAGE OF UTERUS  1960's   EXCISION CYST AND DEBRIDEMENT RIGHT WIRST AND REMOVAL FORGEIGN BODY  01/09/2009   FLEXOR TENDON REPAIR Left 12/12/2018   Procedure: REPAIR/TRANSFER FLEXOR DIGITORUM PROFUNDUS OF LEFT SMALL FINGER;  Surgeon: Daryll Brod, MD;  Location: [place];  Service: Orthopedics;  Laterality: Left;   LAPAROSCOPY  yrs ago   infertility    METACARPOPHALANGEAL JOINT ARTHRODESIS Right 05/25/1996   TENDON REPAIR Left 01/15/2019   Hand   TONSILLECTOMY AND ADENOIDECTOMY  child   TOTAL HIP ARTHROPLASTY Left 07/07/2016   Procedure: LEFT TOTAL HIP ARTHROPLASTY ANTERIOR APPROACH;  Surgeon: Gaynelle Arabian, MD;  Location: [facility];  Service: Orthopedics;  Laterality: Left;   TOTAL HIP ARTHROPLASTY Right 09/28/2017   Procedure: RIGHT TOTAL HIP ARTHROPLASTY ANTERIOR APPROACH;  Surgeon: Gaynelle Arabian, MD;  Location: [facility];  Service: Orthopedics;  Laterality: Right;   TOTAL KNEE ARTHROPLASTY Right 07/30/2019   Procedure: TOTAL KNEE ARTHROPLASTY;  Surgeon: Gaynelle Arabian, MD;  Location: [facility];  Service: Orthopedics;  Laterality: Right;  60min    TRANSURETHRAL RESECTION OF BLADDER TUMOR N/A 09/22/2016   Procedure: TRANSURETHRAL RESECTION OF BLADDER TUMOR (TURBT);  Surgeon: Alexis Frock, MD;  Location: [facility];  Service: Urology;  Laterality: N/A;    There were no vitals filed for this visit.   Subjective Assessment - 11/02/21 1448     Subjective Pt reports some increased pain with 2 of her HEP exercises and requested to review these today.    Pertinent History R TKA 07/2019, B THR 2018 & 2017, posterior C1-2 cervical fusion 2018, rupture of flexor tendon of L hand with failed surgical repair 2020, Parkinsons, bladder cancer, HTN    Patient Stated Goals "less pain - able to dress upper body or put on a coat w/o pain"    Currently in Pain? No/denies                               [facility] Adult PT Treatment/Exercise - 11/02/21 1446       Neck Exercises: Stretches   Upper Trapezius Stretch Right;Left;2 reps;30 seconds    Upper Trapezius Stretch Limitations R hand gripping edge of chair on R ot tucked under hip for R UT stretch, R holding towel draped over shoulder to keep shoulder down on L for L UT stretch (due to limited use of L hand)    Levator Stretch Right;Left;2 reps;30 seconds    Levator Stretch Limitations R hand gripping edge of chair on R ot tucked under hip for R LS stretch, R holding towel draped over shoulder to keep shoulder down on L for L LS stretch (due to limited use of L hand)      Knee/Hip Exercises: Aerobic   Nustep L4 x 6 min (UE/LE to promote reciprocal motion)      Shoulder Exercises: Seated   Retraction Both;10 reps;AROM;Strengthening    Retraction Limitations + slight depression; cues for upright posture, esp cervical retraction, avoiding posterior trunk lean   unable to reproduce the pain seh noted with home performance     Manual Therapy   Manual Therapy Soft tissue mobilization;Myofascial release;Passive ROM;Joint mobilization    Joint Mobilization gentle L 1st  rib inferior mobs    Soft tissue mobilization STM/DTM to B cervical paraspinals, L UT, LS, scalene & pecs    Myofascial Release manual TPR to L UT, LS & scalenes    Passive ROM gentle manual B UT and LS stretches                       PT Short Term Goals - 11/02/21 1450       PT SHORT TERM GOAL #  1   Title Patient will be independent with initial HEP    Status On-going    Target Date 11/16/21      PT SHORT TERM GOAL #2   Title Patient will demonstrate decreased tightness/spasm in the L upper trap/ levator scapulae to decrease pain to </= 5/10 with movement and promote improved cervical and L shoulder mobility    Status On-going    Target Date 11/16/21      PT SHORT TERM GOAL #3   Title Patient will be able to verbalize and demonstrate proper posture with sitting/standing to decrease/reduce muscle tightness/reinjury    Status On-going    Target Date 11/16/21      PT SHORT TERM GOAL #4   Title Establish further STGs/LTGs as indicated based on assessment of deficits related to Parkinson's disease    Status Achieved   10/26/21              PT Long Term Goals - 10/26/21 1529       PT LONG TERM GOAL #1   Title Patient will be independent with ongoing/advanced HEP for self-management at home    Status On-going    Target Date 12/14/21      PT LONG TERM GOAL #2   Title Patient to improve L shoudler AROM to [facility] without pain provocation to allow for increased ease of UB dressing    Status On-going    Target Date 12/14/21      PT LONG TERM GOAL #3   Title Patient to improve cervical AROM by >/= 25% with pain dereased to </= 5/10 with movement to allow for increased participation in church services and improve safety with driving    Status On-going    Target Date 12/14/21      PT LONG TERM GOAL #4   Title Patient will improve FGA score to >/= 25/30 to improve gait stability and reduce risk for falls    Baseline 20/30    Status On-going    Target Date 12/14/21       PT LONG TERM GOAL #5   Title Patient will verbalize understanding of local Parkinson's disease resources.    Status On-going    Target Date 12/14/21                   Plan - 11/02/21 1530     Clinical Impression Statement Stephanie [name] reports increased pain in her L shoulder when she attempts the seated scapular retraction and the R UT stretch at home - reviewed exercises/stretches and provided clarification of technique with improved tolerance noted for scap retraction, but still unable to perform UT stretch w/o increased pain. MT addressing ongoing increased muscle tension and taut bands/TPs in L UT, LS, scalenes and pecs followed by manual stretching and repeat attempt of active stretches by patient Stephanie [name] reporting improved tolerance for stretches following MT. HEP unchanged but pt instructed to defer any exercise or stretch that significantly increases her pain and to let PT know on next visit.    Comorbidities R TKA 07/2019, B THR 2018 & 2017, cervical fusion 02/23/17 (Posterior cervical approach for a C2 neurectomy on the right side, posterior fusion C1 and C2 with structural allograft and morselized allograft, transarticular screw fixation), h/o L frozen shoulder per pt report, rupture of flexor tendon of L hand with failed surgical repair 12/12/18, Parkinsons disease, bladder cancer, HTN, scoliosis, lumbar spondylolisthesis cervical spondylosis, depression with anxiety    Rehab Potential Good    PT  Frequency 2x / week    PT Duration 8 weeks    PT Treatment/Interventions ADLs/Self Care Home Management;Cryotherapy;Electrical Stimulation;Iontophoresis 4mg /ml Dexamethasone;Moist Heat;Ultrasound;DME Instruction;Gait training;Stair training;Functional mobility training;Therapeutic activities;Therapeutic exercise;Balance training;Neuromuscular re-education;Patient/family education;Manual techniques;Passive range of motion;Dry needling;Taping;Vasopneumatic Device;Spinal Manipulations;Joint  Manipulations    PT Next Visit Plan progress postural stretching and strengthening along with cervical and shoulder AROM; review and update HEP PRN    PT Home Exercise Plan Access Code: [email or web address] (12/5, updated 12/12)    Consulted and Agree with Plan of Care Patient             Patient will benefit from skilled therapeutic intervention in order to improve the following deficits and impairments:  Abnormal gait, Decreased activity tolerance, Decreased balance, Decreased knowledge of use of DME, Decreased mobility, Decreased range of motion, Decreased safety awareness, Decreased strength, Difficulty walking, Increased fascial restricitons, Increased muscle spasms, Impaired perceived functional ability, Impaired flexibility, Impaired UE functional use, Improper body mechanics, Postural dysfunction, Pain  Visit Diagnosis: Chronic left shoulder pain  Stiffness of left shoulder, not elsewhere classified  Cervicalgia  Pain in thoracic spine  Abnormal posture  Muscle weakness (generalized)  Unsteadiness on feet  Other abnormalities of gait and mobility     Problem List Patient Active Problem List   Diagnosis Date Noted   Left shoulder pain 09/14/2021   COVID-19 01/06/2021   Diarrhea 09/03/2020   Vitamin D deficiency 09/03/2020   OA (osteoarthritis) of knee 07/30/2019   Tinea corporis 06/08/2019   Psoriatic arthritis ([place]) 04/02/2019   Anemia 03/13/2018   Hot flashes 03/13/2018   S/P cervical spinal fusion 03/31/2017   Neck pain 12/12/2016   Depression with anxiety 10/17/2016   Bladder cancer ([place]) 10/17/2016   OA (osteoarthritis) of hip 07/07/2016   Spinal stenosis of lumbar region with radiculopathy 04/08/2016   Spondylolisthesis of lumbar region 04/08/2016   Spondylosis of cervical region without myelopathy or radiculopathy 04/08/2016   Preventative health care 01/18/2016   Low back pain 01/18/2016   History of chicken pox    Hyperlipidemia 12/15/2014   Allergic  rhinitis 06/25/2014   Allergy to bee sting 06/10/2014   TMJ tenderness 11/16/2013   Other malaise and fatigue 05/07/2013   Arthropathy of cervical spine ([place]) 11/27/2012   Muscle spasms of neck 11/27/2012   Parkinson's disease ([place]) 05/24/2012   Conjunctivochalasis 03/09/2012   Epiretinal membrane 03/09/2012   Hyperopia with astigmatism and presbyopia 03/09/2012   Pseudophakia 03/09/2012   HTN (hypertension) 08/31/2011   Right knee pain 08/31/2011   Benign hypertensive heart disease without heart failure 05/12/2011    Percival Spanish, PT 11/02/2021, 6:04 PM  [facility] [address]  [place], [place], [address] Phone: [phone]   Fax:  [phone]  Name: ARRYANA TOLLESON MRN: [id number] Date of Birth: 17-Jul-1932

## 2021-11-10 ENCOUNTER — Ambulatory Visit: Payer: Medicare Other | Admitting: Physical Therapy

## 2021-11-12 ENCOUNTER — Encounter: Payer: Self-pay | Admitting: Physical Therapy

## 2021-11-12 ENCOUNTER — Other Ambulatory Visit: Payer: Self-pay

## 2021-11-12 ENCOUNTER — Ambulatory Visit: Payer: Medicare Other | Admitting: Physical Therapy

## 2021-11-12 DIAGNOSIS — R2681 Unsteadiness on feet: Secondary | ICD-10-CM

## 2021-11-12 DIAGNOSIS — M546 Pain in thoracic spine: Secondary | ICD-10-CM

## 2021-11-12 DIAGNOSIS — R293 Abnormal posture: Secondary | ICD-10-CM

## 2021-11-12 DIAGNOSIS — M542 Cervicalgia: Secondary | ICD-10-CM

## 2021-11-12 DIAGNOSIS — L4050 Arthropathic psoriasis, unspecified: Secondary | ICD-10-CM | POA: Diagnosis not present

## 2021-11-12 DIAGNOSIS — M25612 Stiffness of left shoulder, not elsewhere classified: Secondary | ICD-10-CM

## 2021-11-12 DIAGNOSIS — M25512 Pain in left shoulder: Secondary | ICD-10-CM | POA: Diagnosis not present

## 2021-11-12 DIAGNOSIS — R2689 Other abnormalities of gait and mobility: Secondary | ICD-10-CM

## 2021-11-12 DIAGNOSIS — G8929 Other chronic pain: Secondary | ICD-10-CM | POA: Diagnosis not present

## 2021-11-12 DIAGNOSIS — M6281 Muscle weakness (generalized): Secondary | ICD-10-CM

## 2021-11-12 NOTE — Therapy (Signed)
[facility] [address]  [place], [place], [address] Phone: [phone]   Fax:  [phone]  Physical Therapy Treatment  Patient Details  Name: Stephanie [name] MRN: [id number] Date of Birth: Mar 26, 1932 Referring Provider (PT): Penni Homans, MD   Encounter Date: 11/12/2021   PT End of Session - 11/12/21 1445     Visit Number 6    Number of Visits 16    Date for PT Re-Evaluation 12/14/21    Authorization Type Medicare & BCBS    PT Start Time 6160    PT Stop Time 1549    PT Time Calculation (min) 64 min    Activity Tolerance Patient tolerated treatment well    Behavior During Therapy [facility] for tasks assessed/performed             Past Medical History:  Diagnosis Date   Allergy    Anemia    past hx    Arthropathy of cervical spine 11/27/2012   Right  Xray at [facility]  On 04/08/2016  AP, lateral, and lateral flexion and extension views of the cervical spine are submitted for evaluation.   No acute fracture identified in the cervical spine.   There is redemonstration of approximately 2 mm of C3 on C4 anterolisthesis in neutral which slightly increases in flexion relative to neutral and extension. Slight C5 on C6 retrolisthesis does not change i   Benign hypertension without congestive heart failure    Benign mole    right hcets mole, bleeds when dried with towel   Bilateral dry eyes    Bladder cancer ([place]) 11/10/16   Cervical spondylosis    Cervicalgia    2 screws in place    Chronic kidney disease    kidney stones    Depression with anxiety 2016/11/10   prior to husbands death    Gross hematuria    developed after first hip replacment, led to indicental finding of bladder tumor, bleeding now resolved    History of kidney stones    1970's   Left hand weakness    due to reinjury of flxor tendon s/p tendon surgery march 12-2018   Lumbar stenosis    Malignant tumor of urinary bladder ([place]) 07/2016   Pre-stage I    OA (osteoarthritis)    Parkinson disease [facility]) dx 2012   neurologist-  dr siddiqui at [place] [facility])     Dr Trudie Reed , [place] [facility]    Rupture of flexor tendon of hand 2020   right    Past Surgical History:  Procedure Laterality Date   BIOPSY MASS LEFT FIRST METACARPAL   06/23/2006   benign   CATARACT EXTRACTION W/ INTRAOCULAR LENS  IMPLANT, BILATERAL  1999 and 2003   CERVICAL FUSION  02/2017   c1-c2 ; reports she has 2 screws 2in long in place done at [facility] ; patient exhibits VERY LIMITED NECK ROM   COLONOSCOPY     CYSTOSCOPY W/ RETROGRADES Bilateral 09/22/2016   Procedure: CYSTOSCOPY WITH RETROGRADE PYELOGRAM;  Surgeon: Alexis Frock, MD;  Location: [facility];  Service: Urology;  Laterality: Bilateral;   D & C HYSTEROSCOPY W/ RESECTION POLYP  07/11/2000   DILATION AND CURETTAGE OF UTERUS  1960's   EXCISION CYST AND DEBRIDEMENT RIGHT WIRST AND REMOVAL FORGEIGN BODY  01/09/2009   FLEXOR TENDON REPAIR Left 12/12/2018   Procedure: REPAIR/TRANSFER FLEXOR DIGITORUM PROFUNDUS OF LEFT SMALL FINGER;  Surgeon: Daryll Brod, MD;  Location: [place];  Service: Orthopedics;  Laterality: Left;   LAPAROSCOPY  yrs ago   infertility    METACARPOPHALANGEAL JOINT ARTHRODESIS Right 05/25/1996   TENDON REPAIR Left 01/15/2019   Hand   TONSILLECTOMY AND ADENOIDECTOMY  child   TOTAL HIP ARTHROPLASTY Left 07/07/2016   Procedure: LEFT TOTAL HIP ARTHROPLASTY ANTERIOR APPROACH;  Surgeon: Gaynelle Arabian, MD;  Location: [facility];  Service: Orthopedics;  Laterality: Left;   TOTAL HIP ARTHROPLASTY Right 09/28/2017   Procedure: RIGHT TOTAL HIP ARTHROPLASTY ANTERIOR APPROACH;  Surgeon: Gaynelle Arabian, MD;  Location: [facility];  Service: Orthopedics;  Laterality: Right;   TOTAL KNEE ARTHROPLASTY Right 07/30/2019   Procedure: TOTAL KNEE ARTHROPLASTY;  Surgeon: Gaynelle Arabian, MD;  Location: [facility];  Service: Orthopedics;  Laterality: Right;  69mn    TRANSURETHRAL RESECTION OF BLADDER TUMOR N/A 09/22/2016   Procedure: TRANSURETHRAL RESECTION OF BLADDER TUMOR (TURBT);  Surgeon: TAlexis Frock MD;  [facility]  Service: Urology;  Laterality: N/A;    There were no vitals filed for this visit.   Subjective Assessment - 11/12/21 1448     Subjective Pt reports she had her infusion this morning so she is not sur ehow she'll do with PT today. Pt notes increased tension in [facility] L [facility]    Pertinent History R TKA 07/2019, B THR 2018 & 2017, posterior C1-2 cervical fusion 2018, rupture of flexor tendon of L hand with failed surgical repair 2020, Parkinsons, bladder cancer, HTN    Patient Stated Goals "less pain - able to dress upper body or put on a coat w/o pain"    Currently in Pain? Yes    Pain Score 2    up to 5/10   Pain Location Shoulder    Pain Orientation Left;Upper;Posterior    Pain Type Chronic pain    Multiple Pain Sites No                               [facility] Adult PT Treatment/Exercise - 11/12/21 1445       Knee/Hip Exercises: Aerobic   Nustep L4 x 6 min (UE/LE to promote reciprocal motion)      Shoulder Exercises: Seated   Retraction Both;10 reps;AROM;Strengthening    Retraction Limitations + slight depression into vertical pool noodle at back of chair      Shoulder Exercises: Standing   Internal Rotation Left;10 reps;Strengthening;Theraband    Theraband Level (Shoulder Internal Rotation) Level 1 (Yellow)    Internal Rotation Limitations isometric reactive step-outs   band looped handle around forearm to avoid wrist strain   Extension Both;10 reps;Strengthening;Theraband    Theraband Level (Shoulder Extension) Level 1 (Yellow)    Extension Limitations cues for scap retraction & depression   handles attached to band for ease of grip   Row Both;10 reps;Strengthening;Theraband    Theraband Level (Shoulder Row) Level 1 (Yellow)    Row  Limitations cues to avoid excessive shoulder extension ot upward rotation of elbows   handles attached to band for ease of grip     Shoulder Exercises: Therapy Ball   Flexion Both;10 reps    Flexion Limitations seated green Pball rollout    Scaption Left;10 reps    Scaption Limitations seated green Pball rollout      Modalities   Modalities Electrical Stimulation;Moist Heat      Moist Heat Therapy   Number Minutes Moist  Heat 15 Minutes    Moist Heat Location Shoulder   Lt     Electrical Stimulation   Electrical Stimulation Location L upper shoulder    Electrical Stimulation Action IFC    Electrical Stimulation Parameters 80-150 Hz, intensity to pt tolerance x 15'    Electrical Stimulation Goals Pain;Tone      Manual Therapy   Manual Therapy Soft tissue mobilization;Myofascial release;Passive ROM    Soft tissue mobilization STM/DTM to L UT & LS    Myofascial Release manual TPR to L UT & LS    Passive ROM gentle manual overpressure into pec stretch over pool noodle in chair                       PT Short Term Goals - 11/12/21 1454       PT SHORT TERM GOAL #1   Title Patient will be independent with initial HEP    Status Achieved   11/12/21   Target Date 11/16/21      PT SHORT TERM GOAL #2   Title Patient will demonstrate decreased tightness/spasm in the L upper trap/ levator scapulae to decrease pain to </= 5/10 with movement and promote improved cervical and L shoulder mobility    Status Achieved   11/12/21 - Pain typically only 2-3/10 at rest and no more than 5/10 with activity   Target Date 11/16/21      PT SHORT TERM GOAL #3   Title Patient will be able to verbalize and demonstrate proper posture with sitting/standing to decrease/reduce muscle tightness/reinjury    Status On-going    Target Date 11/16/21      PT SHORT TERM GOAL #4   Title Establish further STGs/LTGs as indicated based on assessment of deficits related to Parkinson's disease    Status  Achieved   10/26/21              PT Long Term Goals - 10/26/21 1529       PT LONG TERM GOAL #1   Title Patient will be independent with ongoing/advanced HEP for self-management at home    Status On-going    Target Date 12/14/21      PT LONG TERM GOAL #2   Title Patient to improve L shoudler AROM to [facility] without pain provocation to allow for increased ease of UB dressing    Status On-going    Target Date 12/14/21      PT LONG TERM GOAL #3   Title Patient to improve cervical AROM by >/= 25% with pain dereased to </= 5/10 with movement to allow for increased participation in church services and improve safety with driving    Status On-going    Target Date 12/14/21      PT LONG TERM GOAL #4   Title Patient will improve FGA score to >/= 25/30 to improve gait stability and reduce risk for falls    Baseline 20/30    Status On-going    Target Date 12/14/21      PT LONG TERM GOAL #5   Title Patient will verbalize understanding of local Parkinson's disease resources.    Status On-going    Target Date 12/14/21                   Plan - 11/12/21 1534     Clinical Impression Statement Correen reports improved ability to sleep on her L side with decreased pain in L shoulder following MT last visit but feels  that driving to [facility] for the holidays has exacerbated her L shoulder again, although max pain only up to 5/10 - STG #2 met. She reports HEP going well (STG #1 met) but when she tried performing self-STM but unable to get the same relief. L UT very TTP today with increased muscle tension/guarding evident which was addressed with further MT and stretching followed by postural strengthening to reduce muscle strain and AAROM to promote improved L shoulder ROM w/o pain. Pt noting crunching in her shoulder with swiss ball rollouts but no pain although some increased discomfort noted with fatigue during isometric step-outs. Session concluded with trial of estim with moist heat to  promote further muscle relaxation and pain reduction.    Comorbidities R TKA 07/2019, B THR 2018 & 2017, cervical fusion 02/23/17 (Posterior cervical approach for a C2 neurectomy on the right side, posterior fusion C1 and C2 with structural allograft and morselized allograft, transarticular screw fixation), h/o L frozen shoulder per pt report, rupture of flexor tendon of L hand with failed surgical repair 12/12/18, Parkinsons disease, bladder cancer, HTN, scoliosis, lumbar spondylolisthesis cervical spondylosis, depression with anxiety    Rehab Potential Good    PT Frequency 2x / week    PT Duration 8 weeks    PT Treatment/Interventions ADLs/Self Care Home Management;Cryotherapy;Electrical Stimulation;Iontophoresis 75m/ml Dexamethasone;Moist Heat;Ultrasound;DME Instruction;Gait training;Stair training;Functional mobility training;Therapeutic activities;Therapeutic exercise;Balance training;Neuromuscular re-education;Patient/family education;Manual techniques;Passive range of motion;Dry needling;Taping;Vasopneumatic Device;Spinal Manipulations;Joint Manipulations    PT Next Visit Plan assess response to estim and provide info on home TENS unit if benefit noted; progress postural stretching and strengthening along with cervical and shoulder AROM; review and update HEP PRN    PT Home Exercise Plan Access Code: [email or web address](12/5, updated 12/12)    Consulted and Agree with Plan of Care Patient             Patient will benefit from skilled therapeutic intervention in order to improve the following deficits and impairments:  Abnormal gait, Decreased activity tolerance, Decreased balance, Decreased knowledge of use of DME, Decreased mobility, Decreased range of motion, Decreased safety awareness, Decreased strength, Difficulty walking, Increased fascial restricitons, Increased muscle spasms, Impaired perceived functional ability, Impaired flexibility, Impaired UE functional use, Improper body mechanics, Postural  dysfunction, Pain  Visit Diagnosis: Chronic left shoulder pain  Stiffness of left shoulder, not elsewhere classified  Cervicalgia  Pain in thoracic spine  Abnormal posture  Muscle weakness (generalized)  Unsteadiness on feet  Other abnormalities of gait and mobility     Problem List Patient Active Problem List   Diagnosis Date Noted   Left shoulder pain 09/14/2021   COVID-19 01/06/2021   Diarrhea 09/03/2020   Vitamin D deficiency 09/03/2020   OA (osteoarthritis) of knee 07/30/2019   Tinea corporis 06/08/2019   [place]oriatic arthritis ([place] 04/02/2019   Anemia 03/13/2018   Hot flashes 03/13/2018   S/P cervical spinal fusion 03/31/2017   Neck pain 12/12/2016   Depression with anxiety 10/17/2016   [place] 10/17/2016   OA (osteoarthritis) of hip 07/07/2016   Spinal stenosis of lumbar region with radiculopathy 04/08/2016   Spondylolisthesis of lumbar region 04/08/2016   Spondylosis of cervical region without myelopathy or radiculopathy 04/08/2016   Preventative health care 01/18/2016   Low back pain 01/18/2016   History of chicken pox    Hyperlipidemia 12/15/2014   Allergic rhinitis 06/25/2014   Allergy to bee sting 06/10/2014   TMJ tenderness 11/16/2013   Other malaise and fatigue 05/07/2013   Arthropathy of cervical spine ([place]  11/27/2012   Muscle spasms of neck 11/27/2012   Parkinson's disease ([place]) 05/24/2012   Conjunctivochalasis 03/09/2012   Epiretinal membrane 03/09/2012   Hyperopia with astigmatism and presbyopia 03/09/2012   Pseudophakia 03/09/2012   HTN (hypertension) 08/31/2011   Right knee pain 08/31/2011   Benign hypertensive heart disease without heart failure 05/12/2011    Percival Spanish, PT 11/12/2021, 7:12 PM  [facility] [address]  [place], [place], [address] Phone: [phone]   Fax:  [phone]  Name: MICHAELA BROSKI MRN: [id number] Date of Birth:  30-Apr-1932

## 2021-11-16 DIAGNOSIS — D485 Neoplasm of uncertain behavior of skin: Secondary | ICD-10-CM | POA: Diagnosis not present

## 2021-11-17 ENCOUNTER — Encounter: Payer: Self-pay | Admitting: Physical Therapy

## 2021-11-17 ENCOUNTER — Ambulatory Visit: Payer: Medicare Other | Attending: Family Medicine | Admitting: Physical Therapy

## 2021-11-17 ENCOUNTER — Other Ambulatory Visit: Payer: Self-pay

## 2021-11-17 DIAGNOSIS — M6281 Muscle weakness (generalized): Secondary | ICD-10-CM | POA: Insufficient documentation

## 2021-11-17 DIAGNOSIS — G8929 Other chronic pain: Secondary | ICD-10-CM | POA: Diagnosis not present

## 2021-11-17 DIAGNOSIS — M25512 Pain in left shoulder: Secondary | ICD-10-CM | POA: Insufficient documentation

## 2021-11-17 DIAGNOSIS — R293 Abnormal posture: Secondary | ICD-10-CM | POA: Insufficient documentation

## 2021-11-17 DIAGNOSIS — R2689 Other abnormalities of gait and mobility: Secondary | ICD-10-CM | POA: Insufficient documentation

## 2021-11-17 DIAGNOSIS — B070 Plantar wart: Secondary | ICD-10-CM | POA: Diagnosis not present

## 2021-11-17 DIAGNOSIS — M542 Cervicalgia: Secondary | ICD-10-CM | POA: Diagnosis not present

## 2021-11-17 DIAGNOSIS — M25612 Stiffness of left shoulder, not elsewhere classified: Secondary | ICD-10-CM | POA: Diagnosis not present

## 2021-11-17 DIAGNOSIS — R2681 Unsteadiness on feet: Secondary | ICD-10-CM | POA: Diagnosis not present

## 2021-11-17 DIAGNOSIS — M546 Pain in thoracic spine: Secondary | ICD-10-CM | POA: Diagnosis not present

## 2021-11-17 NOTE — Patient Instructions (Signed)
° ° °  Access Code: [email or web address] URL: [email or web address] Date: 11/17/2021 Prepared by: Annie Paras  Exercises Shoulder Rolls in Sitting - 2-3 x daily - 7 x weekly - 2 sets - 10 reps Seated Scapular Retraction - 2-3 x daily - 7 x weekly - 2 sets - 10 reps - 5 sec hold Doorway Pec Stretch at 60 Degrees Abduction with Arm Straight - 2-3 x daily - 7 x weekly - 3 reps - 30 sec hold Standing Bilateral Low Shoulder Row with Anchored Resistance - 1 x daily - 7 x weekly - 2 sets - 10 reps - 5 sec hold Scapular Retraction with Resistance Advanced - 1 x daily - 7 x weekly - 2 sets - 10 reps - 5 sec hold Seated Gentle Upper Trapezius Stretch - 2-3 x daily - 7 x weekly - 3 reps - 30 sec hold Gentle Levator Scapulae Stretch - 2-3 x daily - 7 x weekly - 3 reps - 30 sec hold Seated Rhomboid Stretch - 2-3 x daily - 7 x weekly - 3 reps - 30 sec hold Seated Hamstring Stretch - 2-3 x daily - 7 x weekly - 3 reps - 30 sec hold Seated Hip Flexor Stretch - 2-3 x daily - 7 x weekly - 3 reps - 30 sec hold Seated Isometric Hip Abduction with Resistance - 1 x daily - 7 x weekly - 2 sets - 10 reps - 3 sec hold Seated March with Resistance - 1 x daily - 7 x weekly - 2 sets - 10 reps - 3 sec hold Sit to Stand with Resistance Around Legs - 1 x daily - 7 x weekly - 2 sets - 5 reps

## 2021-11-17 NOTE — Therapy (Signed)
[facility] [address]  [place], [place], [address] Phone: [phone]   Fax:  [phone]  Physical Therapy Treatment  Patient Details  Name: Stephanie [name] MRN: [id number] Date of Birth: 1932/07/12 Referring Provider (PT): Penni Homans, MD   Encounter Date: 11/17/2021   PT End of Session - 11/17/21 1446     Visit Number 7    Number of Visits 16    Date for PT Re-Evaluation 12/14/21    Authorization Type Medicare & BCBS    PT Start Time 1446    PT Stop Time 1528    PT Time Calculation (min) 42 min    Activity Tolerance Patient tolerated treatment well    Behavior During Therapy [facility] for tasks assessed/performed             Past Medical History:  Diagnosis Date   Allergy    Anemia    past hx    Arthropathy of cervical spine 11/27/2012   Right  Xray at [facility]  On 04/08/2016  AP, lateral, and lateral flexion and extension views of the cervical spine are submitted for evaluation.   No acute fracture identified in the cervical spine.   There is redemonstration of approximately 2 mm of C3 on C4 anterolisthesis in neutral which slightly increases in flexion relative to neutral and extension. Slight C5 on C6 retrolisthesis does not change i   Benign hypertension without congestive heart failure    Benign mole    right hcets mole, bleeds when dried with towel   Bilateral dry eyes    Bladder cancer ([place]) 08-Nov-2016   Cervical spondylosis    Cervicalgia    2 screws in place    Chronic kidney disease    kidney stones    Depression with anxiety 2016/11/08   prior to husbands death    Gross hematuria    developed after first hip replacment, led to indicental finding of bladder tumor, bleeding now resolved    History of kidney stones    1970's   Left hand weakness    due to reinjury of flxor tendon s/p tendon surgery march 12-2018   Lumbar stenosis    Malignant tumor of urinary bladder ([place]) 07/2016   Pre-stage I    OA (osteoarthritis)    Parkinson disease [facility]) dx 2012   neurologist-  dr siddiqui at [place] [facility])     Dr Trudie Reed , [place] [facility]    Rupture of flexor tendon of hand 2020   right    Past Surgical History:  Procedure Laterality Date   BIOPSY MASS LEFT FIRST METACARPAL   06/23/2006   benign   CATARACT EXTRACTION W/ INTRAOCULAR LENS  IMPLANT, BILATERAL  1999 and 2003   CERVICAL FUSION  02/2017   c1-c2 ; reports she has 2 screws 2in long in place done at [facility] ; patient exhibits VERY LIMITED NECK ROM   COLONOSCOPY     CYSTOSCOPY W/ RETROGRADES Bilateral 09/22/2016   Procedure: CYSTOSCOPY WITH RETROGRADE PYELOGRAM;  Surgeon: Alexis Frock, MD;  Location: [facility];  Service: Urology;  Laterality: Bilateral;   D & C HYSTEROSCOPY W/ RESECTION POLYP  07/11/2000   DILATION AND CURETTAGE OF UTERUS  1960's   EXCISION CYST AND DEBRIDEMENT RIGHT WIRST AND REMOVAL FORGEIGN BODY  01/09/2009   FLEXOR TENDON REPAIR Left 12/12/2018   Procedure: REPAIR/TRANSFER FLEXOR DIGITORUM PROFUNDUS OF LEFT SMALL FINGER;  Surgeon: Daryll Brod, MD;  Location: [facility];  Service: Orthopedics;  Laterality: Left;   LAPAROSCOPY  yrs ago   infertility    METACARPOPHALANGEAL JOINT ARTHRODESIS Right 05/25/1996   TENDON REPAIR Left 01/15/2019   Hand   TONSILLECTOMY AND ADENOIDECTOMY  child   TOTAL HIP ARTHROPLASTY Left 07/07/2016   Procedure: LEFT TOTAL HIP ARTHROPLASTY ANTERIOR APPROACH;  Surgeon: Gaynelle Arabian, MD;  Location: [facility];  Service: Orthopedics;  Laterality: Left;   TOTAL HIP ARTHROPLASTY Right 09/28/2017   Procedure: RIGHT TOTAL HIP ARTHROPLASTY ANTERIOR APPROACH;  Surgeon: Gaynelle Arabian, MD;  Location: [facility];  Service: Orthopedics;  Laterality: Right;   TOTAL KNEE ARTHROPLASTY Right 07/30/2019   Procedure: TOTAL KNEE ARTHROPLASTY;  Surgeon: Gaynelle Arabian, MD;  Location: [facility];  Service: Orthopedics;  Laterality: Right;  42min    TRANSURETHRAL RESECTION OF BLADDER TUMOR N/A 09/22/2016   Procedure: TRANSURETHRAL RESECTION OF BLADDER TUMOR (TURBT);  Surgeon: Alexis Frock, MD;  Location: [facility];  Service: Urology;  Laterality: N/A;    There were no vitals filed for this visit.   Subjective Assessment - 11/17/21 1450     Subjective Pt reports she went for walk first thing morning - she states if she does not go first thing, she won't be able to do it as her energy wanes as the day progresses. She stated estim felt good last visit but did not seem to make a significant change in her pain.    Pertinent History R TKA 07/2019, B THR 2018 & 2017, posterior C1-2 cervical fusion 2018, rupture of flexor tendon of L hand with failed surgical repair 2020, Parkinsons, bladder cancer, HTN    Patient Stated Goals "less pain - able to dress upper body or put on a coat w/o pain"    Currently in Pain? Yes    Pain Score 2     Pain Location Shoulder    Pain Orientation Left    Pain Descriptors / Indicators Tightness   "stiff"   Pain Type Chronic pain    Pain Score 3    Pain Location Knee    Pain Orientation Right;Lateral    Pain Descriptors / Indicators Other (Comment)   "not there all the time"   Pain Frequency Intermittent                [facility] PT Assessment - 11/17/21 1446       Strength   Strength Assessment Site Hip;Knee;Ankle    Right/Left Hip Right;Left    Right Hip Flexion 4-/5    Right Hip Extension 4-/5    Right Hip External Rotation  4/5    Right Hip Internal Rotation 4+/5    Right Hip ABduction 4/5    Right Hip ADduction 4+/5    Left Hip Flexion 4-/5    Left Hip Extension 4-/5    Left Hip External Rotation 4/5    Left Hip Internal Rotation 4+/5    Left Hip ABduction 4/5    Left Hip ADduction 4+/5    Right/Left Knee Right;Left    Right Knee Flexion 4/5    Right Knee Extension 4+/5    Left Knee Flexion 4+/5    Left Knee Extension 4+/5    Right/Left Ankle Right;Left    Right Ankle  Dorsiflexion 4/5    Left Ankle Dorsiflexion 4+/5                           [facility] Adult PT Treatment/Exercise -  11/17/21 1446       Knee/Hip Exercises: Stretches   Passive Hamstring Stretch Right;2 reps;30 seconds    Passive Hamstring Stretch Limitations seated hip hinge    Hip Flexor Stretch Right;2 reps;30 seconds    Hip Flexor Stretch Limitations seated lunge position over edge of chair      Knee/Hip Exercises: Aerobic   Nustep L5 x 6 min (UE/LE to promote reciprocal motion)      Knee/Hip Exercises: Seated   Clamshell with TheraBand Red   alt hip ABD/ER 10 x 3" - cues to avoid allowing band to pull knee back to midline   Marching Both;10 reps;Strengthening    Marching Limitations looped red TB at knees    Sit to Sand 10 reps;without UE support   + red TB hip ABD isometric; mirror feedback to avoid R genu valgum     Manual Therapy   Manual Therapy Soft tissue mobilization;Myofascial release;Other (comment)    Soft tissue mobilization STM/DTM to R lateral quads    Myofascial Release pin & stretch to R lateral quads and ITB    Other Manual Therapy Provided education in self-STM to quads using rolling pin                     PT Education - 11/17/21 1528     Education Details HEP update - LE stretching and strengthening - Access Code: [email or web address]    Person(s) Educated Patient    Methods Explanation;Demonstration;Verbal cues;Tactile cues;Handout   mirror feedback   Comprehension Verbalized understanding;Verbal cues required;Tactile cues required;Returned demonstration;Need further instruction              PT Short Term Goals - 11/17/21 1508       PT SHORT TERM GOAL #1   Title Patient will be independent with initial HEP    Status Achieved   11/12/21   Target Date 11/16/21      PT SHORT TERM GOAL #2   Title Patient will demonstrate decreased tightness/spasm in the L upper trap/ levator scapulae to decrease pain to </= 5/10 with movement and  promote improved cervical and L shoulder mobility    Status Achieved   11/12/21 - Pain typically only 2-3/10 at rest and no more than 5/10 with activity   Target Date 11/16/21      PT SHORT TERM GOAL #3   Title Patient will be able to verbalize and demonstrate proper posture with sitting/standing to decrease/reduce muscle tightness/reinjury    Status Achieved   11/17/21   Target Date 11/16/21      PT SHORT TERM GOAL #4   Title Establish further STGs/LTGs as indicated based on assessment of deficits related to Parkinson's disease    Status Achieved   10/26/21              PT Long Term Goals - 11/17/21 1509       PT LONG TERM GOAL #1   Title Patient will be independent with ongoing/advanced HEP for self-management at home    Status On-going    Target Date 12/14/21      PT LONG TERM GOAL #2   Title Patient to improve L shoudler AROM to [facility] without pain provocation to allow for increased ease of UB dressing    Status On-going    Target Date 12/14/21      PT LONG TERM GOAL #3   Title Patient to improve cervical AROM by >/= 25% with pain dereased to </= 5/10 with movement  to allow for increased participation in church services and improve safety with driving    Status On-going    Target Date 12/14/21      PT LONG TERM GOAL #4   Title Patient will improve FGA score to >/= 25/30 to improve gait stability and reduce risk for falls    Baseline 20/30    Status On-going    Target Date 12/14/21      PT LONG TERM GOAL #5   Title Patient will verbalize understanding of local Parkinson's disease resources.    Status On-going    Target Date 12/14/21                   Plan - 11/17/21 1509     Clinical Impression Statement Stephanie [name] reports increased lateral R knee pain today with functional mobility assessment noting tendency for genu valgus during sit <> stand transitions. MMT revealing mild LE weakness R>L and proximal > distal which is likely contributing to uneven stress  through R medial/lateral knee. Session focusing on stretching and strengthening to improve functional alignment esp during transitional movement as well as improve stability and safety with mobility and gait - mirror feedback necessary to improve awareness of alignment with sit <> stand transitions, but pt able to perform all exercises. HEP updated to include LE stretches and exercises.    Comorbidities R TKA 07/2019, B THR 2018 & 2017, cervical fusion 02/23/17 (Posterior cervical approach for a C2 neurectomy on the right side, posterior fusion C1 and C2 with structural allograft and morselized allograft, transarticular screw fixation), h/o L frozen shoulder per pt report, rupture of flexor tendon of L hand with failed surgical repair 12/12/18, Parkinsons disease, bladder cancer, HTN, scoliosis, lumbar spondylolisthesis cervical spondylosis, depression with anxiety    Rehab Potential Good    PT Frequency 2x / week    PT Duration 8 weeks    PT Treatment/Interventions ADLs/Self Care Home Management;Cryotherapy;Electrical Stimulation;Iontophoresis 4mg /ml Dexamethasone;Moist Heat;Ultrasound;DME Instruction;Gait training;Stair training;Functional mobility training;Therapeutic activities;Therapeutic exercise;Balance training;Neuromuscular re-education;Patient/family education;Manual techniques;Passive range of motion;Dry needling;Taping;Vasopneumatic Device;Spinal Manipulations;Joint Manipulations    PT Next Visit Plan progress postural stretching and strengthening along with cervical and shoulder AROM; review and update HEP PRN; provide info on home TENS unit per pt request    PT Home Exercise Plan Access Code: [email or web address] (12/5, updated 12/12, 12/15 & 1/3)    Consulted and Agree with Plan of Care Patient             Patient will benefit from skilled therapeutic intervention in order to improve the following deficits and impairments:  Abnormal gait, Decreased activity tolerance, Decreased balance, Decreased  knowledge of use of DME, Decreased mobility, Decreased range of motion, Decreased safety awareness, Decreased strength, Difficulty walking, Increased fascial restricitons, Increased muscle spasms, Impaired perceived functional ability, Impaired flexibility, Impaired UE functional use, Improper body mechanics, Postural dysfunction, Pain  Visit Diagnosis: Chronic left shoulder pain  Stiffness of left shoulder, not elsewhere classified  Cervicalgia  Pain in thoracic spine  Abnormal posture  Muscle weakness (generalized)  Unsteadiness on feet  Other abnormalities of gait and mobility     Problem List Patient Active Problem List   Diagnosis Date Noted   Left shoulder pain 09/14/2021   COVID-19 01/06/2021   Diarrhea 09/03/2020   Vitamin D deficiency 09/03/2020   OA (osteoarthritis) of knee 07/30/2019   Tinea corporis 06/08/2019   Psoriatic arthritis ([place]) 04/02/2019   Anemia 03/13/2018   Hot flashes 03/13/2018   S/P cervical spinal fusion 03/31/2017  Neck pain 12/12/2016   Depression with anxiety 10/17/2016   Bladder cancer ([place]) 10/17/2016   OA (osteoarthritis) of hip 07/07/2016   Spinal stenosis of lumbar region with radiculopathy 04/08/2016   Spondylolisthesis of lumbar region 04/08/2016   Spondylosis of cervical region without myelopathy or radiculopathy 04/08/2016   Preventative health care 01/18/2016   Low back pain 01/18/2016   History of chicken pox    Hyperlipidemia 12/15/2014   Allergic rhinitis 06/25/2014   Allergy to bee sting 06/10/2014   TMJ tenderness 11/16/2013   Other malaise and fatigue 05/07/2013   Arthropathy of cervical spine ([place]) 11/27/2012   Muscle spasms of neck 11/27/2012   Parkinson's disease ([place]) 05/24/2012   Conjunctivochalasis 03/09/2012   Epiretinal membrane 03/09/2012   Hyperopia with astigmatism and presbyopia 03/09/2012   Pseudophakia 03/09/2012   HTN (hypertension) 08/31/2011   Right knee pain 08/31/2011   Benign hypertensive  heart disease without heart failure 05/12/2011    Percival Spanish, PT 11/17/2021, [facility] [address]  [place], [place], [address] Phone: [phone]   Fax:  [phone]  Name: Stephanie [name] MRN: [id number] Date of Birth: Jan 06, 1932

## 2021-11-19 ENCOUNTER — Encounter: Payer: Self-pay | Admitting: Physical Therapy

## 2021-11-19 ENCOUNTER — Ambulatory Visit: Payer: Medicare Other | Admitting: Physical Therapy

## 2021-11-19 ENCOUNTER — Other Ambulatory Visit: Payer: Self-pay

## 2021-11-19 DIAGNOSIS — M25612 Stiffness of left shoulder, not elsewhere classified: Secondary | ICD-10-CM

## 2021-11-19 DIAGNOSIS — G8929 Other chronic pain: Secondary | ICD-10-CM

## 2021-11-19 DIAGNOSIS — M25512 Pain in left shoulder: Secondary | ICD-10-CM | POA: Diagnosis not present

## 2021-11-19 DIAGNOSIS — R293 Abnormal posture: Secondary | ICD-10-CM | POA: Diagnosis not present

## 2021-11-19 DIAGNOSIS — M546 Pain in thoracic spine: Secondary | ICD-10-CM | POA: Diagnosis not present

## 2021-11-19 DIAGNOSIS — R2681 Unsteadiness on feet: Secondary | ICD-10-CM

## 2021-11-19 DIAGNOSIS — R2689 Other abnormalities of gait and mobility: Secondary | ICD-10-CM

## 2021-11-19 DIAGNOSIS — M542 Cervicalgia: Secondary | ICD-10-CM

## 2021-11-19 DIAGNOSIS — M6281 Muscle weakness (generalized): Secondary | ICD-10-CM

## 2021-11-19 NOTE — Patient Instructions (Signed)

## 2021-11-19 NOTE — Therapy (Signed)
[facility] [address]  [place], [place], [address] Phone: [phone]   Fax:  [phone]  Physical Therapy Treatment  Patient Details  Name: Stephanie [name] MRN: [id number] Date of Birth: [birth year] Referring Provider (PT): Penni Homans, MD   Encounter Date: 11/19/2021   PT End of Session - 11/19/21 1448     Visit Number 8    Number of Visits 16    Date for PT Re-Evaluation 12/14/21    Authorization Type Medicare & BCBS    PT Start Time 1448    PT Stop Time 1535    PT Time Calculation (min) 47 min    Activity Tolerance Patient tolerated treatment well    Behavior During Therapy [facility] for tasks assessed/performed             Past Medical History:  Diagnosis Date   Allergy    Anemia    past hx    Arthropathy of cervical spine 11/27/2012   Right  Xray at [facility]  On 04/08/2016  AP, lateral, and lateral flexion and extension views of the cervical spine are submitted for evaluation.   No acute fracture identified in the cervical spine.   There is redemonstration of approximately 2 mm of C3 on C4 anterolisthesis in neutral which slightly increases in flexion relative to neutral and extension. Slight C5 on C6 retrolisthesis does not change i   Benign hypertension without congestive heart failure    Benign mole    right hcets mole, bleeds when dried with towel   Bilateral dry eyes    Bladder cancer ([place]) 11/15/16   Cervical spondylosis    Cervicalgia    2 screws in place    Chronic kidney disease    kidney stones    Depression with anxiety 2016/11/15   prior to husbands death    Gross hematuria    developed after first hip replacment, led to indicental finding of bladder tumor, bleeding now resolved    History of kidney stones    1970's   Left hand weakness    due to reinjury of flxor tendon s/p tendon surgery march 12-2018   Lumbar stenosis    Malignant tumor of urinary bladder ([place]) 07/2016   Pre-stage I    OA (osteoarthritis)    Parkinson disease [facility]) dx 2012   neurologist-  dr siddiqui at [place] [facility])     Dr Trudie Reed , [place] [facility]    Rupture of flexor tendon of hand 2020   right    Past Surgical History:  Procedure Laterality Date   BIOPSY MASS LEFT FIRST METACARPAL   06/23/2006   benign   CATARACT EXTRACTION W/ INTRAOCULAR LENS  IMPLANT, BILATERAL  1999 and 2003   CERVICAL FUSION  02/2017   c1-c2 ; reports she has 2 screws 2in long in place done at [facility] ; patient exhibits VERY LIMITED NECK ROM   COLONOSCOPY     CYSTOSCOPY W/ RETROGRADES Bilateral 09/22/2016   Procedure: CYSTOSCOPY WITH RETROGRADE PYELOGRAM;  Surgeon: Alexis Frock, MD;  Location: [facility];  Service: Urology;  Laterality: Bilateral;   D & C HYSTEROSCOPY W/ RESECTION POLYP  07/11/2000   DILATION AND CURETTAGE OF UTERUS  1960's   EXCISION CYST AND DEBRIDEMENT RIGHT WIRST AND REMOVAL FORGEIGN BODY  01/09/2009   FLEXOR TENDON REPAIR Left 12/12/2018   Procedure: REPAIR/TRANSFER FLEXOR DIGITORUM PROFUNDUS OF LEFT SMALL FINGER;  Surgeon: Daryll Brod, MD;  Location: [facility];  Service: Orthopedics;  Laterality: Left;   LAPAROSCOPY  yrs ago   infertility    METACARPOPHALANGEAL JOINT ARTHRODESIS Right 05/25/1996   TENDON REPAIR Left 01/15/2019   Hand   TONSILLECTOMY AND ADENOIDECTOMY  child   TOTAL HIP ARTHROPLASTY Left 07/07/2016   Procedure: LEFT TOTAL HIP ARTHROPLASTY ANTERIOR APPROACH;  Surgeon: Gaynelle Arabian, MD;  Location: [facility];  Service: Orthopedics;  Laterality: Left;   TOTAL HIP ARTHROPLASTY Right 09/28/2017   Procedure: RIGHT TOTAL HIP ARTHROPLASTY ANTERIOR APPROACH;  Surgeon: Gaynelle Arabian, MD;  Location: [facility];  Service: Orthopedics;  Laterality: Right;   TOTAL KNEE ARTHROPLASTY Right 07/30/2019   Procedure: TOTAL KNEE ARTHROPLASTY;  Surgeon: Gaynelle Arabian, MD;  Location: [facility];  Service: Orthopedics;  Laterality: Right;  2min    TRANSURETHRAL RESECTION OF BLADDER TUMOR N/A 09/22/2016   Procedure: TRANSURETHRAL RESECTION OF BLADDER TUMOR (TURBT);  Surgeon: Alexis Frock, MD;  Location: [facility];  Service: Urology;  Laterality: N/A;    There were no vitals filed for this visit.   Subjective Assessment - 11/19/21 1450     Subjective Pt reports the exercises from last visit seem to be helping her knee.    Pertinent History R TKA 07/2019, B THR 2018 & 2017, posterior C1-2 cervical fusion 2018, rupture of flexor tendon of L hand with failed surgical repair 2020, Parkinsons, bladder cancer, HTN    Patient Stated Goals "less pain - able to dress upper body or put on a coat w/o pain"    Currently in Pain? Yes    Pain Score 0-No pain    Pain Location Shoulder    Pain Orientation Left    Pain Type Chronic pain    Pain Frequency Intermittent    Pain Score 2   1-2/10   Pain Location Knee    Pain Orientation Right;Lateral    Pain Descriptors / Indicators Sore                               [facility] Adult PT Treatment/Exercise - 11/19/21 1448       Self-Care   Self-Care Posture    Posture Provided education proper posture and body mechanics for typical daily mobility and daily tasks including modifcations for sleeping posture to reduce LBP      Knee/Hip Exercises: Aerobic   Nustep L5 x 6 min (UE/LE to promote reciprocal motion)               [facility]) - 11/19/21 1448     PWR! exercises Moves in sitting;Functional moves    PWR! Sit to Stand x5    PWR! Up x10    PWR! Rock x10    PWR! Twist x10   boom whacker utilized to extend reach   [employer]! Step x10                    PT Short Term Goals - 11/17/21 1508       PT SHORT TERM GOAL #1   Title Patient will be independent with initial HEP    Status Achieved   11/12/21   Target Date 11/16/21      PT SHORT TERM GOAL #2   Title Patient will demonstrate decreased tightness/spasm in the L upper trap/ levator  scapulae to decrease pain to </= 5/10 with movement and promote improved cervical and L shoulder mobility    Status Achieved  11/12/21 - Pain typically only 2-3/10 at rest and no more than 5/10 with activity   Target Date 11/16/21      PT SHORT TERM GOAL #3   Title Patient will be able to verbalize and demonstrate proper posture with sitting/standing to decrease/reduce muscle tightness/reinjury    Status Achieved   11/17/21   Target Date 11/16/21      PT SHORT TERM GOAL #4   Title Establish further STGs/LTGs as indicated based on assessment of deficits related to Parkinson's disease    Status Achieved   10/26/21              PT Long Term Goals - 11/17/21 1509       PT LONG TERM GOAL #1   Title Patient will be independent with ongoing/advanced HEP for self-management at home    Status On-going    Target Date 12/14/21      PT LONG TERM GOAL #2   Title Patient to improve L shoudler AROM to [facility] without pain provocation to allow for increased ease of UB dressing    Status On-going    Target Date 12/14/21      PT LONG TERM GOAL #3   Title Patient to improve cervical AROM by >/= 25% with pain dereased to </= 5/10 with movement to allow for increased participation in church services and improve safety with driving    Status On-going    Target Date 12/14/21      PT LONG TERM GOAL #4   Title Patient will improve FGA score to >/= 25/30 to improve gait stability and reduce risk for falls    Baseline 20/30    Status On-going    Target Date 12/14/21      PT LONG TERM GOAL #5   Title Patient will verbalize understanding of local Parkinson's disease resources.    Status On-going    Target Date 12/14/21                   Plan - 11/19/21 1535     Clinical Impression Statement Stephanie [name] reports improvement in her R knee pain with stretches and exercises introduced last visit and denies need for review of these or prior HEP exercises today. She does note some increased LBP in  the mornings from having to sleep on her back due to her shoulder pain but denies any pain in either low back or shoulder currently. Provided education on ideal sleeping position/posture to reduce back pain while sleeping as well as proper posture and body mechanics for other typical daily tasks and positioning to reduce neck, shoulder and low back strain. Given improving knee pain and no shoulder pain today, shifted treatment focus to initiation of PWR! Moves to address PD related deficits with introduction of PWR! moves in sitting along with explanation of carryover of movement patterns in to typical daily mobility - pt able to perform good return demonstration of all seated moves, therefore HEP instructions provided along with guidance on how to rotate through current HEPs for UB, LB and PD so as to not over exert herself and allow for recovery from exercises.    Comorbidities R TKA 07/2019, B THR 2018 & 2017, cervical fusion 02/23/17 (Posterior cervical approach for a C2 neurectomy on the right side, posterior fusion C1 and C2 with structural allograft and morselized allograft, transarticular screw fixation), h/o L frozen shoulder per pt report, rupture of flexor tendon of L hand with failed surgical repair 12/12/18, Parkinsons disease, bladder  cancer, HTN, scoliosis, lumbar spondylolisthesis cervical spondylosis, depression with anxiety    Rehab Potential Good    PT Frequency 2x / week    PT Duration 8 weeks    PT Treatment/Interventions ADLs/Self Care Home Management;Cryotherapy;Electrical Stimulation;Iontophoresis 4mg /ml Dexamethasone;Moist Heat;Ultrasound;DME Instruction;Gait training;Stair training;Functional mobility training;Therapeutic activities;Therapeutic exercise;Balance training;Neuromuscular re-education;Patient/family education;Manual techniques;Passive range of motion;Dry needling;Taping;Vasopneumatic Device;Spinal Manipulations;Joint Manipulations    PT Next Visit Plan progress postural  stretching and strengthening along with cervical and shoulder AROM; review and update HEP PRN; provide info on home TENS unit per pt request    PT Home Exercise Plan Access Code: [email or web address] (12/5, updated 12/12, 12/15 & 1/3); PWR! Moves: Seated (1/5)    Consulted and Agree with Plan of Care Patient             Patient will benefit from skilled therapeutic intervention in order to improve the following deficits and impairments:  Abnormal gait, Decreased activity tolerance, Decreased balance, Decreased knowledge of use of DME, Decreased mobility, Decreased range of motion, Decreased safety awareness, Decreased strength, Difficulty walking, Increased fascial restricitons, Increased muscle spasms, Impaired perceived functional ability, Impaired flexibility, Impaired UE functional use, Improper body mechanics, Postural dysfunction, Pain  Visit Diagnosis: Chronic left shoulder pain  Stiffness of left shoulder, not elsewhere classified  Cervicalgia  Pain in thoracic spine  Abnormal posture  Muscle weakness (generalized)  Unsteadiness on feet  Other abnormalities of gait and mobility     Problem List Patient Active Problem List   Diagnosis Date Noted   Left shoulder pain 09/14/2021   COVID-19 01/06/2021   Diarrhea 09/03/2020   Vitamin D deficiency 09/03/2020   OA (osteoarthritis) of knee 07/30/2019   Tinea corporis 06/08/2019   Psoriatic arthritis ([place]) 04/02/2019   Anemia 03/13/2018   Hot flashes 03/13/2018   S/P cervical spinal fusion 03/31/2017   Neck pain 12/12/2016   Depression with anxiety 10/17/2016   Bladder cancer ([place]) 10/17/2016   OA (osteoarthritis) of hip 07/07/2016   Spinal stenosis of lumbar region with radiculopathy 04/08/2016   Spondylolisthesis of lumbar region 04/08/2016   Spondylosis of cervical region without myelopathy or radiculopathy 04/08/2016   Preventative health care 01/18/2016   Low back pain 01/18/2016   History of chicken pox     Hyperlipidemia 12/15/2014   Allergic rhinitis 06/25/2014   Allergy to bee sting 06/10/2014   TMJ tenderness 11/16/2013   Other malaise and fatigue 05/07/2013   Arthropathy of cervical spine ([place]) 11/27/2012   Muscle spasms of neck 11/27/2012   Parkinson's disease ([place]) 05/24/2012   Conjunctivochalasis 03/09/2012   Epiretinal membrane 03/09/2012   Hyperopia with astigmatism and presbyopia 03/09/2012   Pseudophakia 03/09/2012   HTN (hypertension) 08/31/2011   Right knee pain 08/31/2011   Benign hypertensive heart disease without heart failure 05/12/2011    Percival Spanish, PT 11/19/2021, 7:35 PM  [facility] [address]  [place], [place], [address] Phone: [phone]   Fax:  [phone]  Name: Stephanie [name] MRN: [id number] Date of Birth: 1932/08/27

## 2021-11-23 ENCOUNTER — Other Ambulatory Visit: Payer: Self-pay

## 2021-11-23 ENCOUNTER — Encounter: Payer: Self-pay | Admitting: Physical Therapy

## 2021-11-23 ENCOUNTER — Ambulatory Visit: Payer: Medicare Other | Admitting: Physical Therapy

## 2021-11-23 DIAGNOSIS — M6281 Muscle weakness (generalized): Secondary | ICD-10-CM

## 2021-11-23 DIAGNOSIS — M25612 Stiffness of left shoulder, not elsewhere classified: Secondary | ICD-10-CM

## 2021-11-23 DIAGNOSIS — M542 Cervicalgia: Secondary | ICD-10-CM

## 2021-11-23 DIAGNOSIS — R293 Abnormal posture: Secondary | ICD-10-CM | POA: Diagnosis not present

## 2021-11-23 DIAGNOSIS — G8929 Other chronic pain: Secondary | ICD-10-CM

## 2021-11-23 DIAGNOSIS — M546 Pain in thoracic spine: Secondary | ICD-10-CM

## 2021-11-23 DIAGNOSIS — R2689 Other abnormalities of gait and mobility: Secondary | ICD-10-CM

## 2021-11-23 DIAGNOSIS — M25512 Pain in left shoulder: Secondary | ICD-10-CM | POA: Diagnosis not present

## 2021-11-23 DIAGNOSIS — R2681 Unsteadiness on feet: Secondary | ICD-10-CM

## 2021-11-23 NOTE — Therapy (Signed)
[facility] [address]  [place], [place], [address] Phone: [phone]   Fax:  [phone]  Physical Therapy Treatment  Patient Details  Name: Stephanie [name] MRN: [id number] Date of Birth: [birth year] Referring Provider (PT): Penni Homans, MD   Encounter Date: 11/23/2021   PT End of Session - 11/23/21 1613     Visit Number 9    Number of Visits 16    Date for PT Re-Evaluation 12/14/21    Authorization Type Medicare & BCBS    PT Start Time 1613    PT Stop Time 1657    PT Time Calculation (min) 44 min    Activity Tolerance Patient tolerated treatment well    Behavior During Therapy [facility] for tasks assessed/performed             Past Medical History:  Diagnosis Date   Allergy    Anemia    past hx    Arthropathy of cervical spine 11/27/2012   Right  Xray at [facility]  On 04/08/2016  AP, lateral, and lateral flexion and extension views of the cervical spine are submitted for evaluation.   No acute fracture identified in the cervical spine.   There is redemonstration of approximately 2 mm of C3 on C4 anterolisthesis in neutral which slightly increases in flexion relative to neutral and extension. Slight C5 on C6 retrolisthesis does not change i   Benign hypertension without congestive heart failure    Benign mole    right hcets mole, bleeds when dried with towel   Bilateral dry eyes    Bladder cancer ([place]) 11-14-2016   Cervical spondylosis    Cervicalgia    2 screws in place    Chronic kidney disease    kidney stones    Depression with anxiety 14-Nov-2016   prior to husbands death    Gross hematuria    developed after first hip replacment, led to indicental finding of bladder tumor, bleeding now resolved    History of kidney stones    1970's   Left hand weakness    due to reinjury of flxor tendon s/p tendon surgery march 12-2018   Lumbar stenosis    Malignant tumor of urinary bladder ([place]) 07/2016   Pre-stage I    OA (osteoarthritis)    Parkinson disease [facility]) dx 2012   neurologist-  dr siddiqui at [place] [facility])     Dr Trudie Reed , [place] [facility]    Rupture of flexor tendon of hand 2020   right    Past Surgical History:  Procedure Laterality Date   BIOPSY MASS LEFT FIRST METACARPAL   06/23/2006   benign   CATARACT EXTRACTION W/ INTRAOCULAR LENS  IMPLANT, BILATERAL  1999 and 2003   CERVICAL FUSION  02/2017   c1-c2 ; reports she has 2 screws 2in long in place done at [facility] ; patient exhibits VERY LIMITED NECK ROM   COLONOSCOPY     CYSTOSCOPY W/ RETROGRADES Bilateral 09/22/2016   Procedure: CYSTOSCOPY WITH RETROGRADE PYELOGRAM;  Surgeon: Alexis Frock, MD;  Location: [facility];  Service: Urology;  Laterality: Bilateral;   D & C HYSTEROSCOPY W/ RESECTION POLYP  07/11/2000   DILATION AND CURETTAGE OF UTERUS  1960's   EXCISION CYST AND DEBRIDEMENT RIGHT WIRST AND REMOVAL FORGEIGN BODY  01/09/2009   FLEXOR TENDON REPAIR Left 12/12/2018   Procedure: REPAIR/TRANSFER FLEXOR DIGITORUM PROFUNDUS OF LEFT SMALL FINGER;  Surgeon: Daryll Brod, MD;  Location: [facility];  Service: Orthopedics;  Laterality: Left;   LAPAROSCOPY  yrs ago   infertility    METACARPOPHALANGEAL JOINT ARTHRODESIS Right 05/25/1996   TENDON REPAIR Left 01/15/2019   Hand   TONSILLECTOMY AND ADENOIDECTOMY  child   TOTAL HIP ARTHROPLASTY Left 07/07/2016   Procedure: LEFT TOTAL HIP ARTHROPLASTY ANTERIOR APPROACH;  Surgeon: Gaynelle Arabian, MD;  Location: [facility];  Service: Orthopedics;  Laterality: Left;   TOTAL HIP ARTHROPLASTY Right 09/28/2017   Procedure: RIGHT TOTAL HIP ARTHROPLASTY ANTERIOR APPROACH;  Surgeon: Gaynelle Arabian, MD;  Location: [facility];  Service: Orthopedics;  Laterality: Right;   TOTAL KNEE ARTHROPLASTY Right 07/30/2019   Procedure: TOTAL KNEE ARTHROPLASTY;  Surgeon: Gaynelle Arabian, MD;  Location: [facility];  Service: Orthopedics;  Laterality: Right;  65min    TRANSURETHRAL RESECTION OF BLADDER TUMOR N/A 09/22/2016   Procedure: TRANSURETHRAL RESECTION OF BLADDER TUMOR (TURBT);  Surgeon: Alexis Frock, MD;  Location: [facility];  Service: Urology;  Laterality: N/A;    There were no vitals filed for this visit.   Subjective Assessment - 11/23/21 1617     Subjective Pt reports she has been limping due to soreness in her R great toe after surgical removal of a plantar wart last week.    Pertinent History R TKA 07/2019, B THR 2018 & 2017, posterior C1-2 cervical fusion 2018, rupture of flexor tendon of L hand with failed surgical repair 2020, Parkinsons, bladder cancer, HTN    Patient Stated Goals "less pain - able to dress upper body or put on a coat w/o pain"    Currently in Pain? Yes    Pain Score 3     Pain Location Toe (Comment which one)   great toe   Pain Orientation Right    Pain Descriptors / Indicators Sore    Pain Type Acute pain                               [facility] Adult PT Treatment/Exercise - 11/23/21 1613       Knee/Hip Exercises: Aerobic   Nustep L5 x 6 min (UE/LE to promote reciprocal motion)      Knee/Hip Exercises: Seated   Long Arc Quad Right;Left;2 sets;10 reps;Strengthening    Long Arc Quad Limitations looped red TB at ankles    Ball Squeeze 2 x10 - 5 sec hold    Clamshell with TheraBand Red   alt hip ABD/ER 10 x 3", 2 sets - cues to avoid allowing band to pull knee back to midline   Other Seated Knee/Hip Exercises Red TB hip IR + hip ADD ball squeeze 2 x 10    Marching Both;2 sets;10 reps;Strengthening    Marching Limitations looped red TB at knees    Hamstring Curl Right;Left;2 sets;10 reps;Strengthening    Hamstring Limitations looped red TB at ankles      Shoulder Exercises: Seated   Extension Both;10 reps;Strengthening;Theraband    Theraband Level (Shoulder Extension) Level 2 (Red)    Extension Limitations cues for scap retraction   handles attached to band for ease of grip    Row Both;10 reps;Strengthening;Theraband   2 sets   Theraband Level (Shoulder Row) Level 2 (Red)    Row Limitations cues for scap retraction & increased hold time   handles attached to band for ease of grip   External Rotation Both;10 reps;Strengthening;Theraband    Theraband Level (Shoulder External Rotation) Level 2 (Red)  External Rotation Limitations cues for scap retraction               [facility]) - 11/23/21 1613     PWR! exercises Moves in sitting    PWR! Up x10    PWR! Rock x10    PWR! Twist x10    PWR! Step x10                    PT Short Term Goals - 11/17/21 1508       PT SHORT TERM GOAL #1   Title Patient will be independent with initial HEP    Status Achieved   11/12/21   Target Date 11/16/21      PT SHORT TERM GOAL #2   Title Patient will demonstrate decreased tightness/spasm in the L upper trap/ levator scapulae to decrease pain to </= 5/10 with movement and promote improved cervical and L shoulder mobility    Status Achieved   11/12/21 - Pain typically only 2-3/10 at rest and no more than 5/10 with activity   Target Date 11/16/21      PT SHORT TERM GOAL #3   Title Patient will be able to verbalize and demonstrate proper posture with sitting/standing to decrease/reduce muscle tightness/reinjury    Status Achieved   11/17/21   Target Date 11/16/21      PT SHORT TERM GOAL #4   Title Establish further STGs/LTGs as indicated based on assessment of deficits related to Parkinson's disease    Status Achieved   10/26/21              PT Long Term Goals - 11/17/21 1509       PT LONG TERM GOAL #1   Title Patient will be independent with ongoing/advanced HEP for self-management at home    Status On-going    Target Date 12/14/21      PT LONG TERM GOAL #2   Title Patient to improve L shoudler AROM to [facility] without pain provocation to allow for increased ease of UB dressing    Status On-going    Target Date 12/14/21      PT LONG TERM GOAL #3    Title Patient to improve cervical AROM by >/= 25% with pain dereased to </= 5/10 with movement to allow for increased participation in church services and improve safety with driving    Status On-going    Target Date 12/14/21      PT LONG TERM GOAL #4   Title Patient will improve FGA score to >/= 25/30 to improve gait stability and reduce risk for falls    Baseline 20/30    Status On-going    Target Date 12/14/21      PT LONG TERM GOAL #5   Title Patient will verbalize understanding of local Parkinson's disease resources.    Status On-going    Target Date 12/14/21                   Plan - 11/23/21 1657     Clinical Impression Statement Mahogani reports sleeping with a pillow under her knees while lying on her back has taken care of her back pain at night. She reports limited standing/walking tolerance today due to pain in her R great toe from surgical removal of a plantar wart last week, therefore targeted exercises from a seated position today to minimize weight on her toe. Able to progress shoulder rows/extension resistance to red TB with band provided for home use.  She notes she has not yest attempted the seated PWR! Moves at home due to busy with getting her furnace fixed and other issues but able to provide good return demonstration during review/performance today.    Comorbidities R TKA 07/2019, B THR 2018 & 2017, cervical fusion 02/23/17 (Posterior cervical approach for a C2 neurectomy on the right side, posterior fusion C1 and C2 with structural allograft and morselized allograft, transarticular screw fixation), h/o L frozen shoulder per pt report, rupture of flexor tendon of L hand with failed surgical repair 12/12/18, Parkinsons disease, bladder cancer, HTN, scoliosis, lumbar spondylolisthesis cervical spondylosis, depression with anxiety    Rehab Potential Good    PT Frequency 2x / week    PT Duration 8 weeks    PT Treatment/Interventions ADLs/Self Care Home  Management;Cryotherapy;Electrical Stimulation;Iontophoresis 4mg /ml Dexamethasone;Moist Heat;Ultrasound;DME Instruction;Gait training;Stair training;Functional mobility training;Therapeutic activities;Therapeutic exercise;Balance training;Neuromuscular re-education;Patient/family education;Manual techniques;Passive range of motion;Dry needling;Taping;Vasopneumatic Device;Spinal Manipulations;Joint Manipulations    PT Next Visit Plan progress postural stretching and strengthening along with cervical and shoulder AROM; review and update HEP PRN; provide info on home TENS unit per pt request    PT Home Exercise Plan Access Code: [email or web address] (12/5, updated 12/12, 12/15 & 1/3); PWR! Moves: Seated (1/5)    Consulted and Agree with Plan of Care Patient             Patient will benefit from skilled therapeutic intervention in order to improve the following deficits and impairments:  Abnormal gait, Decreased activity tolerance, Decreased balance, Decreased knowledge of use of DME, Decreased mobility, Decreased range of motion, Decreased safety awareness, Decreased strength, Difficulty walking, Increased fascial restricitons, Increased muscle spasms, Impaired perceived functional ability, Impaired flexibility, Impaired UE functional use, Improper body mechanics, Postural dysfunction, Pain  Visit Diagnosis: Chronic left shoulder pain  Stiffness of left shoulder, not elsewhere classified  Cervicalgia  Pain in thoracic spine  Abnormal posture  Muscle weakness (generalized)  Unsteadiness on feet  Other abnormalities of gait and mobility     Problem List Patient Active Problem List   Diagnosis Date Noted   Left shoulder pain 09/14/2021   COVID-19 01/06/2021   Diarrhea 09/03/2020   Vitamin D deficiency 09/03/2020   OA (osteoarthritis) of knee 07/30/2019   Tinea corporis 06/08/2019   Psoriatic arthritis ([place]) 04/02/2019   Anemia 03/13/2018   Hot flashes 03/13/2018   S/P cervical spinal fusion  03/31/2017   Neck pain 12/12/2016   Depression with anxiety 10/17/2016   Bladder cancer ([place]) 10/17/2016   OA (osteoarthritis) of hip 07/07/2016   Spinal stenosis of lumbar region with radiculopathy 04/08/2016   Spondylolisthesis of lumbar region 04/08/2016   Spondylosis of cervical region without myelopathy or radiculopathy 04/08/2016   Preventative health care 01/18/2016   Low back pain 01/18/2016   History of chicken pox    Hyperlipidemia 12/15/2014   Allergic rhinitis 06/25/2014   Allergy to bee sting 06/10/2014   TMJ tenderness 11/16/2013   Other malaise and fatigue 05/07/2013   Arthropathy of cervical spine ([place]) 11/27/2012   Muscle spasms of neck 11/27/2012   Parkinson's disease ([place]) 05/24/2012   Conjunctivochalasis 03/09/2012   Epiretinal membrane 03/09/2012   Hyperopia with astigmatism and presbyopia 03/09/2012   Pseudophakia 03/09/2012   HTN (hypertension) 08/31/2011   Right knee pain 08/31/2011   Benign hypertensive heart disease without heart failure 05/12/2011    Percival Spanish, PT 11/23/2021, 5:00 PM  [facility] [address]  [place], [place], [address]  Phone: [phone]   Fax:  [phone]  Name: KERYL GHOLSON MRN: [id number] Date of Birth: 1932/03/12

## 2021-11-26 ENCOUNTER — Other Ambulatory Visit: Payer: Self-pay

## 2021-11-26 ENCOUNTER — Ambulatory Visit: Payer: Medicare Other | Admitting: Physical Therapy

## 2021-11-26 ENCOUNTER — Encounter: Payer: Self-pay | Admitting: Physical Therapy

## 2021-11-26 DIAGNOSIS — M542 Cervicalgia: Secondary | ICD-10-CM | POA: Diagnosis not present

## 2021-11-26 DIAGNOSIS — M6281 Muscle weakness (generalized): Secondary | ICD-10-CM

## 2021-11-26 DIAGNOSIS — G8929 Other chronic pain: Secondary | ICD-10-CM

## 2021-11-26 DIAGNOSIS — M546 Pain in thoracic spine: Secondary | ICD-10-CM

## 2021-11-26 DIAGNOSIS — R293 Abnormal posture: Secondary | ICD-10-CM | POA: Diagnosis not present

## 2021-11-26 DIAGNOSIS — M25612 Stiffness of left shoulder, not elsewhere classified: Secondary | ICD-10-CM

## 2021-11-26 DIAGNOSIS — R2689 Other abnormalities of gait and mobility: Secondary | ICD-10-CM

## 2021-11-26 DIAGNOSIS — M25512 Pain in left shoulder: Secondary | ICD-10-CM | POA: Diagnosis not present

## 2021-11-26 DIAGNOSIS — R2681 Unsteadiness on feet: Secondary | ICD-10-CM

## 2021-11-26 NOTE — Therapy (Signed)
[facility] [address]  [place], [place], [address] Phone: [phone]   Fax:  [phone]  Physical Therapy Treatment / Progress Note  Patient Details  Name: Stephanie [name] MRN: [id number] Date of Birth: Jan 01, 1932 Referring Provider (PT): Penni Homans, MD  Progress Note  Reporting Period 10/19/2021 to 11/26/2021  See note below for Objective Data and Assessment of Progress/Goals.     Encounter Date: 11/26/2021   PT End of Session - 11/26/21 1547     Visit Number 10    Number of Visits 16    Date for PT Re-Evaluation 12/14/21    Authorization Type Medicare & BCBS    PT Start Time 9242    PT Stop Time 1621    PT Time Calculation (min) 40 min    Activity Tolerance Patient tolerated treatment well    Behavior During Therapy WFL for tasks assessed/performed             Past Medical History:  Diagnosis Date   Allergy    Anemia    past hx    Arthropathy of cervical spine 11/27/2012   Right  Xray at [facility]  On 04/08/2016  AP, lateral, and lateral flexion and extension views of the cervical spine are submitted for evaluation.   No acute fracture identified in the cervical spine.   There is redemonstration of approximately 2 mm of C3 on C4 anterolisthesis in neutral which slightly increases in flexion relative to neutral and extension. Slight C5 on C6 retrolisthesis does not change i   Benign hypertension without congestive heart failure    Benign mole    right hcets mole, bleeds when dried with towel   Bilateral dry eyes    Bladder cancer ([place]) 10/18/16   Cervical spondylosis    Cervicalgia    2 screws in place    Chronic kidney disease    kidney stones    Depression with anxiety October 18, 2016   prior to husbands death    Gross hematuria    developed after first hip replacment, led to indicental finding of bladder tumor, bleeding now resolved    History of kidney stones    1970's   Left hand weakness     due to reinjury of flxor tendon s/p tendon surgery march 12-2018   Lumbar stenosis    Malignant tumor of urinary bladder ([place]) 07/2016   Pre-stage I   OA (osteoarthritis)    Parkinson disease [facility]) dx 2012   neurologist-  dr siddiqui at [place] [facility])     Dr Trudie Reed , [place] [facility]    Rupture of flexor tendon of hand 2020   right    Past Surgical History:  Procedure Laterality Date   BIOPSY MASS LEFT FIRST METACARPAL   06/23/2006   benign   CATARACT EXTRACTION W/ INTRAOCULAR LENS  IMPLANT, BILATERAL  1999 and 2003   CERVICAL FUSION  02/2017   c1-c2 ; reports she has 2 screws 2in long in place done at [facility] ; patient exhibits VERY LIMITED NECK ROM   COLONOSCOPY     CYSTOSCOPY W/ RETROGRADES Bilateral 09/22/2016   Procedure: CYSTOSCOPY WITH RETROGRADE PYELOGRAM;  Surgeon: Alexis Frock, MD;  Location: [facility];  Service: Urology;  Laterality: Bilateral;   D & C HYSTEROSCOPY W/ RESECTION POLYP  07/11/2000   DILATION AND CURETTAGE OF UTERUS  1960's   EXCISION CYST AND DEBRIDEMENT RIGHT WIRST AND REMOVAL FORGEIGN BODY  01/09/2009   FLEXOR TENDON REPAIR Left 12/12/2018   Procedure: REPAIR/TRANSFER FLEXOR DIGITORUM PROFUNDUS OF LEFT SMALL FINGER;  Surgeon: Daryll Brod, MD;  Location: [facility];  Service: Orthopedics;  Laterality: Left;   LAPAROSCOPY  yrs ago   infertility    METACARPOPHALANGEAL JOINT ARTHRODESIS Right 05/25/1996   TENDON REPAIR Left 01/15/2019   Hand   TONSILLECTOMY AND ADENOIDECTOMY  child   TOTAL HIP ARTHROPLASTY Left 07/07/2016   Procedure: LEFT TOTAL HIP ARTHROPLASTY ANTERIOR APPROACH;  Surgeon: Gaynelle Arabian, MD;  Location: [facility];  Service: Orthopedics;  Laterality: Left;   TOTAL HIP ARTHROPLASTY Right 09/28/2017   Procedure: RIGHT TOTAL HIP ARTHROPLASTY ANTERIOR APPROACH;  Surgeon: Gaynelle Arabian, MD;  Location: [facility];  Service: Orthopedics;  Laterality: Right;   TOTAL KNEE ARTHROPLASTY Right  07/30/2019   Procedure: TOTAL KNEE ARTHROPLASTY;  Surgeon: Gaynelle Arabian, MD;  Location: [facility];  Service: Orthopedics;  Laterality: Right;  51mn   TRANSURETHRAL RESECTION OF BLADDER TUMOR N/A 09/22/2016   Procedure: TRANSURETHRAL RESECTION OF BLADDER TUMOR (TURBT);  Surgeon: TAlexis Frock MD;  [facility]  Service: Urology;  Laterality: N/A;    There were no vitals filed for this visit.   Subjective Assessment - 11/26/21 1543     Subjective Pt reports her R knee has started hurting again, potentially due to the way she is still limping becuase of her R great toe. Her f/u with the podiatrist is on Monday.    Pertinent History R TKA 07/2019, B THR 2018 & 2017, posterior C1-2 cervical fusion 2018, rupture of flexor tendon of L hand with failed surgical repair 2020, Parkinsons, bladder cancer, HTN    Patient Stated Goals "less pain - able to dress upper body or put on a coat w/o pain"    Currently in Pain? Yes    Pain Score 4     Pain Orientation Right    Pain Descriptors / Indicators Sore    Pain Type Acute pain    Pain Score 3    Pain Location Toe (Comment which one)   great toe   Pain Orientation Right    Pain Descriptors / Indicators Sore                [facility] PT Assessment - 11/26/21 1541       Assessment   Medical Diagnosis Left shoulder pain, Neck & upper back pain, Psoriatic arthritis, Parkinson's disease    Referring Provider (PT) SPenni Homans MD    Next MD Visit 01/26/22      AROM   Left Shoulder Flexion 157 Degrees    Left Shoulder ABduction 156 Degrees    Left Shoulder Internal Rotation --   FIR to T12 but still painful   Left Shoulder External Rotation --   FER to T3   Cervical Flexion 52    Cervical Extension 54    Cervical - Right Side Bend 25    Cervical - Left Side Bend 16    Cervical - Right Rotation 45    Cervical - Left Rotation 41      Strength   Right Shoulder Flexion 4+/5    Right Shoulder ABduction 4+/5    Right  Shoulder Internal Rotation 4+/5    Right Shoulder External Rotation 4+/5    Left Shoulder Flexion 4+/5    Left Shoulder ABduction 4/5   pain   Left Shoulder Internal Rotation 4+/5    Left Shoulder External Rotation 4/5  PT Short Term Goals - 11/17/21 1508       PT SHORT TERM GOAL #1   Title Patient will be independent with initial HEP    Status Achieved   11/12/21   Target Date 11/16/21      PT SHORT TERM GOAL #2   Title Patient will demonstrate decreased tightness/spasm in the L upper trap/ levator scapulae to decrease pain to </= 5/10 with movement and promote improved cervical and L shoulder mobility    Status Achieved   11/12/21 - Pain typically only 2-3/10 at rest and no more than 5/10 with activity   Target Date 11/16/21      PT SHORT TERM GOAL #3   Title Patient will be able to verbalize and demonstrate proper posture with sitting/standing to decrease/reduce muscle tightness/reinjury    Status Achieved   11/17/21   Target Date 11/16/21      PT SHORT TERM GOAL #4   Title Establish further STGs/LTGs as indicated based on assessment of deficits related to Parkinson's disease    Status Achieved   10/26/21              PT Long Term Goals - 11/26/21 1549       PT LONG TERM GOAL #1   Title Patient will be independent with ongoing/advanced HEP for self-management at home    Status Partially Met   11/26/21 - met for current HEP   Target Date 12/14/21      PT LONG TERM GOAL #2   Title Patient to improve L shoudler AROM to [facility] without pain provocation to allow for increased ease of UB dressing    Status Partially Met   11/26/21 - L shoulder AROM now [place] but pain still noted with some motion   Target Date 12/14/21      PT LONG TERM GOAL #3   Title Patient to improve cervical AROM by >/= 25% with pain decreased to </= 5/10 with movement to allow for increased participation in church services and improve safety  with driving    Status Partially Met   11/26/21 - Pt reports 20% improvement in neck pain (0/10 at rest, but 2-3/10 with mobility and activity). Driving improved but she notes she still positions herself directly in front of the pulpit at church to avoid having to turn her head d/t pain.   Target Date 12/14/21      PT LONG TERM GOAL #4   Title Patient will improve FGA score to >/= 25/30 to improve gait stability and reduce risk for falls    Baseline 20/30    Status Unable to assess   11/26/21 - testing deferred due to R great toe and R knee pain limiting standing and walking tolerance   Target Date 12/14/21      PT LONG TERM GOAL #5   Title Patient will verbalize understanding of local Parkinson's disease resources.    Status Partially Met   11/26/21 - Pt added to [employer] Parkinson's email list and provided with print out of most recent email attachments   Target Date 12/14/21                   Plan - 11/26/21 1621     Clinical Impression Statement Stephanie [name] is progressing well with PT with gains noted in both cervical and L shoulder AROM as well as B shoulder strength. L shoulder pain now less common and AROM essentially equivalent to R shoulder but some pain still present  on resisted L shoulder abduction. She has more limited with mobility for the past week due to pain from removal of a plantar wart from her R great toe and today reports return of R knee pain, likely as a result of altered mobility and gait pattern due to R great toe pain as pt observed reverting to genu valgum during transfers. Reviewed proper alignment during transitional movements along with strengthening exercises to continue to promote improved proximal stability and knee alignment for reduced pain. Stephanie [name] is progressing well toward her goals with all STGs met and several LTGs at least partially met. Deferred reassessment of balance today due to limited tolerance for standing and gait secondary to R great toe and knee  pain. I anticipate she will continue to progress with her deficits related to her L shoulder pain, neck & upper back pain, psoriatic arthritis and Parkinson's disease but may benefit from recert at the end of her current POC.    Comorbidities R TKA 07/2019, B THR 2018 & 2017, cervical fusion 02/23/17 (Posterior cervical approach for a C2 neurectomy on the right side, posterior fusion C1 and C2 with structural allograft and morselized allograft, transarticular screw fixation), h/o L frozen shoulder per pt report, rupture of flexor tendon of L hand with failed surgical repair 12/12/18, Parkinsons disease, bladder cancer, HTN, scoliosis, lumbar spondylolisthesis cervical spondylosis, depression with anxiety    Rehab Potential Good    PT Frequency 2x / week    PT Duration 8 weeks    PT Treatment/Interventions ADLs/Self Care Home Management;Cryotherapy;Electrical Stimulation;Iontophoresis 43m/ml Dexamethasone;Moist Heat;Ultrasound;DME Instruction;Gait training;Stair training;Functional mobility training;Therapeutic activities;Therapeutic exercise;Balance training;Neuromuscular re-education;Patient/family education;Manual techniques;Passive range of motion;Dry needling;Taping;Vasopneumatic Device;Spinal Manipulations;Joint Manipulations    PT Next Visit Plan progress postural stretching and strengthening along with cervical and shoulder AROM; review and update HEP PRN; resume standing, balance and PWR! Moves activites as pain allows; provide info on home TENS unit per pt request    PT Home Exercise Plan Access Code: [email or web address](12/5, updated 12/12, 12/15 & 1/3); PWR! Moves: Seated (1/5)    Consulted and Agree with Plan of Care Patient             Patient will benefit from skilled therapeutic intervention in order to improve the following deficits and impairments:  Abnormal gait, Decreased activity tolerance, Decreased balance, Decreased knowledge of use of DME, Decreased mobility, Decreased range of motion,  Decreased safety awareness, Decreased strength, Difficulty walking, Increased fascial restricitons, Increased muscle spasms, Impaired perceived functional ability, Impaired flexibility, Impaired UE functional use, Improper body mechanics, Postural dysfunction, Pain  Visit Diagnosis: Chronic left shoulder pain  Stiffness of left shoulder, not elsewhere classified  Cervicalgia  Pain in thoracic spine  Abnormal posture  Muscle weakness (generalized)  Unsteadiness on feet  Other abnormalities of gait and mobility     Problem List Patient Active Problem List   Diagnosis Date Noted   Left shoulder pain 09/14/2021   COVID-19 01/06/2021   Diarrhea 09/03/2020   Vitamin D deficiency 09/03/2020   OA (osteoarthritis) of knee 07/30/2019   Tinea corporis 06/08/2019   [place]atic [place] 04/02/2019   Anemia 03/13/2018   Hot flashes 03/13/2018   S/P cervical spinal fusion 03/31/2017   Neck pain 12/12/2016   Depression with anxiety 10/17/2016   [place] 10/17/2016   OA (osteoarthritis) of hip 07/07/2016   Spinal stenosis of lumbar region with radiculopathy 04/08/2016   Spondylolisthesis of lumbar region 04/08/2016   Spondylosis of cervical region without myelopathy or radiculopathy 04/08/2016  Preventative health care 01/18/2016   Low back pain 01/18/2016   History of chicken pox    Hyperlipidemia 12/15/2014   Allergic rhinitis 06/25/2014   Allergy to bee sting 06/10/2014   TMJ tenderness 11/16/2013   Other malaise and fatigue 05/07/2013   Arthropathy of cervical spine ([place]) 11/27/2012   Muscle spasms of neck 11/27/2012   Parkinson's disease ([place]) 05/24/2012   Conjunctivochalasis 03/09/2012   Epiretinal membrane 03/09/2012   Hyperopia with astigmatism and presbyopia 03/09/2012   Pseudophakia 03/09/2012   HTN (hypertension) 08/31/2011   Right knee pain 08/31/2011   Benign hypertensive heart disease without heart failure 05/12/2011    Percival Spanish,  PT 11/26/2021, 8:05 PM  [facility] [address]  [place], [place], [address] Phone: [phone]   Fax:  [phone]  Name: BRADLEIGH SONNEN MRN: [id number] Date of Birth: 1932/01/22

## 2021-11-30 ENCOUNTER — Ambulatory Visit: Payer: Medicare Other | Admitting: Physical Therapy

## 2021-11-30 ENCOUNTER — Other Ambulatory Visit: Payer: Self-pay

## 2021-11-30 ENCOUNTER — Encounter: Payer: Self-pay | Admitting: Physical Therapy

## 2021-11-30 DIAGNOSIS — M6281 Muscle weakness (generalized): Secondary | ICD-10-CM

## 2021-11-30 DIAGNOSIS — M25512 Pain in left shoulder: Secondary | ICD-10-CM

## 2021-11-30 DIAGNOSIS — M546 Pain in thoracic spine: Secondary | ICD-10-CM

## 2021-11-30 DIAGNOSIS — M25612 Stiffness of left shoulder, not elsewhere classified: Secondary | ICD-10-CM | POA: Diagnosis not present

## 2021-11-30 DIAGNOSIS — M542 Cervicalgia: Secondary | ICD-10-CM | POA: Diagnosis not present

## 2021-11-30 DIAGNOSIS — G8929 Other chronic pain: Secondary | ICD-10-CM

## 2021-11-30 DIAGNOSIS — R293 Abnormal posture: Secondary | ICD-10-CM | POA: Diagnosis not present

## 2021-11-30 DIAGNOSIS — R2681 Unsteadiness on feet: Secondary | ICD-10-CM

## 2021-11-30 DIAGNOSIS — R2689 Other abnormalities of gait and mobility: Secondary | ICD-10-CM

## 2021-11-30 NOTE — Therapy (Signed)
[facility] [address]  [place], [place], [address] Phone: [phone]   Fax:  [phone]  Physical Therapy Treatment  Patient Details  Name: Stephanie [name] MRN: [id number] Date of Birth: 1932-08-21 Referring Provider (PT): Penni Homans, MD   Encounter Date: 11/30/2021   PT End of Session - 11/30/21 1447     Visit Number 11    Number of Visits 16    Date for PT Re-Evaluation 12/14/21    Authorization Type Medicare & BCBS    PT Start Time 1447    PT Stop Time 1529    PT Time Calculation (min) 42 min    Activity Tolerance Patient tolerated treatment well    Behavior During Therapy [facility] for tasks assessed/performed             Past Medical History:  Diagnosis Date   Allergy    Anemia    past hx    Arthropathy of cervical spine 11/27/2012   Right  Xray at [facility]  On 04/08/2016  AP, lateral, and lateral flexion and extension views of the cervical spine are submitted for evaluation.   No acute fracture identified in the cervical spine.   There is redemonstration of approximately 2 mm of C3 on C4 anterolisthesis in neutral which slightly increases in flexion relative to neutral and extension. Slight C5 on C6 retrolisthesis does not change i   Benign hypertension without congestive heart failure    Benign mole    right hcets mole, bleeds when dried with towel   Bilateral dry eyes    Bladder cancer ([place]) 10-24-2016   Cervical spondylosis    Cervicalgia    2 screws in place    Chronic kidney disease    kidney stones    Depression with anxiety 10-24-16   prior to husbands death    Gross hematuria    developed after first hip replacment, led to indicental finding of bladder tumor, bleeding now resolved    History of kidney stones    1970's   Left hand weakness    due to reinjury of flxor tendon s/p tendon surgery march 12-2018   Lumbar stenosis    Malignant tumor of urinary bladder ([place]) 07/2016   Pre-stage I    OA (osteoarthritis)    Parkinson disease [facility]) dx 2012   neurologist-  dr siddiqui at [facility])     Dr Trudie Reed , [facility]    Rupture of flexor tendon of hand 2020   right    Past Surgical History:  Procedure Laterality Date   BIOPSY MASS LEFT FIRST METACARPAL   06/23/2006   benign   CATARACT EXTRACTION W/ INTRAOCULAR LENS  IMPLANT, BILATERAL  1999 and 2003   CERVICAL FUSION  02/2017   c1-c2 ; reports she has 2 screws 2in long in place done at [facility] ; patient exhibits VERY LIMITED NECK ROM   COLONOSCOPY     CYSTOSCOPY W/ RETROGRADES Bilateral 09/22/2016   Procedure: CYSTOSCOPY WITH RETROGRADE PYELOGRAM;  Surgeon: Alexis Frock, MD;  Location: [facility];  Service: Urology;  Laterality: Bilateral;   D & C HYSTEROSCOPY W/ RESECTION POLYP  07/11/2000   DILATION AND CURETTAGE OF UTERUS  1960's   EXCISION CYST AND DEBRIDEMENT RIGHT WIRST AND REMOVAL FORGEIGN BODY  01/09/2009   FLEXOR TENDON REPAIR Left 12/12/2018   Procedure: REPAIR/TRANSFER FLEXOR DIGITORUM PROFUNDUS OF LEFT SMALL FINGER;  Surgeon: Daryll Brod, MD;  Location: [facility];  Service: Orthopedics;  Laterality: Left;   LAPAROSCOPY  yrs ago   infertility    METACARPOPHALANGEAL JOINT ARTHRODESIS Right 05/25/1996   TENDON REPAIR Left 01/15/2019   Hand   TONSILLECTOMY AND ADENOIDECTOMY  child   TOTAL HIP ARTHROPLASTY Left 07/07/2016   Procedure: LEFT TOTAL HIP ARTHROPLASTY ANTERIOR APPROACH;  Surgeon: Gaynelle Arabian, MD;  Location: [facility];  Service: Orthopedics;  Laterality: Left;   TOTAL HIP ARTHROPLASTY Right 09/28/2017   Procedure: RIGHT TOTAL HIP ARTHROPLASTY ANTERIOR APPROACH;  Surgeon: Gaynelle Arabian, MD;  Location: [facility];  Service: Orthopedics;  Laterality: Right;   TOTAL KNEE ARTHROPLASTY Right 07/30/2019   Procedure: TOTAL KNEE ARTHROPLASTY;  Surgeon: Gaynelle Arabian, MD;  Location: [facility];  Service: Orthopedics;  Laterality: Right;  60mn    TRANSURETHRAL RESECTION OF BLADDER TUMOR N/A 09/22/2016   Procedure: TRANSURETHRAL RESECTION OF BLADDER TUMOR (TURBT);  Surgeon: TAlexis Frock MD;  [facility]  Service: Urology;  Laterality: N/A;    There were no vitals filed for this visit.   Subjective Assessment - 11/30/21 1449     Subjective Pt reports the podiatrist said her toe is healing as expected but will likely continue to drain and remain sore for anouther week - less limping today and knee pain improving. Pt noting increased neck and shoulder/upper arm last night which woke her up.    Pertinent History R TKA 07/2019, B THR 2018 & 2017, posterior C1-2 cervical fusion 2018, rupture of flexor tendon of L hand with failed surgical repair 2020, Parkinsons, bladder cancer, HTN    Patient Stated Goals "less pain - able to dress upper body or put on a coat w/o pain"    Currently in Pain? Yes    Pain Score 6    5-6/10   Pain Location Shoulder    Pain Orientation Left    Pain Descriptors / Indicators Sore    Pain Type Acute pain;Chronic pain    Pain Score 2    Pain Location Knee    Pain Orientation Right    Pain Descriptors / Indicators Dull;Sore    Pain Frequency Intermittent                               [facility] Adult PT Treatment/Exercise - 11/30/21 1447       Neck Exercises: Seated   Shoulder Rolls Backwards;10 reps      Knee/Hip Exercises: Aerobic   Nustep L5 x 7 min (UE/LE to promote reciprocal motion)      Shoulder Exercises: Seated   Retraction Both;10 reps;AROM;Strengthening    Retraction Limitations + slight depression      Manual Therapy   Manual Therapy Joint mobilization;Soft tissue mobilization;Myofascial release;Scapular mobilization    Joint Mobilization grade I-II L shoulder distraction for pain relief    Soft tissue mobilization STM/DTM to L pecs, deltoids, UT, LS, teres group and medial scapular muscles    Myofascial Release manual TPR to L pec minor,  lateral deltoid, UT, LS, teres group, subscapularis and rhomboids    Scapular Mobilization L scapular mobilization - all directions with subscapular release                       PT Short Term Goals - 11/17/21 1508       PT SHORT TERM GOAL #1   Title Patient will be independent with initial HEP  Status Achieved   11/12/21   Target Date 11/16/21      PT SHORT TERM GOAL #2   Title Patient will demonstrate decreased tightness/spasm in the L upper trap/ levator scapulae to decrease pain to </= 5/10 with movement and promote improved cervical and L shoulder mobility    Status Achieved   11/12/21 - Pain typically only 2-3/10 at rest and no more than 5/10 with activity   Target Date 11/16/21      PT SHORT TERM GOAL #3   Title Patient will be able to verbalize and demonstrate proper posture with sitting/standing to decrease/reduce muscle tightness/reinjury    Status Achieved   11/17/21   Target Date 11/16/21      PT SHORT TERM GOAL #4   Title Establish further STGs/LTGs as indicated based on assessment of deficits related to Parkinson's disease    Status Achieved   10/26/21              PT Long Term Goals - 11/26/21 1549       PT LONG TERM GOAL #1   Title Patient will be independent with ongoing/advanced HEP for self-management at home    Status Partially Met   11/26/21 - met for current HEP   Target Date 12/14/21      PT LONG TERM GOAL #2   Title Patient to improve L shoudler AROM to [facility] without pain provocation to allow for increased ease of UB dressing    Status Partially Met   11/26/21 - L shoulder AROM now [facility] but pain still noted with some motion   Target Date 12/14/21      PT LONG TERM GOAL #3   Title Patient to improve cervical AROM by >/= 25% with pain decreased to </= 5/10 with movement to allow for increased participation in church services and improve safety with driving    Status Partially Met   11/26/21 - Pt reports 20% improvement in neck pain (0/10  at rest, but 2-3/10 with mobility and activity). Driving improved but she notes she still positions herself directly in front of the pulpit at church to avoid having to turn her head d/t pain.   Target Date 12/14/21      PT LONG TERM GOAL #4   Title Patient will improve FGA score to >/= 25/30 to improve gait stability and reduce risk for falls    Baseline 20/30    Status Unable to assess   11/26/21 - testing deferred due to R great toe and R knee pain limiting standing and walking tolerance   Target Date 12/14/21      PT LONG TERM GOAL #5   Title Patient will verbalize understanding of local Parkinson's disease resources.    Status Partially Met   11/26/21 - Pt added to [employer]'s email list and provided with print out of most recent email attachments   Target Date 12/14/21                   Plan - 11/30/21 1529     Clinical Impression Statement Tesia reports her R great toe is healing allowing her to walk with less of a limp which along with improved awareness of her knee alignment has helped reduce her R knee pain. Unfortunately, today her L shoulder complex seems to be more painful with increased muscle guarding and multiple TPs identified t/o her pecs, deltoids, UT, LS and periscapular muscles. Addressed abnormal muscle tension and TPs with manual STM/DTM, manual  TPR and scapular mobilization with pt noting reduction in pain. Encouraged increased scapular retraction and depression to promote better alignment and reduce likelihood for muscle spasms. Offered thermal modalities to promote further muscle relaxation and pain relief but pt opting to use her heating pad at home.    Comorbidities R TKA 07/2019, B THR 2018 & 2017, cervical fusion 02/23/17 (Posterior cervical approach for a C2 neurectomy on the right side, posterior fusion C1 and C2 with structural allograft and morselized allograft, transarticular screw fixation), h/o L frozen shoulder per pt report, rupture of flexor  tendon of L hand with failed surgical repair 12/12/18, Parkinsons disease, bladder cancer, HTN, scoliosis, lumbar spondylolisthesis cervical spondylosis, depression with anxiety    Rehab Potential Good    PT Frequency 2x / week    PT Duration 8 weeks    PT Treatment/Interventions ADLs/Self Care Home Management;Cryotherapy;Electrical Stimulation;Iontophoresis 48m/ml Dexamethasone;Moist Heat;Ultrasound;DME Instruction;Gait training;Stair training;Functional mobility training;Therapeutic activities;Therapeutic exercise;Balance training;Neuromuscular re-education;Patient/family education;Manual techniques;Passive range of motion;Dry needling;Taping;Vasopneumatic Device;Spinal Manipulations;Joint Manipulations    PT Next Visit Plan progress postural stretching and strengthening along with cervical and shoulder AROM; review and update HEP PRN; resume standing, balance and PWR! Moves activites as pain allows; provide info on home TENS unit per pt request    PT Home Exercise Plan Access Code: [email or web address](12/5, updated 12/12, 12/15 & 1/3); PWR! Moves: Seated (1/5)    Consulted and Agree with Plan of Care Patient             Patient will benefit from skilled therapeutic intervention in order to improve the following deficits and impairments:  Abnormal gait, Decreased activity tolerance, Decreased balance, Decreased knowledge of use of DME, Decreased mobility, Decreased range of motion, Decreased safety awareness, Decreased strength, Difficulty walking, Increased fascial restricitons, Increased muscle spasms, Impaired perceived functional ability, Impaired flexibility, Impaired UE functional use, Improper body mechanics, Postural dysfunction, Pain  Visit Diagnosis: Chronic left shoulder pain  Stiffness of left shoulder, not elsewhere classified  Cervicalgia  Pain in thoracic spine  Abnormal posture  Muscle weakness (generalized)  Unsteadiness on feet  Other abnormalities of gait and  mobility     Problem List Patient Active Problem List   Diagnosis Date Noted   Left shoulder pain 09/14/2021   COVID-19 01/06/2021   Diarrhea 09/03/2020   Vitamin D deficiency 09/03/2020   OA (osteoarthritis) of knee 07/30/2019   Tinea corporis 06/08/2019   Psoriatic arthritis ([place] 04/02/2019   Anemia 03/13/2018   Hot flashes 03/13/2018   S/P cervical spinal fusion 03/31/2017   Neck pain 12/12/2016   Depression with anxiety 10/17/2016   Bladder cancer ([place] 10/17/2016   OA (osteoarthritis) of hip 07/07/2016   Spinal stenosis of lumbar region with radiculopathy 04/08/2016   Spondylolisthesis of lumbar region 04/08/2016   Spondylosis of cervical region without myelopathy or radiculopathy 04/08/2016   Preventative health care 01/18/2016   Low back pain 01/18/2016   History of chicken pox    Hyperlipidemia 12/15/2014   Allergic rhinitis 06/25/2014   Allergy to bee sting 06/10/2014   TMJ tenderness 11/16/2013   Other malaise and fatigue 05/07/2013   Arthropathy of cervical spine ([place] 11/27/2012   Muscle spasms of neck 11/27/2012   [place]inson's [place] 05/24/2012   Conjunctivochalasis 03/09/2012   Epiretinal membrane 03/09/2012   Hyperopia with astigmatism and presbyopia 03/09/2012   Pseudophakia 03/09/2012   HTN (hypertension) 08/31/2011   Right knee pain 08/31/2011   Benign hypertensive heart disease without heart failure 05/12/2011    JPercival Spanish  PT 11/30/2021, 3:49 PM  [facility] [address]  [place], [place], [address] Phone: [phone]   Fax:  [phone]  Name: RANDY WHITENER MRN: [id number] Date of Birth: Apr 27, 1932

## 2021-12-03 ENCOUNTER — Other Ambulatory Visit: Payer: Self-pay

## 2021-12-03 ENCOUNTER — Ambulatory Visit: Payer: Medicare Other | Admitting: Physical Therapy

## 2021-12-03 ENCOUNTER — Encounter: Payer: Self-pay | Admitting: Physical Therapy

## 2021-12-03 DIAGNOSIS — M25512 Pain in left shoulder: Secondary | ICD-10-CM | POA: Diagnosis not present

## 2021-12-03 DIAGNOSIS — L409 Psoriasis, unspecified: Secondary | ICD-10-CM | POA: Diagnosis not present

## 2021-12-03 DIAGNOSIS — M154 Erosive (osteo)arthritis: Secondary | ICD-10-CM | POA: Diagnosis not present

## 2021-12-03 DIAGNOSIS — M150 Primary generalized (osteo)arthritis: Secondary | ICD-10-CM | POA: Diagnosis not present

## 2021-12-03 DIAGNOSIS — Z6822 Body mass index (BMI) 22.0-22.9, adult: Secondary | ICD-10-CM | POA: Diagnosis not present

## 2021-12-03 DIAGNOSIS — R2681 Unsteadiness on feet: Secondary | ICD-10-CM

## 2021-12-03 DIAGNOSIS — R293 Abnormal posture: Secondary | ICD-10-CM | POA: Diagnosis not present

## 2021-12-03 DIAGNOSIS — M542 Cervicalgia: Secondary | ICD-10-CM | POA: Diagnosis not present

## 2021-12-03 DIAGNOSIS — M546 Pain in thoracic spine: Secondary | ICD-10-CM

## 2021-12-03 DIAGNOSIS — R2689 Other abnormalities of gait and mobility: Secondary | ICD-10-CM

## 2021-12-03 DIAGNOSIS — M25612 Stiffness of left shoulder, not elsewhere classified: Secondary | ICD-10-CM | POA: Diagnosis not present

## 2021-12-03 DIAGNOSIS — G8929 Other chronic pain: Secondary | ICD-10-CM

## 2021-12-03 DIAGNOSIS — M2550 Pain in unspecified joint: Secondary | ICD-10-CM | POA: Diagnosis not present

## 2021-12-03 DIAGNOSIS — M6281 Muscle weakness (generalized): Secondary | ICD-10-CM

## 2021-12-03 DIAGNOSIS — M792 Neuralgia and neuritis, unspecified: Secondary | ICD-10-CM | POA: Diagnosis not present

## 2021-12-03 DIAGNOSIS — Z79899 Other long term (current) drug therapy: Secondary | ICD-10-CM | POA: Diagnosis not present

## 2021-12-03 DIAGNOSIS — L4050 Arthropathic psoriasis, unspecified: Secondary | ICD-10-CM | POA: Diagnosis not present

## 2021-12-03 NOTE — Therapy (Signed)
[facility] [address]  [place], [place], [address] Phone: [phone]   Fax:  [phone]  Physical Therapy Treatment  Patient Details  Name: Stephanie [name] MRN: [id number] Date of Birth: 1932-09-26 Referring Provider (PT): Penni Homans, MD   Encounter Date: 12/03/2021   PT End of Session - 12/03/21 1409     Visit Number 12    Number of Visits 16    Date for PT Re-Evaluation 12/14/21    Authorization Type Medicare & BCBS    PT Start Time 1409    PT Stop Time 1962    PT Time Calculation (min) 40 min    Activity Tolerance Patient tolerated treatment well    Behavior During Therapy [facility] for tasks assessed/performed             Past Medical History:  Diagnosis Date   Allergy    Anemia    past hx    Arthropathy of cervical spine 11/27/2012   Right  Xray at [facility]  On 04/08/2016  AP, lateral, and lateral flexion and extension views of the cervical spine are submitted for evaluation.   No acute fracture identified in the cervical spine.   There is redemonstration of approximately 2 mm of C3 on C4 anterolisthesis in neutral which slightly increases in flexion relative to neutral and extension. Slight C5 on C6 retrolisthesis does not change i   Benign hypertension without congestive heart failure    Benign mole    right hcets mole, bleeds when dried with towel   Bilateral dry eyes    Bladder cancer ([place]) Nov 16, 2016   Cervical spondylosis    Cervicalgia    2 screws in place    Chronic kidney disease    kidney stones    Depression with anxiety 11/16/2016   prior to husbands death    Gross hematuria    developed after first hip replacment, led to indicental finding of bladder tumor, bleeding now resolved    History of kidney stones    1970's   Left hand weakness    due to reinjury of flxor tendon s/p tendon surgery march 12-2018   Lumbar stenosis    Malignant tumor of urinary bladder ([place]) 07/2016   Pre-stage I    OA (osteoarthritis)    Parkinson disease [facility]) dx 2012   neurologist-  dr siddiqui at [facility])     Dr Trudie Reed , [place] [facility]    Rupture of flexor tendon of hand 2020   right    Past Surgical History:  Procedure Laterality Date   BIOPSY MASS LEFT FIRST METACARPAL   06/23/2006   benign   CATARACT EXTRACTION W/ INTRAOCULAR LENS  IMPLANT, BILATERAL  1999 and 2003   CERVICAL FUSION  02/2017   c1-c2 ; reports she has 2 screws 2in long in place done at [facility] ; patient exhibits VERY LIMITED NECK ROM   COLONOSCOPY     CYSTOSCOPY W/ RETROGRADES Bilateral 09/22/2016   Procedure: CYSTOSCOPY WITH RETROGRADE PYELOGRAM;  Surgeon: Alexis Frock, MD;  Location: [facility];  Service: Urology;  Laterality: Bilateral;   D & C HYSTEROSCOPY W/ RESECTION POLYP  07/11/2000   DILATION AND CURETTAGE OF UTERUS  1960's   EXCISION CYST AND DEBRIDEMENT RIGHT WIRST AND REMOVAL FORGEIGN BODY  01/09/2009   FLEXOR TENDON REPAIR Left 12/12/2018   Procedure: REPAIR/TRANSFER FLEXOR DIGITORUM PROFUNDUS OF LEFT SMALL FINGER;  Surgeon: Daryll Brod, MD;  Location: [facility];  Service: Orthopedics;  Laterality: Left;   LAPAROSCOPY  yrs ago   infertility    METACARPOPHALANGEAL JOINT ARTHRODESIS Right 05/25/1996   TENDON REPAIR Left 01/15/2019   Hand   TONSILLECTOMY AND ADENOIDECTOMY  child   TOTAL HIP ARTHROPLASTY Left 07/07/2016   Procedure: LEFT TOTAL HIP ARTHROPLASTY ANTERIOR APPROACH;  Surgeon: Gaynelle Arabian, MD;  Location: [facility];  Service: Orthopedics;  Laterality: Left;   TOTAL HIP ARTHROPLASTY Right 09/28/2017   Procedure: RIGHT TOTAL HIP ARTHROPLASTY ANTERIOR APPROACH;  Surgeon: Gaynelle Arabian, MD;  Location: [facility];  Service: Orthopedics;  Laterality: Right;   TOTAL KNEE ARTHROPLASTY Right 07/30/2019   Procedure: TOTAL KNEE ARTHROPLASTY;  Surgeon: Gaynelle Arabian, MD;  Location: [facility];  Service: Orthopedics;  Laterality: Right;  38mn    TRANSURETHRAL RESECTION OF BLADDER TUMOR N/A 09/22/2016   Procedure: TRANSURETHRAL RESECTION OF BLADDER TUMOR (TURBT);  Surgeon: TAlexis Frock MD;  [facility]  Service: Urology;  Laterality: N/A;    There were no vitals filed for this visit.   Subjective Assessment - 12/03/21 1411     Subjective Pt stating she brought her tennis ball and sock to make sure she is doing the self-STM correctly. No pain now but woke up in the middle of the night with pain in her L shoulder blade which made it difficult to go back to sleep.    Pertinent History R TKA 07/2019, B THR 2018 & 2017, posterior C1-2 cervical fusion 2018, rupture of flexor tendon of L hand with failed surgical repair 2020, Parkinsons, bladder cancer, HTN    Patient Stated Goals "less pain - able to dress upper body or put on a coat w/o pain"    Currently in Pain? No/denies                               [facility] 140[facility]       Knee/Hip Exercises: Aerobic   Recumbent Bike L2 x 6 min      Knee/Hip Exercises: Standing   Hip Flexion Right;Left;10 reps;Stengthening;Knee bent    Hip Flexion Limitations looped red TB at midfoot; UE support on back of chair    Hip Abduction Right;Left;10 reps;Stengthening;Knee straight    Abduction Limitations looped red TB at ankles; UE support on back of chair    Hip Extension Right;Left;10 reps;Stengthening;Knee straight    Extension Limitations looped red TB at ankles; UE support on back of chair      Knee/Hip Exercises: Seated   Sit to Sand 10 reps;without UE support   + red TB hip ABD isometric              PWR ([facility] - 12/03/21 1409     PWR! exercises Moves in standing    PWR! Up 2x10    PWR! Rock 2x10    PWR! Twist 2x10    PWR Step 2x10                  PT Education - 12/03/21 1448     Education Details Standing PWR! Moves    Person(s) Educated Patient    Methods  Explanation;Demonstration;Verbal cues;Handout    Comprehension Verbalized understanding;Verbal cues required;Returned demonstration;Need further instruction              PT Short Term Goals - 11/17/21 1508       PT SHORT TERM GOAL #1  Title Patient will be independent with initial HEP    Status Achieved   11/12/21   Target Date 11/16/21      PT SHORT TERM GOAL #2   Title Patient will demonstrate decreased tightness/spasm in the L upper trap/ levator scapulae to decrease pain to </= 5/10 with movement and promote improved cervical and L shoulder mobility    Status Achieved   11/12/21 - Pain typically only 2-3/10 at rest and no more than 5/10 with activity   Target Date 11/16/21      PT SHORT TERM GOAL #3   Title Patient will be able to verbalize and demonstrate proper posture with sitting/standing to decrease/reduce muscle tightness/reinjury    Status Achieved   11/17/21   Target Date 11/16/21      PT SHORT TERM GOAL #4   Title Establish further STGs/LTGs as indicated based on assessment of deficits related to Parkinson's disease    Status Achieved   10/26/21              PT Long Term Goals - 11/26/21 1549       PT LONG TERM GOAL #1   Title Patient will be independent with ongoing/advanced HEP for self-management at home    Status Partially Met   11/26/21 - met for current HEP   Target Date 12/14/21      PT LONG TERM GOAL #2   Title Patient to improve L shoudler AROM to [facility] without pain provocation to allow for increased ease of UB dressing    Status Partially Met   11/26/21 - L shoulder AROM now [facility] but pain still noted with some motion   Target Date 12/14/21      PT LONG TERM GOAL #3   Title Patient to improve cervical AROM by >/= 25% with pain decreased to </= 5/10 with movement to allow for increased participation in church services and improve safety with driving    Status Partially Met   11/26/21 - Pt reports 20% improvement in neck pain (0/10 at rest, but 2-3/10  with mobility and activity). Driving improved but she notes she still positions herself directly in front of the pulpit at church to avoid having to turn her head d/t pain.   Target Date 12/14/21      PT LONG TERM GOAL #4   Title Patient will improve FGA score to >/= 25/30 to improve gait stability and reduce risk for falls    Baseline 20/30    Status Unable to assess   11/26/21 - testing deferred due to R great toe and R knee pain limiting standing and walking tolerance   Target Date 12/14/21      PT LONG TERM GOAL #5   Title Patient will verbalize understanding of local Parkinson's disease resources.    Status Partially Met   11/26/21 - Pt added to [employer] email list and provided with print out of most recent email attachments   Target Date 12/14/21                   Plan - 12/03/21 1449     Clinical Impression Statement Kimimila brought her tennis ball and sock requesting review of self-STM for periscapular muscles - PT providing clarification of positioning avoiding pressure directly over spine as well as movement patterns to encourage TPR/decreased muscle tension with pt able to provide good return demonstration. She notes her toe pain is mostly resolved and feels ready to resume more standing activities, therefore  progressed PWR! Moves to introduce standing moves with pt able to perform good return demonstration - HEP instructions provided. Also progressed proximal LE strengthening in standing with red TB resisted hip strengthening but deferred addition to HEP d/t already adding standing PWR! Moves.    Comorbidities R TKA 07/2019, B THR 2018 & 2017, cervical fusion 02/23/17 (Posterior cervical approach for a C2 neurectomy on the right side, posterior fusion C1 and C2 with structural allograft and morselized allograft, transarticular screw fixation), h/o L frozen shoulder per pt report, rupture of flexor tendon of L hand with failed surgical repair 12/12/18, Parkinsons  disease, bladder cancer, HTN, scoliosis, lumbar spondylolisthesis cervical spondylosis, depression with anxiety    Rehab Potential Good    PT Frequency 2x / week    PT Duration 8 weeks    PT Treatment/Interventions ADLs/Self Care Home Management;Cryotherapy;Electrical Stimulation;Iontophoresis 4mg /ml Dexamethasone;Moist Heat;Ultrasound;DME Instruction;Gait training;Stair training;Functional mobility training;Therapeutic activities;Therapeutic exercise;Balance training;Neuromuscular re-education;Patient/family education;Manual techniques;Passive range of motion;Dry needling;Taping;Vasopneumatic Device;Spinal Manipulations;Joint Manipulations    PT Next Visit Plan progress postural stretching and strengthening along with cervical and shoulder AROM; review and update HEP PRN; resume standing, balance and PWR! Moves activites as pain allows; provide info on home TENS unit per pt request    PT Home Exercise Plan Access Code: [email or web address] (12/5, updated 12/12, 12/15 & 1/3); PWR! Moves: Seated (1/5), Standing (1/19)    Consulted and Agree with Plan of Care Patient             Patient will benefit from skilled therapeutic intervention in order to improve the following deficits and impairments:  Abnormal gait, Decreased activity tolerance, Decreased balance, Decreased knowledge of use of DME, Decreased mobility, Decreased range of motion, Decreased safety awareness, Decreased strength, Difficulty walking, Increased fascial restricitons, Increased muscle spasms, Impaired perceived functional ability, Impaired flexibility, Impaired UE functional use, Improper body mechanics, Postural dysfunction, Pain  Visit Diagnosis: Chronic left shoulder pain  Stiffness of left shoulder, not elsewhere classified  Cervicalgia  Pain in thoracic spine  Abnormal posture  Muscle weakness (generalized)  Unsteadiness on feet  Other abnormalities of gait and mobility     Problem List Patient Active Problem List    Diagnosis Date Noted   Left shoulder pain 09/14/2021   COVID-19 01/06/2021   Diarrhea 09/03/2020   Vitamin D deficiency 09/03/2020   OA (osteoarthritis) of knee 07/30/2019   Tinea corporis 06/08/2019   Psoriatic arthritis ([place]) 04/02/2019   Anemia 03/13/2018   Hot flashes 03/13/2018   S/P cervical spinal fusion 03/31/2017   Neck pain 12/12/2016   Depression with anxiety 10/17/2016   Bladder cancer ([place]) 10/17/2016   OA (osteoarthritis) of hip 07/07/2016   Spinal stenosis of lumbar region with radiculopathy 04/08/2016   Spondylolisthesis of lumbar region 04/08/2016   Spondylosis of cervical region without myelopathy or radiculopathy 04/08/2016   Preventative health care 01/18/2016   Low back pain 01/18/2016   History of chicken pox    Hyperlipidemia 12/15/2014   Allergic rhinitis 06/25/2014   Allergy to bee sting 06/10/2014   TMJ tenderness 11/16/2013   Other malaise and fatigue 05/07/2013   Arthropathy of cervical spine ([place]) 11/27/2012   Muscle spasms of neck 11/27/2012   Parkinson's disease ([place]) 05/24/2012   Conjunctivochalasis 03/09/2012   Epiretinal membrane 03/09/2012   Hyperopia with astigmatism and presbyopia 03/09/2012   Pseudophakia 03/09/2012   HTN (hypertension) 08/31/2011   Right knee pain 08/31/2011   Benign hypertensive heart disease without heart failure 05/12/2011    Percival Spanish, PT 12/03/2021,  4:44 PM  [facility] [address]  [place], [place], [address] Phone: [phone]   Fax:  [phone]  Name: GALI SPINNEY MRN: [id number] Date of Birth: [birth year]

## 2021-12-07 ENCOUNTER — Other Ambulatory Visit: Payer: Self-pay

## 2021-12-07 ENCOUNTER — Ambulatory Visit: Payer: Medicare Other | Admitting: Physical Therapy

## 2021-12-07 DIAGNOSIS — G8929 Other chronic pain: Secondary | ICD-10-CM

## 2021-12-07 DIAGNOSIS — R2689 Other abnormalities of gait and mobility: Secondary | ICD-10-CM

## 2021-12-07 DIAGNOSIS — M546 Pain in thoracic spine: Secondary | ICD-10-CM | POA: Diagnosis not present

## 2021-12-07 DIAGNOSIS — R2681 Unsteadiness on feet: Secondary | ICD-10-CM

## 2021-12-07 DIAGNOSIS — M25512 Pain in left shoulder: Secondary | ICD-10-CM | POA: Diagnosis not present

## 2021-12-07 DIAGNOSIS — M542 Cervicalgia: Secondary | ICD-10-CM

## 2021-12-07 DIAGNOSIS — R293 Abnormal posture: Secondary | ICD-10-CM

## 2021-12-07 DIAGNOSIS — M6281 Muscle weakness (generalized): Secondary | ICD-10-CM

## 2021-12-07 DIAGNOSIS — M25612 Stiffness of left shoulder, not elsewhere classified: Secondary | ICD-10-CM | POA: Diagnosis not present

## 2021-12-07 NOTE — Patient Instructions (Signed)
° ° °  Access Code: [email or web address] URL: [email or web address] Date: 12/07/2021 Prepared by: Annie Paras  Exercises Shoulder Rolls in Sitting - 2-3 x daily - 7 x weekly - 2 sets - 10 reps Seated Scapular Retraction - 2-3 x daily - 7 x weekly - 2 sets - 10 reps - 5 sec hold Doorway Pec Stretch at 60 Degrees Abduction with Arm Straight - 2-3 x daily - 7 x weekly - 3 reps - 30 sec hold Standing Bilateral Low Shoulder Row with Anchored Resistance - 1 x daily - 7 x weekly - 2 sets - 10 reps - 5 sec hold Scapular Retraction with Resistance Advanced - 1 x daily - 7 x weekly - 2 sets - 10 reps - 5 sec hold Seated Gentle Upper Trapezius Stretch - 2-3 x daily - 7 x weekly - 3 reps - 30 sec hold Gentle Levator Scapulae Stretch - 2-3 x daily - 7 x weekly - 3 reps - 30 sec hold Seated Rhomboid Stretch - 2-3 x daily - 7 x weekly - 3 reps - 30 sec hold Seated Hamstring Stretch - 2-3 x daily - 7 x weekly - 3 reps - 30 sec hold Seated Hip Flexor Stretch - 2-3 x daily - 7 x weekly - 3 reps - 30 sec hold Seated Isometric Hip Abduction with Resistance - 1 x daily - 7 x weekly - 2 sets - 10 reps - 3 sec hold Seated March with Resistance - 1 x daily - 7 x weekly - 2 sets - 10 reps - 3 sec hold Sit to Stand with Resistance Around Legs - 1 x daily - 7 x weekly - 2 sets - 5 reps Standing Lower Cervical and Upper Thoracic Stretch - 1 x daily - 7 x weekly - 3 reps - 30 sec hold Prone Single Arm Shoulder Horizontal Abduction with Scapular Retraction and Palm Down - 1 x daily - 7 x weekly - 2 sets - 10 reps - 3 sec hold Prone Shoulder Extension - Single Arm with Dumbbell - 1 x daily - 7 x weekly - 2 sets - 10 reps - 3 sec hold Seated Shoulder Diagonal with Resistance (Mirrored) - 1 x daily - 7 x weekly - 2 sets - 10 reps - 3 sec hold  Patient Education Biomedical scientist

## 2021-12-07 NOTE — Therapy (Signed)
[facility] [address]  [place], [place], [address] Phone: [phone]   Fax:  [phone]  Physical Therapy Treatment  Patient Details  Name: Stephanie [name] MRN: [id number] Date of Birth: 1932-01-16 Referring Provider (PT): Penni Homans, MD   Encounter Date: 12/07/2021   PT End of Session - 12/07/21 1534     Visit Number 13    Number of Visits 16    Date for PT Re-Evaluation 12/14/21    Authorization Type Medicare & BCBS    PT Start Time 1534    PT Stop Time 1616    PT Time Calculation (min) 42 min    Activity Tolerance Patient tolerated treatment well    Behavior During Therapy [facility] for tasks assessed/performed             Past Medical History:  Diagnosis Date   Allergy    Anemia    past hx    Arthropathy of cervical spine 11/27/2012   Right  Xray at [facility]  On 04/08/2016  AP, lateral, and lateral flexion and extension views of the cervical spine are submitted for evaluation.   No acute fracture identified in the cervical spine.   There is redemonstration of approximately 2 mm of C3 on C4 anterolisthesis in neutral which slightly increases in flexion relative to neutral and extension. Slight C5 on C6 retrolisthesis does not change i   Benign hypertension without congestive heart failure    Benign mole    right hcets mole, bleeds when dried with towel   Bilateral dry eyes    Bladder cancer ([place]) 11-15-2016   Cervical spondylosis    Cervicalgia    2 screws in place    Chronic kidney disease    kidney stones    Depression with anxiety November 15, 2016   prior to husbands death    Gross hematuria    developed after first hip replacment, led to indicental finding of bladder tumor, bleeding now resolved    History of kidney stones    1970's   Left hand weakness    due to reinjury of flxor tendon s/p tendon surgery march 12-2018   Lumbar stenosis    Malignant tumor of urinary bladder ([place]) 07/2016   Pre-stage I    OA (osteoarthritis)    Parkinson disease [facility]) dx 2012   neurologist-  dr siddiqui at [place] [facility])     Dr Trudie Reed , [place] [facility]    Rupture of flexor tendon of hand 2020   right    Past Surgical History:  Procedure Laterality Date   BIOPSY MASS LEFT FIRST METACARPAL   06/23/2006   benign   CATARACT EXTRACTION W/ INTRAOCULAR LENS  IMPLANT, BILATERAL  1999 and 2003   CERVICAL FUSION  02/2017   c1-c2 ; reports she has 2 screws 2in long in place done at [facility] ; patient exhibits VERY LIMITED NECK ROM   COLONOSCOPY     CYSTOSCOPY W/ RETROGRADES Bilateral 09/22/2016   Procedure: CYSTOSCOPY WITH RETROGRADE PYELOGRAM;  Surgeon: Alexis Frock, MD;  Location: [facility];  Service: Urology;  Laterality: Bilateral;   D & C HYSTEROSCOPY W/ RESECTION POLYP  07/11/2000   DILATION AND CURETTAGE OF UTERUS  1960's   EXCISION CYST AND DEBRIDEMENT RIGHT WIRST AND REMOVAL FORGEIGN BODY  01/09/2009   FLEXOR TENDON REPAIR Left 12/12/2018   Procedure: REPAIR/TRANSFER FLEXOR DIGITORUM PROFUNDUS OF LEFT SMALL FINGER;  Surgeon: Daryll Brod, MD;  Location: [facility];  Service: Orthopedics;  Laterality: Left;   LAPAROSCOPY  yrs ago   infertility    METACARPOPHALANGEAL JOINT ARTHRODESIS Right 05/25/1996   TENDON REPAIR Left 01/15/2019   Hand   TONSILLECTOMY AND ADENOIDECTOMY  child   TOTAL HIP ARTHROPLASTY Left 07/07/2016   Procedure: LEFT TOTAL HIP ARTHROPLASTY ANTERIOR APPROACH;  Surgeon: Gaynelle Arabian, MD;  Location: [facility];  Service: Orthopedics;  Laterality: Left;   TOTAL HIP ARTHROPLASTY Right 09/28/2017   Procedure: RIGHT TOTAL HIP ARTHROPLASTY ANTERIOR APPROACH;  Surgeon: Gaynelle Arabian, MD;  Location: [facility];  Service: Orthopedics;  Laterality: Right;   TOTAL KNEE ARTHROPLASTY Right 07/30/2019   Procedure: TOTAL KNEE ARTHROPLASTY;  Surgeon: Gaynelle Arabian, MD;  Location: [facility];  Service: Orthopedics;  Laterality: Right;  15mn    TRANSURETHRAL RESECTION OF BLADDER TUMOR N/A 09/22/2016   Procedure: TRANSURETHRAL RESECTION OF BLADDER TUMOR (TURBT);  Surgeon: TAlexis Frock MD;  [facility]  Service: Urology;  Laterality: N/A;    There were no vitals filed for this visit.   Subjective Assessment - 12/07/21 1539     Subjective Pt reports she tried the meloxicam for her OA and noted good relief of the pain in her fingers but ended up spending the morning in the bathroom - not sure if this was a reaction to the meds or something she may have eaten. She requested clarification of the exercises to help with pain in her shoulder blade area.    Pertinent History R TKA 07/2019, B THR 2018 & 2017, posterior C1-2 cervical fusion 2018, rupture of flexor tendon of L hand with failed surgical repair 2020, Parkinsons, bladder cancer, HTN    Patient Stated Goals "less pain - able to dress upper body or put on a coat w/o pain"    Currently in Pain? No/denies                               [facility] 12[facility]07[facility]21 1534       Neck Exercises: Stretches   Other Neck Stretches B rhomboid stretch with arms extended and hands clasped, rounding shoulders forward 2 x 30 sec   pt instructed to raise or lower her arms to achieve the stretch at the desired level     Knee/Hip Exercises: Aerobic   Nustep L6 x 6 min (UE/LE to promote reciprocal motion)      Shoulder Exercises: Seated   Diagonals Left;10 reps;Strengthening;Weights    Diagonals Weight (lbs) 1      Shoulder Exercises: Prone   Extension Left;10 reps;Strengthening    Extension Limitations "I"    Horizontal ABduction 1 Left;10 reps;Strengthening    Horizontal ABduction 1 Limitations "T"      Shoulder Exercises: Stretch   Cross Chest Stretch 1 rep;30 seconds    Cross Chest Stretch Limitations L rhomboid stretch with arm reaching crossbody and hand on doorframe                     PT Education -  12/07/21 1616     Education Details HEP review/updated targeting scapular stretching and strengthening - Access Code: [email or web address]   Person(s) Educated Patient    Methods Explanation;Demonstration;Verbal cues;Tactile cues;Handout    Comprehension Verbalized understanding;Verbal cues required;Tactile cues required;Returned demonstration;Need further instruction              PT Short Term Goals - 11/17/21 12458  PT SHORT TERM GOAL #1   Title Patient will be independent with initial HEP    Status Achieved   11/12/21   Target Date 11/16/21      PT SHORT TERM GOAL #2   Title Patient will demonstrate decreased tightness/spasm in the L upper trap/ levator scapulae to decrease pain to </= 5/10 with movement and promote improved cervical and L shoulder mobility    Status Achieved   11/12/21 - Pain typically only 2-3/10 at rest and no more than 5/10 with activity   Target Date 11/16/21      PT SHORT TERM GOAL #3   Title Patient will be able to verbalize and demonstrate proper posture with sitting/standing to decrease/reduce muscle tightness/reinjury    Status Achieved   11/17/21   Target Date 11/16/21      PT SHORT TERM GOAL #4   Title Establish further STGs/LTGs as indicated based on assessment of deficits related to Parkinson's disease    Status Achieved   10/26/21              PT Long Term Goals - 11/26/21 1549       PT LONG TERM GOAL #1   Title Patient will be independent with ongoing/advanced HEP for self-management at home    Status Partially Met   11/26/21 - met for current HEP   Target Date 12/14/21      PT LONG TERM GOAL #2   Title Patient to improve L shoudler AROM to [facility] without pain provocation to allow for increased ease of UB dressing    Status Partially Met   11/26/21 - L shoulder AROM now [facility] but pain still noted with some motion   Target Date 12/14/21      PT LONG TERM GOAL #3   Title Patient to improve cervical AROM by >/= 25% with pain decreased to </=  5/10 with movement to allow for increased participation in church services and improve safety with driving    Status Partially Met   11/26/21 - Pt reports 20% improvement in neck pain (0/10 at rest, but 2-3/10 with mobility and activity). Driving improved but she notes she still positions herself directly in front of the pulpit at church to avoid having to turn her head d/t pain.   Target Date 12/14/21      PT LONG TERM GOAL #4   Title Patient will improve FGA score to >/= 25/30 to improve gait stability and reduce risk for falls    Baseline 20/30    Status Unable to assess   11/26/21 - testing deferred due to R great toe and R knee pain limiting standing and walking tolerance   Target Date 12/14/21      PT LONG TERM GOAL #5   Title Patient will verbalize understanding of local Parkinson's disease resources.    Status Partially Met   11/26/21 - Pt added to [employer] email list and provided with print out of most recent email attachments   Target Date 12/14/21                   Plan - 12/07/21 1616     Clinical Impression Statement Stephanie [name] expressing desire to focus on stretches and exercises to address her ongoing intermittent periscapular pain as this continued to wake her and keep her from returning to sleep at night. Reviewed and modified rhomboid/medial scapular stretch with pt noting better stretch with shoulder protraction while arms extended/hands clasped. Strengthening options limited by  chronic issues with L wrist, but pt able to perform seated L shoulder diagonals with 1# and prone Is and Ts for scapular strengthening/stabilization with good tolerance - HEP revised/updated accordingly. Pt has 2 visits remaining in her current PT POC and feels that she will hopefully be ready to transition to her HEP at the end of her current POC, therefore will plan for continued review and finalization of her HEP next visit.    Comorbidities R TKA 07/2019, B THR 2018 & 2017, cervical  fusion 02/23/17 (Posterior cervical approach for a C2 neurectomy on the right side, posterior fusion C1 and C2 with structural allograft and morselized allograft, transarticular screw fixation), h/o L frozen shoulder per pt report, rupture of flexor tendon of L hand with failed surgical repair 12/12/18, Parkinsons disease, bladder cancer, HTN, scoliosis, lumbar spondylolisthesis cervical spondylosis, depression with anxiety    Rehab Potential Good    PT Frequency 2x / week    PT Duration 8 weeks    PT Treatment/Interventions ADLs/Self Care Home Management;Cryotherapy;Electrical Stimulation;Iontophoresis 28m/ml Dexamethasone;Moist Heat;Ultrasound;DME Instruction;Gait training;Stair training;Functional mobility training;Therapeutic activities;Therapeutic exercise;Balance training;Neuromuscular re-education;Patient/family education;Manual techniques;Passive range of motion;Dry needling;Taping;Vasopneumatic Device;Spinal Manipulations;Joint Manipulations    PT Next Visit Plan HEP review/update as indicated in prep for anticipated transition to HEP at end of current POC    PT Home Exercise Plan Access Code: [email or web address](12/5, updated 12/12, 12/15, 1/3 & 1/23); PWR! Moves: Seated (1/5), Standing (1/19)    Consulted and Agree with Plan of Care Patient             Patient will benefit from skilled therapeutic intervention in order to improve the following deficits and impairments:  Abnormal gait, Decreased activity tolerance, Decreased balance, Decreased knowledge of use of DME, Decreased mobility, Decreased range of motion, Decreased safety awareness, Decreased strength, Difficulty walking, Increased fascial restricitons, Increased muscle spasms, Impaired perceived functional ability, Impaired flexibility, Impaired UE functional use, Improper body mechanics, Postural dysfunction, Pain  Visit Diagnosis: Chronic left shoulder pain  Stiffness of left shoulder, not elsewhere classified  Cervicalgia  Pain in  thoracic spine  Abnormal posture  Muscle weakness (generalized)  Unsteadiness on feet  Other abnormalities of gait and mobility     Problem List Patient Active Problem List   Diagnosis Date Noted   Left shoulder pain 09/14/2021   COVID-19 01/06/2021   Diarrhea 09/03/2020   Vitamin D deficiency 09/03/2020   OA (osteoarthritis) of knee 07/30/2019   Tinea corporis 06/08/2019   Psoriatic arthritis ([place] 04/02/2019   Anemia 03/13/2018   Hot flashes 03/13/2018   S/P cervical spinal fusion 03/31/2017   Neck pain 12/12/2016   Depression with anxiety 10/17/2016   Bladder cancer ([place] 10/17/2016   OA (osteoarthritis) of hip 07/07/2016   Spinal stenosis of lumbar region with radiculopathy 04/08/2016   Spondylolisthesis of lumbar region 04/08/2016   Spondylosis of cervical region without myelopathy or radiculopathy 04/08/2016   Preventative health care 01/18/2016   Low back pain 01/18/2016   History of chicken pox    Hyperlipidemia 12/15/2014   Allergic rhinitis 06/25/2014   Allergy to bee sting 06/10/2014   TMJ tenderness 11/16/2013   Other malaise and fatigue 05/07/2013   Arthropathy of cervical spine ([place] 11/27/2012   Muscle spasms of neck 11/27/2012   Parkinson's disease ([place] 05/24/2012   Conjunctivochalasis 03/09/2012   Epiretinal membrane 03/09/2012   Hyperopia with astigmatism and presbyopia 03/09/2012   Pseudophakia 03/09/2012   HTN (hypertension) 08/31/2011   Right knee pain 08/31/2011   Benign hypertensive heart  disease without heart failure 05/12/2011    Percival Spanish, PT 12/07/2021, 8:01 PM  [facility] [address]  [place], [place], [address] Phone: [phone]   Fax:  [phone]  Name: Stephanie [name] MRN: [id number] Date of Birth: February 24, 1932

## 2021-12-10 ENCOUNTER — Ambulatory Visit: Payer: Medicare Other | Admitting: Physical Therapy

## 2021-12-14 ENCOUNTER — Ambulatory Visit: Payer: Medicare Other | Admitting: Physical Therapy

## 2021-12-17 DIAGNOSIS — D2271 Melanocytic nevi of right lower limb, including hip: Secondary | ICD-10-CM | POA: Diagnosis not present

## 2021-12-17 DIAGNOSIS — D225 Melanocytic nevi of trunk: Secondary | ICD-10-CM | POA: Diagnosis not present

## 2021-12-17 DIAGNOSIS — L570 Actinic keratosis: Secondary | ICD-10-CM | POA: Diagnosis not present

## 2021-12-17 DIAGNOSIS — B079 Viral wart, unspecified: Secondary | ICD-10-CM | POA: Diagnosis not present

## 2021-12-17 DIAGNOSIS — L578 Other skin changes due to chronic exposure to nonionizing radiation: Secondary | ICD-10-CM | POA: Diagnosis not present

## 2021-12-17 DIAGNOSIS — L821 Other seborrheic keratosis: Secondary | ICD-10-CM | POA: Diagnosis not present

## 2021-12-17 DIAGNOSIS — D2272 Melanocytic nevi of left lower limb, including hip: Secondary | ICD-10-CM | POA: Diagnosis not present

## 2022-01-05 ENCOUNTER — Ambulatory Visit: Payer: Medicare Other | Attending: Family Medicine | Admitting: Physical Therapy

## 2022-01-05 ENCOUNTER — Encounter: Payer: Self-pay | Admitting: Physical Therapy

## 2022-01-05 ENCOUNTER — Other Ambulatory Visit: Payer: Self-pay

## 2022-01-05 DIAGNOSIS — M25612 Stiffness of left shoulder, not elsewhere classified: Secondary | ICD-10-CM | POA: Insufficient documentation

## 2022-01-05 DIAGNOSIS — M25512 Pain in left shoulder: Secondary | ICD-10-CM | POA: Insufficient documentation

## 2022-01-05 DIAGNOSIS — M6281 Muscle weakness (generalized): Secondary | ICD-10-CM | POA: Diagnosis not present

## 2022-01-05 DIAGNOSIS — M546 Pain in thoracic spine: Secondary | ICD-10-CM | POA: Diagnosis not present

## 2022-01-05 DIAGNOSIS — M542 Cervicalgia: Secondary | ICD-10-CM | POA: Insufficient documentation

## 2022-01-05 DIAGNOSIS — G8929 Other chronic pain: Secondary | ICD-10-CM | POA: Insufficient documentation

## 2022-01-05 DIAGNOSIS — R2681 Unsteadiness on feet: Secondary | ICD-10-CM | POA: Diagnosis not present

## 2022-01-05 DIAGNOSIS — R2689 Other abnormalities of gait and mobility: Secondary | ICD-10-CM | POA: Insufficient documentation

## 2022-01-05 DIAGNOSIS — R293 Abnormal posture: Secondary | ICD-10-CM | POA: Insufficient documentation

## 2022-01-05 NOTE — Therapy (Signed)
[facility] [address]  [place], [place], [address] Phone: [phone]   Fax:  [phone]  Physical Therapy Treatment / Re-Evaluation / Recert  Patient Details  Name: Stephanie [name] MRN: [id number] Date of Birth: [birth year] Referring Provider (PT): Penni Homans, MD   Encounter Date: 01/05/2022   PT End of Session - 01/05/22 1358     Visit Number 14    Number of Visits 20    Date for PT Re-Evaluation 02/17/22    Authorization Type Medicare & BCBS    PT Start Time 2876    PT Stop Time 1446    PT Time Calculation (min) 48 min    Activity Tolerance Patient tolerated treatment well    Behavior During Therapy [facility] for tasks assessed/performed             Past Medical History:  Diagnosis Date   Allergy    Anemia    past hx    Arthropathy of cervical spine 11/27/2012   Right  Xray at [facility]  On 04/08/2016  AP, lateral, and lateral flexion and extension views of the cervical spine are submitted for evaluation.   No acute fracture identified in the cervical spine.   There is redemonstration of approximately 2 mm of C3 on C4 anterolisthesis in neutral which slightly increases in flexion relative to neutral and extension. Slight C5 on C6 retrolisthesis does not change i   Benign hypertension without congestive heart failure    Benign mole    right hcets mole, bleeds when dried with towel   Bilateral dry eyes    Bladder cancer ([place]) 2016/11/03   Cervical spondylosis    Cervicalgia    2 screws in place    Chronic kidney disease    kidney stones    Depression with anxiety 11/03/2016   prior to husbands death    Gross hematuria    developed after first hip replacment, led to indicental finding of bladder tumor, bleeding now resolved    History of kidney stones    1970's   Left hand weakness    due to reinjury of flxor tendon s/p tendon surgery march 12-2018   Lumbar stenosis    Malignant tumor of urinary bladder  ([place]) 07/2016   Pre-stage I   OA (osteoarthritis)    Parkinson disease [facility]) dx 2012   neurologist-  dr siddiqui at [place] [facility])     Dr Trudie Reed , [place] [facility]    Rupture of flexor tendon of hand 2020   right    Past Surgical History:  Procedure Laterality Date   BIOPSY MASS LEFT FIRST METACARPAL   06/23/2006   benign   CATARACT EXTRACTION W/ INTRAOCULAR LENS  IMPLANT, BILATERAL  1999 and 2003   CERVICAL FUSION  02/2017   c1-c2 ; reports she has 2 screws 2in long in place done at [facility] ; patient exhibits VERY LIMITED NECK ROM   COLONOSCOPY     CYSTOSCOPY W/ RETROGRADES Bilateral 09/22/2016   Procedure: CYSTOSCOPY WITH RETROGRADE PYELOGRAM;  Surgeon: Alexis Frock, MD;  Location: [facility];  Service: Urology;  Laterality: Bilateral;   D & C HYSTEROSCOPY W/ RESECTION POLYP  07/11/2000   DILATION AND CURETTAGE OF UTERUS  1960's   EXCISION CYST AND DEBRIDEMENT RIGHT WIRST AND REMOVAL FORGEIGN BODY  01/09/2009   FLEXOR TENDON REPAIR Left 12/12/2018   Procedure: REPAIR/TRANSFER FLEXOR DIGITORUM PROFUNDUS OF LEFT SMALL FINGER;  Surgeon: Daryll Brod, MD;  Location: [facility];  Service: Orthopedics;  Laterality: Left;   LAPAROSCOPY  yrs ago   infertility    METACARPOPHALANGEAL JOINT ARTHRODESIS Right 05/25/1996   TENDON REPAIR Left 01/15/2019   Hand   TONSILLECTOMY AND ADENOIDECTOMY  child   TOTAL HIP ARTHROPLASTY Left 07/07/2016   Procedure: LEFT TOTAL HIP ARTHROPLASTY ANTERIOR APPROACH;  Surgeon: Gaynelle Arabian, MD;  Location: [facility];  Service: Orthopedics;  Laterality: Left;   TOTAL HIP ARTHROPLASTY Right 09/28/2017   Procedure: RIGHT TOTAL HIP ARTHROPLASTY ANTERIOR APPROACH;  Surgeon: Gaynelle Arabian, MD;  Location: [facility];  Service: Orthopedics;  Laterality: Right;   TOTAL KNEE ARTHROPLASTY Right 07/30/2019   Procedure: TOTAL KNEE ARTHROPLASTY;  Surgeon: Gaynelle Arabian, MD;  Location: [facility];  Service: Orthopedics;   Laterality: Right;  69min   TRANSURETHRAL RESECTION OF BLADDER TUMOR N/A 09/22/2016   Procedure: TRANSURETHRAL RESECTION OF BLADDER TUMOR (TURBT);  Surgeon: Alexis Frock, MD;  [facility];  Service: Urology;  Laterality: N/A;    There were no vitals filed for this visit.   Subjective Assessment - 01/05/22 1402     Subjective Pt reports she was ill for 2 weeks since the last time she came to PT. Having been ill, she has been less active only resuming walking for exercise this week. She notes her knee pain has been better but still notes some discomfort in her neck and the back of her L shoulder which will sometimes wake her up at night.    Pertinent History R TKA 07/2019, B THR 2018 & 2017, posterior C1-2 cervical fusion 2018, rupture of flexor tendon of L hand with failed surgical repair 2020, Parkinsons, bladder cancer, HTN    Patient Stated Goals "less pain - able to dress upper body or put on a coat w/o pain"    Currently in Pain? No/denies                [facility] PT Assessment - 01/05/22 1358       Assessment   Medical Diagnosis Left shoulder pain, Neck & upper back pain, Psoriatic arthritis, Parkinson's disease    Referring Provider (PT) Penni Homans, MD    Onset Date/Surgical Date --   1+ yrs   Next MD Visit 01/26/22      AROM   Left Shoulder Flexion 138 Degrees    Left Shoulder ABduction 158 Degrees   painful   Left Shoulder Internal Rotation --   FIR to T12 - painful   Left Shoulder External Rotation --   FER to T3   Cervical Flexion 52    Cervical Extension 45    Cervical - Right Side Bend 22    Cervical - Left Side Bend 18    Cervical - Right Rotation 44    Cervical - Left Rotation 35      Strength   Right Shoulder Flexion 4+/5    Right Shoulder ABduction 4+/5    Right Shoulder Internal Rotation 4+/5    Right Shoulder External Rotation 4+/5    Left Shoulder Flexion 4+/5    Left Shoulder ABduction 4/5   pain   Left Shoulder Internal  Rotation 4+/5    Left Shoulder External Rotation 4+/5    Right Hip Flexion 4/5    Right Hip Extension 4+/5    Right Hip External Rotation  4/5    Right Hip Internal Rotation 4+/5    Right Hip ABduction 4+/5    Right Hip  ADduction 4+/5    Left Hip Flexion 4/5    Left Hip Extension 4+/5    Left Hip External Rotation 4/5    Left Hip Internal Rotation 4+/5    Left Hip ABduction 4+/5    Left Hip ADduction 4+/5    Right Knee Flexion 5/5    Right Knee Extension 5/5    Left Knee Flexion 5/5    Left Knee Extension 5/5    Right Ankle Dorsiflexion 4+/5    Left Ankle Dorsiflexion 4+/5      Ambulation/Gait   Gait velocity 3.11 ft/sec      Standardized Balance Assessment   Five times sit to stand comments  14.94 sec    10 Meter Walk 10.56 sec      Functional Gait  Assessment   Gait Level Surface Walks 20 ft in less than 7 sec but greater than 5.5 sec, uses assistive device, slower speed, mild gait deviations, or deviates 6-10 in outside of the 12 in walkway width.    Change in Gait Speed Able to smoothly change walking speed without loss of balance or gait deviation. Deviate no more than 6 in outside of the 12 in walkway width.    Gait with Horizontal Head Turns Performs head turns smoothly with slight change in gait velocity (eg, minor disruption to smooth gait path), deviates 6-10 in outside 12 in walkway width, or uses an assistive device.    Gait with Vertical Head Turns Performs task with slight change in gait velocity (eg, minor disruption to smooth gait path), deviates 6 - 10 in outside 12 in walkway width or uses assistive device    Gait and Pivot Turn Pivot turns safely within 3 sec and stops quickly with no loss of balance.    Step Over Obstacle Is able to step over 2 stacked shoe boxes taped together (9 in total height) without changing gait speed. No evidence of imbalance.    Gait with Narrow Base of Support Ambulates 7-9 steps.    Gait with Eyes Closed Walks 20 ft, slow speed,  abnormal gait pattern, evidence for imbalance, deviates 10-15 in outside 12 in walkway width. Requires more than 9 sec to ambulate 20 ft.    Ambulating Backwards Walks 20 ft, slow speed, abnormal gait pattern, evidence for imbalance, deviates 10-15 in outside 12 in walkway width.    Steps Alternating feet, must use rail.    Total Score 21    FGA comment: 19-24 = medium risk fall                                    PT Education - 01/05/22 1445     Education Details Goal progress with latest standardized testing results and discussion of ongoing PT needs    Person(s) Educated Patient    Methods Explanation    Comprehension Verbalized understanding              PT Short Term Goals - 11/17/21 1508       PT SHORT TERM GOAL #1   Title Patient will be independent with initial HEP    Status Achieved   11/12/21   Target Date 11/16/21      PT SHORT TERM GOAL #2   Title Patient will demonstrate decreased tightness/spasm in the L upper trap/ levator scapulae to decrease pain to </= 5/10 with movement and promote improved cervical and L shoulder mobility  Status Achieved   11/12/21 - Pain typically only 2-3/10 at rest and no more than 5/10 with activity   Target Date 11/16/21      PT SHORT TERM GOAL #3   Title Patient will be able to verbalize and demonstrate proper posture with sitting/standing to decrease/reduce muscle tightness/reinjury    Status Achieved   11/17/21   Target Date 11/16/21      PT SHORT TERM GOAL #4   Title Establish further STGs/LTGs as indicated based on assessment of deficits related to Parkinson's disease    Status Achieved   10/26/21              PT Long Term Goals - 01/05/22 1407       PT LONG TERM GOAL #1   Title Patient will be independent with ongoing/advanced HEP for self-management at home    Status Partially Met   01/05/22 - met for current HEP   Target Date 02/17/22      PT LONG TERM GOAL #2   Title Patient to  improve L shoudler AROM to [facility] without pain provocation to allow for increased ease of UB dressing    Status Partially Met   01/05/22 - L shoulder AROM now [facility] but pain still noted with some motion   Target Date 02/17/22      PT LONG TERM GOAL #3   Title Patient to improve cervical AROM by >/= 25% with pain decreased to </= 5/10 with movement to allow for increased participation in church services and improve safety with driving    Status Partially Met   01/05/22 - Pt reports 20% improvement in neck pain (0/10 at rest, but 2-3/10 with mobility and activity). Driving improved but she notes she still positions herself directly in front of the pulpit at church to avoid having to turn her head d/t pain.   Target Date 02/17/22      PT LONG TERM GOAL #4   Title Patient will improve FGA score to >/= 25/30 to improve gait stability and reduce risk for falls    Baseline 20/30 (10/19/21 - eval), 21/30 (01/05/22)    Status On-going    Target Date 02/17/22      PT LONG TERM GOAL #5   Title Patient will verbalize understanding of local Parkinson's disease resources.    Status Partially Met   01/05/22 - Pt provided with resources from [employer]'s support groups including inclusion in monthly emails. She reports she plans to try joining the [employer] but has not done so yet.   Target Date 02/17/22                   Plan - 01/05/22 1445     Clinical Impression Statement Stephanie [name] is returning to PT after having to cancel her remaining scheduled PT appointments in the end of January due to illness and only recently feeling well enough to venture out again. Due to her extended illness, she was not able to be as active and has noted some worsening of her neck and L shoulder pain, although she notes her knee pain has resolved. The L shoulder pain will wake her up at times. Due to the increase in her neck and L shoulder pain, her AROM for both has declined in some motions although her  B UE strength remains unchanged with pain still reported with resisted abduction. B LE strength unchanged or slightly improved as compared to most recent assessment in January. Standardized  balance testing revealing slight regression in 5x STS time and gait speed due to extended inactivity from her illness, however she did improve on her FGA score by 1 point. Despite her illness and extended absence from PT, Stephanie [name] is progressing well toward her goals, having previously met all of her STGs and now all LTGs at least partially met. Given ongoing deficits related to her L shoulder pain, neck & upper back pain, psoriatic arthritis and Parkinson's disease, Stephanie [name] from recert for continued PT with frequency reduced to 1x/wk for 6 weeks as she prepares to transition into community-based exercise for patients with Parkinsons (she plans to try out the [employer]).    Personal Factors and Comorbidities Age;Comorbidity 3+;Fitness;Past/Current Experience;Social Background;Time since onset of injury/illness/exacerbation    Comorbidities R TKA 07/2019, B THR 2018 & 2017, cervical fusion 02/23/17 (Posterior cervical approach for a C2 neurectomy on the right side, posterior fusion C1 and C2 with structural allograft and morselized allograft, transarticular screw fixation), h/o L frozen shoulder per pt report, rupture of flexor tendon of L hand with failed surgical repair 12/12/18, Parkinsons disease, bladder cancer, HTN, scoliosis, lumbar spondylolisthesis cervical spondylosis, depression with anxiety    Examination-Activity Limitations Bathing;Dressing;Hygiene/Grooming;Lift;Carry;Reach Overhead;Sit;Stand;Locomotion Level;Sleep    Examination-Participation Restrictions Church;Cleaning;Community Activity;Driving;Laundry;Meal Prep;Shop    Rehab Potential Good    PT Frequency 1x / week    PT Duration 6 weeks    PT Treatment/Interventions ADLs/Self Care Home Management;Cryotherapy;Electrical  Stimulation;Iontophoresis 38m/ml Dexamethasone;Moist Heat;Ultrasound;DME Instruction;Gait training;Stair training;Functional mobility training;Therapeutic activities;Therapeutic exercise;Balance training;Neuromuscular re-education;Patient/family education;Manual techniques;Passive range of motion;Dry needling;Taping;Vasopneumatic Device;Spinal Manipulations;Joint Manipulations    PT Next Visit Plan progress postural stretching and strengthening along with cervical and shoulder AROM; review and update HEP PRN; resume standing, balance and PWR! Moves activites    PT Home Exercise Plan Access Code: [email or web address](12/5, updated 12/12, 12/15, 1/3 & 1/23); PWR! Moves: Seated (1/5), Standing (1/19)    Consulted and Agree with Plan of Care Patient             Patient will [name] from skilled therapeutic intervention in order to improve the following deficits and impairments:  Abnormal gait, Decreased activity tolerance, Decreased balance, Decreased knowledge of use of DME, Decreased mobility, Decreased range of motion, Decreased safety awareness, Decreased strength, Difficulty walking, Increased fascial restricitons, Increased muscle spasms, Impaired perceived functional ability, Impaired flexibility, Impaired UE functional use, Improper body mechanics, Postural dysfunction, Pain  Visit Diagnosis: Chronic left shoulder pain  Stiffness of left shoulder, not elsewhere classified  Cervicalgia  Pain in thoracic spine  Abnormal posture  Muscle weakness (generalized)  Unsteadiness on feet  Other abnormalities of gait and mobility     Problem List Patient Active Problem List   Diagnosis Date Noted   Left shoulder pain 09/14/2021   COVID-19 01/06/2021   Diarrhea 09/03/2020   Vitamin D deficiency 09/03/2020   OA (osteoarthritis) of knee 07/30/2019   Tinea corporis 06/08/2019   [place]oriatic [place] 04/02/2019   Anemia 03/13/2018   Hot flashes 03/13/2018   S/P cervical spinal fusion  03/31/2017   Neck pain 12/12/2016   Depression with anxiety 10/17/2016   [place] 10/17/2016   OA (osteoarthritis) of hip 07/07/2016   Spinal stenosis of lumbar region with radiculopathy 04/08/2016   Spondylolisthesis of lumbar region 04/08/2016   Spondylosis of cervical region without myelopathy or radiculopathy 04/08/2016   Preventative health care 01/18/2016   Low back pain 01/18/2016   History of chicken pox    Hyperlipidemia 12/15/2014  Allergic rhinitis 06/25/2014   Allergy to bee sting 06/10/2014   TMJ tenderness 11/16/2013   Other malaise and fatigue 05/07/2013   Arthropathy of cervical spine ([place]) 11/27/2012   Muscle spasms of neck 11/27/2012   Parkinson's disease ([place]) 05/24/2012   Conjunctivochalasis 03/09/2012   Epiretinal membrane 03/09/2012   Hyperopia with astigmatism and presbyopia 03/09/2012   Pseudophakia 03/09/2012   HTN (hypertension) 08/31/2011   Right knee pain 08/31/2011   Benign hypertensive heart disease without heart failure 05/12/2011    Percival Spanish, PT 01/05/2022, 7:25 PM  [facility] [address]  [place], [place], [address] Phone: [phone]   Fax:  [phone]  Name: KAISA WOFFORD MRN: [id number] Date of Birth: 12/18/1931

## 2022-01-06 ENCOUNTER — Ambulatory Visit: Payer: Medicare Other | Admitting: Physical Therapy

## 2022-01-07 DIAGNOSIS — L4050 Arthropathic psoriasis, unspecified: Secondary | ICD-10-CM | POA: Diagnosis not present

## 2022-01-12 ENCOUNTER — Ambulatory Visit: Payer: Medicare Other | Admitting: Physical Therapy

## 2022-01-12 ENCOUNTER — Encounter: Payer: Self-pay | Admitting: Physical Therapy

## 2022-01-12 ENCOUNTER — Other Ambulatory Visit: Payer: Self-pay

## 2022-01-12 DIAGNOSIS — M542 Cervicalgia: Secondary | ICD-10-CM | POA: Diagnosis not present

## 2022-01-12 DIAGNOSIS — M25612 Stiffness of left shoulder, not elsewhere classified: Secondary | ICD-10-CM | POA: Diagnosis not present

## 2022-01-12 DIAGNOSIS — M25512 Pain in left shoulder: Secondary | ICD-10-CM

## 2022-01-12 DIAGNOSIS — R2681 Unsteadiness on feet: Secondary | ICD-10-CM

## 2022-01-12 DIAGNOSIS — M6281 Muscle weakness (generalized): Secondary | ICD-10-CM

## 2022-01-12 DIAGNOSIS — M546 Pain in thoracic spine: Secondary | ICD-10-CM | POA: Diagnosis not present

## 2022-01-12 DIAGNOSIS — R293 Abnormal posture: Secondary | ICD-10-CM | POA: Diagnosis not present

## 2022-01-12 DIAGNOSIS — R2689 Other abnormalities of gait and mobility: Secondary | ICD-10-CM

## 2022-01-12 DIAGNOSIS — G8929 Other chronic pain: Secondary | ICD-10-CM | POA: Diagnosis not present

## 2022-01-12 NOTE — Therapy (Signed)
[facility] [address]  [place], [place], [address] Phone: [phone]   Fax:  [phone]  Physical Therapy Treatment  Patient Details  Name: Stephanie [name] MRN: [id number] Date of Birth: 30-Aug-1932 Referring Provider (PT): Penni Homans, MD   Encounter Date: 01/12/2022   PT End of Session - 01/12/22 1528     Visit Number 15    Number of Visits 20    Date for PT Re-Evaluation 02/17/22    Authorization Type Medicare & BCBS    PT Start Time 1528    PT Stop Time 0539    PT Time Calculation (min) 46 min    Activity Tolerance Patient tolerated treatment well    Behavior During Therapy [facility] for tasks assessed/performed             Past Medical History:  Diagnosis Date   Allergy    Anemia    past hx    Arthropathy of cervical spine 11/27/2012   Right  Xray at [facility]  On 04/08/2016  AP, lateral, and lateral flexion and extension views of the cervical spine are submitted for evaluation.   No acute fracture identified in the cervical spine.   There is redemonstration of approximately 2 mm of C3 on C4 anterolisthesis in neutral which slightly increases in flexion relative to neutral and extension. Slight C5 on C6 retrolisthesis does not change i   Benign hypertension without congestive heart failure    Benign mole    right hcets mole, bleeds when dried with towel   Bilateral dry eyes    Bladder cancer ([place]) 18-Oct-2016   Cervical spondylosis    Cervicalgia    2 screws in place    Chronic kidney disease    kidney stones    Depression with anxiety 18-Oct-2016   prior to husbands death    Gross hematuria    developed after first hip replacment, led to indicental finding of bladder tumor, bleeding now resolved    History of kidney stones    1970's   Left hand weakness    due to reinjury of flxor tendon s/p tendon surgery march 12-2018   Lumbar stenosis    Malignant tumor of urinary bladder ([place]) 07/2016   Pre-stage I    OA (osteoarthritis)    Parkinson disease [facility]) dx 2012   neurologist-  dr siddiqui at [place] [facility])     Dr Trudie Reed , [place] [facility]    Rupture of flexor tendon of hand 2020   right    Past Surgical History:  Procedure Laterality Date   BIOPSY MASS LEFT FIRST METACARPAL   06/23/2006   benign   CATARACT EXTRACTION W/ INTRAOCULAR LENS  IMPLANT, BILATERAL  1999 and 2003   CERVICAL FUSION  02/2017   c1-c2 ; reports she has 2 screws 2in long in place done at [facility] ; patient exhibits VERY LIMITED NECK ROM   COLONOSCOPY     CYSTOSCOPY W/ RETROGRADES Bilateral 09/22/2016   Procedure: CYSTOSCOPY WITH RETROGRADE PYELOGRAM;  Surgeon: Alexis Frock, MD;  Location: [facility];  Service: Urology;  Laterality: Bilateral;   D & C HYSTEROSCOPY W/ RESECTION POLYP  07/11/2000   DILATION AND CURETTAGE OF UTERUS  1960's   EXCISION CYST AND DEBRIDEMENT RIGHT WIRST AND REMOVAL FORGEIGN BODY  01/09/2009   FLEXOR TENDON REPAIR Left 12/12/2018   Procedure: REPAIR/TRANSFER FLEXOR DIGITORUM PROFUNDUS OF LEFT SMALL FINGER;  Surgeon: Daryll Brod, MD;  Location: [facility];  Service: Orthopedics;  Laterality: Left;   LAPAROSCOPY  yrs ago   infertility    METACARPOPHALANGEAL JOINT ARTHRODESIS Right 05/25/1996   TENDON REPAIR Left 01/15/2019   Hand   TONSILLECTOMY AND ADENOIDECTOMY  child   TOTAL HIP ARTHROPLASTY Left 07/07/2016   Procedure: LEFT TOTAL HIP ARTHROPLASTY ANTERIOR APPROACH;  Surgeon: Gaynelle Arabian, MD;  Location: [facility];  Service: Orthopedics;  Laterality: Left;   TOTAL HIP ARTHROPLASTY Right 09/28/2017   Procedure: RIGHT TOTAL HIP ARTHROPLASTY ANTERIOR APPROACH;  Surgeon: Gaynelle Arabian, MD;  Location: [facility];  Service: Orthopedics;  Laterality: Right;   TOTAL KNEE ARTHROPLASTY Right 07/30/2019   Procedure: TOTAL KNEE ARTHROPLASTY;  Surgeon: Gaynelle Arabian, MD;  Location: [facility];  Service: Orthopedics;  Laterality: Right;  21mn    TRANSURETHRAL RESECTION OF BLADDER TUMOR N/A 09/22/2016   Procedure: TRANSURETHRAL RESECTION OF BLADDER TUMOR (TURBT);  Surgeon: TAlexis Frock MD;  [facility]  Service: Urology;  Laterality: N/A;    There were no vitals filed for this visit.   Subjective Assessment - 01/12/22 1533     Subjective Pt reports she had a rough night last night due to her back - thinks it may have been due to being in the dental chair for 2 hours yesterday with her back hurting for the 2nd hour while th dentist drilled.    Pertinent History R TKA 07/2019, B THR 2018 & 2017, posterior C1-2 cervical fusion 2018, rupture of flexor tendon of L hand with failed surgical repair 2020, Parkinsons, bladder cancer, HTN    Patient Stated Goals "less pain - able to dress upper body or put on a coat w/o pain"    Currently in Pain? Yes    Pain Score 4    "3 or 4"/10   Pain Orientation Left    Pain Descriptors / Indicators Sore    Pain Type Acute pain;Chronic pain    Pain Frequency Intermittent    Multiple Pain Sites No    Pain Score 0    Pain Location Back                               [facility] Adult PT Treatment/Exercise - 01/12/22 1528       Neck Exercises: Stretches   Other Neck Stretches B rhomboid & LS stretch with arms extended and hands clasped, rounding shoulders forward 2 x 30 sec   pt instructed to raise or lower her arms to achieve the stretch at the desired level     Knee/Hip Exercises: Aerobic   Nustep L6 x 6 min (UE/LE to promote reciprocal motion)      Shoulder Exercises: Prone   Extension Left;10 reps;Strengthening;Weights    Extension Weight (lbs) 1    Extension Limitations "I" - seated leaning forward resting R elbow in lap    Horizontal ABduction 1 Limitations "T" attempted but deferred d/t increased pain in lateral shoulder - seated leaning forward resting R elbow in lap      Shoulder Exercises: Standing   External Rotation Both;10  reps;Strengthening;Theraband    Theraband Level (Shoulder External Rotation) Level 2 (Red)   anchored in door   External Rotation Limitations cues for scap retraction      Shoulder Exercises: Isometric Strengthening   External Rotation 5X5"    External Rotation Limitations isometric/reactive step-out    Internal Rotation 5X5"    Internal Rotation Limitations isometric/reactive step-out  Manual Therapy   Soft tissue mobilization STM/DTM to L UT, LS and medial scapular muscles    Myofascial Release manual TPR to L LS                       PT Short Term Goals - 11/17/21 1508       PT SHORT TERM GOAL #1   Title Patient will be independent with initial HEP    Status Achieved   11/12/21   Target Date 11/16/21      PT SHORT TERM GOAL #2   Title Patient will demonstrate decreased tightness/spasm in the L upper trap/ levator scapulae to decrease pain to </= 5/10 with movement and promote improved cervical and L shoulder mobility    Status Achieved   11/12/21 - Pain typically only 2-3/10 at rest and no more than 5/10 with activity   Target Date 11/16/21      PT SHORT TERM GOAL #3   Title Patient will be able to verbalize and demonstrate proper posture with sitting/standing to decrease/reduce muscle tightness/reinjury    Status Achieved   11/17/21   Target Date 11/16/21      PT SHORT TERM GOAL #4   Title Establish further STGs/LTGs as indicated based on assessment of deficits related to Parkinson's disease    Status Achieved   10/26/21              PT Long Term Goals - 01/05/22 1407       PT LONG TERM GOAL #1   Title Patient will be independent with ongoing/advanced HEP for self-management at home    Status Partially Met   01/05/22 - met for current HEP   Target Date 02/17/22      PT LONG TERM GOAL #2   Title Patient to improve L shoudler AROM to [facility] without pain provocation to allow for increased ease of UB dressing    Status Partially Met   01/05/22 - L  shoulder AROM now [facility] but pain still noted with some motion   Target Date 02/17/22      PT LONG TERM GOAL #3   Title Patient to improve cervical AROM by >/= 25% with pain decreased to </= 5/10 with movement to allow for increased participation in church services and improve safety with driving    Status Partially Met   01/05/22 - Pt reports 20% improvement in neck pain (0/10 at rest, but 2-3/10 with mobility and activity). Driving improved but she notes she still positions herself directly in front of the pulpit at church to avoid having to turn her head d/t pain.   Target Date 02/17/22      PT LONG TERM GOAL #4   Title Patient will improve FGA score to >/= 25/30 to improve gait stability and reduce risk for falls    Baseline 20/30 (10/19/21 - eval), 21/30 (01/05/22)    Status On-going    Target Date 02/17/22      PT LONG TERM GOAL #5   Title Patient will verbalize understanding of local Parkinson's disease resources.    Status Partially Met   01/05/22 - Pt provided with resources from [employer]'s support groups including inclusion in monthly emails. She reports she plans to try joining the [employer] but has not done so yet.   Target Date 02/17/22                   Plan - 01/12/22 1614  Clinical Impression Statement Stephanie [name] reports she feels like she does better for the day if she tries to walk for exercise in the morning. She notes increased back and L shoulder pain following an extended dental visit yesterday - back pain seems to be resolving but still bothered by L shoulder pain with tight band/TP identified in her LS. Able to reduce muscle tension in LS with manual STM/TPR but pain still noted in lateral shoulder with attempts to review most recent HEP update - pt cautioned to hold off on exercises that increase her pain for now and will continue to work to normalize muscle tension and strengthen posterior shoulder/scapular muscles.    Comorbidities R TKA  07/2019, B THR 2018 & 2017, cervical fusion 02/23/17 (Posterior cervical approach for a C2 neurectomy on the right side, posterior fusion C1 and C2 with structural allograft and morselized allograft, transarticular screw fixation), h/o L frozen shoulder per pt report, rupture of flexor tendon of L hand with failed surgical repair 12/12/18, Parkinsons disease, bladder cancer, HTN, scoliosis, lumbar spondylolisthesis cervical spondylosis, depression with anxiety    Rehab Potential Good    PT Frequency 1x / week    PT Duration 6 weeks    PT Treatment/Interventions ADLs/Self Care Home Management;Cryotherapy;Electrical Stimulation;Iontophoresis 15m/ml Dexamethasone;Moist Heat;Ultrasound;DME Instruction;Gait training;Stair training;Functional mobility training;Therapeutic activities;Therapeutic exercise;Balance training;Neuromuscular re-education;Patient/family education;Manual techniques;Passive range of motion;Dry needling;Taping;Vasopneumatic Device;Spinal Manipulations;Joint Manipulations    PT Next Visit Plan progress postural stretching and strengthening along with cervical and shoulder AROM; review and update HEP PRN; resume standing, balance and PWR! Moves activites    PT Home Exercise Plan Access Code: [email or web address](12/5, updated 12/12, 12/15, 1/3, 1/23 & 2/28); PWR! Moves: Seated (1/5), Standing (1/19)    Consulted and Agree with Plan of Care Patient             Patient will benefit from skilled therapeutic intervention in order to improve the following deficits and impairments:  Abnormal gait, Decreased activity tolerance, Decreased balance, Decreased knowledge of use of DME, Decreased mobility, Decreased range of motion, Decreased safety awareness, Decreased strength, Difficulty walking, Increased fascial restricitons, Increased muscle spasms, Impaired perceived functional ability, Impaired flexibility, Impaired UE functional use, Improper body mechanics, Postural dysfunction, Pain  Visit  Diagnosis: Chronic left shoulder pain  Stiffness of left shoulder, not elsewhere classified  Cervicalgia  Pain in thoracic spine  Abnormal posture  Muscle weakness (generalized)  Unsteadiness on feet  Other abnormalities of gait and mobility     Problem List Patient Active Problem List   Diagnosis Date Noted   Left shoulder pain 09/14/2021   COVID-19 01/06/2021   Diarrhea 09/03/2020   Vitamin D deficiency 09/03/2020   OA (osteoarthritis) of knee 07/30/2019   Tinea corporis 06/08/2019   Psoriatic arthritis ([place] 04/02/2019   Anemia 03/13/2018   Hot flashes 03/13/2018   S/P cervical spinal fusion 03/31/2017   Neck pain 12/12/2016   Depression with anxiety 10/17/2016   Bladder cancer ([place] 10/17/2016   OA (osteoarthritis) of hip 07/07/2016   Spinal stenosis of lumbar region with radiculopathy 04/08/2016   Spondylolisthesis of lumbar region 04/08/2016   Spondylosis of cervical region without myelopathy or radiculopathy 04/08/2016   Preventative health care 01/18/2016   Low back pain 01/18/2016   History of chicken pox    Hyperlipidemia 12/15/2014   Allergic rhinitis 06/25/2014   Allergy to bee sting 06/10/2014   TMJ tenderness 11/16/2013   Other malaise and fatigue 05/07/2013   Arthropathy of cervical spine (HCC) 11/27/2012   Muscle spasms  of neck 11/27/2012   Parkinson's disease ([place]) 05/24/2012   Conjunctivochalasis 03/09/2012   Epiretinal membrane 03/09/2012   Hyperopia with astigmatism and presbyopia 03/09/2012   Pseudophakia 03/09/2012   HTN (hypertension) 08/31/2011   Right knee pain 08/31/2011   Benign hypertensive heart disease without heart failure 05/12/2011    Percival Spanish, PT 01/12/2022, 6:19 PM  [facility] [address]  [place], [place], [address] Phone: [phone]   Fax:  [phone]  Name: Stephanie [name] MRN: [id number] Date of Birth: 05-Oct-1932

## 2022-01-12 NOTE — Patient Instructions (Signed)
° ° °  Access Code: [email or web address] URL: [email or web address] Date: 01/12/2022 Prepared by: Annie Paras  Exercises Shoulder Rolls in Sitting - 2-3 x daily - 7 x weekly - 2 sets - 10 reps Seated Scapular Retraction - 2-3 x daily - 7 x weekly - 2 sets - 10 reps - 5 sec hold Doorway Pec Stretch at 60 Degrees Abduction with Arm Straight - 2-3 x daily - 7 x weekly - 3 reps - 30 sec hold Standing Bilateral Low Shoulder Row with Anchored Resistance - 1 x daily - 7 x weekly - 2 sets - 10 reps - 5 sec hold Scapular Retraction with Resistance Advanced - 1 x daily - 7 x weekly - 2 sets - 10 reps - 5 sec hold Seated Gentle Upper Trapezius Stretch - 2-3 x daily - 7 x weekly - 3 reps - 30 sec hold Gentle Levator Scapulae Stretch - 2-3 x daily - 7 x weekly - 3 reps - 30 sec hold Seated Rhomboid Stretch - 2-3 x daily - 7 x weekly - 3 reps - 30 sec hold Seated Hamstring Stretch - 2-3 x daily - 7 x weekly - 3 reps - 30 sec hold Seated Hip Flexor Stretch - 2-3 x daily - 7 x weekly - 3 reps - 30 sec hold Seated Isometric Hip Abduction with Resistance - 1 x daily - 7 x weekly - 2 sets - 10 reps - 3 sec hold Seated March with Resistance - 1 x daily - 7 x weekly - 2 sets - 10 reps - 3 sec hold Sit to Stand with Resistance Around Legs - 1 x daily - 7 x weekly - 2 sets - 5 reps Standing Lower Cervical and Upper Thoracic Stretch - 1 x daily - 7 x weekly - 3 reps - 30 sec hold Prone Single Arm Shoulder Horizontal Abduction with Scapular Retraction and Palm Down - 1 x daily - 7 x weekly - 2 sets - 10 reps - 3 sec hold Prone Shoulder Extension - Single Arm with Dumbbell - 1 x daily - 7 x weekly - 2 sets - 10 reps - 3 sec hold Seated Shoulder Diagonal with Resistance (Mirrored) - 1 x daily - 7 x weekly - 2 sets - 10 reps - 3 sec hold Shoulder External Rotation and Scapular Retraction with Resistance - 1 x daily - 3-4 x weekly - 2 sets - 10 reps - 3-5 sec hold  Patient Education Biomedical scientist

## 2022-01-13 DIAGNOSIS — Z886 Allergy status to analgesic agent status: Secondary | ICD-10-CM | POA: Diagnosis not present

## 2022-01-13 DIAGNOSIS — Z885 Allergy status to narcotic agent status: Secondary | ICD-10-CM | POA: Diagnosis not present

## 2022-01-13 DIAGNOSIS — Z79899 Other long term (current) drug therapy: Secondary | ICD-10-CM | POA: Diagnosis not present

## 2022-01-13 DIAGNOSIS — Z880 Allergy status to penicillin: Secondary | ICD-10-CM | POA: Diagnosis not present

## 2022-01-13 DIAGNOSIS — Z881 Allergy status to other antibiotic agents status: Secondary | ICD-10-CM | POA: Diagnosis not present

## 2022-01-13 DIAGNOSIS — G20 Parkinson's disease: Secondary | ICD-10-CM | POA: Diagnosis not present

## 2022-01-15 ENCOUNTER — Ambulatory Visit: Payer: Medicare Other | Admitting: Physical Therapy

## 2022-01-20 ENCOUNTER — Encounter: Payer: Self-pay | Admitting: Physical Therapy

## 2022-01-20 ENCOUNTER — Ambulatory Visit: Payer: Medicare Other | Attending: Family Medicine | Admitting: Physical Therapy

## 2022-01-20 ENCOUNTER — Other Ambulatory Visit: Payer: Self-pay

## 2022-01-20 DIAGNOSIS — M546 Pain in thoracic spine: Secondary | ICD-10-CM

## 2022-01-20 DIAGNOSIS — R2681 Unsteadiness on feet: Secondary | ICD-10-CM

## 2022-01-20 DIAGNOSIS — M542 Cervicalgia: Secondary | ICD-10-CM | POA: Diagnosis not present

## 2022-01-20 DIAGNOSIS — R2689 Other abnormalities of gait and mobility: Secondary | ICD-10-CM

## 2022-01-20 DIAGNOSIS — M25512 Pain in left shoulder: Secondary | ICD-10-CM | POA: Diagnosis not present

## 2022-01-20 DIAGNOSIS — G8929 Other chronic pain: Secondary | ICD-10-CM | POA: Diagnosis not present

## 2022-01-20 DIAGNOSIS — M6281 Muscle weakness (generalized): Secondary | ICD-10-CM

## 2022-01-20 DIAGNOSIS — R293 Abnormal posture: Secondary | ICD-10-CM

## 2022-01-20 DIAGNOSIS — M25612 Stiffness of left shoulder, not elsewhere classified: Secondary | ICD-10-CM

## 2022-01-20 NOTE — Patient Instructions (Signed)
? ?  Access Code: [email or web address] ?URL: [email or web address] ?Date: 01/20/2022 ?Prepared by: Annie Paras ? ?Exercises ?Shoulder Rolls in Sitting - 2-3 x daily - 7 x weekly - 2 sets - 10 reps ?Seated Scapular Retraction - 2-3 x daily - 7 x weekly - 2 sets - 10 reps - 5 sec hold ?Doorway Pec Stretch at 60 Degrees Abduction with Arm Straight - 2-3 x daily - 7 x weekly - 3 reps - 30 sec hold ?Standing Bilateral Low Shoulder Row with Anchored Resistance - 1 x daily - 7 x weekly - 2 sets - 10 reps - 5 sec hold ?Scapular Retraction with Resistance Advanced - 1 x daily - 7 x weekly - 2 sets - 10 reps - 5 sec hold ?Seated Gentle Upper Trapezius Stretch - 2-3 x daily - 7 x weekly - 3 reps - 30 sec hold ?Gentle Levator Scapulae Stretch - 2-3 x daily - 7 x weekly - 3 reps - 30 sec hold ?Seated Rhomboid Stretch - 2-3 x daily - 7 x weekly - 3 reps - 30 sec hold ?Seated Hamstring Stretch - 2-3 x daily - 7 x weekly - 3 reps - 30 sec hold ?Seated Hip Flexor Stretch - 2-3 x daily - 7 x weekly - 3 reps - 30 sec hold ?Seated Isometric Hip Abduction with Resistance - 1 x daily - 7 x weekly - 2 sets - 10 reps - 3 sec hold ?Seated March with Resistance - 1 x daily - 7 x weekly - 2 sets - 10 reps - 3 sec hold ?Sit to Stand with Resistance Around Legs - 1 x daily - 7 x weekly - 2 sets - 5 reps ?Standing Lower Cervical and Upper Thoracic Stretch - 1 x daily - 7 x weekly - 3 reps - 30 sec hold ?Prone Single Arm Shoulder Horizontal Abduction with Scapular Retraction and Palm Down - 1 x daily - 7 x weekly - 2 sets - 10 reps - 3 sec hold ?Prone Shoulder Extension - Single Arm with Dumbbell - 1 x daily - 7 x weekly - 2 sets - 10 reps - 3 sec hold ?Seated Shoulder Diagonal with Resistance (Mirrored) - 1 x daily - 7 x weekly - 2 sets - 10 reps - 3 sec hold ?Shoulder External Rotation and Scapular Retraction with Resistance - 1 x daily - 3-4 x weekly - 2 sets - 10 reps - 3-5 sec hold ?Seated Flexion Stretch with Swiss Ball - 2 x daily - 7 x  weekly - 3 reps - 30 sec hold ?Seated Thoracic Flexion and Rotation with Swiss Ball - 2 x daily - 7 x weekly - 3 reps - 30 sec hold ? ?Patient Education ?Posture and Body Mechanics ? ?

## 2022-01-20 NOTE — Therapy (Signed)
[facility] [address]  [place], [place], [address] Phone: [phone]   Fax:  [phone]  Physical Therapy Treatment  Patient Details  Name: Stephanie [name] MRN: [id number] Date of Birth: 08-Nov-1932 Referring Provider (PT): Penni Homans, MD   Encounter Date: 01/20/2022   PT End of Session - 01/20/22 0847     Visit Number 16    Number of Visits 20    Date for PT Re-Evaluation 02/17/22    Authorization Type Medicare & BCBS    PT Start Time [phone]    PT Stop Time 0932    PT Time Calculation (min) 45 min    Activity Tolerance Patient tolerated treatment well    Behavior During Therapy [facility] for tasks assessed/performed             Past Medical History:  Diagnosis Date   Allergy    Anemia    past hx    Arthropathy of cervical spine 11/27/2012   Right  Xray at [facility]  On 04/08/2016  AP, lateral, and lateral flexion and extension views of the cervical spine are submitted for evaluation.   No acute fracture identified in the cervical spine.   There is redemonstration of approximately 2 mm of C3 on C4 anterolisthesis in neutral which slightly increases in flexion relative to neutral and extension. Slight C5 on C6 retrolisthesis does not change i   Benign hypertension without congestive heart failure    Benign mole    right hcets mole, bleeds when dried with towel   Bilateral dry eyes    Bladder cancer ([place]) 11-11-2016   Cervical spondylosis    Cervicalgia    2 screws in place    Chronic kidney disease    kidney stones    Depression with anxiety November 11, 2016   prior to husbands death    Gross hematuria    developed after first hip replacment, led to indicental finding of bladder tumor, bleeding now resolved    History of kidney stones    1970's   Left hand weakness    due to reinjury of flxor tendon s/p tendon surgery march 12-2018   Lumbar stenosis    Malignant tumor of urinary bladder ([place]) 07/2016   Pre-stage I    OA (osteoarthritis)    Parkinson disease [facility]) dx 2012   neurologist-  dr siddiqui at [place] [facility])     Dr Trudie Reed , [place] [facility]    Rupture of flexor tendon of hand 2020   right    Past Surgical History:  Procedure Laterality Date   BIOPSY MASS LEFT FIRST METACARPAL   06/23/2006   benign   CATARACT EXTRACTION W/ INTRAOCULAR LENS  IMPLANT, BILATERAL  1999 and 2003   CERVICAL FUSION  02/2017   c1-c2 ; reports she has 2 screws 2in long in place done at [facility] ; patient exhibits VERY LIMITED NECK ROM   COLONOSCOPY     CYSTOSCOPY W/ RETROGRADES Bilateral 09/22/2016   Procedure: CYSTOSCOPY WITH RETROGRADE PYELOGRAM;  Surgeon: Alexis Frock, MD;  Location: [facility];  Service: Urology;  Laterality: Bilateral;   D & C HYSTEROSCOPY W/ RESECTION POLYP  07/11/2000   DILATION AND CURETTAGE OF UTERUS  1960's   EXCISION CYST AND DEBRIDEMENT RIGHT WIRST AND REMOVAL FORGEIGN BODY  01/09/2009   FLEXOR TENDON REPAIR Left 12/12/2018   Procedure: REPAIR/TRANSFER FLEXOR DIGITORUM PROFUNDUS OF LEFT SMALL FINGER;  Surgeon: Daryll Brod, MD;  Location: [place];  Service: Orthopedics;  Laterality: Left;   LAPAROSCOPY  yrs ago   infertility    METACARPOPHALANGEAL JOINT ARTHRODESIS Right 05/25/1996   TENDON REPAIR Left 01/15/2019   Hand   TONSILLECTOMY AND ADENOIDECTOMY  child   TOTAL HIP ARTHROPLASTY Left 07/07/2016   Procedure: LEFT TOTAL HIP ARTHROPLASTY ANTERIOR APPROACH;  Surgeon: Gaynelle Arabian, MD;  Location: [facility];  Service: Orthopedics;  Laterality: Left;   TOTAL HIP ARTHROPLASTY Right 09/28/2017   Procedure: RIGHT TOTAL HIP ARTHROPLASTY ANTERIOR APPROACH;  Surgeon: Gaynelle Arabian, MD;  Location: [facility];  Service: Orthopedics;  Laterality: Right;   TOTAL KNEE ARTHROPLASTY Right 07/30/2019   Procedure: TOTAL KNEE ARTHROPLASTY;  Surgeon: Gaynelle Arabian, MD;  Location: [facility];  Service: Orthopedics;  Laterality: Right;  30mn    TRANSURETHRAL RESECTION OF BLADDER TUMOR N/A 09/22/2016   Procedure: TRANSURETHRAL RESECTION OF BLADDER TUMOR (TURBT);  Surgeon: TAlexis Frock MD;  [facility]  Service: Urology;  Laterality: N/A;    There were no vitals filed for this visit.   Subjective Assessment - 01/20/22 0851     Subjective Pt reports her back has been bothering her more since having to clear downed limbs from her driveway after a windy day.    Pertinent History R TKA 07/2019, B THR 2018 & 2017, posterior C1-2 cervical fusion 2018, rupture of flexor tendon of L hand with failed surgical repair 2020, Parkinsons, bladder cancer, HTN    Patient Stated Goals "less pain - able to dress upper body or put on a coat w/o pain"    Currently in Pain? No/denies    Pain Score 0-No pain   4/10 upon waking in the morning   Pain Location Back    Pain Orientation Lower    Pain Descriptors / Indicators Aching                               [facility] Adult PT Treatment/Exercise - 01/20/22 0847       Knee/Hip Exercises: Stretches   Passive Hamstring Stretch Right;Left;1 rep;30 seconds    Passive Hamstring Stretch Limitations seated hip hinge      Knee/Hip Exercises: Aerobic   Nustep L7 x 6 min (UE/LE to promote reciprocal motion)      Shoulder Exercises: Standing   External Rotation Both;10 reps;Strengthening;Theraband    Theraband Level (Shoulder External Rotation) Level 2 (Red)   anchored in door   External Rotation Limitations cues for scap retraction    Extension Both;10 reps;Strengthening;Theraband    Theraband Level (Shoulder Extension) Level 2 (Red)    Extension Limitations cues for scap retraction & depression   handles attached to band for ease of grip   Row Both;10 reps;Strengthening;Theraband    Theraband Level (Shoulder Row) Level 2 (Red)    Row Limitations cues to avoid excessive shoulder extension or upward rotation of elbows   handles attached to band for ease of grip      Manual Therapy   Manual Therapy Soft tissue mobilization;Myofascial release    Soft tissue mobilization STM/DTM to L UT, LS, scalenes and cervical paraspinals    Myofascial Release manual TPR and pin & stretch to L UT and LS                     PT Education - 01/20/22 0930     Education Details HEP update - seated 3-way child's pose with SPC  Person(s) Educated Patient    Methods Explanation;Demonstration;Verbal cues;Handout    Comprehension Verbalized understanding;Verbal cues required;Returned demonstration              PT Short Term Goals - 11/17/21 1508       PT SHORT TERM GOAL #1   Title Patient will be independent with initial HEP    Status Achieved   11/12/21   Target Date 11/16/21      PT SHORT TERM GOAL #2   Title Patient will demonstrate decreased tightness/spasm in the L upper trap/ levator scapulae to decrease pain to </= 5/10 with movement and promote improved cervical and L shoulder mobility    Status Achieved   11/12/21 - Pain typically only 2-3/10 at rest and no more than 5/10 with activity   Target Date 11/16/21      PT SHORT TERM GOAL #3   Title Patient will be able to verbalize and demonstrate proper posture with sitting/standing to decrease/reduce muscle tightness/reinjury    Status Achieved   11/17/21   Target Date 11/16/21      PT SHORT TERM GOAL #4   Title Establish further STGs/LTGs as indicated based on assessment of deficits related to Parkinson's disease    Status Achieved   10/26/21              PT Long Term Goals - 01/20/22 0930       PT LONG TERM GOAL #1   Title Patient will be independent with ongoing/advanced HEP for self-management at home    Status Partially Met   01/05/22 - met for current HEP   Target Date 02/17/22      PT LONG TERM GOAL #2   Title Patient to improve L shoudler AROM to [facility] without pain provocation to allow for increased ease of UB dressing    Status Partially Met   01/05/22 - L shoulder AROM  now [employer] but pain still noted with some motion   Target Date 02/17/22      PT LONG TERM GOAL #3   Title Patient to improve cervical AROM by >/= 25% with pain decreased to </= 5/10 with movement to allow for increased participation in church services and improve safety with driving    Status Partially Met   01/05/22 - Pt reports 20% improvement in neck pain (0/10 at rest, but 2-3/10 with mobility and activity). Driving improved but she notes she still positions herself directly in front of the pulpit at church to avoid having to turn her head d/t pain.   Target Date 02/17/22      PT LONG TERM GOAL #4   Title Patient will improve FGA score to >/= 25/30 to improve gait stability and reduce risk for falls    Baseline 20/30 (10/19/21 - eval), 21/30 (01/05/22)    Status On-going    Target Date 02/17/22      PT LONG TERM GOAL #5   Title Patient will verbalize understanding of local Parkinson's disease resources.    Status Partially Met   01/05/22 - Pt provided with resources from [employer] Parkison's support groups including inclusion in monthly emails. She reports she plans to try joining the [employer] but has not done so yet.   Target Date 02/17/22                   Plan - 01/20/22 0932     Clinical Impression Statement Stephanie [name] reports some increased LBP since having to clear downed  tree limbs from her driveway after recent windy days - typically worst upon waking in the morning. She reports some relief from scap retraction type exercises but not sure what else might help her back. Reviewed her HEP, highlighting exercises and stretches which would be beneficial in improving her lumbopelvic flexibility as well as improve her core and proximal UE/LE strength for improved lumbar support. Seated 3-way childs pose/prayer stretch with can for upper body support added with pt noting beneficial stretch not only for low back but also for L shoulder. Session concluded with MT including  STM and MFR to address ongoing muscle soreness/tightness in L side of neck with pt noting good relief.    Comorbidities R TKA 07/2019, B THR 2018 & 2017, cervical fusion 02/23/17 (Posterior cervical approach for a C2 neurectomy on the right side, posterior fusion C1 and C2 with structural allograft and morselized allograft, transarticular screw fixation), h/o L frozen shoulder per pt report, rupture of flexor tendon of L hand with failed surgical repair 12/12/18, Parkinsons disease, bladder cancer, HTN, scoliosis, lumbar spondylolisthesis cervical spondylosis, depression with anxiety    Rehab Potential Good    PT Frequency 1x / week    PT Duration 6 weeks    PT Treatment/Interventions ADLs/Self Care Home Management;Cryotherapy;Electrical Stimulation;Iontophoresis 61m/ml Dexamethasone;Moist Heat;Ultrasound;DME Instruction;Gait training;Stair training;Functional mobility training;Therapeutic activities;Therapeutic exercise;Balance training;Neuromuscular re-education;Patient/family education;Manual techniques;Passive range of motion;Dry needling;Taping;Vasopneumatic Device;Spinal Manipulations;Joint Manipulations    PT Next Visit Plan progress postural stretching and strengthening along with cervical and shoulder AROM; review and update HEP PRN; standing, balance and PWR! Moves activities    PT Home Exercise Plan Access Code: [email or web address](12/5, updated 12/12, 12/15, 1/3, 1/23, 2/28 & 3/8); PWR! Moves: Seated (1/5), Standing (1/19)    Consulted and Agree with Plan of Care Patient             Patient will benefit from skilled therapeutic intervention in order to improve the following deficits and impairments:  Abnormal gait, Decreased activity tolerance, Decreased balance, Decreased knowledge of use of DME, Decreased mobility, Decreased range of motion, Decreased safety awareness, Decreased strength, Difficulty walking, Increased fascial restricitons, Increased muscle spasms, Impaired perceived functional  ability, Impaired flexibility, Impaired UE functional use, Improper body mechanics, Postural dysfunction, Pain  Visit Diagnosis: Chronic left shoulder pain  Stiffness of left shoulder, not elsewhere classified  Cervicalgia  Pain in thoracic spine  Abnormal posture  Muscle weakness (generalized)  Unsteadiness on feet  Other abnormalities of gait and mobility     Problem List Patient Active Problem List   Diagnosis Date Noted   Left shoulder pain 09/14/2021   COVID-19 01/06/2021   Diarrhea 09/03/2020   Vitamin D deficiency 09/03/2020   OA (osteoarthritis) of knee 07/30/2019   Tinea corporis 06/08/2019   [address] ([address] 04/02/2019   Anemia 03/13/2018   Hot flashes 03/13/2018   S/P cervical spinal fusion 03/31/2017   Neck pain 12/12/2016   Depression with anxiety 10/17/2016   [place] 10/17/2016   OA (osteoarthritis) of hip 07/07/2016   Spinal stenosis of lumbar region with radiculopathy 04/08/2016   Spondylolisthesis of lumbar region 04/08/2016   Spondylosis of cervical region without myelopathy or radiculopathy 04/08/2016   Preventative health care 01/18/2016   Low back pain 01/18/2016   History of chicken pox    Hyperlipidemia 12/15/2014   Allergic rhinitis 06/25/2014   Allergy to bee sting 06/10/2014   TMJ tenderness 11/16/2013   Other malaise and fatigue 05/07/2013   [place] of [place] 11/27/2012  Muscle spasms of neck 11/27/2012   Parkinson's disease ([place]) 05/24/2012   Conjunctivochalasis 03/09/2012   Epiretinal membrane 03/09/2012   Hyperopia with astigmatism and presbyopia 03/09/2012   Pseudophakia 03/09/2012   HTN (hypertension) 08/31/2011   Right knee pain 08/31/2011   Benign hypertensive heart disease without heart failure 05/12/2011    Percival Spanish, PT 01/20/2022, 1:40 PM  [facility] [address]  [place], [place], [address] Phone:  [phone]   Fax:  [phone]  Name: Stephanie [name] MRN: [id number] Date of Birth: May 09, 1932

## 2022-01-26 ENCOUNTER — Encounter: Payer: Medicare Other | Admitting: Physical Therapy

## 2022-01-26 ENCOUNTER — Ambulatory Visit (INDEPENDENT_AMBULATORY_CARE_PROVIDER_SITE_OTHER): Payer: Medicare Other | Admitting: Family Medicine

## 2022-01-26 ENCOUNTER — Encounter: Payer: Self-pay | Admitting: Family Medicine

## 2022-01-26 VITALS — BP 130/78 | HR 80 | Resp 20 | Ht 62.0 in | Wt 119.6 lb

## 2022-01-26 DIAGNOSIS — E7800 Pure hypercholesterolemia, unspecified: Secondary | ICD-10-CM | POA: Diagnosis not present

## 2022-01-26 DIAGNOSIS — L4050 Arthropathic psoriasis, unspecified: Secondary | ICD-10-CM | POA: Diagnosis not present

## 2022-01-26 DIAGNOSIS — Z961 Presence of intraocular lens: Secondary | ICD-10-CM | POA: Diagnosis not present

## 2022-01-26 DIAGNOSIS — G20 Parkinson's disease: Secondary | ICD-10-CM

## 2022-01-26 DIAGNOSIS — I10 Essential (primary) hypertension: Secondary | ICD-10-CM | POA: Diagnosis not present

## 2022-01-26 DIAGNOSIS — F418 Other specified anxiety disorders: Secondary | ICD-10-CM | POA: Diagnosis not present

## 2022-01-26 DIAGNOSIS — E559 Vitamin D deficiency, unspecified: Secondary | ICD-10-CM

## 2022-01-26 DIAGNOSIS — H35371 Puckering of macula, right eye: Secondary | ICD-10-CM | POA: Diagnosis not present

## 2022-01-26 DIAGNOSIS — H16223 Keratoconjunctivitis sicca, not specified as Sjogren's, bilateral: Secondary | ICD-10-CM | POA: Diagnosis not present

## 2022-01-26 DIAGNOSIS — H26492 Other secondary cataract, left eye: Secondary | ICD-10-CM | POA: Diagnosis not present

## 2022-01-26 DIAGNOSIS — M25512 Pain in left shoulder: Secondary | ICD-10-CM

## 2022-01-26 DIAGNOSIS — G20A1 Parkinson's disease without dyskinesia, without mention of fluctuations: Secondary | ICD-10-CM

## 2022-01-26 NOTE — Patient Instructions (Signed)
Shoulder Pain °Many things can cause shoulder pain, including: °An injury to the shoulder. °Overuse of the shoulder. °Arthritis. °The source of the pain can be: °Inflammation. °An injury to the shoulder joint. °An injury to a tendon, ligament, or bone. °Follow these instructions at home: °Pay attention to changes in your symptoms. Let your health care provider know about them. Follow these instructions to relieve your pain. °If you have a sling: °Wear the sling as told by your health care provider. Remove it only as told by your health care provider. °Loosen the sling if your fingers tingle, become numb, or turn cold and blue. °Keep the sling clean. °If the sling is not waterproof: °Do not let it get wet. Remove it to shower or bathe. °Move your arm as little as possible, but keep your hand moving to prevent swelling. °Managing pain, stiffness, and swelling ° °If directed, put ice on the painful area: °Put ice in a plastic bag. °Place a towel between your skin and the bag. °Leave the ice on for 20 minutes, 2-3 times per day. Stop applying ice if it does not help with the pain. °Squeeze a soft ball or a foam pad as much as possible. This helps to keep the shoulder from swelling. It also helps to strengthen the arm. °General instructions °Take over-the-counter and prescription medicines only as told by your health care provider. °Keep all follow-up visits as told by your health care provider. This is important. °Contact a health care provider if: °Your pain gets worse. °Your pain is not relieved with medicines. °New pain develops in your arm, hand, or fingers. °Get help right away if: °Your arm, hand, or fingers: °Tingle. °Become numb. °Become swollen. °Become painful. °Turn white or blue. °Summary °Shoulder pain can be caused by an injury, overuse, or arthritis. °Pay attention to changes in your symptoms. Let your health care provider know about them. °This condition may be treated with a sling, ice, and pain  medicines. °Contact your health care provider if the pain gets worse or new pain develops. Get help right away if your arm, hand, or fingers tingle or become numb, swollen, or painful. °Keep all follow-up visits as told by your health care provider. This is important. °This information is not intended to replace advice given to you by your health care provider. Make sure you discuss any questions you have with your health care provider. °Document Revised: 05/16/2018 Document Reviewed: 05/16/2018 °Elsevier Patient Education © 2022 [employer] ° °

## 2022-01-26 NOTE — Progress Notes (Signed)
Subjective:    Patient ID: Stephanie [name], female    DOB: 05/06/1932, 88 y.o.   MRN: [id number]  Chief Complaint  Patient presents with   Follow-up   Back Pain    Going to PT for left shoulder but back feels painful    HPI Patient is in today for a follow up on chronic conditions. She is feeling well today. No recent febrile illness or hospitalizations. She is struggling with pain in her posterior left shoulder but is working with physical therapy and notes this is helpful. No complaints of fall or trauma causing the pain. Denies CP/palp/SOB/HA/congestion/fevers/GI or GU c/o. Taking meds as prescribed   Past Medical History:  Diagnosis Date   Allergy    Anemia    past hx    Arthropathy of cervical spine 11/27/2012   Right  Xray at [facility]  On 04/08/2016  AP, lateral, and lateral flexion and extension views of the cervical spine are submitted for evaluation.   No acute fracture identified in the cervical spine.   There is redemonstration of approximately 2 mm of C3 on C4 anterolisthesis in neutral which slightly increases in flexion relative to neutral and extension. Slight C5 on C6 retrolisthesis does not change i   Benign hypertension without congestive heart failure    Benign mole    right hcets mole, bleeds when dried with towel   Bilateral dry eyes    Bladder cancer (HCC) 10/29/2016   Cervical spondylosis    Cervicalgia    2 screws in place    Chronic kidney disease    kidney stones    Depression with anxiety 10-29-2016   prior to husbands death    Gross hematuria    developed after first hip replacment, led to indicental finding of bladder tumor, bleeding now resolved    History of kidney stones    1970's   Left hand weakness    due to reinjury of flxor tendon s/p tendon surgery march 12-2018   Lumbar stenosis    Malignant tumor of urinary bladder (HCC) 07/2016   Pre-stage I   OA (osteoarthritis)    Parkinson disease [facility]) dx 2012   neurologist-  dr siddiqui at [facility]    Psoriatic arthritis [facility])     Dr Nickola Major , [place] rheum    Rupture of flexor tendon of hand 2020   right    Past Surgical History:  Procedure Laterality Date   BIOPSY MASS LEFT FIRST METACARPAL   06/23/2006   benign   CATARACT EXTRACTION W/ INTRAOCULAR LENS  IMPLANT, BILATERAL  1999 and 2003   CERVICAL FUSION  02/2017   c1-c2 ; reports she has 2 screws 2in long in place done at [facility] ; patient exhibits VERY LIMITED NECK ROM   COLONOSCOPY     CYSTOSCOPY W/ RETROGRADES Bilateral 09/22/2016   Procedure: CYSTOSCOPY WITH RETROGRADE PYELOGRAM;  Surgeon: Sebastian Ache, MD;  [facility];  Service: Urology;  Laterality: Bilateral;   D & C HYSTEROSCOPY W/ RESECTION POLYP  07/11/2000   DILATION AND CURETTAGE OF UTERUS  1960's   EXCISION CYST AND DEBRIDEMENT RIGHT WIRST AND REMOVAL FORGEIGN BODY  01/09/2009   FLEXOR TENDON REPAIR Left 12/12/2018   Procedure: REPAIR/TRANSFER FLEXOR DIGITORUM PROFUNDUS OF LEFT SMALL FINGER;  Surgeon: Cindee Salt, MD;  Location: [facility];  Service: Orthopedics;  Laterality: Left;   LAPAROSCOPY  yrs ago   infertility    METACARPOPHALANGEAL JOINT ARTHRODESIS Right 05/25/1996  TENDON REPAIR Left 01/15/2019   Hand   TONSILLECTOMY AND ADENOIDECTOMY  child   TOTAL HIP ARTHROPLASTY Left 07/07/2016   Procedure: LEFT TOTAL HIP ARTHROPLASTY ANTERIOR APPROACH;  Surgeon: Ollen Gross, MD;  [facility];  Service: Orthopedics;  Laterality: Left;   TOTAL HIP ARTHROPLASTY Right 09/28/2017   Procedure: RIGHT TOTAL HIP ARTHROPLASTY ANTERIOR APPROACH;  Surgeon: Ollen Gross, MD;  Location[facility];  Service: Orthopedics;  Laterality: Right;   TOTAL KNEE ARTHROPLASTY Right 07/30/2019   Procedure: TOTAL KNEE ARTHROPLASTY;  Surgeon: Ollen Gross, MD;  [facility];  Service: Orthopedics;  Laterality: Right;    TRANSURETHRAL RESECTION OF BLADDER TUMOR N/A 09/22/2016   Procedure: TRANSURETHRAL RESECTION OF BLADDER TUMOR  (TURBT);  Surgeon: Sebastian Ache, MD;  Location: [facility];  Service: Urology;  Laterality: N/A;    Family History  Problem Relation Age of Onset   Arthritis Mother    Hypertension Mother    Deep vein thrombosis Mother        recurrent, secondary to Bleeding disorder   Arthritis Father    Stroke Father    Bladder Cancer Father    Diabetes Sister    Heart disease Sister    Obesity Sister    Arthritis Sister    Hypertension Sister    Heart disease Maternal Grandmother    Heart disease Maternal Grandfather    Peripheral vascular disease Paternal Grandmother        s/p leg amputation   Stroke Paternal Grandfather    Esophageal cancer Paternal Aunt    Colon cancer Neg Hx    Colon polyps Neg Hx    Rectal cancer Neg Hx    Stomach cancer Neg Hx     Social History   Socioeconomic History   Marital status: Widowed    Spouse name: Not on file   Number of children: Not on file   Years of education: Not on file   Highest education level: Not on file  Occupational History   Not on file  Tobacco Use   Smoking status: Former    Years: 10.00    Types: Cigarettes    Quit date: 05/04/1964    Years since quitting: 57.7   Smokeless tobacco: Never  Vaping Use   Vaping Use: Never used  Substance and Sexual Activity   Alcohol use: Yes    Alcohol/week: 7.0 standard drinks    Types: 7 Glasses of wine per week    Comment: ocassionally- social    Drug use: No   Sexual activity: Not Currently    Partners: Male  Other Topics Concern   Not on file  Social History Narrative   Not on file   Social Determinants of Health   Financial Resource Strain: Low Risk    Difficulty of Paying Living Expenses: Not hard at all  Food Insecurity: No Food Insecurity   Worried About Programme researcher, broadcasting/film/video in the Last Year: Never true   Ran Out of Food in the Last Year: Never true  Transportation Needs: No Transportation Needs   Lack of Transportation (Medical): No   Lack of  Transportation (Non-Medical): No  Physical Activity: Sufficiently Active   Days of Exercise per Week: 5 days   Minutes of Exercise per Session: 30 min  Stress: No Stress Concern Present   Feeling of Stress : Not at all  Social Connections: Moderately Integrated   Frequency of Communication with Friends and Family: More than three times a week  Frequency of Social Gatherings with Friends and Family: More than three times a week   Attends Religious Services: More than 4 times per year   Active Member of Clubs or Organizations: Yes   Attends Banker Meetings: More than 4 times per year   Marital Status: Widowed  Catering manager Violence: Not At Risk   Fear of Current or Ex-Partner: No   Emotionally Abused: No   Physically Abused: No   Sexually Abused: No    Outpatient Medications Prior to Visit  Medication Sig Dispense Refill   amLODipine (NORVASC) 5 MG tablet Take 1 tablet (5 mg total) by mouth daily. 90 tablet 3   budesonide (ENTOCORT EC) 3 MG 24 hr capsule Take 3 mg by mouth as needed.     carbidopa-levodopa (SINEMET IR) 25-100 MG per tablet Take 1 tablet by mouth 4 (four) times daily.      Carbidopa-Levodopa ER (SINEMET CR) 25-100 MG tablet controlled release Take 1 tablet by mouth at bedtime.     cholecalciferol (VITAMIN D3) 25 MCG (1000 UNIT) tablet Take 1,000 Units by mouth daily.     cholestyramine (QUESTRAN) 4 g packet Take 4 g by mouth as needed.     diazepam (VALIUM) 5 MG tablet TAKE 1 TABLET (5 MG TOTAL) BY MOUTH EVERY 12 (TWELVE) HOURS AS NEEDED. FOR ANXIETY 60 tablet 1   estradiol (ESTRACE) 0.5 MG tablet Take 0.5 tablets (0.25 mg total) by mouth daily. 45 tablet 1   famotidine (PEPCID) 40 MG tablet TAKE 1 TABLET BY MOUTH AT BEDTIME AS NEEDED FOR HEARTBURN OR INDIGESTION 90 tablet 0   fluticasone (CUTIVATE) 0.05 % cream Apply 1 application topically daily as needed (scalp psoriasis).  1   golimumab (SIMPONI ARIA) 50 MG/4ML SOLN injection Inject 50 mg into the  vein See admin instructions. Given every 2 months - Last given 06/28/2019     hydrochlorothiazide (HYDRODIURIL) 25 MG tablet Take 1.5 tablets (37.5 mg total) by mouth daily. 135 tablet 1   medroxyPROGESTERone (PROVERA) 2.5 MG tablet Take 0.5 tablets (1.25 mg total) by mouth daily. 45 tablet 3   melatonin 5 MG TABS Take 5 mg by mouth at bedtime as needed.     meloxicam (MOBIC) 15 MG tablet Take 15 mg by mouth daily.     Multiple Vitamins-Minerals (CENTRUM SILVER 50+WOMEN) TABS Take by mouth daily.     Probiotic Product (PROBIOTIC PO) Take by mouth.     sertraline (ZOLOFT) 25 MG tablet Take 1 tablet (25 mg total) by mouth daily. (Patient taking differently: Take 25 mg by mouth as needed.) 90 tablet 1   vitamin C (ASCORBIC ACID) 500 MG tablet Take 500 mg by mouth daily.     No facility-administered medications prior to visit.    Allergies  Allergen Reactions   Clindamycin Hcl Diarrhea   Codeine Nausea And Vomiting   Doxycycline Diarrhea   Hornet Venom    Penicillins Itching and Swelling    Has patient had a PCN reaction causing immediate rash, facial/tongue/throat swelling, SOB or lightheadedness with hypotension: Yes Has patient had a PCN reaction causing severe rash involving mucus membranes or skin necrosis: No Has patient had a PCN reaction that required hospitalization: No Has patient had a PCN reaction occurring within the last 10 years: No If all of the above answers are "NO", then may proceed with Cephalosporin use.    Yellow Jacket Venom Swelling   Lodine [Etodolac] Rash   Tramadol Itching and Rash    Review  of Systems  Constitutional:  Negative for fever and malaise/fatigue.  HENT:  Negative for congestion.   Eyes:  Negative for blurred vision.  Respiratory:  Negative for shortness of breath.   Cardiovascular:  Negative for chest pain, palpitations and leg swelling.  Gastrointestinal:  Negative for abdominal pain, blood in stool and nausea.  Genitourinary:  Negative for  dysuria and frequency.  Musculoskeletal:  Positive for joint pain. Negative for falls.  Skin:  Negative for rash.  Neurological:  Negative for dizziness, loss of consciousness and headaches.  Endo/Heme/Allergies:  Negative for environmental allergies.  Psychiatric/Behavioral:  Negative for depression. The patient is not nervous/anxious.       Objective:    Physical Exam Constitutional:      General: She is not in acute distress.    Appearance: She is well-developed.  HENT:     Head: Normocephalic and atraumatic.  Eyes:     Conjunctiva/sclera: Conjunctivae normal.  Neck:     Thyroid: No thyromegaly.  Cardiovascular:     Rate and Rhythm: Normal rate and regular rhythm.     Heart sounds: Normal heart sounds. No murmur heard. Pulmonary:     Effort: Pulmonary effort is normal. No respiratory distress.     Breath sounds: Normal breath sounds.  Abdominal:     General: Bowel sounds are normal. There is no distension.     Palpations: Abdomen is soft. There is no mass.     Tenderness: There is no abdominal tenderness.  Musculoskeletal:     Cervical back: Neck supple.  Lymphadenopathy:     Cervical: No cervical adenopathy.  Skin:    General: Skin is warm and dry.  Neurological:     Mental Status: She is alert and oriented to person, place, and time.  Psychiatric:        Behavior: Behavior normal.    BP 130/78 (BP Location: Right Arm, Patient Position: Sitting, Cuff Size: Normal)   Pulse 80   Resp 20   Ht 5\' 2"  (1.575 m)   Wt 119 lb 9.6 oz (54.3 kg)   LMP 01/13/2013 Comment: spotting-had benign endometrial biopsy   SpO2 99%   BMI 21.88 kg/m  Wt Readings from Last 3 Encounters:  01/26/22 119 lb 9.6 oz (54.3 kg)  09/14/21 123 lb (55.8 kg)  05/14/21 122 lb 9.6 oz (55.6 kg)    Diabetic Foot Exam - Simple   No data filed    Lab Results  Component Value Date   WBC 6.2 09/14/2021   HGB 14.0 09/14/2021   HCT 41.9 09/14/2021   PLT 198.0 09/14/2021   GLUCOSE 98 09/14/2021    CHOL 177 09/14/2021   TRIG 97.0 09/14/2021   HDL 55.40 09/14/2021   LDLDIRECT 120.0 12/11/2014   LDLCALC 102 (H) 09/14/2021   ALT 7 09/14/2021   AST 24 09/14/2021   NA 134 (L) 09/14/2021   K 3.5 09/14/2021   CL 94 (L) 09/14/2021   CREATININE 0.74 09/14/2021   BUN 18 09/14/2021   CO2 31 09/14/2021   TSH 0.94 09/14/2021   INR 1.0 07/25/2019   HGBA1C 5.1 02/14/2020    Lab Results  Component Value Date   TSH 0.94 09/14/2021   Lab Results  Component Value Date   WBC 6.2 09/14/2021   HGB 14.0 09/14/2021   HCT 41.9 09/14/2021   MCV 91.6 09/14/2021   PLT 198.0 09/14/2021   Lab Results  Component Value Date   NA 134 (L) 09/14/2021   K 3.5 09/14/2021  CO2 31 09/14/2021   GLUCOSE 98 09/14/2021   BUN 18 09/14/2021   CREATININE 0.74 09/14/2021   BILITOT 1.0 09/14/2021   ALKPHOS 35 (L) 09/14/2021   AST 24 09/14/2021   ALT 7 09/14/2021   PROT 6.3 09/14/2021   ALBUMIN 4.7 09/14/2021   CALCIUM 9.5 09/14/2021   ANIONGAP 10 07/31/2019   GFR 72.62 09/14/2021   Lab Results  Component Value Date   CHOL 177 09/14/2021   Lab Results  Component Value Date   HDL 55.40 09/14/2021   Lab Results  Component Value Date   LDLCALC 102 (H) 09/14/2021   Lab Results  Component Value Date   TRIG 97.0 09/14/2021   Lab Results  Component Value Date   CHOLHDL 3 09/14/2021   Lab Results  Component Value Date   HGBA1C 5.1 02/14/2020       Assessment & Plan:   Problem List Items Addressed This Visit     HTN (hypertension) - Primary    Well controlled, no changes to meds. Encouraged heart healthy diet such as the DASH diet and exercise as tolerated.       Parkinson's disease (HCC)   Hyperlipidemia    Encourage heart healthy diet such as MIND or DASH diet, increase exercise, avoid trans fats, simple carbohydrates and processed foods, consider a krill or fish or flaxseed oil cap daily.       Depression with anxiety   Psoriatic arthritis (HCC)   Vitamin D deficiency     Supplement and monitor      Left shoulder pain    She is undergoing physical therapy and that has been helpful. Encouraged to try ice and/or heat. Stay as active as able. Notify if worsens.

## 2022-01-27 NOTE — Assessment & Plan Note (Signed)
Supplement and monitor 

## 2022-01-27 NOTE — Assessment & Plan Note (Signed)
Encourage heart healthy diet such as MIND or DASH diet, increase exercise, avoid trans fats, simple carbohydrates and processed foods, consider a krill or fish or flaxseed oil cap daily.  °

## 2022-01-27 NOTE — Assessment & Plan Note (Signed)
Well controlled, no changes to meds. Encouraged heart healthy diet such as the DASH diet and exercise as tolerated.  °

## 2022-01-27 NOTE — Assessment & Plan Note (Signed)
She is undergoing physical therapy and that has been helpful. Encouraged to try ice and/or heat. Stay as active as able. Notify if worsens.  ?

## 2022-02-01 DIAGNOSIS — L603 Nail dystrophy: Secondary | ICD-10-CM | POA: Diagnosis not present

## 2022-02-01 DIAGNOSIS — I739 Peripheral vascular disease, unspecified: Secondary | ICD-10-CM | POA: Diagnosis not present

## 2022-02-03 ENCOUNTER — Ambulatory Visit: Payer: Medicare Other | Admitting: Physical Therapy

## 2022-02-03 ENCOUNTER — Other Ambulatory Visit: Payer: Self-pay

## 2022-02-03 ENCOUNTER — Encounter: Payer: Self-pay | Admitting: Physical Therapy

## 2022-02-03 DIAGNOSIS — R2681 Unsteadiness on feet: Secondary | ICD-10-CM

## 2022-02-03 DIAGNOSIS — M6281 Muscle weakness (generalized): Secondary | ICD-10-CM

## 2022-02-03 DIAGNOSIS — M546 Pain in thoracic spine: Secondary | ICD-10-CM

## 2022-02-03 DIAGNOSIS — M542 Cervicalgia: Secondary | ICD-10-CM

## 2022-02-03 DIAGNOSIS — R293 Abnormal posture: Secondary | ICD-10-CM | POA: Diagnosis not present

## 2022-02-03 DIAGNOSIS — G8929 Other chronic pain: Secondary | ICD-10-CM | POA: Diagnosis not present

## 2022-02-03 DIAGNOSIS — M25612 Stiffness of left shoulder, not elsewhere classified: Secondary | ICD-10-CM | POA: Diagnosis not present

## 2022-02-03 DIAGNOSIS — R2689 Other abnormalities of gait and mobility: Secondary | ICD-10-CM

## 2022-02-03 DIAGNOSIS — M25512 Pain in left shoulder: Secondary | ICD-10-CM | POA: Diagnosis not present

## 2022-02-03 NOTE — Therapy (Signed)
[place] ?[facility] ?[address] ?[place], [place], [address] ?Phone: [phone]   Fax:  [phone] ? ?Physical Therapy Treatment ? ?Patient Details  ?Name: Stephanie [name] ?MRN: [id number] ?Date of Birth: [birth year] ?Referring Provider (PT): Penni Homans, MD ? ? ?Encounter Date: 02/03/2022 ? ? PT End of Session - 02/03/22 1104   ? ? Visit Number 17   ? Number of Visits 20   ? Date for PT Re-Evaluation 02/17/22   ? Authorization Type Medicare & BCBS   ? PT Start Time 1104   ? PT Stop Time 1147   ? PT Time Calculation (min) 43 min   ? Activity Tolerance Patient tolerated treatment well   ? Behavior During Therapy [facility] for tasks assessed/performed   ? ?  ?  ? ?  ? ? ?Past Medical History:  ?Diagnosis Date  ? Allergy   ? Anemia   ? past hx   ? Arthropathy of cervical spine 11/27/2012  ? Right  Xray at [facility]  On 04/08/2016  AP, lateral, and lateral flexion and extension views of the cervical spine are submitted for evaluation.   No acute fracture identified in the cervical spine.   There is redemonstration of approximately 2 mm of C3 on C4 anterolisthesis in neutral which slightly increases in flexion relative to neutral and extension. Slight C5 on C6 retrolisthesis does not change i  ? Benign hypertension without congestive heart failure   ? Benign mole   ? right hcets mole, bleeds when dried with towel  ? Bilateral dry eyes   ? Bladder cancer ([place]) 27-Oct-2016  ? Cervical spondylosis   ? Cervicalgia   ? 2 screws in place   ? Chronic kidney disease   ? kidney stones   ? Depression with anxiety Oct 27, 2016  ? prior to husbands death   ? Gross hematuria   ? developed after first hip replacment, led to indicental finding of bladder tumor, bleeding now resolved   ? History of kidney stones   ? 1970's  ? Left hand weakness   ? due to reinjury of flxor tendon s/p tendon surgery march 12-2018  ? Lumbar stenosis   ? Malignant tumor of urinary bladder ([place]) 07/2016  ? Pre-stage I   ? OA (osteoarthritis)   ? Parkinson disease [facility]) dx 2012  ? neurologist-  dr siddiqui at [facility]  ? Psoriatic arthritis [facility])   ?  Dr Trudie Reed , [place] [facility]   ? Rupture of flexor tendon of hand 2020  ? right  ? ? ?Past Surgical History:  ?Procedure Laterality Date  ? BIOPSY MASS LEFT FIRST METACARPAL   06/23/2006  ? benign  ? CATARACT EXTRACTION W/ INTRAOCULAR LENS  IMPLANT, BILATERAL  1999 and 2003  ? CERVICAL FUSION  02/2017  ? c1-c2 ; reports she has 2 screws 2in long in place done at [facility] ; patient exhibits VERY LIMITED NECK ROM  ? COLONOSCOPY    ? CYSTOSCOPY W/ RETROGRADES Bilateral 09/22/2016  ? Procedure: CYSTOSCOPY WITH RETROGRADE PYELOGRAM;  Surgeon: Alexis Frock, MD;  Location: [facility];  Service: Urology;  Laterality: Bilateral;  ? D & C HYSTEROSCOPY W/ RESECTION POLYP  07/11/2000  ? DILATION AND CURETTAGE OF UTERUS  1960's  ? EXCISION CYST AND DEBRIDEMENT RIGHT WIRST AND REMOVAL [place] BODY  01/09/2009  ? FLEXOR TENDON REPAIR Left 12/12/2018  ? Procedure: REPAIR/TRANSFER FLEXOR DIGITORUM PROFUNDUS OF LEFT SMALL FINGER;  Surgeon: Daryll Brod, MD;  Location: [place];  Service: Orthopedics;  Laterality: Left;  ? LAPAROSCOPY  yrs ago  ? infertility   ? METACARPOPHALANGEAL JOINT ARTHRODESIS Right 05/25/1996  ? TENDON REPAIR Left 01/15/2019  ? Hand  ? TONSILLECTOMY AND ADENOIDECTOMY  child  ? TOTAL HIP ARTHROPLASTY Left 07/07/2016  ? Procedure: LEFT TOTAL HIP ARTHROPLASTY ANTERIOR APPROACH;  Surgeon: Gaynelle Arabian, MD;  [facility];  Service: Orthopedics;  Laterality: Left;  ? TOTAL HIP ARTHROPLASTY Right 09/28/2017  ? Procedure: RIGHT TOTAL HIP ARTHROPLASTY ANTERIOR APPROACH;  Surgeon: Gaynelle Arabian, MD;  [facility];  Service: Orthopedics;  Laterality: Right;  ? TOTAL KNEE ARTHROPLASTY Right 07/30/2019  ? Procedure: TOTAL KNEE ARTHROPLASTY;  Surgeon: Gaynelle Arabian, MD;  [facility];  Service: Orthopedics;  Laterality: Right;  61mn  ?  TRANSURETHRAL RESECTION OF BLADDER TUMOR N/A 09/22/2016  ? Procedure: TRANSURETHRAL RESECTION OF BLADDER TUMOR (TURBT);  Surgeon: TAlexis Frock MD;  [facility]  Service: Urology;  Laterality: N/A;  ? ? ?There were no vitals filed for this visit. ? ? Subjective Assessment - 02/03/22 1105   ? ? Subjective Pt reports pain in R low back and leg upon waking this morning. She used the 3-way lumbar flexion stretch with the cane and able to resolve the pain.   ? Pertinent History R TKA 07/2019, B THR 2018 & 2017, posterior C1-2 cervical fusion 2018, rupture of flexor tendon of L hand with failed surgical repair 2020, Parkinson?s, bladder cancer, HTN   ? Patient Stated Goals "less pain - able to dress upper body or put on a coat w/o pain"   ? Currently in Pain? No/denies   ? ?  ?  ? ?  ? ? ? ? ? ? ? ? ? ? ? ? ? ? ? ? ? ? ? ? [facility] - 02/03/22 1104   ? ?  ? Neck Exercises: Stretches  ? Other Neck Stretches B rhomboid & LS stretch with arms extended and hands clasped, rounding shoulders forward 2 x 30 sec   pt instructed to raise or lower her arms to achieve the stretch at the desired level  ?  ? Lumbar Exercises: Stretches  ? Other Lumbar Stretch Exercise Seated 3-way lumbar flexion sretch with SPC for upper body support x 30 sec each - pt noting L shoulder stretch with stretch to R   ?  ? Knee/Hip Exercises: Aerobic  ? Recumbent Bike L3 x 6 min   ?  ? Shoulder Exercises: Stretch  ? Other Shoulder Stretches L posterior capsule stretch 3 x 30 sec   ?  ? Manual Therapy  ? Manual Therapy Soft tissue mobilization;Myofascial release   ? Soft tissue mobilization STM/DTM to L LS, rhomboids, supscapularis and infraspinatus   ? Myofascial Release manual TPR to L LS, rhomboids and supscapularis   ? Scapular Mobilization L scapular mobilization - all directions with subscapular release   ? ?  ?  ? ?  ? ? ? ? ? ? ? ? ? ? PT Education - 02/03/22 1147   ? ? Education Details HEP update -  posterior capsule stretch   ? Person(s) Educated Patient   ? Methods Explanation;Demonstration;Verbal cues;Handout   ? Comprehension Verbalized understanding;Verbal cues required;Returned demonstration;Need further instruction   ? ?  ?  ? ?  ? ? ? PT Short Term Goals - 11/17/21 1508   ? ?  ? PT SHORT TERM GOAL #1  ? Title Patient  will be independent with initial HEP   ? Status Achieved   11/12/21  ? Target Date 11/16/21   ?  ? PT SHORT TERM GOAL #2  ? Title Patient will demonstrate decreased tightness/spasm in the L upper trap/ levator scapulae to decrease pain to </= 5/10 with movement and promote improved cervical and L shoulder mobility   ? Status Achieved   11/12/21 - Pain typically only 2-3/10 at rest and no more than 5/10 with activity  ? Target Date 11/16/21   ?  ? PT SHORT TERM GOAL #3  ? Title Patient will be able to verbalize and demonstrate proper posture with sitting/standing to decrease/reduce muscle tightness/reinjury   ? Status Achieved   11/17/21  ? Target Date 11/16/21   ?  ? PT SHORT TERM GOAL #4  ? Title Establish further STGs/LTGs as indicated based on assessment of deficits related to Parkinson's disease   ? Status Achieved   10/26/21  ? ?  ?  ? ?  ? ? ? ? PT Long Term Goals - 01/20/22 0930   ? ?  ? PT LONG TERM GOAL #1  ? Title Patient will be independent with ongoing/advanced HEP for self-management at home   ? Status Partially Met   01/05/22 - met for current HEP  ? Target Date 02/17/22   ?  ? PT LONG TERM GOAL #2  ? Title Patient to improve L shoudler AROM to [facility] without pain provocation to allow for increased ease of UB dressing   ? Status Partially Met   01/05/22 - L shoulder AROM now [facility] but pain still noted with some motion  ? Target Date 02/17/22   ?  ? PT LONG TERM GOAL #3  ? Title Patient to improve cervical AROM by >/= 25% with pain decreased to </= 5/10 with movement to allow for increased participation in church services and improve safety with driving   ? Status Partially Met    01/05/22 - Pt reports 20% improvement in neck pain (0/10 at rest, but 2-3/10 with mobility and activity). Driving improved but she notes she still positions herself directly in front of the pulpit at church to av

## 2022-02-03 NOTE — Patient Instructions (Addendum)
? ? ?  Access Code: [email or web address] ?URL: [email or web address] ?Date: 02/03/2022 ?Prepared by: Annie Paras ? ?Exercises ?Shoulder Rolls in Sitting - 2-3 x daily - 7 x weekly - 2 sets - 10 reps ?Seated Scapular Retraction - 2-3 x daily - 7 x weekly - 2 sets - 10 reps - 5 sec hold ?Doorway Pec Stretch at 60 Degrees Abduction with Arm Straight - 2-3 x daily - 7 x weekly - 3 reps - 30 sec hold ?Standing Bilateral Low Shoulder Row with Anchored Resistance - 1 x daily - 7 x weekly - 2 sets - 10 reps - 5 sec hold ?Scapular Retraction with Resistance Advanced - 1 x daily - 7 x weekly - 2 sets - 10 reps - 5 sec hold ?Seated Gentle Upper Trapezius Stretch - 2-3 x daily - 7 x weekly - 3 reps - 30 sec hold ?Gentle Levator Scapulae Stretch - 2-3 x daily - 7 x weekly - 3 reps - 30 sec hold ?Seated Rhomboid Stretch - 2-3 x daily - 7 x weekly - 3 reps - 30 sec hold ?Seated Hamstring Stretch - 2-3 x daily - 7 x weekly - 3 reps - 30 sec hold ?Seated Hip Flexor Stretch - 2-3 x daily - 7 x weekly - 3 reps - 30 sec hold ?Seated Isometric Hip Abduction with Resistance - 1 x daily - 7 x weekly - 2 sets - 10 reps - 3 sec hold ?Seated March with Resistance - 1 x daily - 7 x weekly - 2 sets - 10 reps - 3 sec hold ?Sit to Stand with Resistance Around Legs - 1 x daily - 7 x weekly - 2 sets - 5 reps ?Standing Lower Cervical and Upper Thoracic Stretch - 1 x daily - 7 x weekly - 3 reps - 30 sec hold ?Prone Single Arm Shoulder Horizontal Abduction with Scapular Retraction and Palm Down - 1 x daily - 7 x weekly - 2 sets - 10 reps - 3 sec hold ?Prone Shoulder Extension - Single Arm with Dumbbell - 1 x daily - 7 x weekly - 2 sets - 10 reps - 3 sec hold ?Seated Shoulder Diagonal with Resistance (Mirrored) - 1 x daily - 7 x weekly - 2 sets - 10 reps - 3 sec hold ?Shoulder External Rotation and Scapular Retraction with Resistance - 1 x daily - 3-4 x weekly - 2 sets - 10 reps - 3-5 sec hold ?Seated Flexion Stretch with Swiss Ball - 2 x daily - 7  x weekly - 3 reps - 30 sec hold ?Seated Thoracic Flexion and Rotation with Swiss Ball - 2 x daily - 7 x weekly - 3 reps - 30 sec hold ?Modified Posterior Capsule Stretch - 2 x daily - 7 x weekly - 3 reps - 30 sec hold ? ?Patient Education ?Posture and Body Mechanics ?

## 2022-02-10 ENCOUNTER — Ambulatory Visit: Payer: Medicare Other | Admitting: Physical Therapy

## 2022-02-10 ENCOUNTER — Other Ambulatory Visit: Payer: Self-pay

## 2022-02-10 ENCOUNTER — Encounter: Payer: Self-pay | Admitting: Physical Therapy

## 2022-02-10 DIAGNOSIS — G8929 Other chronic pain: Secondary | ICD-10-CM

## 2022-02-10 DIAGNOSIS — M25612 Stiffness of left shoulder, not elsewhere classified: Secondary | ICD-10-CM | POA: Diagnosis not present

## 2022-02-10 DIAGNOSIS — R2681 Unsteadiness on feet: Secondary | ICD-10-CM

## 2022-02-10 DIAGNOSIS — M542 Cervicalgia: Secondary | ICD-10-CM

## 2022-02-10 DIAGNOSIS — M25512 Pain in left shoulder: Secondary | ICD-10-CM | POA: Diagnosis not present

## 2022-02-10 DIAGNOSIS — M546 Pain in thoracic spine: Secondary | ICD-10-CM

## 2022-02-10 DIAGNOSIS — R293 Abnormal posture: Secondary | ICD-10-CM

## 2022-02-10 DIAGNOSIS — M6281 Muscle weakness (generalized): Secondary | ICD-10-CM

## 2022-02-10 DIAGNOSIS — R2689 Other abnormalities of gait and mobility: Secondary | ICD-10-CM

## 2022-02-10 NOTE — Therapy (Signed)
[facility] ?[address] ?[place], [place], [address] ?Phone: [phone]   Fax:  [phone] ? ?Physical Therapy Treatment ? ?Patient Details  ?Name: Stephanie [name] ?MRN: [id number] ?Date of Birth: [birth year] ?Referring Provider (PT): Penni Homans, MD ? ? ?Encounter Date: 02/10/2022 ? ? PT End of Session - 02/10/22 1058   ? ? Visit Number [id number]   ? Number of Visits 20   ? Date for PT Re-Evaluation 02/17/22   ? Authorization Type Medicare & BCBS   ? PT Start Time 1058   ? PT Stop Time 1154   ? PT Time Calculation (min) 56 min   ? Activity Tolerance Patient tolerated treatment well   ? Behavior During Therapy [facility] for tasks assessed/performed   ? ?  ?  ? ?  ? ? ?Past Medical History:  ?Diagnosis Date  ? Allergy   ? Anemia   ? past hx   ? Arthropathy of cervical spine 11/27/2012  ? Right  Xray at [facility]  On 04/08/2016  AP, lateral, and lateral flexion and extension views of the cervical spine are submitted for evaluation.   No acute fracture identified in the cervical spine.   There is redemonstration of approximately 2 mm of C3 on C4 anterolisthesis in neutral which slightly increases in flexion relative to neutral and extension. Slight C5 on C6 retrolisthesis does not change i  ? Benign hypertension without congestive heart failure   ? Benign mole   ? right hcets mole, bleeds when dried with towel  ? Bilateral dry eyes   ? Bladder cancer ([place]) 18-Oct-2016  ? Cervical spondylosis   ? Cervicalgia   ? 2 screws in place   ? Chronic kidney disease   ? kidney stones   ? Depression with anxiety 10-18-2016  ? prior to husbands death   ? Gross hematuria   ? developed after first hip replacment, led to indicental finding of bladder tumor, bleeding now resolved   ? History of kidney stones   ? 1970's  ? Left hand weakness   ? due to reinjury of flxor tendon s/p tendon surgery march 12-2018  ? Lumbar stenosis   ? Malignant tumor of urinary bladder ([place]) 07/2016  ? Pre-stage I   ? OA (osteoarthritis)   ? Parkinson disease [facility]) dx 2012  ? neurologist-  dr siddiqui at [facility]  ? Psoriatic arthritis [facility])   ?  Dr Trudie Reed , [place] [facility]   ? Rupture of flexor tendon of hand 2020  ? right  ? ? ?Past Surgical History:  ?Procedure Laterality Date  ? BIOPSY MASS LEFT FIRST METACARPAL   06/23/2006  ? benign  ? CATARACT EXTRACTION W/ INTRAOCULAR LENS  IMPLANT, BILATERAL  1999 and 2003  ? CERVICAL FUSION  02/2017  ? c1-c2 ; reports she has 2 screws 2in long in place done at [facility] ; patient exhibits VERY LIMITED NECK ROM  ? COLONOSCOPY    ? CYSTOSCOPY W/ RETROGRADES Bilateral 09/22/2016  ? Procedure: CYSTOSCOPY WITH RETROGRADE PYELOGRAM;  Surgeon: Alexis Frock, MD;  Location: [facility];  Service: Urology;  Laterality: Bilateral;  ? D & C HYSTEROSCOPY W/ RESECTION POLYP  07/11/2000  ? DILATION AND CURETTAGE OF UTERUS  1960's  ? EXCISION CYST AND DEBRIDEMENT RIGHT WIRST AND REMOVAL [place] BODY  01/09/2009  ? FLEXOR TENDON REPAIR Left 12/12/2018  ? Procedure: REPAIR/TRANSFER FLEXOR DIGITORUM PROFUNDUS OF LEFT SMALL FINGER;  Surgeon: Daryll Brod, MD;  Location: [facility];  Service: Orthopedics;  Laterality: Left;  ? LAPAROSCOPY  yrs ago  ? infertility   ? METACARPOPHALANGEAL JOINT ARTHRODESIS Right 05/25/1996  ? TENDON REPAIR Left 01/15/2019  ? Hand  ? TONSILLECTOMY AND ADENOIDECTOMY  child  ? TOTAL HIP ARTHROPLASTY Left 07/07/2016  ? Procedure: LEFT TOTAL HIP ARTHROPLASTY ANTERIOR APPROACH;  Surgeon: Gaynelle Arabian, MD;  Location: [facility];  Service: Orthopedics;  Laterality: Left;  ? TOTAL HIP ARTHROPLASTY Right 09/28/2017  ? Procedure: RIGHT TOTAL HIP ARTHROPLASTY ANTERIOR APPROACH;  Surgeon: Gaynelle Arabian, MD;  Location: [facility];  Service: Orthopedics;  Laterality: Right;  ? TOTAL KNEE ARTHROPLASTY Right 07/30/2019  ? Procedure: TOTAL KNEE ARTHROPLASTY;  Surgeon: Gaynelle Arabian, MD;  Location: [facility];  Service: Orthopedics;  Laterality: Right;  24min  ?  TRANSURETHRAL RESECTION OF BLADDER TUMOR N/A 09/22/2016  ? Procedure: TRANSURETHRAL RESECTION OF BLADDER TUMOR (TURBT);  Surgeon: Alexis Frock, MD;  Location[facility];  Service: Urology;  Laterality: N/A;  ? ? ?There were no vitals filed for this visit. ? ? Subjective Assessment - 02/10/22 1102   ? ? Subjective Pt notes increased achiness and stiffness from the cold front that came through last night. She notes less sleep interruption due pain in her shoulder blade but now noting pain in her shoulder.   ? Pertinent History R TKA 07/2019, B THR 2018 & 2017, posterior C1-2 cervical fusion 2018, rupture of flexor tendon of L hand with failed surgical repair 2020, Parkinson?s, bladder cancer, HTN   ? Patient Stated Goals "less pain - able to dress upper body or put on a coat w/o pain"   ? Currently in Pain? No/denies   ? ?  ?  ? ?  ? ? ? ? ? ? ? ? ? ? ? ? ? ? ? ? ? ? ? ? [place] Adult PT Treatment/Exercise - 02/10/22 1058   ? ?  ? Knee/Hip Exercises: Aerobic  ? Nustep L7 x 6 min (UE/LE to promote reciprocal motion)   ?  ? Shoulder Exercises: Stretch  ? Other Shoulder Stretches L posterior capsule stretch 3 x 30 sec   increased posterolateral shoulder pain reported but better following manual TPR and cues for scap retraction while stretching  ?  ? Manual Therapy  ? Manual Therapy Soft tissue mobilization;Myofascial release   ? Soft tissue mobilization STM/DTM to L posterolateral deltoid, teres group and infraspinatus   ? Myofascial Release manual TPR to L posterolateral deltoid, teres group and infraspinatus - initially very sensitive to deep pressure but gradually improving   ? ?  ?  ? ?  ? ? ? ? [facility]) - 02/10/22 1058   ? ? PWR! exercises Moves in sitting;Moves in standing   ? PWR! Up x10   ? PWR! Rock x10   ? PWR! Twist x10   ? PWR Step x10   ? PWR! Up x10   ? PWR! Rock x10   ? PWR! Twist x10   ? PWR! Step x10   ? ?  ?  ? ?  ? ? ? ? ? ? ? PT Education - 02/10/22 1152   ? ? Education Details HEP  reviewed and condensed, adjusting frequencies for maintenance level with majority of focus on shoulder exercises; review of seated and standing PWR! Moves   ? Person(s) Educated Patient   ? Methods Explanation   ? Comprehension Verbalized understanding   ? ?  ?  ? ?  ? ? ?  PT Short Term Goals - 02/10/22 1152   ? ?  ? PT SHORT TERM GOAL #1  ? Title Patient will be independent with initial HEP   ? Status Achieved   11/12/21  ?  ? PT SHORT TERM GOAL #2  ? Title Patient will demonstrate decreased tightness/spasm in the L upper trap/ levator scapulae to decrease pain to </= 5/10 with movement and promote improved cervical and L shoulder mobility   ? Status Achieved   11/12/21  ?  ? PT SHORT TERM GOAL #3  ? Title Patient will be able to verbalize and demonstrate proper posture with sitting/standing to decrease/reduce muscle tightness/reinjury   ? Status Achieved   11/17/21  ?  ? PT SHORT TERM GOAL #4  ? Title Establish further STGs/LTGs as indicated based on assessment of deficits related to Parkinson's disease   ? Status Achieved   10/26/21  ? ?  ?  ? ?  ? ? ? ? PT Long Term Goals - 02/10/22 1154   ? ?  ? PT LONG TERM GOAL #1  ? Title Patient will be independent with ongoing/advanced HEP for self-management at home   ? Status Achieved   02/10/22  ?  ? PT LONG TERM GOAL #2  ? Title Patient to improve L shoudler AROM to [facility] without pain provocation to allow for increased ease of UB dressing   ? Status Partially Met   01/05/22 - L shoulder AROM now [facility] but pain still noted with some motion  ? [place] Date 02/17/22   ?  ? PT LONG TERM GOAL #3  ? Title Patient to improve cervical AROM by >/= 25% with pain decreased to </= 5/10 with movement to allow for increased participation in church services and improve safety with driving   ? Status Partially Met   01/05/22 - Pt reports 20% improvement in neck pain (0/10 at rest, but 2-3/10 with mobility and activity). Driving improved but she notes she still positions herself directly in  front of the pulpit at church to avoid having to turn her head d/t pain.  ? Target Date 02/17/22   ?  ? PT LONG TERM GOAL #4  ? Title Patient will improve FGA score to >/= 25/30 to improve gait stability and reduc

## 2022-02-10 NOTE — Patient Instructions (Signed)
Access Code: [email or web address] ?URL: [email or web address] ?Date: 02/10/2022 ?Prepared by: Annie Paras ? ?Exercises ?- Shoulder Rolls in Sitting  - 2 sets - 10 reps ?- Seated Scapular Retraction  - 2 sets - 10 reps - 5 sec hold ?- Doorway Pec Stretch at 60 Degrees Abduction with Arm Straight  - 1 x daily - 7 x weekly - 3 reps - 30 sec hold ?- Standing Bilateral Low Shoulder Row with Anchored Resistance  - 1 x daily - 5 x weekly - 2 sets - 10 reps - 5 sec hold ?- Scapular Retraction with Resistance Advanced  - 1 x daily - 5 x weekly - 2 sets - 10 reps - 5 sec hold ?- Seated Gentle Upper Trapezius Stretch  - 1 x daily - 7 x weekly - 3 reps - 30 sec hold ?- Gentle Levator Scapulae Stretch  - 1 x daily - 7 x weekly - 3 reps - 30 sec hold ?- Seated Rhomboid Stretch  - 1 x daily - 7 x weekly - 3 reps - 30 sec hold ?- Standing Lower Cervical and Upper Thoracic Stretch  - 1 x daily - 7 x weekly - 3 reps - 30 sec hold ?- Seated Hamstring Stretch  - 1 x daily - 7 x weekly - 3 reps - 30 sec hold ?- Seated Hip Flexor Stretch  - 1 x daily - 7 x weekly - 3 reps - 30 sec hold ?- Seated Isometric Hip Abduction with Resistance  - 1 x daily - 3 x weekly - 2 sets - 10 reps - 3 sec hold ?- Seated March with Resistance  - 1 x daily - 3 x weekly - 2 sets - 10 reps - 3 sec hold ?- Sit to Stand with Resistance Around Legs  - 1 x daily - 3 x weekly - 2 sets - 5 reps ?- Seated Shoulder Diagonal with Resistance (Mirrored)  - 1 x daily - 5 x weekly - 2 sets - 10 reps - 3 sec hold ?- Shoulder External Rotation and Scapular Retraction with Resistance  - 1 x daily - 3-4 x weekly - 2 sets - 10 reps - 3-5 sec hold ?- Seated Flexion Stretch with Swiss Ball  - 1 x daily - 7 x weekly - 3 reps - 30 sec hold ?- Seated Thoracic Flexion and Rotation with Swiss Ball  - 1 x daily - 7 x weekly - 3 reps - 30 sec hold ?- Modified Posterior Capsule Stretch  - 1 x daily - 7 x weekly - 3 reps - 30 sec hold ? ?Patient Education ?- Psychologist, educational ?

## 2022-02-13 ENCOUNTER — Other Ambulatory Visit: Payer: Self-pay | Admitting: Family Medicine

## 2022-02-17 ENCOUNTER — Encounter: Payer: Self-pay | Admitting: Physical Therapy

## 2022-02-17 ENCOUNTER — Ambulatory Visit: Payer: Medicare Other | Attending: Family Medicine | Admitting: Physical Therapy

## 2022-02-17 DIAGNOSIS — M542 Cervicalgia: Secondary | ICD-10-CM | POA: Diagnosis not present

## 2022-02-17 DIAGNOSIS — R2681 Unsteadiness on feet: Secondary | ICD-10-CM

## 2022-02-17 DIAGNOSIS — M6281 Muscle weakness (generalized): Secondary | ICD-10-CM | POA: Diagnosis not present

## 2022-02-17 DIAGNOSIS — R293 Abnormal posture: Secondary | ICD-10-CM

## 2022-02-17 DIAGNOSIS — M25512 Pain in left shoulder: Secondary | ICD-10-CM | POA: Diagnosis not present

## 2022-02-17 DIAGNOSIS — M25612 Stiffness of left shoulder, not elsewhere classified: Secondary | ICD-10-CM

## 2022-02-17 DIAGNOSIS — R2689 Other abnormalities of gait and mobility: Secondary | ICD-10-CM

## 2022-02-17 DIAGNOSIS — G8929 Other chronic pain: Secondary | ICD-10-CM | POA: Diagnosis not present

## 2022-02-17 DIAGNOSIS — M546 Pain in thoracic spine: Secondary | ICD-10-CM | POA: Diagnosis not present

## 2022-02-17 NOTE — Therapy (Addendum)
PHYSICAL THERAPY DISCHARGE SUMMARY  Visits from Start of Care: 19  Current functional level related to goals / functional outcomes: From progress note below "Krishika has demonstrated excellent progress with pt noting neck and L scapular pain no longer really an issue but still reports some intermittent L shoulder pain with certain motions. Despite some intermittent L shoulder pain, B shoulder ROM and strength now [facility]. B LE strength also now 4+/5 to 5/5. She has demonstrated significant gains on all standardized balance testing with 5xSTS now below age matched norms at 12.53 sec and well below the PD associated increased fall risk of >16 sec, along with TUG time of 8.88 sec also well below the increased fall risk time of >13.5 sec. FGA has improved from 20/30 to 26/30 also indicating only a low risk for falls. Gait speed well within the safe community ambulator range with good stability and gait pattern observed. All goals now met with exception of LTG #2 only partially met (met for ROM [facility] but still has pain with certain motions causing her to have to modify her movement patterns sometimes with UB dressing). Fatmata feels confident with the plan to transition to her HEP at this time, but would like to remain on hold for 30-days in the event that her L shoulder were to flare-up. "   Remaining deficits: See above   Education / Equipment: HEP  Plan: Patient agrees to discharge.  Patient is being discharged due to meeting the stated rehab goals.  She was placed on 30 day hold on 02/17/2022 and has not needed to return within stated time frame.      Rennie Natter, PT, DPT 1:50 PM 04/21/2022  [facility] [address]  [place], [place], [address] Phone: [phone]   Fax:  [phone]  Physical Therapy Treatment / Progress Note  Patient Details  Name: SHERRIAN NUNNELLEY MRN: [id number] Date of Birth: 06/03/1932 Referring Provider (PT):  Penni Homans, MD    Progress Note  Reporting Period 01/05/2022 to 02/17/2022  See note below for Objective Data and Assessment of Progress/Goals.     Encounter Date: 02/17/2022   PT End of Session - 02/17/22 1102     Visit Number 19    Number of Visits 20    Date for PT Re-Evaluation 02/17/22    Authorization Type Medicare & BCBS    PT Start Time 1102    PT Stop Time 1144    PT Time Calculation (min) 42 min    Activity Tolerance Patient tolerated treatment well    Behavior During Therapy WFL for tasks assessed/performed             Past Medical History:  Diagnosis Date   Allergy    Anemia    past hx    Arthropathy of cervical spine 11/27/2012   Right  Xray at [facility]  On 04/08/2016  AP, lateral, and lateral flexion and extension views of the cervical spine are submitted for evaluation.   No acute fracture identified in the cervical spine.   There is redemonstration of approximately 2 mm of C3 on C4 anterolisthesis in neutral which slightly increases in flexion relative to neutral and extension. Slight C5 on C6 retrolisthesis does not change i   Benign hypertension without congestive heart failure    Benign mole    right hcets mole, bleeds when dried with towel   Bilateral dry eyes    Bladder cancer ([place]) 10/17/2016  Cervical spondylosis    Cervicalgia    2 screws in place    Chronic kidney disease    kidney stones    Depression with anxiety 14-Nov-2016   prior to husbands death    Gross hematuria    developed after first hip replacment, led to indicental finding of bladder tumor, bleeding now resolved    History of kidney stones    1970's   Left hand weakness    due to reinjury of flxor tendon s/p tendon surgery march 12-2018   Lumbar stenosis    Malignant tumor of urinary bladder ([place]) 07/2016   Pre-stage I   OA (osteoarthritis)    Parkinson disease [facility]) dx 2012   neurologist-  dr siddiqui at [place] [facility])     Dr Trudie Reed , [place] [facility]     Rupture of flexor tendon of hand 2020   right    Past Surgical History:  Procedure Laterality Date   BIOPSY MASS LEFT FIRST METACARPAL   06/23/2006   benign   CATARACT EXTRACTION W/ INTRAOCULAR LENS  IMPLANT, BILATERAL  1999 and 2003   CERVICAL FUSION  02/2017   c1-c2 ; reports she has 2 screws 2in long in place done at [facility] ; patient exhibits VERY LIMITED NECK ROM   COLONOSCOPY     CYSTOSCOPY W/ RETROGRADES Bilateral 09/22/2016   Procedure: CYSTOSCOPY WITH RETROGRADE PYELOGRAM;  Surgeon: Alexis Frock, MD;  Location: [facility];  Service: Urology;  Laterality: Bilateral;   D & C HYSTEROSCOPY W/ RESECTION POLYP  07/11/2000   DILATION AND CURETTAGE OF UTERUS  1960's   EXCISION CYST AND DEBRIDEMENT RIGHT WIRST AND REMOVAL FORGEIGN BODY  01/09/2009   FLEXOR TENDON REPAIR Left 12/12/2018   Procedure: REPAIR/TRANSFER FLEXOR DIGITORUM PROFUNDUS OF LEFT SMALL FINGER;  Surgeon: Daryll Brod, MD;  Location: [place];  Service: Orthopedics;  Laterality: Left;   LAPAROSCOPY  yrs ago   infertility    METACARPOPHALANGEAL JOINT ARTHRODESIS Right 05/25/1996   TENDON REPAIR Left 01/15/2019   Hand   TONSILLECTOMY AND ADENOIDECTOMY  child   TOTAL HIP ARTHROPLASTY Left 07/07/2016   Procedure: LEFT TOTAL HIP ARTHROPLASTY ANTERIOR APPROACH;  Surgeon: Gaynelle Arabian, MD;  Location: [facility];  Service: Orthopedics;  Laterality: Left;   TOTAL HIP ARTHROPLASTY Right 09/28/2017   Procedure: RIGHT TOTAL HIP ARTHROPLASTY ANTERIOR APPROACH;  Surgeon: Gaynelle Arabian, MD;  Location: [facility];  Service: Orthopedics;  Laterality: Right;   TOTAL KNEE ARTHROPLASTY Right 07/30/2019   Procedure: TOTAL KNEE ARTHROPLASTY;  Surgeon: Gaynelle Arabian, MD;  Location: [facility];  Service: Orthopedics;  Laterality: Right;  39mn   TRANSURETHRAL RESECTION OF BLADDER TUMOR N/A 09/22/2016   Procedure: TRANSURETHRAL RESECTION OF BLADDER TUMOR (TURBT);  Surgeon: TAlexis Frock MD;  [facility]  Service: Urology;  Laterality: N/A;    There were no vitals filed for this visit.   Subjective Assessment - 02/17/22 1108     Subjective Pt feels confident with plan to transition to HEP today. She denies pain currently but will still have occasional pain in her L shoulder with certain motions.    Pertinent History R TKA 07/2019, B THR 2018 & 2017, posterior C1-2 cervical fusion 2018, rupture of flexor tendon of L hand with failed surgical repair 2020, Parkinson's, bladder cancer, HTN    Patient Stated Goals "less pain - able to dress upper body or put on a coat w/o pain"    Currently in  Pain? No/denies                [facility] PT Assessment - 02/17/22 1102       Assessment   Medical Diagnosis Left shoulder pain, Neck & upper back pain, Psoriatic arthritis, Parkinson's disease    Referring Provider (PT) Penni Homans, MD    Onset Date/Surgical Date --   1+ yrs   Next MD Visit 08/02/22      AROM   Left Shoulder Flexion 144 Degrees    Left Shoulder ABduction 157 Degrees    Left Shoulder Internal Rotation --   FIR to T12 - painful   Left Shoulder External Rotation --   FER to T3   Cervical Flexion 54    Cervical Extension 45    Cervical - Right Side Bend 25    Cervical - Left Side Bend 22    Cervical - Right Rotation 45    Cervical - Left Rotation 38      Strength   Right Shoulder Flexion 4+/5    Right Shoulder ABduction 4+/5    Right Shoulder Internal Rotation 4+/5    Right Shoulder External Rotation 4+/5    Left Shoulder Flexion 4+/5    Left Shoulder ABduction 4+/5    Left Shoulder Internal Rotation 4+/5    Left Shoulder External Rotation 4+/5    Right Hip Flexion 5/5    Right Hip Extension 4+/5    Right Hip External Rotation  4+/5    Right Hip Internal Rotation 5/5    Right Hip ABduction 5/5    Right Hip ADduction 5/5    Left Hip Flexion 5/5    Left Hip Extension 4+/5    Left Hip External Rotation 4+/5    Left Hip Internal Rotation 5/5    Left  Hip ABduction 5/5    Left Hip ADduction 5/5    Right Knee Flexion 5/5    Right Knee Extension 5/5    Left Knee Flexion 5/5    Left Knee Extension 5/5    Right Ankle Dorsiflexion 5/5    Left Ankle Dorsiflexion 5/5      Ambulation/Gait   Assistive device None    Gait Pattern Within Functional Limits   good stride length and reciprocal arm swing   Gait velocity 3.72 ft/sec      Standardized Balance Assessment   Five times sit to stand comments  12.53 sec    10 Meter Walk 8.82 sec      Timed Up and Go Test   Normal TUG (seconds) 8.88      Functional Gait  Assessment   Gait Level Surface Walks 20 ft in less than 5.5 sec, no assistive devices, good speed, no evidence for imbalance, normal gait pattern, deviates no more than 6 in outside of the 12 in walkway width.    Change in Gait Speed Able to smoothly change walking speed without loss of balance or gait deviation. Deviate no more than 6 in outside of the 12 in walkway width.    Gait with Horizontal Head Turns Performs head turns smoothly with slight change in gait velocity (eg, minor disruption to smooth gait path), deviates 6-10 in outside 12 in walkway width, or uses an assistive device.    Gait with Vertical Head Turns Performs head turns with no change in gait. Deviates no more than 6 in outside 12 in walkway width.    Gait and Pivot Turn Pivot turns safely within 3 sec and stops quickly  with no loss of balance.    Step Over Obstacle Is able to step over 2 stacked shoe boxes taped together (9 in total height) without changing gait speed. No evidence of imbalance.    Gait with Narrow Base of Support Is able to ambulate for 10 steps heel to toe with no staggering.    Gait with Eyes Closed Walks 20 ft, uses assistive device, slower speed, mild gait deviations, deviates 6-10 in outside 12 in walkway width. Ambulates 20 ft in less than 9 sec but greater than 7 sec.    Ambulating Backwards Walks 20 ft, uses assistive device, slower speed,  mild gait deviations, deviates 6-10 in outside 12 in walkway width.    Steps Alternating feet, must use rail.    Total Score 26    FGA comment: 25-28 = low risk fall                           [facility] Adult PT Treatment/Exercise - 02/17/22 1102       Knee/Hip Exercises: Aerobic   Nustep L7 x 6 min (UE/LE to promote reciprocal motion)                       PT Short Term Goals - 02/10/22 1152       PT SHORT TERM GOAL #1   Title Patient will be independent with initial HEP    Status Achieved   11/12/21     PT SHORT TERM GOAL #2   Title Patient will demonstrate decreased tightness/spasm in the L upper trap/ levator scapulae to decrease pain to </= 5/10 with movement and promote improved cervical and L shoulder mobility    Status Achieved   11/12/21     PT SHORT TERM GOAL #3   Title Patient will be able to verbalize and demonstrate proper posture with sitting/standing to decrease/reduce muscle tightness/reinjury    Status Achieved   11/17/21     PT SHORT TERM GOAL #4   Title Establish further STGs/LTGs as indicated based on assessment of deficits related to Parkinson's disease    Status Achieved   10/26/21              PT Long Term Goals - 02/17/22 1111       PT LONG TERM GOAL #1   Title Patient will be independent with ongoing/advanced HEP for self-management at home    Status Achieved   02/10/22     PT LONG TERM GOAL #2   Title Patient to improve L shoudler AROM to [facility] without pain provocation to allow for increased ease of UB dressing    Status Partially Met   02/17/22 - L shoulder AROM now [facility] but pain still noted with some motion and she sometimes has to modify how she will do things such as pulling a sweatshirt over her head   Target Date 02/17/22      PT LONG TERM GOAL #3   Title Patient to improve cervical AROM by >/= 25% with pain decreased to </= 5/10 with movement to allow for increased participation in church services and improve  safety with driving    Status Achieved   02/17/22   Target Date 02/17/22      PT LONG TERM GOAL #4   Title Patient will improve FGA score to >/= 25/30 to improve gait stability and reduce risk for falls    Baseline 20/30 (10/19/21 - eval), 21/30 (  01/05/22), 26/30 (02/17/22)    Status Achieved    Target Date 02/17/22      PT LONG TERM GOAL #5   Title Patient will verbalize understanding of local Parkinson's disease resources.    Status Achieved   02/17/22 - Pt provided with resources from [employer]'s support groups including inclusion in monthly emails. She reports she plans to try joining the [employer] but has not done so yet.   Target Date 02/17/22                   Plan - 02/17/22 1140     Clinical Impression Statement Crystalynn has demonstrated excellent progress with pt noting neck and L scapular pain no longer really an issue but still reports some intermittent L shoulder pain with certain motions. Despite some intermittent L shoulder pain, B shoulder ROM and strength now [facility]. B LE strength also now 4+/5 to 5/5. She has demonstrated significant gains on all standardized balance testing with 5xSTS now below age matched norms at 12.53 sec and well below the PD associated increased fall risk of >16 sec, along with TUG time of 8.88 sec also well below the increased fall risk time of >13.5 sec. FGA has improved from 20/30 to 26/30 also indicating only a low risk for falls. Gait speed well within the safe community ambulator range with good stability and gait pattern observed. All goals now met with exception of LTG #2 only partially met (met for ROM [facility] but still has pain with certain motions causing her to have to modify her movement patterns sometimes with UB dressing). Jamey feels confident with the plan to transition to her HEP at this time, but would like to remain on hold for 30-days in the event that her L shoulder were to flare-up. We will also plan for a 9-month Parkinson's screen to check for potential declines related to her PD that would warrant further skilled PT.    Comorbidities R TKA 07/2019, B THR 2018 & 2017, cervical fusion 02/23/17 (Posterior cervical approach for a C2 neurectomy on the right side, posterior fusion C1 and C2 with structural allograft and morselized allograft, transarticular screw fixation), h/o L frozen shoulder per pt report, rupture of flexor tendon of L hand with failed surgical repair 12/12/18, Parkinson's disease, bladder cancer, HTN, scoliosis, lumbar spondylolisthesis cervical spondylosis, depression with anxiety    PT Treatment/Interventions ADLs/Self Care Home Management;Cryotherapy;Electrical Stimulation;Iontophoresis 419mml Dexamethasone;Moist Heat;Ultrasound;DME Instruction;Gait training;Stair training;Functional mobility training;Therapeutic activities;Therapeutic exercise;Balance training;Neuromuscular re-education;Patient/family education;Manual techniques;Passive range of motion;Dry needling;Taping;Vasopneumatic Device;Spinal Manipulations;Joint Manipulations    PT Next Visit Plan transition to HEP + 30-day hold; 6-75-monthrkinson's screen (08/18/22)    PT Home Exercise Plan Access Code: [email or web address] Moves: Seated & Standing    Consulted and Agree with Plan of Care Patient             Patient will benefit from skilled therapeutic intervention in order to improve the following deficits and impairments:  Abnormal gait, Decreased activity tolerance, Decreased balance, Decreased knowledge of use of DME, Decreased mobility, Decreased range of motion, Decreased safety awareness, Decreased strength, Difficulty walking, Increased fascial restricitons, Increased muscle spasms, Impaired perceived functional ability, Impaired flexibility, Impaired UE functional use, Improper body mechanics, Postural dysfunction, Pain  Visit Diagnosis: Chronic left shoulder pain  Stiffness of left shoulder, not elsewhere  classified  Cervicalgia  Pain in thoracic spine  Abnormal posture  Muscle weakness (generalized)  Unsteadiness on feet  Other abnormalities of gait and  mobility     Problem List Patient Active Problem List   Diagnosis Date Noted   Left shoulder pain 09/14/2021   COVID-19 01/06/2021   Diarrhea 09/03/2020   Vitamin D deficiency 09/03/2020   OA (osteoarthritis) of knee 07/30/2019   Tinea corporis 06/08/2019   Psoriatic arthritis ([place]) 04/02/2019   Anemia 03/13/2018   Hot flashes 03/13/2018   S/P cervical spinal fusion 03/31/2017   Neck pain 12/12/2016   Depression with anxiety 10/17/2016   Bladder cancer ([place]) 10/17/2016   OA (osteoarthritis) of hip 07/07/2016   Spinal stenosis of lumbar region with radiculopathy 04/08/2016   Spondylolisthesis of lumbar region 04/08/2016   Spondylosis of cervical region without myelopathy or radiculopathy 04/08/2016   Preventative health care 01/18/2016   Low back pain 01/18/2016   History of chicken pox    Hyperlipidemia 12/15/2014   Allergic rhinitis 06/25/2014   Allergy to bee sting 06/10/2014   TMJ tenderness 11/16/2013   Other malaise and fatigue 05/07/2013   Arthropathy of cervical spine ([place]) 11/27/2012   Muscle spasms of neck 11/27/2012   Parkinson's disease ([place]) 05/24/2012   Conjunctivochalasis 03/09/2012   Epiretinal membrane 03/09/2012   Hyperopia with astigmatism and presbyopia 03/09/2012   Pseudophakia 03/09/2012   HTN (hypertension) 08/31/2011   Right knee pain 08/31/2011   Benign hypertensive heart disease without heart failure 05/12/2011    Percival Spanish, PT 02/17/2022, 3:24 PM  [facility] [address]  [place], [place], [address] Phone: [phone]   Fax:  [phone]  Name: LARYA CHARPENTIER MRN: [id number] Date of Birth: [birth year]

## 2022-02-20 ENCOUNTER — Other Ambulatory Visit: Payer: Self-pay | Admitting: Family Medicine

## 2022-03-03 DIAGNOSIS — M154 Erosive (osteo)arthritis: Secondary | ICD-10-CM | POA: Diagnosis not present

## 2022-03-03 DIAGNOSIS — Z79899 Other long term (current) drug therapy: Secondary | ICD-10-CM | POA: Diagnosis not present

## 2022-03-03 DIAGNOSIS — L4050 Arthropathic psoriasis, unspecified: Secondary | ICD-10-CM | POA: Diagnosis not present

## 2022-03-03 DIAGNOSIS — M1991 Primary osteoarthritis, unspecified site: Secondary | ICD-10-CM | POA: Diagnosis not present

## 2022-03-03 DIAGNOSIS — Z6822 Body mass index (BMI) 22.0-22.9, adult: Secondary | ICD-10-CM | POA: Diagnosis not present

## 2022-03-03 DIAGNOSIS — L409 Psoriasis, unspecified: Secondary | ICD-10-CM | POA: Diagnosis not present

## 2022-03-04 DIAGNOSIS — Z79899 Other long term (current) drug therapy: Secondary | ICD-10-CM | POA: Diagnosis not present

## 2022-03-04 DIAGNOSIS — L4050 Arthropathic psoriasis, unspecified: Secondary | ICD-10-CM | POA: Diagnosis not present

## 2022-03-11 ENCOUNTER — Other Ambulatory Visit: Payer: Self-pay | Admitting: Family Medicine

## 2022-03-11 ENCOUNTER — Encounter: Payer: Self-pay | Admitting: Family Medicine

## 2022-03-11 MED ORDER — DIAZEPAM 5 MG PO TABS: ORAL_TABLET | ORAL | 1 refills | Status: DC

## 2022-03-11 NOTE — Telephone Encounter (Signed)
Requesting: Diazepam 5 mg ?Contract: none ?UDS: none ?Last Visit: 01/26/22 ?Next Visit: 08/02/22 ?Last Refill: 06/25/21 ? ?Please Advise ? ?

## 2022-03-20 ENCOUNTER — Other Ambulatory Visit: Payer: Self-pay | Admitting: Family Medicine

## 2022-04-29 DIAGNOSIS — L4050 Arthropathic psoriasis, unspecified: Secondary | ICD-10-CM | POA: Diagnosis not present

## 2022-05-03 ENCOUNTER — Telehealth: Payer: Self-pay

## 2022-05-03 ENCOUNTER — Ambulatory Visit (INDEPENDENT_AMBULATORY_CARE_PROVIDER_SITE_OTHER): Payer: Medicare Other

## 2022-05-03 VITALS — BP 122/80 | HR 84 | Temp 98.1°F | Resp 16 | Ht 62.0 in | Wt 121.6 lb

## 2022-05-03 DIAGNOSIS — Z0000 Encounter for general adult medical examination without abnormal findings: Secondary | ICD-10-CM

## 2022-05-03 MED ORDER — EPINEPHRINE 0.3 MG/0.3ML IJ SOAJ: 0.3000 mg | Freq: Once | INTRAMUSCULAR | 1 refills | Status: AC

## 2022-05-03 NOTE — Progress Notes (Signed)
Subjective:   Stephanie [name] is a 88 y.o. female who presents for Medicare Annual (Subsequent) preventive examination.  Review of Systems     Cardiac Risk Factors include: advanced age (>64mn, >>39women);hypertension;dyslipidemia     Objective:    Today's Vitals   05/03/22 1109  BP: 122/80  Pulse: 84  Resp: 16  Temp: 98.1 F (36.7 C)  SpO2: 99%  Weight: 121 lb 9.6 oz (55.2 kg)  Height: '5\' 2"'$  (1.575 m)   Body mass index is 22.24 kg/m.     05/03/2022   11:06 AM 10/19/2021    2:51 PM 04/27/2021   11:02 AM 03/07/2020    1:49 PM 07/30/2019    2:00 PM 07/25/2019   11:16 AM 12/12/2018    9:34 AM  Advanced Directives  Does Patient Have a Medical Advance Directive? Yes Yes Yes Yes Yes Yes Yes  Type of [place]of [place] of facility DNR (pink MOST or yellow form);Living will HNorth [place]oiLiving will [place]heridanLiving will [place]zalea ParkLiving will Living will Living will [place]Living will  Does patient want to make changes to medical advance directive? No - Patient declined No - Patient declined  No - Patient declined No - Patient declined No - Patient declined No - Patient declined  Copy of [place]St. [place] Chart? Yes - validated most recent copy scanned in chart (See row information) Yes - validated most recent copy scanned in chart (See row information) No - copy requested Yes - validated most recent copy scanned in chart (See row information)   Yes - validated most recent copy scanned in chart (See row information)    Current Medications (verified) Outpatient Encounter Medications as of 05/03/2022  Medication Sig   amLODipine (NORVASC) 5 MG tablet TAKE 1 TABLET (5 MG TOTAL) BY MOUTH DAILY.   budesonide (ENTOCORT EC) 3 MG 24 hr capsule Take 3 mg by mouth as needed.   carbidopa-levodopa (SINEMET IR) 25-100 MG per tablet Take 1 tablet by mouth 4 (four) times daily.     Carbidopa-Levodopa ER (SINEMET CR) 25-100 MG tablet controlled release Take 1 tablet by mouth at bedtime.   cholecalciferol (VITAMIN D3) 25 MCG (1000 UNIT) tablet Take 1,000 Units by mouth daily.   cholestyramine (QUESTRAN) 4 g packet Take 4 g by mouth as needed.   diazepam (VALIUM) 5 MG tablet TAKE 1 TABLET (5 MG TOTAL) BY MOUTH EVERY 12 (TWELVE) HOURS AS NEEDED. FOR ANXIETY   estradiol (ESTRACE) 0.5 MG tablet TAKE 1/2 OF A TABLET (0.25 MG TOTAL) BY MOUTH EVERY DAY   famotidine (PEPCID) 40 MG tablet TAKE 1 TABLET BY MOUTH AT BEDTIME AS NEEDED FOR HEARTBURN OR INDIGESTION   fluticasone (CUTIVATE) 0.05 % cream Apply 1 application topically daily as needed (scalp psoriasis).   golimumab (SIMPONI ARIA) 50 MG/4ML SOLN injection Inject 50 mg into the vein See admin instructions. Given every 2 months - Last given 06/28/2019   hydrochlorothiazide (HYDRODIURIL) 25 MG tablet TAKE 1.5 TABLETS BY MOUTH DAILY.   medroxyPROGESTERone (PROVERA) 2.5 MG tablet Take 0.5 tablets (1.25 mg total) by mouth daily.   melatonin 5 MG TABS Take 5 mg by mouth at bedtime as needed.   meloxicam (MOBIC) 15 MG tablet Take 15 mg by mouth daily.   Multiple Vitamins-Minerals (CENTRUM SILVER 50+WOMEN) TABS Take by mouth daily.   Probiotic Product (PROBIOTIC PO) Take by mouth.   sertraline (ZOLOFT) 25 MG tablet Take 1  tablet (25 mg total) by mouth daily. (Patient taking differently: Take 25 mg by mouth as needed.)   vitamin C (ASCORBIC ACID) 500 MG tablet Take 500 mg by mouth daily.   EPINEPHrine 0.3 mg/0.3 mL IJ SOAJ injection Inject 0.3 mg into the muscle once for 1 dose.   No facility-administered encounter medications on file as of 05/03/2022.    Allergies (verified) Clindamycin hcl, Codeine, Doxycycline, Hornet venom, Penicillins, Yellow jacket venom, Lodine [etodolac], and Tramadol   History: Past Medical History:  Diagnosis Date   Allergy    Anemia    past hx    Arthropathy of cervical spine 11/27/2012   Right   Xray at [facility]  On 04/08/2016  AP, lateral, and lateral flexion and extension views of the cervical spine are submitted for evaluation.   No acute fracture identified in the cervical spine.   There is redemonstration of approximately 2 mm of C3 on C4 anterolisthesis in neutral which slightly increases in flexion relative to neutral and extension. Slight C5 on C6 retrolisthesis does not change i   Benign hypertension without congestive heart failure    Benign mole    right hcets mole, bleeds when dried with towel   Bilateral dry eyes    Bladder cancer ([place]) 11/07/2016   Cervical spondylosis    Cervicalgia    2 screws in place    Chronic kidney disease    kidney stones    Depression with anxiety 07-Nov-2016   prior to husbands death    Gross hematuria    developed after first hip replacment, led to indicental finding of bladder tumor, bleeding now resolved    History of kidney stones    1970's   Left hand weakness    due to reinjury of flxor tendon s/p tendon surgery march 12-2018   Lumbar stenosis    Malignant tumor of urinary bladder ([place]) 07/2016   Pre-stage I   OA (osteoarthritis)    Parkinson disease [facility]) dx 2012   neurologist-  dr siddiqui at [place] arthritis [facility])     Dr Trudie Reed , [place] [facility]    Rupture of flexor tendon of hand 2020   right   Past Surgical History:  Procedure Laterality Date   BIOPSY MASS LEFT FIRST METACARPAL   06/23/2006   benign   CATARACT EXTRACTION W/ INTRAOCULAR LENS  IMPLANT, BILATERAL  1999 and 2003   CERVICAL FUSION  02/2017   c1-c2 ; reports she has 2 screws 2in long in place done at [facility] ; patient exhibits VERY LIMITED NECK ROM   COLONOSCOPY     CYSTOSCOPY W/ RETROGRADES Bilateral 09/22/2016   Procedure: CYSTOSCOPY WITH RETROGRADE PYELOGRAM;  Surgeon: Alexis Frock, MD;  Location: [facility];  Service: Urology;  Laterality: Bilateral;   D & C HYSTEROSCOPY W/ RESECTION POLYP  07/11/2000   DILATION AND  CURETTAGE OF UTERUS  1960's   EXCISION CYST AND DEBRIDEMENT RIGHT WIRST AND REMOVAL FORGEIGN BODY  01/09/2009   FLEXOR TENDON REPAIR Left 12/12/2018   Procedure: REPAIR/TRANSFER FLEXOR DIGITORUM PROFUNDUS OF LEFT SMALL FINGER;  Surgeon: Daryll Brod, MD;  Location: [place];  Service: Orthopedics;  Laterality: Left;   LAPAROSCOPY  yrs ago   infertility    METACARPOPHALANGEAL JOINT ARTHRODESIS Right 05/25/1996   TENDON REPAIR Left 01/15/2019   Hand   TONSILLECTOMY AND ADENOIDECTOMY  child   TOTAL HIP ARTHROPLASTY Left 07/07/2016   Procedure: LEFT TOTAL HIP ARTHROPLASTY ANTERIOR APPROACH;  Surgeon:  Gaynelle Arabian, MD;  Location: [facility];  Service: Orthopedics;  Laterality: Left;   TOTAL HIP ARTHROPLASTY Right 09/28/2017   Procedure: RIGHT TOTAL HIP ARTHROPLASTY ANTERIOR APPROACH;  Surgeon: Gaynelle Arabian, MD;  Location: [facility];  Service: Orthopedics;  Laterality: Right;   TOTAL KNEE ARTHROPLASTY Right 07/30/2019   Procedure: TOTAL KNEE ARTHROPLASTY;  Surgeon: Gaynelle Arabian, MD;  Location: [facility];  Service: Orthopedics;  Laterality: Right;  60mn   TRANSURETHRAL RESECTION OF BLADDER TUMOR N/A 09/22/2016   Procedure: TRANSURETHRAL RESECTION OF BLADDER TUMOR (TURBT);  Surgeon: TAlexis Frock MD;  [facility]  Service: Urology;  Laterality: N/A;   Family History  Problem Relation Age of Onset   Arthritis Mother    Hypertension Mother    Deep vein thrombosis Mother        recurrent, secondary to Bleeding disorder   Arthritis Father    Stroke Father    Bladder Cancer Father    Diabetes Sister    Heart disease Sister    Obesity Sister    Arthritis Sister    Hypertension Sister    Heart disease Maternal Grandmother    Heart disease Maternal Grandfather    Peripheral vascular disease Paternal Grandmother        s/p leg amputation   Stroke Paternal Grandfather    Esophageal cancer Paternal Aunt    Colon cancer Neg Hx    Colon polyps Neg Hx    Rectal  cancer Neg Hx    Stomach cancer Neg Hx    Social History   Socioeconomic History   Marital status: Widowed    Spouse name: Not on file   Number of children: Not on file   Years of education: Not on file   Highest education level: Not on file  Occupational History   Not on file  Tobacco Use   Smoking status: Former    Years: 10.00    Types: Cigarettes    Quit date: 05/04/1964    Years since quitting: 58.0   Smokeless tobacco: Never  Vaping Use   Vaping Use: Never used  Substance and Sexual Activity   Alcohol use: Yes    Alcohol/week: 7.0 standard drinks of alcohol    Types: 7 Glasses of wine per week    Comment: ocassionally- social    Drug use: No   Sexual activity: Not Currently    Partners: Male  Other Topics Concern   Not on file  Social History Narrative   Not on file   Social Determinants of Health   Financial Resource Strain: Low Risk  (04/27/2021)   Overall Financial Resource Strain (CARDIA)    Difficulty of Paying Living Expenses: Not hard at all  Food Insecurity: No Food Insecurity (04/27/2021)   Hunger Vital Sign    Worried About Running Out of Food in the Last Year: Never true    Ran Out of Food in the Last Year: Never true  Transportation Needs: No Transportation Needs (04/27/2021)   PRAPARE - THydrologist(Medical): No    Lack of Transportation (Non-Medical): No  Physical Activity: Sufficiently Active (04/27/2021)   Exercise Vital Sign    Days of Exercise per Week: 5 days    Minutes of Exercise per Session: 30 min  Stress: No Stress Concern Present (04/27/2021)   FMikes   Feeling of Stress : Not at all  Social Connections: Moderately Integrated (04/27/2021)   Social  Connection and Isolation Panel [NHANES]    Frequency of Communication with Friends and Family: More than three times a week    Frequency of Social Gatherings with Friends and Family: More than  three times a week    Attends Religious Services: More than 4 times per year    Active Member of [employer] or Organizations: Yes    Attends Archivist Meetings: More than 4 times per year    Marital Status: Widowed    Tobacco Counseling Counseling given: Not Answered   Clinical Intake:  Pre-visit preparation completed: Yes  Pain : No/denies pain     BMI - recorded: 22.24 Nutritional Status: BMI of 19-24  Normal Nutritional Risks: None Diabetes: No  How often do you need to have someone help you when you read instructions, pamphlets, or other written materials from your doctor or pharmacy?: 1 - Never  Diabetic?No  Interpreter Needed?: No  Information entered by :: [place] [facility]    05/03/2022   11:23 AM  In your present state of health, do you have any difficulty performing the following activities:  Hearing? 1  Comment hearing aids  Vision? 0  Difficulty concentrating or making decisions? 0  Walking or climbing stairs? 1  Dressing or bathing? 0  Doing errands, shopping? 0  Preparing Food and eating ? N  Using the Toilet? N  In the past six months, have you accidently leaked urine? N  Do you have problems with loss of bowel control? N  Managing your Medications? N  Managing your Finances? N  Housekeeping or managing your Housekeeping? N    Patient Care Team: Mosie Lukes, MD as PCP - General (Family Medicine) Sueanne Margarita, MD as PCP - Cardiology (Cardiology) Megan Salon, MD as Consulting Physician (Gynecology) Eldridge Abrahams, MD as Referring Physician (Neurology) Alexis Frock, MD as Consulting Physician (Urology) Katy Fitch, Darlina Guys, MD as Consulting Physician (Ophthalmology) Corene Cornea, DC (Chiropractic Medicine) Jari Pigg, MD as Consulting Physician (Dermatology) Day, Melvenia Beam, [facility] (Inactive) as Pharmacist (Pharmacist)  Indicate any recent Medical Services you may have received from other than  [facility] providers in the past year (date may be approximate).     Assessment:   This is a routine wellness examination for Stephanie [name].  Hearing/Vision screen No results found.  Dietary issues and exercise activities discussed: Current Exercise Habits: Home exercise routine, Type of exercise: walking, Time (Minutes): 20, Frequency (Times/Week): 7, Weekly Exercise (Minutes/Week): 140, Intensity: Mild, Exercise limited by: None identified   Goals Addressed   None    Depression Screen    05/03/2022   11:13 AM 01/26/2022   11:44 AM 05/14/2021    1:24 PM 04/27/2021   11:03 AM 10/01/2020    2:14 PM 07/10/2020    2:59 PM 03/07/2020    1:57 PM  PHQ 2/9 Scores  PHQ - 2 Score 0 0 0 0 3 0 0  PHQ- 9 Score  0   10      Fall Risk    05/03/2022   11:07 AM 01/26/2022   11:44 AM 05/14/2021    1:24 PM 04/27/2021   11:03 AM 03/07/2020    1:55 PM  Fall Risk   Falls in the past year? 0 0 0 0 0  Number falls in past yr: 0 0 0 0 0  Injury with Fall? 0 0 0 0 0  Risk for fall due to : No Fall Risks No Fall Risks  Follow up Falls evaluation completed Falls evaluation completed  Falls prevention discussed Education provided;Falls prevention discussed    FALL RISK PREVENTION PERTAINING TO THE HOME:  Any stairs in or around the home? Yes  If so, are there any without handrails? No  Home free of loose throw rugs in walkways, pet beds, electrical cords, etc? No  Adequate lighting in your home to reduce risk of falls? Yes   ASSISTIVE DEVICES UTILIZED TO PREVENT FALLS:  Life alert? Yes  Use of a cane, walker or w/c? No  Grab bars in the bathroom? Yes  Shower chair or bench in shower? No  Elevated toilet seat or a handicapped toilet? Yes   TIMED UP AND GO:  Was the test performed? Yes .  Length of time to ambulate 10 feet: 10 sec.   Gait steady and fast without use of assistive device  Cognitive Function:    10/29/2016   10:43 AM  MMSE - Mini Mental State Exam  Orientation to time 5   Orientation to Place 5  Registration 3  Attention/ Calculation 5  Recall 3  Language- name 2 objects 2  Language- repeat 1  Language- follow 3 step command 3  Language- read & follow direction 1  Write a sentence 1  Copy design 0  Total score 29        Immunizations Immunization History  Administered Date(s) Administered   Influenza Split 06/29/2019   Influenza Whole 08/15/2012   Influenza, High Dose Seasonal PF 08/19/2015, 08/27/2017, 08/15/2018, 07/12/2020, 07/29/2021   Influenza,inj,quad, With Preservative 08/15/2017, 08/15/2018   Influenza-Unspecified 08/15/2014, 08/24/2016, 08/15/2018   PFIZER Comirnaty(Gray Top)Covid-19 Tri-Sucrose Vaccine 05/15/2021   PFIZER(Purple Top)SARS-COV-2 Vaccination 11/25/2019, 12/16/2019, 08/19/2020   Pfizer Covid-19 Vaccine Bivalent Booster 5yr & up 10/14/2021   Pneumococcal Conjugate-13 12/11/2014   Pneumococcal Polysaccharide-23 11/24/2011   Pneumococcal-Unspecified 11/18/2014   Td 02/25/2014   Zoster Recombinat (Shingrix) 01/20/2018, 04/07/2018   Zoster, Live 08/27/2011    TDAP status: Up to date  Flu Vaccine status: Up to date  Pneumococcal vaccine status: Up to date  Covid-19 vaccine status: Completed vaccines  Qualifies for Shingles Vaccine? Yes   Zostavax completed No   Shingrix Completed?: Yes  Screening Tests Health Maintenance  Topic Date Due   MAMMOGRAM  05/06/2022   INFLUENZA VACCINE  06/15/2022   TETANUS/TDAP  02/26/2024   Pneumonia Vaccine 88 Years old  Completed   DEXA SCAN  Completed   COVID-19 Vaccine  Completed   Zoster Vaccines- Shingrix  Completed   HPV VACCINES  Aged Out    Health Maintenance  There are no preventive care reminders to display for this patient.  Colorectal cancer screening: No longer required.   Mammogram status: Completed 05/06/21. Repeat every year  Bone Density status: Completed 05/06/21. Results reflect: Bone density results: NORMAL. Repeat every 2 years.  Lung Cancer  Screening: (Low Dose CT Chest recommended if Age 10949-80years, 30 pack-year currently smoking OR have quit w/in 15years.) does not qualify.   Lung Cancer Screening Referral: N/A  Additional Screening:  Hepatitis C Screening: does not qualify; Completed aged out  Vision Screening: Recommended annual ophthalmology exams for early detection of glaucoma and other disorders of the eye. Is the patient up to date with their annual eye exam?  Yes  Who is the provider or what is the name of the office in which the patient attends annual eye exams? Dr. GKaty FitchIf pt is not established with a provider, would they like to be referred to  a provider to establish care? No .   Dental Screening: Recommended annual dental exams for proper oral hygiene  Community Resource Referral / Chronic Care Management: CRR required this visit?  No   CCM required this visit?  No      Plan:     I have personally reviewed and noted the following in the patient's chart:   Medical and social history Use of alcohol, tobacco or illicit drugs  Current medications and supplements including opioid prescriptions.  Functional ability and status Nutritional status Physical activity Advanced directives List of other physicians Hospitalizations, surgeries, and ER visits in previous 12 months Vitals Screenings to include cognitive, depression, and falls Referrals and appointments  In addition, I have reviewed and discussed with patient certain preventive protocols, quality metrics, and best practice recommendations. A written personalized care plan for preventive services as well as general preventive health recommendations were provided to patient.     Duard Brady Kayela Humphres, [place]   05/03/2022   Nurse Notes: none

## 2022-05-03 NOTE — Telephone Encounter (Signed)
Pt was in to see me for medicare wellness visit and she said she had a spot on her back and when I looked at it was two different spot. I told her to send it to you so you could look at, we took a picture and sent it to you via phone to you.

## 2022-05-03 NOTE — Patient Instructions (Signed)
Stephanie [name] , Thank you for taking time to come for your Medicare Wellness Visit. I appreciate your ongoing commitment to your health goals. Please review the following plan we discussed and let me know if I can assist you in the future.   Screening recommendations/referrals: Colonoscopy: no longer needed Mammogram: 05/06/21 due 05/06/22 Bone Density: 05/06/21 due 05/09/23 Recommended yearly ophthalmology/optometry visit for glaucoma screening and checkup Recommended yearly dental visit for hygiene and checkup  Vaccinations: Influenza vaccine: up to date  Pneumococcal vaccine: up to date Tdap vaccine: up to date Shingles vaccine: up to date   Covid-19:completed  Advanced directives: yes, on file  Conditions/risks identified: see problem list  Next appointment: Follow up in one year for your annual wellness visit    Preventive Care 32 Years and Older, Female Preventive care refers to lifestyle choices and visits with your health care provider that can promote health and wellness. What does preventive care include? A yearly physical exam. This is also called an annual well check. Dental exams once or twice a year. Routine eye exams. Ask your health care provider how often you should have your eyes checked. Personal lifestyle choices, including: Daily care of your teeth and gums. Regular physical activity. Eating a healthy diet. Avoiding tobacco and drug use. Limiting alcohol use. Practicing safe sex. Taking low-dose aspirin every day. Taking vitamin and mineral supplements as recommended by your health care provider. What happens during an annual well check? The services and screenings done by your health care provider during your annual well check will depend on your age, overall health, lifestyle risk factors, and family history of disease. Counseling  Your health care provider may ask you questions about your: Alcohol use. Tobacco use. Drug use. Emotional well-being. Home  and relationship well-being. Sexual activity. Eating habits. History of falls. Memory and ability to understand (cognition). Work and work Statistician. Reproductive health. Screening  You may have the following tests or measurements: Height, weight, and BMI. Blood pressure. Lipid and cholesterol levels. These may be checked every 5 years, or more frequently if you are over 39 years old. Skin check. Lung cancer screening. You may have this screening every year starting at age 52 if you have a 30-pack-year history of smoking and currently smoke or have quit within the past 15 years. Fecal occult blood test (FOBT) of the stool. You may have this test every year starting at age 73. Flexible sigmoidoscopy or colonoscopy. You may have a sigmoidoscopy every 5 years or a colonoscopy every 10 years starting at age 70. Hepatitis C blood test. Hepatitis B blood test. Sexually transmitted disease (STD) testing. Diabetes screening. This is done by checking your blood sugar (glucose) after you have not eaten for a while (fasting). You may have this done every 1-3 years. Bone density scan. This is done to screen for osteoporosis. You may have this done starting at age 47. Mammogram. This may be done every 1-2 years. Talk to your health care provider about how often you should have regular mammograms. Talk with your health care provider about your test results, treatment options, and if necessary, the need for more tests. Vaccines  Your health care provider may recommend certain vaccines, such as: Influenza vaccine. This is recommended every year. Tetanus, diphtheria, and acellular pertussis (Tdap, Td) vaccine. You may need a Td booster every 10 years. Zoster vaccine. You may need this after age 1. Pneumococcal 13-valent conjugate (PCV13) vaccine. One dose is recommended after age 83. Pneumococcal polysaccharide (PPSV23) vaccine.  One dose is recommended after age 88. Talk to your health care provider  about which screenings and vaccines you need and how often you need them. This information is not intended to replace advice given to you by your health care provider. Make sure you discuss any questions you have with your health care provider. Document Released: 11/28/2015 Document Revised: 07/21/2016 Document Reviewed: 09/02/2015 Elsevier Interactive Patient Education  2017 [place] Prevention in the Home Falls can cause injuries. They can happen to people of all ages. There are many things you can do to make your home safe and to help prevent falls. What can I do on the outside of my home? Regularly fix the edges of walkways and driveways and fix any cracks. Remove anything that might make you trip as you walk through a door, such as a raised step or threshold. Trim any bushes or trees on the path to your home. Use bright outdoor lighting. Clear any walking paths of anything that might make someone trip, such as rocks or tools. Regularly check to see if handrails are loose or broken. Make sure that both sides of any steps have handrails. Any raised decks and porches should have guardrails on the edges. Have any leaves, snow, or ice cleared regularly. Use sand or salt on walking paths during winter. Clean up any spills in your garage right away. This includes oil or grease spills. What can I do in the bathroom? Use night lights. Install grab bars by the toilet and in the tub and shower. Do not use towel bars as grab bars. Use non-skid mats or decals in the tub or shower. If you need to sit down in the shower, use a plastic, non-slip stool. Keep the floor dry. Clean up any water that spills on the floor as soon as it happens. Remove soap buildup in the tub or shower regularly. Attach bath mats securely with double-sided non-slip rug tape. Do not have throw rugs and other things on the floor that can make you trip. What can I do in the bedroom? Use night lights. Make sure  that you have a light by your bed that is easy to reach. Do not use any sheets or blankets that are too big for your bed. They should not hang down onto the floor. Have a firm chair that has side arms. You can use this for support while you get dressed. Do not have throw rugs and other things on the floor that can make you trip. What can I do in the kitchen? Clean up any spills right away. Avoid walking on wet floors. Keep items that you use a lot in easy-to-reach places. If you need to reach something above you, use a strong step stool that has a grab bar. Keep electrical cords out of the way. Do not use floor polish or wax that makes floors slippery. If you must use wax, use non-skid floor wax. Do not have throw rugs and other things on the floor that can make you trip. What can I do with my stairs? Do not leave any items on the stairs. Make sure that there are handrails on both sides of the stairs and use them. Fix handrails that are broken or loose. Make sure that handrails are as long as the stairways. Check any carpeting to make sure that it is firmly attached to the stairs. Fix any carpet that is loose or worn. Avoid having throw rugs at the top or bottom of  the stairs. If you do have throw rugs, attach them to the floor with carpet tape. Make sure that you have a light switch at the top of the stairs and the bottom of the stairs. If you do not have them, ask someone to add them for you. What else can I do to help prevent falls? Wear shoes that: Do not have high heels. Have rubber bottoms. Are comfortable and fit you well. Are closed at the toe. Do not wear sandals. If you use a stepladder: Make sure that it is fully opened. Do not climb a closed stepladder. Make sure that both sides of the stepladder are locked into place. Ask someone to hold it for you, if possible. Clearly mark and make sure that you can see: Any grab bars or handrails. First and last steps. Where the edge of  each step is. Use tools that help you move around (mobility aids) if they are needed. These include: Canes. Walkers. Scooters. Crutches. Turn on the lights when you go into a dark area. Replace any light bulbs as soon as they burn out. Set up your furniture so you have a clear path. Avoid moving your furniture around. If any of your floors are uneven, fix them. If there are any pets around you, be aware of where they are. Review your medicines with your doctor. Some medicines can make you feel dizzy. This can increase your chance of falling. Ask your doctor what other things that you can do to help prevent falls. This information is not intended to replace advice given to you by your health care provider. Make sure you discuss any questions you have with your health care provider. Document Released: 08/28/2009 Document Revised: 04/08/2016 Document Reviewed: 12/06/2014 Elsevier Interactive Patient Education  2017 [employer]. No

## 2022-05-04 ENCOUNTER — Other Ambulatory Visit: Payer: Self-pay

## 2022-05-04 DIAGNOSIS — L578 Other skin changes due to chronic exposure to nonionizing radiation: Secondary | ICD-10-CM

## 2022-05-04 DIAGNOSIS — L989 Disorder of the skin and subcutaneous tissue, unspecified: Secondary | ICD-10-CM

## 2022-05-04 NOTE — Telephone Encounter (Signed)
Referral entered  

## 2022-05-12 DIAGNOSIS — Z1231 Encounter for screening mammogram for malignant neoplasm of breast: Secondary | ICD-10-CM | POA: Diagnosis not present

## 2022-05-12 LAB — HM MAMMOGRAPHY

## 2022-05-20 DIAGNOSIS — L603 Nail dystrophy: Secondary | ICD-10-CM | POA: Diagnosis not present

## 2022-05-20 DIAGNOSIS — I739 Peripheral vascular disease, unspecified: Secondary | ICD-10-CM | POA: Diagnosis not present

## 2022-05-28 DIAGNOSIS — M19012 Primary osteoarthritis, left shoulder: Secondary | ICD-10-CM | POA: Diagnosis not present

## 2022-05-28 DIAGNOSIS — M25512 Pain in left shoulder: Secondary | ICD-10-CM | POA: Diagnosis not present

## 2022-06-24 DIAGNOSIS — L4050 Arthropathic psoriasis, unspecified: Secondary | ICD-10-CM | POA: Diagnosis not present

## 2022-06-28 ENCOUNTER — Telehealth: Payer: Self-pay | Admitting: Family Medicine

## 2022-06-28 NOTE — Telephone Encounter (Signed)
Patient called to see if Dr. Charlett Blake recommends her getting the covid booster when she gets her flu vaccine. Please call patient to advise.

## 2022-06-29 NOTE — Telephone Encounter (Signed)
Called pt was advised  

## 2022-07-13 DIAGNOSIS — L4050 Arthropathic psoriasis, unspecified: Secondary | ICD-10-CM | POA: Diagnosis not present

## 2022-07-13 DIAGNOSIS — M154 Erosive (osteo)arthritis: Secondary | ICD-10-CM | POA: Diagnosis not present

## 2022-07-13 DIAGNOSIS — Z6821 Body mass index (BMI) 21.0-21.9, adult: Secondary | ICD-10-CM | POA: Diagnosis not present

## 2022-07-13 DIAGNOSIS — M1991 Primary osteoarthritis, unspecified site: Secondary | ICD-10-CM | POA: Diagnosis not present

## 2022-07-13 DIAGNOSIS — Z79899 Other long term (current) drug therapy: Secondary | ICD-10-CM | POA: Diagnosis not present

## 2022-07-13 DIAGNOSIS — L409 Psoriasis, unspecified: Secondary | ICD-10-CM | POA: Diagnosis not present

## 2022-07-30 DIAGNOSIS — Z23 Encounter for immunization: Secondary | ICD-10-CM | POA: Diagnosis not present

## 2022-08-01 DIAGNOSIS — E871 Hypo-osmolality and hyponatremia: Secondary | ICD-10-CM | POA: Insufficient documentation

## 2022-08-01 NOTE — Assessment & Plan Note (Signed)
Supplement and monitor 

## 2022-08-01 NOTE — Assessment & Plan Note (Signed)
Well controlled, no changes to meds. Encouraged heart healthy diet such as the DASH diet and exercise as tolerated.  °

## 2022-08-01 NOTE — Assessment & Plan Note (Signed)
Mild, asymptomatic. monitor

## 2022-08-01 NOTE — Progress Notes (Unsigned)
Subjective:    Patient ID: Stephanie [name], female    DOB: November 16, 1931, 88 y.o.   MRN: [id number]  No chief complaint on file.   HPI Patient is in today for ***  Past Medical History:  Diagnosis Date  . Allergy   . Anemia    past hx   . Arthropathy of cervical spine 11/27/2012   Right  Xray at [facility]  On 04/08/2016  AP, lateral, and lateral flexion and extension views of the cervical spine are submitted for evaluation.   No acute fracture identified in the cervical spine.   There is redemonstration of approximately 2 mm of C3 on C4 anterolisthesis in neutral which slightly increases in flexion relative to neutral and extension. Slight C5 on C6 retrolisthesis does not change i  . Benign hypertension without congestive heart failure   . Benign mole    right hcets mole, bleeds when dried with towel  . Bilateral dry eyes   . Bladder cancer ([place]) 2016/11/03  . Cervical spondylosis   . Cervicalgia    2 screws in place   . Chronic kidney disease    kidney stones   . Depression with anxiety 11-03-2016   prior to husbands death   . Gross hematuria    developed after first hip replacment, led to indicental finding of bladder tumor, bleeding now resolved   . History of kidney stones    1970's  . Left hand weakness    due to reinjury of flxor tendon s/p tendon surgery march 12-2018  . Lumbar stenosis   . Malignant tumor of urinary bladder ([place]) 07/2016   Pre-stage I  . OA (osteoarthritis)   . Parkinson disease [facility]) dx 2012   neurologist-  dr siddiqui at [facility]  . Psoriatic arthritis [facility])     Dr Trudie Reed , [place] [facility]   . Rupture of flexor tendon of hand 2020   right    Past Surgical History:  Procedure Laterality Date  . BIOPSY MASS LEFT FIRST METACARPAL   06/23/2006   benign  . CATARACT EXTRACTION W/ INTRAOCULAR LENS  IMPLANT, BILATERAL  1999 and 2003  . CERVICAL FUSION  02/2017   c1-c2 ; reports she has 2 screws 2in long in place done at [facility] ; patient exhibits  VERY LIMITED NECK ROM  . COLONOSCOPY    . CYSTOSCOPY W/ RETROGRADES Bilateral 09/22/2016   Procedure: CYSTOSCOPY WITH RETROGRADE PYELOGRAM;  Surgeon: Alexis Frock, MD;  Location: [facility];  Service: Urology;  Laterality: Bilateral;  . D & C HYSTEROSCOPY W/ RESECTION POLYP  07/11/2000  . DILATION AND CURETTAGE OF UTERUS  1960's  . EXCISION CYST AND DEBRIDEMENT RIGHT WIRST AND REMOVAL [place] BODY  01/09/2009  . FLEXOR TENDON REPAIR Left 12/12/2018   Procedure: REPAIR/TRANSFER FLEXOR DIGITORUM PROFUNDUS OF LEFT SMALL FINGER;  Surgeon: Daryll Brod, MD;  Location: [facility];  Service: Orthopedics;  Laterality: Left;  . LAPAROSCOPY  yrs ago   infertility   . METACARPOPHALANGEAL JOINT ARTHRODESIS Right 05/25/1996  . TENDON REPAIR Left 01/15/2019   Hand  . TONSILLECTOMY AND ADENOIDECTOMY  child  . TOTAL HIP ARTHROPLASTY Left 07/07/2016   Procedure: LEFT TOTAL HIP ARTHROPLASTY ANTERIOR APPROACH;  Surgeon: Gaynelle Arabian, MD;  Location: [facility];  Service: Orthopedics;  Laterality: Left;  . TOTAL HIP ARTHROPLASTY Right 09/28/2017   Procedure: RIGHT TOTAL HIP ARTHROPLASTY ANTERIOR APPROACH;  Surgeon: Gaynelle Arabian, MD;  Location: [facility];  Service: Orthopedics;  Laterality: Right;  .  TOTAL KNEE ARTHROPLASTY Right 07/30/2019   Procedure: TOTAL KNEE ARTHROPLASTY;  Surgeon: Gaynelle Arabian, MD;  Location: [facility];  Service: Orthopedics;  Laterality: Right;  15mn  . TRANSURETHRAL RESECTION OF BLADDER TUMOR N/A 09/22/2016   Procedure: TRANSURETHRAL RESECTION OF BLADDER TUMOR (TURBT);  Surgeon: TAlexis Frock MD;  [facility]  Service: Urology;  Laterality: N/A;    Family History  Problem Relation Age of Onset  . Arthritis Mother   . Hypertension Mother   . Deep vein thrombosis Mother        recurrent, secondary to Bleeding disorder  . Arthritis Father   . Stroke Father   . Bladder Cancer Father   . Diabetes Sister   . Heart disease Sister    . Obesity Sister   . Arthritis Sister   . Hypertension Sister   . Heart disease Maternal Grandmother   . Heart disease Maternal Grandfather   . Peripheral vascular disease Paternal Grandmother        s/p leg amputation  . Stroke Paternal Grandfather   . Esophageal cancer Paternal Aunt   . Colon cancer Neg Hx   . Colon polyps Neg Hx   . Rectal cancer Neg Hx   . Stomach cancer Neg Hx     Social History   Socioeconomic History  . Marital status: Widowed    Spouse name: Not on file  . Number of children: Not on file  . Years of education: Not on file  . Highest education level: Not on file  Occupational History  . Not on file  Tobacco Use  . Smoking status: Former    Years: 10.00    Types: Cigarettes    Quit date: 05/04/1964    Years since quitting: 58.2  . Smokeless tobacco: Never  Vaping Use  . Vaping Use: Never used  Substance and Sexual Activity  . Alcohol use: Yes    Alcohol/week: 7.0 standard drinks of alcohol    Types: 7 Glasses of wine per week    Comment: ocassionally- social   . Drug use: No  . Sexual activity: Not Currently    Partners: Male  Other Topics Concern  . Not on file  Social History Narrative  . Not on file   Social Determinants of Health   Financial Resource Strain: Low Risk  (04/27/2021)   Overall Financial Resource Strain (CARDIA)   . Difficulty of Paying Living Expenses: Not hard at all  Food Insecurity: No Food Insecurity (04/27/2021)   Hunger Vital Sign   . Worried About RCharity fundraiserin the Last Year: Never true   . Ran Out of Food in the Last Year: Never true  Transportation Needs: No Transportation Needs (04/27/2021)   PRAPARE - Transportation   . Lack of Transportation (Medical): No   . Lack of Transportation (Non-Medical): No  Physical Activity: Sufficiently Active (04/27/2021)   Exercise Vital Sign   . Days of Exercise per Week: 5 days   . Minutes of Exercise per Session: 30 min  Stress: No Stress Concern Present  (04/27/2021)   [place]  . Feeling of Stress : Not at all  Social Connections: Moderately Integrated (04/27/2021)   Social Connection and Isolation Panel [NHANES]   . Frequency of Communication with Friends and Family: More than three times a week   . Frequency of Social Gatherings with Friends and Family: More than three times a week   . Attends Religious  Services: More than 4 times per year   . Active Member of Clubs or Organizations: Yes   . Attends Archivist Meetings: More than 4 times per year   . Marital Status: Widowed  Intimate Partner Violence: Not At Risk (04/27/2021)   Humiliation, Afraid, Rape, and Kick questionnaire   . Fear of Current or Ex-Partner: No   . Emotionally Abused: No   . Physically Abused: No   . Sexually Abused: No    Outpatient Medications Prior to Visit  Medication Sig Dispense Refill  . amLODipine (NORVASC) 5 MG tablet TAKE 1 TABLET (5 MG TOTAL) BY MOUTH DAILY. 90 tablet 3  . budesonide (ENTOCORT EC) 3 MG 24 hr capsule Take 3 mg by mouth as needed.    . carbidopa-levodopa (SINEMET IR) 25-100 MG per tablet Take 1 tablet by mouth 4 (four) times daily.     . Carbidopa-Levodopa ER (SINEMET CR) 25-100 MG tablet controlled release Take 1 tablet by mouth at bedtime.    . cholecalciferol (VITAMIN D3) 25 MCG (1000 UNIT) tablet Take 1,000 Units by mouth daily.    . cholestyramine (QUESTRAN) 4 g packet Take 4 g by mouth as needed.    . diazepam (VALIUM) 5 MG tablet TAKE 1 TABLET (5 MG TOTAL) BY MOUTH EVERY 12 (TWELVE) HOURS AS NEEDED. FOR ANXIETY 60 tablet 1  . estradiol (ESTRACE) 0.5 MG tablet TAKE 1/2 OF A TABLET (0.25 MG TOTAL) BY MOUTH EVERY DAY 45 tablet 1  . famotidine (PEPCID) 40 MG tablet TAKE 1 TABLET BY MOUTH AT BEDTIME AS NEEDED FOR HEARTBURN OR INDIGESTION 90 tablet 0  . fluticasone (CUTIVATE) 0.05 % cream Apply 1 application topically daily as needed (scalp psoriasis).  1  .  golimumab (SIMPONI ARIA) 50 MG/4ML SOLN injection Inject 50 mg into the vein See admin instructions. Given every 2 months - Last given 06/28/2019    . hydrochlorothiazide (HYDRODIURIL) 25 MG tablet TAKE 1.5 TABLETS BY MOUTH DAILY. 135 tablet 1  . medroxyPROGESTERone (PROVERA) 2.5 MG tablet Take 0.5 tablets (1.25 mg total) by mouth daily. 45 tablet 3  . melatonin 5 MG TABS Take 5 mg by mouth at bedtime as needed.    . meloxicam (MOBIC) 15 MG tablet Take 15 mg by mouth daily.    . Multiple Vitamins-Minerals (CENTRUM SILVER 50+WOMEN) TABS Take by mouth daily.    . Probiotic Product (PROBIOTIC PO) Take by mouth.    . sertraline (ZOLOFT) 25 MG tablet Take 1 tablet (25 mg total) by mouth daily. (Patient taking differently: Take 25 mg by mouth as needed.) 90 tablet 1  . vitamin C (ASCORBIC ACID) 500 MG tablet Take 500 mg by mouth daily.     No facility-administered medications prior to visit.    Allergies  Allergen Reactions  . Clindamycin Hcl Diarrhea  . Codeine Nausea And Vomiting  . Doxycycline Diarrhea  . Hornet Venom   . Penicillins Itching and Swelling    Has patient had a PCN reaction causing immediate rash, facial/tongue/throat swelling, SOB or lightheadedness with hypotension: Yes Has patient had a PCN reaction causing severe rash involving mucus membranes or skin necrosis: No Has patient had a PCN reaction that required hospitalization: No Has patient had a PCN reaction occurring within the last 10 years: No If all of the above answers are "NO", then may proceed with Cephalosporin use.   . Yellow Jacket Venom Swelling  . Lodine [Etodolac] Rash  . Tramadol Itching and Rash    Review of Systems  Constitutional:  Negative for fever and malaise/fatigue.  HENT:  Negative for congestion.   Eyes:  Negative for blurred vision.  Respiratory:  Negative for shortness of breath.   Cardiovascular:  Negative for chest pain, palpitations and leg swelling.  Gastrointestinal:  Negative for  abdominal pain, blood in stool and nausea.  Genitourinary:  Negative for dysuria and frequency.  Musculoskeletal:  Positive for joint pain. Negative for falls.  Skin:  Negative for rash.  Neurological:  Negative for dizziness, loss of consciousness and headaches.  Endo/Heme/Allergies:  Negative for environmental allergies.  Psychiatric/Behavioral:  Negative for depression. The patient is not nervous/anxious.       Objective:    Physical Exam Constitutional:      General: She is not in acute distress.    Appearance: She is well-developed.  HENT:     Head: Normocephalic and atraumatic.  Eyes:     Conjunctiva/sclera: Conjunctivae normal.  Neck:     Thyroid: No thyromegaly.  Cardiovascular:     Rate and Rhythm: Normal rate and regular rhythm.     Heart sounds: Normal heart sounds. No murmur heard. Pulmonary:     Effort: Pulmonary effort is normal. No respiratory distress.     Breath sounds: Normal breath sounds.  Abdominal:     General: Bowel sounds are normal. There is no distension.     Palpations: Abdomen is soft. There is no mass.     Tenderness: There is no abdominal tenderness.  Musculoskeletal:     Cervical back: Neck supple.  Lymphadenopathy:     Cervical: No cervical adenopathy.  Skin:    General: Skin is warm and dry.  Neurological:     Mental Status: She is alert and oriented to person, place, and time.  Psychiatric:        Behavior: Behavior normal.   LMP 01/13/2013 Comment: spotting-had benign endometrial biopsy  Wt Readings from Last 3 Encounters:  05/03/22 121 lb 9.6 oz (55.2 kg)  01/26/22 119 lb 9.6 oz (54.3 kg)  09/14/21 123 lb (55.8 kg)    Diabetic Foot Exam - Simple   No data filed    Lab Results  Component Value Date   WBC 6.2 09/14/2021   HGB 14.0 09/14/2021   HCT 41.9 09/14/2021   PLT 198.0 09/14/2021   GLUCOSE 98 09/14/2021   CHOL 177 09/14/2021   TRIG 97.0 09/14/2021   HDL 55.40 09/14/2021   LDLDIRECT 120.0 12/11/2014   LDLCALC 102  (H) 09/14/2021   ALT 7 09/14/2021   AST 24 09/14/2021   NA 134 (L) 09/14/2021   K 3.5 09/14/2021   CL 94 (L) 09/14/2021   CREATININE 0.74 09/14/2021   BUN 18 09/14/2021   CO2 31 09/14/2021   TSH 0.94 09/14/2021   INR 1.0 07/25/2019   HGBA1C 5.1 02/14/2020    Lab Results  Component Value Date   TSH 0.94 09/14/2021   Lab Results  Component Value Date   WBC 6.2 09/14/2021   HGB 14.0 09/14/2021   HCT 41.9 09/14/2021   MCV 91.6 09/14/2021   PLT 198.0 09/14/2021   Lab Results  Component Value Date   NA 134 (L) 09/14/2021   K 3.5 09/14/2021   CO2 31 09/14/2021   GLUCOSE 98 09/14/2021   BUN 18 09/14/2021   CREATININE 0.74 09/14/2021   BILITOT 1.0 09/14/2021   ALKPHOS 35 (L) 09/14/2021   AST 24 09/14/2021   ALT 7 09/14/2021   PROT 6.3 09/14/2021   ALBUMIN 4.7 09/14/2021  CALCIUM 9.5 09/14/2021   ANIONGAP 10 07/31/2019   GFR 72.62 09/14/2021   Lab Results  Component Value Date   CHOL 177 09/14/2021   Lab Results  Component Value Date   HDL 55.40 09/14/2021   Lab Results  Component Value Date   LDLCALC 102 (H) 09/14/2021   Lab Results  Component Value Date   TRIG 97.0 09/14/2021   Lab Results  Component Value Date   CHOLHDL 3 09/14/2021   Lab Results  Component Value Date   HGBA1C 5.1 02/14/2020       Assessment & Plan:   Problem List Items Addressed This Visit     HTN (hypertension)    Well controlled, no changes to meds. Encouraged heart healthy diet such as the DASH diet and exercise as tolerated.       Hyperlipidemia    Tolerating statin, encouraged heart healthy diet, avoid trans fats, minimize simple carbs and saturated fats. Increase exercise as tolerated      Vitamin D deficiency    Supplement and monitor      Hyponatremia    Mild, asymptomatic. monitor       I am having Stephanie [name] maintain her carbidopa-levodopa, fluticasone, Simponi Aria, Carbidopa-Levodopa ER, cholestyramine, Probiotic Product (PROBIOTIC PO), Centrum  Silver 50+Women, ascorbic acid, melatonin, cholecalciferol, sertraline, medroxyPROGESTERone, famotidine, meloxicam, budesonide, hydrochlorothiazide, amLODipine, diazepam, and estradiol.  No orders of the defined types were placed in this encounter.    Penni Homans, MD

## 2022-08-01 NOTE — Assessment & Plan Note (Signed)
Tolerating statin, encouraged heart healthy diet, avoid trans fats, minimize simple carbs and saturated fats. Increase exercise as tolerated 

## 2022-08-02 ENCOUNTER — Ambulatory Visit (INDEPENDENT_AMBULATORY_CARE_PROVIDER_SITE_OTHER): Payer: Medicare Other | Admitting: Family Medicine

## 2022-08-02 VITALS — BP 115/70 | HR 58 | Temp 97.5°F | Resp 16 | Ht 62.0 in | Wt 118.6 lb

## 2022-08-02 DIAGNOSIS — R0600 Dyspnea, unspecified: Secondary | ICD-10-CM | POA: Diagnosis not present

## 2022-08-02 DIAGNOSIS — E7800 Pure hypercholesterolemia, unspecified: Secondary | ICD-10-CM

## 2022-08-02 DIAGNOSIS — I10 Essential (primary) hypertension: Secondary | ICD-10-CM

## 2022-08-02 DIAGNOSIS — R0602 Shortness of breath: Secondary | ICD-10-CM | POA: Insufficient documentation

## 2022-08-02 DIAGNOSIS — G20 Parkinson's disease: Secondary | ICD-10-CM

## 2022-08-02 DIAGNOSIS — E871 Hypo-osmolality and hyponatremia: Secondary | ICD-10-CM | POA: Diagnosis not present

## 2022-08-02 DIAGNOSIS — E559 Vitamin D deficiency, unspecified: Secondary | ICD-10-CM

## 2022-08-02 DIAGNOSIS — M25512 Pain in left shoulder: Secondary | ICD-10-CM

## 2022-08-02 DIAGNOSIS — G4700 Insomnia, unspecified: Secondary | ICD-10-CM | POA: Diagnosis not present

## 2022-08-02 DIAGNOSIS — G20A1 Parkinson's disease without dyskinesia, without mention of fluctuations: Secondary | ICD-10-CM

## 2022-08-02 LAB — LIPID PANEL
Cholesterol: 163 mg/dL (ref 0–200)
HDL: 60.2 mg/dL (ref >39.00)
LDL Cholesterol: 85 mg/dL (ref 0–99)
NonHDL: 102.76
Total CHOL/HDL Ratio: 3
Triglycerides: 89 mg/dL (ref 0.0–149.0)
VLDL: 17.8 mg/dL (ref 0.0–40.0)

## 2022-08-02 LAB — CBC
HCT: 41 % (ref 36.0–46.0)
Hemoglobin: 13.9 g/dL (ref 12.0–15.0)
MCHC: 33.9 g/dL (ref 30.0–36.0)
MCV: 93.4 fl (ref 78.0–100.0)
Platelets: 189 10*3/uL (ref 150.0–400.0)
RBC: 4.38 Mil/uL (ref 3.87–5.11)
RDW: 14.5 % (ref 11.5–15.5)
WBC: 5.1 10*3/uL (ref 4.0–10.5)

## 2022-08-02 LAB — COMPREHENSIVE METABOLIC PANEL
ALT: 7 U/L (ref 0–35)
AST: 22 U/L (ref 0–37)
Albumin: 4.5 g/dL (ref 3.5–5.2)
Alkaline Phosphatase: 37 U/L — ABNORMAL LOW (ref 39–117)
BUN: 17 mg/dL (ref 6–23)
CO2: 31 mEq/L (ref 19–32)
Calcium: 9.5 mg/dL (ref 8.4–10.5)
Chloride: 93 mEq/L — ABNORMAL LOW (ref 96–112)
Creatinine, Ser: 0.79 mg/dL (ref 0.40–1.20)
GFR: 66.72 mL/min (ref >60.00)
Glucose, Bld: 93 mg/dL (ref 70–99)
Potassium: 3.6 mEq/L (ref 3.5–5.1)
Sodium: 132 mEq/L — ABNORMAL LOW (ref 135–145)
Total Bilirubin: 0.9 mg/dL (ref 0.2–1.2)
Total Protein: 6.4 g/dL (ref 6.0–8.3)

## 2022-08-02 LAB — TSH: TSH: 1.32 u[IU]/mL (ref 0.35–5.50)

## 2022-08-02 LAB — VITAMIN D 25 HYDROXY (VIT D DEFICIENCY, FRACTURES): VITD: 54.21 ng/mL (ref 30.00–100.00)

## 2022-08-02 MED ORDER — DIAZEPAM 5 MG PO TABS: ORAL_TABLET | ORAL | 1 refills | Status: DC

## 2022-08-02 NOTE — Assessment & Plan Note (Signed)
Has trouble maintaining sleep due to tremor but diazepam is helpful is allowed a refill

## 2022-08-02 NOTE — Assessment & Plan Note (Addendum)
Dr Olive Bass at [facility]. Encouraged hydrate and take plenty of fiber in diet. Has an appt soon due to worsening tremor in left hand.

## 2022-08-02 NOTE — Assessment & Plan Note (Signed)
Notes her breathing gets more difficult with minimal exertion recently, likely related to Parkinson's but will check echo to evaluate her cardiac capacity.

## 2022-08-02 NOTE — Patient Instructions (Addendum)
RSV (respiratory syncitial virus) vaccine at pharmacy, Arexvy Covid booster when new version out late September At pharmacy High dose flu shot mid Sept to mid Oct     Ganglion Cyst  A ganglion cyst is a non-cancerous, fluid-filled lump of tissue that occurs near a joint, tendon, or ligament. The cyst grows out of a joint or the lining of a tendon or ligament. Ganglion cysts most often develop in the hand or wrist, but they can also develop in the shoulder, elbow, hip, knee, ankle, or foot. Ganglion cysts are ball-shaped or egg-shaped. Their size can range from the size of a pea to larger than a grape. Increased activity may cause the cyst to get bigger because more fluid starts to build up. What are the causes? The exact cause of this condition is not known, but it may be related to: Inflammation or irritation around the joint. An injury or tear in the layers of tissue around the joint (joint capsule). Repetitive movements or overuse. History of acute or repeated injury. What increases the risk? You are more likely to develop this condition if: You are a female. You are 18-32 years old. What are the signs or symptoms? The main symptom of this condition is a lump. It most often appears on the hand or wrist. In many cases, there are no other symptoms, but a cyst can sometimes cause: Tingling. Pain or tenderness. Numbness. Weakness or loss of strength in the affected joint. Decreased range of motion in the affected area of the body. How is this diagnosed? Ganglion cysts are usually diagnosed based on a physical exam. Your health care provider will feel the lump and may shine a light next to it. If it is a ganglion cyst, the light will likely shine through it. Your health care provider may order an X-ray, ultrasound, MRI, or CT scan to rule out other conditions. How is this treated? Ganglion cysts often go away on their own without treatment. If you have pain or other symptoms, treatment  may be needed. Treatment is also needed if the ganglion cyst limits your movement or if it gets infected. Treatment may include: Wearing a brace or splint on your wrist or finger. Taking anti-inflammatory medicine. Having fluid drained from the lump with a needle (aspiration). Getting an injection of medicine into the joint to decrease inflammation. This may be corticosteroids, ethanol, or hyaluronidase. Having surgery to remove the ganglion cyst. Placing a pad in your shoe or wearing shoes that will not rub against the cyst if it is on your foot. Follow these instructions at home: Do not press on the ganglion cyst, poke it with a needle, or hit it. Take over-the-counter and prescription medicines only as told by your health care provider. If you have a brace or splint: Wear it as told by your health care provider. Remove it as told by your health care provider. Ask if you need to remove it when you take a shower or a bath. Watch your ganglion cyst for any changes. Keep all follow-up visits as told by your health care provider. This is important. Contact a health care provider if: Your ganglion cyst becomes larger or more painful. You have pus coming from the lump. You have weakness or numbness in the affected area. You have a fever or chills. Get help right away if: You have a fever and have any of these in the cyst area: Increased redness. Red streaks. Swelling. Summary A ganglion cyst is a non-cancerous, fluid-filled lump  that occurs near a joint, tendon, or ligament. Ganglion cysts most often develop in the hand or wrist, but they can also develop in the shoulder, elbow, hip, knee, ankle, or foot. Ganglion cysts often go away on their own without treatment. This information is not intended to replace advice given to you by your health care provider. Make sure you discuss any questions you have with your health care provider. Document Revised: 01/23/2020 Document Reviewed:  01/23/2020 Elsevier Patient Education  [place], Adult Shortness of breath is when a person has trouble breathing or when a person feels like she or he is having trouble breathing in enough air. Shortness of breath could be a sign of a medical problem. Follow these instructions at home:  Pollutants Do not use any products that contain nicotine or tobacco. These products include cigarettes, chewing tobacco, and vaping devices, such as e-cigarettes. This also includes cigars and pipes. If you need help quitting, ask your health care provider. Avoid things that can irritate your airways, including: Smoke. This includes campfire smoke, forest fire smoke, and secondhand smoke from tobacco products. Do not smoke or allow others to smoke in your home. Mold. Dust. Air pollution. Chemical fumes. Things that can give you an allergic reaction (allergens) if you have allergies. Common allergens include pollen from grasses or trees and animal dander. Keep your living space clean and free of mold and dust. General instructions Pay attention to any changes in your symptoms. Take over-the-counter and prescription medicines only as told by your health care provider. This includes oxygen therapy and inhaled medicines. Rest as needed. Return to your normal activities as told by your health care provider. Ask your health care provider what activities are safe for you. Keep all follow-up visits. This is important. Contact a health care provider if: Your condition does not improve as soon as expected. You have a hard time doing your normal activities, even after you rest. You have new symptoms. You cannot walk up stairs or exercise the way that you normally do. Get help right away if: Your shortness of breath gets worse. You have shortness of breath when you are resting. You feel light-headed or you faint. You have a cough that is not controlled with medicines. You cough up  blood. You have pain with breathing. You have pain in your chest, arms, shoulders, or abdomen. You have a fever. These symptoms may be an emergency. Get help right away. Call 911. Do not wait to see if the symptoms will go away. Do not drive yourself to the hospital. Summary Shortness of breath is when a person has trouble breathing enough air. It can be a sign of a medical problem. Avoid things that irritate your lungs, such as smoking, pollution, mold, and dust. Pay attention to changes in your symptoms and contact your health care provider if you have a hard time completing daily activities because of shortness of breath. This information is not intended to replace advice given to you by your health care provider. Make sure you discuss any questions you have with your health care provider. Document Revised: 06/20/2021 Document Reviewed: 06/20/2021 Elsevier Patient Education  [place].

## 2022-08-02 NOTE — Assessment & Plan Note (Signed)
She has been told by her orthopaedist that she needs a replacement and she is going to proceed as her sleep is now disrupted.

## 2022-08-03 ENCOUNTER — Other Ambulatory Visit: Payer: Self-pay

## 2022-08-03 DIAGNOSIS — I10 Essential (primary) hypertension: Secondary | ICD-10-CM

## 2022-08-04 DIAGNOSIS — R2231 Localized swelling, mass and lump, right upper limb: Secondary | ICD-10-CM | POA: Diagnosis not present

## 2022-08-06 DIAGNOSIS — Z23 Encounter for immunization: Secondary | ICD-10-CM | POA: Diagnosis not present

## 2022-08-09 DIAGNOSIS — M19012 Primary osteoarthritis, left shoulder: Secondary | ICD-10-CM | POA: Diagnosis not present

## 2022-08-10 ENCOUNTER — Other Ambulatory Visit: Payer: Self-pay | Admitting: Family Medicine

## 2022-08-11 ENCOUNTER — Ambulatory Visit (HOSPITAL_BASED_OUTPATIENT_CLINIC_OR_DEPARTMENT_OTHER)
Admission: RE | Admit: 2022-08-11 | Discharge: 2022-08-11 | Disposition: A | Discharge status: Home / Self Care | Payer: Medicare Other | Source: Ambulatory Visit | Attending: Family Medicine | Admitting: Family Medicine

## 2022-08-11 DIAGNOSIS — R0609 Other forms of dyspnea: Secondary | ICD-10-CM

## 2022-08-11 DIAGNOSIS — R0600 Dyspnea, unspecified: Secondary | ICD-10-CM | POA: Insufficient documentation

## 2022-08-11 LAB — ECHOCARDIOGRAM COMPLETE
AR max vel: 1.45 cm2
AV Area VTI: 1.48 cm2
AV Area mean vel: 1.51 cm2
AV Mean grad: 6 mmHg
AV Peak grad: 10.9 mmHg
Ao pk vel: 1.65 m/s
Area-P 1/2: 2.61 cm2
S' Lateral: 2.5 cm

## 2022-08-12 ENCOUNTER — Encounter: Payer: Self-pay | Admitting: Family Medicine

## 2022-08-16 DIAGNOSIS — L603 Nail dystrophy: Secondary | ICD-10-CM | POA: Diagnosis not present

## 2022-08-16 DIAGNOSIS — I739 Peripheral vascular disease, unspecified: Secondary | ICD-10-CM | POA: Diagnosis not present

## 2022-08-16 DIAGNOSIS — L84 Corns and callosities: Secondary | ICD-10-CM | POA: Diagnosis not present

## 2022-08-17 ENCOUNTER — Encounter: Payer: Self-pay | Admitting: Family Medicine

## 2022-08-17 DIAGNOSIS — Z881 Allergy status to other antibiotic agents status: Secondary | ICD-10-CM | POA: Diagnosis not present

## 2022-08-17 DIAGNOSIS — Z885 Allergy status to narcotic agent status: Secondary | ICD-10-CM | POA: Diagnosis not present

## 2022-08-17 DIAGNOSIS — Z880 Allergy status to penicillin: Secondary | ICD-10-CM | POA: Diagnosis not present

## 2022-08-17 DIAGNOSIS — Z79899 Other long term (current) drug therapy: Secondary | ICD-10-CM | POA: Diagnosis not present

## 2022-08-17 DIAGNOSIS — Z888 Allergy status to other drugs, medicaments and biological substances status: Secondary | ICD-10-CM | POA: Diagnosis not present

## 2022-08-17 DIAGNOSIS — G20A1 Parkinson's disease without dyskinesia, without mention of fluctuations: Secondary | ICD-10-CM | POA: Diagnosis not present

## 2022-08-18 ENCOUNTER — Ambulatory Visit: Payer: Medicare Other | Attending: Family Medicine | Admitting: Physical Therapy

## 2022-08-18 ENCOUNTER — Encounter: Payer: Self-pay | Admitting: Physical Therapy

## 2022-08-18 DIAGNOSIS — M6281 Muscle weakness (generalized): Secondary | ICD-10-CM | POA: Insufficient documentation

## 2022-08-18 DIAGNOSIS — M542 Cervicalgia: Secondary | ICD-10-CM | POA: Insufficient documentation

## 2022-08-18 DIAGNOSIS — R2681 Unsteadiness on feet: Secondary | ICD-10-CM | POA: Insufficient documentation

## 2022-08-18 DIAGNOSIS — R2689 Other abnormalities of gait and mobility: Secondary | ICD-10-CM | POA: Insufficient documentation

## 2022-08-18 DIAGNOSIS — G20A1 Parkinson's disease without dyskinesia, without mention of fluctuations: Secondary | ICD-10-CM | POA: Insufficient documentation

## 2022-08-18 NOTE — Therapy (Signed)
OUTPATIENT PHYSICAL THERAPY PARKINSON'S DISEASE SCREEN   Patient Name: Stephanie [name] MRN: [id number] DOB:09-20-1932, 88 y.o., female Today's Date: 08/18/2022   PT End of Session - 08/18/22 1145     Visit Number 0    Authorization Type Medicare & BCBS    PT Start Time 1100    PT Stop Time 1130    PT Time Calculation (min) 30 min    Activity Tolerance Patient tolerated treatment well    Behavior During Therapy [facility] for tasks assessed/performed             Past Medical History:  Diagnosis Date   Allergy    Anemia    past hx    Arthropathy of cervical spine 11/27/2012   Right  Xray at [facility]  On 04/08/2016  AP, lateral, and lateral flexion and extension views of the cervical spine are submitted for evaluation.   No acute fracture identified in the cervical spine.   There is redemonstration of approximately 2 mm of C3 on C4 anterolisthesis in neutral which slightly increases in flexion relative to neutral and extension. Slight C5 on C6 retrolisthesis does not change i   Benign hypertension without congestive heart failure    Benign mole    right hcets mole, bleeds when dried with towel   Bilateral dry eyes    Bladder cancer ([place]) 2016-10-26   Cervical spondylosis    Cervicalgia    2 screws in place    Chronic kidney disease    kidney stones    Depression with anxiety 26-Oct-2016   prior to husbands death    Gross hematuria    developed after first hip replacment, led to indicental finding of bladder tumor, bleeding now resolved    History of kidney stones    1970's   Left hand weakness    due to reinjury of flxor tendon s/p tendon surgery march 12-2018   Lumbar stenosis    Malignant tumor of urinary bladder ([place]) 07/2016   Pre-stage I   OA (osteoarthritis)    Parkinson disease dx 2012   neurologist-  dr siddiqui at [place] [facility])     Dr Trudie Reed , [place] [facility]    Rupture of flexor tendon of hand 2020   right   Past Surgical History:  Procedure  Laterality Date   BIOPSY MASS LEFT FIRST METACARPAL   06/23/2006   benign   CATARACT EXTRACTION W/ INTRAOCULAR LENS  IMPLANT, BILATERAL  1999 and 2003   CERVICAL FUSION  02/2017   c1-c2 ; reports she has 2 screws 2in long in place done at [facility] ; patient exhibits VERY LIMITED NECK ROM   COLONOSCOPY     CYSTOSCOPY W/ RETROGRADES Bilateral 09/22/2016   Procedure: CYSTOSCOPY WITH RETROGRADE PYELOGRAM;  Surgeon: Alexis Frock, MD;  Location: [facility];  Service: Urology;  Laterality: Bilateral;   D & C HYSTEROSCOPY W/ RESECTION POLYP  07/11/2000   DILATION AND CURETTAGE OF UTERUS  1960's   EXCISION CYST AND DEBRIDEMENT RIGHT WIRST AND REMOVAL FORGEIGN BODY  01/09/2009   FLEXOR TENDON REPAIR Left 12/12/2018   Procedure: REPAIR/TRANSFER FLEXOR DIGITORUM PROFUNDUS OF LEFT SMALL FINGER;  Surgeon: Daryll Brod, MD;  Location: [place];  Service: Orthopedics;  Laterality: Left;   LAPAROSCOPY  yrs ago   infertility    METACARPOPHALANGEAL JOINT ARTHRODESIS Right 05/25/1996   TENDON REPAIR Left 01/15/2019   Hand   TONSILLECTOMY AND ADENOIDECTOMY  child   TOTAL HIP  ARTHROPLASTY Left 07/07/2016   Procedure: LEFT TOTAL HIP ARTHROPLASTY ANTERIOR APPROACH;  Surgeon: Gaynelle Arabian, MD;  Location: [facility];  Service: Orthopedics;  Laterality: Left;   TOTAL HIP ARTHROPLASTY Right 09/28/2017   Procedure: RIGHT TOTAL HIP ARTHROPLASTY ANTERIOR APPROACH;  Surgeon: Gaynelle Arabian, MD;  Location: [facility];  Service: Orthopedics;  Laterality: Right;   TOTAL KNEE ARTHROPLASTY Right 07/30/2019   Procedure: TOTAL KNEE ARTHROPLASTY;  Surgeon: Gaynelle Arabian, MD;  Location: [facility];  Service: Orthopedics;  Laterality: Right;  63mn   TRANSURETHRAL RESECTION OF BLADDER TUMOR N/A 09/22/2016   Procedure: TRANSURETHRAL RESECTION OF BLADDER TUMOR (TURBT);  Surgeon: TAlexis Frock MD;  [facility]  Service: Urology;  Laterality: N/A;   Patient Active Problem List    Diagnosis Date Noted   Dyspnea 08/02/2022   Insomnia 08/02/2022   Hyponatremia 08/01/2022   Shoulder pain, left 09/14/2021   History of COVID-19 01/06/2021   Diarrhea 09/03/2020   Vitamin D deficiency 09/03/2020   OA (osteoarthritis) of knee 07/30/2019   Tinea corporis 06/08/2019   [place]ori[place] 04/02/2019   Anemia 03/13/2018   Hot flashes 03/13/2018   S/P cervical spinal fusion 03/31/2017   Neck pain 12/12/2016   Depression with anxiety 10/17/2016   Bladder cancer ([place] 10/17/2016   OA (osteoarthritis) of hip 07/07/2016   Spinal stenosis of lumbar region with radiculopathy 04/08/2016   Spondylolisthesis of lumbar region 04/08/2016   Spondylosis of cervical region without myelopathy or radiculopathy 04/08/2016   Preventative health care 01/18/2016   Low back pain 01/18/2016   History of chicken pox    Hyperlipidemia 12/15/2014   Allergic rhinitis 06/25/2014   Allergy to bee sting 06/10/2014   TMJ tenderness 11/16/2013   Other malaise and fatigue 05/07/2013   [place] 11/27/2012   Muscle spasms of neck 11/27/2012   Parkinson's disease 05/24/2012   Conjunctivochalasis 03/09/2012   Epiretinal membrane 03/09/2012   Hyperopia with astigmatism and presbyopia 03/09/2012   Pseudophakia 03/09/2012   HTN (hypertension) 08/31/2011   Right knee pain 08/31/2011   Benign hypertensive heart disease without heart failure 05/12/2011    PCP: BMosie Lukes, MD  REFERRING PROVIDER: SEldridge Abrahams MD  REFERRING DIAG: Parkinson's disease  RATIONALE FOR EVALUATION AND TREATMENT: PT screen  THERAPY DIAG:  Parkinson's disease, unspecified whether dyskinesia present, unspecified whether manifestations fluctuate  ONSET DATE: 2011  MOST RECENT PT EPISODE FOR PD: 10/2021 - 02/2022 (including referral for multiple diagnoses: Lshoulder pain, LBP, L upper back and neck pain, psoriatic arthritis; in addition to Parkinson's disease)  SUBJECTIVE:  SUBJECTIVE STATEMENT: [name] reports she continues to have pain in her L shoulder that prevents her from sleeping well at night - scheduled for a reverse TSA on 10/14/22. She also notes DOE with walking. Dr. Charlett Blake noted some abnormality on her EKG so she wants to see a cardiologist before she goes forward with the shoulder surgery. She also notes more difficulty with walking recently - her legs feel harder to move which she thinks may contribute to the DOE. She saw her PD neurologist yesterday who gave her a referral for PT to address her Parkinson's.  PAIN:  Are you having pain? Yes: NPRS scale: 0/10 at rest, with movement >5/10 Pain location: L side of neck and L shoulder Pain description: tightness, sharp Aggravating factors: moving her head, walking Relieving factors: heat  PERTINENT HISTORY:  R shoulder advanced OA - scheduled for reverse TSA on 10/14/22; R TKA 07/2019, B THR 2018 & 2017, posterior C1-2 cervical fusion 2018, rupture of flexor tendon of L hand with failed surgical repair 2020, Parkinson's, bladder cancer, HTN   PRECAUTIONS: Fall  WEIGHT BEARING RESTRICTIONS: No  FALLS:  Has patient fallen in last 6 months? No  LIVING ENVIRONMENT: Lives with: lives alone Lives in: House/apartment Stairs: Yes: External: 4 steps; on right going up, on left going up, and can reach both to garage Has following equipment at home: Single point cane, Walker - 2 wheeled, shower chair, and Grab bars  OCCUPATION: Retired  PLOF:  Independent and Leisure: daily stretching/exercises ~20 min/day; walk 1/4 mile daily; active in church - bible study    PATIENT GOALS: "To be be able to walk safely after my shoulder surgery."   OBJECTIVE:   PHYSICAL THERAPY PARKINSON'S DISEASE SCREEN  TUG - Timed Up and Go test:  10.78 sec (Normal), 10.59 sec (Manual), 12.25 sec (Cognitive) Baseline as of D/C from last PT episode: 8.88 sec (Normal)  5 time sit to stand test: 16.23 sec Baseline as of D/C from last PT episode: 12.53 sec   10 meter walk test: 10.34 sec (pt noting DOE) Baseline as of D/C from last PT episode: 8.82 sec   Gait speed: 3.17 ft/sec (pt noting DOE) Baseline as of D/C from last PT episode: 3.72 ft/sec    ASSESSMENT / PLAN:  CLINICAL IMPRESSION: GERALDINE SANDBERG is an 88 y.o. female who was seen today for a 6 month physical therapy screen for Parkinson's disease.   PD Screen Recommendations:  Patient would benefit from Physical Therapy evaluation due to declining functional status with worsening times on standardized balance testing, increasing difficulty with ambulation and DOE.  Deitra reports she saw her neurologist, Eldridge Abrahams, MD, yesterday and he gave her a referral for PT to eval and treat for her Parkinson's disease.   PATIENT EDUCATION:  Education details: PT eval findings Person educated: Patient Education method: Explanation Education comprehension: verbalized understanding    Percival Spanish, PT 08/18/2022, 12:02 PM

## 2022-08-19 DIAGNOSIS — R5383 Other fatigue: Secondary | ICD-10-CM | POA: Diagnosis not present

## 2022-08-19 DIAGNOSIS — L4050 Arthropathic psoriasis, unspecified: Secondary | ICD-10-CM | POA: Diagnosis not present

## 2022-08-19 DIAGNOSIS — Z79899 Other long term (current) drug therapy: Secondary | ICD-10-CM | POA: Diagnosis not present

## 2022-08-19 DIAGNOSIS — Z111 Encounter for screening for respiratory tuberculosis: Secondary | ICD-10-CM | POA: Diagnosis not present

## 2022-08-23 NOTE — Progress Notes (Addendum)
Cardiology Office Note:    Date:  08/31/2022   ID:  Stephanie [name], DOB 1932-05-14, MRN [id number]  PCP:  Mosie Lukes, MD   [facility] Cardiologist:  Fransico Him, MD     Referring MD: Eldridge Abrahams, MD   CC: Here for preoperative cardiovascular risk assessment and regular follow-up  History of Present Illness:    Stephanie [name] is a 88 y.o. female with a hx of the following:   Parkinson's disease Psoriatic arthritis  Hyperlipidemia  Hypertension Lumbar spinal stenosis with radiculopathy Osteoarthritis Depression Anxiety  She was initially evaluated by Dr. Radford Pax for preoperative cardiovascular risk assessment in 2021.  She was overall doing well from a cardiac perspective.  Blood pressure was well controlled on exam. She last saw Dr. Radford Pax via video visit on December 24, 2020.  Primary care provider arrange echocardiogram last months.  EF 60 to 65%, no RWMA, grade 1 diastolic dysfunction, mild MR, otherwise normal echo.   She presents today for 1 year follow-up and is scheduled to have reverse shoulder arthroplasty by Dr. Justice Britain on October 14, 2022.  She is not on any antiplatelet, blood thinner, or aspirin that needs to be held prior to procedure. CC today is DOE, that has gotten worse over the past year.  She brings in her echocardiogram results, this was performed end of last month by PCP.  Says she used to walk 2 miles a day, and now can only walk a third of a mile. Only notices this with walking up steep inclines or hills. Denies any chest pain, palpitations, syncope, presyncope, dizziness, orthopnea, PND, swelling, weight changes, bleeding, or claudication.  Thinks it may be due to physical deconditioning due to her history of Parkinson's.  She is concerned about her upcoming surgery, as her mother had a major cardiac event and died from hip surgery, and says this is at the forefront of her mind. Denies any other questions or concerns.     Past Medical History:  Diagnosis Date   Allergy    Anemia    past hx    Arthropathy of cervical spine 11/27/2012   Right  Xray at [facility]  On 04/08/2016  AP, lateral, and lateral flexion and extension views of the cervical spine are submitted for evaluation.   No acute fracture identified in the cervical spine.   There is redemonstration of approximately 2 mm of C3 on C4 anterolisthesis in neutral which slightly increases in flexion relative to neutral and extension. Slight C5 on C6 retrolisthesis does not change i   Benign hypertension without congestive heart failure    Benign mole    right hcets mole, bleeds when dried with towel   Bilateral dry eyes    Bladder cancer (HCC) 10-27-2016   Cervical spondylosis    Cervicalgia    2 screws in place    Chronic kidney disease    kidney stones    Depression with anxiety 10/27/2016   prior to husbands death    Gross hematuria    developed after first hip replacment, led to indicental finding of bladder tumor, bleeding now resolved    History of kidney stones    1970's   Left hand weakness    due to reinjury of flxor tendon s/p tendon surgery march 12-2018   Lumbar stenosis    Malignant tumor of urinary bladder ([place]) 07/2016   Pre-stage I   OA (osteoarthritis)    Parkinson disease dx 2012   neurologist-  dr siddiqui at [facility])     Dr Trudie Reed , [facility]    Rupture of flexor tendon of hand 2020   right    Past Surgical History:  Procedure Laterality Date   BIOPSY MASS LEFT FIRST METACARPAL   06/23/2006   benign   CATARACT EXTRACTION W/ INTRAOCULAR LENS  IMPLANT, BILATERAL  1999 and 2003   CERVICAL FUSION  02/2017   c1-c2 ; reports she has 2 screws 2in long in place done at [facility] ; patient exhibits VERY LIMITED NECK ROM   COLONOSCOPY     CYSTOSCOPY W/ RETROGRADES Bilateral 09/22/2016   Procedure: CYSTOSCOPY WITH RETROGRADE PYELOGRAM;  Surgeon: Alexis Frock, MD;  Location: [facility];  Service: Urology;  Laterality: Bilateral;   D & C HYSTEROSCOPY W/ RESECTION POLYP  07/11/2000   DILATION AND CURETTAGE OF UTERUS  1960's   EXCISION CYST AND DEBRIDEMENT RIGHT WIRST AND REMOVAL FORGEIGN BODY  01/09/2009   FLEXOR TENDON REPAIR Left 12/12/2018   Procedure: REPAIR/TRANSFER FLEXOR DIGITORUM PROFUNDUS OF LEFT SMALL FINGER;  Surgeon: Daryll Brod, MD;  Location: [facility];  Service: Orthopedics;  Laterality: Left;   LAPAROSCOPY  yrs ago   infertility    METACARPOPHALANGEAL JOINT ARTHRODESIS Right 05/25/1996   TENDON REPAIR Left 01/15/2019   Hand   TONSILLECTOMY AND ADENOIDECTOMY  child   TOTAL HIP ARTHROPLASTY Left 07/07/2016   Procedure: LEFT TOTAL HIP ARTHROPLASTY ANTERIOR APPROACH;  Surgeon: Gaynelle Arabian, MD;  Location: [facility];  Service: Orthopedics;  Laterality: Left;   TOTAL HIP ARTHROPLASTY Right 09/28/2017   Procedure: RIGHT TOTAL HIP ARTHROPLASTY ANTERIOR APPROACH;  Surgeon: Gaynelle Arabian, MD;  Location: [facility];  Service: Orthopedics;  Laterality: Right;   TOTAL KNEE ARTHROPLASTY Right 07/30/2019   Procedure: TOTAL KNEE ARTHROPLASTY;  Surgeon: Gaynelle Arabian, MD;  Location: [facility];  Service: Orthopedics;  Laterality: Right;  42mn   TRANSURETHRAL RESECTION OF BLADDER TUMOR N/A 09/22/2016   Procedure: TRANSURETHRAL RESECTION OF BLADDER TUMOR (TURBT);  Surgeon: TAlexis Frock MD;  [facility]  Service: Urology;  Laterality: N/A;    Current Medications: Current Meds  Medication Sig   amLODipine (NORVASC) 5 MG tablet TAKE 1 TABLET (5 MG TOTAL) BY MOUTH DAILY.   budesonide (ENTOCORT EC) 3 MG 24 hr capsule Take 3 mg by mouth as needed.   carbidopa-levodopa (SINEMET IR) 25-100 MG per tablet Take 1 tablet by mouth 6 (six) times daily.   Carbidopa-Levodopa ER (SINEMET CR) 25-100 MG tablet controlled release Take 1 tablet by mouth at bedtime.   cholecalciferol (VITAMIN D3) 25 MCG (1000 UNIT) tablet Take 1,000 Units by mouth  daily.   cholestyramine (QUESTRAN) 4 g packet Take 4 g by mouth as needed.   diazepam (VALIUM) 5 MG tablet TAKE 1 TABLET (5 MG TOTAL) BY MOUTH EVERY 12 (TWELVE) HOURS AS NEEDED. FOR ANXIETY   estradiol (ESTRACE) 0.5 MG tablet TAKE 1/2 OF A TABLET (0.25 MG TOTAL) BY MOUTH EVERY DAY   famotidine (PEPCID) 40 MG tablet TAKE 1 TABLET BY MOUTH AT BEDTIME AS NEEDED FOR HEARTBURN OR INDIGESTION   fluticasone (CUTIVATE) 0.05 % cream Apply 1 application topically daily as needed (scalp psoriasis).   golimumab (SIMPONI ARIA) 50 MG/4ML SOLN injection Inject 50 mg into the vein See admin instructions. Given every 2 months - Last given 06/28/2019   hydrochlorothiazide (HYDRODIURIL) 25 MG tablet Take 1.5 tablets (37.5 mg total) by mouth daily.   medroxyPROGESTERone (PROVERA) 2.5  MG tablet Take 0.5 tablets (1.25 mg total) by mouth daily.   melatonin 5 MG TABS Take 5 mg by mouth at bedtime as needed.   meloxicam (MOBIC) 15 MG tablet Take 15 mg by mouth daily. 3-5x/week   Multiple Vitamins-Minerals (CENTRUM SILVER 50+WOMEN) TABS Take by mouth daily.   pantoprazole (PROTONIX) 40 MG tablet Take 1 tablet (40 mg total) by mouth daily.   Probiotic Product (PROBIOTIC PO) Take by mouth.   sertraline (ZOLOFT) 25 MG tablet Take 1 tablet (25 mg total) by mouth daily. (Patient taking differently: Take 25 mg by mouth as needed.)   vitamin C (ASCORBIC ACID) 500 MG tablet Take 500 mg by mouth daily.     Allergies:   Clindamycin hcl, Codeine, Doxycycline, Hornet venom, Penicillins, Yellow jacket venom, Lodine [etodolac], and Tramadol   Social History   Socioeconomic History   Marital status: Widowed    Spouse name: Not on file   Number of children: Not on file   Years of education: Not on file   Highest education level: Not on file  Occupational History   Not on file  Tobacco Use   Smoking status: Former    Years: 10.00    Types: Cigarettes    Quit date: 05/04/1964    Years since quitting: 58.3   Smokeless tobacco:  Never  Vaping Use   Vaping Use: Never used  Substance and Sexual Activity   Alcohol use: Yes    Alcohol/week: 7.0 standard drinks of alcohol    Types: 7 Glasses of wine per week    Comment: ocassionally- social    Drug use: No   Sexual activity: Not Currently    Partners: Male  Other Topics Concern   Not on file  Social History Narrative   Not on file   Social Determinants of Health   Financial Resource Strain: Low Risk  (04/27/2021)   Overall Financial Resource Strain (CARDIA)    Difficulty of Paying Living Expenses: Not hard at all  Food Insecurity: No Food Insecurity (04/27/2021)   Hunger Vital Sign    Worried About Running Out of Food in the Last Year: Never true    Ran Out of Food in the Last Year: Never true  Transportation Needs: No Transportation Needs (04/27/2021)   PRAPARE - Hydrologist (Medical): No    Lack of Transportation (Non-Medical): No  Physical Activity: Sufficiently Active (04/27/2021)   Exercise Vital Sign    Days of Exercise per Week: 5 days    Minutes of Exercise per Session: 30 min  Stress: No Stress Concern Present (04/27/2021)   [place]    Feeling of Stress : Not at all  Social Connections: Moderately Integrated (04/27/2021)   Social Connection and Isolation Panel [NHANES]    Frequency of Communication with Friends and Family: More than three times a week    Frequency of Social Gatherings with Friends and Family: More than three times a week    Attends Religious Services: More than 4 times per year    Active Member of [employer] or Organizations: Yes    Attends Archivist Meetings: More than 4 times per year    Marital Status: Widowed     Family History: The patient's family history includes Arthritis in her father, mother, and sister; Bladder Cancer in her father; Deep vein thrombosis in her mother; Diabetes in her sister; Esophageal cancer in her  paternal aunt; Heart disease  in her maternal grandfather, maternal grandmother, and sister; Hypertension in her mother and sister; Obesity in her sister; Peripheral vascular disease in her paternal grandmother; Stroke in her father and paternal grandfather. There is no history of Colon cancer, Colon polyps, Rectal cancer, or Stomach cancer.  ROS:   Review of Systems  Constitutional: Negative.   HENT: Negative.    Eyes: Negative.   Respiratory:  Positive for shortness of breath. Negative for cough, hemoptysis, sputum production and wheezing.        See HPI.   Cardiovascular: Negative.   Gastrointestinal: Negative.   Genitourinary: Negative.   Musculoskeletal:  Positive for joint pain and myalgias. Negative for back pain, falls and neck pain.       History of tendon ruptures due to psoriatic arthritis.  She has a recent tendon ruptures along her fingers of her right hand.  Skin: Negative.   Neurological:  Positive for dizziness, tremors and weakness. Negative for tingling, sensory change, speech change, focal weakness, seizures, loss of consciousness and headaches.       All related to history of Parkinson's disease.  See HPI.  Endo/Heme/Allergies: Negative.   Psychiatric/Behavioral: Negative.      Please see the history of present illness.    All other systems reviewed and are negative.  EKGs/Labs/Other Studies Reviewed:    The following studies were reviewed today:   EKG:  EKG is not ordered today.   2D echocardiogram on August 11, 2022:  1. GLS -17.1. Left ventricular ejection fraction, by estimation, is 60 to  65%. Left ventricular ejection fraction by PLAX is 66 %. The left  ventricle has normal function. The left ventricle has no regional wall  motion abnormalities. Left ventricular  diastolic parameters are consistent with Grade I diastolic dysfunction  (impaired relaxation).   2. Right ventricular systolic function is normal. The right ventricular  size is normal.    3. The mitral valve is normal in structure. Mild mitral valve  regurgitation. No evidence of mitral stenosis.   4. The aortic valve is normal in structure. Aortic valve regurgitation is  not visualized. No aortic stenosis is present.   5. The inferior vena cava is normal in size with greater than 50%  respiratory variability, suggesting right atrial pressure of 3 mmHg.    Recent Labs: 08/02/2022: ALT 7; BUN 17; Creatinine, Ser 0.79; Hemoglobin 13.9; Platelets 189.0; Potassium 3.6; Sodium 132; TSH 1.32  Recent Lipid Panel    Component Value Date/Time   CHOL 163 08/02/2022 1058   CHOL 173 12/31/2019 0852   TRIG 89.0 08/02/2022 1058   HDL 60.20 08/02/2022 1058   HDL 58 12/31/2019 0852   CHOLHDL 3 08/02/2022 1058   VLDL 17.8 08/02/2022 1058   LDLCALC 85 08/02/2022 1058   LDLCALC 113 (H) 09/02/2020 1600   LDLDIRECT 120.0 12/11/2014 1040   Physical Exam:    VS:  BP 104/60   Pulse 90   Ht '5\' 2"'$  (1.575 m)   Wt 119 lb (54 kg)   LMP 01/13/2013 Comment: spotting-had benign endometrial biopsy   SpO2 98%   BMI 21.77 kg/m     Wt Readings from Last 3 Encounters:  08/27/22 119 lb (54 kg)  08/24/22 119 lb (54 kg)  08/02/22 118 lb 9.6 oz (53.8 kg)     GEN: Thin, frail 88 y.o. Caucasian female in NAD  HEENT: Normal NECK: No JVD; No carotid bruits CARDIAC: S1/S2, RRR, no murmurs, rubs, gallops; 2+ peripheral pulses throughout, strong and equal bilaterally  RESPIRATORY:  Diminished, bibasilar crackles to auscultation without rales, wheezing or rhonchi, overall diminished  MUSCULOSKELETAL:  No edema; No deformity  SKIN: Warm and dry NEUROLOGIC:  Alert and oriented x 3 PSYCHIATRIC:  Normal affect   ASSESSMENT:    1. Preoperative cardiovascular examination   2. Dyspnea on exertion   3. Abnormal breath sounds   4. Hypertension, unspecified type   5. Hyperlipidemia, unspecified hyperlipidemia type   6. NSAID long-term use    PLAN:    In order of problems listed  above:  Preoperative cardiovascular risk assessment    Ms. Lamb's perioperative risk of a major cardiac event is 0.4% according to the Revised Cardiac Risk Index (RCRI).  Therefore, she is at low risk for perioperative complications.   Her functional capacity is fair at 4.95 METs according to the Duke Activity Status Index (DASI). Recommendations: According to ACC/AHA guidelines, no further cardiovascular testing needed.  The patient may proceed to surgery at acceptable risk.   Antiplatelet and/or Anticoagulation Recommendations: Patient is not on any anticoagulation or antiplatelet that needs to be held prior to procedure.  She does not require antibiotic prophylaxis prior to procedure. Overall patient is doing well; however, she had some concerns regarding her health and wanted to follow-up with ischemic evaluation prior to surgery. Nuclear medicine stress test arranged as outlined below. If testing comes back low risk and WNL, patient may proceed to surgery at an acceptable risk.   Dyspnea on exertion, abnormal breath sounds Worsening shortness of breath with exertion over the past year.  Recent TTE showed EF 60 to 93%, grade 1 diastolic dysfunction.  She was requesting further ischemic evaluation prior to procedure. Discussed Lexiscan stress testing, including risks and benefits and she is agreeable to proceed. Bibasilar, diminished crackles on exam. Will arrange 2 view CXR. If NST comes back negative or WNL and breathing has not improved, plan to refer to pulmonology.   Shared Decision Making/Informed Consent The risks [chest pain, shortness of breath, cardiac arrhythmias, dizziness, blood pressure fluctuations, myocardial infarction, stroke/transient ischemic attack, nausea, vomiting, allergic reaction, radiation exposure, metallic taste sensation and life-threatening complications (estimated to be 1 in 10,000)], benefits (risk stratification, diagnosing coronary artery disease, treatment  guidance) and alternatives of a nuclear stress test were discussed in detail with Ms. Edman and she agrees to proceed.   3. HTN BP today 104/60.  BP well controlled at home. Recent labwork stable. Heart healthy diet and regular cardiovascular exercise encouraged. Discussed to monitor BP at home at least 2 hours after medications and sitting for 5-10 minutes. Continue amlodipine daily.  4. HLD Recent LDL 85, total cholesterol 163. Currently not on any lipid lowering medications. PCP to manage. Heart healthy diet and regular cardiovascular exercise encouraged.   5. NSAID long term use Due to long standing history of psoriatic arthritis. Discussed PPI therapy for GI protection and she is agreeable to start medication. Initiate Protonix 40 mg daily.   6. Disposition: Follow up  in 6 weeks with APP or sooner if anything changes. Will need EKG for official pre-op clearance at next visit.     Medication Adjustments/Labs and Tests Ordered: Current medicines are reviewed at length with the patient today.  Concerns regarding medicines are outlined above.  Orders Placed This Encounter  Procedures   DG Chest 2 View   Cardiac Stress Test: Informed Consent Details: Physician/Practitioner Attestation; Transcribe to consent form and obtain patient signature   MYOCARDIAL PERFUSION IMAGING   Meds ordered this encounter  Medications  pantoprazole (PROTONIX) 40 MG tablet    Sig: Take 1 tablet (40 mg total) by mouth daily.    Dispense:  30 tablet    Refill:  2    Order Specific Question:   Supervising Provider    Answer:   Arnoldo Lenis [[id number]]    Patient Instructions  Medication Instructions:  Your physician recommends that you continue on your current medications as directed. Please refer to the Current Medication list given to you today.  *If you need a refill on your cardiac medications before your next appointment, please call your pharmacy*   Lab Work: None ordered  If you have  labs (blood work) drawn today and your tests are completely normal, you will receive your results only by: [place] (if you have MyChart) OR A paper copy in the mail If you have any lab test that is abnormal or we need to change your treatment, we will call you to review the results.   Testing/Procedures: Your physician has requested that you have a lexiscan myoview. For further information please visit [email or web address]. Please follow instruction sheet, BELOW:    You are scheduled for a Myocardial Perfusion Imaging Study  Please arrive 15 minutes prior to your appointment time for registration and insurance purposes.  The test will take approximately 3 to 4 hours to complete; you may bring reading material.  If someone comes with you to your appointment, they will need to remain in the main lobby due to limited space in the testing area. **If you are pregnant or breastfeeding, please notify the nuclear lab prior to your appointment**  How to prepare for your Myocardial Perfusion Test: Do not eat or drink 3 hours prior to your test, except you may have water. Do not consume products containing caffeine (regular or decaffeinated) 12 hours prior to your test. (ex: coffee, chocolate, sodas, tea). Do bring a list of your current medications with you.  If not listed below, you may take your medications as normal. Do wear comfortable clothes (no dresses or overalls) and walking shoes, tennis shoes preferred (No heels or open toe shoes are allowed). Do NOT wear cologne, perfume, aftershave, or lotions (deodorant is allowed). If these instructions are not followed, your test will have to be rescheduled.   A chest x-ray takes a picture of the organs and structures inside the chest, including the heart, lungs, and blood vessels. This test can show several things, including, whether the heart is enlarges; whether fluid is building up in the lungs; and whether pacemaker / defibrillator leads  are still in place. GO TO [facility], TODAY BEFORE 4:30  [place], [place], [place] [address]     Follow-Up: At [facility], you and your health needs are our priority.  As part of our continuing mission to provide you with exceptional heart care, we have created designated Provider Care Teams.  These Care Teams include your primary Cardiologist (physician) and Advanced Practice Providers (APPs -  Physician Assistants and Nurse Practitioners) who all work together to provide you with the care you need, when you need it.  We recommend signing up for the patient portal called "MyChart".  Sign up information is provided on this After Visit Summary.  MyChart is used to connect with patients for Virtual Visits (Telemedicine).  Patients are able to view lab/test results, encounter notes, upcoming appointments, etc.  Non-urgent messages can be sent to your provider as well.   To learn more about what  you can do with MyChart, go to [email or web address].    Your next appointment:   6 week(s)  The format for your next appointment:   In Person  Provider:   Richardson Dopp, PA-C         Other Instructions   Important Information About Sugar         Signed, Finis Bud, NP  08/31/2022 10:56 AM    [place]

## 2022-08-24 ENCOUNTER — Encounter: Payer: Self-pay | Admitting: Nurse Practitioner

## 2022-08-24 ENCOUNTER — Ambulatory Visit
Admission: RE | Admit: 2022-08-24 | Discharge: 2022-08-24 | Disposition: A | Discharge status: Home / Self Care | Payer: Medicare Other | Source: Ambulatory Visit | Attending: Nurse Practitioner | Admitting: Nurse Practitioner

## 2022-08-24 ENCOUNTER — Ambulatory Visit: Payer: Medicare Other | Attending: Physician Assistant | Admitting: Nurse Practitioner

## 2022-08-24 VITALS — BP 104/60 | HR 90 | Ht 62.0 in | Wt 119.0 lb

## 2022-08-24 DIAGNOSIS — E785 Hyperlipidemia, unspecified: Secondary | ICD-10-CM | POA: Diagnosis not present

## 2022-08-24 DIAGNOSIS — Z01810 Encounter for preprocedural cardiovascular examination: Secondary | ICD-10-CM | POA: Insufficient documentation

## 2022-08-24 DIAGNOSIS — I10 Essential (primary) hypertension: Secondary | ICD-10-CM | POA: Insufficient documentation

## 2022-08-24 DIAGNOSIS — J9811 Atelectasis: Secondary | ICD-10-CM | POA: Diagnosis not present

## 2022-08-24 DIAGNOSIS — Z791 Long term (current) use of non-steroidal anti-inflammatories (NSAID): Secondary | ICD-10-CM | POA: Diagnosis not present

## 2022-08-24 DIAGNOSIS — R0689 Other abnormalities of breathing: Secondary | ICD-10-CM

## 2022-08-24 DIAGNOSIS — M47814 Spondylosis without myelopathy or radiculopathy, thoracic region: Secondary | ICD-10-CM | POA: Diagnosis not present

## 2022-08-24 DIAGNOSIS — R0609 Other forms of dyspnea: Secondary | ICD-10-CM

## 2022-08-24 DIAGNOSIS — R0989 Other specified symptoms and signs involving the circulatory and respiratory systems: Secondary | ICD-10-CM

## 2022-08-24 DIAGNOSIS — I771 Stricture of artery: Secondary | ICD-10-CM | POA: Diagnosis not present

## 2022-08-24 DIAGNOSIS — R918 Other nonspecific abnormal finding of lung field: Secondary | ICD-10-CM | POA: Diagnosis not present

## 2022-08-24 MED ORDER — PANTOPRAZOLE SODIUM 40 MG PO TBEC: 40.0000 mg | DELAYED_RELEASE_TABLET | Freq: Every day | ORAL | 2 refills | Status: DC

## 2022-08-24 NOTE — Patient Instructions (Addendum)
Medication Instructions:  Your physician recommends that you continue on your current medications as directed. Please refer to the Current Medication list given to you today.  *If you need a refill on your cardiac medications before your next appointment, please call your pharmacy*   Lab Work: None ordered  If you have labs (blood work) drawn today and your tests are completely normal, you will receive your results only by: [place] (if you have MyChart) OR A paper copy in the mail If you have any lab test that is abnormal or we need to change your treatment, we will call you to review the results.   Testing/Procedures: Your physician has requested that you have a lexiscan myoview. For further information please visit [email or web address]. Please follow instruction sheet, BELOW:    You are scheduled for a Myocardial Perfusion Imaging Study  Please arrive 15 minutes prior to your appointment time for registration and insurance purposes.  The test will take approximately 3 to 4 hours to complete; you may bring reading material.  If someone comes with you to your appointment, they will need to remain in the main lobby due to limited space in the testing area. **If you are pregnant or breastfeeding, please notify the nuclear lab prior to your appointment**  How to prepare for your Myocardial Perfusion Test: Do not eat or drink 3 hours prior to your test, except you may have water. Do not consume products containing caffeine (regular or decaffeinated) 12 hours prior to your test. (ex: coffee, chocolate, sodas, tea). Do bring a list of your current medications with you.  If not listed below, you may take your medications as normal. Do wear comfortable clothes (no dresses or overalls) and walking shoes, tennis shoes preferred (No heels or open toe shoes are allowed). Do NOT wear cologne, perfume, aftershave, or lotions (deodorant is allowed). If these instructions are not followed, your  test will have to be rescheduled.   A chest x-ray takes a picture of the organs and structures inside the chest, including the heart, lungs, and blood vessels. This test can show several things, including, whether the heart is enlarges; whether fluid is building up in the lungs; and whether pacemaker / defibrillator leads are still in place. GO TO [facility], TODAY BEFORE 4:30  [place], [place], [place] [address]     Follow-Up: At [facility], you and your health needs are our priority.  As part of our continuing mission to provide you with exceptional heart care, we have created designated Provider Care Teams.  These Care Teams include your primary Cardiologist (physician) and Advanced Practice Providers (APPs -  Physician Assistants and Nurse Practitioners) who all work together to provide you with the care you need, when you need it.  We recommend signing up for the patient portal called "MyChart".  Sign up information is provided on this After Visit Summary.  MyChart is used to connect with patients for Virtual Visits (Telemedicine).  Patients are able to view lab/test results, encounter notes, upcoming appointments, etc.  Non-urgent messages can be sent to your provider as well.   To learn more about what you can do with MyChart, go to [email or web address].    Your next appointment:   6 week(s)  The format for your next appointment:   In Person  Provider:   Richardson Dopp, PA-C         Other Instructions   Important Information About Sugar

## 2022-08-24 NOTE — Therapy (Signed)
OUTPATIENT PHYSICAL THERAPY PARKINSON'S EVALUATION   Patient Name: Stephanie [name] MRN: [id number] DOB:1932/05/21, 88 y.o., female Today's Date: 08/25/2022    PT End of Session - 08/25/22 1146     Visit Number 1    Date for PT Re-Evaluation 10/06/22    Authorization Type Medicare & BCBS    Authorization - Visit Number --    Authorization - Number of Visits --    PT Start Time 1146    PT Stop Time 1256    PT Time Calculation (min) 70 min    Activity Tolerance Patient tolerated treatment well;Patient limited by pain    Behavior During Therapy [facility] for tasks assessed/performed             Past Medical History:  Diagnosis Date   Allergy    Anemia    past hx    Arthropathy of cervical spine 11/27/2012   Right  Xray at [facility]  On 04/08/2016  AP, lateral, and lateral flexion and extension views of the cervical spine are submitted for evaluation.   No acute fracture identified in the cervical spine.   There is redemonstration of approximately 2 mm of C3 on C4 anterolisthesis in neutral which slightly increases in flexion relative to neutral and extension. Slight C5 on C6 retrolisthesis does not change i   Benign hypertension without congestive heart failure    Benign mole    right hcets mole, bleeds when dried with towel   Bilateral dry eyes    Bladder cancer ([place]) 10-30-16   Cervical spondylosis    Cervicalgia    2 screws in place    Chronic kidney disease    kidney stones    Depression with anxiety 2016-10-30   prior to husbands death    Gross hematuria    developed after first hip replacment, led to indicental finding of bladder tumor, bleeding now resolved    History of kidney stones    1970's   Left hand weakness    due to reinjury of flxor tendon s/p tendon surgery march 12-2018   Lumbar stenosis    Malignant tumor of urinary bladder ([place]) 07/2016   Pre-stage I   OA (osteoarthritis)    Parkinson disease dx 2012   neurologist-  dr siddiqui at [place]  arthritis [facility])     Dr Trudie Reed , [place] [facility]    Rupture of flexor tendon of hand 2020   right   Past Surgical History:  Procedure Laterality Date   BIOPSY MASS LEFT FIRST METACARPAL   06/23/2006   benign   CATARACT EXTRACTION W/ INTRAOCULAR LENS  IMPLANT, BILATERAL  1999 and 2003   CERVICAL FUSION  02/2017   c1-c2 ; reports she has 2 screws 2in long in place done at [facility] ; patient exhibits VERY LIMITED NECK ROM   COLONOSCOPY     CYSTOSCOPY W/ RETROGRADES Bilateral 09/22/2016   Procedure: CYSTOSCOPY WITH RETROGRADE PYELOGRAM;  Surgeon: Alexis Frock, MD;  Location: [facility];  Service: Urology;  Laterality: Bilateral;   D & C HYSTEROSCOPY W/ RESECTION POLYP  07/11/2000   DILATION AND CURETTAGE OF UTERUS  1960's   EXCISION CYST AND DEBRIDEMENT RIGHT WIRST AND REMOVAL FORGEIGN BODY  01/09/2009   FLEXOR TENDON REPAIR Left 12/12/2018   Procedure: REPAIR/TRANSFER FLEXOR DIGITORUM PROFUNDUS OF LEFT SMALL FINGER;  Surgeon: Daryll Brod, MD;  Location: [place];  Service: Orthopedics;  Laterality: Left;   LAPAROSCOPY  yrs ago   infertility  METACARPOPHALANGEAL JOINT ARTHRODESIS Right 05/25/1996   TENDON REPAIR Left 01/15/2019   Hand   TONSILLECTOMY AND ADENOIDECTOMY  child   TOTAL HIP ARTHROPLASTY Left 07/07/2016   Procedure: LEFT TOTAL HIP ARTHROPLASTY ANTERIOR APPROACH;  Surgeon: Gaynelle Arabian, MD;  Location: [facility];  Service: Orthopedics;  Laterality: Left;   TOTAL HIP ARTHROPLASTY Right 09/28/2017   Procedure: RIGHT TOTAL HIP ARTHROPLASTY ANTERIOR APPROACH;  Surgeon: Gaynelle Arabian, MD;  Location: [facility];  Service: Orthopedics;  Laterality: Right;   TOTAL KNEE ARTHROPLASTY Right 07/30/2019   Procedure: TOTAL KNEE ARTHROPLASTY;  Surgeon: Gaynelle Arabian, MD;  Location: [facility];  Service: Orthopedics;  Laterality: Right;  78mn   TRANSURETHRAL RESECTION OF BLADDER TUMOR N/A 09/22/2016   Procedure: TRANSURETHRAL RESECTION OF BLADDER TUMOR (TURBT);   Surgeon: TAlexis Frock MD;  [facility]  Service: Urology;  Laterality: N/A;   Patient Active Problem List   Diagnosis Date Noted   Dyspnea 08/02/2022   Insomnia 08/02/2022   Hyponatremia 08/01/2022   Shoulder pain, left 09/14/2021   History of COVID-19 01/06/2021   Diarrhea 09/03/2020   Vitamin D deficiency 09/03/2020   OA (osteoarthritis) of knee 07/30/2019   Tinea corporis 06/08/2019   [place] 04/02/2019   Anemia 03/13/2018   Hot flashes 03/13/2018   S/P cervical spinal fusion 03/31/2017   Neck pain 12/12/2016   Depression with anxiety 10/17/2016   [place]adder cancer ([place] 10/17/2016   OA (osteoarthritis) of hip 07/07/2016   Spinal stenosis of lumbar region with radiculopathy 04/08/2016   Spondylolisthesis of lumbar region 04/08/2016   Spondylosis of cervical region without myelopathy or radiculopathy 04/08/2016   Preventative health care 01/18/2016   Low back pain 01/18/2016   History of chicken pox    Hyperlipidemia 12/15/2014   Allergic rhinitis 06/25/2014   Allergy to bee sting 06/10/2014   TMJ tenderness 11/16/2013   Other malaise and fatigue 05/07/2013   [place]th[place] of cervical [place] 11/27/2012   Muscle spasms of neck 11/27/2012   Parkinson's disease 05/24/2012   Conjunctivochalasis 03/09/2012   Epiretinal membrane 03/09/2012   Hyperopia with astigmatism and presbyopia 03/09/2012   Pseudophakia 03/09/2012   HTN (hypertension) 08/31/2011   Right knee pain 08/31/2011   Benign hypertensive heart disease without heart failure 05/12/2011    PCP: BMosie Lukes MD  REFERRING PROVIDER: SEldridge Abrahams MD   REFERRING DIAG: G20.A1 (ICD-10-CM) - Parkinson's disease without dyskinesia, without mention of fluctuations  THERAPY DIAG:  Other abnormalities of gait and mobility  Unsteadiness on feet  Muscle weakness (generalized)  Cervicalgia  RATIONALE FOR EVALUATION AND TREATMENT: Rehabilitation  ONSET DATE:  Diagnosed with PD in ~2012  NEXT MD VISIT: 6 months with PA   SUBJECTIVE:      SUBJECTIVE STATEMENT: [name] she continues to have pain in her L shoulder that prevents her from sleeping well at night - scheduled for a reverse TSA on 10/14/22. She also notes DOE with walking. Dr. BCharlett Blakenoted some abnormality on her EKG so she wants to see a cardiologist before she goes forward with the shoulder surgery. She also notes more difficulty with walking recently - her legs feel harder to move which she thinks may contribute to the DOE. She saw her PD neurologist last who gave her a referral for PT to address her Parkinson's to improve her activity and mobility tolerance prior to her shoulder surgery. [facility]'s most recent PT episode for PD was 10/2021 - 02/2022 at which time she had also been referred for  multiple diagnoses including L shoulder pain, LBP, L upper back and neck pain, psoriatic arthritis in addition to Parkinson's disease. Today, she notes pain in her neck on the L side which seems to be getting worse recently.  Pt accompanied by: self  PAIN:  Are you having pain? Yes: NPRS scale: 3/10 currently, but with some movements up to 10/10 Pain location: L side of neck Pain description: constant dull ache; sharp, shooting when more intense extending into L upper shoulder Aggravating factors: turning head to R, sleeping position Relieving factors: nothing  PERTINENT HISTORY:  R shoulder advanced OA - scheduled for reverse TSA on 10/14/22; R TKA 07/2019, B THR 2018 & 2017, posterior C1-2 cervical fusion 2018, rupture of flexor tendon of L hand with failed surgical repair 2020, Parkinson's, bladder cancer, HTN   PRECAUTIONS: None  WEIGHT BEARING RESTRICTIONS No  FALLS: Has patient fallen in last 6 months? No  LIVING ENVIRONMENT: Lives with: lives alone Lives in: House/apartment Stairs: Yes: External: 4 steps; on right going up, on left going up, and can reach both to garage Has  following equipment at home: Single point cane, Walker - 2 wheeled, shower chair, and Grab bars  OCCUPATION: Retired  PLOF: Independent and Leisure: daily stretching/exercises ~20 min/day; walk 1/4 mile daily; active in church - bible study    PATIENT GOALS: "To be be able to walk safely after my shoulder surgery."   OBJECTIVE:   DIAGNOSTIC FINDINGS:  N/A  COGNITION: Overall cognitive status: Within functional limits for tasks assessed   SENSATION: WFL  COORDINATION: WFL other than limitations in motion due to joint stiffness  PALPATION: Increased muscle tension and TTP in L>R SCM, cervical paraspinals, scalenes, UT and LS   CERVICAL ROM:  (To be formally assessed next visit)  Active ROM AROM  eval  Flexion   Extension   Right lateral flexion   Left lateral flexion   Right rotation   Left rotation    (Blank rows = not tested)  UPPER EXTREMITY ROM: (To be formally assessed next visit)  Active ROM Right eval Left eval  Shoulder flexion    Shoulder extension    Shoulder abduction    Shoulder adduction    Shoulder internal rotation    Shoulder external rotation    Elbow flexion    Elbow extension    Wrist flexion    Wrist extension    Wrist ulnar deviation    Wrist radial deviation    Wrist pronation    Wrist supination     (Blank rows = not tested)  UPPER EXTREMITY MMT: (To be formally assessed next visit)  MMT Right eval Left eval  Shoulder flexion    Shoulder extension    Shoulder abduction    Shoulder adduction    Shoulder internal rotation    Shoulder external rotation    Middle trapezius    Lower trapezius    Elbow flexion    Elbow extension    Wrist flexion    Wrist extension    Wrist ulnar deviation    Wrist radial deviation    Wrist pronation    Wrist supination    Grip strength     (Blank rows = not tested)  CERVICAL SPECIAL TESTS:  Spurling's test: Negative and Distraction test: Negative  MUSCLE LENGTH: Hamstrings: mod  tight B ITB: mild/mod tight R>L Piriformis: mod tight B Hip flexors: mild/mod tight B Quads: mild/mod tight B Heelcord: WFL  POSTURE:  rounded shoulders, forward head, decreased thoracic  kyphosis, weight shift right, and scoliosis with R lateral shift of head and shoulders  LOWER EXTREMITY ROM:    Limited B hip ER but otherwise WFL  LOWER EXTREMITY MMT:    MMT Right Eval Left Eval  Hip flexion 4 4  Hip extension 4 4  Hip abduction 4 4  Hip adduction 4 4  Hip internal rotation 4+ 4+  Hip external rotation 4+ 4+  Knee flexion 5 5  Knee extension 5 5  Ankle dorsiflexion 4+ 4+  Ankle plantarflexion 5 5  Ankle inversion    Ankle eversion    (Blank rows = not tested)  BED MOBILITY:  Sit to supine Complete Independence Supine to sit Complete Independence Rolling to Right Complete Independence Rolling to Left Complete Independence  TRANSFERS: Assistive device utilized: None  Sit to stand: Complete Independence Stand to sit: Complete Independence Chair to chair: Complete Independence Floor:  NT  GAIT: Gait pattern: decreased arm swing- Right, decreased arm swing- Left, lateral lean- Right, and decreased trunk rotation Distance walked: 120 Assistive device utilized: None Level of assistance: Complete Independence Comments: Gait speed = 3.17 ft/sec  RAMP: Level of Assistance:  NT Assistive device utilized: None Ramp Comments:   CURB:  Level of Assistance:  NT Assistive device utilized: None Curb Comments:   STAIRS:  Level of Assistance: SBA  Stair Negotiation Technique: Alternating Pattern  with Single Rail on Right  Number of Stairs: 14   Height of Stairs: 7   FUNCTIONAL TESTS:  5 times sit to stand: 16.23 sec; >16 sec indicates fall risk with PD, >15 sec indicates risk for recurrent falls with geriatric population Timed up and go (TUG): 10.78 sec (Normal), 10.59 sec (Manual), 12.25 sec (Cognitive) 10 meter walk test: 10.34 sec (pt noting DOE), Gait speed:  3.17 ft/sec  Berg Balance Scale: 51/56; 46-51 moderate fall risk (>50%)  Functional gait assessment: 19/30; 19-24 = medium risk fall  Baselines as of D/C from last PT episode (02/2022): 5 times sit to stand: 12.53 sec Timed up and go (TUG): 8.88 sec (Normal) 10 meter walk test: 8.82 sec; Gait speed: 3.72 ft/sec  Berg Balance Scale: 52/56 Functional gait assessment: 26/30  PATIENT SURVEYS:  ABC scale 1470 / 1600 = 91.9 %   TODAY'S TREATMENT:  08/25/22 THERAPEUTIC EXERCISE: to improve flexibility, strength and mobility.  Verbal and tactile cues throughout for technique. Cervical retraction/chin tuck 10 x 5" L SCM stretch x 30" L UT/scalenes stretch x 30"   PATIENT EDUCATION: Education details: PT eval findings, anticipated POC, and initial HEP Person educated: Patient Education method: Explanation, Demonstration, Tactile cues, Verbal cues, and Handouts Education comprehension: verbalized understanding, returned demonstration, verbal cues required, tactile cues required, and needs further education   HOME EXERCISE PROGRAM: Access Code: [email or web address] URL: [email or web address] Date: 08/25/2022 Prepared by: Annie Paras  Exercises - Seated Cervical Retraction  - 2-3 x daily - 7 x weekly - 2 sets - 10 reps - 3-5 sec hold - Sternocleidomastoid Stretch (Mirrored)  - 2-3 x daily - 7 x weekly - 3 reps - 30 sec hold - Seated Scalene Stretch with Towel (Mirrored)  - 2-3 x daily - 7 x weekly - 3 reps - 30 sec hold - Seated Hamstring Stretch  - 2 x daily - 7 x weekly - 3 reps - 30 sec hold   ASSESSMENT:  CLINICAL IMPRESSION: Stephanie [name] is an 64 y.o. y.o. female who was seen today for physical therapy evaluation and treatment for Parkinson's Disease. She  reports she was initially diagnosed with PD in 2012 and feels that when she takes her meds on schedule she has been able to manage the PD symptoms pretty well but feels that the dyskinesia fatigues her and at times makes  her SOB. Deficits include L-sided neck pain, abnormal posture and gait, mild proximal LE weakness, and increased fall risk per standardized balance testing. 5xSTS indicating risk for recurrent falls and Berg and FGA indicate a moderate fall risk, however all versions of TUG are below the fall risk thresholds. Stephanie [name] is scheduled to undergo L reverse TSA on 10/14/22 and wants to make sure she is able to move around walk safely after surgery - she will benefit from skilled PT to address above deficits and improve safety and stability with gait and mobility to reduce her fall risk post-op.  OBJECTIVE IMPAIRMENTS: Abnormal gait, cardiopulmonary status limiting activity, decreased activity tolerance, decreased balance, decreased coordination, decreased endurance, decreased mobility, difficulty walking, decreased ROM, decreased strength, hypomobility, increased fascial restrictions, impaired perceived functional ability, increased muscle spasms, impaired flexibility, improper body mechanics, postural dysfunction, and pain.   ACTIVITY LIMITATIONS: carrying, lifting, sleeping, bed mobility, reach over head, and locomotion level  PARTICIPATION LIMITATIONS: meal prep, cleaning, laundry, shopping, and community activity  PERSONAL FACTORS: Age, Past/current experiences, Time since onset of injury/illness/exacerbation, and 3+ comorbidities: R shoulder advanced OA - scheduled for reverse TSA on 10/14/22; R TKA 07/2019, B THR 2018 & 2017, posterior C1-2 cervical fusion 2018, rupture of flexor tendon of L hand with failed surgical repair 2020, Parkinson's, bladder cancer, HTN   are also affecting patient's functional outcome.   REHAB POTENTIAL: Good  CLINICAL DECISION MAKING: Evolving/moderate complexity  EVALUATION COMPLEXITY: Moderate   GOALS: Goals reviewed with patient? Yes  SHORT TERM GOALS: Target date: 09/15/2022   Patient will be independent with initial HEP. Baseline:  Goal status: INITIAL  2.   Patient will be educated on strategies to decrease risk of falls.  Baseline:  Goal status: INITIAL  LONG TERM GOALS: Target date: 10/06/2022   Patient will be independent with ongoing/advanced HEP for self-management at home incorporating PWR! Moves as indicated .  Baseline:  Goal status: INITIAL  2.  Patient will be able to ambulate 600' with LRAD with good safety to access community.  Baseline:  Goal status: INITIAL  3.  Patient will be able to step up/down curb safely with LRAD for safety with community ambulation.  Baseline:  Goal status: INITIAL   4.  Patient to improve cervical AROM by >/= 25% with pain dereased by >/= 50% with movement to allow for increased participation in church services and improve safety with driving   Baseline:  Goal status: INITIAL  5.  Patient will improve 5x STS time to </= 14.8 seconds to demonstrate improved functional strength and transfer efficiency per age appropriate norms. Baseline: 16.23 sec Goal status: INITIAL  6.  Patient will demonstrate at least 24/30 on FGA to improve gait stability and reduce risk for falls. (MCID = 4 points) Baseline: 19/30 Goal status: INITIAL  7.  Patient will improve Berg score to >/= 54/56 to improve safety and stability with ADLs in standing and reduce risk for falls.  Baseline: 51/56 Goal status: INITIAL  9.  Patient will maintain or improve score on ABC scale to demonstrate improved balance confidence and decreased risk for falls. Baseline: 1470 / 1600 = 91.9% Goal status: INITIAL  10. Patient will verbalize understanding of local Parkinson's disease community resources, including community fitness post  d/c. Baseline:  Goal status: INITIAL   PLAN: PT FREQUENCY: 2x/week  PT DURATION: 6 weeks  PLANNED INTERVENTIONS: Therapeutic exercises, Therapeutic activity, Neuromuscular re-education, Balance training, Gait training, Patient/Family education, Self Care, Joint mobilization, Dry Needling,  Electrical stimulation, Spinal mobilization, Cryotherapy, Moist heat, Taping, Ultrasound, Ionotophoresis '4mg'$ /ml Dexamethasone, Manual therapy, and Re-evaluation  PLAN FOR NEXT SESSION: Formally assess cervical ROM and UE ROM/strength; review initial HEP; gentle cervical ROM and stretching; MT +/- DN to address neck pain and abnormal muscle tension   Percival Spanish, PT 08/25/2022, 1:43 PM

## 2022-08-25 ENCOUNTER — Other Ambulatory Visit: Payer: Self-pay

## 2022-08-25 ENCOUNTER — Ambulatory Visit: Payer: Medicare Other | Admitting: Physical Therapy

## 2022-08-25 ENCOUNTER — Ambulatory Visit: Payer: Medicare Other | Attending: Neurology | Admitting: Physical Therapy

## 2022-08-25 ENCOUNTER — Encounter: Payer: Self-pay | Admitting: Physical Therapy

## 2022-08-25 DIAGNOSIS — M6281 Muscle weakness (generalized): Secondary | ICD-10-CM | POA: Insufficient documentation

## 2022-08-25 DIAGNOSIS — G20A1 Parkinson's disease without dyskinesia, without mention of fluctuations: Secondary | ICD-10-CM | POA: Diagnosis not present

## 2022-08-25 DIAGNOSIS — M542 Cervicalgia: Secondary | ICD-10-CM | POA: Diagnosis not present

## 2022-08-25 DIAGNOSIS — R2681 Unsteadiness on feet: Secondary | ICD-10-CM | POA: Insufficient documentation

## 2022-08-25 DIAGNOSIS — R2689 Other abnormalities of gait and mobility: Secondary | ICD-10-CM | POA: Insufficient documentation

## 2022-08-26 ENCOUNTER — Telehealth (HOSPITAL_COMMUNITY): Payer: Self-pay | Admitting: *Deleted

## 2022-08-26 NOTE — Telephone Encounter (Signed)
Patient given detailed instructions per Myocardial Perfusion Study Information Sheet for the test on 08/27/2022 at 10:00. Patient notified to arrive 15 minutes early and that it is imperative to arrive on time for appointment to keep from having the test rescheduled.  If you need to cancel or reschedule your appointment, please call the office within 24 hours of your appointment. . Patient verbalized understanding.Stephanie [name]

## 2022-08-27 ENCOUNTER — Ambulatory Visit (HOSPITAL_COMMUNITY): Payer: Medicare Other | Attending: Cardiology

## 2022-08-27 DIAGNOSIS — R0689 Other abnormalities of breathing: Secondary | ICD-10-CM | POA: Insufficient documentation

## 2022-08-27 DIAGNOSIS — Z791 Long term (current) use of non-steroidal anti-inflammatories (NSAID): Secondary | ICD-10-CM | POA: Insufficient documentation

## 2022-08-27 DIAGNOSIS — R0609 Other forms of dyspnea: Secondary | ICD-10-CM | POA: Insufficient documentation

## 2022-08-27 LAB — MYOCARDIAL PERFUSION IMAGING
LV dias vol: 33 mL (ref 46–106)
LV sys vol: 9 mL
Nuc Stress EF: 73 %
Peak HR: 106 {beats}/min
Rest HR: 82 {beats}/min
Rest Nuclear Isotope Dose: 10.2 mCi
SDS: 0
SRS: 0
SSS: 0
ST Depression (mm): 0 mm
Stress Nuclear Isotope Dose: 31.8 mCi
TID: 1.06

## 2022-08-27 MED ORDER — REGADENOSON 0.4 MG/5ML IV SOLN
0.4000 mg | Freq: Once | INTRAVENOUS | Status: AC
  Administered 2022-08-27: 0.4 mg via INTRAVENOUS

## 2022-08-27 MED ORDER — TECHNETIUM TC 99M TETROFOSMIN IV KIT
10.2000 | PACK | Freq: Once | INTRAVENOUS | Status: AC | PRN
  Administered 2022-08-27: 10.2 via INTRAVENOUS

## 2022-08-27 MED ORDER — TECHNETIUM TC 99M TETROFOSMIN IV KIT
31.8000 | PACK | Freq: Once | INTRAVENOUS | Status: AC | PRN
  Administered 2022-08-27: 31.8 via INTRAVENOUS

## 2022-08-30 ENCOUNTER — Encounter (HOSPITAL_BASED_OUTPATIENT_CLINIC_OR_DEPARTMENT_OTHER): Payer: Self-pay | Admitting: Cardiology

## 2022-09-01 ENCOUNTER — Other Ambulatory Visit (INDEPENDENT_AMBULATORY_CARE_PROVIDER_SITE_OTHER): Payer: Medicare Other

## 2022-09-01 ENCOUNTER — Telehealth: Payer: Self-pay | Admitting: Nurse Practitioner

## 2022-09-01 DIAGNOSIS — I10 Essential (primary) hypertension: Secondary | ICD-10-CM | POA: Diagnosis not present

## 2022-09-01 LAB — COMPREHENSIVE METABOLIC PANEL
ALT: 5 U/L (ref 0–35)
AST: 22 U/L (ref 0–37)
Albumin: 4.8 g/dL (ref 3.5–5.2)
Alkaline Phosphatase: 38 U/L — ABNORMAL LOW (ref 39–117)
BUN: 18 mg/dL (ref 6–23)
CO2: 31 mEq/L (ref 19–32)
Calcium: 9.6 mg/dL (ref 8.4–10.5)
Chloride: 91 mEq/L — ABNORMAL LOW (ref 96–112)
Creatinine, Ser: 0.9 mg/dL (ref 0.40–1.20)
GFR: 57.03 mL/min — ABNORMAL LOW (ref >60.00)
Glucose, Bld: 84 mg/dL (ref 70–99)
Potassium: 3.5 mEq/L (ref 3.5–5.1)
Sodium: 131 mEq/L — ABNORMAL LOW (ref 135–145)
Total Bilirubin: 1 mg/dL (ref 0.2–1.2)
Total Protein: 6.5 g/dL (ref 6.0–8.3)

## 2022-09-01 NOTE — Telephone Encounter (Signed)
Called and s/w patient and updated her regarding her NST results on 08/31/22. Overall low risk, normal study. Forwarded results to Richardson Dopp, PA who will see patient for follow-up next month.   She verbalized understanding and was appreciative of my call.   Finis Bud, NP

## 2022-09-02 ENCOUNTER — Encounter: Payer: Self-pay | Admitting: Physical Therapy

## 2022-09-02 ENCOUNTER — Other Ambulatory Visit: Payer: Self-pay

## 2022-09-02 ENCOUNTER — Ambulatory Visit: Payer: Medicare Other | Admitting: Physical Therapy

## 2022-09-02 DIAGNOSIS — M6281 Muscle weakness (generalized): Secondary | ICD-10-CM | POA: Diagnosis not present

## 2022-09-02 DIAGNOSIS — R2689 Other abnormalities of gait and mobility: Secondary | ICD-10-CM | POA: Diagnosis not present

## 2022-09-02 DIAGNOSIS — G20A1 Parkinson's disease without dyskinesia, without mention of fluctuations: Secondary | ICD-10-CM | POA: Diagnosis not present

## 2022-09-02 DIAGNOSIS — R2681 Unsteadiness on feet: Secondary | ICD-10-CM

## 2022-09-02 DIAGNOSIS — M542 Cervicalgia: Secondary | ICD-10-CM

## 2022-09-02 DIAGNOSIS — I10 Essential (primary) hypertension: Secondary | ICD-10-CM

## 2022-09-02 MED ORDER — HYDROCHLOROTHIAZIDE 25 MG PO TABS: 25.0000 mg | ORAL_TABLET | Freq: Every day | ORAL | 3 refills | Status: DC

## 2022-09-02 MED ORDER — HYDROCHLOROTHIAZIDE 25 MG PO TABS: 25.0000 mg | ORAL_TABLET | Freq: Every day | ORAL | 1 refills | Status: DC

## 2022-09-02 NOTE — Therapy (Signed)
OUTPATIENT PHYSICAL THERAPY TREATMENT   Patient Name: Stephanie [name] MRN: [id number] DOB:05/13/1932, 88 y.o., female Today's Date: 09/02/2022    PT End of Session - 09/02/22 1358     Visit Number 2    Date for PT Re-Evaluation 10/06/22    Authorization Type Medicare & BCBS    PT Start Time 1358    PT Stop Time 1444    PT Time Calculation (min) 46 min    Activity Tolerance Patient tolerated treatment well;Patient limited by pain    Behavior During Therapy [facility] for tasks assessed/performed              Past Medical History:  Diagnosis Date   Allergy    Anemia    past hx    Arthropathy of cervical spine 11/27/2012   Right  Xray at [facility]  On 04/08/2016  AP, lateral, and lateral flexion and extension views of the cervical spine are submitted for evaluation.   No acute fracture identified in the cervical spine.   There is redemonstration of approximately 2 mm of C3 on C4 anterolisthesis in neutral which slightly increases in flexion relative to neutral and extension. Slight C5 on C6 retrolisthesis does not change i   Benign hypertension without congestive heart failure    Benign mole    right hcets mole, bleeds when dried with towel   Bilateral dry eyes    Bladder cancer ([place]) 01-Nov-2016   Cervical spondylosis    Cervicalgia    2 screws in place    Chronic kidney disease    kidney stones    Depression with anxiety Nov 01, 2016   prior to husbands death    Gross hematuria    developed after first hip replacment, led to indicental finding of bladder tumor, bleeding now resolved    History of kidney stones    1970's   Left hand weakness    due to reinjury of flxor tendon s/p tendon surgery march 12-2018   Lumbar stenosis    Malignant tumor of urinary bladder ([place]) 07/2016   Pre-stage I   OA (osteoarthritis)    Parkinson disease dx 2012   neurologist-  dr siddiqui at [place] [facility])     Dr Trudie Reed , [place] [facility]    Rupture of flexor tendon of hand 2020    right   Past Surgical History:  Procedure Laterality Date   BIOPSY MASS LEFT FIRST METACARPAL   06/23/2006   benign   CATARACT EXTRACTION W/ INTRAOCULAR LENS  IMPLANT, BILATERAL  1999 and 2003   CERVICAL FUSION  02/2017   c1-c2 ; reports she has 2 screws 2in long in place done at [facility] ; patient exhibits VERY LIMITED NECK ROM   COLONOSCOPY     CYSTOSCOPY W/ RETROGRADES Bilateral 09/22/2016   Procedure: CYSTOSCOPY WITH RETROGRADE PYELOGRAM;  Surgeon: Alexis Frock, MD;  Location: [facility];  Service: Urology;  Laterality: Bilateral;   D & C HYSTEROSCOPY W/ RESECTION POLYP  07/11/2000   DILATION AND CURETTAGE OF UTERUS  1960's   EXCISION CYST AND DEBRIDEMENT RIGHT WIRST AND REMOVAL FORGEIGN BODY  01/09/2009   FLEXOR TENDON REPAIR Left 12/12/2018   Procedure: REPAIR/TRANSFER FLEXOR DIGITORUM PROFUNDUS OF LEFT SMALL FINGER;  Surgeon: Daryll Brod, MD;  Location: [place];  Service: Orthopedics;  Laterality: Left;   LAPAROSCOPY  yrs ago   infertility    METACARPOPHALANGEAL JOINT ARTHRODESIS Right 05/25/1996   TENDON REPAIR Left 01/15/2019   Hand  TONSILLECTOMY AND ADENOIDECTOMY  child   TOTAL HIP ARTHROPLASTY Left 07/07/2016   Procedure: LEFT TOTAL HIP ARTHROPLASTY ANTERIOR APPROACH;  Surgeon: Gaynelle Arabian, MD;  Location: [facility];  Service: Orthopedics;  Laterality: Left;   TOTAL HIP ARTHROPLASTY Right 09/28/2017   Procedure: RIGHT TOTAL HIP ARTHROPLASTY ANTERIOR APPROACH;  Surgeon: Gaynelle Arabian, MD;  Location: [facility];  Service: Orthopedics;  Laterality: Right;   TOTAL KNEE ARTHROPLASTY Right 07/30/2019   Procedure: TOTAL KNEE ARTHROPLASTY;  Surgeon: Gaynelle Arabian, MD;  Location: [facility];  Service: Orthopedics;  Laterality: Right;  79mn   TRANSURETHRAL RESECTION OF BLADDER TUMOR N/A 09/22/2016   Procedure: TRANSURETHRAL RESECTION OF BLADDER TUMOR (TURBT);  Surgeon: TAlexis Frock MD;  [facility]  Service: Urology;   Laterality: N/A;   Patient Active Problem List   Diagnosis Date Noted   Dyspnea 08/02/2022   Insomnia 08/02/2022   Hyponatremia 08/01/2022   Shoulder pain, left 09/14/2021   History of COVID-19 01/06/2021   Diarrhea 09/03/2020   Vitamin D deficiency 09/03/2020   OA (osteoarthritis) of knee 07/30/2019   Tinea corporis 06/08/2019   [place] 04/02/2019   Anemia 03/13/2018   Hot flashes 03/13/2018   S/P cervical spinal fusion 03/31/2017   Neck pain 12/12/2016   Depression with anxiety 10/17/2016   [place] 10/17[place]   OA (osteoarthritis) of hip 07/07/2016   Spinal stenosis of lumbar region with radiculopathy 04/08/2016   Spondylolisthesis of lumbar region 04/08/2016   Spondylosis of cervical region without myelopathy or radiculopathy 04/08/2016   Preventative health care 01/18/2016   Low back pain 01/18/2016   History of chicken pox    Hyperlipidemia 12/15/2014   Allergic rhinitis 06/25/2014   Allergy to bee sting 06/10/2014   TMJ tenderness 11/16/2013   Other malaise and fatigue 05/07/2013   Arthropathy of cervical spine ([place] 11/27/2012   Muscle spasms of neck 11/27/2012   Parkinson's disease 05/24/2012   Conjunctivochalasis 03/09/2012   Epiretinal membrane 03/09/2012   Hyperopia with astigmatism and presbyopia 03/09/2012   Pseudophakia 03/09/2012   HTN (hypertension) 08/31/2011   Right knee pain 08/31/2011   Benign hypertensive heart disease without heart failure 05/12/2011    PCP: BMosie Lukes MD  REFERRING PROVIDER: SEldridge Abrahams MD   REFERRING DIAG: G20.A1 (ICD-10-CM) - Parkinson's disease without dyskinesia, without mention of fluctuations  THERAPY DIAG:  Other abnormalities of gait and mobility  Unsteadiness on feet  Muscle weakness (generalized)  Cervicalgia  RATIONALE FOR EVALUATION AND TREATMENT: Rehabilitation  ONSET DATE: Diagnosed with PD in ~2012  NEXT MD VISIT: 6 months with PA   SUBJECTIVE:       SUBJECTIVE STATEMENT: Pt reports continued tightness and limited motion in her neck, stating she is not able to go far with her stretches.  PAIN:  Are you having pain? Yes: NPRS scale: 4-5/10 Pain location: L side of neck Pain description: constant dull ache; sharp, shooting when more intense extending into L upper shoulder Aggravating factors: turning head to R, sleeping position Relieving factors: nothing  PERTINENT HISTORY:  R shoulder advanced OA - scheduled for reverse TSA on 10/14/22; R TKA 07/2019, B THR 2018 & 2017, posterior C1-2 cervical fusion 2018, rupture of flexor tendon of L hand with failed surgical repair 2020, Parkinson's, bladder cancer, HTN   PRECAUTIONS: None  WEIGHT BEARING RESTRICTIONS No  FALLS: Has patient fallen in last 6 months? No  LIVING ENVIRONMENT: Lives with: lives alone Lives in: House/apartment Stairs: Yes: External: 4 steps; on right  going up, on left going up, and can reach both to garage Has following equipment at home: Single point cane, Walker - 2 wheeled, shower chair, and Grab bars  OCCUPATION: Retired  PLOF: Independent and Leisure: daily stretching/exercises ~20 min/day; walk 1/4 mile daily; active in church - bible study    PATIENT GOALS: "To be be able to walk safely after my shoulder surgery."   OBJECTIVE:   DIAGNOSTIC FINDINGS:  N/A  COGNITION: Overall cognitive status: Within functional limits for tasks assessed   SENSATION: WFL  COORDINATION: WFL other than limitations in motion due to joint stiffness  PALPATION: Increased muscle tension and TTP in L>R SCM, cervical paraspinals, scalenes, UT and LS   CERVICAL ROM:    Active ROM AROM  09/02/22  Flexion 34  Extension 35  Right lateral flexion 21  Left lateral flexion 20  Right rotation 30  Left rotation 29   (Blank rows = not tested)  UPPER EXTREMITY ROM:   Active ROM Right 09/02/22 Left 09/02/22  Shoulder flexion 168 53  Shoulder extension    Shoulder  abduction 179 51  Shoulder adduction    Shoulder internal rotation    Shoulder external rotation 69 39  Elbow flexion    Elbow extension    Wrist flexion    Wrist extension    Wrist ulnar deviation    Wrist radial deviation    Wrist pronation    Wrist supination     (Blank rows = not tested)  UPPER EXTREMITY MMT:   MMT Right 09/02/22 Left NT d/t ROM restrictions  Shoulder flexion 4+   Shoulder extension 4+   Shoulder abduction 4+   Shoulder adduction    Shoulder internal rotation 5   Shoulder external rotation 4+   Middle trapezius    Lower trapezius    Elbow flexion    Elbow extension    Wrist flexion    Wrist extension    Wrist ulnar deviation    Wrist radial deviation    Wrist pronation    Wrist supination    Grip strength     (Blank rows = not tested)  CERVICAL SPECIAL TESTS:  Spurling's test: Negative and Distraction test: Negative  MUSCLE LENGTH: Hamstrings: mod tight B ITB: mild/mod tight R>L Piriformis: mod tight B Hip flexors: mild/mod tight B Quads: mild/mod tight B Heelcord: WFL  POSTURE:  rounded shoulders, forward head, decreased thoracic kyphosis, weight shift right, and scoliosis with R lateral shift of head and shoulders  LOWER EXTREMITY ROM:    Limited B hip ER but otherwise WFL  LOWER EXTREMITY MMT:    MMT Right Eval Left Eval  Hip flexion 4 4  Hip extension 4 4  Hip abduction 4 4  Hip adduction 4 4  Hip internal rotation 4+ 4+  Hip external rotation 4+ 4+  Knee flexion 5 5  Knee extension 5 5  Ankle dorsiflexion 4+ 4+  Ankle plantarflexion 5 5  Ankle inversion    Ankle eversion    (Blank rows = not tested)  BED MOBILITY:  Sit to supine Complete Independence Supine to sit Complete Independence Rolling to Right Complete Independence Rolling to Left Complete Independence  TRANSFERS: Assistive device utilized: None  Sit to stand: Complete Independence Stand to sit: Complete Independence Chair to chair: Complete  Independence Floor:  NT  GAIT: Gait pattern: decreased arm swing- Right, decreased arm swing- Left, lateral lean- Right, and decreased trunk rotation Distance walked: 120 Assistive device utilized: None Level of assistance:  Complete Independence Comments: Gait speed = 3.17 ft/sec  RAMP: Level of Assistance:  NT Assistive device utilized: None Ramp Comments:   CURB:  Level of Assistance:  NT Assistive device utilized: None Curb Comments:   STAIRS:  Level of Assistance: SBA  Stair Negotiation Technique: Alternating Pattern  with Single Rail on Right  Number of Stairs: 14   Height of Stairs: 7   FUNCTIONAL TESTS:  5 times sit to stand: 16.23 sec; >16 sec indicates fall risk with PD, >15 sec indicates risk for recurrent falls with geriatric population Timed up and go (TUG): 10.78 sec (Normal), 10.59 sec (Manual), 12.25 sec (Cognitive) 10 meter walk test: 10.34 sec (pt noting DOE), Gait speed: 3.17 ft/sec  Berg Balance Scale: 51/56; 46-51 moderate fall risk (>50%)  Functional gait assessment: 19/30; 19-24 = medium risk fall  Baselines as of D/C from last PT episode (02/2022): 5 times sit to stand: 12.53 sec Timed up and go (TUG): 8.88 sec (Normal) 10 meter walk test: 8.82 sec; Gait speed: 3.72 ft/sec  Berg Balance Scale: 52/56 Functional gait assessment: 26/30  PATIENT SURVEYS:  ABC scale 1470 / 1600 = 91.9 %   TODAY'S TREATMENT:  09/02/22 THERAPEUTIC EXERCISE: to improve flexibility, strength and mobility.  Verbal and tactile cues throughout for technique. NuStep - L4 x 6 min (B LE & R UE) L SCM stretch 2 x 30" - focusing on just cervical extension component L UT/scalenes stretch with towel over shoulder 2 x 30 - focusing on just lateral flexion/side-bending component Cervical retraction/chin tuck 10 x 5" Cervical retraction/chin tuck + R/L lateral flexion/side-bending 10 x 3" to each side Cervical retraction/chin tuck + R/L cervical rotation 10 x 3" - more  difficulty/discomfort with R rotation  MANUAL THERAPY: To promote normalized muscle tension, improved flexibility, improved joint mobility, increased ROM, and reduced pain. Gentle STM/DTM, manual TPR and pin & stretch to L UT and scalenes - very TTP  THERAPEUTIC ACTIVITIES: Cervical and UE ROM and MMT assessment - refer to above tables   08/25/22 THERAPEUTIC EXERCISE: to improve flexibility, strength and mobility.  Verbal and tactile cues throughout for technique. Cervical retraction/chin tuck 10 x 5" L SCM stretch x 30" L UT/scalenes stretch x 30"   PATIENT EDUCATION: Education details: PT eval findings, anticipated POC, and initial HEP Person educated: Patient Education method: Explanation, Demonstration, Tactile cues, Verbal cues, and Handouts Education comprehension: verbalized understanding, returned demonstration, verbal cues required, tactile cues required, and needs further education   HOME EXERCISE PROGRAM: Access Code: [email or web address] URL: [email or web address] Date: 08/25/2022 Prepared by: Annie Paras  Exercises - Seated Cervical Retraction  - 2-3 x daily - 7 x weekly - 2 sets - 10 reps - 3-5 sec hold - Sternocleidomastoid Stretch (Mirrored)  - 2-3 x daily - 7 x weekly - 3 reps - 30 sec hold - Seated Scalene Stretch with Towel (Mirrored)  - 2-3 x daily - 7 x weekly - 3 reps - 30 sec hold - Seated Hamstring Stretch  - 2 x daily - 7 x weekly - 3 reps - 30 sec hold   ASSESSMENT:  CLINICAL IMPRESSION: Lakecia reports limited motion when she attempts her HEP stretches if she tries to avoid the painful ROM - reviewed the cervical stretches and exercises, isolating single plane motions for now while continuing to avoid pushing into painful ROM. Attempted manual STM, TPR and MFR with limited tolerance due to pt very TTP in L UT and scalenes. Cervical and shoulder ROM assessed with significant/major  declines noted as compared to available motion as of discharge from PT  in April 2023. Reviewed cervical retraction and introduced gentle lateral flexion and rotation in combination with retraction with pt noting slightly better tolerance for movement. Will plan to continue initial focus on addressing neck/upper shoulder pain and progress to Parkinson's related movement and strengthening as pain better controlled.  OBJECTIVE IMPAIRMENTS: Abnormal gait, cardiopulmonary status limiting activity, decreased activity tolerance, decreased balance, decreased coordination, decreased endurance, decreased mobility, difficulty walking, decreased ROM, decreased strength, hypomobility, increased fascial restrictions, impaired perceived functional ability, increased muscle spasms, impaired flexibility, improper body mechanics, postural dysfunction, and pain.   ACTIVITY LIMITATIONS: carrying, lifting, sleeping, bed mobility, reach over head, and locomotion level  PARTICIPATION LIMITATIONS: meal prep, cleaning, laundry, shopping, and community activity  PERSONAL FACTORS: Age, Past/current experiences, Time since onset of injury/illness/exacerbation, and 3+ comorbidities: R shoulder advanced OA - scheduled for reverse TSA on 10/14/22; R TKA 07/2019, B THR 2018 & 2017, posterior C1-2 cervical fusion 2018, rupture of flexor tendon of L hand with failed surgical repair 2020, Parkinson's, bladder cancer, HTN   are also affecting patient's functional outcome.   REHAB POTENTIAL: Good  CLINICAL DECISION MAKING: Evolving/moderate complexity  EVALUATION COMPLEXITY: Moderate   GOALS: Goals reviewed with patient? Yes  SHORT TERM GOALS: Target date: 09/15/2022   Patient will be independent with initial HEP. Baseline:  Goal status: IN PROGRESS  2.  Patient will be educated on strategies to decrease risk of falls.  Baseline:  Goal status: IN PROGRESS  LONG TERM GOALS: Target date: 10/06/2022   Patient will be independent with ongoing/advanced HEP for self-management at home incorporating  PWR! Moves as indicated .  Baseline:  Goal status: IN PROGRESS  2.  Patient will be able to ambulate 600' with LRAD with good safety to access community.  Baseline:  Goal status: IN PROGRESS  3.  Patient will be able to step up/down curb safely with LRAD for safety with community ambulation.  Baseline:  Goal status: IN PROGRESS   4.  Patient to improve cervical AROM by >/= 25% with pain dereased by >/= 50% with movement to allow for increased participation in church services and improve safety with driving   Baseline:  Goal status: IN PROGRESS  5.  Patient will improve 5x STS time to </= 14.8 seconds to demonstrate improved functional strength and transfer efficiency per age appropriate norms. Baseline: 16.23 sec Goal status: IN PROGRESS  6.  Patient will demonstrate at least 24/30 on FGA to improve gait stability and reduce risk for falls. (MCID = 4 points) Baseline: 19/30 Goal status: IN PROGRESS  7.  Patient will improve Berg score to >/= 54/56 to improve safety and stability with ADLs in standing and reduce risk for falls.  Baseline: 51/56 Goal status: IN PROGRESS  9.  Patient will maintain or improve score on ABC scale to demonstrate improved balance confidence and decreased risk for falls. Baseline: 1470 / 1600 = 91.9% Goal status: IN PROGRESS  10. Patient will verbalize understanding of local Parkinson's disease community resources, including community fitness post d/c. Baseline:  Goal status: IN PROGRESS   PLAN: PT FREQUENCY: 2x/week  PT DURATION: 6 weeks  PLANNED INTERVENTIONS: Therapeutic exercises, Therapeutic activity, Neuromuscular re-education, Balance training, Gait training, Patient/Family education, Self Care, Joint mobilization, Dry Needling, Electrical stimulation, Spinal mobilization, Cryotherapy, Moist heat, Taping, Ultrasound, Ionotophoresis '4mg'$ /ml Dexamethasone, Manual therapy, and Re-evaluation  PLAN FOR NEXT SESSION:  gentle cervical ROM and  stretching; MT +/- DN to  address neck pain and abnormal muscle tension; review/update HEP PRN; progress to LE strengthening and PD related movements as neck pain better controlled   Percival Spanish, PT 09/02/2022, 3:04 PM

## 2022-09-08 ENCOUNTER — Ambulatory Visit: Payer: Medicare Other | Admitting: Physical Therapy

## 2022-09-08 ENCOUNTER — Encounter: Payer: Self-pay | Admitting: Physical Therapy

## 2022-09-08 DIAGNOSIS — R2681 Unsteadiness on feet: Secondary | ICD-10-CM | POA: Diagnosis not present

## 2022-09-08 DIAGNOSIS — G20A1 Parkinson's disease without dyskinesia, without mention of fluctuations: Secondary | ICD-10-CM | POA: Diagnosis not present

## 2022-09-08 DIAGNOSIS — R2689 Other abnormalities of gait and mobility: Secondary | ICD-10-CM

## 2022-09-08 DIAGNOSIS — M542 Cervicalgia: Secondary | ICD-10-CM | POA: Diagnosis not present

## 2022-09-08 DIAGNOSIS — M6281 Muscle weakness (generalized): Secondary | ICD-10-CM | POA: Diagnosis not present

## 2022-09-08 NOTE — Therapy (Signed)
OUTPATIENT PHYSICAL THERAPY TREATMENT   Patient Name: ALEANNA MENGE MRN: [id number] DOB:Jan 11, 1932, 88 y.o., female Today's Date: 09/08/2022    PT End of Session - 09/08/22 0959     Visit Number 3    Date for PT Re-Evaluation 10/06/22    Authorization Type Medicare & BCBS    PT Start Time [phone]    PT Stop Time 1047    PT Time Calculation (min) 48 min    Activity Tolerance Patient tolerated treatment well    Behavior During Therapy [facility] for tasks assessed/performed              Past Medical History:  Diagnosis Date   Allergy    Anemia    past hx    Arthropathy of cervical spine 11/27/2012   Right  Xray at [facility]  On 04/08/2016  AP, lateral, and lateral flexion and extension views of the cervical spine are submitted for evaluation.   No acute fracture identified in the cervical spine.   There is redemonstration of approximately 2 mm of C3 on C4 anterolisthesis in neutral which slightly increases in flexion relative to neutral and extension. Slight C5 on C6 retrolisthesis does not change i   Benign hypertension without congestive heart failure    Benign mole    right hcets mole, bleeds when dried with towel   Bilateral dry eyes    Bladder cancer ([place]) 22-Oct-2016   Cervical spondylosis    Cervicalgia    2 screws in place    Chronic kidney disease    kidney stones    Depression with anxiety 2016/10/22   prior to husbands death    Gross hematuria    developed after first hip replacment, led to indicental finding of bladder tumor, bleeding now resolved    History of kidney stones    1970's   Left hand weakness    due to reinjury of flxor tendon s/p tendon surgery march 12-2018   Lumbar stenosis    Malignant tumor of urinary bladder ([place]) 07/2016   Pre-stage I   OA (osteoarthritis)    Parkinson disease dx 2012   neurologist-  dr siddiqui at [facility])     Dr Trudie Reed , [place] [facility]    Rupture of flexor tendon of hand 2020   right   Past Surgical  History:  Procedure Laterality Date   BIOPSY MASS LEFT FIRST METACARPAL   06/23/2006   benign   CATARACT EXTRACTION W/ INTRAOCULAR LENS  IMPLANT, BILATERAL  1999 and 2003   CERVICAL FUSION  02/2017   c1-c2 ; reports she has 2 screws 2in long in place done at [facility] ; patient exhibits VERY LIMITED NECK ROM   COLONOSCOPY     CYSTOSCOPY W/ RETROGRADES Bilateral 09/22/2016   Procedure: CYSTOSCOPY WITH RETROGRADE PYELOGRAM;  Surgeon: Alexis Frock, MD;  Location: [facility];  Service: Urology;  Laterality: Bilateral;   D & C HYSTEROSCOPY W/ RESECTION POLYP  07/11/2000   DILATION AND CURETTAGE OF UTERUS  1960's   EXCISION CYST AND DEBRIDEMENT RIGHT WIRST AND REMOVAL FORGEIGN BODY  01/09/2009   FLEXOR TENDON REPAIR Left 12/12/2018   Procedure: REPAIR/TRANSFER FLEXOR DIGITORUM PROFUNDUS OF LEFT SMALL FINGER;  Surgeon: Daryll Brod, MD;  Location: [place];  Service: Orthopedics;  Laterality: Left;   LAPAROSCOPY  yrs ago   infertility    METACARPOPHALANGEAL JOINT ARTHRODESIS Right 05/25/1996   TENDON REPAIR Left 01/15/2019   Hand   TONSILLECTOMY  AND ADENOIDECTOMY  child   TOTAL HIP ARTHROPLASTY Left 07/07/2016   Procedure: LEFT TOTAL HIP ARTHROPLASTY ANTERIOR APPROACH;  Surgeon: Gaynelle Arabian, MD;  Location: [facility];  Service: Orthopedics;  Laterality: Left;   TOTAL HIP ARTHROPLASTY Right 09/28/2017   Procedure: RIGHT TOTAL HIP ARTHROPLASTY ANTERIOR APPROACH;  Surgeon: Gaynelle Arabian, MD;  Location: [facility];  Service: Orthopedics;  Laterality: Right;   TOTAL KNEE ARTHROPLASTY Right 07/30/2019   Procedure: TOTAL KNEE ARTHROPLASTY;  Surgeon: Gaynelle Arabian, MD;  Location: [facility];  Service: Orthopedics;  Laterality: Right;  46mn   TRANSURETHRAL RESECTION OF BLADDER TUMOR N/A 09/22/2016   Procedure: TRANSURETHRAL RESECTION OF BLADDER TUMOR (TURBT);  Surgeon: TAlexis Frock MD;  [facility]  Service: Urology;  Laterality: N/A;   Patient  Active Problem List   Diagnosis Date Noted   Dyspnea 08/02/2022   Insomnia 08/02/2022   Hyponatremia 08/01/2022   Shoulder pain, left 09/14/2021   History of COVID-19 01/06/2021   Diarrhea 09/03/2020   Vitamin D deficiency 09/03/2020   OA (osteoarthritis) of knee 07/30/2019   Tinea corporis 06/08/2019   Psoriatic arthritis ([place] 04/02/2019   Anemia 03/13/2018   Hot flashes 03/13/2018   S/P cervical spinal fusion 03/31/2017   Neck pain 12/12/2016   Depression with anxiety 10/17/2016   [place] 10/17/2016   OA (osteoarthritis) of hip 07/07/2016   Spinal stenosis of lumbar region with radiculopathy 04/08/2016   Spondylolisthesis of lumbar region 04/08/2016   Spondylosis of cervical region without myelopathy or radiculopathy 04/08/2016   Preventative health care 01/18/2016   Low back pain 01/18/2016   History of chicken pox    Hyperlipidemia 12/15/2014   Allergic rhinitis 06/25/2014   Allergy to bee sting 06/10/2014   TMJ tenderness 11/16/2013   Other malaise and fatigue 05/07/2013   Arthropathy of cervical spine ([place] 11/27/2012   Muscle spasms of neck 11/27/2012   Parkinson's disease 05/24/2012   Conjunctivochalasis 03/09/2012   Epiretinal membrane 03/09/2012   Hyperopia with astigmatism and presbyopia 03/09/2012   Pseudophakia 03/09/2012   HTN (hypertension) 08/31/2011   Right knee pain 08/31/2011   Benign hypertensive heart disease without heart failure 05/12/2011    PCP: BMosie Lukes MD  REFERRING PROVIDER: SEldridge Abrahams MD   REFERRING DIAG: G20.A1 (ICD-10-CM) - Parkinson's disease without dyskinesia, without mention of fluctuations  THERAPY DIAG:  Other abnormalities of gait and mobility  Unsteadiness on feet  Muscle weakness (generalized)  Cervicalgia  RATIONALE FOR EVALUATION AND TREATMENT: Rehabilitation  ONSET DATE: Diagnosed with PD in ~2012  NEXT MD VISIT: 6 months with PA   SUBJECTIVE:      SUBJECTIVE STATEMENT: Pt  reports she got up this morning pain free but the pain has since come back.  PAIN:  Are you having pain? Yes: NPRS scale: 0/10 at rest but with movement up 4-5/10 Pain location: L side of neck and along spine Pain description: constant dull ache; sharp, shooting when more intense extending into L upper shoulder Aggravating factors: turning head to R, sleeping position Relieving factors: nothing  PERTINENT HISTORY:  R shoulder advanced OA - scheduled for reverse TSA on 10/14/22; R TKA 07/2019, B THR 2018 & 2017, posterior C1-2 cervical fusion 2018, rupture of flexor tendon of L hand with failed surgical repair 2020, Parkinson's, bladder cancer, HTN   PRECAUTIONS: None  WEIGHT BEARING RESTRICTIONS No  FALLS: Has patient fallen in last 6 months? No  LIVING ENVIRONMENT: Lives with: lives alone Lives in: House/apartment Stairs: Yes: External:  4 steps; on right going up, on left going up, and can reach both to garage Has following equipment at home: Single point cane, Walker - 2 wheeled, shower chair, and Grab bars  OCCUPATION: Retired  PLOF: Independent and Leisure: daily stretching/exercises ~20 min/day; walk 1/4 mile daily; active in church - bible study    PATIENT GOALS: "To be be able to walk safely after my shoulder surgery."   OBJECTIVE:   DIAGNOSTIC FINDINGS:  N/A  COGNITION: Overall cognitive status: Within functional limits for tasks assessed   SENSATION: WFL  COORDINATION: WFL other than limitations in motion due to joint stiffness  PALPATION: Increased muscle tension and TTP in L>R SCM, cervical paraspinals, scalenes, UT and LS   CERVICAL ROM:    Active ROM AROM  09/02/22  Flexion 34  Extension 35  Right lateral flexion 21  Left lateral flexion 20  Right rotation 30  Left rotation 29   (Blank rows = not tested)  UPPER EXTREMITY ROM:   Active ROM Right 09/02/22 Left 09/02/22  Shoulder flexion 168 53  Shoulder extension    Shoulder abduction 179  51  Shoulder adduction    Shoulder internal rotation    Shoulder external rotation 69 39  Elbow flexion    Elbow extension    Wrist flexion    Wrist extension    Wrist ulnar deviation    Wrist radial deviation    Wrist pronation    Wrist supination     (Blank rows = not tested)  UPPER EXTREMITY MMT:   MMT Right 09/02/22 Left NT d/t ROM restrictions  Shoulder flexion 4+   Shoulder extension 4+   Shoulder abduction 4+   Shoulder adduction    Shoulder internal rotation 5   Shoulder external rotation 4+   Middle trapezius    Lower trapezius    Elbow flexion    Elbow extension    Wrist flexion    Wrist extension    Wrist ulnar deviation    Wrist radial deviation    Wrist pronation    Wrist supination    Grip strength     (Blank rows = not tested)  CERVICAL SPECIAL TESTS:  Spurling's test: Negative and Distraction test: Negative  MUSCLE LENGTH: Hamstrings: mod tight B ITB: mild/mod tight R>L Piriformis: mod tight B Hip flexors: mild/mod tight B Quads: mild/mod tight B Heelcord: WFL  POSTURE:  rounded shoulders, forward head, decreased thoracic kyphosis, weight shift right, and scoliosis with R lateral shift of head and shoulders  LOWER EXTREMITY ROM:    Limited B hip ER but otherwise WFL  LOWER EXTREMITY MMT:    MMT Right Eval Left Eval  Hip flexion 4 4  Hip extension 4 4  Hip abduction 4 4  Hip adduction 4 4  Hip internal rotation 4+ 4+  Hip external rotation 4+ 4+  Knee flexion 5 5  Knee extension 5 5  Ankle dorsiflexion 4+ 4+  Ankle plantarflexion 5 5  Ankle inversion    Ankle eversion    (Blank rows = not tested)  BED MOBILITY:  Sit to supine Complete Independence Supine to sit Complete Independence Rolling to Right Complete Independence Rolling to Left Complete Independence  TRANSFERS: Assistive device utilized: None  Sit to stand: Complete Independence Stand to sit: Complete Independence Chair to chair: Complete Independence Floor:   NT  GAIT: Gait pattern: decreased arm swing- Right, decreased arm swing- Left, lateral lean- Right, and decreased trunk rotation Distance walked: 120 Assistive device utilized:  None Level of assistance: Complete Independence Comments: Gait speed = 3.17 ft/sec  RAMP: Level of Assistance:  NT Assistive device utilized: None Ramp Comments:   CURB:  Level of Assistance:  NT Assistive device utilized: None Curb Comments:   STAIRS:  Level of Assistance: SBA  Stair Negotiation Technique: Alternating Pattern  with Single Rail on Right  Number of Stairs: 14   Height of Stairs: 7   FUNCTIONAL TESTS:  5 times sit to stand: 16.23 sec; >16 sec indicates fall risk with PD, >15 sec indicates risk for recurrent falls with geriatric population Timed up and go (TUG): 10.78 sec (Normal), 10.59 sec (Manual), 12.25 sec (Cognitive) 10 meter walk test: 10.34 sec (pt noting DOE), Gait speed: 3.17 ft/sec  Berg Balance Scale: 51/56; 46-51 moderate fall risk (>50%)  Functional gait assessment: 19/30; 19-24 = medium risk fall  Baselines as of D/C from last PT episode (02/2022): 5 times sit to stand: 12.53 sec Timed up and go (TUG): 8.88 sec (Normal) 10 meter walk test: 8.82 sec; Gait speed: 3.72 ft/sec  Berg Balance Scale: 52/56 Functional gait assessment: 26/30  PATIENT SURVEYS:  ABC scale 1470 / 1600 = 91.9 %   TODAY'S TREATMENT:  09/08/22 THERAPEUTIC EXERCISE: to improve flexibility, strength and mobility.  Verbal and tactile cues throughout for technique. NuStep - L4 x 6 min (B LE & R UE) Seated cervical retraction/chin tuck + scap retraction and depression 10 x 5" Seated scap retraction + B shoulder ER 10 x 5" Seated RTB row 10 x 3" - cues to avoid excessive shoulder extension and upward rotation of elbows Seated RTB retraction + shoulder extension to neutral 10 x 3"  MANUAL THERAPY: To promote normalized muscle tension, improved flexibility, improved joint mobility, increased ROM, and  reduced pain. STM and manual TPR to L subscapularis, teres group and rhomboids STM and pin & stretch to L SCM, scalenes and cervical paraspinals   09/02/22 THERAPEUTIC EXERCISE: to improve flexibility, strength and mobility.  Verbal and tactile cues throughout for technique. NuStep - L4 x 6 min (B LE & R UE) L SCM stretch 2 x 30" - focusing on just cervical extension component L UT/scalenes stretch with towel over shoulder 2 x 30 - focusing on just lateral flexion/side-bending component Cervical retraction/chin tuck 10 x 5" Cervical retraction/chin tuck + R/L lateral flexion/side-bending 10 x 3" to each side Cervical retraction/chin tuck + R/L cervical rotation 10 x 3" - more difficulty/discomfort with R rotation  MANUAL THERAPY: To promote normalized muscle tension, improved flexibility, improved joint mobility, increased ROM, and reduced pain. Gentle STM/DTM, manual TPR and pin & stretch to L UT and scalenes - very TTP  THERAPEUTIC ACTIVITIES: Cervical and UE ROM and MMT assessment - refer to above tables   08/25/22 THERAPEUTIC EXERCISE: to improve flexibility, strength and mobility.  Verbal and tactile cues throughout for technique. Cervical retraction/chin tuck 10 x 5" L SCM stretch x 30" L UT/scalenes stretch x 30"   PATIENT EDUCATION: Education details: PT eval findings, anticipated POC, and initial HEP Person educated: Patient Education method: Explanation, Demonstration, Tactile cues, Verbal cues, and Handouts Education comprehension: verbalized understanding, returned demonstration, verbal cues required, tactile cues required, and needs further education   HOME EXERCISE PROGRAM: Access Code: [email or web address] URL: [email or web address] Date: 08/25/2022 Prepared by: Annie Paras  Exercises - Seated Cervical Retraction  - 2-3 x daily - 7 x weekly - 2 sets - 10 reps - 3-5 sec hold - Sternocleidomastoid Stretch (Mirrored)  - 2-3 x  daily - 7 x weekly - 3 reps - 30  sec hold - Seated Scalene Stretch with Towel (Mirrored)  - 2-3 x daily - 7 x weekly - 3 reps - 30 sec hold - Seated Hamstring Stretch  - 2 x daily - 7 x weekly - 3 reps - 30 sec hold   ASSESSMENT:  CLINICAL IMPRESSION: Shaylin reports increased pain the morning following last visit which made it difficult for her to perform her HEP that day but woke this morning with no pain, although pain returning as she got moving. She continues to feel that he neck pain limits her from doing the things she wants to and feels that it is more incapacitating than the Parkinson's related deficits, hence today's treatment continuing to focus on her neck. She is very TTP throughout her L-sided neck and periscapular muscles with limited tolerance for manual STM and MFR but did note some reduction in pain and movement tolerance during exercise following MT attempts. Will plan to continue focus on addressing neck/upper shoulder pain and progress to Parkinson's related movement and strengthening as pain better controlled.  OBJECTIVE IMPAIRMENTS: Abnormal gait, cardiopulmonary status limiting activity, decreased activity tolerance, decreased balance, decreased coordination, decreased endurance, decreased mobility, difficulty walking, decreased ROM, decreased strength, hypomobility, increased fascial restrictions, impaired perceived functional ability, increased muscle spasms, impaired flexibility, improper body mechanics, postural dysfunction, and pain.   ACTIVITY LIMITATIONS: carrying, lifting, sleeping, bed mobility, reach over head, and locomotion level  PARTICIPATION LIMITATIONS: meal prep, cleaning, laundry, shopping, and community activity  PERSONAL FACTORS: Age, Past/current experiences, Time since onset of injury/illness/exacerbation, and 3+ comorbidities: R shoulder advanced OA - scheduled for reverse TSA on 10/14/22; R TKA 07/2019, B THR 2018 & 2017, posterior C1-2 cervical fusion 2018, rupture of flexor tendon of L  hand with failed surgical repair 2020, Parkinson's, bladder cancer, HTN   are also affecting patient's functional outcome.   REHAB POTENTIAL: Good  CLINICAL DECISION MAKING: Evolving/moderate complexity  EVALUATION COMPLEXITY: Moderate   GOALS: Goals reviewed with patient? Yes  SHORT TERM GOALS: Target date: 09/15/2022   Patient will be independent with initial HEP. Baseline:  Goal status: IN PROGRESS  2.  Patient will be educated on strategies to decrease risk of falls.  Baseline:  Goal status: IN PROGRESS  LONG TERM GOALS: Target date: 10/06/2022   Patient will be independent with ongoing/advanced HEP for self-management at home incorporating PWR! Moves as indicated .  Baseline:  Goal status: IN PROGRESS  2.  Patient will be able to ambulate 600' with LRAD with good safety to access community.  Baseline:  Goal status: IN PROGRESS  3.  Patient will be able to step up/down curb safely with LRAD for safety with community ambulation.  Baseline:  Goal status: IN PROGRESS   4.  Patient to improve cervical AROM by >/= 25% with pain dereased by >/= 50% with movement to allow for increased participation in church services and improve safety with driving   Baseline:  Goal status: IN PROGRESS  5.  Patient will improve 5x STS time to </= 14.8 seconds to demonstrate improved functional strength and transfer efficiency per age appropriate norms. Baseline: 16.23 sec Goal status: IN PROGRESS  6.  Patient will demonstrate at least 24/30 on FGA to improve gait stability and reduce risk for falls. (MCID = 4 points) Baseline: 19/30 Goal status: IN PROGRESS  7.  Patient will improve Berg score to >/= 54/56 to improve safety and stability with ADLs in standing and  reduce risk for falls.  Baseline: 51/56 Goal status: IN PROGRESS  9.  Patient will maintain or improve score on ABC scale to demonstrate improved balance confidence and decreased risk for falls. Baseline: 1470 / 1600 =  91.9% Goal status: IN PROGRESS  10. Patient will verbalize understanding of local Parkinson's disease community resources, including community fitness post d/c. Baseline:  Goal status: IN PROGRESS   PLAN: PT FREQUENCY: 2x/week  PT DURATION: 6 weeks  PLANNED INTERVENTIONS: Therapeutic exercises, Therapeutic activity, Neuromuscular re-education, Balance training, Gait training, Patient/Family education, Self Care, Joint mobilization, Dry Needling, Electrical stimulation, Spinal mobilization, Cryotherapy, Moist heat, Taping, Ultrasound, Ionotophoresis '4mg'$ /ml Dexamethasone, Manual therapy, and Re-evaluation  PLAN FOR NEXT SESSION:  gentle cervical ROM and stretching; MT +/- DN to address neck pain and abnormal muscle tension; review/update HEP PRN; progress to LE strengthening and PD related movements as neck pain better controlled   Percival Spanish, PT 09/08/2022, 12:31 PM

## 2022-09-13 ENCOUNTER — Encounter: Payer: Self-pay | Admitting: Physical Therapy

## 2022-09-13 ENCOUNTER — Ambulatory Visit: Payer: Medicare Other | Admitting: Physical Therapy

## 2022-09-13 DIAGNOSIS — R2681 Unsteadiness on feet: Secondary | ICD-10-CM

## 2022-09-13 DIAGNOSIS — M6281 Muscle weakness (generalized): Secondary | ICD-10-CM

## 2022-09-13 DIAGNOSIS — M542 Cervicalgia: Secondary | ICD-10-CM | POA: Diagnosis not present

## 2022-09-13 DIAGNOSIS — G20A1 Parkinson's disease without dyskinesia, without mention of fluctuations: Secondary | ICD-10-CM | POA: Diagnosis not present

## 2022-09-13 DIAGNOSIS — R2689 Other abnormalities of gait and mobility: Secondary | ICD-10-CM

## 2022-09-13 NOTE — Therapy (Signed)
OUTPATIENT PHYSICAL THERAPY TREATMENT   Patient Name: Stephanie [name] MRN: [id number] DOB:10-29-1932, 88 y.o., female Today's Date: 09/13/2022    PT End of Session - 09/13/22 1402     Visit Number 4    Date for PT Re-Evaluation 10/06/22    Authorization Type Medicare & BCBS    PT Start Time 1402    PT Stop Time 1447    PT Time Calculation (min) 45 min    Activity Tolerance Patient tolerated treatment well    Behavior During Therapy [facility] for tasks assessed/performed               Past Medical History:  Diagnosis Date   Allergy    Anemia    past hx    Arthropathy of cervical spine 11/27/2012   Right  Xray at [facility]  On 04/08/2016  AP, lateral, and lateral flexion and extension views of the cervical spine are submitted for evaluation.   No acute fracture identified in the cervical spine.   There is redemonstration of approximately 2 mm of C3 on C4 anterolisthesis in neutral which slightly increases in flexion relative to neutral and extension. Slight C5 on C6 retrolisthesis does not change i   Benign hypertension without congestive heart failure    Benign mole    right hcets mole, bleeds when dried with towel   Bilateral dry eyes    Bladder cancer ([place]) 10-30-2016   Cervical spondylosis    Cervicalgia    2 screws in place    Chronic kidney disease    kidney stones    Depression with anxiety Oct 30, 2016   prior to husbands death    Gross hematuria    developed after first hip replacment, led to indicental finding of bladder tumor, bleeding now resolved    History of kidney stones    1970's   Left hand weakness    due to reinjury of flxor tendon s/p tendon surgery march 12-2018   Lumbar stenosis    Malignant tumor of urinary bladder ([place]) 07/2016   Pre-stage I   OA (osteoarthritis)    Parkinson disease dx 2012   neurologist-  dr siddiqui at [place] [facility])     Dr Trudie Reed , [place] [facility]    Rupture of flexor tendon of hand 2020   right   Past  Surgical History:  Procedure Laterality Date   BIOPSY MASS LEFT FIRST METACARPAL   06/23/2006   benign   CATARACT EXTRACTION W/ INTRAOCULAR LENS  IMPLANT, BILATERAL  1999 and 2003   CERVICAL FUSION  02/2017   c1-c2 ; reports she has 2 screws 2in long in place done at [facility] ; patient exhibits VERY LIMITED NECK ROM   COLONOSCOPY     CYSTOSCOPY W/ RETROGRADES Bilateral 09/22/2016   Procedure: CYSTOSCOPY WITH RETROGRADE PYELOGRAM;  Surgeon: Alexis Frock, MD;  Location: [facility];  Service: Urology;  Laterality: Bilateral;   D & C HYSTEROSCOPY W/ RESECTION POLYP  07/11/2000   DILATION AND CURETTAGE OF UTERUS  1960's   EXCISION CYST AND DEBRIDEMENT RIGHT WIRST AND REMOVAL FORGEIGN BODY  01/09/2009   FLEXOR TENDON REPAIR Left 12/12/2018   Procedure: REPAIR/TRANSFER FLEXOR DIGITORUM PROFUNDUS OF LEFT SMALL FINGER;  Surgeon: Daryll Brod, MD;  Location: [place];  Service: Orthopedics;  Laterality: Left;   LAPAROSCOPY  yrs ago   infertility    METACARPOPHALANGEAL JOINT ARTHRODESIS Right 05/25/1996   TENDON REPAIR Left 01/15/2019   Hand  TONSILLECTOMY AND ADENOIDECTOMY  child   TOTAL HIP ARTHROPLASTY Left 07/07/2016   Procedure: LEFT TOTAL HIP ARTHROPLASTY ANTERIOR APPROACH;  Surgeon: Gaynelle Arabian, MD;  Location: [facility];  Service: Orthopedics;  Laterality: Left;   TOTAL HIP ARTHROPLASTY Right 09/28/2017   Procedure: RIGHT TOTAL HIP ARTHROPLASTY ANTERIOR APPROACH;  Surgeon: Gaynelle Arabian, MD;  Location: [facility];  Service: Orthopedics;  Laterality: Right;   TOTAL KNEE ARTHROPLASTY Right 07/30/2019   Procedure: TOTAL KNEE ARTHROPLASTY;  Surgeon: Gaynelle Arabian, MD;  Location: [facility];  Service: Orthopedics;  Laterality: Right;  37mn   TRANSURETHRAL RESECTION OF BLADDER TUMOR N/A 09/22/2016   Procedure: TRANSURETHRAL RESECTION OF BLADDER TUMOR (TURBT);  Surgeon: TAlexis Frock MD;  [facility]  Service: Urology;  Laterality: N/A;    Patient Active Problem List   Diagnosis Date Noted   Dyspnea 08/02/2022   Insomnia 08/02/2022   Hyponatremia 08/01/2022   Shoulder pain, left 09/14/2021   History of COVID-19 01/06/2021   Diarrhea 09/03/2020   Vitamin D deficiency 09/03/2020   OA (osteoarthritis) of knee 07/30/2019   Tinea corporis 06/08/2019   [place] 04/02/2019   Anemia 03/13/2018   Hot flashes 03/13/2018   S/P cervical spinal fusion 03/31/2017   Neck pain 12/12/2016   Depression with anxiety 10/17/2016   [place] 10/17[place]   OA (osteoarthritis) of hip 07/07/2016   Spinal stenosis of lumbar region with radiculopathy 04/08/2016   Spondylolisthesis of lumbar region 04/08/2016   Spondylosis of cervical region without myelopathy or radiculopathy 04/08/2016   Preventative health care 01/18/2016   Low back pain 01/18/2016   History of chicken pox    Hyperlipidemia 12/15/2014   Allergic rhinitis 06/25/2014   Allergy to bee sting 06/10/2014   TMJ tenderness 11/16/2013   Other malaise and fatigue 05/07/2013   Arth[place] of cervical spine [place] 11/27/2012   Muscle spasms of neck 11/27/2012   Parkinson's disease 05/24/2012   Conjunctivochalasis 03/09/2012   Epiretinal membrane 03/09/2012   Hyperopia with astigmatism and presbyopia 03/09/2012   Pseudophakia 03/09/2012   HTN (hypertension) 08/31/2011   Right knee pain 08/31/2011   Benign hypertensive heart disease without heart failure 05/12/2011    PCP: BMosie Lukes MD  REFERRING PROVIDER: SEldridge Abrahams MD   REFERRING DIAG: G20.A1 (ICD-10-CM) - Parkinson's disease without dyskinesia, without mention of fluctuations  THERAPY DIAG:  Other abnormalities of gait and mobility  Unsteadiness on feet  Muscle weakness (generalized)  Cervicalgia  RATIONALE FOR EVALUATION AND TREATMENT: Rehabilitation  ONSET DATE: Diagnosed with PD in ~2012  NEXT MD VISIT: 6 months with PA   SUBJECTIVE:      SUBJECTIVE  STATEMENT: Pt brought a picture of the molded pillow she uses when sleeping to see if this is acceptable.   PAIN:  Are you having pain? Yes: NPRS scale: 3-4/10 at rest but with movement up 4-5/10 Pain location: L side of neck and along spine Pain description: constant dull ache; sharp, shooting when more intense extending into L upper shoulder Aggravating factors: turning head to R, sleeping position Relieving factors: nothing  PERTINENT HISTORY:  R shoulder advanced OA - scheduled for reverse TSA on 10/14/22; R TKA 07/2019, B THR 2018 & 2017, posterior C1-2 cervical fusion 2018, rupture of flexor tendon of L hand with failed surgical repair 2020, Parkinson's, bladder cancer, HTN   PRECAUTIONS: None  WEIGHT BEARING RESTRICTIONS No  FALLS: Has patient fallen in last 6 months? No  LIVING ENVIRONMENT: Lives with: lives alone Lives in:  House/apartment Stairs: Yes: External: 4 steps; on right going up, on left going up, and can reach both to garage Has following equipment at home: Single point cane, Walker - 2 wheeled, shower chair, and Grab bars  OCCUPATION: Retired  PLOF: Independent and Leisure: daily stretching/exercises ~20 min/day; walk 1/4 mile daily; active in church - bible study    PATIENT GOALS: "To be be able to walk safely after my shoulder surgery."   OBJECTIVE:   DIAGNOSTIC FINDINGS:  N/A  COGNITION: Overall cognitive status: Within functional limits for tasks assessed   SENSATION: WFL  COORDINATION: WFL other than limitations in motion due to joint stiffness  PALPATION: Increased muscle tension and TTP in L>R SCM, cervical paraspinals, scalenes, UT and LS   CERVICAL ROM:    Active ROM AROM  09/02/22  Flexion 34  Extension 35  Right lateral flexion 21  Left lateral flexion 20  Right rotation 30  Left rotation 29   (Blank rows = not tested)  UPPER EXTREMITY ROM:   Active ROM Right 09/02/22 Left 09/02/22  Shoulder flexion 168 53  Shoulder  extension    Shoulder abduction 179 51  Shoulder adduction    Shoulder internal rotation    Shoulder external rotation 69 39  Elbow flexion    Elbow extension    Wrist flexion    Wrist extension    Wrist ulnar deviation    Wrist radial deviation    Wrist pronation    Wrist supination     (Blank rows = not tested)  UPPER EXTREMITY MMT:   MMT Right 09/02/22 Left NT d/t ROM restrictions  Shoulder flexion 4+   Shoulder extension 4+   Shoulder abduction 4+   Shoulder adduction    Shoulder internal rotation 5   Shoulder external rotation 4+   Middle trapezius    Lower trapezius    Elbow flexion    Elbow extension    Wrist flexion    Wrist extension    Wrist ulnar deviation    Wrist radial deviation    Wrist pronation    Wrist supination    Grip strength     (Blank rows = not tested)  CERVICAL SPECIAL TESTS:  Spurling's test: Negative and Distraction test: Negative  MUSCLE LENGTH: Hamstrings: mod tight B ITB: mild/mod tight R>L Piriformis: mod tight B Hip flexors: mild/mod tight B Quads: mild/mod tight B Heelcord: WFL  POSTURE:  rounded shoulders, forward head, decreased thoracic kyphosis, weight shift right, and scoliosis with R lateral shift of head and shoulders  LOWER EXTREMITY ROM:    Limited B hip ER but otherwise WFL  LOWER EXTREMITY MMT:    MMT Right Eval Left Eval  Hip flexion 4 4  Hip extension 4 4  Hip abduction 4 4  Hip adduction 4 4  Hip internal rotation 4+ 4+  Hip external rotation 4+ 4+  Knee flexion 5 5  Knee extension 5 5  Ankle dorsiflexion 4+ 4+  Ankle plantarflexion 5 5  Ankle inversion    Ankle eversion    (Blank rows = not tested)  BED MOBILITY:  Sit to supine Complete Independence Supine to sit Complete Independence Rolling to Right Complete Independence Rolling to Left Complete Independence  TRANSFERS: Assistive device utilized: None  Sit to stand: Complete Independence Stand to sit: Complete Independence Chair to  chair: Complete Independence Floor:  NT  GAIT: Gait pattern: decreased arm swing- Right, decreased arm swing- Left, lateral lean- Right, and decreased trunk rotation Distance walked:  120 Assistive device utilized: None Level of assistance: Complete Independence Comments: Gait speed = 3.17 ft/sec  RAMP: Level of Assistance:  NT Assistive device utilized: None Ramp Comments:   CURB:  Level of Assistance:  NT Assistive device utilized: None Curb Comments:   STAIRS:  Level of Assistance: SBA  Stair Negotiation Technique: Alternating Pattern  with Single Rail on Right  Number of Stairs: 14   Height of Stairs: 7   FUNCTIONAL TESTS:  5 times sit to stand: 16.23 sec; >16 sec indicates fall risk with PD, >15 sec indicates risk for recurrent falls with geriatric population Timed up and go (TUG): 10.78 sec (Normal), 10.59 sec (Manual), 12.25 sec (Cognitive) 10 meter walk test: 10.34 sec (pt noting DOE), Gait speed: 3.17 ft/sec  Berg Balance Scale: 51/56; 46-51 moderate fall risk (>50%)  Functional gait assessment: 19/30; 19-24 = medium risk fall  Baselines as of D/C from last PT episode (02/2022): 5 times sit to stand: 12.53 sec Timed up and go (TUG): 8.88 sec (Normal) 10 meter walk test: 8.82 sec; Gait speed: 3.72 ft/sec  Berg Balance Scale: 52/56 Functional gait assessment: 26/30  PATIENT SURVEYS:  ABC scale 1470 / 1600 = 91.9 %   TODAY'S TREATMENT:  09/13/22 THERAPEUTIC EXERCISE: to improve flexibility, strength and mobility.  Verbal and tactile cues throughout for technique. NuStep - L6 x 6 min (B LE & R UE) Seated scap retraction into pool noodle at back of chair + shoulder extension & long-arm ER 10 x 3", 2 sets Seated scap retraction into pool noodle at back of chair + shoulder bent-arm ER - deferred d/t increased L shoulder pain Seated thoracic extension over back of chair (Airex pad in seat and feet elevated on 6" step for better alignment with back of chair) 10 x 3" -  better tolerated with hands in lap/at sides than with arms folded over chest  MANUAL THERAPY: To promote normalized muscle tension, improved flexibility, improved joint mobility, increased ROM, and reduced pain. STM, manual TPR and pin & stretch to L UT, LS and scalenes   09/08/22 THERAPEUTIC EXERCISE: to improve flexibility, strength and mobility.  Verbal and tactile cues throughout for technique. NuStep - L4 x 6 min (B LE & R UE) Seated cervical retraction/chin tuck + scap retraction and depression 10 x 5" Seated scap retraction + B shoulder ER 10 x 5" Seated RTB row 10 x 3" - cues to avoid excessive shoulder extension and upward rotation of elbows Seated RTB retraction + shoulder extension to neutral 10 x 3"  MANUAL THERAPY: To promote normalized muscle tension, improved flexibility, improved joint mobility, increased ROM, and reduced pain. STM and manual TPR to L subscapularis, teres group and rhomboids STM and pin & stretch to L SCM, scalenes and cervical paraspinals   09/02/22 THERAPEUTIC EXERCISE: to improve flexibility, strength and mobility.  Verbal and tactile cues throughout for technique. NuStep - L4 x 6 min (B LE & R UE) L SCM stretch 2 x 30" - focusing on just cervical extension component L UT/scalenes stretch with towel over shoulder 2 x 30 - focusing on just lateral flexion/side-bending component Cervical retraction/chin tuck 10 x 5" Cervical retraction/chin tuck + R/L lateral flexion/side-bending 10 x 3" to each side Cervical retraction/chin tuck + R/L cervical rotation 10 x 3" - more difficulty/discomfort with R rotation  MANUAL THERAPY: To promote normalized muscle tension, improved flexibility, improved joint mobility, increased ROM, and reduced pain. Gentle STM/DTM, manual TPR and pin & stretch to L  UT and scalenes - very TTP  THERAPEUTIC ACTIVITIES: Cervical and UE ROM and MMT assessment - refer to above tables   PATIENT EDUCATION: Education details: HEP  update - Seated scap retraction into towel at back of chair + shoulder extension & long-arm ER, thoracic extension Person educated: Patient Education method: Explanation, Demonstration, Verbal cues, and Handouts Education comprehension: verbalized understanding, returned demonstration, verbal cues required, and needs further education   HOME EXERCISE PROGRAM: Access Code: [email or web address] URL: [email or web address] Date: 09/13/2022 Prepared by: Annie Paras  Exercises - Seated Cervical Retraction  - 2-3 x daily - 7 x weekly - 2 sets - 10 reps - 3-5 sec hold - Sternocleidomastoid Stretch (Mirrored)  - 2-3 x daily - 7 x weekly - 3 reps - 30 sec hold - Seated Scalene Stretch with Towel (Mirrored)  - 2-3 x daily - 7 x weekly - 3 reps - 30 sec hold - Seated Hamstring Stretch  - 2 x daily - 7 x weekly - 3 reps - 30 sec hold - Seated Thoracic Extension AROM  - 2 x daily - 7 x weekly - 2 sets - 10 reps - 3 sec hold  Patient Education - Check for Safety   ASSESSMENT:  CLINICAL IMPRESSION: Jyasia continues to note L-sided neck and shoulder pain. She reports relief from MT in prior visits but then pain returns, although when DN suggested, pt adamantly declining DN due to h/o bad experience with DN. MT addressing increased muscle tension, TPs and taut bands - she remains very TTP but able to tolerate manual TPR and MFR with some relief noted. Stretches and exercises reinforcing improved postural alignment and promoting normal muscle activation with good tolerance. Pt reporting no concerns with initial HEP - STG #1 met. Fall checklist reviewed with pt reporting all measures already taken when her husband was dealing with his Parkinson's diagnosis - STG #2 met.  OBJECTIVE IMPAIRMENTS: Abnormal gait, cardiopulmonary status limiting activity, decreased activity tolerance, decreased balance, decreased coordination, decreased endurance, decreased mobility, difficulty walking, decreased ROM, decreased  strength, hypomobility, increased fascial restrictions, impaired perceived functional ability, increased muscle spasms, impaired flexibility, improper body mechanics, postural dysfunction, and pain.   ACTIVITY LIMITATIONS: carrying, lifting, sleeping, bed mobility, reach over head, and locomotion level  PARTICIPATION LIMITATIONS: meal prep, cleaning, laundry, shopping, and community activity  PERSONAL FACTORS: Age, Past/current experiences, Time since onset of injury/illness/exacerbation, and 3+ comorbidities: R shoulder advanced OA - scheduled for reverse TSA on 10/14/22; R TKA 07/2019, B THR 2018 & 2017, posterior C1-2 cervical fusion 2018, rupture of flexor tendon of L hand with failed surgical repair 2020, Parkinson's, bladder cancer, HTN   are also affecting patient's functional outcome.   REHAB POTENTIAL: Good  CLINICAL DECISION MAKING: Evolving/moderate complexity  EVALUATION COMPLEXITY: Moderate   GOALS: Goals reviewed with patient? Yes  SHORT TERM GOALS: Target date: 09/15/2022   Patient will be independent with initial HEP. Baseline:  Goal status: MET  09/13/22  2.  Patient will be educated on strategies to decrease risk of falls.  Baseline:  Goal status: MET  09/13/22  LONG TERM GOALS: Target date: 10/06/2022   Patient will be independent with ongoing/advanced HEP for self-management at home incorporating PWR! Moves as indicated .  Baseline:  Goal status: IN PROGRESS  2.  Patient will be able to ambulate 600' with LRAD with good safety to access community.  Baseline:  Goal status: IN PROGRESS  3.  Patient will be able to step up/down curb safely with LRAD for  safety with community ambulation.  Baseline:  Goal status: IN PROGRESS   4.  Patient to improve cervical AROM by >/= 25% with pain dereased by >/= 50% with movement to allow for increased participation in church services and improve safety with driving   Baseline:  Goal status: IN PROGRESS  5.  Patient will  improve 5x STS time to </= 14.8 seconds to demonstrate improved functional strength and transfer efficiency per age appropriate norms. Baseline: 16.23 sec Goal status: IN PROGRESS  6.  Patient will demonstrate at least 24/30 on FGA to improve gait stability and reduce risk for falls. (MCID = 4 points) Baseline: 19/30 Goal status: IN PROGRESS  7.  Patient will improve Berg score to >/= 54/56 to improve safety and stability with ADLs in standing and reduce risk for falls.  Baseline: 51/56 Goal status: IN PROGRESS  9.  Patient will maintain or improve score on ABC scale to demonstrate improved balance confidence and decreased risk for falls. Baseline: 1470 / 1600 = 91.9% Goal status: IN PROGRESS  10. Patient will verbalize understanding of local Parkinson's disease community resources, including community fitness post d/c. Baseline:  Goal status: IN PROGRESS   PLAN: PT FREQUENCY: 2x/week  PT DURATION: 6 weeks  PLANNED INTERVENTIONS: Therapeutic exercises, Therapeutic activity, Neuromuscular re-education, Balance training, Gait training, Patient/Family education, Self Care, Joint mobilization, Dry Needling, Electrical stimulation, Spinal mobilization, Cryotherapy, Moist heat, Taping, Ultrasound, Ionotophoresis 73m/ml Dexamethasone, Manual therapy, and Re-evaluation  PLAN FOR NEXT SESSION:  gentle cervical ROM and stretching; MT +/- DN to address neck pain and abnormal muscle tension; review/update HEP PRN; progress to LE strengthening and PD related movements as neck pain better controlled   JPercival Spanish PT 09/13/2022, 3:59 PM

## 2022-09-16 ENCOUNTER — Other Ambulatory Visit: Payer: Self-pay | Admitting: Family Medicine

## 2022-09-16 NOTE — Telephone Encounter (Signed)
Pt has not been seen since sept 2023. Please advise if these rx are appropriate to refill

## 2022-09-17 ENCOUNTER — Encounter: Payer: Self-pay | Admitting: Physical Therapy

## 2022-09-17 ENCOUNTER — Ambulatory Visit: Payer: Medicare Other | Attending: Neurology | Admitting: Physical Therapy

## 2022-09-17 DIAGNOSIS — R293 Abnormal posture: Secondary | ICD-10-CM | POA: Insufficient documentation

## 2022-09-17 DIAGNOSIS — M6281 Muscle weakness (generalized): Secondary | ICD-10-CM | POA: Insufficient documentation

## 2022-09-17 DIAGNOSIS — R2689 Other abnormalities of gait and mobility: Secondary | ICD-10-CM | POA: Diagnosis not present

## 2022-09-17 DIAGNOSIS — M25612 Stiffness of left shoulder, not elsewhere classified: Secondary | ICD-10-CM | POA: Insufficient documentation

## 2022-09-17 DIAGNOSIS — M25512 Pain in left shoulder: Secondary | ICD-10-CM | POA: Diagnosis not present

## 2022-09-17 DIAGNOSIS — G8929 Other chronic pain: Secondary | ICD-10-CM | POA: Diagnosis not present

## 2022-09-17 DIAGNOSIS — M542 Cervicalgia: Secondary | ICD-10-CM | POA: Insufficient documentation

## 2022-09-17 DIAGNOSIS — M546 Pain in thoracic spine: Secondary | ICD-10-CM | POA: Insufficient documentation

## 2022-09-17 DIAGNOSIS — R2681 Unsteadiness on feet: Secondary | ICD-10-CM | POA: Diagnosis not present

## 2022-09-17 DIAGNOSIS — G20A1 Parkinson's disease without dyskinesia, without mention of fluctuations: Secondary | ICD-10-CM | POA: Insufficient documentation

## 2022-09-17 NOTE — Therapy (Signed)
OUTPATIENT PHYSICAL THERAPY TREATMENT   Patient Name: Stephanie [name] MRN: [id number] DOB:1932-06-01, 88 y.o., female Today's Date: 09/17/2022    PT End of Session - 09/17/22 1044     Visit Number 5    Date for PT Re-Evaluation 10/06/22    Authorization Type Medicare & BCBS    PT Start Time 1044    PT Stop Time 1128    PT Time Calculation (min) 44 min    Activity Tolerance Patient tolerated treatment well    Behavior During Therapy WFL for tasks assessed/performed               Past Medical History:  Diagnosis Date   Allergy    Anemia    past hx    Arthropathy of cervical spine 11/27/2012   Right  Xray at [facility]  On 04/08/2016  AP, lateral, and lateral flexion and extension views of the cervical spine are submitted for evaluation.   No acute fracture identified in the cervical spine.   There is redemonstration of approximately 2 mm of C3 on C4 anterolisthesis in neutral which slightly increases in flexion relative to neutral and extension. Slight C5 on C6 retrolisthesis does not change i   Benign hypertension without congestive heart failure    Benign mole    right hcets mole, bleeds when dried with towel   Bilateral dry eyes    Bladder cancer ([place]) 10/25/16   Cervical spondylosis    Cervicalgia    2 screws in place    Chronic kidney disease    kidney stones    Depression with anxiety 10-25-16   prior to husbands death    Gross hematuria    developed after first hip replacment, led to indicental finding of bladder tumor, bleeding now resolved    History of kidney stones    1970's   Left hand weakness    due to reinjury of flxor tendon s/p tendon surgery march 12-2018   Lumbar stenosis    Malignant tumor of urinary bladder ([place]) 07/2016   Pre-stage I   OA (osteoarthritis)    Parkinson disease dx 2012   neurologist-  dr siddiqui at [facility])     Dr Trudie Reed , [place] [facility]    Rupture of flexor tendon of hand 2020   right   Past  Surgical History:  Procedure Laterality Date   BIOPSY MASS LEFT FIRST METACARPAL   06/23/2006   benign   CATARACT EXTRACTION W/ INTRAOCULAR LENS  IMPLANT, BILATERAL  1999 and 2003   CERVICAL FUSION  02/2017   c1-c2 ; reports she has 2 screws 2in long in place done at [facility] ; patient exhibits VERY LIMITED NECK ROM   COLONOSCOPY     CYSTOSCOPY W/ RETROGRADES Bilateral 09/22/2016   Procedure: CYSTOSCOPY WITH RETROGRADE PYELOGRAM;  Surgeon: Alexis Frock, MD;  Location: [facility];  Service: Urology;  Laterality: Bilateral;   D & C HYSTEROSCOPY W/ RESECTION POLYP  07/11/2000   DILATION AND CURETTAGE OF UTERUS  1960's   EXCISION CYST AND DEBRIDEMENT RIGHT WIRST AND REMOVAL FORGEIGN BODY  01/09/2009   FLEXOR TENDON REPAIR Left 12/12/2018   Procedure: REPAIR/TRANSFER FLEXOR DIGITORUM PROFUNDUS OF LEFT SMALL FINGER;  Surgeon: Daryll Brod, MD;  Location: [facility];  Service: Orthopedics;  Laterality: Left;   LAPAROSCOPY  yrs ago   infertility    METACARPOPHALANGEAL JOINT ARTHRODESIS Right 05/25/1996   TENDON REPAIR Left 01/15/2019   Hand  TONSILLECTOMY AND ADENOIDECTOMY  child   TOTAL HIP ARTHROPLASTY Left 07/07/2016   Procedure: LEFT TOTAL HIP ARTHROPLASTY ANTERIOR APPROACH;  Surgeon: Gaynelle Arabian, MD;  Location: [facility];  Service: Orthopedics;  Laterality: Left;   TOTAL HIP ARTHROPLASTY Right 09/28/2017   Procedure: RIGHT TOTAL HIP ARTHROPLASTY ANTERIOR APPROACH;  Surgeon: Gaynelle Arabian, MD;  Location: [facility];  Service: Orthopedics;  Laterality: Right;   TOTAL KNEE ARTHROPLASTY Right 07/30/2019   Procedure: TOTAL KNEE ARTHROPLASTY;  Surgeon: Gaynelle Arabian, MD;  Location: [facility];  Service: Orthopedics;  Laterality: Right;  64mn   TRANSURETHRAL RESECTION OF BLADDER TUMOR N/A 09/22/2016   Procedure: TRANSURETHRAL RESECTION OF BLADDER TUMOR (TURBT);  Surgeon: TAlexis Frock MD;  [facility]  Service: Urology;  Laterality: N/A;    Patient Active Problem List   Diagnosis Date Noted   Dyspnea 08/02/2022   Insomnia 08/02/2022   Hyponatremia 08/01/2022   Shoulder pain, left 09/14/2021   History of COVID-19 01/06/2021   Diarrhea 09/03/2020   Vitamin D deficiency 09/03/2020   OA (osteoarthritis) of knee 07/30/2019   Tinea corporis 06/08/2019   Psoriatic arthritis ([place] 04/02/2019   Anemia 03/13/2018   Hot flashes 03/13/2018   S/P cervical spinal fusion 03/31/2017   Neck pain 12/12/2016   Depression with anxiety 10/17/2016   Bladder cancer ([place] 10/17/2016   OA (osteoarthritis) of hip 07/07/2016   Spinal stenosis of lumbar region with radiculopathy 04/08/2016   Spondylolisthesis of lumbar region 04/08/2016   Spondylosis of cervical region without myelopathy or radiculopathy 04/08/2016   Preventative health care 01/18/2016   Low back pain 01/18/2016   History of chicken pox    Hyperlipidemia 12/15/2014   Allergic rhinitis 06/25/2014   Allergy to bee sting 06/10/2014   TMJ tenderness 11/16/2013   Other malaise and fatigue 05/07/2013   Arthropathy of cervical spine ([place] 11/27/2012   Muscle spasms of neck 11/27/2012   Parkinson's disease 05/24/2012   Conjunctivochalasis 03/09/2012   Epiretinal membrane 03/09/2012   Hyperopia with astigmatism and presbyopia 03/09/2012   Pseudophakia 03/09/2012   HTN (hypertension) 08/31/2011   Right knee pain 08/31/2011   Benign hypertensive heart disease without heart failure 05/12/2011    PCP: BMosie Lukes MD  REFERRING PROVIDER: SEldridge Abrahams MD   REFERRING DIAG: G20.A1 (ICD-10-CM) - Parkinson's disease without dyskinesia, without mention of fluctuations  THERAPY DIAG:  Other abnormalities of gait and mobility  Unsteadiness on feet  Muscle weakness (generalized)  Cervicalgia  RATIONALE FOR EVALUATION AND TREATMENT: Rehabilitation  ONSET DATE: Diagnosed with PD in ~2012  NEXT MD VISIT: 6 months with PA   SUBJECTIVE:      SUBJECTIVE  STATEMENT: Pt reports increased soreness up to 10/10 the day following her last session up until ~8 pm that evening - pain now settled back down.  PAIN:  Are you having pain? Yes: NPRS scale: 3/10 Pain location: L side of neck and along spine Pain description: constant dull ache; sharp, shooting when more intense extending into L upper shoulder Aggravating factors: turning head to R, sleeping position Relieving factors: nothing  PERTINENT HISTORY:  L shoulder advanced OA - scheduled for reverse TSA on 10/14/22; R TKA 07/2019, B THR 2018 & 2017, posterior C1-2 cervical fusion 2018, rupture of flexor tendon of L hand with failed surgical repair 2020, Parkinson's, bladder cancer, HTN   PRECAUTIONS: None  WEIGHT BEARING RESTRICTIONS No  FALLS: Has patient fallen in last 6 months? No  LIVING ENVIRONMENT: Lives with: lives alone Lives in: House/apartment  Stairs: Yes: External: 4 steps; on right going up, on left going up, and can reach both to garage Has following equipment at home: Single point cane, Walker - 2 wheeled, shower chair, and Grab bars  OCCUPATION: Retired  PLOF: Independent and Leisure: daily stretching/exercises ~20 min/day; walk 1/4 mile daily; active in church - bible study    PATIENT GOALS: "To be be able to walk safely after my shoulder surgery."   OBJECTIVE:   DIAGNOSTIC FINDINGS:  N/A  COGNITION: Overall cognitive status: Within functional limits for tasks assessed   SENSATION: WFL  COORDINATION: WFL other than limitations in motion due to joint stiffness  PALPATION: Increased muscle tension and TTP in L>R SCM, cervical paraspinals, scalenes, UT and LS   CERVICAL ROM:    Active ROM AROM  09/02/22  Flexion 34  Extension 35  Right lateral flexion 21  Left lateral flexion 20  Right rotation 30  Left rotation 29   (Blank rows = not tested)  UPPER EXTREMITY ROM:   Active ROM Right 09/02/22 Left 09/02/22  Shoulder flexion 168 53  Shoulder  extension    Shoulder abduction 179 51  Shoulder adduction    Shoulder internal rotation    Shoulder external rotation 69 39  Elbow flexion    Elbow extension    Wrist flexion    Wrist extension    Wrist ulnar deviation    Wrist radial deviation    Wrist pronation    Wrist supination     (Blank rows = not tested)  UPPER EXTREMITY MMT:   MMT Right 09/02/22 Left NT d/t ROM restrictions  Shoulder flexion 4+   Shoulder extension 4+   Shoulder abduction 4+   Shoulder adduction    Shoulder internal rotation 5   Shoulder external rotation 4+   Middle trapezius    Lower trapezius    Elbow flexion    Elbow extension    Wrist flexion    Wrist extension    Wrist ulnar deviation    Wrist radial deviation    Wrist pronation    Wrist supination    Grip strength     (Blank rows = not tested)  CERVICAL SPECIAL TESTS:  Spurling's test: Negative and Distraction test: Negative  MUSCLE LENGTH: Hamstrings: mod tight B ITB: mild/mod tight R>L Piriformis: mod tight B Hip flexors: mild/mod tight B Quads: mild/mod tight B Heelcord: WFL  POSTURE:  rounded shoulders, forward head, decreased thoracic kyphosis, weight shift right, and scoliosis with R lateral shift of head and shoulders  LOWER EXTREMITY ROM:    Limited B hip ER but otherwise WFL  LOWER EXTREMITY MMT:    MMT Right Eval Left Eval  Hip flexion 4 4  Hip extension 4 4  Hip abduction 4 4  Hip adduction 4 4  Hip internal rotation 4+ 4+  Hip external rotation 4+ 4+  Knee flexion 5 5  Knee extension 5 5  Ankle dorsiflexion 4+ 4+  Ankle plantarflexion 5 5  Ankle inversion    Ankle eversion    (Blank rows = not tested)  BED MOBILITY:  Sit to supine Complete [place] Supine to sit [facility] [place] Rolling to Right [facility] [place] Rolling to Left [facility] [place]  TRANSFERS: Assistive device utilized: None  Sit to stand: Complete [place] Stand to sit: Complete [place] Chair to  chair: Complete [place] Floor:  NT  GAIT: Gait pattern: decreased arm swing- Right, decreased arm swing- Left, lateral lean- Right, and decreased trunk rotation Distance walked: 120  Assistive device utilized: None Level of assistance: Complete [place] Comments: Gait speed = 3.17 ft/sec  RAMP: Level of Assistance:  NT Assistive device utilized: None Ramp Comments:   CURB:  Level of Assistance:  NT Assistive device utilized: None Curb Comments:   STAIRS:  Level of Assistance: SBA  Stair Negotiation Technique: Alternating Pattern  with Single Rail on Right  Number of Stairs: 14   Height of Stairs: 7   FUNCTIONAL TESTS:  5 times sit to stand: 16.23 sec; >16 sec indicates fall risk with PD, >15 sec indicates risk for recurrent falls with geriatric population Timed up and go (TUG): 10.78 sec (Normal), 10.59 sec (Manual), 12.25 sec (Cognitive) 10 meter walk test: 10.34 sec (pt noting DOE), Gait speed: 3.17 ft/sec  Berg Balance Scale: 51/56; 46-51 moderate fall risk (>50%)  Functional gait assessment: 19/30; 19-24 = medium risk fall  Baselines as of D/C from last PT episode (02/2022): 5 times sit to stand: 12.53 sec Timed up and go (TUG): 8.88 sec (Normal) 10 meter walk test: 8.82 sec; Gait speed: 3.72 ft/sec  Berg Balance Scale: 52/56 Functional gait assessment: 26/30  PATIENT SURVEYS:  ABC scale 1470 / 1600 = 91.9 %   TODAY'S TREATMENT:  09/16/22 THERAPEUTIC EXERCISE: to improve flexibility, strength and mobility.  Verbal and tactile cues throughout for technique. NuStep - L6 x 6 min (B LE & R UE)  NEUROMUSCULAR RE-EDUCATION: To improve posture, balance, coordination, amplitude of movement, speed of movement to reduce bradykinesia, and reduce rigidity. Seated PWR! Moves (Up, Reach, Twist & Step) x 10 each - PWR! Reach modified to just L LE extension avoiding :L UE reach to avoid shoulder irritation, some increased neck pain noted during PWR! Step Standing PWR  Moves (Up, Reach, Twist & Step) x 10 each - PWR! Reach modified to only raise R UE reach to avoid L shoulder irritation PWR! Walk with exaggerated reciprocal arm swing 2 x 80 ft - pt struggling with coordination of reciprocal arm motion with stepping causing her to have to slow her pace with slight pause after each step; also limited by SOB - SpO2 98-100%, but HR increasing to 130's after only 1 rep   09/13/22 THERAPEUTIC EXERCISE: to improve flexibility, strength and mobility.  Verbal and tactile cues throughout for technique. NuStep - L6 x 6 min (B LE & R UE) Seated scap retraction into pool noodle at back of chair + shoulder extension & long-arm ER 10 x 3", 2 sets Seated scap retraction into pool noodle at back of chair + shoulder bent-arm ER - deferred d/t increased L shoulder pain Seated thoracic extension over back of chair (Airex pad in seat and feet elevated on 6" step for better alignment with back of chair) 10 x 3" - better tolerated with hands in lap/at sides than with arms folded over chest  MANUAL THERAPY: To promote normalized muscle tension, improved flexibility, improved joint mobility, increased ROM, and reduced pain. STM, manual TPR and pin & stretch to L UT, LS and scalenes   09/08/22 THERAPEUTIC EXERCISE: to improve flexibility, strength and mobility.  Verbal and tactile cues throughout for technique. NuStep - L4 x 6 min (B LE & R UE) Seated cervical retraction/chin tuck + scap retraction and depression 10 x 5" Seated scap retraction + B shoulder ER 10 x 5" Seated RTB row 10 x 3" - cues to avoid excessive shoulder extension and upward rotation of elbows Seated RTB retraction + shoulder extension to neutral 10 x 3"  MANUAL THERAPY: To promote normalized muscle tension, improved flexibility, improved joint mobility, increased ROM, and reduced pain. STM and manual TPR to L subscapularis, teres group and rhomboids STM and pin & stretch to L SCM, scalenes and cervical  paraspinals   PATIENT EDUCATION: Education details: PWR! Moves - seated & standing Person educated: Patient Education method: Explanation, Demonstration, and Verbal cues Education comprehension: verbalized understanding and returned demonstration   HOME EXERCISE PROGRAM: Access Code: [email or web address] URL: [email or web address] Date: 09/13/2022 Prepared by: Annie Paras  Exercises - Seated Cervical Retraction  - 2-3 x daily - 7 x weekly - 2 sets - 10 reps - 3-5 sec hold - Sternocleidomastoid Stretch (Mirrored)  - 2-3 x daily - 7 x weekly - 3 reps - 30 sec hold - Seated Scalene Stretch with Towel (Mirrored)  - 2-3 x daily - 7 x weekly - 3 reps - 30 sec hold - Seated Hamstring Stretch  - 2 x daily - 7 x weekly - 3 reps - 30 sec hold - Seated Thoracic Extension AROM  - 2 x daily - 7 x weekly - 2 sets - 10 reps - 3 sec hold  Patient Education - Check for Safety   ASSESSMENT:  CLINICAL IMPRESSION: Preslee reports flare-up of her neck pain the day following her last PT visit but uncertain of what may have been the trigger - pain started to settle back down by ~8 pm that evening and is less intense today. As pain is not as bad today, we shifted focus to PWR! Moves in sitting and standing with pt able to pick up rhythm very easily (she reports she has kept up with the PWR! Moves intermittently since her last PT episode) but some modifications necessary so as to not irritate her L shoulder. We attempted PWR! Walk today but she struggled more with coordinating reciprocal arm swing and also was limited by SOB - VS assessment revealing stable O2 sats at 98-100% but HR increasing to 130's with only walking ~80 ft. Encouraged her to bring this to her cardiologist's attention at her upcoming pre-op visit.  OBJECTIVE IMPAIRMENTS: Abnormal gait, cardiopulmonary status limiting activity, decreased activity tolerance, decreased balance, decreased coordination, decreased endurance, decreased  mobility, difficulty walking, decreased ROM, decreased strength, hypomobility, increased fascial restrictions, impaired perceived functional ability, increased muscle spasms, impaired flexibility, improper body mechanics, postural dysfunction, and pain.   ACTIVITY LIMITATIONS: carrying, lifting, sleeping, bed mobility, reach over head, and locomotion level  PARTICIPATION LIMITATIONS: meal prep, cleaning, laundry, shopping, and community activity  PERSONAL FACTORS: Age, Past/current experiences, Time since onset of injury/illness/exacerbation, and 3+ comorbidities: R shoulder advanced OA - scheduled for reverse TSA on 10/14/22; R TKA 07/2019, B THR 2018 & 2017, posterior C1-2 cervical fusion 2018, rupture of flexor tendon of L hand with failed surgical repair 2020, Parkinson's, bladder cancer, HTN   are also affecting patient's functional outcome.   REHAB POTENTIAL: Good  CLINICAL DECISION MAKING: Evolving/moderate complexity  EVALUATION COMPLEXITY: Moderate   GOALS: Goals reviewed with patient? Yes  SHORT TERM GOALS: Target date: 09/15/2022   Patient will be independent with initial HEP. Baseline:  Goal status: MET  09/13/22  2.  Patient will be educated on strategies to decrease risk of falls.  Baseline:  Goal status: MET  09/13/22  LONG TERM GOALS: Target date: 10/06/2022   Patient will be independent with ongoing/advanced HEP for self-management at home incorporating PWR! Moves as indicated .  Baseline:  Goal status: IN PROGRESS  2.  Patient will be  able to ambulate 600' with LRAD with good safety to access community.  Baseline:  Goal status: IN PROGRESS  3.  Patient will be able to step up/down curb safely with LRAD for safety with community ambulation.  Baseline:  Goal status: IN PROGRESS   4.  Patient to improve cervical AROM by >/= 25% with pain dereased by >/= 50% with movement to allow for increased participation in church services and improve safety with driving    Baseline:  Goal status: IN PROGRESS  5.  Patient will improve 5x STS time to </= 14.8 seconds to demonstrate improved functional strength and transfer efficiency per age appropriate norms. Baseline: 16.23 sec Goal status: IN PROGRESS  6.  Patient will demonstrate at least 24/30 on FGA to improve gait stability and reduce risk for falls. (MCID = 4 points) Baseline: 19/30 Goal status: IN PROGRESS  7.  Patient will improve Berg score to >/= 54/56 to improve safety and stability with ADLs in standing and reduce risk for falls.  Baseline: 51/56 Goal status: IN PROGRESS  9.  Patient will maintain or improve score on ABC scale to demonstrate improved balance confidence and decreased risk for falls. Baseline: 1470 / 1600 = 91.9% Goal status: IN PROGRESS  10. Patient will verbalize understanding of local Parkinson's disease community resources, including community fitness post d/c. Baseline:  Goal status: IN PROGRESS   PLAN: PT FREQUENCY: 2x/week  PT DURATION: 6 weeks  PLANNED INTERVENTIONS: Therapeutic exercises, Therapeutic activity, Neuromuscular re-education, Balance training, Gait training, Patient/Family education, Self Care, Joint mobilization, Dry Needling, Electrical stimulation, Spinal mobilization, Cryotherapy, Moist heat, Taping, Ultrasound, Ionotophoresis 29m/ml Dexamethasone, Manual therapy, and Re-evaluation  PLAN FOR NEXT SESSION:  monitor VS with exertion; gentle cervical ROM and stretching; MT +/- DN to address neck pain and abnormal muscle tension; review/update HEP PRN; continue to progress LE strengthening and PD related movements as neck pain better controlled and HR allows   JPercival Spanish PT 09/17/2022, 11:57 AM

## 2022-09-20 ENCOUNTER — Ambulatory Visit: Payer: Medicare Other | Admitting: Physical Therapy

## 2022-09-20 ENCOUNTER — Encounter: Payer: Self-pay | Admitting: Physical Therapy

## 2022-09-20 DIAGNOSIS — R2681 Unsteadiness on feet: Secondary | ICD-10-CM

## 2022-09-20 DIAGNOSIS — M542 Cervicalgia: Secondary | ICD-10-CM

## 2022-09-20 DIAGNOSIS — M25512 Pain in left shoulder: Secondary | ICD-10-CM | POA: Diagnosis not present

## 2022-09-20 DIAGNOSIS — M6281 Muscle weakness (generalized): Secondary | ICD-10-CM | POA: Diagnosis not present

## 2022-09-20 DIAGNOSIS — R2689 Other abnormalities of gait and mobility: Secondary | ICD-10-CM | POA: Diagnosis not present

## 2022-09-20 DIAGNOSIS — G20A1 Parkinson's disease without dyskinesia, without mention of fluctuations: Secondary | ICD-10-CM | POA: Diagnosis not present

## 2022-09-20 NOTE — Therapy (Signed)
OUTPATIENT PHYSICAL THERAPY TREATMENT   Patient Name: Stephanie [name] MRN: [id number] DOB:03-Jul-1932, 88 y.o., female Today's Date: 09/20/2022    PT End of Session - 09/20/22 1402     Visit Number 6    Date for PT Re-Evaluation 10/06/22    Authorization Type Medicare & BCBS    PT Start Time 1402    PT Stop Time 1447    PT Time Calculation (min) 45 min    Activity Tolerance Patient tolerated treatment well    Behavior During Therapy [facility] for tasks assessed/performed               Past Medical History:  Diagnosis Date   Allergy    Anemia    past hx    Arthropathy of cervical spine 11/27/2012   Right  Xray at [facility]  On 04/08/2016  AP, lateral, and lateral flexion and extension views of the cervical spine are submitted for evaluation.   No acute fracture identified in the cervical spine.   There is redemonstration of approximately 2 mm of C3 on C4 anterolisthesis in neutral which slightly increases in flexion relative to neutral and extension. Slight C5 on C6 retrolisthesis does not change i   Benign hypertension without congestive heart failure    Benign mole    right hcets mole, bleeds when dried with towel   Bilateral dry eyes    Bladder cancer ([place]) 20-Oct-2016   Cervical spondylosis    Cervicalgia    2 screws in place    Chronic kidney disease    kidney stones    Depression with anxiety Oct 20, 2016   prior to husbands death    Gross hematuria    developed after first hip replacment, led to indicental finding of bladder tumor, bleeding now resolved    History of kidney stones    1970's   Left hand weakness    due to reinjury of flxor tendon s/p tendon surgery march 12-2018   Lumbar stenosis    Malignant tumor of urinary bladder ([place]) 07/2016   Pre-stage I   OA (osteoarthritis)    Parkinson disease dx 2012   neurologist-  dr siddiqui at [place] [facility])     Dr Trudie Reed , [place] [facility]    Rupture of flexor tendon of hand 2020   right   Past  Surgical History:  Procedure Laterality Date   BIOPSY MASS LEFT FIRST METACARPAL   06/23/2006   benign   CATARACT EXTRACTION W/ INTRAOCULAR LENS  IMPLANT, BILATERAL  1999 and 2003   CERVICAL FUSION  02/2017   c1-c2 ; reports she has 2 screws 2in long in place done at [facility] ; patient exhibits VERY LIMITED NECK ROM   COLONOSCOPY     CYSTOSCOPY W/ RETROGRADES Bilateral 09/22/2016   Procedure: CYSTOSCOPY WITH RETROGRADE PYELOGRAM;  Surgeon: Alexis Frock, MD;  Location: [facility];  Service: Urology;  Laterality: Bilateral;   D & C HYSTEROSCOPY W/ RESECTION POLYP  07/11/2000   DILATION AND CURETTAGE OF UTERUS  1960's   EXCISION CYST AND DEBRIDEMENT RIGHT WIRST AND REMOVAL FORGEIGN BODY  01/09/2009   FLEXOR TENDON REPAIR Left 12/12/2018   Procedure: REPAIR/TRANSFER FLEXOR DIGITORUM PROFUNDUS OF LEFT SMALL FINGER;  Surgeon: Daryll Brod, MD;  Location: [place];  Service: Orthopedics;  Laterality: Left;   LAPAROSCOPY  yrs ago   infertility    METACARPOPHALANGEAL JOINT ARTHRODESIS Right 05/25/1996   TENDON REPAIR Left 01/15/2019   Hand  TONSILLECTOMY AND ADENOIDECTOMY  child   TOTAL HIP ARTHROPLASTY Left 07/07/2016   Procedure: LEFT TOTAL HIP ARTHROPLASTY ANTERIOR APPROACH;  Surgeon: Gaynelle Arabian, MD;  Location: [facility];  Service: Orthopedics;  Laterality: Left;   TOTAL HIP ARTHROPLASTY Right 09/28/2017   Procedure: RIGHT TOTAL HIP ARTHROPLASTY ANTERIOR APPROACH;  Surgeon: Gaynelle Arabian, MD;  Location: [facility];  Service: Orthopedics;  Laterality: Right;   TOTAL KNEE ARTHROPLASTY Right 07/30/2019   Procedure: TOTAL KNEE ARTHROPLASTY;  Surgeon: Gaynelle Arabian, MD;  Location: [facility];  Service: Orthopedics;  Laterality: Right;  1mn   TRANSURETHRAL RESECTION OF BLADDER TUMOR N/A 09/22/2016   Procedure: TRANSURETHRAL RESECTION OF BLADDER TUMOR (TURBT);  Surgeon: TAlexis Frock MD;  [facility]  Service: Urology;  Laterality: N/A;    Patient Active Problem List   Diagnosis Date Noted   Dyspnea 08/02/2022   Insomnia 08/02/2022   Hyponatremia 08/01/2022   Shoulder pain, left 09/14/2021   History of COVID-19 01/06/2021   Diarrhea 09/03/2020   Vitamin D deficiency 09/03/2020   OA (osteoarthritis) of knee 07/30/2019   Tinea corporis 06/08/2019   Psoriatic arthritis ([place] 04/02/2019   Anemia 03/13/2018   Hot flashes 03/13/2018   S/P cervical spinal fusion 03/31/2017   Neck pain 12/12/2016   Depression with anxiety 10/17/2016   [place]   OA (osteoarthritis) of hip 07/07/2016   Spinal stenosis of lumbar region with radiculopathy 04/08/2016   Spondylolisthesis of lumbar region 04/08/2016   Spondylosis of cervical region without myelopathy or radiculopathy 04/08/2016   Preventative health care 01/18/2016   Low back pain 01/18/2016   History of chicken pox    Hyperlipidemia 12/15/2014   Allergic rhinitis 06/25/2014   Allergy to bee sting 06/10/2014   TMJ tenderness 11/16/2013   Other malaise and fatigue 05/07/2013   [place] 11/27[place]2012   Muscle spasms of neck 11/27/2012   Parkinson's disease 05/24/2012   Conjunctivochalasis 03/09/2012   Epiretinal membrane 03/09/2012   Hyperopia with astigmatism and presbyopia 03/09/2012   Pseudophakia 03/09/2012   HTN (hypertension) 08/31/2011   Right knee pain 08/31/2011   Benign hypertensive heart disease without heart failure 05/12/2011    PCP: BMosie Lukes MD  REFERRING PROVIDER: SEldridge Abrahams MD   REFERRING DIAG: G20.A1 (ICD-10-CM) - Parkinson's disease without dyskinesia, without mention of fluctuations  THERAPY DIAG:  Other abnormalities of gait and mobility  Unsteadiness on feet  Muscle weakness (generalized)  Cervicalgia  RATIONALE FOR EVALUATION AND TREATMENT: Rehabilitation  ONSET DATE: Diagnosed with PD in ~2012  NEXT MD VISIT: 6 months with PA   SUBJECTIVE:      SUBJECTIVE  STATEMENT: Pt reports she woke with pain in her neck this morning - she put some hat on it and did her HEP and the pain resolved for a while but has since returned. She notes going for a walk this morning and doing some laundry has already worn her out today.  PAIN:  Are you having pain? Yes: NPRS scale: 3/10 Pain location: L side of neck and along spine Pain description: constant dull ache; sharp, shooting when more intense extending into L upper shoulder Aggravating factors: turning head to R, sleeping position Relieving factors: nothing  PERTINENT HISTORY:  L shoulder advanced OA - scheduled for reverse TSA on 10/14/22; R TKA 07/2019, B THR 2018 & 2017, posterior C1-2 cervical fusion 2018, rupture of flexor tendon of L hand with failed surgical repair 2020, Parkinson's, bladder cancer, HTN   PRECAUTIONS:  None  WEIGHT BEARING RESTRICTIONS No  FALLS: Has patient fallen in last 6 months? No  LIVING ENVIRONMENT: Lives with: lives alone Lives in: House/apartment Stairs: Yes: External: 4 steps; on right going up, on left going up, and can reach both to garage Has following equipment at home: Single point cane, Walker - 2 wheeled, shower chair, and Grab bars  OCCUPATION: Retired  PLOF: Independent and Leisure: daily stretching/exercises ~20 min/day; walk 1/4 mile daily; active in church - bible study    PATIENT GOALS: "To be be able to walk safely after my shoulder surgery."   OBJECTIVE:   DIAGNOSTIC FINDINGS:  N/A  COGNITION: Overall cognitive status: Within functional limits for tasks assessed   SENSATION: WFL  COORDINATION: WFL other than limitations in motion due to joint stiffness  PALPATION: Increased muscle tension and TTP in L>R SCM, cervical paraspinals, scalenes, UT and LS   CERVICAL ROM:    Active ROM AROM  09/02/22  Flexion 34  Extension 35  Right lateral flexion 21  Left lateral flexion 20  Right rotation 30  Left rotation 29   (Blank rows = not  tested)  UPPER EXTREMITY ROM:   Active ROM Right 09/02/22 Left 09/02/22  Shoulder flexion 168 53  Shoulder extension    Shoulder abduction 179 51  Shoulder adduction    Shoulder internal rotation    Shoulder external rotation 69 39  Elbow flexion    Elbow extension    Wrist flexion    Wrist extension    Wrist ulnar deviation    Wrist radial deviation    Wrist pronation    Wrist supination     (Blank rows = not tested)  UPPER EXTREMITY MMT:   MMT Right 09/02/22 Left NT d/t ROM restrictions  Shoulder flexion 4+   Shoulder extension 4+   Shoulder abduction 4+   Shoulder adduction    Shoulder internal rotation 5   Shoulder external rotation 4+   Middle trapezius    Lower trapezius    Elbow flexion    Elbow extension    Wrist flexion    Wrist extension    Wrist ulnar deviation    Wrist radial deviation    Wrist pronation    Wrist supination    Grip strength     (Blank rows = not tested)  CERVICAL SPECIAL TESTS:  Spurling's test: Negative and Distraction test: Negative  MUSCLE LENGTH: Hamstrings: mod tight B ITB: mild/mod tight R>L Piriformis: mod tight B Hip flexors: mild/mod tight B Quads: mild/mod tight B Heelcord: WFL  POSTURE:  rounded shoulders, forward head, decreased thoracic kyphosis, weight shift right, and scoliosis with R lateral shift of head and shoulders  LOWER EXTREMITY ROM:    Limited B hip ER but otherwise WFL  LOWER EXTREMITY MMT:    MMT Right Eval Left Eval  Hip flexion 4 4  Hip extension 4 4  Hip abduction 4 4  Hip adduction 4 4  Hip internal rotation 4+ 4+  Hip external rotation 4+ 4+  Knee flexion 5 5  Knee extension 5 5  Ankle dorsiflexion 4+ 4+  Ankle plantarflexion 5 5  Ankle inversion    Ankle eversion    (Blank rows = not tested)  BED MOBILITY:  Sit to supine Complete Independence Supine to sit Complete Independence Rolling to Right Complete Independence Rolling to Left Complete  Independence  TRANSFERS: Assistive device utilized: None  Sit to stand: Complete Independence Stand to sit: Complete Independence Chair to chair: Complete  Independence Floor:  NT  GAIT: Gait pattern: decreased arm swing- Right, decreased arm swing- Left, lateral lean- Right, and decreased trunk rotation Distance walked: 120 Assistive device utilized: None Level of assistance: Complete Independence Comments: Gait speed = 3.17 ft/sec  RAMP: Level of Assistance:  NT Assistive device utilized: None Ramp Comments:   CURB:  Level of Assistance:  NT Assistive device utilized: None Curb Comments:   STAIRS:  Level of Assistance: SBA  Stair Negotiation Technique: Alternating Pattern  with Single Rail on Right  Number of Stairs: 14   Height of Stairs: 7   FUNCTIONAL TESTS:  5 times sit to stand: 16.23 sec; >16 sec indicates fall risk with PD, >15 sec indicates risk for recurrent falls with geriatric population Timed up and go (TUG): 10.78 sec (Normal), 10.59 sec (Manual), 12.25 sec (Cognitive) 10 meter walk test: 10.34 sec (pt noting DOE), Gait speed: 3.17 ft/sec  Berg Balance Scale: 51/56; 46-51 moderate fall risk (>50%)  Functional gait assessment: 19/30; 19-24 = medium risk fall  Baselines as of D/C from last PT episode (02/2022): 5 times sit to stand: 12.53 sec Timed up and go (TUG): 8.88 sec (Normal) 10 meter walk test: 8.82 sec; Gait speed: 3.72 ft/sec  Berg Balance Scale: 52/56 Functional gait assessment: 26/30  PATIENT SURVEYS:  ABC scale 1470 / 1600 = 91.9 %   TODAY'S TREATMENT:  09/20/22 THERAPEUTIC EXERCISE: to improve flexibility, strength and mobility.  Verbal and tactile cues throughout for technique. NuStep - L6 x 6 min (B LE & R UE)  VS (after warm-up): BP 114/66 HR: 94 SpO2: 97% Seated RTB clam 10 x 3", 2 sets Seated RTB march 10 x 3", 2 sets Sit <> stand + RTB hip ABD isometric x 10 Seated hip ADD isometric ball squeeze 10 x 5", 2 sets Standing alt  hip ABD with looped RTB at ankles 10 x 3" Standing alt hip extension with looped RTB at ankles 10 x 3" Standing alt hip flexion with looped RTB at ankles 10 x 3"   09/16/22 THERAPEUTIC EXERCISE: to improve flexibility, strength and mobility.  Verbal and tactile cues throughout for technique. NuStep - L6 x 6 min (B LE & R UE)  NEUROMUSCULAR RE-EDUCATION: To improve posture, balance, coordination, amplitude of movement, speed of movement to reduce bradykinesia, and reduce rigidity. Seated PWR! Moves (Up, Reach, Twist & Step) x 10 each - PWR! Reach modified to just L LE extension avoiding :L UE reach to avoid shoulder irritation, some increased neck pain noted during PWR! Step Standing PWR Moves (Up, Reach, Twist & Step) x 10 each - PWR! Reach modified to only raise R UE reach to avoid L shoulder irritation PWR! Walk with exaggerated reciprocal arm swing 2 x 80 ft - pt struggling with coordination of reciprocal arm motion with stepping causing her to have to slow her pace with slight pause after each step; also limited by SOB - SpO2 98-100%, but HR increasing to 130's after only 1 rep   09/13/22 THERAPEUTIC EXERCISE: to improve flexibility, strength and mobility.  Verbal and tactile cues throughout for technique. NuStep - L6 x 6 min (B LE & R UE) Seated scap retraction into pool noodle at back of chair + shoulder extension & long-arm ER 10 x 3", 2 sets Seated scap retraction into pool noodle at back of chair + shoulder bent-arm ER - deferred d/t increased L shoulder pain Seated thoracic extension over back of chair (Airex pad in seat and feet elevated on  6" step for better alignment with back of chair) 10 x 3" - better tolerated with hands in lap/at sides than with arms folded over chest  MANUAL THERAPY: To promote normalized muscle tension, improved flexibility, improved joint mobility, increased ROM, and reduced pain. STM, manual TPR and pin & stretch to L UT, LS and scalenes   PATIENT  EDUCATION: Education details: PWR! Moves - seated & standing Person educated: Patient Education method: Explanation, Demonstration, and Verbal cues Education comprehension: verbalized understanding and returned demonstration   HOME EXERCISE PROGRAM: Access Code: [email or web address] URL: [email or web address] Date: 09/20/2022 Prepared by: Annie Paras  Exercises - Seated Cervical Retraction  - 2-3 x daily - 7 x weekly - 2 sets - 10 reps - 3-5 sec hold - Sternocleidomastoid Stretch (Mirrored)  - 2-3 x daily - 7 x weekly - 3 reps - 30 sec hold - Seated Scalene Stretch with Towel (Mirrored)  - 2-3 x daily - 7 x weekly - 3 reps - 30 sec hold - Seated Hamstring Stretch  - 2 x daily - 7 x weekly - 3 reps - 30 sec hold - Seated Thoracic Extension AROM  - 2 x daily - 7 x weekly - 2 sets - 10 reps - 3 sec hold - Seated Hip Adduction Isometrics with Ball  - 1 x daily - 3-4 x weekly - 2 sets - 10 reps - 3-5 sec hold - Seated March with Resistance  - 1 x daily - 3-4 x weekly - 2 sets - 10 reps - 3 sec hold - Seated Hip Abduction with Resistance  - 1 x daily - 3-4 x weekly - 2 sets - 10 reps - 3 sec hold  Patient Education - Check for Safety   ASSESSMENT:  CLINICAL IMPRESSION: Stephanie [name] reports she has been sleeping better recently. She has been able to get some relief of her neck pain utilizing heat and her HEP but pain tends to recur after a while. We progressed strengthening activities today targeting proximal LEs and core musculature. Pt noting increased effort and feeling of being winded when performing just 1 set of 10 reps of standing exercises more so than seated exercises - HR during seated exercises typically in the 90's while HR increasing to 100's-120's during single set of 10 reps with standing exercises. HEP updated with seated exercises but deferred standing exercises for HEP right now. Per pt request, will share her response to PT exercise and walking activities from last session (HR  increasing to 130's with only walking ~80 ft) with her cardiologist.  OBJECTIVE IMPAIRMENTS: Abnormal gait, cardiopulmonary status limiting activity, decreased activity tolerance, decreased balance, decreased coordination, decreased endurance, decreased mobility, difficulty walking, decreased ROM, decreased strength, hypomobility, increased fascial restrictions, impaired perceived functional ability, increased muscle spasms, impaired flexibility, improper body mechanics, postural dysfunction, and pain.   ACTIVITY LIMITATIONS: carrying, lifting, sleeping, bed mobility, reach over head, and locomotion level  PARTICIPATION LIMITATIONS: meal prep, cleaning, laundry, shopping, and community activity  PERSONAL FACTORS: Age, Past/current experiences, Time since onset of injury/illness/exacerbation, and 3+ comorbidities: R shoulder advanced OA - scheduled for reverse TSA on 10/14/22; R TKA 07/2019, B THR 2018 & 2017, posterior C1-2 cervical fusion 2018, rupture of flexor tendon of L hand with failed surgical repair 2020, Parkinson's, bladder cancer, HTN   are also affecting patient's functional outcome.   REHAB POTENTIAL: Good  CLINICAL DECISION MAKING: Evolving/moderate complexity  EVALUATION COMPLEXITY: Moderate   GOALS: Goals reviewed with patient? Yes  SHORT TERM GOALS: Target date:  09/15/2022   Patient will be independent with initial HEP. Baseline:  Goal status: MET  09/13/22  2.  Patient will be educated on strategies to decrease risk of falls.  Baseline:  Goal status: MET  09/13/22  LONG TERM GOALS: Target date: 10/06/2022   Patient will be independent with ongoing/advanced HEP for self-management at home incorporating PWR! Moves as indicated .  Baseline:  Goal status: IN PROGRESS  2.  Patient will be able to ambulate 600' with LRAD with good safety to access community.  Baseline:  Goal status: IN PROGRESS  3.  Patient will be able to step up/down curb safely with LRAD for  safety with community ambulation.  Baseline:  Goal status: IN PROGRESS   4.  Patient to improve cervical AROM by >/= 25% with pain dereased by >/= 50% with movement to allow for increased participation in church services and improve safety with driving   Baseline:  Goal status: IN PROGRESS  5.  Patient will improve 5x STS time to </= 14.8 seconds to demonstrate improved functional strength and transfer efficiency per age appropriate norms. Baseline: 16.23 sec Goal status: IN PROGRESS  6.  Patient will demonstrate at least 24/30 on FGA to improve gait stability and reduce risk for falls. (MCID = 4 points) Baseline: 19/30 Goal status: IN PROGRESS  7.  Patient will improve Berg score to >/= 54/56 to improve safety and stability with ADLs in standing and reduce risk for falls.  Baseline: 51/56 Goal status: IN PROGRESS  9.  Patient will maintain or improve score on ABC scale to demonstrate improved balance confidence and decreased risk for falls. Baseline: 1470 / 1600 = 91.9% Goal status: IN PROGRESS  10. Patient will verbalize understanding of local Parkinson's disease community resources, including community fitness post d/c. Baseline:  Goal status: IN PROGRESS   PLAN: PT FREQUENCY: 2x/week  PT DURATION: 6 weeks  PLANNED INTERVENTIONS: Therapeutic exercises, Therapeutic activity, Neuromuscular re-education, Balance training, Gait training, Patient/Family education, Self Care, Joint mobilization, Dry Needling, Electrical stimulation, Spinal mobilization, Cryotherapy, Moist heat, Taping, Ultrasound, Ionotophoresis 26m/ml Dexamethasone, Manual therapy, and Re-evaluation  PLAN FOR NEXT SESSION:  monitor VS with exertion; gentle cervical ROM and stretching; MT +/- DN to address neck pain and abnormal muscle tension; review/update HEP PRN; continue to progress LE strengthening and PD related movements as neck pain better controlled and HR allows   JPercival Spanish PT 09/20/2022, 4:04  PM

## 2022-09-21 ENCOUNTER — Telehealth: Payer: Self-pay

## 2022-09-21 DIAGNOSIS — R0609 Other forms of dyspnea: Secondary | ICD-10-CM

## 2022-09-21 NOTE — Telephone Encounter (Signed)
-----   Message from Sueanne Margarita, MD sent at 09/20/2022  4:26 PM EST ----- Nuclear stress test and echo stable - please refer to Pulmonary ----- Message ----- From: Percival Spanish, PT Sent: 09/20/2022   4:09 PM EST To: Sueanne Margarita, MD  Margaretha Sheffield requested that I share her response to activity with you: Pt noting increased effort and feeling of being winded when performing just 1 set of 10 reps of standing exercises more so than seated exercises - HR during seated exercises typically in the 90's while HR increasing to 100's-120's during single set of 10 reps with standing exercises. During walking activities in our last PT session her HR was increasing to 130's with only walking ~80 ft.

## 2022-09-21 NOTE — Telephone Encounter (Signed)
Patient returned call

## 2022-09-21 NOTE — Telephone Encounter (Signed)
Left message for patient to call back  

## 2022-09-21 NOTE — Telephone Encounter (Signed)
Called patient back to let her know of Dr. Theodosia Blender recommendation. Put in referral.

## 2022-09-23 ENCOUNTER — Encounter: Payer: Self-pay | Admitting: Physical Therapy

## 2022-09-23 ENCOUNTER — Ambulatory Visit: Payer: Medicare Other | Admitting: Physical Therapy

## 2022-09-23 DIAGNOSIS — M6281 Muscle weakness (generalized): Secondary | ICD-10-CM | POA: Diagnosis not present

## 2022-09-23 DIAGNOSIS — G8929 Other chronic pain: Secondary | ICD-10-CM

## 2022-09-23 DIAGNOSIS — R2681 Unsteadiness on feet: Secondary | ICD-10-CM

## 2022-09-23 DIAGNOSIS — M542 Cervicalgia: Secondary | ICD-10-CM | POA: Diagnosis not present

## 2022-09-23 DIAGNOSIS — G20A1 Parkinson's disease without dyskinesia, without mention of fluctuations: Secondary | ICD-10-CM

## 2022-09-23 DIAGNOSIS — R2689 Other abnormalities of gait and mobility: Secondary | ICD-10-CM

## 2022-09-23 DIAGNOSIS — R293 Abnormal posture: Secondary | ICD-10-CM

## 2022-09-23 DIAGNOSIS — M25612 Stiffness of left shoulder, not elsewhere classified: Secondary | ICD-10-CM

## 2022-09-23 DIAGNOSIS — M25512 Pain in left shoulder: Secondary | ICD-10-CM | POA: Diagnosis not present

## 2022-09-23 DIAGNOSIS — M546 Pain in thoracic spine: Secondary | ICD-10-CM

## 2022-09-23 NOTE — Therapy (Signed)
OUTPATIENT PHYSICAL THERAPY TREATMENT   Patient Name: Stephanie [name] MRN: [id number] DOB:1932/01/15, 88 y.o., female Today's Date: 09/23/2022    PT End of Session - 09/23/22 1401     Visit Number 7    Date for PT Re-Evaluation 10/06/22    Authorization Type Medicare & BCBS    PT Start Time 1402    PT Stop Time 1446    PT Time Calculation (min) 44 min    Activity Tolerance Patient tolerated treatment well    Behavior During Therapy [facility] for tasks assessed/performed               Past Medical History:  Diagnosis Date   Allergy    Anemia    past hx    Arthropathy of cervical spine 11/27/2012   Right  Xray at [facility]  On 04/08/2016  AP, lateral, and lateral flexion and extension views of the cervical spine are submitted for evaluation.   No acute fracture identified in the cervical spine.   There is redemonstration of approximately 2 mm of C3 on C4 anterolisthesis in neutral which slightly increases in flexion relative to neutral and extension. Slight C5 on C6 retrolisthesis does not change i   Benign hypertension without congestive heart failure    Benign mole    right hcets mole, bleeds when dried with towel   Bilateral dry eyes    Bladder cancer ([place]) 10-24-2016   Cervical spondylosis    Cervicalgia    2 screws in place    Chronic kidney disease    kidney stones    Depression with anxiety 2016/10/24   prior to husbands death    Gross hematuria    developed after first hip replacment, led to indicental finding of bladder tumor, bleeding now resolved    History of kidney stones    1970's   Left hand weakness    due to reinjury of flxor tendon s/p tendon surgery march 12-2018   Lumbar stenosis    Malignant tumor of urinary bladder ([place]) 07/2016   Pre-stage I   OA (osteoarthritis)    Parkinson disease dx 2012   neurologist-  dr siddiqui at [facility])     Dr Trudie Reed , [place] [facility]    Rupture of flexor tendon of hand 2020   right   Past  Surgical History:  Procedure Laterality Date   BIOPSY MASS LEFT FIRST METACARPAL   06/23/2006   benign   CATARACT EXTRACTION W/ INTRAOCULAR LENS  IMPLANT, BILATERAL  1999 and 2003   CERVICAL FUSION  02/2017   c1-c2 ; reports she has 2 screws 2in long in place done at [facility] ; patient exhibits VERY LIMITED NECK ROM   COLONOSCOPY     CYSTOSCOPY W/ RETROGRADES Bilateral 09/22/2016   Procedure: CYSTOSCOPY WITH RETROGRADE PYELOGRAM;  Surgeon: Alexis Frock, MD;  Location: [facility];  Service: Urology;  Laterality: Bilateral;   D & C HYSTEROSCOPY W/ RESECTION POLYP  07/11/2000   DILATION AND CURETTAGE OF UTERUS  1960's   EXCISION CYST AND DEBRIDEMENT RIGHT WIRST AND REMOVAL FORGEIGN BODY  01/09/2009   FLEXOR TENDON REPAIR Left 12/12/2018   Procedure: REPAIR/TRANSFER FLEXOR DIGITORUM PROFUNDUS OF LEFT SMALL FINGER;  Surgeon: Daryll Brod, MD;  Location: [facility];  Service: Orthopedics;  Laterality: Left;   LAPAROSCOPY  yrs ago   infertility    METACARPOPHALANGEAL JOINT ARTHRODESIS Right 05/25/1996   TENDON REPAIR Left 01/15/2019   Hand  TONSILLECTOMY AND ADENOIDECTOMY  child   TOTAL HIP ARTHROPLASTY Left 07/07/2016   Procedure: LEFT TOTAL HIP ARTHROPLASTY ANTERIOR APPROACH;  Surgeon: Gaynelle Arabian, MD;  Location: [facility];  Service: Orthopedics;  Laterality: Left;   TOTAL HIP ARTHROPLASTY Right 09/28/2017   Procedure: RIGHT TOTAL HIP ARTHROPLASTY ANTERIOR APPROACH;  Surgeon: Gaynelle Arabian, MD;  Location: [facility];  Service: Orthopedics;  Laterality: Right;   TOTAL KNEE ARTHROPLASTY Right 07/30/2019   Procedure: TOTAL KNEE ARTHROPLASTY;  Surgeon: Gaynelle Arabian, MD;  Location: [facility];  Service: Orthopedics;  Laterality: Right;  29mn   TRANSURETHRAL RESECTION OF BLADDER TUMOR N/A 09/22/2016   Procedure: TRANSURETHRAL RESECTION OF BLADDER TUMOR (TURBT);  Surgeon: TAlexis Frock MD;  [facility]  Service: Urology;  Laterality: N/A;    Patient Active Problem List   Diagnosis Date Noted   Dyspnea 08/02/2022   Insomnia 08/02/2022   Hyponatremia 08/01/2022   Shoulder pain, left 09/14/2021   History of COVID-19 01/06/2021   Diarrhea 09/03/2020   Vitamin D deficiency 09/03/2020   OA (osteoarthritis) of knee 07/30/2019   Tinea corporis 06/08/2019   Psoriatic arthritis ([place] 04/02/2019   Anemia 03/13/2018   Hot flashes 03/13/2018   S/P cervical spinal fusion 03/31/2017   Neck pain 12/12/2016   Depression with anxiety 10/17/2016   [place]/17[place]   OA (osteoarthritis) of hip 07/07/2016   Spinal stenosis of lumbar region with radiculopathy 04/08/2016   Spondylolisthesis of lumbar region 04/08/2016   Spondylosis of cervical region without myelopathy or radiculopathy 04/08/2016   Preventative health care 01/18/2016   Low back pain 01/18/2016   History of chicken pox    Hyperlipidemia 12/15/2014   Allergic rhinitis 06/25/2014   Allergy to bee sting 06/10/2014   TMJ tenderness 11/16/2013   Other malaise and fatigue 05/07/2013   Arthropathy of cervical spine ([place] 11/27/2012   Muscle spasms of neck 11/27/2012   Parkinson's disease 05/24/2012   Conjunctivochalasis 03/09/2012   Epiretinal membrane 03/09/2012   Hyperopia with astigmatism and presbyopia 03/09/2012   Pseudophakia 03/09/2012   HTN (hypertension) 08/31/2011   Right knee pain 08/31/2011   Benign hypertensive heart disease without heart failure 05/12/2011    PCP: BMosie Lukes MD  REFERRING PROVIDER: SEldridge Abrahams MD   REFERRING DIAG: G20.A1 (ICD-10-CM) - Parkinson's disease without dyskinesia, without mention of fluctuations  THERAPY DIAG:  Other abnormalities of gait and mobility  Unsteadiness on feet  Muscle weakness (generalized)  Cervicalgia  Parkinson's disease, unspecified whether dyskinesia present, unspecified whether manifestations fluctuate  Chronic left shoulder pain  Stiffness of left shoulder, not  elsewhere classified  Pain in thoracic spine  Abnormal posture  RATIONALE FOR EVALUATION AND TREATMENT: Rehabilitation  ONSET DATE: Diagnosed with PD in ~2012  NEXT MD VISIT: 6 months with PA   SUBJECTIVE:      SUBJECTIVE STATEMENT: Pt states she didn't sleep well and woke up with pain in her neck, took a nap in her recliner and the pain eased. She attempted to do HEP this morning but wasn't able to.  PAIN:  Are you having pain? Yes: NPRS scale: 3/10 Pain location: L side of neck and shoulder Pain description: dull ache Aggravating factors: movement Relieving factors: rest  PERTINENT HISTORY:  L shoulder advanced OA - scheduled for reverse TSA on 10/14/22; R TKA 07/2019, B THR 2018 & 2017, posterior C1-2 cervical fusion 2018, rupture of flexor tendon of L hand with failed surgical repair 2020, Parkinson's, bladder cancer, HTN   PRECAUTIONS: None  WEIGHT BEARING RESTRICTIONS No  FALLS: Has patient fallen in last 6 months? No  LIVING ENVIRONMENT: Lives with: lives alone Lives in: House/apartment Stairs: Yes: External: 4 steps; on right going up, on left going up, and can reach both to garage Has following equipment at home: Single point cane, Walker - 2 wheeled, shower chair, and Grab bars  OCCUPATION: Retired  PLOF: Independent and Leisure: daily stretching/exercises ~20 min/day; walk 1/4 mile daily; active in church - bible study    PATIENT GOALS: "To be be able to walk safely after my shoulder surgery."   OBJECTIVE:   DIAGNOSTIC FINDINGS:  N/A  COGNITION: Overall cognitive status: Within functional limits for tasks assessed   SENSATION: WFL  COORDINATION: WFL other than limitations in motion due to joint stiffness  PALPATION: Increased muscle tension and TTP in L>R SCM, cervical paraspinals, scalenes, UT and LS   CERVICAL ROM:    Active ROM AROM  09/02/22  Flexion 34  Extension 35  Right lateral flexion 21  Left lateral flexion 20  Right  rotation 30  Left rotation 29   (Blank rows = not tested)  UPPER EXTREMITY ROM:   Active ROM Right 09/02/22 Left 09/02/22  Shoulder flexion 168 53  Shoulder extension    Shoulder abduction 179 51  Shoulder adduction    Shoulder internal rotation    Shoulder external rotation 69 39  Elbow flexion    Elbow extension    Wrist flexion    Wrist extension    Wrist ulnar deviation    Wrist radial deviation    Wrist pronation    Wrist supination     (Blank rows = not tested)  UPPER EXTREMITY MMT:   MMT Right 09/02/22 Left NT d/t ROM restrictions  Shoulder flexion 4+   Shoulder extension 4+   Shoulder abduction 4+   Shoulder adduction    Shoulder internal rotation 5   Shoulder external rotation 4+   Middle trapezius    Lower trapezius    Elbow flexion    Elbow extension    Wrist flexion    Wrist extension    Wrist ulnar deviation    Wrist radial deviation    Wrist pronation    Wrist supination    Grip strength     (Blank rows = not tested)  CERVICAL SPECIAL TESTS:  Spurling's test: Negative and Distraction test: Negative  MUSCLE LENGTH: Hamstrings: mod tight B ITB: mild/mod tight R>L Piriformis: mod tight B Hip flexors: mild/mod tight B Quads: mild/mod tight B Heelcord: WFL  POSTURE:  rounded shoulders, forward head, decreased thoracic kyphosis, weight shift right, and scoliosis with R lateral shift of head and shoulders  LOWER EXTREMITY ROM:    Limited B hip ER but otherwise WFL  LOWER EXTREMITY MMT:    MMT Right Eval Left Eval  Hip flexion 4 4  Hip extension 4 4  Hip abduction 4 4  Hip adduction 4 4  Hip internal rotation 4+ 4+  Hip external rotation 4+ 4+  Knee flexion 5 5  Knee extension 5 5  Ankle dorsiflexion 4+ 4+  Ankle plantarflexion 5 5  Ankle inversion    Ankle eversion    (Blank rows = not tested)  BED MOBILITY:  Sit to supine Complete Independence Supine to sit Complete Independence Rolling to Right Complete  Independence Rolling to Left Complete Independence  TRANSFERS: Assistive device utilized: None  Sit to stand: Complete Independence Stand to sit: Complete Independence Chair to chair: Complete Independence Floor:  NT  GAIT: Gait pattern: decreased arm swing- Right, decreased arm swing- Left, lateral lean- Right, and decreased trunk rotation Distance walked: 120 Assistive device utilized: None Level of assistance: Complete Independence Comments: Gait speed = 3.17 ft/sec  RAMP: Level of Assistance:  NT Assistive device utilized: None Ramp Comments:   CURB:  Level of Assistance:  NT Assistive device utilized: None Curb Comments:   STAIRS:  Level of Assistance: SBA  Stair Negotiation Technique: Alternating Pattern  with Single Rail on Right  Number of Stairs: 14   Height of Stairs: 7   FUNCTIONAL TESTS:  5 times sit to stand: 16.23 sec; >16 sec indicates fall risk with PD, >15 sec indicates risk for recurrent falls with geriatric population Timed up and go (TUG): 10.78 sec (Normal), 10.59 sec (Manual), 12.25 sec (Cognitive) 10 meter walk test: 10.34 sec (pt noting DOE), Gait speed: 3.17 ft/sec  Berg Balance Scale: 51/56; 46-51 moderate fall risk (>50%)  Functional gait assessment: 19/30; 19-24 = medium risk fall  Baselines as of D/C from last PT episode (02/2022): 5 times sit to stand: 12.53 sec Timed up and go (TUG): 8.88 sec (Normal) 10 meter walk test: 8.82 sec; Gait speed: 3.72 ft/sec  Berg Balance Scale: 52/56 Functional gait assessment: 26/30  PATIENT SURVEYS:  ABC scale 1470 / 1600 = 91.9 %   TODAY'S TREATMENT: 09/23/22 THERAPEUTIC EXERCISE: to improve flexibility, strength and mobility.  Verbal and tactile cues throughout for technique. NuStep L5 x 6 min (B LE & R UE)  HR (after warm up): 105 Seated hip ADD isometric ball squeeze with 5 sec hold x10- cues to hold for complete 5 secs Seated hip ADD isometric ball squeeze + LAQ 2x10 Standing alt hip ABD with  RTB BLE x10 each side Standing alt hip extension with RTB BLE x10 each side  HR (after standing hip extension) -118 bpm Standing alt hip flexion with RTB BLE x10 each side  HR (after standing hip flexion) -117 bpm  NEUROMUSCULAR RE-EDUCATION: To improve posture, balance, reduce fall risk, and amplitude of movement. Seated PWR Moves- Up, Rock, Step x10 each side- unable to tolerate PWR twist due to L shoulder pain, PWR rock modified to not use L UE Standing PWR Moves-Up STS, Rock, Step x10 each side- modified PWR rock and step without using L UE due to pain  HR -122 After PWR Up   09/20/22 THERAPEUTIC EXERCISE: to improve flexibility, strength and mobility.  Verbal and tactile cues throughout for technique. NuStep - L6 x 6 min (B LE & R UE)  VS (after warm-up): BP 114/66 HR: 94 SpO2: 97% Seated RTB clam 10 x 3", 2 sets Seated RTB march 10 x 3", 2 sets Sit <> stand + RTB hip ABD isometric x 10 Seated hip ADD isometric ball squeeze 10 x 5", 2 sets Standing alt hip ABD with looped RTB at ankles 10 x 3" Standing alt hip extension with looped RTB at ankles 10 x 3" Standing alt hip flexion with looped RTB at ankles 10 x 3"   09/16/22 THERAPEUTIC EXERCISE: to improve flexibility, strength and mobility.  Verbal and tactile cues throughout for technique. NuStep - L6 x 6 min (B LE & R UE)  NEUROMUSCULAR RE-EDUCATION: To improve posture, balance, coordination, amplitude of movement, speed of movement to reduce bradykinesia, and reduce rigidity. Seated PWR! Moves (Up, Reach, Twist & Step) x 10 each - PWR! Reach modified to just L LE extension avoiding :L UE reach to avoid shoulder irritation, some  increased neck pain noted during PWR! Step Standing PWR Moves (Up, Reach, Twist & Step) x 10 each - PWR! Reach modified to only raise R UE reach to avoid L shoulder irritation PWR! Walk with exaggerated reciprocal arm swing 2 x 80 ft - pt struggling with coordination of reciprocal arm motion with  stepping causing her to have to slow her pace with slight pause after each step; also limited by SOB - SpO2 98-100%, but HR increasing to 130's after only 1 rep   PATIENT EDUCATION: Education details: PWR! Moves - seated & standing Person educated: Patient Education method: Explanation, Demonstration, and Verbal cues Education comprehension: verbalized understanding and returned demonstration   HOME EXERCISE PROGRAM: Access Code: [email or web address] URL: [email or web address] Date: 09/20/2022 Prepared by: Annie Paras  Exercises - Seated Cervical Retraction  - 2-3 x daily - 7 x weekly - 2 sets - 10 reps - 3-5 sec hold - Sternocleidomastoid Stretch (Mirrored)  - 2-3 x daily - 7 x weekly - 3 reps - 30 sec hold - Seated Scalene Stretch with Towel (Mirrored)  - 2-3 x daily - 7 x weekly - 3 reps - 30 sec hold - Seated Hamstring Stretch  - 2 x daily - 7 x weekly - 3 reps - 30 sec hold - Seated Thoracic Extension AROM  - 2 x daily - 7 x weekly - 2 sets - 10 reps - 3 sec hold - Seated Hip Adduction Isometrics with Ball  - 1 x daily - 3-4 x weekly - 2 sets - 10 reps - 3-5 sec hold - Seated March with Resistance  - 1 x daily - 3-4 x weekly - 2 sets - 10 reps - 3 sec hold - Seated Hip Abduction with Resistance  - 1 x daily - 3-4 x weekly - 2 sets - 10 reps - 3 sec hold  Patient Education - Check for Safety   ASSESSMENT:  CLINICAL IMPRESSION: Emmalena reports that her shoulder is more irritated today then it has been recently but she was still able to tolerate therapy well. Session focused on TE to increase strength of LE muscles but noticed increase in pt effort with HR rising to 120s during standing activity. She performed PWR Moves for balance and needed cueing for large open movements with modification to not use L UE due to increased shoulder pain at 90 degree shoulder abd. Continued skilled therapy to address impairments and monitoring of vitals.    OBJECTIVE IMPAIRMENTS: Abnormal  gait, cardiopulmonary status limiting activity, decreased activity tolerance, decreased balance, decreased coordination, decreased endurance, decreased mobility, difficulty walking, decreased ROM, decreased strength, hypomobility, increased fascial restrictions, impaired perceived functional ability, increased muscle spasms, impaired flexibility, improper body mechanics, postural dysfunction, and pain.   ACTIVITY LIMITATIONS: carrying, lifting, sleeping, bed mobility, reach over head, and locomotion level  PARTICIPATION LIMITATIONS: meal prep, cleaning, laundry, shopping, and community activity  PERSONAL FACTORS: Age, Past/current experiences, Time since onset of injury/illness/exacerbation, and 3+ comorbidities: R shoulder advanced OA - scheduled for reverse TSA on 10/14/22; R TKA 07/2019, B THR 2018 & 2017, posterior C1-2 cervical fusion 2018, rupture of flexor tendon of L hand with failed surgical repair 2020, Parkinson's, bladder cancer, HTN   are also affecting patient's functional outcome.   REHAB POTENTIAL: Good  CLINICAL DECISION MAKING: Evolving/moderate complexity  EVALUATION COMPLEXITY: Moderate   GOALS: Goals reviewed with patient? Yes  SHORT TERM GOALS: Target date: 09/15/2022   Patient will be independent with initial HEP. Baseline:  Goal status: MET  09/13/22  2.  Patient will be educated on strategies to decrease risk of falls.  Baseline:  Goal status: MET  09/13/22  LONG TERM GOALS: Target date: 10/06/2022   Patient will be independent with ongoing/advanced HEP for self-management at home incorporating PWR! Moves as indicated .  Baseline:  Goal status: IN PROGRESS  2.  Patient will be able to ambulate 600' with LRAD with good safety to access community.  Baseline:  Goal status: IN PROGRESS  3.  Patient will be able to step up/down curb safely with LRAD for safety with community ambulation.  Baseline:  Goal status: IN PROGRESS   4.  Patient to improve cervical  AROM by >/= 25% with pain dereased by >/= 50% with movement to allow for increased participation in church services and improve safety with driving   Baseline:  Goal status: IN PROGRESS  5.  Patient will improve 5x STS time to </= 14.8 seconds to demonstrate improved functional strength and transfer efficiency per age appropriate norms. Baseline: 16.23 sec Goal status: IN PROGRESS  6.  Patient will demonstrate at least 24/30 on FGA to improve gait stability and reduce risk for falls. (MCID = 4 points) Baseline: 19/30 Goal status: IN PROGRESS  7.  Patient will improve Berg score to >/= 54/56 to improve safety and stability with ADLs in standing and reduce risk for falls.  Baseline: 51/56 Goal status: IN PROGRESS  9.  Patient will maintain or improve score on ABC scale to demonstrate improved balance confidence and decreased risk for falls. Baseline: 1470 / 1600 = 91.9% Goal status: IN PROGRESS  10. Patient will verbalize understanding of local Parkinson's disease community resources, including community fitness post d/c. Baseline:  Goal status: IN PROGRESS   PLAN: PT FREQUENCY: 2x/week  PT DURATION: 6 weeks  PLANNED INTERVENTIONS: Therapeutic exercises, Therapeutic activity, Neuromuscular re-education, Balance training, Gait training, Patient/Family education, Self Care, Joint mobilization, Dry Needling, Electrical stimulation, Spinal mobilization, Cryotherapy, Moist heat, Taping, Ultrasound, Ionotophoresis 63m/ml Dexamethasone, Manual therapy, and Re-evaluation  PLAN FOR NEXT SESSION:  monitor VS at baseline and with exertion; gentle cervical ROM and stretching; MT +/- DN to address neck pain and abnormal muscle tension; review/update HEP PRN; continue to progress LE strengthening and PD related movements as neck pain better controlled and HR allows   OZeb Comfort Student-PT 09/23/2022, 2:56 PM

## 2022-09-27 ENCOUNTER — Ambulatory Visit: Payer: Medicare Other | Admitting: Physical Therapy

## 2022-09-27 ENCOUNTER — Ambulatory Visit: Payer: Self-pay | Admitting: Licensed Clinical Social Worker

## 2022-09-27 ENCOUNTER — Encounter: Payer: Self-pay | Admitting: Physical Therapy

## 2022-09-27 DIAGNOSIS — M542 Cervicalgia: Secondary | ICD-10-CM

## 2022-09-27 DIAGNOSIS — R2689 Other abnormalities of gait and mobility: Secondary | ICD-10-CM | POA: Diagnosis not present

## 2022-09-27 DIAGNOSIS — M6281 Muscle weakness (generalized): Secondary | ICD-10-CM | POA: Diagnosis not present

## 2022-09-27 DIAGNOSIS — G20A1 Parkinson's disease without dyskinesia, without mention of fluctuations: Secondary | ICD-10-CM | POA: Diagnosis not present

## 2022-09-27 DIAGNOSIS — R2681 Unsteadiness on feet: Secondary | ICD-10-CM

## 2022-09-27 DIAGNOSIS — M25512 Pain in left shoulder: Secondary | ICD-10-CM | POA: Diagnosis not present

## 2022-09-27 NOTE — Therapy (Signed)
OUTPATIENT PHYSICAL THERAPY TREATMENT   Patient Name: Stephanie [name] MRN: [id number] DOB:Aug 26, 1932, 88 y.o., female Today's Date: 09/27/2022    PT End of Session - 09/27/22 1314     Visit Number 8    Date for PT Re-Evaluation 10/06/22    Authorization Type Medicare & BCBS    PT Start Time 1314    PT Stop Time 1358    PT Time Calculation (min) 44 min    Activity Tolerance Patient tolerated treatment well    Behavior During Therapy [facility] for tasks assessed/performed               Past Medical History:  Diagnosis Date   Allergy    Anemia    past hx    Arthropathy of cervical spine 11/27/2012   Right  Xray at [facility]  On 04/08/2016  AP, lateral, and lateral flexion and extension views of the cervical spine are submitted for evaluation.   No acute fracture identified in the cervical spine.   There is redemonstration of approximately 2 mm of C3 on C4 anterolisthesis in neutral which slightly increases in flexion relative to neutral and extension. Slight C5 on C6 retrolisthesis does not change i   Benign hypertension without congestive heart failure    Benign mole    right hcets mole, bleeds when dried with towel   Bilateral dry eyes    Bladder cancer ([place]) October 22, 2016   Cervical spondylosis    Cervicalgia    2 screws in place    Chronic kidney disease    kidney stones    Depression with anxiety October 22, 2016   prior to husbands death    Gross hematuria    developed after first hip replacment, led to indicental finding of bladder tumor, bleeding now resolved    History of kidney stones    1970's   Left hand weakness    due to reinjury of flxor tendon s/p tendon surgery march 12-2018   Lumbar stenosis    Malignant tumor of urinary bladder ([place]) 07/2016   Pre-stage I   OA (osteoarthritis)    Parkinson disease dx 2012   neurologist-  dr siddiqui at [place] [facility])     Dr Trudie Reed , [place] [facility]    Rupture of flexor tendon of hand 2020   right   Past  Surgical History:  Procedure Laterality Date   BIOPSY MASS LEFT FIRST METACARPAL   06/23/2006   benign   CATARACT EXTRACTION W/ INTRAOCULAR LENS  IMPLANT, BILATERAL  1999 and 2003   CERVICAL FUSION  02/2017   c1-c2 ; reports she has 2 screws 2in long in place done at [facility] ; patient exhibits VERY LIMITED NECK ROM   COLONOSCOPY     CYSTOSCOPY W/ RETROGRADES Bilateral 09/22/2016   Procedure: CYSTOSCOPY WITH RETROGRADE PYELOGRAM;  Surgeon: Alexis Frock, MD;  Location: [facility];  Service: Urology;  Laterality: Bilateral;   D & C HYSTEROSCOPY W/ RESECTION POLYP  07/11/2000   DILATION AND CURETTAGE OF UTERUS  1960's   EXCISION CYST AND DEBRIDEMENT RIGHT WIRST AND REMOVAL FORGEIGN BODY  01/09/2009   FLEXOR TENDON REPAIR Left 12/12/2018   Procedure: REPAIR/TRANSFER FLEXOR DIGITORUM PROFUNDUS OF LEFT SMALL FINGER;  Surgeon: Daryll Brod, MD;  Location: [place];  Service: Orthopedics;  Laterality: Left;   LAPAROSCOPY  yrs ago   infertility    METACARPOPHALANGEAL JOINT ARTHRODESIS Right 05/25/1996   TENDON REPAIR Left 01/15/2019   Hand  TONSILLECTOMY AND ADENOIDECTOMY  child   TOTAL HIP ARTHROPLASTY Left 07/07/2016   Procedure: LEFT TOTAL HIP ARTHROPLASTY ANTERIOR APPROACH;  Surgeon: Gaynelle Arabian, MD;  Location: [facility];  Service: Orthopedics;  Laterality: Left;   TOTAL HIP ARTHROPLASTY Right 09/28/2017   Procedure: RIGHT TOTAL HIP ARTHROPLASTY ANTERIOR APPROACH;  Surgeon: Gaynelle Arabian, MD;  Location: [facility];  Service: Orthopedics;  Laterality: Right;   TOTAL KNEE ARTHROPLASTY Right 07/30/2019   Procedure: TOTAL KNEE ARTHROPLASTY;  Surgeon: Gaynelle Arabian, MD;  Location: [facility];  Service: Orthopedics;  Laterality: Right;  33mn   TRANSURETHRAL RESECTION OF BLADDER TUMOR N/A 09/22/2016   Procedure: TRANSURETHRAL RESECTION OF BLADDER TUMOR (TURBT);  Surgeon: TAlexis Frock MD;  [facility]  Service: Urology;  Laterality: N/A;    Patient Active Problem List   Diagnosis Date Noted   Dyspnea 08/02/2022   Insomnia 08/02/2022   Hyponatremia 08/01/2022   Shoulder pain, left 09/14/2021   History of COVID-19 01/06/2021   Diarrhea 09/03/2020   Vitamin D deficiency 09/03/2020   OA (osteoarthritis) of knee 07/30/2019   Tinea corporis 06/08/2019   [place]ori[place] ([place] 04/02/2019   Anemia 03/13/2018   Hot flashes 03/13/2018   S/P cervical spinal fusion 03/31/2017   Neck pain 12/12/2016   Depression with anxiety 10/17/2016   [place] 10/17/2016   OA (osteoarthritis) of hip 07/07/2016   Spinal stenosis of lumbar region with radiculopathy 04/08/2016   Spondylolisthesis of lumbar region 04/08/2016   Spondylosis of cervical region without myelopathy or radiculopathy 04/08/2016   Preventative health care 01/18/2016   Low back pain 01/18/2016   History of chicken pox    Hyperlipidemia 12/15/2014   Allergic rhinitis 06/25/2014   Allergy to bee sting 06/10/2014   TMJ tenderness 11/16/2013   Other malaise and fatigue 05/07/2013   Arthropathy of cervical spine ([place] 11/27/2012   Muscle spasms of neck 11/27/2012   Parkinson's disease 05/24/2012   Conjunctivochalasis 03/09/2012   Epiretinal membrane 03/09/2012   Hyperopia with astigmatism and presbyopia 03/09/2012   Pseudophakia 03/09/2012   HTN (hypertension) 08/31/2011   Right knee pain 08/31/2011   Benign hypertensive heart disease without heart failure 05/12/2011    PCP: BMosie Lukes MD  REFERRING PROVIDER: SEldridge Abrahams MD   REFERRING DIAG: G20.A1 (ICD-10-CM) - Parkinson's disease without dyskinesia, without mention of fluctuations  THERAPY DIAG:  Other abnormalities of gait and mobility  Unsteadiness on feet  Muscle weakness (generalized)  Cervicalgia  RATIONALE FOR EVALUATION AND TREATMENT: Rehabilitation  ONSET DATE: Diagnosed with PD in ~2012  NEXT MD VISIT: 6 months with PA   SUBJECTIVE:      SUBJECTIVE  STATEMENT: Pt states she went for a walk this morning which went well. Used heat on her neck and did her exercises following the walk - no L neck/shoulder pain currently, but states her shoulder blade area was bothering her in church yesterday.  PAIN:  Are you having pain? No  PERTINENT HISTORY:  L shoulder advanced OA - scheduled for reverse TSA on 10/14/22; R TKA 07/2019, B THR 2018 & 2017, posterior C1-2 cervical fusion 2018, rupture of flexor tendon of L hand with failed surgical repair 2020, Parkinson's, bladder cancer, HTN   PRECAUTIONS: None  WEIGHT BEARING RESTRICTIONS No  FALLS: Has patient fallen in last 6 months? No  LIVING ENVIRONMENT: Lives with: lives alone Lives in: House/apartment Stairs: Yes: External: 4 steps; on right going up, on left going up, and can reach both to garage Has following  equipment at home: Single point cane, Walker - 2 wheeled, shower chair, and Grab bars  OCCUPATION: Retired  PLOF: Independent and Leisure: daily stretching/exercises ~20 min/day; walk 1/4 mile daily; active in church - bible study    PATIENT GOALS: "To be be able to walk safely after my shoulder surgery."   OBJECTIVE:   DIAGNOSTIC FINDINGS:  N/A  COGNITION: Overall cognitive status: Within functional limits for tasks assessed   SENSATION: WFL  COORDINATION: WFL other than limitations in motion due to joint stiffness  PALPATION: Increased muscle tension and TTP in L>R SCM, cervical paraspinals, scalenes, UT and LS   CERVICAL ROM:    Active ROM AROM  09/02/22  Flexion 34  Extension 35  Right lateral flexion 21  Left lateral flexion 20  Right rotation 30  Left rotation 29   (Blank rows = not tested)  UPPER EXTREMITY ROM:   Active ROM Right 09/02/22 Left 09/02/22  Shoulder flexion 168 53  Shoulder extension    Shoulder abduction 179 51  Shoulder adduction    Shoulder internal rotation    Shoulder external rotation 69 39  Elbow flexion    Elbow  extension    Wrist flexion    Wrist extension    Wrist ulnar deviation    Wrist radial deviation    Wrist pronation    Wrist supination     (Blank rows = not tested)  UPPER EXTREMITY MMT:   MMT Right 09/02/22 Left NT d/t ROM restrictions  Shoulder flexion 4+   Shoulder extension 4+   Shoulder abduction 4+   Shoulder adduction    Shoulder internal rotation 5   Shoulder external rotation 4+   Middle trapezius    Lower trapezius    Elbow flexion    Elbow extension    Wrist flexion    Wrist extension    Wrist ulnar deviation    Wrist radial deviation    Wrist pronation    Wrist supination    Grip strength     (Blank rows = not tested)  CERVICAL SPECIAL TESTS:  Spurling's test: Negative and Distraction test: Negative  MUSCLE LENGTH: Hamstrings: mod tight B ITB: mild/mod tight R>L Piriformis: mod tight B Hip flexors: mild/mod tight B Quads: mild/mod tight B Heelcord: WFL  POSTURE:  rounded shoulders, forward head, decreased thoracic kyphosis, weight shift right, and scoliosis with R lateral shift of head and shoulders  LOWER EXTREMITY ROM:    Limited B hip ER but otherwise WFL  LOWER EXTREMITY MMT:    MMT Right Eval Left Eval  Hip flexion 4 4  Hip extension 4 4  Hip abduction 4 4  Hip adduction 4 4  Hip internal rotation 4+ 4+  Hip external rotation 4+ 4+  Knee flexion 5 5  Knee extension 5 5  Ankle dorsiflexion 4+ 4+  Ankle plantarflexion 5 5  Ankle inversion    Ankle eversion    (Blank rows = not tested)  BED MOBILITY:  Sit to supine Complete Independence Supine to sit Complete Independence Rolling to Right Complete Independence Rolling to Left Complete Independence  TRANSFERS: Assistive device utilized: None  Sit to stand: Complete Independence Stand to sit: Complete Independence Chair to chair: Complete Independence Floor:  NT  GAIT: Gait pattern: decreased arm swing- Right, decreased arm swing- Left, lateral lean- Right, and decreased  trunk rotation Distance walked: 120 Assistive device utilized: None Level of assistance: Complete Independence Comments: Gait speed = 3.17 ft/sec  RAMP: Level of Assistance:  NT Assistive device utilized: None Ramp Comments:   CURB:  Level of Assistance:  NT Assistive device utilized: None Curb Comments:   STAIRS:  Level of Assistance: SBA  Stair Negotiation Technique: Alternating Pattern  with Single Rail on Right  Number of Stairs: 14   Height of Stairs: 7   FUNCTIONAL TESTS:  5 times sit to stand: 16.23 sec; >16 sec indicates fall risk with PD, >15 sec indicates risk for recurrent falls with geriatric population Timed up and go (TUG): 10.78 sec (Normal), 10.59 sec (Manual), 12.25 sec (Cognitive) 10 meter walk test: 10.34 sec (pt noting DOE), Gait speed: 3.17 ft/sec  Berg Balance Scale: 51/56; 46-51 moderate fall risk (>50%)  Functional gait assessment: 19/30; 19-24 = medium risk fall  Baselines as of D/C from last PT episode (02/2022): 5 times sit to stand: 12.53 sec Timed up and go (TUG): 8.88 sec (Normal) 10 meter walk test: 8.82 sec; Gait speed: 3.72 ft/sec  Berg Balance Scale: 52/56 Functional gait assessment: 26/30  PATIENT SURVEYS:  ABC scale 1470 / 1600 = 91.9 %   TODAY'S TREATMENT:  09/27/22 Resting VS:   BP: 115/60   HR: 86 bpm   SpO2: 98%  THERAPEUTIC EXERCISE: to improve flexibility, strength and mobility.  Verbal and tactile cues throughout for technique. NuStep L6 x 6 min (B LE & R UE) - VS after warm-up: HR: 108-115 bpm, SpO2: 99% 6" fwd step-ups 2 x 10 - 1 set each leading with R & L foot 6" lateral step-ups x 10 bil - HR:121 bpm 6" fwd step-over alt R & L lead foot x 10 - HR:124 bpm  NEUROMUSCULAR RE-EDUCATION: To improve balance, proprioception, coordination, reduce fall risk, amplitude of movement, speed of movement to reduce bradykinesia, and reduce rigidity. PWR! Step through fwd & back x 10 each leg, single UE support on counter PWR! Up +  fwd step & back step isolating directions w/o UE support x 10 PWR! Up + step-through w/o UE support x 10   09/23/22 THERAPEUTIC EXERCISE: to improve flexibility, strength and mobility.  Verbal and tactile cues throughout for technique. NuStep L5 x 6 min (B LE & R UE)  HR (after warm up): 105 Seated hip ADD isometric ball squeeze with 5 sec hold x10- cues to hold for complete 5 secs Seated hip ADD isometric ball squeeze + LAQ 2x10 Standing alt hip ABD with RTB BLE x10 each side Standing alt hip extension with RTB BLE x10 each side  HR (after standing hip extension) -118 bpm Standing alt hip flexion with RTB BLE x10 each side  HR (after standing hip flexion) -117 bpm  NEUROMUSCULAR RE-EDUCATION: To improve posture, balance, reduce fall risk, and amplitude of movement. Seated PWR Moves- Up, Rock, Step x10 each side- unable to tolerate PWR twist due to L shoulder pain, PWR rock modified to not use L UE Standing PWR Moves-Up STS, Rock, Step x10 each side- modified PWR rock and step without using L UE due to pain  HR -122 After PWR Up   09/20/22 THERAPEUTIC EXERCISE: to improve flexibility, strength and mobility.  Verbal and tactile cues throughout for technique. NuStep - L6 x 6 min (B LE & R UE)  VS (after warm-up): BP 114/66 HR: 94 SpO2: 97% Seated RTB clam 10 x 3", 2 sets Seated RTB march 10 x 3", 2 sets Sit <> stand + RTB hip ABD isometric x 10 Seated hip ADD isometric ball squeeze 10 x 5", 2 sets Standing alt hip  ABD with looped RTB at ankles 10 x 3" Standing alt hip extension with looped RTB at ankles 10 x 3" Standing alt hip flexion with looped RTB at ankles 10 x 3"   09/16/22 THERAPEUTIC EXERCISE: to improve flexibility, strength and mobility.  Verbal and tactile cues throughout for technique. NuStep - L6 x 6 min (B LE & R UE)  NEUROMUSCULAR RE-EDUCATION: To improve posture, balance, coordination, amplitude of movement, speed of movement to reduce bradykinesia, and reduce  rigidity. Seated PWR! Moves (Up, Reach, Twist & Step) x 10 each - PWR! Reach modified to just L LE extension avoiding :L UE reach to avoid shoulder irritation, some increased neck pain noted during PWR! Step Standing PWR Moves (Up, Reach, Twist & Step) x 10 each - PWR! Reach modified to only raise R UE reach to avoid L shoulder irritation PWR! Walk with exaggerated reciprocal arm swing 2 x 80 ft - pt struggling with coordination of reciprocal arm motion with stepping causing her to have to slow her pace with slight pause after each step; also limited by SOB - SpO2 98-100%, but HR increasing to 130's after only 1 rep   PATIENT EDUCATION: Education details: PWR! Moves - seated & standing Person educated: Patient Education method: Explanation, Demonstration, and Verbal cues Education comprehension: verbalized understanding and returned demonstration   HOME EXERCISE PROGRAM: Access Code: [email or web address] URL: [email or web address] Date: 09/20/2022 Prepared by: Annie Paras  Exercises - Seated Cervical Retraction  - 2-3 x daily - 7 x weekly - 2 sets - 10 reps - 3-5 sec hold - Sternocleidomastoid Stretch (Mirrored)  - 2-3 x daily - 7 x weekly - 3 reps - 30 sec hold - Seated Scalene Stretch with Towel (Mirrored)  - 2-3 x daily - 7 x weekly - 3 reps - 30 sec hold - Seated Hamstring Stretch  - 2 x daily - 7 x weekly - 3 reps - 30 sec hold - Seated Thoracic Extension AROM  - 2 x daily - 7 x weekly - 2 sets - 10 reps - 3 sec hold - Seated Hip Adduction Isometrics with Ball  - 1 x daily - 3-4 x weekly - 2 sets - 10 reps - 3-5 sec hold - Seated March with Resistance  - 1 x daily - 3-4 x weekly - 2 sets - 10 reps - 3 sec hold - Seated Hip Abduction with Resistance  - 1 x daily - 3-4 x weekly - 2 sets - 10 reps - 3 sec hold  Patient Education - Check for Safety   ASSESSMENT:  CLINICAL IMPRESSION: Sokha reports she was able to walk ~20 min (~1/3 mile) for exercise with good tolerance w/o  feeling winded this morning but after sitting around when she got home, her legs start to feel fatigued. Therapeutic activities focusing on functional strengthening and movements to improve balance and stability with multidirectional stepping. Pt frequently winded during exercises today with HR reaching 120's with some exercises, requiring frequent seated rest breaks, although O2 sats remaining between 97% & 100% - pt frustrated by activity limitations due to DOE. Luciann is nearing the end of her current POC and will need to complete POC as of end of cert date due to L TSA scheduled for 10/14/22, therefore will plan for review and update of HEP as needed in remaining visits.  OBJECTIVE IMPAIRMENTS: Abnormal gait, cardiopulmonary status limiting activity, decreased activity tolerance, decreased balance, decreased coordination, decreased endurance, decreased mobility, difficulty walking, decreased ROM, decreased strength, hypomobility, increased  fascial restrictions, impaired perceived functional ability, increased muscle spasms, impaired flexibility, improper body mechanics, postural dysfunction, and pain.   ACTIVITY LIMITATIONS: carrying, lifting, sleeping, bed mobility, reach over head, and locomotion level  PARTICIPATION LIMITATIONS: meal prep, cleaning, laundry, shopping, and community activity  PERSONAL FACTORS: Age, Past/current experiences, Time since onset of injury/illness/exacerbation, and 3+ comorbidities: R shoulder advanced OA - scheduled for reverse TSA on 10/14/22; R TKA 07/2019, B THR 2018 & 2017, posterior C1-2 cervical fusion 2018, rupture of flexor tendon of L hand with failed surgical repair 2020, Parkinson's, bladder cancer, HTN   are also affecting patient's functional outcome.   REHAB POTENTIAL: Good  CLINICAL DECISION MAKING: Evolving/moderate complexity  EVALUATION COMPLEXITY: Moderate   GOALS: Goals reviewed with patient? Yes  SHORT TERM GOALS: Target date: 09/15/2022    Patient will be independent with initial HEP. Baseline:  Goal status: MET  09/13/22  2.  Patient will be educated on strategies to decrease risk of falls.  Baseline:  Goal status: MET  09/13/22  LONG TERM GOALS: Target date: 10/06/2022   Patient will be independent with ongoing/advanced HEP for self-management at home incorporating PWR! Moves as indicated .  Baseline:  Goal status: IN PROGRESS  2.  Patient will be able to ambulate 600' with LRAD with good safety to access community.  Baseline:  Goal status: IN PROGRESS  3.  Patient will be able to step up/down curb safely with LRAD for safety with community ambulation.  Baseline:  Goal status: IN PROGRESS   4.  Patient to improve cervical AROM by >/= 25% with pain dereased by >/= 50% with movement to allow for increased participation in church services and improve safety with driving   Baseline:  Goal status: IN PROGRESS  5.  Patient will improve 5x STS time to </= 14.8 seconds to demonstrate improved functional strength and transfer efficiency per age appropriate norms. Baseline: 16.23 sec Goal status: IN PROGRESS  6.  Patient will demonstrate at least 24/30 on FGA to improve gait stability and reduce risk for falls. (MCID = 4 points) Baseline: 19/30 Goal status: IN PROGRESS  7.  Patient will improve Berg score to >/= 54/56 to improve safety and stability with ADLs in standing and reduce risk for falls.  Baseline: 51/56 Goal status: IN PROGRESS  9.  Patient will maintain or improve score on ABC scale to demonstrate improved balance confidence and decreased risk for falls. Baseline: 1470 / 1600 = 91.9% Goal status: IN PROGRESS  10. Patient will verbalize understanding of local Parkinson's disease community resources, including community fitness post d/c. Baseline:  Goal status: IN PROGRESS   PLAN: PT FREQUENCY: 2x/week  PT DURATION: 6 weeks  PLANNED INTERVENTIONS: Therapeutic exercises, Therapeutic activity,  Neuromuscular re-education, Balance training, Gait training, Patient/Family education, Self Care, Joint mobilization, Dry Needling, Electrical stimulation, Spinal mobilization, Cryotherapy, Moist heat, Taping, Ultrasound, Ionotophoresis 85m/ml Dexamethasone, Manual therapy, and Re-evaluation  PLAN FOR NEXT SESSION:  monitor VS at baseline and with exertion; HEP review/update as needed; continue to progress LE strengthening and PD related movements as neck pain better controlled and HR allows; gentle cervical ROM and stretching; MT +/- DN to address neck pain and abnormal muscle tension   JPercival Spanish PT 09/27/2022, 2:37 PM

## 2022-09-27 NOTE — Patient Instructions (Signed)
Visit Information  Thank you for taking time to visit with me today. Please don't hesitate to contact me if I can be of assistance to you before our next scheduled telephone appointment.  Following are the goals we discussed today:   Our next appointment is by telephone on 11/01/22 at 10:30 AM   Please call the care guide team at [phone] if you need to cancel or reschedule your appointment.   If you are experiencing a Mental Health or [place] or need someone to talk to, please go to [facility] [place] [phone])   Following is a copy of your full plan of care:   Interventions: Discussed program support with client Reviewed pain issues of client Reviewed family support. Client has support from her daughter Discussed client support with PCP Discussed client needs. She has sleeping issues.  Encouraged client to call LCSW as needed at [phone] to further discuss program support.  Ms. Alvira was given information about Care Management services by the embedded care coordination team including:  Care Management services include personalized support from designated clinical staff supervised by her physician, including individualized plan of care and coordination with other care providers 24/7 contact phone numbers for assistance for urgent and routine care needs. The patient may stop CCM services at any time (effective at the end of the month) by phone call to the office staff.  Patient agreed to services and verbal consent obtained.   Norva Riffle.Osama Coleson MSW, [place] Holiday representative [facility] Care Management [phone]

## 2022-09-27 NOTE — Patient Outreach (Signed)
  Care Coordination   Initial Visit Note   09/27/2022 Name: Stephanie [name] MRN: [id number] DOB: May 18, 1932  Stephanie [name] is a 88 y.o. year old female who sees Mosie Lukes, MD for primary care. I spoke with  Stephanie [name] by phone today.  What matters to the patients health and wellness today? Client said she has scheduled shoulder surgery on 10/14/22     Goals Addressed               This Visit's Progress     Patient Stated she  is scheduled for shoulder surgery on 10/14/22. (pt-stated)        Interventions: Discussed program support with client Reviewed pain issues of client Reviewed family support. Client has support from her daughter Discussed client support with PCP Discussed client needs. She has sleeping issues.  Encouraged client to call LCSW as needed at [phone] to further discuss program support.    SDOH assessments and interventions completed:  Yes  SDOH Interventions Today    Flowsheet Row Most Recent Value  SDOH Interventions   Depression Interventions/Treatment  Counseling, Medication  Physical Activity Interventions Other (Comments)  [client has some walking challenges]  Stress Interventions Provide Counseling  [stress related to managing health needs]       Care Coordination Interventions Activated:  Yes  Care Coordination Interventions:  Yes, provided   Follow up plan: Follow up call scheduled for 11/01/22 at 10:30 AM    Encounter Outcome:  Pt. Visit Completed   Norva Riffle.Rick Carruthers MSW, [place] Holiday representative [facility] Care Management [phone]

## 2022-09-28 ENCOUNTER — Other Ambulatory Visit (INDEPENDENT_AMBULATORY_CARE_PROVIDER_SITE_OTHER): Payer: Medicare Other

## 2022-09-28 ENCOUNTER — Ambulatory Visit (INDEPENDENT_AMBULATORY_CARE_PROVIDER_SITE_OTHER): Payer: Medicare Other | Admitting: Family Medicine

## 2022-09-28 VITALS — BP 117/67 | HR 91

## 2022-09-28 DIAGNOSIS — I10 Essential (primary) hypertension: Secondary | ICD-10-CM

## 2022-09-28 LAB — COMPREHENSIVE METABOLIC PANEL
ALT: 4 U/L (ref 0–35)
AST: 21 U/L (ref 0–37)
Albumin: 4.3 g/dL (ref 3.5–5.2)
Alkaline Phosphatase: 36 U/L — ABNORMAL LOW (ref 39–117)
BUN: 18 mg/dL (ref 6–23)
CO2: 31 mEq/L (ref 19–32)
Calcium: 8.9 mg/dL (ref 8.4–10.5)
Chloride: 97 mEq/L (ref 96–112)
Creatinine, Ser: 0.74 mg/dL (ref 0.40–1.20)
GFR: 72.09 mL/min (ref >60.00)
Glucose, Bld: 117 mg/dL — ABNORMAL HIGH (ref 70–99)
Potassium: 3.3 mEq/L — ABNORMAL LOW (ref 3.5–5.1)
Sodium: 134 mEq/L — ABNORMAL LOW (ref 135–145)
Total Bilirubin: 0.7 mg/dL (ref 0.2–1.2)
Total Protein: 5.9 g/dL — ABNORMAL LOW (ref 6.0–8.3)

## 2022-09-28 NOTE — Progress Notes (Unsigned)
Pt here for Blood pressure check per Dr. Charlett Blake.  Pt was dropped from 37.'5mg'$  of hctz daily to '25mg'$  daily  Pt currently takes: hctz '25mg'$  once a day and amlodipine '5mg'$  once a day  Pt has been checking at home.  09/09/22- 126/78  P: 79 09/13/22 - 123/81 P; 78 09/27/22- 115/60   Pt reports compliance with medication.  BP today @ = 117/67 HR = 91  Pt advised per Dr. Charlett Blake continue same regimen and we will recheck at next visit.

## 2022-09-28 NOTE — Addendum Note (Signed)
Addended by: Kelle Darting A on: 09/28/2022 01:59 PM   Modules accepted: Orders

## 2022-09-29 ENCOUNTER — Other Ambulatory Visit: Payer: Self-pay

## 2022-09-29 DIAGNOSIS — I10 Essential (primary) hypertension: Secondary | ICD-10-CM

## 2022-09-30 ENCOUNTER — Ambulatory Visit: Payer: Medicare Other | Admitting: Physical Therapy

## 2022-09-30 ENCOUNTER — Encounter: Payer: Self-pay | Admitting: Physical Therapy

## 2022-09-30 DIAGNOSIS — R2681 Unsteadiness on feet: Secondary | ICD-10-CM | POA: Diagnosis not present

## 2022-09-30 DIAGNOSIS — M6281 Muscle weakness (generalized): Secondary | ICD-10-CM | POA: Diagnosis not present

## 2022-09-30 DIAGNOSIS — M25512 Pain in left shoulder: Secondary | ICD-10-CM | POA: Diagnosis not present

## 2022-09-30 DIAGNOSIS — R2689 Other abnormalities of gait and mobility: Secondary | ICD-10-CM | POA: Diagnosis not present

## 2022-09-30 DIAGNOSIS — M542 Cervicalgia: Secondary | ICD-10-CM

## 2022-09-30 DIAGNOSIS — G20A1 Parkinson's disease without dyskinesia, without mention of fluctuations: Secondary | ICD-10-CM | POA: Diagnosis not present

## 2022-09-30 NOTE — Therapy (Signed)
OUTPATIENT PHYSICAL THERAPY TREATMENT   Patient Name: Stephanie [name] MRN: [id number] DOB:01-19-1932, 88 y.o., female Today's Date: 09/30/2022    PT End of Session - 09/30/22 1303     Visit Number 9    Date for PT Re-Evaluation 10/06/22    Authorization Type Medicare & BCBS    PT Start Time 1303    PT Stop Time 1346    PT Time Calculation (min) 43 min    Activity Tolerance Patient tolerated treatment well    Behavior During Therapy [facility] for tasks assessed/performed               Past Medical History:  Diagnosis Date   Allergy    Anemia    past hx    Arthropathy of cervical spine 11/27/2012   Right  Xray at [facility]  On 04/08/2016  AP, lateral, and lateral flexion and extension views of the cervical spine are submitted for evaluation.   No acute fracture identified in the cervical spine.   There is redemonstration of approximately 2 mm of C3 on C4 anterolisthesis in neutral which slightly increases in flexion relative to neutral and extension. Slight C5 on C6 retrolisthesis does not change i   Benign hypertension without congestive heart failure    Benign mole    right hcets mole, bleeds when dried with towel   Bilateral dry eyes    Bladder cancer ([place]) October 28, 2016   Cervical spondylosis    Cervicalgia    2 screws in place    Chronic kidney disease    kidney stones    Depression with anxiety 10/28/16   prior to husbands death    Gross hematuria    developed after first hip replacment, led to indicental finding of bladder tumor, bleeding now resolved    History of kidney stones    1970's   Left hand weakness    due to reinjury of flxor tendon s/p tendon surgery march 12-2018   Lumbar stenosis    Malignant tumor of urinary bladder ([place]) 07/2016   Pre-stage I   OA (osteoarthritis)    Parkinson disease dx 2012   neurologist-  dr siddiqui at [facility])     Dr Trudie Reed , [place] [facility]    Rupture of flexor tendon of hand 2020   right   Past  Surgical History:  Procedure Laterality Date   BIOPSY MASS LEFT FIRST METACARPAL   06/23/2006   benign   CATARACT EXTRACTION W/ INTRAOCULAR LENS  IMPLANT, BILATERAL  1999 and 2003   CERVICAL FUSION  02/2017   c1-c2 ; reports she has 2 screws 2in long in place done at [facility] ; patient exhibits VERY LIMITED NECK ROM   COLONOSCOPY     CYSTOSCOPY W/ RETROGRADES Bilateral 09/22/2016   Procedure: CYSTOSCOPY WITH RETROGRADE PYELOGRAM;  Surgeon: Alexis Frock, MD;  Location: [facility];  Service: Urology;  Laterality: Bilateral;   D & C HYSTEROSCOPY W/ RESECTION POLYP  07/11/2000   DILATION AND CURETTAGE OF UTERUS  1960's   EXCISION CYST AND DEBRIDEMENT RIGHT WIRST AND REMOVAL FORGEIGN BODY  01/09/2009   FLEXOR TENDON REPAIR Left 12/12/2018   Procedure: REPAIR/TRANSFER FLEXOR DIGITORUM PROFUNDUS OF LEFT SMALL FINGER;  Surgeon: Daryll Brod, MD;  Location: [facility];  Service: Orthopedics;  Laterality: Left;   LAPAROSCOPY  yrs ago   infertility    METACARPOPHALANGEAL JOINT ARTHRODESIS Right 05/25/1996   TENDON REPAIR Left 01/15/2019   Hand  TONSILLECTOMY AND ADENOIDECTOMY  child   TOTAL HIP ARTHROPLASTY Left 07/07/2016   Procedure: LEFT TOTAL HIP ARTHROPLASTY ANTERIOR APPROACH;  Surgeon: Gaynelle Arabian, MD;  Location: [facility];  Service: Orthopedics;  Laterality: Left;   TOTAL HIP ARTHROPLASTY Right 09/28/2017   Procedure: RIGHT TOTAL HIP ARTHROPLASTY ANTERIOR APPROACH;  Surgeon: Gaynelle Arabian, MD;  Location: [facility];  Service: Orthopedics;  Laterality: Right;   TOTAL KNEE ARTHROPLASTY Right 07/30/2019   Procedure: TOTAL KNEE ARTHROPLASTY;  Surgeon: Gaynelle Arabian, MD;  Location: [facility];  Service: Orthopedics;  Laterality: Right;  59mn   TRANSURETHRAL RESECTION OF BLADDER TUMOR N/A 09/22/2016   Procedure: TRANSURETHRAL RESECTION OF BLADDER TUMOR (TURBT);  Surgeon: TAlexis Frock MD;  [facility]  Service: Urology;  Laterality: N/A;    Patient Active Problem List   Diagnosis Date Noted   Dyspnea 08/02/2022   Insomnia 08/02/2022   Hyponatremia 08/01/2022   Shoulder pain, left 09/14/2021   History of COVID-19 01/06/2021   Diarrhea 09/03/2020   Vitamin D deficiency 09/03/2020   OA (osteoarthritis) of knee 07/30/2019   Tinea corporis 06/08/2019   [place] 04/02/2019   Anemia 03/13/2018   Hot flashes 03/13/2018   S/P cervical spinal fusion 03/31/2017   Neck pain 12/12/2016   Depression with anxiety 10/17/2016   [place] 10/17/2016   OA (osteoarthritis) of hip 07/07/2016   Spinal stenosis of lumbar region with radiculopathy 04/08/2016   Spondylolisthesis of lumbar region 04/08/2016   Spondylosis of cervical region without myelopathy or radiculopathy 04/08/2016   Preventative health care 01/18/2016   Low back pain 01/18/2016   History of chicken pox    Hyperlipidemia 12/15/2014   Allergic rhinitis 06/25/2014   Allergy to bee sting 06/10/2014   TMJ tenderness 11/16/2013   Other malaise and fatigue 05/07/2013   [place] 11/27/2012   Muscle spasms of neck 11/27/2012   Parkinson's disease 05/24/2012   Conjunctivochalasis 03/09/2012   Epiretinal membrane 03/09/2012   Hyperopia with astigmatism and presbyopia 03/09/2012   Pseudophakia 03/09/2012   HTN (hypertension) 08/31/2011   Right knee pain 08/31/2011   Benign hypertensive heart disease without heart failure 05/12/2011    PCP: BMosie Lukes MD  REFERRING PROVIDER: SEldridge Abrahams MD   REFERRING DIAG: G20.A1 (ICD-10-CM) - Parkinson's disease without dyskinesia, without mention of fluctuations  THERAPY DIAG:  Other abnormalities of gait and mobility  Unsteadiness on feet  Muscle weakness (generalized)  Cervicalgia  RATIONALE FOR EVALUATION AND TREATMENT: Rehabilitation  ONSET DATE: Diagnosed with PD in ~2012  NEXT MD VISIT: 6 months with PA   SUBJECTIVE:      SUBJECTIVE  STATEMENT: Pt states increased pain in her L shoulder blade and neck; causes her to not sleep well. She was able to walk this morning and complete her neck exercises. States pulmonologist called and scheduled appointment for 10/13/22.    PAIN:  Are you having pain? Yes: NPRS scale: up to 5/10 Pain location: Lshouder blade and neck Pain description: sore Aggravating factors: movement Relieving factors: rest  PERTINENT HISTORY:  L shoulder advanced OA - scheduled for reverse TSA on 10/14/22; R TKA 07/2019, B THR 2018 & 2017, posterior C1-2 cervical fusion 2018, rupture of flexor tendon of L hand with failed surgical repair 2020, Parkinson's, bladder cancer, HTN   PRECAUTIONS: None  WEIGHT BEARING RESTRICTIONS No  FALLS: Has patient fallen in last 6 months? No  LIVING ENVIRONMENT: Lives with: lives alone Lives in: House/apartment Stairs: Yes: External: 4  steps; on right going up, on left going up, and can reach both to garage Has following equipment at home: Single point cane, Walker - 2 wheeled, shower chair, and Grab bars  OCCUPATION: Retired  PLOF: Independent and Leisure: daily stretching/exercises ~20 min/day; walk 1/4 mile daily; active in church - bible study    PATIENT GOALS: "To be be able to walk safely after my shoulder surgery."   OBJECTIVE:   DIAGNOSTIC FINDINGS:  N/A  COGNITION: Overall cognitive status: Within functional limits for tasks assessed   SENSATION: WFL  COORDINATION: WFL other than limitations in motion due to joint stiffness  PALPATION: Increased muscle tension and TTP in L>R SCM, cervical paraspinals, scalenes, UT and LS   CERVICAL ROM:    Active ROM AROM  09/02/22  Flexion 34  Extension 35  Right lateral flexion 21  Left lateral flexion 20  Right rotation 30  Left rotation 29   (Blank rows = not tested)  UPPER EXTREMITY ROM:   Active ROM Right 09/02/22 Left 09/02/22  Shoulder flexion 168 53  Shoulder extension    Shoulder  abduction 179 51  Shoulder adduction    Shoulder internal rotation    Shoulder external rotation 69 39  Elbow flexion    Elbow extension    Wrist flexion    Wrist extension    Wrist ulnar deviation    Wrist radial deviation    Wrist pronation    Wrist supination     (Blank rows = not tested)  UPPER EXTREMITY MMT:   MMT Right 09/02/22 Left NT d/t ROM restrictions  Shoulder flexion 4+   Shoulder extension 4+   Shoulder abduction 4+   Shoulder adduction    Shoulder internal rotation 5   Shoulder external rotation 4+   Middle trapezius    Lower trapezius    Elbow flexion    Elbow extension    Wrist flexion    Wrist extension    Wrist ulnar deviation    Wrist radial deviation    Wrist pronation    Wrist supination    Grip strength     (Blank rows = not tested)  CERVICAL SPECIAL TESTS:  Spurling's test: Negative and Distraction test: Negative  MUSCLE LENGTH: Hamstrings: mod tight B ITB: mild/mod tight R>L Piriformis: mod tight B Hip flexors: mild/mod tight B Quads: mild/mod tight B Heelcord: WFL  POSTURE:  rounded shoulders, forward head, decreased thoracic kyphosis, weight shift right, and scoliosis with R lateral shift of head and shoulders  LOWER EXTREMITY ROM:    Limited B hip ER but otherwise WFL  LOWER EXTREMITY MMT:    MMT Right Eval Left Eval  Hip flexion 4 4  Hip extension 4 4  Hip abduction 4 4  Hip adduction 4 4  Hip internal rotation 4+ 4+  Hip external rotation 4+ 4+  Knee flexion 5 5  Knee extension 5 5  Ankle dorsiflexion 4+ 4+  Ankle plantarflexion 5 5  Ankle inversion    Ankle eversion    (Blank rows = not tested)  BED MOBILITY:  Sit to supine Complete Independence Supine to sit Complete Independence Rolling to Right Complete Independence Rolling to Left Complete Independence  TRANSFERS: Assistive device utilized: None  Sit to stand: Complete Independence Stand to sit: Complete Independence Chair to chair: Complete  Independence Floor:  NT  GAIT: Gait pattern: decreased arm swing- Right, decreased arm swing- Left, lateral lean- Right, and decreased trunk rotation Distance walked: 120 Assistive device utilized: None  Level of assistance: Complete Independence Comments: Gait speed = 3.17 ft/sec  RAMP: Level of Assistance:  NT Assistive device utilized: None Ramp Comments:   CURB:  Level of Assistance:  NT Assistive device utilized: None Curb Comments:   STAIRS:  Level of Assistance: SBA  Stair Negotiation Technique: Alternating Pattern  with Single Rail on Right  Number of Stairs: 14   Height of Stairs: 7   FUNCTIONAL TESTS:  5 times sit to stand: 16.23 sec; >16 sec indicates fall risk with PD, >15 sec indicates risk for recurrent falls with geriatric population Timed up and go (TUG): 10.78 sec (Normal), 10.59 sec (Manual), 12.25 sec (Cognitive) 10 meter walk test: 10.34 sec (pt noting DOE), Gait speed: 3.17 ft/sec  Berg Balance Scale: 51/56; 46-51 moderate fall risk (>50%)  Functional gait assessment: 19/30; 19-24 = medium risk fall  Baselines as of D/C from last PT episode (02/2022): 5 times sit to stand: 12.53 sec Timed up and go (TUG): 8.88 sec (Normal) 10 meter walk test: 8.82 sec; Gait speed: 3.72 ft/sec  Berg Balance Scale: 52/56 Functional gait assessment: 26/30  PATIENT SURVEYS:  ABC scale 1470 / 1600 = 91.9 %   TODAY'S TREATMENT: 09/30/22 Resting HR: 95 bpm  THERAPEUTIC EXERCISE: to improve flexibility, strength and mobility.  Verbal and tactile cues throughout for technique. NuStep L5 x 6 min- LE only, HR after warmup: 105 bpm Forward Step Ups 6" With Runners High Knee R/L 1x10- Lateral Steps 6" R/L 1x10- slight increase in shoulder blade pain but not intolerable- HR after 122bpm Side Stepping Hip Abduction at counter with RTB 3x10 steps R/L- cues to keep tension on the band throughout  NEUROMUSCULAR RE-EDUCATION: To improve posture, balance, coordination, reduce fall  risk, and amplitude of movement. PWR! Moves Step Through Fwd + Back R/L1x3- deferred due to increased L shoulder pain  MANUAL THERAPY: To promote normalized muscle tension and reduced pain.  TrP release and STM of L rhomboids   09/27/22 Resting VS:   BP: 115/60   HR: 86 bpm   SpO2: 98%  THERAPEUTIC EXERCISE: to improve flexibility, strength and mobility.  Verbal and tactile cues throughout for technique. NuStep L6 x 6 min (B LE & R UE) - VS after warm-up: HR: 108-115 bpm, SpO2: 99% 6" fwd step-ups 2 x 10 - 1 set each leading with R & L foot 6" lateral step-ups x 10 bil - HR:121 bpm 6" fwd step-over alt R & L lead foot x 10 - HR:124 bpm  NEUROMUSCULAR RE-EDUCATION: To improve balance, proprioception, coordination, reduce fall risk, amplitude of movement, speed of movement to reduce bradykinesia, and reduce rigidity. PWR! Step through fwd & back x 10 each leg, single UE support on counter PWR! Up + fwd step & back step isolating directions w/o UE support x 10 PWR! Up + step-through w/o UE support x 10   09/23/22 THERAPEUTIC EXERCISE: to improve flexibility, strength and mobility.  Verbal and tactile cues throughout for technique. NuStep L5 x 6 min (B LE & R UE)  HR (after warm up): 105 Seated hip ADD isometric ball squeeze with 5 sec hold x10- cues to hold for complete 5 secs Seated hip ADD isometric ball squeeze + LAQ 2x10 Standing alt hip ABD with RTB BLE x10 each side Standing alt hip extension with RTB BLE x10 each side  HR (after standing hip extension) -118 bpm Standing alt hip flexion with RTB BLE x10 each side  HR (after standing hip flexion) -117 bpm  NEUROMUSCULAR RE-EDUCATION: To improve posture, balance, reduce fall risk, and amplitude of movement. Seated PWR Moves- Up, Rock, Step x10 each side- unable to tolerate PWR twist due to L shoulder pain, PWR rock modified to not use L UE Standing PWR Moves-Up STS, Rock, Step x10 each side- modified PWR rock and step without  using L UE due to pain  HR -122 After PWR Up     PATIENT EDUCATION: Education details:  continue use of tennis ball massage at home PRN Person educated: Patient Education method: Explanation, Demonstration, and Verbal cues Education comprehension: verbalized understanding and returned demonstration   HOME EXERCISE PROGRAM: Access Code: [email or web address] URL: [email or web address] Date: 09/20/2022 Prepared by: Annie Paras  Exercises - Seated Cervical Retraction  - 2-3 x daily - 7 x weekly - 2 sets - 10 reps - 3-5 sec hold - Sternocleidomastoid Stretch (Mirrored)  - 2-3 x daily - 7 x weekly - 3 reps - 30 sec hold - Seated Scalene Stretch with Towel (Mirrored)  - 2-3 x daily - 7 x weekly - 3 reps - 30 sec hold - Seated Hamstring Stretch  - 2 x daily - 7 x weekly - 3 reps - 30 sec hold - Seated Thoracic Extension AROM  - 2 x daily - 7 x weekly - 2 sets - 10 reps - 3 sec hold - Seated Hip Adduction Isometrics with Ball  - 1 x daily - 3-4 x weekly - 2 sets - 10 reps - 3-5 sec hold - Seated March with Resistance  - 1 x daily - 3-4 x weekly - 2 sets - 10 reps - 3 sec hold - Seated Hip Abduction with Resistance  - 1 x daily - 3-4 x weekly - 2 sets - 10 reps - 3 sec hold  Patient Education - Check for Safety   ASSESSMENT:  CLINICAL IMPRESSION: Shaquandra reports that she has increased L shoulder and neck pain after not sleeping well last night. She attempted tennis ball self massage and TrP release at home. Todays session included manual therapy to reduce tension in L rhomboid and TE for LE strengthening. She responded well to manual therapy. Attempted NMR to address balance impairments but deferred due to increased L shoulder pain. [place] is nearing the end of current POC due to L TSA scheduled for 10/14/22, plan to review HEP and goal assessment with remaining visits.   OBJECTIVE IMPAIRMENTS: Abnormal gait, cardiopulmonary status limiting activity, decreased activity tolerance,  decreased balance, decreased coordination, decreased endurance, decreased mobility, difficulty walking, decreased ROM, decreased strength, hypomobility, increased fascial restrictions, impaired perceived functional ability, increased muscle spasms, impaired flexibility, improper body mechanics, postural dysfunction, and pain.   ACTIVITY LIMITATIONS: carrying, lifting, sleeping, bed mobility, reach over head, and locomotion level  PARTICIPATION LIMITATIONS: meal prep, cleaning, laundry, shopping, and community activity  PERSONAL FACTORS: Age, Past/current experiences, Time since onset of injury/illness/exacerbation, and 3+ comorbidities: R shoulder advanced OA - scheduled for reverse TSA on 10/14/22; R TKA 07/2019, B THR 2018 & 2017, posterior C1-2 cervical fusion 2018, rupture of flexor tendon of L hand with failed surgical repair 2020, Parkinson's, bladder cancer, HTN   are also affecting patient's functional outcome.   REHAB POTENTIAL: Good  CLINICAL DECISION MAKING: Evolving/moderate complexity  EVALUATION COMPLEXITY: Moderate   GOALS: Goals reviewed with patient? Yes  SHORT TERM GOALS: Target date: 09/15/2022   Patient will be independent with initial HEP. Baseline:  Goal status: MET  09/13/22  2.  Patient will be educated on strategies to  decrease risk of falls.  Baseline:  Goal status: MET  09/13/22  LONG TERM GOALS: Target date: 10/06/2022   Patient will be independent with ongoing/advanced HEP for self-management at home incorporating PWR! Moves as indicated .  Baseline:  Goal status: IN PROGRESS  2.  Patient will be able to ambulate 600' with LRAD with good safety to access community.  Baseline:  Goal status: IN PROGRESS  3.  Patient will be able to step up/down curb safely with LRAD for safety with community ambulation.  Baseline:  Goal status: IN PROGRESS   4.  Patient to improve cervical AROM by >/= 25% with pain dereased by >/= 50% with movement to allow for  increased participation in church services and improve safety with driving   Baseline:  Goal status: IN PROGRESS  5.  Patient will improve 5x STS time to </= 14.8 seconds to demonstrate improved functional strength and transfer efficiency per age appropriate norms. Baseline: 16.23 sec Goal status: IN PROGRESS  6.  Patient will demonstrate at least 24/30 on FGA to improve gait stability and reduce risk for falls. (MCID = 4 points) Baseline: 19/30 Goal status: IN PROGRESS  7.  Patient will improve Berg score to >/= 54/56 to improve safety and stability with ADLs in standing and reduce risk for falls.  Baseline: 51/56 Goal status: IN PROGRESS  9.  Patient will maintain or improve score on ABC scale to demonstrate improved balance confidence and decreased risk for falls. Baseline: 1470 / 1600 = 91.9% Goal status: IN PROGRESS  10. Patient will verbalize understanding of local Parkinson's disease community resources, including community fitness post d/c. Baseline:  Goal status: IN PROGRESS   PLAN: PT FREQUENCY: 2x/week  PT DURATION: 6 weeks  PLANNED INTERVENTIONS: Therapeutic exercises, Therapeutic activity, Neuromuscular re-education, Balance training, Gait training, Patient/Family education, Self Care, Joint mobilization, Dry Needling, Electrical stimulation, Spinal mobilization, Cryotherapy, Moist heat, Taping, Ultrasound, Ionotophoresis 52m/ml Dexamethasone, Manual therapy, and Re-evaluation  PLAN FOR NEXT SESSION:  10th PN; goal assessment, review/consolidate HEP, monitor VS at baseline and with exertion; progress LE strengthening and PD related movements as neck pain better controlled and HR allows; gentle cervical ROM and stretching; MT +/- DN to address neck pain and abnormal muscle tension   OZeb Comfort Student-PT 09/30/2022, 5:45 PM

## 2022-09-30 NOTE — Patient Instructions (Signed)
DUE TO COVID-19 ONLY TWO VISITORS  (aged 86 and older)  ARE ALLOWED TO COME WITH YOU AND STAY IN THE WAITING ROOM ONLY DURING PRE OP AND PROCEDURE.   **NO VISITORS ARE ALLOWED IN THE SHORT STAY AREA OR RECOVERY ROOM!!**  IF YOU WILL BE ADMITTED INTO THE HOSPITAL YOU ARE ALLOWED ONLY FOUR SUPPORT PEOPLE DURING VISITATION HOURS ONLY (7 AM -8PM)   The support person(s) must pass our screening, gel in and out, and wear a mask at all times, including in the patient's room. Patients must also wear a mask when staff or their support person are in the room. Visitors GUEST BADGE MUST BE WORN VISIBLY  One adult visitor may remain with you overnight and MUST be in the room by 8 P.M.     Your procedure is scheduled on: 10/14/22   Report to [facility] Main Entrance    Report to admitting at : 7:30 AM   Call this number if you have problems the morning of surgery [phone]   Do not eat food :After Midnight.   After Midnight you may have the following liquids until ______ AM/ PM DAY OF SURGERY  Water Black Coffee (sugar ok, NO MILK/CREAM OR CREAMERS)  Tea (sugar ok, NO MILK/CREAM OR CREAMERS) regular and decaf                             Plain Jell-O (NO RED)                                           Fruit ices (not with fruit pulp, NO RED)                                     Popsicles (NO RED)                                                                  Juice: apple, WHITE grape, WHITE cranberry Sports drinks like Gatorade (NO RED)   The day of surgery:  Drink ONE (1) Pre-Surgery Clear Ensure or G2 at: 7:00 AM the morning of surgery. Drink in one sitting. Do not sip.  This drink was given to you during your hospital  pre-op appointment visit. Nothing else to drink after completing the  Pre-Surgery Clear Ensure or G2.          If you have questions, please contact your surgeon's office.    Oral Hygiene is also important to reduce your risk of infection.                                     Remember - BRUSH YOUR TEETH THE MORNING OF SURGERY WITH YOUR REGULAR TOOTHPASTE   Do NOT smoke after Midnight   Take these medicines the morning of surgery with A SIP OF WATER: CARBIDOPA,ESTRADIOL,PANTOPRAZOLE.  You may not have any metal on your body including hair pins, jewelry, and body piercing             Do not wear make-up, lotions, powders, perfumes/cologne, or deodorant  Do not wear nail polish including gel and S&S, artificial/acrylic nails, or any other type of covering on natural nails including finger and toenails. If you have artificial nails, gel coating, etc. that needs to be removed by a nail salon please have this removed prior to surgery or surgery may need to be canceled/ delayed if the surgeon/ anesthesia feels like they are unable to be safely monitored.   Do not shave  48 hours prior to surgery.    Do not bring valuables to the hospital. [place].   Contacts, dentures or bridgework may not be worn into surgery.   Bring small overnight bag day of surgery.   DO NOT [place]. PHARMACY WILL DISPENSE MEDICATIONS LISTED ON YOUR MEDICATION LIST TO YOU DURING YOUR ADMISSION [place]!    Patients discharged on the day of surgery will not be allowed to drive home.  Someone NEEDS to stay with you for the first 24 hours after anesthesia.   Special Instructions: Bring a copy of your healthcare power of attorney and living will documents         the day of surgery if you haven't scanned them before.              Please read over the following fact sheets you were given: IF YOU HAVE QUESTIONS ABOUT YOUR PRE-OP INSTRUCTIONS PLEASE CALL [phone]    [facility] - Preparing for Surgery Before surgery, you can play an important role.  Because skin is not sterile, your skin needs to be as free of germs as possible.  You can reduce the number of  germs on your skin by washing with CHG (chlorahexidine gluconate) soap before surgery.  CHG is an antiseptic cleaner which kills germs and bonds with the skin to continue killing germs even after washing. Please DO NOT use if you have an allergy to CHG or antibacterial soaps.  If your skin becomes reddened/irritated stop using the CHG and inform your nurse when you arrive at Short Stay. Do not shave (including legs and underarms) for at least 48 hours prior to the first CHG shower.  You may shave your face/neck. Please follow these instructions carefully:  1.  Shower with CHG Soap the night before surgery and the  morning of Surgery.  2.  If you choose to wash your hair, wash your hair first as usual with your  normal  shampoo.  3.  After you shampoo, rinse your hair and body thoroughly to remove the  shampoo.                           4.  Use CHG as you would any other liquid soap.  You can apply chg directly  to the skin and wash                       Gently with a scrungie or clean washcloth.  5.  Apply the CHG Soap to your body ONLY FROM THE NECK DOWN.   Do not use on face/ open  Wound or open sores. Avoid contact with eyes, ears mouth and genitals (private parts).                       Wash face,  Genitals (private parts) with your normal soap.             6.  Wash thoroughly, paying special attention to the area where your surgery  will be performed.  7.  Thoroughly rinse your body with warm water from the neck down.  8.  DO NOT shower/wash with your normal soap after using and rinsing off  the CHG Soap.                9.  Pat yourself dry with a clean towel.            10.  Wear clean pajamas.            11.  Place clean sheets on your bed the night of your first shower and do not  sleep with pets. Day of Surgery : Do not apply any lotions/deodorants the morning of surgery.  Please wear clean clothes to the hospital/surgery center.  FAILURE TO FOLLOW THESE  INSTRUCTIONS MAY RESULT IN THE CANCELLATION OF YOUR SURGERY PATIENT SIGNATURE_________________________________  NURSE SIGNATURE__________________________________  ________________________________________________________________________[facility]- Preparing for Total Shoulder Arthroplasty    Before surgery, you can play an important role. Because skin is not sterile, your skin needs to be as free of germs as possible. You can reduce the number of germs on your skin by using the following products. Benzoyl Peroxide Gel Reduces the number of germs present on the skin Applied twice a day to shoulder area starting two days before surgery    ==================================================================  Please follow these instructions carefully:  BENZOYL PEROXIDE 5% GEL  Please do not use if you have an allergy to benzoyl peroxide.   If your skin becomes reddened/irritated stop using the benzoyl peroxide.  Starting two days before surgery, apply as follows: Apply benzoyl peroxide in the morning and at night. Apply after taking a shower. If you are not taking a shower clean entire shoulder front, back, and side along with the armpit with a clean wet washcloth.  Place a quarter-sized dollop on your shoulder and rub in thoroughly, making sure to cover the front, back, and side of your shoulder, along with the armpit.   2 days before ____ AM   ____ PM              1 day before ____ AM   ____ PM                         Do this twice a day for two days.  (Last application is the night before surgery, AFTER using the CHG soap as described below).  Do NOT apply benzoyl peroxide gel on the day of surgery.  Incentive Spirometer  An incentive spirometer is a tool that can help keep your lungs clear and active. This tool measures how well you are filling your lungs with each breath. Taking long deep breaths may help reverse or decrease the chance of developing breathing (pulmonary) problems  (especially infection) following: A long period of time when you are unable to move or be active. BEFORE THE PROCEDURE  If the spirometer includes an indicator to show your best effort, your nurse or respiratory therapist will set it to a desired goal. If possible, sit up  straight or lean slightly forward. Try not to slouch. Hold the incentive spirometer in an upright position. INSTRUCTIONS FOR USE  Sit on the edge of your bed if possible, or sit up as far as you can in bed or on a chair. Hold the incentive spirometer in an upright position. Breathe out normally. Place the mouthpiece in your mouth and seal your lips tightly around it. Breathe in slowly and as deeply as possible, raising the piston or the ball toward the top of the column. Hold your breath for 3-5 seconds or for as long as possible. Allow the piston or ball to fall to the bottom of the column. Remove the mouthpiece from your mouth and breathe out normally. Rest for a few seconds and repeat Steps 1 through 7 at least 10 times every 1-2 hours when you are awake. Take your time and take a few normal breaths between deep breaths. The spirometer may include an indicator to show your best effort. Use the indicator as a goal to work toward during each repetition. After each set of 10 deep breaths, practice coughing to be sure your lungs are clear. If you have an incision (the cut made at the time of surgery), support your incision when coughing by placing a pillow or rolled up towels firmly against it. Once you are able to get out of bed, walk around indoors and cough well. You may stop using the incentive spirometer when instructed by your caregiver.  RISKS AND COMPLICATIONS Take your time so you do not get dizzy or light-headed. If you are in pain, you may need to take or ask for pain medication before doing incentive spirometry. It is harder to take a deep breath if you are having pain. AFTER USE Rest and breathe slowly and  easily. It can be helpful to keep track of a log of your progress. Your caregiver can provide you with a simple table to help with this. If you are using the spirometer at home, follow these instructions: [place] IF:  You are having difficultly using the spirometer. You have trouble using the spirometer as often as instructed. Your pain medication is not giving enough relief while using the spirometer. You develop fever of 100.5 F (38.1 C) or higher. SEEK IMMEDIATE MEDICAL CARE IF:  You cough up bloody sputum that had not been present before. You develop fever of 102 F (38.9 C) or greater. You develop worsening pain at or near the incision site. MAKE SURE YOU:  Understand these instructions. Will watch your condition. Will get help right away if you are not doing well or get worse. Document Released: 03/14/2007 Document Revised: 01/24/2012 Document Reviewed: 05/15/2007 [employer] Patient Information 2014 [place], [place].   ________________________________________________________________________

## 2022-10-01 ENCOUNTER — Other Ambulatory Visit: Payer: Self-pay

## 2022-10-01 ENCOUNTER — Encounter (HOSPITAL_COMMUNITY): Payer: Self-pay

## 2022-10-01 ENCOUNTER — Encounter (HOSPITAL_COMMUNITY)
Admission: RE | Admit: 2022-10-01 | Discharge: 2022-10-01 | Disposition: A | Discharge status: Home / Self Care | Payer: Medicare Other | Source: Ambulatory Visit | Attending: Orthopedic Surgery | Admitting: Orthopedic Surgery

## 2022-10-01 VITALS — BP 137/75 | HR 87 | Temp 98.2°F | Ht 62.0 in | Wt 116.0 lb

## 2022-10-01 DIAGNOSIS — I10 Essential (primary) hypertension: Secondary | ICD-10-CM | POA: Insufficient documentation

## 2022-10-01 DIAGNOSIS — Z01818 Encounter for other preprocedural examination: Secondary | ICD-10-CM | POA: Diagnosis not present

## 2022-10-01 HISTORY — DX: Dyspnea, unspecified: R06.00

## 2022-10-01 LAB — CBC
HCT: 39.6 % (ref 36.0–46.0)
Hemoglobin: 13.2 g/dL (ref 12.0–15.0)
MCH: 31 pg (ref 26.0–34.0)
MCHC: 33.3 g/dL (ref 30.0–36.0)
MCV: 93 fL (ref 80.0–100.0)
Platelets: 202 10*3/uL (ref 150–400)
RBC: 4.26 MIL/uL (ref 3.87–5.11)
RDW: 12.3 % (ref 11.5–15.5)
WBC: 7.2 10*3/uL (ref 4.0–10.5)
nRBC: 0 % (ref 0.0–0.2)

## 2022-10-01 LAB — SURGICAL PCR SCREEN
MRSA, PCR: NEGATIVE
Staphylococcus aureus: NEGATIVE

## 2022-10-01 NOTE — Progress Notes (Signed)
For Short Stay: [place] appointment date:  Bowel Prep reminder:   For Anesthesia: PCP - Dr. Penni Homans.: 09/28/22 Cardiologist - Dr. Fransico Him  Chest x-ray - 08/25/22 EKG - 08/02/22 Stress Test -  ECHO - 08/11/22 Cardiac Cath -  Pacemaker/ICD device last checked: Pacemaker orders received: Device Rep notified:  Spinal Cord Stimulator:  Sleep Study -  CPAP -   Fasting Blood Sugar -  Checks Blood Sugar _____ times a day Date and result of last Hgb A1c-  Last dose of GLP1 agonist-  GLP1 instructions:   Last dose of SGLT-2 inhibitors-  SGLT-2 instructions:   Blood Thinner Instructions: Aspirin Instructions: Last Dose:  Activity level: Can go up a flight of stairs and activities of daily living without stopping and without chest pain and/or shortness of breath   Able to exercise without chest pain and/or shortness of breath   Unable to go up a flight of stairs without chest pain and/or shortness of breath     Anesthesia review: Hx: HTN  Patient denies shortness of breath, fever, cough and chest pain at PAT appointment   Patient verbalized understanding of instructions that were given to them at the PAT appointment. Patient was also instructed that they will need to review over the PAT instructions again at home before surgery.

## 2022-10-04 ENCOUNTER — Ambulatory Visit: Payer: Medicare Other | Admitting: Physical Therapy

## 2022-10-04 ENCOUNTER — Encounter: Payer: Self-pay | Admitting: Physical Therapy

## 2022-10-04 DIAGNOSIS — M6281 Muscle weakness (generalized): Secondary | ICD-10-CM | POA: Diagnosis not present

## 2022-10-04 DIAGNOSIS — Z01810 Encounter for preprocedural cardiovascular examination: Secondary | ICD-10-CM | POA: Insufficient documentation

## 2022-10-04 DIAGNOSIS — M25512 Pain in left shoulder: Secondary | ICD-10-CM | POA: Diagnosis not present

## 2022-10-04 DIAGNOSIS — M542 Cervicalgia: Secondary | ICD-10-CM | POA: Diagnosis not present

## 2022-10-04 DIAGNOSIS — R2681 Unsteadiness on feet: Secondary | ICD-10-CM

## 2022-10-04 DIAGNOSIS — R2689 Other abnormalities of gait and mobility: Secondary | ICD-10-CM

## 2022-10-04 DIAGNOSIS — G20A1 Parkinson's disease without dyskinesia, without mention of fluctuations: Secondary | ICD-10-CM | POA: Diagnosis not present

## 2022-10-04 NOTE — Therapy (Addendum)
OUTPATIENT PHYSICAL THERAPY TREATMENT / PROGRESS NOTE / DISCHARGE SUMMARY   Patient Name: Stephanie [name] MRN: [id number] DOB:09/10/1932, 88 y.o., female Today's Date: 10/04/2022  Progress Note  Reporting Period 08/25/2022 to 10/04/2022  See note below for Objective Data and Assessment of Progress/Goals.      PT End of Session - 10/04/22 1316     Visit Number 10    Date for PT Re-Evaluation 10/06/22    Authorization Type Medicare & BCBS    PT Start Time 1316    PT Stop Time 1401    PT Time Calculation (min) 45 min    Activity Tolerance Patient tolerated treatment well    Behavior During Therapy WFL for tasks assessed/performed               Past Medical History:  Diagnosis Date   Allergy    Anemia    past hx    Arthropathy of cervical spine 11/27/2012   Right  Xray at [facility]  On 04/08/2016  AP, lateral, and lateral flexion and extension views of the cervical spine are submitted for evaluation.   No acute fracture identified in the cervical spine.   There is redemonstration of approximately 2 mm of C3 on C4 anterolisthesis in neutral which slightly increases in flexion relative to neutral and extension. Slight C5 on C6 retrolisthesis does not change i   Benign hypertension without congestive heart failure    Benign mole    right hcets mole, bleeds when dried with towel   Bilateral dry eyes    Bladder cancer (HCC) 2016-10-28   Cervical spondylosis    Cervicalgia    2 screws in place    Depression with anxiety 10/28/2016   prior to husbands death    Dyspnea    Gross hematuria    developed after first hip replacment, led to indicental finding of bladder tumor, bleeding now resolved    History of kidney stones    1970's   Left hand weakness    due to reinjury of flxor tendon s/p tendon surgery march 12-2018   Lumbar stenosis    Malignant tumor of urinary bladder (HCC) 07/2016   Pre-stage I   OA (osteoarthritis)    Parkinson disease dx 2012   neurologist-  dr  siddiqui at [facility]   Psoriatic arthritis [facility])     Dr Nickola Major , [place] [facility]    Rupture of flexor tendon of hand 2020   right   Past Surgical History:  Procedure Laterality Date   BIOPSY MASS LEFT FIRST METACARPAL   06/23/2006   benign   CATARACT EXTRACTION W/ INTRAOCULAR LENS  IMPLANT, BILATERAL  1999 and 2003   CERVICAL FUSION  02/2017   c1-c2 ; reports she has 2 screws 2in long in place done at [facility] ; patient exhibits VERY LIMITED NECK ROM   COLONOSCOPY     CYSTOSCOPY W/ RETROGRADES Bilateral 09/22/2016   Procedure: CYSTOSCOPY WITH RETROGRADE PYELOGRAM;  Surgeon: Sebastian Ache, MD;  Location: [facility];  Service: Urology;  Laterality: Bilateral;   D & C HYSTEROSCOPY W/ RESECTION POLYP  07/11/2000   DILATION AND CURETTAGE OF UTERUS  1960's   EXCISION CYST AND DEBRIDEMENT RIGHT WIRST AND REMOVAL FORGEIGN BODY  01/09/2009   FLEXOR TENDON REPAIR Left 12/12/2018   Procedure: REPAIR/TRANSFER FLEXOR DIGITORUM PROFUNDUS OF LEFT SMALL FINGER;  Surgeon: Cindee Salt, MD;  Location: [facility];  Service: Orthopedics;  Laterality: Left;   LAPAROSCOPY  yrs ago  infertility    METACARPOPHALANGEAL JOINT ARTHRODESIS Right 05/25/1996   TENDON REPAIR Left 01/15/2019   Hand   TONSILLECTOMY AND ADENOIDECTOMY  child   TOTAL HIP ARTHROPLASTY Left 07/07/2016   Procedure: LEFT TOTAL HIP ARTHROPLASTY ANTERIOR APPROACH;  Surgeon: Ollen Gross, MD;  Location: [facility];  Service: Orthopedics;  Laterality: Left;   TOTAL HIP ARTHROPLASTY Right 09/28/2017   Procedure: RIGHT TOTAL HIP ARTHROPLASTY ANTERIOR APPROACH;  Surgeon: Ollen Gross, MD;  Location: [facility];  Service: Orthopedics;  Laterality: Right;   TOTAL KNEE ARTHROPLASTY Right 07/30/2019   Procedure: TOTAL KNEE ARTHROPLASTY;  Surgeon: Ollen Gross, MD;  Location: [facility];  Service: Orthopedics;  Laterality: Right;    TRANSURETHRAL RESECTION OF BLADDER TUMOR N/A 09/22/2016   Procedure: TRANSURETHRAL  RESECTION OF BLADDER TUMOR (TURBT);  Surgeon: Sebastian Ache, MD;  Location: [facility];  Service: Urology;  Laterality: N/A;   Patient Active Problem List   Diagnosis Date Noted   Dyspnea 08/02/2022   Insomnia 08/02/2022   Hyponatremia 08/01/2022   Shoulder pain, left 09/14/2021   History of COVID-19 01/06/2021   Diarrhea 09/03/2020   Vitamin D deficiency 09/03/2020   OA (osteoarthritis) of knee 07/30/2019   Tinea corporis 06/08/2019   Psoriatic arthritis (HCC) 04/02/2019   Anemia 03/13/2018   Hot flashes 03/13/2018   S/P cervical spinal fusion 03/31/2017   Neck pain 12/12/2016   Depression with anxiety 10/17/2016   Bladder cancer (HCC) 10/17/2016   OA (osteoarthritis) of hip 07/07/2016   Spinal stenosis of lumbar region with radiculopathy 04/08/2016   Spondylolisthesis of lumbar region 04/08/2016   Spondylosis of cervical region without myelopathy or radiculopathy 04/08/2016   Preventative health care 01/18/2016   Low back pain 01/18/2016   History of chicken pox    Hyperlipidemia 12/15/2014   Allergic rhinitis 06/25/2014   Allergy to bee sting 06/10/2014   TMJ tenderness 11/16/2013   Other malaise and fatigue 05/07/2013   Arthropathy of cervical spine (HCC) 11/27/2012   Muscle spasms of neck 11/27/2012   Parkinson's disease 05/24/2012   Conjunctivochalasis 03/09/2012   Epiretinal membrane 03/09/2012   Hyperopia with astigmatism and presbyopia 03/09/2012   Pseudophakia 03/09/2012   HTN (hypertension) 08/31/2011   Right knee pain 08/31/2011   Benign hypertensive heart disease without heart failure 05/12/2011    PCP: Bradd Canary, MD  REFERRING PROVIDER: Jaquita Folds, MD   REFERRING DIAG: G20.A1 (ICD-10-CM) - Parkinson's disease without dyskinesia, without mention of fluctuations  THERAPY DIAG:  Other abnormalities of gait and mobility  Unsteadiness on feet  Muscle weakness (generalized)  Cervicalgia  RATIONALE FOR EVALUATION AND  TREATMENT: Rehabilitation  ONSET DATE: Diagnosed with PD in ~2012  NEXT MD VISIT: 6 months with PA   SUBJECTIVE:      SUBJECTIVE STATEMENT: Pt states she feels "worn out" today - thinks it's related to all of the medical appointments she has had lately.  PAIN:  Are you having pain? Yes: NPRS scale: up to 4/10 Pain location: L shoulder, scapula and neck Pain description: sore Aggravating factors: movement Relieving factors: rest  PERTINENT HISTORY:  L shoulder advanced OA - scheduled for reverse TSA on 10/14/22; R TKA 07/2019, B THR 2018 & 2017, posterior C1-2 cervical fusion 2018, rupture of flexor tendon of L hand with failed surgical repair 2020, Parkinson's, bladder cancer, HTN   PRECAUTIONS: None  WEIGHT BEARING RESTRICTIONS No  FALLS: Has patient fallen in last 6 months? No  LIVING ENVIRONMENT: Lives with: lives alone Lives in: House/apartment Stairs:  Yes: External: 4 steps; on right going up, on left going up, and can reach both to garage Has following equipment at home: Single point cane, Walker - 2 wheeled, shower chair, and Grab bars  OCCUPATION: Retired  PLOF: Independent and Leisure: daily stretching/exercises ~20 min/day; walk 1/4 mile daily; active in church - bible study    PATIENT GOALS: "To be be able to walk safely after my shoulder surgery."   OBJECTIVE:   DIAGNOSTIC FINDINGS:  N/A  COGNITION: Overall cognitive status: Within functional limits for tasks assessed   SENSATION: WFL  COORDINATION: WFL other than limitations in motion due to joint stiffness  PALPATION: Increased muscle tension and TTP in L>R SCM, cervical paraspinals, scalenes, UT and LS   CERVICAL ROM:    Active ROM AROM  09/02/22  10/04/22  Flexion 34 53  Extension 35 24  Right lateral flexion 21 25  Left lateral flexion 20 25  Right rotation 30 29  Left rotation 29 26   (Blank rows = not tested)  UPPER EXTREMITY ROM:   Active ROM Right 09/02/22 Left 09/02/22   Shoulder flexion 168 53  Shoulder extension    Shoulder abduction 179 51  Shoulder adduction    Shoulder internal rotation    Shoulder external rotation 69 39  Elbow flexion    Elbow extension    Wrist flexion    Wrist extension    Wrist ulnar deviation    Wrist radial deviation    Wrist pronation    Wrist supination     (Blank rows = not tested)  UPPER EXTREMITY MMT:   MMT Right 09/02/22 Left NT d/t ROM restrictions  Shoulder flexion 4+   Shoulder extension 4+   Shoulder abduction 4+   Shoulder adduction    Shoulder internal rotation 5   Shoulder external rotation 4+   Middle trapezius    Lower trapezius    Elbow flexion    Elbow extension    Wrist flexion    Wrist extension    Wrist ulnar deviation    Wrist radial deviation    Wrist pronation    Wrist supination    Grip strength     (Blank rows = not tested)  CERVICAL SPECIAL TESTS:  Spurling's test: Negative and Distraction test: Negative  MUSCLE LENGTH: Hamstrings: mod tight B ITB: mild/mod tight R>L Piriformis: mod tight B Hip flexors: mild/mod tight B Quads: mild/mod tight B Heelcord: WFL  POSTURE:  rounded shoulders, forward head, decreased thoracic kyphosis, weight shift right, and scoliosis with R lateral shift of head and shoulders  LOWER EXTREMITY ROM:    Limited B hip ER but otherwise WFL  LOWER EXTREMITY MMT:    MMT Right Eval Left Eval  Hip flexion 4 4  Hip extension 4 4  Hip abduction 4 4  Hip adduction 4 4  Hip internal rotation 4+ 4+  Hip external rotation 4+ 4+  Knee flexion 5 5  Knee extension 5 5  Ankle dorsiflexion 4+ 4+  Ankle plantarflexion 5 5  Ankle inversion    Ankle eversion    (Blank rows = not tested)  BED MOBILITY:  Sit to supine Complete Independence Supine to sit Complete Independence Rolling to Right Complete Independence Rolling to Left Complete Independence  TRANSFERS: Assistive device utilized: None  Sit to stand: Complete Independence Stand to  sit: Complete Independence Chair to chair: Complete Independence Floor:  NT  GAIT: Gait pattern: decreased arm swing- Right, decreased arm swing- Left, lateral lean- Right,  and decreased trunk rotation Distance walked: 120 Assistive device utilized: None Level of assistance: Complete Independence Comments: Gait speed = 3.17 ft/sec  RAMP: Level of Assistance:  NT Assistive device utilized: None Ramp Comments:   CURB:  Level of Assistance:  NT Assistive device utilized: None Curb Comments:   STAIRS:  Level of Assistance: SBA  Stair Negotiation Technique: Alternating Pattern  with Single Rail on Right  Number of Stairs: 14   Height of Stairs: 7   FUNCTIONAL TESTS:  5 times sit to stand: 16.23 sec; >16 sec indicates fall risk with PD, >15 sec indicates risk for recurrent falls with geriatric population Timed up and go (TUG): 10.78 sec (Normal), 10.59 sec (Manual), 12.25 sec (Cognitive) 10 meter walk test: 10.34 sec (pt noting DOE), Gait speed: 3.17 ft/sec  Berg Balance Scale: 51/56; 46-51 moderate fall risk (>50%)  Functional gait assessment: 19/30; 19-24 = medium risk fall  Baselines as of D/C from last PT episode (02/2022): 5 times sit to stand: 12.53 sec Timed up and go (TUG): 8.88 sec (Normal) 10 meter walk test: 8.82 sec; Gait speed: 3.72 ft/sec  Berg Balance Scale: 52/56 Functional gait assessment: 26/30  PATIENT SURVEYS:  ABC scale 1470 / 1600 = 91.9 %   TODAY'S TREATMENT:  10/04/22 THERAPEUTIC EXERCISE: to improve flexibility, strength and mobility.  Verbal and tactile cues throughout for technique. NuStep L4 x 6 min (B LE & R UE)  THERAPEUTIC ACTIVITIES: Cervical ROM assessment Berg = 54/56 5xSTS = 12.31 sec  SELF CARE: HEP review and review of seated and standing PWR! Moves with modification for L UE   09/30/22 Resting HR: 95 bpm  THERAPEUTIC EXERCISE: to improve flexibility, strength and mobility.  Verbal and tactile cues throughout for  technique. NuStep L5 x 6 min- LE only, HR after warmup: 105 bpm Forward Step Ups 6" With Runners High Knee R/L 1x10- Lateral Steps 6" R/L 1x10- slight increase in shoulder blade pain but not intolerable- HR after 122bpm Side Stepping Hip Abduction at counter with RTB 3x10 steps R/L- cues to keep tension on the band throughout  NEUROMUSCULAR RE-EDUCATION: To improve posture, balance, coordination, reduce fall risk, and amplitude of movement. PWR! Moves Step Through Fwd + Back R/L1x3- deferred due to increased L shoulder pain  MANUAL THERAPY: To promote normalized muscle tension and reduced pain.  TrP release and STM of L rhomboids   09/27/22 Resting VS:   BP: 115/60   HR: 86 bpm   SpO2: 98%  THERAPEUTIC EXERCISE: to improve flexibility, strength and mobility.  Verbal and tactile cues throughout for technique. NuStep L6 x 6 min (B LE & R UE) - VS after warm-up: HR: 108-115 bpm, SpO2: 99% 6" fwd step-ups 2 x 10 - 1 set each leading with R & L foot 6" lateral step-ups x 10 bil - HR:121 bpm 6" fwd step-over alt R & L lead foot x 10 - HR:124 bpm  NEUROMUSCULAR RE-EDUCATION: To improve balance, proprioception, coordination, reduce fall risk, amplitude of movement, speed of movement to reduce bradykinesia, and reduce rigidity. PWR! Step through fwd & back x 10 each leg, single UE support on counter PWR! Up + fwd step & back step isolating directions w/o UE support x 10 PWR! Up + step-through w/o UE support x 10   PATIENT EDUCATION: Education details: HEP review and PWR! Moves - Seated & Standing review with modifications for L UE Person educated: Patient Education method: Explanation and Handouts Education comprehension: verbalized understanding   HOME EXERCISE  PROGRAM: Access Code: [email or web address] URL: [email or web address] Date: 09/20/2022 Prepared by: Glenetta Hew  Exercises - Seated Cervical Retraction  - 2-3 x daily - 7 x weekly - 2 sets - 10 reps - 3-5 sec hold -  Sternocleidomastoid Stretch (Mirrored)  - 2-3 x daily - 7 x weekly - 3 reps - 30 sec hold - Seated Scalene Stretch with Towel (Mirrored)  - 2-3 x daily - 7 x weekly - 3 reps - 30 sec hold - Seated Hamstring Stretch  - 2 x daily - 7 x weekly - 3 reps - 30 sec hold - Seated Thoracic Extension AROM  - 2 x daily - 7 x weekly - 2 sets - 10 reps - 3 sec hold - Seated Hip Adduction Isometrics with Ball  - 1 x daily - 3-4 x weekly - 2 sets - 10 reps - 3-5 sec hold - Seated March with Resistance  - 1 x daily - 3-4 x weekly - 2 sets - 10 reps - 3 sec hold - Seated Hip Abduction with Resistance  - 1 x daily - 3-4 x weekly - 2 sets - 10 reps - 3 sec hold  Patient Education - Check for Safety  PWR! Moves - Seated - Standing   ASSESSMENT:  CLINICAL IMPRESSION: Ezariah continues to report variable L-sided neck pain along with L shoulder and scapular pain which interferes with her activity tolerance but some gains noted in cervical flexion and B side-bending. She notes awareness of improving balance, with gains noted on Berg to 54/56 and 5xSTS reduced to 12.31 sec - LTGs # 5 and 7 now met. She states she receives the monthly emails regarding [employer] and is aware of the associated resources, but has yet to be able to join in due to connectivity issues and appointment conflicts - LTG #9 met. HEP and PWR! Moves reviewed today with no concerns identified - LTG #1 met. Will plan for assessment of remaining goals next visit as part of discharge assessment as pt transitions to her HEP prior to her upcoming L TSA surgery scheduled for 10/14/22.   OBJECTIVE IMPAIRMENTS: Abnormal gait, cardiopulmonary status limiting activity, decreased activity tolerance, decreased balance, decreased coordination, decreased endurance, decreased mobility, difficulty walking, decreased ROM, decreased strength, hypomobility, increased fascial restrictions, impaired perceived functional ability, increased muscle  spasms, impaired flexibility, improper body mechanics, postural dysfunction, and pain.   ACTIVITY LIMITATIONS: carrying, lifting, sleeping, bed mobility, reach over head, and locomotion level  PARTICIPATION LIMITATIONS: meal prep, cleaning, laundry, shopping, and community activity  PERSONAL FACTORS: Age, Past/current experiences, Time since onset of injury/illness/exacerbation, and 3+ comorbidities: R shoulder advanced OA - scheduled for reverse TSA on 10/14/22; R TKA 07/2019, B THR 2018 & 2017, posterior C1-2 cervical fusion 2018, rupture of flexor tendon of L hand with failed surgical repair 2020, Parkinson's, bladder cancer, HTN   are also affecting patient's functional outcome.   REHAB POTENTIAL: Good  CLINICAL DECISION MAKING: Evolving/moderate complexity  EVALUATION COMPLEXITY: Moderate   GOALS: Goals reviewed with patient? Yes  SHORT TERM GOALS: Target date: 09/15/2022   Patient will be independent with initial HEP. Baseline:  Goal status: MET  09/13/22  2.  Patient will be educated on strategies to decrease risk of falls.  Baseline:  Goal status: MET  09/13/22  LONG TERM GOALS: Target date: 10/06/2022   Patient will be independent with ongoing/advanced HEP for self-management at home incorporating PWR! Moves as indicated .  Baseline:  Goal status: MET  10/04/22  2.  Patient will be able to ambulate 600' with LRAD with good safety to access community.  Baseline:  Goal status: IN PROGRESS  3.  Patient will be able to step up/down curb safely with LRAD for safety with community ambulation.  Baseline:  Goal status: IN PROGRESS   4.  Patient to improve cervical AROM by >/= 25% with pain decreased by >/= 50% with movement to allow for increased participation in church services and improve safety with driving   Baseline:  Goal status: NOT MET  10/04/22 - gains noted in flexion and B side-bending but pain unchanged   5.  Patient will improve 5x STS time to </= 14.8  seconds to demonstrate improved functional strength and transfer efficiency per age appropriate norms. Baseline: 16.23 sec Goal status: MET  10/04/22 - 5xSTS = 12.31 sec  6.  Patient will demonstrate at least 24/30 on FGA to improve gait stability and reduce risk for falls. (MCID = 4 points) Baseline: 19/30 Goal status: IN PROGRESS  7.  Patient will improve Berg score to >/= 54/56 to improve safety and stability with ADLs in standing and reduce risk for falls.  Baseline: 51/56 Goal status: MET  10/04/22 - Berg = 54/56  8.  Patient will maintain or improve score on ABC scale to demonstrate improved balance confidence and decreased risk for falls. Baseline: 1470 / 1600 = 91.9% Goal status: IN PROGRESS  9. Patient will verbalize understanding of local Parkinson's disease community resources, including community fitness post d/c. Baseline:  Goal status: MET  10/04/22   PLAN: PT FREQUENCY: 2x/week  PT DURATION: 6 weeks  PLANNED INTERVENTIONS: Therapeutic exercises, Therapeutic activity, Neuromuscular re-education, Balance training, Gait training, Patient/Family education, Self Care, Joint mobilization, Dry Needling, Electrical stimulation, Spinal mobilization, Cryotherapy, Moist heat, Taping, Ultrasound, Ionotophoresis 4mg /ml Dexamethasone, Manual therapy, and Re-evaluation  PLAN FOR NEXT SESSION:  remaining LTG assessment, transition to HEP with discharge from PT   Marry Guan, PT 10/04/2022, 4:03 PM  PHYSICAL THERAPY DISCHARGE SUMMARY  Visits from Start of Care: 20  Current functional level related to goals / functional outcomes: Refer to above clinical impression and goal assessment for status as of last visit on 10/04/22. Patient was unable to return for final visit as planned therefore unable to complete remainder of discharge assessment.     Remaining deficits: As above. Unable to complete formal discharge assessment as patient unable to return for final visit.     Education / Equipment: HEP, PWR! Moves, fall risk prevention, community based PD activities  Patient agrees to discharge. Patient goals were partially met. Patient is being discharged due to  scheduled for upcoming reverse TSA on 10/14/2022.   Marry Guan, PT 10/11/22, 10:53 AM  [facility] [address]  [address] [place], [place], [address] Phone: [phone]   Fax:  [phone]

## 2022-10-04 NOTE — Progress Notes (Unsigned)
Cardiology Office Note:    Date:  10/05/2022   ID:  Stephanie [name], DOB 01-06-1932, MRN [id number]  PCP:  Mosie Lukes, MD  [facility] Cardiologist:  Fransico Him, MD     Referring MD: Eldridge Abrahams, MD   Chief Complaint:  Shortness of Breath    Patient Profile: Hypertension  Hyperlipidemia  Myoview 08/27/22: no ischemia or infarction, EF 73, low risk  Echo 08/11/22: GLS -17.1, EF 60-65, no RWMA, Gr 1 DD, normal RVSF, mild MR, RAP 3 Psoriatic arthritis  Bladder CA Parkinson's disease  Spinal stenosis  Anxiety/Depression   Cardiac Studies & Procedures     STRESS TESTS  MYOCARDIAL PERFUSION IMAGING 08/27/2022  Narrative   The study is normal. The study is low risk.   No ST deviation was noted.   LV perfusion is normal. There is no evidence of ischemia. There is no evidence of infarction.   Left ventricular function is normal. Nuclear stress EF: 73 %. The left ventricular ejection fraction is hyperdynamic (>65%). End diastolic cavity size is normal. End systolic cavity size is normal.   Prior study not available for comparison.   ECHOCARDIOGRAM  ECHOCARDIOGRAM COMPLETE 08/11/2022  Narrative ECHOCARDIOGRAM REPORT    Patient Name:   Stephanie [name] Date of Exam: 08/11/2022 Medical Rec #:  [id number]       Height:       62.0 in Accession #:    [id number]      Weight:       118.6 lb Date of Birth:  Apr 05, 1932       BSA:          1.531 m Patient Age:    88 years        BP:           122/79 mmHg Patient Gender: F               HR:           81 bpm. Exam Location:  [facility]  Procedure: 2D Echo, Cardiac Doppler, Color Doppler and Strain Analysis  Indications:    Dyspnea  History:        Patient has prior history of Echocardiogram examinations. Signs/Symptoms:Shortness of Breath. Rheumatoid arthritis.  Sonographer:    Merrie Roof RDCS Referring Phys: [phone] STACEY A BLYTH   Sonographer Comments: Global longitudinal strain was  attempted. IMPRESSIONS   1. GLS -17.1. Left ventricular ejection fraction, by estimation, is 60 to 65%. Left ventricular ejection fraction by PLAX is 66 %. The left ventricle has normal function. The left ventricle has no regional wall motion abnormalities. Left ventricular diastolic parameters are consistent with Grade I diastolic dysfunction (impaired relaxation). 2. Right ventricular systolic function is normal. The right ventricular size is normal. 3. The mitral valve is normal in structure. Mild mitral valve regurgitation. No evidence of mitral stenosis. 4. The aortic valve is normal in structure. Aortic valve regurgitation is not visualized. No aortic stenosis is present. 5. The inferior vena cava is normal in size with greater than 50% respiratory variability, suggesting right atrial pressure of 3 mmHg.  FINDINGS Left Ventricle: GLS -17.1. Left ventricular ejection fraction, by estimation, is 60 to 65%. Left ventricular ejection fraction by PLAX is 66 %. The left ventricle has normal function. The left ventricle has no regional wall motion abnormalities. The left ventricular internal cavity size was normal in size. There is no left ventricular hypertrophy. Left ventricular diastolic parameters are consistent with Grade I diastolic  dysfunction (impaired relaxation).  Right Ventricle: The right ventricular size is normal. No increase in right ventricular wall thickness. Right ventricular systolic function is normal.  Left Atrium: Left atrial size was normal in size.  Right Atrium: Right atrial size was normal in size.  Pericardium: There is no evidence of pericardial effusion.  Mitral Valve: The mitral valve is normal in structure. Mild mitral valve regurgitation. No evidence of mitral valve stenosis.  Tricuspid Valve: The tricuspid valve is normal in structure. Tricuspid valve regurgitation is trivial. No evidence of tricuspid stenosis.  Aortic Valve: The aortic valve is normal in  structure. Aortic valve regurgitation is not visualized. No aortic stenosis is present. Aortic valve mean gradient measures 6.0 mmHg. Aortic valve peak gradient measures 10.9 mmHg. Aortic valve area, by VTI measures 1.48 cm.  Pulmonic Valve: The pulmonic valve was normal in structure. Pulmonic valve regurgitation is not visualized. No evidence of pulmonic stenosis.  Aorta: The aortic root is normal in size and structure.  Venous: The inferior vena cava is normal in size with greater than 50% respiratory variability, suggesting right atrial pressure of 3 mmHg.  IAS/Shunts: No atrial level shunt detected by color flow Doppler.   LEFT VENTRICLE PLAX 2D LV EF:         Left            Diastology ventricular     LV e' medial:    10.40 cm/s ejection        LV E/e' medial:  7.0 fraction by     LV e' lateral:   9.46 cm/s PLAX is 66      LV E/e' lateral: 7.7 %. LVIDd:         3.90 cm LVIDs:         2.50 cm LV PW:         0.80 cm LV IVS:        0.90 cm LVOT diam:     1.80 cm LV SV:         46 LV SV Index:   30 LVOT Area:     2.54 cm   RIGHT VENTRICLE RV Basal diam:  3.70 cm RV S prime:     11.50 cm/s TAPSE (M-mode): 2.3 cm  LEFT ATRIUM             Index        RIGHT ATRIUM           Index LA diam:        2.60 cm 1.70 cm/m   RA Area:     14.20 cm LA Vol (A2C):   21.1 ml 13.78 ml/m  RA Volume:   37.20 ml  24.30 ml/m LA Vol (A4C):   36.7 ml 23.97 ml/m LA Biplane Vol: 29.9 ml 19.53 ml/m AORTIC VALVE AV Area (Vmax):    1.45 cm AV Area (Vmean):   1.51 cm AV Area (VTI):     1.48 cm AV Vmax:           165.00 cm/s AV Vmean:          112.000 cm/s AV VTI:            0.311 m AV Peak Grad:      10.9 mmHg AV Mean Grad:      6.0 mmHg LVOT Vmax:         94.00 cm/s LVOT Vmean:        66.300 cm/s LVOT VTI:  0.181 m LVOT/AV VTI ratio: 0.58  AORTA Ao Root diam: 2.50 cm Ao Asc diam:  2.80 cm Ao Arch diam: 2.5 cm  MITRAL VALVE                TRICUSPID VALVE MV Area  (PHT): 2.61 cm     TR Peak grad:   21.3 mmHg MV Decel Time: 291 msec     TR Vmax:        231.00 cm/s MV E velocity: 72.80 cm/s MV A velocity: 102.00 cm/s  SHUNTS MV E/A ratio:  0.71         Systemic VTI:  0.18 m Systemic Diam: 1.80 cm  Jenne Campus MD Electronically signed by Jenne Campus MD Signature Date/Time: 08/11/2022/5:10:19 PM    Final              History of Present Illness:   Stephanie [name] is a 88 y.o. female with the above problem list.  She was last seen 08/24/22 for surgical clearance by Finis Bud, NP. She needs reverse shoulder replacement with Dr. Onnie Graham on 10/14/22. The pt noted symptoms of dyspnea on exertion. A f/u Myoview was obtained. This was low risk and neg for ischemia. She returns for f/u. She is here alone. She continues to have dyspnea on exertion. She has been having symptoms for the last 6 mos. She has not had orthopnea, paroxysmal nocturnal dyspnea, chest pain, leg edema. She does not wheeze. She has a remote hx of smoking (1965).      Past Medical History:  Diagnosis Date   Allergy    Anemia    past hx    Arthropathy of cervical spine 11/27/2012   Right  Xray at [facility]  On 04/08/2016  AP, lateral, and lateral flexion and extension views of the cervical spine are submitted for evaluation.   No acute fracture identified in the cervical spine.   There is redemonstration of approximately 2 mm of C3 on C4 anterolisthesis in neutral which slightly increases in flexion relative to neutral and extension. Slight C5 on C6 retrolisthesis does not change i   Benign hypertension without congestive heart failure    Benign mole    right hcets mole, bleeds when dried with towel   Bilateral dry eyes    Bladder cancer (HCC) 2016/10/25   Cervical spondylosis    Cervicalgia    2 screws in place    Depression with anxiety 10-25-16   prior to husbands death    Dyspnea    Gross hematuria    developed after first hip replacment, led to indicental finding of  bladder tumor, bleeding now resolved    History of kidney stones    1970's   Left hand weakness    due to reinjury of flxor tendon s/p tendon surgery march 12-2018   Lumbar stenosis    Malignant tumor of urinary bladder ([place]) 07/2016   Pre-stage I   OA (osteoarthritis)    Parkinson disease dx 2012   neurologist-  dr siddiqui at [facility])     Dr Trudie Reed , [facility]    Rupture of flexor tendon of hand 2020   right   Current Medications: Current Meds  Medication Sig   amLODipine (NORVASC) 5 MG tablet TAKE 1 TABLET (5 MG TOTAL) BY MOUTH DAILY.   Apoaequorin (PREVAGEN PO) Take 1 tablet by mouth in the morning.   carbidopa-levodopa (SINEMET IR) 25-100 MG per tablet Take 0.5-1 tablets by mouth  See admin instructions. Take 1 tablet by mouth in the morning upon waking & take 0.5 tablet by mouth every 2 hours thereafter for a total of 6 tablets daily.   Carbidopa-Levodopa ER (SINEMET CR) 25-100 MG tablet controlled release Take 1 tablet by mouth at bedtime.   cholecalciferol (VITAMIN D3) 25 MCG (1000 UNIT) tablet Take 1,000 Units by mouth in the morning.   Coenzyme Q10 (COQ10 PO) Take 1 tablet by mouth in the morning.   diazepam (VALIUM) 5 MG tablet TAKE 1 TABLET (5 MG TOTAL) BY MOUTH EVERY 12 (TWELVE) HOURS AS NEEDED. FOR ANXIETY   DIGESTIVE ENZYMES PO Take 1 capsule by mouth 3 (three) times daily before meals. Active Enzymes by Delynn Flavin   EPINEPHrine 0.3 mg/0.3 mL IJ SOAJ injection Inject 0.3 mg into the muscle as needed for anaphylaxis.   estradiol (ESTRACE) 0.5 MG tablet TAKE 1/2 OF A TABLET (0.25 MG TOTAL) BY MOUTH EVERY DAY   fluticasone (CUTIVATE) 0.05 % cream Apply 1 application topically daily as needed (scalp psoriasis).   golimumab (SIMPONI ARIA) 50 MG/4ML SOLN injection Inject 50 mg into the vein See admin instructions. Given every 2 months - Last given 06/28/2019   hydrochlorothiazide (HYDRODIURIL) 25 MG tablet Take 1 tablet (25 mg total) by mouth  daily.   medroxyPROGESTERone (PROVERA) 2.5 MG tablet TAKE 0.5 TABLETS BY MOUTH DAILY.   melatonin 5 MG TABS Take 5 mg by mouth at bedtime.   meloxicam (MOBIC) 15 MG tablet Take 15 mg by mouth daily as needed (joint pain (max 3-4 times/weekly)).   Multiple Vitamin (MULTIVITAMIN WITH MINERALS) TABS tablet Take 1 tablet by mouth in the morning. Centrum Silver   Polyethyl Glycol-Propyl Glycol (SYSTANE) 0.4-0.3 % SOLN Place 1-2 drops into both eyes 3 (three) times daily as needed (dry/irritated eyes.).   Probiotic Product (PROBIOTIC PO) Take 1 capsule by mouth in the morning and at bedtime. Restore Ultimate Probiotic    Allergies:   Cleocin [clindamycin], Codeine, Hornet venom, Penicillins, Vibramycin [doxycycline], Yellow jacket venom, Lodine [etodolac], and Tramadol   Social History   Occupational History   Not on file  Tobacco Use   Smoking status: Former    Years: 10.00    Types: Cigarettes    Quit date: 05/04/1964    Years since quitting: 58.4   Smokeless tobacco: Never  Vaping Use   Vaping Use: Never used  Substance and Sexual Activity   Alcohol use: Yes    Alcohol/week: 7.0 standard drinks of alcohol    Types: 7 Glasses of wine per week    Comment: ocassionally- social    Drug use: No   Sexual activity: Not Currently    Partners: Male    Family Hx: The patient's family history includes Arthritis in her father, mother, and sister; Bladder Cancer in her father; Deep vein thrombosis in her mother; Diabetes in her sister; Esophageal cancer in her paternal aunt; Heart disease in her maternal grandfather, maternal grandmother, and sister; Hypertension in her mother and sister; Obesity in her sister; Peripheral vascular disease in her paternal grandmother; Stroke in her father and paternal grandfather. There is no history of Colon cancer, Colon polyps, Rectal cancer, or Stomach cancer.  Review of Systems  Gastrointestinal:  Negative for hematochezia and melena.  Genitourinary:   Negative for hematuria.     EKGs/Labs/Other Test Reviewed:    EKG:  EKG is not ordered today.     Recent Labs: 08/02/2022: TSH 1.32 09/28/2022: ALT 4; BUN 18; Creatinine, Ser 0.74; Potassium  3.3; Sodium 134 10/01/2022: Hemoglobin 13.2; Platelets 202   Recent Lipid Panel Recent Labs    08/02/22 1058  CHOL 163  TRIG 89.0  HDL 60.20  VLDL 17.8  LDLCALC 85      Risk Assessment/Calculations/Metrics:              Physical Exam:    VS:  BP 104/60   Pulse 98   Ht '5\' 2"'$  (1.575 m)   Wt 116 lb 3.2 oz (52.7 kg)   LMP 01/13/2013 Comment: spotting-had benign endometrial biopsy   SpO2 98%   BMI 21.25 kg/m     Wt Readings from Last 3 Encounters:  10/05/22 116 lb 3.2 oz (52.7 kg)  10/01/22 116 lb (52.6 kg)  08/27/22 119 lb (54 kg)    Constitutional:      Appearance: Healthy appearance. Not in distress.  Neck:     Vascular: JVD normal.  Pulmonary:     Effort: Pulmonary effort is normal.     Breath sounds: No wheezing. No rales.  Cardiovascular:     Normal rate. Regular rhythm. Normal S1. Normal S2.      Murmurs: There is no murmur.  Edema:    Peripheral edema absent.  Abdominal:     Palpations: Abdomen is soft.  Skin:    General: Skin is warm and dry.  Neurological:     Mental Status: Alert and oriented to person, place and time.          ASSESSMENT & PLAN:   Preoperative cardiovascular examination Ms. Ruble's perioperative risk of a major cardiac event is 0.4% according to the Revised Cardiac Risk Index (RCRI).  Therefore, she is at low risk for perioperative complications.    A recent nuclear stress test was done which was low risk. An echocardiogram in Sept 2023 demonstrated normal EF and mild diastolic dysfunction. Recommendations: According to ACC/AHA guidelines, no further cardiovascular testing needed.  From a cardiology perspective, she is at acceptable risk to proceed with her surgery. However, she continues to be short of breath. I will check a BNP  today. She does have an evaluation with Pulmonology soon. I think it would be best to undergo Pulmonary evaluation prior to proceeding with her surgery.     SOB (shortness of breath) As noted EF is normal on echocardiogram. She had normal GLS. She does have grade 1 diastolic dysfunction. There is no evidence of volume excess on exam. A recent nuclear stress test is normal. I will get a BNP to complete rule out CHF. She has an evaluation with pulmonology pending.   HTN (hypertension) The patient's blood pressure is controlled on her current regimen.  Continue current therapy.              Dispo:  Return in about 6 months (around 04/05/2023) for Routine Follow Up w/ Dr. Radford Pax.   Medication Adjustments/Labs and Tests Ordered: Current medicines are reviewed at length with the patient today.  Concerns regarding medicines are outlined above.  Tests Ordered: Orders Placed This Encounter  Procedures   Pro b natriuretic peptide (BNP)   Medication Changes: No orders of the defined types were placed in this encounter.  Signed, Richardson Dopp, PA-C  10/05/2022 11:58 AM    [facility] [address], [place], [place]  [address] Phone: [phone]; Fax: [phone]

## 2022-10-05 ENCOUNTER — Ambulatory Visit: Payer: Medicare Other | Attending: Physician Assistant | Admitting: Physician Assistant

## 2022-10-05 ENCOUNTER — Encounter: Payer: Self-pay | Admitting: Physician Assistant

## 2022-10-05 VITALS — BP 104/60 | HR 98 | Ht 62.0 in | Wt 116.2 lb

## 2022-10-05 DIAGNOSIS — Z01810 Encounter for preprocedural cardiovascular examination: Secondary | ICD-10-CM | POA: Insufficient documentation

## 2022-10-05 DIAGNOSIS — I10 Essential (primary) hypertension: Secondary | ICD-10-CM | POA: Diagnosis not present

## 2022-10-05 DIAGNOSIS — R0602 Shortness of breath: Secondary | ICD-10-CM | POA: Diagnosis not present

## 2022-10-05 NOTE — Assessment & Plan Note (Addendum)
Stephanie [name] perioperative risk of a major cardiac event is 0.4% according to the Revised Cardiac Risk Index (RCRI).  Therefore, she is at low risk for perioperative complications.    A recent nuclear stress test was done which was low risk. An echocardiogram in Sept 2023 demonstrated normal EF and mild diastolic dysfunction. Recommendations: According to ACC/AHA guidelines, no further cardiovascular testing needed.  From a cardiology perspective, she is at acceptable risk to proceed with her surgery. However, she continues to be short of breath. I will check a BNP today. She does have an evaluation with Pulmonology soon. I think it would be best to undergo Pulmonary evaluation prior to proceeding with her surgery.

## 2022-10-05 NOTE — Assessment & Plan Note (Signed)
As noted EF is normal on echocardiogram. She had normal GLS. She does have grade 1 diastolic dysfunction. There is no evidence of volume excess on exam. A recent nuclear stress test is normal. I will get a BNP to complete rule out CHF. She has an evaluation with pulmonology pending.

## 2022-10-05 NOTE — Assessment & Plan Note (Signed)
The patient's blood pressure is controlled on her current regimen.  Continue current therapy.   

## 2022-10-05 NOTE — Patient Instructions (Signed)
Medication Instructions:  Your physician recommends that you continue on your current medications as directed. Please refer to the Current Medication list given to you today.  *If you need a refill on your cardiac medications before your next appointment, please call your pharmacy*   Lab Work: TODAY:  PRO BNP  If you have labs (blood work) drawn today and your tests are completely normal, you will receive your results only by: [place] (if you have MyChart) OR A paper copy in the mail If you have any lab test that is abnormal or we need to change your treatment, we will call you to review the results.   Testing/Procedures: None ordered   Follow-Up: At [facility], you and your health needs are our priority.  As part of our continuing mission to provide you with exceptional heart care, we have created designated Provider Care Teams.  These Care Teams include your primary Cardiologist (physician) and Advanced Practice Providers (APPs -  Physician Assistants and Nurse Practitioners) who all work together to provide you with the care you need, when you need it.  We recommend signing up for the patient portal called "MyChart".  Sign up information is provided on this After Visit Summary.  MyChart is used to connect with patients for Virtual Visits (Telemedicine).  Patients are able to view lab/test results, encounter notes, upcoming appointments, etc.  Non-urgent messages can be sent to your provider as well.   To learn more about what you can do with MyChart, go to [email or web address].    Your next appointment:   6 month(s)  The format for your next appointment:   In Person  Provider:   Fransico Him, MD     Other Instructions   Important Information About Sugar

## 2022-10-06 ENCOUNTER — Encounter: Payer: Self-pay | Admitting: Physical Therapy

## 2022-10-06 ENCOUNTER — Ambulatory Visit: Payer: Medicare Other | Admitting: Physical Therapy

## 2022-10-06 DIAGNOSIS — R2681 Unsteadiness on feet: Secondary | ICD-10-CM

## 2022-10-06 DIAGNOSIS — M25512 Pain in left shoulder: Secondary | ICD-10-CM | POA: Diagnosis not present

## 2022-10-06 DIAGNOSIS — R2689 Other abnormalities of gait and mobility: Secondary | ICD-10-CM | POA: Diagnosis not present

## 2022-10-06 DIAGNOSIS — M542 Cervicalgia: Secondary | ICD-10-CM | POA: Diagnosis not present

## 2022-10-06 DIAGNOSIS — M6281 Muscle weakness (generalized): Secondary | ICD-10-CM

## 2022-10-06 DIAGNOSIS — G20A1 Parkinson's disease without dyskinesia, without mention of fluctuations: Secondary | ICD-10-CM | POA: Diagnosis not present

## 2022-10-06 LAB — PRO B NATRIURETIC PEPTIDE: NT-Pro BNP: 284 pg/mL (ref 0–738)

## 2022-10-06 NOTE — Therapy (Signed)
OUTPATIENT PHYSICAL THERAPY TREATMENT / DISCHARGE SUMMARY   Patient Name: Stephanie [name] MRN: [id number] DOB:07-14-1932, 88 y.o., female Today's Date: 10/06/2022      PT End of Session - 10/06/22 1148     Visit Number 11    Date for PT Re-Evaluation 10/06/22    Authorization Type Medicare & BCBS    PT Start Time 1148    PT Stop Time 1218    PT Time Calculation (min) 30 min    Activity Tolerance Patient tolerated treatment well    Behavior During Therapy [facility] for tasks assessed/performed                Past Medical History:  Diagnosis Date   Allergy    Anemia    past hx    Arthropathy of cervical spine 11/27/2012   Right  Xray at [facility]  On 04/08/2016  AP, lateral, and lateral flexion and extension views of the cervical spine are submitted for evaluation.   No acute fracture identified in the cervical spine.   There is redemonstration of approximately 2 mm of C3 on C4 anterolisthesis in neutral which slightly increases in flexion relative to neutral and extension. Slight C5 on C6 retrolisthesis does not change i   Benign hypertension without congestive heart failure    Benign mole    right hcets mole, bleeds when dried with towel   Bilateral dry eyes    Bladder cancer ([place]) 11/16/2016   Cervical spondylosis    Cervicalgia    2 screws in place    Depression with anxiety November 16, 2016   prior to husbands death    Dyspnea    Gross hematuria    developed after first hip replacment, led to indicental finding of bladder tumor, bleeding now resolved    History of kidney stones    1970's   Left hand weakness    due to reinjury of flxor tendon s/p tendon surgery march 12-2018   Lumbar stenosis    Malignant tumor of urinary bladder ([place]) 07/2016   Pre-stage I   OA (osteoarthritis)    Parkinson disease dx 2012   neurologist-  dr siddiqui at [place] [facility])     Dr Trudie Reed , [place] [facility]    Rupture of flexor tendon of hand 2020   right   Past Surgical  History:  Procedure Laterality Date   BIOPSY MASS LEFT FIRST METACARPAL   06/23/2006   benign   CATARACT EXTRACTION W/ INTRAOCULAR LENS  IMPLANT, BILATERAL  1999 and 2003   CERVICAL FUSION  02/2017   c1-c2 ; reports she has 2 screws 2in long in place done at [facility] ; patient exhibits VERY LIMITED NECK ROM   COLONOSCOPY     CYSTOSCOPY W/ RETROGRADES Bilateral 09/22/2016   Procedure: CYSTOSCOPY WITH RETROGRADE PYELOGRAM;  Surgeon: Alexis Frock, MD;  Location: [facility];  Service: Urology;  Laterality: Bilateral;   D & C HYSTEROSCOPY W/ RESECTION POLYP  07/11/2000   DILATION AND CURETTAGE OF UTERUS  1960's   EXCISION CYST AND DEBRIDEMENT RIGHT WIRST AND REMOVAL FORGEIGN BODY  01/09/2009   FLEXOR TENDON REPAIR Left 12/12/2018   Procedure: REPAIR/TRANSFER FLEXOR DIGITORUM PROFUNDUS OF LEFT SMALL FINGER;  Surgeon: Daryll Brod, MD;  Location: [place];  Service: Orthopedics;  Laterality: Left;   LAPAROSCOPY  yrs ago   infertility    METACARPOPHALANGEAL JOINT ARTHRODESIS Right 05/25/1996   TENDON REPAIR Left 01/15/2019   Hand   TONSILLECTOMY  AND ADENOIDECTOMY  child   TOTAL HIP ARTHROPLASTY Left 07/07/2016   Procedure: LEFT TOTAL HIP ARTHROPLASTY ANTERIOR APPROACH;  Surgeon: Gaynelle Arabian, MD;  Location: [facility];  Service: Orthopedics;  Laterality: Left;   TOTAL HIP ARTHROPLASTY Right 09/28/2017   Procedure: RIGHT TOTAL HIP ARTHROPLASTY ANTERIOR APPROACH;  Surgeon: Gaynelle Arabian, MD;  Location: [facility];  Service: Orthopedics;  Laterality: Right;   TOTAL KNEE ARTHROPLASTY Right 07/30/2019   Procedure: TOTAL KNEE ARTHROPLASTY;  Surgeon: Gaynelle Arabian, MD;  Location: [facility];  Service: Orthopedics;  Laterality: Right;  23mn   TRANSURETHRAL RESECTION OF BLADDER TUMOR N/A 09/22/2016   Procedure: TRANSURETHRAL RESECTION OF BLADDER TUMOR (TURBT);  Surgeon: TAlexis Frock MD;  [facility]  Service: Urology;  Laterality: N/A;   Patient  Active Problem List   Diagnosis Date Noted   Preoperative cardiovascular examination 10/04/2022   SOB (shortness of breath) 08/02/2022   Insomnia 08/02/2022   Hyponatremia 08/01/2022   Shoulder pain, left 09/14/2021   History of COVID-19 01/06/2021   Diarrhea 09/03/2020   Vitamin D deficiency 09/03/2020   OA (osteoarthritis) of knee 07/30/2019   Tinea corporis 06/08/2019   Psoriatic arthritis ([place] 04/02/2019   Anemia 03/13/2018   Hot flashes 03/13/2018   S/P cervical spinal fusion 03/31/2017   Neck pain 12/12/2016   Depression with anxiety 10/17/2016   Bladder cancer [place] 10/17/2016   OA (osteoarthritis) of hip 07/07/2016   Spinal stenosis of lumbar region with radiculopathy 04/08/2016   Spondylolisthesis of lumbar region 04/08/2016   Spondylosis of cervical region without myelopathy or radiculopathy 04/08/2016   Preventative health care 01/18/2016   Low back pain 01/18/2016   History of chicken pox    Hyperlipidemia 12/15/2014   Allergic rhinitis 06/25/2014   Allergy to bee sting 06/10/2014   TMJ tenderness 11/16/2013   Other malaise and fatigue 05/07/2013   [place] of [place] 11/27/2012   Muscle spasms of neck 11/27/2012   Parkinson's disease 05/24/2012   Conjunctivochalasis 03/09/2012   Epiretinal membrane 03/09/2012   Hyperopia with astigmatism and presbyopia 03/09/2012   Pseudophakia 03/09/2012   HTN (hypertension) 08/31/2011   Right knee pain 08/31/2011   Benign hypertensive heart disease without heart failure 05/12/2011    PCP: BMosie Lukes MD  REFERRING PROVIDER: SEldridge Abrahams MD   REFERRING DIAG: G20.A1 (ICD-10-CM) - Parkinson's disease without dyskinesia, without mention of fluctuations  THERAPY DIAG:  Other abnormalities of gait and mobility  Unsteadiness on feet  Muscle weakness (generalized)  Cervicalgia  RATIONALE FOR EVALUATION AND TREATMENT: Rehabilitation  ONSET DATE: Diagnosed with PD in ~2012  NEXT MD VISIT:  6 months with PA   SUBJECTIVE:      SUBJECTIVE STATEMENT: Pt feels worn out today after going to the dentist and the cardiologist yesterday. She got a good report from the cardiologist. She also was able to talk to her doctor about getting PT referral for her shoulder post surgery.   PAIN:  Are you having pain? No  PERTINENT HISTORY:  L shoulder advanced OA - scheduled for reverse TSA on 10/14/22; R TKA 07/2019, B THR 2018 & 2017, posterior C1-2 cervical fusion 2018, rupture of flexor tendon of L hand with failed surgical repair 2020, Parkinson's, bladder cancer, HTN   PRECAUTIONS: None  WEIGHT BEARING RESTRICTIONS No  FALLS: Has patient fallen in last 6 months? No  LIVING ENVIRONMENT: Lives with: lives alone Lives in: House/apartment Stairs: Yes: External: 4 steps; on right going up, on left going up, and  can reach both to garage Has following equipment at home: Single point cane, Walker - 2 wheeled, shower chair, and Grab bars  OCCUPATION: Retired  PLOF: Independent and Leisure: daily stretching/exercises ~20 min/day; walk 1/4 mile daily; active in church - bible study    PATIENT GOALS: "To be be able to walk safely after my shoulder surgery."   OBJECTIVE:   DIAGNOSTIC FINDINGS:  N/A  COGNITION: Overall cognitive status: Within functional limits for tasks assessed   SENSATION: WFL  COORDINATION: WFL other than limitations in motion due to joint stiffness  PALPATION: Increased muscle tension and TTP in L>R SCM, cervical paraspinals, scalenes, UT and LS   CERVICAL ROM:    Active ROM AROM  09/02/22  10/04/22  Flexion 34 53  Extension 35 24  Right lateral flexion 21 25  Left lateral flexion 20 25  Right rotation 30 29  Left rotation 29 26   (Blank rows = not tested)  UPPER EXTREMITY ROM:   Active ROM Right 09/02/22 Left 09/02/22  Shoulder flexion 168 53  Shoulder extension    Shoulder abduction 179 51  Shoulder adduction    Shoulder internal  rotation    Shoulder external rotation 69 39  Elbow flexion    Elbow extension    Wrist flexion    Wrist extension    Wrist ulnar deviation    Wrist radial deviation    Wrist pronation    Wrist supination     (Blank rows = not tested)  UPPER EXTREMITY MMT:   MMT Right 09/02/22 Left NT d/t ROM restrictions  Shoulder flexion 4+   Shoulder extension 4+   Shoulder abduction 4+   Shoulder adduction    Shoulder internal rotation 5   Shoulder external rotation 4+   Middle trapezius    Lower trapezius    Elbow flexion    Elbow extension    Wrist flexion    Wrist extension    Wrist ulnar deviation    Wrist radial deviation    Wrist pronation    Wrist supination    Grip strength     (Blank rows = not tested)  CERVICAL SPECIAL TESTS:  Spurling's test: Negative and Distraction test: Negative  MUSCLE LENGTH: Hamstrings: mod tight B ITB: mild/mod tight R>L Piriformis: mod tight B Hip flexors: mild/mod tight B Quads: mild/mod tight B Heelcord: WFL  POSTURE:  rounded shoulders, forward head, decreased thoracic kyphosis, weight shift right, and scoliosis with R lateral shift of head and shoulders  LOWER EXTREMITY ROM:    Limited B hip ER but otherwise WFL  LOWER EXTREMITY MMT:    MMT Right Eval Left Eval  Hip flexion 4 4  Hip extension 4 4  Hip abduction 4 4  Hip adduction 4 4  Hip internal rotation 4+ 4+  Hip external rotation 4+ 4+  Knee flexion 5 5  Knee extension 5 5  Ankle dorsiflexion 4+ 4+  Ankle plantarflexion 5 5  Ankle inversion    Ankle eversion    (Blank rows = not tested)  BED MOBILITY:  Sit to supine Complete Independence Supine to sit Complete Independence Rolling to Right Complete Independence Rolling to Left Complete Independence  TRANSFERS: Assistive device utilized: None  Sit to stand: Complete Independence Stand to sit: Complete Independence Chair to chair: Complete Independence Floor:  NT  GAIT: Gait pattern: decreased arm  swing- Right, decreased arm swing- Left, lateral lean- Right, and decreased trunk rotation Distance walked: 120 Assistive device utilized: None Level of  assistance: Complete Independence Comments: Gait speed = 3.17 ft/sec  RAMP: Level of Assistance:  NT Assistive device utilized: None Ramp Comments:   CURB:  Level of Assistance:  NT Assistive device utilized: None Curb Comments:   STAIRS:  Level of Assistance: SBA  Stair Negotiation Technique: Alternating Pattern  with Single Rail on Right  Number of Stairs: 14   Height of Stairs: 7   FUNCTIONAL TESTS:  5 times sit to stand: 16.23 sec; >16 sec indicates fall risk with PD, >15 sec indicates risk for recurrent falls with geriatric population Timed up and go (TUG): 10.78 sec (Normal), 10.59 sec (Manual), 12.25 sec (Cognitive) 10 meter walk test: 10.34 sec (pt noting DOE), Gait speed: 3.17 ft/sec  Berg Balance Scale: 51/56; 46-51 moderate fall risk (>50%)  Functional gait assessment: 19/30; 19-24 = medium risk fall  Baselines as of D/C from last PT episode (02/2022): 5 times sit to stand: 12.53 sec Timed up and go (TUG): 8.88 sec (Normal) 10 meter walk test: 8.82 sec; Gait speed: 3.72 ft/sec  Berg Balance Scale: 52/56 Functional gait assessment: 26/30  PATIENT SURVEYS:  ABC scale 1470 / 1600 = 91.9 %   TODAY'S TREATMENT: 10/06/22 THERAPEUTIC EXERCISE: to improve flexibility, strength and mobility.  Verbal and tactile cues throughout for technique. NuStep L5 x 6 min (BLE & RUE)  THERAPEUTIC ACTIVITIES: FGA: 26/30 ABC scale: 86%   10/04/22 THERAPEUTIC EXERCISE: to improve flexibility, strength and mobility.  Verbal and tactile cues throughout for technique. NuStep L4 x 6 min (B LE & R UE)  THERAPEUTIC ACTIVITIES: Cervical ROM assessment Berg = 54/56 5xSTS = 12.31 sec  SELF CARE: HEP review and review of seated and standing PWR! Moves with modification for L UE   09/30/22 Resting HR: 95 bpm  THERAPEUTIC  EXERCISE: to improve flexibility, strength and mobility.  Verbal and tactile cues throughout for technique. NuStep L5 x 6 min- LE only, HR after warmup: 105 bpm Forward Step Ups 6" With Runners High Knee R/L 1x10- Lateral Steps 6" R/L 1x10- slight increase in shoulder blade pain but not intolerable- HR after 122bpm Side Stepping Hip Abduction at counter with RTB 3x10 steps R/L- cues to keep tension on the band throughout  NEUROMUSCULAR RE-EDUCATION: To improve posture, balance, coordination, reduce fall risk, and amplitude of movement. PWR! Moves Step Through Fwd + Back R/L1x3- deferred due to increased L shoulder pain  MANUAL THERAPY: To promote normalized muscle tension and reduced pain.  TrP release and STM of L rhomboids    PATIENT EDUCATION: Education details: recommended frequency for ongoing HEP at discharge to prevent loss of gains achieved with PT Person educated: Patient Education method: Explanation Education comprehension: verbalized understanding   HOME EXERCISE PROGRAM: Access Code: [email or web address] URL: [email or web address] Date: 09/20/2022 Prepared by: Annie Paras  Exercises - Seated Cervical Retraction  - 2-3 x daily - 7 x weekly - 2 sets - 10 reps - 3-5 sec hold - Sternocleidomastoid Stretch (Mirrored)  - 2-3 x daily - 7 x weekly - 3 reps - 30 sec hold - Seated Scalene Stretch with Towel (Mirrored)  - 2-3 x daily - 7 x weekly - 3 reps - 30 sec hold - Seated Hamstring Stretch  - 2 x daily - 7 x weekly - 3 reps - 30 sec hold - Seated Thoracic Extension AROM  - 2 x daily - 7 x weekly - 2 sets - 10 reps - 3 sec hold - Seated Hip Adduction Isometrics with Ball  - 1 x  daily - 3-4 x weekly - 2 sets - 10 reps - 3-5 sec hold - Seated March with Resistance  - 1 x daily - 3-4 x weekly - 2 sets - 10 reps - 3 sec hold - Seated Hip Abduction with Resistance  - 1 x daily - 3-4 x weekly - 2 sets - 10 reps - 3 sec hold  Patient Education - Check for Safety  PWR!  Moves - Seated - Standing   ASSESSMENT:  CLINICAL IMPRESSION: Shacora reports no pain today. This final therapy visit included goal assessment where Louvenia showed improvement on FGA to 26/30. ABC scale was also assessed and showed a decline from initial scoring, she states some days she is more confident then others. She has demonstrated improvement in balance and functional ability. She is able to independently follow her HEP and agreeable to d/c from therapy today with upcoming L TSA surgery scheduled for 10/14/22.   OBJECTIVE IMPAIRMENTS: Abnormal gait, cardiopulmonary status limiting activity, decreased activity tolerance, decreased balance, decreased coordination, decreased endurance, decreased mobility, difficulty walking, decreased ROM, decreased strength, hypomobility, increased fascial restrictions, impaired perceived functional ability, increased muscle spasms, impaired flexibility, improper body mechanics, postural dysfunction, and pain.   ACTIVITY LIMITATIONS: carrying, lifting, sleeping, bed mobility, reach over head, and locomotion level  PARTICIPATION LIMITATIONS: meal prep, cleaning, laundry, shopping, and community activity  PERSONAL FACTORS: Age, Past/current experiences, Time since onset of injury/illness/exacerbation, and 3+ comorbidities: R shoulder advanced OA - scheduled for reverse TSA on 10/14/22; R TKA 07/2019, B THR 2018 & 2017, posterior C1-2 cervical fusion 2018, rupture of flexor tendon of L hand with failed surgical repair 2020, Parkinson's, bladder cancer, HTN   are also affecting patient's functional outcome.   REHAB POTENTIAL: Good  CLINICAL DECISION MAKING: Evolving/moderate complexity  EVALUATION COMPLEXITY: Moderate   GOALS: Goals reviewed with patient? Yes  SHORT TERM GOALS: Target date: 09/15/2022   Patient will be independent with initial HEP. Baseline:  Goal status: MET  09/13/22  2.  Patient will be educated on strategies to decrease risk of  falls.  Baseline:  Goal status: MET  09/13/22  LONG TERM GOALS: Target date: 10/06/2022   Patient will be independent with ongoing/advanced HEP for self-management at home incorporating PWR! Moves as indicated .  Baseline:  Goal status: MET  10/04/22   2.  Patient will be able to ambulate 600' with LRAD with good safety to access community.  Baseline:  Goal status: MET 10/06/22. Pt reports being able to walk 0.3 miles in neighborhood.   3.  Patient will be able to step up/down curb safely with LRAD for safety with community ambulation.  Baseline:  Goal status: MET 10/05/22  4.  Patient to improve cervical AROM by >/= 25% with pain decreased by >/= 50% with movement to allow for increased participation in church services and improve safety with driving   Baseline:  Goal status: NOT MET  10/04/22 - gains noted in flexion and B side-bending but pain unchanged   5.  Patient will improve 5x STS time to </= 14.8 seconds to demonstrate improved functional strength and transfer efficiency per age appropriate norms. Baseline: 16.23 sec Goal status: MET  10/04/22 - 5xSTS = 12.31 sec  6.  Patient will demonstrate at least 24/30 on FGA to improve gait stability and reduce risk for falls. (MCID = 4 points) Baseline: 19/30 Goal status: MET 26/30 10/06/22  7.  Patient will improve Berg score to >/= 54/56 to improve safety and stability with  ADLs in standing and reduce risk for falls.  Baseline: 51/56 Goal status: MET  10/04/22 - Berg = 54/56  8.  Patient will maintain or improve score on ABC scale to demonstrate improved balance confidence and decreased risk for falls. Baseline: 1470 / 1600 = 91.9% Goal status: NOT MET 1380 / 1600 = 86.3 % Pt reports that she is overly cautious.   9. Patient will verbalize understanding of local Parkinson's disease community resources, including community fitness post d/c. Baseline:  Goal status: MET  10/04/22   PLAN: PT FREQUENCY: 2x/week  PT  DURATION: 6 weeks  PLANNED INTERVENTIONS: Therapeutic exercises, Therapeutic activity, Neuromuscular re-education, Balance training, Gait training, Patient/Family education, Self Care, Joint mobilization, Dry Needling, Electrical stimulation, Spinal mobilization, Cryotherapy, Moist heat, Taping, Ultrasound, Ionotophoresis 52m/ml Dexamethasone, Manual therapy, and Re-evaluation  PLAN FOR NEXT SESSION:  d/c to HEP   PHYSICAL THERAPY DISCHARGE SUMMARY  Visits from Start of Care: 11  Current functional level related to goals / functional outcomes: EChrishondahas showed improvement in functional gait, standing and dynamic balance, understanding Parkinson's disease community resources, and following HEP to manage condition independently.    Remaining deficits: EBreckstill has limited neck ROM and occasional pain in R shoulder which she is pending R TSA surgery on 10/14/22.    Education / Equipment:  HEP, PWR! Moves, Parkinson's community resources  Patient agrees to discharge. Patient goals were partially met. Patient is being discharged due to being pleased with the current functional level.    OZeb Comfort Student-PT 10/06/2022, 12:29 PM

## 2022-10-06 NOTE — Progress Notes (Signed)
Anesthesia Chart Review   Case: [id number] Date/Time: 10/14/22 0945   Procedure: REVERSE SHOULDER ARTHROPLASTY (Left: Shoulder) - 158mn   Anesthesia type: General   Pre-op diagnosis: Left shoulder advanced osteoarthritis and rotator cuff arthropathy   Location: [facility]OM 06 / [facility]   Surgeons: SJustice Britain MD       DISCUSSION:88 y.o. former smoker with h/o HTN, Parkinson's disease, left shoulder OA scheduled for above procedure 10/14/2022 with Dr. KJustice Britain   Pt seen by cardiology 10/05/2022 for preoperative evaluation.  Per OV note, 'Stephanie [name]'s perioperative risk of a major cardiac event is 0.4% according to the Revised Cardiac Risk Index (RCRI).  Therefore, she is at low risk for perioperative complications.    A recent nuclear stress test was done which was low risk. An echocardiogram in Sept 2023 demonstrated normal EF and mild diastolic dysfunction. Recommendations: According to ACC/AHA guidelines, no further cardiovascular testing needed.  From a cardiology perspective, she is at acceptable risk to proceed with her surgery. However, she continues to be short of breath. I will check a BNP today. She does have an evaluation with Pulmonology soon. I think it would be best to undergo Pulmonary evaluation prior to proceeding with her surgery."  Pt has pending pulmonology visit 10/13/2022  Addendum 10/13/2022:  Seen by pulmonology today.  Per Dr. HSilas Floodno obvious contraindications, low risk to proceed if surgery less than 2 hours, intermediate risk if longer.  No modifiable risk factors.  VS: BP 137/75   Pulse 87   Temp 36.8 C (Oral)   Ht '5\' 2"'$  (1.575 m)   Wt 52.6 kg   LMP 01/13/2013 Comment: spotting-had benign endometrial biopsy   SpO2 99%   BMI 21.22 kg/m   PROVIDERS: BMosie Lukes MD   LABS: Labs reviewed: Acceptable for surgery. (all labs ordered are listed, but only abnormal results are displayed)  Labs Reviewed  SURGICAL PCR SCREEN  CBC      IMAGES:   EKG:   CV: Myocardial perfusion 08/27/2022   The study is normal. The study is low risk.   No ST deviation was noted.   LV perfusion is normal. There is no evidence of ischemia. There is no evidence of infarction.   Left ventricular function is normal. Nuclear stress EF: 73 %. The left ventricular ejection fraction is hyperdynamic (>65%). End diastolic cavity size is normal. End systolic cavity size is normal.   Prior study not available for comparison.  Echo 08/11/2022 1. GLS -17.1. Left ventricular ejection fraction, by estimation, is 60 to  65%. Left ventricular ejection fraction by PLAX is 66 %. The left  ventricle has normal function. The left ventricle has no regional wall  motion abnormalities. Left ventricular  diastolic parameters are consistent with Grade I diastolic dysfunction  (impaired relaxation).   2. Right ventricular systolic function is normal. The right ventricular  size is normal.   3. The mitral valve is normal in structure. Mild mitral valve  regurgitation. No evidence of mitral stenosis.   4. The aortic valve is normal in structure. Aortic valve regurgitation is  not visualized. No aortic stenosis is present.   5. The inferior vena cava is normal in size with greater than 50%  respiratory variability, suggesting right atrial pressure of 3 mmHg.  Past Medical History:  Diagnosis Date   Allergy    Anemia    past hx    Arthropathy of cervical spine 11/27/2012   [facility]  AP, lateral, and lateral flexion and extension views of the cervical spine are submitted for evaluation.   No acute fracture identified in the cervical spine.   There is redemonstration of approximately 2 mm of C3 on C4 anterolisthesis in neutral which slightly increases in flexion relative to neutral and extension. Slight C5 on C6 retrolisthesis does not change i   Benign hypertension without congestive heart failure    Benign mole    right hcets  mole, bleeds when dried with towel   Bilateral dry eyes    Bladder cancer ([place]) 2016/11/03   Cervical spondylosis    Cervicalgia    2 screws in place    Depression with anxiety 03-Nov-2016   prior to husbands death    Dyspnea    Gross hematuria    developed after first hip replacment, led to indicental finding of bladder tumor, bleeding now resolved    History of kidney stones    1970's   Left hand weakness    due to reinjury of flxor tendon s/p tendon surgery march 12-2018   Lumbar stenosis    Malignant tumor of urinary bladder ([place]) 07/2016   Pre-stage I   OA (osteoarthritis)    Parkinson disease dx 2012   neurologist-  dr siddiqui at [place] [facility])     Dr Trudie Reed , [place] [facility]    Rupture of flexor tendon of hand 2020   right    Past Surgical History:  Procedure Laterality Date   BIOPSY MASS LEFT FIRST METACARPAL   06/23/2006   benign   CATARACT EXTRACTION W/ INTRAOCULAR LENS  IMPLANT, BILATERAL  1999 and 2003   CERVICAL FUSION  02/2017   c1-c2 ; reports she has 2 screws 2in long in place done at [facility] ; patient exhibits VERY LIMITED NECK ROM   COLONOSCOPY     CYSTOSCOPY W/ RETROGRADES Bilateral 09/22/2016   Procedure: CYSTOSCOPY WITH RETROGRADE PYELOGRAM;  Surgeon: Alexis Frock, MD;  Location: [facility];  Service: Urology;  Laterality: Bilateral;   D & C HYSTEROSCOPY W/ RESECTION POLYP  07/11/2000   DILATION AND CURETTAGE OF UTERUS  1960's   EXCISION CYST AND DEBRIDEMENT RIGHT WIRST AND REMOVAL FORGEIGN BODY  01/09/2009   FLEXOR TENDON REPAIR Left 12/12/2018   Procedure: REPAIR/TRANSFER FLEXOR DIGITORUM PROFUNDUS OF LEFT SMALL FINGER;  Surgeon: Daryll Brod, MD;  Location: [place];  Service: Orthopedics;  Laterality: Left;   LAPAROSCOPY  yrs ago   infertility    METACARPOPHALANGEAL JOINT ARTHRODESIS Right 05/25/1996   TENDON REPAIR Left 01/15/2019   Hand   TONSILLECTOMY AND ADENOIDECTOMY  child   TOTAL  HIP ARTHROPLASTY Left 07/07/2016   Procedure: LEFT TOTAL HIP ARTHROPLASTY ANTERIOR APPROACH;  Surgeon: Gaynelle Arabian, MD;  [facility];  Service: Orthopedics;  Laterality: Left;   TOTAL HIP ARTHROPLASTY Right 09/28/2017   Procedure: RIGHT TOTAL HIP ARTHROPLASTY ANTERIOR APPROACH;  Surgeon: Gaynelle Arabian, MD;  [facility];  Service: Orthopedics;  Laterality: Right;   TOTAL KNEE ARTHROPLASTY Right 07/30/2019   Procedure: TOTAL KNEE ARTHROPLASTY;  Surgeon: Gaynelle Arabian, MD;  [facility];  Service: Orthopedics;  Laterality: Right;  87mn   TRANSURETHRAL RESECTION OF BLADDER TUMOR N/A 09/22/2016   Procedure: TRANSURETHRAL RESECTION OF BLADDER TUMOR (TURBT);  Surgeon: TAlexis Frock MD;  [facility]  Service: Urology;  Laterality: N/A;    MEDICATIONS:  amLODipine (NORVASC) 5 MG tablet   Apoaequorin (PREVAGEN PO)   carbidopa-levodopa (SINEMET  IR) 25-100 MG per tablet   Carbidopa-Levodopa ER (SINEMET CR) 25-100 MG tablet controlled release   cholecalciferol (VITAMIN D3) 25 MCG (1000 UNIT) tablet   Coenzyme Q10 (COQ10 PO)   diazepam (VALIUM) 5 MG tablet   DIGESTIVE ENZYMES PO   EPINEPHrine 0.3 mg/0.3 mL IJ SOAJ injection   estradiol (ESTRACE) 0.5 MG tablet   fluticasone (CUTIVATE) 0.05 % cream   golimumab (SIMPONI ARIA) 50 MG/4ML SOLN injection   hydrochlorothiazide (HYDRODIURIL) 25 MG tablet   medroxyPROGESTERone (PROVERA) 2.5 MG tablet   melatonin 5 MG TABS   meloxicam (MOBIC) 15 MG tablet   Multiple Vitamin (MULTIVITAMIN WITH MINERALS) TABS tablet   Polyethyl Glycol-Propyl Glycol (SYSTANE) 0.4-0.3 % SOLN   Probiotic Product (PROBIOTIC PO)   No current facility-administered medications for this encounter.   Konrad Felix Ward, PA-C [facility] [phone]

## 2022-10-12 ENCOUNTER — Other Ambulatory Visit (INDEPENDENT_AMBULATORY_CARE_PROVIDER_SITE_OTHER): Payer: Medicare Other

## 2022-10-12 DIAGNOSIS — I10 Essential (primary) hypertension: Secondary | ICD-10-CM

## 2022-10-12 LAB — COMPREHENSIVE METABOLIC PANEL
ALT: 6 U/L (ref 0–35)
AST: 22 U/L (ref 0–37)
Albumin: 4.5 g/dL (ref 3.5–5.2)
Alkaline Phosphatase: 36 U/L — ABNORMAL LOW (ref 39–117)
BUN: 22 mg/dL (ref 6–23)
CO2: 30 mEq/L (ref 19–32)
Calcium: 9.3 mg/dL (ref 8.4–10.5)
Chloride: 94 mEq/L — ABNORMAL LOW (ref 96–112)
Creatinine, Ser: 0.78 mg/dL (ref 0.40–1.20)
GFR: 67.66 mL/min (ref >60.00)
Glucose, Bld: 103 mg/dL — ABNORMAL HIGH (ref 70–99)
Potassium: 3.6 mEq/L (ref 3.5–5.1)
Sodium: 132 mEq/L — ABNORMAL LOW (ref 135–145)
Total Bilirubin: 0.8 mg/dL (ref 0.2–1.2)
Total Protein: 6.2 g/dL (ref 6.0–8.3)

## 2022-10-13 ENCOUNTER — Encounter: Payer: Self-pay | Admitting: Pulmonary Disease

## 2022-10-13 ENCOUNTER — Other Ambulatory Visit: Payer: Self-pay | Admitting: Pulmonary Disease

## 2022-10-13 ENCOUNTER — Ambulatory Visit (INDEPENDENT_AMBULATORY_CARE_PROVIDER_SITE_OTHER): Payer: Medicare Other | Admitting: Pulmonary Disease

## 2022-10-13 VITALS — BP 122/80 | HR 112 | Ht 62.0 in | Wt 115.8 lb

## 2022-10-13 DIAGNOSIS — R0609 Other forms of dyspnea: Secondary | ICD-10-CM | POA: Diagnosis not present

## 2022-10-13 MED ORDER — STIOLTO RESPIMAT 2.5-2.5 MCG/ACT IN AERS: 2.0000 | INHALATION_SPRAY | Freq: Every day | RESPIRATORY_TRACT | 3 refills | Status: DC

## 2022-10-13 NOTE — Progress Notes (Signed)
$'@Patient'B$  ID: Stephanie [name], female    DOB: 25-Aug-1932, 88 y.o.   MRN: [id number]  Chief Complaint  Patient presents with  . Consult    Pt is here for consult for DOE. Physical therapist had her walk a little bit for her before surgery. After walking her heart rate was 133 and her oxygen was in the 90s. She states she gets short of breath when walking. Pt states that this has been occurring for 3-4 months. Chest xray 08/24/22 and echo done 08/11/22. No inhalers noted from patient. No issues noted when resting just noted when she is up exerting herself. Hx of Parkinson disease     Referring provider: Sueanne Margarita, MD  HPI:   88 y.o.  PMH:  Smoker/ Smoking History:  Maintenance:   Pt of:   10/13/2022  - Visit     Questionaires / Pulmonary Flowsheets:   ACT:      No data to display          MMRC:     No data to display          Epworth:      No data to display          Tests:   FENO:  No results found for: "NITRICOXIDE"  PFT:     No data to display          WALK:      No data to display          Imaging: No results found.  Lab Results:  CBC    Component Value Date/Time   WBC 7.2 10/01/2022 1259   RBC 4.26 10/01/2022 1259   HGB 13.2 10/01/2022 1259   HGB 14.7 07/24/2010 1433   HCT 39.6 10/01/2022 1259   HCT 43.1 07/24/2010 1433   PLT 202 10/01/2022 1259   PLT 189 07/24/2010 1433   MCV 93.0 10/01/2022 1259   MCV 90.4 07/24/2010 1433   MCH 31.0 10/01/2022 1259   MCHC 33.3 10/01/2022 1259   RDW 12.3 10/01/2022 1259   RDW 12.7 07/24/2010 1433   LYMPHSABS 1,282 09/02/2020 1600   LYMPHSABS 0.9 07/24/2010 1433   MONOABS 0.5 07/19/2016 2231   MONOABS 0.4 07/24/2010 1433   EOSABS 110 09/02/2020 1600   EOSABS 0.1 07/24/2010 1433   BASOSABS 22 09/02/2020 1600   BASOSABS 0.0 07/24/2010 1433    BMET    Component Value Date/Time   NA 132 (L) 10/12/2022 1306   NA 140 05/30/2020 0000   K 3.6 10/12/2022 1306   CL 94 (L)  10/12/2022 1306   CO2 30 10/12/2022 1306   GLUCOSE 103 (H) 10/12/2022 1306   BUN 22 10/12/2022 1306   BUN 25 (A) 05/30/2020 0000   CREATININE 0.78 10/12/2022 1306   CREATININE 0.79 09/02/2020 1600   CALCIUM 9.3 10/12/2022 1306   GFRNONAA 67 05/30/2020 0000   GFRAA >60 07/31/2019 0307    BNP No results found for: "BNP"  ProBNP    Component Value Date/Time   PROBNP 284 10/05/2022 1143    Specialty Problems       Pulmonary Problems   Allergic rhinitis   SOB (shortness of breath)    Allergies  Allergen Reactions  . Cleocin [Clindamycin] Diarrhea  . Codeine Nausea And Vomiting  . Hornet Venom   . Penicillins Itching and Swelling    Has patient had a PCN reaction causing immediate rash, facial/tongue/throat swelling, SOB or lightheadedness with hypotension: Yes Has patient had a PCN  reaction causing severe rash involving mucus membranes or skin necrosis: No Has patient had a PCN reaction that required hospitalization: No Has patient had a PCN reaction occurring within the last 10 years: No If all of the above answers are "NO", then may proceed with Cephalosporin use.   . Vibramycin [Doxycycline] Diarrhea  . Yellow Jacket Venom Swelling  . Lodine [Etodolac] Rash  . Tramadol Itching and Rash    Immunization History  Administered Date(s) Administered  . Fluad Quad(high Dose 65+) 07/30/2022  . Influenza Split 06/29/2019  . Influenza Whole 08/15/2012  . Influenza, High Dose Seasonal PF 08/19/2015, 08/27/2017, 08/15/2018, 07/12/2020, 07/29/2021  . Influenza,inj,quad, With Preservative 08/15/2017, 08/15/2018  . Influenza-Unspecified 08/15/2014, 08/24/2016, 08/15/2018  . PFIZER Comirnaty(Gray Top)Covid-19 Tri-Sucrose Vaccine 05/15/2021  . PFIZER(Purple Top)SARS-COV-2 Vaccination 11/25/2019, 12/16/2019, 08/19/2020  . Pension scheme manager 41yr & up 10/14/2021  . Pneumococcal Conjugate PCV 7 11/18/2014  . Pneumococcal Conjugate-13 12/11/2014  .  Pneumococcal Polysaccharide-23 11/24/2011  . Pneumococcal-Unspecified 11/18/2014  . Td 02/25/2014  . Unspecified SARS-COV-2 Vaccination 08/06/2022  . Zoster Recombinat (Shingrix) 01/20/2018, 04/07/2018  . Zoster, Live 08/27/2011  . Zoster, Unspecified 04/07/2018    Past Medical History:  Diagnosis Date  . Allergy   . Anemia    past hx   . Arthropathy of cervical spine 11/27/2012   [facility]  AP, lateral, and lateral flexion and extension views of the cervical spine are submitted for evaluation.   No acute fracture identified in the cervical spine.   There is redemonstration of approximately 2 mm of C3 on C4 anterolisthesis in neutral which slightly increases in flexion relative to neutral and extension. Slight C5 on C6 retrolisthesis does not change i  . Benign hypertension without congestive heart failure   . Benign mole    right hcets mole, bleeds when dried with towel  . Bilateral dry eyes   . Bladder [place] 112-27-17 . Cervical spondylosis   . Cervicalgia    2 screws in place   . Depression with anxiety 112-27-17  prior to husbands death   . Dyspnea   . Gross hematuria    developed after first hip replacment, led to indicental finding of bladder tumor, bleeding now resolved   . History of kidney stones    1970's  . Left hand weakness    due to reinjury of flxor tendon s/p tendon surgery march 12-2018  . Lumbar stenosis   . Malignant [place] of [place] bladder [place] 07[place]   Pre-stage I  . OA (osteoarthritis)   . Parkinson disease dx 2012   [facility]     [facility]ud[facility]   . Rupture of flexor tendon of hand 2020   right    Tobacco History: Social History   Tobacco Use  Smoking Status Former  . Years: 10.00  . Types: Cigarettes  . Quit date: 05/04/1964  . Years since quitting: 58.4  Smokeless Tobacco Never  Tobacco Comments   Pt states that when she was smoking 1  pack of cigs would last her 3 days. 10/13/22 ALS    Counseling given: Not Answered Tobacco comments: Pt states that when she was smoking 1 pack of cigs would last her 3 days. 10/13/22 ALS    Continue to not smoke  Outpatient Encounter Medications as of 10/13/2022  Medication Sig  . amLODipine (NORVASC) 5 MG tablet TAKE 1  TABLET (5 MG TOTAL) BY MOUTH DAILY.  Marland Kitchen Apoaequorin (PREVAGEN PO) Take 1 tablet by mouth in the morning.  . carbidopa-levodopa (SINEMET IR) 25-100 MG per tablet Take 0.5-1 tablets by mouth See admin instructions. Take 1 tablet by mouth in the morning upon waking & take 0.5 tablet by mouth every 2 hours thereafter for a total of 6 tablets daily.  . Carbidopa-Levodopa ER (SINEMET CR) 25-100 MG tablet controlled release Take 1 tablet by mouth at bedtime.  . cholecalciferol (VITAMIN D3) 25 MCG (1000 UNIT) tablet Take 1,000 Units by mouth in the morning.  . Coenzyme Q10 (COQ10 PO) Take 1 tablet by mouth in the morning.  . diazepam (VALIUM) 5 MG tablet TAKE 1 TABLET (5 MG TOTAL) BY MOUTH EVERY 12 (TWELVE) HOURS AS NEEDED. FOR ANXIETY  . DIGESTIVE ENZYMES PO Take 1 capsule by mouth 3 (three) times daily before meals. Active Enzymes by Delynn Flavin  . EPINEPHrine 0.3 mg/0.3 mL IJ SOAJ injection Inject 0.3 mg into the muscle as needed for anaphylaxis.  Marland Kitchen estradiol (ESTRACE) 0.5 MG tablet TAKE 1/2 OF A TABLET (0.25 MG TOTAL) BY MOUTH EVERY DAY  . fluticasone (CUTIVATE) 0.05 % cream Apply 1 application topically daily as needed (scalp psoriasis).  Marland Kitchen golimumab (SIMPONI ARIA) 50 MG/4ML SOLN injection Inject 50 mg into the vein See admin instructions. Given every 2 months - Last given 06/28/2019  . hydrochlorothiazide (HYDRODIURIL) 25 MG tablet Take 1 tablet (25 mg total) by mouth daily.  . medroxyPROGESTERone (PROVERA) 2.5 MG tablet TAKE 0.5 TABLETS BY MOUTH DAILY.  . melatonin 5 MG TABS Take 5 mg by mouth at bedtime.  . meloxicam (MOBIC) 15 MG tablet Take 15 mg by mouth daily as needed  (joint pain (max 3-4 times/weekly)).  . Multiple Vitamin (MULTIVITAMIN WITH MINERALS) TABS tablet Take 1 tablet by mouth in the morning. Centrum Silver  . Polyethyl Glycol-Propyl Glycol (SYSTANE) 0.4-0.3 % SOLN Place 1-2 drops into both eyes 3 (three) times daily as needed (dry/irritated eyes.).  Marland Kitchen Probiotic Product (PROBIOTIC PO) Take 1 capsule by mouth in the morning and at bedtime. Restore Ultimate Probiotic  . [DISCONTINUED] Tiotropium Bromide-Olodaterol (STIOLTO RESPIMAT) 2.5-2.5 MCG/ACT AERS Inhale 2 puffs into the lungs daily.   No facility-administered encounter medications on file as of 10/13/2022.     Review of Systems  Review of Systems   Physical Exam  BP 122/80 (BP Location: Left Arm, Patient Position: Sitting, Cuff Size: Normal)   Pulse (!) 112   Ht '5\' 2"'$  (1.575 m)   Wt 115 lb 12.8 oz (52.5 kg)   LMP 01/13/2013 Comment: spotting-had benign endometrial biopsy   SpO2 96%   BMI 21.18 kg/m   Wt Readings from Last 5 Encounters:  10/13/22 115 lb 12.8 oz (52.5 kg)  10/05/22 116 lb 3.2 oz (52.7 kg)  10/01/22 116 lb (52.6 kg)  08/27/22 119 lb (54 kg)  08/24/22 119 lb (54 kg)    BMI Readings from Last 5 Encounters:  10/13/22 21.18 kg/m  10/05/22 21.25 kg/m  10/01/22 21.22 kg/m  08/27/22 21.77 kg/m  08/24/22 21.77 kg/m     Physical Exam    Assessment & Plan:   No problem-specific Assessment & Plan notes found for this encounter.    Return in about 2 months (around 12/13/2022).   Lanier Clam, MD 10/13/2022   This appointment required *** minutes of patient care (this includes precharting, chart review, review of results, face-to-face care, etc.).

## 2022-10-13 NOTE — Patient Instructions (Signed)
Nice to meet you  Your chest x-ray appears hyperinflated.  For this reason, I recommend using Stiolto.  2 puffs once a day.  See if this helps with her shortness of breath.  We will get pulmonary function test for further investigation and to try to arrive the diagnosis as to why the lungs may be contributing.  You can pick up the inhaler and start this after your shoulder surgery.  Return to clinic in 2 months or sooner as needed with Dr. Silas Flood

## 2022-10-13 NOTE — Telephone Encounter (Signed)
Anoro 1 puff daily, quantity 60, refills 3

## 2022-10-13 NOTE — Anesthesia Preprocedure Evaluation (Signed)
Anesthesia Evaluation  Patient identified by MRN, date of birth, ID band Patient awake    Reviewed: Allergy & Precautions, NPO status , Patient's Chart, lab work & pertinent test results  Airway Mallampati: II  TM Distance: >3 FB Neck ROM: Full    Dental no notable dental hx. (+) Teeth Intact   Pulmonary former smoker   Pulmonary exam normal breath sounds clear to auscultation       Cardiovascular hypertension, Pt. on medications Normal cardiovascular exam Rhythm:Regular Rate:Normal     Neuro/Psych  PSYCHIATRIC DISORDERS Anxiety Depression    Parkinson's disease  Neuromuscular disease    GI/Hepatic negative GI ROS, Neg liver ROS,,,  Endo/Other  Hyperlipidemia  Renal/GU negative Renal ROS   Hx/o Bladder Ca    Musculoskeletal  (+) Arthritis , Osteoarthritis,    Abdominal   Peds  Hematology  (+) Blood dyscrasia, anemia   Anesthesia Other Findings   Reproductive/Obstetrics                             Anesthesia Physical Anesthesia Plan  ASA: III  Anesthesia Plan: General and Regional   Post-op Pain Management:  Regional for Post-op pain and Regional block* and Minimal or no pain anticipated   Induction: Intravenous  PONV Risk Score and Plan: 3 and Ondansetron, Treatment may vary due to age or medical condition, Dexamethasone and Midazolam  Airway Management Planned: Oral ETT  Additional Equipment:   Intra-op Plan:   Post-operative Plan: Extubation in OR  Informed Consent: I have reviewed the patients History and Physical, chart, labs and discussed the procedure including the risks, benefits and alternatives for the proposed anesthesia with the patient or authorized representative who has indicated his/her understanding and acceptance.       Plan Discussed with: CRNA and Surgeon  Anesthesia Plan Comments: (See PAT note 10/01/2022)        Anesthesia Quick  Evaluation

## 2022-10-14 ENCOUNTER — Encounter (HOSPITAL_COMMUNITY): Payer: Self-pay | Admitting: Orthopedic Surgery

## 2022-10-14 ENCOUNTER — Encounter (HOSPITAL_COMMUNITY)
Admission: RE | Disposition: A | Discharge status: Home / Self Care | Payer: Self-pay | Source: Ambulatory Visit | Attending: Orthopedic Surgery

## 2022-10-14 ENCOUNTER — Other Ambulatory Visit: Payer: Self-pay

## 2022-10-14 ENCOUNTER — Ambulatory Visit (HOSPITAL_COMMUNITY): Payer: Medicare Other | Admitting: Physician Assistant

## 2022-10-14 ENCOUNTER — Ambulatory Visit (HOSPITAL_COMMUNITY)
Admission: RE | Admit: 2022-10-14 | Discharge: 2022-10-15 | Disposition: A | Discharge status: Home / Self Care | Payer: Medicare Other | Source: Ambulatory Visit | Attending: Orthopedic Surgery | Admitting: Orthopedic Surgery

## 2022-10-14 ENCOUNTER — Ambulatory Visit (HOSPITAL_BASED_OUTPATIENT_CLINIC_OR_DEPARTMENT_OTHER): Payer: Medicare Other | Admitting: Physician Assistant

## 2022-10-14 DIAGNOSIS — I10 Essential (primary) hypertension: Secondary | ICD-10-CM | POA: Diagnosis not present

## 2022-10-14 DIAGNOSIS — G8918 Other acute postprocedural pain: Secondary | ICD-10-CM | POA: Diagnosis not present

## 2022-10-14 DIAGNOSIS — Z79899 Other long term (current) drug therapy: Secondary | ICD-10-CM | POA: Diagnosis not present

## 2022-10-14 DIAGNOSIS — M12812 Other specific arthropathies, not elsewhere classified, left shoulder: Secondary | ICD-10-CM

## 2022-10-14 DIAGNOSIS — E785 Hyperlipidemia, unspecified: Secondary | ICD-10-CM | POA: Diagnosis not present

## 2022-10-14 DIAGNOSIS — F419 Anxiety disorder, unspecified: Secondary | ICD-10-CM | POA: Insufficient documentation

## 2022-10-14 DIAGNOSIS — G20A1 Parkinson's disease without dyskinesia, without mention of fluctuations: Secondary | ICD-10-CM | POA: Insufficient documentation

## 2022-10-14 DIAGNOSIS — Z8551 Personal history of malignant neoplasm of bladder: Secondary | ICD-10-CM | POA: Insufficient documentation

## 2022-10-14 DIAGNOSIS — M19012 Primary osteoarthritis, left shoulder: Secondary | ICD-10-CM | POA: Insufficient documentation

## 2022-10-14 DIAGNOSIS — Z87891 Personal history of nicotine dependence: Secondary | ICD-10-CM | POA: Diagnosis not present

## 2022-10-14 DIAGNOSIS — L4050 Arthropathic psoriasis, unspecified: Secondary | ICD-10-CM | POA: Insufficient documentation

## 2022-10-14 DIAGNOSIS — Z96612 Presence of left artificial shoulder joint: Secondary | ICD-10-CM | POA: Diagnosis present

## 2022-10-14 DIAGNOSIS — F32A Depression, unspecified: Secondary | ICD-10-CM | POA: Diagnosis not present

## 2022-10-14 HISTORY — PX: REVERSE SHOULDER ARTHROPLASTY: SHX5054

## 2022-10-14 SURGERY — ARTHROPLASTY, SHOULDER, TOTAL, REVERSE
Anesthesia: Regional | Site: Shoulder | Laterality: Left

## 2022-10-14 MED ORDER — POLYETHYLENE GLYCOL 3350 17 G PO PACK: 17.0000 g | PACK | Freq: Every day | ORAL | Status: DC | PRN

## 2022-10-14 MED ORDER — CARBIDOPA-LEVODOPA 25-100 MG PO TABS
1.0000 | ORAL_TABLET | Freq: Every day | ORAL | Status: DC
  Administered 2022-10-15: 1 via ORAL
  Filled 2022-10-14: qty 1

## 2022-10-14 MED ORDER — SUGAMMADEX SODIUM 200 MG/2ML IV SOLN
INTRAVENOUS | Status: DC | PRN
  Administered 2022-10-14: 120 mg via INTRAVENOUS

## 2022-10-14 MED ORDER — PHENYLEPHRINE HCL-NACL 20-0.9 MG/250ML-% IV SOLN
INTRAVENOUS | Status: DC | PRN
  Administered 2022-10-14: 20 ug/min via INTRAVENOUS

## 2022-10-14 MED ORDER — METOCLOPRAMIDE HCL 5 MG/ML IJ SOLN: 5.0000 mg | Freq: Three times a day (TID) | INTRAMUSCULAR | Status: DC | PRN

## 2022-10-14 MED ORDER — STERILE WATER FOR IRRIGATION IR SOLN
Status: DC | PRN
  Administered 2022-10-14: 2000 mL

## 2022-10-14 MED ORDER — MELATONIN 5 MG PO TABS
5.0000 mg | ORAL_TABLET | Freq: Every day | ORAL | Status: DC
  Administered 2022-10-14: 5 mg via ORAL
  Filled 2022-10-14: qty 1

## 2022-10-14 MED ORDER — VANCOMYCIN HCL 1000 MG IV SOLR
INTRAVENOUS | Status: DC | PRN
  Administered 2022-10-14: 1 g via TOPICAL

## 2022-10-14 MED ORDER — CHLORHEXIDINE GLUCONATE 0.12 % MT SOLN
15.0000 mL | Freq: Once | OROMUCOSAL | Status: AC
  Administered 2022-10-14: 15 mL via OROMUCOSAL

## 2022-10-14 MED ORDER — METHOCARBAMOL 500 MG PO TABS: 500.0000 mg | ORAL_TABLET | Freq: Four times a day (QID) | ORAL | Status: DC | PRN

## 2022-10-14 MED ORDER — HYDROMORPHONE HCL 1 MG/ML IJ SOLN: 0.2500 mg | INTRAMUSCULAR | Status: DC | PRN

## 2022-10-14 MED ORDER — LIDOCAINE HCL (PF) 2 % IJ SOLN
INTRAMUSCULAR | Status: AC
  Filled 2022-10-14: qty 5

## 2022-10-14 MED ORDER — TRANEXAMIC ACID-NACL 1000-0.7 MG/100ML-% IV SOLN
1000.0000 mg | INTRAVENOUS | Status: AC
  Administered 2022-10-14: 1000 mg via INTRAVENOUS
  Filled 2022-10-14: qty 100

## 2022-10-14 MED ORDER — ONDANSETRON HCL 4 MG/2ML IJ SOLN
INTRAMUSCULAR | Status: AC
  Filled 2022-10-14: qty 2

## 2022-10-14 MED ORDER — OXYCODONE HCL 5 MG PO TABS: 5.0000 mg | ORAL_TABLET | Freq: Once | ORAL | Status: DC | PRN

## 2022-10-14 MED ORDER — PHENOL 1.4 % MT LIQD: 1.0000 | OROMUCOSAL | Status: DC | PRN

## 2022-10-14 MED ORDER — PANTOPRAZOLE SODIUM 40 MG PO TBEC
40.0000 mg | DELAYED_RELEASE_TABLET | Freq: Every day | ORAL | Status: DC
  Administered 2022-10-15: 40 mg via ORAL
  Filled 2022-10-14: qty 1

## 2022-10-14 MED ORDER — LACTATED RINGERS IV SOLN: INTRAVENOUS | Status: DC

## 2022-10-14 MED ORDER — DIPHENHYDRAMINE HCL 12.5 MG/5ML PO ELIX: 12.5000 mg | ORAL_SOLUTION | ORAL | Status: DC | PRN

## 2022-10-14 MED ORDER — CARBIDOPA-LEVODOPA 25-100 MG PO TABS
0.5000 | ORAL_TABLET | ORAL | Status: DC
  Administered 2022-10-14 – 2022-10-15 (×2): 0.5 via ORAL
  Filled 2022-10-14 (×2): qty 1

## 2022-10-14 MED ORDER — HYDROCHLOROTHIAZIDE 25 MG PO TABS
25.0000 mg | ORAL_TABLET | Freq: Every day | ORAL | Status: DC
  Administered 2022-10-15: 25 mg via ORAL
  Filled 2022-10-14: qty 1

## 2022-10-14 MED ORDER — OXYCODONE HCL 5 MG PO TABS: 2.5000 mg | ORAL_TABLET | ORAL | Status: DC | PRN

## 2022-10-14 MED ORDER — ONDANSETRON HCL 4 MG PO TABS: 4.0000 mg | ORAL_TABLET | Freq: Four times a day (QID) | ORAL | Status: DC | PRN

## 2022-10-14 MED ORDER — OXYCODONE HCL 5 MG PO TABS: 5.0000 mg | ORAL_TABLET | ORAL | Status: DC | PRN

## 2022-10-14 MED ORDER — MAGNESIUM CITRATE PO SOLN: 1.0000 | Freq: Once | ORAL | Status: DC | PRN

## 2022-10-14 MED ORDER — BISACODYL 5 MG PO TBEC: 5.0000 mg | DELAYED_RELEASE_TABLET | Freq: Every day | ORAL | Status: DC | PRN

## 2022-10-14 MED ORDER — ROCURONIUM BROMIDE 10 MG/ML (PF) SYRINGE
PREFILLED_SYRINGE | INTRAVENOUS | Status: AC
  Filled 2022-10-14: qty 10

## 2022-10-14 MED ORDER — PROPOFOL 10 MG/ML IV BOLUS
INTRAVENOUS | Status: DC | PRN
  Administered 2022-10-14: 120 mg via INTRAVENOUS

## 2022-10-14 MED ORDER — FENTANYL CITRATE PF 50 MCG/ML IJ SOSY
100.0000 ug | PREFILLED_SYRINGE | INTRAMUSCULAR | Status: DC
  Administered 2022-10-14: 50 ug via INTRAVENOUS
  Filled 2022-10-14: qty 2

## 2022-10-14 MED ORDER — ONDANSETRON HCL 4 MG/2ML IJ SOLN
INTRAMUSCULAR | Status: DC | PRN
  Administered 2022-10-14: 4 mg via INTRAVENOUS

## 2022-10-14 MED ORDER — OXYCODONE HCL 5 MG/5ML PO SOLN: 5.0000 mg | Freq: Once | ORAL | Status: DC | PRN

## 2022-10-14 MED ORDER — PROMETHAZINE HCL 25 MG RE SUPP: 25.0000 mg | Freq: Once | RECTAL | Status: DC | PRN

## 2022-10-14 MED ORDER — PROPOFOL 10 MG/ML IV BOLUS
INTRAVENOUS | Status: AC
  Filled 2022-10-14: qty 20

## 2022-10-14 MED ORDER — ALUM & MAG HYDROXIDE-SIMETH 200-200-20 MG/5ML PO SUSP: 30.0000 mL | ORAL | Status: DC | PRN

## 2022-10-14 MED ORDER — DIAZEPAM 5 MG PO TABS: 5.0000 mg | ORAL_TABLET | Freq: Two times a day (BID) | ORAL | Status: DC | PRN

## 2022-10-14 MED ORDER — BUPIVACAINE LIPOSOME 1.3 % IJ SUSP
INTRAMUSCULAR | Status: DC | PRN
  Administered 2022-10-14: 10 mL via PERINEURAL

## 2022-10-14 MED ORDER — 0.9 % SODIUM CHLORIDE (POUR BTL) OPTIME
TOPICAL | Status: DC | PRN
  Administered 2022-10-14: 1000 mL

## 2022-10-14 MED ORDER — ROCURONIUM BROMIDE 10 MG/ML (PF) SYRINGE
PREFILLED_SYRINGE | INTRAVENOUS | Status: DC | PRN
  Administered 2022-10-14: 10 mg via INTRAVENOUS
  Administered 2022-10-14: 50 mg via INTRAVENOUS

## 2022-10-14 MED ORDER — DOCUSATE SODIUM 100 MG PO CAPS
100.0000 mg | ORAL_CAPSULE | Freq: Two times a day (BID) | ORAL | Status: DC
  Administered 2022-10-14 – 2022-10-15 (×2): 100 mg via ORAL
  Filled 2022-10-14 (×2): qty 1

## 2022-10-14 MED ORDER — LIDOCAINE 2% (20 MG/ML) 5 ML SYRINGE
INTRAMUSCULAR | Status: DC | PRN
  Administered 2022-10-14: 40 mg via INTRAVENOUS

## 2022-10-14 MED ORDER — DEXAMETHASONE SODIUM PHOSPHATE 10 MG/ML IJ SOLN
INTRAMUSCULAR | Status: DC | PRN
  Administered 2022-10-14: 4 mg via INTRAVENOUS

## 2022-10-14 MED ORDER — METOCLOPRAMIDE HCL 5 MG PO TABS: 5.0000 mg | ORAL_TABLET | Freq: Three times a day (TID) | ORAL | Status: DC | PRN

## 2022-10-14 MED ORDER — CARBIDOPA-LEVODOPA ER 25-100 MG PO TBCR
1.0000 | EXTENDED_RELEASE_TABLET | Freq: Every day | ORAL | Status: DC
  Administered 2022-10-14: 1 via ORAL
  Filled 2022-10-14: qty 1

## 2022-10-14 MED ORDER — TRANEXAMIC ACID 1000 MG/10ML IV SOLN: 1000.0000 mg | INTRAVENOUS | Status: DC

## 2022-10-14 MED ORDER — ORAL CARE MOUTH RINSE: 15.0000 mL | Freq: Once | OROMUCOSAL | Status: AC

## 2022-10-14 MED ORDER — ACETAMINOPHEN 325 MG PO TABS: 325.0000 mg | ORAL_TABLET | Freq: Four times a day (QID) | ORAL | Status: DC | PRN

## 2022-10-14 MED ORDER — CEFAZOLIN SODIUM-DEXTROSE 2-4 GM/100ML-% IV SOLN
2.0000 g | INTRAVENOUS | Status: AC
Start: 2022-10-14 — End: 2022-10-14
  Administered 2022-10-14: 2 g via INTRAVENOUS
  Filled 2022-10-14: qty 100

## 2022-10-14 MED ORDER — VANCOMYCIN HCL 1000 MG IV SOLR
INTRAVENOUS | Status: AC
  Filled 2022-10-14: qty 20

## 2022-10-14 MED ORDER — BUPIVACAINE HCL (PF) 0.5 % IJ SOLN
INTRAMUSCULAR | Status: DC | PRN
  Administered 2022-10-14: 20 mL via PERINEURAL

## 2022-10-14 MED ORDER — METHOCARBAMOL 500 MG IVPB - SIMPLE MED: 500.0000 mg | Freq: Four times a day (QID) | INTRAVENOUS | Status: DC | PRN

## 2022-10-14 MED ORDER — DEXAMETHASONE SODIUM PHOSPHATE 10 MG/ML IJ SOLN
INTRAMUSCULAR | Status: AC
  Filled 2022-10-14: qty 1

## 2022-10-14 MED ORDER — ONDANSETRON HCL 4 MG/2ML IJ SOLN: 4.0000 mg | Freq: Four times a day (QID) | INTRAMUSCULAR | Status: DC | PRN

## 2022-10-14 MED ORDER — MIDAZOLAM HCL 2 MG/2ML IJ SOLN
2.0000 mg | INTRAMUSCULAR | Status: DC
  Administered 2022-10-14: 2 mg via INTRAVENOUS
  Filled 2022-10-14: qty 2

## 2022-10-14 MED ORDER — AMLODIPINE BESYLATE 5 MG PO TABS
5.0000 mg | ORAL_TABLET | Freq: Every day | ORAL | Status: DC
  Administered 2022-10-14 – 2022-10-15 (×2): 5 mg via ORAL
  Filled 2022-10-14 (×2): qty 1

## 2022-10-14 MED ORDER — HYDROMORPHONE HCL 1 MG/ML IJ SOLN: 0.5000 mg | INTRAMUSCULAR | Status: DC | PRN

## 2022-10-14 MED ORDER — MELOXICAM 15 MG PO TABS: 15.0000 mg | ORAL_TABLET | Freq: Every day | ORAL | Status: DC | PRN

## 2022-10-14 MED ORDER — MENTHOL 3 MG MT LOZG: 1.0000 | LOZENGE | OROMUCOSAL | Status: DC | PRN

## 2022-10-14 SURGICAL SUPPLY — 70 items
ADH SKN CLS APL DERMABOND .7 (GAUZE/BANDAGES/DRESSINGS) ×1
AID PSTN UNV HD RSTRNT DISP (MISCELLANEOUS) ×1
BAG COUNTER SPONGE SURGICOUNT (BAG) IMPLANT
BAG SPEC THK2 15X12 ZIP CLS (MISCELLANEOUS) ×1
BAG SPNG CNTER NS LX DISP (BAG) ×1
BAG ZIPLOCK 12X15 (MISCELLANEOUS) ×2 IMPLANT
BLADE SAW SGTL 83.5X18.5 (BLADE) ×2 IMPLANT
BNDG CMPR 5X4 CHSV STRCH STRL (GAUZE/BANDAGES/DRESSINGS) ×1
BNDG COHESIVE 4X5 TAN STRL LF (GAUZE/BANDAGES/DRESSINGS) ×2 IMPLANT
BSPLAT GLND +2X24 MDLR (Joint) ×1 IMPLANT
COOLER ICEMAN CLASSIC (MISCELLANEOUS) ×2 IMPLANT
COVER BACK TABLE 60X90IN (DRAPES) ×2 IMPLANT
COVER SURGICAL LIGHT HANDLE (MISCELLANEOUS) ×2 IMPLANT
CUP SUT UNIV REVERS 36 NEUTRAL (Cup) IMPLANT
DERMABOND ADVANCED .7 DNX12 (GAUZE/BANDAGES/DRESSINGS) ×2 IMPLANT
DRAPE INCISE IOBAN 66X45 STRL (DRAPES) IMPLANT
DRAPE ORTHO SPLIT 77X108 STRL (DRAPES) ×2
DRAPE SHEET LG 3/4 BI-LAMINATE (DRAPES) ×2 IMPLANT
DRAPE SURG 17X11 SM STRL (DRAPES) ×2 IMPLANT
DRAPE SURG ORHT 6 SPLT 77X108 (DRAPES) ×4 IMPLANT
DRAPE TOP 10253 STERILE (DRAPES) ×2 IMPLANT
DRAPE U-SHAPE 47X51 STRL (DRAPES) ×2 IMPLANT
DRESSING AQUACEL AG SP 3.5X6 (GAUZE/BANDAGES/DRESSINGS) ×2 IMPLANT
DRSG AQUACEL AG SP 3.5X6 (GAUZE/BANDAGES/DRESSINGS) ×1
DURAPREP 26ML APPLICATOR (WOUND CARE) ×2 IMPLANT
ELECT BLADE TIP CTD 4 INCH (ELECTRODE) ×2 IMPLANT
ELECT PENCIL ROCKER SW 15FT (MISCELLANEOUS) ×2 IMPLANT
ELECT REM PT RETURN 15FT ADLT (MISCELLANEOUS) ×2 IMPLANT
FACESHIELD WRAPAROUND (MASK) ×5 IMPLANT
FACESHIELD WRAPAROUND OR TEAM (MASK) ×10 IMPLANT
GLENOID UNI REV MOD 24 +2 LAT (Joint) IMPLANT
GLENOSPHERE 36 +4 LAT/24 (Joint) IMPLANT
GLOVE BIO SURGEON STRL SZ7.5 (GLOVE) ×2 IMPLANT
GLOVE BIO SURGEON STRL SZ8 (GLOVE) ×2 IMPLANT
GLOVE SS BIOGEL STRL SZ 7 (GLOVE) ×2 IMPLANT
GLOVE SS BIOGEL STRL SZ 7.5 (GLOVE) ×2 IMPLANT
GOWN STRL SURGICAL XL XLNG (GOWN DISPOSABLE) ×4 IMPLANT
INSERT HUMERAL 36 +6 (Shoulder) IMPLANT
KIT BASIN OR (CUSTOM PROCEDURE TRAY) ×2 IMPLANT
KIT TURNOVER KIT A (KITS) IMPLANT
MANIFOLD NEPTUNE II (INSTRUMENTS) ×2 IMPLANT
NDL TAPERED W/ NITINOL LOOP (MISCELLANEOUS) ×2 IMPLANT
NEEDLE TAPERED W/ NITINOL LOOP (MISCELLANEOUS) ×1 IMPLANT
NS IRRIG 1000ML POUR BTL (IV SOLUTION) ×2 IMPLANT
PACK SHOULDER (CUSTOM PROCEDURE TRAY) ×2 IMPLANT
PAD ARMBOARD 7.5X6 YLW CONV (MISCELLANEOUS) ×2 IMPLANT
PAD COLD SHLDR WRAP-ON (PAD) ×2 IMPLANT
PIN SET MODULAR GLENOID SYSTEM (PIN) IMPLANT
RESTRAINT HEAD UNIVERSAL NS (MISCELLANEOUS) ×2 IMPLANT
SCREW CENTRAL MODULAR 25 (Screw) IMPLANT
SCREW PERI LOCK 5.5X16 (Screw) IMPLANT
SCREW PERI LOCK 5.5X36 (Screw) IMPLANT
SCREW PERIPHERAL 5.5X20 LOCK (Screw) IMPLANT
SLING ARM FOAM STRAP MED (SOFTGOODS) IMPLANT
SPONGE T-LAP 4X18 ~~LOC~~+RFID (SPONGE) ×2 IMPLANT
STEM HUMERAL MOD SZ 5 135 DEG (Stem) IMPLANT
SUCTION FRAZIER HANDLE 12FR (TUBING) ×1
SUCTION TUBE FRAZIER 12FR DISP (TUBING) ×2 IMPLANT
SUT FIBERWIRE #2 38 T-5 BLUE (SUTURE) ×2
SUT MNCRL AB 3-0 PS2 18 (SUTURE) ×2 IMPLANT
SUT MON AB 2-0 CT1 36 (SUTURE) ×2 IMPLANT
SUT VIC AB 1 CT1 36 (SUTURE) ×2 IMPLANT
SUTURE FIBERWR #2 38 T-5 BLUE (SUTURE) IMPLANT
SUTURE TAPE 1.3 40 TPR END (SUTURE) ×4 IMPLANT
SUTURETAPE 1.3 40 TPR END (SUTURE) ×2
TOWEL OR 17X26 10 PK STRL BLUE (TOWEL DISPOSABLE) ×2 IMPLANT
TOWEL OR NON WOVEN STRL DISP B (DISPOSABLE) ×2 IMPLANT
TUBE SUCTION HIGH CAP CLEAR NV (SUCTIONS) ×2 IMPLANT
TUBING CONNECTING 10 (TUBING) IMPLANT
WATER STERILE IRR 1000ML POUR (IV SOLUTION) ×4 IMPLANT

## 2022-10-14 NOTE — Transfer of Care (Signed)
Immediate Anesthesia Transfer of Care Note  Patient: Stephanie [name]  Procedure(s) Performed: REVERSE SHOULDER ARTHROPLASTY (Left: Shoulder)  Patient Location: PACU  Anesthesia Type:GA combined with regional for post-op pain  Level of Consciousness: drowsy  Airway & Oxygen Therapy: Patient Spontanous Breathing and Patient connected to face mask oxygen  Post-op Assessment: Report given to RN and Post -op Vital signs reviewed and stable  Post vital signs: Reviewed and stable  Last Vitals:  Vitals Value Taken Time  BP 153/80 10/14/22 1203  Temp    Pulse 72 10/14/22 1204  Resp 17 10/14/22 1204  SpO2 100 % 10/14/22 1204  Vitals shown include unvalidated device data.  Last Pain:  Vitals:   10/14/22 0904  TempSrc: Oral  PainSc: 0-No pain         Complications: No notable events documented.

## 2022-10-14 NOTE — Op Note (Signed)
10/14/2022  11:44 AM  PATIENT:   Stephanie [name]  88 y.o. female  PRE-OPERATIVE DIAGNOSIS:  Left shoulder advanced osteoarthritis and rotator cuff arthropathy  POST-OPERATIVE DIAGNOSIS: Same  PROCEDURE: Left shoulder reverse arthroplasty lysing a press-fit size 5 Arthrex stem with a neutral metaphysis, +6 polyethylene insert, 36/+4 glenosphere and a small/+2 baseplate  SURGEON:  Arlyce Circle, Metta Clines M.D.  ASSISTANTS: Jenetta Loges, PA-C  Jenetta Loges, PA-C was utilized as an Environmental consultant throughout this case, essential for help with positioning the patient, positioning extremity, tissue manipulation, implantation of the prosthesis, suture management, wound closure, and intraoperative decision-making.  ANESTHESIA:   General endotracheal and interscalene block with Exparel  EBL: 100 cc  SPECIMEN: None  Drains: None   PATIENT DISPOSITION:  PACU - hemodynamically stable.    PLAN OF CARE: Admit for overnight observation  Brief history:  Patient is an 88 year old female with chronic and progressively increasing left shoulder pain related to severe osteoarthritis and rotator cuff dysfunction.  Due to her increasing functional limitations and failure to respond to prolonged attempts at conservative management, she is brought to the operating room at this time for planned left shoulder reverse arthroplasty.  Preoperatively, I counseled the patient regarding treatment options and risks versus benefits thereof.  Possible surgical complications were all reviewed including potential for bleeding, infection, neurovascular injury, persistent pain, loss of motion, anesthetic complication, failure of the implant, and possible need for additional surgery. They understand and accept and agrees with our planned procedure.   Procedure in detail:  After undergoing routine preop evaluation the patient received prophylactic antibiotics and interscalene block with Exparel was established in the holding  area by the anesthesia department.  Subsequently placed spine on the operating table and underwent the smooth induction of the general endotracheal anesthesia.  Placed into the beachchair position and appropriately padded and protected.  The left shoulder girdle region was sterilely prepped and draped in standard fashion.  Timeout was called.  A deltopectoral approach to the left shoulder is made an approximately 7 cm incision.  Skin flaps were elevated dissection carried deeply the deltopectoral interval was then developed from proximal to distal with the vein taken laterally.  The conjoined tendon was mobilized and retracted medially.  The long head biceps tendon was then tenodesed at the upper border the pectoralis major tendon with the proximal segment unroofed and excised.  The rotator cuff was split from the apex of the bicipital groove to the base of the coracoid and the subscapularis was then separated from the lesser tuberosity using electrocautery and the free margin was tagged with a pair of FiberWire sutures.  Capsular attachments were then divided from the anterior and inferior margins of the humeral neck and humeral head was then delivered through the wound.  An extra medullary guide was then used to outline the proposed humeral head resection which we performed with an oscillating saw at approximate 20 degrees of retroversion.  Peripheral osteophytes removed with rondure and a metal cap was then placed over the cut proximal humeral surface.  The glenoid was then exposed and a circumferential labral resection was completed.  A guidepin was then directed into the center of the glenoid and the glenoid was then prepared with the central followed by the peripheral reamer achieving a stable subchondral bony bed.  Preparation completed with the central drill and tapped for a 25 mm lag screw.  Our baseplate was assembled with vancomycin powder applied to the threads of the lag screw and was inserted  with  excellent purchase and fixation.  The peripheral locking screws were all then placed using standard technique with excellent fixation.  A 36/+4 glenosphere was then impacted onto the baseplate and a central locking screw was placed.  Our attention was then returned to the humeral metaphysis where the canal was opened hand reaming and then broaching to a standard size 5 implant with excellent purchase and fixation.  A neutral metaphyseal reaming guide was then utilized achieving excellent contouring of the metaphysis.  A trial implant was then placed and trial reduction showed good motion good stability and good soft tissue balance.  This point the trial implant was removed.  A final implant was assembled.  Canal was irrigated cleaned and dried with vancomycin powder applied and the final implant was then impacted with excellent fixation.  A series of trial reduction showed the best soft tissue balance with a +6 poly.  The trial poly was then removed.  The final poly was then impacted.  Final reduction was then performed which again showed excellent motion stability and soft tissue balance all much to our satisfaction.  The joint was then copiously irrigated.  Hemostasis was obtained.  Electrocautery ligation of the cephalic vein was necessary due to compromise.  The subscapularis was then repaired back to the eyelets on the collar the implant in the arm easily achieved 45 degrees of external rotation without excessive tension on the subscap repair.  The deltopectoral interval was then reapproximated with a series of figure-of-eight number Vicryl sutures.  2-0 Monocryl used to close the subcu layer and intracuticular 3-0 Monocryl used to close the skin followed by Dermabond and Aquacel dressing.  Left arm placed into a sling and the patient was awakened, extubated, and taken to the recovery room in stable condition.  Marin Shutter MD   Contact # [phone]

## 2022-10-14 NOTE — Anesthesia Postprocedure Evaluation (Signed)
Anesthesia Post Note  Patient: Stephanie [name]  Procedure(s) Performed: REVERSE SHOULDER ARTHROPLASTY (Left: Shoulder)     Patient location during evaluation: PACU Anesthesia Type: Regional and General Level of consciousness: awake and alert Pain management: pain level controlled Vital Signs Assessment: post-procedure vital signs reviewed and stable Respiratory status: spontaneous breathing, nonlabored ventilation and respiratory function stable Cardiovascular status: blood pressure returned to baseline and stable Postop Assessment: no apparent nausea or vomiting Anesthetic complications: no   No notable events documented.  Last Vitals:  Vitals:   10/14/22 1245 10/14/22 1300  BP: (!) 152/84 (!) 152/91  Pulse: 80 83  Resp: 14 14  Temp:    SpO2: 95% 94%    Last Pain:  Vitals:   10/14/22 1245  TempSrc:   PainSc: 0-No pain                 Lynda Rainwater

## 2022-10-14 NOTE — Discharge Instructions (Signed)

## 2022-10-14 NOTE — Anesthesia Procedure Notes (Signed)
Anesthesia Regional Block: Interscalene brachial plexus block   Pre-Anesthetic Checklist: , timeout performed,  Correct Patient, Correct Site, Correct Laterality,  Correct Procedure, Correct Position, site marked,  Risks and benefits discussed,  Surgical consent,  Pre-op evaluation,  At surgeon's request and post-op pain management  Laterality: Left  Prep: chloraprep       Needles:  Injection technique: Single-shot  Needle Type: Stimiplex     Needle Length: 9cm  Needle Gauge: 21     Additional Needles:   Procedures:,,,, ultrasound used (permanent image in chart),,    Narrative:  Start time: 10/14/2022 9:08 AM End time: 10/14/2022 9:13 AM Injection made incrementally with aspirations every 5 mL.  Performed by: Personally  Anesthesiologist: Lynda Rainwater, MD

## 2022-10-14 NOTE — Anesthesia Procedure Notes (Signed)
Procedure Name: Intubation Date/Time: 10/14/2022 10:28 AM  Performed by: Eben Burow, CRNAPre-anesthesia Checklist: Patient identified, Emergency Drugs available, Suction available, Patient being monitored and Timeout performed Patient Re-evaluated:Patient Re-evaluated prior to induction Oxygen Delivery Method: Circle system utilized Preoxygenation: Pre-oxygenation with 100% oxygen Induction Type: IV induction Ventilation: Mask ventilation without difficulty Laryngoscope Size: Mac and 4 Grade View: Grade I Tube type: Oral Tube size: 7.0 mm Number of attempts: 1 Airway Equipment and Method: Stylet Placement Confirmation: ETT inserted through vocal cords under direct vision, positive ETCO2 and breath sounds checked- equal and bilateral Secured at: 21 cm Tube secured with: Tape Dental Injury: Teeth and Oropharynx as per pre-operative assessment

## 2022-10-14 NOTE — Care Plan (Signed)
Ortho Bundle Case Management Note  Patient Details  Name: Stephanie [name] MRN: [id number] Date of Birth: 1932/01/21                  L Rev TSA on 10-14-22  DCP: Home with dtr  DME: Sling and CTU provided by Dirk Dress  PT: [place].   DME Arranged:  N/A DME Agency:       Additional Comments: Please contact me with any questions of if this plan should need to change.  Marianne Sofia, RN,CCM [facility]  [phone] 10/14/2022, 9:01 AM

## 2022-10-14 NOTE — H&P (Signed)
[name]    Chief Complaint: Left shoulder advanced osteoarthritis and rotator cuff arthropathy HPI: The patient is a 88 y.o. female with chronic and progressively increasing left shoulder pain related to severe osteoarthritis and associated rotator cuff dysfunction.  Due to her increasing functional imitations and failure to respond to prolonged attempts at conservative management, she is brought to the operating room this time for planned left shoulder reverse arthroplasty.  Past Medical History:  Diagnosis Date   Allergy    Anemia    past hx    Arthropathy of cervical spine 11/27/2012   Right  Xray at [facility]  On 04/08/2016  AP, lateral, and lateral flexion and extension views of the cervical spine are submitted for evaluation.   No acute fracture identified in the cervical spine.   There is redemonstration of approximately 2 mm of C3 on C4 anterolisthesis in neutral which slightly increases in flexion relative to neutral and extension. Slight C5 on C6 retrolisthesis does not change i   Benign hypertension without congestive heart failure    Benign mole    right hcets mole, bleeds when dried with towel   Bilateral dry eyes    Bladder cancer ([place]) 10/20/2016   Cervical spondylosis    Cervicalgia    2 screws in place    Depression with anxiety 2016-10-20   prior to husbands death    Dyspnea    Gross hematuria    developed after first hip replacment, led to indicental finding of bladder tumor, bleeding now resolved    History of kidney stones    1970's   Left hand weakness    due to reinjury of flxor tendon s/p tendon surgery march 12-2018   Lumbar stenosis    Malignant tumor of urinary bladder ([place]) 07/2016   Pre-stage I   OA (osteoarthritis)    Parkinson disease dx 2012   neurologist-  dr siddiqui at [place] [facility])     Dr Trudie Reed , [place] [facility]    Rupture of flexor tendon of hand 2020   right      Past Surgical History:  Procedure Laterality Date    BIOPSY MASS LEFT FIRST METACARPAL   06/23/2006   benign   CATARACT EXTRACTION W/ INTRAOCULAR LENS  IMPLANT, BILATERAL  1999 and 2003   CERVICAL FUSION  02/2017   c1-c2 ; reports she has 2 screws 2in long in place done at [facility] ; patient exhibits VERY LIMITED NECK ROM   COLONOSCOPY     CYSTOSCOPY W/ RETROGRADES Bilateral 09/22/2016   Procedure: CYSTOSCOPY WITH RETROGRADE PYELOGRAM;  Surgeon: Alexis Frock, MD;  Location: [facility];  Service: Urology;  Laterality: Bilateral;   D & C HYSTEROSCOPY W/ RESECTION POLYP  07/11/2000   DILATION AND CURETTAGE OF UTERUS  1960's   EXCISION CYST AND DEBRIDEMENT RIGHT WIRST AND REMOVAL FORGEIGN BODY  01/09/2009   FLEXOR TENDON REPAIR Left 12/12/2018   Procedure: REPAIR/TRANSFER FLEXOR DIGITORUM PROFUNDUS OF LEFT SMALL FINGER;  Surgeon: Daryll Brod, MD;  Location: [place];  Service: Orthopedics;  Laterality: Left;   LAPAROSCOPY  yrs ago   infertility    METACARPOPHALANGEAL JOINT ARTHRODESIS Right 05/25/1996   TENDON REPAIR Left 01/15/2019   Hand   TONSILLECTOMY AND ADENOIDECTOMY  child   TOTAL HIP ARTHROPLASTY Left 07/07/2016   Procedure: LEFT TOTAL HIP ARTHROPLASTY ANTERIOR APPROACH;  Surgeon: Gaynelle Arabian, MD;  Location: [facility];  Service: Orthopedics;  Laterality: Left;   TOTAL  HIP ARTHROPLASTY Right 09/28/2017   Procedure: RIGHT TOTAL HIP ARTHROPLASTY ANTERIOR APPROACH;  Surgeon: Gaynelle Arabian, MD;  Location: [facility];  Service: Orthopedics;  Laterality: Right;   TOTAL KNEE ARTHROPLASTY Right 07/30/2019   Procedure: TOTAL KNEE ARTHROPLASTY;  Surgeon: Gaynelle Arabian, MD;  Location: [facility];  Service: Orthopedics;  Laterality: Right;  35mn   TRANSURETHRAL RESECTION OF BLADDER TUMOR N/A 09/22/2016   Procedure: TRANSURETHRAL RESECTION OF BLADDER TUMOR (TURBT);  Surgeon: TAlexis Frock MD;  [facility]  Service: Urology;  Laterality: N/A;    Family History  Problem Relation Age of Onset    Arthritis Mother    Hypertension Mother    Deep vein thrombosis Mother        recurrent, secondary to Bleeding disorder   Arthritis Father    Stroke Father    Bladder Cancer Father    Diabetes Sister    Heart disease Sister    Obesity Sister    Arthritis Sister    Hypertension Sister    Heart disease Maternal Grandmother    Heart disease Maternal Grandfather    Peripheral vascular disease Paternal Grandmother        s/p leg amputation   Stroke Paternal Grandfather    Esophageal cancer Paternal Aunt    Colon cancer Neg Hx    Colon polyps Neg Hx    Rectal cancer Neg Hx    Stomach cancer Neg Hx     Social History:  reports that she quit smoking about 58 years ago. Her smoking use included cigarettes. She has never used smokeless tobacco. She reports current alcohol use of about 7.0 standard drinks of alcohol per week. She reports that she does not use drugs.  BMI: Estimated body mass index is 21.18 kg/m as calculated from the following:   Height as of this encounter: '5\' 2"'$  (1.575 m).   Weight as of this encounter: 52.5 kg.  Lab Results  Component Value Date   ALBUMIN 4.5 10/12/2022   Diabetes: Patient does not have a diagnosis of diabetes.     Smoking Status:       Medications Prior to Admission  Medication Sig Dispense Refill   amLODipine (NORVASC) 5 MG tablet TAKE 1 TABLET (5 MG TOTAL) BY MOUTH DAILY. 90 tablet 3   Apoaequorin (PREVAGEN PO) Take 1 tablet by mouth in the morning.     carbidopa-levodopa (SINEMET IR) 25-100 MG per tablet Take 0.5-1 tablets by mouth See admin instructions. Take 1 tablet by mouth in the morning upon waking & take 0.5 tablet by mouth every 2 hours thereafter for a total of 6 tablets daily.     Carbidopa-Levodopa ER (SINEMET CR) 25-100 MG tablet controlled release Take 1 tablet by mouth at bedtime.     cholecalciferol (VITAMIN D3) 25 MCG (1000 UNIT) tablet Take 1,000 Units by mouth in the morning.     Coenzyme Q10 (COQ10 PO) Take 1  tablet by mouth in the morning.     diazepam (VALIUM) 5 MG tablet TAKE 1 TABLET (5 MG TOTAL) BY MOUTH EVERY 12 (TWELVE) HOURS AS NEEDED. FOR ANXIETY 60 tablet 1   DIGESTIVE ENZYMES PO Take 1 capsule by mouth 3 (three) times daily before meals. Active Enzymes by SDelynn Flavin    EPINEPHrine 0.3 mg/0.3 mL IJ SOAJ injection Inject 0.3 mg into the muscle as needed for anaphylaxis.     estradiol (ESTRACE) 0.5 MG tablet TAKE 1/2 OF A TABLET (0.25 MG TOTAL) BY MOUTH EVERY DAY 45  tablet 1   fluticasone (CUTIVATE) 0.05 % cream Apply 1 application topically daily as needed (scalp psoriasis).  1   hydrochlorothiazide (HYDRODIURIL) 25 MG tablet Take 1 tablet (25 mg total) by mouth daily. 90 tablet 1   medroxyPROGESTERone (PROVERA) 2.5 MG tablet TAKE 0.5 TABLETS BY MOUTH DAILY. 45 tablet 3   melatonin 5 MG TABS Take 5 mg by mouth at bedtime.     meloxicam (MOBIC) 15 MG tablet Take 15 mg by mouth daily as needed (joint pain (max 3-4 times/weekly)).     Multiple Vitamin (MULTIVITAMIN WITH MINERALS) TABS tablet Take 1 tablet by mouth in the morning. Centrum Silver     Polyethyl Glycol-Propyl Glycol (SYSTANE) 0.4-0.3 % SOLN Place 1-2 drops into both eyes 3 (three) times daily as needed (dry/irritated eyes.).     Probiotic Product (PROBIOTIC PO) Take 1 capsule by mouth in the morning and at bedtime. Restore Ultimate Probiotic     golimumab (SIMPONI ARIA) 50 MG/4ML SOLN injection Inject 50 mg into the vein See admin instructions. Given every 2 months - Last given 06/28/2019     umeclidinium-vilanterol (ANORO ELLIPTA) 62.5-25 MCG/ACT AEPB Inhale 1 puff into the lungs daily. 1 each 3     Physical Exam: Patient is a petite, slender female who is alert and oriented.  Left shoulder demonstrates painful and guarded motion as noted at her recent office visits.  She has globally decreased strength with manual muscle testing.  She is otherwise neurovascular intact in left upper extremity.  Radiographs  Plain films of the  left shoulder reviewed demonstrating severe osteoarthritis.  Vitals  Temp:  [97.5 F (36.4 C)-97.7 F (36.5 C)] 97.7 F (36.5 C) (11/30 0904) Pulse Rate:  [75-112] 75 (11/30 0916) Resp:  [8-21] 14 (11/30 0916) BP: (122-143)/(66-80) 137/71 (11/30 0915) SpO2:  [95 %-100 %] 100 % (11/30 0916) Weight:  [52.5 kg] 52.5 kg (11/30 0811)  Assessment/Plan  Impression: Left shoulder advanced osteoarthritis and rotator cuff arthropathy  Plan of Action: Procedure(s): REVERSE SHOULDER ARTHROPLASTY  Nakea Gouger M Katty Fretwell 10/14/2022, 9:37 AM Contact # [phone]

## 2022-10-15 ENCOUNTER — Encounter (HOSPITAL_COMMUNITY): Payer: Self-pay | Admitting: Orthopedic Surgery

## 2022-10-15 DIAGNOSIS — Z87891 Personal history of nicotine dependence: Secondary | ICD-10-CM | POA: Diagnosis not present

## 2022-10-15 DIAGNOSIS — Z79899 Other long term (current) drug therapy: Secondary | ICD-10-CM | POA: Diagnosis not present

## 2022-10-15 DIAGNOSIS — I10 Essential (primary) hypertension: Secondary | ICD-10-CM | POA: Diagnosis not present

## 2022-10-15 DIAGNOSIS — F419 Anxiety disorder, unspecified: Secondary | ICD-10-CM | POA: Diagnosis not present

## 2022-10-15 DIAGNOSIS — F32A Depression, unspecified: Secondary | ICD-10-CM | POA: Diagnosis not present

## 2022-10-15 DIAGNOSIS — M19012 Primary osteoarthritis, left shoulder: Secondary | ICD-10-CM | POA: Diagnosis not present

## 2022-10-15 NOTE — Discharge Summary (Signed)
PATIENT ID:      Stephanie [name]  MRN:     [id number] DOB/AGE:    86-08-1932 / 86 y.o.     DISCHARGE SUMMARY  ADMISSION DATE:    10/14/2022 DISCHARGE DATE:  10/15/2022  ADMISSION DIAGNOSIS: Left shoulder advanced osteoarthritis and rotator cuff arthropathy Past Medical History:  Diagnosis Date   Allergy    Anemia    past hx    Arthropathy of cervical spine 11/27/2012   Right  Xray at [facility]  On 04/08/2016  AP, lateral, and lateral flexion and extension views of the cervical spine are submitted for evaluation.   No acute fracture identified in the cervical spine.   There is redemonstration of approximately 2 mm of C3 on C4 anterolisthesis in neutral which slightly increases in flexion relative to neutral and extension. Slight C5 on C6 retrolisthesis does not change i   Benign hypertension without congestive heart failure    Benign mole    right hcets mole, bleeds when dried with towel   Bilateral dry eyes    Bladder cancer (HCC) 11/04/16   Cervical spondylosis    Cervicalgia    2 screws in place    Depression with anxiety November 04, 2016   prior to husbands death    Dyspnea    Gross hematuria    developed after first hip replacment, led to indicental finding of bladder tumor, bleeding now resolved    History of kidney stones    1970's   Left hand weakness    due to reinjury of flxor tendon s/p tendon surgery march 12-2018   Lumbar stenosis    Malignant tumor of urinary bladder ([place]) 07/2016   Pre-stage I   OA (osteoarthritis)    Parkinson disease dx 2012   neurologist-  dr siddiqui at [place] [facility])     Dr Trudie Reed , [place] [facility]    Rupture of flexor tendon of hand 2020   right    DISCHARGE DIAGNOSIS:   Principal Problem:   S/P reverse total shoulder arthroplasty, left   PROCEDURE: Procedure(s): REVERSE SHOULDER ARTHROPLASTY on 10/14/2022  CONSULTS:    HISTORY:  See H&P in chart.  HOSPITAL COURSE:  Stephanie [name] is a 64 y.o. admitted on 10/14/2022  with a diagnosis of Left shoulder advanced osteoarthritis and rotator cuff arthropathy.  They were brought to the operating room on 10/14/2022 and underwent Procedure(s): Rossiter.    They were given perioperative antibiotics:  Anti-infectives (From admission, onward)    Start     Dose/Rate Route Frequency Ordered Stop   10/14/22 0952  vancomycin (VANCOCIN) powder  Status:  Discontinued          As needed 10/14/22 0952 10/14/22 1353   10/14/22 0800  ceFAZolin (ANCEF) IVPB 2g/100 mL premix        2 g 200 mL/hr over 30 Minutes Intravenous On call to O.R. 10/14/22 0749 10/14/22 1039     .  Patient underwent the above named procedure and tolerated it well. The following day they were hemodynamically stable and pain was controlled on oral analgesics. They were neurovascularly intact to the operative extremity. OT was ordered and worked with patient per protocol. They were medically and orthopaedically stable for discharge on .    DIAGNOSTIC STUDIES:  RECENT RADIOGRAPHIC STUDIES :  No results found.  RECENT VITAL SIGNS:  Patient Vitals for the past 24 hrs:  BP Temp Temp src Pulse Resp SpO2 Height Weight  10/15/22 0504  120/72 98 F (36.7 C) Oral 83 17 97 % -- --  10/15/22 0143 (!) 156/85 97.8 F (36.6 C) Oral 95 17 100 % -- --  10/14/22 2221 (!) 139/92 97.8 F (36.6 C) Oral 93 17 99 % -- --  10/14/22 1722 (!) 145/91 98.7 F (37.1 C) -- 98 18 96 % -- --  10/14/22 1350 (!) 156/84 97.6 F (36.4 C) Oral 90 17 100 % -- --  10/14/22 1330 -- -- -- 85 16 95 % -- --  10/14/22 1315 (!) 157/87 -- -- 82 14 95 % -- --  10/14/22 1300 (!) 152/91 -- -- 83 14 94 % -- --  10/14/22 1245 (!) 152/84 -- -- 80 14 95 % -- --  10/14/22 1230 (!) 148/96 -- -- 84 (!) 25 93 % -- --  10/14/22 1215 (!) 164/85 (!) 97 F (36.1 C) -- 76 15 100 % -- --  10/14/22 1203 -- (!) 96.8 F (36 C) -- -- -- -- -- --  10/14/22 0916 -- -- -- 75 14 100 % -- --  10/14/22 0915 137/71 -- -- 76 (!) 21 100 %  -- --  10/14/22 0914 -- -- -- 77 19 99 % -- --  10/14/22 0913 -- -- -- 80 14 98 % -- --  10/14/22 0912 -- -- -- 77 12 99 % -- --  10/14/22 0911 131/66 -- -- 79 14 96 % -- --  10/14/22 0910 -- -- -- 80 12 97 % -- --  10/14/22 0909 -- -- -- 78 16 95 % -- --  10/14/22 0908 -- -- -- 80 11 99 % -- --  10/14/22 0907 -- -- -- 82 14 98 % -- --  10/14/22 0906 -- -- -- 80 (!) 8 100 % -- --  10/14/22 0905 -- -- -- 81 13 100 % -- --  10/14/22 0904 (!) 143/76 97.7 F (36.5 C) Oral 78 10 100 % -- --  10/14/22 0815 125/71 (!) 97.5 F (36.4 C) Oral 79 16 98 % -- --  10/14/22 0811 -- -- -- -- -- -- '5\' 2"'$  (1.575 m) 52.5 kg  .  RECENT EKG RESULTS:    Orders placed or performed in visit on 08/02/22   EKG 12-Lead    DISCHARGE INSTRUCTIONS:    DISCHARGE MEDICATIONS:   Allergies as of 10/15/2022       Reactions   Cleocin [clindamycin] Diarrhea   Codeine Nausea And Vomiting   Hornet Venom    Penicillins Itching, Swelling   Has patient had a PCN reaction causing immediate rash, facial/tongue/throat swelling, SOB or lightheadedness with hypotension: Yes Has patient had a PCN reaction causing severe rash involving mucus membranes or skin necrosis: No Has patient had a PCN reaction that required hospitalization: No Has patient had a PCN reaction occurring within the last 10 years: No If all of the above answers are "NO", then may proceed with Cephalosporin use.   Vibramycin [doxycycline] Diarrhea   Yellow Jacket Venom Swelling   Lodine [etodolac] Rash   Tramadol Itching, Rash        Medication List     TAKE these medications    amLODipine 5 MG tablet Commonly known as: NORVASC TAKE 1 TABLET (5 MG TOTAL) BY MOUTH DAILY.   Anoro Ellipta 62.5-25 MCG/ACT Aepb Generic drug: umeclidinium-vilanterol Inhale 1 puff into the lungs daily.   Carbidopa-Levodopa ER 25-100 MG tablet controlled release Commonly known as: SINEMET CR Take 1 tablet by mouth at bedtime.  carbidopa-levodopa 25-100 MG  tablet Commonly known as: SINEMET IR Take 0.5-1 tablets by mouth See admin instructions. Take 1 tablet by mouth in the morning upon waking & take 0.5 tablet by mouth every 2 hours thereafter for a total of 6 tablets daily.   cholecalciferol 25 MCG (1000 UNIT) tablet Commonly known as: VITAMIN D3 Take 1,000 Units by mouth in the morning.   COQ10 PO Take 1 tablet by mouth in the morning.   diazepam 5 MG tablet Commonly known as: VALIUM TAKE 1 TABLET (5 MG TOTAL) BY MOUTH EVERY 12 (TWELVE) HOURS AS NEEDED. FOR ANXIETY   DIGESTIVE ENZYMES PO Take 1 capsule by mouth 3 (three) times daily before meals. Active Enzymes by Delynn Flavin   EPINEPHrine 0.3 mg/0.3 mL Soaj injection Commonly known as: EPI-PEN Inject 0.3 mg into the muscle as needed for anaphylaxis.   estradiol 0.5 MG tablet Commonly known as: ESTRACE TAKE 1/2 OF A TABLET (0.25 MG TOTAL) BY MOUTH EVERY DAY   fluticasone 0.05 % cream Commonly known as: CUTIVATE Apply 1 application topically daily as needed (scalp psoriasis).   hydrochlorothiazide 25 MG tablet Commonly known as: HYDRODIURIL Take 1 tablet (25 mg total) by mouth daily.   medroxyPROGESTERone 2.5 MG tablet Commonly known as: PROVERA TAKE 0.5 TABLETS BY MOUTH DAILY.   melatonin 5 MG Tabs Take 5 mg by mouth at bedtime.   meloxicam 15 MG tablet Commonly known as: MOBIC Take 15 mg by mouth daily as needed (joint pain (max 3-4 times/weekly)).   multivitamin with minerals Tabs tablet Take 1 tablet by mouth in the morning. Centrum Silver   PREVAGEN PO Take 1 tablet by mouth in the morning.   PROBIOTIC PO Take 1 capsule by mouth in the morning and at bedtime. Restore Ultimate Probiotic   Simponi Aria 50 MG/4ML Soln injection Generic drug: golimumab Inject 50 mg into the vein See admin instructions. Given every 2 months - Last given 06/28/2019   Systane 0.4-0.3 % Soln Generic drug: Polyethyl Glycol-Propyl Glycol Place 1-2 drops into both eyes 3 (three)  times daily as needed (dry/irritated eyes.).        FOLLOW UP VISIT:    Follow-up Information     Justice Britain, MD. Go on 10/27/2022.   Specialty: Orthopedic Surgery Why: You are scheduled for follow up on Wednesday December 13th at 10:15am. Contact information: [address] [place] [address] [phone]                 DISCHARGE TO: Home   DISCHARGE CONDITION:  Thereasa Parkin Rowen Hur for Dr. Justice Britain 10/15/2022, 7:58 AM

## 2022-10-15 NOTE — Plan of Care (Signed)
Discharge instructions given to the patient including medications and follow up.  

## 2022-10-15 NOTE — Evaluation (Signed)
Occupational Therapy Evaluation Patient Details Name: Stephanie [name] MRN: [id number] DOB: 12-08-1931 Today's Date: 10/15/2022   History of Present Illness Patient s/p shoulder surgery   Clinical Impression   Stephanie [name] is an 88 year old woman s/p shoulder replacement without functional use of left non-dominant upper extremity secondary to effects of surgery and interscalene block and shoulder precautions. Therapist provided education and instruction to patient in regards to exercises, precautions, positioning, donning upper extremity clothing and bathing while maintaining shoulder precautions, ice and edema management and donning/doffing sling. Therapist recorded instructions for patient's daughter to watch. Patient verbalized understanding and demonstrated as needed. Patient will have assist of friend and daughter at discharge. She did well with ADLs.  Patient to follow up with MD for further therapy needs.        Recommendations for follow up therapy are one component of a multi-disciplinary discharge planning process, led by the attending physician.  Recommendations may be updated based on patient status, additional functional criteria and insurance authorization.   Follow Up Recommendations  Follow physician's recommendations for discharge plan and follow up therapies     Assistance Recommended at Discharge PRN  Patient can return home with the following Assistance with cooking/housework    Functional Status Assessment  Patient has had a recent decline in their functional status and demonstrates the ability to make significant improvements in function in a reasonable and predictable amount of time.  Equipment Recommendations  None recommended by OT    Recommendations for Other Services       Precautions / Restrictions Precautions Precautions: Shoulder Type of Shoulder Precautions: If sitting in controlled environment, ok to come out of sling to give neck a break. Please  sleep in it to protect until follow up in office. OK to use operative arm for feeding, hygiene and ADLs.   Ok to instruct Pendulums and lap slides as exercises. Ok to use operative arm within the following parameters for ADL purposes  New ROM (8/18)   Ok for PROM, AAROM, AROM within pain tolerance and within the following ROM   ER 20   ABD 45   FE 60 Precaution Booklet Issued:  (handouts) Required Braces or Orthoses: Sling Restrictions Weight Bearing Restrictions: Yes LUE Weight Bearing: Non weight bearing      Mobility Bed Mobility Overal bed mobility: Independent                  Transfers Overall transfer level: Independent                        Balance Overall balance assessment: Mild deficits observed, not formally tested                                         ADL either performed or assessed with clinical judgement   ADL Overall ADL's : Needs assistance/impaired Eating/Feeding: Modified independent   Grooming: Modified independent   Upper Body Bathing: Moderate assistance   Lower Body Bathing: Modified independent   Upper Body Dressing : Moderate assistance   Lower Body Dressing: Modified independent   Toilet Transfer: Modified Independent   Toileting- Clothing Manipulation and Hygiene: Modified independent       Functional mobility during ADLs: Supervision/safety       Vision Patient Visual Report: No change from baseline       Perception  Praxis      Pertinent Vitals/Pain Pain Assessment Pain Assessment: No/denies pain     Hand Dominance     Extremity/Trunk Assessment Upper Extremity Assessment Upper Extremity Assessment: LUE deficits/detail LUE Deficits / Details: Sensation and motor control is returning in lower arm but still imapired   Lower Extremity Assessment Lower Extremity Assessment: Overall WFL for tasks assessed   Cervical / Trunk Assessment Cervical / Trunk Assessment: Normal    Communication Communication Communication: HOH   Cognition Arousal/Alertness: Awake/alert Behavior During Therapy: WFL for tasks assessed/performed Overall Cognitive Status: Within Functional Limits for tasks assessed                                       General Comments       Exercises     Shoulder Instructions  See above. Handouts provided to maximize retention of information as well as recorded.     Home Living Family/patient expects to be discharged to:: Private residence Living Arrangements: Alone Available Help at Discharge: Family;Friend(s) Type of Home: House Home Access: Stairs to enter [employer] of Steps: 4 Entrance Stairs-Rails: Left;Right Home Layout: One level     Bathroom Shower/Tub: Occupational psychologist: Handicapped height Bathroom Accessibility: Yes   Home Equipment: Conservation officer, nature (2 wheels);Grab bars - tub/shower;Shower seat   Additional Comments: Patient's daughter will be assisting at discharge, she also has a friend      Prior Functioning/Environment Prior Level of Function : Independent/Modified Independent                        OT Problem List: Decreased strength;Decreased range of motion;Impaired UE functional use      OT Treatment/Interventions:      OT Goals(Current goals can be found in the care plan section) Acute Rehab OT Goals OT Goal Formulation: All assessment and education complete, DC therapy  OT Frequency:      Co-evaluation              AM-PAC OT "6 Clicks" Daily Activity     Outcome Measure Help from another person eating meals?: None Help from another person taking care of personal grooming?: None   Help from another person bathing (including washing, rinsing, drying)?: None Help from another person to put on and taking off regular upper body clothing?: A Lot Help from another person to put on and taking off regular lower body clothing?: None 6 Click Score:  18   End of Session Nurse Communication: Mobility status  Activity Tolerance: Patient tolerated treatment well Patient left: in chair  OT Visit Diagnosis: Pain                Time: 9563-8756 OT Time Calculation (min): 29 min Charges:  OT General Charges $OT Visit: 1 Visit OT Evaluation $OT Eval Low Complexity: 1 Low OT Treatments $Self Care/Home Management : 8-22 mins  Gustavo Lah, OTR/L Acute Care Rehab Services  Office [phone]   Lenward Chancellor 10/15/2022, 10:41 AM

## 2022-10-27 DIAGNOSIS — Z471 Aftercare following joint replacement surgery: Secondary | ICD-10-CM | POA: Diagnosis not present

## 2022-10-27 DIAGNOSIS — Z96612 Presence of left artificial shoulder joint: Secondary | ICD-10-CM | POA: Diagnosis not present

## 2022-11-01 ENCOUNTER — Ambulatory Visit: Payer: Self-pay | Admitting: Licensed Clinical Social Worker

## 2022-11-01 NOTE — Patient Outreach (Signed)
  Care Coordination   Follow Up Visit Note   11/01/2022 Name: Stephanie [name] MRN: [id number] DOB: 05/10/1932  Stephanie [name] is a 88 y.o. year old female who sees Mosie Lukes, MD for primary care. I spoke with  [name] by phone today.  What matters to the patients health and wellness today? Recently client had shoulder surgery. She is scheduled to start physical therapy sessions in January of 2024    Goals Addressed               This Visit's Progress     Patient Stated she recently had shoulder surgery and is doing better since shoulder surgery. She is planning to start physical therapy sessions in January of 2024 (pt-stated)        Interventions: Patient and LCSW spoke today via phone about her needs. She said she had recently had shoulder surgery and is doing much better since shoulder surgery She spoke of support from her daughter. Client said that she (client) drives to grocery store to obtain her grocery items as needed. Client and LCSW spoke of upcoming client appointments Discussed medication procurement of client.  Discussed sleeping issues of client. She said she is sleeping in recliner at night . She said she is currently wearing a sling (since surgery). She said she did not think she would have to wear sling very much longer Discussed RN nursing support with program. Client was encouraged to call LCSW at [phone] as needed for SW support.     SDOH assessments and interventions completed:  Yes  SDOH Interventions Today    Flowsheet Row Most Recent Value  SDOH Interventions   Depression Interventions/Treatment  Counseling  Stress Interventions Provide Counseling  [client has stress related to managing medical needs]        Care Coordination Interventions:  Yes, provided   Follow up plan: Follow up call scheduled for 12/06/22 at 2:30 PM     Encounter Outcome:  Pt. Visit Completed   Norva Riffle.Maryfer Tauzin MSW, [place] Psychologist, forensic [facility] Care Management [phone]

## 2022-11-01 NOTE — Patient Instructions (Signed)
Visit Information  Thank you for taking time to visit with me today. Please don't hesitate to contact me if I can be of assistance to you before our next scheduled telephone appointment.  Following are the goals we discussed today:   Our next appointment is by telephone on 12/06/22 at 2:30 PM   Please call the care guide team at [phone] if you need to cancel or reschedule your appointment.   If you are experiencing a Mental Health or [place] or need someone to talk to, please go to [facility] [place] [phone])   Following is a copy of your full plan of care:   Interventions: Patient and LCSW spoke today via phone about her needs. She said she had recently had shoulder surgery and is doing much better since shoulder surgery She spoke of support from her daughter. Client said that she (client) drives to grocery store to obtain her grocery items as needed. Client and LCSW spoke of upcoming client appointments Discussed medication procurement of client.  Discussed sleeping issues of client. She said she is sleeping in recliner at night . She said she is currently wearing a sling (since surgery). She said she did not think she would have to wear sling very much longer Discussed RN nursing support with program. Client was encouraged to call LCSW at [phone] as needed for SW support.  Ms. Lender was given information about Care Management services by the embedded care coordination team including:  Care Management services include personalized support from designated clinical staff supervised by her physician, including individualized plan of care and coordination with other care providers 24/7 contact phone numbers for assistance for urgent and routine care needs. The patient may stop CCM services at any time (effective at the end of the month) by phone call to the office staff.  Patient agreed to services and  verbal consent obtained.   Norva Riffle.Geneva Pallas MSW, [place] [employer] representative [facility] Care Management [phone]

## 2022-11-11 ENCOUNTER — Encounter: Payer: Self-pay | Admitting: Family Medicine

## 2022-11-11 ENCOUNTER — Ambulatory Visit (INDEPENDENT_AMBULATORY_CARE_PROVIDER_SITE_OTHER): Payer: Medicare Other | Admitting: Family Medicine

## 2022-11-11 VITALS — BP 110/70 | HR 103 | Temp 97.9°F | Resp 16 | Ht 62.5 in | Wt 111.0 lb

## 2022-11-11 DIAGNOSIS — R059 Cough, unspecified: Secondary | ICD-10-CM

## 2022-11-11 DIAGNOSIS — R5381 Other malaise: Secondary | ICD-10-CM | POA: Diagnosis not present

## 2022-11-11 DIAGNOSIS — R197 Diarrhea, unspecified: Secondary | ICD-10-CM | POA: Diagnosis not present

## 2022-11-11 DIAGNOSIS — R110 Nausea: Secondary | ICD-10-CM

## 2022-11-11 LAB — POC COVID19 BINAXNOW: SARS Coronavirus 2 Ag: NEGATIVE

## 2022-11-11 LAB — POCT INFLUENZA A/B
Influenza A, POC: NEGATIVE
Influenza B, POC: NEGATIVE

## 2022-11-11 NOTE — Patient Instructions (Addendum)
Flu and COVID negative Updating labs today Continue as needed nausea medicine Make sure you are staying well hydrated and gradually advance diet as tolerated  Please contact office for follow-up if symptoms do not improve or worsen. Seek emergency care if symptoms become severe.

## 2022-11-11 NOTE — Progress Notes (Signed)
Acute Office Visit  Subjective:     Patient ID: Stephanie [name], female    DOB: [birth year], 88 y.o.   MRN: [id number]  Chief Complaint  Patient presents with   Nausea   Fatigue   Cough   Weight Loss     Patient is in today for nausea, malaise, diarrhea, cough.   Patient states that for the past 3-4 days she has been very nauseous, causing decreased appetite and a couple pounds weight loss. In addition she has had some diarrhea (unsure if this is related because she does have IBS and occasional diarrhea at baseline). Additionally she has felt some fatigue/malaise for this time period as well. She denies any vomiting, chest pain, abdominal pain, urinary changes, blood in stool, fever, chills. No recent diet changes or new medications. No known sick contacts. She has also had an occasional cough a few times per day, but not severe per patient. She felt flushed one day, but has not had any fevers. She took a nausea pill (unsure medication name) that she had leftover from recent shoulder surgery and that did seem to help her today. Reports she is staying well hydrated.    All review of systems negative except what is listed in the HPI      Objective:    BP 110/70   Pulse (!) 103   Temp 97.9 F (36.6 C)   Resp 16   Ht 5' 2.5" (1.588 m)   Wt 111 lb (50.3 kg)   LMP 01/13/2013 Comment: spotting-had benign endometrial biopsy   SpO2 98%   BMI 19.98 kg/m    Physical Exam Vitals reviewed.  Constitutional:      Appearance: Normal appearance.  Cardiovascular:     Rate and Rhythm: Normal rate and regular rhythm.     Pulses: Normal pulses.     Heart sounds: Normal heart sounds.  Pulmonary:     Effort: Pulmonary effort is normal.     Breath sounds: Normal breath sounds.  Abdominal:     General: Abdomen is flat. Bowel sounds are normal. There is no distension.     Palpations: Abdomen is soft. There is no mass.     Tenderness: There is no abdominal tenderness. There is no right  CVA tenderness, left CVA tenderness, guarding or rebound.  Skin:    General: Skin is warm and dry.  Neurological:     Mental Status: She is alert and oriented to person, place, and time.  Psychiatric:        Mood and Affect: Mood normal.        Behavior: Behavior normal.        Thought Content: Thought content normal.        Judgment: Judgment normal.     Results for orders placed or performed in visit on 11/11/22  POCT Influenza A/B  Result Value Ref Range   Influenza A, POC Negative Negative   Influenza B, POC Negative Negative  POC COVID-19  Result Value Ref Range   SARS Coronavirus 2 Ag Negative Negative        Assessment & Plan:   1. Nausea 2. Diarrhea, unspecified type 3. Malaise 4. Cough, unspecified type Flu and COVID negative Updating labs today Continue as needed nausea medicine Make sure you are staying well hydrated and gradually advance diet as tolerated  Please contact office for follow-up if symptoms do not improve or worsen. Seek emergency care if symptoms become severe.   - CBC - Comp Met (  CMET) - Lipase - Amylase - POCT Influenza A/B - POC COVID-19    No orders of the defined types were placed in this encounter.   Return if symptoms worsen or fail to improve.  Terrilyn Saver, NP

## 2022-11-12 ENCOUNTER — Emergency Department (HOSPITAL_BASED_OUTPATIENT_CLINIC_OR_DEPARTMENT_OTHER)
Admission: EM | Admit: 2022-11-12 | Discharge: 2022-11-12 | Disposition: A | Discharge status: Home / Self Care | Payer: Medicare Other | Attending: Emergency Medicine | Admitting: Emergency Medicine

## 2022-11-12 ENCOUNTER — Other Ambulatory Visit: Payer: Self-pay

## 2022-11-12 ENCOUNTER — Encounter (HOSPITAL_BASED_OUTPATIENT_CLINIC_OR_DEPARTMENT_OTHER): Payer: Self-pay | Admitting: Pediatrics

## 2022-11-12 DIAGNOSIS — E876 Hypokalemia: Secondary | ICD-10-CM | POA: Diagnosis not present

## 2022-11-12 DIAGNOSIS — Z79899 Other long term (current) drug therapy: Secondary | ICD-10-CM | POA: Insufficient documentation

## 2022-11-12 DIAGNOSIS — R1111 Vomiting without nausea: Secondary | ICD-10-CM | POA: Diagnosis not present

## 2022-11-12 LAB — CBC
HCT: 40 % (ref 36.0–46.0)
HCT: 39.2 % (ref 36.0–46.0)
Hemoglobin: 13.6 g/dL (ref 12.0–15.0)
Hemoglobin: 13.5 g/dL (ref 12.0–15.0)
MCH: 29.9 pg (ref 26.0–34.0)
MCHC: 33.9 g/dL (ref 30.0–36.0)
MCHC: 34.4 g/dL (ref 30.0–36.0)
MCV: 89.6 fl (ref 78.0–100.0)
MCV: 86.9 fL (ref 80.0–100.0)
Platelets: 260 10*3/uL (ref 150.0–400.0)
Platelets: 234 10*3/uL (ref 150–400)
RBC: 4.46 Mil/uL (ref 3.87–5.11)
RBC: 4.51 MIL/uL (ref 3.87–5.11)
RDW: 13.6 % (ref 11.5–15.5)
RDW: 13 % (ref 11.5–15.5)
WBC: 7.5 10*3/uL (ref 4.0–10.5)
WBC: 7.4 10*3/uL (ref 4.0–10.5)
nRBC: 0 % (ref 0.0–0.2)

## 2022-11-12 LAB — BASIC METABOLIC PANEL
Anion gap: 11 (ref 5–15)
BUN: 17 mg/dL (ref 8–23)
CO2: 26 mmol/L (ref 22–32)
Calcium: 9.3 mg/dL (ref 8.9–10.3)
Chloride: 93 mmol/L — ABNORMAL LOW (ref 98–111)
Creatinine, Ser: 0.82 mg/dL (ref 0.44–1.00)
GFR, Estimated: >60 mL/min (ref >60)
Glucose, Bld: 115 mg/dL — ABNORMAL HIGH (ref 70–99)
Potassium: 2.6 mmol/L — CL (ref 3.5–5.1)
Sodium: 130 mmol/L — ABNORMAL LOW (ref 135–145)

## 2022-11-12 LAB — COMPREHENSIVE METABOLIC PANEL
ALT: 5 U/L (ref 0–35)
AST: 16 U/L (ref 0–37)
Albumin: 4.6 g/dL (ref 3.5–5.2)
Alkaline Phosphatase: 51 U/L (ref 39–117)
BUN: 20 mg/dL (ref 6–23)
CO2: 28 mEq/L (ref 19–32)
Calcium: 9.7 mg/dL (ref 8.4–10.5)
Chloride: 93 mEq/L — ABNORMAL LOW (ref 96–112)
Creatinine, Ser: 0.86 mg/dL (ref 0.40–1.20)
GFR: 60.14 mL/min (ref >60.00)
Glucose, Bld: 124 mg/dL — ABNORMAL HIGH (ref 70–99)
Potassium: 2.9 mEq/L — ABNORMAL LOW (ref 3.5–5.1)
Sodium: 132 mEq/L — ABNORMAL LOW (ref 135–145)
Total Bilirubin: 0.8 mg/dL (ref 0.2–1.2)
Total Protein: 6.5 g/dL (ref 6.0–8.3)

## 2022-11-12 LAB — LIPASE: Lipase: 28 U/L (ref 11.0–59.0)

## 2022-11-12 LAB — AMYLASE: Amylase: 39 U/L (ref 27–131)

## 2022-11-12 MED ORDER — POTASSIUM CHLORIDE 10 MEQ/100ML IV SOLN
10.0000 meq | Freq: Once | INTRAVENOUS | Status: AC
  Administered 2022-11-12: 10 meq via INTRAVENOUS
  Filled 2022-11-12: qty 100

## 2022-11-12 MED ORDER — POTASSIUM CHLORIDE CRYS ER 20 MEQ PO TBCR
40.0000 meq | EXTENDED_RELEASE_TABLET | Freq: Once | ORAL | Status: AC
  Administered 2022-11-12: 40 meq via ORAL
  Filled 2022-11-12: qty 2

## 2022-11-12 MED ORDER — SODIUM CHLORIDE 0.9 % IV SOLN: INTRAVENOUS | Status: DC | PRN

## 2022-11-12 MED ORDER — POTASSIUM CHLORIDE ER 20 MEQ PO TBCR: 40.0000 meq | EXTENDED_RELEASE_TABLET | Freq: Two times a day (BID) | ORAL | 0 refills | Status: DC

## 2022-11-12 NOTE — Discharge Instructions (Addendum)
Continue oral potassium at home as prescribed.  Take next dose tomorrow morning.  If you continue to have any GI symptoms including worsening nausea vomiting and fatigue please return for reevaluation otherwise I would have you follow-up with your primary care doctor early next week to recheck your labs.

## 2022-11-12 NOTE — ED Triage Notes (Signed)
Reported was informed by PCP that her potassium is low (2.9) drawn yesterday. States she had left shoulder surgery on end of November and has had low appetite since then along with some nausea,

## 2022-11-12 NOTE — ED Notes (Signed)
Pt requested to take her home med's (sinemet) for her parkinson's. EDP aware and gave pt the ok to do so.

## 2022-11-12 NOTE — ED Provider Notes (Signed)
[place] EMERGENCY DEPARTMENT Provider Note   CSN: [id number] Arrival date & time: 11/12/22  1610     History  Chief Complaint  Patient presents with   Low Potassium    Stephanie [name] is a 88 y.o. female.  Patient here because Stephanie [name] had a potassium of 2.9.  Was having some GI symptoms that have resolved.  Sent by primary care for potassium repletion.  Stephanie [name] denies any chest pain nausea or vomiting currently.  Nothing makes it worse or better.  History of Parkinson's.  Stephanie [name] had negative flu and COVID test yesterday.  Lab work was otherwise normal Stephanie [name] states.  The history is provided by the patient.       Home Medications Prior to Admission medications   Medication Sig Start Date End Date Taking? Authorizing Provider  potassium chloride 20 MEQ TBCR Take 40 mEq by mouth 2 (two) times daily for 4 days. 11/12/22 11/16/22 Yes Domenic Schoenberger, DO  amLODipine (NORVASC) 5 MG tablet TAKE 1 TABLET (5 MG TOTAL) BY MOUTH DAILY. 02/22/22   Mosie Lukes, MD  Apoaequorin (PREVAGEN PO) Take 1 tablet by mouth in the morning.    [provider]  carbidopa-levodopa (SINEMET IR) 25-100 MG per tablet Take 0.5-1 tablets by mouth See admin instructions. Take 1 tablet by mouth in the morning upon waking & take 0.5 tablet by mouth every 2 hours thereafter for a total of 6 tablets daily. 06/04/13   [provider]  Carbidopa-Levodopa ER (SINEMET CR) 25-100 MG tablet controlled release Take 1 tablet by mouth at bedtime.    [provider]  cholecalciferol (VITAMIN D3) 25 MCG (1000 UNIT) tablet Take 1,000 Units by mouth in the morning.    [provider]  Coenzyme Q10 (COQ10 PO) Take 1 tablet by mouth in the morning.    [provider]  diazepam (VALIUM) 5 MG tablet TAKE 1 TABLET (5 MG TOTAL) BY MOUTH EVERY 12 (TWELVE) HOURS AS NEEDED. FOR ANXIETY 08/02/22   Mosie Lukes, MD  DIGESTIVE ENZYMES PO Take 1 capsule by mouth 3 (three) times daily before meals.  Active Enzymes by Delynn Flavin    [provider]  EPINEPHrine 0.3 mg/0.3 mL IJ SOAJ injection Inject 0.3 mg into the muscle as needed for anaphylaxis.    [provider]  estradiol (ESTRACE) 0.5 MG tablet TAKE 1/2 OF A TABLET (0.25 MG TOTAL) BY MOUTH EVERY DAY 09/16/22   Mosie Lukes, MD  fluticasone (CUTIVATE) 0.05 % cream Apply 1 application topically daily as needed (scalp psoriasis). 03/20/18   [provider]  golimumab (SIMPONI ARIA) 50 MG/4ML SOLN injection Inject 50 mg into the vein See admin instructions. Given every 2 months - Last given 06/28/2019    [provider]  hydrochlorothiazide (HYDRODIURIL) 25 MG tablet Take 1 tablet (25 mg total) by mouth daily. 09/02/22   Mosie Lukes, MD  medroxyPROGESTERone (PROVERA) 2.5 MG tablet TAKE 0.5 TABLETS BY MOUTH DAILY. 09/16/22   Mosie Lukes, MD  melatonin 5 MG TABS Take 5 mg by mouth at bedtime.    [provider]  meloxicam (MOBIC) 15 MG tablet Take 15 mg by mouth daily as needed (joint pain (max 3-4 times/weekly)).    [provider]  Multiple Vitamin (MULTIVITAMIN WITH MINERALS) TABS tablet Take 1 tablet by mouth in the morning. Centrum Silver    [provider]  Polyethyl Glycol-Propyl Glycol (SYSTANE) 0.4-0.3 % SOLN Place 1-2 drops into both eyes 3 (three)  times daily as needed (dry/irritated eyes.).    [provider]  Probiotic Product (PROBIOTIC PO) Take 1 capsule by mouth in the morning and at bedtime. Restore Ultimate Probiotic    [provider]  umeclidinium-vilanterol (ANORO ELLIPTA) 62.5-25 MCG/ACT AEPB Inhale 1 puff into the lungs daily. 10/13/22   Hunsucker, Bonna Gains, MD      Allergies    Cleocin [clindamycin], Codeine, Hornet venom, Penicillins, Vibramycin [doxycycline], Yellow jacket venom, Lodine [etodolac], and Tramadol    Review of Systems   Review of Systems  Physical Exam Updated Vital Signs  ED Triage Vitals  Enc Vitals Group      BP 11/12/22 1621 115/74     Pulse Rate 11/12/22 1621 (!) 101     Resp 11/12/22 1621 18     Temp 11/12/22 1621 (!) 97.5 F (36.4 C)     Temp Source 11/12/22 1621 Oral     SpO2 11/12/22 1621 98 %     Weight 11/12/22 1620 111 lb (50.3 kg)     Height 11/12/22 1620 5' 2.5" (1.588 m)     Head Circumference --      Peak Flow --      Pain Score 11/12/22 1620 8     Pain Loc --      Pain Edu? --      Excl. in [place]? --     Physical Exam Vitals and nursing note reviewed.  Constitutional:      General: Stephanie [name] is not in acute distress.    Appearance: Stephanie [name] is well-developed.  HENT:     Head: Normocephalic and atraumatic.     Nose: Nose normal.     Mouth/Throat:     Mouth: Mucous membranes are moist.  Eyes:     Extraocular Movements: Extraocular movements intact.     Conjunctiva/sclera: Conjunctivae normal.     Pupils: Pupils are equal, round, and reactive to light.  Cardiovascular:     Rate and Rhythm: Normal rate and regular rhythm.     Pulses: Normal pulses.     Heart sounds: Normal heart sounds. No murmur heard. Pulmonary:     Effort: Pulmonary effort is normal. No respiratory distress.     Breath sounds: Normal breath sounds.  Abdominal:     Palpations: Abdomen is soft.     Tenderness: There is no abdominal tenderness.  Musculoskeletal:        General: No swelling.     Cervical back: Neck supple.  Skin:    General: Skin is warm and dry.     Capillary Refill: Capillary refill takes less than 2 seconds.  Neurological:     Mental Status: Stephanie [name] is alert.  Psychiatric:        Mood and Affect: Mood normal.     ED Results / Procedures / Treatments   Labs (all labs ordered are listed, but only abnormal results are displayed) Labs Reviewed  BASIC METABOLIC PANEL - Abnormal; Notable for the following components:      Result Value   Sodium 130 (*)    Potassium 2.6 (*)    Chloride 93 (*)    Glucose, Bld 115 (*)    All other components within normal limits  CBC    EKG EKG  shows sinus rhythm.  Heart rate 86, PR interval normal, QTc within normal limits.  No ischemic changes. Radiology No results found.  Procedures Procedures    Medications Ordered in ED Medications  0.9 %  sodium chloride infusion (0 mLs  Intravenous Stopped 11/12/22 1817)  potassium chloride 10 mEq in 100 mL IVPB (0 mEq Intravenous Stopped 11/12/22 1802)  potassium chloride SA (KLOR-CON M) CR tablet 40 mEq (40 mEq Oral Given by Other 11/12/22 1655)    ED Course/ Medical Decision Making/ A&P                           Medical Decision Making Risk Prescription drug management.   [name] is here for potassium repletion.  Stephanie [name] had a potassium of 2.9 at primary care doctor's office and was sent.  History of Parkinson's.  Was having viral type symptoms.  Had otherwise unremarkable labs including negative COVID and flu.  Stephanie [name] has been doing better about eating and drinking today.  Not having any more nausea.  No abdominal pain chest pain or shortness of breath.  Will give dose IV potassium and oral potassium and recheck BMP.  Anticipate discharge.  Recheck potassium is 2.6.  Otherwise labs are unremarkable.  Stephanie [name] is very well-appearing.  Stephanie [name] got both IV and oral repletion of potassium.  Her nausea and p.o. intake have greatly improved and overall suspect Stephanie [name] will continue to do well.  Will prescribe oral potassium and have her follow-up with her primary care doctor for recheck.  Stephanie [name] understands return if symptoms change.  Discharged in good condition.  This chart was dictated using voice recognition software.  Despite best efforts to proofread,  errors can occur which can change the documentation meaning.         Final Clinical Impression(s) / ED Diagnoses Final diagnoses:  Vomiting without nausea, unspecified vomiting type  Hypokalemia    Rx / DC Orders ED Discharge Orders          Ordered    potassium chloride 20 MEQ TBCR  2 times daily        11/12/22 [place], Salley, DO 11/12/22 1835

## 2022-11-18 ENCOUNTER — Other Ambulatory Visit: Payer: Self-pay

## 2022-11-18 ENCOUNTER — Ambulatory Visit: Payer: Medicare Other | Admitting: Physical Therapy

## 2022-11-18 ENCOUNTER — Telehealth: Payer: Self-pay | Admitting: Family Medicine

## 2022-11-18 DIAGNOSIS — E876 Hypokalemia: Secondary | ICD-10-CM

## 2022-11-18 NOTE — Therapy (Signed)
OUTPATIENT PHYSICAL THERAPY SHOULDER EVALUATION   Patient Name: Stephanie [name] MRN: [id number] DOB:September 16, 1932, 89 y.o., female Today's Date: 11/22/2022   END OF SESSION:  PT End of Session - 11/22/22 1400     Visit Number 1    Date for PT Re-Evaluation 01/17/23    Authorization Type Medicare & BCBS    PT Start Time 1400    PT Stop Time 1448    PT Time Calculation (min) 48 min    Activity Tolerance Patient tolerated treatment well    Behavior During Therapy WFL for tasks assessed/performed             Past Medical History:  Diagnosis Date   Allergy    Anemia    past hx    Arthropathy of cervical spine 11/27/2012   Right  Xray at [facility]  On 04/08/2016  AP, lateral, and lateral flexion and extension views of the cervical spine are submitted for evaluation.   No acute fracture identified in the cervical spine.   There is redemonstration of approximately 2 mm of C3 on C4 anterolisthesis in neutral which slightly increases in flexion relative to neutral and extension. Slight C5 on C6 retrolisthesis does not change i   Benign hypertension without congestive heart failure    Benign mole    right hcets mole, bleeds when dried with towel   Bilateral dry eyes    Bladder cancer ([place]) 14-Nov-2016   Cervical spondylosis    Cervicalgia    2 screws in place    Depression with anxiety November 14, 2016   prior to husbands death    Dyspnea    Gross hematuria    developed after first hip replacment, led to indicental finding of bladder tumor, bleeding now resolved    H/O total shoulder replacement, left    History of kidney stones    1970's   Left hand weakness    due to reinjury of flxor tendon s/p tendon surgery march 12-2018   Lumbar stenosis    Malignant tumor of urinary bladder ([place]) 07/2016   Pre-stage I   OA (osteoarthritis)    Parkinson disease dx 2012   neurologist-  dr siddiqui at [place])     Dr Trudie Reed , [place] [facility]    Rupture of flexor tendon of  hand 2020   right   Past Surgical History:  Procedure Laterality Date   BIOPSY MASS LEFT FIRST METACARPAL   06/23/2006   benign   CATARACT EXTRACTION W/ INTRAOCULAR LENS  IMPLANT, BILATERAL  1999 and 2003   CERVICAL FUSION  02/2017   c1-c2 ; reports she has 2 screws 2in long in place done at [facility] ; patient exhibits VERY LIMITED NECK ROM   COLONOSCOPY     CYSTOSCOPY W/ RETROGRADES Bilateral 09/22/2016   Procedure: CYSTOSCOPY WITH RETROGRADE PYELOGRAM;  Surgeon: Alexis Frock, MD;  Location: [facility];  Service: Urology;  Laterality: Bilateral;   D & C HYSTEROSCOPY W/ RESECTION POLYP  07/11/2000   DILATION AND CURETTAGE OF UTERUS  1960's   EXCISION CYST AND DEBRIDEMENT RIGHT WIRST AND REMOVAL FORGEIGN BODY  01/09/2009   FLEXOR TENDON REPAIR Left 12/12/2018   Procedure: REPAIR/TRANSFER FLEXOR DIGITORUM PROFUNDUS OF LEFT SMALL FINGER;  Surgeon: Daryll Brod, MD;  Location: [facility];  Service: Orthopedics;  Laterality: Left;   LAPAROSCOPY  yrs ago   infertility    METACARPOPHALANGEAL JOINT ARTHRODESIS Right 05/25/1996   REVERSE SHOULDER ARTHROPLASTY Left 10/14/2022  Procedure: REVERSE SHOULDER ARTHROPLASTY;  Surgeon: Justice Britain, MD;  Location: [facility];  Service: Orthopedics;  Laterality: Left;  17mn   TENDON REPAIR Left 01/15/2019   Hand   TONSILLECTOMY AND ADENOIDECTOMY  child   TOTAL HIP ARTHROPLASTY Left 07/07/2016   Procedure: LEFT TOTAL HIP ARTHROPLASTY ANTERIOR APPROACH;  Surgeon: FGaynelle Arabian MD;  Location: [facility];  Service: Orthopedics;  Laterality: Left;   TOTAL HIP ARTHROPLASTY Right 09/28/2017   Procedure: RIGHT TOTAL HIP ARTHROPLASTY ANTERIOR APPROACH;  Surgeon: AGaynelle Arabian MD;  Location: [facility];  Service: Orthopedics;  Laterality: Right;   TOTAL KNEE ARTHROPLASTY Right 07/30/2019   Procedure: TOTAL KNEE ARTHROPLASTY;  Surgeon: AGaynelle Arabian MD;  Location: [facility];  Service: Orthopedics;  Laterality: Right;  579m    TRANSURETHRAL RESECTION OF BLADDER TUMOR N/A 09/22/2016   Procedure: TRANSURETHRAL RESECTION OF BLADDER TUMOR (TURBT);  Surgeon: ThAlexis FrockMD;  [facility] Service: [facility];  Laterality: N/A;   Patient Active Problem List   Diagnosis Date Noted   S/P reverse total shoulder arthroplasty, left 10/14/2022   Preoperative cardiovascular examination 10/04/2022   SOB (shortness of breath) 08/02/2022   Insomnia 08/02/2022   Hyponatremia 08/01/2022   Shoulder pain, left 09/14/2021   History of COVID-19 01/06/2021   Diarrhea 09/03/2020   Vitamin D deficiency 09/03/2020   OA (osteoarthritis) of knee 07/30/2019   Tinea corporis 06/08/2019   Psoriatic arthritis ([place]/18/2020   Anemia 03/13/2018   Hot flashes 03/13/2018   S/P cervical spinal fusion 03/31/2017   Neck pain 12/12/2016   Depression with anxiety 10/17/2016   Bladder cancer ([facility]/01/2016   OA (osteoarthritis) of hip 07/07/2016   Spinal stenosis of lumbar region with radiculopathy 04/08/2016   Spondylolisthesis of lumbar region 04/08/2016   Spondylosis of cervical region without myelopathy or radiculopathy 04/08/2016   Preventative health care 01/18/2016   Low back pain 01/18/2016   History of chicken pox    Hyperlipidemia 12/15/2014   Allergic rhinitis 06/25/2014   Allergy to bee sting 06/10/2014   TMJ tenderness 11/16/2013   Other malaise and fatigue 05/07/2013   Arthropathy of cervical spine ([facility]/13/2014   Muscle spasms of neck 11/27/2012   Parkinson's disease 05/24/2012   Conjunctivochalasis 03/09/2012   Epiretinal membrane 03/09/2012   Hyperopia with astigmatism and presbyopia 03/09/2012   Pseudophakia 03/09/2012   HTN (hypertension) 08/31/2011   Right knee pain 08/31/2011   Benign hypertensive heart disease without heart failure 05/12/2011    PCP: BlMosie LukesMD   REFERRING PROVIDER: SuJustice BritainMD   REFERRING DIAG: [phone]-10-CM) - Presence of left artificial  shoulder joint   THERAPY DIAG:  Stiffness of left shoulder, not elsewhere classified  Chronic left shoulder pain  Abnormal posture  Muscle weakness (generalized)  RATIONALE FOR EVALUATION AND TREATMENT: Rehabilitation  ONSET DATE: 10/14/2022 - L reverse TSA  NEXT MD VISIT: 11/23/22   SUBJECTIVE:  SUBJECTIVE STATEMENT: Pt reports her shoulder has not been very painful, with her pain being mostly in the L side of her neck and shoulder blade. She reports she has been sleeping better since the surgery. She has difficulty reaching up with her L arm and has avoided reaching behind her back as directed with her post-op precautions. She was weaned from her sling after 3 weeks.  PAIN: Are you having pain? Yes: NPRS scale: 5/10 Pain location: L neck and shoulder blade Pain description: sharp and dull, constant, clicking Aggravating factors: constant Relieving factors: ice, heat, rest  PERTINENT HISTORY:  L shoulder advanced OA - reverse TSA on 10/14/22; R TKA 07/2019, B THR 2018 & 2017, posterior C1-2 cervical fusion 2018, rupture of flexor tendon of L hand with failed surgical repair 2020, Parkinson's, bladder cancer, HTN    PRECAUTIONS: Shoulder  WEIGHT BEARING RESTRICTIONS: No  FALLS:  Has patient fallen in last 6 months? No  LIVING ENVIRONMENT: Lives with: lives alone Lives in: House/apartment Stairs: Yes: External: 4 steps; on right going up, on left going up, and can reach both Has following equipment at home: Single point cane, Walker - 2 wheeled, shower chair, bed side commode, and Grab bars  OCCUPATION: Retired  PLOF: Independent and Leisure: volunteering at [employer], bible study, walking 1/3 mile daily  PATIENT GOALS: "To get rid of my pain and be able to do things with both  hands."   OBJECTIVE:   DIAGNOSTIC FINDINGS:  09/14/21 - L shoulder x-ray: IMPRESSION: 1. No acute bony abnormality. 2. Mild/moderate degenerative changes.  PATIENT SURVEYS:  Quick Dash 65.9 / 100 = 65.9 %  COGNITION: Overall cognitive status: Within functional limits for tasks assessed     SENSATION: WFL  POSTURE: rounded shoulders, forward head, weight shift right, and scoliosis  UPPER EXTREMITY ROM:   Active ROM Right eval Left eval  Shoulder flexion 152 89 *  Shoulder extension    Shoulder abduction 144 55 *  Shoulder adduction    Shoulder internal rotation    Shoulder external rotation 71 45 *  Elbow flexion    Elbow extension    Wrist flexion    Wrist extension    Wrist ulnar deviation    Wrist radial deviation    Wrist pronation    Wrist supination     Passive ROM Left eval  Shoulder flexion 91  Shoulder extension   Shoulder abduction 64  Shoulder adduction   Shoulder internal rotation 31 at 30 ABD  Shoulder external rotation   Elbow flexion   Elbow extension   Wrist flexion   Wrist extension   Wrist ulnar deviation   Wrist radial deviation   Wrist pronation   Wrist supination   (Blank rows = not tested) * = pain  UPPER EXTREMITY MMT:  (deferred on eval)  MMT Right eval Left eval  Shoulder flexion    Shoulder extension    Shoulder abduction    Shoulder adduction    Shoulder internal rotation    Shoulder external rotation    Middle trapezius    Lower trapezius    Elbow flexion    Elbow extension    Wrist flexion    Wrist extension    Wrist ulnar deviation    Wrist radial deviation    Wrist pronation    Wrist supination    Grip strength (lbs)    (Blank rows = not tested)  PALPATION:  TTP with taut bands and TPs in L posterolateral shoulder and pecs  TODAY'S TREATMENT:   11/22/22 THERAPEUTIC EXERCISE: to improve flexibility, strength and mobility.  Verbal and tactile cues throughout for technique.  Hooklying wand L  shoulder flexion AAROM x 10 Hooklying wand L shoulder ER AAROM with towel roll under upper arm x 10 Hooklying wand L shoulder ABD AAROM x 10 Seated wand L shoulder ER AAROM x 10 - better tolerated than supine Seated wand L shoulder ABD AAROM x 10 - better tolerated than supine   PATIENT EDUCATION:  Education details: PT eval findings, anticipated POC, and initial HEP  Person educated: Patient Education method: Explanation, Demonstration, and Handouts Education comprehension: verbalized understanding, returned demonstration, and needs further education  HOME EXERCISE PROGRAM: Access Code: [email or web address] URL: [email or web address] Date: 11/22/2022 Prepared by: Annie Paras  Exercises - Supine Shoulder Flexion AAROM with Dowel  - 2 x daily - 7 x weekly - 2 sets - 10 reps - 3 sec hold - Seated Shoulder Abduction AAROM with Dowel  - 2 x daily - 7 x weekly - 2 sets - 10 reps - 3 sec hold - Seated Shoulder External Rotation AAROM with Cane and Hand in Neutral (Mirrored)  - 2 x daily - 7 x weekly - 2 sets - 10 reps - 3 sec hold   ASSESSMENT:  CLINICAL IMPRESSION: Stephanie [name] is an 89 y.o. female who was seen today for physical therapy evaluation and treatment s/p L reverse TSA on 10/14/22. She reports her L shoulder pain has been mostly well controlled with main issues being pain in her neck and L scapula (longstanding and present prior to surgery). She has been careful with use of the L UE as instructed by the MD but notes limited ability to raise her arm. Current deficits including pain as above, painful and limited A/PROM of L shoulder, postural abnormalities with abnormal muscle tension in L pecs and posterolateral shoulder, L UE weakness and limited functional use of L UE. Stephanie [name] will benefit from skilled PT to address above deficits to improve functional L UE use and activity tolerance with decreased pain interference.   OBJECTIVE IMPAIRMENTS: decreased activity tolerance,  decreased mobility, decreased ROM, decreased strength, increased fascial restrictions, impaired perceived functional ability, increased muscle spasms, impaired flexibility, impaired UE functional use, improper body mechanics, postural dysfunction, and pain.   ACTIVITY LIMITATIONS: carrying, lifting, bed mobility, bathing, dressing, reach over head, and hygiene/grooming  PARTICIPATION LIMITATIONS: meal prep, cleaning, laundry, driving, shopping, community activity, and church  PERSONAL FACTORS: Age, Fitness, Past/current experiences, Time since onset of injury/illness/exacerbation, and 3+ comorbidities: R TKA 07/2019, B THR 2018 & 2017, posterior C1-2 cervical fusion 2018, rupture of flexor tendon of L hand with failed surgical repair 2020, Parkinson's, bladder cancer, HTN  are also affecting patient's functional outcome.   REHAB POTENTIAL: Good  CLINICAL DECISION MAKING: Evolving/moderate complexity  EVALUATION COMPLEXITY: Low   GOALS: Goals reviewed with patient? Yes  SHORT TERM GOALS: Target date: 12/20/2022  Patient will be independent with initial HEP to improve outcomes and carryover.  Baseline:  Goal status: INITIAL  2.  Patient will improve L shoulder P/AAROM to >/= 115 flexion, 90 abduction and 45 ER w/o increased pain to promote more functional L UE use.  Baseline:  Goal status: INITIAL  LONG TERM GOALS: Target date: 01/17/2023  Patient will be independent with ongoing/advanced HEP for self-management at home.  Baseline:  Goal status: INITIAL  2.  Patient will report 50-75% improvement in L shoulder pain to improve QOL.  Baseline:  Goal status: INITIAL  3.  Patient to demonstrate improved upright posture with posterior shoulder girdle engaged to promote improved glenohumeral joint mobility. Baseline:  Goal status: INITIAL  4.  Patient to improve L shoulder AROM to [facility] without pain provocation to allow for increased ease of ADLs.  Baseline:  Goal status:  INITIAL  5.  Patient will demonstrate improved L shoulder strength to >/= 4/5 for functional UE use. Baseline:  Goal status: INITIAL  6  Patient will report </= 55% on QuickDASH to demonstrate improved functional ability.  Baseline: 65.9 / 100 = 65.9 % Goal status: INITIAL   PLAN:  PT FREQUENCY: 2x/week  PT DURATION: 8 weeks  PLANNED INTERVENTIONS: Therapeutic exercises, Therapeutic activity, Neuromuscular re-education, Balance training, Gait training, Patient/Family education, Self Care, Joint mobilization, Dry Needling, Electrical stimulation, Cryotherapy, Moist heat, scar mobilization, Taping, Vasopneumatic device, Ionotophoresis '4mg'$ /ml Dexamethasone, Manual therapy, and Re-evaluation  PLAN FOR NEXT SESSION: L shoulder P/AAROM per protocol; L shoulder ABD & ER isometrics; supine A/AAROM "upper cut" & "hitchhiker"   Percival Spanish, PT 11/22/2022, 5:38 PM

## 2022-11-18 NOTE — Telephone Encounter (Signed)
Patient called to advise that the ED provider said she needs to have her potassium rechecked by her PCP. Order has not been placed for potassium. Patient did not think she should wait until 11/29/22. Please call patient to schedule lab when placed.

## 2022-11-18 NOTE — Telephone Encounter (Signed)
Called pt lab appt was made

## 2022-11-19 ENCOUNTER — Other Ambulatory Visit (INDEPENDENT_AMBULATORY_CARE_PROVIDER_SITE_OTHER): Payer: Medicare Other

## 2022-11-19 DIAGNOSIS — E876 Hypokalemia: Secondary | ICD-10-CM | POA: Diagnosis not present

## 2022-11-19 LAB — BASIC METABOLIC PANEL
BUN: 17 mg/dL (ref 6–23)
CO2: 31 mEq/L (ref 19–32)
Calcium: 9.6 mg/dL (ref 8.4–10.5)
Chloride: 94 mEq/L — ABNORMAL LOW (ref 96–112)
Creatinine, Ser: 0.79 mg/dL (ref 0.40–1.20)
GFR: 66.58 mL/min (ref >60.00)
Glucose, Bld: 121 mg/dL — ABNORMAL HIGH (ref 70–99)
Potassium: 3.7 mEq/L (ref 3.5–5.1)
Sodium: 133 mEq/L — ABNORMAL LOW (ref 135–145)

## 2022-11-22 ENCOUNTER — Other Ambulatory Visit: Payer: Self-pay

## 2022-11-22 ENCOUNTER — Ambulatory Visit: Payer: Medicare Other | Attending: Orthopedic Surgery | Admitting: Physical Therapy

## 2022-11-22 ENCOUNTER — Encounter: Payer: Self-pay | Admitting: Physical Therapy

## 2022-11-22 DIAGNOSIS — G8929 Other chronic pain: Secondary | ICD-10-CM | POA: Diagnosis not present

## 2022-11-22 DIAGNOSIS — M25512 Pain in left shoulder: Secondary | ICD-10-CM | POA: Diagnosis not present

## 2022-11-22 DIAGNOSIS — M6281 Muscle weakness (generalized): Secondary | ICD-10-CM | POA: Insufficient documentation

## 2022-11-22 DIAGNOSIS — M25612 Stiffness of left shoulder, not elsewhere classified: Secondary | ICD-10-CM | POA: Diagnosis not present

## 2022-11-22 DIAGNOSIS — R293 Abnormal posture: Secondary | ICD-10-CM | POA: Insufficient documentation

## 2022-11-24 DIAGNOSIS — Z96619 Presence of unspecified artificial shoulder joint: Secondary | ICD-10-CM | POA: Diagnosis not present

## 2022-11-24 DIAGNOSIS — Z96612 Presence of left artificial shoulder joint: Secondary | ICD-10-CM | POA: Diagnosis not present

## 2022-11-24 DIAGNOSIS — Z471 Aftercare following joint replacement surgery: Secondary | ICD-10-CM | POA: Diagnosis not present

## 2022-11-25 ENCOUNTER — Ambulatory Visit (INDEPENDENT_AMBULATORY_CARE_PROVIDER_SITE_OTHER): Payer: Medicare Other | Admitting: Pulmonary Disease

## 2022-11-25 ENCOUNTER — Encounter: Payer: Medicare Other | Admitting: Physical Therapy

## 2022-11-25 ENCOUNTER — Encounter: Payer: Self-pay | Admitting: Pulmonary Disease

## 2022-11-25 VITALS — BP 114/68 | HR 100 | Ht 62.5 in | Wt 111.0 lb

## 2022-11-25 DIAGNOSIS — R0609 Other forms of dyspnea: Secondary | ICD-10-CM

## 2022-11-25 LAB — PULMONARY FUNCTION TEST
DL/VA % pred: 99 %
DL/VA: 4.05 ml/min/mmHg/L
DLCO cor % pred: 85 %
DLCO cor: 14.94 ml/min/mmHg
DLCO unc % pred: 86 %
DLCO unc: 14.99 ml/min/mmHg
FEF 25-75 Post: 1.43 L/sec
FEF 25-75 Pre: 1.3 L/sec
FEF2575-%Change-Post: 10 %
FEF2575-%Pred-Post: 158 %
FEF2575-%Pred-Pre: 143 %
FEV1-%Change-Post: 3 %
FEV1-%Pred-Post: 116 %
FEV1-%Pred-Pre: 112 %
FEV1-Post: 1.77 L
FEV1-Pre: 1.7 L
FEV1FVC-%Change-Post: 1 %
FEV1FVC-%Pred-Pre: 104 %
FEV6-%Change-Post: 3 %
FEV6-%Pred-Post: 120 %
FEV6-%Pred-Pre: 116 %
FEV6-Post: 2.32 L
FEV6-Pre: 2.25 L
FEV6FVC-%Change-Post: 0 %
FEV6FVC-%Pred-Post: 107 %
FEV6FVC-%Pred-Pre: 106 %
FVC-%Change-Post: 2 %
FVC-%Pred-Post: 112 %
FVC-%Pred-Pre: 109 %
FVC-Post: 2.32 L
FVC-Pre: 2.26 L
Post FEV1/FVC ratio: 76 %
Post FEV6/FVC ratio: 100 %
Pre FEV1/FVC ratio: 75 %
Pre FEV6/FVC Ratio: 99 %
RV % pred: 130 %
RV: 3.24 L
TLC % pred: 112 %
TLC: 5.44 L

## 2022-11-25 NOTE — Patient Instructions (Signed)
Nice to see you again  Your pulmonary function test are largely normal, this is great news  I think it is okay to continue using the Anoro 1 puff once daily.  Use the current inhaler you have, if it is not helping you can stop it after you finish that inhaler.  If you find it is more helpful to anything let me know and I can continue to prescribe it  If after you stop the Anoro things are worse let me know and we can always go back on it.  Return to clinic as needed, if we find ongoing need for inhalers then I am happy to provide refills and arrange follow-up with me

## 2022-11-25 NOTE — Progress Notes (Signed)
$'@Patient'A$  ID: [name], female    DOB: [birth year], 89 y.o.   MRN: [id number]  Chief Complaint  Patient presents with   Follow-up    Pft review     Referring provider: Mosie Lukes, MD  HPI:   89 y.o. woman whom are seeing in follow up for evaluation of dyspnea on exertion.  Most recent cardiology note reviewed.  Most recent PCP note reviewed.  Discharge summary 10/2022 reviewed.  At last visit, prescribed Anoro.  She reports good clearances.  She does not think it helped much in terms of her shortness of breath.  Largely unchanged.  Otherwise doing fine.  PFTs performed today personally reviewed and interpreted as normal with exception of presence of air trapping.  We discussed the fact of bronchodilators in the setting.  However given the clinical improvement discussed stopping inhalers if no significant improvement once current inhaler has been completed.  If symptoms worsen after stopping inhaler can always resume.  Discussed reassurance of findings of pulmonary function test.   HPI at initial visit: Patient notes several month history of dyspnea exertion.  Worse on inclines or stairs.  Present on flat surfaces as well for longer distances.  No time of day when things are better or worse.  No position make things better or worse.  No seasonal or environmental factors she can identify to make things better or worse.  She has never used inhalers.    She denies any history of asthma.  She is a remote smoker.  Quit around 1965.  No history of shortness of breath or dyspnea exertion prior to the last few months.  In the past, as recent as 1 to 2 years ago, she was walking 2 miles a day.  This was a couple years ago.  Due to multiple orthopedic surgeries, other procedures she is while walking less now.  Only walked about a third of a mile before she can short of breath.  Reviewed most recent chest imaging chest x-ray 08/2022 that on my interpretation reveals hyperinflation, otherwise  clear lungs.  Recent echocardiogram 07/2022 reveals grade 1 diastolic dysfunction, mitral valve regurgitation, normal RA size, normal RV size, normal RV function, normal estimated right-sided pressures.   PMH: Parkinsonism, degenerative joint disease, hypertension Surgical history: Multiple orthopedic surgeries, Family history: Mother with hypertension, DVT, father with CVA, bladder cancer Social history: Former smoker, quit in 1965, lives in [place] / Pulmonary Flowsheets:   ACT:      No data to display           MMRC:     No data to display           Epworth:      No data to display           Tests:   FENO:  No results found for: "NITRICOXIDE"  PFT:    Latest Ref Rng & Units 11/25/2022   10:37 AM  PFT Results  FVC-Pre L 2.26  P  FVC-Predicted Pre % 109  P  FVC-Post L 2.32  P  FVC-Predicted Post % 112  P  Pre FEV1/FVC % % 75  P  Post FEV1/FCV % % 76  P  FEV1-Pre L 1.70  P  FEV1-Predicted Pre % 112  P  FEV1-Post L 1.77  P  DLCO uncorrected ml/min/mmHg 14.99  P  DLCO UNC% % 86  P  DLCO corrected ml/min/mmHg 14.94  P  DLCO COR %Predicted % 85  P  DLVA Predicted % 99  P  TLC L 5.44  P  TLC % Predicted % 112  P  RV % Predicted % 130  P    P Preliminary result   Personally reviewed interpret as normal spirometry.  No bronchodilator response.  Lung volumes consistent with air trapping.  DLCO within normal limits.  WALK:      No data to display           Imaging: Personally reviewed and as per EMR discussion in this note No results found.  Lab Results: Personally reviewed CBC    Component Value Date/Time   WBC 7.4 11/12/2022 1624   RBC 4.51 11/12/2022 1624   HGB 13.5 11/12/2022 1624   HGB 14.7 07/24/2010 1433   HCT 39.2 11/12/2022 1624   HCT 43.1 07/24/2010 1433   PLT 234 11/12/2022 1624   PLT 189 07/24/2010 1433   MCV 86.9 11/12/2022 1624   MCV 90.4 07/24/2010 1433   MCH 29.9 11/12/2022 1624   MCHC 34.4  11/12/2022 1624   RDW 13.0 11/12/2022 1624   RDW 12.7 07/24/2010 1433   LYMPHSABS 1,282 09/02/2020 1600   LYMPHSABS 0.9 07/24/2010 1433   MONOABS 0.5 07/19/2016 2231   MONOABS 0.4 07/24/2010 1433   EOSABS 110 09/02/2020 1600   EOSABS 0.1 07/24/2010 1433   BASOSABS 22 09/02/2020 1600   BASOSABS 0.0 07/24/2010 1433    BMET    Component Value Date/Time   NA 133 (L) 11/19/2022 1311   NA 140 05/30/2020 0000   K 3.7 11/19/2022 1311   CL 94 (L) 11/19/2022 1311   CO2 31 11/19/2022 1311   GLUCOSE 121 (H) 11/19/2022 1311   BUN 17 11/19/2022 1311   BUN 25 (A) 05/30/2020 0000   CREATININE 0.79 11/19/2022 1311   CREATININE 0.79 09/02/2020 1600   CALCIUM 9.6 11/19/2022 1311   GFRNONAA >60 11/12/2022 1624   GFRAA >60 07/31/2019 0307    BNP No results found for: "BNP"  ProBNP    Component Value Date/Time   PROBNP 284 10/05/2022 1143    Specialty Problems       Pulmonary Problems   Allergic rhinitis   SOB (shortness of breath)    Allergies  Allergen Reactions   Cleocin [Clindamycin] Diarrhea   Codeine Nausea And Vomiting   Hornet Venom    Penicillins Itching and Swelling    Has patient had a PCN reaction causing immediate rash, facial/tongue/throat swelling, SOB or lightheadedness with hypotension: Yes Has patient had a PCN reaction causing severe rash involving mucus membranes or skin necrosis: No Has patient had a PCN reaction that required hospitalization: No Has patient had a PCN reaction occurring within the last 10 years: No If all of the above answers are "NO", then may proceed with Cephalosporin use.    Vibramycin [Doxycycline] Diarrhea   Yellow Jacket Venom Swelling   Lodine [Etodolac] Rash   Tramadol Itching and Rash    Immunization History  Administered Date(s) Administered   Fluad Quad(high Dose 65+) 07/30/2022   Influenza Split 06/29/2019   Influenza Whole 08/15/2012   Influenza, High Dose Seasonal PF 08/19/2015, 08/27/2017, 08/15/2018, 07/12/2020,  07/29/2021   Influenza,inj,quad, With Preservative 08/15/2017, 08/15/2018   Influenza-Unspecified 08/15/2014, 08/24/2016, 08/15/2018   PFIZER Comirnaty(Gray Top)Covid-19 Tri-Sucrose Vaccine 05/15/2021   PFIZER(Purple Top)SARS-COV-2 Vaccination 11/25/2019, 12/16/2019, 08/19/2020   Pfizer Covid-19 Vaccine Bivalent Booster 90yr & up 10/14/2021   Pneumococcal Conjugate PCV 7 11/18/2014   Pneumococcal Conjugate-13 12/11/2014   Pneumococcal Polysaccharide-23 11/24/2011   Pneumococcal-Unspecified  11/18/2014   Td 02/25/2014   Unspecified SARS-COV-2 Vaccination 08/06/2022   Zoster Recombinat (Shingrix) 01/20/2018, 04/07/2018   Zoster, Live 08/27/2011   Zoster, Unspecified 04/07/2018    Past Medical History:  Diagnosis Date   Allergy    Anemia    past hx    Arthropathy of cervical spine 11/27/2012   Right  Xray at [facility]  On 04/08/2016  AP, lateral, and lateral flexion and extension views of the cervical spine are submitted for evaluation.   No acute fracture identified in the cervical spine.   There is redemonstration of approximately 2 mm of C3 on C4 anterolisthesis in neutral which slightly increases in flexion relative to neutral and extension. Slight C5 on C6 retrolisthesis does not change i   Benign hypertension without congestive heart failure    Benign mole    right hcets mole, bleeds when dried with towel   Bilateral dry eyes    Bladder cancer (HCC) 10-22-16   Cervical spondylosis    Cervicalgia    2 screws in place    Depression with anxiety 10/22/2016   prior to husbands death    Dyspnea    Gross hematuria    developed after first hip replacment, led to indicental finding of bladder tumor, bleeding now resolved    H/O total shoulder replacement, left    History of kidney stones    1970's   Left hand weakness    due to reinjury of flxor tendon s/p tendon surgery march 12-2018   Lumbar stenosis    Malignant tumor of urinary bladder ([place]) 07/2016   Pre-stage I   OA  (osteoarthritis)    Parkinson disease dx 2012   neurologist-  dr siddiqui at [place] [facility])     Dr Trudie Reed , [place] [facility]    Rupture of flexor tendon of hand 2020   right    Tobacco History: Social History   Tobacco Use  Smoking Status Former   Years: 10.00   Types: Cigarettes   Quit date: 05/04/1964   Years since quitting: 58.6  Smokeless Tobacco Never  Tobacco Comments   Pt states that when she was smoking 1 pack of cigs would last her 3 days. 10/13/22 ALS    Counseling given: Not Answered Tobacco comments: Pt states that when she was smoking 1 pack of cigs would last her 3 days. 10/13/22 ALS    Continue to not smoke  Outpatient Encounter Medications as of 11/25/2022  Medication Sig   amLODipine (NORVASC) 5 MG tablet TAKE 1 TABLET (5 MG TOTAL) BY MOUTH DAILY.   Apoaequorin (PREVAGEN PO) Take 1 tablet by mouth in the morning.   carbidopa-levodopa (SINEMET IR) 25-100 MG per tablet Take 0.5-1 tablets by mouth See admin instructions. Take 1 tablet by mouth in the morning upon waking & take 0.5 tablet by mouth every 2 hours thereafter for a total of 6 tablets daily.   Carbidopa-Levodopa ER (SINEMET CR) 25-100 MG tablet controlled release Take 1 tablet by mouth at bedtime.   cholecalciferol (VITAMIN D3) 25 MCG (1000 UNIT) tablet Take 1,000 Units by mouth in the morning.   Coenzyme Q10 (COQ10 PO) Take 1 tablet by mouth in the morning.   diazepam (VALIUM) 5 MG tablet TAKE 1 TABLET (5 MG TOTAL) BY MOUTH EVERY 12 (TWELVE) HOURS AS NEEDED. FOR ANXIETY   DIGESTIVE ENZYMES PO Take 1 capsule by mouth 3 (three) times daily before meals. Active Enzymes by [employer]   EPINEPHrine 0.3  mg/0.3 mL IJ SOAJ injection Inject 0.3 mg into the muscle as needed for anaphylaxis.   estradiol (ESTRACE) 0.5 MG tablet TAKE 1/2 OF A TABLET (0.25 MG TOTAL) BY MOUTH EVERY DAY   fluticasone (CUTIVATE) 0.05 % cream Apply 1 application topically daily as needed (scalp psoriasis).    golimumab (SIMPONI ARIA) 50 MG/4ML SOLN injection Inject 50 mg into the vein See admin instructions. Given every 2 months - Last given 06/28/2019   hydrochlorothiazide (HYDRODIURIL) 25 MG tablet Take 1 tablet (25 mg total) by mouth daily.   medroxyPROGESTERone (PROVERA) 2.5 MG tablet TAKE 0.5 TABLETS BY MOUTH DAILY.   melatonin 5 MG TABS Take 5 mg by mouth at bedtime.   meloxicam (MOBIC) 15 MG tablet Take 15 mg by mouth daily as needed (joint pain (max 3-4 times/weekly)).   Multiple Vitamin (MULTIVITAMIN WITH MINERALS) TABS tablet Take 1 tablet by mouth in the morning. Centrum Silver   Polyethyl Glycol-Propyl Glycol (SYSTANE) 0.4-0.3 % SOLN Place 1-2 drops into both eyes 3 (three) times daily as needed (dry/irritated eyes.).   Probiotic Product (PROBIOTIC PO) Take 1 capsule by mouth in the morning and at bedtime. Restore Ultimate Probiotic   umeclidinium-vilanterol (ANORO ELLIPTA) 62.5-25 MCG/ACT AEPB Inhale 1 puff into the lungs daily.   potassium chloride 20 MEQ TBCR Take 40 mEq by mouth 2 (two) times daily for 4 days.   No facility-administered encounter medications on file as of 11/25/2022.     Review of Systems  Review of Systems  N/a Physical Exam  BP 114/68 (BP Location: Left Arm, Patient Position: Sitting, Cuff Size: Normal)   Pulse 100   Ht 5' 2.5" (1.588 m)   Wt 111 lb (50.3 kg)   LMP 01/13/2013 Comment: spotting-had benign endometrial biopsy   SpO2 99%   BMI 19.98 kg/m   Wt Readings from Last 5 Encounters:  11/25/22 111 lb (50.3 kg)  11/12/22 111 lb (50.3 kg)  11/11/22 111 lb (50.3 kg)  10/14/22 115 lb 12.8 oz (52.5 kg)  10/13/22 115 lb 12.8 oz (52.5 kg)    BMI Readings from Last 5 Encounters:  11/25/22 19.98 kg/m  11/12/22 19.98 kg/m  11/11/22 19.98 kg/m  10/14/22 21.18 kg/m  10/13/22 21.18 kg/m     Physical Exam General: Sitting in chair, frail, elderly Eyes: EOMI, no icterus Neck: Supple, JVP Pulmonary: Clear, no work of breathing Cardiovascular:  Warm, no edema Abdomen: Nondistended, soft present MSK: No synovitis, joint effusion Neuro: Slowed gait, sensation intact Psych: Normal mood, full affect   Assessment & Plan:   Dyspnea on exertion: Suspect multifactorial.  She is less active over the last couple years, deconditioning likely contributing.  In addition, she has grade 1 diastolic dysfunction and mitral valve regurgitation, suspect these worsen with normal physiology of exercise and increase in blood pressure.  She is likely going gradually worsening neuromuscular weakness in the setting of parkinsonism.  Lastly, she has hyperinflation on chest x-ray begs a question underlying asthma.  PFTs confirm air trapping, but are otherwise normal, no other pulmonary contributor to symptoms.  Trial of Anoro for bronchodilation has not yielded improvement in symptoms.  As such, likely another driver of her symptoms unrelated to pulmonary.  Okay to stop Anoro, if symptoms worsen need to resume and contact me so we can continue to follow-up.  If symptoms unchanged, no further follow-up needed.    Return if symptoms worsen or fail to improve.   Lanier Clam, MD 11/25/2022   This appointment required 40 minutes  of patient care (this includes precharting, chart review, review of results, face-to-face care, etc.).

## 2022-11-25 NOTE — Progress Notes (Signed)
PFT done today. 

## 2022-11-26 ENCOUNTER — Ambulatory Visit: Payer: Medicare Other | Admitting: Physical Therapy

## 2022-11-26 ENCOUNTER — Encounter: Payer: Self-pay | Admitting: Physical Therapy

## 2022-11-26 DIAGNOSIS — G8929 Other chronic pain: Secondary | ICD-10-CM | POA: Diagnosis not present

## 2022-11-26 DIAGNOSIS — M6281 Muscle weakness (generalized): Secondary | ICD-10-CM | POA: Diagnosis not present

## 2022-11-26 DIAGNOSIS — M25512 Pain in left shoulder: Secondary | ICD-10-CM | POA: Diagnosis not present

## 2022-11-26 DIAGNOSIS — M25612 Stiffness of left shoulder, not elsewhere classified: Secondary | ICD-10-CM

## 2022-11-26 DIAGNOSIS — R293 Abnormal posture: Secondary | ICD-10-CM | POA: Diagnosis not present

## 2022-11-26 NOTE — Therapy (Signed)
OUTPATIENT PHYSICAL THERAPY TREATMENT   Patient Name: Stephanie [name] MRN: [id number] DOB:1932/08/17, 89 y.o., female Today's Date: 11/26/2022   END OF SESSION:  PT End of Session - 11/26/22 0931     Visit Number 2    Date for PT Re-Evaluation 01/17/23    Authorization Type Medicare & BCBS    PT Start Time [phone]    PT Stop Time 1029    PT Time Calculation (min) 58 min    Activity Tolerance Patient tolerated treatment well    Behavior During Therapy [facility] for tasks assessed/performed             Past Medical History:  Diagnosis Date   Allergy    Anemia    past hx    Arthropathy of cervical spine 11/27/2012   Right  Xray at [facility]  On 04/08/2016  AP, lateral, and lateral flexion and extension views of the cervical spine are submitted for evaluation.   No acute fracture identified in the cervical spine.   There is redemonstration of approximately 2 mm of C3 on C4 anterolisthesis in neutral which slightly increases in flexion relative to neutral and extension. Slight C5 on C6 retrolisthesis does not change i   Benign hypertension without congestive heart failure    Benign mole    right hcets mole, bleeds when dried with towel   Bilateral dry eyes    Bladder cancer ([place]) October 19, 2016   Cervical spondylosis    Cervicalgia    2 screws in place    Depression with anxiety 2016-10-19   prior to husbands death    Dyspnea    Gross hematuria    developed after first hip replacment, led to indicental finding of bladder tumor, bleeding now resolved    H/O total shoulder replacement, left    History of kidney stones    1970's   Left hand weakness    due to reinjury of flxor tendon s/p tendon surgery march 12-2018   Lumbar stenosis    Malignant tumor of urinary bladder ([place]) 07/2016   Pre-stage I   OA (osteoarthritis)    Parkinson disease dx 2012   neurologist-  dr siddiqui at [place] [facility])     Dr Trudie Reed , [place] [facility]    Rupture of flexor tendon of hand 2020    right   Past Surgical History:  Procedure Laterality Date   BIOPSY MASS LEFT FIRST METACARPAL   06/23/2006   benign   CATARACT EXTRACTION W/ INTRAOCULAR LENS  IMPLANT, BILATERAL  1999 and 2003   CERVICAL FUSION  02/2017   c1-c2 ; reports she has 2 screws 2in long in place done at [facility] ; patient exhibits VERY LIMITED NECK ROM   COLONOSCOPY     CYSTOSCOPY W/ RETROGRADES Bilateral 09/22/2016   Procedure: CYSTOSCOPY WITH RETROGRADE PYELOGRAM;  Surgeon: Alexis Frock, MD;  Location: [facility];  Service: Urology;  Laterality: Bilateral;   D & C HYSTEROSCOPY W/ RESECTION POLYP  07/11/2000   DILATION AND CURETTAGE OF UTERUS  1960's   EXCISION CYST AND DEBRIDEMENT RIGHT WIRST AND REMOVAL FORGEIGN BODY  01/09/2009   FLEXOR TENDON REPAIR Left 12/12/2018   Procedure: REPAIR/TRANSFER FLEXOR DIGITORUM PROFUNDUS OF LEFT SMALL FINGER;  Surgeon: Daryll Brod, MD;  Location: [place];  Service: Orthopedics;  Laterality: Left;   LAPAROSCOPY  yrs ago   infertility    METACARPOPHALANGEAL JOINT ARTHRODESIS Right 05/25/1996   REVERSE SHOULDER ARTHROPLASTY Left 10/14/2022  Procedure: REVERSE SHOULDER ARTHROPLASTY;  Surgeon: Justice Britain, MD;  Location: [facility];  Service: Orthopedics;  Laterality: Left;  127mn   TENDON REPAIR Left 01/15/2019   Hand   TONSILLECTOMY AND ADENOIDECTOMY  child   TOTAL HIP ARTHROPLASTY Left 07/07/2016   Procedure: LEFT TOTAL HIP ARTHROPLASTY ANTERIOR APPROACH;  Surgeon: FGaynelle Arabian MD;  Location: [facility];  Service: Orthopedics;  Laterality: Left;   TOTAL HIP ARTHROPLASTY Right 09/28/2017   Procedure: RIGHT TOTAL HIP ARTHROPLASTY ANTERIOR APPROACH;  Surgeon: AGaynelle Arabian MD;  Location: [facility];  Service: Orthopedics;  Laterality: Right;   TOTAL KNEE ARTHROPLASTY Right 07/30/2019   Procedure: TOTAL KNEE ARTHROPLASTY;  Surgeon: AGaynelle Arabian MD;  Location: [facility];  Service: Orthopedics;  Laterality: Right;  514m   TRANSURETHRAL  RESECTION OF BLADDER TUMOR N/A 09/22/2016   Procedure: TRANSURETHRAL RESECTION OF BLADDER TUMOR (TURBT);  Surgeon: ThAlexis FrockMD;  [facility] Service: [facility];  Laterality: N/A;   Patient Active Problem List   Diagnosis Date Noted   S/P reverse total shoulder arthroplasty, left 10/14/2022   Preoperative cardiovascular examination 10/04/2022   SOB (shortness of breath) 08/02/2022   Insomnia 08/02/2022   Hyponatremia 08/01/2022   Shoulder pain, left 09/14/2021   History of COVID-19 01/06/2021   Diarrhea 09/03/2020   Vitamin D deficiency 09/03/2020   OA (osteoarthritis) of knee 07/30/2019   Tinea corporis 06/08/2019   Psoriatic arthritis ([facility]05/18/2020   Anemia 03/13/2018   Hot flashes 03/13/2018   S/P cervical spinal fusion 03/31/2017   Neck pain 12/12/2016   Depression with anxiety 10/17/2016   Bladder cancer ([facility]/01/2016   OA (osteoarthritis) of hip 07/07/2016   Spinal stenosis of lumbar region with radiculopathy 04/08/2016   Spondylolisthesis of lumbar region 04/08/2016   Spondylosis of cervical region without myelopathy or radiculopathy 04/08/2016   Preventative health care 01/18/2016   Low back pain 01/18/2016   History of chicken pox    Hyperlipidemia 12/15/2014   Allergic rhinitis 06/25/2014   Allergy to bee sting 06/10/2014   TMJ tenderness 11/16/2013   Other malaise and fatigue 05/07/2013   Arthropathy of cervical spine ([place]/13/2014   Muscle spasms of neck 11/27/2012   Parkinson's disease 05/24/2012   Conjunctivochalasis 03/09/2012   Epiretinal membrane 03/09/2012   Hyperopia with astigmatism and presbyopia 03/09/2012   Pseudophakia 03/09/2012   HTN (hypertension) 08/31/2011   Right knee pain 08/31/2011   Benign hypertensive heart disease without heart failure 05/12/2011    PCP: BlMosie LukesMD   REFERRING PROVIDER: SuJustice BritainMD   REFERRING DIAG: [id number]-10-CM) - Presence of left artificial shoulder  joint   THERAPY DIAG:  Stiffness of left shoulder, not elsewhere classified  Chronic left shoulder pain  Abnormal posture  Muscle weakness (generalized)  RATIONALE FOR EVALUATION AND TREATMENT: Rehabilitation  ONSET DATE: 10/14/2022 - L reverse TSA  NEXT MD VISIT: ~01/04/23   SUBJECTIVE:  SUBJECTIVE STATEMENT: Pt reports had her post-op f/u with Dr. Onnie Graham this week - x-rays looked good and he was pleased with her motion.  PAIN: Are you having pain? Yes: NPRS scale:  5/10 Pain location: L neck Pain description: sharp and dull, constant, clicking Aggravating factors: constant Relieving factors: ice, heat, rest  PERTINENT HISTORY:  L shoulder advanced OA - reverse TSA on 10/14/22; R TKA 07/2019, B THR 2018 & 2017, posterior C1-2 cervical fusion 2018, rupture of flexor tendon of L hand with failed surgical repair 2020, Parkinson's, bladder cancer, HTN    PRECAUTIONS: Shoulder  WEIGHT BEARING RESTRICTIONS: No  FALLS:  Has patient fallen in last 6 months? No  LIVING ENVIRONMENT: Lives with: lives alone Lives in: House/apartment Stairs: Yes: External: 4 steps; on right going up, on left going up, and can reach both Has following equipment at home: Single point cane, Walker - 2 wheeled, shower chair, bed side commode, and Grab bars  OCCUPATION: Retired  PLOF: Independent and Leisure: volunteering at [employer], bible study, walking 1/3 mile daily  PATIENT GOALS: "To get rid of my pain and be able to do things with both hands."   OBJECTIVE:   DIAGNOSTIC FINDINGS:  09/14/21 - L shoulder x-ray: IMPRESSION: 1. No acute bony abnormality. 2. Mild/moderate degenerative changes.  PATIENT SURVEYS:  Quick Dash 65.9 / 100 = 65.9 %  COGNITION: Overall cognitive status: Within  functional limits for tasks assessed     SENSATION: WFL  POSTURE: rounded shoulders, forward head, weight shift right, and scoliosis  UPPER EXTREMITY ROM:   Active ROM Right eval Left eval  Shoulder flexion 152 89 *  Shoulder extension    Shoulder abduction 144 55 *  Shoulder adduction    Shoulder internal rotation    Shoulder external rotation 71 45 *  Elbow flexion    Elbow extension    Wrist flexion    Wrist extension    Wrist ulnar deviation    Wrist radial deviation    Wrist pronation    Wrist supination     Passive ROM Left eval  Shoulder flexion 91  Shoulder extension   Shoulder abduction 64  Shoulder adduction   Shoulder internal rotation 31 at 30 ABD  Shoulder external rotation   Elbow flexion   Elbow extension   Wrist flexion   Wrist extension   Wrist ulnar deviation   Wrist radial deviation   Wrist pronation   Wrist supination   (Blank rows = not tested) * = pain  UPPER EXTREMITY MMT:  (deferred on eval)  MMT Right eval Left eval  Shoulder flexion    Shoulder extension    Shoulder abduction    Shoulder adduction    Shoulder internal rotation    Shoulder external rotation    Middle trapezius    Lower trapezius    Elbow flexion    Elbow extension    Wrist flexion    Wrist extension    Wrist ulnar deviation    Wrist radial deviation    Wrist pronation    Wrist supination    Grip strength (lbs)    (Blank rows = not tested)  PALPATION:  TTP with taut bands and TPs in L posterolateral shoulder and pecs    TODAY'S TREATMENT:   11/26/22 THERAPEUTIC EXERCISE: to improve flexibility, strength and mobility.  Verbal and tactile cues throughout for technique.  Pulleys: L shoulder flexion & scaption x 3 min each Seated wand L shoulder ER AAROM x 10  Seated  wand L shoulder ABD AAROM x 10  Seated L shoulder ER isometrics 10 x 5" - submax avoiding increased pain at scapula Seated L shoulder ABD isometrics 10 x 5"  Hooklying wand L shoulder  flexion AAROM x 10 Hooklying L shoulder flexion "upper cut" AROM x 10 Hooklying L shoulder abduction/scaption "hitch hiker" AROM x 10  MANUAL THERAPY: To promote normalized muscle tension, improved flexibility, and reduced pain. STM with attempted TPR to L UT - very limited tolerance  MODALITIES: IFC to L UT, 80-150 Hz, intensity to patient tolerance x 15 min  Moist heat to L upper shoulder/L lateral neck x 15 min   11/22/22 THERAPEUTIC EXERCISE: to improve flexibility, strength and mobility.  Verbal and tactile cues throughout for technique.  Hooklying wand L shoulder flexion AAROM x 10 Hooklying wand L shoulder ER AAROM with towel roll under upper arm x 10 Hooklying wand L shoulder ABD AAROM x 10 Seated wand L shoulder ER AAROM x 10 - better tolerated than supine Seated wand L shoulder ABD AAROM x 10 - better tolerated than supine   PATIENT EDUCATION:  Education details: HEP review - review of cervical retraction and UT stretch from prior PT episode Person educated: Patient Education method: Customer service manager Education comprehension: verbalized understanding and returned demonstration  HOME EXERCISE PROGRAM: Access Code: [email or web address] URL: [email or web address] Date: 11/22/2022 Prepared by: Annie Paras  Exercises - Supine Shoulder Flexion AAROM with Dowel  - 2 x daily - 7 x weekly - 2 sets - 10 reps - 3 sec hold - Seated Shoulder Abduction AAROM with Dowel  - 2 x daily - 7 x weekly - 2 sets - 10 reps - 3 sec hold - Seated Shoulder External Rotation AAROM with Cane and Hand in Neutral (Mirrored)  - 2 x daily - 7 x weekly - 2 sets - 10 reps - 3 sec hold   ASSESSMENT:  CLINICAL IMPRESSION: Alisan continues to deny L shoulder pain but notes ongoing chronic L sided neck pain. HEP reviewed with pt able to perform good return demonstration with only minor cues/corrections necessary. Introduced L shoulder isometrics with cues to limit effort so as to avoid  creating increased pain. Progressed to AROM with short arm L shoulder flexion and abduction in supine/hooklying. Pt tolerates most exercises well at the shoulder but remains limited at times by L-sided neck pain. Limited tolerance for manual STM/TPR, therefore session concluded with trial of estim and moist heat to reduce L-sided neck and upper shoulder pain.  OBJECTIVE IMPAIRMENTS: decreased activity tolerance, decreased mobility, decreased ROM, decreased strength, increased fascial restrictions, impaired perceived functional ability, increased muscle spasms, impaired flexibility, impaired UE functional use, improper body mechanics, postural dysfunction, and pain.   ACTIVITY LIMITATIONS: carrying, lifting, bed mobility, bathing, dressing, reach over head, and hygiene/grooming  PARTICIPATION LIMITATIONS: meal prep, cleaning, laundry, driving, shopping, community activity, and church  PERSONAL FACTORS: Age, Fitness, Past/current experiences, Time since onset of injury/illness/exacerbation, and 3+ comorbidities: R TKA 07/2019, B THR 2018 & 2017, posterior C1-2 cervical fusion 2018, rupture of flexor tendon of L hand with failed surgical repair 2020, Parkinson's, bladder cancer, HTN  are also affecting patient's functional outcome.   REHAB POTENTIAL: Good  CLINICAL DECISION MAKING: Evolving/moderate complexity  EVALUATION COMPLEXITY: Low   GOALS: Goals reviewed with patient? Yes  SHORT TERM GOALS: Target date: 12/20/2022  Patient will be independent with initial HEP to improve outcomes and carryover.  Baseline:  Goal status: IN PROGRESS  2.  Patient will improve L  shoulder P/AAROM to >/= 115 flexion, 90 abduction and 45 ER w/o increased pain to promote more functional L UE use.  Baseline:  Goal status: IN PROGRESS  LONG TERM GOALS: Target date: 01/17/2023  Patient will be independent with ongoing/advanced HEP for self-management at home.  Baseline:  Goal status: IN PROGRESS  2.   Patient will report 50-75% improvement in L shoulder pain to improve QOL.  Baseline:  Goal status: IN PROGRESS  3.  Patient to demonstrate improved upright posture with posterior shoulder girdle engaged to promote improved glenohumeral joint mobility. Baseline:  Goal status: IN PROGRESS  4.  Patient to improve L shoulder AROM to [facility] without pain provocation to allow for increased ease of ADLs.  Baseline:  Goal status: IN PROGRESS  5.  Patient will demonstrate improved L shoulder strength to >/= 4/5 for functional UE use. Baseline:  Goal status: IN PROGRESS  6  Patient will report </= 55% on QuickDASH to demonstrate improved functional ability.  Baseline: 65.9 / 100 = 65.9 % Goal status: IN PROGRESS   PLAN:  PT FREQUENCY: 2x/week  PT DURATION: 8 weeks  PLANNED INTERVENTIONS: Therapeutic exercises, Therapeutic activity, Neuromuscular re-education, Balance training, Gait training, Patient/Family education, Self Care, Joint mobilization, Dry Needling, Electrical stimulation, Cryotherapy, Moist heat, scar mobilization, Taping, Vasopneumatic device, Ionotophoresis '4mg'$ /ml Dexamethasone, Manual therapy, and Re-evaluation  PLAN FOR NEXT SESSION: L shoulder P/AAROM per protocol; L shoulder ABD & ER isometrics; supine AROM "upper cut" & "hitchhiker"; MT and modalities as needed/tolerated to address pain and abnormal muscle tension   Percival Spanish, PT 11/26/2022, 11:11 AM

## 2022-11-29 ENCOUNTER — Encounter: Payer: Self-pay | Admitting: Physical Therapy

## 2022-11-29 ENCOUNTER — Ambulatory Visit (INDEPENDENT_AMBULATORY_CARE_PROVIDER_SITE_OTHER): Payer: Medicare Other | Admitting: Family Medicine

## 2022-11-29 ENCOUNTER — Ambulatory Visit: Payer: Medicare Other | Admitting: Physical Therapy

## 2022-11-29 VITALS — BP 110/62 | HR 98 | Temp 98.0°F | Resp 16 | Ht 63.0 in | Wt 119.0 lb

## 2022-11-29 DIAGNOSIS — M6281 Muscle weakness (generalized): Secondary | ICD-10-CM

## 2022-11-29 DIAGNOSIS — M25612 Stiffness of left shoulder, not elsewhere classified: Secondary | ICD-10-CM

## 2022-11-29 DIAGNOSIS — I10 Essential (primary) hypertension: Secondary | ICD-10-CM | POA: Diagnosis not present

## 2022-11-29 DIAGNOSIS — G8929 Other chronic pain: Secondary | ICD-10-CM

## 2022-11-29 DIAGNOSIS — G20A1 Parkinson's disease without dyskinesia, without mention of fluctuations: Secondary | ICD-10-CM

## 2022-11-29 DIAGNOSIS — E559 Vitamin D deficiency, unspecified: Secondary | ICD-10-CM

## 2022-11-29 DIAGNOSIS — E7800 Pure hypercholesterolemia, unspecified: Secondary | ICD-10-CM | POA: Diagnosis not present

## 2022-11-29 DIAGNOSIS — R293 Abnormal posture: Secondary | ICD-10-CM | POA: Diagnosis not present

## 2022-11-29 DIAGNOSIS — M25512 Pain in left shoulder: Secondary | ICD-10-CM | POA: Diagnosis not present

## 2022-11-29 MED ORDER — TIZANIDINE HCL 2 MG PO TABS: 1.0000 mg | ORAL_TABLET | Freq: Two times a day (BID) | ORAL | 2 refills | Status: DC | PRN

## 2022-11-29 NOTE — Patient Instructions (Signed)
Sinus Tachycardia  Sinus tachycardia is a fast heartbeat. In sinus tachycardia, the heart beats more than 100 times a minute. Sinus tachycardia starts in the part of the heart called the sinoatrial (SA) node. Sinus tachycardia may be harmless, or it may be a sign of a serious condition. What are the causes? This condition may be caused by: Exercise or exertion. A fever. Pain. Loss of body fluids (dehydration). Severe bleeding (hemorrhage). Anxiety and stress. Certain substances, including: Alcohol. Caffeine. Tobacco and nicotine products. Cold medicines. Illegal drugs. Medical conditions including: Heart disease. An infection. An overactive thyroid (hyperthyroidism). A lack of red blood cells (anemia). What are the signs or symptoms? Symptoms of this condition include: A feeling that the heart is beating fast or unevenly (palpitations). Suddenly noticing your heartbeat (cardiac awareness). Lightheadedness. Tiredness (fatigue). Shortness of breath. Chest pain. Nausea. Fainting. How is this diagnosed? This condition is diagnosed with: A physical exam. Tests or monitoring, such as: Blood tests. An electrocardiogram (ECG). This test measures the electrical activity of the heart. Ambulatory cardiac monitor. This records your heartbeats for 24 hours or more. You may be referred to a heart specialist (cardiologist). How is this treated? Treatment for this condition depends on the cause. Treatment may involve: Treating the underlying condition. Taking new medicines or changing your current medicines as told by your health care provider. Making changes to your diet or lifestyle. Follow these instructions at home: Lifestyle  Do not use any products that contain nicotine or tobacco. These products include cigarettes, chewing tobacco, and vaping devices, such as e-cigarettes. If you need help quitting, ask your health care provider. Do not use illegal drugs, such as  cocaine. Learn relaxation methods to help you when you get stressed or anxious. These include deep breathing. Avoid caffeine or other stimulants, including herbal stimulants that are found in energy drinks. Alcohol use  Do not drink alcohol if: Your health care provider tells you not to drink. You are pregnant, may be pregnant, or are planning to become pregnant. If you drink alcohol: Limit how much you have to: 0-1 drink a day for women. 0-2 drinks a day for men. Know how much alcohol is in your drink. In the [place] one drink equals one 12 oz bottle of beer (355 mL), one 5 oz glass of wine (148 mL), or one 1 oz glass of hard liquor (44 mL). General instructions Drink enough fluids to keep your urine pale yellow. Take over-the-counter and prescription medicines only as told by your health care provider. Ask your health care provider about taking vitamins, herbs, and supplements. Contact a health care provider if: You have vomiting or diarrhea that does not go away. You have a fever. You have weakness or dizziness. You feel faint. Get help right away if: You have pain in your chest, upper arms, jaw, or neck. You have palpitations that do not go away. Summary In sinus tachycardia, the heart beats more than 100 times a minute. Sinus tachycardia may be harmless, or it may be a sign of a serious condition. Treatment for this condition depends on the cause or the underlying condition. Get help right away if you have pain in your chest, upper arms, jaw, or neck. This information is not intended to replace advice given to you by your health care provider. Make sure you discuss any questions you have with your health care provider. Document Revised: 03/02/2022 Document Reviewed: 03/02/2022 Elsevier Patient Education  2023 [employer]  

## 2022-11-29 NOTE — Therapy (Signed)
OUTPATIENT PHYSICAL THERAPY TREATMENT   Patient Name: Stephanie [name] MRN: [id number] DOB:10-24-1932, 89 y.o., female Today's Date: 11/29/2022   END OF SESSION:  PT End of Session - 11/29/22 1356     Visit Number 3    Date for PT Re-Evaluation 01/17/23    Authorization Type Medicare & BCBS    PT Start Time 1356    PT Stop Time 1503    PT Time Calculation (min) 67 min    Activity Tolerance Patient tolerated treatment well    Behavior During Therapy WFL for tasks assessed/performed             Past Medical History:  Diagnosis Date   Allergy    Anemia    past hx    Arthropathy of cervical spine 11/27/2012   Right  Xray at [facility]  On 04/08/2016  AP, lateral, and lateral flexion and extension views of the cervical spine are submitted for evaluation.   No acute fracture identified in the cervical spine.   There is redemonstration of approximately 2 mm of C3 on C4 anterolisthesis in neutral which slightly increases in flexion relative to neutral and extension. Slight C5 on C6 retrolisthesis does not change i   Benign hypertension without congestive heart failure    Benign mole    right hcets mole, bleeds when dried with towel   Bilateral dry eyes    Bladder cancer ([place]) 2016/11/11   Cervical spondylosis    Cervicalgia    2 screws in place    Depression with anxiety 11/11/16   prior to husbands death    Dyspnea    Gross hematuria    developed after first hip replacment, led to indicental finding of bladder tumor, bleeding now resolved    H/O total shoulder replacement, left    History of kidney stones    1970's   Left hand weakness    due to reinjury of flxor tendon s/p tendon surgery march 12-2018   Lumbar stenosis    Malignant tumor of urinary bladder ([place]) 07/2016   Pre-stage I   OA (osteoarthritis)    Parkinson disease dx 2012   neurologist-  dr siddiqui at [facility])     Dr Trudie Reed , [place] [facility]    Rupture of flexor tendon of hand 2020    right   Past Surgical History:  Procedure Laterality Date   BIOPSY MASS LEFT FIRST METACARPAL   06/23/2006   benign   CATARACT EXTRACTION W/ INTRAOCULAR LENS  IMPLANT, BILATERAL  1999 and 2003   CERVICAL FUSION  02/2017   c1-c2 ; reports she has 2 screws 2in long in place done at [facility] ; patient exhibits VERY LIMITED NECK ROM   COLONOSCOPY     CYSTOSCOPY W/ RETROGRADES Bilateral 09/22/2016   Procedure: CYSTOSCOPY WITH RETROGRADE PYELOGRAM;  Surgeon: Alexis Frock, MD;  Location: [facility];  Service: Urology;  Laterality: Bilateral;   D & C HYSTEROSCOPY W/ RESECTION POLYP  07/11/2000   DILATION AND CURETTAGE OF UTERUS  1960's   EXCISION CYST AND DEBRIDEMENT RIGHT WIRST AND REMOVAL FORGEIGN BODY  01/09/2009   FLEXOR TENDON REPAIR Left 12/12/2018   Procedure: REPAIR/TRANSFER FLEXOR DIGITORUM PROFUNDUS OF LEFT SMALL FINGER;  Surgeon: Daryll Brod, MD;  Location: [place];  Service: Orthopedics;  Laterality: Left;   LAPAROSCOPY  yrs ago   infertility    METACARPOPHALANGEAL JOINT ARTHRODESIS Right 05/25/1996   REVERSE SHOULDER ARTHROPLASTY Left 10/14/2022  Procedure: REVERSE SHOULDER ARTHROPLASTY;  Surgeon: Justice Britain, MD;  Location: [facility];  Service: Orthopedics;  Laterality: Left;  178mn   TENDON REPAIR Left 01/15/2019   Hand   TONSILLECTOMY AND ADENOIDECTOMY  child   TOTAL HIP ARTHROPLASTY Left 07/07/2016   Procedure: LEFT TOTAL HIP ARTHROPLASTY ANTERIOR APPROACH;  Surgeon: FGaynelle Arabian MD;  Location: [facility];  Service: Orthopedics;  Laterality: Left;   TOTAL HIP ARTHROPLASTY Right 09/28/2017   Procedure: RIGHT TOTAL HIP ARTHROPLASTY ANTERIOR APPROACH;  Surgeon: AGaynelle Arabian MD;  Location: [facility];  Service: Orthopedics;  Laterality: Right;   TOTAL KNEE ARTHROPLASTY Right 07/30/2019   Procedure: TOTAL KNEE ARTHROPLASTY;  Surgeon: AGaynelle Arabian MD;  Location: [facility];  Service: Orthopedics;  Laterality: Right;  513m   TRANSURETHRAL  RESECTION OF BLADDER TUMOR N/A 09/22/2016   Procedure: TRANSURETHRAL RESECTION OF BLADDER TUMOR (TURBT);  Surgeon: ThAlexis FrockMD;  [facility] Service: [facility];  Laterality: N/A;   Patient Active Problem List   Diagnosis Date Noted   S/P reverse total shoulder arthroplasty, left 10/14/2022   Preoperative cardiovascular examination 10/04/2022   SOB (shortness of breath) 08/02/2022   Insomnia 08/02/2022   Hyponatremia 08/01/2022   Shoulder pain, left 09/14/2021   History of COVID-19 01/06/2021   Diarrhea 09/03/2020   Vitamin D deficiency 09/03/2020   OA (osteoarthritis) of knee 07/30/2019   Tinea corporis 06/08/2019   [place]oriatic [place] ([place]/18/[place]   Anemia 03/13/2018   Hot flashes 03/13/2018   S/P cervical spinal fusion 03/31/2017   Neck pain 12/12/2016   Depression with anxiety 10/17/2016   [place]   OA (osteoarthritis) of hip 07/07/2016   Spinal stenosis of lumbar region with radiculopathy 04/08/2016   Spondylolisthesis of lumbar region 04/08/2016   Spondylosis of cervical region without myelopathy or radiculopathy 04/08/2016   Preventative health care 01/18/2016   Low back pain 01/18/2016   History of chicken pox    Hyperlipidemia 12/15/2014   Allergic rhinitis 06/25/2014   Allergy to bee sting 06/10/2014   TMJ tenderness 11/16/2013   Other malaise and fatigue 05/07/2013   Arthropathy of cervical spine ([place]/13/2014   Muscle spasms of neck 11/27/2012   Parkinson's disease 05/24/2012   Conjunctivochalasis 03/09/2012   Epiretinal membrane 03/09/2012   Hyperopia with astigmatism and presbyopia 03/09/2012   Pseudophakia 03/09/2012   HTN (hypertension) 08/31/2011   Right knee pain 08/31/2011   Benign hypertensive heart disease without heart failure 05/12/2011    PCP: BlMosie LukesMD   REFERRING PROVIDER: SuJustice BritainMD   REFERRING DIAG: [id number]-10-CM) - Presence of left artificial shoulder  joint   THERAPY DIAG:  Stiffness of left shoulder, not elsewhere classified  Chronic left shoulder pain  Abnormal posture  Muscle weakness (generalized)  RATIONALE FOR EVALUATION AND TREATMENT: Rehabilitation  ONSET DATE: 10/14/2022 - L reverse TSA  NEXT MD VISIT: ~01/04/23   SUBJECTIVE:  SUBJECTIVE STATEMENT: Pt reports increased pain in the L side of her neck and upper shoulder since waking today. She saw her PCP for an annual visit today and states the MD prescribed a muscle relaxant to take at bedtime. She also thinks that the estim utilized last visit seemed to help some. She is going for her psoriatic arthritis infusion   PAIN: Are you having pain? Yes: NPRS scale:  7/10 Pain location: L neck & upper shoulder Pain description: sharp and dull, constant, clicking Aggravating factors: constant Relieving factors: ice, heat, rest  PERTINENT HISTORY:  L shoulder advanced OA - reverse TSA on 10/14/22; R TKA 07/2019, B THR 2018 & 2017, posterior C1-2 cervical fusion 2018, rupture of flexor tendon of L hand with failed surgical repair 2020, Parkinson's, bladder cancer, HTN    PRECAUTIONS: Shoulder  WEIGHT BEARING RESTRICTIONS: No  FALLS:  Has patient fallen in last 6 months? No  LIVING ENVIRONMENT: Lives with: lives alone Lives in: House/apartment Stairs: Yes: External: 4 steps; on right going up, on left going up, and can reach both Has following equipment at home: Single point cane, Walker - 2 wheeled, shower chair, bed side commode, and Grab bars  OCCUPATION: Retired  PLOF: Independent and Leisure: volunteering at [employer], bible study, walking 1/3 mile daily  PATIENT GOALS: "To get rid of my pain and be able to do things with both hands."   OBJECTIVE:   DIAGNOSTIC  FINDINGS:  09/14/21 - L shoulder x-ray: IMPRESSION: 1. No acute bony abnormality. 2. Mild/moderate degenerative changes.  PATIENT SURVEYS:  Quick Dash 65.9 / 100 = 65.9 %  COGNITION: Overall cognitive status: Within functional limits for tasks assessed     SENSATION: WFL  POSTURE: rounded shoulders, forward head, weight shift right, and scoliosis  UPPER EXTREMITY ROM:   Active ROM Right eval Left eval  Shoulder flexion 152 89 *  Shoulder extension    Shoulder abduction 144 55 *  Shoulder adduction    Shoulder internal rotation    Shoulder external rotation 71 45 *  Elbow flexion    Elbow extension    Wrist flexion    Wrist extension    Wrist ulnar deviation    Wrist radial deviation    Wrist pronation    Wrist supination     Passive ROM Left eval  Shoulder flexion 91  Shoulder extension   Shoulder abduction 64  Shoulder adduction   Shoulder internal rotation 31 at 30 ABD  Shoulder external rotation   Elbow flexion   Elbow extension   Wrist flexion   Wrist extension   Wrist ulnar deviation   Wrist radial deviation   Wrist pronation   Wrist supination   (Blank rows = not tested) * = pain  UPPER EXTREMITY MMT:  (deferred on eval)  MMT Right eval Left eval  Shoulder flexion    Shoulder extension    Shoulder abduction    Shoulder adduction    Shoulder internal rotation    Shoulder external rotation    Middle trapezius    Lower trapezius    Elbow flexion    Elbow extension    Wrist flexion    Wrist extension    Wrist ulnar deviation    Wrist radial deviation    Wrist pronation    Wrist supination    Grip strength (lbs)    (Blank rows = not tested)  PALPATION:  TTP with taut bands and TPs in L posterolateral shoulder and pecs    TODAY'S  TREATMENT:   11/29/22 THERAPEUTIC EXERCISE: to improve flexibility, strength and mobility.  Verbal and tactile cues throughout for technique.  Pulleys: L shoulder flexion & scaption x 3 min each Seated  L shoulder ER isometrics 10 x 5" - submax avoiding increased pain at scapula/neck Seated L shoulder ABD isometrics 10 x 5"  Seated B scapular retraction 10 x 3", 2 sets Seated L shoulder ABD rollout with blue/green on mat table 10 x 3" Hooklying L shoulder flexion "upper cut" AROM 2 x 10 Hooklying L shoulder abduction/scaption "hitch hiker" AROM 2 x 10 Standing pendulums: Flexion/extension x 30" Horizontal ABD/ADD x 30" CW/CCW x 30" each Standing L shoulder ER isometric - attempted at doorframe but deferred 2 increased pain   MANUAL THERAPY: To promote normalized muscle tension and reduced pain. Palpation identifying increased TTP over L infraspinatus, biceps and UT/LS  MODALITIES: IFC to L shoulder complex, 80-150 Hz, intensity to patient tolerance x 15 min  Moist heat to L upper shoulder/L lateral neck x 15 min   11/26/22 THERAPEUTIC EXERCISE: to improve flexibility, strength and mobility.  Verbal and tactile cues throughout for technique.  Pulleys: L shoulder flexion & scaption x 3 min each Seated wand L shoulder ER AAROM x 10  Seated wand L shoulder ABD AAROM x 10  Seated L shoulder ER isometrics 10 x 5" - submax avoiding increased pain at scapula Seated L shoulder ABD isometrics 10 x 5"  Hooklying wand L shoulder flexion AAROM x 10 Hooklying L shoulder flexion "upper cut" AROM x 10 Hooklying L shoulder abduction/scaption "hitch hiker" AROM x 10  MANUAL THERAPY: To promote normalized muscle tension, improved flexibility, and reduced pain. STM with attempted TPR to L UT - very limited tolerance  MODALITIES: IFC to L UT, 80-150 Hz, intensity to patient tolerance x 15 min  Moist heat to L upper shoulder/L lateral neck x 15 min   11/22/22 THERAPEUTIC EXERCISE: to improve flexibility, strength and mobility.  Verbal and tactile cues throughout for technique.  Hooklying wand L shoulder flexion AAROM x 10 Hooklying wand L shoulder ER AAROM with towel roll under upper arm x  10 Hooklying wand L shoulder ABD AAROM x 10 Seated wand L shoulder ER AAROM x 10 - better tolerated than supine Seated wand L shoulder ABD AAROM x 10 - better tolerated than supine   PATIENT EDUCATION:  Education details: HEP review - review of cervical retraction and UT stretch from prior PT episode Person educated: Patient Education method: Customer service manager Education comprehension: verbalized understanding and returned demonstration   HOME EXERCISE PROGRAM: Access Code: [email or web address] URL: [email or web address] Date: 11/29/2022 Prepared by: Annie Paras  Exercises - Supine Shoulder Flexion AAROM with Dowel  - 2 x daily - 7 x weekly - 2 sets - 10 reps - 3 sec hold - Seated Shoulder Abduction AAROM with Dowel  - 2 x daily - 7 x weekly - 2 sets - 10 reps - 3 sec hold - Seated Shoulder External Rotation AAROM with Cane and Hand in Neutral (Mirrored)  - 2 x daily - 7 x weekly - 2 sets - 10 reps - 3 sec hold - Isometric Shoulder External Rotation  - 1 x daily - 7 x weekly - 2 sets - 10 reps - 5 sec hold - Isometric Shoulder Abduction  - 1 x daily - 7 x weekly - 2 sets - 10 reps - 3 sec hold   ASSESSMENT:  CLINICAL IMPRESSION: Shawanda continues to report increased L lateral neck  and upper shoulder pain but denies pain at shoulder joint. PCP prescribed muscle relaxants today to try at night. Johnetta showed tolerance to the previously introduced exercises and is motivated to continue increasing her ROM. Rose-Marie had some trouble performing the standing isometrics at the wall so she will continue with the seated isometric exercises to minimize the pain she feels. Beaulah was given two new exercises for her HEP hoping to continue increasing her strength and hopefully bring her overall tenderness and pain down.   OBJECTIVE IMPAIRMENTS: decreased activity tolerance, decreased mobility, decreased ROM, decreased strength, increased fascial restrictions, impaired perceived  functional ability, increased muscle spasms, impaired flexibility, impaired UE functional use, improper body mechanics, postural dysfunction, and pain.   ACTIVITY LIMITATIONS: carrying, lifting, bed mobility, bathing, dressing, reach over head, and hygiene/grooming  PARTICIPATION LIMITATIONS: meal prep, cleaning, laundry, driving, shopping, community activity, and church  PERSONAL FACTORS: Age, Fitness, Past/current experiences, Time since onset of injury/illness/exacerbation, and 3+ comorbidities: R TKA 07/2019, B THR 2018 & 2017, posterior C1-2 cervical fusion 2018, rupture of flexor tendon of L hand with failed surgical repair 2020, Parkinson's, bladder cancer, HTN  are also affecting patient's functional outcome.   REHAB POTENTIAL: Good  CLINICAL DECISION MAKING: Evolving/moderate complexity  EVALUATION COMPLEXITY: Low   GOALS: Goals reviewed with patient? Yes  SHORT TERM GOALS: Target date: 12/20/2022  Patient will be independent with initial HEP to improve outcomes and carryover.  Baseline:  Goal status: IN PROGRESS  2.  Patient will improve L shoulder P/AAROM to >/= 115 flexion, 90 abduction and 45 ER w/o increased pain to promote more functional L UE use.  Baseline:  Goal status: IN PROGRESS  LONG TERM GOALS: Target date: 01/17/2023  Patient will be independent with ongoing/advanced HEP for self-management at home.  Baseline:  Goal status: IN PROGRESS  2.  Patient will report 50-75% improvement in L shoulder pain to improve QOL.  Baseline:  Goal status: IN PROGRESS  3.  Patient to demonstrate improved upright posture with posterior shoulder girdle engaged to promote improved glenohumeral joint mobility. Baseline:  Goal status: IN PROGRESS  4.  Patient to improve L shoulder AROM to [facility] without pain provocation to allow for increased ease of ADLs.  Baseline:  Goal status: IN PROGRESS  5.  Patient will demonstrate improved L shoulder strength to >/= 4/5 for functional  UE use. Baseline:  Goal status: IN PROGRESS  6  Patient will report </= 55% on QuickDASH to demonstrate improved functional ability.  Baseline: 65.9 / 100 = 65.9 % Goal status: IN PROGRESS   PLAN:  PT FREQUENCY: 2x/week  PT DURATION: 8 weeks  PLANNED INTERVENTIONS: Therapeutic exercises, Therapeutic activity, Neuromuscular re-education, Balance training, Gait training, Patient/Family education, Self Care, Joint mobilization, Dry Needling, Electrical stimulation, Cryotherapy, Moist heat, scar mobilization, Taping, Vasopneumatic device, Ionotophoresis '4mg'$ /ml Dexamethasone, Manual therapy, and Re-evaluation  PLAN FOR NEXT SESSION: L shoulder P/AAROM per protocol; L shoulder ABD & ER isometrics; supine AROM "upper cut" & "hitchhiker"; MT and modalities as needed/tolerated to address pain and abnormal muscle tension   Percival Spanish, PT 11/29/2022, 3:00 PM

## 2022-11-29 NOTE — Progress Notes (Signed)
Subjective:   By signing my name below, I, Stephanie [name], attest that this documentation has been prepared under the direction and in the presence of Mosie Lukes MD 11/29/2022   Patient ID: Stephanie [name], female    DOB: [birth year], 89 y.o.   MRN: [id number]  Chief Complaint  Patient presents with   Follow-up    Follow up   HPI Patient is in today for a follow up  She presents with neck and shoulder pain. She reports that she recently had her shoulder replaced and it has been a slow recovery. She is currently doing physical therapy, which she is content with. She has tried dry needling and notes she does not appreciate it. She checked in with her surgeon and reports that her left shoulder has been healing well.  She reports an episode of severe nausea some time before Christmas. She was encouraged to go to the ED for fluids. She went and was prescribed medication that has helped.   She has been having trouble sleeping due to her shoulder pain. She has a Bed Buddy that she has been using to relieve the pain.   She monitors her pulse at home. Her readings overall have been normal.   She is up to date on her RSV vaccination. She last received the vaccination on 09/23/2022 at the [facility] in [place].   She denies having any fever, new moles, congestion, sinus pain, sore throat, chest pain, palpitations, cough, SOB, wheezing, n/v/d, constipation, blood in stool, dysuria, frequency, hematuria, or headaches at this time.  Past Medical History:  Diagnosis Date   Allergy    Anemia    past hx    Arthropathy of cervical spine 11/27/2012   Right  [facility] at [facility]  On 04/08/2016  AP, lateral, and lateral flexion and extension views of the cervical spine are submitted for evaluation.   No acute fracture identified in the cervical spine.   There is redemonstration of approximately 2 mm of C3 on C4 anterolisthesis in neutral which slightly increases in flexion relative to neutral and extension. Slight  C5 on C6 retrolisthesis does not change i   Benign hypertension without congestive heart failure    Benign mole    right hcets mole, bleeds when dried with towel   Bilateral dry eyes    Bladder cancer ([place]) October 21, 2016   Cervical spondylosis    Cervicalgia    2 screws in place    Depression with anxiety 10-21-16   prior to husbands death    Dyspnea    Gross hematuria    developed after first hip replacment, led to indicental finding of bladder tumor, bleeding now resolved    H/O total shoulder replacement, left    History of kidney stones    1970's   Left hand weakness    due to reinjury of flxor tendon s/p tendon surgery march 12-2018   Lumbar stenosis    Malignant tumor of urinary bladder ([place]) 07/2016   Pre-stage I   OA (osteoarthritis)    Parkinson disease dx 2012   neurologist-  dr siddiqui at [place] [facility])     Dr Trudie Reed , [facility]    Rupture of flexor tendon of hand 2020   right    Past Surgical History:  Procedure Laterality Date   BIOPSY MASS LEFT FIRST METACARPAL   06/23/2006   benign   CATARACT EXTRACTION W/ INTRAOCULAR LENS  IMPLANT, BILATERAL  1999 and 2003  CERVICAL FUSION  02/2017   c1-c2 ; reports she has 2 screws 2in long in place done at [facility] ; patient exhibits VERY LIMITED NECK ROM   COLONOSCOPY     CYSTOSCOPY W/ RETROGRADES Bilateral 09/22/2016   Procedure: CYSTOSCOPY WITH RETROGRADE PYELOGRAM;  Surgeon: Alexis Frock, MD;  Location: [facility];  Service: Urology;  Laterality: Bilateral;   D & C HYSTEROSCOPY W/ RESECTION POLYP  07/11/2000   DILATION AND CURETTAGE OF UTERUS  1960's   EXCISION CYST AND DEBRIDEMENT RIGHT WIRST AND REMOVAL FORGEIGN BODY  01/09/2009   FLEXOR TENDON REPAIR Left 12/12/2018   Procedure: REPAIR/TRANSFER FLEXOR DIGITORUM PROFUNDUS OF LEFT SMALL FINGER;  Surgeon: Daryll Brod, MD;  Location: [facility];  Service: Orthopedics;  Laterality: Left;   LAPAROSCOPY   yrs ago   infertility    METACARPOPHALANGEAL JOINT ARTHRODESIS Right 05/25/1996   REVERSE SHOULDER ARTHROPLASTY Left 10/14/2022   Procedure: REVERSE SHOULDER ARTHROPLASTY;  Surgeon: Justice Britain, MD;  Location: [facility];  Service: Orthopedics;  Laterality: Left;  187mn   TENDON REPAIR Left 01/15/2019   Hand   TONSILLECTOMY AND ADENOIDECTOMY  child   TOTAL HIP ARTHROPLASTY Left 07/07/2016   Procedure: LEFT TOTAL HIP ARTHROPLASTY ANTERIOR APPROACH;  Surgeon: FGaynelle Arabian MD;  Location: [facility];  Service: Orthopedics;  Laterality: Left;   TOTAL HIP ARTHROPLASTY Right 09/28/2017   Procedure: RIGHT TOTAL HIP ARTHROPLASTY ANTERIOR APPROACH;  Surgeon: AGaynelle Arabian MD;  Location: [facility];  Service: Orthopedics;  Laterality: Right;   TOTAL KNEE ARTHROPLASTY Right 07/30/2019   Procedure: TOTAL KNEE ARTHROPLASTY;  Surgeon: AGaynelle Arabian MD;  Location: [facility];  Service: Orthopedics;  Laterality: Right;  542m   TRANSURETHRAL RESECTION OF BLADDER TUMOR N/A 09/22/2016   Procedure: TRANSURETHRAL RESECTION OF BLADDER TUMOR (TURBT);  Surgeon: ThAlexis FrockMD;  Location: [facility] Service: Urology;  Laterality: N/A;    Family History  Problem Relation Age of Onset   Arthritis Mother    Hypertension Mother    Deep vein thrombosis Mother        recurrent, secondary to Bleeding disorder   Arthritis Father    Stroke Father    Bladder Cancer Father    Diabetes Sister    Heart disease Sister    Obesity Sister    Arthritis Sister    Hypertension Sister    Heart disease Maternal Grandmother    Heart disease Maternal Grandfather    Peripheral vascular disease Paternal Grandmother        s/p leg amputation   Stroke Paternal Grandfather    Esophageal cancer Paternal Aunt    Colon cancer Neg Hx    Colon polyps Neg Hx    Rectal cancer Neg Hx    Stomach cancer Neg Hx     Social History   Socioeconomic History   Marital status: Widowed    Spouse name: Not on file   Number of  children: Not on file   Years of education: Not on file   Highest education level: Not on file  Occupational History   Not on file  Tobacco Use   Smoking status: Former    Years: 10.00    Types: Cigarettes    Quit date: 05/04/1964    Years since quitting: 58.6   Smokeless tobacco: Never   Tobacco comments:    Pt states that when she was smoking 1 pack of cigs would last her 3 days. 10/13/22 ALS   Vaping Use   Vaping  Use: Never used  Substance and Sexual Activity   Alcohol use: Yes    Alcohol/week: 7.0 standard drinks of alcohol    Types: 7 Glasses of wine per week    Comment: ocassionally- social    Drug use: No   Sexual activity: Not Currently    Partners: Male  Other Topics Concern   Not on file  Social History Narrative   Not on file   Social Determinants of Health   Financial Resource Strain: Low Risk  (04/27/2021)   Overall Financial Resource Strain (CARDIA)    Difficulty of Paying Living Expenses: Not hard at all  Food Insecurity: Unknown (10/14/2022)   Hunger Vital Sign    Worried About Running Out of Food in the Last Year: Patient refused    Rachel in the Last Year: Patient refused  Transportation Needs: Unknown (10/14/2022)   [place] - Transportation    Lack of Transportation (Medical): Patient refused    Lack of Transportation (Non-Medical): Patient refused  Physical Activity: Insufficiently Active (09/27/2022)   Exercise Vital Sign    Days of Exercise per Week: 2 days    Minutes of Exercise per Session: 10 min  Stress: Stress Concern Present (11/01/2022)   [place]    Feeling of Stress : To some extent  Social Connections: Moderately Integrated (04/27/2021)   Social Connection and Isolation Panel [NHANES]    Frequency of Communication with Friends and Family: More than three times a week    Frequency of Social Gatherings with Friends and Family: More than three times a week     Attends Religious Services: More than 4 times per year    Active Member of [employer] or Organizations: Yes    Attends Archivist Meetings: More than 4 times per year    Marital Status: Widowed  Intimate Partner Violence: Unknown (10/14/2022)   Humiliation, Afraid, Rape, and Kick questionnaire    Fear of Current or Ex-Partner: Patient refused    Emotionally Abused: Patient refused    Physically Abused: Patient refused    Sexually Abused: Patient refused    Outpatient Medications Prior to Visit  Medication Sig Dispense Refill   amLODipine (NORVASC) 5 MG tablet TAKE 1 TABLET (5 MG TOTAL) BY MOUTH DAILY. 90 tablet 3   Apoaequorin (PREVAGEN PO) Take 1 tablet by mouth in the morning.     carbidopa-levodopa (SINEMET IR) 25-100 MG per tablet Take 0.5-1 tablets by mouth See admin instructions. Take 1 tablet by mouth in the morning upon waking & take 0.5 tablet by mouth every 2 hours thereafter for a total of 6 tablets daily.     Carbidopa-Levodopa ER (SINEMET CR) 25-100 MG tablet controlled release Take 1 tablet by mouth at bedtime.     cholecalciferol (VITAMIN D3) 25 MCG (1000 UNIT) tablet Take 1,000 Units by mouth in the morning.     Coenzyme Q10 (COQ10 PO) Take 1 tablet by mouth in the morning.     diazepam (VALIUM) 5 MG tablet TAKE 1 TABLET (5 MG TOTAL) BY MOUTH EVERY 12 (TWELVE) HOURS AS NEEDED. FOR ANXIETY 60 tablet 1   DIGESTIVE ENZYMES PO Take 1 capsule by mouth 3 (three) times daily before meals. Active Enzymes by Delynn Flavin     EPINEPHrine 0.3 mg/0.3 mL IJ SOAJ injection Inject 0.3 mg into the muscle as needed for anaphylaxis.     estradiol (ESTRACE) 0.5 MG tablet TAKE 1/2 OF A TABLET (0.25 MG TOTAL)  BY MOUTH EVERY DAY 45 tablet 1   fluticasone (CUTIVATE) 0.05 % cream Apply 1 application topically daily as needed (scalp psoriasis).  1   golimumab (SIMPONI ARIA) 50 MG/4ML SOLN injection Inject 50 mg into the vein See admin instructions. Given every 2 months - Last given 06/28/2019      hydrochlorothiazide (HYDRODIURIL) 25 MG tablet Take 1 tablet (25 mg total) by mouth daily. 90 tablet 1   medroxyPROGESTERone (PROVERA) 2.5 MG tablet TAKE 0.5 TABLETS BY MOUTH DAILY. 45 tablet 3   melatonin 5 MG TABS Take 5 mg by mouth at bedtime.     meloxicam (MOBIC) 15 MG tablet Take 15 mg by mouth daily as needed (joint pain (max 3-4 times/weekly)).     Multiple Vitamin (MULTIVITAMIN WITH MINERALS) TABS tablet Take 1 tablet by mouth in the morning. Centrum Silver     Polyethyl Glycol-Propyl Glycol (SYSTANE) 0.4-0.3 % SOLN Place 1-2 drops into both eyes 3 (three) times daily as needed (dry/irritated eyes.).     Probiotic Product (PROBIOTIC PO) Take 1 capsule by mouth in the morning and at bedtime. Restore Ultimate Probiotic     umeclidinium-vilanterol (ANORO ELLIPTA) 62.5-25 MCG/ACT AEPB Inhale 1 puff into the lungs daily. 1 each 3   potassium chloride 20 MEQ TBCR Take 40 mEq by mouth 2 (two) times daily for 4 days. 16 tablet 0   No facility-administered medications prior to visit.    Allergies  Allergen Reactions   Cleocin [Clindamycin] Diarrhea   Codeine Nausea And Vomiting   Hornet Venom    Penicillins Itching and Swelling    Has patient had a PCN reaction causing immediate rash, facial/tongue/throat swelling, SOB or lightheadedness with hypotension: Yes Has patient had a PCN reaction causing severe rash involving mucus membranes or skin necrosis: No Has patient had a PCN reaction that required hospitalization: No Has patient had a PCN reaction occurring within the last 10 years: No If all of the above answers are "NO", then may proceed with Cephalosporin use.    Vibramycin [Doxycycline] Diarrhea   Yellow Jacket Venom Swelling   Lodine [Etodolac] Rash   Tramadol Itching and Rash    Review of Systems  Gastrointestinal:  Positive for nausea (was hospitalized).  Musculoskeletal:  Positive for joint pain (shoulder pain attributed to shoulder repair surgery).   Psychiatric/Behavioral:  The patient has insomnia (unable to sleep well due to shoulder pain).        Objective:    Physical Exam Constitutional:      General: She is not in acute distress.    Appearance: Normal appearance.  HENT:     Head: Normocephalic and atraumatic.     Right Ear: Tympanic membrane, ear canal and external ear normal.     Left Ear: Tympanic membrane, ear canal and external ear normal.  Eyes:     Extraocular Movements: Extraocular movements intact.     Pupils: Pupils are equal, round, and reactive to light.  Cardiovascular:     Rate and Rhythm: Normal rate and regular rhythm.     Pulses: Normal pulses.     Heart sounds: Normal heart sounds. No murmur heard.    No gallop.  Pulmonary:     Effort: Pulmonary effort is normal. No respiratory distress.     Breath sounds: Normal breath sounds. No wheezing.  Abdominal:     General: Abdomen is flat.     Palpations: Abdomen is soft. There is no mass.     Tenderness: There is no guarding.  Musculoskeletal:     Right lower leg: No edema.     Left lower leg: No edema.  Skin:    General: Skin is warm and dry.  Neurological:     General: No focal deficit present.     Mental Status: She is alert and oriented to person, place, and time.     Cranial Nerves: No cranial nerve deficit.     Coordination: Coordination normal.  Psychiatric:        Judgment: Judgment normal.     BP 110/62 (BP Location: Right Arm, Patient Position: Sitting, Cuff Size: Normal)   Pulse 98   Temp 98 F (36.7 C) (Oral)   Resp 16   Ht '5\' 3"'$  (1.6 m)   Wt 119 lb (54 kg)   LMP 01/13/2013 Comment: spotting-had benign endometrial biopsy   SpO2 98%   BMI 21.08 kg/m  Wt Readings from Last 3 Encounters:  11/29/22 119 lb (54 kg)  11/25/22 111 lb (50.3 kg)  11/12/22 111 lb (50.3 kg)    Diabetic Foot Exam - Simple   No data filed    Lab Results  Component Value Date   WBC 7.4 11/12/2022   HGB 13.5 11/12/2022   HCT 39.2 11/12/2022    PLT 234 11/12/2022   GLUCOSE 121 (H) 11/19/2022   CHOL 163 08/02/2022   TRIG 89.0 08/02/2022   HDL 60.20 08/02/2022   LDLDIRECT 120.0 12/11/2014   LDLCALC 85 08/02/2022   ALT 5 11/11/2022   AST 16 11/11/2022   NA 133 (L) 11/19/2022   K 3.7 11/19/2022   CL 94 (L) 11/19/2022   CREATININE 0.79 11/19/2022   BUN 17 11/19/2022   CO2 31 11/19/2022   TSH 1.32 08/02/2022   INR 1.0 07/25/2019   HGBA1C 5.1 02/14/2020    Lab Results  Component Value Date   TSH 1.32 08/02/2022   Lab Results  Component Value Date   WBC 7.4 11/12/2022   HGB 13.5 11/12/2022   HCT 39.2 11/12/2022   MCV 86.9 11/12/2022   PLT 234 11/12/2022   Lab Results  Component Value Date   NA 133 (L) 11/19/2022   K 3.7 11/19/2022   CO2 31 11/19/2022   GLUCOSE 121 (H) 11/19/2022   BUN 17 11/19/2022   CREATININE 0.79 11/19/2022   BILITOT 0.8 11/11/2022   ALKPHOS 51 11/11/2022   AST 16 11/11/2022   ALT 5 11/11/2022   PROT 6.5 11/11/2022   ALBUMIN 4.6 11/11/2022   CALCIUM 9.6 11/19/2022   ANIONGAP 11 11/12/2022   GFR 66.58 11/19/2022   Lab Results  Component Value Date   CHOL 163 08/02/2022   Lab Results  Component Value Date   HDL 60.20 08/02/2022   Lab Results  Component Value Date   LDLCALC 85 08/02/2022   Lab Results  Component Value Date   TRIG 89.0 08/02/2022   Lab Results  Component Value Date   CHOLHDL 3 08/02/2022   Lab Results  Component Value Date   HGBA1C 5.1 02/14/2020      Assessment & Plan:   Problem List Items Addressed This Visit     HTN (hypertension) - Primary    Well controlled, no changes to meds. Encouraged heart healthy diet such as the DASH diet and exercise as tolerated.       Parkinson's disease    Continues to follow with neurology and progress slowly      Hyperlipidemia    Tolerating statin, encouraged heart healthy diet, avoid trans fats, minimize  simple carbs and saturated fats. Increase exercise as tolerated      Vitamin D deficiency     Supplement and monitor      Shoulder pain, left    She is recovering from surgery and pain is slowly improving but the pain is affecting her sleep. Will allow low dose Tizanidine to use qhs and see if that helps her pain and sleep      Meds ordered this encounter  Medications   tiZANidine (ZANAFLEX) 2 MG tablet    Sig: Take 0.5-2 tablets (1-4 mg total) by mouth 2 (two) times daily as needed for muscle spasms.    Dispense:  30 tablet    Refill:  2   I, Penni Homans, MD, personally preformed the services described in this documentation.  All medical record entries made by the scribe were at my direction and in my presence.  I have reviewed the chart and discharge instructions (if applicable) and agree that the record reflects my personal performance and is accurate and complete. 12/01/22   I,Rachel Rivera,acting as a scribe for Penni Homans, MD.,have documented all relevant documentation on the behalf of Penni Homans, MD,as directed by  Penni Homans, MD while in the presence of Penni Homans, MD.   Penni Homans, MD

## 2022-11-30 ENCOUNTER — Encounter: Payer: Self-pay | Admitting: Family Medicine

## 2022-11-30 DIAGNOSIS — L4050 Arthropathic psoriasis, unspecified: Secondary | ICD-10-CM | POA: Diagnosis not present

## 2022-12-01 NOTE — Assessment & Plan Note (Signed)
Tolerating statin, encouraged heart healthy diet, avoid trans fats, minimize simple carbs and saturated fats. Increase exercise as tolerated 

## 2022-12-01 NOTE — Assessment & Plan Note (Signed)
She is recovering from surgery and pain is slowly improving but the pain is affecting her sleep. Will allow low dose Tizanidine to use qhs and see if that helps her pain and sleep

## 2022-12-01 NOTE — Assessment & Plan Note (Signed)
Supplement and monitor 

## 2022-12-01 NOTE — Assessment & Plan Note (Signed)
Continues to follow with neurology and progress slowly

## 2022-12-01 NOTE — Assessment & Plan Note (Signed)
Well controlled, no changes to meds. Encouraged heart healthy diet such as the DASH diet and exercise as tolerated.

## 2022-12-02 ENCOUNTER — Ambulatory Visit: Payer: Medicare Other

## 2022-12-02 DIAGNOSIS — M25612 Stiffness of left shoulder, not elsewhere classified: Secondary | ICD-10-CM | POA: Diagnosis not present

## 2022-12-02 DIAGNOSIS — G8929 Other chronic pain: Secondary | ICD-10-CM

## 2022-12-02 DIAGNOSIS — R293 Abnormal posture: Secondary | ICD-10-CM

## 2022-12-02 DIAGNOSIS — M6281 Muscle weakness (generalized): Secondary | ICD-10-CM | POA: Diagnosis not present

## 2022-12-02 DIAGNOSIS — M25512 Pain in left shoulder: Secondary | ICD-10-CM | POA: Diagnosis not present

## 2022-12-02 NOTE — Therapy (Signed)
OUTPATIENT PHYSICAL THERAPY TREATMENT   Patient Name: Stephanie [name] MRN: [id number] DOB:1932-02-10, 89 y.o., female Today's Date: 12/02/2022   END OF SESSION:  PT End of Session - 12/02/22 1451     Visit Number 4    Date for PT Re-Evaluation 01/17/23    Authorization Type Medicare & BCBS    PT Start Time 1401    PT Stop Time 1455    PT Time Calculation (min) 54 min    Activity Tolerance Patient tolerated treatment well    Behavior During Therapy WFL for tasks assessed/performed              Past Medical History:  Diagnosis Date   Allergy    Anemia    past hx    Arthropathy of cervical spine 11/27/2012   Right  Xray at [facility]  On 04/08/2016  AP, lateral, and lateral flexion and extension views of the cervical spine are submitted for evaluation.   No acute fracture identified in the cervical spine.   There is redemonstration of approximately 2 mm of C3 on C4 anterolisthesis in neutral which slightly increases in flexion relative to neutral and extension. Slight C5 on C6 retrolisthesis does not change i   Benign hypertension without congestive heart failure    Benign mole    right hcets mole, bleeds when dried with towel   Bilateral dry eyes    Bladder cancer ([place]) 11-05-2016   Cervical spondylosis    Cervicalgia    2 screws in place    Depression with anxiety 05-Nov-2016   prior to husbands death    Dyspnea    Gross hematuria    developed after first hip replacment, led to indicental finding of bladder tumor, bleeding now resolved    H/O total shoulder replacement, left    History of kidney stones    1970's   Left hand weakness    due to reinjury of flxor tendon s/p tendon surgery march 12-2018   Lumbar stenosis    Malignant tumor of urinary bladder ([place]) 07/2016   Pre-stage I   OA (osteoarthritis)    Parkinson disease dx 2012   neurologist-  dr siddiqui at [place] [facility])     Dr Trudie Reed , [place] [facility]    Rupture of flexor tendon of hand 2020    right   Past Surgical History:  Procedure Laterality Date   BIOPSY MASS LEFT FIRST METACARPAL   06/23/2006   benign   CATARACT EXTRACTION W/ INTRAOCULAR LENS  IMPLANT, BILATERAL  1999 and 2003   CERVICAL FUSION  02/2017   c1-c2 ; reports she has 2 screws 2in long in place done at [facility] ; patient exhibits VERY LIMITED NECK ROM   COLONOSCOPY     CYSTOSCOPY W/ RETROGRADES Bilateral 09/22/2016   Procedure: CYSTOSCOPY WITH RETROGRADE PYELOGRAM;  Surgeon: Alexis Frock, MD;  Location: [facility];  Service: Urology;  Laterality: Bilateral;   D & C HYSTEROSCOPY W/ RESECTION POLYP  07/11/2000   DILATION AND CURETTAGE OF UTERUS  1960's   EXCISION CYST AND DEBRIDEMENT RIGHT WIRST AND REMOVAL FORGEIGN BODY  01/09/2009   FLEXOR TENDON REPAIR Left 12/12/2018   Procedure: REPAIR/TRANSFER FLEXOR DIGITORUM PROFUNDUS OF LEFT SMALL FINGER;  Surgeon: Daryll Brod, MD;  Location: [facility] [facility];  Service: Orthopedics;  Laterality: Left;   LAPAROSCOPY  yrs ago   infertility    METACARPOPHALANGEAL JOINT ARTHRODESIS Right 05/25/1996   REVERSE SHOULDER ARTHROPLASTY Left 10/14/2022  Procedure: REVERSE SHOULDER ARTHROPLASTY;  Surgeon: Justice Britain, MD;  Location: [facility];  Service: Orthopedics;  Laterality: Left;  144mn   TENDON REPAIR Left 01/15/2019   Hand   TONSILLECTOMY AND ADENOIDECTOMY  child   TOTAL HIP ARTHROPLASTY Left 07/07/2016   Procedure: LEFT TOTAL HIP ARTHROPLASTY ANTERIOR APPROACH;  Surgeon: FGaynelle Arabian MD;  Location: [facility];  Service: Orthopedics;  Laterality: Left;   TOTAL HIP ARTHROPLASTY Right 09/28/2017   Procedure: RIGHT TOTAL HIP ARTHROPLASTY ANTERIOR APPROACH;  Surgeon: AGaynelle Arabian MD;  Location: [facility];  Service: Orthopedics;  Laterality: Right;   TOTAL KNEE ARTHROPLASTY Right 07/30/2019   Procedure: TOTAL KNEE ARTHROPLASTY;  Surgeon: AGaynelle Arabian MD;  Location: [facility];  Service: Orthopedics;  Laterality: Right;  566m   TRANSURETHRAL  RESECTION OF BLADDER TUMOR N/A 09/22/2016   Procedure: TRANSURETHRAL RESECTION OF BLADDER TUMOR (TURBT);  Surgeon: ThAlexis FrockMD;  [facility] Service: [facility];  Laterality: N/A;   Patient Active Problem List   Diagnosis Date Noted   S/P reverse total shoulder arthroplasty, left 10/14/2022   Preoperative cardiovascular examination 10/04/2022   SOB (shortness of breath) 08/02/2022   Insomnia 08/02/2022   Hyponatremia 08/01/2022   Shoulder pain, left 09/14/2021   History of COVID-19 01/06/2021   Diarrhea 09/03/2020   Vitamin D deficiency 09/03/2020   OA (osteoarthritis) of knee 07/30/2019   Tinea corporis 06/08/2019   [place]oriatic arthritis ([place]/18/2020   Anemia 03/13/2018   Hot flashes 03/13/2018   S/P cervical spinal fusion 03/31/2017   Neck pain 12/12/2016   Depression with anxiety 10/17/2016   Bladder cancer ([place]/01/2016   OA (osteoarthritis) of hip 07/07/2016   Spinal stenosis of lumbar region with radiculopathy 04/08/2016   Spondylolisthesis of lumbar region 04/08/2016   Spondylosis of cervical region without myelopathy or radiculopathy 04/08/2016   Preventative health care 01/18/2016   Low back pain 01/18/2016   History of chicken pox    Hyperlipidemia 12/15/2014   Allergic rhinitis 06/25/2014   Allergy to bee sting 06/10/2014   TMJ tenderness 11/16/2013   Other malaise and fatigue 05/07/2013   Arthropathy of cervical spine ([place]/13/2014   Muscle spasms of neck 11/27/2012   Parkinson's disease 05/24/2012   Conjunctivochalasis 03/09/2012   Epiretinal membrane 03/09/2012   Hyperopia with astigmatism and presbyopia 03/09/2012   Pseudophakia 03/09/2012   HTN (hypertension) 08/31/2011   Right knee pain 08/31/2011   Benign hypertensive heart disease without heart failure 05/12/2011    PCP: BlMosie LukesMD   REFERRING PROVIDER: SuJustice BritainMD   REFERRING DIAG: [id number]-10-CM) - Presence of left artificial shoulder  joint   THERAPY DIAG:  Stiffness of left shoulder, not elsewhere classified  Chronic left shoulder pain  Abnormal posture  Muscle weakness (generalized)  RATIONALE FOR EVALUATION AND TREATMENT: Rehabilitation  ONSET DATE: 10/14/2022 - L reverse TSA  NEXT MD VISIT: ~01/04/23   SUBJECTIVE:  SUBJECTIVE STATEMENT: Pt reports her neck has been hurting more than her L shoulder.  PAIN: Are you having pain? Yes: NPRS scale: 7/10 Pain location: L neck & upper shoulder Pain description: sharp and dull, constant, clicking Aggravating factors: constant Relieving factors: ice, heat, rest  PERTINENT HISTORY:  L shoulder advanced OA - reverse TSA on 10/14/22; R TKA 07/2019, B THR 2018 & 2017, posterior C1-2 cervical fusion 2018, rupture of flexor tendon of L hand with failed surgical repair 2020, Parkinson's, bladder cancer, HTN    PRECAUTIONS: Shoulder  WEIGHT BEARING RESTRICTIONS: No  FALLS:  Has patient fallen in last 6 months? No  LIVING ENVIRONMENT: Lives with: lives alone Lives in: House/apartment Stairs: Yes: External: 4 steps; on right going up, on left going up, and can reach both Has following equipment at home: Single point cane, Walker - 2 wheeled, shower chair, bed side commode, and Grab bars  OCCUPATION: Retired  PLOF: Independent and Leisure: volunteering at [employer], bible study, walking 1/3 mile daily  PATIENT GOALS: "To get rid of my pain and be able to do things with both hands."   OBJECTIVE:   DIAGNOSTIC FINDINGS:  09/14/21 - L shoulder x-ray: IMPRESSION: 1. No acute bony abnormality. 2. Mild/moderate degenerative changes.  PATIENT SURVEYS:  Quick Dash 65.9 / 100 = 65.9 %  COGNITION: Overall cognitive status: Within functional limits for tasks  assessed     SENSATION: WFL  POSTURE: rounded shoulders, forward head, weight shift right, and scoliosis  UPPER EXTREMITY ROM:   Active ROM Right eval Left eval  Shoulder flexion 152 89 *  Shoulder extension    Shoulder abduction 144 55 *  Shoulder adduction    Shoulder internal rotation    Shoulder external rotation 71 45 *  Elbow flexion    Elbow extension    Wrist flexion    Wrist extension    Wrist ulnar deviation    Wrist radial deviation    Wrist pronation    Wrist supination     Passive ROM Left eval  Shoulder flexion 91  Shoulder extension   Shoulder abduction 64  Shoulder adduction   Shoulder internal rotation 31 at 30 ABD  Shoulder external rotation   Elbow flexion   Elbow extension   Wrist flexion   Wrist extension   Wrist ulnar deviation   Wrist radial deviation   Wrist pronation   Wrist supination   (Blank rows = not tested) * = pain  UPPER EXTREMITY MMT:  (deferred on eval)  MMT Right eval Left eval  Shoulder flexion    Shoulder extension    Shoulder abduction    Shoulder adduction    Shoulder internal rotation    Shoulder external rotation    Middle trapezius    Lower trapezius    Elbow flexion    Elbow extension    Wrist flexion    Wrist extension    Wrist ulnar deviation    Wrist radial deviation    Wrist pronation    Wrist supination    Grip strength (lbs)    (Blank rows = not tested)  PALPATION:  TTP with taut bands and TPs in L posterolateral shoulder and pecs    TODAY'S TREATMENT:   12/02/22 THERAPEUTIC EXERCISE: to improve flexibility, strength and mobility.  Verbal and tactile cues throughout for technique.  Pulleys: L shoulder flexion & scaption x 3 min each Seated scap retraction 2x10 Supine L uppercut 2x10  Supine L hitchhiker 2x10  Manual Therapy: STM and  TPR to L UT and LS  MODALITIES: IFC to L UT , 80-150 Hz, intensity to patient tolerance x 10 min  11/29/22 THERAPEUTIC EXERCISE: to improve  flexibility, strength and mobility.  Verbal and tactile cues throughout for technique.  Pulleys: L shoulder flexion & scaption x 3 min each Seated L shoulder ER isometrics 10 x 5" - submax avoiding increased pain at scapula/neck Seated L shoulder ABD isometrics 10 x 5"  Seated B scapular retraction 10 x 3", 2 sets Seated L shoulder ABD rollout with blue/green on mat table 10 x 3" Hooklying L shoulder flexion "upper cut" AROM 2 x 10 Hooklying L shoulder abduction/scaption "hitch hiker" AROM 2 x 10 Standing pendulums: Flexion/extension x 30" Horizontal ABD/ADD x 30" CW/CCW x 30" each Standing L shoulder ER isometric - attempted at doorframe but deferred 2 increased pain   MANUAL THERAPY: To promote normalized muscle tension and reduced pain. Palpation identifying increased TTP over L infraspinatus, biceps and UT/LS  MODALITIES: IFC to L shoulder complex, 80-150 Hz, intensity to patient tolerance x 15 min  Moist heat to L upper shoulder/L lateral neck x 15 min   11/26/22 THERAPEUTIC EXERCISE: to improve flexibility, strength and mobility.  Verbal and tactile cues throughout for technique.  Pulleys: L shoulder flexion & scaption x 3 min each Seated wand L shoulder ER AAROM x 10  Seated wand L shoulder ABD AAROM x 10  Seated L shoulder ER isometrics 10 x 5" - submax avoiding increased pain at scapula Seated L shoulder ABD isometrics 10 x 5"  Hooklying wand L shoulder flexion AAROM x 10 Hooklying L shoulder flexion "upper cut" AROM x 10 Hooklying L shoulder abduction/scaption "hitch hiker" AROM x 10  MANUAL THERAPY: To promote normalized muscle tension, improved flexibility, and reduced pain. STM with attempted TPR to L UT - very limited tolerance  MODALITIES: IFC to L UT, 80-150 Hz, intensity to patient tolerance x 15 min  Moist heat to L upper shoulder/L lateral neck x 15 min   11/22/22 THERAPEUTIC EXERCISE: to improve flexibility, strength and mobility.  Verbal and tactile cues  throughout for technique.  Hooklying wand L shoulder flexion AAROM x 10 Hooklying wand L shoulder ER AAROM with towel roll under upper arm x 10 Hooklying wand L shoulder ABD AAROM x 10 Seated wand L shoulder ER AAROM x 10 - better tolerated than supine Seated wand L shoulder ABD AAROM x 10 - better tolerated than supine   PATIENT EDUCATION:  Education details: HEP review - review of cervical retraction and UT stretch from prior PT episode Person educated: Patient Education method: Customer service manager Education comprehension: verbalized understanding and returned demonstration   HOME EXERCISE PROGRAM: Access Code: [email or web address] URL: [email or web address] Date: 11/29/2022 Prepared by: Annie Paras  Exercises - Supine Shoulder Flexion AAROM with Dowel  - 2 x daily - 7 x weekly - 2 sets - 10 reps - 3 sec hold - Seated Shoulder Abduction AAROM with Dowel  - 2 x daily - 7 x weekly - 2 sets - 10 reps - 3 sec hold - Seated Shoulder External Rotation AAROM with Cane and Hand in Neutral (Mirrored)  - 2 x daily - 7 x weekly - 2 sets - 10 reps - 3 sec hold - Isometric Shoulder External Rotation  - 1 x daily - 7 x weekly - 2 sets - 10 reps - 5 sec hold - Isometric Shoulder Abduction  - 1 x daily - 7 x weekly - 2 sets -  10 reps - 3 sec hold   ASSESSMENT:  CLINICAL IMPRESSION: Alvetta demonstrated a good respnse to treatment. She c/o pain along the L side of her neck, so worked on UT and LS with MT. She did have improvement in muscle tension and pain after the manual. Continued with AROM exercises to tolerance. We had to adjust her ROM with uppercuts to avoid neck pain. Concluded with estim to L UT area to reduce pain and muscle tension.  OBJECTIVE IMPAIRMENTS: decreased activity tolerance, decreased mobility, decreased ROM, decreased strength, increased fascial restrictions, impaired perceived functional ability, increased muscle spasms, impaired flexibility, impaired UE  functional use, improper body mechanics, postural dysfunction, and pain.   ACTIVITY LIMITATIONS: carrying, lifting, bed mobility, bathing, dressing, reach over head, and hygiene/grooming  PARTICIPATION LIMITATIONS: meal prep, cleaning, laundry, driving, shopping, community activity, and church  PERSONAL FACTORS: Age, Fitness, Past/current experiences, Time since onset of injury/illness/exacerbation, and 3+ comorbidities: R TKA 07/2019, B THR 2018 & 2017, posterior C1-2 cervical fusion 2018, rupture of flexor tendon of L hand with failed surgical repair 2020, Parkinson's, bladder cancer, HTN  are also affecting patient's functional outcome.   REHAB POTENTIAL: Good  CLINICAL DECISION MAKING: Evolving/moderate complexity  EVALUATION COMPLEXITY: Low   GOALS: Goals reviewed with patient? Yes  SHORT TERM GOALS: Target date: 12/20/2022  Patient will be independent with initial HEP to improve outcomes and carryover.  Baseline:  Goal status: IN PROGRESS  2.  Patient will improve L shoulder P/AAROM to >/= 115 flexion, 90 abduction and 45 ER w/o increased pain to promote more functional L UE use.  Baseline:  Goal status: IN PROGRESS  LONG TERM GOALS: Target date: 01/17/2023  Patient will be independent with ongoing/advanced HEP for self-management at home.  Baseline:  Goal status: IN PROGRESS  2.  Patient will report 50-75% improvement in L shoulder pain to improve QOL.  Baseline:  Goal status: IN PROGRESS  3.  Patient to demonstrate improved upright posture with posterior shoulder girdle engaged to promote improved glenohumeral joint mobility. Baseline:  Goal status: IN PROGRESS  4.  Patient to improve L shoulder AROM to [facility] without pain provocation to allow for increased ease of ADLs.  Baseline:  Goal status: IN PROGRESS  5.  Patient will demonstrate improved L shoulder strength to >/= 4/5 for functional UE use. Baseline:  Goal status: IN PROGRESS  6  Patient will report </=  55% on QuickDASH to demonstrate improved functional ability.  Baseline: 65.9 / 100 = 65.9 % Goal status: IN PROGRESS   PLAN:  PT FREQUENCY: 2x/week  PT DURATION: 8 weeks  PLANNED INTERVENTIONS: Therapeutic exercises, Therapeutic activity, Neuromuscular re-education, Balance training, Gait training, Patient/Family education, Self Care, Joint mobilization, Dry Needling, Electrical stimulation, Cryotherapy, Moist heat, scar mobilization, Taping, Vasopneumatic device, Ionotophoresis '4mg'$ /ml Dexamethasone, Manual therapy, and Re-evaluation  PLAN FOR NEXT SESSION: L shoulder P/AAROM per protocol; L shoulder ABD & ER isometrics; supine AROM "upper cut" & "hitchhiker"; MT and modalities as needed/tolerated to address pain and abnormal muscle tension   Tameca Jerez L Paola Flynt, PTA 12/02/2022, 3:37 PM

## 2022-12-06 ENCOUNTER — Ambulatory Visit: Payer: Self-pay | Admitting: Licensed Clinical Social Worker

## 2022-12-06 ENCOUNTER — Encounter: Payer: Self-pay | Admitting: Physical Therapy

## 2022-12-06 ENCOUNTER — Ambulatory Visit: Payer: Medicare Other | Admitting: Physical Therapy

## 2022-12-06 DIAGNOSIS — M25512 Pain in left shoulder: Secondary | ICD-10-CM | POA: Diagnosis not present

## 2022-12-06 DIAGNOSIS — R293 Abnormal posture: Secondary | ICD-10-CM

## 2022-12-06 DIAGNOSIS — G8929 Other chronic pain: Secondary | ICD-10-CM

## 2022-12-06 DIAGNOSIS — M25612 Stiffness of left shoulder, not elsewhere classified: Secondary | ICD-10-CM | POA: Diagnosis not present

## 2022-12-06 DIAGNOSIS — M6281 Muscle weakness (generalized): Secondary | ICD-10-CM | POA: Diagnosis not present

## 2022-12-06 NOTE — Therapy (Signed)
OUTPATIENT PHYSICAL THERAPY TREATMENT   Patient Name: MARGARETTE VANNATTER MRN: [id number] DOB:[birth year], 89 y.o., female Today's Date: 12/06/2022   END OF SESSION:  PT End of Session - 12/06/22 1353     Visit Number 5    Date for PT Re-Evaluation 01/17/23    Authorization Type Medicare & BCBS    PT Start Time 1355    PT Stop Time 1442    PT Time Calculation (min) 47 min    Activity Tolerance Patient tolerated treatment well    Behavior During Therapy WFL for tasks assessed/performed               Past Medical History:  Diagnosis Date   Allergy    Anemia    past hx    Arthropathy of cervical spine 11/27/2012   Right  Xray at [facility]  On 04/08/2016  AP, lateral, and lateral flexion and extension views of the cervical spine are submitted for evaluation.   No acute fracture identified in the cervical spine.   There is redemonstration of approximately 2 mm of C3 on C4 anterolisthesis in neutral which slightly increases in flexion relative to neutral and extension. Slight C5 on C6 retrolisthesis does not change i   Benign hypertension without congestive heart failure    Benign mole    right hcets mole, bleeds when dried with towel   Bilateral dry eyes    Bladder cancer ([place]) 11/10/16   Cervical spondylosis    Cervicalgia    2 screws in place    Depression with anxiety 11-10-16   prior to husbands death    Dyspnea    Gross hematuria    developed after first hip replacment, led to indicental finding of bladder tumor, bleeding now resolved    H/O total shoulder replacement, left    History of kidney stones    1970's   Left hand weakness    due to reinjury of flxor tendon s/p tendon surgery march 12-2018   Lumbar stenosis    Malignant tumor of urinary bladder ([place]) 07/2016   Pre-stage I   OA (osteoarthritis)    Parkinson disease dx 2012   neurologist-  dr siddiqui at [place] [facility])     Dr Trudie Reed , [place] [facility]    Rupture of flexor tendon of hand  2020   right   Past Surgical History:  Procedure Laterality Date   BIOPSY MASS LEFT FIRST METACARPAL   06/23/2006   benign   CATARACT EXTRACTION W/ INTRAOCULAR LENS  IMPLANT, BILATERAL  1999 and 2003   CERVICAL FUSION  02/2017   c1-c2 ; reports she has 2 screws 2in long in place done at [facility] ; patient exhibits VERY LIMITED NECK ROM   COLONOSCOPY     CYSTOSCOPY W/ RETROGRADES Bilateral 09/22/2016   Procedure: CYSTOSCOPY WITH RETROGRADE PYELOGRAM;  Surgeon: Alexis Frock, MD;  Location[facility];  Service: Urology;  Laterality: Bilateral;   D & C HYSTEROSCOPY W/ RESECTION POLYP  07/11/2000   DILATION AND CURETTAGE OF UTERUS  1960's   EXCISION CYST AND DEBRIDEMENT RIGHT WIRST AND REMOVAL FORGEIGN BODY  01/09/2009   FLEXOR TENDON REPAIR Left 12/12/2018   Procedure: REPAIR/TRANSFER FLEXOR DIGITORUM PROFUNDUS OF LEFT SMALL FINGER;  Surgeon: Daryll Brod, MD;  Location: [facility];  Service: Orthopedics;  Laterality: Left;   LAPAROSCOPY  yrs ago   infertility    METACARPOPHALANGEAL JOINT ARTHRODESIS Right 05/25/1996   REVERSE SHOULDER ARTHROPLASTY Left 10/14/2022  Procedure: REVERSE SHOULDER ARTHROPLASTY;  Surgeon: Justice Britain, MD;  Location: [facility];  Service: Orthopedics;  Laterality: Left;  117mn   TENDON REPAIR Left 01/15/2019   Hand   TONSILLECTOMY AND ADENOIDECTOMY  child   TOTAL HIP ARTHROPLASTY Left 07/07/2016   Procedure: LEFT TOTAL HIP ARTHROPLASTY ANTERIOR APPROACH;  Surgeon: FGaynelle Arabian MD;  Location: [facility];  Service: Orthopedics;  Laterality: Left;   TOTAL HIP ARTHROPLASTY Right 09/28/2017   Procedure: RIGHT TOTAL HIP ARTHROPLASTY ANTERIOR APPROACH;  Surgeon: AGaynelle Arabian MD;  Location: [facility];  Service: Orthopedics;  Laterality: Right;   TOTAL KNEE ARTHROPLASTY Right 07/30/2019   Procedure: TOTAL KNEE ARTHROPLASTY;  Surgeon: AGaynelle Arabian MD;  Location: [facility];  Service: Orthopedics;  Laterality: Right;  511m   TRANSURETHRAL  RESECTION OF BLADDER TUMOR N/A 09/22/2016   Procedure: TRANSURETHRAL RESECTION OF BLADDER TUMOR (TURBT);  Surgeon: ThAlexis FrockMD;  [facility] Service: Urology;  Laterality: N/A;   Patient Active Problem List   Diagnosis Date Noted   S/P reverse total shoulder arthroplasty, left 10/14/2022   Preoperative cardiovascular examination 10/04/2022   SOB (shortness of breath) 08/02/2022   Insomnia 08/02/2022   Hyponatremia 08/01/2022   Shoulder pain, left 09/14/2021   History of COVID-19 01/06/2021   Diarrhea 09/03/2020   Vitamin D deficiency 09/03/2020   OA (osteoarthritis) of knee 07/30/2019   Tinea corporis 06/08/2019   Psoriatic arthritis ([place]/18/2020   Anemia 03/13/2018   Hot flashes 03/13/2018   S/P cervical spinal fusion 03/31/2017   Neck pain 12/12/2016   Depression with anxiety 10/17/2016   Bladder cancer ([place]/01/2016   OA (osteoarthritis) of hip 07/07/2016   Spinal stenosis of lumbar region with radiculopathy 04/08/2016   Spondylolisthesis of lumbar region 04/08/2016   Spondylosis of cervical region without myelopathy or radiculopathy 04/08/2016   Preventative health care 01/18/2016   Low back pain 01/18/2016   History of chicken pox    Hyperlipidemia 12/15/2014   Allergic rhinitis 06/25/2014   Allergy to bee sting 06/10/2014   TMJ tenderness 11/16/2013   Other malaise and fatigue 05/07/2013   Arthropathy of cervical spine ([facility]/13/2014   Muscle spasms of neck 11/27/2012   Parkinson's disease 05/24/2012   Conjunctivochalasis 03/09/2012   Epiretinal membrane 03/09/2012   Hyperopia with astigmatism and presbyopia 03/09/2012   Pseudophakia 03/09/2012   HTN (hypertension) 08/31/2011   Right knee pain 08/31/2011   Benign hypertensive heart disease without heart failure 05/12/2011    PCP: BlMosie LukesMD   REFERRING PROVIDER: SuJustice BritainMD   REFERRING DIAG: [id number]-10-CM) - Presence of left artificial shoulder  joint   THERAPY DIAG:  Stiffness of left shoulder, not elsewhere classified  Chronic left shoulder pain  Abnormal posture  Muscle weakness (generalized)  RATIONALE FOR EVALUATION AND TREATMENT: Rehabilitation  ONSET DATE: 10/14/2022 - L reverse TSA  NEXT MD VISIT: ~01/04/23   SUBJECTIVE:  SUBJECTIVE STATEMENT: Does not feel well today and has not been sleeping well due to pain in her tricep and neck. Has a tooth infection and is having surgery tomorrow for it. Feels nauseous today.   PAIN: Are you having pain? Yes: NPRS scale: 7/10 Pain location: L neck & upper shoulder, arm  Pain description: sharp and dull, constant, clicking Aggravating factors: constant Relieving factors: ice, heat, rest  PERTINENT HISTORY:  L shoulder advanced OA - reverse TSA on 10/14/22; R TKA 07/2019, B THR 2018 & 2017, posterior C1-2 cervical fusion 2018, rupture of flexor tendon of L hand with failed surgical repair 2020, Parkinson's, bladder cancer, HTN    PRECAUTIONS: Shoulder  WEIGHT BEARING RESTRICTIONS: No  FALLS:  Has patient fallen in last 6 months? No  LIVING ENVIRONMENT: Lives with: lives alone Lives in: House/apartment Stairs: Yes: External: 4 steps; on right going up, on left going up, and can reach both Has following equipment at home: Single point cane, Walker - 2 wheeled, shower chair, bed side commode, and Grab bars  OCCUPATION: Retired  PLOF: Independent and Leisure: volunteering at [employer], bible study, walking 1/3 mile daily  PATIENT GOALS: "To get rid of my pain and be able to do things with both hands."   OBJECTIVE:   DIAGNOSTIC FINDINGS:  09/14/21 - L shoulder x-ray: IMPRESSION: 1. No acute bony abnormality. 2. Mild/moderate degenerative changes.  PATIENT SURVEYS:   Quick Dash 65.9 / 100 = 65.9 %  COGNITION: Overall cognitive status: Within functional limits for tasks assessed     SENSATION: WFL  POSTURE: rounded shoulders, forward head, weight shift right, and scoliosis  UPPER EXTREMITY ROM:   Active ROM Right eval Left eval  Shoulder flexion 152 89 *  Shoulder extension    Shoulder abduction 144 55 *  Shoulder adduction    Shoulder internal rotation    Shoulder external rotation 71 45 *  Elbow flexion    Elbow extension    Wrist flexion    Wrist extension    Wrist ulnar deviation    Wrist radial deviation    Wrist pronation    Wrist supination     Passive ROM Left eval  Shoulder flexion 91  Shoulder extension   Shoulder abduction 64  Shoulder adduction   Shoulder internal rotation 31 at 30 ABD  Shoulder external rotation   Elbow flexion   Elbow extension   Wrist flexion   Wrist extension   Wrist ulnar deviation   Wrist radial deviation   Wrist pronation   Wrist supination   (Blank rows = not tested) * = pain  UPPER EXTREMITY MMT:  (deferred on eval)  MMT Right eval Left eval  Shoulder flexion    Shoulder extension    Shoulder abduction    Shoulder adduction    Shoulder internal rotation    Shoulder external rotation    Middle trapezius    Lower trapezius    Elbow flexion    Elbow extension    Wrist flexion    Wrist extension    Wrist ulnar deviation    Wrist radial deviation    Wrist pronation    Wrist supination    Grip strength (lbs)    (Blank rows = not tested)  PALPATION:  TTP with taut bands and TPs in L posterolateral shoulder and pecs    TODAY'S TREATMENT:   12/06/22 THERAPEUTIC EXERCISE: to improve flexibility, strength and mobility.  Verbal and tactile cues throughout for technique.  Pulleys: L shoulder flexion &  scaption x 3 min each Seated Scap Retraction AROM x10, YTB x10 Supine L uppercut 2x10  Supine L hitchhiker 2x10 Standing Isometric ER, Abd x10 for 5" hold   Manual  Therapy: STM and TPR to L UT and LS, Supraspinatus, Infraspinatus  MODALITIES: IFC to L UT & Infraspinatus , 80-150 Hz, intensity to patient tolerance x 15 min  Moist heat to L upper shoulder/L lateral neck x 15 min    12/02/22 THERAPEUTIC EXERCISE: to improve flexibility, strength and mobility.  Verbal and tactile cues throughout for technique.  Pulleys: L shoulder flexion & scaption x 3 min each Seated scap retraction 2x10 Supine L uppercut 2x10  Supine L hitchhiker 2x10  Manual Therapy: STM and TPR to L UT and LS  MODALITIES: IFC to L UT , 80-150 Hz, intensity to patient tolerance x 10 min    11/29/22 THERAPEUTIC EXERCISE: to improve flexibility, strength and mobility.  Verbal and tactile cues throughout for technique.  Pulleys: L shoulder flexion & scaption x 3 min each Seated L shoulder ER isometrics 10 x 5" - submax avoiding increased pain at scapula/neck Seated L shoulder ABD isometrics 10 x 5"  Seated B scapular retraction 10 x 3", 2 sets Seated L shoulder ABD rollout with blue/green on mat table 10 x 3" Hooklying L shoulder flexion "upper cut" AROM 2 x 10 Hooklying L shoulder abduction/scaption "hitch hiker" AROM 2 x 10 Standing pendulums: Flexion/extension x 30" Horizontal ABD/ADD x 30" CW/CCW x 30" each Standing L shoulder ER isometric - attempted at doorframe but deferred 2 increased pain   MANUAL THERAPY: To promote normalized muscle tension and reduced pain. Palpation identifying increased TTP over L infraspinatus, biceps and UT/LS  MODALITIES: IFC to L shoulder complex, 80-150 Hz, intensity to patient tolerance x 15 min  Moist heat to L upper shoulder/L lateral neck x 15 min   PATIENT EDUCATION:  Education details: ongoing PT POC - review of cervical retraction and UT stretch from prior PT episode Person educated: Patient Education method: Explanation Education comprehension: verbalized understanding   HOME EXERCISE PROGRAM: Access Code:  [email or web address] URL: [email or web address] Date: 11/29/2022 Prepared by: Annie Paras  Exercises - Supine Shoulder Flexion AAROM with Dowel  - 2 x daily - 7 x weekly - 2 sets - 10 reps - 3 sec hold - Seated Shoulder Abduction AAROM with Dowel  - 2 x daily - 7 x weekly - 2 sets - 10 reps - 3 sec hold - Seated Shoulder External Rotation AAROM with Cane and Hand in Neutral (Mirrored)  - 2 x daily - 7 x weekly - 2 sets - 10 reps - 3 sec hold - Isometric Shoulder External Rotation  - 1 x daily - 7 x weekly - 2 sets - 10 reps - 5 sec hold - Isometric Shoulder Abduction  - 1 x daily - 7 x weekly - 2 sets - 10 reps - 3 sec hold   ASSESSMENT:  CLINICAL IMPRESSION: Pt came into today's session not feeling well due to increased pain that is also affecting her sleep. Pt also believes the muscle relaxants prescribed by her MD haven't been working too well recently. Pt was sore and felt some pain with the different ROM exercises but this did not prohibit her from finishing. There were numerous TTP along her upper traps as well as her scapula but there was an improvement in muscle tension after the STM. Pt will continue to benefit from skilled PT interventions as she progresses through her POC.  OBJECTIVE IMPAIRMENTS: decreased activity tolerance, decreased mobility, decreased ROM, decreased strength, increased fascial restrictions, impaired perceived functional ability, increased muscle spasms, impaired flexibility, impaired UE functional use, improper body mechanics, postural dysfunction, and pain.   ACTIVITY LIMITATIONS: carrying, lifting, bed mobility, bathing, dressing, reach over head, and hygiene/grooming  PARTICIPATION LIMITATIONS: meal prep, cleaning, laundry, driving, shopping, community activity, and church  PERSONAL FACTORS: Age, Fitness, Past/current experiences, Time since onset of injury/illness/exacerbation, and 3+ comorbidities: R TKA 07/2019, B THR 2018 & 2017, posterior C1-2  cervical fusion 2018, rupture of flexor tendon of L hand with failed surgical repair 2020, Parkinson's, bladder cancer, HTN  are also affecting patient's functional outcome.   REHAB POTENTIAL: Good  CLINICAL DECISION MAKING: Evolving/moderate complexity  EVALUATION COMPLEXITY: Low   GOALS: Goals reviewed with patient? Yes  SHORT TERM GOALS: Target date: 12/20/2022  Patient will be independent with initial HEP to improve outcomes and carryover.  Baseline:  Goal status: IN PROGRESS  2.  Patient will improve L shoulder P/AAROM to >/= 115 flexion, 90 abduction and 45 ER w/o increased pain to promote more functional L UE use.  Baseline:  Goal status: IN PROGRESS  LONG TERM GOALS: Target date: 01/17/2023  Patient will be independent with ongoing/advanced HEP for self-management at home.  Baseline:  Goal status: IN PROGRESS  2.  Patient will report 50-75% improvement in L shoulder pain to improve QOL.  Baseline:  Goal status: IN PROGRESS  3.  Patient to demonstrate improved upright posture with posterior shoulder girdle engaged to promote improved glenohumeral joint mobility. Baseline:  Goal status: IN PROGRESS  4.  Patient to improve L shoulder AROM to [facility] without pain provocation to allow for increased ease of ADLs.  Baseline:  Goal status: IN PROGRESS  5.  Patient will demonstrate improved L shoulder strength to >/= 4/5 for functional UE use. Baseline:  Goal status: IN PROGRESS  6  Patient will report </= 55% on QuickDASH to demonstrate improved functional ability.  Baseline: 65.9 / 100 = 65.9 % Goal status: IN PROGRESS   PLAN:  PT FREQUENCY: 2x/week  PT DURATION: 8 weeks  PLANNED INTERVENTIONS: Therapeutic exercises, Therapeutic activity, Neuromuscular re-education, Balance training, Gait training, Patient/Family education, Self Care, Joint mobilization, Dry Needling, Electrical stimulation, Cryotherapy, Moist heat, scar mobilization, Taping, Vasopneumatic device,  Ionotophoresis '4mg'$ /ml Dexamethasone, Manual therapy, and Re-evaluation  PLAN FOR NEXT SESSION: Keep performing L shoulder P/AAROM per protocol; L shoulder ABD & ER isometrics, MT and modalities as needed/tolerated to address pain and abnormal muscle tension   Chelli Yerkes Romero-Perozo, Student-PT 12/06/2022, 3:05 PM

## 2022-12-06 NOTE — Patient Outreach (Signed)
  Care Coordination   12/06/2022 Name: LORAL CAMPI MRN: [id number] DOB: 1932/03/25   Care Coordination Outreach Attempts:  An unsuccessful telephone outreach was attempted today to offer the patient information about available care coordination services as a benefit of their health plan.   Follow Up Plan:  Additional outreach attempts will be made to offer the patient care coordination information and services.   Encounter Outcome:  No Answer   Care Coordination Interventions:  No, not indicated  {THN Tip this will not be part of the note when signed-REQUIRED REPORT FIELD DO NOT DELETE (Optional):[id number]  Norva Riffle.Darin Arndt MSW, [place] Holiday representative [facility] Care Management [phone]

## 2022-12-09 ENCOUNTER — Ambulatory Visit: Payer: Medicare Other | Admitting: Physical Therapy

## 2022-12-09 ENCOUNTER — Encounter: Payer: Self-pay | Admitting: Physical Therapy

## 2022-12-09 DIAGNOSIS — M25612 Stiffness of left shoulder, not elsewhere classified: Secondary | ICD-10-CM

## 2022-12-09 DIAGNOSIS — R293 Abnormal posture: Secondary | ICD-10-CM | POA: Diagnosis not present

## 2022-12-09 DIAGNOSIS — M6281 Muscle weakness (generalized): Secondary | ICD-10-CM

## 2022-12-09 DIAGNOSIS — G8929 Other chronic pain: Secondary | ICD-10-CM

## 2022-12-09 DIAGNOSIS — M25512 Pain in left shoulder: Secondary | ICD-10-CM | POA: Diagnosis not present

## 2022-12-09 NOTE — Therapy (Signed)
OUTPATIENT PHYSICAL THERAPY TREATMENT   Patient Name: Stephanie [name] MRN: [id number] DOB:Oct 30, 1932, 89 y.o., female Today's Date: 12/09/2022   END OF SESSION:  PT End of Session - 12/09/22 1359     Visit Number 6    Date for PT Re-Evaluation 01/17/23    Authorization Type Medicare & BCBS    PT Start Time 1400    PT Stop Time 1505    PT Time Calculation (min) 65 min    Activity Tolerance Patient tolerated treatment well    Behavior During Therapy WFL for tasks assessed/performed                Past Medical History:  Diagnosis Date   Allergy    Anemia    past hx    Arthropathy of cervical spine 11/27/2012   Right  Xray at [facility]  On 04/08/2016  AP, lateral, and lateral flexion and extension views of the cervical spine are submitted for evaluation.   No acute fracture identified in the cervical spine.   There is redemonstration of approximately 2 mm of C3 on C4 anterolisthesis in neutral which slightly increases in flexion relative to neutral and extension. Slight C5 on C6 retrolisthesis does not change i   Benign hypertension without congestive heart failure    Benign mole    right hcets mole, bleeds when dried with towel   Bilateral dry eyes    Bladder cancer ([place]) 2016-10-19   Cervical spondylosis    Cervicalgia    2 screws in place    Depression with anxiety Oct 19, 2016   prior to husbands death    Dyspnea    Gross hematuria    developed after first hip replacment, led to indicental finding of bladder tumor, bleeding now resolved    H/O total shoulder replacement, left    History of kidney stones    1970's   Left hand weakness    due to reinjury of flxor tendon s/p tendon surgery march 12-2018   Lumbar stenosis    Malignant tumor of urinary bladder ([place]) 07/2016   Pre-stage I   OA (osteoarthritis)    Parkinson disease dx 2012   neurologist-  dr siddiqui at [facility])     Dr Trudie Reed , [place] [facility]    Rupture of flexor tendon of hand  2020   right   Past Surgical History:  Procedure Laterality Date   BIOPSY MASS LEFT FIRST METACARPAL   06/23/2006   benign   CATARACT EXTRACTION W/ INTRAOCULAR LENS  IMPLANT, BILATERAL  1999 and 2003   CERVICAL FUSION  02/2017   c1-c2 ; reports she has 2 screws 2in long in place done at [facility] ; patient exhibits VERY LIMITED NECK ROM   COLONOSCOPY     CYSTOSCOPY W/ RETROGRADES Bilateral 09/22/2016   Procedure: CYSTOSCOPY WITH RETROGRADE PYELOGRAM;  Surgeon: Alexis Frock, MD;  Location: [facility];  Service: Urology;  Laterality: Bilateral;   D & C HYSTEROSCOPY W/ RESECTION POLYP  07/11/2000   DILATION AND CURETTAGE OF UTERUS  1960's   EXCISION CYST AND DEBRIDEMENT RIGHT WIRST AND REMOVAL FORGEIGN BODY  01/09/2009   FLEXOR TENDON REPAIR Left 12/12/2018   Procedure: REPAIR/TRANSFER FLEXOR DIGITORUM PROFUNDUS OF LEFT SMALL FINGER;  Surgeon: Daryll Brod, MD;  Location: [place];  Service: Orthopedics;  Laterality: Left;   LAPAROSCOPY  yrs ago   infertility    METACARPOPHALANGEAL JOINT ARTHRODESIS Right 05/25/1996   REVERSE SHOULDER ARTHROPLASTY Left  10/14/2022   Procedure: REVERSE SHOULDER ARTHROPLASTY;  Surgeon: Justice Britain, MD;  Location: [facility];  Service: Orthopedics;  Laterality: Left;  132mn   TENDON REPAIR Left 01/15/2019   Hand   TONSILLECTOMY AND ADENOIDECTOMY  child   TOTAL HIP ARTHROPLASTY Left 07/07/2016   Procedure: LEFT TOTAL HIP ARTHROPLASTY ANTERIOR APPROACH;  Surgeon: FGaynelle Arabian MD;  Location: [facility];  Service: Orthopedics;  Laterality: Left;   TOTAL HIP ARTHROPLASTY Right 09/28/2017   Procedure: RIGHT TOTAL HIP ARTHROPLASTY ANTERIOR APPROACH;  Surgeon: AGaynelle Arabian MD;  Location: [facility];  Service: Orthopedics;  Laterality: Right;   TOTAL KNEE ARTHROPLASTY Right 07/30/2019   Procedure: TOTAL KNEE ARTHROPLASTY;  Surgeon: AGaynelle Arabian MD;  Location: [facility];  Service: Orthopedics;  Laterality: Right;  554m   TRANSURETHRAL  RESECTION OF BLADDER TUMOR N/A 09/22/2016   Procedure: TRANSURETHRAL RESECTION OF BLADDER TUMOR (TURBT);  Surgeon: ThAlexis FrockMD;  [facility] Service: Urology;  Laterality: N/A;   Patient Active Problem List   Diagnosis Date Noted   S/P reverse total shoulder arthroplasty, left 10/14/2022   Preoperative cardiovascular examination 10/04/2022   SOB (shortness of breath) 08/02/2022   Insomnia 08/02/2022   Hyponatremia 08/01/2022   Shoulder pain, left 09/14/2021   History of COVID-19 01/06/2021   Diarrhea 09/03/2020   Vitamin D deficiency 09/03/2020   OA (osteoarthritis) of knee 07/30/2019   Tinea corporis 06/08/2019   Psoriatic arthritis ([place]/18/2020   Anemia 03/13/2018   Hot flashes 03/13/2018   S/P cervical spinal fusion 03/31/2017   Neck pain 12/12/2016   Depression with anxiety 10/17/2016   Bladder cancer ([place]/01/2016   OA (osteoarthritis) of hip 07/07/2016   Spinal stenosis of lumbar region with radiculopathy 04/08/2016   Spondylolisthesis of lumbar region 04/08/2016   Spondylosis of cervical region without myelopathy or radiculopathy 04/08/2016   Preventative health care 01/18/2016   Low back pain 01/18/2016   History of chicken pox    Hyperlipidemia 12/15/2014   Allergic rhinitis 06/25/2014   Allergy to bee sting 06/10/2014   TMJ tenderness 11/16/2013   Other malaise and fatigue 05/07/2013   Arthropathy of cervical spine ([place]/13/2014   Muscle spasms of neck 11/27/2012   Parkinson's disease 05/24/2012   Conjunctivochalasis 03/09/2012   Epiretinal membrane 03/09/2012   Hyperopia with astigmatism and presbyopia 03/09/2012   Pseudophakia 03/09/2012   HTN (hypertension) 08/31/2011   Right knee pain 08/31/2011   Benign hypertensive heart disease without heart failure 05/12/2011    PCP: BlMosie LukesMD   REFERRING PROVIDER: SuJustice BritainMD   REFERRING DIAG: [phone]-10-CM) - Presence of left artificial shoulder  joint   THERAPY DIAG:  Stiffness of left shoulder, not elsewhere classified  Chronic left shoulder pain  Abnormal posture  Muscle weakness (generalized)  RATIONALE FOR EVALUATION AND TREATMENT: Rehabilitation  ONSET DATE: 10/14/2022 - L reverse TSA  NEXT MD VISIT: ~01/04/23   SUBJECTIVE:  SUBJECTIVE STATEMENT:  Neck feels really sore after dentist visit on Tuesday due to having to lean back in the chair. Keeps not being able to sleep well due to neck pain waking her up every two hours. Was not able to do much exercises due to the dentist procedure done.  PAIN: Are you having pain? Yes: NPRS scale: 7/10 Pain location: L neck  Pain description: sharp and dull, constant Aggravating factors: constant Relieving factors: ice, heat, rest  PERTINENT HISTORY:  L shoulder advanced OA - reverse TSA on 10/14/22; R TKA 07/2019, B THR 2018 & 2017, posterior C1-2 cervical fusion 2018, rupture of flexor tendon of L hand with failed surgical repair 2020, Parkinson's, bladder cancer, HTN    PRECAUTIONS: Shoulder  WEIGHT BEARING RESTRICTIONS: No  FALLS:  Has patient fallen in last 6 months? No  LIVING ENVIRONMENT: Lives with: lives alone Lives in: House/apartment Stairs: Yes: External: 4 steps; on right going up, on left going up, and can reach both Has following equipment at home: Single point cane, Walker - 2 wheeled, shower chair, bed side commode, and Grab bars  OCCUPATION: Retired  PLOF: Independent and Leisure: volunteering at [employer], bible study, walking 1/3 mile daily  PATIENT GOALS: "To get rid of my pain and be able to do things with both hands."   OBJECTIVE:   DIAGNOSTIC FINDINGS:  09/14/21 - L shoulder x-ray: IMPRESSION: 1. No acute bony abnormality. 2. Mild/moderate  degenerative changes.  PATIENT SURVEYS:  Quick Dash 65.9 / 100 = 65.9 %  COGNITION: Overall cognitive status: Within functional limits for tasks assessed     SENSATION: WFL  POSTURE: rounded shoulders, forward head, weight shift right, and scoliosis  UPPER EXTREMITY ROM:   Active ROM Right eval Left eval  Shoulder flexion 152 89 *  Shoulder extension    Shoulder abduction 144 55 *  Shoulder adduction    Shoulder internal rotation    Shoulder external rotation 71 45 *  Elbow flexion    Elbow extension    Wrist flexion    Wrist extension    Wrist ulnar deviation    Wrist radial deviation    Wrist pronation    Wrist supination     Passive ROM Left eval  Shoulder flexion 91  Shoulder extension   Shoulder abduction 64  Shoulder adduction   Shoulder internal rotation 31 at 30 ABD  Shoulder external rotation   Elbow flexion   Elbow extension   Wrist flexion   Wrist extension   Wrist ulnar deviation   Wrist radial deviation   Wrist pronation   Wrist supination   (Blank rows = not tested) * = pain  UPPER EXTREMITY MMT:  (deferred on eval)  MMT Right eval Left eval  Shoulder flexion    Shoulder extension    Shoulder abduction    Shoulder adduction    Shoulder internal rotation    Shoulder external rotation    Middle trapezius    Lower trapezius    Elbow flexion    Elbow extension    Wrist flexion    Wrist extension    Wrist ulnar deviation    Wrist radial deviation    Wrist pronation    Wrist supination    Grip strength (lbs)    (Blank rows = not tested)  PALPATION:  TTP with taut bands and TPs in L posterolateral shoulder and pecs    TODAY'S TREATMENT:   12/09/22 THERAPEUTIC EXERCISE: to improve flexibility, strength and mobility.  Verbal and  tactile cues throughout for technique.  Pulleys: L shoulder flexion & scaption x 3 min each Seated Scap Retraction AROM x10, YTB x10 Standing Isometric ER, Abd, Flx, Ext x10 for 5" hold Attempted  seated wand flexion AAROM but deferred due to increased discomfort Seated L Uppercut 2x10  Seated L Hitchhiker 2x10  AAROM ER with PVC x10 (to 45)   Manual Therapy: STM and TPR to L UT and LS  MODALITIES: IFC to L UT & Infraspinatus , 80-150 Hz, intensity to patient tolerance x 15 min  Moist heat to L lateral neck x 15 min    12/06/22 THERAPEUTIC EXERCISE: to improve flexibility, strength and mobility.  Verbal and tactile cues throughout for technique.  Pulleys: L shoulder flexion & scaption x 3 min each Seated Scap Retraction AROM x10, YTB x10 Supine L uppercut 2x10  Supine L hitchhiker 2x10 Standing Isometric ER, Abd x10 for 5" hold   Manual Therapy: STM and TPR to L UT and LS, Supraspinatus, Infraspinatus  MODALITIES: IFC to L UT & Infraspinatus , 80-150 Hz, intensity to patient tolerance x 15 min  Moist heat to L upper shoulder/L lateral neck x 15 min    12/02/22 THERAPEUTIC EXERCISE: to improve flexibility, strength and mobility.  Verbal and tactile cues throughout for technique.  Pulleys: L shoulder flexion & scaption x 3 min each Seated scap retraction 2x10 Supine L uppercut 2x10  Supine L hitchhiker 2x10 Standing pendulums: Flexion/extension x 30" Horizontal ABD/ADD x 30" CW/CCW x 30" each  Manual Therapy: STM and TPR to L UT and LS  MODALITIES: IFC to L UT , 80-150 Hz, intensity to patient tolerance x 10 min     PATIENT EDUCATION:  Education details: ongoing PT POC  Person educated: Patient Education method: Explanation Education comprehension: verbalized understanding   HOME EXERCISE PROGRAM: Access Code: [email or web address] URL: [email or web address] Date: 11/29/2022 Prepared by: Annie Paras  Exercises - Supine Shoulder Flexion AAROM with Dowel  - 2 x daily - 7 x weekly - 2 sets - 10 reps - 3 sec hold - Seated Shoulder Abduction AAROM with Dowel  - 2 x daily - 7 x weekly - 2 sets - 10 reps - 3 sec hold - Seated Shoulder External Rotation  AAROM with Cane and Hand in Neutral (Mirrored)  - 2 x daily - 7 x weekly - 2 sets - 10 reps - 3 sec hold - Isometric Shoulder External Rotation  - 1 x daily - 7 x weekly - 2 sets - 10 reps - 5 sec hold - Isometric Shoulder Abduction  - 1 x daily - 7 x weekly - 2 sets - 10 reps - 3 sec hold   ASSESSMENT:  CLINICAL IMPRESSION:  Pt came into today's session with a very sore neck due to dental procedure done on Tuesday. Pt did not have the chance to do much of her HEP after the procedure so that's why she believes that the one time she did do them was why she felt sore afterwards. Pt performed isometric exercises with minimal pain and the ROM exercises brought on some of her pain but we were able to readjust and her pain decreased. Once again, numerous TTP were found along her upper trap and LS area but there was a decrease in muscle tension after STM. Pt will continue to benefit from skilled PT interventions as she progresses through her POC.   OBJECTIVE IMPAIRMENTS: decreased activity tolerance, decreased mobility, decreased ROM, decreased strength, increased fascial restrictions, impaired perceived functional  ability, increased muscle spasms, impaired flexibility, impaired UE functional use, improper body mechanics, postural dysfunction, and pain.   ACTIVITY LIMITATIONS: carrying, lifting, bed mobility, bathing, dressing, reach over head, and hygiene/grooming  PARTICIPATION LIMITATIONS: meal prep, cleaning, laundry, driving, shopping, community activity, and church  PERSONAL FACTORS: Age, Fitness, Past/current experiences, Time since onset of injury/illness/exacerbation, and 3+ comorbidities: R TKA 07/2019, B THR 2018 & 2017, posterior C1-2 cervical fusion 2018, rupture of flexor tendon of L hand with failed surgical repair 2020, Parkinson's, bladder cancer, HTN  are also affecting patient's functional outcome.   REHAB POTENTIAL: Good  CLINICAL DECISION MAKING: Evolving/moderate  complexity  EVALUATION COMPLEXITY: Low   GOALS: Goals reviewed with patient? Yes  SHORT TERM GOALS: Target date: 12/20/2022  Patient will be independent with initial HEP to improve outcomes and carryover.  Baseline:  Goal status: IN PROGRESS  2.  Patient will improve L shoulder P/AAROM to >/= 115 flexion, 90 abduction and 45 ER w/o increased pain to promote more functional L UE use.  Baseline:  Goal status: IN PROGRESS  LONG TERM GOALS: Target date: 01/17/2023  Patient will be independent with ongoing/advanced HEP for self-management at home.  Baseline:  Goal status: IN PROGRESS  2.  Patient will report 50-75% improvement in L shoulder pain to improve QOL.  Baseline:  Goal status: IN PROGRESS  3.  Patient to demonstrate improved upright posture with posterior shoulder girdle engaged to promote improved glenohumeral joint mobility. Baseline:  Goal status: IN PROGRESS  4.  Patient to improve L shoulder AROM to [facility] without pain provocation to allow for increased ease of ADLs.  Baseline:  Goal status: IN PROGRESS  5.  Patient will demonstrate improved L shoulder strength to >/= 4/5 for functional UE use. Baseline:  Goal status: IN PROGRESS  6  Patient will report </= 55% on QuickDASH to demonstrate improved functional ability.  Baseline: 65.9 / 100 = 65.9 % Goal status: IN PROGRESS   PLAN:  PT FREQUENCY: 2x/week  PT DURATION: 8 weeks  PLANNED INTERVENTIONS: Therapeutic exercises, Therapeutic activity, Neuromuscular re-education, Balance training, Gait training, Patient/Family education, Self Care, Joint mobilization, Dry Needling, Electrical stimulation, Cryotherapy, Moist heat, scar mobilization, Taping, Vasopneumatic device, Ionotophoresis '4mg'$ /ml Dexamethasone, Manual therapy, and Re-evaluation  PLAN FOR NEXT SESSION: Ask how isometric HEP went, keep performing L shoulder P/AA/AROM per protocol; L shoulder ABD,ER,FLX,EXT, ADD isometrics, MT and modalities as  needed/tolerated to address pain and abnormal muscle tension   Braya Habermehl Romero-Perozo, Student-PT 12/09/2022, 3:00 PM

## 2022-12-13 ENCOUNTER — Ambulatory Visit: Payer: Medicare Other | Admitting: Physical Therapy

## 2022-12-13 ENCOUNTER — Encounter: Payer: Self-pay | Admitting: Physical Therapy

## 2022-12-13 DIAGNOSIS — M25612 Stiffness of left shoulder, not elsewhere classified: Secondary | ICD-10-CM | POA: Diagnosis not present

## 2022-12-13 DIAGNOSIS — R293 Abnormal posture: Secondary | ICD-10-CM | POA: Diagnosis not present

## 2022-12-13 DIAGNOSIS — G8929 Other chronic pain: Secondary | ICD-10-CM | POA: Diagnosis not present

## 2022-12-13 DIAGNOSIS — M25512 Pain in left shoulder: Secondary | ICD-10-CM | POA: Diagnosis not present

## 2022-12-13 DIAGNOSIS — M6281 Muscle weakness (generalized): Secondary | ICD-10-CM | POA: Diagnosis not present

## 2022-12-13 NOTE — Therapy (Signed)
OUTPATIENT PHYSICAL THERAPY TREATMENT   Patient Name: Stephanie [name] MRN: [id number] DOB:1932/04/13, 89 y.o., female Today's Date: 12/13/2022   END OF SESSION:  PT End of Session - 12/13/22 1358     Visit Number 7    Date for PT Re-Evaluation 01/17/23    Authorization Type Medicare & BCBS    PT Start Time 1358    PT Stop Time 1500    PT Time Calculation (min) 62 min    Activity Tolerance Patient tolerated treatment well    Behavior During Therapy WFL for tasks assessed/performed                Past Medical History:  Diagnosis Date   Allergy    Anemia    past hx    Arthropathy of cervical spine 11/27/2012   Right  Xray at [facility]  On 04/08/2016  AP, lateral, and lateral flexion and extension views of the cervical spine are submitted for evaluation.   No acute fracture identified in the cervical spine.   There is redemonstration of approximately 2 mm of C3 on C4 anterolisthesis in neutral which slightly increases in flexion relative to neutral and extension. Slight C5 on C6 retrolisthesis does not change i   Benign hypertension without congestive heart failure    Benign mole    right hcets mole, bleeds when dried with towel   Bilateral dry eyes    Bladder cancer ([place]) 05-Nov-2016   Cervical spondylosis    Cervicalgia    2 screws in place    Depression with anxiety 2016/11/05   prior to husbands death    Dyspnea    Gross hematuria    developed after first hip replacment, led to indicental finding of bladder tumor, bleeding now resolved    H/O total shoulder replacement, left    History of kidney stones    1970's   Left hand weakness    due to reinjury of flxor tendon s/p tendon surgery march 12-2018   Lumbar stenosis    Malignant tumor of urinary bladder ([place]) 07/2016   Pre-stage I   OA (osteoarthritis)    Parkinson disease dx 2012   neurologist-  dr siddiqui at [facility])     Dr Trudie Reed , [place] [facility]    Rupture of flexor tendon of hand  2020   right   Past Surgical History:  Procedure Laterality Date   BIOPSY MASS LEFT FIRST METACARPAL   06/23/2006   benign   CATARACT EXTRACTION W/ INTRAOCULAR LENS  IMPLANT, BILATERAL  1999 and 2003   CERVICAL FUSION  02/2017   c1-c2 ; reports she has 2 screws 2in long in place done at [facility] ; patient exhibits VERY LIMITED NECK ROM   COLONOSCOPY     CYSTOSCOPY W/ RETROGRADES Bilateral 09/22/2016   Procedure: CYSTOSCOPY WITH RETROGRADE PYELOGRAM;  Surgeon: Alexis Frock, MD;  Location: [facility];  Service: Urology;  Laterality: Bilateral;   D & C HYSTEROSCOPY W/ RESECTION POLYP  07/11/2000   DILATION AND CURETTAGE OF UTERUS  1960's   EXCISION CYST AND DEBRIDEMENT RIGHT WIRST AND REMOVAL FORGEIGN BODY  01/09/2009   FLEXOR TENDON REPAIR Left 12/12/2018   Procedure: REPAIR/TRANSFER FLEXOR DIGITORUM PROFUNDUS OF LEFT SMALL FINGER;  Surgeon: Daryll Brod, MD;  Location: [place];  Service: Orthopedics;  Laterality: Left;   LAPAROSCOPY  yrs ago   infertility    METACARPOPHALANGEAL JOINT ARTHRODESIS Right 05/25/1996   REVERSE SHOULDER ARTHROPLASTY Left  10/14/2022   Procedure: REVERSE SHOULDER ARTHROPLASTY;  Surgeon: Justice Britain, MD;  Location: [facility];  Service: Orthopedics;  Laterality: Left;  139mn   TENDON REPAIR Left 01/15/2019   Hand   TONSILLECTOMY AND ADENOIDECTOMY  child   TOTAL HIP ARTHROPLASTY Left 07/07/2016   Procedure: LEFT TOTAL HIP ARTHROPLASTY ANTERIOR APPROACH;  Surgeon: FGaynelle Arabian MD;  Location: [facility];  Service: Orthopedics;  Laterality: Left;   TOTAL HIP ARTHROPLASTY Right 09/28/2017   Procedure: RIGHT TOTAL HIP ARTHROPLASTY ANTERIOR APPROACH;  Surgeon: AGaynelle Arabian MD;  Location: [facility];  Service: Orthopedics;  Laterality: Right;   TOTAL KNEE ARTHROPLASTY Right 07/30/2019   Procedure: TOTAL KNEE ARTHROPLASTY;  Surgeon: AGaynelle Arabian MD;  Location: [facility];  Service: Orthopedics;  Laterality: Right;  56m   TRANSURETHRAL  RESECTION OF BLADDER TUMOR N/A 09/22/2016   Procedure: TRANSURETHRAL RESECTION OF BLADDER TUMOR (TURBT);  Surgeon: ThAlexis FrockMD;  [facility] Service: Urology;  Laterality: N/A;   Patient Active Problem List   Diagnosis Date Noted   S/P reverse total shoulder arthroplasty, left 10/14/2022   Preoperative cardiovascular examination 10/04/2022   SOB (shortness of breath) 08/02/2022   Insomnia 08/02/2022   Hyponatremia 08/01/2022   Shoulder pain, left 09/14/2021   History of COVID-19 01/06/2021   Diarrhea 09/03/2020   Vitamin D deficiency 09/03/2020   OA (osteoarthritis) of knee 07/30/2019   Tinea corporis 06/08/2019   Psoriatic arthritis ([facility]/18/2020   Anemia 03/13/2018   Hot flashes 03/13/2018   S/P cervical spinal fusion 03/31/2017   Neck pain 12/12/2016   Depression with anxiety 10/17/2016   Bladder cancer ([facility]/01/2016   OA (osteoarthritis) of hip 07/07/2016   Spinal stenosis of lumbar region with radiculopathy 04/08/2016   Spondylolisthesis of lumbar region 04/08/2016   Spondylosis of cervical region without myelopathy or radiculopathy 04/08/2016   Preventative health care 01/18/2016   Low back pain 01/18/2016   History of chicken pox    Hyperlipidemia 12/15/2014   Allergic rhinitis 06/25/2014   Allergy to bee sting 06/10/2014   TMJ tenderness 11/16/2013   Other malaise and fatigue 05/07/2013   Arthropathy of cervical spine ([place]/13/2014   Muscle spasms of neck 11/27/2012   Parkinson's disease 05/24/2012   Conjunctivochalasis 03/09/2012   Epiretinal membrane 03/09/2012   Hyperopia with astigmatism and presbyopia 03/09/2012   Pseudophakia 03/09/2012   HTN (hypertension) 08/31/2011   Right knee pain 08/31/2011   Benign hypertensive heart disease without heart failure 05/12/2011    PCP: BlMosie LukesMD   REFERRING PROVIDER: SuJustice BritainMD   REFERRING DIAG: [id number]-10-CM) - Presence of left artificial shoulder  joint   THERAPY DIAG:  Stiffness of left shoulder, not elsewhere classified  Chronic left shoulder pain  Abnormal posture  Muscle weakness (generalized)  RATIONALE FOR EVALUATION AND TREATMENT: Rehabilitation  ONSET DATE: 10/14/2022 - L reverse TSA  NEXT MD VISIT: ~01/04/23   SUBJECTIVE:  SUBJECTIVE STATEMENT:  Pt reports her neck pain keeps her from sleeping at night and she feels that she can't keep going like this. She does note that she sleeps better following the days she has estim in PT.  PAIN: Are you having pain? Yes: NPRS scale: 0/10 currently, but at night up to 8/10 Pain location: L neck & scapula  Pain description: sharp and dull, constant Aggravating factors: constant Relieving factors: ice, heat, rest  PERTINENT HISTORY:  L shoulder advanced OA - reverse TSA on 10/14/22; R TKA 07/2019, B THR 2018 & 2017, posterior C1-2 cervical fusion 2018, rupture of flexor tendon of L hand with failed surgical repair 2020, Parkinson's, bladder cancer, HTN    PRECAUTIONS: Shoulder  WEIGHT BEARING RESTRICTIONS: No  FALLS:  Has patient fallen in last 6 months? No  LIVING ENVIRONMENT: Lives with: lives alone Lives in: House/apartment Stairs: Yes: External: 4 steps; on right going up, on left going up, and can reach both Has following equipment at home: Single point cane, Walker - 2 wheeled, shower chair, bed side commode, and Grab bars  OCCUPATION: Retired  PLOF: Independent and Leisure: volunteering at [employer], bible study, walking 1/3 mile daily  PATIENT GOALS: "To get rid of my pain and be able to do things with both hands."   OBJECTIVE:   DIAGNOSTIC FINDINGS:  09/14/21 - L shoulder x-ray: IMPRESSION: 1. No acute bony abnormality. 2. Mild/moderate degenerative  changes.  PATIENT SURVEYS:  Quick Dash 65.9 / 100 = 65.9 %  COGNITION: Overall cognitive status: Within functional limits for tasks assessed     SENSATION: WFL  POSTURE: rounded shoulders, forward head, weight shift right, and scoliosis  UPPER EXTREMITY ROM:   Active ROM Right eval Left eval Left 12/13/22  Shoulder flexion 152 89 * 102  Shoulder extension     Shoulder abduction 144 55 * 79  Shoulder adduction     Shoulder internal rotation     Shoulder external rotation 71 45 * 61  Elbow flexion     Elbow extension     Wrist flexion     Wrist extension     Wrist ulnar deviation     Wrist radial deviation     Wrist pronation     Wrist supination      Passive ROM Left eval  Shoulder flexion 91  Shoulder extension   Shoulder abduction 64  Shoulder adduction   Shoulder internal rotation 31 at 30 ABD  Shoulder external rotation   Elbow flexion   Elbow extension   Wrist flexion   Wrist extension   Wrist ulnar deviation   Wrist radial deviation   Wrist pronation   Wrist supination   (Blank rows = not tested) * = pain  UPPER EXTREMITY MMT:  (deferred on eval)  MMT Right eval Left eval  Shoulder flexion    Shoulder extension    Shoulder abduction    Shoulder adduction    Shoulder internal rotation    Shoulder external rotation    Middle trapezius    Lower trapezius    Elbow flexion    Elbow extension    Wrist flexion    Wrist extension    Wrist ulnar deviation    Wrist radial deviation    Wrist pronation    Wrist supination    Grip strength (lbs)    (Blank rows = not tested)  PALPATION:  TTP with taut bands and TPs in L posterolateral shoulder and pecs    TODAY'S TREATMENT:  12/13/22 THERAPEUTIC EXERCISE: to improve flexibility, strength and mobility.  Verbal and tactile cues throughout for technique.  Pulleys: L shoulder flexion & scaption x 3 min each Sidelying L shoulder ER + scap retraction 2 x 10 Sidelying upper-cute shoulder flexion  + scap retraction x 10 Seated RTB scap retraction + rows to neutral 10 x 3" - cues to avoid excessive shoulder extension to avoid rounding shoulders foward  MANUAL THERAPY: To promote normalized muscle tension, improved flexibility, improved joint mobility, increased ROM, and reduced pain. STM/TPR to L UT, LS, rhomboids, subscapularis and teres group L scapular mobs - all directions with emphasis on scap retraction and depression  SELF CARE:  Instructed pt in use of Theracane for self-STM/TPR at home - pt reports her dtr has one that she might be able to borrow  MODALITIES: IFC to L UT & posterior shoulder complex (rhomboids and teres group), 80-150 Hz, intensity to patient tolerance x 15 min  Moist heat to L upper shoulder/L lateral neck and scapula x 15 min    12/09/22 THERAPEUTIC EXERCISE: to improve flexibility, strength and mobility.  Verbal and tactile cues throughout for technique.  Pulleys: L shoulder flexion & scaption x 3 min each Seated Scap Retraction AROM x10, YTB x10 Standing Isometric ER, Abd, Flx, Ext x10 for 5" hold Attempted seated wand flexion AAROM but deferred due to increased discomfort Seated L Uppercut 2x10  Seated L Hitchhiker 2x10  AAROM ER with PVC x10 (to 45)   Manual Therapy: STM and TPR to L UT and LS  MODALITIES: IFC to L UT & Infraspinatus , 80-150 Hz, intensity to patient tolerance x 15 min  Moist heat to L lateral neck x 15 min    12/06/22 THERAPEUTIC EXERCISE: to improve flexibility, strength and mobility.  Verbal and tactile cues throughout for technique.  Pulleys: L shoulder flexion & scaption x 3 min each Seated Scap Retraction AROM x10, YTB x10 Supine L uppercut 2x10  Supine L hitchhiker 2x10 Standing Isometric ER, Abd x10 for 5" hold   Manual Therapy: STM and TPR to L UT and LS, Supraspinatus, Infraspinatus  MODALITIES: IFC to L UT & Infraspinatus , 80-150 Hz, intensity to patient tolerance x 15 min  Moist heat to L upper shoulder/L  lateral neck x 15 min    PATIENT EDUCATION:  Education details: ongoing PT POC  Person educated: Patient Education method: Explanation Education comprehension: verbalized understanding   HOME EXERCISE PROGRAM: Access Code: [email or web address] URL: [email or web address] Date: 11/29/2022 Prepared by: Annie Paras  Exercises - Supine Shoulder Flexion AAROM with Dowel  - 2 x daily - 7 x weekly - 2 sets - 10 reps - 3 sec hold - Seated Shoulder Abduction AAROM with Dowel  - 2 x daily - 7 x weekly - 2 sets - 10 reps - 3 sec hold - Seated Shoulder External Rotation AAROM with Cane and Hand in Neutral (Mirrored)  - 2 x daily - 7 x weekly - 2 sets - 10 reps - 3 sec hold - Isometric Shoulder External Rotation  - 1 x daily - 7 x weekly - 2 sets - 10 reps - 5 sec hold - Isometric Shoulder Abduction  - 1 x daily - 7 x weekly - 2 sets - 10 reps - 3 sec hold   ASSESSMENT:  CLINICAL IMPRESSION:  Stephanie [name] reports persistent L-sided neck pain and periscapular pain continues to prevent her from getting a restful night's sleep. She notes some brief temporary relief from cervical retraction but  does note feel like she can go around all day tucking her chin - reinforced the goal of this exercise is to help her maintain a more neutral spinal posture, avoiding the fwd head and rounded shoulders which leave her muscles in position of constant stretch. Attempted to progress TE per protocol with introduction of sidelying L shoulder ER and flexion, but limited by periscapular and upper arm pain. Multiple TP/taut bands identified which were addressed with STM and manual TPR, resulting in better tolerance for the exercises. Pt instructed in use of Theracane for self-STM to posterior shoulder at home. Marcille is demonstrating improving L shoulder AROM but still limited by pain, predominantly in neck and periscapular muscles, but also L upper arm. Session concluded with estim and moist heat to promote further muscle  relaxation for hopefully less sleep disturbance due to pain as pt has noted this benefit following prior applications.   OBJECTIVE IMPAIRMENTS: decreased activity tolerance, decreased mobility, decreased ROM, decreased strength, increased fascial restrictions, impaired perceived functional ability, increased muscle spasms, impaired flexibility, impaired UE functional use, improper body mechanics, postural dysfunction, and pain.   ACTIVITY LIMITATIONS: carrying, lifting, bed mobility, bathing, dressing, reach over head, and hygiene/grooming  PARTICIPATION LIMITATIONS: meal prep, cleaning, laundry, driving, shopping, community activity, and church  PERSONAL FACTORS: Age, Fitness, Past/current experiences, Time since onset of injury/illness/exacerbation, and 3+ comorbidities: R TKA 07/2019, B THR 2018 & 2017, posterior C1-2 cervical fusion 2018, rupture of flexor tendon of L hand with failed surgical repair 2020, Parkinson's, bladder cancer, HTN  are also affecting patient's functional outcome.   REHAB POTENTIAL: Good  CLINICAL DECISION MAKING: Evolving/moderate complexity  EVALUATION COMPLEXITY: Low   GOALS: Goals reviewed with patient? Yes  SHORT TERM GOALS: Target date: 12/20/2022  Patient will be independent with initial HEP to improve outcomes and carryover.  Baseline:  Goal status: MET - 12/13/22  2.  Patient will improve L shoulder P/AAROM to >/= 115 flexion, 90 abduction and 45 ER w/o increased pain to promote more functional L UE use.  Baseline:  Goal status: IN PROGRESS  LONG TERM GOALS: Target date: 01/17/2023  Patient will be independent with ongoing/advanced HEP for self-management at home.  Baseline:  Goal status: IN PROGRESS  2.  Patient will report 50-75% improvement in L shoulder pain to improve QOL.  Baseline:  Goal status: IN PROGRESS  3.  Patient to demonstrate improved upright posture with posterior shoulder girdle engaged to promote improved glenohumeral joint  mobility. Baseline:  Goal status: IN PROGRESS  4.  Patient to improve L shoulder AROM to [facility] without pain provocation to allow for increased ease of ADLs.  Baseline:  Goal status: IN PROGRESS  5.  Patient will demonstrate improved L shoulder strength to >/= 4/5 for functional UE use. Baseline:  Goal status: IN PROGRESS  6  Patient will report </= 55% on QuickDASH to demonstrate improved functional ability.  Baseline: 65.9 / 100 = 65.9 % Goal status: IN PROGRESS   PLAN:  PT FREQUENCY: 2x/week  PT DURATION: 8 weeks  PLANNED INTERVENTIONS: Therapeutic exercises, Therapeutic activity, Neuromuscular re-education, Balance training, Gait training, Patient/Family education, Self Care, Joint mobilization, Dry Needling, Electrical stimulation, Cryotherapy, Moist heat, scar mobilization, Taping, Vasopneumatic device, Ionotophoresis '4mg'$ /ml Dexamethasone, Manual therapy, and Re-evaluation  PLAN FOR NEXT SESSION: Assess P/AAROM measurements for STG; keep performing L shoulder P/AA/AROM per protocol; L shoulder ABD,ER,FLX,EXT, ADD isometrics, MT and modalities as needed/tolerated to address pain and abnormal muscle tension   Percival Spanish, PT 12/13/2022, 4:30  PM

## 2022-12-15 ENCOUNTER — Ambulatory Visit: Payer: Medicare Other | Admitting: Physical Therapy

## 2022-12-15 DIAGNOSIS — L603 Nail dystrophy: Secondary | ICD-10-CM | POA: Diagnosis not present

## 2022-12-15 DIAGNOSIS — I739 Peripheral vascular disease, unspecified: Secondary | ICD-10-CM | POA: Diagnosis not present

## 2022-12-15 DIAGNOSIS — L84 Corns and callosities: Secondary | ICD-10-CM | POA: Diagnosis not present

## 2022-12-16 ENCOUNTER — Encounter: Payer: Self-pay | Admitting: Physical Therapy

## 2022-12-16 ENCOUNTER — Ambulatory Visit: Payer: Medicare Other | Attending: Neurology | Admitting: Physical Therapy

## 2022-12-16 ENCOUNTER — Encounter: Payer: Medicare Other | Admitting: Physical Therapy

## 2022-12-16 DIAGNOSIS — M25512 Pain in left shoulder: Secondary | ICD-10-CM | POA: Insufficient documentation

## 2022-12-16 DIAGNOSIS — R2689 Other abnormalities of gait and mobility: Secondary | ICD-10-CM | POA: Insufficient documentation

## 2022-12-16 DIAGNOSIS — R2681 Unsteadiness on feet: Secondary | ICD-10-CM | POA: Diagnosis not present

## 2022-12-16 DIAGNOSIS — G8929 Other chronic pain: Secondary | ICD-10-CM | POA: Insufficient documentation

## 2022-12-16 DIAGNOSIS — R293 Abnormal posture: Secondary | ICD-10-CM | POA: Diagnosis not present

## 2022-12-16 DIAGNOSIS — M6281 Muscle weakness (generalized): Secondary | ICD-10-CM | POA: Diagnosis not present

## 2022-12-16 DIAGNOSIS — M25612 Stiffness of left shoulder, not elsewhere classified: Secondary | ICD-10-CM | POA: Insufficient documentation

## 2022-12-16 NOTE — Therapy (Addendum)
OUTPATIENT PHYSICAL THERAPY TREATMENT   Patient Name: Stephanie [name] MRN: [id number] DOB:05-10-1932, 89 y.o., female Today's Date: 12/16/2022   END OF SESSION:  PT End of Session - 12/16/22 1305     Visit Number 8    Date for PT Re-Evaluation 01/17/23    Authorization Type Medicare & BCBS    PT Start Time 1308    PT Stop Time 1413    PT Time Calculation (min) 65 min    Activity Tolerance Patient tolerated treatment well    Behavior During Therapy WFL for tasks assessed/performed                 Past Medical History:  Diagnosis Date   Allergy    Anemia    past hx    Arthropathy of cervical spine 11/27/2012   Right  Xray at [facility]  On 04/08/2016  AP, lateral, and lateral flexion and extension views of the cervical spine are submitted for evaluation.   No acute fracture identified in the cervical spine.   There is redemonstration of approximately 2 mm of C3 on C4 anterolisthesis in neutral which slightly increases in flexion relative to neutral and extension. Slight C5 on C6 retrolisthesis does not change i   Benign hypertension without congestive heart failure    Benign mole    right hcets mole, bleeds when dried with towel   Bilateral dry eyes    Bladder cancer ([place]) Oct 18, 2016   Cervical spondylosis    Cervicalgia    2 screws in place    Depression with anxiety 10/18/16   prior to husbands death    Dyspnea    Gross hematuria    developed after first hip replacment, led to indicental finding of bladder tumor, bleeding now resolved    H/O total shoulder replacement, left    History of kidney stones    1970's   Left hand weakness    due to reinjury of flxor tendon s/p tendon surgery march 12-2018   Lumbar stenosis    Malignant tumor of urinary bladder ([place]) 07/2016   Pre-stage I   OA (osteoarthritis)    Parkinson disease dx 2012   neurologist-  dr siddiqui at [place] [facility])     Dr Trudie Reed , [place] [facility]    Rupture of flexor tendon of hand  2020   right   Past Surgical History:  Procedure Laterality Date   BIOPSY MASS LEFT FIRST METACARPAL   06/23/2006   benign   CATARACT EXTRACTION W/ INTRAOCULAR LENS  IMPLANT, BILATERAL  1999 and 2003   CERVICAL FUSION  02/2017   c1-c2 ; reports she has 2 screws 2in long in place done at [facility] ; patient exhibits VERY LIMITED NECK ROM   COLONOSCOPY     CYSTOSCOPY W/ RETROGRADES Bilateral 09/22/2016   Procedure: CYSTOSCOPY WITH RETROGRADE PYELOGRAM;  Surgeon: Alexis Frock, MD;  Location: [facility];  Service: Urology;  Laterality: Bilateral;   D & C HYSTEROSCOPY W/ RESECTION POLYP  07/11/2000   DILATION AND CURETTAGE OF UTERUS  1960's   EXCISION CYST AND DEBRIDEMENT RIGHT WIRST AND REMOVAL FORGEIGN BODY  01/09/2009   FLEXOR TENDON REPAIR Left 12/12/2018   Procedure: REPAIR/TRANSFER FLEXOR DIGITORUM PROFUNDUS OF LEFT SMALL FINGER;  Surgeon: Daryll Brod, MD;  Location: [place];  Service: Orthopedics;  Laterality: Left;   LAPAROSCOPY  yrs ago   infertility    METACARPOPHALANGEAL JOINT ARTHRODESIS Right 05/25/1996   REVERSE SHOULDER ARTHROPLASTY  Left 10/14/2022   Procedure: REVERSE SHOULDER ARTHROPLASTY;  Surgeon: Justice Britain, MD;  Location: [facility];  Service: Orthopedics;  Laterality: Left;  161mn   TENDON REPAIR Left 01/15/2019   Hand   TONSILLECTOMY AND ADENOIDECTOMY  child   TOTAL HIP ARTHROPLASTY Left 07/07/2016   Procedure: LEFT TOTAL HIP ARTHROPLASTY ANTERIOR APPROACH;  Surgeon: FGaynelle Arabian MD;  Location: [facility];  Service: Orthopedics;  Laterality: Left;   TOTAL HIP ARTHROPLASTY Right 09/28/2017   Procedure: RIGHT TOTAL HIP ARTHROPLASTY ANTERIOR APPROACH;  Surgeon: AGaynelle Arabian MD;  Location: [facility];  Service: Orthopedics;  Laterality: Right;   TOTAL KNEE ARTHROPLASTY Right 07/30/2019   Procedure: TOTAL KNEE ARTHROPLASTY;  Surgeon: AGaynelle Arabian MD;  Location: [facility];  Service: Orthopedics;  Laterality: Right;  521m   TRANSURETHRAL  RESECTION OF BLADDER TUMOR N/A 09/22/2016   Procedure: TRANSURETHRAL RESECTION OF BLADDER TUMOR (TURBT);  Surgeon: ThAlexis FrockMD;  [facility] Service: Urology;  Laterality: N/A;   Patient Active Problem List   Diagnosis Date Noted   S/P reverse total shoulder arthroplasty, left 10/14/2022   Preoperative cardiovascular examination 10/04/2022   SOB (shortness of breath) 08/02/2022   Insomnia 08/02/2022   Hyponatremia 08/01/2022   Shoulder pain, left 09/14/2021   History of COVID-19 01/06/2021   Diarrhea 09/03/2020   Vitamin D deficiency 09/03/2020   OA (osteoarthritis) of knee 07/30/2019   Tinea corporis 06/08/2019   Psoriatic arthritis ([place]/18/2020   Anemia 03/13/2018   Hot flashes 03/13/2018   S/P cervical spinal fusion 03/31/2017   Neck pain 12/12/2016   Depression with anxiety 10/17/2016   Bladder cancer ([place]/01/2016   OA (osteoarthritis) of hip 07/07/2016   Spinal stenosis of lumbar region with radiculopathy 04/08/2016   Spondylolisthesis of lumbar region 04/08/2016   Spondylosis of cervical region without myelopathy or radiculopathy 04/08/2016   Preventative health care 01/18/2016   Low back pain 01/18/2016   History of chicken pox    Hyperlipidemia 12/15/2014   Allergic rhinitis 06/25/2014   Allergy to bee sting 06/10/2014   TMJ tenderness 11/16/2013   Other malaise and fatigue 05/07/2013   Arthropathy of cervical spine ([place]/13/2014   Muscle spasms of neck 11/27/2012   Parkinson's disease 05/24/2012   Conjunctivochalasis 03/09/2012   Epiretinal membrane 03/09/2012   Hyperopia with astigmatism and presbyopia 03/09/2012   Pseudophakia 03/09/2012   HTN (hypertension) 08/31/2011   Right knee pain 08/31/2011   Benign hypertensive heart disease without heart failure 05/12/2011    PCP: BlMosie LukesMD   REFERRING PROVIDER: SuJustice BritainMD   REFERRING DIAG: [phone]-10-CM) - Presence of left artificial shoulder  joint   THERAPY DIAG:  Stiffness of left shoulder, not elsewhere classified  Chronic left shoulder pain  Abnormal posture  Muscle weakness (generalized)  RATIONALE FOR EVALUATION AND TREATMENT: Rehabilitation  ONSET DATE: 10/14/2022 - L reverse TSA  NEXT MD VISIT: ~01/04/23   SUBJECTIVE:  SUBJECTIVE STATEMENT:  Stated that she had no pain leaving Tuesday's session, doesn't know if it was the MT or the e-stim or the combo of the two but she was very pleased from last session. Pain continues to wake her up at night, today is experiencing pain in the back of her  L arm. Pt is using tennis ball for TP release.   PAIN: Are you having pain? Yes: NPRS scale: 0/10 currently, but at night up to 8/10 Pain location: L neck & scapula, back of L arm  Pain description: sharp and dull, constant Aggravating factors: constant Relieving factors: ice, heat, rest  PERTINENT HISTORY:  L shoulder advanced OA - reverse TSA on 10/14/22; R TKA 07/2019, B THR 2018 & 2017, posterior C1-2 cervical fusion 2018, rupture of flexor tendon of L hand with failed surgical repair 2020, Parkinson's, bladder cancer, HTN    PRECAUTIONS: Shoulder  WEIGHT BEARING RESTRICTIONS: No  FALLS:  Has patient fallen in last 6 months? No  LIVING ENVIRONMENT: Lives with: lives alone Lives in: House/apartment Stairs: Yes: External: 4 steps; on right going up, on left going up, and can reach both Has following equipment at home: Single point cane, Walker - 2 wheeled, shower chair, bed side commode, and Grab bars  OCCUPATION: Retired  PLOF: Independent and Leisure: volunteering at [employer], bible study, walking 1/3 mile daily  PATIENT GOALS: "To get rid of my pain and be able to do things with both hands."   OBJECTIVE:    DIAGNOSTIC FINDINGS:  09/14/21 - L shoulder x-ray: IMPRESSION: 1. No acute bony abnormality. 2. Mild/moderate degenerative changes.  PATIENT SURVEYS:  Quick Dash 65.9 / 100 = 65.9 %  COGNITION: Overall cognitive status: Within functional limits for tasks assessed     SENSATION: WFL  POSTURE: rounded shoulders, forward head, weight shift right, and scoliosis  UPPER EXTREMITY ROM:   Active ROM Right eval Left eval Left 12/13/22  Shoulder flexion 152 89 * 102  Shoulder extension     Shoulder abduction 144 55 * 79  Shoulder adduction     Shoulder internal rotation     Shoulder external rotation 71 45 * 61  Elbow flexion     Elbow extension     Wrist flexion     Wrist extension     Wrist ulnar deviation     Wrist radial deviation     Wrist pronation     Wrist supination      Passive ROM Left eval  Shoulder flexion 91  Shoulder extension   Shoulder abduction 64  Shoulder adduction   Shoulder internal rotation 31 at 30 ABD  Shoulder external rotation   Elbow flexion   Elbow extension   Wrist flexion   Wrist extension   Wrist ulnar deviation   Wrist radial deviation   Wrist pronation   Wrist supination   (Blank rows = not tested) * = pain  UPPER EXTREMITY MMT:  (deferred on eval)  MMT Right eval Left eval  Shoulder flexion    Shoulder extension    Shoulder abduction    Shoulder adduction    Shoulder internal rotation    Shoulder external rotation    Middle trapezius    Lower trapezius    Elbow flexion    Elbow extension    Wrist flexion    Wrist extension    Wrist ulnar deviation    Wrist radial deviation    Wrist pronation    Wrist supination    Grip strength (  lbs)    (Blank rows = not tested)  PALPATION:  TTP with taut bands and TPs in L posterolateral shoulder and pecs    TODAY'S TREATMENT:    12/16/22 THERAPEUTIC EXERCISE: to improve flexibility, strength and mobility.  Verbal and tactile cues throughout for technique.  Pulleys:  L shoulder flexion & scaption x 3 min each Chin tucks 5" hold 2x10  UT Stretch x5 10" hold  Sidelying L shoulder ER + scap retraction 2 x 10 Sidelying upper-cut shoulder flexion + scap retraction 2 x 10 Standing RTB scap retraction + rows to neutral 10 x 3" - cues to avoid excessive shoulder extension to avoid rounding shoulders forward Standing noodle bt shoulder blade: wand AAROM Flx, Abd, ER (caused some pain so only able to get 5 repetitions) w/ yardstick x10   MANUAL THERAPY: To promote normalized muscle tension, improved flexibility, improved joint mobility, increased ROM, and reduced pain. STM/TPR to L UT, LS, rhomboids, subscapularis and teres group L scapular mobs - all directions with emphasis on scap retraction and depression  MODALITIES: IFC to L UT & Infraspinatus , 80-150 Hz, intensity to patient tolerance x 15 min  Moist heat to L lateral neck x 15 min   12/13/22 THERAPEUTIC EXERCISE: to improve flexibility, strength and mobility.  Verbal and tactile cues throughout for technique.  Pulleys: L shoulder flexion & scaption x 3 min each Sidelying L shoulder ER + scap retraction 2 x 10 Sidelying upper-cut shoulder flexion + scap retraction x 10 Seated RTB scap retraction + rows to neutral 10 x 3" - cues to avoid excessive shoulder extension to avoid rounding shoulders foward  MANUAL THERAPY: To promote normalized muscle tension, improved flexibility, improved joint mobility, increased ROM, and reduced pain. STM/TPR to L UT, LS, rhomboids, subscapularis and teres group L scapular mobs - all directions with emphasis on scap retraction and depression  SELF CARE:  Instructed pt in use of Theracane for self-STM/TPR at home - pt reports her dtr has one that she might be able to borrow  MODALITIES: IFC to L UT & posterior shoulder complex (rhomboids and teres group), 80-150 Hz, intensity to patient tolerance x 15 min  Moist heat to L upper shoulder/L lateral neck and scapula x 15  min    12/09/22 THERAPEUTIC EXERCISE: to improve flexibility, strength and mobility.  Verbal and tactile cues throughout for technique.  Pulleys: L shoulder flexion & scaption x 3 min each Seated Scap Retraction AROM x10, YTB x10 Standing Isometric ER, Abd, Flx, Ext x10 for 5" hold Attempted seated wand flexion AAROM but deferred due to increased discomfort Seated L Uppercut 2x10  Seated L Hitchhiker 2x10  AAROM ER with PVC x10 (to 45)   Manual Therapy: STM and TPR to L UT and LS  MODALITIES: IFC to L UT & Infraspinatus , 80-150 Hz, intensity to patient tolerance x 15 min  Moist heat to L lateral neck x 15 min    PATIENT EDUCATION:  Education details: ongoing PT POC  Person educated: Patient Education method: Explanation Education comprehension: verbalized understanding   HOME EXERCISE PROGRAM: Access Code: [email or web address] URL: [email or web address] Date: 11/29/2022 Prepared by: Annie Paras  Exercises - Supine Shoulder Flexion AAROM with Dowel  - 2 x daily - 7 x weekly - 2 sets - 10 reps - 3 sec hold - Seated Shoulder Abduction AAROM with Dowel  - 2 x daily - 7 x weekly - 2 sets - 10 reps - 3 sec hold - Seated Shoulder External  Rotation AAROM with Cane and Hand in Neutral (Mirrored)  - 2 x daily - 7 x weekly - 2 sets - 10 reps - 3 sec hold - Isometric Shoulder External Rotation  - 1 x daily - 7 x weekly - 2 sets - 10 reps - 5 sec hold - Isometric Shoulder Abduction  - 1 x daily - 7 x weekly - 2 sets - 10 reps - 3 sec hold   ASSESSMENT:  CLINICAL IMPRESSION:  Pt came into today's session feeling pain in her neck and in the back of her L arm. Pt stated she felt no pain leaving her last session so today's session involved incorporating similar MT intervention in the hope of recreating the same effect.Pt continued to exhibit multiple TP/taut bands which were addressed with STM, but when compared to last session they seemed to be less intense.Pt continues to experience  pain with any ER motion, therefore progression of the TE per protocol was not successful today. Pt is demonstrating improving L shoulder AROM but still limited by pain, predominantly in neck and periscapular muscles, but also L upper arm. Session concluded with estim and moist heat to promote further muscle relaxation to hopefully reduce pain she experiences throughout the night.   OBJECTIVE IMPAIRMENTS: decreased activity tolerance, decreased mobility, decreased ROM, decreased strength, increased fascial restrictions, impaired perceived functional ability, increased muscle spasms, impaired flexibility, impaired UE functional use, improper body mechanics, postural dysfunction, and pain.   ACTIVITY LIMITATIONS: carrying, lifting, bed mobility, bathing, dressing, reach over head, and hygiene/grooming  PARTICIPATION LIMITATIONS: meal prep, cleaning, laundry, driving, shopping, community activity, and church  PERSONAL FACTORS: Age, Fitness, Past/current experiences, Time since onset of injury/illness/exacerbation, and 3+ comorbidities: R TKA 07/2019, B THR 2018 & 2017, posterior C1-2 cervical fusion 2018, rupture of flexor tendon of L hand with failed surgical repair 2020, Parkinson's, bladder cancer, HTN  are also affecting patient's functional outcome.   REHAB POTENTIAL: Good  CLINICAL DECISION MAKING: Evolving/moderate complexity  EVALUATION COMPLEXITY: Low   GOALS: Goals reviewed with patient? Yes  SHORT TERM GOALS: Target date: 12/20/2022  Patient will be independent with initial HEP to improve outcomes and carryover.  Baseline:  Goal status: MET  12/13/22  2.  Patient will improve L shoulder P/AAROM to >/= 115 flexion, 90 abduction and 45 ER w/o increased pain to promote more functional L UE use.  Baseline:  Goal status: IN PROGRESS  12/13/22 - refer to above ROM table - partially met for ER  LONG TERM GOALS: Target date: 01/17/2023  Patient will be independent with ongoing/advanced HEP  for self-management at home.  Baseline:  Goal status: IN PROGRESS  2.  Patient will report 50-75% improvement in L shoulder pain to improve QOL.  Baseline:  Goal status: IN PROGRESS  3.  Patient to demonstrate improved upright posture with posterior shoulder girdle engaged to promote improved glenohumeral joint mobility. Baseline:  Goal status: IN PROGRESS  4.  Patient to improve L shoulder AROM to [facility] without pain provocation to allow for increased ease of ADLs.  Baseline:  Goal status: IN PROGRESS  5.  Patient will demonstrate improved L shoulder strength to >/= 4/5 for functional UE use. Baseline:  Goal status: IN PROGRESS  6  Patient will report </= 55% on QuickDASH to demonstrate improved functional ability.  Baseline: 65.9 / 100 = 65.9 % Goal status: IN PROGRESS   PLAN:  PT FREQUENCY: 2x/week  PT DURATION: 8 weeks  PLANNED INTERVENTIONS: Therapeutic exercises, Therapeutic activity, Neuromuscular  re-education, Balance training, Gait training, Patient/Family education, Self Care, Joint mobilization, Dry Needling, Electrical stimulation, Cryotherapy, Moist heat, scar mobilization, Taping, Vasopneumatic device, Ionotophoresis '4mg'$ /ml Dexamethasone, Manual therapy, and Re-evaluation  PLAN FOR NEXT SESSION: keep performing L shoulder P/AA/AROM per protocol; L shoulder ABD,ER,FLX,EXT, ADD isometrics, MT and modalities as needed/tolerated to address pain and abnormal muscle tension, continue to re-assess goals    Alisia Vanengen Romero-Perozo, Student-PT 12/16/2022, 2:00 PM

## 2022-12-20 ENCOUNTER — Ambulatory Visit: Payer: Medicare Other | Admitting: Physical Therapy

## 2022-12-20 ENCOUNTER — Encounter: Payer: Self-pay | Admitting: Physical Therapy

## 2022-12-20 DIAGNOSIS — R2689 Other abnormalities of gait and mobility: Secondary | ICD-10-CM | POA: Diagnosis not present

## 2022-12-20 DIAGNOSIS — G8929 Other chronic pain: Secondary | ICD-10-CM

## 2022-12-20 DIAGNOSIS — M6281 Muscle weakness (generalized): Secondary | ICD-10-CM | POA: Diagnosis not present

## 2022-12-20 DIAGNOSIS — M25512 Pain in left shoulder: Secondary | ICD-10-CM | POA: Diagnosis not present

## 2022-12-20 DIAGNOSIS — M25612 Stiffness of left shoulder, not elsewhere classified: Secondary | ICD-10-CM | POA: Diagnosis not present

## 2022-12-20 DIAGNOSIS — R293 Abnormal posture: Secondary | ICD-10-CM | POA: Diagnosis not present

## 2022-12-20 NOTE — Therapy (Signed)
OUTPATIENT PHYSICAL THERAPY TREATMENT   Patient Name: Stephanie [name] MRN: [id number] DOB:1932-05-09, 89 y.o., female Today's Date: 12/20/2022   END OF SESSION:  PT End of Session - 12/20/22 1359     Visit Number 9    Date for PT Re-Evaluation 01/17/23    Authorization Type Medicare & BCBS    PT Start Time 1401    PT Stop Time 1508    PT Time Calculation (min) 67 min    Activity Tolerance Patient tolerated treatment well    Behavior During Therapy WFL for tasks assessed/performed                  Past Medical History:  Diagnosis Date   Allergy    Anemia    past hx    Arthropathy of cervical spine 11/27/2012   Right  Xray at [facility]  On 04/08/2016  AP, lateral, and lateral flexion and extension views of the cervical spine are submitted for evaluation.   No acute fracture identified in the cervical spine.   There is redemonstration of approximately 2 mm of C3 on C4 anterolisthesis in neutral which slightly increases in flexion relative to neutral and extension. Slight C5 on C6 retrolisthesis does not change i   Benign hypertension without congestive heart failure    Benign mole    right hcets mole, bleeds when dried with towel   Bilateral dry eyes    Bladder cancer ([place]) Oct 19, 2016   Cervical spondylosis    Cervicalgia    2 screws in place    Depression with anxiety 2016/10/19   prior to husbands death    Dyspnea    Gross hematuria    developed after first hip replacment, led to indicental finding of bladder tumor, bleeding now resolved    H/O total shoulder replacement, left    History of kidney stones    1970's   Left hand weakness    due to reinjury of flxor tendon s/p tendon surgery march 12-2018   Lumbar stenosis    Malignant tumor of urinary bladder ([place]) 07/2016   Pre-stage I   OA (osteoarthritis)    Parkinson disease dx 2012   neurologist-  dr siddiqui at [facility]     Dr Trudie Reed , [place] [facility]    Rupture of flexor tendon of  hand 2020   right   Past Surgical History:  Procedure Laterality Date   BIOPSY MASS LEFT FIRST METACARPAL   06/23/2006   benign   CATARACT EXTRACTION W/ INTRAOCULAR LENS  IMPLANT, BILATERAL  1999 and 2003   CERVICAL FUSION  02/2017   c1-c2 ; reports she has 2 screws 2in long in place done at [facility] ; patient exhibits VERY LIMITED NECK ROM   COLONOSCOPY     CYSTOSCOPY W/ RETROGRADES Bilateral 09/22/2016   Procedure: CYSTOSCOPY WITH RETROGRADE PYELOGRAM;  Surgeon: Alexis Frock, MD;  Location: [facility];  Service: Urology;  Laterality: Bilateral;   D & C HYSTEROSCOPY W/ RESECTION POLYP  07/11/2000   DILATION AND CURETTAGE OF UTERUS  1960's   EXCISION CYST AND DEBRIDEMENT RIGHT WIRST AND REMOVAL FORGEIGN BODY  01/09/2009   FLEXOR TENDON REPAIR Left 12/12/2018   Procedure: REPAIR/TRANSFER FLEXOR DIGITORUM PROFUNDUS OF LEFT SMALL FINGER;  Surgeon: Daryll Brod, MD;  Location: [place];  Service: Orthopedics;  Laterality: Left;   LAPAROSCOPY  yrs ago   infertility    METACARPOPHALANGEAL JOINT ARTHRODESIS Right 05/25/1996   REVERSE SHOULDER  ARTHROPLASTY Left 10/14/2022   Procedure: REVERSE SHOULDER ARTHROPLASTY;  Surgeon: Justice Britain, MD;  Location: [facility];  Service: Orthopedics;  Laterality: Left;  174mn   TENDON REPAIR Left 01/15/2019   Hand   TONSILLECTOMY AND ADENOIDECTOMY  child   TOTAL HIP ARTHROPLASTY Left 07/07/2016   Procedure: LEFT TOTAL HIP ARTHROPLASTY ANTERIOR APPROACH;  Surgeon: FGaynelle Arabian MD;  Location: [facility];  Service: Orthopedics;  Laterality: Left;   TOTAL HIP ARTHROPLASTY Right 09/28/2017   Procedure: RIGHT TOTAL HIP ARTHROPLASTY ANTERIOR APPROACH;  Surgeon: AGaynelle Arabian MD;  Location: [facility];  Service: Orthopedics;  Laterality: Right;   TOTAL KNEE ARTHROPLASTY Right 07/30/2019   Procedure: TOTAL KNEE ARTHROPLASTY;  Surgeon: AGaynelle Arabian MD;  Location: [facility];  Service: Orthopedics;  Laterality: Right;  537m    TRANSURETHRAL RESECTION OF BLADDER TUMOR N/A 09/22/2016   Procedure: TRANSURETHRAL RESECTION OF BLADDER TUMOR (TURBT);  Surgeon: ThAlexis FrockMD;  [facility] Service: [facility];  Laterality: N/A;   Patient Active Problem List   Diagnosis Date Noted   S/P reverse total shoulder arthroplasty, left 10/14/2022   Preoperative cardiovascular examination 10/04/2022   SOB (shortness of breath) 08/02/2022   Insomnia 08/02/2022   Hyponatremia 08/01/2022   Shoulder pain, left 09/14/2021   History of COVID-19 01/06/2021   Diarrhea 09/03/2020   Vitamin D deficiency 09/03/2020   OA (osteoarthritis) of knee 07/30/2019   Tinea corporis 06/08/2019   Psoriatic arthritis ([place]/18/2020   Anemia 03/13/2018   Hot flashes 03/13/2018   S/P cervical spinal fusion 03/31/2017   Neck pain 12/12/2016   Depression with anxiety 10/17/2016   Bladder cancer [place]   OA (osteoarthritis) of hip 07/07/2016   Spinal stenosis of lumbar region with radiculopathy 04/08/2016   Spondylolisthesis of lumbar region 04/08/2016   Spondylosis of cervical region without myelopathy or radiculopathy 04/08/2016   Preventative health care 01/18/2016   Low back pain 01/18/2016   History of chicken pox    Hyperlipidemia 12/15/2014   Allergic rhinitis 06/25/2014   Allergy to bee sting 06/10/2014   TMJ tenderness 11/16/2013   Other malaise and fatigue 05/07/2013   Arthropathy of cervical spine ([place]/13/2014   Muscle spasms of neck 11/27/2012   Parkinson's disease 05/24/2012   Conjunctivochalasis 03/09/2012   Epiretinal membrane 03/09/2012   Hyperopia with astigmatism and presbyopia 03/09/2012   Pseudophakia 03/09/2012   HTN (hypertension) 08/31/2011   Right knee pain 08/31/2011   Benign hypertensive heart disease without heart failure 05/12/2011    PCP: BlMosie LukesMD   REFERRING PROVIDER: SuJustice BritainMD   REFERRING DIAG: [id number]-10-CM) - Presence of left artificial  shoulder joint   THERAPY DIAG:  Stiffness of left shoulder, not elsewhere classified  Chronic left shoulder pain  Abnormal posture  Muscle weakness (generalized)  RATIONALE FOR EVALUATION AND TREATMENT: Rehabilitation  ONSET DATE: 10/14/2022 - L reverse TSA  NEXT MD VISIT: 01/05/23   SUBJECTIVE:  SUBJECTIVE STATEMENT:  Pt reports increased pain the night of her last PT visit and when she attempted the HEP the next day, but feels that the pain has been less since.  PAIN: Are you having pain? Yes: NPRS scale: 0/10 currently, but at night up to 5/10 Pain location: L neck & scapula, back of L arm  Pain description: sharp and dull, constant Aggravating factors: constant Relieving factors: ice, heat, rest  PERTINENT HISTORY:  L shoulder advanced OA - reverse TSA on 10/14/22; R TKA 07/2019, B THR 2018 & 2017, posterior C1-2 cervical fusion 2018, rupture of flexor tendon of L hand with failed surgical repair 2020, Parkinson's, bladder cancer, HTN    PRECAUTIONS: Shoulder  WEIGHT BEARING RESTRICTIONS: No  FALLS:  Has patient fallen in last 6 months? No  LIVING ENVIRONMENT: Lives with: lives alone Lives in: House/apartment Stairs: Yes: External: 4 steps; on right going up, on left going up, and can reach both Has following equipment at home: Single point cane, Walker - 2 wheeled, shower chair, bed side commode, and Grab bars  OCCUPATION: Retired  PLOF: Independent and Leisure: volunteering at [employer], bible study, walking 1/3 mile daily  PATIENT GOALS: "To get rid of my pain and be able to do things with both hands."   OBJECTIVE:   DIAGNOSTIC FINDINGS:  09/14/21 - L shoulder x-ray: IMPRESSION: 1. No acute bony abnormality. 2. Mild/moderate degenerative changes.  PATIENT  SURVEYS:  Quick Dash 65.9 / 100 = 65.9 %  COGNITION: Overall cognitive status: Within functional limits for tasks assessed     SENSATION: WFL  POSTURE: rounded shoulders, forward head, weight shift right, and scoliosis  UPPER EXTREMITY ROM:   Active ROM Right eval Left eval Left 12/13/22  Shoulder flexion 152 89 * 102  Shoulder extension     Shoulder abduction 144 55 * 79  Shoulder adduction     Shoulder internal rotation     Shoulder external rotation 71 45 * 61  Elbow flexion     Elbow extension     Wrist flexion     Wrist extension     Wrist ulnar deviation     Wrist radial deviation     Wrist pronation     Wrist supination      Passive ROM Left eval  Shoulder flexion 91  Shoulder extension   Shoulder abduction 64  Shoulder adduction   Shoulder internal rotation 31 at 30 ABD  Shoulder external rotation   Elbow flexion   Elbow extension   Wrist flexion   Wrist extension   Wrist ulnar deviation   Wrist radial deviation   Wrist pronation   Wrist supination   (Blank rows = not tested) * = pain  UPPER EXTREMITY MMT:  (deferred on eval)  MMT Right eval Left eval  Shoulder flexion    Shoulder extension    Shoulder abduction    Shoulder adduction    Shoulder internal rotation    Shoulder external rotation    Middle trapezius    Lower trapezius    Elbow flexion    Elbow extension    Wrist flexion    Wrist extension    Wrist ulnar deviation    Wrist radial deviation    Wrist pronation    Wrist supination    Grip strength (lbs)    (Blank rows = not tested)  PALPATION:  TTP with taut bands and TPs in L posterolateral shoulder and pecs    TODAY'S TREATMENT:   12/20/22 THERAPEUTIC  EXERCISE: to improve flexibility, strength and mobility.  Verbal and tactile cues throughout for technique.  Pulleys: L shoulder flexion & scaption x 3 min each Standing L shoulder isometrics at wall - flexion, ABD, extension, IR & ER 5 x 5" at ~50% effort L shoulder  reactive IR isometric step-outs with YTB 2 x 5 L shoulder reactive ER isometric step-outs with YTB x 5, discontinued d/t increased pain in L lateral neck Sidelying L shoulder ER + scap retraction 2 x 10 Sidelying upper-cut shoulder flexion + scap retraction 2 x 10 Sidelying hitch-hiker shoulder ABD + scap retraction  x 10  MANUAL THERAPY: To promote normalized muscle tension, improved flexibility, improved joint mobility, increased ROM, and reduced pain. STM/TPR to L UT, LS, rhomboids, infraspinatus and teres group   MODALITIES: IFC to L UT & posterior shoulder complex (rhomboids and teres group), 80-150 Hz, intensity to patient tolerance x 15 min  Moist heat to L upper shoulder/L lateral neck and scapula x 15 min    12/16/22 THERAPEUTIC EXERCISE: to improve flexibility, strength and mobility.  Verbal and tactile cues throughout for technique.  Pulleys: L shoulder flexion & scaption x 3 min each Chin tucks 5" hold 2x10  UT Stretch x5 10" hold  Sidelying L shoulder ER + scap retraction 2 x 10 Sidelying upper-cut shoulder flexion + scap retraction 2 x 10 Standing RTB scap retraction + rows to neutral 10 x 3" - cues to avoid excessive shoulder extension to avoid rounding shoulders forward Standing noodle bt shoulder blade: wand AAROM Flx, Abd, ER (caused some pain so only able to get 5 repetitions) w/ yardstick x10   MANUAL THERAPY: To promote normalized muscle tension, improved flexibility, improved joint mobility, increased ROM, and reduced pain. STM/TPR to L UT, LS, rhomboids, subscapularis and teres group L scapular mobs - all directions with emphasis on scap retraction and depression  MODALITIES: IFC to L UT & Infraspinatus , 80-150 Hz, intensity to patient tolerance x 15 min  Moist heat to L lateral neck x 15 min     12/13/22 THERAPEUTIC EXERCISE: to improve flexibility, strength and mobility.  Verbal and tactile cues throughout for technique.  Pulleys: L shoulder flexion &  scaption x 3 min each Sidelying L shoulder ER + scap retraction 2 x 10 Sidelying upper-cut shoulder flexion + scap retraction x 10 Seated RTB scap retraction + rows to neutral 10 x 3" - cues to avoid excessive shoulder extension to avoid rounding shoulders foward  MANUAL THERAPY: To promote normalized muscle tension, improved flexibility, improved joint mobility, increased ROM, and reduced pain. STM/TPR to L UT, LS, rhomboids, subscapularis and teres group L scapular mobs - all directions with emphasis on scap retraction and depression  SELF CARE:  Instructed pt in use of Theracane for self-STM/TPR at home - pt reports her dtr has one that she might be able to borrow  MODALITIES: IFC to L UT & posterior shoulder complex (rhomboids and teres group), 80-150 Hz, intensity to patient tolerance x 15 min  Moist heat to L upper shoulder/L lateral neck and scapula x 15 min    PATIENT EDUCATION:  Education details: ongoing PT POC  Person educated: Patient Education method: Explanation Education comprehension: verbalized understanding   HOME EXERCISE PROGRAM: Access Code: [email or web address] URL: [email or web address] Date: 11/29/2022 Prepared by: Annie Paras  Exercises - Supine Shoulder Flexion AAROM with Dowel  - 2 x daily - 7 x weekly - 2 sets - 10 reps - 3 sec hold - Seated Shoulder  Abduction AAROM with Dowel  - 2 x daily - 7 x weekly - 2 sets - 10 reps - 3 sec hold - Seated Shoulder External Rotation AAROM with Cane and Hand in Neutral (Mirrored)  - 2 x daily - 7 x weekly - 2 sets - 10 reps - 3 sec hold - Isometric Shoulder External Rotation  - 1 x daily - 7 x weekly - 2 sets - 10 reps - 5 sec hold - Isometric Shoulder Abduction  - 1 x daily - 7 x weekly - 2 sets - 10 reps - 3 sec hold   ASSESSMENT:  CLINICAL IMPRESSION:  Allia continues to experience variable pain in her lateral neck and periscapular muscles which interferes with her sleep and prevents her from completing  her HEP at times. Increased pain noted after last session which lingered into the next day but she states pain seems to be less since. She had not recently attempted her isometrics at home initially due to the pain and then more recently having forgotten them, therefore reviewed with good tolerance before attempting progression to reactive isometric step-outs. No issues with IR step-out but increased pain in L lateral neck resulting discontinuation of ER step-outs. Several TPs/taut bands persisting in her lateral neck, periscapular muscles and upper arm which also initially limited tolerance for sidelying exercises but resolved with MT allowing for completion of intended exercises. Session concluded with estim and moist heat to promote further muscle relaxation and reduce post-exercise muscle soreness for improved sleep and activity tolerance.   OBJECTIVE IMPAIRMENTS: decreased activity tolerance, decreased mobility, decreased ROM, decreased strength, increased fascial restrictions, impaired perceived functional ability, increased muscle spasms, impaired flexibility, impaired UE functional use, improper body mechanics, postural dysfunction, and pain.   ACTIVITY LIMITATIONS: carrying, lifting, bed mobility, bathing, dressing, reach over head, and hygiene/grooming  PARTICIPATION LIMITATIONS: meal prep, cleaning, laundry, driving, shopping, community activity, and church  PERSONAL FACTORS: Age, Fitness, Past/current experiences, Time since onset of injury/illness/exacerbation, and 3+ comorbidities: R TKA 07/2019, B THR 2018 & 2017, posterior C1-2 cervical fusion 2018, rupture of flexor tendon of L hand with failed surgical repair 2020, Parkinson's, bladder cancer, HTN  are also affecting patient's functional outcome.   REHAB POTENTIAL: Good  CLINICAL DECISION MAKING: Evolving/moderate complexity  EVALUATION COMPLEXITY: Low   GOALS: Goals reviewed with patient? Yes  SHORT TERM GOALS: Target date:  12/20/2022  Patient will be independent with initial HEP to improve outcomes and carryover.  Baseline:  Goal status: MET  12/13/22  2.  Patient will improve L shoulder P/AAROM to >/= 115 flexion, 90 abduction and 45 ER w/o increased pain to promote more functional L UE use.  Baseline:  Goal status: IN PROGRESS  12/13/22 - refer to above ROM table - partially met for ER  LONG TERM GOALS: Target date: 01/17/2023  Patient will be independent with ongoing/advanced HEP for self-management at home.  Baseline:  Goal status: IN PROGRESS  2.  Patient will report 50-75% improvement in L shoulder pain to improve QOL.  Baseline:  Goal status: IN PROGRESS  3.  Patient to demonstrate improved upright posture with posterior shoulder girdle engaged to promote improved glenohumeral joint mobility. Baseline:  Goal status: IN PROGRESS  4.  Patient to improve L shoulder AROM to [facility] without pain provocation to allow for increased ease of ADLs.  Baseline:  Goal status: IN PROGRESS  5.  Patient will demonstrate improved L shoulder strength to >/= 4/5 for functional UE use. Baseline:  Goal status:  IN PROGRESS  6  Patient will report </= 55% on QuickDASH to demonstrate improved functional ability.  Baseline: 65.9 / 100 = 65.9 % Goal status: IN PROGRESS   PLAN:  PT FREQUENCY: 2x/week  PT DURATION: 8 weeks  PLANNED INTERVENTIONS: Therapeutic exercises, Therapeutic activity, Neuromuscular re-education, Balance training, Gait training, Patient/Family education, Self Care, Joint mobilization, Dry Needling, Electrical stimulation, Cryotherapy, Moist heat, scar mobilization, Taping, Vasopneumatic device, Ionotophoresis '4mg'$ /ml Dexamethasone, Manual therapy, and Re-evaluation  PLAN FOR NEXT SESSION: 10th visit PN - re-assess goals; L shoulder P/AA/AROM per protocol; progress L shoulder isometrics as tolerated, MT and modalities as needed/tolerated to address pain and abnormal muscle tension     Percival Spanish, PT 12/20/2022, 3:13 PM

## 2022-12-23 ENCOUNTER — Ambulatory Visit: Payer: Medicare Other | Admitting: Physical Therapy

## 2022-12-23 DIAGNOSIS — D2272 Melanocytic nevi of left lower limb, including hip: Secondary | ICD-10-CM | POA: Diagnosis not present

## 2022-12-23 DIAGNOSIS — D225 Melanocytic nevi of trunk: Secondary | ICD-10-CM | POA: Diagnosis not present

## 2022-12-23 DIAGNOSIS — L408 Other psoriasis: Secondary | ICD-10-CM | POA: Diagnosis not present

## 2022-12-23 DIAGNOSIS — L821 Other seborrheic keratosis: Secondary | ICD-10-CM | POA: Diagnosis not present

## 2022-12-23 DIAGNOSIS — L578 Other skin changes due to chronic exposure to nonionizing radiation: Secondary | ICD-10-CM | POA: Diagnosis not present

## 2022-12-23 DIAGNOSIS — D2271 Melanocytic nevi of right lower limb, including hip: Secondary | ICD-10-CM | POA: Diagnosis not present

## 2022-12-24 ENCOUNTER — Encounter: Payer: Self-pay | Admitting: Family Medicine

## 2022-12-24 ENCOUNTER — Other Ambulatory Visit: Payer: Self-pay | Admitting: Family Medicine

## 2022-12-24 MED ORDER — DIAZEPAM 5 MG PO TABS: ORAL_TABLET | ORAL | 1 refills | Status: DC

## 2022-12-24 NOTE — Telephone Encounter (Signed)
Medication sent..

## 2022-12-27 ENCOUNTER — Ambulatory Visit: Payer: Medicare Other | Admitting: Physical Therapy

## 2022-12-27 ENCOUNTER — Encounter: Payer: Self-pay | Admitting: Physical Therapy

## 2022-12-27 DIAGNOSIS — R293 Abnormal posture: Secondary | ICD-10-CM | POA: Diagnosis not present

## 2022-12-27 DIAGNOSIS — M25612 Stiffness of left shoulder, not elsewhere classified: Secondary | ICD-10-CM

## 2022-12-27 DIAGNOSIS — M25512 Pain in left shoulder: Secondary | ICD-10-CM | POA: Diagnosis not present

## 2022-12-27 DIAGNOSIS — M6281 Muscle weakness (generalized): Secondary | ICD-10-CM | POA: Diagnosis not present

## 2022-12-27 DIAGNOSIS — R2689 Other abnormalities of gait and mobility: Secondary | ICD-10-CM | POA: Diagnosis not present

## 2022-12-27 DIAGNOSIS — G8929 Other chronic pain: Secondary | ICD-10-CM | POA: Diagnosis not present

## 2022-12-27 NOTE — Therapy (Signed)
OUTPATIENT PHYSICAL THERAPY TREATMENT  Progress Note  Reporting Period 11/22/2022 to 12/27/2022   See note below for Objective Data and Assessment of Progress/Goals.     Patient Name: Stephanie [name] MRN: [id number] DOB:[birth year], 89 y.o., female Today's Date: 12/27/2022   END OF SESSION:  PT End of Session - 12/27/22 1354     Visit Number 10    Date for PT Re-Evaluation 01/17/23    Authorization Type Medicare & BCBS    Progress Note Due on Visit 20    PT Start Time 1354    PT Stop Time 1442    PT Time Calculation (min) 48 min    Activity Tolerance Patient tolerated treatment well    Behavior During Therapy [facility] for tasks assessed/performed                  Past Medical History:  Diagnosis Date   Allergy    Anemia    past hx    Arthropathy of cervical spine 11/27/2012   Right  Xray at [facility]  On 04/08/2016  AP, lateral, and lateral flexion and extension views of the cervical spine are submitted for evaluation.   No acute fracture identified in the cervical spine.   There is redemonstration of approximately 2 mm of C3 on C4 anterolisthesis in neutral which slightly increases in flexion relative to neutral and extension. Slight C5 on C6 retrolisthesis does not change i   Benign hypertension without congestive heart failure    Benign mole    right hcets mole, bleeds when dried with towel   Bilateral dry eyes    Bladder cancer ([place]) November 03, 2016   Cervical spondylosis    Cervicalgia    2 screws in place    Depression with anxiety Nov 03, 2016   prior to husbands death    Dyspnea    Gross hematuria    developed after first hip replacment, led to indicental finding of bladder tumor, bleeding now resolved    H/O total shoulder replacement, left    History of kidney stones    1970's   Left hand weakness    due to reinjury of flxor tendon s/p tendon surgery march 12-2018   Lumbar stenosis    Malignant tumor of urinary bladder ([place]) 07/2016   Pre-stage I   OA  (osteoarthritis)    Parkinson disease dx 2012   neurologist-  dr siddiqui at [place] [facility])     Dr Trudie Reed , [place] [facility]    Rupture of flexor tendon of hand 2020   right   Past Surgical History:  Procedure Laterality Date   BIOPSY MASS LEFT FIRST METACARPAL   06/23/2006   benign   CATARACT EXTRACTION W/ INTRAOCULAR LENS  IMPLANT, BILATERAL  1999 and 2003   CERVICAL FUSION  02/2017   c1-c2 ; reports she has 2 screws 2in long in place done at [facility] ; patient exhibits VERY LIMITED NECK ROM   COLONOSCOPY     CYSTOSCOPY W/ RETROGRADES Bilateral 09/22/2016   Procedure: CYSTOSCOPY WITH RETROGRADE PYELOGRAM;  Surgeon: Alexis Frock, MD;  [facility]  Service: Urology;  Laterality: Bilateral;   D & C HYSTEROSCOPY W/ RESECTION POLYP  07/11/2000   DILATION AND CURETTAGE OF UTERUS  1960's   EXCISION CYST AND DEBRIDEMENT RIGHT WIRST AND REMOVAL FORGEIGN BODY  01/09/2009   FLEXOR TENDON REPAIR Left 12/12/2018   Procedure: REPAIR/TRANSFER FLEXOR DIGITORUM PROFUNDUS OF LEFT SMALL FINGER;  Surgeon: Daryll Brod, MD;  Location: [place];  Service: Orthopedics;  Laterality: Left;   LAPAROSCOPY  yrs ago   infertility    METACARPOPHALANGEAL JOINT ARTHRODESIS Right 05/25/1996   REVERSE SHOULDER ARTHROPLASTY Left 10/14/2022   Procedure: REVERSE SHOULDER ARTHROPLASTY;  Surgeon: Justice Britain, MD;  Location: [facility];  Service: Orthopedics;  Laterality: Left;  169mn   TENDON REPAIR Left 01/15/2019   Hand   TONSILLECTOMY AND ADENOIDECTOMY  child   TOTAL HIP ARTHROPLASTY Left 07/07/2016   Procedure: LEFT TOTAL HIP ARTHROPLASTY ANTERIOR APPROACH;  Surgeon: FGaynelle Arabian MD;  Location: [facility];  Service: Orthopedics;  Laterality: Left;   TOTAL HIP ARTHROPLASTY Right 09/28/2017   Procedure: RIGHT TOTAL HIP ARTHROPLASTY ANTERIOR APPROACH;  Surgeon: AGaynelle Arabian MD;  Location: [facility];  Service: Orthopedics;  Laterality: Right;   TOTAL KNEE  ARTHROPLASTY Right 07/30/2019   Procedure: TOTAL KNEE ARTHROPLASTY;  Surgeon: AGaynelle Arabian MD;  Location: [facility];  Service: Orthopedics;  Laterality: Right;  553m   TRANSURETHRAL RESECTION OF BLADDER TUMOR N/A 09/22/2016   Procedure: TRANSURETHRAL RESECTION OF BLADDER TUMOR (TURBT);  Surgeon: ThAlexis FrockMD;  [facility];  Laterality: N/A;   Patient Active Problem List   Diagnosis Date Noted   S/P reverse total shoulder arthroplasty, left 10/14/2022   Preoperative cardiovascular examination 10/04/2022   SOB (shortness of breath) 08/02/2022   Insomnia 08/02/2022   Hyponatremia 08/01/2022   Shoulder pain, left 09/14/2021   History of COVID-19 01/06/2021   Diarrhea 09/03/2020   Vitamin D deficiency 09/03/2020   OA (osteoarthritis) of knee 07/30/2019   Tinea corporis 06/08/2019   Psoriatic arthritis ([place]/18/2020   Anemia 03/13/2018   Hot flashes 03/13/2018   S/P cervical spinal fusion 03/31/2017   Neck pain 12/12/2016   Depression with anxiety 10/17/2016   Bladder cancer ([place]/01/2016   OA (osteoarthritis) of hip 07/07/2016   Spinal stenosis of lumbar region with radiculopathy 04/08/2016   Spondylolisthesis of lumbar region 04/08/2016   Spondylosis of cervical region without myelopathy or radiculopathy 04/08/2016   Preventative health care 01/18/2016   Low back pain 01/18/2016   History of chicken pox    Hyperlipidemia 12/15/2014   Allergic rhinitis 06/25/2014   Allergy to bee sting 06/10/2014   TMJ tenderness 11/16/2013   Other malaise and fatigue 05/07/2013   Arthropathy of cervical spine ([place]01/13/2014   Muscle spasms of neck 11/27/2012   Parkinson's disease 05/24/2012   Conjunctivochalasis 03/09/2012   Epiretinal membrane 03/09/2012   Hyperopia with astigmatism and presbyopia 03/09/2012   Pseudophakia 03/09/2012   HTN (hypertension) 08/31/2011   Right knee pain 08/31/2011   Benign hypertensive heart disease  without heart failure 05/12/2011    PCP: BlMosie LukesMD   REFERRING PROVIDER: SuJustice BritainMD   REFERRING DIAG: [phone]-10-CM) - Presence of left artificial shoulder joint   THERAPY DIAG:  Stiffness of left shoulder, not elsewhere classified  Chronic left shoulder pain  Abnormal posture  Muscle weakness (generalized)  RATIONALE FOR EVALUATION AND TREATMENT: Rehabilitation  ONSET DATE: 10/14/2022 - L reverse TSA  NEXT MD VISIT: 01/05/23   SUBJECTIVE:  SUBJECTIVE STATEMENT:  Pt reports she had to miss her last visit due to a conflict with a dermatologist appt. Her pain has been better but still has clicking in her neck.  PAIN: Are you having pain? No  PERTINENT HISTORY:  L shoulder advanced OA - reverse TSA on 10/14/22; R TKA 07/2019, B THR 2018 & 2017, posterior C1-2 cervical fusion 2018, rupture of flexor tendon of L hand with failed surgical repair 2020, Parkinson's, psoriatic arthritis, bladder cancer, HTN    PRECAUTIONS: Shoulder  WEIGHT BEARING RESTRICTIONS: No  FALLS:  Has patient fallen in last 6 months? No  LIVING ENVIRONMENT: Lives with: lives alone Lives in: House/apartment Stairs: Yes: External: 4 steps; on right going up, on left going up, and can reach both Has following equipment at home: Single point cane, Walker - 2 wheeled, shower chair, bed side commode, and Grab bars  OCCUPATION: Retired  PLOF: Independent and Leisure: volunteering at [employer], bible study, walking 1/3 mile daily  PATIENT GOALS: "To get rid of my pain and be able to do things with both hands."   OBJECTIVE:   DIAGNOSTIC FINDINGS:  09/14/21 - L shoulder x-ray: IMPRESSION: 1. No acute bony abnormality. 2. Mild/moderate degenerative changes.  PATIENT SURVEYS:  Quick  Dash 65.9 / 100 = 65.9 % (eval); 45.5 / 100 = 45.5% (12/27/22)  COGNITION: Overall cognitive status: Within functional limits for tasks assessed     SENSATION: WFL  POSTURE: rounded shoulders, forward head, weight shift right, and scoliosis  UPPER EXTREMITY ROM:   Active ROM Right eval Left eval Left 12/13/22 Left 12/27/22  Shoulder flexion 152 89 * 102 116  Shoulder extension      Shoulder abduction 144 55 * 79 87  Shoulder adduction      Shoulder internal rotation    67  Shoulder external rotation 71 45 * 61 69  Elbow flexion      Elbow extension      Wrist flexion      Wrist extension      Wrist ulnar deviation      Wrist radial deviation      Wrist pronation      Wrist supination       Passive ROM Left eval Left 12/27/22  Shoulder flexion 91 126  Shoulder extension    Shoulder abduction 64 99  Shoulder adduction    Shoulder internal rotation 31 at 30 ABD 67  Shoulder external rotation  69  Elbow flexion    Elbow extension    Wrist flexion    Wrist extension    Wrist ulnar deviation    Wrist radial deviation    Wrist pronation    Wrist supination    (Blank rows = not tested) * = pain  UPPER EXTREMITY MMT:  (deferred on eval)  MMT Right 12/27/22 Left 12/27/22  Shoulder flexion 5 4-  Shoulder extension    Shoulder abduction 5 4  Shoulder adduction    Shoulder internal rotation 5 4+  Shoulder external rotation 5 4  Middle trapezius    Lower trapezius    Elbow flexion    Elbow extension    Wrist flexion    Wrist extension    Wrist ulnar deviation    Wrist radial deviation    Wrist pronation    Wrist supination    Grip strength (lbs)    (Blank rows = not tested)  PALPATION:  TTP with taut bands and TPs in L posterolateral shoulder and pecs  TODAY'S TREATMENT:   12/27/22 THERAPEUTIC EXERCISE: to improve flexibility, strength and mobility.  Verbal and tactile cues throughout for technique.  Pulleys: L shoulder flexion & scaption x 3 min each L  shoulder reactive IR isometric step-outs with YTB x 10 L shoulder reactive ER isometric step-outs with YTB x 10 L shoulder flexion cabinet reaches to 1st shelf x 5, lifting 4 oz.Theraputty tub x 10 (modified with velcro strap to make gripping tub easier due limited grip from pre-existing tendon injury and psoriatic arthritis) L shoulder scaption cabinet reaches to 1st shelf x 10, lifting 4 oz.Theraputty tub x 10 (modified with velcro strap to make gripping tub easier)  THERAPEUTIC ACTIVITIES: QuickDASH: 45.5 / 100 = 45.5% ROM & MMT assessment   12/20/22 THERAPEUTIC EXERCISE: to improve flexibility, strength and mobility.  Verbal and tactile cues throughout for technique.  Pulleys: L shoulder flexion & scaption x 3 min each Standing L shoulder isometrics at wall - flexion, ABD, extension, IR & ER 5 x 5" at ~50% effort L shoulder reactive IR isometric step-outs with YTB 2 x 5 L shoulder reactive ER isometric step-outs with YTB x 5, discontinued d/t increased pain in L lateral neck Sidelying L shoulder ER + scap retraction 2 x 10 Sidelying upper-cut shoulder flexion + scap retraction 2 x 10 Sidelying hitch-hiker shoulder ABD + scap retraction  x 10  MANUAL THERAPY: To promote normalized muscle tension, improved flexibility, improved joint mobility, increased ROM, and reduced pain. STM/TPR to L UT, LS, rhomboids, infraspinatus and teres group   MODALITIES: IFC to L UT & posterior shoulder complex (rhomboids and teres group), 80-150 Hz, intensity to patient tolerance x 15 min  Moist heat to L upper shoulder/L lateral neck and scapula x 15 min    12/16/22 THERAPEUTIC EXERCISE: to improve flexibility, strength and mobility.  Verbal and tactile cues throughout for technique.  Pulleys: L shoulder flexion & scaption x 3 min each Chin tucks 5" hold 2x10  UT Stretch x5 10" hold  Sidelying L shoulder ER + scap retraction 2 x 10 Sidelying upper-cut shoulder flexion + scap retraction 2 x 10 Standing  RTB scap retraction + rows to neutral 10 x 3" - cues to avoid excessive shoulder extension to avoid rounding shoulders forward Standing noodle bt shoulder blade: wand AAROM Flx, Abd, ER (caused some pain so only able to get 5 repetitions) w/ yardstick x10   MANUAL THERAPY: To promote normalized muscle tension, improved flexibility, improved joint mobility, increased ROM, and reduced pain. STM/TPR to L UT, LS, rhomboids, subscapularis and teres group L scapular mobs - all directions with emphasis on scap retraction and depression  MODALITIES: IFC to L UT & Infraspinatus , 80-150 Hz, intensity to patient tolerance x 15 min  Moist heat to L lateral neck x 15 min     PATIENT EDUCATION:  Education details: progress with PT and ongoing PT POC  Person educated: Patient Education method: Explanation Education comprehension: verbalized understanding   HOME EXERCISE PROGRAM: Access Code: [email or web address] URL: [email or web address] Date: 11/29/2022 Prepared by: Annie Paras  Exercises - Supine Shoulder Flexion AAROM with Dowel  - 2 x daily - 7 x weekly - 2 sets - 10 reps - 3 sec hold - Seated Shoulder Abduction AAROM with Dowel  - 2 x daily - 7 x weekly - 2 sets - 10 reps - 3 sec hold - Seated Shoulder External Rotation AAROM with Cane and Hand in Neutral (Mirrored)  - 2 x daily - 7 x weekly -  2 sets - 10 reps - 3 sec hold - Isometric Shoulder External Rotation  - 1 x daily - 7 x weekly - 2 sets - 10 reps - 5 sec hold - Isometric Shoulder Abduction  - 1 x daily - 7 x weekly - 2 sets - 10 reps - 3 sec hold   ASSESSMENT:  CLINICAL IMPRESSION:  Emerlyn reports her L shoulder pain has improved by 90% since surgery/start of PT but she continues to have intermittent L lateral neck and periscapular pain which limits her activity/exercise tolerance and interferes with her sleep. Functionally her L shoulder ROM is improving with gains observed in overhead reach as well as improving FIR/FER  allowing her to better bathe and dry herself, with QuickDASH score reduced from 65.9% to 45.5% disability (some of limitations in L UE use also due to torn tendons and arthritis in her L hand). MMT revealing ability to demonstrate ~4/5 strength in L shoulder available ROM. Kassidee is demonstrating good progress with PT with all STGs essentially met along with LTGs #2 and 6 met, and progress observed toward remaining goals. She will continue to benefit from skilled PT to address remaining pain, ROM and strength deficits to improve mobility and activity tolerance for functional L UE use with decreased pain interference.   OBJECTIVE IMPAIRMENTS: decreased activity tolerance, decreased mobility, decreased ROM, decreased strength, increased fascial restrictions, impaired perceived functional ability, increased muscle spasms, impaired flexibility, impaired UE functional use, improper body mechanics, postural dysfunction, and pain.   ACTIVITY LIMITATIONS: carrying, lifting, bed mobility, bathing, dressing, reach over head, and hygiene/grooming  PARTICIPATION LIMITATIONS: meal prep, cleaning, laundry, driving, shopping, community activity, and church  PERSONAL FACTORS: Age, Fitness, Past/current experiences, Time since onset of injury/illness/exacerbation, and 3+ comorbidities: R TKA 07/2019, B THR 2018 & 2017, posterior C1-2 cervical fusion 2018, rupture of flexor tendon of L hand with failed surgical repair 2020, Parkinson's, psoriatic arthritis, bladder cancer, HTN  are also affecting patient's functional outcome.   REHAB POTENTIAL: Good  CLINICAL DECISION MAKING: Evolving/moderate complexity  EVALUATION COMPLEXITY: Low   GOALS: Goals reviewed with patient? Yes  SHORT TERM GOALS: Target date: 12/20/2022  Patient will be independent with initial HEP to improve outcomes and carryover.  Baseline:  Goal status: MET  12/13/22  2.  Patient will improve L shoulder P/AAROM to >/= 115 flexion, 90 abduction  and 45 ER w/o increased pain to promote more functional L UE use.  Baseline:  Goal status: PARTIALLY MET  12/27/22 - Met for flexion & ER, nearly met for ABD  LONG TERM GOALS: Target date: 01/17/2023  Patient will be independent with ongoing/advanced HEP for self-management at home.  Baseline:  Goal status: IN PROGRESS  12/27/22 - Met for current HEP  2.  Patient will report 50-75% improvement in L shoulder pain to improve QOL.  Baseline:  Goal status: MET  12/27/22 - 90% improvement  3.  Patient to demonstrate improved upright posture with posterior shoulder girdle engaged to promote improved glenohumeral joint mobility. Baseline:  Goal status: IN PROGRESS  12/27/22 - Good awareness of desired neutral neck and shoulder posture but continued fwd head with R shift  4.  Patient to improve L shoulder AROM to [facility] without pain provocation to allow for increased ease of ADLs.  Baseline:  Goal status: IN PROGRESS  12/27/22 - L shoulder ROM progressing in all planes - tp noting increased ease of drying her back after showering  5.  Patient will demonstrate improved L shoulder strength  to >/= 4/5 for functional UE use. Baseline:  Goal status: IN PROGRESS  12/27/22 - Met except flexion 4-/5  6  Patient will report </= 55% on QuickDASH to demonstrate improved functional ability.  Baseline: 65.9 / 100 = 65.9% Goal status: MET  12/27/22 - QuickDASH: 45.5 / 100 = 45.5%   PLAN:  PT FREQUENCY: 2x/week  PT DURATION: 8 weeks  PLANNED INTERVENTIONS: Therapeutic exercises, Therapeutic activity, Neuromuscular re-education, Balance training, Gait training, Patient/Family education, Self Care, Joint mobilization, Dry Needling, Electrical stimulation, Cryotherapy, Moist heat, scar mobilization, Taping, Vasopneumatic device, Ionotophoresis 62m/ml Dexamethasone, Manual therapy, and Re-evaluation  PLAN FOR NEXT SESSION: L shoulder P/AA/AROM per protocol; progress L shoulder and scapular strengthening as  tolerated, MT and modalities as needed/tolerated to address pain and abnormal muscle tension     JPercival Spanish PT 12/27/2022, 4:40 PM

## 2022-12-30 ENCOUNTER — Encounter: Payer: Self-pay | Admitting: Physical Therapy

## 2022-12-30 ENCOUNTER — Ambulatory Visit: Payer: Medicare Other | Admitting: Physical Therapy

## 2022-12-30 DIAGNOSIS — M25512 Pain in left shoulder: Secondary | ICD-10-CM | POA: Diagnosis not present

## 2022-12-30 DIAGNOSIS — M25612 Stiffness of left shoulder, not elsewhere classified: Secondary | ICD-10-CM

## 2022-12-30 DIAGNOSIS — M6281 Muscle weakness (generalized): Secondary | ICD-10-CM

## 2022-12-30 DIAGNOSIS — G8929 Other chronic pain: Secondary | ICD-10-CM | POA: Diagnosis not present

## 2022-12-30 DIAGNOSIS — R2689 Other abnormalities of gait and mobility: Secondary | ICD-10-CM | POA: Diagnosis not present

## 2022-12-30 DIAGNOSIS — R293 Abnormal posture: Secondary | ICD-10-CM | POA: Diagnosis not present

## 2022-12-30 DIAGNOSIS — R2681 Unsteadiness on feet: Secondary | ICD-10-CM

## 2022-12-30 NOTE — Therapy (Signed)
OUTPATIENT PHYSICAL THERAPY TREATMENT   Patient Name: Stephanie [name] MRN: [id number] DOB:11-05-1932, 89 y.o., female Today's Date: 12/30/2022   END OF SESSION:  PT End of Session - 12/30/22 1002     Visit Number 11    Date for PT Re-Evaluation 01/17/23    Authorization Type Medicare & BCBS    Progress Note Due on Visit 20    PT Start Time 1000    PT Stop Time 1100    PT Time Calculation (min) 60 min    Activity Tolerance Patient tolerated treatment well    Behavior During Therapy WFL for tasks assessed/performed                   Past Medical History:  Diagnosis Date   Allergy    Anemia    past hx    Arthropathy of cervical spine 11/27/2012   Right  Xray at [facility]  On 04/08/2016  AP, lateral, and lateral flexion and extension views of the cervical spine are submitted for evaluation.   No acute fracture identified in the cervical spine.   There is redemonstration of approximately 2 mm of C3 on C4 anterolisthesis in neutral which slightly increases in flexion relative to neutral and extension. Slight C5 on C6 retrolisthesis does not change i   Benign hypertension without congestive heart failure    Benign mole    right hcets mole, bleeds when dried with towel   Bilateral dry eyes    Bladder cancer ([place]) October 24, 2016   Cervical spondylosis    Cervicalgia    2 screws in place    Depression with anxiety October 24, 2016   prior to husbands death    Dyspnea    Gross hematuria    developed after first hip replacment, led to indicental finding of bladder tumor, bleeding now resolved    H/O total shoulder replacement, left    History of kidney stones    1970's   Left hand weakness    due to reinjury of flxor tendon s/p tendon surgery march 12-2018   Lumbar stenosis    Malignant tumor of urinary bladder ([place]) 07/2016   Pre-stage I   OA (osteoarthritis)    Parkinson disease dx 2012   neurologist-  dr siddiqui at [place] [facility])     Dr Trudie Reed , [place]  rheum    Rupture of flexor tendon of hand 2020   right   Past Surgical History:  Procedure Laterality Date   BIOPSY MASS LEFT FIRST METACARPAL   06/23/2006   benign   CATARACT EXTRACTION W/ INTRAOCULAR LENS  IMPLANT, BILATERAL  1999 and 2003   CERVICAL FUSION  02/2017   c1-c2 ; reports she has 2 screws 2in long in place done at [facility] ; patient exhibits VERY LIMITED NECK ROM   COLONOSCOPY     CYSTOSCOPY W/ RETROGRADES Bilateral 09/22/2016   Procedure: CYSTOSCOPY WITH RETROGRADE PYELOGRAM;  Surgeon: Alexis Frock, MD;  Location: [facility];  Service: Urology;  Laterality: Bilateral;   D & C HYSTEROSCOPY W/ RESECTION POLYP  07/11/2000   DILATION AND CURETTAGE OF UTERUS  1960's   EXCISION CYST AND DEBRIDEMENT RIGHT WIRST AND REMOVAL FORGEIGN BODY  01/09/2009   FLEXOR TENDON REPAIR Left 12/12/2018   Procedure: REPAIR/TRANSFER FLEXOR DIGITORUM PROFUNDUS OF LEFT SMALL FINGER;  Surgeon: Daryll Brod, MD;  Location: [facility];  Service: Orthopedics;  Laterality: Left;   LAPAROSCOPY  yrs ago   infertility  METACARPOPHALANGEAL JOINT ARTHRODESIS Right 05/25/1996   REVERSE SHOULDER ARTHROPLASTY Left 10/14/2022   Procedure: REVERSE SHOULDER ARTHROPLASTY;  Surgeon: Justice Britain, MD;  Location: [facility];  Service: Orthopedics;  Laterality: Left;  156mn   TENDON REPAIR Left 01/15/2019   Hand   TONSILLECTOMY AND ADENOIDECTOMY  child   TOTAL HIP ARTHROPLASTY Left 07/07/2016   Procedure: LEFT TOTAL HIP ARTHROPLASTY ANTERIOR APPROACH;  Surgeon: FGaynelle Arabian MD;  Location: [facility];  Service: Orthopedics;  Laterality: Left;   TOTAL HIP ARTHROPLASTY Right 09/28/2017   Procedure: RIGHT TOTAL HIP ARTHROPLASTY ANTERIOR APPROACH;  Surgeon: AGaynelle Arabian MD;  Location: [facility];  Service: Orthopedics;  Laterality: Right;   TOTAL KNEE ARTHROPLASTY Right 07/30/2019   Procedure: TOTAL KNEE ARTHROPLASTY;  Surgeon: AGaynelle Arabian MD;  Location: [facility];  Service: Orthopedics;   Laterality: Right;  568m   TRANSURETHRAL RESECTION OF BLADDER TUMOR N/A 09/22/2016   Procedure: TRANSURETHRAL RESECTION OF BLADDER TUMOR (TURBT);  Surgeon: ThAlexis FrockMD;  [facility] Service[facility];  Laterality: N/A;   Patient Active Problem List   Diagnosis Date Noted   S/P reverse total shoulder arthroplasty, left 10/14/2022   Preoperative cardiovascular examination 10/04/2022   SOB (shortness of breath) 08/02/2022   Insomnia 08/02/2022   Hyponatremia 08/01/2022   Shoulder pain, left 09/14/2021   History of COVID-19 01/06/2021   Diarrhea 09/03/2020   Vitamin D deficiency 09/03/2020   OA (osteoarthritis) of knee 07/30/2019   Tinea corporis 06/08/2019   Psoriatic arthritis ([place]05/18/2020   Anemia 03/13/2018   Hot flashes 03/13/2018   S/P cervical spinal fusion 03/31/2017   Neck pain 12/12/2016   Depression with anxiety 10/17/2016   Bladder cancer ([place]/01/2016   OA (osteoarthritis) of hip 07/07/2016   Spinal stenosis of lumbar region with radiculopathy 04/08/2016   Spondylolisthesis of lumbar region 04/08/2016   Spondylosis of cervical region without myelopathy or radiculopathy 04/08/2016   Preventative health care 01/18/2016   Low back pain 01/18/2016   History of chicken pox    Hyperlipidemia 12/15/2014   Allergic rhinitis 06/25/2014   Allergy to bee sting 06/10/2014   TMJ tenderness 11/16/2013   Other malaise and fatigue 05/07/2013   Arthropathy of cervical spine ([facility]/13/2014   Muscle spasms of neck 11/27/2012   Parkinson's disease 05/24/2012   Conjunctivochalasis 03/09/2012   Epiretinal membrane 03/09/2012   Hyperopia with astigmatism and presbyopia 03/09/2012   Pseudophakia 03/09/2012   HTN (hypertension) 08/31/2011   Right knee pain 08/31/2011   Benign hypertensive heart disease without heart failure 05/12/2011    PCP: BlMosie LukesMD   REFERRING PROVIDER: SuJustice BritainMD   REFERRING DIAG: [phone]-10-CM)  - Presence of left artificial shoulder joint   THERAPY DIAG:  Stiffness of left shoulder, not elsewhere classified  Chronic left shoulder pain  Abnormal posture  Muscle weakness (generalized)  Other abnormalities of gait and mobility  Unsteadiness on feet  RATIONALE FOR EVALUATION AND TREATMENT: Rehabilitation  ONSET DATE: 10/14/2022 - L reverse TSA  NEXT MD VISIT: 01/05/23   SUBJECTIVE:  SUBJECTIVE STATEMENT:  Left lass session with no pain but has had a lot of pain since then, had to take Meloxicam yesterday and seems to be helping her but does not want to take too much of it due to GI.   PAIN: Are you having pain? Yes: NPRS scale: 4-5/10 Pain location: L shoulder, neck  Pain description: achy, throbbing  Aggravating factors: movement  Relieving factors: heat, medication   PERTINENT HISTORY:  L shoulder advanced OA - reverse TSA on 10/14/22; R TKA 07/2019, B THR 2018 & 2017, posterior C1-2 cervical fusion 2018, rupture of flexor tendon of L hand with failed surgical repair 2020, Parkinson's, psoriatic arthritis, bladder cancer, HTN    PRECAUTIONS: Shoulder  WEIGHT BEARING RESTRICTIONS: No  FALLS:  Has patient fallen in last 6 months? No  LIVING ENVIRONMENT: Lives with: lives alone Lives in: House/apartment Stairs: Yes: External: 4 steps; on right going up, on left going up, and can reach both Has following equipment at home: Single point cane, Walker - 2 wheeled, shower chair, bed side commode, and Grab bars  OCCUPATION: Retired  PLOF: Independent and Leisure: volunteering at [employer], bible study, walking 1/3 mile daily  PATIENT GOALS: "To get rid of my pain and be able to do things with both hands."   OBJECTIVE:   DIAGNOSTIC FINDINGS:  09/14/21 - L shoulder  x-ray: IMPRESSION: 1. No acute bony abnormality. 2. Mild/moderate degenerative changes.  PATIENT SURVEYS:  Quick Dash 65.9 / 100 = 65.9 % (eval); 45.5 / 100 = 45.5% (12/27/22)  COGNITION: Overall cognitive status: Within functional limits for tasks assessed     SENSATION: WFL  POSTURE: rounded shoulders, forward head, weight shift right, and scoliosis  UPPER EXTREMITY ROM:   Active ROM Right eval Left eval Left 12/13/22 Left 12/27/22  Shoulder flexion 152 89 * 102 116  Shoulder extension      Shoulder abduction 144 55 * 79 87  Shoulder adduction      Shoulder internal rotation    67  Shoulder external rotation 71 45 * 61 69  Elbow flexion      Elbow extension      Wrist flexion      Wrist extension      Wrist ulnar deviation      Wrist radial deviation      Wrist pronation      Wrist supination       Passive ROM Left eval Left 12/27/22  Shoulder flexion 91 126  Shoulder extension    Shoulder abduction 64 99  Shoulder adduction    Shoulder internal rotation 31 at 30 ABD 67  Shoulder external rotation  69  Elbow flexion    Elbow extension    Wrist flexion    Wrist extension    Wrist ulnar deviation    Wrist radial deviation    Wrist pronation    Wrist supination    (Blank rows = not tested) * = pain  UPPER EXTREMITY MMT:  (deferred on eval)  MMT Right 12/27/22 Left 12/27/22  Shoulder flexion 5 4-  Shoulder extension    Shoulder abduction 5 4  Shoulder adduction    Shoulder internal rotation 5 4+  Shoulder external rotation 5 4  Middle trapezius    Lower trapezius    Elbow flexion    Elbow extension    Wrist flexion    Wrist extension    Wrist ulnar deviation    Wrist radial deviation    Wrist pronation  Wrist supination    Grip strength (lbs)    (Blank rows = not tested)  PALPATION:  TTP with taut bands and TPs in L posterolateral shoulder and pecs    TODAY'S TREATMENT:   12/30/22 THERAPEUTIC EXERCISE: to improve flexibility, strength and  mobility.  Verbal and tactile cues throughout for technique. Pulleys: L shoulder flexion & scaption x 3 min each Supine Chin Tucks 2x10 5" hold  Supine upper-cut shoulder flexion + scap retraction 2 x 10 Supine hitch-hiker shoulder ABD + scap retraction  x 10 L shoulder reactive IR isometric step-outs with YTB x 10 L shoulder reactive ER isometric step-outs with YTB x 10 L shoulder reactive Flx Isometric step-outs with YTB x 10 L shoulder reactive Ext Isometric step-outs with YTB x 10 L shoulder reactive Abd Isometric step-outs with YTB x 10 L shoulder flexion cabinet reaches to 1st shelf  lifting 4 oz.Theraputty tub 2 x 10 (modified with velcro strap to make gripping tub easier due limited grip from pre-existing tendon injury and psoriatic arthritis) L shoulder scaption cabinet reaches to 1st shelf x 10, lifting 4 oz.Theraputty tub x 10 (modified with velcro strap to make gripping tub easier)  MANUAL THERAPY: To promote normalized muscle tension, improved flexibility, improved joint mobility, increased ROM, and reduced pain. STM to L Traps & LS   MODALITIES: IFC to L UT & Infraspinatus , 80-150 Hz, intensity to patient tolerance x 15 min  Moist heat to L lateral neck and periscapular area x 15 min     12/27/22 THERAPEUTIC EXERCISE: to improve flexibility, strength and mobility.  Verbal and tactile cues throughout for technique.  Pulleys: L shoulder flexion & scaption x 3 min each L shoulder reactive IR isometric step-outs with YTB x 10 L shoulder reactive ER isometric step-outs with YTB x 10 L shoulder flexion cabinet reaches to 1st shelf x 5, lifting 4 oz.Theraputty tub x 10 (modified with velcro strap to make gripping tub easier due limited grip from pre-existing tendon injury and psoriatic arthritis) L shoulder scaption cabinet reaches to 1st shelf x 10, lifting 4 oz.Theraputty tub x 10 (modified with velcro strap to make gripping tub easier)  THERAPEUTIC ACTIVITIES: QuickDASH: 45.5 /  100 = 45.5% ROM & MMT assessment   12/20/22 THERAPEUTIC EXERCISE: to improve flexibility, strength and mobility.  Verbal and tactile cues throughout for technique.  Pulleys: L shoulder flexion & scaption x 3 min each Standing L shoulder isometrics at wall - flexion, ABD, extension, IR & ER 5 x 5" at ~50% effort L shoulder reactive IR isometric step-outs with YTB 2 x 5 L shoulder reactive ER isometric step-outs with YTB x 5, discontinued d/t increased pain in L lateral neck Sidelying L shoulder ER + scap retraction 2 x 10 Sidelying upper-cut shoulder flexion + scap retraction 2 x 10 Sidelying hitch-hiker shoulder ABD + scap retraction  x 10  MANUAL THERAPY: To promote normalized muscle tension, improved flexibility, improved joint mobility, increased ROM, and reduced pain. STM/TPR to L UT, LS, rhomboids, infraspinatus and teres group   MODALITIES: IFC to L UT & posterior shoulder complex (rhomboids and teres group), 80-150 Hz, intensity to patient tolerance x 15 min  Moist heat to L upper shoulder/L lateral neck and scapula x 15 min    PATIENT EDUCATION:  Education details: progress with PT and ongoing PT POC  Person educated: Patient Education method: Explanation Education comprehension: verbalized understanding   HOME EXERCISE PROGRAM: Access Code: [email or web address] URL: [email or web address] Date: 11/29/2022 Prepared by: Mechele Claude  Kreis  Exercises - Supine Shoulder Flexion AAROM with Dowel  - 2 x daily - 7 x weekly - 2 sets - 10 reps - 3 sec hold - Seated Shoulder Abduction AAROM with Dowel  - 2 x daily - 7 x weekly - 2 sets - 10 reps - 3 sec hold - Seated Shoulder External Rotation AAROM with Cane and Hand in Neutral (Mirrored)  - 2 x daily - 7 x weekly - 2 sets - 10 reps - 3 sec hold - Isometric Shoulder External Rotation  - 1 x daily - 7 x weekly - 2 sets - 10 reps - 5 sec hold - Isometric Shoulder Abduction  - 1 x daily - 7 x weekly - 2 sets - 10 reps - 3 sec  hold   ASSESSMENT:  CLINICAL IMPRESSION:  Pt reports pain returning since last session on Monday even though she left the session with no pain. Pt continues to exhibit extreme tenderness on her L trapezius muscle and overall neck region. Pt had a difficult time tolerating MT today but several TrP where felt and released to try and decrease overall muscle tension. Pt was able to tolerate therapeutic exercise today, adding 3 new directions with the YTB isometric walkout showing that her L shoulder is able to tolerate more. Session ended with e-stim and MHP to try and relax the muscle tension she feels to hopefully decrease her overall pain. Pt will continue to benefit from skilled PT to address remaining pain, ROM and strength deficits to improve mobility and activity tolerance for functional L UE use with decreased pain interference.   OBJECTIVE IMPAIRMENTS: decreased activity tolerance, decreased mobility, decreased ROM, decreased strength, increased fascial restrictions, impaired perceived functional ability, increased muscle spasms, impaired flexibility, impaired UE functional use, improper body mechanics, postural dysfunction, and pain.   ACTIVITY LIMITATIONS: carrying, lifting, bed mobility, bathing, dressing, reach over head, and hygiene/grooming  PARTICIPATION LIMITATIONS: meal prep, cleaning, laundry, driving, shopping, community activity, and church  PERSONAL FACTORS: Age, Fitness, Past/current experiences, Time since onset of injury/illness/exacerbation, and 3+ comorbidities: R TKA 07/2019, B THR 2018 & 2017, posterior C1-2 cervical fusion 2018, rupture of flexor tendon of L hand with failed surgical repair 2020, Parkinson's, psoriatic arthritis, bladder cancer, HTN  are also affecting patient's functional outcome.   REHAB POTENTIAL: Good  CLINICAL DECISION MAKING: Evolving/moderate complexity  EVALUATION COMPLEXITY: Low   GOALS: Goals reviewed with patient? Yes  SHORT TERM GOALS:  Target date: 12/20/2022  Patient will be independent with initial HEP to improve outcomes and carryover.  Baseline:  Goal status: MET  12/13/22  2.  Patient will improve L shoulder P/AAROM to >/= 115 flexion, 90 abduction and 45 ER w/o increased pain to promote more functional L UE use.  Baseline:  Goal status: PARTIALLY MET  12/27/22 - Met for flexion & ER, nearly met for ABD  LONG TERM GOALS: Target date: 01/17/2023  Patient will be independent with ongoing/advanced HEP for self-management at home.  Baseline:  Goal status: IN PROGRESS  12/27/22 - Met for current HEP  2.  Patient will report 50-75% improvement in L shoulder pain to improve QOL.  Baseline:  Goal status: MET  12/27/22 - 90% improvement  3.  Patient to demonstrate improved upright posture with posterior shoulder girdle engaged to promote improved glenohumeral joint mobility. Baseline:  Goal status: IN PROGRESS  12/27/22 - Good awareness of desired neutral neck and shoulder posture but continued fwd head with R shift  4.  Patient  to improve L shoulder AROM to [facility] without pain provocation to allow for increased ease of ADLs.  Baseline:  Goal status: IN PROGRESS  12/27/22 - L shoulder ROM progressing in all planes - tp noting increased ease of drying her back after showering  5.  Patient will demonstrate improved L shoulder strength to >/= 4/5 for functional UE use. Baseline:  Goal status: IN PROGRESS  12/27/22 - Met except flexion 4-/5  6  Patient will report </= 55% on QuickDASH to demonstrate improved functional ability.  Baseline: 65.9 / 100 = 65.9% Goal status: MET  12/27/22 - QuickDASH: 45.5 / 100 = 45.5%   PLAN:  PT FREQUENCY: 2x/week  PT DURATION: 8 weeks  PLANNED INTERVENTIONS: Therapeutic exercises, Therapeutic activity, Neuromuscular re-education, Balance training, Gait training, Patient/Family education, Self Care, Joint mobilization, Dry Needling, Electrical stimulation, Cryotherapy, Moist heat, scar  mobilization, Taping, Vasopneumatic device, Ionotophoresis 50m/ml Dexamethasone, Manual therapy, and Re-evaluation  PLAN FOR NEXT SESSION: L shoulder P/AA/AROM per protocol; progress L shoulder and scapular strengthening as tolerated, MT and modalities as needed/tolerated to address pain and abnormal muscle tension     Analina Filla Romero-Perozo, Student-PT 12/30/2022, 10:53 AM

## 2023-01-03 ENCOUNTER — Encounter: Payer: Self-pay | Admitting: Physical Therapy

## 2023-01-04 ENCOUNTER — Ambulatory Visit: Payer: Medicare Other | Admitting: Physical Therapy

## 2023-01-04 ENCOUNTER — Encounter: Payer: Self-pay | Admitting: Physical Therapy

## 2023-01-04 DIAGNOSIS — R2689 Other abnormalities of gait and mobility: Secondary | ICD-10-CM | POA: Diagnosis not present

## 2023-01-04 DIAGNOSIS — M25612 Stiffness of left shoulder, not elsewhere classified: Secondary | ICD-10-CM

## 2023-01-04 DIAGNOSIS — M6281 Muscle weakness (generalized): Secondary | ICD-10-CM

## 2023-01-04 DIAGNOSIS — R293 Abnormal posture: Secondary | ICD-10-CM | POA: Diagnosis not present

## 2023-01-04 DIAGNOSIS — G8929 Other chronic pain: Secondary | ICD-10-CM | POA: Diagnosis not present

## 2023-01-04 DIAGNOSIS — M25512 Pain in left shoulder: Secondary | ICD-10-CM | POA: Diagnosis not present

## 2023-01-04 NOTE — Therapy (Signed)
OUTPATIENT PHYSICAL THERAPY TREATMENT  Progress Note  Reporting Period 12/27/2022 to 01/04/2023   See note below for Objective Data and Assessment of Progress/Goals.      Patient Name: Stephanie [name] MRN: [id number] DOB:[birth year], 89 y.o., female Today's Date: 01/04/2023   END OF SESSION:  PT End of Session - 01/04/23 1530     Visit Number 12    Date for PT Re-Evaluation 01/17/23    Authorization Type Medicare & BCBS    Progress Note Due on Visit 20    PT Start Time 1530    PT Stop Time 1612    PT Time Calculation (min) 42 min    Activity Tolerance Patient tolerated treatment well    Behavior During Therapy [facility] for tasks assessed/performed                    Past Medical History:  Diagnosis Date   Allergy    Anemia    past hx    Arthropathy of cervical spine 11/27/2012   Right  Xray at [facility]  On 04/08/2016  AP, lateral, and lateral flexion and extension views of the cervical spine are submitted for evaluation.   No acute fracture identified in the cervical spine.   There is redemonstration of approximately 2 mm of C3 on C4 anterolisthesis in neutral which slightly increases in flexion relative to neutral and extension. Slight C5 on C6 retrolisthesis does not change i   Benign hypertension without congestive heart failure    Benign mole    right hcets mole, bleeds when dried with towel   Bilateral dry eyes    Bladder cancer ([place]) 11-09-2016   Cervical spondylosis    Cervicalgia    2 screws in place    Depression with anxiety 2016-11-09   prior to husbands death    Dyspnea    Gross hematuria    developed after first hip replacment, led to indicental finding of bladder tumor, bleeding now resolved    H/O total shoulder replacement, left    History of kidney stones    1970's   Left hand weakness    due to reinjury of flxor tendon s/p tendon surgery march 12-2018   Lumbar stenosis    Malignant tumor of urinary bladder ([place]) 07/2016   Pre-stage I   OA  (osteoarthritis)    Parkinson disease dx 2012   neurologist-  dr siddiqui at [place] [facility])     Dr Trudie Reed , [place] [facility]    Rupture of flexor tendon of hand 2020   right   Past Surgical History:  Procedure Laterality Date   BIOPSY MASS LEFT FIRST METACARPAL   06/23/2006   benign   CATARACT EXTRACTION W/ INTRAOCULAR LENS  IMPLANT, BILATERAL  1999 and 2003   CERVICAL FUSION  02/2017   c1-c2 ; reports she has 2 screws 2in long in place done at [facility] ; patient exhibits VERY LIMITED NECK ROM   COLONOSCOPY     CYSTOSCOPY W/ RETROGRADES Bilateral 09/22/2016   Procedure: CYSTOSCOPY WITH RETROGRADE PYELOGRAM;  Surgeon: Alexis Frock, MD;  Location: [facility];  Service: Urology;  Laterality: Bilateral;   D & C HYSTEROSCOPY W/ RESECTION POLYP  07/11/2000   DILATION AND CURETTAGE OF UTERUS  1960's   EXCISION CYST AND DEBRIDEMENT RIGHT WIRST AND REMOVAL FORGEIGN BODY  01/09/2009   FLEXOR TENDON REPAIR Left 12/12/2018   Procedure: REPAIR/TRANSFER FLEXOR DIGITORUM PROFUNDUS OF LEFT SMALL FINGER;  Surgeon: Fredna Dow,  Dominica Severin, MD;  Location: [place];  Service: Orthopedics;  Laterality: Left;   LAPAROSCOPY  yrs ago   infertility    METACARPOPHALANGEAL JOINT ARTHRODESIS Right 05/25/1996   REVERSE SHOULDER ARTHROPLASTY Left 10/14/2022   Procedure: REVERSE SHOULDER ARTHROPLASTY;  Surgeon: Justice Britain, MD;  Location: [facility];  Service: Orthopedics;  Laterality: Left;  140mn   TENDON REPAIR Left 01/15/2019   Hand   TONSILLECTOMY AND ADENOIDECTOMY  child   TOTAL HIP ARTHROPLASTY Left 07/07/2016   Procedure: LEFT TOTAL HIP ARTHROPLASTY ANTERIOR APPROACH;  Surgeon: FGaynelle Arabian MD;  Location: [facility];  Service: Orthopedics;  Laterality: Left;   TOTAL HIP ARTHROPLASTY Right 09/28/2017   Procedure: RIGHT TOTAL HIP ARTHROPLASTY ANTERIOR APPROACH;  Surgeon: AGaynelle Arabian MD;  Location: [facility];  Service: Orthopedics;  Laterality: Right;   TOTAL KNEE  ARTHROPLASTY Right 07/30/2019   Procedure: TOTAL KNEE ARTHROPLASTY;  Surgeon: AGaynelle Arabian MD;  Location: [facility];  Service: Orthopedics;  Laterality: Right;  525m   TRANSURETHRAL RESECTION OF BLADDER TUMOR N/A 09/22/2016   Procedure: TRANSURETHRAL RESECTION OF BLADDER TUMOR (TURBT);  Surgeon: ThAlexis FrockMD;  [facility] Service[facility];  Laterality: N/A;   Patient Active Problem List   Diagnosis Date Noted   S/P reverse total shoulder arthroplasty, left 10/14/2022   Preoperative cardiovascular examination 10/04/2022   SOB (shortness of breath) 08/02/2022   Insomnia 08/02/2022   Hyponatremia 08/01/2022   Shoulder pain, left 09/14/2021   History of COVID-19 01/06/2021   Diarrhea 09/03/2020   Vitamin D deficiency 09/03/2020   OA (osteoarthritis) of knee 07/30/2019   Tinea corporis 06/08/2019   Psoriatic arthritis ([facility]05/18/2020   Anemia 03/13/2018   Hot flashes 03/13/2018   S/P cervical spinal fusion 03/31/2017   Neck pain 12/12/2016   Depression with anxiety 10/17/2016   Bladder cancer ([place]/01/2016   OA (osteoarthritis) of hip 07/07/2016   Spinal stenosis of lumbar region with radiculopathy 04/08/2016   Spondylolisthesis of lumbar region 04/08/2016   Spondylosis of cervical region without myelopathy or radiculopathy 04/08/2016   Preventative health care 01/18/2016   Low back pain 01/18/2016   History of chicken pox    Hyperlipidemia 12/15/2014   Allergic rhinitis 06/25/2014   Allergy to bee sting 06/10/2014   TMJ tenderness 11/16/2013   Other malaise and fatigue 05/07/2013   Arthropathy of cervical spine ([place]01/13/2014   Muscle spasms of neck 11/27/2012   Parkinson's disease 05/24/2012   Conjunctivochalasis 03/09/2012   Epiretinal membrane 03/09/2012   Hyperopia with astigmatism and presbyopia 03/09/2012   Pseudophakia 03/09/2012   HTN (hypertension) 08/31/2011   Right knee pain 08/31/2011   Benign hypertensive heart disease  without heart failure 05/12/2011    PCP: BlMosie LukesMD   REFERRING PROVIDER: SuJustice BritainMD   REFERRING DIAG: [id number]-10-CM) - Presence of left artificial shoulder joint   THERAPY DIAG:  Stiffness of left shoulder, not elsewhere classified  Chronic left shoulder pain  Abnormal posture  Muscle weakness (generalized)  RATIONALE FOR EVALUATION AND TREATMENT: Rehabilitation  ONSET DATE: 10/14/2022 - L reverse TSA  NEXT MD VISIT: 01/05/23   SUBJECTIVE:  SUBJECTIVE STATEMENT:  Pt reports she feels like she has regained at least 70% of her function in her L UE. She aloso states that she has been sleeping better. States she is trying to take her meloxicam qod so it will be less likely to upset her stomach.   PAIN: Are you having pain? Yes: NPRS scale:  5/10 Pain location: L scapula, thoracic spine & neck  Pain description: achy, throbbing  Aggravating factors: movement  Relieving factors: heat, medication   PERTINENT HISTORY:  L shoulder advanced OA - reverse TSA on 10/14/22; R TKA 07/2019, B THR 2018 & 2017, posterior C1-2 cervical fusion 2018, rupture of flexor tendon of L hand with failed surgical repair 2020, Parkinson's, psoriatic arthritis, bladder cancer, HTN    PRECAUTIONS: Shoulder  WEIGHT BEARING RESTRICTIONS: No  FALLS:  Has patient fallen in last 6 months? No  LIVING ENVIRONMENT: Lives with: lives alone Lives in: House/apartment Stairs: Yes: External: 4 steps; on right going up, on left going up, and can reach both Has following equipment at home: Single point cane, Walker - 2 wheeled, shower chair, bed side commode, and Grab bars  OCCUPATION: Retired  PLOF: Independent and Leisure: volunteering at [employer], bible study, walking 1/3 mile  daily  PATIENT GOALS: "To get rid of my pain and be able to do things with both hands."   OBJECTIVE:   DIAGNOSTIC FINDINGS:  09/14/21 - L shoulder x-ray: IMPRESSION: 1. No acute bony abnormality. 2. Mild/moderate degenerative changes.  PATIENT SURVEYS:  Quick Dash 65.9 / 100 = 65.9 % (eval); 45.5 / 100 = 45.5% (12/27/22)  COGNITION: Overall cognitive status: Within functional limits for tasks assessed     SENSATION: WFL  POSTURE: rounded shoulders, forward head, weight shift right, and scoliosis  UPPER EXTREMITY ROM:   Active ROM Right eval Left eval Left 12/13/22 Left 12/27/22 Left 01/04/23  Shoulder flexion 152 89 * 102 116 118  Shoulder extension       Shoulder abduction 144 55 * 79 87 95  Shoulder adduction       Shoulder internal rotation    67 70  Shoulder external rotation 71 45 * 61 69 69  Elbow flexion       Elbow extension       Wrist flexion       Wrist extension       Wrist ulnar deviation       Wrist radial deviation       Wrist pronation       Wrist supination        Passive ROM Left eval Left 12/27/22  Shoulder flexion 91 126  Shoulder extension    Shoulder abduction 64 99  Shoulder adduction    Shoulder internal rotation 31 at 30 ABD 67  Shoulder external rotation  69  Elbow flexion    Elbow extension    Wrist flexion    Wrist extension    Wrist ulnar deviation    Wrist radial deviation    Wrist pronation    Wrist supination    (Blank rows = not tested) * = pain  UPPER EXTREMITY MMT:  (deferred on eval)  MMT Right 12/27/22 Left 12/27/22  Shoulder flexion 5 4-  Shoulder extension    Shoulder abduction 5 4  Shoulder adduction    Shoulder internal rotation 5 4+  Shoulder external rotation 5 4  Middle trapezius    Lower trapezius    Elbow flexion    Elbow extension  Wrist flexion    Wrist extension    Wrist ulnar deviation    Wrist radial deviation    Wrist pronation    Wrist supination    Grip strength (lbs)    (Blank rows =  not tested)  PALPATION:  TTP with taut bands and TPs in L posterolateral shoulder and pecs    TODAY'S TREATMENT:   01/04/23 THERAPEUTIC EXERCISE: to improve flexibility, strength and mobility.  Verbal and tactile cues throughout for technique. Pulleys: L shoulder flexion & scaption x 3 min each S/L L shoulder ER + scap retraction x 5 with 1# db, x 15 with 4 oz. putty tub for added wt/resistance (1# felt too heavy) S/L L shoulder ABD + scap retraction 2 x 10 with 4 oz. putty tub for added wt/resistance S/L L shoulder flexion + scap retraction 2 x 10 with 4 oz. putty tub for added wt/resistance Hooklying L shoulder protraction 1# 2 x 10  Hooklying L shoulder CW/CCW circles at 90 flexion 1# x 10 each direction  THERAPEUTIC ACTIVITIES: ROM assessment  MANUAL THERAPY: To promote normalized muscle tension, improved flexibility, improved joint mobility, increased ROM, and reduced pain.  STM/TPR to L pecs, teres group, infraspinatus, LS, and rhomboids   12/30/22 THERAPEUTIC EXERCISE: to improve flexibility, strength and mobility.  Verbal and tactile cues throughout for technique. Pulleys: L shoulder flexion & scaption x 3 min each Supine Chin Tucks 2x10 5" hold  Supine upper-cut shoulder flexion + scap retraction 2 x 10 Supine hitch-hiker shoulder ABD + scap retraction  x 10 L shoulder reactive IR isometric step-outs with YTB x 10 L shoulder reactive ER isometric step-outs with YTB x 10 L shoulder reactive Flx Isometric step-outs with YTB x 10 L shoulder reactive Ext Isometric step-outs with YTB x 10 L shoulder reactive Abd Isometric step-outs with YTB x 10 L shoulder flexion cabinet reaches to 1st shelf  lifting 4 oz.Theraputty tub 2 x 10 (modified with velcro strap to make gripping tub easier due limited grip from pre-existing tendon injury and psoriatic arthritis) L shoulder scaption cabinet reaches to 1st shelf x 10, lifting 4 oz.Theraputty tub x 10 (modified with velcro strap to make  gripping tub easier)  MANUAL THERAPY: To promote normalized muscle tension, improved flexibility, improved joint mobility, increased ROM, and reduced pain. STM to L Traps & LS   MODALITIES: IFC to L UT & Infraspinatus , 80-150 Hz, intensity to patient tolerance x 15 min  Moist heat to L lateral neck and periscapular area x 15 min     12/27/22 THERAPEUTIC EXERCISE: to improve flexibility, strength and mobility.  Verbal and tactile cues throughout for technique.  Pulleys: L shoulder flexion & scaption x 3 min each L shoulder reactive IR isometric step-outs with YTB x 10 L shoulder reactive ER isometric step-outs with YTB x 10 L shoulder flexion cabinet reaches to 1st shelf x 5, lifting 4 oz.Theraputty tub x 10 (modified with velcro strap to make gripping tub easier due limited grip from pre-existing tendon injury and psoriatic arthritis) L shoulder scaption cabinet reaches to 1st shelf x 10, lifting 4 oz.Theraputty tub x 10 (modified with velcro strap to make gripping tub easier)  THERAPEUTIC ACTIVITIES: QuickDASH: 45.5 / 100 = 45.5% ROM & MMT assessment   PATIENT EDUCATION:  Education details: progress with PT and ongoing PT POC  Person educated: Patient Education method: Explanation Education comprehension: verbalized understanding   HOME EXERCISE PROGRAM: Access Code: [email or web address] URL: [email or web address] Date: 11/29/2022 Prepared by: Annie Paras  Exercises - Supine Shoulder Flexion AAROM with Dowel  - 2 x daily - 7 x weekly - 2 sets - 10 reps - 3 sec hold - Seated Shoulder Abduction AAROM with Dowel  - 2 x daily - 7 x weekly - 2 sets - 10 reps - 3 sec hold - Seated Shoulder External Rotation AAROM with Cane and Hand in Neutral (Mirrored)  - 2 x daily - 7 x weekly - 2 sets - 10 reps - 3 sec hold - Isometric Shoulder External Rotation  - 1 x daily - 7 x weekly - 2 sets - 10 reps - 5 sec hold - Isometric Shoulder Abduction  - 1 x daily - 7 x weekly - 2 sets - 10  reps - 3 sec hold   ASSESSMENT:  CLINICAL IMPRESSION:  Emileigh reports improving sleep with decreased sleep disturbance due to pain, although still experiencing intermittent neck and periscapular pain with recurrent trigger points.  Recent manual therapy including STM and manual trigger point release appears to be contributing to her improvement in pain and ability to sleep, as well as improving her tolerance for exercise and functional UE use.  She reports 70% improvement in functional use of her L UE, noting increased ease with dressing and better ability to reach things in her cupboard.  Her L shoulder AROM continues to gradually improve with greatest gains recently in abduction - STG #2 now met.  Good tolerance for progression of shoulder strengthening as well as scapular stabilization exercises today without increased pain.  [place] will continue to benefit from skilled PT to address above deficits to improve mobility and activity tolerance with decreased pain interference.   OBJECTIVE IMPAIRMENTS: decreased activity tolerance, decreased mobility, decreased ROM, decreased strength, increased fascial restrictions, impaired perceived functional ability, increased muscle spasms, impaired flexibility, impaired UE functional use, improper body mechanics, postural dysfunction, and pain.   ACTIVITY LIMITATIONS: carrying, lifting, bed mobility, bathing, dressing, reach over head, and hygiene/grooming  PARTICIPATION LIMITATIONS: meal prep, cleaning, laundry, driving, shopping, community activity, and church  PERSONAL FACTORS: Age, Fitness, Past/current experiences, Time since onset of injury/illness/exacerbation, and 3+ comorbidities: R TKA 07/2019, B THR 2018 & 2017, posterior C1-2 cervical fusion 2018, rupture of flexor tendon of L hand with failed surgical repair 2020, Parkinson's, psoriatic arthritis, bladder cancer, HTN  are also affecting patient's functional outcome.   REHAB POTENTIAL:  Good  CLINICAL DECISION MAKING: Evolving/moderate complexity  EVALUATION COMPLEXITY: Low   GOALS: Goals reviewed with patient? Yes  SHORT TERM GOALS: Target date: 12/20/2022  Patient will be independent with initial HEP to improve outcomes and carryover.  Baseline:  Goal status: MET  12/13/22  2.  Patient will improve L shoulder P/AAROM to >/= 115 flexion, 90 abduction and 45 ER w/o increased pain to promote more functional L UE use.  Baseline:  Goal status: MET  01/04/23   LONG TERM GOALS: Target date: 01/17/2023  Patient will be independent with ongoing/advanced HEP for self-management at home.  Baseline:  Goal status: IN PROGRESS  01/04/23 - Met for current HEP  2.  Patient will report 50-75% improvement in L shoulder pain to improve QOL.  Baseline:  Goal status: MET  12/27/22 - 90% improvement  3.  Patient to demonstrate improved upright posture with posterior shoulder girdle engaged to promote improved glenohumeral joint mobility. Baseline:  Goal status: IN PROGRESS  12/27/22 - Good awareness of desired neutral neck and shoulder posture but continued fwd head with R shift  4.  Patient  to improve L shoulder AROM to [facility] without pain provocation to allow for increased ease of ADLs.  Baseline:  Goal status: IN PROGRESS  01/04/23 - L shoulder ROM progressing in all planes - patient noting increased ease of upper body dressing as well as ability to reach into overhead cabinets  5.  Patient will demonstrate improved L shoulder strength to >/= 4/5 for functional UE use. Baseline:  Goal status: IN PROGRESS  12/27/22 - Met except flexion 4-/5  6  Patient will report </= 55% on QuickDASH to demonstrate improved functional ability.  Baseline: 65.9 / 100 = 65.9% Goal status: MET  12/27/22 - QuickDASH: 45.5 / 100 = 45.5%   PLAN:  PT FREQUENCY: 2x/week  PT DURATION: 8 weeks  PLANNED INTERVENTIONS: Therapeutic exercises, Therapeutic activity, Neuromuscular re-education, Balance  training, Gait training, Patient/Family education, Self Care, Joint mobilization, Dry Needling, Electrical stimulation, Cryotherapy, Moist heat, scar mobilization, Taping, Vasopneumatic device, Ionotophoresis 71m/ml Dexamethasone, Manual therapy, and Re-evaluation  PLAN FOR NEXT SESSION: L shoulder P/AA/AROM per protocol; progress L shoulder and scapular strengthening as tolerated, MT and modalities as needed/tolerated to address pain and abnormal muscle tension     JPercival Spanish PT 01/04/2023, 5:00 PM

## 2023-01-05 DIAGNOSIS — Z96612 Presence of left artificial shoulder joint: Secondary | ICD-10-CM | POA: Diagnosis not present

## 2023-01-05 DIAGNOSIS — Z471 Aftercare following joint replacement surgery: Secondary | ICD-10-CM | POA: Diagnosis not present

## 2023-01-06 ENCOUNTER — Encounter: Payer: Self-pay | Admitting: Physical Therapy

## 2023-01-06 ENCOUNTER — Ambulatory Visit: Payer: Medicare Other | Admitting: Physical Therapy

## 2023-01-06 DIAGNOSIS — G8929 Other chronic pain: Secondary | ICD-10-CM | POA: Diagnosis not present

## 2023-01-06 DIAGNOSIS — M25612 Stiffness of left shoulder, not elsewhere classified: Secondary | ICD-10-CM | POA: Diagnosis not present

## 2023-01-06 DIAGNOSIS — M6281 Muscle weakness (generalized): Secondary | ICD-10-CM

## 2023-01-06 DIAGNOSIS — R293 Abnormal posture: Secondary | ICD-10-CM

## 2023-01-06 DIAGNOSIS — R2689 Other abnormalities of gait and mobility: Secondary | ICD-10-CM | POA: Diagnosis not present

## 2023-01-06 DIAGNOSIS — M25512 Pain in left shoulder: Secondary | ICD-10-CM | POA: Diagnosis not present

## 2023-01-06 NOTE — Therapy (Signed)
OUTPATIENT PHYSICAL THERAPY TREATMENT    Patient Name: Stephanie [name] MRN: [id number] DOB:03/27/1932, 89 y.o., female Today's Date: 01/06/2023   END OF SESSION:  PT End of Session - 01/06/23 1605     Visit Number 13    Date for PT Re-Evaluation 01/17/23    Authorization Type Medicare & BCBS    Progress Note Due on Visit 20    PT Start Time 1614    PT Stop Time 1701    PT Time Calculation (min) 47 min    Activity Tolerance Patient tolerated treatment well    Behavior During Therapy [facility] for tasks assessed/performed                    Past Medical History:  Diagnosis Date   Allergy    Anemia    past hx    Arthropathy of cervical spine 11/27/2012   Right  Xray at [facility]  On 04/08/2016  AP, lateral, and lateral flexion and extension views of the cervical spine are submitted for evaluation.   No acute fracture identified in the cervical spine.   There is redemonstration of approximately 2 mm of C3 on C4 anterolisthesis in neutral which slightly increases in flexion relative to neutral and extension. Slight C5 on C6 retrolisthesis does not change i   Benign hypertension without congestive heart failure    Benign mole    right hcets mole, bleeds when dried with towel   Bilateral dry eyes    Bladder cancer ([place]) October 18, 2016   Cervical spondylosis    Cervicalgia    2 screws in place    Depression with anxiety 2016/10/18   prior to husbands death    Dyspnea    Gross hematuria    developed after first hip replacment, led to indicental finding of bladder tumor, bleeding now resolved    H/O total shoulder replacement, left    History of kidney stones    1970's   Left hand weakness    due to reinjury of flxor tendon s/p tendon surgery march 12-2018   Lumbar stenosis    Malignant tumor of urinary bladder ([place]) 07/2016   Pre-stage I   OA (osteoarthritis)    Parkinson disease dx 2012   neurologist-  dr siddiqui at [place] [facility])     Dr Trudie Reed , [place]  rheum    Rupture of flexor tendon of hand 2020   right   Past Surgical History:  Procedure Laterality Date   BIOPSY MASS LEFT FIRST METACARPAL   06/23/2006   benign   CATARACT EXTRACTION W/ INTRAOCULAR LENS  IMPLANT, BILATERAL  1999 and 2003   CERVICAL FUSION  02/2017   c1-c2 ; reports she has 2 screws 2in long in place done at [facility] ; patient exhibits VERY LIMITED NECK ROM   COLONOSCOPY     CYSTOSCOPY W/ RETROGRADES Bilateral 09/22/2016   Procedure: CYSTOSCOPY WITH RETROGRADE PYELOGRAM;  Surgeon: Alexis Frock, MD;  Location: [facility];  Service: Urology;  Laterality: Bilateral;   D & C HYSTEROSCOPY W/ RESECTION POLYP  07/11/2000   DILATION AND CURETTAGE OF UTERUS  1960's   EXCISION CYST AND DEBRIDEMENT RIGHT WIRST AND REMOVAL FORGEIGN BODY  01/09/2009   FLEXOR TENDON REPAIR Left 12/12/2018   Procedure: REPAIR/TRANSFER FLEXOR DIGITORUM PROFUNDUS OF LEFT SMALL FINGER;  Surgeon: Daryll Brod, MD;  Location: [place];  Service: Orthopedics;  Laterality: Left;   LAPAROSCOPY  yrs ago   infertility  METACARPOPHALANGEAL JOINT ARTHRODESIS Right 05/25/1996   REVERSE SHOULDER ARTHROPLASTY Left 10/14/2022   Procedure: REVERSE SHOULDER ARTHROPLASTY;  Surgeon: Justice Britain, MD;  Location: [facility];  Service: Orthopedics;  Laterality: Left;  169mn   TENDON REPAIR Left 01/15/2019   Hand   TONSILLECTOMY AND ADENOIDECTOMY  child   TOTAL HIP ARTHROPLASTY Left 07/07/2016   Procedure: LEFT TOTAL HIP ARTHROPLASTY ANTERIOR APPROACH;  Surgeon: FGaynelle Arabian MD;  Location: [facility];  Service: Orthopedics;  Laterality: Left;   TOTAL HIP ARTHROPLASTY Right 09/28/2017   Procedure: RIGHT TOTAL HIP ARTHROPLASTY ANTERIOR APPROACH;  Surgeon: AGaynelle Arabian MD;  Location: [facility];  Service: Orthopedics;  Laterality: Right;   TOTAL KNEE ARTHROPLASTY Right 07/30/2019   Procedure: TOTAL KNEE ARTHROPLASTY;  Surgeon: AGaynelle Arabian MD;  Location: [facility];  Service: Orthopedics;   Laterality: Right;  513m   TRANSURETHRAL RESECTION OF BLADDER TUMOR N/A 09/22/2016   Procedure: TRANSURETHRAL RESECTION OF BLADDER TUMOR (TURBT);  Surgeon: ThAlexis FrockMD;  [facility] Service: [facility]rology;  Laterality: N/A;   Patient Active Problem List   Diagnosis Date Noted   S/P reverse total shoulder arthroplasty, left 10/14/2022   Preoperative cardiovascular examination 10/04/2022   SOB (shortness of breath) 08/02/2022   Insomnia 08/02/2022   Hyponatremia 08/01/2022   Shoulder pain, left 09/14/2021   History of COVID-19 01/06/2021   Diarrhea 09/03/2020   Vitamin D deficiency 09/03/2020   OA (osteoarthritis) of knee 07/30/2019   Tinea corporis 06/08/2019   [place]oriatic [place] ([place]18[place]   Anemia 03/13/2018   Hot flashes 03/13/2018   S/P cervical spinal fusion 03/31/2017   Neck pain 12/12/2016   Depression with anxiety 10/17/2016   [place]   OA (osteoarthritis) of hip 07/07/2016   Spinal stenosis of lumbar region with radiculopathy 04/08/2016   Spondylolisthesis of lumbar region 04/08/2016   Spondylosis of cervical region without myelopathy or radiculopathy 04/08/2016   Preventative health care 01/18/2016   Low back pain 01/18/2016   History of chicken pox    Hyperlipidemia 12/15/2014   Allergic rhinitis 06/25/2014   Allergy to bee sting 06/10/2014   TMJ tenderness 11/16/2013   Other malaise and fatigue 05/07/2013   Arthropathy of cervical spine ([place]/13/2014   Muscle spasms of neck 11/27/2012   Parkinson's disease 05/24/2012   Conjunctivochalasis 03/09/2012   Epiretinal membrane 03/09/2012   Hyperopia with astigmatism and presbyopia 03/09/2012   Pseudophakia 03/09/2012   HTN (hypertension) 08/31/2011   Right knee pain 08/31/2011   Benign hypertensive heart disease without heart failure 05/12/2011    PCP: BlMosie LukesMD   REFERRING PROVIDER: SuJustice BritainMD   REFERRING DIAG: [id number]-10-CM)  - Presence of left artificial shoulder joint   THERAPY DIAG:  Stiffness of left shoulder, not elsewhere classified  Chronic left shoulder pain  Abnormal posture  Muscle weakness (generalized)  RATIONALE FOR EVALUATION AND TREATMENT: Rehabilitation  ONSET DATE: 10/14/2022 - L reverse TSA  NEXT MD VISIT: 1 yr from 01/05/23    SUBJECTIVE:  SUBJECTIVE STATEMENT:  Kaitley reports Dr. Onnie Graham was pleased with her progress and does not need to see her again for a another year. She states she inquired about her neck pain and she as told that she would need to f/u with her neurosurgeon. She notes she had pain earlier today but rested and put some "Maxi-freeze" on it and the pain has subsided.  PAIN: Are you having pain? No  PERTINENT HISTORY:  L shoulder advanced OA - reverse TSA on 10/14/22; R TKA 07/2019, B THR 2018 & 2017, posterior C1-2 cervical fusion 2018, rupture of flexor tendon of L hand with failed surgical repair 2020, Parkinson's, psoriatic arthritis, bladder cancer, HTN    PRECAUTIONS: Shoulder  WEIGHT BEARING RESTRICTIONS: No  FALLS:  Has patient fallen in last 6 months? No  LIVING ENVIRONMENT: Lives with: lives alone Lives in: House/apartment Stairs: Yes: External: 4 steps; on right going up, on left going up, and can reach both Has following equipment at home: Single point cane, Walker - 2 wheeled, shower chair, bed side commode, and Grab bars  OCCUPATION: Retired  PLOF: Independent and Leisure: volunteering at [employer], bible study, walking 1/3 mile daily  PATIENT GOALS: "To get rid of my pain and be able to do things with both hands."   OBJECTIVE:   DIAGNOSTIC FINDINGS:  09/14/21 - L shoulder x-ray: IMPRESSION: 1. No acute bony abnormality. 2. Mild/moderate  degenerative changes.  PATIENT SURVEYS:  Quick Dash 65.9 / 100 = 65.9 % (eval); 45.5 / 100 = 45.5% (12/27/22)  COGNITION: Overall cognitive status: Within functional limits for tasks assessed     SENSATION: WFL  POSTURE: rounded shoulders, forward head, weight shift right, and scoliosis  UPPER EXTREMITY ROM:   Active ROM Right eval Left eval Left 12/13/22 Left 12/27/22 Left 01/04/23  Shoulder flexion 152 89 * 102 116 118  Shoulder extension       Shoulder abduction 144 55 * 79 87 95  Shoulder adduction       Shoulder internal rotation    67 70  Shoulder external rotation 71 45 * 61 69 69  Elbow flexion       Elbow extension       Wrist flexion       Wrist extension       Wrist ulnar deviation       Wrist radial deviation       Wrist pronation       Wrist supination        Passive ROM Left eval Left 12/27/22  Shoulder flexion 91 126  Shoulder extension    Shoulder abduction 64 99  Shoulder adduction    Shoulder internal rotation 31 at 30 ABD 67  Shoulder external rotation  69  Elbow flexion    Elbow extension    Wrist flexion    Wrist extension    Wrist ulnar deviation    Wrist radial deviation    Wrist pronation    Wrist supination    (Blank rows = not tested) * = pain  UPPER EXTREMITY MMT:  (deferred on eval)  MMT Right 12/27/22 Left 12/27/22  Shoulder flexion 5 4-  Shoulder extension    Shoulder abduction 5 4  Shoulder adduction    Shoulder internal rotation 5 4+  Shoulder external rotation 5 4  Middle trapezius    Lower trapezius    Elbow flexion    Elbow extension    Wrist flexion    Wrist extension    Wrist ulnar  deviation    Wrist radial deviation    Wrist pronation    Wrist supination    Grip strength (lbs)    (Blank rows = not tested)  PALPATION:  TTP with taut bands and TPs in L posterolateral shoulder and pecs    TODAY'S TREATMENT:   01/06/23 THERAPEUTIC EXERCISE: to improve flexibility, strength and mobility.  Verbal and tactile  cues throughout for technique. Pulleys: L shoulder flexion & scaption x 3 min each S/L L shoulder ER + scap retraction 2 x 10 with 4+3 oz. putty tubs held together with velcro strap for added wt/resistance S/L L shoulder ABD + scap retraction 2 x 10 with 4+3 oz. putty tubs held together with velcro strap for added wt/resistance S/L L shoulder 4+3 oz. putty tubs held together with velcro strap S/L L shoulder flexion + scap retraction x 10 with 4+3 oz. putty tubs held together with velcro strap for added wt/resistance, 2nd set w/o added weight d/t c/o increased pain in l UT with weights Hooklying L shoulder protraction 1# 2 x 10  Hooklying L shoulder CW/CCW circles at 90 flexion 1# x 10 each direction Seated RTB B shoulder rows/retractions x 10 Seated RTB retraction + shoulder extension to neutral x 10 Standing YTB L shoulder IR x 10 Standing YTB L shoulder ER x 10  MANUAL THERAPY: To promote normalized muscle tension, improved flexibility, improved joint mobility, and reduced pain.  STM/TPR to L rhomboids, cervicothoracic paraspinals, LS and UT   01/04/23 THERAPEUTIC EXERCISE: to improve flexibility, strength and mobility.  Verbal and tactile cues throughout for technique. Pulleys: L shoulder flexion & scaption x 3 min each S/L L shoulder ER + scap retraction x 5 with 1# db, x 15 with 4 oz. putty tub for added wt/resistance (1# felt too heavy) S/L L shoulder ABD + scap retraction 2 x 10 with 4 oz. putty tub for added wt/resistance S/L L shoulder flexion + scap retraction 2 x 10 with 4 oz. putty tub for added wt/resistance Hooklying L shoulder protraction 1# 2 x 10  Hooklying L shoulder CW/CCW circles at 90 flexion 1# x 10 each direction  THERAPEUTIC ACTIVITIES: ROM assessment  MANUAL THERAPY: To promote normalized muscle tension, improved flexibility, improved joint mobility, increased ROM, and reduced pain.  STM/TPR to L pecs, teres group, infraspinatus, LS, and  rhomboids   12/30/22 THERAPEUTIC EXERCISE: to improve flexibility, strength and mobility.  Verbal and tactile cues throughout for technique. Pulleys: L shoulder flexion & scaption x 3 min each Supine Chin Tucks 2x10 5" hold  Supine upper-cut shoulder flexion + scap retraction 2 x 10 Supine hitch-hiker shoulder ABD + scap retraction  x 10 L shoulder reactive IR isometric step-outs with YTB x 10 L shoulder reactive ER isometric step-outs with YTB x 10 L shoulder reactive Flx Isometric step-outs with YTB x 10 L shoulder reactive Ext Isometric step-outs with YTB x 10 L shoulder reactive Abd Isometric step-outs with YTB x 10 L shoulder flexion cabinet reaches to 1st shelf  lifting 4 oz.Theraputty tub 2 x 10 (modified with velcro strap to make gripping tub easier due limited grip from pre-existing tendon injury and psoriatic arthritis) L shoulder scaption cabinet reaches to 1st shelf x 10, lifting 4 oz.Theraputty tub x 10 (modified with velcro strap to make gripping tub easier)  MANUAL THERAPY: To promote normalized muscle tension, improved flexibility, improved joint mobility, increased ROM, and reduced pain. STM to L Traps & LS   MODALITIES: IFC to L UT &  Infraspinatus , 80-150 Hz, intensity to patient tolerance x 15 min  Moist heat to L lateral neck and periscapular area x 15 min      PATIENT EDUCATION:  Education details: progress with PT and ongoing PT POC  Person educated: Patient Education method: Explanation Education comprehension: verbalized understanding   HOME EXERCISE PROGRAM: Access Code: [email or web address] URL: [email or web address] Date: 11/29/2022 Prepared by: Annie Paras  Exercises - Supine Shoulder Flexion AAROM with Dowel  - 2 x daily - 7 x weekly - 2 sets - 10 reps - 3 sec hold - Seated Shoulder Abduction AAROM with Dowel  - 2 x daily - 7 x weekly - 2 sets - 10 reps - 3 sec hold - Seated Shoulder External Rotation AAROM with Cane and Hand in Neutral  (Mirrored)  - 2 x daily - 7 x weekly - 2 sets - 10 reps - 3 sec hold - Isometric Shoulder External Rotation  - 1 x daily - 7 x weekly - 2 sets - 10 reps - 5 sec hold - Isometric Shoulder Abduction  - 1 x daily - 7 x weekly - 2 sets - 10 reps - 3 sec hold   ASSESSMENT:  CLINICAL IMPRESSION:  Emmari reports her surgeon is pleased with her progress s/p L reverse TSA and has released her to f/u in 1 year. She continues to tolerate progression of L shoulder ROM and strengthening but still with intermittent limitation due to periscapular and neck pain and TPs. We have been able to achieve some relief with MT allowing for her to continue to progress exercises as well as improve her sleeping tolerance, but this continues to be her main complaint. Dr. Onnie Graham recommended she f/u with her neurosurgeon in regards to her neck. With this in mind, will plan to review and update/consolidate her HEP over her remaining visits in prep for transition to HEP at the end of her current POC, and will readdress neck and periscapular pain as indicated and orders received once she is able to f/u with her neurosurgeon.   OBJECTIVE IMPAIRMENTS: decreased activity tolerance, decreased mobility, decreased ROM, decreased strength, increased fascial restrictions, impaired perceived functional ability, increased muscle spasms, impaired flexibility, impaired UE functional use, improper body mechanics, postural dysfunction, and pain.   ACTIVITY LIMITATIONS: carrying, lifting, bed mobility, bathing, dressing, reach over head, and hygiene/grooming  PARTICIPATION LIMITATIONS: meal prep, cleaning, laundry, driving, shopping, community activity, and church  PERSONAL FACTORS: Age, Fitness, Past/current experiences, Time since onset of injury/illness/exacerbation, and 3+ comorbidities: R TKA 07/2019, B THR 2018 & 2017, posterior C1-2 cervical fusion 2018, rupture of flexor tendon of L hand with failed surgical repair 2020, Parkinson's,  psoriatic arthritis, bladder cancer, HTN  are also affecting patient's functional outcome.   REHAB POTENTIAL: Good  CLINICAL DECISION MAKING: Evolving/moderate complexity  EVALUATION COMPLEXITY: Low   GOALS: Goals reviewed with patient? Yes  SHORT TERM GOALS: Target date: 12/20/2022  Patient will be independent with initial HEP to improve outcomes and carryover.  Baseline:  Goal status: MET  12/13/22  2.  Patient will improve L shoulder P/AAROM to >/= 115 flexion, 90 abduction and 45 ER w/o increased pain to promote more functional L UE use.  Baseline:  Goal status: MET  01/04/23   LONG TERM GOALS: Target date: 01/17/2023  Patient will be independent with ongoing/advanced HEP for self-management at home.  Baseline:  Goal status: IN PROGRESS  01/04/23 - Met for current HEP  2.  Patient will report 50-75% improvement in L  shoulder pain to improve QOL.  Baseline:  Goal status: MET  12/27/22 - 90% improvement  3.  Patient to demonstrate improved upright posture with posterior shoulder girdle engaged to promote improved glenohumeral joint mobility. Baseline:  Goal status: IN PROGRESS  12/27/22 - Good awareness of desired neutral neck and shoulder posture but continued fwd head with R shift  4.  Patient to improve L shoulder AROM to [facility] without pain provocation to allow for increased ease of ADLs.  Baseline:  Goal status: IN PROGRESS  01/04/23 - L shoulder ROM progressing in all planes - patient noting increased ease of upper body dressing as well as ability to reach into overhead cabinets  5.  Patient will demonstrate improved L shoulder strength to >/= 4/5 for functional UE use. Baseline:  Goal status: IN PROGRESS  12/27/22 - Met except flexion 4-/5  6  Patient will report </= 55% on QuickDASH to demonstrate improved functional ability.  Baseline: 65.9 / 100 = 65.9% Goal status: MET  12/27/22 - QuickDASH: 45.5 / 100 = 45.5%   PLAN:  PT FREQUENCY: 2x/week  PT DURATION: 8  weeks  PLANNED INTERVENTIONS: Therapeutic exercises, Therapeutic activity, Neuromuscular re-education, Balance training, Gait training, Patient/Family education, Self Care, Joint mobilization, Dry Needling, Electrical stimulation, Cryotherapy, Moist heat, scar mobilization, Taping, Vasopneumatic device, Ionotophoresis 5m/ml Dexamethasone, Manual therapy, and Re-evaluation  PLAN FOR NEXT SESSION: HEP review & update/consolidation - L shoulder AROM,  L shoulder and scapular strengthening; MT and modalities as needed/tolerated to address pain and abnormal muscle tension     JPercival Spanish PT 01/06/2023, 5:55 PM

## 2023-01-11 ENCOUNTER — Ambulatory Visit: Payer: Medicare Other | Admitting: Physical Therapy

## 2023-01-11 ENCOUNTER — Encounter: Payer: Self-pay | Admitting: Physical Therapy

## 2023-01-11 DIAGNOSIS — M25612 Stiffness of left shoulder, not elsewhere classified: Secondary | ICD-10-CM | POA: Diagnosis not present

## 2023-01-11 DIAGNOSIS — G8929 Other chronic pain: Secondary | ICD-10-CM | POA: Diagnosis not present

## 2023-01-11 DIAGNOSIS — M6281 Muscle weakness (generalized): Secondary | ICD-10-CM

## 2023-01-11 DIAGNOSIS — R293 Abnormal posture: Secondary | ICD-10-CM | POA: Diagnosis not present

## 2023-01-11 DIAGNOSIS — R2689 Other abnormalities of gait and mobility: Secondary | ICD-10-CM | POA: Diagnosis not present

## 2023-01-11 DIAGNOSIS — M25512 Pain in left shoulder: Secondary | ICD-10-CM | POA: Diagnosis not present

## 2023-01-11 NOTE — Therapy (Signed)
OUTPATIENT PHYSICAL THERAPY TREATMENT / DISCHARGE SUMMARY    Patient Name: Stephanie [name] MRN: [id number] DOB:1932/02/02, 89 y.o., female Today's Date: 01/11/2023   END OF SESSION:  PT End of Session - 01/11/23 1440     Visit Number 14    Date for PT Re-Evaluation 01/17/23    Authorization Type Medicare & BCBS    Progress Note Due on Visit 20    PT Start Time 1440    PT Stop Time 1522    PT Time Calculation (min) 42 min    Activity Tolerance Patient tolerated treatment well    Behavior During Therapy WFL for tasks assessed/performed                    Past Medical History:  Diagnosis Date   Allergy    Anemia    past hx    Arthropathy of cervical spine 11/27/2012   Right  Xray at [facility]  On 04/08/2016  AP, lateral, and lateral flexion and extension views of the cervical spine are submitted for evaluation.   No acute fracture identified in the cervical spine.   There is redemonstration of approximately 2 mm of C3 on C4 anterolisthesis in neutral which slightly increases in flexion relative to neutral and extension. Slight C5 on C6 retrolisthesis does not change i   Benign hypertension without congestive heart failure    Benign mole    right hcets mole, bleeds when dried with towel   Bilateral dry eyes    Bladder cancer ([place]) 04-Nov-2016   Cervical spondylosis    Cervicalgia    2 screws in place    Depression with anxiety 11-04-2016   prior to husbands death    Dyspnea    Gross hematuria    developed after first hip replacment, led to indicental finding of bladder tumor, bleeding now resolved    H/O total shoulder replacement, left    History of kidney stones    1970's   Left hand weakness    due to reinjury of flxor tendon s/p tendon surgery march 12-2018   Lumbar stenosis    Malignant tumor of urinary bladder ([place]) 07/2016   Pre-stage I   OA (osteoarthritis)    Parkinson disease dx 2012   neurologist-  dr siddiqui at [place] [facility])      Dr Trudie Reed , [place] [facility]    Rupture of flexor tendon of hand 2020   right   Past Surgical History:  Procedure Laterality Date   BIOPSY MASS LEFT FIRST METACARPAL   06/23/2006   benign   CATARACT EXTRACTION W/ INTRAOCULAR LENS  IMPLANT, BILATERAL  1999 and 2003   CERVICAL FUSION  02/2017   c1-c2 ; reports she has 2 screws 2in long in place done at [facility] ; patient exhibits VERY LIMITED NECK ROM   COLONOSCOPY     CYSTOSCOPY W/ RETROGRADES Bilateral 09/22/2016   Procedure: CYSTOSCOPY WITH RETROGRADE PYELOGRAM;  Surgeon: Alexis Frock, MD;  Location: [facility];  Service: Urology;  Laterality: Bilateral;   D & C HYSTEROSCOPY W/ RESECTION POLYP  07/11/2000   DILATION AND CURETTAGE OF UTERUS  1960's   EXCISION CYST AND DEBRIDEMENT RIGHT WIRST AND REMOVAL FORGEIGN BODY  01/09/2009   FLEXOR TENDON REPAIR Left 12/12/2018   Procedure: REPAIR/TRANSFER FLEXOR DIGITORUM PROFUNDUS OF LEFT SMALL FINGER;  Surgeon: Daryll Brod, MD;  Location: [place];  Service: Orthopedics;  Laterality: Left;   LAPAROSCOPY  yrs ago  infertility    METACARPOPHALANGEAL JOINT ARTHRODESIS Right 05/25/1996   REVERSE SHOULDER ARTHROPLASTY Left 10/14/2022   Procedure: REVERSE SHOULDER ARTHROPLASTY;  Surgeon: Justice Britain, MD;  Location: [facility];  Service: Orthopedics;  Laterality: Left;  153mn   TENDON REPAIR Left 01/15/2019   Hand   TONSILLECTOMY AND ADENOIDECTOMY  child   TOTAL HIP ARTHROPLASTY Left 07/07/2016   Procedure: LEFT TOTAL HIP ARTHROPLASTY ANTERIOR APPROACH;  Surgeon: FGaynelle Arabian MD;  Location: [facility];  Service: Orthopedics;  Laterality: Left;   TOTAL HIP ARTHROPLASTY Right 09/28/2017   Procedure: RIGHT TOTAL HIP ARTHROPLASTY ANTERIOR APPROACH;  Surgeon: AGaynelle Arabian MD;  Location: [facility];  Service: Orthopedics;  Laterality: Right;   TOTAL KNEE ARTHROPLASTY Right 07/30/2019   Procedure: TOTAL KNEE ARTHROPLASTY;  Surgeon: AGaynelle Arabian MD;  Location: [facility];   Service: Orthopedics;  Laterality: Right;  561m   TRANSURETHRAL RESECTION OF BLADDER TUMOR N/A 09/22/2016   Procedure: TRANSURETHRAL RESECTION OF BLADDER TUMOR (TURBT);  Surgeon: ThAlexis FrockMD;  [facility] Service: [facility];  Laterality: N/A;   Patient Active Problem List   Diagnosis Date Noted   S/P reverse total shoulder arthroplasty, left 10/14/2022   Preoperative cardiovascular examination 10/04/2022   SOB (shortness of breath) 08/02/2022   Insomnia 08/02/2022   Hyponatremia 08/01/2022   Shoulder pain, left 09/14/2021   History of COVID-19 01/06/2021   Diarrhea 09/03/2020   Vitamin D deficiency 09/03/2020   OA (osteoarthritis) of knee 07/30/2019   Tinea corporis 06/08/2019   Psoriatic arthritis ([place]/18/2020   Anemia 03/13/2018   Hot flashes 03/13/2018   S/P cervical spinal fusion 03/31/2017   Neck pain 12/12/2016   Depression with anxiety 10/17/2016   Bladder cancer ([facility]/01/2016   OA (osteoarthritis) of hip 07/07/2016   Spinal stenosis of lumbar region with radiculopathy 04/08/2016   Spondylolisthesis of lumbar region 04/08/2016   Spondylosis of cervical region without myelopathy or radiculopathy 04/08/2016   Preventative health care 01/18/2016   Low back pain 01/18/2016   History of chicken pox    Hyperlipidemia 12/15/2014   Allergic rhinitis 06/25/2014   Allergy to bee sting 06/10/2014   TMJ tenderness 11/16/2013   Other malaise and fatigue 05/07/2013   Arthropathy of cervical spine ([facility]/13/2014   Muscle spasms of neck 11/27/2012   Parkinson's disease 05/24/2012   Conjunctivochalasis 03/09/2012   Epiretinal membrane 03/09/2012   Hyperopia with astigmatism and presbyopia 03/09/2012   Pseudophakia 03/09/2012   HTN (hypertension) 08/31/2011   Right knee pain 08/31/2011   Benign hypertensive heart disease without heart failure 05/12/2011    PCP: BlMosie LukesMD   REFERRING PROVIDER: SuJustice BritainMD   REFERRING  DIAG: [id number]-10-CM) - Presence of left artificial shoulder joint   THERAPY DIAG:  Stiffness of left shoulder, not elsewhere classified  Chronic left shoulder pain  Abnormal posture  Muscle weakness (generalized)  RATIONALE FOR EVALUATION AND TREATMENT: Rehabilitation  ONSET DATE: 10/14/2022 - L reverse TSA  NEXT MD VISIT: 1 yr from 01/05/23    SUBJECTIVE:  SUBJECTIVE STATEMENT:  Jerzey reports a rough couple days due to the pain in her neck. She has called her neurosurgeon and has an appointment for a week Friday. She will also see her rheumatologist tomorrow.  PAIN: Are you having pain? Yes: NPRS scale: 3-4/10 Pain location: L neck and scapula Pain description: sharp Aggravating factors: shoulder exercises Relieving factors: sitting and resting in her recliner  PERTINENT HISTORY:  L shoulder advanced OA - reverse TSA on 10/14/22; R TKA 07/2019, B THR 2018 & 2017, posterior C1-2 cervical fusion 2018, rupture of flexor tendon of L hand with failed surgical repair 2020, Parkinson's, psoriatic arthritis, bladder cancer, HTN    PRECAUTIONS: Shoulder  WEIGHT BEARING RESTRICTIONS: No  FALLS:  Has patient fallen in last 6 months? No  LIVING ENVIRONMENT: Lives with: lives alone Lives in: House/apartment Stairs: Yes: External: 4 steps; on right going up, on left going up, and can reach both Has following equipment at home: Single point cane, Walker - 2 wheeled, shower chair, bed side commode, and Grab bars  OCCUPATION: Retired  PLOF: Independent and Leisure: volunteering at [employer], bible study, walking 1/3 mile daily  PATIENT GOALS: "To get rid of my pain and be able to do things with both hands."   OBJECTIVE:   DIAGNOSTIC FINDINGS:  09/14/21 - L shoulder  x-ray: IMPRESSION: 1. No acute bony abnormality. 2. Mild/moderate degenerative changes.  PATIENT SURVEYS:  Quick Dash 65.9 / 100 = 65.9 % (eval); 45.5 / 100 = 45.5% (12/27/22)  COGNITION: Overall cognitive status: Within functional limits for tasks assessed     SENSATION: WFL  POSTURE: rounded shoulders, forward head, weight shift right, and scoliosis  UPPER EXTREMITY ROM:   Active ROM Right eval Left eval Left 12/13/22 Left 12/27/22 Left 01/04/23 Left 01/11/23  Shoulder flexion 152 89 * 102 116 118 125  Shoulder extension        Shoulder abduction 144 55 * 79 87 95 102  Shoulder adduction        Shoulder internal rotation    67 70 77  Shoulder external rotation 71 45 * 61 69 69 70  Elbow flexion        Elbow extension        Wrist flexion        Wrist extension        Wrist ulnar deviation        Wrist radial deviation        Wrist pronation        Wrist supination         Passive ROM Left eval Left 12/27/22  Shoulder flexion 91 126  Shoulder extension    Shoulder abduction 64 99  Shoulder adduction    Shoulder internal rotation 31 at 30 ABD 67  Shoulder external rotation  69  Elbow flexion    Elbow extension    Wrist flexion    Wrist extension    Wrist ulnar deviation    Wrist radial deviation    Wrist pronation    Wrist supination    (Blank rows = not tested) * = pain  UPPER EXTREMITY MMT:  (deferred on eval)  MMT Right 12/27/22 Left 12/27/22 Left 01/11/23  Shoulder flexion 5 4- 4  Shoulder extension   4+  Shoulder abduction 5 4 4+  Shoulder adduction     Shoulder internal rotation 5 4+ 5  Shoulder external rotation 5 4 4+  Middle trapezius     Lower trapezius     Elbow  flexion     Elbow extension     Wrist flexion     Wrist extension     Wrist ulnar deviation     Wrist radial deviation     Wrist pronation     Wrist supination     Grip strength (lbs)     (Blank rows = not tested)  PALPATION:  TTP with taut bands and TPs in L posterolateral  shoulder and pecs    TODAY'S TREATMENT:   01/11/23 THERAPEUTIC EXERCISE: to improve flexibility, strength and mobility.  Verbal and tactile cues throughout for technique. Pulleys: L shoulder flexion & scaption x 3 min each Seated RTB B shoulder rows/retractions x 10 Seated RTB retraction + shoulder extension to neutral x 10 Standing YTB L shoulder IR x 10 Standing YTB L shoulder ER x 10  THERAPEUTIC ACTIVITIES: ROM & MMT Goal assessment   01/06/23 THERAPEUTIC EXERCISE: to improve flexibility, strength and mobility.  Verbal and tactile cues throughout for technique. Pulleys: L shoulder flexion & scaption x 3 min each S/L L shoulder ER + scap retraction 2 x 10 with 4+3 oz. putty tubs held together with velcro strap for added wt/resistance S/L L shoulder ABD + scap retraction 2 x 10 with 4+3 oz. putty tubs held together with velcro strap for added wt/resistance S/L L shoulder 4+3 oz. putty tubs held together with velcro strap S/L L shoulder flexion + scap retraction x 10 with 4+3 oz. putty tubs held together with velcro strap for added wt/resistance, 2nd set w/o added weight d/t c/o increased pain in l UT with weights Hooklying L shoulder protraction 1# 2 x 10  Hooklying L shoulder CW/CCW circles at 90 flexion 1# x 10 each direction Seated RTB B shoulder rows/retractions x 10 Seated RTB retraction + shoulder extension to neutral x 10 Standing YTB L shoulder IR x 10 Standing YTB L shoulder ER x 10  MANUAL THERAPY: To promote normalized muscle tension, improved flexibility, improved joint mobility, and reduced pain.  STM/TPR to L rhomboids, cervicothoracic paraspinals, LS and UT   01/04/23 THERAPEUTIC EXERCISE: to improve flexibility, strength and mobility.  Verbal and tactile cues throughout for technique. Pulleys: L shoulder flexion & scaption x 3 min each S/L L shoulder ER + scap retraction x 5 with 1# db, x 15 with 4 oz. putty tub for added wt/resistance (1# felt too heavy) S/L L  shoulder ABD + scap retraction 2 x 10 with 4 oz. putty tub for added wt/resistance S/L L shoulder flexion + scap retraction 2 x 10 with 4 oz. putty tub for added wt/resistance Hooklying L shoulder protraction 1# 2 x 10  Hooklying L shoulder CW/CCW circles at 90 flexion 1# x 10 each direction  THERAPEUTIC ACTIVITIES: ROM assessment  MANUAL THERAPY: To promote normalized muscle tension, improved flexibility, improved joint mobility, increased ROM, and reduced pain.  STM/TPR to L pecs, teres group, infraspinatus, LS, and rhomboids   PATIENT EDUCATION:  Education details: progress with PT and ongoing PT POC  Person educated: Patient Education method: Explanation Education comprehension: verbalized understanding   HOME EXERCISE PROGRAM: Access Code: [email or web address] URL: [email or web address] Date: 01/11/2023 Prepared by: Annie Paras  Exercises - Seated Shoulder Flexion Full Range (Mirrored)  - 1 x daily - 3-4 x weekly - 2 sets - 10 reps - 3 sec hold - Seated Single Arm Shoulder Abduction - Thumb Up  - 1 x daily - 3-4 x weekly - 2 sets - 10 reps - 3 sec hold - Standing Bilateral  Low Shoulder Row with Anchored Resistance  - 1 x daily - 3-4 x weekly - 2 sets - 10 reps - 5 sec hold - Scapular Retraction with Resistance Advanced  - 1 x daily - 3-4 x weekly - 2 sets - 10 reps - 5 sec hold - Shoulder Internal Rotation with Resistance (Mirrored)  - 1 x daily - 3 x weekly - 1-2 sets - 10 reps - 3 sec hold - Shoulder External Rotation with Anchored Resistance  - 1 x daily - 3 x weekly - 1-2 sets - 10 reps - 3 sec hold   ASSESSMENT:  CLINICAL IMPRESSION:  Maleyah reports she continues to note improving functional use of her L arm with daily activities including dressing, meal prep, laundry and cleaning. Her L shoulder AROM continues to improve and is now grossly [facility] for all motions with overall strength 4/5 to 5/5. She denies recent shoulder pain but does still experience intermittent L  sided neck pain with radicular pain into her L medial scapular, and has scheduled an appointment to f/u with her neurosurgeon next week. All goals now met and Laporsche feels ready to transition to her HEP, therefore will proceed with discharge from physical therapy for this episode. If new PT needs related to her neck identified on her f/u with the neurosurgeon, she will request a new PT order.  OBJECTIVE IMPAIRMENTS: decreased activity tolerance, decreased mobility, decreased ROM, decreased strength, increased fascial restrictions, impaired perceived functional ability, increased muscle spasms, impaired flexibility, impaired UE functional use, improper body mechanics, postural dysfunction, and pain.   ACTIVITY LIMITATIONS: carrying, lifting, bed mobility, bathing, dressing, reach over head, and hygiene/grooming  PARTICIPATION LIMITATIONS: meal prep, cleaning, laundry, driving, shopping, community activity, and church  PERSONAL FACTORS: Age, Fitness, Past/current experiences, Time since onset of injury/illness/exacerbation, and 3+ comorbidities: R TKA 07/2019, B THR 2018 & 2017, posterior C1-2 cervical fusion 2018, rupture of flexor tendon of L hand with failed surgical repair 2020, Parkinson's, psoriatic arthritis, bladder cancer, HTN  are also affecting patient's functional outcome.   REHAB POTENTIAL: Good  CLINICAL DECISION MAKING: Evolving/moderate complexity  EVALUATION COMPLEXITY: Low   GOALS: Goals reviewed with patient? Yes  SHORT TERM GOALS: Target date: 12/20/2022  Patient will be independent with initial HEP to improve outcomes and carryover.  Baseline:  Goal status: MET  12/13/22  2.  Patient will improve L shoulder P/AAROM to >/= 115 flexion, 90 abduction and 45 ER w/o increased pain to promote more functional L UE use.  Baseline:  Goal status: MET  01/04/23   LONG TERM GOALS: Target date: 01/17/2023  Patient will be independent with ongoing/advanced HEP for self-management at  home.  Baseline:  Goal status: MET  01/04/23 - Met for current HEP  2.  Patient will report 50-75% improvement in L shoulder pain to improve QOL.  Baseline:  Goal status: MET  12/27/22 - 90% improvement  3.  Patient to demonstrate improved upright posture with posterior shoulder girdle engaged to promote improved glenohumeral joint mobility. Baseline:  Goal status: MET  01/11/23 - Good awareness of neutral shoulder posture and cervical retraction but fixed R head lateral shift  4.  Patient to improve L shoulder AROM to [facility] without pain provocation to allow for increased ease of ADLs.  Baseline:  Goal status: MET  01/11/23   5.  Patient will demonstrate improved L shoulder strength to >/= 4/5 for functional UE use. Baseline:  Goal status: MET  01/11/23   6  Patient will  report </= 55% on QuickDASH to demonstrate improved functional ability.  Baseline: 65.9 / 100 = 65.9% Goal status: MET  12/27/22 - QuickDASH: 45.5 / 100 = 45.5%   PLAN:  PT FREQUENCY: 2x/week  PT DURATION: 8 weeks  PLANNED INTERVENTIONS: Therapeutic exercises, Therapeutic activity, Neuromuscular re-education, Balance training, Gait training, Patient/Family education, Self Care, Joint mobilization, Dry Needling, Electrical stimulation, Cryotherapy, Moist heat, scar mobilization, Taping, Vasopneumatic device, Ionotophoresis '4mg'$ /ml Dexamethasone, Manual therapy, and Re-evaluation  PLAN FOR NEXT SESSION: HEP review & update/consolidation - L shoulder AROM,  L shoulder and scapular strengthening; MT and modalities as needed/tolerated to address pain and abnormal muscle tension    PHYSICAL THERAPY DISCHARGE SUMMARY  Visits from Start of Care: 14  Current functional level related to goals / functional outcomes:   Refer to above clinical impression and goal assessment.   Remaining deficits:   Intermittent L-sided neck and radicular scapular pain - Kairo has an appointment to f/u with her neurosurgeon next week.    Education / Equipment:   HEP   Patient agrees to discharge. Patient goals were met. Patient is being discharged due to meeting the stated rehab goals.   Percival Spanish, PT 01/11/2023, 3:44 PM

## 2023-01-12 DIAGNOSIS — L409 Psoriasis, unspecified: Secondary | ICD-10-CM | POA: Diagnosis not present

## 2023-01-12 DIAGNOSIS — L4050 Arthropathic psoriasis, unspecified: Secondary | ICD-10-CM | POA: Diagnosis not present

## 2023-01-12 DIAGNOSIS — Z79899 Other long term (current) drug therapy: Secondary | ICD-10-CM | POA: Diagnosis not present

## 2023-01-12 DIAGNOSIS — Z6820 Body mass index (BMI) 20.0-20.9, adult: Secondary | ICD-10-CM | POA: Diagnosis not present

## 2023-01-12 DIAGNOSIS — M1991 Primary osteoarthritis, unspecified site: Secondary | ICD-10-CM | POA: Diagnosis not present

## 2023-01-12 DIAGNOSIS — M542 Cervicalgia: Secondary | ICD-10-CM | POA: Diagnosis not present

## 2023-01-12 DIAGNOSIS — M154 Erosive (osteo)arthritis: Secondary | ICD-10-CM | POA: Diagnosis not present

## 2023-01-17 ENCOUNTER — Encounter: Payer: Medicare Other | Admitting: Physical Therapy

## 2023-01-18 ENCOUNTER — Encounter: Payer: Medicare Other | Admitting: Physical Therapy

## 2023-01-20 DIAGNOSIS — M542 Cervicalgia: Secondary | ICD-10-CM | POA: Diagnosis not present

## 2023-01-20 DIAGNOSIS — Z981 Arthrodesis status: Secondary | ICD-10-CM | POA: Diagnosis not present

## 2023-01-25 DIAGNOSIS — R5383 Other fatigue: Secondary | ICD-10-CM | POA: Diagnosis not present

## 2023-01-25 DIAGNOSIS — Z79899 Other long term (current) drug therapy: Secondary | ICD-10-CM | POA: Diagnosis not present

## 2023-01-25 DIAGNOSIS — L4050 Arthropathic psoriasis, unspecified: Secondary | ICD-10-CM | POA: Diagnosis not present

## 2023-01-26 DIAGNOSIS — M542 Cervicalgia: Secondary | ICD-10-CM | POA: Diagnosis not present

## 2023-01-31 DIAGNOSIS — H35371 Puckering of macula, right eye: Secondary | ICD-10-CM | POA: Diagnosis not present

## 2023-02-07 ENCOUNTER — Encounter: Payer: Self-pay | Admitting: Family Medicine

## 2023-02-07 ENCOUNTER — Ambulatory Visit (HOSPITAL_BASED_OUTPATIENT_CLINIC_OR_DEPARTMENT_OTHER)
Admission: RE | Admit: 2023-02-07 | Discharge: 2023-02-07 | Disposition: A | Discharge status: Home / Self Care | Payer: Medicare Other | Source: Ambulatory Visit | Attending: Family Medicine | Admitting: Family Medicine

## 2023-02-07 ENCOUNTER — Other Ambulatory Visit: Payer: Self-pay | Admitting: Family Medicine

## 2023-02-07 DIAGNOSIS — M542 Cervicalgia: Secondary | ICD-10-CM | POA: Diagnosis not present

## 2023-02-07 DIAGNOSIS — M4312 Spondylolisthesis, cervical region: Secondary | ICD-10-CM | POA: Diagnosis not present

## 2023-02-08 ENCOUNTER — Telehealth: Payer: Self-pay | Admitting: Family Medicine

## 2023-02-08 NOTE — Telephone Encounter (Signed)
Called pt was advised [facility] are requesting imaging And imaging on disc. Will need to call them to get it to them pt stated would give them call.

## 2023-02-08 NOTE — Telephone Encounter (Signed)
[facility] is requesting pt's recent imaging results faxed to [phone] and put on a disc if possible.

## 2023-02-14 ENCOUNTER — Ambulatory Visit: Payer: Self-pay | Admitting: Licensed Clinical Social Worker

## 2023-02-14 NOTE — Patient Instructions (Signed)
Visit Information  Thank you for taking time to visit with me today. Please don't hesitate to contact me if I can be of assistance to you.   Following are the goals we discussed today:   Goals Addressed               This Visit's Progress     Patient Stated she is scheduled to see orthopedic specialist tomorrow related to neck pain issues (pt-stated)        Interventions:  Discussed program support with client via phone today Discussed pain issues of client. She has neck pain issues. She is scheduled to see orthopedic specialist tomorrow regarding neck pain issues Discussed family support. She has daughter who is supportive. Daughter resides in [place], [place] Provided counseling support Discussed decreased sleep of client. Discussed medication procurement. Discussed transport needs. Client said she drives herself to needed appointments Discussed physical therapy sessions completed. Encouraged client to call LCSW as needed for SW support at [phone]        Our next appointment is by telephone on 03/29/23 at 11:00 AM   Please call the care guide team at [phone] if you need to cancel or reschedule your appointment.   If you are experiencing a Mental Health or [place] or need someone to talk to, please go to [facility] [place] [phone])   The patient verbalized understanding of instructions, educational materials, and care plan provided today and DECLINED offer to receive copy of patient instructions, educational materials, and care plan.   The patient has been provided with contact information for the care management team and has been advised to call with any health related questions or concerns.   Norva Riffle.Biddie Sebek MSW, LCSW Licensed Clinical Social Worker [facility] Care Management [phone]. [phone]

## 2023-02-14 NOTE — Patient Outreach (Signed)
  Care Coordination   Follow Up Visit Note   02/14/2023 Name: ADALAI PATIENCE MRN: [id number] DOB: 1932-07-16  JORGINA RODEZNO is a 89 y.o. year old female who sees Mosie Lukes, MD for primary care. I spoke with  [name] by phone today.  What matters to the patients health and wellness today?  Patient is scheduled to see orthopedic   specialist tomorrow regarding neck pain issues    Goals Addressed               This Visit's Progress     Patient Stated she is scheduled to see orthopedic specialist tomorrow related to neck pain issues (pt-stated)        Interventions:  Discussed program support with client via phone today Discussed pain issues of client. She has neck pain issues. She is scheduled to see orthopedic specialist tomorrow regarding neck pain issues Discussed family support. She has daughter who is supportive. Daughter resides in [place], [place] Provided counseling support Discussed decreased sleep of client. Discussed medication procurement. Discussed transport needs. Client said she drives herself to needed appointments Discussed physical therapy sessions completed. Encouraged client to call LCSW as needed for SW support at [phone]     SDOH assessments and interventions completed:  Yes  SDOH Interventions Today    Flowsheet Row Most Recent Value  SDOH Interventions   Depression Interventions/Treatment  Counseling  Physical Activity Interventions Other (Comments)  [stress in managing medical needs]  Stress Interventions Provide Counseling        Care Coordination Interventions:  Yes, provided   Interventions Today    Flowsheet Row Most Recent Value  Chronic Disease   Chronic disease during today's visit Other  [spoke with client about client needs]  General Interventions   General Interventions Discussed/Reviewed General Interventions Discussed, [employer]  [discussed program support]  Exercise Interventions   Exercise  Discussed/Reviewed Physical Activity  [discussed physical therapy sessions completed]  Physical Activity Discussed/Reviewed Types of exercise  Education Interventions   Education Provided Provided Education  Provided Verbal Education On [place] Discussed/Reviewed Anxiety, Coping Strategies  [discussed her management of medical needs]  Safety Interventions   Safety Discussed/Reviewed Fall Risk        Follow up plan: Follow up call scheduled for 03/29/23 at 11:00 AM    Encounter Outcome:  Pt. Visit Completed   Norva Riffle.Coty Larsh MSW, LCSW Licensed Clinical Social Worker [facility] Care Management [phone]. [phone]

## 2023-02-15 DIAGNOSIS — M47812 Spondylosis without myelopathy or radiculopathy, cervical region: Secondary | ICD-10-CM | POA: Diagnosis not present

## 2023-02-15 DIAGNOSIS — M791 Myalgia, unspecified site: Secondary | ICD-10-CM | POA: Diagnosis not present

## 2023-02-15 DIAGNOSIS — M542 Cervicalgia: Secondary | ICD-10-CM | POA: Diagnosis not present

## 2023-02-20 ENCOUNTER — Other Ambulatory Visit: Payer: Self-pay | Admitting: Family Medicine

## 2023-02-21 ENCOUNTER — Ambulatory Visit: Payer: Medicare Other | Attending: Physical Medicine and Rehabilitation | Admitting: Physical Therapy

## 2023-02-21 ENCOUNTER — Other Ambulatory Visit: Payer: Self-pay

## 2023-02-21 ENCOUNTER — Encounter: Payer: Self-pay | Admitting: Physical Therapy

## 2023-02-21 DIAGNOSIS — M6281 Muscle weakness (generalized): Secondary | ICD-10-CM | POA: Insufficient documentation

## 2023-02-21 DIAGNOSIS — M62838 Other muscle spasm: Secondary | ICD-10-CM | POA: Diagnosis not present

## 2023-02-21 DIAGNOSIS — M542 Cervicalgia: Secondary | ICD-10-CM | POA: Diagnosis not present

## 2023-02-21 DIAGNOSIS — R293 Abnormal posture: Secondary | ICD-10-CM | POA: Insufficient documentation

## 2023-02-21 NOTE — Therapy (Signed)
OUTPATIENT PHYSICAL THERAPY CERVICAL EVALUATION   Patient Name: Stephanie [name] MRN: [id number] DOB:October 29, 1932, 89 y.o., female Today's Date: 02/21/2023   END OF SESSION:  PT End of Session - 02/21/23 1020     Visit Number 1    Date for PT Re-Evaluation 04/18/23    Authorization Type Medicare & BCBS    PT Start Time 1020    PT Stop Time 1140    PT Time Calculation (min) 80 min    Activity Tolerance Patient limited by pain    Behavior During Therapy [facility] for tasks assessed/performed             Past Medical History:  Diagnosis Date   Allergy    Anemia    past hx    Arthropathy of cervical spine 11/27/2012   Right  Xray at [facility]  On 04/08/2016  AP, lateral, and lateral flexion and extension views of the cervical spine are submitted for evaluation.   No acute fracture identified in the cervical spine.   There is redemonstration of approximately 2 mm of C3 on C4 anterolisthesis in neutral which slightly increases in flexion relative to neutral and extension. Slight C5 on C6 retrolisthesis does not change i   Benign hypertension without congestive heart failure    Benign mole    right hcets mole, bleeds when dried with towel   Bilateral dry eyes    Bladder cancer 11-08-16   Cervical spondylosis    Cervicalgia    2 screws in place    Depression with anxiety 08-Nov-2016   prior to husbands death    Dyspnea    Gross hematuria    developed after first hip replacment, led to indicental finding of bladder tumor, bleeding now resolved    H/O total shoulder replacement, left    History of kidney stones    1970's   Left hand weakness    due to reinjury of flxor tendon s/p tendon surgery march 12-2018   Lumbar stenosis    Malignant tumor of urinary bladder 07/2016   Pre-stage I   OA (osteoarthritis)    Parkinson disease dx 2012   neurologist-  dr siddiqui at [facility]   Psoriatic arthritis     Dr Nickola Major , [place] [facility]    Rupture of flexor tendon of hand 2020   right   Past  Surgical History:  Procedure Laterality Date   BIOPSY MASS LEFT FIRST METACARPAL   06/23/2006   benign   CATARACT EXTRACTION W/ INTRAOCULAR LENS  IMPLANT, BILATERAL  1999 and 2003   CERVICAL FUSION  02/2017   c1-c2 ; reports she has 2 screws 2in long in place done at [facility] ; patient exhibits VERY LIMITED NECK ROM   COLONOSCOPY     CYSTOSCOPY W/ RETROGRADES Bilateral 09/22/2016   Procedure: CYSTOSCOPY WITH RETROGRADE PYELOGRAM;  Surgeon: Sebastian Ache, MD;  Location: [facility];  Service: Urology;  Laterality: Bilateral;   D & C HYSTEROSCOPY W/ RESECTION POLYP  07/11/2000   DILATION AND CURETTAGE OF UTERUS  1960's   EXCISION CYST AND DEBRIDEMENT RIGHT WIRST AND REMOVAL FORGEIGN BODY  01/09/2009   FLEXOR TENDON REPAIR Left 12/12/2018   Procedure: REPAIR/TRANSFER FLEXOR DIGITORUM PROFUNDUS OF LEFT SMALL FINGER;  Surgeon: Cindee Salt, MD;  Location: [facility];  Service: Orthopedics;  Laterality: Left;   LAPAROSCOPY  yrs ago   infertility    METACARPOPHALANGEAL JOINT ARTHRODESIS Right 05/25/1996   REVERSE SHOULDER ARTHROPLASTY Left 10/14/2022   Procedure: REVERSE  SHOULDER ARTHROPLASTY;  Surgeon: Francena Hanly, MD;  Location: [facility];  Service: Orthopedics;  Laterality: Left;    TENDON REPAIR Left 01/15/2019   Hand   TONSILLECTOMY AND ADENOIDECTOMY  child   TOTAL HIP ARTHROPLASTY Left 07/07/2016   Procedure: LEFT TOTAL HIP ARTHROPLASTY ANTERIOR APPROACH;  Surgeon: Ollen Gross, MD;  Location: [facility];  Service: Orthopedics;  Laterality: Left;   TOTAL HIP ARTHROPLASTY Right 09/28/2017   Procedure: RIGHT TOTAL HIP ARTHROPLASTY ANTERIOR APPROACH;  Surgeon: Ollen Gross, MD;  Location: [facility];  Service: Orthopedics;  Laterality: Right;   TOTAL KNEE ARTHROPLASTY Right 07/30/2019   Procedure: TOTAL KNEE ARTHROPLASTY;  Surgeon: Ollen Gross, MD;  Location: [facility];  Service: Orthopedics;  Laterality: Right;    TRANSURETHRAL RESECTION OF BLADDER  TUMOR N/A 09/22/2016   Procedure: TRANSURETHRAL RESECTION OF BLADDER TUMOR (TURBT);  Surgeon: Sebastian Ache, MD;  Location: [facility];  Service: Urology;  Laterality: N/A;   Patient Active Problem List   Diagnosis Date Noted   S/P reverse total shoulder arthroplasty, left 10/14/2022   Preoperative cardiovascular examination 10/04/2022   SOB (shortness of breath) 08/02/2022   Insomnia 08/02/2022   Hyponatremia 08/01/2022   Shoulder pain, left 09/14/2021   History of COVID-19 01/06/2021   Diarrhea 09/03/2020   Vitamin D deficiency 09/03/2020   OA (osteoarthritis) of knee 07/30/2019   Tinea corporis 06/08/2019   Psoriatic arthritis 04/02/2019   Anemia 03/13/2018   Hot flashes 03/13/2018   S/P cervical spinal fusion 03/31/2017   Neck pain 12/12/2016   Depression with anxiety 10/17/2016   Bladder cancer 10/17/2016   OA (osteoarthritis) of hip 07/07/2016   Spinal stenosis of lumbar region with radiculopathy 04/08/2016   Spondylolisthesis of lumbar region 04/08/2016   Spondylosis of cervical region without myelopathy or radiculopathy 04/08/2016   Preventative health care 01/18/2016   Low back pain 01/18/2016   History of chicken pox    Hyperlipidemia 12/15/2014   Allergic rhinitis 06/25/2014   Allergy to bee sting 06/10/2014   TMJ tenderness 11/16/2013   Other malaise and fatigue 05/07/2013   Arthropathy of cervical spine (HCC) 11/27/2012   Muscle spasms of neck 11/27/2012   Parkinson's disease 05/24/2012   Conjunctivochalasis 03/09/2012   Epiretinal membrane 03/09/2012   Hyperopia with astigmatism and presbyopia 03/09/2012   Pseudophakia 03/09/2012   HTN (hypertension) 08/31/2011   Right knee pain 08/31/2011   Benign hypertensive heart disease without heart failure 05/12/2011    PCP: Bradd Canary, MD   REFERRING PROVIDER: Romero Belling, MD   REFERRING DIAG: Multilevel cervical spondylosis, L neck & trapezial myofascial pain/contracture   THERAPY  DIAG:  Cervicalgia  Abnormal posture  Muscle weakness (generalized)  Other muscle spasm  RATIONALE FOR EVALUATION AND TREATMENT: Rehabilitation  ONSET DATE: acute on chronic  NEXT MD VISIT: ~June 2024   SUBJECTIVE:  SUBJECTIVE STATEMENT: Pt reports chronic neck pain worsening of late. Pain interfering with all aspects of daily life - interferes with sleep, prevents her from walking for exercise, and unable to go to church. She also notes a clicking in her neck with increase in pain. She has tried her HEP from prior PT episodes but unable to get any relief.  PAIN: Are you having pain? Yes: NPRS scale: 8/10 currently, up to 10/10 Pain location: L neck and scapula Pain description: constant sharp Aggravating factors: any movement of head and neck, dyskinesia from PD meds Relieving factors: sitting still in her recliner  PERTINENT HISTORY:  Chronic neck pain, L shoulder advanced OA s/p reverse TSA on 10/14/22; R TKA 07/2019, B THR 2018 & 2017, posterior C1-2 cervical fusion 2018, rupture of flexor tendon of L hand with failed surgical repair 2020, Parkinson's, psoriatic arthritis, bladder cancer, HTN    PRECAUTIONS: None  HAND DOMINANCE: Right  WEIGHT BEARING RESTRICTIONS: No  FALLS:  Has patient fallen in last 6 months? No  LIVING ENVIRONMENT: Lives with: lives alone Lives in: House/apartment Stairs: Yes: External: 4 steps; on right going up, on left going up, and can reach both Has following equipment at home: Single point cane, Walker - 2 wheeled, Shower bench, bed side commode, and Grab bars  OCCUPATION: Retired  [employer]: Independent and Leisure: volunteering at [employer], bible study, walking 1/3 mile daily - unable recently due to pain   PATIENT GOALS: "Get rid of this pain so  I can go back to my normal activities."   OBJECTIVE: (objective measures completed at initial evaluation unless otherwise dated)  DIAGNOSTIC FINDINGS:  Cervical MRI - results not available, but pt reports she was told she had severe OA  02/07/23 - Cervical spine x-ray: IMPRESSION: Multilevel degenerative change and postoperative change without acute abnormality.  PATIENT SURVEYS:  NDI 26 / 50 = 52.0 %  COGNITION: Overall cognitive status: Within functional limits for tasks assessed  SENSATION: WFL  POSTURE:  rounded shoulders, forward head, weight shift right, and head laterally shifted to R  PALPATION: Severe increased muscle tension and TTP in L>R UT, LS and cervical paraspinals as well as L periscapular muscles   CERVICAL ROM:   Active ROM AROM  eval  Flexion 46  Extension 22 *  Right lateral flexion 16 *  Left lateral flexion 10 *  Right rotation 16 *  Left rotation 18*   (Blank rows = not tested, * = pain) 02/21/23 - clicking with B rotation  UPPER EXTREMITY ROM:  Active ROM Right eval Left eval  Shoulder flexion 146 111  Shoulder extension 50 42  Shoulder abduction 155 118  Shoulder adduction    Shoulder internal rotation FIR to sacrum FIR to lateral buttock  Shoulder external rotation FER T3 FER T1  Elbow flexion    Elbow extension    Wrist flexion    Wrist extension    Wrist ulnar deviation    Wrist radial deviation    Wrist pronation    Wrist supination     (Blank rows = not tested)  UPPER EXTREMITY MMT: (TBA next visit)  MMT Right eval Left eval  Shoulder flexion    Shoulder extension    Shoulder abduction    Shoulder adduction    Shoulder internal rotation    Shoulder external rotation    Middle trapezius    Lower trapezius    Elbow flexion    Elbow extension    Wrist flexion  Wrist extension    Wrist ulnar deviation    Wrist radial deviation    Wrist pronation    Wrist supination    Grip strength     (Blank rows = not  tested)  CERVICAL SPECIAL TESTS:  Spurling's test: Positive and Distraction test: Positive    TODAY'S TREATMENT:   02/21/23 Initial eval   MANUAL THERAPY: To promote normalized muscle tension, improved flexibility, improved joint mobility, increased ROM, and reduced pain. Skilled palpation and monitoring of soft tissue during DN Trigger Point Dry-Needling  Treatment instructions: Expect mild to moderate muscle soreness. S/S of pneumothorax if dry needled over a lung field, and to seek immediate medical attention should they occur. Patient verbalized understanding of these instructions and education. Patient Consent Given: Yes Education handout provided: Yes Muscles treated: B UT & LS Electrical stimulation performed: No Parameters: N/A Treatment response/outcome: Twitch Response Elicited and Palpable Increase in Muscle Length STM/DTM, manual TPR and pin & stretch to muscles addressed with DN   MODALITIES: IFC to B UT & LS, 80-150 Hz, intensity to patient tolerance x 15 min Moist heat to cervical spine and upper shoulders   PATIENT EDUCATION:  Education details: PT eval findings, anticipated POC, role of DN, and DN rational, procedure, outcomes, potential side effects, and recommended post-treatment exercises/activity  Person educated: Patient Education method: Explanation, Demonstration, and Handouts Education comprehension: verbalized understanding  HOME EXERCISE PROGRAM: TBD   ASSESSMENT:  CLINICAL IMPRESSION: Stephanie [name] is a 89 y.o. female who was seen today for physical therapy evaluation and treatment for acute on chronic neck pain. She reports her cervical MRI and x-ray revealed severe arthritis but no indications for surgery. Pain so severe that it limits all aspects of her daily life, significantly interfering with her sleep, preventing her from walking for exercise, and keeping her from attending church services.  Current deficits include painful and limited  cervical ROM in all planes, increased muscle tension and guarding with TTP throughout L>R cervical and periscapular musculature, limited upper extremity ROM and functional use of UEs, and postural and UE weakness.  Stephanie [name] will benefit from skilled PT to address above deficits to improve mobility and activity tolerance with decreased pain interference.  She has been reluctant to try DN in previous  PT episodes given a previously negative experience with this, however willing to give it a try today. After explanation of DN rational, procedures, outcomes and potential side effects, patient verbalized consent to DN treatment in conjunction with manual STM/DTM and TPR to reduce ttp/muscle tension. Muscles treated as indicated above. DN produced normal response with good twitches elicited resulting in palpable reduction in pain/ttp and muscle tension. Pt educated to expect mild to moderate muscle soreness for up to 24-48 hrs and instructed to continue gentle ROM and stretching in pain free ROM as well as current tolerated activity level with pt verbalizing understanding of theses instructions.  Session concluded with estim and moist heat to B neck and upper shoulders to promote further muscle relaxation/relief from muscle spasm.  OBJECTIVE IMPAIRMENTS: decreased activity tolerance, decreased endurance, decreased knowledge of condition, decreased mobility, difficulty walking, decreased ROM, decreased strength, hypomobility, increased fascial restrictions, impaired perceived functional ability, increased muscle spasms, impaired flexibility, impaired UE functional use, improper body mechanics, postural dysfunction, and pain.   ACTIVITY LIMITATIONS: carrying, lifting, standing, sleeping, transfers, bed mobility, bathing, toileting, dressing, reach over head, and hygiene/grooming  PARTICIPATION LIMITATIONS: meal prep, cleaning, laundry, driving, shopping, community activity, occupation, yard work, and church  PERSONAL  FACTORS: Age, Fitness, Past/current experiences, Time since onset of injury/illness/exacerbation, and 3+ comorbidities: Chronic neck pain, L shoulder advanced OA s/p reverse TSA on 10/14/22; R TKA 07/2019, B THR 2018 & 2017, posterior C1-2 cervical fusion 2018, rupture of flexor tendon of L hand with failed surgical repair 2020, Parkinson's, psoriatic arthritis, bladder cancer, HTN    are also affecting patient's functional outcome.   REHAB POTENTIAL: Good  CLINICAL DECISION MAKING: Evolving/moderate complexity  EVALUATION COMPLEXITY: Moderate   GOALS: Goals reviewed with patient? Yes  SHORT TERM GOALS: Target date: 03/21/2023  Patient will be independent with initial HEP to improve outcomes and carryover.  Baseline: TBD Goal status: INITIAL  2.  Patient will report 25% improvement in neck pain to improve QOL.   Baseline: 8-10/10 Goal status: INITIAL  LONG TERM GOALS: Target date: 04/18/2023  Patient will be independent with ongoing/advanced HEP for self-management at home.  Baseline:  Goal status: INITIAL  2.  Patient will demonstrate improved posture to decrease muscle imbalance. Baseline: rounded shoulders, forward head, weight shift right, and head laterally shifted to R Goal status: INITIAL  3.  Patient will report 50-75% improvement in neck pain to improve QOL.  Baseline: 8-10/10 Goal status: INITIAL  4.  Patient will demonstrate functional pain free cervical ROM for safety with driving.  Baseline: refer to above cervical ROM table Goal status: INITIAL  5.  Patient will report </= 37% on NDI to demonstrate improved functional ability.  Baseline: 26 / 50 = 52.0 % Goal status: INITIAL  6  Patient will report >/= 50% improvement in her sleep due to decreased neck pain. Baseline: Sleep greatly disturbed for up to 3-5 hrs due to neck pain per NDI Goal status: INITIAL   7.  Patient will be able to resume attending church services w/o limitation due to neck pain. Baseline:  Has been unable to attend church Goal status: INITIAL   8. Patient will be able to resume walking for exercise w/o limitation due to neck pain. Baseline: Has been unable to walk d/t pain Goal status: INITIAL   PLAN:  PT FREQUENCY: 2x/week  PT DURATION: 8 weeks  PLANNED INTERVENTIONS: Therapeutic exercises, Therapeutic activity, Neuromuscular re-education, Balance training, Patient/Family education, Self Care, Joint mobilization, Dry Needling, Electrical stimulation, Spinal mobilization, Cryotherapy, Moist heat, Taping, Ultrasound, Ionotophoresis 4mg /ml Dexamethasone, Manual therapy, and Re-evaluation  PLAN FOR NEXT SESSION: Assess response to DN; complete UE MMT; create initial HEP; MT +/- DN as indicated and benefit noted   Marry Guan, PT 02/21/2023, 12:10 PM

## 2023-02-27 NOTE — Assessment & Plan Note (Signed)
Continues to follow with neurology and is maintained on Sinemet

## 2023-02-27 NOTE — Assessment & Plan Note (Signed)
Well controlled, no changes to meds. Encouraged heart healthy diet such as the DASH diet and exercise as tolerated.  °

## 2023-02-27 NOTE — Assessment & Plan Note (Signed)
Supplement and monitor 

## 2023-02-27 NOTE — Assessment & Plan Note (Signed)
Tolerating statin, encouraged heart healthy diet, avoid trans fats, minimize simple carbs and saturated fats. Increase exercise as tolerated 

## 2023-02-28 ENCOUNTER — Ambulatory Visit (INDEPENDENT_AMBULATORY_CARE_PROVIDER_SITE_OTHER): Payer: Medicare Other | Admitting: Family Medicine

## 2023-02-28 VITALS — BP 110/64 | HR 85 | Temp 98.0°F | Resp 16 | Ht 62.0 in | Wt 111.4 lb

## 2023-02-28 DIAGNOSIS — Z79899 Other long term (current) drug therapy: Secondary | ICD-10-CM

## 2023-02-28 DIAGNOSIS — I10 Essential (primary) hypertension: Secondary | ICD-10-CM

## 2023-02-28 DIAGNOSIS — E559 Vitamin D deficiency, unspecified: Secondary | ICD-10-CM

## 2023-02-28 DIAGNOSIS — M542 Cervicalgia: Secondary | ICD-10-CM

## 2023-02-28 DIAGNOSIS — E7800 Pure hypercholesterolemia, unspecified: Secondary | ICD-10-CM

## 2023-02-28 DIAGNOSIS — R300 Dysuria: Secondary | ICD-10-CM | POA: Diagnosis not present

## 2023-02-28 DIAGNOSIS — G20A1 Parkinson's disease without dyskinesia, without mention of fluctuations: Secondary | ICD-10-CM

## 2023-02-28 DIAGNOSIS — R739 Hyperglycemia, unspecified: Secondary | ICD-10-CM

## 2023-02-28 LAB — CBC WITH DIFFERENTIAL/PLATELET
Basophils Absolute: 0 10*3/uL (ref 0.0–0.1)
Basophils Relative: 0.5 % (ref 0.0–3.0)
Eosinophils Absolute: 0.2 10*3/uL (ref 0.0–0.7)
Eosinophils Relative: 1.8 % (ref 0.0–5.0)
HCT: 40.6 % (ref 36.0–46.0)
Hemoglobin: 13.7 g/dL (ref 12.0–15.0)
Lymphocytes Relative: 16.7 % (ref 12.0–46.0)
Lymphs Abs: 1.5 10*3/uL (ref 0.7–4.0)
MCHC: 33.6 g/dL (ref 30.0–36.0)
MCV: 89.6 fl (ref 78.0–100.0)
Monocytes Absolute: 0.7 10*3/uL (ref 0.1–1.0)
Monocytes Relative: 7.9 % (ref 3.0–12.0)
Neutro Abs: 6.5 10*3/uL (ref 1.4–7.7)
Neutrophils Relative %: 73.1 % (ref 43.0–77.0)
Platelets: 244 10*3/uL (ref 150.0–400.0)
RBC: 4.53 Mil/uL (ref 3.87–5.11)
RDW: 14.2 % (ref 11.5–15.5)
WBC: 8.9 10*3/uL (ref 4.0–10.5)

## 2023-02-28 LAB — URINALYSIS, ROUTINE W REFLEX MICROSCOPIC
Bilirubin Urine: NEGATIVE
Nitrite: POSITIVE — AB
Specific Gravity, Urine: 1.015 (ref 1.000–1.030)
Total Protein, Urine: 30 — AB
Urine Glucose: NEGATIVE
Urobilinogen, UA: 0.2 (ref 0.0–1.0)
pH: 7 (ref 5.0–8.0)

## 2023-02-28 LAB — VITAMIN D 25 HYDROXY (VIT D DEFICIENCY, FRACTURES): VITD: 59.69 ng/mL (ref 30.00–100.00)

## 2023-02-28 LAB — COMPREHENSIVE METABOLIC PANEL
ALT: 7 U/L (ref 0–35)
AST: 21 U/L (ref 0–37)
Albumin: 4.7 g/dL (ref 3.5–5.2)
Alkaline Phosphatase: 46 U/L (ref 39–117)
BUN: 23 mg/dL (ref 6–23)
CO2: 30 mEq/L (ref 19–32)
Calcium: 9.8 mg/dL (ref 8.4–10.5)
Chloride: 92 mEq/L — ABNORMAL LOW (ref 96–112)
Creatinine, Ser: 0.76 mg/dL (ref 0.40–1.20)
GFR: 69.61 mL/min (ref >60.00)
Glucose, Bld: 99 mg/dL (ref 70–99)
Potassium: 4.1 mEq/L (ref 3.5–5.1)
Sodium: 133 mEq/L — ABNORMAL LOW (ref 135–145)
Total Bilirubin: 0.7 mg/dL (ref 0.2–1.2)
Total Protein: 6.7 g/dL (ref 6.0–8.3)

## 2023-02-28 LAB — LIPID PANEL
Cholesterol: 155 mg/dL (ref 0–200)
HDL: 56.5 mg/dL (ref >39.00)
LDL Cholesterol: 78 mg/dL (ref 0–99)
NonHDL: 98.34
Total CHOL/HDL Ratio: 3
Triglycerides: 103 mg/dL (ref 0.0–149.0)
VLDL: 20.6 mg/dL (ref 0.0–40.0)

## 2023-02-28 LAB — HEMOGLOBIN A1C: Hgb A1c MFr Bld: 5.6 % (ref 4.6–6.5)

## 2023-02-28 LAB — TSH: TSH: 1.19 u[IU]/mL (ref 0.35–5.50)

## 2023-02-28 MED ORDER — CHOLESTYRAMINE 4 G PO PACK: 4.0000 g | PACK | Freq: Two times a day (BID) | ORAL | 2 refills | Status: AC | PRN

## 2023-02-28 NOTE — Assessment & Plan Note (Signed)
Is beginning to limit her activity her pain is so bad after a couple of hours up that she is unable to go to church or to visit with friends. After recent MRI she has been told she is not a surgery candidate and we just have to manage pain. She is in PT and is now working with Dr Tyrone Nine at [facility] for pain management. She is now on Meloxicam 15 prn, Tizanidine 2 mg tid and Tylenol tid. She should add topical treatments as directed.

## 2023-02-28 NOTE — Patient Instructions (Signed)
CBD daily on website called [employer], the cream is Estra strength, mint  Ice can alternate heat can get good ice packs on [employer]

## 2023-02-28 NOTE — Progress Notes (Signed)
Subjective:    Patient ID: Stephanie [name], female    DOB: 06-Nov-1932, 89 y.o.   MRN: [id number]  No chief complaint on file.   HPI Patient is in today for follow up on chronic medical concerns. No recent febrile illness or hospitalizations. Denies CP/palp/SOB/HA/congestion/fevers/GI or GU c/o. Taking meds as prescribed. She is tearful about the intensity of her pain in her neck and notes it is limiting her ability to go to church or see friends. She is working with Dr Tyrone Nine for pain management and is seeing PT.   Past Medical History:  Diagnosis Date   Allergy    Anemia    past hx    Arthropathy of cervical spine 11/27/2012   Right  Xray at [facility]  On 04/08/2016  AP, lateral, and lateral flexion and extension views of the cervical spine are submitted for evaluation.   No acute fracture identified in the cervical spine.   There is redemonstration of approximately 2 mm of C3 on C4 anterolisthesis in neutral which slightly increases in flexion relative to neutral and extension. Slight C5 on C6 retrolisthesis does not change i   Benign hypertension without congestive heart failure    Benign mole    right hcets mole, bleeds when dried with towel   Bilateral dry eyes    Bladder cancer 10/28/16   Cervical spondylosis    Cervicalgia    2 screws in place    Depression with anxiety 2016/10/28   prior to husbands death    Dyspnea    Gross hematuria    developed after first hip replacment, led to indicental finding of bladder tumor, bleeding now resolved    H/O total shoulder replacement, left    History of kidney stones    1970's   Left hand weakness    due to reinjury of flxor tendon s/p tendon surgery march 12-2018   Lumbar stenosis    Malignant tumor of urinary bladder 07/2016   Pre-stage I   OA (osteoarthritis)    Parkinson disease dx 2012   neurologist-  dr siddiqui at [facility]   Psoriatic arthritis     Dr Nickola Major , [place] [facility]    Rupture of flexor tendon of hand 2020   right     Past Surgical History:  Procedure Laterality Date   BIOPSY MASS LEFT FIRST METACARPAL   06/23/2006   benign   CATARACT EXTRACTION W/ INTRAOCULAR LENS  IMPLANT, BILATERAL  1999 and 2003   CERVICAL FUSION  02/2017   c1-c2 ; reports she has 2 screws 2in long in place done at [facility] ; patient exhibits VERY LIMITED NECK ROM   COLONOSCOPY     CYSTOSCOPY W/ RETROGRADES Bilateral 09/22/2016   Procedure: CYSTOSCOPY WITH RETROGRADE PYELOGRAM;  Surgeon: Sebastian Ache, MD;  Location: [facility];  Service: Urology;  Laterality: Bilateral;   D & C HYSTEROSCOPY W/ RESECTION POLYP  07/11/2000   DILATION AND CURETTAGE OF UTERUS  1960's   EXCISION CYST AND DEBRIDEMENT RIGHT WIRST AND REMOVAL FORGEIGN BODY  01/09/2009   FLEXOR TENDON REPAIR Left 12/12/2018   Procedure: REPAIR/TRANSFER FLEXOR DIGITORUM PROFUNDUS OF LEFT SMALL FINGER;  Surgeon: Cindee Salt, MD;  Location: [facility];  Service: Orthopedics;  Laterality: Left;   LAPAROSCOPY  yrs ago   infertility    METACARPOPHALANGEAL JOINT ARTHRODESIS Right 05/25/1996   REVERSE SHOULDER ARTHROPLASTY Left 10/14/2022   Procedure: REVERSE SHOULDER ARTHROPLASTY;  Surgeon: Francena Hanly, MD;  Location: [facility];  Service: Orthopedics;  Laterality: Left;    TENDON REPAIR Left 01/15/2019   Hand   TONSILLECTOMY AND ADENOIDECTOMY  child   TOTAL HIP ARTHROPLASTY Left 07/07/2016   Procedure: LEFT TOTAL HIP ARTHROPLASTY ANTERIOR APPROACH;  Surgeon: Ollen Gross, MD;  Location: [facility];  Service: Orthopedics;  Laterality: Left;   TOTAL HIP ARTHROPLASTY Right 09/28/2017   Procedure: RIGHT TOTAL HIP ARTHROPLASTY ANTERIOR APPROACH;  Surgeon: Ollen Gross, MD;  Location: [facility];  Service: Orthopedics;  Laterality: Right;   TOTAL KNEE ARTHROPLASTY Right 07/30/2019   Procedure: TOTAL KNEE ARTHROPLASTY;  Surgeon: Ollen Gross, MD;  Location: [facility];  Service: Orthopedics;  Laterality: Right;    TRANSURETHRAL RESECTION  OF BLADDER TUMOR N/A 09/22/2016   Procedure: TRANSURETHRAL RESECTION OF BLADDER TUMOR (TURBT);  Surgeon: Sebastian Ache, MD;  Location: [facility];  Service: Urology;  Laterality: N/A;    Family History  Problem Relation Age of Onset   Arthritis Mother    Hypertension Mother    Deep vein thrombosis Mother        recurrent, secondary to Bleeding disorder   Arthritis Father    Stroke Father    Bladder Cancer Father    Diabetes Sister    Heart disease Sister    Obesity Sister    Arthritis Sister    Hypertension Sister    Heart disease Maternal Grandmother    Heart disease Maternal Grandfather    Peripheral vascular disease Paternal Grandmother        s/p leg amputation   Stroke Paternal Grandfather    Esophageal cancer Paternal Aunt    Colon cancer Neg Hx    Colon polyps Neg Hx    Rectal cancer Neg Hx    Stomach cancer Neg Hx     Social History   Socioeconomic History   Marital status: Widowed    Spouse name: Not on file   Number of children: Not on file   Years of education: Not on file   Highest education level: Bachelor's degree (e.g., BA, AB, BS)  Occupational History   Not on file  Tobacco Use   Smoking status: Former    Years: 10    Types: Cigarettes    Quit date: 05/04/1964    Years since quitting: 58.8   Smokeless tobacco: Never   Tobacco comments:    Pt states that when she was smoking 1 pack of cigs would last her 3 days. 10/13/22 ALS   Vaping Use   Vaping Use: Never used  Substance and Sexual Activity   Alcohol use: Yes    Alcohol/week: 7.0 standard drinks of alcohol    Types: 7 Glasses of wine per week    Comment: ocassionally- social    Drug use: No   Sexual activity: Not Currently    Partners: Male  Other Topics Concern   Not on file  Social History Narrative   Not on file   Social Determinants of Health   Financial Resource Strain: Low Risk  (04/27/2021)   Overall Financial Resource Strain (CARDIA)    Difficulty of Paying  Living Expenses: Not hard at all  Food Insecurity: No Food Insecurity (02/25/2023)   Hunger Vital Sign    Worried About Running Out of Food in the Last Year: Never true    Ran Out of Food in the Last Year: Never true  Transportation Needs: No Transportation Needs (02/25/2023)   PRAPARE - Administrator, Civil Service (Medical): No  Lack of Transportation (Non-Medical): No  Physical Activity: Inactive (02/25/2023)   Exercise Vital Sign    Days of Exercise per Week: 0 days    Minutes of Exercise per Session: 10 min  Stress: No Stress Concern Present (02/25/2023)   [employer] of Occupational Health - Occupational Stress Questionnaire    Feeling of Stress : Only a little  Recent Concern: Stress - Stress Concern Present (02/14/2023)   [employer] of Occupational Health - Occupational Stress Questionnaire    Feeling of Stress : To some extent  Social Connections: Unknown (02/25/2023)   Social Connection and Isolation Panel [NHANES]    Frequency of Communication with Friends and Family: More than three times a week    Frequency of Social Gatherings with Friends and Family: Twice a week    Attends Religious Services: 1 to 4 times per year    Active Member of [employer] or Organizations: Not on file    Attends Banker Meetings: Not on file    Marital Status: Widowed  Intimate Partner Violence: Patient Declined (10/14/2022)   Humiliation, Afraid, Rape, and Kick questionnaire    Fear of Current or Ex-Partner: Patient declined    Emotionally Abused: Patient declined    Physically Abused: Patient declined    Sexually Abused: Patient declined    Outpatient Medications Prior to Visit  Medication Sig Dispense Refill   amLODipine (NORVASC) 5 MG tablet TAKE 1 TABLET (5 MG TOTAL) BY MOUTH DAILY. 90 tablet 3   Apoaequorin (PREVAGEN PO) Take 1 tablet by mouth in the morning.     carbidopa-levodopa (SINEMET IR) 25-100 MG per tablet Take 0.5-1 tablets by mouth See admin  instructions. Take 1 tablet by mouth in the morning upon waking & take 0.5 tablet by mouth every 2 hours thereafter for a total of 6 tablets daily.     Carbidopa-Levodopa ER (SINEMET CR) 25-100 MG tablet controlled release Take 1 tablet by mouth at bedtime.     cholecalciferol (VITAMIN D3) 25 MCG (1000 UNIT) tablet Take 1,000 Units by mouth in the morning.     Coenzyme Q10 (COQ10 PO) Take 1 tablet by mouth in the morning.     diazepam (VALIUM) 5 MG tablet TAKE 1 TABLET (5 MG TOTAL) BY MOUTH EVERY 12 (TWELVE) HOURS AS NEEDED. FOR ANXIETY 60 tablet 1   DIGESTIVE ENZYMES PO Take 1 capsule by mouth 3 (three) times daily before meals. Active Enzymes by Leotis Shames     EPINEPHrine 0.3 mg/0.3 mL IJ SOAJ injection Inject 0.3 mg into the muscle as needed for anaphylaxis.     fluticasone (CUTIVATE) 0.05 % cream Apply 1 application topically daily as needed (scalp psoriasis).  1   golimumab (SIMPONI ARIA) 50 MG/4ML SOLN injection Inject 50 mg into the vein See admin instructions. Given every 2 months - Last given 06/28/2019     hydrochlorothiazide (HYDRODIURIL) 25 MG tablet Take 1 tablet (25 mg total) by mouth daily. 90 tablet 1   melatonin 5 MG TABS Take 5 mg by mouth at bedtime.     meloxicam (MOBIC) 15 MG tablet Take 15 mg by mouth daily as needed (joint pain (max 3-4 times/weekly)).     Multiple Vitamin (MULTIVITAMIN WITH MINERALS) TABS tablet Take 1 tablet by mouth in the morning. Centrum Silver     Polyethyl Glycol-Propyl Glycol (SYSTANE) 0.4-0.3 % SOLN Place 1-2 drops into both eyes 3 (three) times daily as needed (dry/irritated eyes.).     Probiotic Product (PROBIOTIC PO) Take 1 capsule  by mouth in the morning and at bedtime. Restore Ultimate Probiotic     estradiol (ESTRACE) 0.5 MG tablet TAKE 1/2 OF A TABLET (0.25 MG TOTAL) BY MOUTH EVERY DAY (Patient not taking: Reported on 02/21/2023) 45 tablet 1   medroxyPROGESTERone (PROVERA) 2.5 MG tablet TAKE 0.5 TABLETS BY MOUTH DAILY. (Patient not taking:  Reported on 02/21/2023) 45 tablet 3   tiZANidine (ZANAFLEX) 2 MG tablet Take 0.5-2 tablets (1-4 mg total) by mouth 2 (two) times daily as needed for muscle spasms. (Patient taking differently: Take 1 mg by mouth 3 (three) times daily. 1/2 tablet (she is not certain of the strength of the tablet)) 30 tablet 2   umeclidinium-vilanterol (ANORO ELLIPTA) 62.5-25 MCG/ACT AEPB Inhale 1 puff into the lungs daily. (Patient not taking: Reported on 02/21/2023) 1 each 3   No facility-administered medications prior to visit.    Allergies  Allergen Reactions   Cleocin [Clindamycin] Diarrhea   Codeine Nausea And Vomiting   Hornet Venom    Penicillins Itching and Swelling    Has patient had a PCN reaction causing immediate rash, facial/tongue/throat swelling, SOB or lightheadedness with hypotension: Yes Has patient had a PCN reaction causing severe rash involving mucus membranes or skin necrosis: No Has patient had a PCN reaction that required hospitalization: No Has patient had a PCN reaction occurring within the last 10 years: No If all of the above answers are "NO", then may proceed with Cephalosporin use.    Vibramycin [Doxycycline] Diarrhea   Yellow Jacket Venom Swelling   Lodine [Etodolac] Rash   Tramadol Itching and Rash    Review of Systems  Constitutional:  Negative for fever and malaise/fatigue.  HENT:  Negative for congestion.   Eyes:  Negative for blurred vision.  Respiratory:  Negative for shortness of breath.   Cardiovascular:  Negative for chest pain, palpitations and leg swelling.  Gastrointestinal:  Negative for abdominal pain, blood in stool and nausea.  Genitourinary:  Negative for dysuria and frequency.  Musculoskeletal:  Positive for joint pain, myalgias and neck pain. Negative for falls.  Skin:  Negative for rash.  Neurological:  Negative for dizziness, loss of consciousness and headaches.  Endo/Heme/Allergies:  Negative for environmental allergies.  Psychiatric/Behavioral:   Negative for depression. The patient is not nervous/anxious.        Objective:    Physical Exam Constitutional:      General: She is not in acute distress.    Appearance: Normal appearance. She is well-developed. She is not toxic-appearing.  HENT:     Head: Normocephalic and atraumatic.     Right Ear: External ear normal.     Left Ear: External ear normal.     Nose: Nose normal.  Eyes:     General:        Right eye: No discharge.        Left eye: No discharge.     Conjunctiva/sclera: Conjunctivae normal.  Neck:     Thyroid: No thyromegaly.  Cardiovascular:     Rate and Rhythm: Normal rate and regular rhythm.     Heart sounds: Normal heart sounds. No murmur heard. Pulmonary:     Effort: Pulmonary effort is normal. No respiratory distress.     Breath sounds: Normal breath sounds.  Abdominal:     General: Bowel sounds are normal.     Palpations: Abdomen is soft.     Tenderness: There is no abdominal tenderness. There is no guarding.  Musculoskeletal:        General:  Normal range of motion.     Cervical back: Neck supple.  Lymphadenopathy:     Cervical: No cervical adenopathy.  Skin:    General: Skin is warm and dry.  Neurological:     Mental Status: She is alert and oriented to person, place, and time.  Psychiatric:        Mood and Affect: Mood normal.        Behavior: Behavior normal.        Thought Content: Thought content normal.        Judgment: Judgment normal.   LMP 01/13/2013 Comment: spotting-had benign endometrial biopsy  Wt Readings from Last 3 Encounters:  11/29/22 119 lb (54 kg)  11/25/22 111 lb (50.3 kg)  11/12/22 111 lb (50.3 kg)    Diabetic Foot Exam - Simple   No data filed    Lab Results  Component Value Date   WBC 7.4 11/12/2022   HGB 13.5 11/12/2022   HCT 39.2 11/12/2022   PLT 234 11/12/2022   GLUCOSE 121 (H) 11/19/2022   CHOL 163 08/02/2022   TRIG 89.0 08/02/2022   HDL 60.20 08/02/2022   LDLDIRECT 120.0 12/11/2014   LDLCALC 85  08/02/2022   ALT 5 11/11/2022   AST 16 11/11/2022   NA 133 (L) 11/19/2022   K 3.7 11/19/2022   CL 94 (L) 11/19/2022   CREATININE 0.79 11/19/2022   BUN 17 11/19/2022   CO2 31 11/19/2022   TSH 1.32 08/02/2022   INR 1.0 07/25/2019   HGBA1C 5.1 02/14/2020    Lab Results  Component Value Date   TSH 1.32 08/02/2022   Lab Results  Component Value Date   WBC 7.4 11/12/2022   HGB 13.5 11/12/2022   HCT 39.2 11/12/2022   MCV 86.9 11/12/2022   PLT 234 11/12/2022   Lab Results  Component Value Date   NA 133 (L) 11/19/2022   K 3.7 11/19/2022   CO2 31 11/19/2022   GLUCOSE 121 (H) 11/19/2022   BUN 17 11/19/2022   CREATININE 0.79 11/19/2022   BILITOT 0.8 11/11/2022   ALKPHOS 51 11/11/2022   AST 16 11/11/2022   ALT 5 11/11/2022   PROT 6.5 11/11/2022   ALBUMIN 4.6 11/11/2022   CALCIUM 9.6 11/19/2022   ANIONGAP 11 11/12/2022   GFR 66.58 11/19/2022   Lab Results  Component Value Date   CHOL 163 08/02/2022   Lab Results  Component Value Date   HDL 60.20 08/02/2022   Lab Results  Component Value Date   LDLCALC 85 08/02/2022   Lab Results  Component Value Date   TRIG 89.0 08/02/2022   Lab Results  Component Value Date   CHOLHDL 3 08/02/2022   Lab Results  Component Value Date   HGBA1C 5.1 02/14/2020       Assessment & Plan:  Primary hypertension Assessment & Plan: Well controlled, no changes to meds. Encouraged heart healthy diet such as the DASH diet and exercise as tolerated.     Pure hypercholesterolemia Assessment & Plan: Tolerating statin, encouraged heart healthy diet, avoid trans fats, minimize simple carbs and saturated fats. Increase exercise as tolerated   Parkinson's disease, unspecified whether dyskinesia present, unspecified whether manifestations fluctuate Assessment & Plan: Continues to follow with neurology and is maintained on Sinemet   Vitamin D deficiency Assessment & Plan: Supplement and monitor      Danise Edge, MD

## 2023-03-02 ENCOUNTER — Other Ambulatory Visit: Payer: Self-pay | Admitting: Family Medicine

## 2023-03-02 LAB — URINE CULTURE
MICRO NUMBER:: 14824866
SPECIMEN QUALITY:: ADEQUATE

## 2023-03-02 MED ORDER — SULFAMETHOXAZOLE-TRIMETHOPRIM 800-160 MG PO TABS: 1.0000 | ORAL_TABLET | Freq: Two times a day (BID) | ORAL | 0 refills | Status: DC

## 2023-03-03 ENCOUNTER — Ambulatory Visit: Payer: Medicare Other | Admitting: Physical Therapy

## 2023-03-03 ENCOUNTER — Encounter: Payer: Self-pay | Admitting: Physical Therapy

## 2023-03-03 DIAGNOSIS — R293 Abnormal posture: Secondary | ICD-10-CM | POA: Diagnosis not present

## 2023-03-03 DIAGNOSIS — M542 Cervicalgia: Secondary | ICD-10-CM | POA: Diagnosis not present

## 2023-03-03 DIAGNOSIS — M62838 Other muscle spasm: Secondary | ICD-10-CM

## 2023-03-03 DIAGNOSIS — M6281 Muscle weakness (generalized): Secondary | ICD-10-CM

## 2023-03-03 LAB — DRUG TOX MONITOR 1 W/CONF, ORAL FLD
Alprazolam: NEGATIVE ng/mL (ref <0.50)
Amphetamines: NEGATIVE ng/mL (ref <10)
Barbiturates: NEGATIVE ng/mL (ref <10)
Benzodiazepines: POSITIVE ng/mL — AB (ref <0.50)
Buprenorphine: NEGATIVE ng/mL (ref <0.10)
Chlordiazepoxide: NEGATIVE ng/mL (ref <0.50)
Clonazepam: NEGATIVE ng/mL (ref <0.50)
Cocaine: NEGATIVE ng/mL (ref <5.0)
Diazepam: 1.32 ng/mL — ABNORMAL HIGH (ref <0.50)
Fentanyl: NEGATIVE ng/mL (ref <0.10)
Flunitrazepam: NEGATIVE ng/mL (ref <0.50)
Flurazepam: NEGATIVE ng/mL (ref <0.50)
Heroin Metabolite: NEGATIVE ng/mL (ref <1.0)
Lorazepam: NEGATIVE ng/mL (ref <0.50)
MARIJUANA: NEGATIVE ng/mL (ref <2.5)
MDMA: NEGATIVE ng/mL (ref <10)
Meprobamate: NEGATIVE ng/mL (ref <2.5)
Methadone: NEGATIVE ng/mL (ref <5.0)
Midazolam: NEGATIVE ng/mL (ref <0.50)
Nicotine Metabolite: NEGATIVE ng/mL (ref <5.0)
Nordiazepam: 3.32 ng/mL — ABNORMAL HIGH (ref <0.50)
Opiates: NEGATIVE ng/mL (ref <2.5)
Oxazepam: NEGATIVE ng/mL (ref <0.50)
Phencyclidine: NEGATIVE ng/mL (ref <10)
Tapentadol: NEGATIVE ng/mL (ref <5.0)
Temazepam: NEGATIVE ng/mL (ref <0.50)
Tramadol: NEGATIVE ng/mL (ref <5.0)
Triazolam: NEGATIVE ng/mL (ref <0.50)
Zolpidem: NEGATIVE ng/mL (ref <5.0)

## 2023-03-03 NOTE — Therapy (Signed)
OUTPATIENT PHYSICAL THERAPY TREATMENT   Patient Name: Stephanie [name] MRN: [id number] DOB:1932-02-24, 89 y.o., female Today's Date: 03/03/2023   END OF SESSION:  PT End of Session - 03/03/23 1711     Visit Number 2    Date for PT Re-Evaluation 04/18/23    Authorization Type Medicare & BCBS    PT Start Time 1711    PT Stop Time 1814    PT Time Calculation (min) 63 min    Activity Tolerance Patient tolerated treatment well;Patient limited by pain    Behavior During Therapy [facility] for tasks assessed/performed             Past Medical History:  Diagnosis Date   Allergy    Anemia    past hx    Arthropathy of cervical spine 11/27/2012   Right  Xray at [facility]  On 04/08/2016  AP, lateral, and lateral flexion and extension views of the cervical spine are submitted for evaluation.   No acute fracture identified in the cervical spine.   There is redemonstration of approximately 2 mm of C3 on C4 anterolisthesis in neutral which slightly increases in flexion relative to neutral and extension. Slight C5 on C6 retrolisthesis does not change i   Benign hypertension without congestive heart failure    Benign mole    right hcets mole, bleeds when dried with towel   Bilateral dry eyes    Bladder cancer Nov 04, 2016   Cervical spondylosis    Cervicalgia    2 screws in place    Depression with anxiety 2016-11-04   prior to husbands death    Dyspnea    Gross hematuria    developed after first hip replacment, led to indicental finding of bladder tumor, bleeding now resolved    H/O total shoulder replacement, left    History of kidney stones    1970's   Left hand weakness    due to reinjury of flxor tendon s/p tendon surgery march 12-2018   Lumbar stenosis    Malignant tumor of urinary bladder 07/2016   Pre-stage I   OA (osteoarthritis)    Parkinson disease dx 2012   neurologist-  dr siddiqui at [facility]   Psoriatic arthritis     Dr Nickola Major , [place] [facility]    Rupture of flexor tendon of hand  2020   right   Past Surgical History:  Procedure Laterality Date   BIOPSY MASS LEFT FIRST METACARPAL   06/23/2006   benign   CATARACT EXTRACTION W/ INTRAOCULAR LENS  IMPLANT, BILATERAL  1999 and 2003   CERVICAL FUSION  02/2017   c1-c2 ; reports she has 2 screws 2in long in place done at [facility] ; patient exhibits VERY LIMITED NECK ROM   COLONOSCOPY     CYSTOSCOPY W/ RETROGRADES Bilateral 09/22/2016   Procedure: CYSTOSCOPY WITH RETROGRADE PYELOGRAM;  Surgeon: Sebastian Ache, MD;  Location: [facility];  Service: Urology;  Laterality: Bilateral;   D & C HYSTEROSCOPY W/ RESECTION POLYP  07/11/2000   DILATION AND CURETTAGE OF UTERUS  1960's   EXCISION CYST AND DEBRIDEMENT RIGHT WIRST AND REMOVAL FORGEIGN BODY  01/09/2009   FLEXOR TENDON REPAIR Left 12/12/2018   Procedure: REPAIR/TRANSFER FLEXOR DIGITORUM PROFUNDUS OF LEFT SMALL FINGER;  Surgeon: Cindee Salt, MD;  Location: [facility];  Service: Orthopedics;  Laterality: Left;   LAPAROSCOPY  yrs ago   infertility    METACARPOPHALANGEAL JOINT ARTHRODESIS Right 05/25/1996   REVERSE SHOULDER ARTHROPLASTY Left 10/14/2022  Procedure: REVERSE SHOULDER ARTHROPLASTY;  Surgeon: Francena Hanly, MD;  Location: [facility];  Service: Orthopedics;  Laterality: Left;    TENDON REPAIR Left 01/15/2019   Hand   TONSILLECTOMY AND ADENOIDECTOMY  child   TOTAL HIP ARTHROPLASTY Left 07/07/2016   Procedure: LEFT TOTAL HIP ARTHROPLASTY ANTERIOR APPROACH;  Surgeon: Ollen Gross, MD;  Location: [facility];  Service: Orthopedics;  Laterality: Left;   TOTAL HIP ARTHROPLASTY Right 09/28/2017   Procedure: RIGHT TOTAL HIP ARTHROPLASTY ANTERIOR APPROACH;  Surgeon: Ollen Gross, MD;  Location: [facility];  Service: Orthopedics;  Laterality: Right;   TOTAL KNEE ARTHROPLASTY Right 07/30/2019   Procedure: TOTAL KNEE ARTHROPLASTY;  Surgeon: Ollen Gross, MD;  Location: [facility];  Service: Orthopedics;  Laterality: Right;    TRANSURETHRAL  RESECTION OF BLADDER TUMOR N/A 09/22/2016   Procedure: TRANSURETHRAL RESECTION OF BLADDER TUMOR (TURBT);  Surgeon: Sebastian Ache, MD;  Location: [facility];  Service: Urology;  Laterality: N/A;   Patient Active Problem List   Diagnosis Date Noted   S/P reverse total shoulder arthroplasty, left 10/14/2022   Preoperative cardiovascular examination 10/04/2022   SOB (shortness of breath) 08/02/2022   Insomnia 08/02/2022   Hyponatremia 08/01/2022   Shoulder pain, left 09/14/2021   History of COVID-19 01/06/2021   Diarrhea 09/03/2020   Vitamin D deficiency 09/03/2020   OA (osteoarthritis) of knee 07/30/2019   Tinea corporis 06/08/2019   Psoriatic arthritis 04/02/2019   Anemia 03/13/2018   Hot flashes 03/13/2018   S/P cervical spinal fusion 03/31/2017   Neck pain 12/12/2016   Depression with anxiety 10/17/2016   Bladder cancer 10/17/2016   OA (osteoarthritis) of hip 07/07/2016   Spinal stenosis of lumbar region with radiculopathy 04/08/2016   Spondylolisthesis of lumbar region 04/08/2016   Spondylosis of cervical region without myelopathy or radiculopathy 04/08/2016   Preventative health care 01/18/2016   Low back pain 01/18/2016   History of chicken pox    Hyperlipidemia 12/15/2014   Allergic rhinitis 06/25/2014   Allergy to bee sting 06/10/2014   TMJ tenderness 11/16/2013   Other malaise and fatigue 05/07/2013   Arthropathy of cervical spine (HCC) 11/27/2012   Muscle spasms of neck 11/27/2012   Parkinson's disease 05/24/2012   Conjunctivochalasis 03/09/2012   Epiretinal membrane 03/09/2012   Hyperopia with astigmatism and presbyopia 03/09/2012   Pseudophakia 03/09/2012   HTN (hypertension) 08/31/2011   Right knee pain 08/31/2011   Benign hypertensive heart disease without heart failure 05/12/2011    PCP: Bradd Canary, MD   REFERRING PROVIDER: Romero Belling, MD   REFERRING DIAG: Multilevel cervical spondylosis, L neck & trapezial myofascial  pain/contracture   THERAPY DIAG:  Cervicalgia  Abnormal posture  Muscle weakness (generalized)  Other muscle spasm  RATIONALE FOR EVALUATION AND TREATMENT: Rehabilitation  ONSET DATE: acute on chronic  NEXT MD VISIT: ~June 2024   SUBJECTIVE:  SUBJECTIVE STATEMENT: Pt reports she had one day w/o pain or clicking but otherwise pain has been mostly constant. She does think the DN may have helped the muscles relax some but it did not help the pain or clicking much.  PAIN: Are you having pain? Yes: NPRS scale:  6/10 Pain location: L neck and scapula Pain description: constant sharp Aggravating factors: any movement of head and neck, dyskinesia from PD meds Relieving factors: sitting still in her recliner  PERTINENT HISTORY:  Chronic neck pain, L shoulder advanced OA s/p reverse TSA on 10/14/22; R TKA 07/2019, B THR 2018 & 2017, posterior C1-2 cervical fusion 2018, rupture of flexor tendon of L hand with failed surgical repair 2020, Parkinson's, psoriatic arthritis, bladder cancer, HTN    PRECAUTIONS: None  HAND DOMINANCE: Right  WEIGHT BEARING RESTRICTIONS: No  FALLS:  Has patient fallen in last 6 months? No  LIVING ENVIRONMENT: Lives with: lives alone Lives in: House/apartment Stairs: Yes: External: 4 steps; on right going up, on left going up, and can reach both Has following equipment at home: Single point cane, Walker - 2 wheeled, Shower bench, bed side commode, and Grab bars  OCCUPATION: Retired  [employer]: Independent and Leisure: volunteering at [employer], bible study, walking 1/3 mile daily - unable recently due to pain   PATIENT GOALS: "Get rid of this pain so I can go back to my normal activities."   OBJECTIVE: (objective measures completed at initial evaluation unless  otherwise dated)  DIAGNOSTIC FINDINGS:  Cervical MRI - results not available, but pt reports she was told she had severe OA  02/07/23 - Cervical spine x-ray: IMPRESSION: Multilevel degenerative change and postoperative change without acute abnormality.  PATIENT SURVEYS:  NDI 26 / 50 = 52.0 %  COGNITION: Overall cognitive status: Within functional limits for tasks assessed  SENSATION: WFL  POSTURE:  rounded shoulders, forward head, weight shift right, and head laterally shifted to R  PALPATION: Severe increased muscle tension and TTP in L>R UT, LS and cervical paraspinals as well as L periscapular muscles   CERVICAL ROM:   Active ROM AROM  eval  Flexion 46  Extension 22 *  Right lateral flexion 16 *  Left lateral flexion 10 *  Right rotation 16 *  Left rotation 18*   (Blank rows = not tested, * = pain) 02/21/23 - clicking with B rotation  UPPER EXTREMITY ROM:  Active ROM Right eval Left eval  Shoulder flexion 146 111  Shoulder extension 50 42  Shoulder abduction 155 118  Shoulder adduction    Shoulder internal rotation FIR to sacrum FIR to lateral buttock  Shoulder external rotation FER T3 FER T1  Elbow flexion    Elbow extension    Wrist flexion    Wrist extension    Wrist ulnar deviation    Wrist radial deviation    Wrist pronation    Wrist supination     (Blank rows = not tested)  UPPER EXTREMITY MMT:   MMT Right 03/03/23 Left 03/03/23  Shoulder flexion 4+ 4-  Shoulder extension 4+ 4+  Shoulder abduction 4+ 4  Shoulder adduction    Shoulder internal rotation 4+ 4  Shoulder external rotation 4 4  Middle trapezius    Lower trapezius    Elbow flexion    Elbow extension    Wrist flexion    Wrist extension    Wrist ulnar deviation    Wrist radial deviation    Wrist pronation    Wrist supination  Grip strength     (Blank rows = not tested)  CERVICAL SPECIAL TESTS:  Spurling's test: Positive and Distraction test: Positive    TODAY'S  TREATMENT:   03/03/23 THERAPEUTIC EXERCISE: to improve flexibility, strength and mobility.  Verbal and tactile cues throughout for technique.  UBE: L1.0 x 6 min (3' each fwd & back) - more comfortable going backwards Seated cervical retraction 10 x 5" Seated backwards shoulder rolls x 10 - pt reports this is one of the most helpful exercises  Seated scap retraction & depression 10 x 5" Seated R SCM stretch - pt having difficulty stabilizing clavicle due to her recent reverse TSA limiting her cross body reach as well as advance arthritis and torn tendon in her L hand, therefore deferred from HEP for now Seated first rib mobilization with towel + UT stretch - difficulty isolating stretch without increased pain Verbally reviewed TB resisted rows and retraction but not attempted today - pt reports using YTB at home Pt reports she is also still completing some the shoulder exercises from her recent reverse TSA HEP  MANUAL THERAPY: To promote normalized muscle tension, improved flexibility, improved joint mobility, increased ROM, and reduced pain.  Seated STM/DTM, manual TPR and pin & stretch to B UT, LS, SCM and cervical paraspinals Cervical side glide and NAGs for B cervical rotation  MODALITIES: IFC to B UT & LS, 80-150 Hz, intensity to patient tolerance x 15 min Moist heat to cervical spine and upper shoulders   02/21/23 Initial eval   MANUAL THERAPY: To promote normalized muscle tension, improved flexibility, improved joint mobility, increased ROM, and reduced pain. Skilled palpation and monitoring of soft tissue during DN Trigger Point Dry-Needling  Treatment instructions: Expect mild to moderate muscle soreness. S/S of pneumothorax if dry needled over a lung field, and to seek immediate medical attention should they occur. Patient verbalized understanding of these instructions and education. Patient Consent Given: Yes Education handout provided: Yes Muscles treated: B UT & LS Electrical  stimulation performed: No Parameters: N/A Treatment response/outcome: Twitch Response Elicited and Palpable Increase in Muscle Length STM/DTM, manual TPR and pin & stretch to muscles addressed with DN   MODALITIES: IFC to B UT & LS, 80-150 Hz, intensity to patient tolerance x 15 min Moist heat to cervical spine and upper shoulders   PATIENT EDUCATION:  Education details: HEP review and HEP modification - deferred SCM stretch   Person educated: Patient Education method: Explanation, Demonstration, and Handouts Education comprehension: verbalized understanding  HOME EXERCISE PROGRAM: Access Code: [email or web address] URL: [email or web address] Date: 03/03/2023 Prepared by: Glenetta Hew  Exercises - Seated Cervical Retraction  - 2-3 x daily - 7 x weekly - 2 sets - 10 reps - 3-5 sec hold - Shoulder Rolls in Sitting  - 2-3 x daily - 7 x weekly - 2 sets - 10 reps - 3 sec hold - Seated Scapular Retraction  - 2-3 x daily - 7 x weekly - 2 sets - 10 reps - 5 sec hold - Sternocleidomastoid Stretch  - 2-3 x daily - 7 x weekly - 3 reps - 30 sec hold (hold for now) - Standing Bilateral Low Shoulder Row with Anchored Resistance  - 1 x daily - 3-4 x weekly - 2 sets - 10 reps - 5 sec hold - Scapular Retraction with Resistance Advanced  - 1 x daily - 3-4 x weekly - 2 sets - 10 reps - 5 sec hold  The following exercises still being performed from her recent  reverse TSA HEP: - Seated Shoulder Flexion Full Range (Mirrored)  - 1 x daily - 3-4 x weekly - 2 sets - 10 reps - 3 sec hold - Seated Single Arm Shoulder Abduction - Thumb Up  - 1 x daily - 3-4 x weekly - 2 sets - 10 reps - 3 sec hold - Shoulder Internal Rotation with Resistance (Mirrored)  - 1 x daily - 3 x weekly - 1-2 sets - 10 reps - 3 sec hold - Shoulder External Rotation with Anchored Resistance  - 1 x daily - 3 x weekly - 1-2 sets - 10 reps - 3 sec hold   ASSESSMENT:  CLINICAL IMPRESSION: Randal reports she has one day recently w/o  pain (unable to determine what triggered the good day) but otherwise pain and clicking in her neck has been pretty constant.  She brought her previous HEPs from prior PT episodes, pointing out what she has been focusing on with home exercises - we reviewed these and tried a few other stretches but some difficult achieving the desired positions/holds. MT focusing on STM and gentle mobilization - able to decrease clicking and pain with NAGs but limited carryover w/o PT facilitation of movement.  Slight improvement noted in midline alignment of spine, however continued right shift evident.  Maryanna did note some benefit from DN on initial eval visit, hence may focus on this in upcoming visits to reduce muscle tension to allow for improved mobility/ROM tolerance.  OBJECTIVE IMPAIRMENTS: decreased activity tolerance, decreased endurance, decreased knowledge of condition, decreased mobility, difficulty walking, decreased ROM, decreased strength, hypomobility, increased fascial restrictions, impaired perceived functional ability, increased muscle spasms, impaired flexibility, impaired UE functional use, improper body mechanics, postural dysfunction, and pain.   ACTIVITY LIMITATIONS: carrying, lifting, standing, sleeping, transfers, bed mobility, bathing, toileting, dressing, reach over head, and hygiene/grooming  PARTICIPATION LIMITATIONS: meal prep, cleaning, laundry, driving, shopping, community activity, occupation, yard work, and church  PERSONAL FACTORS: Age, Fitness, Past/current experiences, Time since onset of injury/illness/exacerbation, and 3+ comorbidities: Chronic neck pain, L shoulder advanced OA s/p reverse TSA on 10/14/22; R TKA 07/2019, B THR 2018 & 2017, posterior C1-2 cervical fusion 2018, rupture of flexor tendon of L hand with failed surgical repair 2020, Parkinson's, psoriatic arthritis, bladder cancer, HTN    are also affecting patient's functional outcome.   REHAB POTENTIAL: Good  CLINICAL  DECISION MAKING: Evolving/moderate complexity  EVALUATION COMPLEXITY: Moderate   GOALS: Goals reviewed with patient? Yes  SHORT TERM GOALS: Target date: 03/21/2023  Patient will be independent with initial HEP to improve outcomes and carryover.  Baseline: Established 03/03/23 Goal status: IN PROGRESS  2.  Patient will report 25% improvement in neck pain to improve QOL.   Baseline: 8-10/10 Goal status: IN PROGRESS  LONG TERM GOALS: Target date: 04/18/2023  Patient will be independent with ongoing/advanced HEP for self-management at home.  Baseline:  Goal status: IN PROGRESS  2.  Patient will demonstrate improved posture to decrease muscle imbalance. Baseline: rounded shoulders, forward head, weight shift right, and head laterally shifted to R Goal status: IN PROGRESS  3.  Patient will report 50-75% improvement in neck pain to improve QOL.  Baseline: 8-10/10 Goal status: IN PROGRESS  4.  Patient will demonstrate functional pain free cervical ROM for safety with driving.  Baseline: refer to above cervical ROM table Goal status: IN PROGRESS  5.  Patient will report </= 37% on NDI to demonstrate improved functional ability.  Baseline: 26 / 50 = 52.0 % Goal status:  IN PROGRESS  6  Patient will report >/= 50% improvement in her sleep due to decreased neck pain. Baseline: Sleep greatly disturbed for up to 3-5 hrs due to neck pain per NDI Goal status: IN PROGRESS   7.  Patient will be able to resume attending church services w/o limitation due to neck pain. Baseline: Has been unable to attend church Goal status: IN PROGRESS   8. Patient will be able to resume walking for exercise w/o limitation due to neck pain. Baseline: Has been unable to walk d/t pain Goal status: IN PROGRESS   PLAN:  PT FREQUENCY: 2x/week  PT DURATION: 8 weeks  PLANNED INTERVENTIONS: Therapeutic exercises, Therapeutic activity, Neuromuscular re-education, Balance training, Patient/Family education,  Self Care, Joint mobilization, Dry Needling, Electrical stimulation, Spinal mobilization, Cryotherapy, Moist heat, Taping, Ultrasound, Ionotophoresis 4mg /ml Dexamethasone, Manual therapy, and Re-evaluation  PLAN FOR NEXT SESSION:  MT +/- DN as indicated and benefit noted; gentle cervical flexibility and ROM; postural correction and strengthening; review and progress HEP as able   Marry Guan, PT 03/03/2023, 6:25 PM

## 2023-03-07 DIAGNOSIS — G20A1 Parkinson's disease without dyskinesia, without mention of fluctuations: Secondary | ICD-10-CM | POA: Diagnosis not present

## 2023-03-10 ENCOUNTER — Ambulatory Visit: Payer: Medicare Other | Admitting: Physical Therapy

## 2023-03-10 DIAGNOSIS — R293 Abnormal posture: Secondary | ICD-10-CM

## 2023-03-10 DIAGNOSIS — M542 Cervicalgia: Secondary | ICD-10-CM

## 2023-03-10 DIAGNOSIS — M62838 Other muscle spasm: Secondary | ICD-10-CM | POA: Diagnosis not present

## 2023-03-10 DIAGNOSIS — M6281 Muscle weakness (generalized): Secondary | ICD-10-CM | POA: Diagnosis not present

## 2023-03-10 NOTE — Therapy (Signed)
OUTPATIENT PHYSICAL THERAPY TREATMENT   Patient Name: Stephanie [name] MRN: [id number] DOB:Mar 15, 1932, 89 y.o., female Today's Date: 03/10/2023   END OF SESSION:  PT End of Session - 03/10/23 1402     Visit Number 3    Date for PT Re-Evaluation 04/18/23    Authorization Type Medicare & BCBS    PT Start Time 1401    PT Stop Time 1445    PT Time Calculation (min) 44 min    Activity Tolerance Patient tolerated treatment well;Patient limited by pain    Behavior During Therapy [facility] for tasks assessed/performed             Past Medical History:  Diagnosis Date   Allergy    Anemia    past hx    Arthropathy of cervical spine 11/27/2012   Right  Xray at [facility]  On 04/08/2016  AP, lateral, and lateral flexion and extension views of the cervical spine are submitted for evaluation.   No acute fracture identified in the cervical spine.   There is redemonstration of approximately 2 mm of C3 on C4 anterolisthesis in neutral which slightly increases in flexion relative to neutral and extension. Slight C5 on C6 retrolisthesis does not change i   Benign hypertension without congestive heart failure    Benign mole    right hcets mole, bleeds when dried with towel   Bilateral dry eyes    Bladder cancer 11/11/2016   Cervical spondylosis    Cervicalgia    2 screws in place    Depression with anxiety 2016/11/11   prior to husbands death    Dyspnea    Gross hematuria    developed after first hip replacment, led to indicental finding of bladder tumor, bleeding now resolved    H/O total shoulder replacement, left    History of kidney stones    1970's   Left hand weakness    due to reinjury of flxor tendon s/p tendon surgery march 12-2018   Lumbar stenosis    Malignant tumor of urinary bladder 07/2016   Pre-stage I   OA (osteoarthritis)    Parkinson disease dx 2012   neurologist-  dr siddiqui at [facility]   Psoriatic arthritis     Dr Nickola Major , [place] [facility]    Rupture of flexor tendon of hand  2020   right   Past Surgical History:  Procedure Laterality Date   BIOPSY MASS LEFT FIRST METACARPAL   06/23/2006   benign   CATARACT EXTRACTION W/ INTRAOCULAR LENS  IMPLANT, BILATERAL  1999 and 2003   CERVICAL FUSION  02/2017   c1-c2 ; reports she has 2 screws 2in long in place done at [facility] ; patient exhibits VERY LIMITED NECK ROM   COLONOSCOPY     CYSTOSCOPY W/ RETROGRADES Bilateral 09/22/2016   Procedure: CYSTOSCOPY WITH RETROGRADE PYELOGRAM;  Surgeon: Sebastian Ache, MD;  Location: [facility];  Service: Urology;  Laterality: Bilateral;   D & C HYSTEROSCOPY W/ RESECTION POLYP  07/11/2000   DILATION AND CURETTAGE OF UTERUS  1960's   EXCISION CYST AND DEBRIDEMENT RIGHT WIRST AND REMOVAL FORGEIGN BODY  01/09/2009   FLEXOR TENDON REPAIR Left 12/12/2018   Procedure: REPAIR/TRANSFER FLEXOR DIGITORUM PROFUNDUS OF LEFT SMALL FINGER;  Surgeon: Cindee Salt, MD;  Location: [facility];  Service: Orthopedics;  Laterality: Left;   LAPAROSCOPY  yrs ago   infertility    METACARPOPHALANGEAL JOINT ARTHRODESIS Right 05/25/1996   REVERSE SHOULDER ARTHROPLASTY Left 10/14/2022  Procedure: REVERSE SHOULDER ARTHROPLASTY;  Surgeon: Francena Hanly, MD;  Location: [facility];  Service: Orthopedics;  Laterality: Left;    TENDON REPAIR Left 01/15/2019   Hand   TONSILLECTOMY AND ADENOIDECTOMY  child   TOTAL HIP ARTHROPLASTY Left 07/07/2016   Procedure: LEFT TOTAL HIP ARTHROPLASTY ANTERIOR APPROACH;  Surgeon: Ollen Gross, MD;  Location: [facility];  Service: Orthopedics;  Laterality: Left;   TOTAL HIP ARTHROPLASTY Right 09/28/2017   Procedure: RIGHT TOTAL HIP ARTHROPLASTY ANTERIOR APPROACH;  Surgeon: Ollen Gross, MD;  Location: [facility];  Service: Orthopedics;  Laterality: Right;   TOTAL KNEE ARTHROPLASTY Right 07/30/2019   Procedure: TOTAL KNEE ARTHROPLASTY;  Surgeon: Ollen Gross, MD;  Location: [facility];  Service: Orthopedics;  Laterality: Right;    TRANSURETHRAL  RESECTION OF BLADDER TUMOR N/A 09/22/2016   Procedure: TRANSURETHRAL RESECTION OF BLADDER TUMOR (TURBT);  Surgeon: Sebastian Ache, MD;  Location: [facility];  Service: Urology;  Laterality: N/A;   Patient Active Problem List   Diagnosis Date Noted   S/P reverse total shoulder arthroplasty, left 10/14/2022   Preoperative cardiovascular examination 10/04/2022   SOB (shortness of breath) 08/02/2022   Insomnia 08/02/2022   Hyponatremia 08/01/2022   Shoulder pain, left 09/14/2021   History of COVID-19 01/06/2021   Diarrhea 09/03/2020   Vitamin D deficiency 09/03/2020   OA (osteoarthritis) of knee 07/30/2019   Tinea corporis 06/08/2019   Psoriatic arthritis 04/02/2019   Anemia 03/13/2018   Hot flashes 03/13/2018   S/P cervical spinal fusion 03/31/2017   Neck pain 12/12/2016   Depression with anxiety 10/17/2016   Bladder cancer 10/17/2016   OA (osteoarthritis) of hip 07/07/2016   Spinal stenosis of lumbar region with radiculopathy 04/08/2016   Spondylolisthesis of lumbar region 04/08/2016   Spondylosis of cervical region without myelopathy or radiculopathy 04/08/2016   Preventative health care 01/18/2016   Low back pain 01/18/2016   History of chicken pox    Hyperlipidemia 12/15/2014   Allergic rhinitis 06/25/2014   Allergy to bee sting 06/10/2014   TMJ tenderness 11/16/2013   Other malaise and fatigue 05/07/2013   Arthropathy of cervical spine (HCC) 11/27/2012   Muscle spasms of neck 11/27/2012   Parkinson's disease 05/24/2012   Conjunctivochalasis 03/09/2012   Epiretinal membrane 03/09/2012   Hyperopia with astigmatism and presbyopia 03/09/2012   Pseudophakia 03/09/2012   HTN (hypertension) 08/31/2011   Right knee pain 08/31/2011   Benign hypertensive heart disease without heart failure 05/12/2011    PCP: Bradd Canary, MD   REFERRING PROVIDER: Romero Belling, MD   REFERRING DIAG: Multilevel cervical spondylosis, L neck & trapezial myofascial  pain/contracture   THERAPY DIAG:  Cervicalgia  Abnormal posture  Muscle weakness (generalized)  Other muscle spasm  RATIONALE FOR EVALUATION AND TREATMENT: Rehabilitation  ONSET DATE: acute on chronic  NEXT MD VISIT: ~June 2024   SUBJECTIVE:  SUBJECTIVE STATEMENT: Pt reports she is sleeping better but still having rough days (yesterday was bad). Pain starts near shoulder blade and goes up to base of head.  PAIN: Are you having pain? Yes: NPRS scale:  6/10 Pain location: L neck and scapula Pain description: constant sharp Aggravating factors: any movement of head and neck, dyskinesia from PD meds Relieving factors: sitting still in her recliner  PERTINENT HISTORY:  Chronic neck pain, L shoulder advanced OA s/p reverse TSA on 10/14/22; R TKA 07/2019, B THR 2018 & 2017, posterior C1-2 cervical fusion 2018, rupture of flexor tendon of L hand with failed surgical repair 2020, Parkinson's, psoriatic arthritis, bladder cancer, HTN    PRECAUTIONS: None  HAND DOMINANCE: Right  WEIGHT BEARING RESTRICTIONS: No  FALLS:  Has patient fallen in last 6 months? No  LIVING ENVIRONMENT: Lives with: lives alone Lives in: House/apartment Stairs: Yes: External: 4 steps; on right going up, on left going up, and can reach both Has following equipment at home: Single point cane, Walker - 2 wheeled, Shower bench, bed side commode, and Grab bars  OCCUPATION: Retired  [employer]: Independent and Leisure: volunteering at [employer], bible study, walking 1/3 mile daily - unable recently due to pain   PATIENT GOALS: "Get rid of this pain so I can go back to my normal activities."   OBJECTIVE: (objective measures completed at initial evaluation unless otherwise dated)  DIAGNOSTIC FINDINGS:  Cervical MRI -  results not available, but pt reports she was told she had severe OA  02/07/23 - Cervical spine x-ray: IMPRESSION: Multilevel degenerative change and postoperative change without acute abnormality.  PATIENT SURVEYS:  NDI 26 / 50 = 52.0 %  COGNITION: Overall cognitive status: Within functional limits for tasks assessed  SENSATION: WFL  POSTURE:  rounded shoulders, forward head, weight shift right, and head laterally shifted to R  PALPATION: Severe increased muscle tension and TTP in L>R UT, LS and cervical paraspinals as well as L periscapular muscles   CERVICAL ROM:   Active ROM AROM  eval  Flexion 46  Extension 22 *  Right lateral flexion 16 *  Left lateral flexion 10 *  Right rotation 16 *  Left rotation 18*   (Blank rows = not tested, * = pain) 02/21/23 - clicking with B rotation  UPPER EXTREMITY ROM:  Active ROM Right eval Left eval  Shoulder flexion 146 111  Shoulder extension 50 42  Shoulder abduction 155 118  Shoulder adduction    Shoulder internal rotation FIR to sacrum FIR to lateral buttock  Shoulder external rotation FER T3 FER T1  Elbow flexion    Elbow extension    Wrist flexion    Wrist extension    Wrist ulnar deviation    Wrist radial deviation    Wrist pronation    Wrist supination     (Blank rows = not tested)  UPPER EXTREMITY MMT:   MMT Right 03/03/23 Left 03/03/23  Shoulder flexion 4+ 4-  Shoulder extension 4+ 4+  Shoulder abduction 4+ 4  Shoulder adduction    Shoulder internal rotation 4+ 4  Shoulder external rotation 4 4  Middle trapezius    Lower trapezius    Elbow flexion    Elbow extension    Wrist flexion    Wrist extension    Wrist ulnar deviation    Wrist radial deviation    Wrist pronation    Wrist supination    Grip strength     (Blank rows = not tested)  CERVICAL SPECIAL TESTS:  Spurling's test: Positive and Distraction test: Positive    TODAY'S TREATMENT:   03/10/23 THERAPEUTIC EXERCISE: to improve  flexibility, strength and mobility.  Verbal and tactile cues throughout for technique.  UBE: L1.0 x 6 min (3' each fwd & back) - more comfortable going backwards  MANUAL THERAPY: To promote normalized muscle tension, improved flexibility, improved joint mobility, increased ROM, and reduced pain. Skilled palpation and monitoring of soft tissue during DN Trigger Point Dry-Needling  Treatment instructions: Expect mild to moderate muscle soreness. S/S of pneumothorax if dry needled over a lung field, and to seek immediate medical attention should they occur. Patient verbalized understanding of these instructions and education. Patient Consent Given: Yes Education handout provided: Previously provided Muscles treated: B cervical paraspinals, L scalenes, UT, LS, rhomboids & subscapularis Electrical stimulation performed: No Parameters: N/A Treatment response/outcome: Twitch Response Elicited and Palpable Increase in Muscle Length STM/DTM, manual TPR and pin & stretch to muscles addressed with DN Cervical side glide  L scapular mobs   03/03/23 THERAPEUTIC EXERCISE: to improve flexibility, strength and mobility.  Verbal and tactile cues throughout for technique.  UBE: L1.0 x 6 min (3' each fwd & back) - more comfortable going backwards Seated cervical retraction 10 x 5" Seated backwards shoulder rolls x 10 - pt reports this is one of the most helpful exercises  Seated scap retraction & depression 10 x 5" Seated R SCM stretch - pt having difficulty stabilizing clavicle due to her recent reverse TSA limiting her cross body reach as well as advance arthritis and torn tendon in her L hand, therefore deferred from HEP for now Seated first rib mobilization with towel + UT stretch - difficulty isolating stretch without increased pain Verbally reviewed TB resisted rows and retraction but not attempted today - pt reports using YTB at home Pt reports she is also still completing some the shoulder exercises  from her recent reverse TSA HEP  MANUAL THERAPY: To promote normalized muscle tension, improved flexibility, improved joint mobility, increased ROM, and reduced pain.  Seated STM/DTM, manual TPR and pin & stretch to B UT, LS, SCM and cervical paraspinals Cervical side glide and NAGs for B cervical rotation  MODALITIES: IFC to B UT & LS, 80-150 Hz, intensity to patient tolerance x 15 min Moist heat to cervical spine and upper shoulders   02/21/23 Initial eval   MANUAL THERAPY: To promote normalized muscle tension, improved flexibility, improved joint mobility, increased ROM, and reduced pain. Skilled palpation and monitoring of soft tissue during DN Trigger Point Dry-Needling  Treatment instructions: Expect mild to moderate muscle soreness. S/S of pneumothorax if dry needled over a lung field, and to seek immediate medical attention should they occur. Patient verbalized understanding of these instructions and education. Patient Consent Given: Yes Education handout provided: Yes Muscles treated: B UT & LS Electrical stimulation performed: No Parameters: N/A Treatment response/outcome: Twitch Response Elicited and Palpable Increase in Muscle Length STM/DTM, manual TPR and pin & stretch to muscles addressed with DN   MODALITIES: IFC to B UT & LS, 80-150 Hz, intensity to patient tolerance x 15 min Moist heat to cervical spine and upper shoulders   PATIENT EDUCATION:  Education details: HEP review and HEP modification - deferred SCM stretch   Person educated: Patient Education method: Explanation, Demonstration, and Handouts Education comprehension: verbalized understanding  HOME EXERCISE PROGRAM: Access Code: [email or web address] URL: [email or web address] Date: 03/03/2023 Prepared by: Glenetta Hew  Exercises - Seated Cervical Retraction  - 2-3 x daily -  7 x weekly - 2 sets - 10 reps - 3-5 sec hold - Shoulder Rolls in Sitting  - 2-3 x daily - 7 x weekly - 2 sets - 10 reps - 3  sec hold - Seated Scapular Retraction  - 2-3 x daily - 7 x weekly - 2 sets - 10 reps - 5 sec hold - Sternocleidomastoid Stretch  - 2-3 x daily - 7 x weekly - 3 reps - 30 sec hold (hold for now) - Standing Bilateral Low Shoulder Row with Anchored Resistance  - 1 x daily - 3-4 x weekly - 2 sets - 10 reps - 5 sec hold - Scapular Retraction with Resistance Advanced  - 1 x daily - 3-4 x weekly - 2 sets - 10 reps - 5 sec hold  The following exercises still being performed from her recent reverse TSA HEP: - Seated Shoulder Flexion Full Range (Mirrored)  - 1 x daily - 3-4 x weekly - 2 sets - 10 reps - 3 sec hold - Seated Single Arm Shoulder Abduction - Thumb Up  - 1 x daily - 3-4 x weekly - 2 sets - 10 reps - 3 sec hold - Shoulder Internal Rotation with Resistance (Mirrored)  - 1 x daily - 3 x weekly - 1-2 sets - 10 reps - 3 sec hold - Shoulder External Rotation with Anchored Resistance  - 1 x daily - 3 x weekly - 1-2 sets - 10 reps - 3 sec hold   ASSESSMENT:  CLINICAL IMPRESSION: Stephanie [name] reports her pain remains unpredictable with some good days and bad days (yesterday being a bad day), but does note sleeping better.  She remains very TTP with increased muscle tension throughout cervical and periscapular muscles, therefore proceeded with further MT incorporating DN.  She preferred for PT to focus on muscles on the left, but may also benefit from further DN to right upper shoulder and periscapular muscles as considerable increased tension also noted here.  Stephanie [name] will continue to benefit from skilled PT to address her pain, abnormal muscle tension, ROM and strength deficits to improve mobility and activity tolerance with decreased pain interference.   OBJECTIVE IMPAIRMENTS: decreased activity tolerance, decreased endurance, decreased knowledge of condition, decreased mobility, difficulty walking, decreased ROM, decreased strength, hypomobility, increased fascial restrictions, impaired perceived functional  ability, increased muscle spasms, impaired flexibility, impaired UE functional use, improper body mechanics, postural dysfunction, and pain.   ACTIVITY LIMITATIONS: carrying, lifting, standing, sleeping, transfers, bed mobility, bathing, toileting, dressing, reach over head, and hygiene/grooming  PARTICIPATION LIMITATIONS: meal prep, cleaning, laundry, driving, shopping, community activity, occupation, yard work, and church  PERSONAL FACTORS: Age, Fitness, Past/current experiences, Time since onset of injury/illness/exacerbation, and 3+ comorbidities: Chronic neck pain, L shoulder advanced OA s/p reverse TSA on 10/14/22; R TKA 07/2019, B THR 2018 & 2017, posterior C1-2 cervical fusion 2018, rupture of flexor tendon of L hand with failed surgical repair 2020, Parkinson's, psoriatic arthritis, bladder cancer, HTN    are also affecting patient's functional outcome.   REHAB POTENTIAL: Good  CLINICAL DECISION MAKING: Evolving/moderate complexity  EVALUATION COMPLEXITY: Moderate   GOALS: Goals reviewed with patient? Yes  SHORT TERM GOALS: Target date: 03/21/2023  Patient will be independent with initial HEP to improve outcomes and carryover.  Baseline: Established 03/03/23 Goal status: IN PROGRESS  2.  Patient will report 25% improvement in neck pain to improve QOL.   Baseline: 8-10/10 Goal status: IN PROGRESS  LONG TERM GOALS: Target date: 04/18/2023  Patient will be  independent with ongoing/advanced HEP for self-management at home.  Baseline:  Goal status: IN PROGRESS  2.  Patient will demonstrate improved posture to decrease muscle imbalance. Baseline: rounded shoulders, forward head, weight shift right, and head laterally shifted to R Goal status: IN PROGRESS  3.  Patient will report 50-75% improvement in neck pain to improve QOL.  Baseline: 8-10/10 Goal status: IN PROGRESS  4.  Patient will demonstrate functional pain free cervical ROM for safety with driving.  Baseline: refer to  above cervical ROM table Goal status: IN PROGRESS  5.  Patient will report </= 37% on NDI to demonstrate improved functional ability.  Baseline: 26 / 50 = 52.0 % Goal status: IN PROGRESS  6  Patient will report >/= 50% improvement in her sleep due to decreased neck pain. Baseline: Sleep greatly disturbed for up to 3-5 hrs due to neck pain per NDI Goal status: IN PROGRESS   7.  Patient will be able to resume attending church services w/o limitation due to neck pain. Baseline: Has been unable to attend church Goal status: IN PROGRESS   8. Patient will be able to resume walking for exercise w/o limitation due to neck pain. Baseline: Has been unable to walk d/t pain Goal status: IN PROGRESS   PLAN:  PT FREQUENCY: 2x/week  PT DURATION: 8 weeks  PLANNED INTERVENTIONS: Therapeutic exercises, Therapeutic activity, Neuromuscular re-education, Balance training, Patient/Family education, Self Care, Joint mobilization, Dry Needling, Electrical stimulation, Spinal mobilization, Cryotherapy, Moist heat, Taping, Ultrasound, Ionotophoresis 4mg /ml Dexamethasone, Manual therapy, and Re-evaluation  PLAN FOR NEXT SESSION:  MT +/- DN as indicated and benefit noted; gentle cervical flexibility and ROM; postural correction and strengthening; review and progress HEP as able   Marry Guan, PT 03/10/2023, 4:01 PM

## 2023-03-14 ENCOUNTER — Other Ambulatory Visit: Payer: Self-pay | Admitting: Family Medicine

## 2023-03-14 ENCOUNTER — Encounter: Payer: Self-pay | Admitting: Physical Therapy

## 2023-03-14 ENCOUNTER — Ambulatory Visit: Payer: Medicare Other | Admitting: Physical Therapy

## 2023-03-14 DIAGNOSIS — R293 Abnormal posture: Secondary | ICD-10-CM | POA: Diagnosis not present

## 2023-03-14 DIAGNOSIS — M542 Cervicalgia: Secondary | ICD-10-CM

## 2023-03-14 DIAGNOSIS — M6281 Muscle weakness (generalized): Secondary | ICD-10-CM | POA: Diagnosis not present

## 2023-03-14 DIAGNOSIS — M62838 Other muscle spasm: Secondary | ICD-10-CM

## 2023-03-14 NOTE — Therapy (Signed)
OUTPATIENT PHYSICAL THERAPY TREATMENT   Patient Name: Stephanie [name] MRN: [id number] DOB:05-Apr-1932, 89 y.o., female Today's Date: 03/14/2023   END OF SESSION:  PT End of Session - 03/14/23 1259     Visit Number 4    Date for PT Re-Evaluation 04/18/23    Authorization Type Medicare & BCBS    Progress Note Due on Visit 10    PT Start Time 1300    PT Stop Time 1345    PT Time Calculation (min) 45 min    Activity Tolerance Patient tolerated treatment well;Patient limited by pain    Behavior During Therapy [facility] for tasks assessed/performed             Past Medical History:  Diagnosis Date   Allergy    Anemia    past hx    Arthropathy of cervical spine 11/27/2012   Right  Xray at [facility]  On 04/08/2016  AP, lateral, and lateral flexion and extension views of the cervical spine are submitted for evaluation.   No acute fracture identified in the cervical spine.   There is redemonstration of approximately 2 mm of C3 on C4 anterolisthesis in neutral which slightly increases in flexion relative to neutral and extension. Slight C5 on C6 retrolisthesis does not change i   Benign hypertension without congestive heart failure    Benign mole    right hcets mole, bleeds when dried with towel   Bilateral dry eyes    Bladder cancer (HCC) 11-14-16   Cervical spondylosis    Cervicalgia    2 screws in place    Depression with anxiety 2016-11-14   prior to husbands death    Dyspnea    Gross hematuria    developed after first hip replacment, led to indicental finding of bladder tumor, bleeding now resolved    H/O total shoulder replacement, left    History of kidney stones    1970's   Left hand weakness    due to reinjury of flxor tendon s/p tendon surgery march 12-2018   Lumbar stenosis    Malignant tumor of urinary bladder (HCC) 07/2016   Pre-stage I   OA (osteoarthritis)    Parkinson disease dx 2012   neurologist-  dr siddiqui at [facility]   Psoriatic arthritis [facility])     Dr  Nickola Major , [place] [facility]    Rupture of flexor tendon of hand 2020   right   Past Surgical History:  Procedure Laterality Date   BIOPSY MASS LEFT FIRST METACARPAL   06/23/2006   benign   CATARACT EXTRACTION W/ INTRAOCULAR LENS  IMPLANT, BILATERAL  1999 and 2003   CERVICAL FUSION  02/2017   c1-c2 ; reports she has 2 screws 2in long in place done at [facility] ; patient exhibits VERY LIMITED NECK ROM   COLONOSCOPY     CYSTOSCOPY W/ RETROGRADES Bilateral 09/22/2016   Procedure: CYSTOSCOPY WITH RETROGRADE PYELOGRAM;  Surgeon: Sebastian Ache, MD;  Location: [facility];  Service: Urology;  Laterality: Bilateral;   D & C HYSTEROSCOPY W/ RESECTION POLYP  07/11/2000   DILATION AND CURETTAGE OF UTERUS  1960's   EXCISION CYST AND DEBRIDEMENT RIGHT WIRST AND REMOVAL FORGEIGN BODY  01/09/2009   FLEXOR TENDON REPAIR Left 12/12/2018   Procedure: REPAIR/TRANSFER FLEXOR DIGITORUM PROFUNDUS OF LEFT SMALL FINGER;  Surgeon: Cindee Salt, MD;  Location: [facility];  Service: Orthopedics;  Laterality: Left;   LAPAROSCOPY  yrs ago   infertility    METACARPOPHALANGEAL JOINT  ARTHRODESIS Right 05/25/1996   REVERSE SHOULDER ARTHROPLASTY Left 10/14/2022   Procedure: REVERSE SHOULDER ARTHROPLASTY;  Surgeon: Francena Hanly, MD;  Location: [facility];  Service: Orthopedics;  Laterality: Left;    TENDON REPAIR Left 01/15/2019   Hand   TONSILLECTOMY AND ADENOIDECTOMY  child   TOTAL HIP ARTHROPLASTY Left 07/07/2016   Procedure: LEFT TOTAL HIP ARTHROPLASTY ANTERIOR APPROACH;  Surgeon: Ollen Gross, MD;  Location: [facility];  Service: Orthopedics;  Laterality: Left;   TOTAL HIP ARTHROPLASTY Right 09/28/2017   Procedure: RIGHT TOTAL HIP ARTHROPLASTY ANTERIOR APPROACH;  Surgeon: Ollen Gross, MD;  Location: [facility];  Service: Orthopedics;  Laterality: Right;   TOTAL KNEE ARTHROPLASTY Right 07/30/2019   Procedure: TOTAL KNEE ARTHROPLASTY;  Surgeon: Ollen Gross, MD;  Location: [facility];  Service:  Orthopedics;  Laterality: Right;    TRANSURETHRAL RESECTION OF BLADDER TUMOR N/A 09/22/2016   Procedure: TRANSURETHRAL RESECTION OF BLADDER TUMOR (TURBT);  Surgeon: Sebastian Ache, MD;  Location: [facility];  Service: Urology;  Laterality: N/A;   Patient Active Problem List   Diagnosis Date Noted   S/P reverse total shoulder arthroplasty, left 10/14/2022   Preoperative cardiovascular examination 10/04/2022   SOB (shortness of breath) 08/02/2022   Insomnia 08/02/2022   Hyponatremia 08/01/2022   Shoulder pain, left 09/14/2021   History of COVID-19 01/06/2021   Diarrhea 09/03/2020   Vitamin D deficiency 09/03/2020   OA (osteoarthritis) of knee 07/30/2019   Tinea corporis 06/08/2019   Psoriatic arthritis (HCC) 04/02/2019   Anemia 03/13/2018   Hot flashes 03/13/2018   S/P cervical spinal fusion 03/31/2017   Neck pain 12/12/2016   Depression with anxiety 10/17/2016   Bladder cancer (HCC) 10/17/2016   OA (osteoarthritis) of hip 07/07/2016   Spinal stenosis of lumbar region with radiculopathy 04/08/2016   Spondylolisthesis of lumbar region 04/08/2016   Spondylosis of cervical region without myelopathy or radiculopathy 04/08/2016   Preventative health care 01/18/2016   Low back pain 01/18/2016   History of chicken pox    Hyperlipidemia 12/15/2014   Allergic rhinitis 06/25/2014   Allergy to bee sting 06/10/2014   TMJ tenderness 11/16/2013   Other malaise and fatigue 05/07/2013   Arthropathy of cervical spine (HCC) 11/27/2012   Muscle spasms of neck 11/27/2012   Parkinson's disease 05/24/2012   Conjunctivochalasis 03/09/2012   Epiretinal membrane 03/09/2012   Hyperopia with astigmatism and presbyopia 03/09/2012   Pseudophakia 03/09/2012   HTN (hypertension) 08/31/2011   Right knee pain 08/31/2011   Benign hypertensive heart disease without heart failure 05/12/2011    PCP: Bradd Canary, MD   REFERRING PROVIDER: Romero Belling, MD   REFERRING DIAG:  Multilevel cervical spondylosis, L neck & trapezial myofascial pain/contracture   THERAPY DIAG:  Cervicalgia  Abnormal posture  Muscle weakness (generalized)  Other muscle spasm  RATIONALE FOR EVALUATION AND TREATMENT: Rehabilitation  ONSET DATE: acute on chronic  NEXT MD VISIT: ~June 2024   SUBJECTIVE:  SUBJECTIVE STATEMENT: Burgess Estelle was awful. All day I was in pain. Today is really bad too.  PAIN: Are you having pain? Yes: NPRS scale:  6/10 Pain location: L neck and scapula Pain description: constant sharp Aggravating factors: any movement of head and neck, dyskinesia from PD meds Relieving factors: sitting still in her recliner  PERTINENT HISTORY:  Chronic neck pain, L shoulder advanced OA s/p reverse TSA on 10/14/22; R TKA 07/2019, B THR 2018 & 2017, posterior C1-2 cervical fusion 2018, rupture of flexor tendon of L hand with failed surgical repair 2020, Parkinson's, psoriatic arthritis, bladder cancer, HTN    PRECAUTIONS: None  HAND DOMINANCE: Right  WEIGHT BEARING RESTRICTIONS: No  FALLS:  Has patient fallen in last 6 months? No  LIVING ENVIRONMENT: Lives with: lives alone Lives in: House/apartment Stairs: Yes: External: 4 steps; on right going up, on left going up, and can reach both Has following equipment at home: Single point cane, Walker - 2 wheeled, Shower bench, bed side commode, and Grab bars  OCCUPATION: Retired  [employer]: Independent and Leisure: volunteering at [employer], bible study, walking 1/3 mile daily - unable recently due to pain   PATIENT GOALS: "Get rid of this pain so I can go back to my normal activities."   OBJECTIVE: (objective measures completed at initial evaluation unless otherwise dated)  DIAGNOSTIC FINDINGS:  Cervical MRI - results not  available, but pt reports she was told she had severe OA  02/07/23 - Cervical spine x-ray: IMPRESSION: Multilevel degenerative change and postoperative change without acute abnormality.  PATIENT SURVEYS:  NDI 26 / 50 = 52.0 %  COGNITION: Overall cognitive status: Within functional limits for tasks assessed  SENSATION: WFL  POSTURE:  rounded shoulders, forward head, weight shift right, and head laterally shifted to R  PALPATION: Severe increased muscle tension and TTP in L>R UT, LS and cervical paraspinals as well as L periscapular muscles   CERVICAL ROM:   Active ROM AROM  eval  Flexion 46  Extension 22 *  Right lateral flexion 16 *  Left lateral flexion 10 *  Right rotation 16 *  Left rotation 18*   (Blank rows = not tested, * = pain) 02/21/23 - clicking with B rotation  UPPER EXTREMITY ROM:  Active ROM Right eval Left eval  Shoulder flexion 146 111  Shoulder extension 50 42  Shoulder abduction 155 118  Shoulder adduction    Shoulder internal rotation FIR to sacrum FIR to lateral buttock  Shoulder external rotation FER T3 FER T1  Elbow flexion    Elbow extension    Wrist flexion    Wrist extension    Wrist ulnar deviation    Wrist radial deviation    Wrist pronation    Wrist supination     (Blank rows = not tested)  UPPER EXTREMITY MMT:   MMT Right 03/03/23 Left 03/03/23  Shoulder flexion 4+ 4-  Shoulder extension 4+ 4+  Shoulder abduction 4+ 4  Shoulder adduction    Shoulder internal rotation 4+ 4  Shoulder external rotation 4 4  Middle trapezius    Lower trapezius    Elbow flexion    Elbow extension    Wrist flexion    Wrist extension    Wrist ulnar deviation    Wrist radial deviation    Wrist pronation    Wrist supination    Grip strength     (Blank rows = not tested)  CERVICAL SPECIAL TESTS:  Spurling's test: Positive and Distraction test: Positive  TODAY'S TREATMENT:  02/2923 THERAPEUTIC EXERCISE: to improve flexibility, strength  and mobility.  Verbal and tactile cues throughout for technique.  UBE: L1.0 x 6 min (3' each fwd & back) - more comfortable going backwards R S/L stretch for L SCM - towel under neck, head on low pillow; static stretch x Supine active left rotation 5 sec hold x 5, also tried with TPR to SCM - too painful  MANUAL THERAPY: To promote normalized muscle tension, improved flexibility, improved joint mobility, increased ROM, and reduced pain. IASTM with EDGE tool to B UT, LS, cervical multifidi; L scalenes, rhomboids, SCM, TPR to SCM in supine   03/10/23 THERAPEUTIC EXERCISE: to improve flexibility, strength and mobility.  Verbal and tactile cues throughout for technique.  UBE: L1.0 x 6 min (3' each fwd & back) - more comfortable going backwards  MANUAL THERAPY: To promote normalized muscle tension, improved flexibility, improved joint mobility, increased ROM, and reduced pain. Skilled palpation and monitoring of soft tissue during DN Trigger Point Dry-Needling  Treatment instructions: Expect mild to moderate muscle soreness. S/S of pneumothorax if dry needled over a lung field, and to seek immediate medical attention should they occur. Patient verbalized understanding of these instructions and education. Patient Consent Given: Yes Education handout provided: Previously provided Muscles treated: B cervical paraspinals, L scalenes, UT, LS, rhomboids & subscapularis Electrical stimulation performed: No Parameters: N/A Treatment response/outcome: Twitch Response Elicited and Palpable Increase in Muscle Length STM/DTM, manual TPR and pin & stretch to muscles addressed with DN Cervical side glide  L scapular mobs   03/03/23 THERAPEUTIC EXERCISE: to improve flexibility, strength and mobility.  Verbal and tactile cues throughout for technique.  UBE: L1.0 x 6 min (3' each fwd & back) - more comfortable going backwards Seated cervical retraction 10 x 5" Seated backwards shoulder rolls x 10 - pt  reports this is one of the most helpful exercises  Seated scap retraction & depression 10 x 5" Seated R SCM stretch - pt having difficulty stabilizing clavicle due to her recent reverse TSA limiting her cross body reach as well as advance arthritis and torn tendon in her L hand, therefore deferred from HEP for now Seated first rib mobilization with towel + UT stretch - difficulty isolating stretch without increased pain Verbally reviewed TB resisted rows and retraction but not attempted today - pt reports using YTB at home Pt reports she is also still completing some the shoulder exercises from her recent reverse TSA HEP  MANUAL THERAPY: To promote normalized muscle tension, improved flexibility, improved joint mobility, increased ROM, and reduced pain.  Seated STM/DTM, manual TPR and pin & stretch to B UT, LS, SCM and cervical paraspinals Cervical side glide and NAGs for B cervical rotation  MODALITIES: IFC to B UT & LS, 80-150 Hz, intensity to patient tolerance x 15 min Moist heat to cervical spine and upper shoulders   02/21/23 Initial eval   MANUAL THERAPY: To promote normalized muscle tension, improved flexibility, improved joint mobility, increased ROM, and reduced pain. Skilled palpation and monitoring of soft tissue during DN Trigger Point Dry-Needling  Treatment instructions: Expect mild to moderate muscle soreness. S/S of pneumothorax if dry needled over a lung field, and to seek immediate medical attention should they occur. Patient verbalized understanding of these instructions and education. Patient Consent Given: Yes Education handout provided: Yes Muscles treated: B UT & LS Electrical stimulation performed: No Parameters: N/A Treatment response/outcome: Twitch Response Elicited and Palpable Increase in Muscle Length STM/DTM, manual  TPR and pin & stretch to muscles addressed with DN   MODALITIES: IFC to B UT & LS, 80-150 Hz, intensity to patient tolerance x 15 min Moist  heat to cervical spine and upper shoulders   PATIENT EDUCATION:  Education details: HEP review and HEP modification - deferred SCM stretch   Person educated: Patient Education method: Explanation, Demonstration, and Handouts Education comprehension: verbalized understanding  HOME EXERCISE PROGRAM: Access Code: [email or web address] URL: [email or web address] Date: 03/03/2023 Prepared by: Glenetta Hew  Exercises - Seated Cervical Retraction  - 2-3 x daily - 7 x weekly - 2 sets - 10 reps - 3-5 sec hold - Shoulder Rolls in Sitting  - 2-3 x daily - 7 x weekly - 2 sets - 10 reps - 3 sec hold - Seated Scapular Retraction  - 2-3 x daily - 7 x weekly - 2 sets - 10 reps - 5 sec hold - Sternocleidomastoid Stretch  - 2-3 x daily - 7 x weekly - 3 reps - 30 sec hold (hold for now) - Standing Bilateral Low Shoulder Row with Anchored Resistance  - 1 x daily - 3-4 x weekly - 2 sets - 10 reps - 5 sec hold - Scapular Retraction with Resistance Advanced  - 1 x daily - 3-4 x weekly - 2 sets - 10 reps - 5 sec hold  The following exercises still being performed from her recent reverse TSA HEP: - Seated Shoulder Flexion Full Range (Mirrored)  - 1 x daily - 3-4 x weekly - 2 sets - 10 reps - 3 sec hold - Seated Single Arm Shoulder Abduction - Thumb Up  - 1 x daily - 3-4 x weekly - 2 sets - 10 reps - 3 sec hold - Shoulder Internal Rotation with Resistance (Mirrored)  - 1 x daily - 3 x weekly - 1-2 sets - 10 reps - 3 sec hold - Shoulder External Rotation with Anchored Resistance  - 1 x daily - 3 x weekly - 1-2 sets - 10 reps - 3 sec hold   ASSESSMENT:  CLINICAL IMPRESSION:  Margaruite continues to report significant pain in her left neck. She responded fairly well to IASTM today, better than direct manual therapy. Patient issued static SCM stretch in S/L to be completed every 1 to 2 hours x 5 min. She may benefit from DN to L SCM to improved postural deficits. She did not tolerate left cervical AROM very well.    OBJECTIVE IMPAIRMENTS: decreased activity tolerance, decreased endurance, decreased knowledge of condition, decreased mobility, difficulty walking, decreased ROM, decreased strength, hypomobility, increased fascial restrictions, impaired perceived functional ability, increased muscle spasms, impaired flexibility, impaired UE functional use, improper body mechanics, postural dysfunction, and pain.   ACTIVITY LIMITATIONS: carrying, lifting, standing, sleeping, transfers, bed mobility, bathing, toileting, dressing, reach over head, and hygiene/grooming  PARTICIPATION LIMITATIONS: meal prep, cleaning, laundry, driving, shopping, community activity, occupation, yard work, and church  PERSONAL FACTORS: Age, Fitness, Past/current experiences, Time since onset of injury/illness/exacerbation, and 3+ comorbidities: Chronic neck pain, L shoulder advanced OA s/p reverse TSA on 10/14/22; R TKA 07/2019, B THR 2018 & 2017, posterior C1-2 cervical fusion 2018, rupture of flexor tendon of L hand with failed surgical repair 2020, Parkinson's, psoriatic arthritis, bladder cancer, HTN    are also affecting patient's functional outcome.   REHAB POTENTIAL: Good  CLINICAL DECISION MAKING: Evolving/moderate complexity  EVALUATION COMPLEXITY: Moderate   GOALS: Goals reviewed with patient? Yes  SHORT TERM GOALS: Target date: 03/21/2023  Patient will be independent  with initial HEP to improve outcomes and carryover.  Baseline: Established 03/03/23 Goal status: IN PROGRESS  2.  Patient will report 25% improvement in neck pain to improve QOL.   Baseline: 8-10/10 Goal status: IN PROGRESS  LONG TERM GOALS: Target date: 04/18/2023  Patient will be independent with ongoing/advanced HEP for self-management at home.  Baseline:  Goal status: IN PROGRESS  2.  Patient will demonstrate improved posture to decrease muscle imbalance. Baseline: rounded shoulders, forward head, weight shift right, and head laterally shifted  to R Goal status: IN PROGRESS  3.  Patient will report 50-75% improvement in neck pain to improve QOL.  Baseline: 8-10/10 Goal status: IN PROGRESS  4.  Patient will demonstrate functional pain free cervical ROM for safety with driving.  Baseline: refer to above cervical ROM table Goal status: IN PROGRESS  5.  Patient will report </= 37% on NDI to demonstrate improved functional ability.  Baseline: 26 / 50 = 52.0 % Goal status: IN PROGRESS  6  Patient will report >/= 50% improvement in her sleep due to decreased neck pain. Baseline: Sleep greatly disturbed for up to 3-5 hrs due to neck pain per NDI Goal status: IN PROGRESS   7.  Patient will be able to resume attending church services w/o limitation due to neck pain. Baseline: Has been unable to attend church Goal status: IN PROGRESS   8. Patient will be able to resume walking for exercise w/o limitation due to neck pain. Baseline: Has been unable to walk d/t pain Goal status: IN PROGRESS   PLAN:  PT FREQUENCY: 2x/week  PT DURATION: 8 weeks  PLANNED INTERVENTIONS: Therapeutic exercises, Therapeutic activity, Neuromuscular re-education, Balance training, Patient/Family education, Self Care, Joint mobilization, Dry Needling, Electrical stimulation, Spinal mobilization, Cryotherapy, Moist heat, Taping, Ultrasound, Ionotophoresis 4mg /ml Dexamethasone, Manual therapy, and Re-evaluation  PLAN FOR NEXT SESSION:  MT +/- DN as indicated and benefit noted; gentle cervical flexibility and ROM; postural correction and strengthening; review and progress HEP as able   Kailand Seda, PT 03/14/2023, 4:10 PM

## 2023-03-15 ENCOUNTER — Ambulatory Visit: Payer: Medicare Other | Admitting: Physical Therapy

## 2023-03-16 DIAGNOSIS — I739 Peripheral vascular disease, unspecified: Secondary | ICD-10-CM | POA: Diagnosis not present

## 2023-03-16 DIAGNOSIS — L84 Corns and callosities: Secondary | ICD-10-CM | POA: Diagnosis not present

## 2023-03-16 DIAGNOSIS — L603 Nail dystrophy: Secondary | ICD-10-CM | POA: Diagnosis not present

## 2023-03-16 NOTE — Therapy (Signed)
OUTPATIENT PHYSICAL THERAPY TREATMENT   Patient Name: Stephanie [name] MRN: [id number] DOB:Jul 13, 1932, 89 y.o., female Today's Date: 03/17/2023   END OF SESSION:  PT End of Session - 03/17/23 0933     Visit Number 5    Number of Visits 20    Date for PT Re-Evaluation 04/18/23    Authorization Type Medicare & BCBS    PT Start Time 0933    PT Stop Time 1019    PT Time Calculation (min) 46 min    Activity Tolerance Patient tolerated treatment well;Patient limited by pain    Behavior During Therapy [facility] for tasks assessed/performed              Past Medical History:  Diagnosis Date   Allergy    Anemia    past hx    Arthropathy of cervical spine 11/27/2012   Right  Xray at [facility]  On 04/08/2016  AP, lateral, and lateral flexion and extension views of the cervical spine are submitted for evaluation.   No acute fracture identified in the cervical spine.   There is redemonstration of approximately 2 mm of C3 on C4 anterolisthesis in neutral which slightly increases in flexion relative to neutral and extension. Slight C5 on C6 retrolisthesis does not change i   Benign hypertension without congestive heart failure    Benign mole    right hcets mole, bleeds when dried with towel   Bilateral dry eyes    Bladder cancer (HCC) November 07, 2016   Cervical spondylosis    Cervicalgia    2 screws in place    Depression with anxiety 11-07-2016   prior to husbands death    Dyspnea    Gross hematuria    developed after first hip replacment, led to indicental finding of bladder tumor, bleeding now resolved    H/O total shoulder replacement, left    History of kidney stones    1970's   Left hand weakness    due to reinjury of flxor tendon s/p tendon surgery march 12-2018   Lumbar stenosis    Malignant tumor of urinary bladder (HCC) 07/2016   Pre-stage I   OA (osteoarthritis)    Parkinson disease dx 2012   neurologist-  dr siddiqui at [facility]   Psoriatic arthritis [facility])     Dr Nickola Major , [place]  rheum    Rupture of flexor tendon of hand 2020   right   Past Surgical History:  Procedure Laterality Date   BIOPSY MASS LEFT FIRST METACARPAL   06/23/2006   benign   CATARACT EXTRACTION W/ INTRAOCULAR LENS  IMPLANT, BILATERAL  1999 and 2003   CERVICAL FUSION  02/2017   c1-c2 ; reports she has 2 screws 2in long in place done at [facility] ; patient exhibits VERY LIMITED NECK ROM   COLONOSCOPY     CYSTOSCOPY W/ RETROGRADES Bilateral 09/22/2016   Procedure: CYSTOSCOPY WITH RETROGRADE PYELOGRAM;  Surgeon: Sebastian Ache, MD;  Location: [facility];  Service: Urology;  Laterality: Bilateral;   D & C HYSTEROSCOPY W/ RESECTION POLYP  07/11/2000   DILATION AND CURETTAGE OF UTERUS  1960's   EXCISION CYST AND DEBRIDEMENT RIGHT WIRST AND REMOVAL FORGEIGN BODY  01/09/2009   FLEXOR TENDON REPAIR Left 12/12/2018   Procedure: REPAIR/TRANSFER FLEXOR DIGITORUM PROFUNDUS OF LEFT SMALL FINGER;  Surgeon: Cindee Salt, MD;  Location: [facility];  Service: Orthopedics;  Laterality: Left;   LAPAROSCOPY  yrs ago   infertility    METACARPOPHALANGEAL JOINT ARTHRODESIS  Right 05/25/1996   REVERSE SHOULDER ARTHROPLASTY Left 10/14/2022   Procedure: REVERSE SHOULDER ARTHROPLASTY;  Surgeon: Francena Hanly, MD;  Location: [facility];  Service: Orthopedics;  Laterality: Left;    TENDON REPAIR Left 01/15/2019   Hand   TONSILLECTOMY AND ADENOIDECTOMY  child   TOTAL HIP ARTHROPLASTY Left 07/07/2016   Procedure: LEFT TOTAL HIP ARTHROPLASTY ANTERIOR APPROACH;  Surgeon: Ollen Gross, MD;  Location: [facility];  Service: Orthopedics;  Laterality: Left;   TOTAL HIP ARTHROPLASTY Right 09/28/2017   Procedure: RIGHT TOTAL HIP ARTHROPLASTY ANTERIOR APPROACH;  Surgeon: Ollen Gross, MD;  Location: [facility];  Service: Orthopedics;  Laterality: Right;   TOTAL KNEE ARTHROPLASTY Right 07/30/2019   Procedure: TOTAL KNEE ARTHROPLASTY;  Surgeon: Ollen Gross, MD;  Location: [facility];  Service: Orthopedics;   Laterality: Right;    TRANSURETHRAL RESECTION OF BLADDER TUMOR N/A 09/22/2016   Procedure: TRANSURETHRAL RESECTION OF BLADDER TUMOR (TURBT);  Surgeon: Sebastian Ache, MD;  Location: [facility];  Service: Urology;  Laterality: N/A;   Patient Active Problem List   Diagnosis Date Noted   S/P reverse total shoulder arthroplasty, left 10/14/2022   Preoperative cardiovascular examination 10/04/2022   SOB (shortness of breath) 08/02/2022   Insomnia 08/02/2022   Hyponatremia 08/01/2022   Shoulder pain, left 09/14/2021   History of COVID-19 01/06/2021   Diarrhea 09/03/2020   Vitamin D deficiency 09/03/2020   OA (osteoarthritis) of knee 07/30/2019   Tinea corporis 06/08/2019   Psoriatic arthritis (HCC) 04/02/2019   Anemia 03/13/2018   Hot flashes 03/13/2018   S/P cervical spinal fusion 03/31/2017   Neck pain 12/12/2016   Depression with anxiety 10/17/2016   Bladder cancer (HCC) 10/17/2016   OA (osteoarthritis) of hip 07/07/2016   Spinal stenosis of lumbar region with radiculopathy 04/08/2016   Spondylolisthesis of lumbar region 04/08/2016   Spondylosis of cervical region without myelopathy or radiculopathy 04/08/2016   Preventative health care 01/18/2016   Low back pain 01/18/2016   History of chicken pox    Hyperlipidemia 12/15/2014   Allergic rhinitis 06/25/2014   Allergy to bee sting 06/10/2014   TMJ tenderness 11/16/2013   Other malaise and fatigue 05/07/2013   Arthropathy of cervical spine (HCC) 11/27/2012   Muscle spasms of neck 11/27/2012   Parkinson's disease 05/24/2012   Conjunctivochalasis 03/09/2012   Epiretinal membrane 03/09/2012   Hyperopia with astigmatism and presbyopia 03/09/2012   Pseudophakia 03/09/2012   HTN (hypertension) 08/31/2011   Right knee pain 08/31/2011   Benign hypertensive heart disease without heart failure 05/12/2011    PCP: Bradd Canary, MD   REFERRING PROVIDER: Romero Belling, MD   REFERRING DIAG: Multilevel  cervical spondylosis, L neck & trapezial myofascial pain/contracture   THERAPY DIAG:  Cervicalgia  Abnormal posture  Muscle weakness (generalized)  Other muscle spasm  Stiffness of left shoulder, not elsewhere classified  RATIONALE FOR EVALUATION AND TREATMENT: Rehabilitation  ONSET DATE: acute on chronic  NEXT MD VISIT: ~June 2024   SUBJECTIVE:  SUBJECTIVE STATEMENT: Patient reports no change in sx. I tried the new stretch but not sure if I'm doing it right.   PAIN: Are you having pain? Yes: NPRS scale: 5/10 Pain location: L neck and scapula Pain description: constant sharp Aggravating factors: any movement of head and neck, dyskinesia from PD meds Relieving factors: sitting still in her recliner  PERTINENT HISTORY:  Chronic neck pain, L shoulder advanced OA s/p reverse TSA on 10/14/22; R TKA 07/2019, B THR 2018 & 2017, posterior C1-2 cervical fusion 2018, rupture of flexor tendon of L hand with failed surgical repair 2020, Parkinson's, psoriatic arthritis, bladder cancer, HTN    PRECAUTIONS: None  HAND DOMINANCE: Right  WEIGHT BEARING RESTRICTIONS: No  FALLS:  Has patient fallen in last 6 months? No  LIVING ENVIRONMENT: Lives with: lives alone Lives in: House/apartment Stairs: Yes: External: 4 steps; on right going up, on left going up, and can reach both Has following equipment at home: Single point cane, Walker - 2 wheeled, Shower bench, bed side commode, and Grab bars  OCCUPATION: Retired  [employer]: Independent and Leisure: volunteering at [employer], bible study, walking 1/3 mile daily - unable recently due to pain   PATIENT GOALS: "Get rid of this pain so I can go back to my normal activities."   OBJECTIVE: (objective measures completed at initial evaluation unless  otherwise dated)  DIAGNOSTIC FINDINGS:  Cervical MRI - results not available, but pt reports she was told she had severe OA  02/07/23 - Cervical spine x-ray: IMPRESSION: Multilevel degenerative change and postoperative change without acute abnormality.  PATIENT SURVEYS:  NDI 26 / 50 = 52.0 %  COGNITION: Overall cognitive status: Within functional limits for tasks assessed  SENSATION: WFL  POSTURE:  rounded shoulders, forward head, weight shift right, and head laterally shifted to R  PALPATION: Severe increased muscle tension and TTP in L>R UT, LS and cervical paraspinals as well as L periscapular muscles   CERVICAL ROM:   Active ROM AROM  eval  Flexion 46  Extension 22 *  Right lateral flexion 16 *  Left lateral flexion 10 *  Right rotation 16 *  Left rotation 18*   (Blank rows = not tested, * = pain) 02/21/23 - clicking with B rotation  UPPER EXTREMITY ROM:  Active ROM Right eval Left eval  Shoulder flexion 146 111  Shoulder extension 50 42  Shoulder abduction 155 118  Shoulder adduction    Shoulder internal rotation FIR to sacrum FIR to lateral buttock  Shoulder external rotation FER T3 FER T1  Elbow flexion    Elbow extension    Wrist flexion    Wrist extension    Wrist ulnar deviation    Wrist radial deviation    Wrist pronation    Wrist supination     (Blank rows = not tested)  UPPER EXTREMITY MMT:   MMT Right 03/03/23 Left 03/03/23  Shoulder flexion 4+ 4-  Shoulder extension 4+ 4+  Shoulder abduction 4+ 4  Shoulder adduction    Shoulder internal rotation 4+ 4  Shoulder external rotation 4 4  Middle trapezius    Lower trapezius    Elbow flexion    Elbow extension    Wrist flexion    Wrist extension    Wrist ulnar deviation    Wrist radial deviation    Wrist pronation    Wrist supination    Grip strength     (Blank rows = not tested)  CERVICAL SPECIAL TESTS:  Spurling's test:  Positive and Distraction test: Positive    TODAY'S  TREATMENT:  03/17/23 THERAPEUTIC EXERCISE: to improve flexibility, strength and mobility.  Verbal and tactile cues throughout for technique.  UBE: L1.0 x 6 min (3' each fwd & back) - more comfortable going backwards Seated left lumbar stretch - painful in left neck S/L over yoga mat at just above hip level - minimal stretch of lumbar Passive stretch in above position was effective.  Supine active left rotation 5 sec hold x 5, also tried with TPR to SCM - too painful  MANUAL THERAPY: To promote normalized muscle tension, improved flexibility, improved joint mobility, increased ROM, and reduced pain. IASTM with EDGE tool to B UT, LS, cervical multifidi; L scalenes, rhomboids, SCM, left lumbar/QL. TPR to SCM in supine  02/2923 THERAPEUTIC EXERCISE: to improve flexibility, strength and mobility.  Verbal and tactile cues throughout for technique.  UBE: L1.0 x 6 min (3' each fwd & back) - more comfortable going backwards Post DN cervical motion to tolerance with stabilization to left clavicle  MANUAL THERAPY: To promote normalized muscle tension, improved flexibility, improved joint mobility, increased ROM, and reduced pain. Skilled palpation and monitoring of soft tissue during DN Trigger Point Dry-Needling  Treatment instructions: Expect mild to moderate muscle soreness. S/S of pneumothorax if dry needled over a lung field, and to seek immediate medical attention should they occur. Patient verbalized understanding of these instructions and education. Patient Consent Given: Yes Education handout provided: Previously provided Muscles treated:L UT, LS, rhomboids Electrical stimulation performed: No Parameters: N/A Treatment response/outcome: Twitch Response Elicited and Palpable Increase in Muscle Length IASTM with EDGE tool to B UT, LS, cervical multifidi; L scalenes, rhomboids, SCM,   03/10/23 THERAPEUTIC EXERCISE: to improve flexibility, strength and mobility.  Verbal and tactile cues  throughout for technique.  UBE: L1.0 x 6 min (3' each fwd & back) - more comfortable going backwards  MANUAL THERAPY: To promote normalized muscle tension, improved flexibility, improved joint mobility, increased ROM, and reduced pain. Skilled palpation and monitoring of soft tissue during DN Trigger Point Dry-Needling  Treatment instructions: Expect mild to moderate muscle soreness. S/S of pneumothorax if dry needled over a lung field, and to seek immediate medical attention should they occur. Patient verbalized understanding of these instructions and education. Patient Consent Given: Yes Education handout provided: Previously provided Muscles treated: B cervical paraspinals, L scalenes, UT, LS, rhomboids & subscapularis Electrical stimulation performed: No Parameters: N/A Treatment response/outcome: Twitch Response Elicited and Palpable Increase in Muscle Length STM/DTM, manual TPR and pin & stretch to muscles addressed with DN Cervical side glide  L scapular mobs   03/03/23 THERAPEUTIC EXERCISE: to improve flexibility, strength and mobility.  Verbal and tactile cues throughout for technique.  UBE: L1.0 x 6 min (3' each fwd & back) - more comfortable going backwards Seated cervical retraction 10 x 5" Seated backwards shoulder rolls x 10 - pt reports this is one of the most helpful exercises  Seated scap retraction & depression 10 x 5" Seated R SCM stretch - pt having difficulty stabilizing clavicle due to her recent reverse TSA limiting her cross body reach as well as advance arthritis and torn tendon in her L hand, therefore deferred from HEP for now Seated first rib mobilization with towel + UT stretch - difficulty isolating stretch without increased pain Verbally reviewed TB resisted rows and retraction but not attempted today - pt reports using YTB at home Pt reports she is also still completing some the shoulder exercises from  her recent reverse TSA HEP  MANUAL THERAPY: To promote  normalized muscle tension, improved flexibility, improved joint mobility, increased ROM, and reduced pain.  Seated STM/DTM, manual TPR and pin & stretch to B UT, LS, SCM and cervical paraspinals Cervical side glide and NAGs for B cervical rotation  MODALITIES: IFC to B UT & LS, 80-150 Hz, intensity to patient tolerance x 15 min Moist heat to cervical spine and upper shoulders  PATIENT EDUCATION:  Education details: HEP review and HEP modification - deferred SCM stretch   Person educated: Patient Education method: Explanation, Demonstration, and Handouts Education comprehension: verbalized understanding  HOME EXERCISE PROGRAM: Access Code: [email or web address] URL: [email or web address] Date: 03/03/2023 Prepared by: Glenetta Hew  Exercises - Seated Cervical Retraction  - 2-3 x daily - 7 x weekly - 2 sets - 10 reps - 3-5 sec hold - Shoulder Rolls in Sitting  - 2-3 x daily - 7 x weekly - 2 sets - 10 reps - 3 sec hold - Seated Scapular Retraction  - 2-3 x daily - 7 x weekly - 2 sets - 10 reps - 5 sec hold - Sternocleidomastoid Stretch  - 2-3 x daily - 7 x weekly - 3 reps - 30 sec hold (hold for now) - Standing Bilateral Low Shoulder Row with Anchored Resistance  - 1 x daily - 3-4 x weekly - 2 sets - 10 reps - 5 sec hold - Scapular Retraction with Resistance Advanced  - 1 x daily - 3-4 x weekly - 2 sets - 10 reps - 5 sec hold  The following exercises still being performed from her recent reverse TSA HEP: - Seated Shoulder Flexion Full Range (Mirrored)  - 1 x daily - 3-4 x weekly - 2 sets - 10 reps - 3 sec hold - Seated Single Arm Shoulder Abduction - Thumb Up  - 1 x daily - 3-4 x weekly - 2 sets - 10 reps - 3 sec hold - Shoulder Internal Rotation with Resistance (Mirrored)  - 1 x daily - 3 x weekly - 1-2 sets - 10 reps - 3 sec hold - Shoulder External Rotation with Anchored Resistance  - 1 x daily - 3 x weekly - 1-2 sets - 10 reps - 3 sec hold   ASSESSMENT:  CLINICAL IMPRESSION:   Caleigh presents with no significant change in left neck pain. We did more DN and MT to left neck and rhomboids and STM to left lumbar. DN is very painful to her in the UTs but she tolerated it. Attempted to stretch left lumbar with various stretches, but nothing very effective, in hopes of decreasing pull on left neck musculature. Good response to IASTM.  OBJECTIVE IMPAIRMENTS: decreased activity tolerance, decreased endurance, decreased knowledge of condition, decreased mobility, difficulty walking, decreased ROM, decreased strength, hypomobility, increased fascial restrictions, impaired perceived functional ability, increased muscle spasms, impaired flexibility, impaired UE functional use, improper body mechanics, postural dysfunction, and pain.   ACTIVITY LIMITATIONS: carrying, lifting, standing, sleeping, transfers, bed mobility, bathing, toileting, dressing, reach over head, and hygiene/grooming  PARTICIPATION LIMITATIONS: meal prep, cleaning, laundry, driving, shopping, community activity, occupation, yard work, and church  PERSONAL FACTORS: Age, Fitness, Past/current experiences, Time since onset of injury/illness/exacerbation, and 3+ comorbidities: Chronic neck pain, L shoulder advanced OA s/p reverse TSA on 10/14/22; R TKA 07/2019, B THR 2018 & 2017, posterior C1-2 cervical fusion 2018, rupture of flexor tendon of L hand with failed surgical repair 2020, Parkinson's, psoriatic arthritis, bladder cancer, HTN    are also  affecting patient's functional outcome.   REHAB POTENTIAL: Good  CLINICAL DECISION MAKING: Evolving/moderate complexity  EVALUATION COMPLEXITY: Moderate   GOALS: Goals reviewed with patient? Yes  SHORT TERM GOALS: Target date: 03/21/2023  Patient will be independent with initial HEP to improve outcomes and carryover.  Baseline: Established 03/03/23 Goal status: IN PROGRESS  2.  Patient will report 25% improvement in neck pain to improve QOL.   Baseline: 8-10/10 Goal  status: IN PROGRESS  LONG TERM GOALS: Target date: 04/18/2023  Patient will be independent with ongoing/advanced HEP for self-management at home.  Baseline:  Goal status: IN PROGRESS  2.  Patient will demonstrate improved posture to decrease muscle imbalance. Baseline: rounded shoulders, forward head, weight shift right, and head laterally shifted to R Goal status: IN PROGRESS  3.  Patient will report 50-75% improvement in neck pain to improve QOL.  Baseline: 8-10/10 Goal status: IN PROGRESS  4.  Patient will demonstrate functional pain free cervical ROM for safety with driving.  Baseline: refer to above cervical ROM table Goal status: IN PROGRESS  5.  Patient will report </= 37% on NDI to demonstrate improved functional ability.  Baseline: 26 / 50 = 52.0 % Goal status: IN PROGRESS  6  Patient will report >/= 50% improvement in her sleep due to decreased neck pain. Baseline: Sleep greatly disturbed for up to 3-5 hrs due to neck pain per NDI Goal status: IN PROGRESS   7.  Patient will be able to resume attending church services w/o limitation due to neck pain. Baseline: Has been unable to attend church Goal status: IN PROGRESS   8. Patient will be able to resume walking for exercise w/o limitation due to neck pain. Baseline: Has been unable to walk d/t pain Goal status: IN PROGRESS   PLAN:  PT FREQUENCY: 2x/week  PT DURATION: 8 weeks  PLANNED INTERVENTIONS: Therapeutic exercises, Therapeutic activity, Neuromuscular re-education, Balance training, Patient/Family education, Self Care, Joint mobilization, Dry Needling, Electrical stimulation, Spinal mobilization, Cryotherapy, Moist heat, Taping, Ultrasound, Ionotophoresis 4mg /ml Dexamethasone, Manual therapy, and Re-evaluation  PLAN FOR NEXT SESSION:  MT +/- DN as indicated and benefit noted; gentle cervical flexibility and ROM; postural correction and strengthening; review and progress HEP as able   Lakelynn Severtson,  PT 03/17/2023, 12:15 PM

## 2023-03-17 ENCOUNTER — Encounter: Payer: Self-pay | Admitting: Physical Therapy

## 2023-03-17 ENCOUNTER — Ambulatory Visit: Payer: Medicare Other | Attending: Neurology | Admitting: Physical Therapy

## 2023-03-17 DIAGNOSIS — M62838 Other muscle spasm: Secondary | ICD-10-CM | POA: Diagnosis not present

## 2023-03-17 DIAGNOSIS — M6281 Muscle weakness (generalized): Secondary | ICD-10-CM | POA: Insufficient documentation

## 2023-03-17 DIAGNOSIS — M542 Cervicalgia: Secondary | ICD-10-CM | POA: Diagnosis not present

## 2023-03-17 DIAGNOSIS — R293 Abnormal posture: Secondary | ICD-10-CM | POA: Diagnosis not present

## 2023-03-17 DIAGNOSIS — M25612 Stiffness of left shoulder, not elsewhere classified: Secondary | ICD-10-CM | POA: Insufficient documentation

## 2023-03-21 ENCOUNTER — Ambulatory Visit: Payer: Medicare Other | Admitting: Physical Therapy

## 2023-03-21 ENCOUNTER — Encounter: Payer: Self-pay | Admitting: Physical Therapy

## 2023-03-21 DIAGNOSIS — M542 Cervicalgia: Secondary | ICD-10-CM

## 2023-03-21 DIAGNOSIS — R293 Abnormal posture: Secondary | ICD-10-CM | POA: Diagnosis not present

## 2023-03-21 DIAGNOSIS — M6281 Muscle weakness (generalized): Secondary | ICD-10-CM | POA: Diagnosis not present

## 2023-03-21 DIAGNOSIS — M62838 Other muscle spasm: Secondary | ICD-10-CM | POA: Diagnosis not present

## 2023-03-21 DIAGNOSIS — M25612 Stiffness of left shoulder, not elsewhere classified: Secondary | ICD-10-CM

## 2023-03-21 NOTE — Therapy (Signed)
OUTPATIENT PHYSICAL THERAPY TREATMENT   Patient Name: Stephanie [name] MRN: [id number] DOB:[birth year], 89 y.o., female Today's Date: 03/21/2023   END OF SESSION:  PT End of Session - 03/21/23 1109     Visit Number 6    Number of Visits 20    Date for PT Re-Evaluation 04/18/23    Authorization Type Medicare & BCBS    Progress Note Due on Visit 10    PT Start Time 1105    PT Stop Time 1200    PT Time Calculation (min) 55 min    Activity Tolerance Patient tolerated treatment well;Patient limited by pain    Behavior During Therapy [facility] for tasks assessed/performed              Past Medical History:  Diagnosis Date   Allergy    Anemia    past hx    Arthropathy of cervical spine 11/27/2012   Right  Xray at [facility]  On 04/08/2016  AP, lateral, and lateral flexion and extension views of the cervical spine are submitted for evaluation.   No acute fracture identified in the cervical spine.   There is redemonstration of approximately 2 mm of C3 on C4 anterolisthesis in neutral which slightly increases in flexion relative to neutral and extension. Slight C5 on C6 retrolisthesis does not change i   Benign hypertension without congestive heart failure    Benign mole    right hcets mole, bleeds when dried with towel   Bilateral dry eyes    Bladder cancer (HCC) 11-08-2016   Cervical spondylosis    Cervicalgia    2 screws in place    Depression with anxiety Nov 08, 2016   prior to husbands death    Dyspnea    Gross hematuria    developed after first hip replacment, led to indicental finding of bladder tumor, bleeding now resolved    H/O total shoulder replacement, left    History of kidney stones    1970's   Left hand weakness    due to reinjury of flxor tendon s/p tendon surgery march 12-2018   Lumbar stenosis    Malignant tumor of urinary bladder (HCC) 07/2016   Pre-stage I   OA (osteoarthritis)    Parkinson disease dx 2012   neurologist-  dr siddiqui at [facility]   Psoriatic  arthritis [facility])     Dr Nickola Major , [place] [facility]    Rupture of flexor tendon of hand 2020   right   Past Surgical History:  Procedure Laterality Date   BIOPSY MASS LEFT FIRST METACARPAL   06/23/2006   benign   CATARACT EXTRACTION W/ INTRAOCULAR LENS  IMPLANT, BILATERAL  1999 and 2003   CERVICAL FUSION  02/2017   c1-c2 ; reports she has 2 screws 2in long in place done at [facility] ; patient exhibits VERY LIMITED NECK ROM   COLONOSCOPY     CYSTOSCOPY W/ RETROGRADES Bilateral 09/22/2016   Procedure: CYSTOSCOPY WITH RETROGRADE PYELOGRAM;  Surgeon: Sebastian Ache, MD;  Location: [facility];  Service: Urology;  Laterality: Bilateral;   D & C HYSTEROSCOPY W/ RESECTION POLYP  07/11/2000   DILATION AND CURETTAGE OF UTERUS  1960's   EXCISION CYST AND DEBRIDEMENT RIGHT WIRST AND REMOVAL FORGEIGN BODY  01/09/2009   FLEXOR TENDON REPAIR Left 12/12/2018   Procedure: REPAIR/TRANSFER FLEXOR DIGITORUM PROFUNDUS OF LEFT SMALL FINGER;  Surgeon: Cindee Salt, MD;  Location: [facility];  Service: Orthopedics;  Laterality: Left;   LAPAROSCOPY  yrs ago  infertility    METACARPOPHALANGEAL JOINT ARTHRODESIS Right 05/25/1996   REVERSE SHOULDER ARTHROPLASTY Left 10/14/2022   Procedure: REVERSE SHOULDER ARTHROPLASTY;  Surgeon: Francena Hanly, MD;  Location: [facility];  Service: Orthopedics;  Laterality: Left;    TENDON REPAIR Left 01/15/2019   Hand   TONSILLECTOMY AND ADENOIDECTOMY  child   TOTAL HIP ARTHROPLASTY Left 07/07/2016   Procedure: LEFT TOTAL HIP ARTHROPLASTY ANTERIOR APPROACH;  Surgeon: Ollen Gross, MD;  Location: [facility];  Service: Orthopedics;  Laterality: Left;   TOTAL HIP ARTHROPLASTY Right 09/28/2017   Procedure: RIGHT TOTAL HIP ARTHROPLASTY ANTERIOR APPROACH;  Surgeon: Ollen Gross, MD;  Location: [facility];  Service: Orthopedics;  Laterality: Right;   TOTAL KNEE ARTHROPLASTY Right 07/30/2019   Procedure: TOTAL KNEE ARTHROPLASTY;  Surgeon: Ollen Gross, MD;   Location: [facility];  Service: Orthopedics;  Laterality: Right;    TRANSURETHRAL RESECTION OF BLADDER TUMOR N/A 09/22/2016   Procedure: TRANSURETHRAL RESECTION OF BLADDER TUMOR (TURBT);  Surgeon: Sebastian Ache, MD;  Location: [facility];  Service: Urology;  Laterality: N/A;   Patient Active Problem List   Diagnosis Date Noted   S/P reverse total shoulder arthroplasty, left 10/14/2022   Preoperative cardiovascular examination 10/04/2022   SOB (shortness of breath) 08/02/2022   Insomnia 08/02/2022   Hyponatremia 08/01/2022   Shoulder pain, left 09/14/2021   History of COVID-19 01/06/2021   Diarrhea 09/03/2020   Vitamin D deficiency 09/03/2020   OA (osteoarthritis) of knee 07/30/2019   Tinea corporis 06/08/2019   Psoriatic arthritis (HCC) 04/02/2019   Anemia 03/13/2018   Hot flashes 03/13/2018   S/P cervical spinal fusion 03/31/2017   Neck pain 12/12/2016   Depression with anxiety 10/17/2016   Bladder cancer (HCC) 10/17/2016   OA (osteoarthritis) of hip 07/07/2016   Spinal stenosis of lumbar region with radiculopathy 04/08/2016   Spondylolisthesis of lumbar region 04/08/2016   Spondylosis of cervical region without myelopathy or radiculopathy 04/08/2016   Preventative health care 01/18/2016   Low back pain 01/18/2016   History of chicken pox    Hyperlipidemia 12/15/2014   Allergic rhinitis 06/25/2014   Allergy to bee sting 06/10/2014   TMJ tenderness 11/16/2013   Other malaise and fatigue 05/07/2013   Arthropathy of cervical spine (HCC) 11/27/2012   Muscle spasms of neck 11/27/2012   Parkinson's disease 05/24/2012   Conjunctivochalasis 03/09/2012   Epiretinal membrane 03/09/2012   Hyperopia with astigmatism and presbyopia 03/09/2012   Pseudophakia 03/09/2012   HTN (hypertension) 08/31/2011   Right knee pain 08/31/2011   Benign hypertensive heart disease without heart failure 05/12/2011    PCP: Bradd Canary, MD   REFERRING PROVIDER: Romero Belling,  MD   REFERRING DIAG: Multilevel cervical spondylosis, L neck & trapezial myofascial pain/contracture   THERAPY DIAG:  Cervicalgia  Abnormal posture  Muscle weakness (generalized)  Other muscle spasm  Stiffness of left shoulder, not elsewhere classified  RATIONALE FOR EVALUATION AND TREATMENT: Rehabilitation  ONSET DATE: acute on chronic  NEXT MD VISIT: ~June 2024   SUBJECTIVE:  SUBJECTIVE STATEMENT: Bad today. I woke up with no pain, but within 15 min pain was back. Having infusion tomorrow so we'll see if that helps.   PAIN: Are you having pain? Yes: NPRS scale: 5/10 Pain location: L neck and scapula Pain description: constant sharp Aggravating factors: any movement of head and neck, dyskinesia from PD meds Relieving factors: sitting still in her recliner  PERTINENT HISTORY:  Chronic neck pain, L shoulder advanced OA s/p reverse TSA on 10/14/22; R TKA 07/2019, B THR 2018 & 2017, posterior C1-2 cervical fusion 2018, rupture of flexor tendon of L hand with failed surgical repair 2020, Parkinson's, psoriatic arthritis, bladder cancer, HTN    PRECAUTIONS: None  HAND DOMINANCE: Right  WEIGHT BEARING RESTRICTIONS: No  FALLS:  Has patient fallen in last 6 months? No  LIVING ENVIRONMENT: Lives with: lives alone Lives in: House/apartment Stairs: Yes: External: 4 steps; on right going up, on left going up, and can reach both Has following equipment at home: Single point cane, Walker - 2 wheeled, Shower bench, bed side commode, and Grab bars  OCCUPATION: Retired  [employer]: Independent and Leisure: volunteering at [employer], bible study, walking 1/3 mile daily - unable recently due to pain   PATIENT GOALS: "Get rid of this pain so I can go back to my normal  activities."   OBJECTIVE: (objective measures completed at initial evaluation unless otherwise dated)  DIAGNOSTIC FINDINGS:  Cervical MRI - results not available, but pt reports she was told she had severe OA  02/07/23 - Cervical spine x-ray: IMPRESSION: Multilevel degenerative change and postoperative change without acute abnormality.  PATIENT SURVEYS:  NDI 26 / 50 = 52.0 %  COGNITION: Overall cognitive status: Within functional limits for tasks assessed  SENSATION: WFL  POSTURE:  rounded shoulders, forward head, weight shift right, and head laterally shifted to R  PALPATION: Severe increased muscle tension and TTP in L>R UT, LS and cervical paraspinals as well as L periscapular muscles   CERVICAL ROM:   Active ROM AROM  eval  Flexion 46  Extension 22 *  Right lateral flexion 16 *  Left lateral flexion 10 *  Right rotation 16 *  Left rotation 18*   (Blank rows = not tested, * = pain) 02/21/23 - clicking with B rotation  UPPER EXTREMITY ROM:  Active ROM Right eval Left eval  Shoulder flexion 146 111  Shoulder extension 50 42  Shoulder abduction 155 118  Shoulder adduction    Shoulder internal rotation FIR to sacrum FIR to lateral buttock  Shoulder external rotation FER T3 FER T1  Elbow flexion    Elbow extension    Wrist flexion    Wrist extension    Wrist ulnar deviation    Wrist radial deviation    Wrist pronation    Wrist supination     (Blank rows = not tested)  UPPER EXTREMITY MMT:   MMT Right 03/03/23 Left 03/03/23  Shoulder flexion 4+ 4-  Shoulder extension 4+ 4+  Shoulder abduction 4+ 4  Shoulder adduction    Shoulder internal rotation 4+ 4  Shoulder external rotation 4 4   (Blank rows = not tested)  CERVICAL SPECIAL TESTS:  Spurling's test: Positive and Distraction test: Positive    TODAY'S TREATMENT:  03/21/23 THERAPEUTIC EXERCISE: to improve flexibility, strength and mobility.  Verbal and tactile cues throughout for technique.  UBE:  L1.0 x 6 min (3' each fwd & back) - more comfortable going backwards Seated neck isometrics 10 sec hold x  5 ea - ext. SB, rot and flexion Seated chin tuck into ball x 10 Seated left neck rotation x 10 (pt looking to a target)  Manual Therapy: to decrease muscle spasm and pain and improve mobility TPR  to left LS and cervical muscles as tolerated  Positioning: towel around neck, left SDLY. Tolerated in neck but painful in hip.Asked pt to try at home on bed.  Modalities: IFC estim to left neck/UT/LS x 10 min in sitting with MHP to neck   03/17/23 THERAPEUTIC EXERCISE: to improve flexibility, strength and mobility.  Verbal and tactile cues throughout for technique.  UBE: L1.0 x 6 min (3' each fwd & back) - more comfortable going backwards Seated left lumbar stretch - painful in left neck S/L over yoga mat at just above hip level - minimal stretch of lumbar Passive stretch in above position was effective.  Supine active left rotation 5 sec hold x 5, also tried with TPR to SCM - too painful  MANUAL THERAPY: To promote normalized muscle tension, improved flexibility, improved joint mobility, increased ROM, and reduced pain. IASTM with EDGE tool to B UT, LS, cervical multifidi; L scalenes, rhomboids, SCM, left lumbar/QL. TPR to SCM in supine  02/2923 THERAPEUTIC EXERCISE: to improve flexibility, strength and mobility.  Verbal and tactile cues throughout for technique.  UBE: L1.0 x 6 min (3' each fwd & back) - more comfortable going backwards Post DN cervical motion to tolerance with stabilization to left clavicle  MANUAL THERAPY: To promote normalized muscle tension, improved flexibility, improved joint mobility, increased ROM, and reduced pain. Skilled palpation and monitoring of soft tissue during DN Trigger Point Dry-Needling  Treatment instructions: Expect mild to moderate muscle soreness. S/S of pneumothorax if dry needled over a lung field, and to seek immediate medical attention should  they occur. Patient verbalized understanding of these instructions and education. Patient Consent Given: Yes Education handout provided: Previously provided Muscles treated:L UT, LS, rhomboids Electrical stimulation performed: No Parameters: N/A Treatment response/outcome: Twitch Response Elicited and Palpable Increase in Muscle Length IASTM with EDGE tool to B UT, LS, cervical multifidi; L scalenes, rhomboids, SCM,   03/10/23 THERAPEUTIC EXERCISE: to improve flexibility, strength and mobility.  Verbal and tactile cues throughout for technique.  UBE: L1.0 x 6 min (3' each fwd & back) - more comfortable going backwards  MANUAL THERAPY: To promote normalized muscle tension, improved flexibility, improved joint mobility, increased ROM, and reduced pain. Skilled palpation and monitoring of soft tissue during DN Trigger Point Dry-Needling  Treatment instructions: Expect mild to moderate muscle soreness. S/S of pneumothorax if dry needled over a lung field, and to seek immediate medical attention should they occur. Patient verbalized understanding of these instructions and education. Patient Consent Given: Yes Education handout provided: Previously provided Muscles treated: B cervical paraspinals, L scalenes, UT, LS, rhomboids & subscapularis Electrical stimulation performed: No Parameters: N/A Treatment response/outcome: Twitch Response Elicited and Palpable Increase in Muscle Length STM/DTM, manual TPR and pin & stretch to muscles addressed with DN Cervical side glide  L scapular mobs   03/03/23 THERAPEUTIC EXERCISE: to improve flexibility, strength and mobility.  Verbal and tactile cues throughout for technique.  UBE: L1.0 x 6 min (3' each fwd & back) - more comfortable going backwards Seated cervical retraction 10 x 5" Seated backwards shoulder rolls x 10 - pt reports this is one of the most helpful exercises  Seated scap retraction & depression 10 x 5" Seated R SCM stretch - pt having  difficulty stabilizing  clavicle due to her recent reverse TSA limiting her cross body reach as well as advance arthritis and torn tendon in her L hand, therefore deferred from HEP for now Seated first rib mobilization with towel + UT stretch - difficulty isolating stretch without increased pain Verbally reviewed TB resisted rows and retraction but not attempted today - pt reports using YTB at home Pt reports she is also still completing some the shoulder exercises from her recent reverse TSA HEP  MANUAL THERAPY: To promote normalized muscle tension, improved flexibility, improved joint mobility, increased ROM, and reduced pain.  Seated STM/DTM, manual TPR and pin & stretch to B UT, LS, SCM and cervical paraspinals Cervical side glide and NAGs for B cervical rotation  MODALITIES: IFC to B UT & LS, 80-150 Hz, intensity to patient tolerance x 15 min Moist heat to cervical spine and upper shoulders  PATIENT EDUCATION:  Education details: HEP review and HEP modification - deferred SCM stretch   Person educated: Patient Education method: Explanation, Demonstration, and Handouts Education comprehension: verbalized understanding  HOME EXERCISE PROGRAM: Access Code: [email or web address] URL: [email or web address] Date: 03/21/2023 Prepared by: Raynelle Fanning  Exercises - Seated Cervical Retraction  - 2-3 x daily - 7 x weekly - 2 sets - 10 reps - 3-5 sec hold - Shoulder Rolls in Sitting  - 2-3 x daily - 7 x weekly - 2 sets - 10 reps - 3 sec hold - Seated Scapular Retraction  - 2-3 x daily - 7 x weekly - 2 sets - 10 reps - 5 sec hold - Sternocleidomastoid Stretch  - 2-3 x daily - 7 x weekly - 3 reps - 30 sec hold - Standing Bilateral Low Shoulder Row with Anchored Resistance  - 1 x daily - 3-4 x weekly - 2 sets - 10 reps - 5 sec hold - Scapular Retraction with Resistance Advanced  - 1 x daily - 3-4 x weekly - 2 sets - 10 reps - 5 sec hold - Seated Shoulder Flexion Full Range (Mirrored)  - 1 x daily -  3-4 x weekly - 2 sets - 10 reps - 3 sec hold - Seated Single Arm Shoulder Abduction - Thumb Up  - 1 x daily - 3-4 x weekly - 2 sets - 10 reps - 3 sec hold - Shoulder Internal Rotation with Resistance (Mirrored)  - 1 x daily - 3 x weekly - 1-2 sets - 10 reps - 3 sec hold - Shoulder External Rotation with Anchored Resistance  - 1 x daily - 3 x weekly - 1-2 sets - 10 reps - 3 sec hold - Standing Isometric Cervical Sidebending with Manual Resistance  - 1 x daily - 7 x weekly - 1 sets - 5 reps - 10 sec hold hold - Standing Isometric Cervical Flexion with Manual Resistance  - 1 x daily - 7 x weekly - 1 sets - 5 reps - 10 sec hold - Standing Isometric Cervical Extension with Manual Resistance  - 1 x daily - 7 x weekly - 1 sets - 5 reps - 10 sec hold - Seated Isometric Cervical Rotation  - 1 x daily - 7 x weekly - 1 sets - 5 reps - 10 sec hold   ASSESSMENT:  CLINICAL IMPRESSION: Serra presents with ongoing pain in her neck. She awoke with no pain this morning but pain returned upon getting up after 15 min. We focused on isometrics today which she tolerated well as she also reports clicking in her neck. HEP progressed  also. Ended session with estim to help with pain control. Aneri continues to demonstrate potential for improvement and would benefit from continued skilled therapy to address impairments.    OBJECTIVE IMPAIRMENTS: decreased activity tolerance, decreased endurance, decreased knowledge of condition, decreased mobility, difficulty walking, decreased ROM, decreased strength, hypomobility, increased fascial restrictions, impaired perceived functional ability, increased muscle spasms, impaired flexibility, impaired UE functional use, improper body mechanics, postural dysfunction, and pain.   ACTIVITY LIMITATIONS: carrying, lifting, standing, sleeping, transfers, bed mobility, bathing, toileting, dressing, reach over head, and hygiene/grooming  PARTICIPATION LIMITATIONS: meal prep, cleaning,  laundry, driving, shopping, community activity, occupation, yard work, and church  PERSONAL FACTORS: Age, Fitness, Past/current experiences, Time since onset of injury/illness/exacerbation, and 3+ comorbidities: Chronic neck pain, L shoulder advanced OA s/p reverse TSA on 10/14/22; R TKA 07/2019, B THR 2018 & 2017, posterior C1-2 cervical fusion 2018, rupture of flexor tendon of L hand with failed surgical repair 2020, Parkinson's, psoriatic arthritis, bladder cancer, HTN    are also affecting patient's functional outcome.   REHAB POTENTIAL: Good  CLINICAL DECISION MAKING: Evolving/moderate complexity  EVALUATION COMPLEXITY: Moderate   GOALS: Goals reviewed with patient? Yes  SHORT TERM GOALS: Target date: 03/21/2023  Patient will be independent with initial HEP to improve outcomes and carryover.  Baseline: Established 03/03/23 Goal status: IN PROGRESS  2.  Patient will report 25% improvement in neck pain to improve QOL.   Baseline: 8-10/10 Goal status: IN PROGRESS 5/624 no change in pain  LONG TERM GOALS: Target date: 04/18/2023  Patient will be independent with ongoing/advanced HEP for self-management at home.  Baseline:  Goal status: IN PROGRESS  2.  Patient will demonstrate improved posture to decrease muscle imbalance. Baseline: rounded shoulders, forward head, weight shift right, and head laterally shifted to R Goal status: IN PROGRESS  3.  Patient will report 50-75% improvement in neck pain to improve QOL.  Baseline: 8-10/10 Goal status: IN PROGRESS  4.  Patient will demonstrate functional pain free cervical ROM for safety with driving.  Baseline: refer to above cervical ROM table Goal status: IN PROGRESS  5.  Patient will report </= 37% on NDI to demonstrate improved functional ability.  Baseline: 26 / 50 = 52.0 % Goal status: IN PROGRESS  6  Patient will report >/= 50% improvement in her sleep due to decreased neck pain. Baseline: Sleep greatly disturbed for up to  3-5 hrs due to neck pain per NDI Goal status: IN PROGRESS   7.  Patient will be able to resume attending church services w/o limitation due to neck pain. Baseline: Has been unable to attend church Goal status: IN PROGRESS   8. Patient will be able to resume walking for exercise w/o limitation due to neck pain. Baseline: Has been unable to walk d/t pain Goal status: IN PROGRESS   PLAN:  PT FREQUENCY: 2x/week  PT DURATION: 8 weeks  PLANNED INTERVENTIONS: Therapeutic exercises, Therapeutic activity, Neuromuscular re-education, Balance training, Patient/Family education, Self Care, Joint mobilization, Dry Needling, Electrical stimulation, Spinal mobilization, Cryotherapy, Moist heat, Taping, Ultrasound, Ionotophoresis 4mg /ml Dexamethasone, Manual therapy, and Re-evaluation  PLAN FOR NEXT SESSION: Assess response to isometrics and to lying on left side with towel roll;  MT +/- DN as indicated and benefit noted; gentle cervical flexibility and ROM; postural correction and strengthening; review and progress HEP as able   Jaquetta Currier, PT 03/21/2023, 12:20 PM

## 2023-03-22 DIAGNOSIS — L4050 Arthropathic psoriasis, unspecified: Secondary | ICD-10-CM | POA: Diagnosis not present

## 2023-03-24 ENCOUNTER — Ambulatory Visit: Payer: Medicare Other | Admitting: Physical Therapy

## 2023-03-28 ENCOUNTER — Ambulatory Visit: Payer: Medicare Other | Admitting: Physical Therapy

## 2023-03-29 ENCOUNTER — Ambulatory Visit: Payer: Self-pay | Admitting: Licensed Clinical Social Worker

## 2023-03-29 NOTE — Patient Outreach (Signed)
  Care Coordination   Follow Up Visit Note   03/29/2023 Name: Stephanie [name] MRN: [id number] DOB: [birth year]  Stephanie [name] is a 89 y.o. year old female who sees Stephanie Canary, MD for primary care. I spoke with  Stephanie [name] by phone today.  What matters to the patients health and wellness today?   Client has neck pain issues.    Goals Addressed             This Visit's Progress    client has neck pain issues. this affects her ability to function. She has to take rest breaks as needed       Interventions  Spoke with client via phone about client needs Discussed neck pain issues. She said neck pain affects her daily activities. She has to take periodic rest breaks during the day Client has sleeping challenges due to neck pain Provided counseling support Discussed transportation needs. Discussed appetite of client.  Discussed meal provision of client. Client cooks often Discussed support of her daughter. Her daughter [name] in [place], [place].  Discussed medication procurement Discussed physical therapy support. Client is receiving, as scheduled, dry needling for physical therapy support Discussed vision of client. She wears glasses.  Discussed client support with PCP, Stephanie [name]. Encouraged Virdie to call LCSW as needed for SW support at [phone]          SDOH assessments and interventions completed:  Yes  SDOH Interventions Today    Flowsheet Row Most Recent Value  SDOH Interventions   Depression Interventions/Treatment  Counseling, Medication  Physical Activity Interventions Other (Comments)  [client said she participates in physical therapy called dry needling]  Stress Interventions Other (Comment)  [client has stress related to neck pain issues. client has stress in managing medical needs]        Care Coordination Interventions:  Yes, provided   Interventions Today    Flowsheet Row Most Recent Value  Chronic Disease   Chronic disease during  today's visit Other  [spoke with client about client needs]  General Interventions   General Interventions Discussed/Reviewed General Interventions Discussed, Community Resources  [discussed program support]  Exercise Interventions   Exercise Discussed/Reviewed Physical Activity  Physical Activity Discussed/Reviewed Physical Activity Reviewed  Education Interventions   Education Provided Provided Education  Provided Verbal Education On Community Resources  Mental Health Interventions   Mental Health Discussed/Reviewed Anxiety, Coping Strategies  [client feels that her mood is good,  she feels that her mood is stable]  Nutrition Interventions   Nutrition Discussed/Reviewed Nutrition Discussed  Pharmacy Interventions   Pharmacy Dicussed/Reviewed Pharmacy Topics Discussed  Safety Interventions   Safety Discussed/Reviewed Fall Risk        Follow up plan: Follow up call scheduled for 05/10/23 at 2:30 PM     Encounter Outcome:  Pt. Visit Completed   Kelton Pillar.Tuan Tippin MSW, LCSW Licensed Visual merchandiser [facility] Care Management [phone]

## 2023-03-29 NOTE — Patient Instructions (Signed)
Visit Information  Thank you for taking time to visit with me today. Please don't hesitate to contact me if I can be of assistance to you.   Following are the goals we discussed today:   Goals Addressed             This Visit's Progress    client has neck pain issues. this affects her ability to function. She has to take rest breaks as needed       Interventions  Spoke with client via phone about client needs Discussed neck pain issues. She said neck pain affects her daily activities. She has to take periodic rest breaks during the day Client has sleeping challenges due to neck pain Provided counseling support Discussed transportation needs. Discussed appetite of client.  Discussed meal provision of client. Client cooks often Discussed support of her daughter. Her daughter resides in [place], [place].  Discussed medication procurement Discussed physical therapy support. Client is receiving, as scheduled, dry needling for physical therapy support Discussed vision of client. She wears glasses.  Discussed client support with PCP, D. Danise Edge. Encouraged Grace to call LCSW as needed for SW support at [phone]          Our next appointment is by telephone on 05/10/23 at 2:30 PM   Please call the care guide team at [phone] if you need to cancel or reschedule your appointment.   If you are experiencing a Mental Health or Behavioral Health Crisis or need someone to talk to, please go to [facility] [address], [place] [phone])   The patient verbalized understanding of instructions, educational materials, and care plan provided today and DECLINED offer to receive copy of patient instructions, educational materials, and care plan.   The patient has been provided with contact information for the care management team and has been advised to call with any health related questions or concerns.   Kelton Pillar.Afsheen Antony MSW,  LCSW Licensed Visual merchandiser [facility] Care Management [phone]

## 2023-03-30 ENCOUNTER — Ambulatory Visit: Payer: Medicare Other | Attending: Cardiology | Admitting: Cardiology

## 2023-03-30 ENCOUNTER — Encounter: Payer: Self-pay | Admitting: Cardiology

## 2023-03-30 VITALS — BP 110/60 | HR 90 | Ht 62.0 in | Wt 111.8 lb

## 2023-03-30 DIAGNOSIS — I25709 Atherosclerosis of coronary artery bypass graft(s), unspecified, with unspecified angina pectoris: Secondary | ICD-10-CM | POA: Diagnosis not present

## 2023-03-30 DIAGNOSIS — I10 Essential (primary) hypertension: Secondary | ICD-10-CM | POA: Insufficient documentation

## 2023-03-30 DIAGNOSIS — I2510 Atherosclerotic heart disease of native coronary artery without angina pectoris: Secondary | ICD-10-CM | POA: Insufficient documentation

## 2023-03-30 DIAGNOSIS — E785 Hyperlipidemia, unspecified: Secondary | ICD-10-CM | POA: Diagnosis not present

## 2023-03-30 DIAGNOSIS — Z79899 Other long term (current) drug therapy: Secondary | ICD-10-CM | POA: Diagnosis not present

## 2023-03-30 DIAGNOSIS — R0602 Shortness of breath: Secondary | ICD-10-CM | POA: Insufficient documentation

## 2023-03-30 LAB — D-DIMER, QUANTITATIVE: D-DIMER: 0.55 mg/L FEU — ABNORMAL HIGH (ref 0.00–0.49)

## 2023-03-30 LAB — BASIC METABOLIC PANEL
BUN/Creatinine Ratio: 31 — ABNORMAL HIGH (ref 12–28)
BUN: 23 mg/dL (ref 8–27)
CO2: 30 mmol/L — ABNORMAL HIGH (ref 20–29)
Calcium: 9.8 mg/dL (ref 8.7–10.3)
Chloride: 94 mmol/L — ABNORMAL LOW (ref 96–106)
Creatinine, Ser: 0.74 mg/dL (ref 0.57–1.00)
Glucose: 94 mg/dL (ref 70–99)
Potassium: 3.9 mmol/L (ref 3.5–5.2)
Sodium: 130 mmol/L — ABNORMAL LOW (ref 134–144)
eGFR: 77 mL/min/{1.73_m2} (ref >59)

## 2023-03-30 MED ORDER — METOPROLOL TARTRATE 100 MG PO TABS: 100.0000 mg | ORAL_TABLET | Freq: Once | ORAL | 0 refills | Status: DC

## 2023-03-30 NOTE — Progress Notes (Signed)
Date:  03/30/2023   ID:  Stephanie [name], DOB 1932-03-05, MRN [id number] The patient was identified using 2 identifiers. PCP:  Bradd Canary, MD  Cardiologist:  Armanda Magic, MD  Electrophysiologist:  None   Evaluation Performed:  Follow-Up Visit  Chief Complaint:  HTN  History of Present Illness:    Stephanie [name] is a 89 y.o. female with a hx of HTN, psoriatic arthritis and HLD.  She is here today for followup and is doing well.  Since I saw her last she has had a shoulder replacement.  She had been doing PT and when she walked down the hallway she was very SOB and had to sit down and her pulse was elevated.  She was having SOB prior to surgery as well.  A 2D echo last fall was normal.  She denies any chest pain or pressure, PND, orthopnea, LE edema, dizziness, palpitations or syncope. She is compliant with her meds and is tolerating meds with no SE.    Past Medical History:  Diagnosis Date   Allergy    Anemia    past hx    Arthropathy of cervical spine 11/27/2012   Right  Xray at [facility]  On 04/08/2016  AP, lateral, and lateral flexion and extension views of the cervical spine are submitted for evaluation.   No acute fracture identified in the cervical spine.   There is redemonstration of approximately 2 mm of C3 on C4 anterolisthesis in neutral which slightly increases in flexion relative to neutral and extension. Slight C5 on C6 retrolisthesis does not change i   Benign hypertension without congestive heart failure    Benign mole    right hcets mole, bleeds when dried with towel   Bilateral dry eyes    Bladder cancer (HCC) Oct 27, 2016   Cervical spondylosis    Cervicalgia    2 screws in place    Depression with anxiety 10-27-2016   prior to husbands death    Dyspnea    Gross hematuria    developed after first hip replacment, led to indicental finding of bladder tumor, bleeding now resolved    H/O total shoulder replacement, left    History of kidney stones    1970's   Left  hand weakness    due to reinjury of flxor tendon s/p tendon surgery march 12-2018   Lumbar stenosis    Malignant tumor of urinary bladder (HCC) 07/2016   Pre-stage I   OA (osteoarthritis)    Parkinson disease dx 2012   neurologist-  dr siddiqui at [facility]   Psoriatic arthritis [facility])     Dr Nickola Major , [place] [facility]    Rupture of flexor tendon of hand 2020   right   Past Surgical History:  Procedure Laterality Date   BIOPSY MASS LEFT FIRST METACARPAL   06/23/2006   benign   CATARACT EXTRACTION W/ INTRAOCULAR LENS  IMPLANT, BILATERAL  1999 and 2003   CERVICAL FUSION  02/2017   c1-c2 ; reports she has 2 screws 2in long in place done at [facility] ; patient exhibits VERY LIMITED NECK ROM   COLONOSCOPY     CYSTOSCOPY W/ RETROGRADES Bilateral 09/22/2016   Procedure: CYSTOSCOPY WITH RETROGRADE PYELOGRAM;  Surgeon: Sebastian Ache, MD;  Location: [facility];  Service: Urology;  Laterality: Bilateral;   D & C HYSTEROSCOPY W/ RESECTION POLYP  07/11/2000   DILATION AND CURETTAGE OF UTERUS  1960's   EXCISION CYST AND DEBRIDEMENT RIGHT WIRST AND REMOVAL  FORGEIGN BODY  01/09/2009   FLEXOR TENDON REPAIR Left 12/12/2018   Procedure: REPAIR/TRANSFER FLEXOR DIGITORUM PROFUNDUS OF LEFT SMALL FINGER;  Surgeon: Cindee Salt, MD;  Location: [facility];  Service: Orthopedics;  Laterality: Left;   LAPAROSCOPY  yrs ago   infertility    METACARPOPHALANGEAL JOINT ARTHRODESIS Right 05/25/1996   REVERSE SHOULDER ARTHROPLASTY Left 10/14/2022   Procedure: REVERSE SHOULDER ARTHROPLASTY;  Surgeon: Francena Hanly, MD;  Location: [facility];  Service: Orthopedics;  Laterality: Left;    TENDON REPAIR Left 01/15/2019   Hand   TONSILLECTOMY AND ADENOIDECTOMY  child   TOTAL HIP ARTHROPLASTY Left 07/07/2016   Procedure: LEFT TOTAL HIP ARTHROPLASTY ANTERIOR APPROACH;  Surgeon: Ollen Gross, MD;  Location: [facility];  Service: Orthopedics;  Laterality: Left;   TOTAL HIP ARTHROPLASTY Right  09/28/2017   Procedure: RIGHT TOTAL HIP ARTHROPLASTY ANTERIOR APPROACH;  Surgeon: Ollen Gross, MD;  Location: [facility];  Service: Orthopedics;  Laterality: Right;   TOTAL KNEE ARTHROPLASTY Right 07/30/2019   Procedure: TOTAL KNEE ARTHROPLASTY;  Surgeon: Ollen Gross, MD;  Location: [facility];  Service: Orthopedics;  Laterality: Right;    TRANSURETHRAL RESECTION OF BLADDER TUMOR N/A 09/22/2016   Procedure: TRANSURETHRAL RESECTION OF BLADDER TUMOR (TURBT);  Surgeon: Sebastian Ache, MD;  Location: [facility];  Service: Urology;  Laterality: N/A;     Current Meds  Medication Sig   amLODipine (NORVASC) 5 MG tablet TAKE 1 TABLET (5 MG TOTAL) BY MOUTH DAILY.   Apoaequorin (PREVAGEN PO) Take 1 tablet by mouth in the morning.   carbidopa-levodopa (SINEMET IR) 25-100 MG per tablet Take 0.5-1 tablets by mouth See admin instructions. Take 1 tablet by mouth in the morning upon waking & take 0.5 tablet by mouth every 2 hours thereafter for a total of 6 tablets daily.   Carbidopa-Levodopa ER (SINEMET CR) 25-100 MG tablet controlled release Take 1 tablet by mouth at bedtime.   cholecalciferol (VITAMIN D3) 25 MCG (1000 UNIT) tablet Take 1,000 Units by mouth in the morning.   cholestyramine (QUESTRAN) 4 g packet Take 1 packet (4 g total) by mouth 2 (two) times daily as needed.   Coenzyme Q10 (COQ10 PO) Take 1 tablet by mouth in the morning.   diazepam (VALIUM) 5 MG tablet TAKE 1 TABLET (5 MG TOTAL) BY MOUTH EVERY 12 (TWELVE) HOURS AS NEEDED. FOR ANXIETY   DIGESTIVE ENZYMES PO Take 1 capsule by mouth 3 (three) times daily before meals. Active Enzymes by Leotis Shames   EPINEPHrine 0.3 mg/0.3 mL IJ SOAJ injection Inject 0.3 mg into the muscle as needed for anaphylaxis.   fluticasone (CUTIVATE) 0.05 % cream Apply 1 application topically daily as needed (scalp psoriasis).   golimumab (SIMPONI ARIA) 50 MG/4ML SOLN injection Inject 50 mg into the vein See admin instructions. Given every 2 months -  Last given 06/28/2019   hydrochlorothiazide (HYDRODIURIL) 25 MG tablet Take 1 tablet (25 mg total) by mouth daily.   melatonin 5 MG TABS Take 5 mg by mouth at bedtime.   meloxicam (MOBIC) 15 MG tablet Take 15 mg by mouth daily as needed (joint pain (max 3-4 times/weekly)).   Multiple Vitamin (MULTIVITAMIN WITH MINERALS) TABS tablet Take 1 tablet by mouth in the morning. Centrum Silver   Polyethyl Glycol-Propyl Glycol (SYSTANE) 0.4-0.3 % SOLN Place 1-2 drops into both eyes 3 (three) times daily as needed (dry/irritated eyes.).   Probiotic Product (PROBIOTIC PO) Take 1 capsule by mouth in the morning and at bedtime. Restore Ultimate Probiotic  tiZANidine (ZANAFLEX) 4 MG tablet Take 0.5-1 tablets (2-4 mg total) by mouth every 8 (eight) hours as needed for muscle spasms.     Allergies:   Cleocin [clindamycin], Codeine, Hornet venom, Penicillins, Vibramycin [doxycycline], Yellow jacket venom, Lodine [etodolac], and Tramadol   Social History   Tobacco Use   Smoking status: Former    Years: 10    Types: Cigarettes    Quit date: 05/04/1964    Years since quitting: 58.9   Smokeless tobacco: Never   Tobacco comments:    Pt states that when she was smoking 1 pack of cigs would last her 3 days. 10/13/22 ALS   Vaping Use   Vaping Use: Never used  Substance Use Topics   Alcohol use: Yes    Alcohol/week: 7.0 standard drinks of alcohol    Types: 7 Glasses of wine per week    Comment: ocassionally- social    Drug use: No     Family Hx: The patient's family history includes Arthritis in her father, mother, and sister; Bladder Cancer in her father; Deep vein thrombosis in her mother; Diabetes in her sister; Esophageal cancer in her paternal aunt; Heart disease in her maternal grandfather, maternal grandmother, and sister; Hypertension in her mother and sister; Obesity in her sister; Peripheral vascular disease in her paternal grandmother; Stroke in her father and paternal grandfather. There is no  history of Colon cancer, Colon polyps, Rectal cancer, or Stomach cancer.  ROS:   Please see the history of present illness.     All other systems reviewed and are negative.   Prior CV studies:   The following studies were reviewed today:    Labs/Other Tests and Data Reviewed:    EKG:  No ECG reviewed.  Recent Labs: 10/05/2022: NT-Pro BNP 284 02/28/2023: ALT 7; BUN 23; Creatinine, Ser 0.76; Hemoglobin 13.7; Platelets 244.0; Potassium 4.1; Sodium 133; TSH 1.19   Recent Lipid Panel Lab Results  Component Value Date/Time   CHOL 155 02/28/2023 10:56 AM   CHOL 173 12/31/2019 08:52 AM   TRIG 103.0 02/28/2023 10:56 AM   HDL 56.50 02/28/2023 10:56 AM   HDL 58 12/31/2019 08:52 AM   CHOLHDL 3 02/28/2023 10:56 AM   LDLCALC 78 02/28/2023 10:56 AM   LDLCALC 113 (H) 09/02/2020 04:00 PM   LDLDIRECT 120.0 12/11/2014 10:40 AM    Wt Readings from Last 3 Encounters:  03/30/23 111 lb 12.8 oz (50.7 kg)  02/28/23 111 lb 6.4 oz (50.5 kg)  11/29/22 119 lb (54 kg)     Risk Assessment/Calculations:      Objective:    Vital Signs:  BP 110/60   Pulse 90   Ht 5\' 2"  (1.575 m)   Wt 111 lb 12.8 oz (50.7 kg)   LMP 01/13/2013 Comment: spotting-had benign endometrial biopsy   SpO2 98%   BMI 20.45 kg/m   GEN: Well nourished, well developed in no acute distress HEENT: Normal NECK: No JVD; No carotid bruits LYMPHATICS: No lymphadenopathy CARDIAC:RRR, no murmurs, rubs, gallops RESPIRATORY:  Clear to auscultation without rales, wheezing or rhonchi  ABDOMEN: Soft, non-tender, non-distended MUSCULOSKELETAL:  No edema; No deformity  SKIN: Warm and dry NEUROLOGIC:  Alert and oriented x 3 PSYCHIATRIC:  Normal affect   ASSESSMENT & PLAN:    1.  HTN -BP is welcomed on exam today -Continue prescription drug management with amlodipine 5 mg daily, HCTZ 25 mg daily with as needed refills -I have personally reviewed and interpreted outside labs performed by patient's PCP which showed  serum  creatinine 0.76 and potassium 4.1 on 02/28/2023  2.  HLD -LDL goal < 100 -I have personally reviewed and interpreted outside labs performed by patient's PCP which showed LDL 78 and HDL 56 on 02/28/2023 -encouraged her to cut back on fats and carbs and eat more fruits and veggies  3. SOB -unclear etiology -check ddimer -will get a coronary CTA to define coronary anatomy and rule out CAD -repeat 2D echo   Medication Adjustments/Labs and Tests Ordered: Current medicines are reviewed at length with the patient today.  Concerns regarding medicines are outlined above.   Tests Ordered: No orders of the defined types were placed in this encounter.   Medication Changes: No orders of the defined types were placed in this encounter.   Follow Up:  1 year   Signed, Armanda Magic, MD  03/30/2023 1:25 PM    [facility]

## 2023-03-30 NOTE — Patient Instructions (Signed)
Medication Instructions:  Your physician recommends that you continue on your current medications as directed. Please refer to the Current Medication list given to you today.  *If you need a refill on your cardiac medications before your next appointment, please call your pharmacy*   Lab Work: Please complete a D-Dimer and a BMET in our lab before you leave today.  If you have labs (blood work) drawn today and your tests are completely normal, you will receive your results only by: MyChart Message (if you have MyChart) OR A paper copy in the mail If you have any lab test that is abnormal or we need to change your treatment, we will call you to review the results.   Testing/Procedures: Your physician has requested that you have an echocardiogram. Echocardiography is a painless test that uses sound waves to create images of your heart. It provides your doctor with information about the size and shape of your heart and how well your heart's chambers and valves are working. This procedure takes approximately one hour. There are no restrictions for this procedure. Please do NOT wear cologne, perfume, aftershave, or lotions (deodorant is allowed). Please arrive 15 minutes prior to your appointment time.    Your cardiac CT will be scheduled at :   [facility] [address] [place], [place] [address] [phone]    Please arrive at the [facility] and Children's Entrance (Entrance C2) of [facility] 30 minutes prior to test start time. You can use the FREE valet parking offered at entrance C (encouraged to control the heart rate for the test)  Proceed to the [facility] Radiology Department (first floor) to check-in and test prep.  All radiology patients and guests should use entrance C2 at [facility], accessed from [facility], even though the hospital's physical address listed is [address].      Please follow these instructions  carefully (unless otherwise directed):  Hold all erectile dysfunction medications at least 3 days (72 hrs) prior to test. (Ie viagra, cialis, sildenafil, tadalafil, etc) We will administer nitroglycerin during this exam.   On the Night Before the Test: Be sure to Drink plenty of water. Do not consume any caffeinated/decaffeinated beverages or chocolate 12 hours prior to your test. Do not take any antihistamines 12 hours prior to your test.  On the Day of the Test: Drink plenty of water until 1 hour prior to the test. Do not eat any food 1 hour prior to test. You may take your regular medications prior to the test.  Take 100 mg metoprolol tablet (Lopressor) two hours prior to test. If you take Furosemide/Hydrochlorothiazide/Spironolactone, please HOLD on the morning of the test. FEMALES- please wear underwire-free bra if available, avoid dresses & tight clothing            After the Test: Drink plenty of water. After receiving IV contrast, you may experience a mild flushed feeling. This is normal. On occasion, you may experience a mild rash up to 24 hours after the test. This is not dangerous. If this occurs, you can take Benadryl 25 mg and increase your fluid intake. If you experience trouble breathing, this can be serious. If it is severe call 911 IMMEDIATELY. If it is mild, please call our office. If you take any of these medications: Glipizide/Metformin, Avandament, Glucavance, please do not take 48 hours after completing test unless otherwise instructed.  We will call to schedule your test 2-4 weeks out understanding  that some insurance companies will need an authorization prior to the service being performed.   For non-scheduling related questions, please contact the cardiac imaging nurse navigator should you have any questions/concerns: Rockwell Alexandria, Cardiac Imaging Nurse Navigator Larey Brick, Cardiac Imaging Nurse Navigator [facility] Direct  Office Dial: [phone]   For scheduling needs, including cancellations and rescheduling, please call [place], [phone].    Follow-Up: At [facility], you and your health needs are our priority.  As part of our continuing mission to provide you with exceptional heart care, we have created designated Provider Care Teams.  These Care Teams include your primary Cardiologist (physician) and Advanced Practice Providers (APPs -  Physician Assistants and Nurse Practitioners) who all work together to provide you with the care you need, when you need it.  We recommend signing up for the patient portal called "MyChart".  Sign up information is provided on this After Visit Summary.  MyChart is used to connect with patients for Virtual Visits (Telemedicine).  Patients are able to view lab/test results, encounter notes, upcoming appointments, etc.  Non-urgent messages can be sent to your provider as well.   To learn more about what you can do with MyChart, go to [email or web address].    Your next appointment:   1 year(s)  Provider:   Armanda Magic, MD

## 2023-03-30 NOTE — Addendum Note (Signed)
Addended by: Luellen Pucker on: 03/30/2023 01:39 PM   Modules accepted: Orders

## 2023-03-31 ENCOUNTER — Ambulatory Visit: Payer: Medicare Other | Admitting: Physical Therapy

## 2023-03-31 ENCOUNTER — Telehealth: Payer: Self-pay

## 2023-03-31 DIAGNOSIS — M542 Cervicalgia: Secondary | ICD-10-CM | POA: Diagnosis not present

## 2023-03-31 DIAGNOSIS — R293 Abnormal posture: Secondary | ICD-10-CM

## 2023-03-31 DIAGNOSIS — M62838 Other muscle spasm: Secondary | ICD-10-CM

## 2023-03-31 DIAGNOSIS — M25612 Stiffness of left shoulder, not elsewhere classified: Secondary | ICD-10-CM | POA: Diagnosis not present

## 2023-03-31 DIAGNOSIS — M6281 Muscle weakness (generalized): Secondary | ICD-10-CM

## 2023-03-31 NOTE — Telephone Encounter (Signed)
Advised patient that D-Dimer is normal, patient verbalized understanding.

## 2023-03-31 NOTE — Therapy (Addendum)
OUTPATIENT PHYSICAL THERAPY TREATMENT   Patient Name: Stephanie [name] MRN: [id number] DOB:12/22/1931, 89 y.o., female Today's Date: 03/31/2023   END OF SESSION:  PT End of Session - 03/31/23 1100     Visit Number 7    Number of Visits 20    Date for PT Re-Evaluation 04/18/23    Authorization Type Medicare & BCBS    Progress Note Due on Visit 10    PT Start Time 1100    PT Stop Time 1207    PT Time Calculation (min) 67 min    Activity Tolerance Patient tolerated treatment well;Patient limited by pain    Behavior During Therapy [facility] for tasks assessed/performed              Past Medical History:  Diagnosis Date   Allergy    Anemia    past hx    Arthropathy of cervical spine 11/27/2012   Right  Xray at [facility]  On 04/08/2016  AP, lateral, and lateral flexion and extension views of the cervical spine are submitted for evaluation.   No acute fracture identified in the cervical spine.   There is redemonstration of approximately 2 mm of C3 on C4 anterolisthesis in neutral which slightly increases in flexion relative to neutral and extension. Slight C5 on C6 retrolisthesis does not change i   Benign hypertension without congestive heart failure    Benign mole    right hcets mole, bleeds when dried with towel   Bilateral dry eyes    Bladder cancer (HCC) 2016-10-25   Cervical spondylosis    Cervicalgia    2 screws in place    Depression with anxiety 10/25/2016   prior to husbands death    Dyspnea    Gross hematuria    developed after first hip replacment, led to indicental finding of bladder tumor, bleeding now resolved    H/O total shoulder replacement, left    History of kidney stones    1970's   Left hand weakness    due to reinjury of flxor tendon s/p tendon surgery march 12-2018   Lumbar stenosis    Malignant tumor of urinary bladder (HCC) 07/2016   Pre-stage I   OA (osteoarthritis)    Parkinson disease dx 2012   neurologist-  dr siddiqui at [facility]   Psoriatic  arthritis [facility])     Dr Nickola Major , [place] [facility]    Rupture of flexor tendon of hand 2020   right   Past Surgical History:  Procedure Laterality Date   BIOPSY MASS LEFT FIRST METACARPAL   06/23/2006   benign   CATARACT EXTRACTION W/ INTRAOCULAR LENS  IMPLANT, BILATERAL  1999 and 2003   CERVICAL FUSION  02/2017   c1-c2 ; reports she has 2 screws 2in long in place done at [facility] ; patient exhibits VERY LIMITED NECK ROM   COLONOSCOPY     CYSTOSCOPY W/ RETROGRADES Bilateral 09/22/2016   Procedure: CYSTOSCOPY WITH RETROGRADE PYELOGRAM;  Surgeon: Sebastian Ache, MD;  Location: [facility];  Service: Urology;  Laterality: Bilateral;   D & C HYSTEROSCOPY W/ RESECTION POLYP  07/11/2000   DILATION AND CURETTAGE OF UTERUS  1960's   EXCISION CYST AND DEBRIDEMENT RIGHT WIRST AND REMOVAL FORGEIGN BODY  01/09/2009   FLEXOR TENDON REPAIR Left 12/12/2018   Procedure: REPAIR/TRANSFER FLEXOR DIGITORUM PROFUNDUS OF LEFT SMALL FINGER;  Surgeon: Cindee Salt, MD;  Location: [facility];  Service: Orthopedics;  Laterality: Left;   LAPAROSCOPY  yrs ago  infertility    METACARPOPHALANGEAL JOINT ARTHRODESIS Right 05/25/1996   REVERSE SHOULDER ARTHROPLASTY Left 10/14/2022   Procedure: REVERSE SHOULDER ARTHROPLASTY;  Surgeon: Francena Hanly, MD;  Location: [facility];  Service: Orthopedics;  Laterality: Left;    TENDON REPAIR Left 01/15/2019   Hand   TONSILLECTOMY AND ADENOIDECTOMY  child   TOTAL HIP ARTHROPLASTY Left 07/07/2016   Procedure: LEFT TOTAL HIP ARTHROPLASTY ANTERIOR APPROACH;  Surgeon: Ollen Gross, MD;  Location: [facility];  Service: Orthopedics;  Laterality: Left;   TOTAL HIP ARTHROPLASTY Right 09/28/2017   Procedure: RIGHT TOTAL HIP ARTHROPLASTY ANTERIOR APPROACH;  Surgeon: Ollen Gross, MD;  Location: [facility];  Service: Orthopedics;  Laterality: Right;   TOTAL KNEE ARTHROPLASTY Right 07/30/2019   Procedure: TOTAL KNEE ARTHROPLASTY;  Surgeon: Ollen Gross, MD;   Location: [facility];  Service: Orthopedics;  Laterality: Right;    TRANSURETHRAL RESECTION OF BLADDER TUMOR N/A 09/22/2016   Procedure: TRANSURETHRAL RESECTION OF BLADDER TUMOR (TURBT);  Surgeon: Sebastian Ache, MD;  Location: [facility];  Service: Urology;  Laterality: N/A;   Patient Active Problem List   Diagnosis Date Noted   S/P reverse total shoulder arthroplasty, left 10/14/2022   Preoperative cardiovascular examination 10/04/2022   SOB (shortness of breath) 08/02/2022   Insomnia 08/02/2022   Hyponatremia 08/01/2022   Shoulder pain, left 09/14/2021   History of COVID-19 01/06/2021   Diarrhea 09/03/2020   Vitamin D deficiency 09/03/2020   OA (osteoarthritis) of knee 07/30/2019   Tinea corporis 06/08/2019   Psoriatic arthritis (HCC) 04/02/2019   Anemia 03/13/2018   Hot flashes 03/13/2018   S/P cervical spinal fusion 03/31/2017   Neck pain 12/12/2016   Depression with anxiety 10/17/2016   Bladder cancer (HCC) 10/17/2016   OA (osteoarthritis) of hip 07/07/2016   Spinal stenosis of lumbar region with radiculopathy 04/08/2016   Spondylolisthesis of lumbar region 04/08/2016   Spondylosis of cervical region without myelopathy or radiculopathy 04/08/2016   Preventative health care 01/18/2016   Low back pain 01/18/2016   History of chicken pox    Hyperlipidemia 12/15/2014   Allergic rhinitis 06/25/2014   Allergy to bee sting 06/10/2014   TMJ tenderness 11/16/2013   Other malaise and fatigue 05/07/2013   Arthropathy of cervical spine (HCC) 11/27/2012   Muscle spasms of neck 11/27/2012   Parkinson's disease 05/24/2012   Conjunctivochalasis 03/09/2012   Epiretinal membrane 03/09/2012   Hyperopia with astigmatism and presbyopia 03/09/2012   Pseudophakia 03/09/2012   HTN (hypertension) 08/31/2011   Right knee pain 08/31/2011   Benign hypertensive heart disease without heart failure 05/12/2011    PCP: Bradd Canary, MD   REFERRING PROVIDER: Romero Belling,  MD   REFERRING DIAG: Multilevel cervical spondylosis, L neck & trapezial myofascial pain/contracture   THERAPY DIAG:  Cervicalgia  Abnormal posture  Muscle weakness (generalized)  Other muscle spasm  RATIONALE FOR EVALUATION AND TREATMENT: Rehabilitation  ONSET DATE: acute on chronic  NEXT MD VISIT: ~June 2024   SUBJECTIVE:  SUBJECTIVE STATEMENT: Pt reports pain up to 10/10 while sleeping a few nights ago and had to take a meloxicam (she had stopped taking some of the meds to GI upset). Slept a little better last night.   PAIN: Are you having pain? Yes: NPRS scale: 4-5/10 Pain location: L neck and scapula Pain description: constant sharp Aggravating factors: any movement of head and neck, dyskinesia from PD meds Relieving factors: sitting still in her recliner  PERTINENT HISTORY:  Chronic neck pain, L shoulder advanced OA s/p reverse TSA on 10/14/22; R TKA 07/2019, B THR 2018 & 2017, posterior C1-2 cervical fusion 2018, rupture of flexor tendon of L hand with failed surgical repair 2020, Parkinson's, psoriatic arthritis, bladder cancer, HTN    PRECAUTIONS: None  HAND DOMINANCE: Right  WEIGHT BEARING RESTRICTIONS: No  FALLS:  Has patient fallen in last 6 months? No  LIVING ENVIRONMENT: Lives with: lives alone Lives in: House/apartment Stairs: Yes: External: 4 steps; on right going up, on left going up, and can reach both Has following equipment at home: Single point cane, Walker - 2 wheeled, Shower bench, bed side commode, and Grab bars  OCCUPATION: Retired  [employer]: Independent and Leisure: volunteering at [employer], bible study, walking 1/3 mile daily - unable recently due to pain   PATIENT GOALS: "Get rid of this pain so I can go back to my normal  activities."   OBJECTIVE: (objective measures completed at initial evaluation unless otherwise dated)  DIAGNOSTIC FINDINGS:  Cervical MRI - results not available, but pt reports she was told she had severe OA  02/07/23 - Cervical spine x-ray: IMPRESSION: Multilevel degenerative change and postoperative change without acute abnormality.  PATIENT SURVEYS:  NDI 26 / 50 = 52.0 %  COGNITION: Overall cognitive status: Within functional limits for tasks assessed  SENSATION: WFL  POSTURE:  rounded shoulders, forward head, weight shift right, and head laterally shifted to R  PALPATION: Severe increased muscle tension and TTP in L>R UT, LS and cervical paraspinals as well as L periscapular muscles   CERVICAL ROM:   Active ROM AROM  eval  Flexion 46  Extension 22 *  Right lateral flexion 16 *  Left lateral flexion 10 *  Right rotation 16 *  Left rotation 18*   (Blank rows = not tested, * = pain) 02/21/23 - clicking with B rotation  UPPER EXTREMITY ROM:  Active ROM Right eval Left eval  Shoulder flexion 146 111  Shoulder extension 50 42  Shoulder abduction 155 118  Shoulder adduction    Shoulder internal rotation FIR to sacrum FIR to lateral buttock  Shoulder external rotation FER T3 FER T1  Elbow flexion    Elbow extension    Wrist flexion    Wrist extension    Wrist ulnar deviation    Wrist radial deviation    Wrist pronation    Wrist supination     (Blank rows = not tested)  UPPER EXTREMITY MMT:   MMT Right 03/03/23 Left 03/03/23  Shoulder flexion 4+ 4-  Shoulder extension 4+ 4+  Shoulder abduction 4+ 4  Shoulder adduction    Shoulder internal rotation 4+ 4  Shoulder external rotation 4 4   (Blank rows = not tested)  CERVICAL SPECIAL TESTS:  Spurling's test: Positive and Distraction test: Positive   TODAY'S TREATMENT:   03/31/23 THERAPEUTIC EXERCISE: to improve flexibility, strength and mobility.  Verbal and tactile cues throughout for technique.  UBE:  L2.0 x 6 min (3' each fwd & back) -  more comfortable going backwards Seated neck isometrics 10 sec hold x 5 ea - ext, retraction, SB and rot - pt noting the best relief with R SB  MANUAL THERAPY: To promote normalized muscle tension, improved flexibility, improved joint mobility, increased ROM, reduced pain, and improved posture . Skilled palpation and monitoring of soft tissue during DN Trigger Point Dry-Needling  Treatment instructions: Expect mild to moderate muscle soreness. S/S of pneumothorax if dry needled over a lung field, and to seek immediate medical attention should they occur. Patient verbalized understanding of these instructions and education. Patient Consent Given: Yes Education handout provided: Previously provided Muscles treated: L UT, LS and scalenes Electrical stimulation performed: No Parameters: N/A Treatment response/outcome: Twitch Response Elicited and Palpable Increase in Muscle Length STM/DTM, manual TPR and pin & stretch to muscles addressed with DN as well as L cervicothoracic paraspinals and periscapular muscles L subscapular release and scapular mobs in R S/L  MODALITIES: IFC to L lateral neck and periscapular muscles, 80-150 Hz, intensity to patient tolerance x 12 min  Moist heat to L neck and upper shoulder during estim   03/21/23 THERAPEUTIC EXERCISE: to improve flexibility, strength and mobility.  Verbal and tactile cues throughout for technique.  UBE: L1.0 x 6 min (3' each fwd & back) - more comfortable going backwards Seated neck isometrics 10 sec hold x 5 ea - ext. SB, rot and flexion Seated chin tuck into ball x 10 Seated left neck rotation x 10 (pt looking to a target)  Manual Therapy: to decrease muscle spasm and pain and improve mobility TPR  to left LS and cervical muscles as tolerated  Positioning: towel around neck, left SDLY. Tolerated in neck but painful in hip.Asked pt to try at home on bed.  Modalities: IFC estim to left neck/UT/LS x 10  min in sitting with MHP to neck   03/17/23 THERAPEUTIC EXERCISE: to improve flexibility, strength and mobility.  Verbal and tactile cues throughout for technique.  UBE: L1.0 x 6 min (3' each fwd & back) - more comfortable going backwards Seated left lumbar stretch - painful in left neck S/L over yoga mat at just above hip level - minimal stretch of lumbar Passive stretch in above position was effective.  Supine active left rotation 5 sec hold x 5, also tried with TPR to SCM - too painful  MANUAL THERAPY: To promote normalized muscle tension, improved flexibility, improved joint mobility, increased ROM, and reduced pain. IASTM with EDGE tool to B UT, LS, cervical multifidi; L scalenes, rhomboids, SCM, left lumbar/QL. TPR to SCM in supine   PATIENT EDUCATION:  Education details: HEP review and HEP modification - deferred SCM stretch   Person educated: Patient Education method: Explanation, Demonstration, and Handouts Education comprehension: verbalized understanding  HOME EXERCISE PROGRAM: Access Code: [email or web address] URL: [email or web address] Date: 03/21/2023 Prepared by: Raynelle Fanning  Exercises - Seated Cervical Retraction  - 2-3 x daily - 7 x weekly - 2 sets - 10 reps - 3-5 sec hold - Shoulder Rolls in Sitting  - 2-3 x daily - 7 x weekly - 2 sets - 10 reps - 3 sec hold - Seated Scapular Retraction  - 2-3 x daily - 7 x weekly - 2 sets - 10 reps - 5 sec hold - Sternocleidomastoid Stretch  - 2-3 x daily - 7 x weekly - 3 reps - 30 sec hold - Standing Bilateral Low Shoulder Row with Anchored Resistance  - 1 x daily - 3-4 x weekly - 2 sets - 10  reps - 5 sec hold - Scapular Retraction with Resistance Advanced  - 1 x daily - 3-4 x weekly - 2 sets - 10 reps - 5 sec hold - Seated Shoulder Flexion Full Range (Mirrored)  - 1 x daily - 3-4 x weekly - 2 sets - 10 reps - 3 sec hold - Seated Single Arm Shoulder Abduction - Thumb Up  - 1 x daily - 3-4 x weekly - 2 sets - 10 reps - 3 sec hold -  Shoulder Internal Rotation with Resistance (Mirrored)  - 1 x daily - 3 x weekly - 1-2 sets - 10 reps - 3 sec hold - Shoulder External Rotation with Anchored Resistance  - 1 x daily - 3 x weekly - 1-2 sets - 10 reps - 3 sec hold - Standing Isometric Cervical Sidebending with Manual Resistance  - 1 x daily - 7 x weekly - 1 sets - 5 reps - 10 sec hold hold - Standing Isometric Cervical Flexion with Manual Resistance  - 1 x daily - 7 x weekly - 1 sets - 5 reps - 10 sec hold - Standing Isometric Cervical Extension with Manual Resistance  - 1 x daily - 7 x weekly - 1 sets - 5 reps - 10 sec hold - Seated Isometric Cervical Rotation  - 1 x daily - 7 x weekly - 1 sets - 5 reps - 10 sec hold   ASSESSMENT:  CLINICAL IMPRESSION: Emme presents to PT today with more pronounce forward flexed head/neck posture today, stating that this makes her neck feel better. Pain remains variable but can still get very intense, up to 10/10 a few nights ago while trying to sleep. Increased muscle tension and TTP noted in L UT, LS, cervicothoracic paraspinals and periscapular muscles evident today - addressed with MT incorporating DN as well as scapular mobs to promote muscle relaxation and better postural muscle engagement. Reviewed cervical isometrics, emphasizing lateral and posterior muscle engagement for better head control. Session concluded with estim and MHP to promote further muscle relaxation and pain relief. [place] will continue to benefit from skilled PT to address ongoing pain, ROM and strength deficits to improve mobility and activity tolerance with decreased pain interference. She will likely benefit from recert at end of current POC.  OBJECTIVE IMPAIRMENTS: decreased activity tolerance, decreased endurance, decreased knowledge of condition, decreased mobility, difficulty walking, decreased ROM, decreased strength, hypomobility, increased fascial restrictions, impaired perceived functional ability, increased muscle  spasms, impaired flexibility, impaired UE functional use, improper body mechanics, postural dysfunction, and pain.   ACTIVITY LIMITATIONS: carrying, lifting, standing, sleeping, transfers, bed mobility, bathing, toileting, dressing, reach over head, and hygiene/grooming  PARTICIPATION LIMITATIONS: meal prep, cleaning, laundry, driving, shopping, community activity, occupation, yard work, and church  PERSONAL FACTORS: Age, Fitness, Past/current experiences, Time since onset of injury/illness/exacerbation, and 3+ comorbidities: Chronic neck pain, L shoulder advanced OA s/p reverse TSA on 10/14/22; R TKA 07/2019, B THR 2018 & 2017, posterior C1-2 cervical fusion 2018, rupture of flexor tendon of L hand with failed surgical repair 2020, Parkinson's, psoriatic arthritis, bladder cancer, HTN    are also affecting patient's functional outcome.   REHAB POTENTIAL: Good  CLINICAL DECISION MAKING: Evolving/moderate complexity  EVALUATION COMPLEXITY: Moderate   GOALS: Goals reviewed with patient? Yes  SHORT TERM GOALS: Target date: 03/21/2023  Patient will be independent with initial HEP to improve outcomes and carryover.  Baseline: Established 03/03/23 Goal status: MET  03/31/23  2.  Patient will report 25% improvement in neck  pain to improve QOL.   Baseline: 8-10/10 Goal status: IN PROGRESS  03/21/23 - no change in pain  LONG TERM GOALS: Target date: 04/18/2023  Patient will be independent with ongoing/advanced HEP for self-management at home.  Baseline:  Goal status: IN PROGRESS  2.  Patient will demonstrate improved posture to decrease muscle imbalance. Baseline: rounded shoulders, forward head, weight shift right, and head laterally shifted to R Goal status: IN PROGRESS  03/31/23 - still with significant forward head and flexed cervical and upper thoracic posture  3.  Patient will report 50-75% improvement in neck pain to improve QOL.  Baseline: 8-10/10 Goal status: IN PROGRESS  4.  Patient  will demonstrate functional pain free cervical ROM for safety with driving.  Baseline: refer to above cervical ROM table Goal status: IN PROGRESS  5.  Patient will report </= 37% on NDI to demonstrate improved functional ability.  Baseline: 26 / 50 = 52.0 % Goal status: IN PROGRESS  6  Patient will report >/= 50% improvement in her sleep due to decreased neck pain. Baseline: Sleep greatly disturbed for up to 3-5 hrs due to neck pain per NDI Goal status: IN PROGRESS   7.  Patient will be able to resume attending church services w/o limitation due to neck pain. Baseline: Has been unable to attend church Goal status: IN PROGRESS   8. Patient will be able to resume walking for exercise w/o limitation due to neck pain. Baseline: Has been unable to walk d/t pain Goal status: IN PROGRESS   PLAN:  PT FREQUENCY: 2x/week  PT DURATION: 8 weeks  PLANNED INTERVENTIONS: Therapeutic exercises, Therapeutic activity, Neuromuscular re-education, Balance training, Patient/Family education, Self Care, Joint mobilization, Dry Needling, Electrical stimulation, Spinal mobilization, Cryotherapy, Moist heat, Taping, Ultrasound, Ionotophoresis 4mg /ml Dexamethasone, Manual therapy, and Re-evaluation  PLAN FOR NEXT SESSION: Assess response to DN; gently progress isometrics and cervical flexibility and ROM, postural correction and strengthening; review and progress HEP as able; MT +/- DN as indicated and benefit noted   Marry Guan, PT 03/31/2023, 1:07 PM

## 2023-03-31 NOTE — Telephone Encounter (Signed)
-----   Message from Quintella Reichert, MD sent at 03/30/2023  3:47 PM EDT ----- Ddimer normal when adjusted for age

## 2023-04-03 NOTE — Therapy (Signed)
OUTPATIENT PHYSICAL THERAPY TREATMENT   Patient Name: Stephanie [name] MRN: [id number] DOB:February 22, 1932, 89 y.o., female Today's Date: 04/04/2023   END OF SESSION:  PT End of Session - 04/04/23 1103     Visit Number 8    Number of Visits 20    Date for PT Re-Evaluation 04/18/23    Authorization Type Medicare & BCBS    Progress Note Due on Visit 10    PT Start Time 1100    PT Stop Time 1201    PT Time Calculation (min) 61 min    Activity Tolerance Patient tolerated treatment well;Patient limited by pain              Past Medical History:  Diagnosis Date   Allergy    Anemia    past hx    Arthropathy of cervical spine 11/27/2012   Right  Xray at [facility]  On 04/08/2016  AP, lateral, and lateral flexion and extension views of the cervical spine are submitted for evaluation.   No acute fracture identified in the cervical spine.   There is redemonstration of approximately 2 mm of C3 on C4 anterolisthesis in neutral which slightly increases in flexion relative to neutral and extension. Slight C5 on C6 retrolisthesis does not change i   Benign hypertension without congestive heart failure    Benign mole    right hcets mole, bleeds when dried with towel   Bilateral dry eyes    Bladder cancer (HCC) 10/20/2016   Cervical spondylosis    Cervicalgia    2 screws in place    Depression with anxiety 10/20/16   prior to husbands death    Dyspnea    Gross hematuria    developed after first hip replacment, led to indicental finding of bladder tumor, bleeding now resolved    H/O total shoulder replacement, left    History of kidney stones    1970's   Left hand weakness    due to reinjury of flxor tendon s/p tendon surgery march 12-2018   Lumbar stenosis    Malignant tumor of urinary bladder (HCC) 07/2016   Pre-stage I   OA (osteoarthritis)    Parkinson disease dx 2012   neurologist-  dr siddiqui at [facility]   Psoriatic arthritis [facility])     Dr Nickola Major , [place] [facility]    Rupture of flexor  tendon of hand 2020   right   Past Surgical History:  Procedure Laterality Date   BIOPSY MASS LEFT FIRST METACARPAL   06/23/2006   benign   CATARACT EXTRACTION W/ INTRAOCULAR LENS  IMPLANT, BILATERAL  1999 and 2003   CERVICAL FUSION  02/2017   c1-c2 ; reports she has 2 screws 2in long in place done at [facility] ; patient exhibits VERY LIMITED NECK ROM   COLONOSCOPY     CYSTOSCOPY W/ RETROGRADES Bilateral 09/22/2016   Procedure: CYSTOSCOPY WITH RETROGRADE PYELOGRAM;  Surgeon: Sebastian Ache, MD;  Location: [facility];  Service: Urology;  Laterality: Bilateral;   D & C HYSTEROSCOPY W/ RESECTION POLYP  07/11/2000   DILATION AND CURETTAGE OF UTERUS  1960's   EXCISION CYST AND DEBRIDEMENT RIGHT WIRST AND REMOVAL FORGEIGN BODY  01/09/2009   FLEXOR TENDON REPAIR Left 12/12/2018   Procedure: REPAIR/TRANSFER FLEXOR DIGITORUM PROFUNDUS OF LEFT SMALL FINGER;  Surgeon: Cindee Salt, MD;  Location: [facility];  Service: Orthopedics;  Laterality: Left;   LAPAROSCOPY  yrs ago   infertility    METACARPOPHALANGEAL JOINT ARTHRODESIS Right  05/25/1996   REVERSE SHOULDER ARTHROPLASTY Left 10/14/2022   Procedure: REVERSE SHOULDER ARTHROPLASTY;  Surgeon: Francena Hanly, MD;  Location: [facility];  Service: Orthopedics;  Laterality: Left;    TENDON REPAIR Left 01/15/2019   Hand   TONSILLECTOMY AND ADENOIDECTOMY  child   TOTAL HIP ARTHROPLASTY Left 07/07/2016   Procedure: LEFT TOTAL HIP ARTHROPLASTY ANTERIOR APPROACH;  Surgeon: Ollen Gross, MD;  Location: [facility];  Service: Orthopedics;  Laterality: Left;   TOTAL HIP ARTHROPLASTY Right 09/28/2017   Procedure: RIGHT TOTAL HIP ARTHROPLASTY ANTERIOR APPROACH;  Surgeon: Ollen Gross, MD;  Location: [facility];  Service: Orthopedics;  Laterality: Right;   TOTAL KNEE ARTHROPLASTY Right 07/30/2019   Procedure: TOTAL KNEE ARTHROPLASTY;  Surgeon: Ollen Gross, MD;  Location: [facility];  Service: Orthopedics;  Laterality: Right;     TRANSURETHRAL RESECTION OF BLADDER TUMOR N/A 09/22/2016   Procedure: TRANSURETHRAL RESECTION OF BLADDER TUMOR (TURBT);  Surgeon: Sebastian Ache, MD;  Location: [facility];  Service: Urology;  Laterality: N/A;   Patient Active Problem List   Diagnosis Date Noted   S/P reverse total shoulder arthroplasty, left 10/14/2022   Preoperative cardiovascular examination 10/04/2022   SOB (shortness of breath) 08/02/2022   Insomnia 08/02/2022   Hyponatremia 08/01/2022   Shoulder pain, left 09/14/2021   History of COVID-19 01/06/2021   Diarrhea 09/03/2020   Vitamin D deficiency 09/03/2020   OA (osteoarthritis) of knee 07/30/2019   Tinea corporis 06/08/2019   Psoriatic arthritis (HCC) 04/02/2019   Anemia 03/13/2018   Hot flashes 03/13/2018   S/P cervical spinal fusion 03/31/2017   Neck pain 12/12/2016   Depression with anxiety 10/17/2016   Bladder cancer (HCC) 10/17/2016   OA (osteoarthritis) of hip 07/07/2016   Spinal stenosis of lumbar region with radiculopathy 04/08/2016   Spondylolisthesis of lumbar region 04/08/2016   Spondylosis of cervical region without myelopathy or radiculopathy 04/08/2016   Preventative health care 01/18/2016   Low back pain 01/18/2016   History of chicken pox    Hyperlipidemia 12/15/2014   Allergic rhinitis 06/25/2014   Allergy to bee sting 06/10/2014   TMJ tenderness 11/16/2013   Other malaise and fatigue 05/07/2013   Arthropathy of cervical spine (HCC) 11/27/2012   Muscle spasms of neck 11/27/2012   Parkinson's disease 05/24/2012   Conjunctivochalasis 03/09/2012   Epiretinal membrane 03/09/2012   Hyperopia with astigmatism and presbyopia 03/09/2012   Pseudophakia 03/09/2012   HTN (hypertension) 08/31/2011   Right knee pain 08/31/2011   Benign hypertensive heart disease without heart failure 05/12/2011    PCP: Bradd Canary, MD   REFERRING PROVIDER: Romero Belling, MD   REFERRING DIAG: Multilevel cervical spondylosis, L neck &  trapezial myofascial pain/contracture   THERAPY DIAG:  Cervicalgia  Abnormal posture  Muscle weakness (generalized)  Other muscle spasm  Stiffness of left shoulder, not elsewhere classified  RATIONALE FOR EVALUATION AND TREATMENT: Rehabilitation  ONSET DATE: acute on chronic  NEXT MD VISIT: ~June 2024   SUBJECTIVE:  SUBJECTIVE STATEMENT: Yesterday pain was just in the neck. Today it is back in the shoulder blade also. I don't thing the DN did anything.  PAIN: Are you having pain? Yes: NPRS scale: 6/10 Pain location: L neck and scapula Pain description: constant sharp Aggravating factors: any movement of head and neck, dyskinesia from PD meds Relieving factors: sitting still in her recliner  PERTINENT HISTORY:  Chronic neck pain, L shoulder advanced OA s/p reverse TSA on 10/14/22; R TKA 07/2019, B THR 2018 & 2017, posterior C1-2 cervical fusion 2018, rupture of flexor tendon of L hand with failed surgical repair 2020, Parkinson's, psoriatic arthritis, bladder cancer, HTN    PRECAUTIONS: None  HAND DOMINANCE: Right  WEIGHT BEARING RESTRICTIONS: No  FALLS:  Has patient fallen in last 6 months? No  LIVING ENVIRONMENT: Lives with: lives alone Lives in: House/apartment Stairs: Yes: External: 4 steps; on right going up, on left going up, and can reach both Has following equipment at home: Single point cane, Walker - 2 wheeled, Shower bench, bed side commode, and Grab bars  OCCUPATION: Retired  [employer]: Independent and Leisure: volunteering at [employer], bible study, walking 1/3 mile daily - unable recently due to pain   PATIENT GOALS: "Get rid of this pain so I can go back to my normal activities."   OBJECTIVE: (objective measures completed at initial evaluation unless otherwise  dated)  DIAGNOSTIC FINDINGS:  Cervical MRI - results not available, but pt reports she was told she had severe OA  02/07/23 - Cervical spine x-ray: IMPRESSION: Multilevel degenerative change and postoperative change without acute abnormality.  PATIENT SURVEYS:  NDI 26 / 50 = 52.0 %  COGNITION: Overall cognitive status: Within functional limits for tasks assessed  SENSATION: WFL  POSTURE:  rounded shoulders, forward head, weight shift right, and head laterally shifted to R  PALPATION: Severe increased muscle tension and TTP in L>R UT, LS and cervical paraspinals as well as L periscapular muscles   CERVICAL ROM:   Active ROM AROM  eval  Flexion 46  Extension 22 *  Right lateral flexion 16 *  Left lateral flexion 10 *  Right rotation 16 *  Left rotation 18*   (Blank rows = not tested, * = pain) 02/21/23 - clicking with B rotation  UPPER EXTREMITY ROM:  Active ROM Right eval Left eval  Shoulder flexion 146 111  Shoulder extension 50 42  Shoulder abduction 155 118  Shoulder adduction    Shoulder internal rotation FIR to sacrum FIR to lateral buttock  Shoulder external rotation FER T3 FER T1  Elbow flexion    Elbow extension    Wrist flexion    Wrist extension    Wrist ulnar deviation    Wrist radial deviation    Wrist pronation    Wrist supination     (Blank rows = not tested)  UPPER EXTREMITY MMT:   MMT Right 03/03/23 Left 03/03/23  Shoulder flexion 4+ 4-  Shoulder extension 4+ 4+  Shoulder abduction 4+ 4  Shoulder adduction    Shoulder internal rotation 4+ 4  Shoulder external rotation 4 4   (Blank rows = not tested)  CERVICAL SPECIAL TESTS:  Spurling's test: Positive and Distraction test: Positive   TODAY'S TREATMENT:   04/04/23 THERAPEUTIC EXERCISE: to improve flexibility, strength and mobility.  Verbal and tactile cues throughout for technique.  UBE: L2.0 x 6 min (3' each fwd & back) - more comfortable going backwards Seated neck isometrics 10  sec hold x 5 ea -  ext, flex with fist under chin, retraction, SB and rot - pt noting the best relief with R SB Prone shoulder IR and ER isometrics 5 x 5 sec with patients shoulder in 90 deg abd and elbow at 90 deg - hand toward floor, Marked pain afterwards when tried to lift head.  MANUAL THERAPY: To promote normalized muscle tension, improved flexibility, improved joint mobility, increased ROM, reduced pain, and improved posture TPR, STM and IASTM to L neck and parascapular muscles - began with thera-gun and this was only tolerated mildly at lowest level. Then used EDGE tool.   MODALITIES: TENS unit to L lateral neck and periscapular muscles, 80-150 Hz, intensity to patient tolerance x 10 min with pt appying electrodes herself to see feasibility of ordering home unit. TEN 7000 - difficult for patient to use dials.  Moist heat to L neck and upper shoulder during estim    03/31/23 THERAPEUTIC EXERCISE: to improve flexibility, strength and mobility.  Verbal and tactile cues throughout for technique.  UBE: L2.0 x 6 min (3' each fwd & back) - more comfortable going backwards Seated neck isometrics 10 sec hold x 5 ea - ext, retraction, SB and rot - pt noting the best relief with R SB  MANUAL THERAPY: To promote normalized muscle tension, improved flexibility, improved joint mobility, increased ROM, reduced pain, and improved posture . Skilled palpation and monitoring of soft tissue during DN Trigger Point Dry-Needling  Treatment instructions: Expect mild to moderate muscle soreness. S/S of pneumothorax if dry needled over a lung field, and to seek immediate medical attention should they occur. Patient verbalized understanding of these instructions and education. Patient Consent Given: Yes Education handout provided: Previously provided Muscles treated: L UT, LS and scalenes Electrical stimulation performed: No Parameters: N/A Treatment response/outcome: Twitch Response Elicited and Palpable  Increase in Muscle Length STM/DTM, manual TPR and pin & stretch to muscles addressed with DN as well as L cervicothoracic paraspinals and periscapular muscles L subscapular release and scapular mobs in R S/L  MODALITIES: IFC to L lateral neck and periscapular muscles, 80-150 Hz, intensity to patient tolerance x 12 min  Moist heat to L neck and upper shoulder during estim   03/21/23 THERAPEUTIC EXERCISE: to improve flexibility, strength and mobility.  Verbal and tactile cues throughout for technique.  UBE: L1.0 x 6 min (3' each fwd & back) - more comfortable going backwards Seated neck isometrics 10 sec hold x 5 ea - ext. SB, rot and flexion Seated chin tuck into ball x 10 Seated left neck rotation x 10 (pt looking to a target)  Manual Therapy: to decrease muscle spasm and pain and improve mobility TPR  to left LS and cervical muscles as tolerated  Positioning: towel around neck, left SDLY. Tolerated in neck but painful in hip.Asked pt to try at home on bed.  Modalities: IFC estim to left neck/UT/LS x 10 min in sitting with MHP to neck   03/17/23 THERAPEUTIC EXERCISE: to improve flexibility, strength and mobility.  Verbal and tactile cues throughout for technique.  UBE: L1.0 x 6 min (3' each fwd & back) - more comfortable going backwards Seated left lumbar stretch - painful in left neck S/L over yoga mat at just above hip level - minimal stretch of lumbar Passive stretch in above position was effective.  Supine active left rotation 5 sec hold x 5, also tried with TPR to SCM - too painful  MANUAL THERAPY: To promote normalized muscle tension, improved flexibility, improved joint mobility,  increased ROM, and reduced pain. IASTM with EDGE tool to B UT, LS, cervical multifidi; L scalenes, rhomboids, SCM, left lumbar/QL. TPR to SCM in supine   PATIENT EDUCATION:  Education details: HEP review and HEP modification - deferred SCM stretch   Person educated: Patient Education method:  Explanation, Demonstration, and Handouts Education comprehension: verbalized understanding  HOME EXERCISE PROGRAM: Access Code: [email or web address] URL: [email or web address] Date: 03/21/2023 Prepared by: Raynelle Fanning  Exercises - Seated Cervical Retraction  - 2-3 x daily - 7 x weekly - 2 sets - 10 reps - 3-5 sec hold - Shoulder Rolls in Sitting  - 2-3 x daily - 7 x weekly - 2 sets - 10 reps - 3 sec hold - Seated Scapular Retraction  - 2-3 x daily - 7 x weekly - 2 sets - 10 reps - 5 sec hold - Sternocleidomastoid Stretch  - 2-3 x daily - 7 x weekly - 3 reps - 30 sec hold - Standing Bilateral Low Shoulder Row with Anchored Resistance  - 1 x daily - 3-4 x weekly - 2 sets - 10 reps - 5 sec hold - Scapular Retraction with Resistance Advanced  - 1 x daily - 3-4 x weekly - 2 sets - 10 reps - 5 sec hold - Seated Shoulder Flexion Full Range (Mirrored)  - 1 x daily - 3-4 x weekly - 2 sets - 10 reps - 3 sec hold - Seated Single Arm Shoulder Abduction - Thumb Up  - 1 x daily - 3-4 x weekly - 2 sets - 10 reps - 3 sec hold - Shoulder Internal Rotation with Resistance (Mirrored)  - 1 x daily - 3 x weekly - 1-2 sets - 10 reps - 3 sec hold - Shoulder External Rotation with Anchored Resistance  - 1 x daily - 3 x weekly - 1-2 sets - 10 reps - 3 sec hold - Standing Isometric Cervical Sidebending with Manual Resistance  - 1 x daily - 7 x weekly - 1 sets - 5 reps - 10 sec hold hold - Standing Isometric Cervical Flexion with Manual Resistance  - 1 x daily - 7 x weekly - 1 sets - 5 reps - 10 sec hold - Standing Isometric Cervical Extension with Manual Resistance  - 1 x daily - 7 x weekly - 1 sets - 5 reps - 10 sec hold - Seated Isometric Cervical Rotation  - 1 x daily - 7 x weekly - 1 sets - 5 reps - 10 sec hold   ASSESSMENT:  CLINICAL IMPRESSION: Larica reports ongoing pain in left neck and scapular muscles today. She does not think DN is helping. She reports relief with TPR, so manual therapy focused on this  with a trial of IASTM with thera- gun but not tolerated well. She had marked pain post MT today after shoulder isometrics. Pain was in the same lateral point of proximal neck and occurred when trying to move from prone to sitting. She was able to tolerate the transfer with direct pressure to the painful area by therapist during transition. Trial of TENS unit done as pt may benefit from use at home. She was able to apply the electrodes independently, however the TENS 7000's dials were difficult for the pt to manage. Info supplied on another unit with more accessible buttons for her to consider. Abi is making little to no progress with pain control. She gets some relief intermittently, but nothing is lasting. We discussed option of taping, but deferred this today.  OBJECTIVE  IMPAIRMENTS: decreased activity tolerance, decreased endurance, decreased knowledge of condition, decreased mobility, difficulty walking, decreased ROM, decreased strength, hypomobility, increased fascial restrictions, impaired perceived functional ability, increased muscle spasms, impaired flexibility, impaired UE functional use, improper body mechanics, postural dysfunction, and pain.   ACTIVITY LIMITATIONS: carrying, lifting, standing, sleeping, transfers, bed mobility, bathing, toileting, dressing, reach over head, and hygiene/grooming  PARTICIPATION LIMITATIONS: meal prep, cleaning, laundry, driving, shopping, community activity, occupation, yard work, and church  PERSONAL FACTORS: Age, Fitness, Past/current experiences, Time since onset of injury/illness/exacerbation, and 3+ comorbidities: Chronic neck pain, L shoulder advanced OA s/p reverse TSA on 10/14/22; R TKA 07/2019, B THR 2018 & 2017, posterior C1-2 cervical fusion 2018, rupture of flexor tendon of L hand with failed surgical repair 2020, Parkinson's, psoriatic arthritis, bladder cancer, HTN    are also affecting patient's functional outcome.   REHAB POTENTIAL:  Good  CLINICAL DECISION MAKING: Evolving/moderate complexity  EVALUATION COMPLEXITY: Moderate   GOALS: Goals reviewed with patient? Yes  SHORT TERM GOALS: Target date: 03/21/2023  Patient will be independent with initial HEP to improve outcomes and carryover.  Baseline: Established 03/03/23 Goal status: MET  03/31/23  2.  Patient will report 25% improvement in neck pain to improve QOL.   Baseline: 8-10/10 Goal status: IN PROGRESS  03/21/23 - no change in pain  LONG TERM GOALS: Target date: 04/18/2023  Patient will be independent with ongoing/advanced HEP for self-management at home.  Baseline:  Goal status: IN PROGRESS  2.  Patient will demonstrate improved posture to decrease muscle imbalance. Baseline: rounded shoulders, forward head, weight shift right, and head laterally shifted to R Goal status: IN PROGRESS  03/31/23 - still with significant forward head and flexed cervical and upper thoracic posture  3.  Patient will report 50-75% improvement in neck pain to improve QOL.  Baseline: 8-10/10 Goal status: IN PROGRESS  4.  Patient will demonstrate functional pain free cervical ROM for safety with driving.  Baseline: refer to above cervical ROM table Goal status: IN PROGRESS  5.  Patient will report </= 37% on NDI to demonstrate improved functional ability.  Baseline: 26 / 50 = 52.0 % Goal status: IN PROGRESS  6  Patient will report >/= 50% improvement in her sleep due to decreased neck pain. Baseline: Sleep greatly disturbed for up to 3-5 hrs due to neck pain per NDI Goal status: IN PROGRESS   7.  Patient will be able to resume attending church services w/o limitation due to neck pain. Baseline: Has been unable to attend church Goal status: IN PROGRESS   8. Patient will be able to resume walking for exercise w/o limitation due to neck pain. Baseline: Has been unable to walk d/t pain Goal status: IN PROGRESS   PLAN:  PT FREQUENCY: 2x/week  PT DURATION: 8  weeks  PLANNED INTERVENTIONS: Therapeutic exercises, Therapeutic activity, Neuromuscular re-education, Balance training, Patient/Family education, Self Care, Joint mobilization, Dry Needling, Electrical stimulation, Spinal mobilization, Cryotherapy, Moist heat, Taping, Ultrasound, Ionotophoresis 4mg /ml Dexamethasone, Manual therapy, and Re-evaluation  PLAN FOR NEXT SESSION: Assess response to DN; gently progress isometrics and cervical flexibility and ROM, postural correction and strengthening; review and progress HEP as able; MT +/- DN as indicated and benefit noted   Jacey Pelc, PT 04/04/2023, 12:27 PM

## 2023-04-04 ENCOUNTER — Ambulatory Visit: Payer: Medicare Other | Admitting: Physical Therapy

## 2023-04-04 ENCOUNTER — Other Ambulatory Visit: Payer: Self-pay

## 2023-04-04 ENCOUNTER — Encounter: Payer: Self-pay | Admitting: Physical Therapy

## 2023-04-04 DIAGNOSIS — M542 Cervicalgia: Secondary | ICD-10-CM

## 2023-04-04 DIAGNOSIS — M25612 Stiffness of left shoulder, not elsewhere classified: Secondary | ICD-10-CM

## 2023-04-04 DIAGNOSIS — M6281 Muscle weakness (generalized): Secondary | ICD-10-CM | POA: Diagnosis not present

## 2023-04-04 DIAGNOSIS — M62838 Other muscle spasm: Secondary | ICD-10-CM | POA: Diagnosis not present

## 2023-04-04 DIAGNOSIS — R293 Abnormal posture: Secondary | ICD-10-CM | POA: Diagnosis not present

## 2023-04-07 ENCOUNTER — Ambulatory Visit: Payer: Medicare Other | Admitting: Physical Therapy

## 2023-04-07 DIAGNOSIS — R293 Abnormal posture: Secondary | ICD-10-CM | POA: Diagnosis not present

## 2023-04-07 DIAGNOSIS — M542 Cervicalgia: Secondary | ICD-10-CM

## 2023-04-07 DIAGNOSIS — M25612 Stiffness of left shoulder, not elsewhere classified: Secondary | ICD-10-CM | POA: Diagnosis not present

## 2023-04-07 DIAGNOSIS — M62838 Other muscle spasm: Secondary | ICD-10-CM | POA: Diagnosis not present

## 2023-04-07 DIAGNOSIS — M6281 Muscle weakness (generalized): Secondary | ICD-10-CM

## 2023-04-07 NOTE — Therapy (Addendum)
OUTPATIENT PHYSICAL THERAPY TREATMENT   Patient Name: Stephanie [name] MRN: [id number] DOB:08/13/1932, 89 y.o., female Today's Date: 04/07/2023   END OF SESSION:  PT End of Session - 04/07/23 1109     Visit Number 9    Number of Visits 20    Date for PT Re-Evaluation 04/18/23    Authorization Type Medicare & BCBS    Progress Note Due on Visit 10    PT Start Time 1109    PT Stop Time 1154    PT Time Calculation (min) 45 min    Activity Tolerance Patient tolerated treatment well;Patient limited by pain    Behavior During Therapy  [facility] for tasks assessed/performed              Past Medical History:  Diagnosis Date   Allergy    Anemia    past hx    Arthropathy of cervical spine 11/27/2012   Right  Xray at [facility]  On 04/08/2016  AP, lateral, and lateral flexion and extension views of the cervical spine are submitted for evaluation.   No acute fracture identified in the cervical spine.   There is redemonstration of approximately 2 mm of C3 on C4 anterolisthesis in neutral which slightly increases in flexion relative to neutral and extension. Slight C5 on C6 retrolisthesis does not change i   Benign hypertension without congestive heart failure    Benign mole    right hcets mole, bleeds when dried with towel   Bilateral dry eyes    Bladder cancer (HCC) 11/15/2016   Cervical spondylosis    Cervicalgia    2 screws in place    Depression with anxiety 2016/11/15   prior to husbands death    Dyspnea    Gross hematuria    developed after first hip replacment, led to indicental finding of bladder tumor, bleeding now resolved    H/O total shoulder replacement, left    History of kidney stones    1970's   Left hand weakness    due to reinjury of flxor tendon s/p tendon surgery march 12-2018   Lumbar stenosis    Malignant tumor of urinary bladder (HCC) 07/2016   Pre-stage I   OA (osteoarthritis)    Parkinson disease dx 2012   neurologist-  dr siddiqui at [facility]   Psoriatic  arthritis [facility])     Dr Nickola Major , [place] [facility]    Rupture of flexor tendon of hand 2020   right   Past Surgical History:  Procedure Laterality Date   BIOPSY MASS LEFT FIRST METACARPAL   06/23/2006   benign   CATARACT EXTRACTION W/ INTRAOCULAR LENS  IMPLANT, BILATERAL  1999 and 2003   CERVICAL FUSION  02/2017   c1-c2 ; reports she has 2 screws 2in long in place done at [facility] ; patient exhibits VERY LIMITED NECK ROM   COLONOSCOPY     CYSTOSCOPY W/ RETROGRADES Bilateral 09/22/2016   Procedure: CYSTOSCOPY WITH RETROGRADE PYELOGRAM;  Surgeon: Sebastian Ache, MD;  Location: [facility];  Service: Urology;  Laterality: Bilateral;   D & C HYSTEROSCOPY W/ RESECTION POLYP  07/11/2000   DILATION AND CURETTAGE OF UTERUS  1960's   EXCISION CYST AND DEBRIDEMENT RIGHT WIRST AND REMOVAL FORGEIGN BODY  01/09/2009   FLEXOR TENDON REPAIR Left 12/12/2018   Procedure: REPAIR/TRANSFER FLEXOR DIGITORUM PROFUNDUS OF LEFT SMALL FINGER;  Surgeon: Cindee Salt, MD;  Location: [facility];  Service: Orthopedics;  Laterality: Left;   LAPAROSCOPY  yrs  ago   infertility    METACARPOPHALANGEAL JOINT ARTHRODESIS Right 05/25/1996   REVERSE SHOULDER ARTHROPLASTY Left 10/14/2022   Procedure: REVERSE SHOULDER ARTHROPLASTY;  Surgeon: Francena Hanly, MD;  Location: [facility];  Service: Orthopedics;  Laterality: Left;    TENDON REPAIR Left 01/15/2019   Hand   TONSILLECTOMY AND ADENOIDECTOMY  child   TOTAL HIP ARTHROPLASTY Left 07/07/2016   Procedure: LEFT TOTAL HIP ARTHROPLASTY ANTERIOR APPROACH;  Surgeon: Ollen Gross, MD;  Location: [facility];  Service: Orthopedics;  Laterality: Left;   TOTAL HIP ARTHROPLASTY Right 09/28/2017   Procedure: RIGHT TOTAL HIP ARTHROPLASTY ANTERIOR APPROACH;  Surgeon: Ollen Gross, MD;  Location: [facility];  Service: Orthopedics;  Laterality: Right;   TOTAL KNEE ARTHROPLASTY Right 07/30/2019   Procedure: TOTAL KNEE ARTHROPLASTY;  Surgeon: Ollen Gross, MD;   Location: [facility];  Service: Orthopedics;  Laterality: Right;    TRANSURETHRAL RESECTION OF BLADDER TUMOR N/A 09/22/2016   Procedure: TRANSURETHRAL RESECTION OF BLADDER TUMOR (TURBT);  Surgeon: Sebastian Ache, MD;  Location: [facility];  Service: Urology;  Laterality: N/A;   Patient Active Problem List   Diagnosis Date Noted   S/P reverse total shoulder arthroplasty, left 10/14/2022   Preoperative cardiovascular examination 10/04/2022   SOB (shortness of breath) 08/02/2022   Insomnia 08/02/2022   Hyponatremia 08/01/2022   Shoulder pain, left 09/14/2021   History of COVID-19 01/06/2021   Diarrhea 09/03/2020   Vitamin D deficiency 09/03/2020   OA (osteoarthritis) of knee 07/30/2019   Tinea corporis 06/08/2019   Psoriatic arthritis (HCC) 04/02/2019   Anemia 03/13/2018   Hot flashes 03/13/2018   S/P cervical spinal fusion 03/31/2017   Neck pain 12/12/2016   Depression with anxiety 10/17/2016   Bladder cancer (HCC) 10/17/2016   OA (osteoarthritis) of hip 07/07/2016   Spinal stenosis of lumbar region with radiculopathy 04/08/2016   Spondylolisthesis of lumbar region 04/08/2016   Spondylosis of cervical region without myelopathy or radiculopathy 04/08/2016   Preventative health care 01/18/2016   Low back pain 01/18/2016   History of chicken pox    Hyperlipidemia 12/15/2014   Allergic rhinitis 06/25/2014   Allergy to bee sting 06/10/2014   TMJ tenderness 11/16/2013   Other malaise and fatigue 05/07/2013   Arthropathy of cervical spine (HCC) 11/27/2012   Muscle spasms of neck 11/27/2012   Parkinson's disease 05/24/2012   Conjunctivochalasis 03/09/2012   Epiretinal membrane 03/09/2012   Hyperopia with astigmatism and presbyopia 03/09/2012   Pseudophakia 03/09/2012   HTN (hypertension) 08/31/2011   Right knee pain 08/31/2011   Benign hypertensive heart disease without heart failure 05/12/2011    PCP: Bradd Canary, MD   REFERRING PROVIDER: Romero Belling,  MD   REFERRING DIAG: Multilevel cervical spondylosis, L neck & trapezial myofascial pain/contracture   THERAPY DIAG:  Cervicalgia  Abnormal posture  Muscle weakness (generalized)  Other muscle spasm  RATIONALE FOR EVALUATION AND TREATMENT: Rehabilitation  ONSET DATE: acute on chronic  NEXT MD VISIT: ~June 2024   SUBJECTIVE:  SUBJECTIVE STATEMENT: Ahana is expressing frustration that the pain does not seem to be improving as much with this episode of PT. Pain up to 10/10 in the middle of the night.  PAIN: Are you having pain? Yes: NPRS scale:  6/10 Pain location: L neck and scapula Pain description: constant sharp Aggravating factors: any movement of head and neck, dyskinesia from PD meds Relieving factors: sitting still in her recliner  PERTINENT HISTORY:  Chronic neck pain, L shoulder advanced OA s/p reverse TSA on 10/14/22; R TKA 07/2019, B THR 2018 & 2017, posterior C1-2 cervical fusion 2018, rupture of flexor tendon of L hand with failed surgical repair 2020, Parkinson's, psoriatic arthritis, bladder cancer, HTN    PRECAUTIONS: None  HAND DOMINANCE: Right  WEIGHT BEARING RESTRICTIONS: No  FALLS:  Has patient fallen in last 6 months? No  LIVING ENVIRONMENT: Lives with: lives alone Lives in: House/apartment Stairs: Yes: External: 4 steps; on right going up, on left going up, and can reach both Has following equipment at home: Single point cane, Walker - 2 wheeled, Shower bench, bed side commode, and Grab bars  OCCUPATION: Retired  [employer]: Independent and Leisure: volunteering at [employer], bible study, walking 1/3 mile daily - unable recently due to pain   PATIENT GOALS: "Get rid of this pain so I can go back to my normal activities."   OBJECTIVE: (objective measures  completed at initial evaluation unless otherwise dated)  DIAGNOSTIC FINDINGS:  Cervical MRI - results not available, but pt reports she was told she had severe OA  02/07/23 - Cervical spine x-ray: IMPRESSION: Multilevel degenerative change and postoperative change without acute abnormality.  PATIENT SURVEYS:  NDI 26 / 50 = 52.0 %  COGNITION: Overall cognitive status: Within functional limits for tasks assessed  SENSATION: WFL  POSTURE:  rounded shoulders, forward head, weight shift right, and head laterally shifted to R  PALPATION: Severe increased muscle tension and TTP in L>R UT, LS and cervical paraspinals as well as L periscapular muscles   CERVICAL ROM:   Active ROM AROM  eval  Flexion 46  Extension 22 *  Right lateral flexion 16 *  Left lateral flexion 10 *  Right rotation 16 *  Left rotation 18*   (Blank rows = not tested, * = pain) 02/21/23 - clicking with B rotation  UPPER EXTREMITY ROM:  Active ROM Right  eval Left    eval  Shoulder flexion 146 111  Shoulder extension 50 42  Shoulder abduction 155 118  Shoulder adduction    Shoulder internal rotation FIR to sacrum FIR to lateral buttock  Shoulder external rotation FER T3 FER T1  Elbow flexion    Elbow extension    Wrist flexion    Wrist extension    Wrist ulnar deviation    Wrist radial deviation    Wrist pronation    Wrist supination     (Blank rows = not tested)  UPPER EXTREMITY MMT:   MMT Right 03/03/23 Left 03/03/23  Shoulder flexion 4+ 4-  Shoulder extension 4+ 4+  Shoulder abduction 4+ 4  Shoulder adduction    Shoulder internal rotation 4+ 4  Shoulder external rotation 4 4   (Blank rows = not tested)  CERVICAL SPECIAL TESTS:  Spurling's test: Positive and Distraction test: Positive   TODAY'S TREATMENT:   04/07/23 THERAPEUTIC EXERCISE: to improve flexibility, strength and mobility.  Verbal and tactile cues throughout for technique.  UBE: L1.0 x 4 min (all backwards) - more  comfortable going backwards  but still notes increased pain Seated PT resisted neck isometrics 10 sec hold x 5 each - ext, retraction, SB and rot - pt noting the best relief with R SB  MANUAL THERAPY: To promote normalized muscle tension, improved flexibility, improved joint mobility, increased ROM, and reduced pain. STM/DTM, IASTM with edge tool, manual TPR and pin & stretch to L>R cervicothoracic paraspinals and periscapular muscles, esp L UT & LS Cervical NAGs in sitting for B cervical rotation - improving ROM but continued audible clicking more evident with left rotation   04/04/23 THERAPEUTIC EXERCISE: to improve flexibility, strength and mobility.  Verbal and tactile cues throughout for technique.  UBE: L2.0 x 6 min (3' each fwd & back) - more comfortable going backwards Seated neck isometrics 10 sec hold x 5 ea - ext, flex with fist under chin, retraction, SB and rot - pt noting the best relief with R SB Prone shoulder IR and ER isometrics 5 x 5 sec with patients shoulder in 90 deg abd and elbow at 90 deg - hand toward floor, Marked pain afterwards when tried to lift head.  MANUAL THERAPY: To promote normalized muscle tension, improved flexibility, improved joint mobility, increased ROM, reduced pain, and improved posture TPR, STM and IASTM to L neck and parascapular muscles - began with thera-gun and this was only tolerated mildly at lowest level. Then used EDGE tool.   MODALITIES: TENS unit to L lateral neck and periscapular muscles, 80-150 Hz, intensity to patient tolerance x 10 min with pt appying electrodes herself to see feasibility of ordering home unit. TEN 7000 - difficult for patient to use dials.  Moist heat to L neck and upper shoulder during estim   03/31/23 THERAPEUTIC EXERCISE: to improve flexibility, strength and mobility.  Verbal and tactile cues throughout for technique.  UBE: L2.0 x 6 min (3' each fwd & back) - more comfortable going backwards Seated neck isometrics 10  sec hold x 5 ea - ext, retraction, SB and rot - pt noting the best relief with R SB  MANUAL THERAPY: To promote normalized muscle tension, improved flexibility, improved joint mobility, increased ROM, reduced pain, and improved posture . Skilled palpation and monitoring of soft tissue during DN Trigger Point Dry-Needling  Treatment instructions: Expect mild to moderate muscle soreness. S/S of pneumothorax if dry needled over a lung field, and to seek immediate medical attention should they occur. Patient verbalized understanding of these instructions and education. Patient Consent Given: Yes Education handout provided: Previously provided Muscles treated: L UT, LS and scalenes Electrical stimulation performed: No Parameters: N/A Treatment response/outcome: Twitch Response Elicited and Palpable Increase in Muscle Length STM/DTM, manual TPR and pin & stretch to muscles addressed with DN as well as L cervicothoracic paraspinals and periscapular muscles L subscapular release and scapular mobs in R S/L  MODALITIES: IFC to L lateral neck and periscapular muscles, 80-150 Hz, intensity to patient tolerance x 12 min  Moist heat to L neck and upper shoulder during estim   PATIENT EDUCATION:  Education details: HEP review and HEP modification - deferred SCM stretch   Person educated: Patient Education method: Explanation, Demonstration, and Handouts Education comprehension: verbalized understanding  HOME EXERCISE PROGRAM: Access Code: [email or web address] URL: [email or web address] Date: 03/21/2023 Prepared by: Raynelle Fanning  Exercises - Seated Cervical Retraction  - 2-3 x daily - 7 x weekly - 2 sets - 10 reps - 3-5 sec hold - Shoulder Rolls in Sitting  - 2-3 x daily - 7 x weekly - 2 sets -  10 reps - 3 sec hold - Seated Scapular Retraction  - 2-3 x daily - 7 x weekly - 2 sets - 10 reps - 5 sec hold - Sternocleidomastoid Stretch  - 2-3 x daily - 7 x weekly - 3 reps - 30 sec hold - Standing  Bilateral Low Shoulder Row with Anchored Resistance  - 1 x daily - 3-4 x weekly - 2 sets - 10 reps - 5 sec hold - Scapular Retraction with Resistance Advanced  - 1 x daily - 3-4 x weekly - 2 sets - 10 reps - 5 sec hold - Seated Shoulder Flexion Full Range (Mirrored)  - 1 x daily - 3-4 x weekly - 2 sets - 10 reps - 3 sec hold - Seated Single Arm Shoulder Abduction - Thumb Up  - 1 x daily - 3-4 x weekly - 2 sets - 10 reps - 3 sec hold - Shoulder Internal Rotation with Resistance (Mirrored)  - 1 x daily - 3 x weekly - 1-2 sets - 10 reps - 3 sec hold - Shoulder External Rotation with Anchored Resistance  - 1 x daily - 3 x weekly - 1-2 sets - 10 reps - 3 sec hold - Standing Isometric Cervical Sidebending with Manual Resistance  - 1 x daily - 7 x weekly - 1 sets - 5 reps - 10 sec hold hold - Standing Isometric Cervical Flexion with Manual Resistance  - 1 x daily - 7 x weekly - 1 sets - 5 reps - 10 sec hold - Standing Isometric Cervical Extension with Manual Resistance  - 1 x daily - 7 x weekly - 1 sets - 5 reps - 10 sec hold - Seated Isometric Cervical Rotation  - 1 x daily - 7 x weekly - 1 sets - 5 reps - 10 sec hold   ASSESSMENT:  CLINICAL IMPRESSION: Lissy was expressing frustration due to limited lasting relief from pain and continued sleep disturbance due to pain.  She has noted less benefit from DN and e-stim in recent visits, but still feels like MT has been helpful.  Therapeutic interventions focusing on STM/DTM and IASTM as well as MWM utilizing cervical NAGs.  Patient able to demonstrate increased cervical rotation ROM, however audible clicking still present decreased pain when turning to the left.  Given ongoing muscle restrictions and pain as well as limited ROM anticipate she will benefit from recert, which can be completed as part of 10th visit progress note on next visit.  OBJECTIVE IMPAIRMENTS: decreased activity tolerance, decreased endurance, decreased knowledge of condition, decreased  mobility, difficulty walking, decreased ROM, decreased strength, hypomobility, increased fascial restrictions, impaired perceived functional ability, increased muscle spasms, impaired flexibility, impaired UE functional use, improper body mechanics, postural dysfunction, and pain.   ACTIVITY LIMITATIONS: carrying, lifting, standing, sleeping, transfers, bed mobility, bathing, toileting, dressing, reach over head, and hygiene/grooming  PARTICIPATION LIMITATIONS: meal prep, cleaning, laundry, driving, shopping, community activity, occupation, yard work, and church  PERSONAL FACTORS: Age, Fitness, Past/current experiences, Time since onset of injury/illness/exacerbation, and 3+ comorbidities: Chronic neck pain, L shoulder advanced OA s/p reverse TSA on 10/14/22; R TKA 07/2019, B THR 2018 & 2017, posterior C1-2 cervical fusion 2018, rupture of flexor tendon of L hand with failed surgical repair 2020, Parkinson's, psoriatic arthritis, bladder cancer, HTN    are also affecting patient's functional outcome.   REHAB POTENTIAL: Good  CLINICAL DECISION MAKING: Evolving/moderate complexity  EVALUATION COMPLEXITY: Moderate   GOALS: Goals reviewed with patient? Yes  SHORT TERM  GOALS: Target date: 03/21/2023  Patient will be independent with initial HEP to improve outcomes and carryover.  Baseline: Established 03/03/23 Goal status: MET  03/31/23  2.  Patient will report 25% improvement in neck pain to improve QOL.   Baseline: 8-10/10 Goal status: IN PROGRESS  03/21/23 - no change in pain  LONG TERM GOALS: Target date: 04/18/2023  Patient will be independent with ongoing/advanced HEP for self-management at home.  Baseline:  Goal status: IN PROGRESS  2.  Patient will demonstrate improved posture to decrease muscle imbalance. Baseline: rounded shoulders, forward head, weight shift right, and head laterally shifted to R Goal status: IN PROGRESS  03/31/23 - still with significant forward head and flexed  cervical and upper thoracic posture  3.  Patient will report 50-75% improvement in neck pain to improve QOL.  Baseline: 8-10/10 Goal status: IN PROGRESS  4.  Patient will demonstrate functional pain free cervical ROM for safety with driving.  Baseline: refer to above cervical ROM table Goal status: IN PROGRESS  5.  Patient will report </= 37% on NDI to demonstrate improved functional ability.  Baseline: 26 / 50 = 52.0 % Goal status: IN PROGRESS  6  Patient will report >/= 50% improvement in her sleep due to decreased neck pain. Baseline: Sleep greatly disturbed for up to 3-5 hrs due to neck pain per NDI Goal status: IN PROGRESS   7.  Patient will be able to resume attending church services w/o limitation due to neck pain. Baseline: Has been unable to attend church Goal status: IN PROGRESS   8. Patient will be able to resume walking for exercise w/o limitation due to neck pain. Baseline: Has been unable to walk d/t pain Goal status: IN PROGRESS   PLAN:  PT FREQUENCY: 2x/week  PT DURATION: 8 weeks  PLANNED INTERVENTIONS: Therapeutic exercises, Therapeutic activity, Neuromuscular re-education, Balance training, Patient/Family education, Self Care, Joint mobilization, Dry Needling, Electrical stimulation, Spinal mobilization, Cryotherapy, Moist heat, Taping, Ultrasound, Ionotophoresis 4mg /ml Dexamethasone, Manual therapy, and Re-evaluation  PLAN FOR NEXT SESSION: 10th visit PN +/- recert; gently progress isometrics and cervical flexibility and ROM, postural correction and strengthening; review and progress HEP as able; MT +/- DN as indicated and benefit noted   Marry Guan, PT 04/07/2023, 12:14 PM

## 2023-04-12 ENCOUNTER — Ambulatory Visit: Payer: Medicare Other | Admitting: Physical Therapy

## 2023-04-12 ENCOUNTER — Encounter: Payer: Self-pay | Admitting: Physical Therapy

## 2023-04-12 DIAGNOSIS — M542 Cervicalgia: Secondary | ICD-10-CM

## 2023-04-12 DIAGNOSIS — R293 Abnormal posture: Secondary | ICD-10-CM

## 2023-04-12 DIAGNOSIS — M6281 Muscle weakness (generalized): Secondary | ICD-10-CM

## 2023-04-12 DIAGNOSIS — M62838 Other muscle spasm: Secondary | ICD-10-CM

## 2023-04-12 DIAGNOSIS — M25612 Stiffness of left shoulder, not elsewhere classified: Secondary | ICD-10-CM | POA: Diagnosis not present

## 2023-04-12 NOTE — Therapy (Addendum)
OUTPATIENT PHYSICAL THERAPY TREATMENT / RE-CERTIFICATION  Progress Note  Reporting Period 02/21/2023 to 04/12/2023  See note below for Objective Data and Assessment of Progress/Goals.     Patient Name: Stephanie [name] MRN: [id number] DOB:1932/03/26, 89 y.o., female Today's Date: 04/12/2023   END OF SESSION:  PT End of Session - 04/12/23 1058     Visit Number 10    Number of Visits 20    Date for PT Re-Evaluation 05/24/23    Authorization Type Medicare & BCBS    Progress Note Due on Visit 20    PT Start Time 1058    PT Stop Time 1156    PT Time Calculation (min) 58 min    Activity Tolerance Patient tolerated treatment well;Patient limited by pain    Behavior During Therapy  [facility] for tasks assessed/performed              Past Medical History:  Diagnosis Date   Allergy    Anemia    past hx    Arthropathy of cervical spine 11/27/2012   Right  Xray at [facility]  On 04/08/2016  AP, lateral, and lateral flexion and extension views of the cervical spine are submitted for evaluation.   No acute fracture identified in the cervical spine.   There is redemonstration of approximately 2 mm of C3 on C4 anterolisthesis in neutral which slightly increases in flexion relative to neutral and extension. Slight C5 on C6 retrolisthesis does not change i   Benign hypertension without congestive heart failure    Benign mole    right hcets mole, bleeds when dried with towel   Bilateral dry eyes    Bladder cancer (HCC) November 14, 2016   Cervical spondylosis    Cervicalgia    2 screws in place    Depression with anxiety 2016/11/14   prior to husbands death    Dyspnea    Gross hematuria    developed after first hip replacment, led to indicental finding of bladder tumor, bleeding now resolved    H/O total shoulder replacement, left    History of kidney stones    1970's   Left hand weakness    due to reinjury of flxor tendon s/p tendon surgery march 12-2018   Lumbar stenosis    Malignant tumor of  urinary bladder (HCC) 07/2016   Pre-stage I   OA (osteoarthritis)    Parkinson disease dx 2012   neurologist-  dr siddiqui at [facility]   Psoriatic arthritis [facility])     Dr Nickola Major , [place] [facility]    Rupture of flexor tendon of hand 2020   right   Past Surgical History:  Procedure Laterality Date   BIOPSY MASS LEFT FIRST METACARPAL   06/23/2006   benign   CATARACT EXTRACTION W/ INTRAOCULAR LENS  IMPLANT, BILATERAL  1999 and 2003   CERVICAL FUSION  02/2017   c1-c2 ; reports she has 2 screws 2in long in place done at [facility] ; patient exhibits VERY LIMITED NECK ROM   COLONOSCOPY     CYSTOSCOPY W/ RETROGRADES Bilateral 09/22/2016   Procedure: CYSTOSCOPY WITH RETROGRADE PYELOGRAM;  Surgeon: Sebastian Ache, MD;  Location: [facility];  Service: Urology;  Laterality: Bilateral;   D & C HYSTEROSCOPY W/ RESECTION POLYP  07/11/2000   DILATION AND CURETTAGE OF UTERUS  1960's   EXCISION CYST AND DEBRIDEMENT RIGHT WIRST AND REMOVAL FORGEIGN BODY  01/09/2009   FLEXOR TENDON REPAIR Left 12/12/2018   Procedure: REPAIR/TRANSFER FLEXOR DIGITORUM PROFUNDUS OF LEFT  SMALL FINGER;  Surgeon: Cindee Salt, MD;  Location: [facility];  Service: Orthopedics;  Laterality: Left;   LAPAROSCOPY  yrs ago   infertility    METACARPOPHALANGEAL JOINT ARTHRODESIS Right 05/25/1996   REVERSE SHOULDER ARTHROPLASTY Left 10/14/2022   Procedure: REVERSE SHOULDER ARTHROPLASTY;  Surgeon: Francena Hanly, MD;  Location: [facility];  Service: Orthopedics;  Laterality: Left;    TENDON REPAIR Left 01/15/2019   Hand   TONSILLECTOMY AND ADENOIDECTOMY  child   TOTAL HIP ARTHROPLASTY Left 07/07/2016   Procedure: LEFT TOTAL HIP ARTHROPLASTY ANTERIOR APPROACH;  Surgeon: Ollen Gross, MD;  Location: [facility];  Service: Orthopedics;  Laterality: Left;   TOTAL HIP ARTHROPLASTY Right 09/28/2017   Procedure: RIGHT TOTAL HIP ARTHROPLASTY ANTERIOR APPROACH;  Surgeon: Ollen Gross, MD;  Location: [facility];   Service: Orthopedics;  Laterality: Right;   TOTAL KNEE ARTHROPLASTY Right 07/30/2019   Procedure: TOTAL KNEE ARTHROPLASTY;  Surgeon: Ollen Gross, MD;  Location: [facility];  Service: Orthopedics;  Laterality: Right;    TRANSURETHRAL RESECTION OF BLADDER TUMOR N/A 09/22/2016   Procedure: TRANSURETHRAL RESECTION OF BLADDER TUMOR (TURBT);  Surgeon: Sebastian Ache, MD;  Location: [facility];  Service: Urology;  Laterality: N/A;   Patient Active Problem List   Diagnosis Date Noted   S/P reverse total shoulder arthroplasty, left 10/14/2022   Preoperative cardiovascular examination 10/04/2022   SOB (shortness of breath) 08/02/2022   Insomnia 08/02/2022   Hyponatremia 08/01/2022   Shoulder pain, left 09/14/2021   History of COVID-19 01/06/2021   Diarrhea 09/03/2020   Vitamin D deficiency 09/03/2020   OA (osteoarthritis) of knee 07/30/2019   Tinea corporis 06/08/2019   Psoriatic arthritis (HCC) 04/02/2019   Anemia 03/13/2018   Hot flashes 03/13/2018   S/P cervical spinal fusion 03/31/2017   Neck pain 12/12/2016   Depression with anxiety 10/17/2016   Bladder cancer (HCC) 10/17/2016   OA (osteoarthritis) of hip 07/07/2016   Spinal stenosis of lumbar region with radiculopathy 04/08/2016   Spondylolisthesis of lumbar region 04/08/2016   Spondylosis of cervical region without myelopathy or radiculopathy 04/08/2016   Preventative health care 01/18/2016   Low back pain 01/18/2016   History of chicken pox    Hyperlipidemia 12/15/2014   Allergic rhinitis 06/25/2014   Allergy to bee sting 06/10/2014   TMJ tenderness 11/16/2013   Other malaise and fatigue 05/07/2013   Arthropathy of cervical spine (HCC) 11/27/2012   Muscle spasms of neck 11/27/2012   Parkinson's disease 05/24/2012   Conjunctivochalasis 03/09/2012   Epiretinal membrane 03/09/2012   Hyperopia with astigmatism and presbyopia 03/09/2012   Pseudophakia 03/09/2012   HTN (hypertension) 08/31/2011   Right knee  pain 08/31/2011   Benign hypertensive heart disease without heart failure 05/12/2011    PCP: Bradd Canary, MD   REFERRING PROVIDER: Romero Belling, MD   REFERRING DIAG: Multilevel cervical spondylosis, L neck & trapezial myofascial pain/contracture   THERAPY DIAG:  Cervicalgia  Abnormal posture  Muscle weakness (generalized)  Other muscle spasm  RATIONALE FOR EVALUATION AND TREATMENT: Rehabilitation  ONSET DATE: acute on chronic  NEXT MD VISIT: 05/05/23   SUBJECTIVE:  SUBJECTIVE STATEMENT: Aishwarya reports her pain her pain has been pretty constant of late, despite trying her exercises. Only relief she can get is when she sits in her recliner.  PAIN: Are you having pain? Yes: NPRS scale: 6-7/10 Pain location: L neck and scapula Pain description: constant, shooting up back of neck to head Aggravating factors: any movement of head and neck, dyskinesia from PD meds Relieving factors: sitting still in her recliner  PERTINENT HISTORY:  Chronic neck pain, L shoulder advanced OA s/p reverse TSA on 10/14/22; R TKA 07/2019, B THR 2018 & 2017, posterior C1-2 cervical fusion 2018, rupture of flexor tendon of L hand with failed surgical repair 2020, Parkinson's, psoriatic arthritis, bladder cancer, HTN    PRECAUTIONS: None  HAND DOMINANCE: Right  WEIGHT BEARING RESTRICTIONS: No  FALLS:  Has patient fallen in last 6 months? No  LIVING ENVIRONMENT: Lives with: lives alone Lives in: House/apartment Stairs: Yes: External: 4 steps; on right going up, on left going up, and can reach both Has following equipment at home: Single point cane, Walker - 2 wheeled, Shower bench, bed side commode, and Grab bars  OCCUPATION: Retired  [employer]: Independent and Leisure: volunteering at [employer],  bible study, walking 1/3 mile daily - unable recently due to pain   PATIENT GOALS: "Get rid of this pain so I can go back to my normal activities."   OBJECTIVE: (objective measures completed at initial evaluation unless otherwise dated)  DIAGNOSTIC FINDINGS:  Cervical MRI - results not available, but pt reports she was told she had severe OA  02/07/23 - Cervical spine x-ray: IMPRESSION: Multilevel degenerative change and postoperative change without acute abnormality.  PATIENT SURVEYS:  NDI 26 / 50 = 52.0 % ; 17 / 50 = 34.0 %  COGNITION: Overall cognitive status: Within functional limits for tasks assessed  SENSATION: WFL  POSTURE:  rounded shoulders, forward head, weight shift right, and head laterally shifted to R  PALPATION: Severe increased muscle tension and TTP in L>R UT, LS and cervical paraspinals as well as L periscapular muscles   CERVICAL ROM:   Active ROM eval 04/12/23  Flexion 46 41  Extension 22 * 30 *  Right lateral flexion 16 * 12 *  Left lateral flexion 10 * 20 *  Right rotation 16 * 35 *  Left rotation 18* 25 *   (Blank rows = not tested, * = pain) 02/21/23 - clicking with B rotation  UPPER EXTREMITY ROM:  Active ROM Right  eval Left    eval Right 04/12/23 Left 04/12/23  Shoulder flexion 146 111 156 135  Shoulder extension 50 42 55 38  Shoulder abduction 155 118 178 125  Shoulder adduction      Shoulder internal rotation FIR to sacrum FIR to lateral buttock FIR L5 FIR to lateral buttock  Shoulder external rotation FER T3 FER T1 FER T3 FER T1  Elbow flexion      Elbow extension      Wrist flexion      Wrist extension      Wrist ulnar deviation      Wrist radial deviation      Wrist pronation      Wrist supination       (Blank rows = not tested)  UPPER EXTREMITY MMT:   MMT Right 03/03/23 Left 03/03/23 Right 04/12/23 Left 04/12/23  Shoulder flexion 4+ 4- 5 4, pain in scapula  Shoulder extension 4+ 4+ 5 4+, clicking in neck  Shoulder abduction 4+ 4  5 4, pain in L lat neck  Shoulder adduction      Shoulder internal rotation 4+ 4 4+ 4+  Shoulder external rotation 4 4 4+ 4   (Blank rows = not tested)  CERVICAL SPECIAL TESTS:  Spurling's test: Positive and Distraction test: Positive   TODAY'S TREATMENT:   04/12/23 THERAPEUTIC EXERCISE: to improve flexibility, strength and mobility.  Verbal and tactile cues throughout for technique.  UBE: L1.0 x 1 min (backwards) - deferred d/t increased pain  MANUAL THERAPY: To promote normalized muscle tension, improved flexibility, improved joint mobility, increased ROM, and reduced pain.  STM/DTM, manual TPR and pin & stretch to L>R cervicothoracic paraspinals, L SCM and L periscapular muscles, esp L UT, LS, rhomboids MWM through pain-free cervical extension, lateral flexion and rotation  THERAPEUTIC ACTIVITIES: NDI: 17 / 50 = 34.0 % Cervical and B shoulder ROM UE MMT Goal assessment Recert    04/07/23 THERAPEUTIC EXERCISE: to improve flexibility, strength and mobility.  Verbal and tactile cues throughout for technique.  UBE: L1.0 x 4 min (all backwards) - more comfortable going backwards but still notes increased pain Seated PT resisted neck isometrics 10 sec hold x 5 each - ext, retraction, SB and rot - pt noting the best relief with R SB  MANUAL THERAPY: To promote normalized muscle tension, improved flexibility, improved joint mobility, increased ROM, and reduced pain. STM/DTM, IASTM with edge tool, manual TPR and pin & stretch to L>R cervicothoracic paraspinals and periscapular muscles, esp L UT & LS Cervical NAGs in sitting for B cervical rotation - improving ROM but continued audible clicking more evident with left rotation   04/04/23 THERAPEUTIC EXERCISE: to improve flexibility, strength and mobility.  Verbal and tactile cues throughout for technique.  UBE: L2.0 x 6 min (3' each fwd & back) - more comfortable going backwards Seated neck isometrics 10 sec hold x 5 ea - ext, flex with  fist under chin, retraction, SB and rot - pt noting the best relief with R SB Prone shoulder IR and ER isometrics 5 x 5 sec with patients shoulder in 90 deg abd and elbow at 90 deg - hand toward floor, Marked pain afterwards when tried to lift head.  MANUAL THERAPY: To promote normalized muscle tension, improved flexibility, improved joint mobility, increased ROM, reduced pain, and improved posture TPR, STM and IASTM to L neck and parascapular muscles - began with thera-gun and this was only tolerated mildly at lowest level. Then used EDGE tool.   MODALITIES: TENS unit to L lateral neck and periscapular muscles, 80-150 Hz, intensity to patient tolerance x 10 min with pt appying electrodes herself to see feasibility of ordering home unit. TEN 7000 - difficult for patient to use dials.  Moist heat to L neck and upper shoulder during estim   PATIENT EDUCATION:  Education details: progress with PT and ongoing PT POC  Person educated: Patient Education method: Explanation Education comprehension: verbalized understanding  HOME EXERCISE PROGRAM: Access Code: [email or web address] URL: [email or web address] Date: 03/21/2023 Prepared by: Raynelle Fanning  Exercises - Seated Cervical Retraction  - 2-3 x daily - 7 x weekly - 2 sets - 10 reps - 3-5 sec hold - Shoulder Rolls in Sitting  - 2-3 x daily - 7 x weekly - 2 sets - 10 reps - 3 sec hold - Seated Scapular Retraction  - 2-3 x daily - 7 x weekly - 2 sets - 10 reps - 5 sec hold - Sternocleidomastoid Stretch  - 2-3 x daily - 7 x  weekly - 3 reps - 30 sec hold - Standing Bilateral Low Shoulder Row with Anchored Resistance  - 1 x daily - 3-4 x weekly - 2 sets - 10 reps - 5 sec hold - Scapular Retraction with Resistance Advanced  - 1 x daily - 3-4 x weekly - 2 sets - 10 reps - 5 sec hold - Seated Shoulder Flexion Full Range (Mirrored)  - 1 x daily - 3-4 x weekly - 2 sets - 10 reps - 3 sec hold - Seated Single Arm Shoulder Abduction - Thumb Up  - 1 x daily -  3-4 x weekly - 2 sets - 10 reps - 3 sec hold - Shoulder Internal Rotation with Resistance (Mirrored)  - 1 x daily - 3 x weekly - 1-2 sets - 10 reps - 3 sec hold - Shoulder External Rotation with Anchored Resistance  - 1 x daily - 3 x weekly - 1-2 sets - 10 reps - 3 sec hold - Standing Isometric Cervical Sidebending with Manual Resistance  - 1 x daily - 7 x weekly - 1 sets - 5 reps - 10 sec hold hold - Standing Isometric Cervical Flexion with Manual Resistance  - 1 x daily - 7 x weekly - 1 sets - 5 reps - 10 sec hold - Standing Isometric Cervical Extension with Manual Resistance  - 1 x daily - 7 x weekly - 1 sets - 5 reps - 10 sec hold - Seated Isometric Cervical Rotation  - 1 x daily - 7 x weekly - 1 sets - 5 reps - 10 sec hold   ASSESSMENT:  CLINICAL IMPRESSION: Lananh continues to report constant pain although average pain rating in recent visits has reduced to 5-6/10 as compared to 8-10/10 at eval - she has become very frustrated with the constant nature of the pain.  NDI indicating decreasing pain and improving function with score improved from 52% disability to 34% disability.  Cervical and B shoulder ROM improving in nearly all planes, however pain still present with all cervical motion except flexion and to a lesser degree with all left shoulder ROM.  B shoulder strength improving, with L shoulder still more limited than R, although this may still be in part due to recent reverse TSA.  Progress observed toward several of her LTG's however only LTG #5 met at this time.  Given improvements noted with physical therapy as well as ongoing deficits including constant pain, will recommend recert continued skilled PT 2x/wk for up to 6 weeks as continued benefit noted.  OBJECTIVE IMPAIRMENTS: decreased activity tolerance, decreased endurance, decreased knowledge of condition, decreased mobility, difficulty walking, decreased ROM, decreased strength, hypomobility, increased fascial restrictions, impaired  perceived functional ability, increased muscle spasms, impaired flexibility, impaired UE functional use, improper body mechanics, postural dysfunction, and pain.   ACTIVITY LIMITATIONS: carrying, lifting, standing, sleeping, transfers, bed mobility, bathing, toileting, dressing, reach over head, and hygiene/grooming  PARTICIPATION LIMITATIONS: meal prep, cleaning, laundry, driving, shopping, community activity, occupation, yard work, and church  PERSONAL FACTORS: Age, Fitness, Past/current experiences, Time since onset of injury/illness/exacerbation, and 3+ comorbidities: Chronic neck pain, L shoulder advanced OA s/p reverse TSA on 10/14/22; R TKA 07/2019, B THR 2018 & 2017, posterior C1-2 cervical fusion 2018, rupture of flexor tendon of L hand with failed surgical repair 2020, Parkinson's, psoriatic arthritis, bladder cancer, HTN    are also affecting patient's functional outcome.   REHAB POTENTIAL: Good  CLINICAL DECISION MAKING: Evolving/moderate complexity  EVALUATION COMPLEXITY: Moderate  GOALS: Goals reviewed with patient? Yes  SHORT TERM GOALS: Target date: 03/21/2023, extended to 05/03/2023   Patient will be independent with initial HEP to improve outcomes and carryover.  Baseline: Established 03/03/23 Goal status: MET  03/31/23  2.  Patient will report 25% improvement in neck pain to improve QOL.   Baseline: 8-10/10 Goal status: PARTIALLY MET  04/12/23 - minimal to no change in pain per pt report; pain ratings averaging 5-6/10 in recent visits as compared to 8-10/10 at eval  LONG TERM GOALS: Target date: 04/18/2023, extended to 05/24/2023   Patient will be independent with ongoing/advanced HEP for self-management at home.  Baseline:  Goal status: PARTIALLY MET  04/12/23 - Met for current HEP  2.  Patient will demonstrate improved posture to decrease muscle imbalance. Baseline: rounded shoulders, forward head, weight shift right, and head laterally shifted to R Goal status: IN  PROGRESS  03/31/23 - still with significant forward head and flexed cervical and upper thoracic posture  3.  Patient will report 50-75% improvement in neck pain to improve QOL.  Baseline: 8-10/10 Goal status: IN PROGRESS  04/12/23 - minimal to no change in pain per pt report; pain ratings averaging 5-6/10 in recent visits as compared to 8-10/10 at eval  4.  Patient will demonstrate functional pain free cervical ROM for safety with driving.  Baseline: refer to above cervical ROM table Goal status: IN PROGRESS  04/12/23 - ROM improving in most planes but still painful for al directions except flexion   5.  Patient will report </= 37% on NDI to demonstrate improved functional ability.  Baseline: 26 / 50 = 52.0 % Goal status: MET  04/12/23 - 17 / 50 = 34.0 %  6  Patient will report >/= 50% improvement in her sleep due to decreased neck pain. Baseline: Sleep greatly disturbed for up to 3-5 hrs due to neck pain per NDI Goal status: PARTIALLY MET  04/12/23 - Sleep mildly disturbed  for up to 1-2 hrs per NDI  7.  Patient will be able to resume attending church services w/o limitation due to neck pain. Baseline: Has been unable to attend church Goal status: IN PROGRESS  04/12/23 - She has not been able to return to church d/t limited unsupported sitting tolerance as well as increased pain as she tries to turn her head to observe the services  8. Patient will be able to resume walking for exercise w/o limitation due to neck pain. Baseline: Has been unable to walk d/t pain Goal status: IN PROGRESS  04/12/23 - She has attempting walking for exercise but distance still limited d/t neck pain   PLAN:  PT FREQUENCY: 2x/week  PT DURATION: 8 weeks  PLANNED INTERVENTIONS: Therapeutic exercises, Therapeutic activity, Neuromuscular re-education, Balance training, Patient/Family education, Self Care, Joint mobilization, Dry Needling, Electrical stimulation, Spinal mobilization, Cryotherapy, Moist heat, Taping,  Ultrasound, Ionotophoresis 4mg /ml Dexamethasone, Manual therapy, and Re-evaluation  PLAN FOR NEXT SESSION: gently progress isometrics and cervical flexibility and ROM, postural correction and strengthening; review and progress HEP as able; MT +/- DN as indicated and benefit noted   Marry Guan, PT 04/12/2023, 12:19 PM

## 2023-04-14 ENCOUNTER — Ambulatory Visit: Payer: Medicare Other | Admitting: Physical Therapy

## 2023-04-14 ENCOUNTER — Encounter: Payer: Self-pay | Admitting: Physical Therapy

## 2023-04-14 DIAGNOSIS — M542 Cervicalgia: Secondary | ICD-10-CM | POA: Diagnosis not present

## 2023-04-14 DIAGNOSIS — R293 Abnormal posture: Secondary | ICD-10-CM | POA: Diagnosis not present

## 2023-04-14 DIAGNOSIS — M62838 Other muscle spasm: Secondary | ICD-10-CM | POA: Diagnosis not present

## 2023-04-14 DIAGNOSIS — M25612 Stiffness of left shoulder, not elsewhere classified: Secondary | ICD-10-CM | POA: Diagnosis not present

## 2023-04-14 DIAGNOSIS — M6281 Muscle weakness (generalized): Secondary | ICD-10-CM

## 2023-04-14 NOTE — Therapy (Addendum)
OUTPATIENT PHYSICAL THERAPY TREATMENT     Patient Name: Stephanie [name] MRN: [id number] DOB:06-08-1932, 89 y.o., female Today's Date: 04/14/2023   END OF SESSION:  PT End of Session - 04/14/23 1101     Visit Number 11    Number of Visits 20    Date for PT Re-Evaluation 05/24/23    Authorization Type Medicare & BCBS    Progress Note Due on Visit 20    PT Start Time 1101    PT Stop Time 1158    PT Time Calculation (min) 57 min    Activity Tolerance Patient tolerated treatment well;Patient limited by pain    Behavior During Therapy  [facility] for tasks assessed/performed              Past Medical History:  Diagnosis Date   Allergy    Anemia    past hx    Arthropathy of cervical spine 11/27/2012   Right  Xray at [facility]  On 04/08/2016  AP, lateral, and lateral flexion and extension views of the cervical spine are submitted for evaluation.   No acute fracture identified in the cervical spine.   There is redemonstration of approximately 2 mm of C3 on C4 anterolisthesis in neutral which slightly increases in flexion relative to neutral and extension. Slight C5 on C6 retrolisthesis does not change i   Benign hypertension without congestive heart failure    Benign mole    right hcets mole, bleeds when dried with towel   Bilateral dry eyes    Bladder cancer (HCC) 05-Nov-2016   Cervical spondylosis    Cervicalgia    2 screws in place    Depression with anxiety 11-05-2016   prior to husbands death    Dyspnea    Gross hematuria    developed after first hip replacment, led to indicental finding of bladder tumor, bleeding now resolved    H/O total shoulder replacement, left    History of kidney stones    1970's   Left hand weakness    due to reinjury of flxor tendon s/p tendon surgery march 12-2018   Lumbar stenosis    Malignant tumor of urinary bladder (HCC) 07/2016   Pre-stage I   OA (osteoarthritis)    Parkinson disease dx 2012   neurologist-  dr siddiqui at [facility]    Psoriatic arthritis [facility])     Dr Nickola Major , [place] [facility]    Rupture of flexor tendon of hand 2020   right   Past Surgical History:  Procedure Laterality Date   BIOPSY MASS LEFT FIRST METACARPAL   06/23/2006   benign   CATARACT EXTRACTION W/ INTRAOCULAR LENS  IMPLANT, BILATERAL  1999 and 2003   CERVICAL FUSION  02/2017   c1-c2 ; reports she has 2 screws 2in long in place done at [facility] ; patient exhibits VERY LIMITED NECK ROM   COLONOSCOPY     CYSTOSCOPY W/ RETROGRADES Bilateral 09/22/2016   Procedure: CYSTOSCOPY WITH RETROGRADE PYELOGRAM;  Surgeon: Sebastian Ache, MD;  Location: [facility];  Service: Urology;  Laterality: Bilateral;   D & C HYSTEROSCOPY W/ RESECTION POLYP  07/11/2000   DILATION AND CURETTAGE OF UTERUS  1960's   EXCISION CYST AND DEBRIDEMENT RIGHT WIRST AND REMOVAL FORGEIGN BODY  01/09/2009   FLEXOR TENDON REPAIR Left 12/12/2018   Procedure: REPAIR/TRANSFER FLEXOR DIGITORUM PROFUNDUS OF LEFT SMALL FINGER;  Surgeon: Cindee Salt, MD;  Location: [facility];  Service: Orthopedics;  Laterality: Left;   LAPAROSCOPY  yrs ago   infertility    METACARPOPHALANGEAL JOINT ARTHRODESIS Right 05/25/1996   REVERSE SHOULDER ARTHROPLASTY Left 10/14/2022   Procedure: REVERSE SHOULDER ARTHROPLASTY;  Surgeon: Francena Hanly, MD;  Location: [facility];  Service: Orthopedics;  Laterality: Left;    TENDON REPAIR Left 01/15/2019   Hand   TONSILLECTOMY AND ADENOIDECTOMY  child   TOTAL HIP ARTHROPLASTY Left 07/07/2016   Procedure: LEFT TOTAL HIP ARTHROPLASTY ANTERIOR APPROACH;  Surgeon: Ollen Gross, MD;  Location: [facility];  Service: Orthopedics;  Laterality: Left;   TOTAL HIP ARTHROPLASTY Right 09/28/2017   Procedure: RIGHT TOTAL HIP ARTHROPLASTY ANTERIOR APPROACH;  Surgeon: Ollen Gross, MD;  Location: [facility];  Service: Orthopedics;  Laterality: Right;   TOTAL KNEE ARTHROPLASTY Right 07/30/2019   Procedure: TOTAL KNEE ARTHROPLASTY;  Surgeon: Ollen Gross,  MD;  Location: [facility];  Service: Orthopedics;  Laterality: Right;    TRANSURETHRAL RESECTION OF BLADDER TUMOR N/A 09/22/2016   Procedure: TRANSURETHRAL RESECTION OF BLADDER TUMOR (TURBT);  Surgeon: Sebastian Ache, MD;  Location: [facility];  Service: Urology;  Laterality: N/A;   Patient Active Problem List   Diagnosis Date Noted   S/P reverse total shoulder arthroplasty, left 10/14/2022   Preoperative cardiovascular examination 10/04/2022   SOB (shortness of breath) 08/02/2022   Insomnia 08/02/2022   Hyponatremia 08/01/2022   Shoulder pain, left 09/14/2021   History of COVID-19 01/06/2021   Diarrhea 09/03/2020   Vitamin D deficiency 09/03/2020   OA (osteoarthritis) of knee 07/30/2019   Tinea corporis 06/08/2019   Psoriatic arthritis (HCC) 04/02/2019   Anemia 03/13/2018   Hot flashes 03/13/2018   S/P cervical spinal fusion 03/31/2017   Neck pain 12/12/2016   Depression with anxiety 10/17/2016   Bladder cancer (HCC) 10/17/2016   OA (osteoarthritis) of hip 07/07/2016   Spinal stenosis of lumbar region with radiculopathy 04/08/2016   Spondylolisthesis of lumbar region 04/08/2016   Spondylosis of cervical region without myelopathy or radiculopathy 04/08/2016   Preventative health care 01/18/2016   Low back pain 01/18/2016   History of chicken pox    Hyperlipidemia 12/15/2014   Allergic rhinitis 06/25/2014   Allergy to bee sting 06/10/2014   TMJ tenderness 11/16/2013   Other malaise and fatigue 05/07/2013   Arthropathy of cervical spine (HCC) 11/27/2012   Muscle spasms of neck 11/27/2012   Parkinson's disease 05/24/2012   Conjunctivochalasis 03/09/2012   Epiretinal membrane 03/09/2012   Hyperopia with astigmatism and presbyopia 03/09/2012   Pseudophakia 03/09/2012   HTN (hypertension) 08/31/2011   Right knee pain 08/31/2011   Benign hypertensive heart disease without heart failure 05/12/2011    PCP: Bradd Canary, MD   REFERRING PROVIDER: Romero Belling, MD   REFERRING DIAG: Multilevel cervical spondylosis, L neck & trapezial myofascial pain/contracture   THERAPY DIAG:  Cervicalgia  Abnormal posture  Muscle weakness (generalized)  Other muscle spasm  RATIONALE FOR EVALUATION AND TREATMENT: Rehabilitation  ONSET DATE: acute on chronic  NEXT MD VISIT: 05/05/23   SUBJECTIVE:  SUBJECTIVE STATEMENT: Enessa reports she did not sleep well last night due to difficulty getting comfortable 2 pain.  PAIN: Are you having pain? Yes: NPRS scale: 5-6/10 Pain location: L neck and scapula Pain description: constant, shooting up back of neck to head Aggravating factors: any movement of head and neck, dyskinesia from PD meds Relieving factors: sitting still in her recliner  PERTINENT HISTORY:  Chronic neck pain, L shoulder advanced OA s/p reverse TSA on 10/14/22; R TKA 07/2019, B THR 2018 & 2017, posterior C1-2 cervical fusion 2018, rupture of flexor tendon of L hand with failed surgical repair 2020, Parkinson's, psoriatic arthritis, bladder cancer, HTN    PRECAUTIONS: None  HAND DOMINANCE: Right  WEIGHT BEARING RESTRICTIONS: No  FALLS:  Has patient fallen in last 6 months? No  LIVING ENVIRONMENT: Lives with: lives alone Lives in: House/apartment Stairs: Yes: External: 4 steps; on right going up, on left going up, and can reach both Has following equipment at home: Single point cane, Walker - 2 wheeled, Shower bench, bed side commode, and Grab bars  OCCUPATION: Retired  [employer]: Independent and Leisure: volunteering at [employer], bible study, walking 1/3 mile daily - unable recently due to pain   PATIENT GOALS: "Get rid of this pain so I can go back to my normal activities."   OBJECTIVE: (objective measures completed at initial  evaluation unless otherwise dated)  DIAGNOSTIC FINDINGS:  Cervical MRI - results not available, but pt reports she was told she had severe OA  02/07/23 - Cervical spine x-ray: IMPRESSION: Multilevel degenerative change and postoperative change without acute abnormality.  PATIENT SURVEYS:  NDI 26 / 50 = 52.0 % ; 17 / 50 = 34.0 %  COGNITION: Overall cognitive status: Within functional limits for tasks assessed  SENSATION: WFL  POSTURE:  rounded shoulders, forward head, weight shift right, and head laterally shifted to R  PALPATION: Severe increased muscle tension and TTP in L>R UT, LS and cervical paraspinals as well as L periscapular muscles   CERVICAL ROM:   Active ROM eval 04/12/23  Flexion 46 41  Extension 22 * 30 *  Right lateral flexion 16 * 12 *  Left lateral flexion 10 * 20 *  Right rotation 16 * 35 *  Left rotation 18* 25 *   (Blank rows = not tested, * = pain) 02/21/23 - clicking with B rotation  UPPER EXTREMITY ROM:  Active ROM Right  eval Left    eval Right 04/12/23 Left 04/12/23  Shoulder flexion 146 111 156 135  Shoulder extension 50 42 55 38  Shoulder abduction 155 118 178 125  Shoulder adduction      Shoulder internal rotation FIR to sacrum FIR to lateral buttock FIR L5 FIR to lateral buttock  Shoulder external rotation FER T3 FER T1 FER T3 FER T1  Elbow flexion      Elbow extension      Wrist flexion      Wrist extension      Wrist ulnar deviation      Wrist radial deviation      Wrist pronation      Wrist supination       (Blank rows = not tested)  UPPER EXTREMITY MMT:   MMT Right 03/03/23 Left 03/03/23 Right 04/12/23 Left 04/12/23  Shoulder flexion 4+ 4- 5 4, pain in scapula  Shoulder extension 4+ 4+ 5 4+, clicking in neck  Shoulder abduction 4+ 4 5 4, pain in L lat neck  Shoulder adduction  Shoulder internal rotation 4+ 4 4+ 4+  Shoulder external rotation 4 4 4+ 4   (Blank rows = not tested)  CERVICAL SPECIAL TESTS:  Spurling's test:  Positive and Distraction test: Positive   TODAY'S TREATMENT:   04/14/23 THERAPEUTIC EXERCISE: to improve flexibility, strength and mobility.  Verbal and tactile cues throughout for technique.  UBE: L1.0 x 4 min (backwards)  S/L L scapular retraction and depression 10 x 3", 2 sets S/L bow and arrow open book stretch keeping elbow bent and rotating head with trunk rotation x 10  MANUAL THERAPY: To promote normalized muscle tension, improved flexibility, improved joint mobility, increased ROM, and reduced pain. STM/DTM, manual TPR to L periscapular muscles, esp L UT, LS, rhomboids, middle traps and subscapularis in side-lying L subscapular release in side-lying L scapular mobs in side-lying - emphasis on medial glide, retraction and depression MWM through pain-free cervical extension, lateral flexion and rotation in supine Skilled palpation and monitoring of soft tissue during DN Trigger Point Dry-Needling  Treatment instructions: Expect mild to moderate muscle soreness. S/S of pneumothorax if dry needled over a lung field, and to seek immediate medical attention should they occur. Patient verbalized understanding of these instructions and education. Patient Consent Given: Yes Education handout provided: Previously provided Muscles treated: L SCM Electrical stimulation performed: No Parameters: N/A Treatment response/outcome: Twitch Response Elicited and Palpable Increase in Muscle Length STM/DTM, XFM and pin & stretch to muscles to L SCM in supine  MODALITIES/SELF CARE: Instruction in set up and use of patient's daughter's home TENS unit   04/12/23 THERAPEUTIC EXERCISE: to improve flexibility, strength and mobility.  Verbal and tactile cues throughout for technique.  UBE: L1.0 x 1 min (backwards) - deferred d/t increased pain  MANUAL THERAPY: To promote normalized muscle tension, improved flexibility, improved joint mobility, increased ROM, and reduced pain.  STM/DTM, manual TPR and  pin & stretch to L>R cervicothoracic paraspinals, L SCM and L periscapular muscles, esp L UT, LS, rhomboids MWM through pain-free cervical extension, lateral flexion and rotation  THERAPEUTIC ACTIVITIES: NDI: 17 / 50 = 34.0 % Cervical and B shoulder ROM UE MMT Goal assessment Recert    04/07/23 THERAPEUTIC EXERCISE: to improve flexibility, strength and mobility.  Verbal and tactile cues throughout for technique.  UBE: L1.0 x 4 min (all backwards) - more comfortable going backwards but still notes increased pain Seated PT resisted neck isometrics 10 sec hold x 5 each - ext, retraction, SB and rot - pt noting the best relief with R SB  MANUAL THERAPY: To promote normalized muscle tension, improved flexibility, improved joint mobility, increased ROM, and reduced pain. STM/DTM, IASTM with edge tool, manual TPR and pin & stretch to L>R cervicothoracic paraspinals and periscapular muscles, esp L UT & LS Cervical NAGs in sitting for B cervical rotation - improving ROM but continued audible clicking more evident with left rotation   PATIENT EDUCATION:  Education details:  Instruction in set-up and use of home TENS unit    Person educated: Patient Education method: Explanation, Demonstration, and Verbal cues Education comprehension: verbalized understanding  HOME EXERCISE PROGRAM: Access Code: [email or web address] URL: [email or web address] Date: 03/21/2023 Prepared by: Raynelle Fanning  Exercises - Seated Cervical Retraction  - 2-3 x daily - 7 x weekly - 2 sets - 10 reps - 3-5 sec hold - Shoulder Rolls in Sitting  - 2-3 x daily - 7 x weekly - 2 sets - 10 reps - 3 sec hold - Seated Scapular Retraction  - 2-3 x daily -  7 x weekly - 2 sets - 10 reps - 5 sec hold - Sternocleidomastoid Stretch  - 2-3 x daily - 7 x weekly - 3 reps - 30 sec hold - Standing Bilateral Low Shoulder Row with Anchored Resistance  - 1 x daily - 3-4 x weekly - 2 sets - 10 reps - 5 sec hold - Scapular Retraction with  Resistance Advanced  - 1 x daily - 3-4 x weekly - 2 sets - 10 reps - 5 sec hold - Seated Shoulder Flexion Full Range (Mirrored)  - 1 x daily - 3-4 x weekly - 2 sets - 10 reps - 3 sec hold - Seated Single Arm Shoulder Abduction - Thumb Up  - 1 x daily - 3-4 x weekly - 2 sets - 10 reps - 3 sec hold - Shoulder Internal Rotation with Resistance (Mirrored)  - 1 x daily - 3 x weekly - 1-2 sets - 10 reps - 3 sec hold - Shoulder External Rotation with Anchored Resistance  - 1 x daily - 3 x weekly - 1-2 sets - 10 reps - 3 sec hold - Standing Isometric Cervical Sidebending with Manual Resistance  - 1 x daily - 7 x weekly - 1 sets - 5 reps - 10 sec hold hold - Standing Isometric Cervical Flexion with Manual Resistance  - 1 x daily - 7 x weekly - 1 sets - 5 reps - 10 sec hold - Standing Isometric Cervical Extension with Manual Resistance  - 1 x daily - 7 x weekly - 1 sets - 5 reps - 10 sec hold - Seated Isometric Cervical Rotation  - 1 x daily - 7 x weekly - 1 sets - 5 reps - 10 sec hold   ASSESSMENT:  CLINICAL IMPRESSION: Altair reports sleep disturbance due to pain last night, with pain remaining along the L medial scapula and into lateral side of neck.  MT focusing on reducing abnormal muscle tension and promoting improved scapular mobility.  Patient's ability to stretch tight muscles limited by painful cervical ROM, therefore tried to provide stretch via MT especially in SCM.  Patient reports her daughter gave her a TENS unit, but she is uncertain how to use it therefore remainder of session focusing on training in set up and use of the home TENS unit.  OBJECTIVE IMPAIRMENTS: decreased activity tolerance, decreased endurance, decreased knowledge of condition, decreased mobility, difficulty walking, decreased ROM, decreased strength, hypomobility, increased fascial restrictions, impaired perceived functional ability, increased muscle spasms, impaired flexibility, impaired UE functional use, improper body  mechanics, postural dysfunction, and pain.   ACTIVITY LIMITATIONS: carrying, lifting, standing, sleeping, transfers, bed mobility, bathing, toileting, dressing, reach over head, and hygiene/grooming  PARTICIPATION LIMITATIONS: meal prep, cleaning, laundry, driving, shopping, community activity, occupation, yard work, and church  PERSONAL FACTORS: Age, Fitness, Past/current experiences, Time since onset of injury/illness/exacerbation, and 3+ comorbidities: Chronic neck pain, L shoulder advanced OA s/p reverse TSA on 10/14/22; R TKA 07/2019, B THR 2018 & 2017, posterior C1-2 cervical fusion 2018, rupture of flexor tendon of L hand with failed surgical repair 2020, Parkinson's, psoriatic arthritis, bladder cancer, HTN    are also affecting patient's functional outcome.   REHAB POTENTIAL: Good  CLINICAL DECISION MAKING: Evolving/moderate complexity  EVALUATION COMPLEXITY: Moderate   GOALS: Goals reviewed with patient? Yes  SHORT TERM GOALS: Target date: 03/21/2023, extended to 05/03/2023   Patient will be independent with initial HEP to improve outcomes and carryover.  Baseline: Established 03/03/23 Goal status: MET  03/31/23  2.  Patient will report 25% improvement in neck pain to improve QOL.   Baseline: 8-10/10 Goal status: PARTIALLY MET  04/12/23 - minimal to no change in pain per pt report; pain ratings averaging 5-6/10 in recent visits as compared to 8-10/10 at eval  LONG TERM GOALS: Target date: 04/18/2023, extended to 05/24/2023   Patient will be independent with ongoing/advanced HEP for self-management at home.  Baseline:  Goal status: PARTIALLY MET  04/12/23 - Met for current HEP  2.  Patient will demonstrate improved posture to decrease muscle imbalance. Baseline: rounded shoulders, forward head, weight shift right, and head laterally shifted to R Goal status: IN PROGRESS  03/31/23 - still with significant forward head and flexed cervical and upper thoracic posture  3.  Patient will  report 50-75% improvement in neck pain to improve QOL.  Baseline: 8-10/10 Goal status: IN PROGRESS  04/12/23 - minimal to no change in pain per pt report; pain ratings averaging 5-6/10 in recent visits as compared to 8-10/10 at eval  4.  Patient will demonstrate functional pain free cervical ROM for safety with driving.  Baseline: refer to above cervical ROM table Goal status: IN PROGRESS  04/12/23 - ROM improving in most planes but still painful for al directions except flexion   5.  Patient will report </= 37% on NDI to demonstrate improved functional ability.  Baseline: 26 / 50 = 52.0 % Goal status: MET  04/12/23 - 17 / 50 = 34.0 %  6  Patient will report >/= 50% improvement in her sleep due to decreased neck pain. Baseline: Sleep greatly disturbed for up to 3-5 hrs due to neck pain per NDI Goal status: PARTIALLY MET  04/12/23 - Sleep mildly disturbed  for up to 1-2 hrs per NDI  7.  Patient will be able to resume attending church services w/o limitation due to neck pain. Baseline: Has been unable to attend church Goal status: IN PROGRESS  04/12/23 - She has not been able to return to church d/t limited unsupported sitting tolerance as well as increased pain as she tries to turn her head to observe the services  8. Patient will be able to resume walking for exercise w/o limitation due to neck pain. Baseline: Has been unable to walk d/t pain Goal status: IN PROGRESS  04/12/23 - She has attempting walking for exercise but distance still limited d/t neck pain   PLAN:  PT FREQUENCY: 2x/week  PT DURATION: 8 weeks  PLANNED INTERVENTIONS: Therapeutic exercises, Therapeutic activity, Neuromuscular re-education, Balance training, Patient/Family education, Self Care, Joint mobilization, Dry Needling, Electrical stimulation, Spinal mobilization, Cryotherapy, Moist heat, Taping, Ultrasound, Ionotophoresis 4mg /ml Dexamethasone, Manual therapy, and Re-evaluation  PLAN FOR NEXT SESSION: gently progress  isometrics and cervical flexibility and ROM, postural correction and strengthening; review and progress HEP as able; MT +/- DN as indicated and benefit noted   Marry Guan, PT 04/14/2023, 12:43 PM

## 2023-04-16 DIAGNOSIS — I2510 Atherosclerotic heart disease of native coronary artery without angina pectoris: Secondary | ICD-10-CM

## 2023-04-16 HISTORY — DX: Atherosclerotic heart disease of native coronary artery without angina pectoris: I25.10

## 2023-04-18 ENCOUNTER — Ambulatory Visit: Payer: Medicare Other | Attending: Neurology

## 2023-04-18 ENCOUNTER — Telehealth (HOSPITAL_COMMUNITY): Payer: Self-pay | Admitting: *Deleted

## 2023-04-18 DIAGNOSIS — M6281 Muscle weakness (generalized): Secondary | ICD-10-CM | POA: Diagnosis not present

## 2023-04-18 DIAGNOSIS — M62838 Other muscle spasm: Secondary | ICD-10-CM | POA: Diagnosis not present

## 2023-04-18 DIAGNOSIS — R293 Abnormal posture: Secondary | ICD-10-CM | POA: Insufficient documentation

## 2023-04-18 DIAGNOSIS — M542 Cervicalgia: Secondary | ICD-10-CM | POA: Diagnosis not present

## 2023-04-18 NOTE — Telephone Encounter (Signed)
Reaching out to patient to offer assistance regarding upcoming cardiac imaging study; pt verbalizes understanding of appt date/time, parking situation and where to check in, pre-test NPO status and medications ordered, and verified current allergies; name and call back number provided for further questions should they arise  Jermario Kalmar RN Navigator Cardiac Imaging [facility] [phone] office [phone] cell  Patient to take 100mg metoprolol tartrate two hours prior to her cardiac CT scan. She is aware to arrive at 12pm. 

## 2023-04-18 NOTE — Therapy (Addendum)
OUTPATIENT PHYSICAL THERAPY TREATMENT     Patient Name: Stephanie [name] MRN: [id number] DOB:[birth year], 89 y.o., female Today's Date: 04/18/2023   END OF SESSION:  PT End of Session - 04/18/23 1406     Visit Number 12    Number of Visits 20    Date for PT Re-Evaluation 05/24/23    Authorization Type Medicare & BCBS    Progress Note Due on Visit 20    PT Start Time 1403    PT Stop Time 1456    PT Time Calculation (min) 53 min    Activity Tolerance Patient tolerated treatment well;Patient limited by pain               Past Medical History:  Diagnosis Date   Allergy    Anemia    past hx    Arthropathy of cervical spine 11/27/2012   Right  Xray at [facility]  On 04/08/2016  AP, lateral, and lateral flexion and extension views of the cervical spine are submitted for evaluation.   No acute fracture identified in the cervical spine.   There is redemonstration of approximately 2 mm of C3 on C4 anterolisthesis in neutral which slightly increases in flexion relative to neutral and extension. Slight C5 on C6 retrolisthesis does not change i   Benign hypertension without congestive heart failure    Benign mole    right hcets mole, bleeds when dried with towel   Bilateral dry eyes    Bladder cancer (HCC) 2016/11/03   Cervical spondylosis    Cervicalgia    2 screws in place    Depression with anxiety 2016-11-03   prior to husbands death    Dyspnea    Gross hematuria    developed after first hip replacment, led to indicental finding of bladder tumor, bleeding now resolved    H/O total shoulder replacement, left    History of kidney stones    1970's   Left hand weakness    due to reinjury of flxor tendon s/p tendon surgery march 12-2018   Lumbar stenosis    Malignant tumor of urinary bladder (HCC) 07/2016   Pre-stage I   OA (osteoarthritis)    Parkinson disease dx 2012   neurologist-  dr siddiqui at [facility]   Psoriatic arthritis [facility])     Dr Nickola Major , [place] [facility]    Rupture of  flexor tendon of hand 2020   right   Past Surgical History:  Procedure Laterality Date   BIOPSY MASS LEFT FIRST METACARPAL   06/23/2006   benign   CATARACT EXTRACTION W/ INTRAOCULAR LENS  IMPLANT, BILATERAL  1999 and 2003   CERVICAL FUSION  02/2017   c1-c2 ; reports she has 2 screws 2in long in place done at [facility] ; patient exhibits VERY LIMITED NECK ROM   COLONOSCOPY     CYSTOSCOPY W/ RETROGRADES Bilateral 09/22/2016   Procedure: CYSTOSCOPY WITH RETROGRADE PYELOGRAM;  Surgeon: Sebastian Ache, MD;  Location: [facility];  Service: Urology;  Laterality: Bilateral;   D & C HYSTEROSCOPY W/ RESECTION POLYP  07/11/2000   DILATION AND CURETTAGE OF UTERUS  1960's   EXCISION CYST AND DEBRIDEMENT RIGHT WIRST AND REMOVAL FORGEIGN BODY  01/09/2009   FLEXOR TENDON REPAIR Left 12/12/2018   Procedure: REPAIR/TRANSFER FLEXOR DIGITORUM PROFUNDUS OF LEFT SMALL FINGER;  Surgeon: Cindee Salt, MD;  Location: [facility];  Service: Orthopedics;  Laterality: Left;   LAPAROSCOPY  yrs ago   infertility    METACARPOPHALANGEAL  JOINT ARTHRODESIS Right 05/25/1996   REVERSE SHOULDER ARTHROPLASTY Left 10/14/2022   Procedure: REVERSE SHOULDER ARTHROPLASTY;  Surgeon: Francena Hanly, MD;  Location: [facility];  Service: Orthopedics;  Laterality: Left;    TENDON REPAIR Left 01/15/2019   Hand   TONSILLECTOMY AND ADENOIDECTOMY  child   TOTAL HIP ARTHROPLASTY Left 07/07/2016   Procedure: LEFT TOTAL HIP ARTHROPLASTY ANTERIOR APPROACH;  Surgeon: Ollen Gross, MD;  Location: [facility];  Service: Orthopedics;  Laterality: Left;   TOTAL HIP ARTHROPLASTY Right 09/28/2017   Procedure: RIGHT TOTAL HIP ARTHROPLASTY ANTERIOR APPROACH;  Surgeon: Ollen Gross, MD;  Location: [facility];  Service: Orthopedics;  Laterality: Right;   TOTAL KNEE ARTHROPLASTY Right 07/30/2019   Procedure: TOTAL KNEE ARTHROPLASTY;  Surgeon: Ollen Gross, MD;  Location: [facility];  Service: Orthopedics;  Laterality: Right;     TRANSURETHRAL RESECTION OF BLADDER TUMOR N/A 09/22/2016   Procedure: TRANSURETHRAL RESECTION OF BLADDER TUMOR (TURBT);  Surgeon: Sebastian Ache, MD;  Location: [facility];  Service: Urology;  Laterality: N/A;   Patient Active Problem List   Diagnosis Date Noted   S/P reverse total shoulder arthroplasty, left 10/14/2022   Preoperative cardiovascular examination 10/04/2022   SOB (shortness of breath) 08/02/2022   Insomnia 08/02/2022   Hyponatremia 08/01/2022   Shoulder pain, left 09/14/2021   History of COVID-19 01/06/2021   Diarrhea 09/03/2020   Vitamin D deficiency 09/03/2020   OA (osteoarthritis) of knee 07/30/2019   Tinea corporis 06/08/2019   Psoriatic arthritis (HCC) 04/02/2019   Anemia 03/13/2018   Hot flashes 03/13/2018   S/P cervical spinal fusion 03/31/2017   Neck pain 12/12/2016   Depression with anxiety 10/17/2016   Bladder cancer (HCC) 10/17/2016   OA (osteoarthritis) of hip 07/07/2016   Spinal stenosis of lumbar region with radiculopathy 04/08/2016   Spondylolisthesis of lumbar region 04/08/2016   Spondylosis of cervical region without myelopathy or radiculopathy 04/08/2016   Preventative health care 01/18/2016   Low back pain 01/18/2016   History of chicken pox    Hyperlipidemia 12/15/2014   Allergic rhinitis 06/25/2014   Allergy to bee sting 06/10/2014   TMJ tenderness 11/16/2013   Other malaise and fatigue 05/07/2013   Arthropathy of cervical spine (HCC) 11/27/2012   Muscle spasms of neck 11/27/2012   Parkinson's disease 05/24/2012   Conjunctivochalasis 03/09/2012   Epiretinal membrane 03/09/2012   Hyperopia with astigmatism and presbyopia 03/09/2012   Pseudophakia 03/09/2012   HTN (hypertension) 08/31/2011   Right knee pain 08/31/2011   Benign hypertensive heart disease without heart failure 05/12/2011    PCP: Bradd Canary, MD   REFERRING PROVIDER: Romero Belling, MD   REFERRING DIAG: Multilevel cervical spondylosis, L  neck & trapezial myofascial pain/contracture   THERAPY DIAG:  Cervicalgia  Abnormal posture  Muscle weakness (generalized)  Other muscle spasm  RATIONALE FOR EVALUATION AND TREATMENT: Rehabilitation  ONSET DATE: acute on chronic  NEXT MD VISIT: 05/05/23   SUBJECTIVE:  SUBJECTIVE STATEMENT: Pt notes constant pain in L side of neck.   PAIN: Are you having pain? Yes: NPRS scale: 5-6/10 Pain location: L neck and scapula Pain description: constant, shooting up back of neck to head Aggravating factors: any movement of head and neck, dyskinesia from PD meds Relieving factors: sitting still in her recliner  PERTINENT HISTORY:  Chronic neck pain, L shoulder advanced OA s/p reverse TSA on 10/14/22; R TKA 07/2019, B THR 2018 & 2017, posterior C1-2 cervical fusion 2018, rupture of flexor tendon of L hand with failed surgical repair 2020, Parkinson's, psoriatic arthritis, bladder cancer, HTN    PRECAUTIONS: None  HAND DOMINANCE: Right  WEIGHT BEARING RESTRICTIONS: No  FALLS:  Has patient fallen in last 6 months? No  LIVING ENVIRONMENT: Lives with: lives alone Lives in: House/apartment Stairs: Yes: External: 4 steps; on right going up, on left going up, and can reach both Has following equipment at home: Single point cane, Walker - 2 wheeled, Shower bench, bed side commode, and Grab bars  OCCUPATION: Retired  [employer]: Independent and Leisure: volunteering at [employer], bible study, walking 1/3 mile daily - unable recently due to pain   PATIENT GOALS: "Get rid of this pain so I can go back to my normal activities."   OBJECTIVE: (objective measures completed at initial evaluation unless otherwise dated)  DIAGNOSTIC FINDINGS:  Cervical MRI - results not available, but pt reports she was  told she had severe OA  02/07/23 - Cervical spine x-ray: IMPRESSION: Multilevel degenerative change and postoperative change without acute abnormality.  PATIENT SURVEYS:  NDI 26 / 50 = 52.0 % ; 17 / 50 = 34.0 %  COGNITION: Overall cognitive status: Within functional limits for tasks assessed  SENSATION: WFL  POSTURE:  rounded shoulders, forward head, weight shift right, and head laterally shifted to R  PALPATION: Severe increased muscle tension and TTP in L>R UT, LS and cervical paraspinals as well as L periscapular muscles   CERVICAL ROM:   Active ROM eval 04/12/23  Flexion 46 41  Extension 22 * 30 *  Right lateral flexion 16 * 12 *  Left lateral flexion 10 * 20 *  Right rotation 16 * 35 *  Left rotation 18* 25 *   (Blank rows = not tested, * = pain) 02/21/23 - clicking with B rotation  UPPER EXTREMITY ROM:  Active ROM Right  eval Left    eval Right 04/12/23 Left 04/12/23  Shoulder flexion 146 111 156 135  Shoulder extension 50 42 55 38  Shoulder abduction 155 118 178 125  Shoulder adduction      Shoulder internal rotation FIR to sacrum FIR to lateral buttock FIR L5 FIR to lateral buttock  Shoulder external rotation FER T3 FER T1 FER T3 FER T1  Elbow flexion      Elbow extension      Wrist flexion      Wrist extension      Wrist ulnar deviation      Wrist radial deviation      Wrist pronation      Wrist supination       (Blank rows = not tested)  UPPER EXTREMITY MMT:   MMT Right 03/03/23 Left 03/03/23 Right 04/12/23 Left 04/12/23  Shoulder flexion 4+ 4- 5 4, pain in scapula  Shoulder extension 4+ 4+ 5 4+, clicking in neck  Shoulder abduction 4+ 4 5 4, pain in L lat neck  Shoulder adduction      Shoulder internal rotation 4+  4 4+ 4+  Shoulder external rotation 4 4 4+ 4   (Blank rows = not tested)  CERVICAL SPECIAL TESTS:  Spurling's test: Positive and Distraction test: Positive   TODAY'S TREATMENT:  04/18/23 THERAPEUTIC EXERCISE: to improve flexibility,  strength and mobility.  Verbal and tactile cues throughout for technique.  UBE: L1.0 x 4 min  Seated cerv retraction 10x Scap squeeze x 10  Shoulder rolls x 10  Seated shoulder press into beach ball 2x10 Seated isometric IR 2x10   MANUAL THERAPY: To promote normalized muscle tension, improved flexibility, improved joint mobility, increased ROM, and reduced pain. STM/DTM, manual TPR to L periscapular muscles,  L UT, LS  Estim: IFC 80-150 Hz intensity to tolerance x 10 min 04/14/23 THERAPEUTIC EXERCISE: to improve flexibility, strength and mobility.  Verbal and tactile cues throughout for technique.  UBE: L1.0 x 4 min (backwards)  S/L L scapular retraction and depression 10 x 3", 2 sets S/L bow and arrow open book stretch keeping elbow bent and rotating head with trunk rotation x 10  MANUAL THERAPY: To promote normalized muscle tension, improved flexibility, improved joint mobility, increased ROM, and reduced pain. STM/DTM, manual TPR to L periscapular muscles, esp L UT, LS, rhomboids, middle traps and subscapularis in side-lying L subscapular release in side-lying L scapular mobs in side-lying - emphasis on medial glide, retraction and depression MWM through pain-free cervical extension, lateral flexion and rotation in supine Skilled palpation and monitoring of soft tissue during DN Trigger Point Dry-Needling  Treatment instructions: Expect mild to moderate muscle soreness. S/S of pneumothorax if dry needled over a lung field, and to seek immediate medical attention should they occur. Patient verbalized understanding of these instructions and education. Patient Consent Given: Yes Education handout provided: Previously provided Muscles treated: L SCM Electrical stimulation performed: No Parameters: N/A Treatment response/outcome: Twitch Response Elicited and Palpable Increase in Muscle Length STM/DTM, XFM and pin & stretch to muscles to L SCM in supine  MODALITIES/SELF  CARE: Instruction in set up and use of patient's daughter's home TENS unit   04/12/23 THERAPEUTIC EXERCISE: to improve flexibility, strength and mobility.  Verbal and tactile cues throughout for technique.  UBE: L1.0 x 1 min (backwards) - deferred d/t increased pain  MANUAL THERAPY: To promote normalized muscle tension, improved flexibility, improved joint mobility, increased ROM, and reduced pain.  STM/DTM, manual TPR and pin & stretch to L>R cervicothoracic paraspinals, L SCM and L periscapular muscles, esp L UT, LS, rhomboids MWM through pain-free cervical extension, lateral flexion and rotation  THERAPEUTIC ACTIVITIES: NDI: 17 / 50 = 34.0 % Cervical and B shoulder ROM UE MMT Goal assessment Recert    04/07/23 THERAPEUTIC EXERCISE: to improve flexibility, strength and mobility.  Verbal and tactile cues throughout for technique.  UBE: L1.0 x 4 min (all backwards) - more comfortable going backwards but still notes increased pain Seated PT resisted neck isometrics 10 sec hold x 5 each - ext, retraction, SB and rot - pt noting the best relief with R SB  MANUAL THERAPY: To promote normalized muscle tension, improved flexibility, improved joint mobility, increased ROM, and reduced pain. STM/DTM, IASTM with edge tool, manual TPR and pin & stretch to L>R cervicothoracic paraspinals and periscapular muscles, esp L UT & LS Cervical NAGs in sitting for B cervical rotation - improving ROM but continued audible clicking more evident with left rotation   PATIENT EDUCATION:  Education details:  Instruction in set-up and use of home TENS unit    Person educated:  Patient Education method: Explanation, Demonstration, and Verbal cues Education comprehension: verbalized understanding  HOME EXERCISE PROGRAM: Access Code: [email or web address] URL: [email or web address] Date: 03/21/2023 Prepared by: Raynelle Fanning  Exercises - Seated Cervical Retraction  - 2-3 x daily - 7 x weekly - 2 sets - 10 reps -  3-5 sec hold - Shoulder Rolls in Sitting  - 2-3 x daily - 7 x weekly - 2 sets - 10 reps - 3 sec hold - Seated Scapular Retraction  - 2-3 x daily - 7 x weekly - 2 sets - 10 reps - 5 sec hold - Sternocleidomastoid Stretch  - 2-3 x daily - 7 x weekly - 3 reps - 30 sec hold - Standing Bilateral Low Shoulder Row with Anchored Resistance  - 1 x daily - 3-4 x weekly - 2 sets - 10 reps - 5 sec hold - Scapular Retraction with Resistance Advanced  - 1 x daily - 3-4 x weekly - 2 sets - 10 reps - 5 sec hold - Seated Shoulder Flexion Full Range (Mirrored)  - 1 x daily - 3-4 x weekly - 2 sets - 10 reps - 3 sec hold - Seated Single Arm Shoulder Abduction - Thumb Up  - 1 x daily - 3-4 x weekly - 2 sets - 10 reps - 3 sec hold - Shoulder Internal Rotation with Resistance (Mirrored)  - 1 x daily - 3 x weekly - 1-2 sets - 10 reps - 3 sec hold - Shoulder External Rotation with Anchored Resistance  - 1 x daily - 3 x weekly - 1-2 sets - 10 reps - 3 sec hold - Standing Isometric Cervical Sidebending with Manual Resistance  - 1 x daily - 7 x weekly - 1 sets - 5 reps - 10 sec hold hold - Standing Isometric Cervical Flexion with Manual Resistance  - 1 x daily - 7 x weekly - 1 sets - 5 reps - 10 sec hold - Standing Isometric Cervical Extension with Manual Resistance  - 1 x daily - 7 x weekly - 1 sets - 5 reps - 10 sec hold - Seated Isometric Cervical Rotation  - 1 x daily - 7 x weekly - 1 sets - 5 reps - 10 sec hold   ASSESSMENT:  CLINICAL IMPRESSION: Pt continues to have pain on L side of neck. She presents with tightness along the periscapular muscles, UT, LS. Gently added isometrics and mobility exercises. Attempted yellow TB ER but this caused pain on L side of neck so discontinued. Estim post session to L periscap muscles per pt request w/ moist heat. She has purchased a TENS device.   OBJECTIVE IMPAIRMENTS: decreased activity tolerance, decreased endurance, decreased knowledge of condition, decreased mobility,  difficulty walking, decreased ROM, decreased strength, hypomobility, increased fascial restrictions, impaired perceived functional ability, increased muscle spasms, impaired flexibility, impaired UE functional use, improper body mechanics, postural dysfunction, and pain.   ACTIVITY LIMITATIONS: carrying, lifting, standing, sleeping, transfers, bed mobility, bathing, toileting, dressing, reach over head, and hygiene/grooming  PARTICIPATION LIMITATIONS: meal prep, cleaning, laundry, driving, shopping, community activity, occupation, yard work, and church  PERSONAL FACTORS: Age, Fitness, Past/current experiences, Time since onset of injury/illness/exacerbation, and 3+ comorbidities: Chronic neck pain, L shoulder advanced OA s/p reverse TSA on 10/14/22; R TKA 07/2019, B THR 2018 & 2017, posterior C1-2 cervical fusion 2018, rupture of flexor tendon of L hand with failed surgical repair 2020, Parkinson's, psoriatic arthritis, bladder cancer, HTN    are also affecting patient's functional outcome.   REHAB  POTENTIAL: Good  CLINICAL DECISION MAKING: Evolving/moderate complexity  EVALUATION COMPLEXITY: Moderate   GOALS: Goals reviewed with patient? Yes  SHORT TERM GOALS: Target date: 03/21/2023, extended to 05/03/2023   Patient will be independent with initial HEP to improve outcomes and carryover.  Baseline: Established 03/03/23 Goal status: MET  03/31/23  2.  Patient will report 25% improvement in neck pain to improve QOL.   Baseline: 8-10/10 Goal status: PARTIALLY MET  04/12/23 - minimal to no change in pain per pt report; pain ratings averaging 5-6/10 in recent visits as compared to 8-10/10 at eval  LONG TERM GOALS: Target date: 04/18/2023, extended to 05/24/2023   Patient will be independent with ongoing/advanced HEP for self-management at home.  Baseline:  Goal status: PARTIALLY MET  04/12/23 - Met for current HEP  2.  Patient will demonstrate improved posture to decrease muscle  imbalance. Baseline: rounded shoulders, forward head, weight shift right, and head laterally shifted to R Goal status: IN PROGRESS  03/31/23 - still with significant forward head and flexed cervical and upper thoracic posture  3.  Patient will report 50-75% improvement in neck pain to improve QOL.  Baseline: 8-10/10 Goal status: IN PROGRESS  04/12/23 - minimal to no change in pain per pt report; pain ratings averaging 5-6/10 in recent visits as compared to 8-10/10 at eval  4.  Patient will demonstrate functional pain free cervical ROM for safety with driving.  Baseline: refer to above cervical ROM table Goal status: IN PROGRESS  04/12/23 - ROM improving in most planes but still painful for al directions except flexion   5.  Patient will report </= 37% on NDI to demonstrate improved functional ability.  Baseline: 26 / 50 = 52.0 % Goal status: MET  04/12/23 - 17 / 50 = 34.0 %  6  Patient will report >/= 50% improvement in her sleep due to decreased neck pain. Baseline: Sleep greatly disturbed for up to 3-5 hrs due to neck pain per NDI Goal status: PARTIALLY MET  04/12/23 - Sleep mildly disturbed  for up to 1-2 hrs per NDI  7.  Patient will be able to resume attending church services w/o limitation due to neck pain. Baseline: Has been unable to attend church Goal status: IN PROGRESS  04/12/23 - She has not been able to return to church d/t limited unsupported sitting tolerance as well as increased pain as she tries to turn her head to observe the services  8. Patient will be able to resume walking for exercise w/o limitation due to neck pain. Baseline: Has been unable to walk d/t pain Goal status: IN PROGRESS  04/12/23 - She has attempting walking for exercise but distance still limited d/t neck pain   PLAN:  PT FREQUENCY: 2x/week  PT DURATION: 8 weeks  PLANNED INTERVENTIONS: Therapeutic exercises, Therapeutic activity, Neuromuscular re-education, Balance training, Patient/Family education,  Self Care, Joint mobilization, Dry Needling, Electrical stimulation, Spinal mobilization, Cryotherapy, Moist heat, Taping, Ultrasound, Ionotophoresis 4mg /ml Dexamethasone, Manual therapy, and Re-evaluation  PLAN FOR NEXT SESSION: gently progress isometrics and cervical flexibility and ROM, postural correction and strengthening; review and progress HEP as able; MT +/- DN as indicated and benefit noted   Darleene Cleaver, PTA 04/18/2023, 3:33 PM

## 2023-04-19 ENCOUNTER — Ambulatory Visit (HOSPITAL_COMMUNITY)
Admission: RE | Admit: 2023-04-19 | Discharge: 2023-04-19 | Disposition: A | Discharge status: Home / Self Care | Payer: Medicare Other | Source: Ambulatory Visit | Attending: Cardiology | Admitting: Cardiology

## 2023-04-19 DIAGNOSIS — I700 Atherosclerosis of aorta: Secondary | ICD-10-CM

## 2023-04-19 DIAGNOSIS — R0602 Shortness of breath: Secondary | ICD-10-CM

## 2023-04-19 DIAGNOSIS — E785 Hyperlipidemia, unspecified: Secondary | ICD-10-CM

## 2023-04-19 DIAGNOSIS — I2510 Atherosclerotic heart disease of native coronary artery without angina pectoris: Secondary | ICD-10-CM | POA: Diagnosis not present

## 2023-04-19 MED ORDER — NITROGLYCERIN 0.4 MG SL SUBL
SUBLINGUAL_TABLET | SUBLINGUAL | Status: AC
  Filled 2023-04-19: qty 2

## 2023-04-19 MED ORDER — NITROGLYCERIN 0.4 MG SL SUBL
SUBLINGUAL_TABLET | SUBLINGUAL | Status: AC
  Filled 2023-04-19: qty 1

## 2023-04-19 MED ORDER — NITROGLYCERIN 0.4 MG SL SUBL
0.8000 mg | SUBLINGUAL_TABLET | SUBLINGUAL | Status: DC | PRN
  Administered 2023-04-19: 0.8 mg via SUBLINGUAL

## 2023-04-19 MED ORDER — IOHEXOL 350 MG/ML SOLN
95.0000 mL | Freq: Once | INTRAVENOUS | Status: AC | PRN
  Administered 2023-04-19: 95 mL via INTRAVENOUS

## 2023-04-20 ENCOUNTER — Encounter: Payer: Self-pay | Admitting: Cardiology

## 2023-04-20 DIAGNOSIS — I2510 Atherosclerotic heart disease of native coronary artery without angina pectoris: Secondary | ICD-10-CM | POA: Insufficient documentation

## 2023-04-21 ENCOUNTER — Telehealth: Payer: Self-pay

## 2023-04-21 ENCOUNTER — Ambulatory Visit: Payer: Medicare Other | Admitting: Physical Therapy

## 2023-04-21 ENCOUNTER — Encounter: Payer: Self-pay | Admitting: Physical Therapy

## 2023-04-21 DIAGNOSIS — M6281 Muscle weakness (generalized): Secondary | ICD-10-CM

## 2023-04-21 DIAGNOSIS — M542 Cervicalgia: Secondary | ICD-10-CM | POA: Diagnosis not present

## 2023-04-21 DIAGNOSIS — E7800 Pure hypercholesterolemia, unspecified: Secondary | ICD-10-CM

## 2023-04-21 DIAGNOSIS — M62838 Other muscle spasm: Secondary | ICD-10-CM

## 2023-04-21 DIAGNOSIS — R293 Abnormal posture: Secondary | ICD-10-CM

## 2023-04-21 NOTE — Therapy (Addendum)
OUTPATIENT PHYSICAL THERAPY TREATMENT     Patient Name: Stephanie [name] MRN: [id number] DOB:Feb 08, 1932, 89 y.o., female Today's Date: 04/21/2023   END OF SESSION:  PT End of Session - 04/21/23 1059     Visit Number 13    Number of Visits 20    Date for PT Re-Evaluation 05/24/23    Authorization Type Medicare & BCBS    Progress Note Due on Visit 20    PT Start Time 1059    PT Stop Time 1158    PT Time Calculation (min) 59 min    Activity Tolerance Patient tolerated treatment well;Patient limited by pain    Behavior During Therapy  [facility] for tasks assessed/performed               Past Medical History:  Diagnosis Date   Allergy    Anemia    past hx    Arthropathy of cervical spine 11/27/2012   Right  Xray at [facility]  On 04/08/2016  AP, lateral, and lateral flexion and extension views of the cervical spine are submitted for evaluation.   No acute fracture identified in the cervical spine.   There is redemonstration of approximately 2 mm of C3 on C4 anterolisthesis in neutral which slightly increases in flexion relative to neutral and extension. Slight C5 on C6 retrolisthesis does not change i   Benign hypertension without congestive heart failure    Benign mole    right hcets mole, bleeds when dried with towel   Bilateral dry eyes    Bladder cancer (HCC) 11-02-2016   CAD (coronary artery disease), native coronary artery 04/2023   Coronary CTA showed a coronary calcium score to 46 with less than 25% left main, 25 to 49% proximal LAD, less than 25% left circumflex stenosis   Cervical spondylosis    Cervicalgia    2 screws in place    Depression with anxiety 11/02/16   prior to husbands death    Dyspnea    Gross hematuria    developed after first hip replacment, led to indicental finding of bladder tumor, bleeding now resolved    H/O total shoulder replacement, left    History of kidney stones    1970's   Left hand weakness    due to reinjury of flxor tendon s/p tendon  surgery march 12-2018   Lumbar stenosis    Malignant tumor of urinary bladder (HCC) 07/2016   Pre-stage I   OA (osteoarthritis)    Parkinson disease dx 2012   neurologist-  dr siddiqui at [facility]   Psoriatic arthritis [facility])     Dr Nickola Major , [place] [facility]    Rupture of flexor tendon of hand 2020   right   Past Surgical History:  Procedure Laterality Date   BIOPSY MASS LEFT FIRST METACARPAL   06/23/2006   benign   CATARACT EXTRACTION W/ INTRAOCULAR LENS  IMPLANT, BILATERAL  1999 and 2003   CERVICAL FUSION  02/2017   c1-c2 ; reports she has 2 screws 2in long in place done at [facility] ; patient exhibits VERY LIMITED NECK ROM   COLONOSCOPY     CYSTOSCOPY W/ RETROGRADES Bilateral 09/22/2016   Procedure: CYSTOSCOPY WITH RETROGRADE PYELOGRAM;  Surgeon: Sebastian Ache, MD;  Location: [facility];  Service: Urology;  Laterality: Bilateral;   D & C HYSTEROSCOPY W/ RESECTION POLYP  07/11/2000   DILATION AND CURETTAGE OF UTERUS  1960's   EXCISION CYST AND DEBRIDEMENT RIGHT WIRST AND REMOVAL FORGEIGN BODY  01/09/2009   FLEXOR TENDON REPAIR Left 12/12/2018   Procedure: REPAIR/TRANSFER FLEXOR DIGITORUM PROFUNDUS OF LEFT SMALL FINGER;  Surgeon: Cindee Salt, MD;  Location: [facility];  Service: Orthopedics;  Laterality: Left;   LAPAROSCOPY  yrs ago   infertility    METACARPOPHALANGEAL JOINT ARTHRODESIS Right 05/25/1996   REVERSE SHOULDER ARTHROPLASTY Left 10/14/2022   Procedure: REVERSE SHOULDER ARTHROPLASTY;  Surgeon: Francena Hanly, MD;  Location: [facility];  Service: Orthopedics;  Laterality: Left;    TENDON REPAIR Left 01/15/2019   Hand   TONSILLECTOMY AND ADENOIDECTOMY  child   TOTAL HIP ARTHROPLASTY Left 07/07/2016   Procedure: LEFT TOTAL HIP ARTHROPLASTY ANTERIOR APPROACH;  Surgeon: Ollen Gross, MD;  Location: [facility];  Service: Orthopedics;  Laterality: Left;   TOTAL HIP ARTHROPLASTY Right 09/28/2017   Procedure: RIGHT TOTAL HIP ARTHROPLASTY ANTERIOR  APPROACH;  Surgeon: Ollen Gross, MD;  Location: [facility];  Service: Orthopedics;  Laterality: Right;   TOTAL KNEE ARTHROPLASTY Right 07/30/2019   Procedure: TOTAL KNEE ARTHROPLASTY;  Surgeon: Ollen Gross, MD;  Location: [facility];  Service: Orthopedics;  Laterality: Right;    TRANSURETHRAL RESECTION OF BLADDER TUMOR N/A 09/22/2016   Procedure: TRANSURETHRAL RESECTION OF BLADDER TUMOR (TURBT);  Surgeon: Sebastian Ache, MD;  Location: [facility];  Service: Urology;  Laterality: N/A;   Patient Active Problem List   Diagnosis Date Noted   CAD (coronary artery disease), native coronary artery 04/20/2023   S/P reverse total shoulder arthroplasty, left 10/14/2022   Preoperative cardiovascular examination 10/04/2022   SOB (shortness of breath) 08/02/2022   Insomnia 08/02/2022   Hyponatremia 08/01/2022   Shoulder pain, left 09/14/2021   History of COVID-19 01/06/2021   Diarrhea 09/03/2020   Vitamin D deficiency 09/03/2020   OA (osteoarthritis) of knee 07/30/2019   Tinea corporis 06/08/2019   Psoriatic arthritis (HCC) 04/02/2019   Anemia 03/13/2018   Hot flashes 03/13/2018   S/P cervical spinal fusion 03/31/2017   Neck pain 12/12/2016   Depression with anxiety 10/17/2016   Bladder cancer (HCC) 10/17/2016   OA (osteoarthritis) of hip 07/07/2016   Spinal stenosis of lumbar region with radiculopathy 04/08/2016   Spondylolisthesis of lumbar region 04/08/2016   Spondylosis of cervical region without myelopathy or radiculopathy 04/08/2016   Preventative health care 01/18/2016   Low back pain 01/18/2016   History of chicken pox    Hyperlipidemia 12/15/2014   Allergic rhinitis 06/25/2014   Allergy to bee sting 06/10/2014   TMJ tenderness 11/16/2013   Other malaise and fatigue 05/07/2013   Arthropathy of cervical spine (HCC) 11/27/2012   Muscle spasms of neck 11/27/2012   Parkinson's disease 05/24/2012   Conjunctivochalasis 03/09/2012   Epiretinal membrane 03/09/2012    Hyperopia with astigmatism and presbyopia 03/09/2012   Pseudophakia 03/09/2012   HTN (hypertension) 08/31/2011   Right knee pain 08/31/2011   Benign hypertensive heart disease without heart failure 05/12/2011    PCP: Bradd Canary, MD   REFERRING PROVIDER: Romero Belling, MD   REFERRING DIAG: Multilevel cervical spondylosis, L neck & trapezial myofascial pain/contracture   THERAPY DIAG:  Cervicalgia  Abnormal posture  Muscle weakness (generalized)  Other muscle spasm  RATIONALE FOR EVALUATION AND TREATMENT: Rehabilitation  ONSET DATE: acute on chronic  NEXT MD VISIT: 05/05/23   SUBJECTIVE:  SUBJECTIVE STATEMENT: Pt notes she can get some relief if she keeps her R shoulder up and back it will alleviate her neck pain for extended periods.  More pain noted this morning, potentially from how she slept last night.   PAIN: Are you having pain? Yes: NPRS scale: 5-6/10 Pain location: L neck and scapula Pain description: constant, shooting up back of neck to head Aggravating factors: any movement of head and neck, dyskinesia from PD meds Relieving factors: sitting still in her recliner  PERTINENT HISTORY:  Chronic neck pain, L shoulder advanced OA s/p reverse TSA on 10/14/22; R TKA 07/2019, B THR 2018 & 2017, posterior C1-2 cervical fusion 2018, rupture of flexor tendon of L hand with failed surgical repair 2020, Parkinson's, psoriatic arthritis, bladder cancer, HTN    PRECAUTIONS: None  HAND DOMINANCE: Right  WEIGHT BEARING RESTRICTIONS: No  FALLS:  Has patient fallen in last 6 months? No  LIVING ENVIRONMENT: Lives with: lives alone Lives in: House/apartment Stairs: Yes: External: 4 steps; on right going up, on left going up, and can reach both Has following equipment at  home: Single point cane, Walker - 2 wheeled, Shower bench, bed side commode, and Grab bars  OCCUPATION: Retired  [employer]: Independent and Leisure: volunteering at [employer], bible study, walking 1/3 mile daily - unable recently due to pain   PATIENT GOALS: "Get rid of this pain so I can go back to my normal activities."   OBJECTIVE: (objective measures completed at initial evaluation unless otherwise dated)  DIAGNOSTIC FINDINGS:  Cervical MRI - results not available, but pt reports she was told she had severe OA  02/07/23 - Cervical spine x-ray: IMPRESSION: Multilevel degenerative change and postoperative change without acute abnormality.  PATIENT SURVEYS:  NDI 26 / 50 = 52.0 % ; 17 / 50 = 34.0 %  COGNITION: Overall cognitive status: Within functional limits for tasks assessed  SENSATION: WFL  POSTURE:  rounded shoulders, forward head, weight shift right, and head laterally shifted to R  PALPATION: Severe increased muscle tension and TTP in L>R UT, LS and cervical paraspinals as well as L periscapular muscles   CERVICAL ROM:   Active ROM eval 04/12/23  Flexion 46 41  Extension 22 * 30 *  Right lateral flexion 16 * 12 *  Left lateral flexion 10 * 20 *  Right rotation 16 * 35 *  Left rotation 18* 25 *   (Blank rows = not tested, * = pain) 02/21/23 - clicking with B rotation  UPPER EXTREMITY ROM:  Active ROM Right  eval Left    eval Right 04/12/23 Left 04/12/23  Shoulder flexion 146 111 156 135  Shoulder extension 50 42 55 38  Shoulder abduction 155 118 178 125  Shoulder adduction      Shoulder internal rotation FIR to sacrum FIR to lateral buttock FIR L5 FIR to lateral buttock  Shoulder external rotation FER T3 FER T1 FER T3 FER T1  Elbow flexion      Elbow extension      Wrist flexion      Wrist extension      Wrist ulnar deviation      Wrist radial deviation      Wrist pronation      Wrist supination       (Blank rows = not tested)  UPPER EXTREMITY MMT:   MMT  Right 03/03/23 Left 03/03/23 Right 04/12/23 Left 04/12/23  Shoulder flexion 4+ 4- 5 4, pain in scapula  Shoulder extension 4+  4+ 5 4+, clicking in neck  Shoulder abduction 4+ 4 5 4, pain in L lat neck  Shoulder adduction      Shoulder internal rotation 4+ 4 4+ 4+  Shoulder external rotation 4 4 4+ 4   (Blank rows = not tested)  CERVICAL SPECIAL TESTS:  Spurling's test: Positive and Distraction test: Positive   TODAY'S TREATMENT:   04/21/23 THERAPEUTIC EXERCISE: to improve flexibility, strength and mobility.  Verbal and tactile cues throughout for technique.  UBE: L1.0 x 4 min (backwards)  Shoulder shrugs 2 x 10 - 1 set each w/o and with IFC estim (Remaining exercises performed with IFC estim) Shoulder rolls x 10 each direction Seated YTB scap retraction + B rows x 10 Seated YTB scap retraction + B lat pulldowns x 10 Seated YTB scap retraction + B shoulder extension to neutral x 10 Seated YTB scap retraction + B shoulder ER x 10 Seated YTB scap retraction + B shoulder horiz ABD (low angle) x 10  MANUAL THERAPY: To promote normalized muscle tension, improved flexibility, improved joint mobility, increased ROM, and reduced pain. STM/DTM, manual TPR to L medial and lateral periscapular muscles, L supraspinatus, L teres group/lats and L UT & LS  MODALITIES: IFC to L shoulder complex, 80-150 Hz, intensity to patient tolerance x 30 min - 15 min during performance of exercises and 15 min with moist heat at end of session to reduce pain and muscle guarding   04/18/23 THERAPEUTIC EXERCISE: to improve flexibility, strength and mobility.  Verbal and tactile cues throughout for technique.  UBE: L1.0 x 4 min  Seated cerv retraction 10x Scap squeeze x 10  Shoulder rolls x 10  Seated shoulder press into beach ball 2x10 Seated isometric IR 2x10   MANUAL THERAPY: To promote normalized muscle tension, improved flexibility, improved joint mobility, increased ROM, and reduced pain. STM/DTM, manual TPR  to L periscapular muscles,  L UT, LS  Estim: IFC 80-150 Hz intensity to tolerance x 10 min   04/14/23 THERAPEUTIC EXERCISE: to improve flexibility, strength and mobility.  Verbal and tactile cues throughout for technique.  UBE: L1.0 x 4 min (backwards)  S/L L scapular retraction and depression 10 x 3", 2 sets S/L bow and arrow open book stretch keeping elbow bent and rotating head with trunk rotation x 10  MANUAL THERAPY: To promote normalized muscle tension, improved flexibility, improved joint mobility, increased ROM, and reduced pain. STM/DTM, manual TPR to L periscapular muscles, esp L UT, LS, rhomboids, middle traps and subscapularis in side-lying L subscapular release in side-lying L scapular mobs in side-lying - emphasis on medial glide, retraction and depression MWM through pain-free cervical extension, lateral flexion and rotation in supine Skilled palpation and monitoring of soft tissue during DN Trigger Point Dry-Needling  Treatment instructions: Expect mild to moderate muscle soreness. S/S of pneumothorax if dry needled over a lung field, and to seek immediate medical attention should they occur. Patient verbalized understanding of these instructions and education. Patient Consent Given: Yes Education handout provided: Previously provided Muscles treated: L SCM Electrical stimulation performed: No Parameters: N/A Treatment response/outcome: Twitch Response Elicited and Palpable Increase in Muscle Length STM/DTM, XFM and pin & stretch to muscles to L SCM in supine  MODALITIES/SELF CARE: Instruction in set up and use of patient's daughter's home TENS unit   PATIENT EDUCATION:  Education details:  Instruction in set-up and use of home TENS unit    Person educated: Patient Education method: Explanation, Demonstration, and Verbal cues Education comprehension:  verbalized understanding  HOME EXERCISE PROGRAM: Access Code: [email or web address] URL:  [email or web address] Date: 03/21/2023 Prepared by: Raynelle Fanning  Exercises - Seated Cervical Retraction  - 2-3 x daily - 7 x weekly - 2 sets - 10 reps - 3-5 sec hold - Shoulder Rolls in Sitting  - 2-3 x daily - 7 x weekly - 2 sets - 10 reps - 3 sec hold - Seated Scapular Retraction  - 2-3 x daily - 7 x weekly - 2 sets - 10 reps - 5 sec hold - Sternocleidomastoid Stretch  - 2-3 x daily - 7 x weekly - 3 reps - 30 sec hold - Standing Bilateral Low Shoulder Row with Anchored Resistance  - 1 x daily - 3-4 x weekly - 2 sets - 10 reps - 5 sec hold - Scapular Retraction with Resistance Advanced  - 1 x daily - 3-4 x weekly - 2 sets - 10 reps - 5 sec hold - Seated Shoulder Flexion Full Range (Mirrored)  - 1 x daily - 3-4 x weekly - 2 sets - 10 reps - 3 sec hold - Seated Single Arm Shoulder Abduction - Thumb Up  - 1 x daily - 3-4 x weekly - 2 sets - 10 reps - 3 sec hold - Shoulder Internal Rotation with Resistance (Mirrored)  - 1 x daily - 3 x weekly - 1-2 sets - 10 reps - 3 sec hold - Shoulder External Rotation with Anchored Resistance  - 1 x daily - 3 x weekly - 1-2 sets - 10 reps - 3 sec hold - Standing Isometric Cervical Sidebending with Manual Resistance  - 1 x daily - 7 x weekly - 1 sets - 5 reps - 10 sec hold hold - Standing Isometric Cervical Flexion with Manual Resistance  - 1 x daily - 7 x weekly - 1 sets - 5 reps - 10 sec hold - Standing Isometric Cervical Extension with Manual Resistance  - 1 x daily - 7 x weekly - 1 sets - 5 reps - 10 sec hold - Seated Isometric Cervical Rotation  - 1 x daily - 7 x weekly - 1 sets - 5 reps - 10 sec hold   ASSESSMENT:  CLINICAL IMPRESSION: Stephanie [name] notes she was able to achieve some lasting relief of her neck pain yesterday with conscious R shoulder/scapular retraction and slight elevation, but when she tried the sam muscle activation on the L it increases her pain. She continues to have several active TPs t/o her L shoulder complex both in the RTC  muscles as well as the upper shoulder and periscapular muscles. These were addressed with MT and utilization of IFC estim during attempts at exercise performance. Liddy reported better tolerance for/ability to complete scapular activation exercises with estim running. Encouraged her to try performing her HEP exercises while wearing her daughter's TENS unit at home. Session concluded with additional time on IFC and moist heat to promote further muscle relaxation and reduce post-exercise soreness. Ebru will benefit from skilled PT to address pain, ROM and strength deficits to improve mobility and activity tolerance with decreased pain interference.   OBJECTIVE IMPAIRMENTS: decreased activity tolerance, decreased endurance, decreased knowledge of condition, decreased mobility, difficulty walking, decreased ROM, decreased strength, hypomobility, increased fascial restrictions, impaired perceived functional ability, increased muscle spasms, impaired flexibility, impaired UE functional use, improper body mechanics, postural dysfunction, and pain.   ACTIVITY LIMITATIONS: carrying, lifting, standing, sleeping, transfers, bed mobility, bathing, toileting, dressing, reach over head, and hygiene/grooming  PARTICIPATION LIMITATIONS: meal prep, cleaning,  laundry, driving, shopping, community activity, occupation, yard work, and church  PERSONAL FACTORS: Age, Fitness, Past/current experiences, Time since onset of injury/illness/exacerbation, and 3+ comorbidities: Chronic neck pain, L shoulder advanced OA s/p reverse TSA on 10/14/22; R TKA 07/2019, B THR 2018 & 2017, posterior C1-2 cervical fusion 2018, rupture of flexor tendon of L hand with failed surgical repair 2020, Parkinson's, psoriatic arthritis, bladder cancer, HTN    are also affecting patient's functional outcome.   REHAB POTENTIAL: Good  CLINICAL DECISION MAKING: Evolving/moderate complexity  EVALUATION COMPLEXITY: Moderate   GOALS: Goals reviewed  with patient? Yes  SHORT TERM GOALS: Target date: 03/21/2023, extended to 05/03/2023   Patient will be independent with initial HEP to improve outcomes and carryover.  Baseline: Established 03/03/23 Goal status: MET  03/31/23  2.  Patient will report 25% improvement in neck pain to improve QOL.   Baseline: 8-10/10 Goal status: PARTIALLY MET  04/12/23 - minimal to no change in pain per pt report; pain ratings averaging 5-6/10 in recent visits as compared to 8-10/10 at eval  LONG TERM GOALS: Target date: 04/18/2023, extended to 05/24/2023   Patient will be independent with ongoing/advanced HEP for self-management at home.  Baseline:  Goal status: PARTIALLY MET  04/12/23 - Met for current HEP  2.  Patient will demonstrate improved posture to decrease muscle imbalance. Baseline: rounded shoulders, forward head, weight shift right, and head laterally shifted to R Goal status: IN PROGRESS  03/31/23 - still with significant forward head and flexed cervical and upper thoracic posture  3.  Patient will report 50-75% improvement in neck pain to improve QOL.  Baseline: 8-10/10 Goal status: IN PROGRESS  04/12/23 - minimal to no change in pain per pt report; pain ratings averaging 5-6/10 in recent visits as compared to 8-10/10 at eval  4.  Patient will demonstrate functional pain free cervical ROM for safety with driving.  Baseline: refer to above cervical ROM table Goal status: IN PROGRESS  04/12/23 - ROM improving in most planes but still painful for al directions except flexion   5.  Patient will report </= 37% on NDI to demonstrate improved functional ability.  Baseline: 26 / 50 = 52.0 % Goal status: MET  04/12/23 - 17 / 50 = 34.0 %  6  Patient will report >/= 50% improvement in her sleep due to decreased neck pain. Baseline: Sleep greatly disturbed for up to 3-5 hrs due to neck pain per NDI Goal status: PARTIALLY MET  04/12/23 - Sleep mildly disturbed  for up to 1-2 hrs per NDI  7.  Patient will be  able to resume attending church services w/o limitation due to neck pain. Baseline: Has been unable to attend church Goal status: IN PROGRESS  04/12/23 - She has not been able to return to church d/t limited unsupported sitting tolerance as well as increased pain as she tries to turn her head to observe the services  8. Patient will be able to resume walking for exercise w/o limitation due to neck pain. Baseline: Has been unable to walk d/t pain Goal status: IN PROGRESS  04/12/23 - She has attempting walking for exercise but distance still limited d/t neck pain   PLAN:  PT FREQUENCY: 2x/week  PT DURATION: 8 weeks  PLANNED INTERVENTIONS: Therapeutic exercises, Therapeutic activity, Neuromuscular re-education, Balance training, Patient/Family education, Self Care, Joint mobilization, Dry Needling, Electrical stimulation, Spinal mobilization, Cryotherapy, Moist heat, Taping, Ultrasound, Ionotophoresis 4mg /ml Dexamethasone, Manual therapy, and Re-evaluation  PLAN FOR NEXT SESSION: re-assess STG #  2 as well as LTGs as appropriate; gently progress isometrics and cervical flexibility and ROM, postural correction and strengthening; review and progress HEP as able; MT +/- DN as indicated and benefit noted   Marry Guan, PT 04/21/2023, 12:13 PM

## 2023-04-21 NOTE — Telephone Encounter (Signed)
Called patient to discuss results of coronary CTA. Patient verbalizes understanding that Coronary CTA showed a coronary calcium score to 46 with less than 25% left main, 25 to 49% proximal LAD, less than 25% left circumflex stenosis and calcifications of the aortic valve. Explained taht her LDL goal is less than 70 and her last LDL in April was 78. Patient agrees to start aspirin 81 mg daily but declines to start atorvastatin 10 mg daily. She states she has seen many people fail on that medication due to joint and muscle aches. Due to her parkinson's disease and arthritis, she feels she "can't take any more pain". She is asking if there is any alternative medication Dr. Mayford Knife feels would make enough of a difference in her lab work to get her to her goal. Forwarded to Dr. Mayford Knife.

## 2023-04-21 NOTE — Telephone Encounter (Signed)
-----   Message from Quintella Reichert, MD sent at 04/20/2023  8:07 AM EDT ----- Coronary CTA showed a coronary calcium score to 46 with less than 25% left main, 25 to 49% proximal LAD, less than 25% left circumflex stenosis and calcifications of the aortic valve (2D echo showed no significant AV calcifications).  Her LDL goal is less than 70 and her last LDL in April was 78 so not at goal.  Start aspirin 81 mg daily as well as atorvastatin 10 mg daily and repeat FLP and ALT in 6 weeks

## 2023-04-22 NOTE — Telephone Encounter (Signed)
Called patient to discuss Dr. Norris Cross recommendation that patient be referred to lipid clinic to reduce risks of cardiac disease with medications that have tolerable side effects. Patient verbalizes understanding and agrees to plan.

## 2023-04-22 NOTE — Addendum Note (Signed)
Addended by: Luellen Pucker on: 04/22/2023 09:28 AM   Modules accepted: Orders

## 2023-04-25 ENCOUNTER — Ambulatory Visit: Payer: Medicare Other | Admitting: Physical Therapy

## 2023-04-25 ENCOUNTER — Encounter: Payer: Self-pay | Admitting: Physical Therapy

## 2023-04-25 DIAGNOSIS — M6281 Muscle weakness (generalized): Secondary | ICD-10-CM | POA: Diagnosis not present

## 2023-04-25 DIAGNOSIS — M62838 Other muscle spasm: Secondary | ICD-10-CM | POA: Diagnosis not present

## 2023-04-25 DIAGNOSIS — R293 Abnormal posture: Secondary | ICD-10-CM | POA: Diagnosis not present

## 2023-04-25 DIAGNOSIS — M542 Cervicalgia: Secondary | ICD-10-CM | POA: Diagnosis not present

## 2023-04-25 NOTE — Therapy (Signed)
OUTPATIENT PHYSICAL THERAPY TREATMENT     Patient Name: Stephanie [name] MRN: [id number] DOB:[birth year], 89 y.o., female Today's Date: 04/25/2023   END OF SESSION:  PT End of Session - 04/25/23 1536     Visit Number 14    Number of Visits 20    Date for PT Re-Evaluation 05/24/23    Authorization Type Medicare & BCBS    Progress Note Due on Visit 20    PT Start Time 1536    PT Stop Time 1620    PT Time Calculation (min) 44 min    Activity Tolerance Patient tolerated treatment well                Past Medical History:  Diagnosis Date   Allergy    Anemia    past hx    Arthropathy of cervical spine 11/27/2012   Right  Xray at [facility]  On 04/08/2016  AP, lateral, and lateral flexion and extension views of the cervical spine are submitted for evaluation.   No acute fracture identified in the cervical spine.   There is redemonstration of approximately 2 mm of C3 on C4 anterolisthesis in neutral which slightly increases in flexion relative to neutral and extension. Slight C5 on C6 retrolisthesis does not change i   Benign hypertension without congestive heart failure    Benign mole    right hcets mole, bleeds when dried with towel   Bilateral dry eyes    Bladder cancer (HCC) 2016-11-16   CAD (coronary artery disease), native coronary artery 04/2023   Coronary CTA showed a coronary calcium score to 46 with less than 25% left main, 25 to 49% proximal LAD, less than 25% left circumflex stenosis   Cervical spondylosis    Cervicalgia    2 screws in place    Depression with anxiety 11-16-16   prior to husbands death    Dyspnea    Gross hematuria    developed after first hip replacment, led to indicental finding of bladder tumor, bleeding now resolved    H/O total shoulder replacement, left    History of kidney stones    1970's   Left hand weakness    due to reinjury of flxor tendon s/p tendon surgery march 12-2018   Lumbar stenosis    Malignant tumor of urinary bladder (HCC)  07/2016   Pre-stage I   OA (osteoarthritis)    Parkinson disease dx 2012   neurologist-  dr siddiqui at [facility]   Psoriatic arthritis [facility])     Dr Nickola Major , [place] [facility]    Rupture of flexor tendon of hand 2020   right   Past Surgical History:  Procedure Laterality Date   BIOPSY MASS LEFT FIRST METACARPAL   06/23/2006   benign   CATARACT EXTRACTION W/ INTRAOCULAR LENS  IMPLANT, BILATERAL  1999 and 2003   CERVICAL FUSION  02/2017   c1-c2 ; reports she has 2 screws 2in long in place done at [facility] ; patient exhibits VERY LIMITED NECK ROM   COLONOSCOPY     CYSTOSCOPY W/ RETROGRADES Bilateral 09/22/2016   Procedure: CYSTOSCOPY WITH RETROGRADE PYELOGRAM;  Surgeon: Sebastian Ache, MD;  Location: [facility];  Service: Urology;  Laterality: Bilateral;   D & C HYSTEROSCOPY W/ RESECTION POLYP  07/11/2000   DILATION AND CURETTAGE OF UTERUS  1960's   EXCISION CYST AND DEBRIDEMENT RIGHT WIRST AND REMOVAL FORGEIGN BODY  01/09/2009   FLEXOR TENDON REPAIR Left 12/12/2018   Procedure: REPAIR/TRANSFER FLEXOR  DIGITORUM PROFUNDUS OF LEFT SMALL FINGER;  Surgeon: Cindee Salt, MD;  Location: [facility];  Service: Orthopedics;  Laterality: Left;   LAPAROSCOPY  yrs ago   infertility    METACARPOPHALANGEAL JOINT ARTHRODESIS Right 05/25/1996   REVERSE SHOULDER ARTHROPLASTY Left 10/14/2022   Procedure: REVERSE SHOULDER ARTHROPLASTY;  Surgeon: Francena Hanly, MD;  Location: [facility];  Service: Orthopedics;  Laterality: Left;    TENDON REPAIR Left 01/15/2019   Hand   TONSILLECTOMY AND ADENOIDECTOMY  child   TOTAL HIP ARTHROPLASTY Left 07/07/2016   Procedure: LEFT TOTAL HIP ARTHROPLASTY ANTERIOR APPROACH;  Surgeon: Ollen Gross, MD;  Location: [facility];  Service: Orthopedics;  Laterality: Left;   TOTAL HIP ARTHROPLASTY Right 09/28/2017   Procedure: RIGHT TOTAL HIP ARTHROPLASTY ANTERIOR APPROACH;  Surgeon: Ollen Gross, MD;  Location: [facility];  Service: Orthopedics;   Laterality: Right;   TOTAL KNEE ARTHROPLASTY Right 07/30/2019   Procedure: TOTAL KNEE ARTHROPLASTY;  Surgeon: Ollen Gross, MD;  Location: [facility];  Service: Orthopedics;  Laterality: Right;    TRANSURETHRAL RESECTION OF BLADDER TUMOR N/A 09/22/2016   Procedure: TRANSURETHRAL RESECTION OF BLADDER TUMOR (TURBT);  Surgeon: Sebastian Ache, MD;  Location: [facility];  Service: Urology;  Laterality: N/A;   Patient Active Problem List   Diagnosis Date Noted   CAD (coronary artery disease), native coronary artery 04/20/2023   S/P reverse total shoulder arthroplasty, left 10/14/2022   Preoperative cardiovascular examination 10/04/2022   SOB (shortness of breath) 08/02/2022   Insomnia 08/02/2022   Hyponatremia 08/01/2022   Shoulder pain, left 09/14/2021   History of COVID-19 01/06/2021   Diarrhea 09/03/2020   Vitamin D deficiency 09/03/2020   OA (osteoarthritis) of knee 07/30/2019   Tinea corporis 06/08/2019   Psoriatic arthritis (HCC) 04/02/2019   Anemia 03/13/2018   Hot flashes 03/13/2018   S/P cervical spinal fusion 03/31/2017   Neck pain 12/12/2016   Depression with anxiety 10/17/2016   Bladder cancer (HCC) 10/17/2016   OA (osteoarthritis) of hip 07/07/2016   Spinal stenosis of lumbar region with radiculopathy 04/08/2016   Spondylolisthesis of lumbar region 04/08/2016   Spondylosis of cervical region without myelopathy or radiculopathy 04/08/2016   Preventative health care 01/18/2016   Low back pain 01/18/2016   History of chicken pox    Hyperlipidemia 12/15/2014   Allergic rhinitis 06/25/2014   Allergy to bee sting 06/10/2014   TMJ tenderness 11/16/2013   Other malaise and fatigue 05/07/2013   Arthropathy of cervical spine (HCC) 11/27/2012   Muscle spasms of neck 11/27/2012   Parkinson's disease 05/24/2012   Conjunctivochalasis 03/09/2012   Epiretinal membrane 03/09/2012   Hyperopia with astigmatism and presbyopia 03/09/2012   Pseudophakia 03/09/2012    HTN (hypertension) 08/31/2011   Right knee pain 08/31/2011   Benign hypertensive heart disease without heart failure 05/12/2011    PCP: Bradd Canary, MD   REFERRING PROVIDER: Romero Belling, MD   REFERRING DIAG: Multilevel cervical spondylosis, L neck & trapezial myofascial pain/contracture   THERAPY DIAG:  Cervicalgia  Abnormal posture  Muscle weakness (generalized)  Other muscle spasm  RATIONALE FOR EVALUATION AND TREATMENT: Rehabilitation  ONSET DATE: acute on chronic  NEXT MD VISIT: 05/05/23   SUBJECTIVE:  SUBJECTIVE STATEMENT: Pt reports she woke up w/o pain this morning but within 10 minutes of getting up the pain returned.  PAIN: Are you having pain? Yes: NPRS scale: 3-4,with movement up to 6/10 Pain location: L neck and scapula Pain description: constant, shooting up back of neck to head Aggravating factors: any movement of head and neck, dyskinesia from PD meds Relieving factors: sitting still in her recliner  PERTINENT HISTORY:  Chronic neck pain, L shoulder advanced OA s/p reverse TSA on 10/14/22; R TKA 07/2019, B THR 2018 & 2017, posterior C1-2 cervical fusion 2018, rupture of flexor tendon of L hand with failed surgical repair 2020, Parkinson's, psoriatic arthritis, bladder cancer, HTN    PRECAUTIONS: None  HAND DOMINANCE: Right  WEIGHT BEARING RESTRICTIONS: No  FALLS:  Has patient fallen in last 6 months? No  LIVING ENVIRONMENT: Lives with: lives alone Lives in: House/apartment Stairs: Yes: External: 4 steps; on right going up, on left going up, and can reach both Has following equipment at home: Single point cane, Walker - 2 wheeled, Shower bench, bed side commode, and Grab bars  OCCUPATION: Retired  [employer]: Independent and Leisure: volunteering at  [employer], bible study, walking 1/3 mile daily - unable recently due to pain   PATIENT GOALS: "Get rid of this pain so I can go back to my normal activities."   OBJECTIVE: (objective measures completed at initial evaluation unless otherwise dated)  DIAGNOSTIC FINDINGS:  Cervical MRI - results not available, but pt reports she was told she had severe OA  02/07/23 - Cervical spine x-ray: IMPRESSION: Multilevel degenerative change and postoperative change without acute abnormality.  PATIENT SURVEYS:  NDI 26 / 50 = 52.0 % ; 17 / 50 = 34.0 %  COGNITION: Overall cognitive status: Within functional limits for tasks assessed  SENSATION: WFL  POSTURE:  rounded shoulders, forward head, weight shift right, and head laterally shifted to R  PALPATION: Severe increased muscle tension and TTP in L>R UT, LS and cervical paraspinals as well as L periscapular muscles   CERVICAL ROM:   Active ROM eval 04/12/23  Flexion 46 41  Extension 22 * 30 *  Right lateral flexion 16 * 12 *  Left lateral flexion 10 * 20 *  Right rotation 16 * 35 *  Left rotation 18* 25 *   (Blank rows = not tested, * = pain) 02/21/23 - clicking with B rotation  UPPER EXTREMITY ROM:  Active ROM Right  eval Left    eval Right 04/12/23 Left 04/12/23  Shoulder flexion 146 111 156 135  Shoulder extension 50 42 55 38  Shoulder abduction 155 118 178 125  Shoulder adduction      Shoulder internal rotation FIR to sacrum FIR to lateral buttock FIR L5 FIR to lateral buttock  Shoulder external rotation FER T3 FER T1 FER T3 FER T1  Elbow flexion      Elbow extension      Wrist flexion      Wrist extension      Wrist ulnar deviation      Wrist radial deviation      Wrist pronation      Wrist supination       (Blank rows = not tested)  UPPER EXTREMITY MMT:   MMT Right 03/03/23 Left 03/03/23 Right 04/12/23 Left 04/12/23  Shoulder flexion 4+ 4- 5 4, pain in scapula  Shoulder extension 4+ 4+ 5 4+, clicking in neck  Shoulder  abduction 4+ 4 5 4, pain in L  lat neck  Shoulder adduction      Shoulder internal rotation 4+ 4 4+ 4+  Shoulder external rotation 4 4 4+ 4   (Blank rows = not tested)  CERVICAL SPECIAL TESTS:  Spurling's test: Positive and Distraction test: Positive   TODAY'S TREATMENT:   04/25/23 THERAPEUTIC EXERCISE: to improve flexibility, strength and mobility.  Verbal and tactile cues throughout for technique.  UBE: L1.0 x 5 min (backwards)  Seated thoracic extension over back of chair with arms folded over chest x 10, with hands behind neck for pec stretch x 10 (Airex pad on seat of chair to raise her up and feet resting on 4" step) Seated scap retraction into pool noodle on back of chair 10 x 3-5" Seated YTB scap retraction into pool noodle + B shoulder horiz ABD x 10 Seated YTB scap retraction into pool noodle + B shoulder horiz ABD diagonals x 10 Standing leaning over orange Pball on mat table: Cervical retraction + thoracic extension + B bent arm row x 10 Cervical retraction + thoracic extension + B shoulder extension x 10  MANUAL THERAPY: To promote normalized muscle tension, improved flexibility, improved joint mobility, increased ROM, and reduced pain.  STM/DTM, manual TPR to L UT, LS and medial periscapular muscles   04/21/23 THERAPEUTIC EXERCISE: to improve flexibility, strength and mobility.  Verbal and tactile cues throughout for technique.  UBE: L1.0 x 4 min (backwards)  Shoulder shrugs 2 x 10 - 1 set each w/o and with IFC estim (Remaining exercises performed with IFC estim) Shoulder rolls x 10 each direction Seated YTB scap retraction + B rows x 10 Seated YTB scap retraction + B lat pulldowns x 10 Seated YTB scap retraction + B shoulder extension to neutral x 10 Seated YTB scap retraction + B shoulder ER x 10 Seated YTB scap retraction + B shoulder horiz ABD (low angle) x 10  MANUAL THERAPY: To promote normalized muscle tension, improved flexibility, improved joint mobility,  increased ROM, and reduced pain. STM/DTM, manual TPR to L medial and lateral periscapular muscles, L supraspinatus, L teres group/lats and L UT & LS  MODALITIES: IFC to L shoulder complex, 80-150 Hz, intensity to patient tolerance x 30 min - 15 min during performance of exercises and 15 min with moist heat at end of session to reduce pain and muscle guarding   04/18/23 THERAPEUTIC EXERCISE: to improve flexibility, strength and mobility.  Verbal and tactile cues throughout for technique.  UBE: L1.0 x 4 min  Seated cerv retraction 10x Scap squeeze x 10  Shoulder rolls x 10  Seated shoulder press into beach ball 2x10 Seated isometric IR 2x10   MANUAL THERAPY: To promote normalized muscle tension, improved flexibility, improved joint mobility, increased ROM, and reduced pain. STM/DTM, manual TPR to L periscapular muscles,  L UT, LS  Estim: IFC 80-150 Hz intensity to tolerance x 10 min   PATIENT EDUCATION:  Education details:  Instruction in set-up and use of home TENS unit    Person educated: Patient Education method: Explanation, Demonstration, and Verbal cues Education comprehension: verbalized understanding  HOME EXERCISE PROGRAM: Access Code: [email or web address] URL: [email or web address] Date: 03/21/2023 Prepared by: Raynelle Fanning  Exercises - Seated Cervical Retraction  - 2-3 x daily - 7 x weekly - 2 sets - 10 reps - 3-5 sec hold - Shoulder Rolls in Sitting  - 2-3 x daily - 7 x weekly - 2 sets - 10 reps - 3 sec hold - Seated Scapular Retraction  - 2-3 x daily -  7 x weekly - 2 sets - 10 reps - 5 sec hold - Sternocleidomastoid Stretch  - 2-3 x daily - 7 x weekly - 3 reps - 30 sec hold - Standing Bilateral Low Shoulder Row with Anchored Resistance  - 1 x daily - 3-4 x weekly - 2 sets - 10 reps - 5 sec hold - Scapular Retraction with Resistance Advanced  - 1 x daily - 3-4 x weekly - 2 sets - 10 reps - 5 sec hold - Seated Shoulder Flexion Full Range (Mirrored)  - 1 x daily - 3-4 x  weekly - 2 sets - 10 reps - 3 sec hold - Seated Single Arm Shoulder Abduction - Thumb Up  - 1 x daily - 3-4 x weekly - 2 sets - 10 reps - 3 sec hold - Shoulder Internal Rotation with Resistance (Mirrored)  - 1 x daily - 3 x weekly - 1-2 sets - 10 reps - 3 sec hold - Shoulder External Rotation with Anchored Resistance  - 1 x daily - 3 x weekly - 1-2 sets - 10 reps - 3 sec hold - Standing Isometric Cervical Sidebending with Manual Resistance  - 1 x daily - 7 x weekly - 1 sets - 5 reps - 10 sec hold hold - Standing Isometric Cervical Flexion with Manual Resistance  - 1 x daily - 7 x weekly - 1 sets - 5 reps - 10 sec hold - Standing Isometric Cervical Extension with Manual Resistance  - 1 x daily - 7 x weekly - 1 sets - 5 reps - 10 sec hold - Seated Isometric Cervical Rotation  - 1 x daily - 7 x weekly - 1 sets - 5 reps - 10 sec hold   ASSESSMENT:  CLINICAL IMPRESSION: Keanu has difficulty appreciating significant change in pain, however average pain ratings now varying from 3-4/10 to 5-6/10 most days versus 8-10/10 at eval. She is frustrated by continued daily limitations due to pain including inability to attend church services, but has been able to walk for exercise on some occasions without limitation due to pain although she admits to limiting distance so as not to be caught away from home if pain were to occur.  Progressed exercises with emphasis on thoracic extension and scapular retraction with good tolerance for most exercises today.  HEP updated to include exercise progression with patient cautioned not to force motions of pain were to present.  She denied need for e-stim today she has been able to use her daughter's TENS unit at home with good success. Xitlalli will continue to benefit from skilled PT to address pain, abnormal muscle tension, ROM and strength deficits to improve mobility and activity tolerance with decreased pain interference.   OBJECTIVE IMPAIRMENTS: decreased activity  tolerance, decreased endurance, decreased knowledge of condition, decreased mobility, difficulty walking, decreased ROM, decreased strength, hypomobility, increased fascial restrictions, impaired perceived functional ability, increased muscle spasms, impaired flexibility, impaired UE functional use, improper body mechanics, postural dysfunction, and pain.   ACTIVITY LIMITATIONS: carrying, lifting, standing, sleeping, transfers, bed mobility, bathing, toileting, dressing, reach over head, and hygiene/grooming  PARTICIPATION LIMITATIONS: meal prep, cleaning, laundry, driving, shopping, community activity, occupation, yard work, and church  PERSONAL FACTORS: Age, Fitness, Past/current experiences, Time since onset of injury/illness/exacerbation, and 3+ comorbidities: Chronic neck pain, L shoulder advanced OA s/p reverse TSA on 10/14/22; R TKA 07/2019, B THR 2018 & 2017, posterior C1-2 cervical fusion 2018, rupture of flexor tendon of L hand with failed surgical repair 2020,  Parkinson's, psoriatic arthritis, bladder cancer, HTN    are also affecting patient's functional outcome.   REHAB POTENTIAL: Good  CLINICAL DECISION MAKING: Evolving/moderate complexity  EVALUATION COMPLEXITY: Moderate   GOALS: Goals reviewed with patient? Yes  SHORT TERM GOALS: Target date: 03/21/2023, extended to 05/03/2023   Patient will be independent with initial HEP to improve outcomes and carryover.  Baseline: Established 03/03/23 Goal status: MET  03/31/23  2.  Patient will report 25% improvement in neck pain to improve QOL.   Baseline: 8-10/10 Goal status: PARTIALLY MET  04/25/23 - minimal to no change in pain per pt report; pain ratings averaging from 3-4/10 up to 5-6/10 in recent visits as compared to 8-10/10 at eval  LONG TERM GOALS: Target date: 04/18/2023, extended to 05/24/2023   Patient will be independent with ongoing/advanced HEP for self-management at home.  Baseline:  Goal status: PARTIALLY MET  04/25/23 - Met  for current HEP  2.  Patient will demonstrate improved posture to decrease muscle imbalance. Baseline: rounded shoulders, forward head, weight shift right, and head laterally shifted to R Goal status: IN PROGRESS  04/25/23 - still with significant forward head and flexed cervical and upper thoracic posture  3.  Patient will report 50-75% improvement in neck pain to improve QOL.  Baseline: 8-10/10 Goal status: IN PROGRESS  04/12/23 - minimal to no change in pain per pt report; pain ratings averaging 5-6/10 in recent visits as compared to 8-10/10 at eval  4.  Patient will demonstrate functional pain free cervical ROM for safety with driving.  Baseline: refer to above cervical ROM table Goal status: IN PROGRESS  04/12/23 - ROM improving in most planes but still painful for al directions except flexion   5.  Patient will report </= 37% on NDI to demonstrate improved functional ability.  Baseline: 26 / 50 = 52.0 % Goal status: MET  04/12/23 - 17 / 50 = 34.0 %  6  Patient will report >/= 50% improvement in her sleep due to decreased neck pain. Baseline: Sleep greatly disturbed for up to 3-5 hrs due to neck pain per NDI Goal status: PARTIALLY MET  04/12/23 - Sleep mildly disturbed  for up to 1-2 hrs per NDI  7.  Patient will be able to resume attending church services w/o limitation due to neck pain. Baseline: Has been unable to attend church Goal status: IN PROGRESS  04/25/23 - She has not been able to return to church d/t limited unsupported sitting tolerance as well as increased pain as she tries to turn her head to observe the services  8. Patient will be able to resume walking for exercise w/o limitation due to neck pain. Baseline: Has been unable to walk d/t pain Goal status: IN PROGRESS  04/25/23 - She has attempting walking for exercise but tolerance remains variable d/t neck pain   PLAN:  PT FREQUENCY: 2x/week  PT DURATION: 8 weeks  PLANNED INTERVENTIONS: Therapeutic exercises,  Therapeutic activity, Neuromuscular re-education, Balance training, Patient/Family education, Self Care, Joint mobilization, Dry Needling, Electrical stimulation, Spinal mobilization, Cryotherapy, Moist heat, Taping, Ultrasound, Ionotophoresis 4mg /ml Dexamethasone, Manual therapy, and Re-evaluation  PLAN FOR NEXT SESSION: gently progress isometrics, cervical flexibility and ROM, postural correction and strengthening - thoracic extension and scapular retraction as tolerated; review and progress HEP as able; MT +/- DN as indicated and benefit noted   Marry Guan, PT 04/25/2023, 5:22 PM

## 2023-04-27 ENCOUNTER — Ambulatory Visit: Payer: Medicare Other | Admitting: Physical Therapy

## 2023-04-27 ENCOUNTER — Encounter: Payer: Self-pay | Admitting: Physical Therapy

## 2023-04-27 DIAGNOSIS — R293 Abnormal posture: Secondary | ICD-10-CM | POA: Diagnosis not present

## 2023-04-27 DIAGNOSIS — M542 Cervicalgia: Secondary | ICD-10-CM | POA: Diagnosis not present

## 2023-04-27 DIAGNOSIS — M62838 Other muscle spasm: Secondary | ICD-10-CM | POA: Diagnosis not present

## 2023-04-27 DIAGNOSIS — M6281 Muscle weakness (generalized): Secondary | ICD-10-CM | POA: Diagnosis not present

## 2023-04-27 NOTE — Progress Notes (Signed)
Patient ID: Stephanie [name]                 DOB: 01-29-1932                    MRN: [id number]      HPI: Stephanie [name] is a 89 y.o. female patient referred to lipid clinic by Mayford Knife. PMH is significant for CAC sore 246 HTN, psoriatic arthritis, HDL, parkinson's, osteoarthritis, spondylolisthesis of [place] region.   Patient presented today for lipid clinic. Patient reports due to lots of joint related conditions she is in lots of pain. She currently go for PT twice week but its not helping for her neck or upper back pain. She loves to go for walks outside but due to chronic pain she is unable to stand and walk very far. Around christmas time lost appetite and lost 10 lbs. She use to volunteer at church but due back pain not been able to go to church. Over time her pain is worsening. Dr.turner offer her to be on statin but she is not willing to go on any statin. He husband use to be on one and he was getting lots of joint/muscle pain from it. We discussed different types of statins and how higher dose can leads to more side effects . Also we discussed Zetia. Its potential side effects and its lipid lowering effect and primary prevention benefit for each option.     Current Medications: none   Risk Factors: CAC sore 246 HTN, psoriatic arthritis, HDL,spondylolisthesis of [place] region LDL goal: <70 mg/dl   Diet: does not eat fried food Eat lots of fish, lots of greens, mainly eats at home   Exercise: go to PT 2 times per week for neck pain   Social History:  Alcohol: 5 drinks per week not always but that is max per week. Smoking: quit 60 years ago    Labs: Lipid Panel     Component Value Date/Time   CHOL 155 02/28/2023 1056   CHOL 173 12/31/2019 0852   TRIG 103.0 02/28/2023 1056   HDL 56.50 02/28/2023 1056   HDL 58 12/31/2019 0852   CHOLHDL 3 02/28/2023 1056   VLDL 20.6 02/28/2023 1056   LDLCALC 78 02/28/2023 1056   LDLCALC 113 (H) 09/02/2020 1600   LDLDIRECT 120.0 12/11/2014 1040    LABVLDL 16 12/31/2019 0852    Past Medical History:  Diagnosis Date   Allergy    Anemia    past hx    Arthropathy of cervical spine 11/27/2012   Right  [facility] at [facility]  On 04/08/2016  AP, lateral, and lateral flexion and extension views of the cervical spine are submitted for evaluation.   No acute fracture identified in the cervical spine.   There is redemonstration of approximately 2 mm of C3 on C4 anterolisthesis in neutral which slightly increases in flexion relative to neutral and extension. Slight C5 on C6 retrolisthesis does not change i   Benign hypertension without congestive heart failure    Benign mole    right hcets mole, bleeds when dried with towel   Bilateral dry eyes    Bladder cancer (HCC) 10/17/2016   CAD (coronary artery disease), native coronary artery 04/2023   Coronary CTA showed a coronary calcium score to 46 with less than 25% left main, 25 to 49% proximal LAD, less than 25% left circumflex stenosis   Cervical spondylosis    Cervicalgia    2 screws in place  Depression with anxiety 10/17/2016   prior to husbands death    Dyspnea    Gross hematuria    developed after first hip replacment, led to indicental finding of bladder tumor, bleeding now resolved    H/O total shoulder replacement, left    History of kidney stones    1970's   Left hand weakness    due to reinjury of flxor tendon s/p tendon surgery march 12-2018   Lumbar stenosis    Malignant tumor of urinary bladder (HCC) 07/2016   Pre-stage I   OA (osteoarthritis)    Parkinson disease dx 2012   neurologist-  dr siddiqui at [facility]   Psoriatic arthritis [facility])     Dr Nickola Major , [place]    Rupture of flexor tendon of hand 2020   right    Current Outpatient Medications on File Prior to Visit  Medication Sig Dispense Refill   amLODipine (NORVASC) 5 MG tablet TAKE 1 TABLET (5 MG TOTAL) BY MOUTH DAILY. 90 tablet 3   Apoaequorin (PREVAGEN PO) Take 1 tablet by mouth in the morning.      carbidopa-levodopa (SINEMET IR) 25-100 MG per tablet Take 0.5-1 tablets by mouth See admin instructions. Take 1 tablet by mouth in the morning upon waking & take 0.5 tablet by mouth every 2 hours thereafter for a total of 6 tablets daily.     Carbidopa-Levodopa ER (SINEMET CR) 25-100 MG tablet controlled release Take 1 tablet by mouth at bedtime.     cholecalciferol (VITAMIN D3) 25 MCG (1000 UNIT) tablet Take 1,000 Units by mouth in the morning.     cholestyramine (QUESTRAN) 4 g packet Take 1 packet (4 g total) by mouth 2 (two) times daily as needed. 30 each 2   Coenzyme Q10 (COQ10 PO) Take 1 tablet by mouth in the morning.     diazepam (VALIUM) 5 MG tablet TAKE 1 TABLET (5 MG TOTAL) BY MOUTH EVERY 12 (TWELVE) HOURS AS NEEDED. FOR ANXIETY 60 tablet 1   DIGESTIVE ENZYMES PO Take 1 capsule by mouth 3 (three) times daily before meals. Active Enzymes by Leotis Shames     EPINEPHrine 0.3 mg/0.3 mL IJ SOAJ injection Inject 0.3 mg into the muscle as needed for anaphylaxis.     fluticasone (CUTIVATE) 0.05 % cream Apply 1 application topically daily as needed (scalp psoriasis).  1   golimumab (SIMPONI ARIA) 50 MG/4ML SOLN injection Inject 50 mg into the vein See admin instructions. Given every 2 months - Last given 06/28/2019     hydrochlorothiazide (HYDRODIURIL) 25 MG tablet Take 1 tablet (25 mg total) by mouth daily. 90 tablet 1   melatonin 5 MG TABS Take 5 mg by mouth at bedtime.     meloxicam (MOBIC) 15 MG tablet Take 15 mg by mouth daily as needed (joint pain (max 3-4 times/weekly)).     metoprolol tartrate (LOPRESSOR) 100 MG tablet Take 1 tablet (100 mg total) by mouth once for 1 dose. Take 90-120 minutes prior to scan. 1 tablet 0   Multiple Vitamin (MULTIVITAMIN WITH MINERALS) TABS tablet Take 1 tablet by mouth in the morning. Centrum Silver     Polyethyl Glycol-Propyl Glycol (SYSTANE) 0.4-0.3 % SOLN Place 1-2 drops into both eyes 3 (three) times daily as needed (dry/irritated eyes.).     Probiotic  Product (PROBIOTIC PO) Take 1 capsule by mouth in the morning and at bedtime. Restore Ultimate Probiotic     sulfamethoxazole-trimethoprim (BACTRIM DS) 800-160 MG tablet Take 1 tablet by mouth  2 (two) times daily. 10 tablet 0   tiZANidine (ZANAFLEX) 4 MG tablet Take 0.5-1 tablets (2-4 mg total) by mouth every 8 (eight) hours as needed for muscle spasms. 30 tablet 0   No current facility-administered medications on file prior to visit.    Allergies  Allergen Reactions   Cleocin [Clindamycin] Diarrhea   Codeine Nausea And Vomiting   Hornet Venom    Penicillins Itching and Swelling    Has patient had a PCN reaction causing immediate rash, facial/tongue/throat swelling, SOB or lightheadedness with hypotension: Yes Has patient had a PCN reaction causing severe rash involving mucus membranes or skin necrosis: No Has patient had a PCN reaction that required hospitalization: No Has patient had a PCN reaction occurring within the last 10 years: No If all of the above answers are "NO", then may proceed with Cephalosporin use.    Vibramycin [Doxycycline] Diarrhea   Yellow Jacket Venom Swelling   Lodine [Etodolac] Rash   Tramadol Itching and Rash    Assessment/Plan:  1. Hyperlipidemia -  Problem  Hyperlipidemia   Hyperlipidemia Assessment:  LDL goal: < 70 mg/dl last LDLc  78  mg/dl ( 16/1096) Due to chronic back and neck pain problems afraid to try any statins  Discussed how low dose statins hydrophilic statins may not give that bad muscle or joint pain also discussed Zetia, discussed lipid lowering effect and primary prevention benefits for both options  Willing to try low dose Crestor twice week along with Zetia  Eats healthy home cooked meals, go to PT twice a week for neck pain, chronic back and neck pain restricts mobility  Plan: Start taking Crestor 5 mg twice week will f/u via call to assess tolerability in 2 weeks  After trial of Crestor for 2 weeks try Zetia 10 mg daily  Will  repeat FLP and LFT in 6-8 weeks      Thank you,  Carmela Hurt, Pharm.D [facility] A Division of [facility] [address] [place], [place] [address]  Phone: [phone]; Fax: [phone]

## 2023-04-27 NOTE — Therapy (Signed)
OUTPATIENT PHYSICAL THERAPY TREATMENT     Patient Name: Stephanie [name] MRN: [id number] DOB:Mar 05, 1932, 89 y.o., female Today's Date: 04/27/2023   END OF SESSION:  PT End of Session - 04/27/23 1146     Visit Number 15    Number of Visits 20    Date for PT Re-Evaluation 05/24/23    Authorization Type Medicare & BCBS    Progress Note Due on Visit 20    PT Start Time 1146    PT Stop Time 1234    PT Time Calculation (min) 48 min    Activity Tolerance Patient tolerated treatment well    Behavior During Therapy WFL for tasks assessed/performed                Past Medical History:  Diagnosis Date   Allergy    Anemia    past hx    Arthropathy of cervical spine 11/27/2012   Right  Xray at [facility]  On 04/08/2016  AP, lateral, and lateral flexion and extension views of the cervical spine are submitted for evaluation.   No acute fracture identified in the cervical spine.   There is redemonstration of approximately 2 mm of C3 on C4 anterolisthesis in neutral which slightly increases in flexion relative to neutral and extension. Slight C5 on C6 retrolisthesis does not change i   Benign hypertension without congestive heart failure    Benign mole    right hcets mole, bleeds when dried with towel   Bilateral dry eyes    Bladder cancer (HCC) 10-21-2016   CAD (coronary artery disease), native coronary artery 04/2023   Coronary CTA showed a coronary calcium score to 46 with less than 25% left main, 25 to 49% proximal LAD, less than 25% left circumflex stenosis   Cervical spondylosis    Cervicalgia    2 screws in place    Depression with anxiety 10/21/2016   prior to husbands death    Dyspnea    Gross hematuria    developed after first hip replacment, led to indicental finding of bladder tumor, bleeding now resolved    H/O total shoulder replacement, left    History of kidney stones    1970's   Left hand weakness    due to reinjury of flxor tendon s/p tendon surgery march 12-2018    Lumbar stenosis    Malignant tumor of urinary bladder (HCC) 07/2016   Pre-stage I   OA (osteoarthritis)    Parkinson disease dx 2012   neurologist-  dr siddiqui at [facility]   Psoriatic arthritis [facility])     Dr Nickola Major , [place] [facility]    Rupture of flexor tendon of hand 2020   right   Past Surgical History:  Procedure Laterality Date   BIOPSY MASS LEFT FIRST METACARPAL   06/23/2006   benign   CATARACT EXTRACTION W/ INTRAOCULAR LENS  IMPLANT, BILATERAL  1999 and 2003   CERVICAL FUSION  02/2017   c1-c2 ; reports she has 2 screws 2in long in place done at [facility] ; patient exhibits VERY LIMITED NECK ROM   COLONOSCOPY     CYSTOSCOPY W/ RETROGRADES Bilateral 09/22/2016   Procedure: CYSTOSCOPY WITH RETROGRADE PYELOGRAM;  Surgeon: Sebastian Ache, MD;  Location: [facility];  Service: Urology;  Laterality: Bilateral;   D & C HYSTEROSCOPY W/ RESECTION POLYP  07/11/2000   DILATION AND CURETTAGE OF UTERUS  1960's   EXCISION CYST AND DEBRIDEMENT RIGHT WIRST AND REMOVAL FORGEIGN BODY  01/09/2009  FLEXOR TENDON REPAIR Left 12/12/2018   Procedure: REPAIR/TRANSFER FLEXOR DIGITORUM PROFUNDUS OF LEFT SMALL FINGER;  Surgeon: Cindee Salt, MD;  Location: [facility];  Service: Orthopedics;  Laterality: Left;   LAPAROSCOPY  yrs ago   infertility    METACARPOPHALANGEAL JOINT ARTHRODESIS Right 05/25/1996   REVERSE SHOULDER ARTHROPLASTY Left 10/14/2022   Procedure: REVERSE SHOULDER ARTHROPLASTY;  Surgeon: Francena Hanly, MD;  Location: [facility];  Service: Orthopedics;  Laterality: Left;    TENDON REPAIR Left 01/15/2019   Hand   TONSILLECTOMY AND ADENOIDECTOMY  child   TOTAL HIP ARTHROPLASTY Left 07/07/2016   Procedure: LEFT TOTAL HIP ARTHROPLASTY ANTERIOR APPROACH;  Surgeon: Ollen Gross, MD;  Location: [facility];  Service: Orthopedics;  Laterality: Left;   TOTAL HIP ARTHROPLASTY Right 09/28/2017   Procedure: RIGHT TOTAL HIP ARTHROPLASTY ANTERIOR APPROACH;  Surgeon:  Ollen Gross, MD;  Location: [facility];  Service: Orthopedics;  Laterality: Right;   TOTAL KNEE ARTHROPLASTY Right 07/30/2019   Procedure: TOTAL KNEE ARTHROPLASTY;  Surgeon: Ollen Gross, MD;  Location: [facility];  Service: Orthopedics;  Laterality: Right;    TRANSURETHRAL RESECTION OF BLADDER TUMOR N/A 09/22/2016   Procedure: TRANSURETHRAL RESECTION OF BLADDER TUMOR (TURBT);  Surgeon: Sebastian Ache, MD;  Location[facility];  Service: Urology;  Laterality: N/A;   Patient Active Problem List   Diagnosis Date Noted   CAD (coronary artery disease), native coronary artery 04/20/2023   S/P reverse total shoulder arthroplasty, left 10/14/2022   Preoperative cardiovascular examination 10/04/2022   SOB (shortness of breath) 08/02/2022   Insomnia 08/02/2022   Hyponatremia 08/01/2022   Shoulder pain, left 09/14/2021   History of COVID-19 01/06/2021   Diarrhea 09/03/2020   Vitamin D deficiency 09/03/2020   OA (osteoarthritis) of knee 07/30/2019   Tinea corporis 06/08/2019   Psoriatic arthritis (HCC) 04/02/2019   Anemia 03/13/2018   Hot flashes 03/13/2018   S/P cervical spinal fusion 03/31/2017   Neck pain 12/12/2016   Depression with anxiety 10/17/2016   Bladder cancer (HCC) 10/17/2016   OA (osteoarthritis) of hip 07/07/2016   Spinal stenosis of lumbar region with radiculopathy 04/08/2016   Spondylolisthesis of lumbar region 04/08/2016   Spondylosis of cervical region without myelopathy or radiculopathy 04/08/2016   Preventative health care 01/18/2016   Low back pain 01/18/2016   History of chicken pox    Hyperlipidemia 12/15/2014   Allergic rhinitis 06/25/2014   Allergy to bee sting 06/10/2014   TMJ tenderness 11/16/2013   Other malaise and fatigue 05/07/2013   Arthropathy of cervical spine (HCC) 11/27/2012   Muscle spasms of neck 11/27/2012   Parkinson's disease 05/24/2012   Conjunctivochalasis 03/09/2012   Epiretinal membrane 03/09/2012   Hyperopia with  astigmatism and presbyopia 03/09/2012   Pseudophakia 03/09/2012   HTN (hypertension) 08/31/2011   Right knee pain 08/31/2011   Benign hypertensive heart disease without heart failure 05/12/2011    PCP: Bradd Canary, MD   REFERRING PROVIDER: Romero Belling, MD   REFERRING DIAG: Multilevel cervical spondylosis, L neck & trapezial myofascial pain/contracture   THERAPY DIAG:  Cervicalgia  Abnormal posture  Muscle weakness (generalized)  Other muscle spasm  RATIONALE FOR EVALUATION AND TREATMENT: Rehabilitation  ONSET DATE: acute on chronic  NEXT MD VISIT: 05/05/23   SUBJECTIVE:  SUBJECTIVE STATEMENT: Pt reports she has been having GI issues so she has stopped all OTC vitamins and meds except her enteric coated baby aspirin, but still taking her prescribed meds although trying to limit pain meds. She reports her appt with her neurosurgeon yesterday was rescheduled for next week.  PAIN: Are you having pain? Yes: NPRS scale: 6-7/10 Pain location: L neck and scapula Pain description: "just constant", maybe tightness, shooting up back of neck to head Aggravating factors: any movement of head and neck, dyskinesia from PD meds Relieving factors: sitting still in her recliner  PERTINENT HISTORY:  Chronic neck pain, L shoulder advanced OA s/p reverse TSA on 10/14/22; R TKA 07/2019, B THR 2018 & 2017, posterior C1-2 cervical fusion 2018, rupture of flexor tendon of L hand with failed surgical repair 2020, Parkinson's, psoriatic arthritis, bladder cancer, HTN    PRECAUTIONS: None  HAND DOMINANCE: Right  WEIGHT BEARING RESTRICTIONS: No  FALLS:  Has patient fallen in last 6 months? No  LIVING ENVIRONMENT: Lives with: lives alone Lives in: House/apartment Stairs: Yes: External: 4  steps; on right going up, on left going up, and can reach both Has following equipment at home: Single point cane, Walker - 2 wheeled, Shower bench, bed side commode, and Grab bars  OCCUPATION: Retired  [employer]: Independent and Leisure: volunteering at [employer], bible study, walking 1/3 mile daily - unable recently due to pain   PATIENT GOALS: "Get rid of this pain so I can go back to my normal activities."   OBJECTIVE: (objective measures completed at initial evaluation unless otherwise dated)  DIAGNOSTIC FINDINGS:  Cervical MRI - results not available, but pt reports she was told she had severe OA  02/07/23 - Cervical spine x-ray: IMPRESSION: Multilevel degenerative change and postoperative change without acute abnormality.  PATIENT SURVEYS:  NDI 26 / 50 = 52.0 % ; 17 / 50 = 34.0 %  COGNITION: Overall cognitive status: Within functional limits for tasks assessed  SENSATION: WFL  POSTURE:  rounded shoulders, forward head, weight shift right, and head laterally shifted to R  PALPATION: Severe increased muscle tension and TTP in L>R UT, LS and cervical paraspinals as well as L periscapular muscles   CERVICAL ROM:   Active ROM eval 04/12/23  Flexion 46 41  Extension 22 * 30 *  Right lateral flexion 16 * 12 *  Left lateral flexion 10 * 20 *  Right rotation 16 * 35 *  Left rotation 18* 25 *   (Blank rows = not tested, * = pain) 02/21/23 - clicking with B rotation  UPPER EXTREMITY ROM:  Active ROM Right  eval Left    eval Right 04/12/23 Left 04/12/23  Shoulder flexion 146 111 156 135  Shoulder extension 50 42 55 38  Shoulder abduction 155 118 178 125  Shoulder adduction      Shoulder internal rotation FIR to sacrum FIR to lateral buttock FIR L5 FIR to lateral buttock  Shoulder external rotation FER T3 FER T1 FER T3 FER T1  Elbow flexion      Elbow extension      Wrist flexion      Wrist extension      Wrist ulnar deviation      Wrist radial deviation      Wrist pronation       Wrist supination       (Blank rows = not tested)  UPPER EXTREMITY MMT:   MMT Right 03/03/23 Left 03/03/23 Right 04/12/23 Left 04/12/23  Shoulder  flexion 4+ 4- 5 4, pain in scapula  Shoulder extension 4+ 4+ 5 4+, clicking in neck  Shoulder abduction 4+ 4 5 4, pain in L lat neck  Shoulder adduction      Shoulder internal rotation 4+ 4 4+ 4+  Shoulder external rotation 4 4 4+ 4   (Blank rows = not tested)  CERVICAL SPECIAL TESTS:  Spurling's test: Positive and Distraction test: Positive   TODAY'S TREATMENT:   04/27/23 THERAPEUTIC EXERCISE: to improve flexibility, strength and mobility.  Verbal and tactile cues throughout for technique.  NuStep - L4 x 4 min (better tolerated that UBE today) Seated thoracic extension over back of chair with arms folded over chest x 10, with hands behind neck for pec stretch x 10 (Airex pad on seat of chair to raise her up and feet resting on 4" step) Seated L/R UT stretch - gentle lateral lean with hand under seat (elevated with Airex pad due pt having long arms) 2 x 30" Seated L/R LS stretch - gentle lateral lean with hand under seat (elevated with Airex pad due pt having long arms) 2 x 30" Seated scap retraction into pool noodle on back of chair 10 x 3-5" Seated YTB scap retraction into pool noodle + B shoulder horiz ABD x 10 Seated YTB scap retraction into pool noodle + B shoulder horiz ABD diagonals (small angle) x 10 Seated YTB scap retraction into pool noodle + B shoulder ER x 10   04/25/23 THERAPEUTIC EXERCISE: to improve flexibility, strength and mobility.  Verbal and tactile cues throughout for technique.  UBE: L1.0 x 5 min (backwards)  Seated thoracic extension over back of chair with arms folded over chest x 10, with hands behind neck for pec stretch x 10 (Airex pad on seat of chair to raise her up and feet resting on 4" step) Seated scap retraction into pool noodle on back of chair 10 x 3-5" Seated YTB scap retraction into pool noodle + B  shoulder horiz ABD x 10 Seated YTB scap retraction into pool noodle + B shoulder horiz ABD diagonals x 10 Standing leaning over orange Pball on mat table: Cervical retraction + thoracic extension + B bent arm row x 10 Cervical retraction + thoracic extension + B shoulder extension x 10  MANUAL THERAPY: To promote normalized muscle tension, improved flexibility, improved joint mobility, increased ROM, and reduced pain.  STM/DTM, manual TPR to L UT, LS and medial periscapular muscles   04/21/23 THERAPEUTIC EXERCISE: to improve flexibility, strength and mobility.  Verbal and tactile cues throughout for technique.  UBE: L1.0 x 4 min (backwards)  Shoulder shrugs 2 x 10 - 1 set each w/o and with IFC estim (Remaining exercises performed with IFC estim) Shoulder rolls x 10 each direction Seated YTB scap retraction + B rows x 10 Seated YTB scap retraction + B lat pulldowns x 10 Seated YTB scap retraction + B shoulder extension to neutral x 10 Seated YTB scap retraction + B shoulder ER x 10 Seated YTB scap retraction + B shoulder horiz ABD (low angle) x 10  MANUAL THERAPY: To promote normalized muscle tension, improved flexibility, improved joint mobility, increased ROM, and reduced pain. STM/DTM, manual TPR to L medial and lateral periscapular muscles, L supraspinatus, L teres group/lats and L UT & LS  MODALITIES: IFC to L shoulder complex, 80-150 Hz, intensity to patient tolerance x 30 min - 15 min during performance of exercises and 15 min with moist heat at end of session to  reduce pain and muscle guarding   PATIENT EDUCATION:  Education details:  Instruction in set-up and use of home TENS unit    Person educated: Patient Education method: Explanation, Demonstration, and Verbal cues Education comprehension: verbalized understanding  HOME EXERCISE PROGRAM: Access Code: [email or web address] URL: [email or web address] Date: 04/27/2023 Prepared by: Glenetta Hew  Exercises - Seated  Cervical Retraction  - 2-3 x daily - 7 x weekly - 2 sets - 10 reps - 3-5 sec hold - Shoulder Rolls in Sitting  - 2-3 x daily - 7 x weekly - 2 sets - 10 reps - 3 sec hold - Seated Scapular Retraction  - 2-3 x daily - 7 x weekly - 2 sets - 10 reps - 5 sec hold - Sternocleidomastoid Stretch  - 2-3 x daily - 7 x weekly - 3 reps - 30 sec hold - Standing Bilateral Low Shoulder Row with Anchored Resistance  - 1 x daily - 3-4 x weekly - 2 sets - 10 reps - 5 sec hold - Scapular Retraction with Resistance Advanced  - 1 x daily - 3-4 x weekly - 2 sets - 10 reps - 5 sec hold - Seated Shoulder Flexion Full Range (Mirrored)  - 1 x daily - 3-4 x weekly - 2 sets - 10 reps - 3 sec hold - Seated Single Arm Shoulder Abduction - Thumb Up  - 1 x daily - 3-4 x weekly - 2 sets - 10 reps - 3 sec hold - Shoulder Internal Rotation with Resistance (Mirrored)  - 1 x daily - 3 x weekly - 1-2 sets - 10 reps - 3 sec hold - Shoulder External Rotation with Anchored Resistance  - 1 x daily - 3 x weekly - 1-2 sets - 10 reps - 3 sec hold - Standing Isometric Cervical Sidebending with Manual Resistance  - 1 x daily - 7 x weekly - 1 sets - 5 reps - 10 sec hold hold - Standing Isometric Cervical Flexion with Manual Resistance  - 1 x daily - 7 x weekly - 1 sets - 5 reps - 10 sec hold - Standing Isometric Cervical Extension with Manual Resistance  - 1 x daily - 7 x weekly - 1 sets - 5 reps - 10 sec hold - Seated Isometric Cervical Rotation  - 1 x daily - 7 x weekly - 1 sets - 5 reps - 10 sec hold - Seated Shoulder Horizontal Abduction with Resistance  - 1 x daily - 3-4 x weekly - 2 sets - 10 reps - 3 sec hold   ASSESSMENT:  CLINICAL IMPRESSION: Karaleigh continues to report constant pain and expresses frustration and her inability to get consistent relief from pain.  She noted improvement in pain following gentle exercises last session however when trying to replicate this at home she noted increased pain.  Reviewed exercises from last  visit providing clarification of posture/positioning and and proper movement patterns, emphasizing avoidance of pushing motions into painful ROM as well as adjusting reps based on fatigue.  Patient also noting that some exercises were tried at home with red rather than yellow Thera-Band resistance, therefore patient cautioned to stick with just the yellow band at this time (new YTB provided for home use).  Patient reminded that no exercises should be causing her increased pain at home, where at most she should feel gentle stretching or muscle fatigue.  If exercises increase pain despite modifications, she should hold off on performing these at home, and inform PT at next visit.  OBJECTIVE IMPAIRMENTS: decreased activity tolerance, decreased endurance, decreased knowledge of condition, decreased mobility, difficulty walking, decreased ROM, decreased strength, hypomobility, increased fascial restrictions, impaired perceived functional ability, increased muscle spasms, impaired flexibility, impaired UE functional use, improper body mechanics, postural dysfunction, and pain.   ACTIVITY LIMITATIONS: carrying, lifting, standing, sleeping, transfers, bed mobility, bathing, toileting, dressing, reach over head, and hygiene/grooming  PARTICIPATION LIMITATIONS: meal prep, cleaning, laundry, driving, shopping, community activity, occupation, yard work, and church  PERSONAL FACTORS: Age, Fitness, Past/current experiences, Time since onset of injury/illness/exacerbation, and 3+ comorbidities: Chronic neck pain, L shoulder advanced OA s/p reverse TSA on 10/14/22; R TKA 07/2019, B THR 2018 & 2017, posterior C1-2 cervical fusion 2018, rupture of flexor tendon of L hand with failed surgical repair 2020, Parkinson's, psoriatic arthritis, bladder cancer, HTN    are also affecting patient's functional outcome.   REHAB POTENTIAL: Good  CLINICAL DECISION MAKING: Evolving/moderate complexity  EVALUATION COMPLEXITY:  Moderate   GOALS: Goals reviewed with patient? Yes  SHORT TERM GOALS: Target date: 03/21/2023, extended to 05/03/2023   Patient will be independent with initial HEP to improve outcomes and carryover.  Baseline: Established 03/03/23 Goal status: MET  03/31/23  2.  Patient will report 25% improvement in neck pain to improve QOL.   Baseline: 8-10/10 Goal status: PARTIALLY MET  04/25/23 - minimal to no change in pain per pt report; pain ratings averaging from 3-4/10 up to 5-6/10 in recent visits as compared to 8-10/10 at eval  LONG TERM GOALS: Target date: 04/18/2023, extended to 05/24/2023   Patient will be independent with ongoing/advanced HEP for self-management at home.  Baseline:  Goal status: PARTIALLY MET  04/25/23 - Met for current HEP  2.  Patient will demonstrate improved posture to decrease muscle imbalance. Baseline: rounded shoulders, forward head, weight shift right, and head laterally shifted to R Goal status: IN PROGRESS  04/25/23 - still with significant forward head and flexed cervical and upper thoracic posture  3.  Patient will report 50-75% improvement in neck pain to improve QOL.  Baseline: 8-10/10 Goal status: IN PROGRESS  04/12/23 - minimal to no change in pain per pt report; pain ratings averaging 5-6/10 in recent visits as compared to 8-10/10 at eval  4.  Patient will demonstrate functional pain free cervical ROM for safety with driving.  Baseline: refer to above cervical ROM table Goal status: IN PROGRESS  04/12/23 - ROM improving in most planes but still painful for al directions except flexion   5.  Patient will report </= 37% on NDI to demonstrate improved functional ability.  Baseline: 26 / 50 = 52.0 % Goal status: MET  04/12/23 - 17 / 50 = 34.0 %  6  Patient will report >/= 50% improvement in her sleep due to decreased neck pain. Baseline: Sleep greatly disturbed for up to 3-5 hrs due to neck pain per NDI Goal status: PARTIALLY MET  04/12/23 - Sleep mildly  disturbed  for up to 1-2 hrs per NDI  7.  Patient will be able to resume attending church services w/o limitation due to neck pain. Baseline: Has been unable to attend church Goal status: IN PROGRESS  04/25/23 - She has not been able to return to church d/t limited unsupported sitting tolerance as well as increased pain as she tries to turn her head to observe the services  8. Patient will be able to resume walking for exercise w/o limitation due to neck pain. Baseline: Has been unable to walk d/t pain Goal status:  IN PROGRESS  04/25/23 - She has attempting walking for exercise but tolerance remains variable d/t neck pain   PLAN:  PT FREQUENCY: 2x/week  PT DURATION: 8 weeks  PLANNED INTERVENTIONS: Therapeutic exercises, Therapeutic activity, Neuromuscular re-education, Balance training, Patient/Family education, Self Care, Joint mobilization, Dry Needling, Electrical stimulation, Spinal mobilization, Cryotherapy, Moist heat, Taping, Ultrasound, Ionotophoresis 4mg /ml Dexamethasone, Manual therapy, and Re-evaluation  PLAN FOR NEXT SESSION: gently progress isometrics, cervical flexibility and ROM, postural correction and strengthening - thoracic extension and scapular retraction as tolerated; review and progress HEP as able; MT +/- DN as indicated and benefit noted   Marry Guan, PT 04/27/2023, 12:48 PM

## 2023-04-28 ENCOUNTER — Ambulatory Visit (HOSPITAL_COMMUNITY): Payer: Medicare Other | Attending: Cardiology | Admitting: Student

## 2023-04-28 ENCOUNTER — Encounter: Payer: Self-pay | Admitting: Student

## 2023-04-28 DIAGNOSIS — R0602 Shortness of breath: Secondary | ICD-10-CM | POA: Insufficient documentation

## 2023-04-28 DIAGNOSIS — E7800 Pure hypercholesterolemia, unspecified: Secondary | ICD-10-CM | POA: Diagnosis not present

## 2023-04-28 MED ORDER — ROSUVASTATIN CALCIUM 5 MG PO TABS: 5.0000 mg | ORAL_TABLET | ORAL | 1 refills | Status: DC

## 2023-04-28 MED ORDER — EZETIMIBE 10 MG PO TABS: 10.0000 mg | ORAL_TABLET | Freq: Every day | ORAL | 3 refills | Status: DC

## 2023-04-28 NOTE — Patient Instructions (Addendum)
Changes made by your pharmacist Carmela Hurt, PharmD at today's visit:    Instructions/Changes  (what do you need to do) Your Notes  (what you did and when you did it)  Start taking Zetia 10 mg daily and Crestor 5 mg 2 times week    2. Continue doing regular exercise and follow healthy diet      Go for follow up lab in 6-8 weeks.    If you have any questions or concerns please use My Chart to send questions or call the office at [phone]

## 2023-04-28 NOTE — Assessment & Plan Note (Signed)
Assessment:  LDL goal: < 70 mg/dl last LDLc  78  mg/dl ( 16/1096) Due to chronic back and neck pain problems afraid to try any statins  Discussed how low dose statins hydrophilic statins may not give that bad muscle or joint pain also discussed Zetia, discussed lipid lowering effect and primary prevention benefits for both options  Willing to try low dose Crestor twice week along with Zetia  Eats healthy home cooked meals, go to PT twice a week for neck pain, chronic back and neck pain restricts mobility  Plan: Start taking Crestor 5 mg twice week will f/u via call to assess tolerability in 2 weeks  After trial of Crestor for 2 weeks try Zetia 10 mg daily  Will repeat FLP and LFT in 6-8 weeks

## 2023-04-29 ENCOUNTER — Ambulatory Visit (HOSPITAL_BASED_OUTPATIENT_CLINIC_OR_DEPARTMENT_OTHER): Payer: Medicare Other

## 2023-04-29 DIAGNOSIS — E7800 Pure hypercholesterolemia, unspecified: Secondary | ICD-10-CM | POA: Diagnosis not present

## 2023-04-29 DIAGNOSIS — R0602 Shortness of breath: Secondary | ICD-10-CM | POA: Diagnosis not present

## 2023-04-29 LAB — ECHOCARDIOGRAM COMPLETE
Area-P 1/2: 2.45 cm2
Calc EF: 66.6 %
S' Lateral: 2 cm
Single Plane A2C EF: 62 %
Single Plane A4C EF: 68.8 %

## 2023-05-03 ENCOUNTER — Ambulatory Visit: Payer: Medicare Other | Admitting: Physical Therapy

## 2023-05-03 ENCOUNTER — Encounter: Payer: Self-pay | Admitting: Physical Therapy

## 2023-05-03 DIAGNOSIS — M6281 Muscle weakness (generalized): Secondary | ICD-10-CM | POA: Diagnosis not present

## 2023-05-03 DIAGNOSIS — M542 Cervicalgia: Secondary | ICD-10-CM

## 2023-05-03 DIAGNOSIS — M62838 Other muscle spasm: Secondary | ICD-10-CM | POA: Diagnosis not present

## 2023-05-03 DIAGNOSIS — R293 Abnormal posture: Secondary | ICD-10-CM

## 2023-05-03 NOTE — Therapy (Signed)
OUTPATIENT PHYSICAL THERAPY TREATMENT   Progress Note  Reporting Period 04/12/2023 to 05/03/2023  See note below for Objective Data and Assessment of Progress/Goals.    Patient Name: Stephanie [name] MRN: [id number] DOB:August 01, 1932, 89 y.o., female Today's Date: 05/03/2023   END OF SESSION:  PT End of Session - 05/03/23 1059     Visit Number 16    Number of Visits 20    Date for PT Re-Evaluation 05/24/23    Authorization Type Medicare & BCBS    Progress Note Due on Visit 20    PT Start Time 1059    PT Stop Time 1151    PT Time Calculation (min) 52 min    Activity Tolerance Patient tolerated treatment well    Behavior During Therapy WFL for tasks assessed/performed                Past Medical History:  Diagnosis Date   Allergy    Anemia    past hx    Arthropathy of cervical spine 11/27/2012   Right  Xray at [facility]  On 04/08/2016  AP, lateral, and lateral flexion and extension views of the cervical spine are submitted for evaluation.   No acute fracture identified in the cervical spine.   There is redemonstration of approximately 2 mm of C3 on C4 anterolisthesis in neutral which slightly increases in flexion relative to neutral and extension. Slight C5 on C6 retrolisthesis does not change i   Benign hypertension without congestive heart failure    Benign mole    right hcets mole, bleeds when dried with towel   Bilateral dry eyes    Bladder cancer (HCC) Nov 16, 2016   CAD (coronary artery disease), native coronary artery 04/2023   Coronary CTA showed a coronary calcium score to 46 with less than 25% left main, 25 to 49% proximal LAD, less than 25% left circumflex stenosis   Cervical spondylosis    Cervicalgia    2 screws in place    Depression with anxiety Nov 16, 2016   prior to husbands death    Dyspnea    Gross hematuria    developed after first hip replacment, led to indicental finding of bladder tumor, bleeding now resolved    H/O total shoulder replacement, left     History of kidney stones    1970's   Left hand weakness    due to reinjury of flxor tendon s/p tendon surgery march 12-2018   Lumbar stenosis    Malignant tumor of urinary bladder (HCC) 07/2016   Pre-stage I   OA (osteoarthritis)    Parkinson disease dx 2012   neurologist-  dr siddiqui at [facility]   Psoriatic arthritis [facility])     Dr Nickola Major , [place] [facility]    Rupture of flexor tendon of hand 2020   right   Past Surgical History:  Procedure Laterality Date   BIOPSY MASS LEFT FIRST METACARPAL   06/23/2006   benign   CATARACT EXTRACTION W/ INTRAOCULAR LENS  IMPLANT, BILATERAL  1999 and 2003   CERVICAL FUSION  02/2017   c1-c2 ; reports she has 2 screws 2in long in place done at [facility] ; patient exhibits VERY LIMITED NECK ROM   COLONOSCOPY     CYSTOSCOPY W/ RETROGRADES Bilateral 09/22/2016   Procedure: CYSTOSCOPY WITH RETROGRADE PYELOGRAM;  Surgeon: Sebastian Ache, MD;  Location: [facility];  Service: Urology;  Laterality: Bilateral;   D & C HYSTEROSCOPY W/ RESECTION POLYP  07/11/2000   DILATION AND CURETTAGE  OF UTERUS  1960's   EXCISION CYST AND DEBRIDEMENT RIGHT WIRST AND REMOVAL FORGEIGN BODY  01/09/2009   FLEXOR TENDON REPAIR Left 12/12/2018   Procedure: REPAIR/TRANSFER FLEXOR DIGITORUM PROFUNDUS OF LEFT SMALL FINGER;  Surgeon: Cindee Salt, MD;  Location:  SURGERY CENTER;  Service: Orthopedics;  Laterality: Left;   LAPAROSCOPY  yrs ago   infertility    METACARPOPHALANGEAL JOINT ARTHRODESIS Right 05/25/1996   REVERSE SHOULDER ARTHROPLASTY Left 10/14/2022   Procedure: REVERSE SHOULDER ARTHROPLASTY;  Surgeon: Francena Hanly, MD;  Location: [facility];  Service: Orthopedics;  Laterality: Left;    TENDON REPAIR Left 01/15/2019   Hand   TONSILLECTOMY AND ADENOIDECTOMY  child   TOTAL HIP ARTHROPLASTY Left 07/07/2016   Procedure: LEFT TOTAL HIP ARTHROPLASTY ANTERIOR APPROACH;  Surgeon: Ollen Gross, MD;  Location: [facility];  Service: Orthopedics;   Laterality: Left;   TOTAL HIP ARTHROPLASTY Right 09/28/2017   Procedure: RIGHT TOTAL HIP ARTHROPLASTY ANTERIOR APPROACH;  Surgeon: Ollen Gross, MD;  Location: [facility];  Service: Orthopedics;  Laterality: Right;   TOTAL KNEE ARTHROPLASTY Right 07/30/2019   Procedure: TOTAL KNEE ARTHROPLASTY;  Surgeon: Ollen Gross, MD;  Location: [facility];  Service: Orthopedics;  Laterality: Right;    TRANSURETHRAL RESECTION OF BLADDER TUMOR N/A 09/22/2016   Procedure: TRANSURETHRAL RESECTION OF BLADDER TUMOR (TURBT);  Surgeon: Sebastian Ache, MD;  Location: [facility];  Service: Urology;  Laterality: N/A;   Patient Active Problem List   Diagnosis Date Noted   CAD (coronary artery disease), native coronary artery 04/20/2023   S/P reverse total shoulder arthroplasty, left 10/14/2022   Preoperative cardiovascular examination 10/04/2022   SOB (shortness of breath) 08/02/2022   Insomnia 08/02/2022   Hyponatremia 08/01/2022   Shoulder pain, left 09/14/2021   History of COVID-19 01/06/2021   Diarrhea 09/03/2020   Vitamin D deficiency 09/03/2020   OA (osteoarthritis) of knee 07/30/2019   Tinea corporis 06/08/2019   Psoriatic arthritis (HCC) 04/02/2019   Anemia 03/13/2018   Hot flashes 03/13/2018   S/P cervical spinal fusion 03/31/2017   Neck pain 12/12/2016   Depression with anxiety 10/17/2016   Bladder cancer (HCC) 10/17/2016   OA (osteoarthritis) of hip 07/07/2016   Spinal stenosis of lumbar region with radiculopathy 04/08/2016   Spondylolisthesis of lumbar region 04/08/2016   Spondylosis of cervical region without myelopathy or radiculopathy 04/08/2016   Preventative health care 01/18/2016   Low back pain 01/18/2016   History of chicken pox    Hyperlipidemia 12/15/2014   Allergic rhinitis 06/25/2014   Allergy to bee sting 06/10/2014   TMJ tenderness 11/16/2013   Other malaise and fatigue 05/07/2013   Arthropathy of cervical spine (HCC) 11/27/2012   Muscle spasms of neck  11/27/2012   Parkinson's disease 05/24/2012   Conjunctivochalasis 03/09/2012   Epiretinal membrane 03/09/2012   Hyperopia with astigmatism and presbyopia 03/09/2012   Pseudophakia 03/09/2012   HTN (hypertension) 08/31/2011   Right knee pain 08/31/2011   Benign hypertensive heart disease without heart failure 05/12/2011    PCP: Bradd Canary, MD   REFERRING PROVIDER: Romero Belling, MD   REFERRING DIAG: Multilevel cervical spondylosis, L neck & trapezial myofascial pain/contracture   THERAPY DIAG:  Cervicalgia  Abnormal posture  Muscle weakness (generalized)  Other muscle spasm  RATIONALE FOR EVALUATION AND TREATMENT: Rehabilitation  ONSET DATE: acute on chronic  NEXT MD VISIT: 05/05/23   SUBJECTIVE:  SUBJECTIVE STATEMENT: Pt reports her pain remains constant when present and still interferes with her sleep at times. She can typically get relief if she goes and sits down to rest.  PAIN: Are you having pain? Yes: NPRS scale: 5/10 Pain location: L neck and scapula Pain description: "just constant", maybe tightness, shooting up back of neck to head Aggravating factors: any movement of head and neck, dyskinesia from PD meds Relieving factors: sitting still in her recliner, TENS  PERTINENT HISTORY:  Chronic neck pain, L shoulder advanced OA s/p reverse TSA on 10/14/22; R TKA 07/2019, B THR 2018 & 2017, posterior C1-2 cervical fusion 2018, rupture of flexor tendon of L hand with failed surgical repair 2020, Parkinson's, psoriatic arthritis, bladder cancer, HTN    PRECAUTIONS: None  HAND DOMINANCE: Right  WEIGHT BEARING RESTRICTIONS: No  FALLS:  Has patient fallen in last 6 months? No  LIVING ENVIRONMENT: Lives with: lives alone Lives in: House/apartment Stairs: Yes:  External: 4 steps; on right going up, on left going up, and can reach both Has following equipment at home: Single point cane, Walker - 2 wheeled, Shower bench, bed side commode, and Grab bars  OCCUPATION: Retired  [employer]: Independent and Leisure: volunteering at [employer], bible study, walking 1/3 mile daily - unable recently due to pain   PATIENT GOALS: "Get rid of this pain so I can go back to my normal activities."   OBJECTIVE: (objective measures completed at initial evaluation unless otherwise dated)  DIAGNOSTIC FINDINGS:  Cervical MRI - results not available, but pt reports she was told she had severe OA  02/07/23 - Cervical spine x-ray: IMPRESSION: Multilevel degenerative change and postoperative change without acute abnormality.  PATIENT SURVEYS:  NDI 26 / 50 = 52.0 % ; 17 / 50 = 34.0 %  COGNITION: Overall cognitive status: Within functional limits for tasks assessed  SENSATION: WFL  POSTURE:  rounded shoulders, forward head, weight shift right, and head laterally shifted to R  PALPATION: Severe increased muscle tension and TTP in L>R UT, LS and cervical paraspinals as well as L periscapular muscles   CERVICAL ROM:   Active ROM eval 04/12/23 05/03/23  Flexion 46 41 42  Extension 22 * 30 * 53 *  Right lateral flexion 16 * 12 * 20 *  Left lateral flexion 10 * 20 * 29 *  Right rotation 16 * 35 * 50 *  Left rotation 18* 25 * 36   (Blank rows = not tested, * = pain) 02/21/23 - clicking with B rotation  UPPER EXTREMITY ROM:  Active ROM Right  eval Left    eval Right 04/12/23 Left 04/12/23 Right 05/03/23 Left 05/03/23  Shoulder flexion 146 111 156 135 162 129  Shoulder extension 50 42 55 38 56 42  Shoulder abduction 155 118 178 125 181 120  Shoulder adduction        Shoulder internal rotation FIR to sacrum FIR to lateral buttock FIR L5 FIR to lateral buttock FIR WNL FIR to lateral buttock  Shoulder external rotation FER T3 FER T1 FER T3 FER T1 FER T3 FER T2  Elbow flexion         Elbow extension        Wrist flexion        Wrist extension        Wrist ulnar deviation        Wrist radial deviation        Wrist pronation        Wrist  supination         (Blank rows = not tested)  UPPER EXTREMITY MMT:   MMT Right 03/03/23 Left 03/03/23 Right 04/12/23 Left 04/12/23 Right 05/03/23 Left* 05/03/23  Shoulder flexion 4+ 4- 5 4, pain in scapula 5 4+  Shoulder extension 4+ 4+ 5 4+, clicking in neck 5 4+  Shoulder abduction 4+ 4 5 4, pain in L lat neck 5 4  Shoulder adduction        Shoulder internal rotation 4+ 4 4+ 4+ 4+ 4+  Shoulder external rotation 4 4 4+ 4 4+ 4   (Blank rows = not tested) * 05/03/23 - pain at R scapula with all resisted motions  CERVICAL SPECIAL TESTS:  Spurling's test: Positive and Distraction test: Positive   TODAY'S TREATMENT:   05/03/23 THERAPEUTIC EXERCISE: to improve flexibility, strength and mobility.  Verbal and tactile cues throughout for technique.  UBE: L1.0 x 4 min (backwards) Seated YTB scap retraction/depression with lat pull down 2 x 10 Standing leaning over orange Pball on mat table: Cervical retraction + thoracic extension + B bent arm row x 10 Cervical retraction + thoracic extension + B shoulder extension x 10 Cervical retraction + thoracic extension + B low angel horiz ABD x 5 - limited by c/o L posterior shoulder pain Seated gravity resisted cervical retraction 10 x 3" in fwd lean with elbows on thighs  THERAPEUTIC ACTIVITIES: Cervical & shoulder ROM Shoulder MMT Goal assessment  MANUAL THERAPY: To promote normalized muscle tension, improved flexibility, improved joint mobility, increased ROM, and reduced pain. STM/DTM, manual TPR to L lateral/posterior deltoid and lateral periscapular muscles    04/27/23 THERAPEUTIC EXERCISE: to improve flexibility, strength and mobility.  Verbal and tactile cues throughout for technique.  NuStep - L4 x 4 min (better tolerated that UBE today) Seated thoracic extension over back of  chair with arms folded over chest x 10, with hands behind neck for pec stretch x 10 (Airex pad on seat of chair to raise her up and feet resting on 4" step) Seated L/R UT stretch - gentle lateral lean with hand under seat (elevated with Airex pad due pt having long arms) 2 x 30" Seated L/R LS stretch - gentle lateral lean with hand under seat (elevated with Airex pad due pt having long arms) 2 x 30" Seated scap retraction into pool noodle on back of chair 10 x 3-5" Seated YTB scap retraction into pool noodle + B shoulder horiz ABD x 10 Seated YTB scap retraction into pool noodle + B shoulder horiz ABD diagonals (small angle) x 10 Seated YTB scap retraction into pool noodle + B shoulder ER x 10   04/25/23 THERAPEUTIC EXERCISE: to improve flexibility, strength and mobility.  Verbal and tactile cues throughout for technique.  UBE: L1.0 x 5 min (backwards)  Seated thoracic extension over back of chair with arms folded over chest x 10, with hands behind neck for pec stretch x 10 (Airex pad on seat of chair to raise her up and feet resting on 4" step) Seated scap retraction into pool noodle on back of chair 10 x 3-5" Seated YTB scap retraction into pool noodle + B shoulder horiz ABD x 10 Seated YTB scap retraction into pool noodle + B shoulder horiz ABD diagonals x 10 Standing leaning over orange Pball on mat table: Cervical retraction + thoracic extension + B bent arm row x 10 Cervical retraction + thoracic extension + B shoulder extension x 10  MANUAL THERAPY: To promote normalized  muscle tension, improved flexibility, improved joint mobility, increased ROM, and reduced pain.  STM/DTM, manual TPR to L UT, LS and medial periscapular muscles   PATIENT EDUCATION:  Education details: progress with PT and ongoing PT POC  Person educated: Patient Education method: Explanation Education comprehension: verbalized understanding  HOME EXERCISE PROGRAM: Access Code: [email or web address] URL:  [email or web address] Date: 04/27/2023 Prepared by: Glenetta Hew  Exercises - Seated Cervical Retraction  - 2-3 x daily - 7 x weekly - 2 sets - 10 reps - 3-5 sec hold - Shoulder Rolls in Sitting  - 2-3 x daily - 7 x weekly - 2 sets - 10 reps - 3 sec hold - Seated Scapular Retraction  - 2-3 x daily - 7 x weekly - 2 sets - 10 reps - 5 sec hold - Sternocleidomastoid Stretch  - 2-3 x daily - 7 x weekly - 3 reps - 30 sec hold - Standing Bilateral Low Shoulder Row with Anchored Resistance  - 1 x daily - 3-4 x weekly - 2 sets - 10 reps - 5 sec hold - Scapular Retraction with Resistance Advanced  - 1 x daily - 3-4 x weekly - 2 sets - 10 reps - 5 sec hold - Seated Shoulder Flexion Full Range (Mirrored)  - 1 x daily - 3-4 x weekly - 2 sets - 10 reps - 3 sec hold - Seated Single Arm Shoulder Abduction - Thumb Up  - 1 x daily - 3-4 x weekly - 2 sets - 10 reps - 3 sec hold - Shoulder Internal Rotation with Resistance (Mirrored)  - 1 x daily - 3 x weekly - 1-2 sets - 10 reps - 3 sec hold - Shoulder External Rotation with Anchored Resistance  - 1 x daily - 3 x weekly - 1-2 sets - 10 reps - 3 sec hold - Standing Isometric Cervical Sidebending with Manual Resistance  - 1 x daily - 7 x weekly - 1 sets - 5 reps - 10 sec hold hold - Standing Isometric Cervical Flexion with Manual Resistance  - 1 x daily - 7 x weekly - 1 sets - 5 reps - 10 sec hold - Standing Isometric Cervical Extension with Manual Resistance  - 1 x daily - 7 x weekly - 1 sets - 5 reps - 10 sec hold - Seated Isometric Cervical Rotation  - 1 x daily - 7 x weekly - 1 sets - 5 reps - 10 sec hold - Seated Shoulder Horizontal Abduction with Resistance  - 1 x daily - 3-4 x weekly - 2 sets - 10 reps - 3 sec hold   ASSESSMENT:  CLINICAL IMPRESSION: Stephanie [name] continues to report intermittent "constant" (steady) pain and expresses frustration and her inability to get consistent relief from pain. She is able to demonstrate continued improvement  in cervical ROM in most planes, although still with increased neck pain for all motions except flexion and L rotation. R shoulder ROM also improving and now WNL with less change noted on L, although this may be in part due to her recent reverse TSA as well as her L neck and periscapular pain.  Therapeutic exercises continue to target postural stability and scapular strengthening/stabilization, with tolerance limited at times due to increased pain. Continued to emphasize avoiding forcing painful motions, with exercises modified or discontinued as needed due to pain. More difficulty noted today with pain in lateral L periscapular muscle and posterior deltoids during exercises with MT addressing these concerns. Pt slowly progressing toward goals with  ROM and function but remains limited by ongoing pain.  Stephanie [name] will benefit from skilled PT to address above deficits to improve mobility and activity tolerance with decreased pain interference.  OBJECTIVE IMPAIRMENTS: decreased activity tolerance, decreased endurance, decreased knowledge of condition, decreased mobility, difficulty walking, decreased ROM, decreased strength, hypomobility, increased fascial restrictions, impaired perceived functional ability, increased muscle spasms, impaired flexibility, impaired UE functional use, improper body mechanics, postural dysfunction, and pain.   ACTIVITY LIMITATIONS: carrying, lifting, standing, sleeping, transfers, bed mobility, bathing, toileting, dressing, reach over head, and hygiene/grooming  PARTICIPATION LIMITATIONS: meal prep, cleaning, laundry, driving, shopping, community activity, occupation, yard work, and church  PERSONAL FACTORS: Age, Fitness, Past/current experiences, Time since onset of injury/illness/exacerbation, and 3+ comorbidities: Chronic neck pain, L shoulder advanced OA s/p reverse TSA on 10/14/22; R TKA 07/2019, B THR 2018 & 2017, posterior C1-2 cervical fusion 2018, rupture of flexor tendon of L  hand with failed surgical repair 2020, Parkinson's, psoriatic arthritis, bladder cancer, HTN    are also affecting patient's functional outcome.   REHAB POTENTIAL: Good  CLINICAL DECISION MAKING: Evolving/moderate complexity  EVALUATION COMPLEXITY: Moderate   GOALS: Goals reviewed with patient? Yes  SHORT TERM GOALS: Target date: 03/21/2023, extended to 05/03/2023   Patient will be independent with initial HEP to improve outcomes and carryover.  Baseline: Established 03/03/23 Goal status: MET  03/31/23  2.  Patient will report 25% improvement in neck pain to improve QOL.   Baseline: 8-10/10 Goal status: PARTIALLY MET  05/03/23 - minimal to no change in pain per pt report; pain ratings averaging from 3-4/10 up to 5-6/10 in recent visits as compared to 8-10/10 at eval  LONG TERM GOALS: Target date: 04/18/2023, extended to 05/24/2023   Patient will be independent with ongoing/advanced HEP for self-management at home.  Baseline:  Goal status: PARTIALLY MET  05/03/23 - Met for current HEP  2.  Patient will demonstrate improved posture to decrease muscle imbalance. Baseline: rounded shoulders, forward head, weight shift right, and head laterally shifted to R Goal status: IN PROGRESS  05/03/23 - still with significant forward head and flexed cervical and upper thoracic posture  3.  Patient will report 50-75% improvement in neck pain to improve QOL.  Baseline: 8-10/10 Goal status: IN PROGRESS  05/03/23 - minimal to no change in pain per pt report; pain ratings averaging 5-6/10 in recent visits as compared to 8-10/10 at eval  4.  Patient will demonstrate functional pain free cervical ROM for safety with driving.  Baseline: refer to above cervical ROM table Goal status: IN PROGRESS  05/03/23 - ROM improving in most planes but still painful for al directions except flexion and L rotation  5.  Patient will report </= 37% on NDI to demonstrate improved functional ability.  Baseline: 26 / 50 = 52.0  % Goal status: MET  04/12/23 - 17 / 50 = 34.0 %  6  Patient will report >/= 50% improvement in her sleep due to decreased neck pain. Baseline: Sleep greatly disturbed for up to 3-5 hrs due to neck pain per NDI Goal status: PARTIALLY MET  04/12/23 - Sleep mildly disturbed for up to 1-2 hrs per NDI  7.  Patient will be able to resume attending church services w/o limitation due to neck pain. Baseline: Has been unable to attend church Goal status: IN PROGRESS  04/25/23 - She has not been able to return to church d/t limited unsupported sitting tolerance as well as increased pain as she tries to turn her  head to observe the services  8. Patient will be able to resume walking for exercise w/o limitation due to neck pain. Baseline: Has been unable to walk d/t pain Goal status: PARTIALLY MET  05/03/23 - She has attempting walking for exercise but tolerance remains variable d/t neck pain - she was able take a longer walk yesterday at a slow pace   PLAN:  PT FREQUENCY: 2x/week  PT DURATION: 8 weeks  PLANNED INTERVENTIONS: Therapeutic exercises, Therapeutic activity, Neuromuscular re-education, Balance training, Patient/Family education, Self Care, Joint mobilization, Dry Needling, Electrical stimulation, Spinal mobilization, Cryotherapy, Moist heat, Taping, Ultrasound, Ionotophoresis 4mg /ml Dexamethasone, Manual therapy, and Re-evaluation  PLAN FOR NEXT SESSION: gently progress isometrics and/or gravity resisted cervical motion, cervical flexibility and ROM, postural correction and strengthening - thoracic extension and scapular retraction as tolerated; review and progress HEP as able; MT +/- DN as indicated and benefit noted   Marry Guan, PT 05/03/2023, 12:29 PM

## 2023-05-05 ENCOUNTER — Encounter: Payer: Medicare Other | Admitting: Physical Therapy

## 2023-05-05 ENCOUNTER — Ambulatory Visit (INDEPENDENT_AMBULATORY_CARE_PROVIDER_SITE_OTHER): Payer: Medicare Other | Admitting: *Deleted

## 2023-05-05 DIAGNOSIS — Z0000 Encounter for general adult medical examination without abnormal findings: Secondary | ICD-10-CM

## 2023-05-05 DIAGNOSIS — M47812 Spondylosis without myelopathy or radiculopathy, cervical region: Secondary | ICD-10-CM | POA: Diagnosis not present

## 2023-05-05 DIAGNOSIS — M791 Myalgia, unspecified site: Secondary | ICD-10-CM | POA: Diagnosis not present

## 2023-05-05 NOTE — Progress Notes (Signed)
Subjective:   Stephanie [name] is a 89 y.o. female who presents for Medicare Annual (Subsequent) preventive examination.  Visit Complete: Virtual  I connected with  [name] on 05/05/23 by a audio enabled telemedicine application and verified that I am speaking with the correct person using two identifiers.  Patient Location: Home  Provider Location: Office/Clinic  I discussed the limitations of evaluation and management by telemedicine. The patient expressed understanding and agreed to proceed.  Patient Medicare AWV questionnaire was completed by the patient on 04/28/23; I have confirmed that all information answered by patient is correct and no changes since this date.  Review of Systems     Cardiac Risk Factors include: advanced age (>61men, >34 women);dyslipidemia;hypertension     Objective:    Today's Vitals   There is no height or weight on file to calculate BMI.     05/05/2023    2:03 PM 02/21/2023   10:23 AM 11/22/2022    2:03 PM 11/12/2022    4:21 PM 10/14/2022    8:07 AM 10/01/2022    1:25 PM 08/25/2022   11:48 AM  Advanced Directives  Does Patient Have a Medical Advance Directive? Yes Yes No No Yes Yes Yes  Type of Estate agent of [place];Living will Healthcare Power of [place];Living will Healthcare Power of [place];Living will  Healthcare Power of [place];Living will Living will;Healthcare Power of [employer] Power of [place];Living will  Does patient want to make changes to medical advance directive? No - Patient declined No - Patient declined   No - Patient declined    Copy of Healthcare Power of Attorney in Chart? Yes - validated most recent copy scanned in chart (See row information) Yes - validated most recent copy scanned in chart (See row information) Yes - validated most recent copy scanned in chart (See row information)  No - copy requested  Yes - validated most recent copy scanned in chart (See row information)   Would patient like information on creating a medical advance directive?    No - Patient declined       Current Medications (verified) Outpatient Encounter Medications as of 05/05/2023  Medication Sig   amLODipine (NORVASC) 5 MG tablet TAKE 1 TABLET (5 MG TOTAL) BY MOUTH DAILY.   Apoaequorin (PREVAGEN PO) Take 1 tablet by mouth in the morning.   carbidopa-levodopa (SINEMET IR) 25-100 MG per tablet Take 0.5-1 tablets by mouth See admin instructions. Take 1 tablet by mouth in the morning upon waking & take 0.5 tablet by mouth every 2 hours thereafter for a total of 6 tablets daily.   Carbidopa-Levodopa ER (SINEMET CR) 25-100 MG tablet controlled release Take 1 tablet by mouth at bedtime.   cholecalciferol (VITAMIN D3) 25 MCG (1000 UNIT) tablet Take 1,000 Units by mouth in the morning.   cholestyramine (QUESTRAN) 4 g packet Take 1 packet (4 g total) by mouth 2 (two) times daily as needed.   Coenzyme Q10 (COQ10 PO) Take 1 tablet by mouth in the morning.   diazepam (VALIUM) 5 MG tablet TAKE 1 TABLET (5 MG TOTAL) BY MOUTH EVERY 12 (TWELVE) HOURS AS NEEDED. FOR ANXIETY   DIGESTIVE ENZYMES PO Take 1 capsule by mouth 3 (three) times daily before meals. Active Enzymes by Leotis Shames   EPINEPHrine 0.3 mg/0.3 mL IJ SOAJ injection Inject 0.3 mg into the muscle as needed for anaphylaxis.   ezetimibe (ZETIA) 10 MG tablet Take 1 tablet (10 mg total) by mouth daily.   fluticasone (  CUTIVATE) 0.05 % cream Apply 1 application topically daily as needed (scalp psoriasis).   golimumab (SIMPONI ARIA) 50 MG/4ML SOLN injection Inject 50 mg into the vein See admin instructions. Given every 2 months - Last given 06/28/2019   hydrochlorothiazide (HYDRODIURIL) 25 MG tablet Take 1 tablet (25 mg total) by mouth daily.   melatonin 5 MG TABS Take 5 mg by mouth at bedtime.   meloxicam (MOBIC) 15 MG tablet Take 15 mg by mouth daily as needed (joint pain (max 3-4 times/weekly)).   Multiple Vitamin (MULTIVITAMIN WITH MINERALS)  TABS tablet Take 1 tablet by mouth in the morning. Centrum Silver   Polyethyl Glycol-Propyl Glycol (SYSTANE) 0.4-0.3 % SOLN Place 1-2 drops into both eyes 3 (three) times daily as needed (dry/irritated eyes.).   Probiotic Product (PROBIOTIC PO) Take 1 capsule by mouth in the morning and at bedtime. Restore Ultimate Probiotic   rosuvastatin (CRESTOR) 5 MG tablet Take 1 tablet (5 mg total) by mouth 2 (two) times a week.   tiZANidine (ZANAFLEX) 4 MG tablet Take 0.5-1 tablets (2-4 mg total) by mouth every 8 (eight) hours as needed for muscle spasms.   [DISCONTINUED] metoprolol tartrate (LOPRESSOR) 100 MG tablet Take 1 tablet (100 mg total) by mouth once for 1 dose. Take 90-120 minutes prior to scan.   [DISCONTINUED] sulfamethoxazole-trimethoprim (BACTRIM DS) 800-160 MG tablet Take 1 tablet by mouth 2 (two) times daily.   No facility-administered encounter medications on file as of 05/05/2023.    Allergies (verified) Cleocin [clindamycin], Codeine, Hornet venom, Penicillins, Vibramycin [doxycycline], Yellow jacket venom, Lodine [etodolac], and Tramadol   History: Past Medical History:  Diagnosis Date   Allergy    Anemia    past hx    Arthropathy of cervical spine 11/27/2012   Right  Xray at [facility]  On 04/08/2016  AP, lateral, and lateral flexion and extension views of the cervical spine are submitted for evaluation.   No acute fracture identified in the cervical spine.   There is redemonstration of approximately 2 mm of C3 on C4 anterolisthesis in neutral which slightly increases in flexion relative to neutral and extension. Slight C5 on C6 retrolisthesis does not change i   Benign hypertension without congestive heart failure    Benign mole    right hcets mole, bleeds when dried with towel   Bilateral dry eyes    Bladder cancer (HCC) 10/18/2016   CAD (coronary artery disease), native coronary artery 04/2023   Coronary CTA showed a coronary calcium score to 46 with less than 25% left main, 25 to  49% proximal LAD, less than 25% left circumflex stenosis   Cataract    Cervical spondylosis    Cervicalgia    2 screws in place    Chronic kidney disease    Depression with anxiety 10/18/16   prior to husbands death    Dyspnea    Gross hematuria    developed after first hip replacment, led to indicental finding of bladder tumor, bleeding now resolved    H/O total shoulder replacement, left    History of kidney stones    1970's   Hyperlipidemia    Left hand weakness    due to reinjury of flxor tendon s/p tendon surgery march 12-2018   Lumbar stenosis    Malignant tumor of urinary bladder (HCC) 07/2016   Pre-stage I   OA (osteoarthritis)    Parkinson disease dx 2012   neurologist-  dr siddiqui at [facility]   Psoriatic arthritis [facility])  Dr Nickola Major , [place] [facility]    Rupture of flexor tendon of hand 2020   right   Past Surgical History:  Procedure Laterality Date   BIOPSY MASS LEFT FIRST METACARPAL   06/23/2006   benign   CATARACT EXTRACTION W/ INTRAOCULAR LENS  IMPLANT, BILATERAL  1999 and 2003   CERVICAL FUSION  02/2017   c1-c2 ; reports she has 2 screws 2in long in place done at [facility] ; patient exhibits VERY LIMITED NECK ROM   COLONOSCOPY     COSMETIC SURGERY     CYSTOSCOPY W/ RETROGRADES Bilateral 09/22/2016   Procedure: CYSTOSCOPY WITH RETROGRADE PYELOGRAM;  Surgeon: Sebastian Ache, MD;  Location: [facility];  Service: Urology;  Laterality: Bilateral;   D & C HYSTEROSCOPY W/ RESECTION POLYP  07/11/2000   DILATION AND CURETTAGE OF UTERUS  1960's   EXCISION CYST AND DEBRIDEMENT RIGHT WIRST AND REMOVAL FORGEIGN BODY  01/09/2009   EYE SURGERY     FLEXOR TENDON REPAIR Left 12/12/2018   Procedure: REPAIR/TRANSFER FLEXOR DIGITORUM PROFUNDUS OF LEFT SMALL FINGER;  Surgeon: Cindee Salt, MD;  Location: [facility];  Service: Orthopedics;  Laterality: Left;   JOINT REPLACEMENT     LAPAROSCOPY  yrs ago   infertility    METACARPOPHALANGEAL  JOINT ARTHRODESIS Right 05/25/1996   REVERSE SHOULDER ARTHROPLASTY Left 10/14/2022   Procedure: REVERSE SHOULDER ARTHROPLASTY;  Surgeon: Francena Hanly, MD;  Location: [facility];  Service: Orthopedics;  Laterality: Left;    SPINE SURGERY     TENDON REPAIR Left 01/15/2019   Hand   TONSILLECTOMY AND ADENOIDECTOMY  child   TOTAL HIP ARTHROPLASTY Left 07/07/2016   Procedure: LEFT TOTAL HIP ARTHROPLASTY ANTERIOR APPROACH;  Surgeon: Ollen Gross, MD;  Location: [facility];  Service: Orthopedics;  Laterality: Left;   TOTAL HIP ARTHROPLASTY Right 09/28/2017   Procedure: RIGHT TOTAL HIP ARTHROPLASTY ANTERIOR APPROACH;  Surgeon: Ollen Gross, MD;  Location: [facility];  Service: Orthopedics;  Laterality: Right;   TOTAL KNEE ARTHROPLASTY Right 07/30/2019   Procedure: TOTAL KNEE ARTHROPLASTY;  Surgeon: Ollen Gross, MD;  Location: [facility];  Service: Orthopedics;  Laterality: Right;    TRANSURETHRAL RESECTION OF BLADDER TUMOR N/A 09/22/2016   Procedure: TRANSURETHRAL RESECTION OF BLADDER TUMOR (TURBT);  Surgeon: Sebastian Ache, MD;  Location: [facility];  Service: Urology;  Laterality: N/A;   Family History  Problem Relation Age of Onset   Arthritis Mother    Hypertension Mother    Deep vein thrombosis Mother        recurrent, secondary to Bleeding disorder   Arthritis Father    Stroke Father    Bladder Cancer Father    Diabetes Sister    Heart disease Sister    Obesity Sister    Arthritis Sister    Hypertension Sister    Heart disease Maternal Grandmother    Heart disease Maternal Grandfather    Peripheral vascular disease Paternal Grandmother        s/p leg amputation   Stroke Paternal Grandfather    Esophageal cancer Paternal Aunt    Hearing loss Sister    Colon cancer Neg Hx    Colon polyps Neg Hx    Rectal cancer Neg Hx    Stomach cancer Neg Hx    Social History   Socioeconomic History   Marital status: Widowed    Spouse name: Not on file   Number of  children: Not on file   Years of education: Not on file  Highest education level: Bachelor's degree (e.g., BA, AB, BS)  Occupational History   Not on file  Tobacco Use   Smoking status: Former    Years: 10    Types: Cigarettes    Quit date: 05/04/1964    Years since quitting: 59.0   Smokeless tobacco: Never   Tobacco comments:    Pt states that when she was smoking 1 pack of cigs would last her 3 days. 10/13/22 ALS   Vaping Use   Vaping Use: Never used  Substance and Sexual Activity   Alcohol use: Yes    Alcohol/week: 7.0 standard drinks of alcohol    Types: 7 Glasses of wine per week    Comment: ocassionally- social    Drug use: No   Sexual activity: Not Currently    Partners: Male  Other Topics Concern   Not on file  Social History Narrative   Not on file   Social Determinants of Health   Financial Resource Strain: Low Risk  (04/28/2023)   Overall Financial Resource Strain (CARDIA)    Difficulty of Paying Living Expenses: Not hard at all  Food Insecurity: No Food Insecurity (04/28/2023)   Hunger Vital Sign    Worried About Running Out of Food in the Last Year: Never true    Ran Out of Food in the Last Year: Never true  Transportation Needs: No Transportation Needs (04/28/2023)   PRAPARE - Administrator, Civil Service (Medical): No    Lack of Transportation (Non-Medical): No  Physical Activity: Insufficiently Active (04/28/2023)   Exercise Vital Sign    Days of Exercise per Week: 1 day    Minutes of Exercise per Session: 20 min  Stress: No Stress Concern Present (04/28/2023)   [employer] of Occupational Health - Occupational Stress Questionnaire    Feeling of Stress : Not at all  Recent Concern: Stress - Stress Concern Present (03/29/2023)   [employer] of Occupational Health - Occupational Stress Questionnaire    Feeling of Stress : To some extent  Social Connections: Moderately Integrated (04/28/2023)   Social Connection and Isolation Panel  [NHANES]    Frequency of Communication with Friends and Family: More than three times a week    Frequency of Social Gatherings with Friends and Family: Once a week    Attends Religious Services: 1 to 4 times per year    Active Member of [employer] or Organizations: Yes    Attends Banker Meetings: 1 to 4 times per year    Marital Status: Widowed    Tobacco Counseling Counseling given: Not Answered Tobacco comments: Pt states that when she was smoking 1 pack of cigs would last her 3 days. 10/13/22 ALS    Clinical Intake:  Pre-visit preparation completed: Yes  Pain : No/denies pain  Nutritional Risks: None Diabetes: No  How often do you need to have someone help you when you read instructions, pamphlets, or other written materials from your doctor or pharmacy?: 1 - Never  Interpreter Needed?: No  Information entered by :: Donne Anon, CMA   Activities of Daily Living    04/28/2023    9:41 AM 10/14/2022    1:55 PM  In your present state of health, do you have any difficulty performing the following activities:  Hearing? 1   Comment wears hearing aids   Vision? 0   Difficulty concentrating or making decisions? 0   Walking or climbing stairs? 0   Dressing or bathing? 0   Doing  errands, shopping? 0 0  Preparing Food and eating ? N   Using the Toilet? N   In the past six months, have you accidently leaked urine? Y   Do you have problems with loss of bowel control? Y   Managing your Medications? N   Managing your Finances? N   Housekeeping or managing your Housekeeping? N     Patient Care Team: Meriam Sprague, MD as PCP - General (Cardiology) Quintella Reichert, MD as PCP - Cardiology (Cardiology) Jerene Bears, MD as Consulting Physician (Gynecology) Jaquita Folds, MD as Referring Physician (Neurology) Berneice Heinrich Delbert Phenix., MD as Consulting Physician (Urology) Dione Booze, Bertram Millard, MD as Consulting Physician (Ophthalmology) Brain Hilts, DC  (Chiropractic Medicine) Elmon Else, MD as Consulting Physician (Dermatology) Day, Dannielle Karvonen, [facility] (Inactive) as Pharmacist (Pharmacist)  Indicate any recent Medical Services you may have received from other than [facility] providers in the past year (date may be approximate).     Assessment:   This is a routine wellness examination for Aydah.  Hearing/Vision screen No results found.  Dietary issues and exercise activities discussed:     Goals Addressed   None    Depression Screen    05/05/2023    2:33 PM 03/29/2023   11:19 AM 02/28/2023   10:15 AM 02/14/2023   11:12 AM 11/29/2022   10:59 AM 11/01/2022    1:13 PM 09/27/2022   10:16 AM  PHQ 2/9 Scores  PHQ - 2 Score 0 2 0 2 0 2 2  PHQ- 9 Score  6 0 7  5 5     Fall Risk    04/28/2023    9:41 AM 02/28/2023   10:15 AM 11/29/2022   10:59 AM 08/02/2022   10:07 AM 05/03/2022   11:07 AM  Fall Risk   Falls in the past year? 0 0 0 0 0  Number falls in past yr: 0 0 0 0 0  Injury with Fall? 0 0 0 0 0  Risk for fall due to : No Fall Risks    No Fall Risks  Follow up Falls evaluation completed Falls evaluation completed Falls evaluation completed Falls evaluation completed Falls evaluation completed    MEDICARE RISK AT HOME:  Medicare Risk at Home - 05/05/23 1437     Any stairs in or around the home? Yes    If so, are there any without handrails? No    Home free of loose throw rugs in walkways, pet beds, electrical cords, etc? Yes    Adequate lighting in your home to reduce risk of falls? Yes    Life alert? No    Use of a cane, walker or w/c? No    Grab bars in the bathroom? Yes    Shower chair or bench in shower? Yes    Elevated toilet seat or a handicapped toilet? Yes             TIMED UP AND GO:  Was the test performed?  No    Cognitive Function:    10/29/2016   10:43 AM  MMSE - Mini Mental State Exam  Orientation to time 5  Orientation to Place 5  Registration 3  Attention/ Calculation 5  Recall 3  Language- name  2 objects 2  Language- repeat 1  Language- follow 3 step command 3  Language- read & follow direction 1  Write a sentence 1  Copy design 0  Total score 29        05/05/2023  2:39 PM  6CIT Screen  What Year? 0 points  What month? 0 points  What time? 0 points  Count back from 20 0 points  Months in reverse 0 points  Repeat phrase 0 points  Total Score 0 points    Immunizations Immunization History  Administered Date(s) Administered   Fluad Quad(high Dose 65+) 07/30/2022   Influenza Split 06/29/2019   Influenza Whole 08/15/2012   Influenza, High Dose Seasonal PF 08/19/2015, 08/27/2017, 08/15/2018, 07/12/2020, 07/29/2021   Influenza,inj,quad, With Preservative 08/15/2017, 08/15/2018   Influenza-Unspecified 08/15/2014, 08/24/2016, 08/15/2018   PFIZER Comirnaty(Gray Top)Covid-19 Tri-Sucrose Vaccine 05/15/2021   PFIZER(Purple Top)SARS-COV-2 Vaccination 11/25/2019, 12/16/2019, 08/19/2020   Pfizer Covid-19 Vaccine Bivalent Booster 53yrs & up 10/14/2021   Pneumococcal Conjugate PCV 7 11/18/2014   Pneumococcal Conjugate-13 12/11/2014   Pneumococcal Polysaccharide-23 11/24/2011   Pneumococcal-Unspecified 11/18/2014   Td 02/25/2014   Unspecified SARS-COV-2 Vaccination 08/06/2022   Zoster Recombinat (Shingrix) 01/20/2018, 04/07/2018   Zoster, Live 08/27/2011   Zoster, Unspecified 04/07/2018    TDAP status: Up to date  Flu Vaccine status: Up to date  Pneumococcal vaccine status: Up to date  Covid-19 vaccine status: Information provided on how to obtain vaccines.   Qualifies for Shingles Vaccine? Yes   Zostavax completed Yes   Shingrix Completed?: Yes  Screening Tests Health Maintenance  Topic Date Due   COVID-19 Vaccine (7 - 2023-24 season) 06/10/2023 (Originally 10/01/2022)   MAMMOGRAM  05/13/2023   INFLUENZA VACCINE  06/16/2023   DTaP/Tdap/Td (2 - Tdap) 02/26/2024   Medicare Annual Wellness (AWV)  05/04/2024   Pneumonia Vaccine 69+ Years old  Completed   DEXA  SCAN  Completed   Zoster Vaccines- Shingrix  Completed   HPV VACCINES  Aged Out    Health Maintenance  There are no preventive care reminders to display for this patient.   Colorectal cancer screening: No longer required.   Mammogram status: Completed 05/12/22. Repeat every year  Bone Density status: Completed 05/06/21. Results reflect: Bone density results: NORMAL. Repeat every 2 years.  Lung Cancer Screening: (Low Dose CT Chest recommended if Age 30-80 years, 20 pack-year currently smoking OR have quit w/in 15years.) does not qualify.   Additional Screening:  Hepatitis C Screening: does not qualify  Vision Screening: Recommended annual ophthalmology exams for early detection of glaucoma and other disorders of the eye. Is the patient up to date with their annual eye exam?  Yes  Who is the provider or what is the name of the office in which the patient attends annual eye exams? Dr.Robert Groat If pt is not established with a provider, would they like to be referred to a provider to establish care? No .   Dental Screening: Recommended annual dental exams for proper oral hygiene  Diabetic Foot Exam: N/a  Community Resource Referral / Chronic Care Management: CRR required this visit?  No   CCM required this visit?  No     Plan:     I have personally reviewed and noted the following in the patient's chart:   Medical and social history Use of alcohol, tobacco or illicit drugs  Current medications and supplements including opioid prescriptions. Patient is not currently taking opioid prescriptions. Functional ability and status Nutritional status Physical activity Advanced directives List of other physicians Hospitalizations, surgeries, and ER visits in previous 12 months Vitals Screenings to include cognitive, depression, and falls Referrals and appointments  In addition, I have reviewed and discussed with patient certain preventive protocols, quality metrics, and best  practice recommendations.  A written personalized care plan for preventive services as well as general preventive health recommendations were provided to patient.     Donne Anon, CMA   05/05/2023   After Visit Summary: (MyChart) Due to this being a telephonic visit, the after visit summary with patients personalized plan was offered to patient via MyChart   Nurse Notes: None

## 2023-05-05 NOTE — Patient Instructions (Signed)
Stephanie [name] , Thank you for taking time to come for your Medicare Wellness Visit. I appreciate your ongoing commitment to your health goals. Please review the following plan we discussed and let me know if I can assist you in the future.     This is a list of the screening recommended for you and due dates:  Health Maintenance  Topic Date Due   COVID-19 Vaccine (7 - 2023-24 season) 06/10/2023*   Mammogram  05/13/2023   Flu Shot  06/16/2023   DTaP/Tdap/Td vaccine (2 - Tdap) 02/26/2024   Medicare Annual Wellness Visit  05/04/2024   Pneumonia Vaccine  Completed   DEXA scan (bone density measurement)  Completed   Zoster (Shingles) Vaccine  Completed   HPV Vaccine  Aged Out  *Topic was postponed. The date shown is not the original due date.    Next appointment: Follow up in one year for your annual wellness visit.   Preventive Care 35 Years and Older, Female Preventive care refers to lifestyle choices and visits with your health care provider that can promote health and wellness. What does preventive care include? A yearly physical exam. This is also called an annual well check. Dental exams once or twice a year. Routine eye exams. Ask your health care provider how often you should have your eyes checked. Personal lifestyle choices, including: Daily care of your teeth and gums. Regular physical activity. Eating a healthy diet. Avoiding tobacco and drug use. Limiting alcohol use. Practicing safe sex. Taking low-dose aspirin every day. Taking vitamin and mineral supplements as recommended by your health care provider. What happens during an annual well check? The services and screenings done by your health care provider during your annual well check will depend on your age, overall health, lifestyle risk factors, and family history of disease. Counseling  Your health care provider may ask you questions about your: Alcohol use. Tobacco use. Drug use. Emotional well-being. Home  and relationship well-being. Sexual activity. Eating habits. History of falls. Memory and ability to understand (cognition). Work and work Astronomer. Reproductive health. Screening  You may have the following tests or measurements: Height, weight, and BMI. Blood pressure. Lipid and cholesterol levels. These may be checked every 5 years, or more frequently if you are over 61 years old. Skin check. Lung cancer screening. You may have this screening every year starting at age 64 if you have a 30-pack-year history of smoking and currently smoke or have quit within the past 15 years. Fecal occult blood test (FOBT) of the stool. You may have this test every year starting at age 46. Flexible sigmoidoscopy or colonoscopy. You may have a sigmoidoscopy every 5 years or a colonoscopy every 10 years starting at age 104. Hepatitis C blood test. Hepatitis B blood test. Sexually transmitted disease (STD) testing. Diabetes screening. This is done by checking your blood sugar (glucose) after you have not eaten for a while (fasting). You may have this done every 1-3 years. Bone density scan. This is done to screen for osteoporosis. You may have this done starting at age 1. Mammogram. This may be done every 1-2 years. Talk to your health care provider about how often you should have regular mammograms. Talk with your health care provider about your test results, treatment options, and if necessary, the need for more tests. Vaccines  Your health care provider may recommend certain vaccines, such as: Influenza vaccine. This is recommended every year. Tetanus, diphtheria, and acellular pertussis (Tdap, Td) vaccine. You may need  a Td booster every 10 years. Zoster vaccine. You may need this after age 47. Pneumococcal 13-valent conjugate (PCV13) vaccine. One dose is recommended after age 67. Pneumococcal polysaccharide (PPSV23) vaccine. One dose is recommended after age 48. Talk to your health care provider  about which screenings and vaccines you need and how often you need them. This information is not intended to replace advice given to you by your health care provider. Make sure you discuss any questions you have with your health care provider. Document Released: 11/28/2015 Document Revised: 07/21/2016 Document Reviewed: 09/02/2015 Elsevier Interactive Patient Education  2017 [employer].  Fall Prevention in the Home Falls can cause injuries. They can happen to people of all ages. There are many things you can do to make your home safe and to help prevent falls. What can I do on the outside of my home? Regularly fix the edges of walkways and driveways and fix any cracks. Remove anything that might make you trip as you walk through a door, such as a raised step or threshold. Trim any bushes or trees on the path to your home. Use bright outdoor lighting. Clear any walking paths of anything that might make someone trip, such as rocks or tools. Regularly check to see if handrails are loose or broken. Make sure that both sides of any steps have handrails. Any raised decks and porches should have guardrails on the edges. Have any leaves, snow, or ice cleared regularly. Use sand or salt on walking paths during winter. Clean up any spills in your garage right away. This includes oil or grease spills. What can I do in the bathroom? Use night lights. Install grab bars by the toilet and in the tub and shower. Do not use towel bars as grab bars. Use non-skid mats or decals in the tub or shower. If you need to sit down in the shower, use a plastic, non-slip stool. Keep the floor dry. Clean up any water that spills on the floor as soon as it happens. Remove soap buildup in the tub or shower regularly. Attach bath mats securely with double-sided non-slip rug tape. Do not have throw rugs and other things on the floor that can make you trip. What can I do in the bedroom? Use night lights. Make sure  that you have a light by your bed that is easy to reach. Do not use any sheets or blankets that are too big for your bed. They should not hang down onto the floor. Have a firm chair that has side arms. You can use this for support while you get dressed. Do not have throw rugs and other things on the floor that can make you trip. What can I do in the kitchen? Clean up any spills right away. Avoid walking on wet floors. Keep items that you use a lot in easy-to-reach places. If you need to reach something above you, use a strong step stool that has a grab bar. Keep electrical cords out of the way. Do not use floor polish or wax that makes floors slippery. If you must use wax, use non-skid floor wax. Do not have throw rugs and other things on the floor that can make you trip. What can I do with my stairs? Do not leave any items on the stairs. Make sure that there are handrails on both sides of the stairs and use them. Fix handrails that are broken or loose. Make sure that handrails are as long as the stairways. Check  any carpeting to make sure that it is firmly attached to the stairs. Fix any carpet that is loose or worn. Avoid having throw rugs at the top or bottom of the stairs. If you do have throw rugs, attach them to the floor with carpet tape. Make sure that you have a light switch at the top of the stairs and the bottom of the stairs. If you do not have them, ask someone to add them for you. What else can I do to help prevent falls? Wear shoes that: Do not have high heels. Have rubber bottoms. Are comfortable and fit you well. Are closed at the toe. Do not wear sandals. If you use a stepladder: Make sure that it is fully opened. Do not climb a closed stepladder. Make sure that both sides of the stepladder are locked into place. Ask someone to hold it for you, if possible. Clearly mark and make sure that you can see: Any grab bars or handrails. First and last steps. Where the edge of  each step is. Use tools that help you move around (mobility aids) if they are needed. These include: Canes. Walkers. Scooters. Crutches. Turn on the lights when you go into a dark area. Replace any light bulbs as soon as they burn out. Set up your furniture so you have a clear path. Avoid moving your furniture around. If any of your floors are uneven, fix them. If there are any pets around you, be aware of where they are. Review your medicines with your doctor. Some medicines can make you feel dizzy. This can increase your chance of falling. Ask your doctor what other things that you can do to help prevent falls. This information is not intended to replace advice given to you by your health care provider. Make sure you discuss any questions you have with your health care provider. Document Released: 08/28/2009 Document Revised: 04/08/2016 Document Reviewed: 12/06/2014 Elsevier Interactive Patient Education  2017 [employer].

## 2023-05-06 ENCOUNTER — Ambulatory Visit: Payer: Medicare Other | Admitting: Physical Therapy

## 2023-05-06 ENCOUNTER — Encounter: Payer: Self-pay | Admitting: Physical Therapy

## 2023-05-06 DIAGNOSIS — M542 Cervicalgia: Secondary | ICD-10-CM

## 2023-05-06 DIAGNOSIS — M62838 Other muscle spasm: Secondary | ICD-10-CM | POA: Diagnosis not present

## 2023-05-06 DIAGNOSIS — R293 Abnormal posture: Secondary | ICD-10-CM

## 2023-05-06 DIAGNOSIS — M6281 Muscle weakness (generalized): Secondary | ICD-10-CM | POA: Diagnosis not present

## 2023-05-06 NOTE — Therapy (Signed)
OUTPATIENT PHYSICAL THERAPY TREATMENT     Patient Name: Stephanie [name] MRN: [id number] DOB:[birth year], 89 y.o., female Today's Date: 05/06/2023   END OF SESSION:  PT End of Session - 05/06/23 1013     Visit Number 17    Number of Visits 20    Date for PT Re-Evaluation 05/24/23    Authorization Type Medicare & BCBS    Progress Note Due on Visit 20    PT Start Time 1013    PT Stop Time 1059    PT Time Calculation (min) 46 min    Activity Tolerance Patient tolerated treatment well    Behavior During Therapy WFL for tasks assessed/performed                Past Medical History:  Diagnosis Date   Allergy    Anemia    past hx    Arthropathy of cervical spine 11/27/2012   Right  Xray at [facility]  On 04/08/2016  AP, lateral, and lateral flexion and extension views of the cervical spine are submitted for evaluation.   No acute fracture identified in the cervical spine.   There is redemonstration of approximately 2 mm of C3 on C4 anterolisthesis in neutral which slightly increases in flexion relative to neutral and extension. Slight C5 on C6 retrolisthesis does not change i   Benign hypertension without congestive heart failure    Benign mole    right hcets mole, bleeds when dried with towel   Bilateral dry eyes    Bladder cancer (HCC) Oct 25, 2016   CAD (coronary artery disease), native coronary artery 04/2023   Coronary CTA showed a coronary calcium score to 46 with less than 25% left main, 25 to 49% proximal LAD, less than 25% left circumflex stenosis   Cataract    Cervical spondylosis    Cervicalgia    2 screws in place    Chronic kidney disease    Depression with anxiety October 25, 2016   prior to husbands death    Dyspnea    Gross hematuria    developed after first hip replacment, led to indicental finding of bladder tumor, bleeding now resolved    H/O total shoulder replacement, left    History of kidney stones    1970's   Hyperlipidemia    Left hand weakness    due to  reinjury of flxor tendon s/p tendon surgery march 12-2018   Lumbar stenosis    Malignant tumor of urinary bladder (HCC) 07/2016   Pre-stage I   OA (osteoarthritis)    Parkinson disease dx 2012   neurologist-  dr siddiqui at [facility]   Psoriatic arthritis [facility])     Dr Nickola Major , [place] [facility]    Rupture of flexor tendon of hand 2020   right   Past Surgical History:  Procedure Laterality Date   BIOPSY MASS LEFT FIRST METACARPAL   06/23/2006   benign   CATARACT EXTRACTION W/ INTRAOCULAR LENS  IMPLANT, BILATERAL  1999 and 2003   CERVICAL FUSION  02/2017   c1-c2 ; reports she has 2 screws 2in long in place done at [facility] ; patient exhibits VERY LIMITED NECK ROM   COLONOSCOPY     COSMETIC SURGERY     CYSTOSCOPY W/ RETROGRADES Bilateral 09/22/2016   Procedure: CYSTOSCOPY WITH RETROGRADE PYELOGRAM;  Surgeon: Sebastian Ache, MD;  Location: [facility];  Service: Urology;  Laterality: Bilateral;   D & C HYSTEROSCOPY W/ RESECTION POLYP  07/11/2000   DILATION AND CURETTAGE  OF UTERUS  1960's   EXCISION CYST AND DEBRIDEMENT RIGHT WIRST AND REMOVAL FORGEIGN BODY  01/09/2009   EYE SURGERY     FLEXOR TENDON REPAIR Left 12/12/2018   Procedure: REPAIR/TRANSFER FLEXOR DIGITORUM PROFUNDUS OF LEFT SMALL FINGER;  Surgeon: Cindee Salt, MD;  Location: [facility];  Service: Orthopedics;  Laterality: Left;   JOINT REPLACEMENT     LAPAROSCOPY  yrs ago   infertility    METACARPOPHALANGEAL JOINT ARTHRODESIS Right 05/25/1996   REVERSE SHOULDER ARTHROPLASTY Left 10/14/2022   Procedure: REVERSE SHOULDER ARTHROPLASTY;  Surgeon: Francena Hanly, MD;  Location: [facility];  Service: Orthopedics;  Laterality: Left;    SPINE SURGERY     TENDON REPAIR Left 01/15/2019   Hand   TONSILLECTOMY AND ADENOIDECTOMY  child   TOTAL HIP ARTHROPLASTY Left 07/07/2016   Procedure: LEFT TOTAL HIP ARTHROPLASTY ANTERIOR APPROACH;  Surgeon: Ollen Gross, MD;  Location: [facility];  Service:  Orthopedics;  Laterality: Left;   TOTAL HIP ARTHROPLASTY Right 09/28/2017   Procedure: RIGHT TOTAL HIP ARTHROPLASTY ANTERIOR APPROACH;  Surgeon: Ollen Gross, MD;  Location: [facility];  Service: Orthopedics;  Laterality: Right;   TOTAL KNEE ARTHROPLASTY Right 07/30/2019   Procedure: TOTAL KNEE ARTHROPLASTY;  Surgeon: Ollen Gross, MD;  Location: [facility];  Service: Orthopedics;  Laterality: Right;    TRANSURETHRAL RESECTION OF BLADDER TUMOR N/A 09/22/2016   Procedure: TRANSURETHRAL RESECTION OF BLADDER TUMOR (TURBT);  Surgeon: Sebastian Ache, MD;  Location: [facility];  Service: Urology;  Laterality: N/A;   Patient Active Problem List   Diagnosis Date Noted   CAD (coronary artery disease), native coronary artery 04/20/2023   S/P reverse total shoulder arthroplasty, left 10/14/2022   Preoperative cardiovascular examination 10/04/2022   SOB (shortness of breath) 08/02/2022   Insomnia 08/02/2022   Hyponatremia 08/01/2022   Shoulder pain, left 09/14/2021   History of COVID-19 01/06/2021   Diarrhea 09/03/2020   Vitamin D deficiency 09/03/2020   OA (osteoarthritis) of knee 07/30/2019   Tinea corporis 06/08/2019   Psoriatic arthritis (HCC) 04/02/2019   Anemia 03/13/2018   Hot flashes 03/13/2018   S/P cervical spinal fusion 03/31/2017   Neck pain 12/12/2016   Depression with anxiety 10/17/2016   Bladder cancer (HCC) 10/17/2016   OA (osteoarthritis) of hip 07/07/2016   Spinal stenosis of lumbar region with radiculopathy 04/08/2016   Spondylolisthesis of lumbar region 04/08/2016   Spondylosis of cervical region without myelopathy or radiculopathy 04/08/2016   Preventative health care 01/18/2016   Low back pain 01/18/2016   History of chicken pox    Hyperlipidemia 12/15/2014   Allergic rhinitis 06/25/2014   Allergy to bee sting 06/10/2014   TMJ tenderness 11/16/2013   Other malaise and fatigue 05/07/2013   Arthropathy of cervical spine (HCC) 11/27/2012   Muscle  spasms of neck 11/27/2012   Parkinson's disease 05/24/2012   Conjunctivochalasis 03/09/2012   Epiretinal membrane 03/09/2012   Hyperopia with astigmatism and presbyopia 03/09/2012   Pseudophakia 03/09/2012   HTN (hypertension) 08/31/2011   Right knee pain 08/31/2011   Benign hypertensive heart disease without heart failure 05/12/2011    PCP: Bradd Canary, MD   REFERRING PROVIDER: Romero Belling, MD   REFERRING DIAG: Multilevel cervical spondylosis, L neck & trapezial myofascial pain/contracture   THERAPY DIAG:  Cervicalgia  Abnormal posture  Muscle weakness (generalized)  Other muscle spasm  RATIONALE FOR EVALUATION AND TREATMENT: Rehabilitation  ONSET DATE: acute on chronic  NEXT MD VISIT: PRN   SUBJECTIVE:  SUBJECTIVE STATEMENT: Pt reports the MD gave her 2 injections yesterday - sore from the injections, but thinks they are helping.  She states that MD told her that neurosurgical intervention for her neck will not be possible and that pain needs to be managed with injections and PT exercises.  PAIN: Are you having pain? Yes: NPRS scale: 4/10 Pain location: L neck  Pain description: more "sore" than pain Aggravating factors: any movement of head and neck, dyskinesia from PD meds Relieving factors: sitting still in her recliner, TENS  PERTINENT HISTORY:  Chronic neck pain, L shoulder advanced OA s/p reverse TSA on 10/14/22; R TKA 07/2019, B THR 2018 & 2017, posterior C1-2 cervical fusion 2018, rupture of flexor tendon of L hand with failed surgical repair 2020, Parkinson's, psoriatic arthritis, bladder cancer, HTN    PRECAUTIONS: None  HAND DOMINANCE: Right  WEIGHT BEARING RESTRICTIONS: No  FALLS:  Has patient fallen in last 6 months? No  LIVING  ENVIRONMENT: Lives with: lives alone Lives in: House/apartment Stairs: Yes: External: 4 steps; on right going up, on left going up, and can reach both Has following equipment at home: Single point cane, Walker - 2 wheeled, Shower bench, bed side commode, and Grab bars  OCCUPATION: Retired  [employer]: Independent and Leisure: volunteering at [employer], bible study, walking 1/3 mile daily - unable recently due to pain   PATIENT GOALS: "Get rid of this pain so I can go back to my normal activities."   OBJECTIVE: (objective measures completed at initial evaluation unless otherwise dated)  DIAGNOSTIC FINDINGS:  Cervical MRI - results not available, but pt reports she was told she had severe OA  02/07/23 - Cervical spine x-ray: IMPRESSION: Multilevel degenerative change and postoperative change without acute abnormality.  PATIENT SURVEYS:  NDI 26 / 50 = 52.0 % ; 17 / 50 = 34.0 %  COGNITION: Overall cognitive status: Within functional limits for tasks assessed  SENSATION: WFL  POSTURE:  rounded shoulders, forward head, weight shift right, and head laterally shifted to R  PALPATION: Severe increased muscle tension and TTP in L>R UT, LS and cervical paraspinals as well as L periscapular muscles   CERVICAL ROM:   Active ROM eval 04/12/23 05/03/23  Flexion 46 41 42  Extension 22 * 30 * 53 *  Right lateral flexion 16 * 12 * 20 *  Left lateral flexion 10 * 20 * 29 *  Right rotation 16 * 35 * 50 *  Left rotation 18* 25 * 36   (Blank rows = not tested, * = pain) 02/21/23 - clicking with B rotation  UPPER EXTREMITY ROM:  Active ROM Right  eval Left    eval Right 04/12/23 Left 04/12/23 Right 05/03/23 Left 05/03/23  Shoulder flexion 146 111 156 135 162 129  Shoulder extension 50 42 55 38 56 42  Shoulder abduction 155 118 178 125 181 120  Shoulder adduction        Shoulder internal rotation FIR to sacrum FIR to lateral buttock FIR L5 FIR to lateral buttock FIR WNL FIR to lateral buttock   Shoulder external rotation FER T3 FER T1 FER T3 FER T1 FER T3 FER T2  Elbow flexion        Elbow extension        Wrist flexion        Wrist extension        Wrist ulnar deviation        Wrist radial deviation  Wrist pronation        Wrist supination         (Blank rows = not tested)  UPPER EXTREMITY MMT:   MMT Right 03/03/23 Left 03/03/23 Right 04/12/23 Left 04/12/23 Right 05/03/23 Left* 05/03/23  Shoulder flexion 4+ 4- 5 4, pain in scapula 5 4+  Shoulder extension 4+ 4+ 5 4+, clicking in neck 5 4+  Shoulder abduction 4+ 4 5 4, pain in L lat neck 5 4  Shoulder adduction        Shoulder internal rotation 4+ 4 4+ 4+ 4+ 4+  Shoulder external rotation 4 4 4+ 4 4+ 4   (Blank rows = not tested) * 05/03/23 - pain at R scapula with all resisted motions  CERVICAL SPECIAL TESTS:  Spurling's test: Positive and Distraction test: Positive   TODAY'S TREATMENT:   05/06/23 THERAPEUTIC EXERCISE: to improve flexibility, strength and mobility.  Verbal and tactile cues throughout for technique.  UBE: L1.0 x 4 min (backwards) Seated YTB scap retraction into pool noodle + B shoulder horiz ABD x 10 Seated YTB scap retraction into pool noodle + B shoulder horiz ABD diagonals (small angle) x 10 Seated YTB scap retraction into pool noodle + B shoulder ER x 10 Seated YTB scap retraction/depression with lat pull down x 10 Seated YTB scap retraction/depression with shoulder row x 10 Seated YTB scap retraction/depression with shoulder extension x 10 Seated YTB shoulder/scap protraction x 10 Wall push-up on orange Pball x 5 - discontinue d/t increased neck pain Ball on wall R/L shoulder protraction + small CW/CCW circles x 10 each direction L scapular clocks on wall with looped YTB at forearms x 5 Seated reactive cervical isometrics with PT adding gentle tension through YTB x 10 each for B lateral flexion, extension and flexion - slight neck discomfort with flexion isometric but all others well  tolerated   05/03/23 THERAPEUTIC EXERCISE: to improve flexibility, strength and mobility.  Verbal and tactile cues throughout for technique.  UBE: L1.0 x 4 min (backwards) Seated YTB scap retraction/depression with lat pull down 2 x 10 Standing leaning over orange Pball on mat table: Cervical retraction + thoracic extension + B bent arm row x 10 Cervical retraction + thoracic extension + B shoulder extension x 10 Cervical retraction + thoracic extension + B low angel horiz ABD x 5 - limited by c/o L posterior shoulder pain Seated gravity resisted cervical retraction 10 x 3" in fwd lean with elbows on thighs  THERAPEUTIC ACTIVITIES: Cervical & shoulder ROM Shoulder MMT Goal assessment  MANUAL THERAPY: To promote normalized muscle tension, improved flexibility, improved joint mobility, increased ROM, and reduced pain. STM/DTM, manual TPR to L lateral/posterior deltoid and lateral periscapular muscles    04/27/23 THERAPEUTIC EXERCISE: to improve flexibility, strength and mobility.  Verbal and tactile cues throughout for technique.  NuStep - L4 x 4 min (better tolerated that UBE today) Seated thoracic extension over back of chair with arms folded over chest x 10, with hands behind neck for pec stretch x 10 (Airex pad on seat of chair to raise her up and feet resting on 4" step) Seated L/R UT stretch - gentle lateral lean with hand under seat (elevated with Airex pad due pt having long arms) 2 x 30" Seated L/R LS stretch - gentle lateral lean with hand under seat (elevated with Airex pad due pt having long arms) 2 x 30" Seated scap retraction into pool noodle on back of chair 10 x 3-5" Seated YTB scap  retraction into pool noodle + B shoulder horiz ABD x 10 Seated YTB scap retraction into pool noodle + B shoulder horiz ABD diagonals (small angle) x 10 Seated YTB scap retraction into pool noodle + B shoulder ER x 10   PATIENT EDUCATION:  Education details: progress with PT and ongoing PT  POC  Person educated: Patient Education method: Explanation Education comprehension: verbalized understanding  HOME EXERCISE PROGRAM: Access Code: [email or web address] URL: [email or web address] Date: 04/27/2023 Prepared by: Glenetta Hew  Exercises - Seated Cervical Retraction  - 2-3 x daily - 7 x weekly - 2 sets - 10 reps - 3-5 sec hold - Shoulder Rolls in Sitting  - 2-3 x daily - 7 x weekly - 2 sets - 10 reps - 3 sec hold - Seated Scapular Retraction  - 2-3 x daily - 7 x weekly - 2 sets - 10 reps - 5 sec hold - Sternocleidomastoid Stretch  - 2-3 x daily - 7 x weekly - 3 reps - 30 sec hold - Standing Bilateral Low Shoulder Row with Anchored Resistance  - 1 x daily - 3-4 x weekly - 2 sets - 10 reps - 5 sec hold - Scapular Retraction with Resistance Advanced  - 1 x daily - 3-4 x weekly - 2 sets - 10 reps - 5 sec hold - Seated Shoulder Flexion Full Range (Mirrored)  - 1 x daily - 3-4 x weekly - 2 sets - 10 reps - 3 sec hold - Seated Single Arm Shoulder Abduction - Thumb Up  - 1 x daily - 3-4 x weekly - 2 sets - 10 reps - 3 sec hold - Shoulder Internal Rotation with Resistance (Mirrored)  - 1 x daily - 3 x weekly - 1-2 sets - 10 reps - 3 sec hold - Shoulder External Rotation with Anchored Resistance  - 1 x daily - 3 x weekly - 1-2 sets - 10 reps - 3 sec hold - Standing Isometric Cervical Sidebending with Manual Resistance  - 1 x daily - 7 x weekly - 1 sets - 5 reps - 10 sec hold hold - Standing Isometric Cervical Flexion with Manual Resistance  - 1 x daily - 7 x weekly - 1 sets - 5 reps - 10 sec hold - Standing Isometric Cervical Extension with Manual Resistance  - 1 x daily - 7 x weekly - 1 sets - 5 reps - 10 sec hold - Seated Isometric Cervical Rotation  - 1 x daily - 7 x weekly - 1 sets - 5 reps - 10 sec hold - Seated Shoulder Horizontal Abduction with Resistance  - 1 x daily - 3-4 x weekly - 2 sets - 10 reps - 3 sec hold   ASSESSMENT:  CLINICAL IMPRESSION: Stephanie [name] reports the MD  gave her 2 injections for her neck & L upper shoulder yesterday - notes some soreness from the injections but thinks they might be helping with her pain.  Continued gentle strengthening of postural muscles including cervical and periscapular musculature using pain as a guide, avoiding pushing into or through painful motions.  She was able to tolerate progression of cervical isometrics to reactive isometrics with PT providing gentle dynamic resistance via YTB.  Limited tolerance for progression of scapular stabilization exercises due to limited ability to weight-bear through left hand.  [place] reports good comfort with current HEP, however PT encouraging her to let PT know if adjustments or progression necessary with HEP as we are nearing the end of her current POC.  Will hopefully be able to transition to her HEP at the current episode, but will monitor tolerance and response to injections over the next few visits.  OBJECTIVE IMPAIRMENTS: decreased activity tolerance, decreased endurance, decreased knowledge of condition, decreased mobility, difficulty walking, decreased ROM, decreased strength, hypomobility, increased fascial restrictions, impaired perceived functional ability, increased muscle spasms, impaired flexibility, impaired UE functional use, improper body mechanics, postural dysfunction, and pain.   ACTIVITY LIMITATIONS: carrying, lifting, standing, sleeping, transfers, bed mobility, bathing, toileting, dressing, reach over head, and hygiene/grooming  PARTICIPATION LIMITATIONS: meal prep, cleaning, laundry, driving, shopping, community activity, occupation, yard work, and church  PERSONAL FACTORS: Age, Fitness, Past/current experiences, Time since onset of injury/illness/exacerbation, and 3+ comorbidities: Chronic neck pain, L shoulder advanced OA s/p reverse TSA on 10/14/22; R TKA 07/2019, B THR 2018 & 2017, posterior C1-2 cervical fusion 2018, rupture of flexor tendon of L hand with failed  surgical repair 2020, Parkinson's, psoriatic arthritis, bladder cancer, HTN    are also affecting patient's functional outcome.   REHAB POTENTIAL: Good  CLINICAL DECISION MAKING: Evolving/moderate complexity  EVALUATION COMPLEXITY: Moderate   GOALS: Goals reviewed with patient? Yes  SHORT TERM GOALS: Target date: 03/21/2023, extended to 05/03/2023   Patient will be independent with initial HEP to improve outcomes and carryover.  Baseline: Established 03/03/23 Goal status: MET  03/31/23  2.  Patient will report 25% improvement in neck pain to improve QOL.   Baseline: 8-10/10 Goal status: PARTIALLY MET  05/03/23 - minimal to no change in pain per pt report; pain ratings averaging from 3-4/10 up to 5-6/10 in recent visits as compared to 8-10/10 at eval  LONG TERM GOALS: Target date: 04/18/2023, extended to 05/24/2023   Patient will be independent with ongoing/advanced HEP for self-management at home.  Baseline:  Goal status: PARTIALLY MET  05/03/23 - Met for current HEP  2.  Patient will demonstrate improved posture to decrease muscle imbalance. Baseline: rounded shoulders, forward head, weight shift right, and head laterally shifted to R Goal status: IN PROGRESS  05/03/23 - still with significant forward head and flexed cervical and upper thoracic posture  3.  Patient will report 50-75% improvement in neck pain to improve QOL.  Baseline: 8-10/10 Goal status: IN PROGRESS  05/03/23 - minimal to no change in pain per pt report; pain ratings averaging 5-6/10 in recent visits as compared to 8-10/10 at eval  4.  Patient will demonstrate functional pain free cervical ROM for safety with driving.  Baseline: refer to above cervical ROM table Goal status: IN PROGRESS  05/03/23 - ROM improving in most planes but still painful for al directions except flexion and L rotation  5.  Patient will report </= 37% on NDI to demonstrate improved functional ability.  Baseline: 26 / 50 = 52.0 % Goal status: MET   04/12/23 - 17 / 50 = 34.0 %  6  Patient will report >/= 50% improvement in her sleep due to decreased neck pain. Baseline: Sleep greatly disturbed for up to 3-5 hrs due to neck pain per NDI Goal status: PARTIALLY MET  04/12/23 - Sleep mildly disturbed for up to 1-2 hrs per NDI  7.  Patient will be able to resume attending church services w/o limitation due to neck pain. Baseline: Has been unable to attend church Goal status: IN PROGRESS  04/25/23 - She has not been able to return to church d/t limited unsupported sitting tolerance as well as increased pain as she tries to turn her head to observe the  services  8. Patient will be able to resume walking for exercise w/o limitation due to neck pain. Baseline: Has been unable to walk d/t pain Goal status: PARTIALLY MET  05/03/23 - She has attempting walking for exercise but tolerance remains variable d/t neck pain - she was able take a longer walk yesterday at a slow pace   PLAN:  PT FREQUENCY: 2x/week  PT DURATION: 8 weeks  PLANNED INTERVENTIONS: Therapeutic exercises, Therapeutic activity, Neuromuscular re-education, Balance training, Patient/Family education, Self Care, Joint mobilization, Dry Needling, Electrical stimulation, Spinal mobilization, Cryotherapy, Moist heat, Taping, Ultrasound, Ionotophoresis 4mg /ml Dexamethasone, Manual therapy, and Re-evaluation  PLAN FOR NEXT SESSION: gently progress isometrics and/or gravity resisted cervical motion, cervical flexibility and ROM, postural correction and strengthening - thoracic extension and scapular retraction as tolerated; review and progress/consolidate HEP as indicated in preparation for transition to HEP at end of POC; MT +/- DN as indicated and benefit noted   Marry Guan, PT 05/06/2023, 12:31 PM

## 2023-05-09 ENCOUNTER — Other Ambulatory Visit: Payer: Self-pay | Admitting: Family Medicine

## 2023-05-10 ENCOUNTER — Encounter: Payer: Self-pay | Admitting: Physical Therapy

## 2023-05-10 ENCOUNTER — Ambulatory Visit: Payer: Self-pay | Admitting: Licensed Clinical Social Worker

## 2023-05-10 ENCOUNTER — Telehealth: Payer: Self-pay | Admitting: Pharmacist

## 2023-05-10 ENCOUNTER — Ambulatory Visit: Payer: Medicare Other | Admitting: Physical Therapy

## 2023-05-10 DIAGNOSIS — M542 Cervicalgia: Secondary | ICD-10-CM | POA: Diagnosis not present

## 2023-05-10 DIAGNOSIS — M6281 Muscle weakness (generalized): Secondary | ICD-10-CM | POA: Diagnosis not present

## 2023-05-10 DIAGNOSIS — R293 Abnormal posture: Secondary | ICD-10-CM

## 2023-05-10 DIAGNOSIS — M62838 Other muscle spasm: Secondary | ICD-10-CM

## 2023-05-10 NOTE — Therapy (Signed)
OUTPATIENT PHYSICAL THERAPY TREATMENT     Patient Name: Stephanie [name] MRN: [id number] DOB:February 02, 1932, 89 y.o., female Today's Date: 05/10/2023   END OF SESSION:  PT End of Session - 05/10/23 1315     Visit Number 18    Number of Visits 20    Date for PT Re-Evaluation 05/24/23    Authorization Type Medicare & BCBS    Progress Note Due on Visit 20    PT Start Time 1316    PT Stop Time 1400    PT Time Calculation (min) 44 min    Activity Tolerance Patient tolerated treatment well    Behavior During Therapy WFL for tasks assessed/performed                 Past Medical History:  Diagnosis Date   Allergy    Anemia    past hx    Arthropathy of cervical spine 11/27/2012   Right  Xray at [facility]  On 04/08/2016  AP, lateral, and lateral flexion and extension views of the cervical spine are submitted for evaluation.   No acute fracture identified in the cervical spine.   There is redemonstration of approximately 2 mm of C3 on C4 anterolisthesis in neutral which slightly increases in flexion relative to neutral and extension. Slight C5 on C6 retrolisthesis does not change i   Benign hypertension without congestive heart failure    Benign mole    right hcets mole, bleeds when dried with towel   Bilateral dry eyes    Bladder cancer (HCC) 2016/10/30   CAD (coronary artery disease), native coronary artery 04/2023   Coronary CTA showed a coronary calcium score to 46 with less than 25% left main, 25 to 49% proximal LAD, less than 25% left circumflex stenosis   Cataract    Cervical spondylosis    Cervicalgia    2 screws in place    Chronic kidney disease    Depression with anxiety 10-30-16   prior to husbands death    Dyspnea    Gross hematuria    developed after first hip replacment, led to indicental finding of bladder tumor, bleeding now resolved    H/O total shoulder replacement, left    History of kidney stones    1970's   Hyperlipidemia    Left hand weakness    due to  reinjury of flxor tendon s/p tendon surgery march 12-2018   Lumbar stenosis    Malignant tumor of urinary bladder (HCC) 07/2016   Pre-stage I   OA (osteoarthritis)    Parkinson disease dx 2012   neurologist-  dr siddiqui at [facility]   Psoriatic arthritis [facility])     Dr Nickola Major , [place] [facility]    Rupture of flexor tendon of hand 2020   right   Past Surgical History:  Procedure Laterality Date   BIOPSY MASS LEFT FIRST METACARPAL   06/23/2006   benign   CATARACT EXTRACTION W/ INTRAOCULAR LENS  IMPLANT, BILATERAL  1999 and 2003   CERVICAL FUSION  02/2017   c1-c2 ; reports she has 2 screws 2in long in place done at [facility] ; patient exhibits VERY LIMITED NECK ROM   COLONOSCOPY     COSMETIC SURGERY     CYSTOSCOPY W/ RETROGRADES Bilateral 09/22/2016   Procedure: CYSTOSCOPY WITH RETROGRADE PYELOGRAM;  Surgeon: Sebastian Ache, MD;  Location: [facility];  Service: Urology;  Laterality: Bilateral;   D & C HYSTEROSCOPY W/ RESECTION POLYP  07/11/2000   DILATION AND  CURETTAGE OF UTERUS  1960's   EXCISION CYST AND DEBRIDEMENT RIGHT WIRST AND REMOVAL FORGEIGN BODY  01/09/2009   EYE SURGERY     FLEXOR TENDON REPAIR Left 12/12/2018   Procedure: REPAIR/TRANSFER FLEXOR DIGITORUM PROFUNDUS OF LEFT SMALL FINGER;  Surgeon: Cindee Salt, MD;  Location: [facility];  Service: Orthopedics;  Laterality: Left;   JOINT REPLACEMENT     LAPAROSCOPY  yrs ago   infertility    METACARPOPHALANGEAL JOINT ARTHRODESIS Right 05/25/1996   REVERSE SHOULDER ARTHROPLASTY Left 10/14/2022   Procedure: REVERSE SHOULDER ARTHROPLASTY;  Surgeon: Francena Hanly, MD;  Location: [facility];  Service: Orthopedics;  Laterality: Left;    SPINE SURGERY     TENDON REPAIR Left 01/15/2019   Hand   TONSILLECTOMY AND ADENOIDECTOMY  child   TOTAL HIP ARTHROPLASTY Left 07/07/2016   Procedure: LEFT TOTAL HIP ARTHROPLASTY ANTERIOR APPROACH;  Surgeon: Ollen Gross, MD;  Location: [facility];  Service:  Orthopedics;  Laterality: Left;   TOTAL HIP ARTHROPLASTY Right 09/28/2017   Procedure: RIGHT TOTAL HIP ARTHROPLASTY ANTERIOR APPROACH;  Surgeon: Ollen Gross, MD;  Location: [facility];  Service: Orthopedics;  Laterality: Right;   TOTAL KNEE ARTHROPLASTY Right 07/30/2019   Procedure: TOTAL KNEE ARTHROPLASTY;  Surgeon: Ollen Gross, MD;  Location: [facility];  Service: Orthopedics;  Laterality: Right;    TRANSURETHRAL RESECTION OF BLADDER TUMOR N/A 09/22/2016   Procedure: TRANSURETHRAL RESECTION OF BLADDER TUMOR (TURBT);  Surgeon: Sebastian Ache, MD;  Location: [facility];  Service: Urology;  Laterality: N/A;   Patient Active Problem List   Diagnosis Date Noted   CAD (coronary artery disease), native coronary artery 04/20/2023   S/P reverse total shoulder arthroplasty, left 10/14/2022   Preoperative cardiovascular examination 10/04/2022   SOB (shortness of breath) 08/02/2022   Insomnia 08/02/2022   Hyponatremia 08/01/2022   Shoulder pain, left 09/14/2021   History of COVID-19 01/06/2021   Diarrhea 09/03/2020   Vitamin D deficiency 09/03/2020   OA (osteoarthritis) of knee 07/30/2019   Tinea corporis 06/08/2019   Psoriatic arthritis (HCC) 04/02/2019   Anemia 03/13/2018   Hot flashes 03/13/2018   S/P cervical spinal fusion 03/31/2017   Neck pain 12/12/2016   Depression with anxiety 10/17/2016   Bladder cancer (HCC) 10/17/2016   OA (osteoarthritis) of hip 07/07/2016   Spinal stenosis of lumbar region with radiculopathy 04/08/2016   Spondylolisthesis of lumbar region 04/08/2016   Spondylosis of cervical region without myelopathy or radiculopathy 04/08/2016   Preventative health care 01/18/2016   Low back pain 01/18/2016   History of chicken pox    Hyperlipidemia 12/15/2014   Allergic rhinitis 06/25/2014   Allergy to bee sting 06/10/2014   TMJ tenderness 11/16/2013   Other malaise and fatigue 05/07/2013   Arthropathy of cervical spine (HCC) 11/27/2012   Muscle  spasms of neck 11/27/2012   Parkinson's disease 05/24/2012   Conjunctivochalasis 03/09/2012   Epiretinal membrane 03/09/2012   Hyperopia with astigmatism and presbyopia 03/09/2012   Pseudophakia 03/09/2012   HTN (hypertension) 08/31/2011   Right knee pain 08/31/2011   Benign hypertensive heart disease without heart failure 05/12/2011    PCP: Bradd Canary, MD   REFERRING PROVIDER: Romero Belling, MD   REFERRING DIAG: Multilevel cervical spondylosis, L neck & trapezial myofascial pain/contracture   THERAPY DIAG:  Cervicalgia  Abnormal posture  Muscle weakness (generalized)  Other muscle spasm  RATIONALE FOR EVALUATION AND TREATMENT: Rehabilitation  ONSET DATE: acute on chronic  NEXT MD VISIT: PRN   SUBJECTIVE:  SUBJECTIVE STATEMENT: Pt reports the MD gave her 2 injections yesterday - sore from the injections, but thinks they are helping.  She states that MD told her that neurosurgical intervention for her neck will not be possible and that pain needs to be managed with injections and PT exercises.  PAIN: Are you having pain? Yes: NPRS scale: 4/10 Pain location: L neck  Pain description: more "sore" than pain Aggravating factors: any movement of head and neck, dyskinesia from PD meds Relieving factors: sitting still in her recliner, TENS  PERTINENT HISTORY:  Chronic neck pain, L shoulder advanced OA s/p reverse TSA on 10/14/22; R TKA 07/2019, B THR 2018 & 2017, posterior C1-2 cervical fusion 2018, rupture of flexor tendon of L hand with failed surgical repair 2020, Parkinson's, psoriatic arthritis, bladder cancer, HTN    PRECAUTIONS: None  HAND DOMINANCE: Right  WEIGHT BEARING RESTRICTIONS: No  FALLS:  Has patient fallen in last 6 months? No  LIVING  ENVIRONMENT: Lives with: lives alone Lives in: House/apartment Stairs: Yes: External: 4 steps; on right going up, on left going up, and can reach both Has following equipment at home: Single point cane, Walker - 2 wheeled, Shower bench, bed side commode, and Grab bars  OCCUPATION: Retired  [employer]: Independent and Leisure: volunteering at [employer], bible study, walking 1/3 mile daily - unable recently due to pain   PATIENT GOALS: "Get rid of this pain so I can go back to my normal activities."   OBJECTIVE: (objective measures completed at initial evaluation unless otherwise dated)  DIAGNOSTIC FINDINGS:  Cervical MRI - results not available, but pt reports she was told she had severe OA  02/07/23 - Cervical spine x-ray: IMPRESSION: Multilevel degenerative change and postoperative change without acute abnormality.  PATIENT SURVEYS:  NDI 26 / 50 = 52.0 % ; 17 / 50 = 34.0 %  COGNITION: Overall cognitive status: Within functional limits for tasks assessed  SENSATION: WFL  POSTURE:  rounded shoulders, forward head, weight shift right, and head laterally shifted to R  PALPATION: Severe increased muscle tension and TTP in L>R UT, LS and cervical paraspinals as well as L periscapular muscles   CERVICAL ROM:   Active ROM eval 04/12/23 05/03/23  Flexion 46 41 42  Extension 22 * 30 * 53 *  Right lateral flexion 16 * 12 * 20 *  Left lateral flexion 10 * 20 * 29 *  Right rotation 16 * 35 * 50 *  Left rotation 18* 25 * 36   (Blank rows = not tested, * = pain) 02/21/23 - clicking with B rotation  UPPER EXTREMITY ROM:  Active ROM Right  eval Left    eval Right 04/12/23 Left 04/12/23 Right 05/03/23 Left 05/03/23  Shoulder flexion 146 111 156 135 162 129  Shoulder extension 50 42 55 38 56 42  Shoulder abduction 155 118 178 125 181 120  Shoulder adduction        Shoulder internal rotation FIR to sacrum FIR to lateral buttock FIR L5 FIR to lateral buttock FIR WNL FIR to lateral buttock   Shoulder external rotation FER T3 FER T1 FER T3 FER T1 FER T3 FER T2  Elbow flexion        Elbow extension        Wrist flexion        Wrist extension        Wrist ulnar deviation        Wrist radial deviation  Wrist pronation        Wrist supination         (Blank rows = not tested)  UPPER EXTREMITY MMT:   MMT Right 03/03/23 Left 03/03/23 Right 04/12/23 Left 04/12/23 Right 05/03/23 Left* 05/03/23  Shoulder flexion 4+ 4- 5 4, pain in scapula 5 4+  Shoulder extension 4+ 4+ 5 4+, clicking in neck 5 4+  Shoulder abduction 4+ 4 5 4, pain in L lat neck 5 4  Shoulder adduction        Shoulder internal rotation 4+ 4 4+ 4+ 4+ 4+  Shoulder external rotation 4 4 4+ 4 4+ 4   (Blank rows = not tested) * 05/03/23 - pain at R scapula with all resisted motions  CERVICAL SPECIAL TESTS:  Spurling's test: Positive and Distraction test: Positive   TODAY'S TREATMENT:   05/10/23 THERAPEUTIC EXERCISE: to improve flexibility, strength and mobility.  Verbal and tactile cues throughout for technique.  UBE: L1.0 x 4 min (backwards) Initiated HEP review: Seated cervical retraction 10 x 3" Reverse shoulder rolls in sitting x 10   Seated scapular retraction 10 x 3" Seated L/R SCM stretch 2 x 30" - clarification provided for hand placement on clavicle to stabilize distal muscle attachment during stretch along with targeted area where she should expect to feel stretch Seated L UT stretch x 30" with hand under edge of seat (patient having difficulty with grip due to hand weakness/tendon injury), 2 x 30" with towel over shoulder to create shoulder and scapular depression, 2 x 30" with active L shoulder and scapula retraction/depression + slight overpressure with right hand until stretch felt without increased pain (patient noting lateral version of the stretch most effective w/o triggering increased pain) Seated L LS stretch 3 x 30" with active L shoulder and scapula retraction/depression + slight overpressure  with right hand until stretch felt without increased pain  Verbal review of cervical isometrics with patient able to demonstrate proper hand placement for resistance x 1 rep each Verbal review of L shoulder ROM exercises clarifying that these are more related to her recent reverse TSA rather than her neck, with patient wanting to continue with these to help improve her shoulder function   05/06/23 THERAPEUTIC EXERCISE: to improve flexibility, strength and mobility.  Verbal and tactile cues throughout for technique.  UBE: L1.0 x 4 min (backwards) Seated YTB scap retraction into pool noodle + B shoulder horiz ABD x 10 Seated YTB scap retraction into pool noodle + B shoulder horiz ABD diagonals (small angle) x 10 Seated YTB scap retraction into pool noodle + B shoulder ER x 10 Seated YTB scap retraction/depression with lat pull down x 10 Seated YTB scap retraction/depression with shoulder row x 10 Seated YTB scap retraction/depression with shoulder extension x 10 Seated YTB shoulder/scap protraction x 10 Wall push-up on orange Pball x 5 - discontinue d/t increased neck pain Ball on wall R/L shoulder protraction + small CW/CCW circles x 10 each direction L scapular clocks on wall with looped YTB at forearms x 5 Seated reactive cervical isometrics with PT adding gentle tension through YTB x 10 each for B lateral flexion, extension and flexion - slight neck discomfort with flexion isometric but all others well tolerated   05/03/23 THERAPEUTIC EXERCISE: to improve flexibility, strength and mobility.  Verbal and tactile cues throughout for technique.  UBE: L1.0 x 4 min (backwards) Seated YTB scap retraction/depression with lat pull down 2 x 10 Standing leaning over orange Pball on mat table:  Cervical retraction + thoracic extension + B bent arm row x 10 Cervical retraction + thoracic extension + B shoulder extension x 10 Cervical retraction + thoracic extension + B low angel horiz ABD x 5 - limited  by c/o L posterior shoulder pain Seated gravity resisted cervical retraction 10 x 3" in fwd lean with elbows on thighs  THERAPEUTIC ACTIVITIES: Cervical & shoulder ROM Shoulder MMT Goal assessment  MANUAL THERAPY: To promote normalized muscle tension, improved flexibility, improved joint mobility, increased ROM, and reduced pain. STM/DTM, manual TPR to L lateral/posterior deltoid and lateral periscapular muscles    PATIENT EDUCATION:  Education details: progress with PT and ongoing PT POC  Person educated: Patient Education method: Explanation Education comprehension: verbalized understanding  HOME EXERCISE PROGRAM: Access Code: [email or web address] URL: [email or web address] Date: 05/10/2023 Prepared by: Glenetta Hew  Exercises - Seated Cervical Retraction  - 2-3 x daily - 7 x weekly - 2 sets - 10 reps - 3-5 sec hold - Shoulder Rolls in Sitting  - 2-3 x daily - 7 x weekly - 2 sets - 10 reps - 3 sec hold - Seated Scapular Retraction  - 2-3 x daily - 7 x weekly - 2 sets - 10 reps - 5 sec hold - Sternocleidomastoid Stretch  - 2-3 x daily - 7 x weekly - 3 reps - 30 sec hold - Seated Cervical Sidebending Stretch  - 1-3 x daily - 7 x weekly - 3 reps - 30 sec hold - Seated Levator Scapulae Stretch  - 1-3 x daily - 7 x weekly - 3 reps - 30 sec hold - Seated Thoracic Lumbar Extension  - 1 x daily - 7 x weekly - 2 sets - 10 reps - 3 sec hold - Standing Isometric Cervical Sidebending with Manual Resistance  - 1 x daily - 7 x weekly - 1 sets - 5 reps - 10 sec hold hold - Standing Isometric Cervical Flexion with Manual Resistance  - 1 x daily - 7 x weekly - 1 sets - 5 reps - 10 sec hold - Standing Isometric Cervical Extension with Manual Resistance  - 1 x daily - 7 x weekly - 1 sets - 5 reps - 10 sec hold - Seated Isometric Cervical Rotation  - 1 x daily - 7 x weekly - 1 sets - 5 reps - 10 sec hold - Standing Bilateral Low Shoulder Row with Anchored Resistance  - 1 x daily - 3-4 x weekly -  2 sets - 10 reps - 5 sec hold - Scapular Retraction with Resistance Advanced  - 1 x daily - 3-4 x weekly - 2 sets - 10 reps - 5 sec hold - Seated Shoulder Horizontal Abduction with Resistance  - 1 x daily - 3 x weekly - 2 sets - 10 reps - 3 sec hold - Seated Bilateral Shoulder External Rotation with Resistance at Wrists  - 1 x daily - 3 x weekly - 2 sets - 10 reps - 3 sec hold - Seated Shoulder Flexion Full Range (Mirrored)  - 1 x daily - 3-4 x weekly - 2 sets - 10 reps - 3 sec hold - Seated Single Arm Shoulder Abduction - Thumb Up  - 1 x daily - 3-4 x weekly - 2 sets - 10 reps - 3 sec hold - Shoulder Internal Rotation with Resistance (Mirrored)  - 1 x daily - 3 x weekly - 1-2 sets - 10 reps - 3 sec hold - Shoulder External Rotation with Anchored Resistance  -  1 x daily - 3 x weekly - 1-2 sets - 10 reps - 3 sec hold   ASSESSMENT:  CLINICAL IMPRESSION: Initiated HEP review providing clarification proper positioning and technique during stretching exercises avoiding forcing painful ROM, confirming proper technique for cervical isometric strengthening exercises and distinguishing between shoulder focused exercises versus postural (neck and scapular) exercises.  Shawn expressing desire to continue to include shoulder exercises and her HEP to further improve tension following her recent L reverse TSA.  Will plan to complete HEP review of area resisted postural strengthening next visit as well as initiate final goal assessment in anticipation of transition to HEP at end of current POC.  OBJECTIVE IMPAIRMENTS: decreased activity tolerance, decreased endurance, decreased knowledge of condition, decreased mobility, difficulty walking, decreased ROM, decreased strength, hypomobility, increased fascial restrictions, impaired perceived functional ability, increased muscle spasms, impaired flexibility, impaired UE functional use, improper body mechanics, postural dysfunction, and pain.   ACTIVITY LIMITATIONS:  carrying, lifting, standing, sleeping, transfers, bed mobility, bathing, toileting, dressing, reach over head, and hygiene/grooming  PARTICIPATION LIMITATIONS: meal prep, cleaning, laundry, driving, shopping, community activity, occupation, yard work, and church  PERSONAL FACTORS: Age, Fitness, Past/current experiences, Time since onset of injury/illness/exacerbation, and 3+ comorbidities: Chronic neck pain, L shoulder advanced OA s/p reverse TSA on 10/14/22; R TKA 07/2019, B THR 2018 & 2017, posterior C1-2 cervical fusion 2018, rupture of flexor tendon of L hand with failed surgical repair 2020, Parkinson's, psoriatic arthritis, bladder cancer, HTN    are also affecting patient's functional outcome.   REHAB POTENTIAL: Good  CLINICAL DECISION MAKING: Evolving/moderate complexity  EVALUATION COMPLEXITY: Moderate   GOALS: Goals reviewed with patient? Yes  SHORT TERM GOALS: Target date: 03/21/2023, extended to 05/03/2023   Patient will be independent with initial HEP to improve outcomes and carryover.  Baseline: Established 03/03/23 Goal status: MET  03/31/23  2.  Patient will report 25% improvement in neck pain to improve QOL.   Baseline: 8-10/10 Goal status: PARTIALLY MET  05/03/23 - minimal to no change in pain per pt report; pain ratings averaging from 3-4/10 up to 5-6/10 in recent visits as compared to 8-10/10 at eval  LONG TERM GOALS: Target date: 04/18/2023, extended to 05/24/2023   Patient will be independent with ongoing/advanced HEP for self-management at home.  Baseline:  Goal status: PARTIALLY MET  05/03/23 - Met for current HEP  2.  Patient will demonstrate improved posture to decrease muscle imbalance. Baseline: rounded shoulders, forward head, weight shift right, and head laterally shifted to R Goal status: IN PROGRESS  05/03/23 - still with significant forward head and flexed cervical and upper thoracic posture  3.  Patient will report 50-75% improvement in neck pain to improve  QOL.  Baseline: 8-10/10 Goal status: IN PROGRESS  05/03/23 - minimal to no change in pain per pt report; pain ratings averaging 5-6/10 in recent visits as compared to 8-10/10 at eval  4.  Patient will demonstrate functional pain free cervical ROM for safety with driving.  Baseline: refer to above cervical ROM table Goal status: IN PROGRESS  05/03/23 - ROM improving in most planes but still painful for al directions except flexion and L rotation  5.  Patient will report </= 37% on NDI to demonstrate improved functional ability.  Baseline: 26 / 50 = 52.0 % Goal status: MET  04/12/23 - 17 / 50 = 34.0 %  6  Patient will report >/= 50% improvement in her sleep due to decreased neck pain. Baseline: Sleep greatly disturbed for  up to 3-5 hrs due to neck pain per NDI Goal status: PARTIALLY MET  04/12/23 - Sleep mildly disturbed for up to 1-2 hrs per NDI  7.  Patient will be able to resume attending church services w/o limitation due to neck pain. Baseline: Has been unable to attend church Goal status: IN PROGRESS  04/25/23 - She has not been able to return to church d/t limited unsupported sitting tolerance as well as increased pain as she tries to turn her head to observe the services  8. Patient will be able to resume walking for exercise w/o limitation due to neck pain. Baseline: Has been unable to walk d/t pain Goal status: PARTIALLY MET  05/03/23 - She has attempting walking for exercise but tolerance remains variable d/t neck pain - she was able take a longer walk yesterday at a slow pace   PLAN:  PT FREQUENCY: 2x/week  PT DURATION: 8 weeks  PLANNED INTERVENTIONS: Therapeutic exercises, Therapeutic activity, Neuromuscular re-education, Balance training, Patient/Family education, Self Care, Joint mobilization, Dry Needling, Electrical stimulation, Spinal mobilization, Cryotherapy, Moist heat, Taping, Ultrasound, Ionotophoresis 4mg /ml Dexamethasone, Manual therapy, and Re-evaluation  PLAN FOR  NEXT SESSION: print UT & LS stretches, horiz ABD & B shoulder ER strengthening exercises (printer offline on 05/10/23 visit); complete HEP review with modifications/updates/consolidation as indicated in preparation for transition to HEP at end of POC; MT +/- DN as indicated; initiate final goal assessment   Marry Guan, PT 05/10/2023, 7:35 PM

## 2023-05-10 NOTE — Patient Outreach (Signed)
  Care Coordination   Follow Up Visit Note   05/10/2023 Name: Stephanie [name] MRN: [id number] DOB: 24-Dec-1931  Stephanie [name] is a 89 y.o. year old female who sees Stephanie [name], Stephanie Grate, Stephanie [name] for primary care. I spoke with  Stephanie [name] by phone today.  What matters to the patients health and wellness today? Patient has ongoing neck pain issues.     Goals Addressed             This Visit's Progress    Patient has ongoing neck pain issues and is talking with medical provider about managing pain issues       Interventions:  Spoke with client about client needs Discussed program support for client Discussed pain issues of client. She has neck pain issues and is seeing medical provider regarding neck pain issues She said she is having decreased sleep due to neck pain issues She drives herself to medical appointments and to complete errands Discussed medication procurement Provided counseling support Discussed vision of client. She sees ophthalmologist annually for eye exam Discussed ambulation of client. She spoke of neck pain issues when she walks. She said that when she is sitting she does not have neck pain.   Discussed family support She communicates regularly with family. She has two grandsons who reside in [place], [place] Regarding treatment for neck pain, Stephanie [name] has had dry needling treatment for her neck pain Stephanie [name] said she has reduced energy level Client said she is taking medications as prescribed Discussed mood of client. She feels that main issue is pain management at this time.  Client has appointment with PCP in July of 2024.   Discussed physical therapy sessions. Client has 3 more physical therapy sessions remaining. She feels that these sessions are helpful. She does exercises at home Encouraged client to call LCSW as needed for SW support at [phone].           SDOH assessments and interventions completed:  Yes  SDOH Interventions Today    Flowsheet Row  Most Recent Value  SDOH Interventions   Depression Interventions/Treatment  Counseling  Physical Activity Interventions Other (Comments)  [can walk but as ongoing neck pain issues. Has talked with medical provider about care options for neck pain]  Stress Interventions Provide Counseling  [ongoing neck pain issues]        Care Coordination Interventions:  Yes, provided   Interventions Today    Flowsheet Row Most Recent Value  Chronic Disease   Chronic disease during today's visit Other  [spoke with client about client needs]  General Interventions   General Interventions Discussed/Reviewed General Interventions Discussed, Community Resources  [reviewed program support]  Exercise Interventions   Exercise Discussed/Reviewed Physical Activity  [has neck pain issues]  Physical Activity Discussed/Reviewed Physical Activity Discussed  Education Interventions   Education Provided Provided Education  Provided Verbal Education On Community Resources  Mental Health Interventions   Mental Health Discussed/Reviewed Anxiety, Coping Strategies  [discussed anxiety management and stress management. attending scheduled medical appointments. likes to watch sports events to help her relax]  Nutrition Interventions   Nutrition Discussed/Reviewed Nutrition Discussed  Pharmacy Interventions   Pharmacy Dicussed/Reviewed Pharmacy Topics Discussed  Safety Interventions   Safety Discussed/Reviewed Fall Risk        Follow up plan: Follow up call scheduled for 06/13/23 at 10:30 AM    Encounter Outcome:  Pt. Visit Completed   Stephanie [name].Stephanie [name] MSW, LCSW Licensed Visual merchandiser [facility] Care Management [phone]

## 2023-05-10 NOTE — Telephone Encounter (Signed)
Call patient to assess tolerability on Crestor 5 mg twice week dose. Patient reports her neck pain and back pain is same as before. Crestor does not seems to worsen the pain so will continue taking 5 mg twice week and from Monday July 1 she will start taking Zetia 10 mg daily.   Will follow up in 1-2 weeks to assess tolerance to the change.

## 2023-05-10 NOTE — Patient Instructions (Signed)
Visit Information  Thank you for taking time to visit with me today. Please don't hesitate to contact me if I can be of assistance to you.   Following are the goals we discussed today:   Goals Addressed             This Visit's Progress    Patient has ongoing neck pain issues and is talking with medical provider about managing pain issues       Interventions:  Spoke with client about client needs Discussed program support for client Discussed pain issues of client. She has neck pain issues and is seeing medical provider regarding neck pain issues She said she is having decreased sleep due to neck pain issues She drives herself to medical appointments and to complete errands Discussed medication procurement Provided counseling support Discussed vision of client. She sees ophthalmologist annually for eye exam Discussed ambulation of client. She spoke of neck pain issues when she walks. She said that when she is sitting she does not have neck pain.   Discussed family support She communicates regularly with family. She has two grandsons who reside in [place], [place] Regarding treatment for neck pain, Juliza has had dry needling treatment for her neck pain Jaidah said she has reduced energy level Client said she is taking medications as prescribed Discussed mood of client. She feels that main issue is pain management at this time.  Client has appointment with PCP in July of 2024.   Discussed physical therapy sessions. Client has 3 more physical therapy sessions remaining. She feels that these sessions are helpful. She does exercises at home Encouraged client to call LCSW as needed for SW support at [phone].           Our next appointment is by telephone on 06/13/23 at 10:30 AM   Please call the care guide team at [phone] if you need to cancel or reschedule your appointment.   If you are experiencing a Mental Health or Behavioral Health Crisis or need someone to talk to,  please go to [facility] [address] [place] [phone])   The patient verbalized understanding of instructions, educational materials, and care plan provided today and DECLINED offer to receive copy of patient instructions, educational materials, and care plan.   The patient has been provided with contact information for the care management team and has been advised to call with any health related questions or concerns.   Kelton Pillar.Meeghan Skipper MSW, LCSW Licensed Visual merchandiser [facility] Care Management [phone]

## 2023-05-11 ENCOUNTER — Other Ambulatory Visit: Payer: Self-pay | Admitting: Family Medicine

## 2023-05-11 ENCOUNTER — Telehealth: Payer: Self-pay | Admitting: Family Medicine

## 2023-05-11 MED ORDER — DIAZEPAM 5 MG PO TABS: ORAL_TABLET | ORAL | 1 refills | Status: DC

## 2023-05-11 NOTE — Telephone Encounter (Signed)
Prescription Request  05/11/2023  Is this a "Controlled Substance" medicine? Yes  LOV: 02/28/2023  What is the name of the medication or equipment?   diazepam (VALIUM) 5 MG tablet [[id number]]   Have you contacted your pharmacy to request a refill? No   Which pharmacy would you like this sent to?  [id number] [address] [address], [place] - [address] [place] [address] Phone: [phone] Fax: [phone]    Patient notified that their request is being sent to the clinical staff for review and that they should receive a response within 2 business days.   Please advise at Mobile [phone] (mobile)

## 2023-05-12 ENCOUNTER — Ambulatory Visit: Payer: Medicare Other | Admitting: Physical Therapy

## 2023-05-16 ENCOUNTER — Encounter: Payer: Self-pay | Admitting: Physical Therapy

## 2023-05-16 ENCOUNTER — Ambulatory Visit: Payer: Medicare Other | Attending: Neurology | Admitting: Physical Therapy

## 2023-05-16 DIAGNOSIS — M542 Cervicalgia: Secondary | ICD-10-CM | POA: Insufficient documentation

## 2023-05-16 DIAGNOSIS — M62838 Other muscle spasm: Secondary | ICD-10-CM | POA: Diagnosis not present

## 2023-05-16 DIAGNOSIS — R293 Abnormal posture: Secondary | ICD-10-CM | POA: Diagnosis not present

## 2023-05-16 DIAGNOSIS — M6281 Muscle weakness (generalized): Secondary | ICD-10-CM | POA: Insufficient documentation

## 2023-05-16 NOTE — Therapy (Signed)
OUTPATIENT PHYSICAL THERAPY TREATMENT     Patient Name: Stephanie [name] MRN: [id number] DOB:1932-07-22, 89 y.o., female Today's Date: 05/16/2023   END OF SESSION:  PT End of Session - 05/16/23 1315     Visit Number 19    Number of Visits 20    Date for PT Re-Evaluation 05/24/23    Authorization Type Medicare & BCBS    Progress Note Due on Visit 20    PT Start Time 1315    PT Stop Time 1401    PT Time Calculation (min) 46 min    Activity Tolerance Patient tolerated treatment well    Behavior During Therapy WFL for tasks assessed/performed                 Past Medical History:  Diagnosis Date   Allergy    Anemia    past hx    Arthropathy of cervical spine 11/27/2012   Right  Xray at [facility]  On 04/08/2016  AP, lateral, and lateral flexion and extension views of the cervical spine are submitted for evaluation.   No acute fracture identified in the cervical spine.   There is redemonstration of approximately 2 mm of C3 on C4 anterolisthesis in neutral which slightly increases in flexion relative to neutral and extension. Slight C5 on C6 retrolisthesis does not change i   Benign hypertension without congestive heart failure    Benign mole    right hcets mole, bleeds when dried with towel   Bilateral dry eyes    Bladder cancer (HCC) 10/19/2016   CAD (coronary artery disease), native coronary artery 04/2023   Coronary CTA showed a coronary calcium score to 46 with less than 25% left main, 25 to 49% proximal LAD, less than 25% left circumflex stenosis   Cataract    Cervical spondylosis    Cervicalgia    2 screws in place    Chronic kidney disease    Depression with anxiety 2016-10-19   prior to husbands death    Dyspnea    Gross hematuria    developed after first hip replacment, led to indicental finding of bladder tumor, bleeding now resolved    H/O total shoulder replacement, left    History of kidney stones    1970's   Hyperlipidemia    Left hand weakness    due to  reinjury of flxor tendon s/p tendon surgery march 12-2018   Lumbar stenosis    Malignant tumor of urinary bladder (HCC) 07/2016   Pre-stage I   OA (osteoarthritis)    Parkinson disease dx 2012   neurologist-  dr siddiqui at [facility]   Psoriatic arthritis [facility])     Dr Nickola Major , [place] [facility]    Rupture of flexor tendon of hand 2020   right   Past Surgical History:  Procedure Laterality Date   BIOPSY MASS LEFT FIRST METACARPAL   06/23/2006   benign   CATARACT EXTRACTION W/ INTRAOCULAR LENS  IMPLANT, BILATERAL  1999 and 2003   CERVICAL FUSION  02/2017   c1-c2 ; reports she has 2 screws 2in long in place done at [facility] ; patient exhibits VERY LIMITED NECK ROM   COLONOSCOPY     COSMETIC SURGERY     CYSTOSCOPY W/ RETROGRADES Bilateral 09/22/2016   Procedure: CYSTOSCOPY WITH RETROGRADE PYELOGRAM;  Surgeon: Sebastian Ache, MD;  Location: [facility];  Service: Urology;  Laterality: Bilateral;   D & C HYSTEROSCOPY W/ RESECTION POLYP  07/11/2000   DILATION AND  CURETTAGE OF UTERUS  1960's   EXCISION CYST AND DEBRIDEMENT RIGHT WIRST AND REMOVAL FORGEIGN BODY  01/09/2009   EYE SURGERY     FLEXOR TENDON REPAIR Left 12/12/2018   Procedure: REPAIR/TRANSFER FLEXOR DIGITORUM PROFUNDUS OF LEFT SMALL FINGER;  Surgeon: Cindee Salt, MD;  Location: [facility];  Service: Orthopedics;  Laterality: Left;   JOINT REPLACEMENT     LAPAROSCOPY  yrs ago   infertility    METACARPOPHALANGEAL JOINT ARTHRODESIS Right 05/25/1996   REVERSE SHOULDER ARTHROPLASTY Left 10/14/2022   Procedure: REVERSE SHOULDER ARTHROPLASTY;  Surgeon: Francena Hanly, MD;  Location: [facility];  Service: Orthopedics;  Laterality: Left;    SPINE SURGERY     TENDON REPAIR Left 01/15/2019   Hand   TONSILLECTOMY AND ADENOIDECTOMY  child   TOTAL HIP ARTHROPLASTY Left 07/07/2016   Procedure: LEFT TOTAL HIP ARTHROPLASTY ANTERIOR APPROACH;  Surgeon: Ollen Gross, MD;  Location: [facility];  Service:  Orthopedics;  Laterality: Left;   TOTAL HIP ARTHROPLASTY Right 09/28/2017   Procedure: RIGHT TOTAL HIP ARTHROPLASTY ANTERIOR APPROACH;  Surgeon: Ollen Gross, MD;  Location: [facility];  Service: Orthopedics;  Laterality: Right;   TOTAL KNEE ARTHROPLASTY Right 07/30/2019   Procedure: TOTAL KNEE ARTHROPLASTY;  Surgeon: Ollen Gross, MD;  Location: [facility];  Service: Orthopedics;  Laterality: Right;    TRANSURETHRAL RESECTION OF BLADDER TUMOR N/A 09/22/2016   Procedure: TRANSURETHRAL RESECTION OF BLADDER TUMOR (TURBT);  Surgeon: Sebastian Ache, MD;  Location: [facility];  Service: Urology;  Laterality: N/A;   Patient Active Problem List   Diagnosis Date Noted   CAD (coronary artery disease), native coronary artery 04/20/2023   S/P reverse total shoulder arthroplasty, left 10/14/2022   Preoperative cardiovascular examination 10/04/2022   SOB (shortness of breath) 08/02/2022   Insomnia 08/02/2022   Hyponatremia 08/01/2022   Shoulder pain, left 09/14/2021   History of COVID-19 01/06/2021   Diarrhea 09/03/2020   Vitamin D deficiency 09/03/2020   OA (osteoarthritis) of knee 07/30/2019   Tinea corporis 06/08/2019   Psoriatic arthritis (HCC) 04/02/2019   Anemia 03/13/2018   Hot flashes 03/13/2018   S/P cervical spinal fusion 03/31/2017   Neck pain 12/12/2016   Depression with anxiety 10/17/2016   Bladder cancer (HCC) 10/17/2016   OA (osteoarthritis) of hip 07/07/2016   Spinal stenosis of lumbar region with radiculopathy 04/08/2016   Spondylolisthesis of lumbar region 04/08/2016   Spondylosis of cervical region without myelopathy or radiculopathy 04/08/2016   Preventative health care 01/18/2016   Low back pain 01/18/2016   History of chicken pox    Hyperlipidemia 12/15/2014   Allergic rhinitis 06/25/2014   Allergy to bee sting 06/10/2014   TMJ tenderness 11/16/2013   Other malaise and fatigue 05/07/2013   Arthropathy of cervical spine (HCC) 11/27/2012   Muscle  spasms of neck 11/27/2012   Parkinson's disease 05/24/2012   Conjunctivochalasis 03/09/2012   Epiretinal membrane 03/09/2012   Hyperopia with astigmatism and presbyopia 03/09/2012   Pseudophakia 03/09/2012   HTN (hypertension) 08/31/2011   Right knee pain 08/31/2011   Benign hypertensive heart disease without heart failure 05/12/2011    PCP: Bradd Canary, MD   REFERRING PROVIDER: Romero Belling, MD   REFERRING DIAG: Multilevel cervical spondylosis, L neck & trapezial myofascial pain/contracture   THERAPY DIAG:  Cervicalgia  Abnormal posture  Muscle weakness (generalized)  Other muscle spasm  RATIONALE FOR EVALUATION AND TREATMENT: Rehabilitation  ONSET DATE: acute on chronic  NEXT MD VISIT: PRN   SUBJECTIVE:  SUBJECTIVE STATEMENT: Pt reports her neck pain typically gets up to 5/10 when present.  PAIN: Are you having pain? Yes: NPRS scale:  5/10 Pain location: L neck  Pain description: more "sore" than pain Aggravating factors: any movement of head and neck, dyskinesia from PD meds Relieving factors: sitting still in her recliner, TENS  PERTINENT HISTORY:  Chronic neck pain, L shoulder advanced OA s/p reverse TSA on 10/14/22; R TKA 07/2019, B THR 2018 & 2017, posterior C1-2 cervical fusion 2018, rupture of flexor tendon of L hand with failed surgical repair 2020, Parkinson's, psoriatic arthritis, bladder cancer, HTN    PRECAUTIONS: None  HAND DOMINANCE: Right  WEIGHT BEARING RESTRICTIONS: No  FALLS:  Has patient fallen in last 6 months? No  LIVING ENVIRONMENT: Lives with: lives alone Lives in: House/apartment Stairs: Yes: External: 4 steps; on right going up, on left going up, and can reach both Has following equipment at home: Single point cane, Walker - 2  wheeled, Shower bench, bed side commode, and Grab bars  OCCUPATION: Retired  [employer]: Independent and Leisure: volunteering at [employer], bible study, walking 1/3 mile daily - unable recently due to pain   PATIENT GOALS: "Get rid of this pain so I can go back to my normal activities."   OBJECTIVE: (objective measures completed at initial evaluation unless otherwise dated)  DIAGNOSTIC FINDINGS:  Cervical MRI - results not available, but pt reports she was told she had severe OA  02/07/23 - Cervical spine x-ray: IMPRESSION: Multilevel degenerative change and postoperative change without acute abnormality.  PATIENT SURVEYS:  NDI 26 / 50 = 52.0 % ; 17 / 50 = 34.0 %  COGNITION: Overall cognitive status: Within functional limits for tasks assessed  SENSATION: WFL  POSTURE:  rounded shoulders, forward head, weight shift right, and head laterally shifted to R  PALPATION: Severe increased muscle tension and TTP in L>R UT, LS and cervical paraspinals as well as L periscapular muscles   CERVICAL ROM:   Active ROM eval 04/12/23 05/03/23  Flexion 46 41 42  Extension 22 * 30 * 53 *  Right lateral flexion 16 * 12 * 20 *  Left lateral flexion 10 * 20 * 29 *  Right rotation 16 * 35 * 50 *  Left rotation 18* 25 * 36   (Blank rows = not tested, * = pain) 02/21/23 - clicking with B rotation  UPPER EXTREMITY ROM:  Active ROM Right  eval Left    eval Right 04/12/23 Left 04/12/23 Right 05/03/23 Left 05/03/23  Shoulder flexion 146 111 156 135 162 129  Shoulder extension 50 42 55 38 56 42  Shoulder abduction 155 118 178 125 181 120  Shoulder adduction        Shoulder internal rotation FIR to sacrum FIR to lateral buttock FIR L5 FIR to lateral buttock FIR WNL FIR to lateral buttock  Shoulder external rotation FER T3 FER T1 FER T3 FER T1 FER T3 FER T2  Elbow flexion        Elbow extension        Wrist flexion        Wrist extension        Wrist ulnar deviation        Wrist radial deviation         Wrist pronation        Wrist supination         (Blank rows = not tested)  UPPER EXTREMITY MMT:   MMT Right 03/03/23 Left  03/03/23 Right 04/12/23 Left 04/12/23 Right 05/03/23 Left* 05/03/23  Shoulder flexion 4+ 4- 5 4, pain in scapula 5 4+  Shoulder extension 4+ 4+ 5 4+, clicking in neck 5 4+  Shoulder abduction 4+ 4 5 4, pain in L lat neck 5 4  Shoulder adduction        Shoulder internal rotation 4+ 4 4+ 4+ 4+ 4+  Shoulder external rotation 4 4 4+ 4 4+ 4   (Blank rows = not tested) * 05/03/23 - pain at R scapula with all resisted motions  CERVICAL SPECIAL TESTS:  Spurling's test: Positive and Distraction test: Positive   TODAY'S TREATMENT:   05/16/23 THERAPEUTIC EXERCISE: to improve flexibility, strength and mobility.  Verbal and tactile cues throughout for technique.  UBE: L1.0 x 4 min (backwards) Seated L UT stretch 2 x 30" with active L shoulder and scapula retraction/depression + slight overpressure with right hand until stretch felt without increased pain  Seated L LS stretch 3 x 30" with active L shoulder and scapula retraction/depression + slight overpressure with right hand until stretch felt without increased pain  Thoracic extension mobs over back of chair with arms crossed on chest 10 x 5" Completed HEP review with pt verbalizing good understanding.  MANUAL THERAPY: To promote normalized muscle tension, improved flexibility, improved joint mobility, increased ROM, and reduced pain. STM/DTM and manual TPR to L infraspinatus, rhomboids, LS, UT and teres group Gentle L scapular mobs into retraction and depression   05/10/23 THERAPEUTIC EXERCISE: to improve flexibility, strength and mobility.  Verbal and tactile cues throughout for technique.  UBE: L1.0 x 4 min (backwards) Initiated HEP review: Seated cervical retraction 10 x 3" Reverse shoulder rolls in sitting x 10   Seated scapular retraction 10 x 3" Seated L/R SCM stretch 2 x 30" - clarification provided for hand  placement on clavicle to stabilize distal muscle attachment during stretch along with targeted area where she should expect to feel stretch Seated L UT stretch x 30" with hand under edge of seat (patient having difficulty with grip due to hand weakness/tendon injury), 2 x 30" with towel over shoulder to create shoulder and scapular depression, 2 x 30" with active L shoulder and scapula retraction/depression + slight overpressure with right hand until stretch felt without increased pain (patient noting latter version of the stretch most effective w/o triggering increased pain) Seated L LS stretch 3 x 30" with active L shoulder and scapula retraction/depression + slight overpressure with right hand until stretch felt without increased pain  Verbal review of cervical isometrics with patient able to demonstrate proper hand placement for resistance x 1 rep each Verbal review of L shoulder ROM exercises clarifying that these are more related to her recent reverse TSA rather than her neck, with patient wanting to continue with these to help improve her shoulder function   05/06/23 THERAPEUTIC EXERCISE: to improve flexibility, strength and mobility.  Verbal and tactile cues throughout for technique.  UBE: L1.0 x 4 min (backwards) Seated YTB scap retraction into pool noodle + B shoulder horiz ABD x 10 Seated YTB scap retraction into pool noodle + B shoulder horiz ABD diagonals (small angle) x 10 Seated YTB scap retraction into pool noodle + B shoulder ER x 10 Seated YTB scap retraction/depression with lat pull down x 10 Seated YTB scap retraction/depression with shoulder row x 10 Seated YTB scap retraction/depression with shoulder extension x 10 Seated YTB shoulder/scap protraction x 10 Wall push-up on orange Pball x 5 - discontinue  d/t increased neck pain Ball on wall R/L shoulder protraction + small CW/CCW circles x 10 each direction L scapular clocks on wall with looped YTB at forearms x 5 Seated  reactive cervical isometrics with PT adding gentle tension through YTB x 10 each for B lateral flexion, extension and flexion - slight neck discomfort with flexion isometric but all others well tolerated   PATIENT EDUCATION:  Education details: progress with PT and ongoing PT POC  Person educated: Patient Education method: Explanation Education comprehension: verbalized understanding  HOME EXERCISE PROGRAM: Access Code: [email or web address] URL: [email or web address] Date: 05/10/2023 Prepared by: Glenetta Hew  Exercises - Seated Cervical Retraction  - 2-3 x daily - 7 x weekly - 2 sets - 10 reps - 3-5 sec hold - Shoulder Rolls in Sitting  - 2-3 x daily - 7 x weekly - 2 sets - 10 reps - 3 sec hold - Seated Scapular Retraction  - 2-3 x daily - 7 x weekly - 2 sets - 10 reps - 5 sec hold - Sternocleidomastoid Stretch  - 2-3 x daily - 7 x weekly - 3 reps - 30 sec hold - Seated Cervical Sidebending Stretch  - 1-3 x daily - 7 x weekly - 3 reps - 30 sec hold - Seated Levator Scapulae Stretch  - 1-3 x daily - 7 x weekly - 3 reps - 30 sec hold - Seated Thoracic Lumbar Extension  - 1 x daily - 7 x weekly - 2 sets - 10 reps - 3 sec hold - Standing Isometric Cervical Sidebending with Manual Resistance  - 1 x daily - 7 x weekly - 1 sets - 5 reps - 10 sec hold hold - Standing Isometric Cervical Flexion with Manual Resistance  - 1 x daily - 7 x weekly - 1 sets - 5 reps - 10 sec hold - Standing Isometric Cervical Extension with Manual Resistance  - 1 x daily - 7 x weekly - 1 sets - 5 reps - 10 sec hold - Seated Isometric Cervical Rotation  - 1 x daily - 7 x weekly - 1 sets - 5 reps - 10 sec hold - Standing Bilateral Low Shoulder Row with Anchored Resistance  - 1 x daily - 3-4 x weekly - 2 sets - 10 reps - 5 sec hold - Scapular Retraction with Resistance Advanced  - 1 x daily - 3-4 x weekly - 2 sets - 10 reps - 5 sec hold - Seated Shoulder Horizontal Abduction with Resistance  - 1 x daily - 3 x weekly -  2 sets - 10 reps - 3 sec hold - Seated Bilateral Shoulder External Rotation with Resistance at Wrists  - 1 x daily - 3 x weekly - 2 sets - 10 reps - 3 sec hold - Seated Shoulder Flexion Full Range (Mirrored)  - 1 x daily - 3-4 x weekly - 2 sets - 10 reps - 3 sec hold - Seated Single Arm Shoulder Abduction - Thumb Up  - 1 x daily - 3-4 x weekly - 2 sets - 10 reps - 3 sec hold - Shoulder Internal Rotation with Resistance (Mirrored)  - 1 x daily - 3 x weekly - 1-2 sets - 10 reps - 3 sec hold - Shoulder External Rotation with Anchored Resistance  - 1 x daily - 3 x weekly - 1-2 sets - 10 reps - 3 sec hold   ASSESSMENT:  CLINICAL IMPRESSION: Completed HEP review confirming updates made last visit and verbally reviewing remainder  of exercises with pt verbalizing good understanding and no questions/concerns. She continues to use her Theracane to address "knots" in her L posterior shoulder with TPs identified in several muscles today - addressed with MT, but deferred DN at pt request. Will complete final goal assessment in anticipation of transition to HEP at end of current POC. If time allows, may complete PD screen as it has been >6 months since she completed her last PT episode for Parkinson's disease.  OBJECTIVE IMPAIRMENTS: decreased activity tolerance, decreased endurance, decreased knowledge of condition, decreased mobility, difficulty walking, decreased ROM, decreased strength, hypomobility, increased fascial restrictions, impaired perceived functional ability, increased muscle spasms, impaired flexibility, impaired UE functional use, improper body mechanics, postural dysfunction, and pain.   ACTIVITY LIMITATIONS: carrying, lifting, standing, sleeping, transfers, bed mobility, bathing, toileting, dressing, reach over head, and hygiene/grooming  PARTICIPATION LIMITATIONS: meal prep, cleaning, laundry, driving, shopping, community activity, occupation, yard work, and church  PERSONAL FACTORS: Age,  Fitness, Past/current experiences, Time since onset of injury/illness/exacerbation, and 3+ comorbidities: Chronic neck pain, L shoulder advanced OA s/p reverse TSA on 10/14/22; R TKA 07/2019, B THR 2018 & 2017, posterior C1-2 cervical fusion 2018, rupture of flexor tendon of L hand with failed surgical repair 2020, Parkinson's, psoriatic arthritis, bladder cancer, HTN    are also affecting patient's functional outcome.   REHAB POTENTIAL: Good  CLINICAL DECISION MAKING: Evolving/moderate complexity  EVALUATION COMPLEXITY: Moderate   GOALS: Goals reviewed with patient? Yes  SHORT TERM GOALS: Target date: 03/21/2023, extended to 05/03/2023   Patient will be independent with initial HEP to improve outcomes and carryover.  Baseline: Established 03/03/23 Goal status: MET  03/31/23  2.  Patient will report 25% improvement in neck pain to improve QOL.   Baseline: 8-10/10 Goal status: PARTIALLY MET  05/03/23 - minimal to no change in pain per pt report; pain ratings averaging from 3-4/10 up to 5-6/10 in recent visits as compared to 8-10/10 at eval  LONG TERM GOALS: Target date: 04/18/2023, extended to 05/24/2023   Patient will be independent with ongoing/advanced HEP for self-management at home.  Baseline:  Goal status:  MET  05/16/23  2.  Patient will demonstrate improved posture to decrease muscle imbalance. Baseline: rounded shoulders, forward head, weight shift right, and head laterally shifted to R Goal status: IN PROGRESS  05/03/23 - still with significant forward head and flexed cervical and upper thoracic posture  3.  Patient will report 50-75% improvement in neck pain to improve QOL.  Baseline: 8-10/10 Goal status: IN PROGRESS  05/03/23 - minimal to no change in pain per pt report; pain ratings averaging 5-6/10 in recent visits as compared to 8-10/10 at eval  4.  Patient will demonstrate functional pain free cervical ROM for safety with driving.  Baseline: refer to above cervical ROM  table Goal status: IN PROGRESS  05/03/23 - ROM improving in most planes but still painful for al directions except flexion and L rotation  5.  Patient will report </= 37% on NDI to demonstrate improved functional ability.  Baseline: 26 / 50 = 52.0 % Goal status: MET  04/12/23 - 17 / 50 = 34.0 %  6  Patient will report >/= 50% improvement in her sleep due to decreased neck pain. Baseline: Sleep greatly disturbed for up to 3-5 hrs due to neck pain per NDI Goal status: PARTIALLY MET  04/12/23 - Sleep mildly disturbed for up to 1-2 hrs per NDI  7.  Patient will be able to resume attending church services  w/o limitation due to neck pain. Baseline: Has been unable to attend church Goal status: IN PROGRESS  04/25/23 - She has not been able to return to church d/t limited unsupported sitting tolerance as well as increased pain as she tries to turn her head to observe the services  8. Patient will be able to resume walking for exercise w/o limitation due to neck pain. Baseline: Has been unable to walk d/t pain Goal status: PARTIALLY MET  05/03/23 - She has attempting walking for exercise but tolerance remains variable d/t neck pain - she was able take a longer walk yesterday at a slow pace   PLAN:  PT FREQUENCY: 2x/week  PT DURATION: 8 weeks  PLANNED INTERVENTIONS: Therapeutic exercises, Therapeutic activity, Neuromuscular re-education, Balance training, Patient/Family education, Self Care, Joint mobilization, Dry Needling, Electrical stimulation, Spinal mobilization, Cryotherapy, Moist heat, Taping, Ultrasound, Ionotophoresis 4mg /ml Dexamethasone, Manual therapy, and Re-evaluation  PLAN FOR NEXT SESSION: complete final goal assessment and address any remaining questions/concerns re: anticipated transition to her HEP; if time allows, complete 33-month PD screen   Marry Guan, PT 05/16/2023, 2:13 PM

## 2023-05-17 DIAGNOSIS — L4050 Arthropathic psoriasis, unspecified: Secondary | ICD-10-CM | POA: Diagnosis not present

## 2023-05-18 ENCOUNTER — Encounter: Payer: Medicare Other | Admitting: Physical Therapy

## 2023-05-18 DIAGNOSIS — Z1231 Encounter for screening mammogram for malignant neoplasm of breast: Secondary | ICD-10-CM | POA: Diagnosis not present

## 2023-05-18 LAB — HM MAMMOGRAPHY

## 2023-05-23 ENCOUNTER — Encounter: Payer: Self-pay | Admitting: Physical Therapy

## 2023-05-23 ENCOUNTER — Ambulatory Visit: Payer: Medicare Other | Admitting: Physical Therapy

## 2023-05-23 ENCOUNTER — Encounter: Payer: Self-pay | Admitting: Family Medicine

## 2023-05-23 DIAGNOSIS — M6281 Muscle weakness (generalized): Secondary | ICD-10-CM

## 2023-05-23 DIAGNOSIS — M542 Cervicalgia: Secondary | ICD-10-CM

## 2023-05-23 DIAGNOSIS — M62838 Other muscle spasm: Secondary | ICD-10-CM | POA: Diagnosis not present

## 2023-05-23 DIAGNOSIS — R293 Abnormal posture: Secondary | ICD-10-CM | POA: Diagnosis not present

## 2023-05-23 NOTE — Therapy (Signed)
OUTPATIENT PHYSICAL THERAPY TREATMENT / DISCHARGE SUMMARY    Patient Name: Stephanie [name] MRN: [id number] DOB:1932/07/16, 89 y.o., female Today's Date: 05/23/2023   END OF SESSION:  PT End of Session - 05/23/23 0932     Visit Number 20    Number of Visits 20    Date for PT Re-Evaluation 05/24/23    Authorization Type Medicare & BCBS    PT Start Time 0932    PT Stop Time 1015    PT Time Calculation (min) 43 min    Activity Tolerance Patient tolerated treatment well    Behavior During Therapy [facility] for tasks assessed/performed                 Past Medical History:  Diagnosis Date   Allergy    Anemia    past hx    Arthropathy of cervical spine 11/27/2012   Right  Xray at [facility]  On 04/08/2016  AP, lateral, and lateral flexion and extension views of the cervical spine are submitted for evaluation.   No acute fracture identified in the cervical spine.   There is redemonstration of approximately 2 mm of C3 on C4 anterolisthesis in neutral which slightly increases in flexion relative to neutral and extension. Slight C5 on C6 retrolisthesis does not change i   Benign hypertension without congestive heart failure    Benign mole    right hcets mole, bleeds when dried with towel   Bilateral dry eyes    Bladder cancer (HCC) 10-26-2016   CAD (coronary artery disease), native coronary artery 04/2023   Coronary CTA showed a coronary calcium score to 46 with less than 25% left main, 25 to 49% proximal LAD, less than 25% left circumflex stenosis   Cataract    Cervical spondylosis    Cervicalgia    2 screws in place    Chronic kidney disease    Depression with anxiety 10/26/2016   prior to husbands death    Dyspnea    Gross hematuria    developed after first hip replacment, led to indicental finding of bladder tumor, bleeding now resolved    H/O total shoulder replacement, left    History of kidney stones    1970's   Hyperlipidemia    Left hand weakness    due to reinjury of  flxor tendon s/p tendon surgery march 12-2018   Lumbar stenosis    Malignant tumor of urinary bladder (HCC) 07/2016   Pre-stage I   OA (osteoarthritis)    Parkinson disease dx 2012   neurologist-  dr siddiqui at [facility]   Psoriatic arthritis [facility])     Dr Nickola Major , [place] rheum    Rupture of flexor tendon of hand 2020   right   Past Surgical History:  Procedure Laterality Date   BIOPSY MASS LEFT FIRST METACARPAL   06/23/2006   benign   CATARACT EXTRACTION W/ INTRAOCULAR LENS  IMPLANT, BILATERAL  1999 and 2003   CERVICAL FUSION  02/2017   c1-c2 ; reports she has 2 screws 2in long in place done at [facility] ; patient exhibits VERY LIMITED NECK ROM   COLONOSCOPY     COSMETIC SURGERY     CYSTOSCOPY W/ RETROGRADES Bilateral 09/22/2016   Procedure: CYSTOSCOPY WITH RETROGRADE PYELOGRAM;  Surgeon: Sebastian Ache, MD;  Location: [facility];  Service: Urology;  Laterality: Bilateral;   D & C HYSTEROSCOPY W/ RESECTION POLYP  07/11/2000   DILATION AND CURETTAGE OF UTERUS  1960's  EXCISION CYST AND DEBRIDEMENT RIGHT WIRST AND REMOVAL FORGEIGN BODY  01/09/2009   EYE SURGERY     FLEXOR TENDON REPAIR Left 12/12/2018   Procedure: REPAIR/TRANSFER FLEXOR DIGITORUM PROFUNDUS OF LEFT SMALL FINGER;  Surgeon: Cindee Salt, MD;  Location: [facility];  Service: Orthopedics;  Laterality: Left;   JOINT REPLACEMENT     LAPAROSCOPY  yrs ago   infertility    METACARPOPHALANGEAL JOINT ARTHRODESIS Right 05/25/1996   REVERSE SHOULDER ARTHROPLASTY Left 10/14/2022   Procedure: REVERSE SHOULDER ARTHROPLASTY;  Surgeon: Francena Hanly, MD;  Location: [facility];  Service: Orthopedics;  Laterality: Left;    SPINE SURGERY     TENDON REPAIR Left 01/15/2019   Hand   TONSILLECTOMY AND ADENOIDECTOMY  child   TOTAL HIP ARTHROPLASTY Left 07/07/2016   Procedure: LEFT TOTAL HIP ARTHROPLASTY ANTERIOR APPROACH;  Surgeon: Ollen Gross, MD;  Location: [facility];  Service: Orthopedics;   Laterality: Left;   TOTAL HIP ARTHROPLASTY Right 09/28/2017   Procedure: RIGHT TOTAL HIP ARTHROPLASTY ANTERIOR APPROACH;  Surgeon: Ollen Gross, MD;  Location: [facility];  Service: Orthopedics;  Laterality: Right;   TOTAL KNEE ARTHROPLASTY Right 07/30/2019   Procedure: TOTAL KNEE ARTHROPLASTY;  Surgeon: Ollen Gross, MD;  Location: [facility];  Service: Orthopedics;  Laterality: Right;    TRANSURETHRAL RESECTION OF BLADDER TUMOR N/A 09/22/2016   Procedure: TRANSURETHRAL RESECTION OF BLADDER TUMOR (TURBT);  Surgeon: Sebastian Ache, MD;  Location: [facility];  Service: Urology;  Laterality: N/A;   Patient Active Problem List   Diagnosis Date Noted   CAD (coronary artery disease), native coronary artery 04/20/2023   S/P reverse total shoulder arthroplasty, left 10/14/2022   Preoperative cardiovascular examination 10/04/2022   SOB (shortness of breath) 08/02/2022   Insomnia 08/02/2022   Hyponatremia 08/01/2022   Shoulder pain, left 09/14/2021   History of COVID-19 01/06/2021   Diarrhea 09/03/2020   Vitamin D deficiency 09/03/2020   OA (osteoarthritis) of knee 07/30/2019   Tinea corporis 06/08/2019   Psoriatic arthritis (HCC) 04/02/2019   Anemia 03/13/2018   Hot flashes 03/13/2018   S/P cervical spinal fusion 03/31/2017   Neck pain 12/12/2016   Depression with anxiety 10/17/2016   Bladder cancer (HCC) 10/17/2016   OA (osteoarthritis) of hip 07/07/2016   Spinal stenosis of lumbar region with radiculopathy 04/08/2016   Spondylolisthesis of lumbar region 04/08/2016   Spondylosis of cervical region without myelopathy or radiculopathy 04/08/2016   Preventative health care 01/18/2016   Low back pain 01/18/2016   History of chicken pox    Hyperlipidemia 12/15/2014   Allergic rhinitis 06/25/2014   Allergy to bee sting 06/10/2014   TMJ tenderness 11/16/2013   Other malaise and fatigue 05/07/2013   Arthropathy of cervical spine (HCC) 11/27/2012   Muscle spasms of neck  11/27/2012   Parkinson's disease 05/24/2012   Conjunctivochalasis 03/09/2012   Epiretinal membrane 03/09/2012   Hyperopia with astigmatism and presbyopia 03/09/2012   Pseudophakia 03/09/2012   HTN (hypertension) 08/31/2011   Right knee pain 08/31/2011   Benign hypertensive heart disease without heart failure 05/12/2011    PCP: Bradd Canary, MD   REFERRING PROVIDER: Romero Belling, MD   REFERRING DIAG: Multilevel cervical spondylosis, L neck & trapezial myofascial pain/contracture   THERAPY DIAG:  Cervicalgia  Abnormal posture  Muscle weakness (generalized)  Other muscle spasm  RATIONALE FOR EVALUATION AND TREATMENT: Rehabilitation  ONSET DATE: acute on chronic  NEXT MD VISIT: PRN   SUBJECTIVE:  SUBJECTIVE STATEMENT: Pt reports she woke up with 5/10 pain in her neck.  PAIN: Are you having pain? Yes: NPRS scale: 2-3/10 at rest, with movement up to 5/10 Pain location: L neck  Pain description: more "sore" than pain, aching in L shoulder  Aggravating factors: any movement of head and neck, dyskinesia from PD meds Relieving factors: sitting still in her recliner, TENS  PERTINENT HISTORY:  Chronic neck pain, L shoulder advanced OA s/p reverse TSA on 10/14/22; R TKA 07/2019, B THR 2018 & 2017, posterior C1-2 cervical fusion 2018, rupture of flexor tendon of L hand with failed surgical repair 2020, Parkinson's, psoriatic arthritis, bladder cancer, HTN    PRECAUTIONS: None  HAND DOMINANCE: Right  WEIGHT BEARING RESTRICTIONS: No  FALLS:  Has patient fallen in last 6 months? No  LIVING ENVIRONMENT: Lives with: lives alone Lives in: House/apartment Stairs: Yes: External: 4 steps; on right going up, on left going up, and can reach both Has following equipment at home:  Single point cane, Walker - 2 wheeled, Shower bench, bed side commode, and Grab bars  OCCUPATION: Retired  [employer]: Independent and Leisure: volunteering at [employer], bible study, walking 1/3 mile daily - unable recently due to pain   PATIENT GOALS: "Get rid of this pain so I can go back to my normal activities."   OBJECTIVE: (objective measures completed at initial evaluation unless otherwise dated)  DIAGNOSTIC FINDINGS:  Cervical MRI - results not available, but pt reports she was told she had severe OA  02/07/23 - Cervical spine x-ray: IMPRESSION: Multilevel degenerative change and postoperative change without acute abnormality.  PATIENT SURVEYS:  NDI 26 / 50 = 52.0 % ; 17 / 50 = 34.0 % (04/12/23); 16 / 50 = 32.0 % (05/23/23)  COGNITION: Overall cognitive status: Within functional limits for tasks assessed  SENSATION: WFL  POSTURE:  rounded shoulders, forward head, weight shift right, and head laterally shifted to R  PALPATION: Severe increased muscle tension and TTP in L>R UT, LS and cervical paraspinals as well as L periscapular muscles   CERVICAL ROM:   Active ROM eval 04/12/23 05/03/23 05/23/23  Flexion 46 41 42 48  Extension 22 * 30 * 35 * 36  Right lateral flexion 16 * 12 * 20 * 22  Left lateral flexion 10 * 20 * 29 * 18  Right rotation 16 * 35 * 50 * 38  Left rotation 18* 25 * 36 37 *   (Blank rows = not tested, * = pain) 02/21/23 (eval) - clicking with B rotation; 05/23/23 - clicking with L rotation  UPPER EXTREMITY ROM:  Active ROM Right  eval Left    eval Right 04/12/23 Left 04/12/23 Right 05/03/23 Left 05/03/23  Shoulder flexion 146 111 156 135 162 129  Shoulder extension 50 42 55 38 56 42  Shoulder abduction 155 118 178 125 181 120  Shoulder adduction        Shoulder internal rotation FIR to sacrum FIR to lateral buttock FIR L5 FIR to lateral buttock FIR WNL FIR to lateral buttock  Shoulder external rotation FER T3 FER T1 FER T3 FER T1 FER T3 FER T2  Elbow flexion         Elbow extension        Wrist flexion        Wrist extension        Wrist ulnar deviation        Wrist radial deviation        Wrist  pronation        Wrist supination         (Blank rows = not tested)  UPPER EXTREMITY MMT:   MMT Right 03/03/23 Left 03/03/23 Right 04/12/23 Left 04/12/23 Right 05/03/23 Left* 05/03/23  Shoulder flexion 4+ 4- 5 4, pain in scapula 5 4+  Shoulder extension 4+ 4+ 5 4+, clicking in neck 5 4+  Shoulder abduction 4+ 4 5 4, pain in L lat neck 5 4  Shoulder adduction        Shoulder internal rotation 4+ 4 4+ 4+ 4+ 4+  Shoulder external rotation 4 4 4+ 4 4+ 4   (Blank rows = not tested) * 05/03/23 - pain at R scapula with all resisted motions  CERVICAL SPECIAL TESTS:  Spurling's test: Positive and Distraction test: Positive   TODAY'S TREATMENT:   05/23/23 THERAPEUTIC EXERCISE: to improve flexibility, strength and mobility.  Verbal and tactile cues throughout for technique.  UBE: L1.0 x 4 min (backwards)  THERAPEUTIC ACTIVITIES: NDI: 16 / 50 = 32.0 % Cervical ROM Goal assessment   PHYSICAL THERAPY PARKINSON'S DISEASE SCREEN  TUG - Timed Up and Go test: 10.21 sec (Normal), 10.75 sec (Manual), 12.78 sec (Cognitive) Baseline as of last PT episode: 10.78 sec (Normal), 10.59 sec (Manual), 12.25 sec (Cognitive)  10 meter walk test: 9.78 sec; Gait speed = 3.35 ft/sec Baseline as of last PT episode: 10.34 sec, Gait speed: 3.17 ft/sec   5 time sit to stand test: 13.22 sec Baseline as of D/C from last PT episode: 12.31 sec   05/16/23 THERAPEUTIC EXERCISE: to improve flexibility, strength and mobility.  Verbal and tactile cues throughout for technique.  UBE: L1.0 x 4 min (backwards) Seated L UT stretch 2 x 30" with active L shoulder and scapula retraction/depression + slight overpressure with right hand until stretch felt without increased pain  Seated L LS stretch 3 x 30" with active L shoulder and scapula retraction/depression + slight overpressure with right  hand until stretch felt without increased pain  Thoracic extension mobs over back of chair with arms crossed on chest 10 x 5" Completed HEP review with pt verbalizing good understanding.  MANUAL THERAPY: To promote normalized muscle tension, improved flexibility, improved joint mobility, increased ROM, and reduced pain. STM/DTM and manual TPR to L infraspinatus, rhomboids, LS, UT and teres group Gentle L scapular mobs into retraction and depression   05/10/23 THERAPEUTIC EXERCISE: to improve flexibility, strength and mobility.  Verbal and tactile cues throughout for technique.  UBE: L1.0 x 4 min (backwards) Initiated HEP review: Seated cervical retraction 10 x 3" Reverse shoulder rolls in sitting x 10   Seated scapular retraction 10 x 3" Seated L/R SCM stretch 2 x 30" - clarification provided for hand placement on clavicle to stabilize distal muscle attachment during stretch along with targeted area where she should expect to feel stretch Seated L UT stretch x 30" with hand under edge of seat (patient having difficulty with grip due to hand weakness/tendon injury), 2 x 30" with towel over shoulder to create shoulder and scapular depression, 2 x 30" with active L shoulder and scapula retraction/depression + slight overpressure with right hand until stretch felt without increased pain (patient noting latter version of the stretch most effective w/o triggering increased pain) Seated L LS stretch 3 x 30" with active L shoulder and scapula retraction/depression + slight overpressure with right hand until stretch felt without increased pain  Verbal review of cervical isometrics with patient able to demonstrate proper  hand placement for resistance x 1 rep each Verbal review of L shoulder ROM exercises clarifying that these are more related to her recent reverse TSA rather than her neck, with patient wanting to continue with these to help improve her shoulder function   PATIENT EDUCATION:  Education  details: recommended frequency for ongoing HEP at discharge to prevent loss of gains achieved with PT, 6-mo PD screen results, and re-screen in 6 months if not referred back to PT when she sees neurologist in November   Person educated: Patient Education method: Explanation Education comprehension: verbalized understanding  HOME EXERCISE PROGRAM: Access Code: [email or web address] URL: [email or web address] Date: 05/10/2023 Prepared by: Glenetta Hew  Exercises - Seated Cervical Retraction  - 2-3 x daily - 7 x weekly - 2 sets - 10 reps - 3-5 sec hold - Shoulder Rolls in Sitting  - 2-3 x daily - 7 x weekly - 2 sets - 10 reps - 3 sec hold - Seated Scapular Retraction  - 2-3 x daily - 7 x weekly - 2 sets - 10 reps - 5 sec hold - Sternocleidomastoid Stretch  - 2-3 x daily - 7 x weekly - 3 reps - 30 sec hold - Seated Cervical Sidebending Stretch  - 1-3 x daily - 7 x weekly - 3 reps - 30 sec hold - Seated Levator Scapulae Stretch  - 1-3 x daily - 7 x weekly - 3 reps - 30 sec hold - Seated Thoracic Lumbar Extension  - 1 x daily - 7 x weekly - 2 sets - 10 reps - 3 sec hold - Standing Isometric Cervical Sidebending with Manual Resistance  - 1 x daily - 7 x weekly - 1 sets - 5 reps - 10 sec hold hold - Standing Isometric Cervical Flexion with Manual Resistance  - 1 x daily - 7 x weekly - 1 sets - 5 reps - 10 sec hold - Standing Isometric Cervical Extension with Manual Resistance  - 1 x daily - 7 x weekly - 1 sets - 5 reps - 10 sec hold - Seated Isometric Cervical Rotation  - 1 x daily - 7 x weekly - 1 sets - 5 reps - 10 sec hold - Standing Bilateral Low Shoulder Row with Anchored Resistance  - 1 x daily - 3-4 x weekly - 2 sets - 10 reps - 5 sec hold - Scapular Retraction with Resistance Advanced  - 1 x daily - 3-4 x weekly - 2 sets - 10 reps - 5 sec hold - Seated Shoulder Horizontal Abduction with Resistance  - 1 x daily - 3 x weekly - 2 sets - 10 reps - 3 sec hold - Seated Bilateral Shoulder  External Rotation with Resistance at Wrists  - 1 x daily - 3 x weekly - 2 sets - 10 reps - 3 sec hold - Seated Shoulder Flexion Full Range (Mirrored)  - 1 x daily - 3-4 x weekly - 2 sets - 10 reps - 3 sec hold - Seated Single Arm Shoulder Abduction - Thumb Up  - 1 x daily - 3-4 x weekly - 2 sets - 10 reps - 3 sec hold - Shoulder Internal Rotation with Resistance (Mirrored)  - 1 x daily - 3 x weekly - 1-2 sets - 10 reps - 3 sec hold - Shoulder External Rotation with Anchored Resistance  - 1 x daily - 3 x weekly - 1-2 sets - 10 reps - 3 sec hold   ASSESSMENT:  CLINICAL IMPRESSION: Taitum reports  minimal change in her neck pain, although her actual pain ratings and patient outcome measures do indicate improvement since start of PT.  Overall she has demonstrated improvement in her cervical ROM, however pain still present through most motions.  She denies limitations in functional ROM while driving but still experiences limitations with walking for exercise and her ability to sit through church services.  Majority of PT goals now met or partially met and no further benefit noted from more recent therapeutic interventions, therefore will proceed with transition to her HEP and discharge from physical therapy for this episode.  Tristy verbalizing understanding of current plan.  She did verbalize desire to complete the overdue 10-month PD screen with the findings not demonstrating any indications of a decline at present, hence patient does not require further Physical Therapy services at this time.  Recommend repeat Physical Therapy PD screen in 6 months or sooner if Stephany is experiencing a perceived decline in functional mobility - she acknowledges understanding and will f/u with her neurologist, Harriet Pho, MD, on 09/28/23.  OBJECTIVE IMPAIRMENTS: decreased activity tolerance, decreased endurance, decreased knowledge of condition, decreased mobility, difficulty walking, decreased ROM, decreased  strength, hypomobility, increased fascial restrictions, impaired perceived functional ability, increased muscle spasms, impaired flexibility, impaired UE functional use, improper body mechanics, postural dysfunction, and pain.   ACTIVITY LIMITATIONS: carrying, lifting, standing, sleeping, transfers, bed mobility, bathing, toileting, dressing, reach over head, and hygiene/grooming  PARTICIPATION LIMITATIONS: meal prep, cleaning, laundry, driving, shopping, community activity, occupation, yard work, and church  PERSONAL FACTORS: Age, Fitness, Past/current experiences, Time since onset of injury/illness/exacerbation, and 3+ comorbidities: Chronic neck pain, L shoulder advanced OA s/p reverse TSA on 10/14/22; R TKA 07/2019, B THR 2018 & 2017, posterior C1-2 cervical fusion 2018, rupture of flexor tendon of L hand with failed surgical repair 2020, Parkinson's, psoriatic arthritis, bladder cancer, HTN    are also affecting patient's functional outcome.   REHAB POTENTIAL: Good  CLINICAL DECISION MAKING: Evolving/moderate complexity  EVALUATION COMPLEXITY: Moderate   GOALS: Goals reviewed with patient? Yes  SHORT TERM GOALS: Target date: 03/21/2023, extended to 05/03/2023   Patient will be independent with initial HEP to improve outcomes and carryover.  Baseline: Established 03/03/23 Goal status: MET  03/31/23  2.  Patient will report 25% improvement in neck pain to improve QOL.   Baseline: 8-10/10 Goal status: PARTIALLY MET  05/03/23 - minimal to no change in pain per pt report; pain ratings averaging from 3-4/10 up to 5-6/10 in recent visits as compared to 8-10/10 at eval  LONG TERM GOALS: Target date: 04/18/2023, extended to 05/24/2023   Patient will be independent with ongoing/advanced HEP for self-management at home.  Baseline:  Goal status:  MET  05/16/23  2.  Patient will demonstrate improved posture to decrease muscle imbalance. Baseline: rounded shoulders, forward head, weight shift right, and  head laterally shifted to R Goal status: MET  05/23/23 - still with significant forward head and flexed cervical and upper thoracic posture  3.  Patient will report 50-75% improvement in neck pain to improve QOL.  Baseline: 8-10/10 Goal status: NOT MET  05/23/23 - minimal to no change in pain per pt report; pain ratings averaging ~5/10 in recent visits as compared to 8-10/10 at eval  4.  Patient will demonstrate functional pain free cervical ROM for safety with driving.  Baseline: refer to above cervical ROM table Goal status: PARTIALLY MET  05/23/23 - pt feels like she is able to turn adequately to drive but  does check twice  5.  Patient will report </= 37% on NDI to demonstrate improved functional ability.  Baseline: 26 / 50 = 52.0 % Goal status: MET  04/12/23 - 17 / 50 = 34.0 %; 05/23/23 - 16 / 50 = 32.0 %  6  Patient will report >/= 50% improvement in her sleep due to decreased neck pain. Baseline: Sleep greatly disturbed for up to 3-5 hrs due to neck pain per NDI Goal status: PARTIALLY MET  05/23/23 - Sleep mildly disturbed for up to 2-3 hrs per NDI  7.  Patient will be able to resume attending church services w/o limitation due to neck pain. Baseline: Has been unable to attend church Goal status: MET  05/23/23 - She has not been able to return to church d/t limited unsupported sitting tolerance as well as increased pain as she tries to turn her head to observe the services  8. Patient will be able to resume walking for exercise w/o limitation due to neck pain. Baseline: Has been unable to walk d/t pain Goal status: PARTIALLY MET  05/23/23 - She has attempting walking for exercise but tolerance remains variable d/t neck pain (humid hot weather also limiting attempts recently)   PLAN:  PT FREQUENCY: 2x/week  PT DURATION: 8 weeks  PLANNED INTERVENTIONS: Therapeutic exercises, Therapeutic activity, Neuromuscular re-education, Balance training, Patient/Family education, Self Care, Joint  mobilization, Dry Needling, Electrical stimulation, Spinal mobilization, Cryotherapy, Moist heat, Taping, Ultrasound, Ionotophoresis 4mg /ml Dexamethasone, Manual therapy, and Re-evaluation  PLAN FOR NEXT SESSION: transition to HEP + D/C from PT, 15-month PD screen if she has not returned to PT for PD in the meantime  PHYSICAL THERAPY DISCHARGE SUMMARY  Visits from Start of Care: 20  Current functional level related to goals / functional outcomes: Refer to above clinical impression and goal assessment.   Remaining deficits: As above.   Education / Equipment: HEP, PD screen findings and recommendations   Patient agrees to discharge. Patient goals were partially met. Patient is being discharged due to maximized rehab potential.   Marry Guan, PT 05/23/2023, 12:43 PM

## 2023-05-27 NOTE — Telephone Encounter (Signed)
Patient reports her neck pain got worst after staring Zetia 10 mg daily. She also thinks physical therapy is aggravating the pain but she is done with her session. Suggest to try Crestor 5 mg twice week and Zetia 5 mg daily instead of 10 mg. Patient is willing to try and follow up call- aug 1st week

## 2023-05-29 NOTE — Assessment & Plan Note (Signed)
Well controlled, no changes to meds. Encouraged heart healthy diet such as the DASH diet and exercise as tolerated.  °

## 2023-05-29 NOTE — Assessment & Plan Note (Signed)
Following with cardiology. 

## 2023-05-29 NOTE — Assessment & Plan Note (Signed)
Tolerating statin, encouraged heart healthy diet, avoid trans fats, minimize simple carbs and saturated fats. Increase exercise as tolerated 

## 2023-05-29 NOTE — Assessment & Plan Note (Signed)
Supplement and monitor 

## 2023-05-29 NOTE — Progress Notes (Signed)
Subjective:    Patient ID: Stephanie [name], female    DOB: 1932/06/01, 89 y.o.   MRN: [id number]  Chief Complaint  Patient presents with   Follow-up    Follow up    HPI Discussed the use of AI scribe software for clinical note transcription with the patient, who gave verbal consent to proceed.  History of Present Illness   The patient, with a history of neck pain, weight loss, and high cholesterol, presents with ongoing neck pain despite undergoing physical therapy and receiving injections. The pain relief from these treatments has been temporary, lasting only a few days. The patient also reports weight loss, struggling to regain weight despite consuming protein bars and maintaining a diet that includes nuts and [place] yogurt. The patient's cholesterol levels have been a concern, with a recent recommendation to start a statin medication. However, the patient stopped taking Crestor due to increased pain. The patient also mentions feeling dizzy upon standing, with blood pressure readings on the lower side, raising concerns about the current blood pressure medication regimen. The patient's dog has a disc problem in its neck, which has been causing some distress for the patient.    Patient is an 89 yo female in today for follow up on chronic medical concerns. No recent febrile illness or hospitalizations. Denies CP/palp/SOB/HA/congestion/fevers/GI or GU c/o. Taking meds as prescribed     Past Medical History:  Diagnosis Date   Allergy    Anemia    past hx    Arthropathy of cervical spine 11/27/2012   Right  Xray at [facility]  On 04/08/2016  AP, lateral, and lateral flexion and extension views of the cervical spine are submitted for evaluation.   No acute fracture identified in the cervical spine.   There is redemonstration of approximately 2 mm of C3 on C4 anterolisthesis in neutral which slightly increases in flexion relative to neutral and extension. Slight C5 on C6 retrolisthesis does not change i    Benign hypertension without congestive heart failure    Benign mole    right hcets mole, bleeds when dried with towel   Bilateral dry eyes    Bladder cancer (HCC) 18-Oct-2016   CAD (coronary artery disease), native coronary artery 04/2023   Coronary CTA showed a coronary calcium score to 46 with less than 25% left main, 25 to 49% proximal LAD, less than 25% left circumflex stenosis   Cataract    Cervical spondylosis    Cervicalgia    2 screws in place    Chronic kidney disease    Depression with anxiety 18-Oct-2016   prior to husbands death    Dyspnea    Gross hematuria    developed after first hip replacment, led to indicental finding of bladder tumor, bleeding now resolved    H/O total shoulder replacement, left    History of kidney stones    1970's   Hyperlipidemia    Left hand weakness    due to reinjury of flxor tendon s/p tendon surgery march 12-2018   Lumbar stenosis    Malignant tumor of urinary bladder (HCC) 07/2016   Pre-stage I   OA (osteoarthritis)    Parkinson disease dx 2012   neurologist-  dr siddiqui at [facility]   Psoriatic arthritis [facility])     Dr Nickola Major , [place] [facility]    Rupture of flexor tendon of hand 2020   right    Past Surgical History:  Procedure Laterality Date   BIOPSY MASS LEFT FIRST  METACARPAL   06/23/2006   benign   CATARACT EXTRACTION W/ INTRAOCULAR LENS  IMPLANT, BILATERAL  1999 and 2003   CERVICAL FUSION  02/2017   c1-c2 ; reports she has 2 screws 2in long in place done at [facility] ; patient exhibits VERY LIMITED NECK ROM   COLONOSCOPY     COSMETIC SURGERY     CYSTOSCOPY W/ RETROGRADES Bilateral 09/22/2016   Procedure: CYSTOSCOPY WITH RETROGRADE PYELOGRAM;  Surgeon: Sebastian Ache, MD;  Location: [facility];  Service: Urology;  Laterality: Bilateral;   D & C HYSTEROSCOPY W/ RESECTION POLYP  07/11/2000   DILATION AND CURETTAGE OF UTERUS  1960's   EXCISION CYST AND DEBRIDEMENT RIGHT WIRST AND REMOVAL FORGEIGN BODY   01/09/2009   EYE SURGERY     FLEXOR TENDON REPAIR Left 12/12/2018   Procedure: REPAIR/TRANSFER FLEXOR DIGITORUM PROFUNDUS OF LEFT SMALL FINGER;  Surgeon: Cindee Salt, MD;  Location: [facility];  Service: Orthopedics;  Laterality: Left;   JOINT REPLACEMENT     LAPAROSCOPY  yrs ago   infertility    METACARPOPHALANGEAL JOINT ARTHRODESIS Right 05/25/1996   REVERSE SHOULDER ARTHROPLASTY Left 10/14/2022   Procedure: REVERSE SHOULDER ARTHROPLASTY;  Surgeon: Francena Hanly, MD;  Location: [facility];  Service: Orthopedics;  Laterality: Left;    SPINE SURGERY     TENDON REPAIR Left 01/15/2019   Hand   TONSILLECTOMY AND ADENOIDECTOMY  child   TOTAL HIP ARTHROPLASTY Left 07/07/2016   Procedure: LEFT TOTAL HIP ARTHROPLASTY ANTERIOR APPROACH;  Surgeon: Ollen Gross, MD;  Location: [facility];  Service: Orthopedics;  Laterality: Left;   TOTAL HIP ARTHROPLASTY Right 09/28/2017   Procedure: RIGHT TOTAL HIP ARTHROPLASTY ANTERIOR APPROACH;  Surgeon: Ollen Gross, MD;  Location: [facility];  Service: Orthopedics;  Laterality: Right;   TOTAL KNEE ARTHROPLASTY Right 07/30/2019   Procedure: TOTAL KNEE ARTHROPLASTY;  Surgeon: Ollen Gross, MD;  Location: [facility];  Service: Orthopedics;  Laterality: Right;    TRANSURETHRAL RESECTION OF BLADDER TUMOR N/A 09/22/2016   Procedure: TRANSURETHRAL RESECTION OF BLADDER TUMOR (TURBT);  Surgeon: Sebastian Ache, MD;  Location: [facility];  Service: Urology;  Laterality: N/A;    Family History  Problem Relation Age of Onset   Arthritis Mother    Hypertension Mother    Deep vein thrombosis Mother        recurrent, secondary to Bleeding disorder   Arthritis Father    Stroke Father    Bladder Cancer Father    Diabetes Sister    Heart disease Sister    Obesity Sister    Arthritis Sister    Hypertension Sister    Heart disease Maternal Grandmother    Heart disease Maternal Grandfather    Peripheral vascular disease Paternal  Grandmother        s/p leg amputation   Stroke Paternal Grandfather    Esophageal cancer Paternal Aunt    Hearing loss Sister    Colon cancer Neg Hx    Colon polyps Neg Hx    Rectal cancer Neg Hx    Stomach cancer Neg Hx     Social History   Socioeconomic History   Marital status: Widowed    Spouse name: Not on file   Number of children: Not on file   Years of education: Not on file   Highest education level: Bachelor's degree (e.g., BA, AB, BS)  Occupational History   Not on file  Tobacco Use   Smoking status: Former    Current  packs/day: 0.00    Types: Cigarettes    Start date: 05/04/1954    Quit date: 05/04/1964    Years since quitting: 59.1   Smokeless tobacco: Never   Tobacco comments:    Pt states that when she was smoking 1 pack of cigs would last her 3 days. 10/13/22 ALS   Vaping Use   Vaping status: Never Used  Substance and Sexual Activity   Alcohol use: Yes    Alcohol/week: 7.0 standard drinks of alcohol    Types: 7 Glasses of wine per week    Comment: ocassionally- social    Drug use: No   Sexual activity: Not Currently    Partners: Male  Other Topics Concern   Not on file  Social History Narrative   Not on file   Social Determinants of Health   Financial Resource Strain: Low Risk  (04/28/2023)   Overall Financial Resource Strain (CARDIA)    Difficulty of Paying Living Expenses: Not hard at all  Food Insecurity: No Food Insecurity (04/28/2023)   Hunger Vital Sign    Worried About Running Out of Food in the Last Year: Never true    Ran Out of Food in the Last Year: Never true  Transportation Needs: No Transportation Needs (04/28/2023)   PRAPARE - Administrator, Civil Service (Medical): No    Lack of Transportation (Non-Medical): No  Physical Activity: Insufficiently Active (05/10/2023)   Exercise Vital Sign    Days of Exercise per Week: 1 day    Minutes of Exercise per Session: 10 min  Stress: Stress Concern Present (05/10/2023)    [employer] of Occupational Health - Occupational Stress Questionnaire    Feeling of Stress : To some extent  Social Connections: Moderately Integrated (04/28/2023)   Social Connection and Isolation Panel [NHANES]    Frequency of Communication with Friends and Family: More than three times a week    Frequency of Social Gatherings with Friends and Family: Once a week    Attends Religious Services: 1 to 4 times per year    Active Member of [employer] or Organizations: Yes    Attends Banker Meetings: 1 to 4 times per year    Marital Status: Widowed  Intimate Partner Violence: Patient Declined (10/14/2022)   Humiliation, Afraid, Rape, and Kick questionnaire    Fear of Current or Ex-Partner: Patient declined    Emotionally Abused: Patient declined    Physically Abused: Patient declined    Sexually Abused: Patient declined    Outpatient Medications Prior to Visit  Medication Sig Dispense Refill   amLODipine (NORVASC) 5 MG tablet TAKE 1 TABLET (5 MG TOTAL) BY MOUTH DAILY. 90 tablet 3   Apoaequorin (PREVAGEN PO) Take 1 tablet by mouth in the morning.     carbidopa-levodopa (SINEMET IR) 25-100 MG per tablet Take 0.5-1 tablets by mouth See admin instructions. Take 1 tablet by mouth in the morning upon waking & take 0.5 tablet by mouth every 2 hours thereafter for a total of 6 tablets daily.     Carbidopa-Levodopa ER (SINEMET CR) 25-100 MG tablet controlled release Take 1 tablet by mouth at bedtime.     cholecalciferol (VITAMIN D3) 25 MCG (1000 UNIT) tablet Take 1,000 Units by mouth in the morning.     cholestyramine (QUESTRAN) 4 g packet Take 1 packet (4 g total) by mouth 2 (two) times daily as needed. 30 each 2   Coenzyme Q10 (COQ10 PO) Take 1 tablet by mouth in the morning.  diazepam (VALIUM) 5 MG tablet TAKE 1 TABLET (5 MG TOTAL) BY MOUTH EVERY 12 (TWELVE) HOURS AS NEEDED. FOR ANXIETY 60 tablet 1   DIGESTIVE ENZYMES PO Take 1 capsule by mouth 3 (three) times daily before meals.  Active Enzymes by Leotis Shames     EPINEPHrine 0.3 mg/0.3 mL IJ SOAJ injection Inject 0.3 mg into the muscle as needed for anaphylaxis.     ezetimibe (ZETIA) 10 MG tablet Take 1 tablet (10 mg total) by mouth daily. 90 tablet 3   fluticasone (CUTIVATE) 0.05 % cream Apply 1 application topically daily as needed (scalp psoriasis).  1   golimumab (SIMPONI ARIA) 50 MG/4ML SOLN injection Inject 50 mg into the vein See admin instructions. Given every 2 months - Last given 06/28/2019     melatonin 5 MG TABS Take 5 mg by mouth at bedtime.     meloxicam (MOBIC) 15 MG tablet Take 15 mg by mouth daily as needed (joint pain (max 3-4 times/weekly)).     Multiple Vitamin (MULTIVITAMIN WITH MINERALS) TABS tablet Take 1 tablet by mouth in the morning. Centrum Silver     Polyethyl Glycol-Propyl Glycol (SYSTANE) 0.4-0.3 % SOLN Place 1-2 drops into both eyes 3 (three) times daily as needed (dry/irritated eyes.).     Probiotic Product (PROBIOTIC PO) Take 1 capsule by mouth in the morning and at bedtime. Restore Ultimate Probiotic     rosuvastatin (CRESTOR) 5 MG tablet Take 1 tablet (5 mg total) by mouth 2 (two) times a week. 30 tablet 1   tiZANidine (ZANAFLEX) 4 MG tablet Take 0.5-1 tablets (2-4 mg total) by mouth every 8 (eight) hours as needed for muscle spasms. 30 tablet 0   hydrochlorothiazide (HYDRODIURIL) 25 MG tablet Take 1 tablet (25 mg total) by mouth daily. 90 tablet 1   No facility-administered medications prior to visit.    Allergies  Allergen Reactions   Crestor [Rosuvastatin] Other (See Comments)    Myalgias    Cleocin [Clindamycin] Diarrhea   Codeine Nausea And Vomiting   Hornet Venom    Penicillins Itching and Swelling    Has patient had a PCN reaction causing immediate rash, facial/tongue/throat swelling, SOB or lightheadedness with hypotension: Yes Has patient had a PCN reaction causing severe rash involving mucus membranes or skin necrosis: No Has patient had a PCN reaction that required  hospitalization: No Has patient had a PCN reaction occurring within the last 10 years: No If all of the above answers are "NO", then may proceed with Cephalosporin use.    Vibramycin [Doxycycline] Diarrhea   Yellow Jacket Venom Swelling   Lodine [Etodolac] Rash   Tramadol Itching and Rash    Review of Systems  Constitutional:  Negative for fever and malaise/fatigue.  HENT:  Negative for congestion.   Eyes:  Negative for blurred vision.  Respiratory:  Negative for shortness of breath.   Cardiovascular:  Negative for chest pain, palpitations and leg swelling.  Gastrointestinal:  Negative for abdominal pain, blood in stool and nausea.  Genitourinary:  Negative for dysuria and frequency.  Musculoskeletal:  Positive for back pain and neck pain. Negative for falls.  Skin:  Negative for rash.  Neurological:  Positive for dizziness. Negative for loss of consciousness and headaches.  Endo/Heme/Allergies:  Negative for environmental allergies.  Psychiatric/Behavioral:  Negative for depression. The patient is not nervous/anxious.        Objective:    Physical Exam Constitutional:      General: She is not in acute distress.  Appearance: Normal appearance. She is well-developed. She is not toxic-appearing.  HENT:     Head: Normocephalic and atraumatic.     Right Ear: External ear normal.     Left Ear: External ear normal.     Nose: Nose normal.  Eyes:     General:        Right eye: No discharge.        Left eye: No discharge.     Conjunctiva/sclera: Conjunctivae normal.  Neck:     Thyroid: No thyromegaly.  Cardiovascular:     Rate and Rhythm: Normal rate and regular rhythm.     Heart sounds: Normal heart sounds. No murmur heard. Pulmonary:     Effort: Pulmonary effort is normal. No respiratory distress.     Breath sounds: Normal breath sounds.  Abdominal:     General: Bowel sounds are normal.     Palpations: Abdomen is soft.     Tenderness: There is no abdominal tenderness.  There is no guarding.  Musculoskeletal:        General: Normal range of motion.     Cervical back: Neck supple.  Lymphadenopathy:     Cervical: No cervical adenopathy.  Skin:    General: Skin is warm and dry.  Neurological:     Mental Status: She is alert and oriented to person, place, and time.  Psychiatric:        Mood and Affect: Mood normal.        Behavior: Behavior normal.        Thought Content: Thought content normal.        Judgment: Judgment normal.    BP 110/64 (BP Location: Left Arm, Patient Position: Sitting, Cuff Size: Normal)   Pulse 94   Temp 97.8 F (36.6 C) (Oral)   Resp 16   Ht 5\' 2"  (1.575 m)   Wt 108 lb 12.8 oz (49.4 kg)   LMP 01/13/2013 Comment: spotting-had benign endometrial biopsy   SpO2 96%   BMI 19.90 kg/m  Wt Readings from Last 3 Encounters:  05/30/23 108 lb 12.8 oz (49.4 kg)  03/30/23 111 lb 12.8 oz (50.7 kg)  02/28/23 111 lb 6.4 oz (50.5 kg)    Diabetic Foot Exam - Simple   No data filed    Lab Results  Component Value Date   WBC 8.3 05/30/2023   HGB 14.1 05/30/2023   HCT 42.7 05/30/2023   PLT 227.0 05/30/2023   GLUCOSE 109 (H) 05/30/2023   CHOL 140 05/30/2023   TRIG 95.0 05/30/2023   HDL 63.90 05/30/2023   LDLDIRECT 120.0 12/11/2014   LDLCALC 57 05/30/2023   ALT 9 05/30/2023   AST 25 05/30/2023   NA 130 (L) 05/30/2023   K 4.2 05/30/2023   CL 91 (L) 05/30/2023   CREATININE 0.93 05/30/2023   BUN 26 (H) 05/30/2023   CO2 28 05/30/2023   TSH 1.29 05/30/2023   INR 1.0 07/25/2019   HGBA1C 5.6 02/28/2023    Lab Results  Component Value Date   TSH 1.29 05/30/2023   Lab Results  Component Value Date   WBC 8.3 05/30/2023   HGB 14.1 05/30/2023   HCT 42.7 05/30/2023   MCV 91.0 05/30/2023   PLT 227.0 05/30/2023   Lab Results  Component Value Date   NA 130 (L) 05/30/2023   K 4.2 05/30/2023   CO2 28 05/30/2023   GLUCOSE 109 (H) 05/30/2023   BUN 26 (H) 05/30/2023   CREATININE 0.93 05/30/2023   BILITOT 0.9 05/30/2023  ALKPHOS 46 05/30/2023   AST 25 05/30/2023   ALT 9 05/30/2023   PROT 7.0 05/30/2023   ALBUMIN 4.8 05/30/2023   CALCIUM 10.1 05/30/2023   ANIONGAP 11 11/12/2022   EGFR 77 03/30/2023   GFR 54.54 (L) 05/30/2023   Lab Results  Component Value Date   CHOL 140 05/30/2023   Lab Results  Component Value Date   HDL 63.90 05/30/2023   Lab Results  Component Value Date   LDLCALC 57 05/30/2023   Lab Results  Component Value Date   TRIG 95.0 05/30/2023   Lab Results  Component Value Date   CHOLHDL 2 05/30/2023   Lab Results  Component Value Date   HGBA1C 5.6 02/28/2023       Assessment & Plan:  Primary hypertension Assessment & Plan: Well controlled, no changes to meds. Encouraged heart healthy diet such as the DASH diet and exercise as tolerated.    Orders: -     CBC with Differential/Platelet -     Comprehensive metabolic panel -     TSH  Pure hypercholesterolemia Assessment & Plan: Tolerating statin, encouraged heart healthy diet, avoid trans fats, minimize simple carbs and saturated fats. Increase exercise as tolerated  Orders: -     Lipid panel  Spinal stenosis of lumbar region with radiculopathy Assessment & Plan: Encouraged moist heat and gentle stretching as tolerated. May try NSAIDs and prescription meds as directed and report if symptoms worsen or seek immediate care has recently been iengaged in physical therapy   Vitamin D deficiency Assessment & Plan: Supplement and monitor   Orders: -     VITAMIN D 25 Hydroxy (Vit-D Deficiency, Fractures)  Coronary artery disease involving native coronary artery, unspecified whether angina present, unspecified whether native or transplanted heart Assessment & Plan: Following with cardiology   Other orders -     hydroCHLOROthiazide; Take 1 capsule (12.5 mg total) by mouth daily.  Dispense: 90 capsule; Refill: 1    Assessment and Plan    Cervicalgia: Persistent despite physical therapy, injections, and dry  needling. No surgical intervention recommended by Dr. Venda Rodes. -Continue home exercises. -Return to Dr. Venda Rodes for further management.  Unintentional weight loss: Lost significant weight due to decreased appetite. Currently stable at 108.8 lbs. -Increase protein intake with protein bars and potentially milkshakes or yogurt-based smoothies. -Continue to monitor weight.  Hyperlipidemia: LDL slightly elevated at 78. Patient experienced muscle pain with Crestor. -Discontinue Crestor due to myalgias. -Start Zetia half dose. -Recheck lipid panel in two weeks.  Hypertension: Patient experiences dizziness, possibly related to low blood pressure. -Reduce HCTZ from 25mg  to 12.5mg  daily. -Check blood pressure in one month.  Coronary Artery Disease: Minimal stenosis noted on recent imaging. -Continue current management. -Consider further evaluation if symptoms develop.  General Health Maintenance: -Order comprehensive blood work today. -Follow-up in a few months or sooner if symptoms worsen.         Danise Edge, MD

## 2023-05-29 NOTE — Assessment & Plan Note (Signed)
Encouraged moist heat and gentle stretching as tolerated. May try NSAIDs and prescription meds as directed and report if symptoms worsen or seek immediate care has recently been iengaged in physical therapy

## 2023-05-30 ENCOUNTER — Ambulatory Visit: Payer: Medicare Other | Admitting: Family Medicine

## 2023-05-30 VITALS — BP 110/64 | HR 94 | Temp 97.8°F | Resp 16 | Ht 62.0 in | Wt 108.8 lb

## 2023-05-30 DIAGNOSIS — E559 Vitamin D deficiency, unspecified: Secondary | ICD-10-CM

## 2023-05-30 DIAGNOSIS — I10 Essential (primary) hypertension: Secondary | ICD-10-CM | POA: Diagnosis not present

## 2023-05-30 DIAGNOSIS — M5416 Radiculopathy, lumbar region: Secondary | ICD-10-CM | POA: Diagnosis not present

## 2023-05-30 DIAGNOSIS — E7800 Pure hypercholesterolemia, unspecified: Secondary | ICD-10-CM

## 2023-05-30 DIAGNOSIS — M48061 Spinal stenosis, lumbar region without neurogenic claudication: Secondary | ICD-10-CM

## 2023-05-30 DIAGNOSIS — I2510 Atherosclerotic heart disease of native coronary artery without angina pectoris: Secondary | ICD-10-CM | POA: Diagnosis not present

## 2023-05-30 LAB — CBC WITH DIFFERENTIAL/PLATELET
Basophils Absolute: 0 10*3/uL (ref 0.0–0.1)
Basophils Relative: 0.5 % (ref 0.0–3.0)
Eosinophils Absolute: 0.1 10*3/uL (ref 0.0–0.7)
Eosinophils Relative: 1.1 % (ref 0.0–5.0)
HCT: 42.7 % (ref 36.0–46.0)
Hemoglobin: 14.1 g/dL (ref 12.0–15.0)
Lymphocytes Relative: 21.9 % (ref 12.0–46.0)
Lymphs Abs: 1.8 10*3/uL (ref 0.7–4.0)
MCHC: 33.1 g/dL (ref 30.0–36.0)
MCV: 91 fl (ref 78.0–100.0)
Monocytes Absolute: 0.6 10*3/uL (ref 0.1–1.0)
Monocytes Relative: 7.1 % (ref 3.0–12.0)
Neutro Abs: 5.8 10*3/uL (ref 1.4–7.7)
Neutrophils Relative %: 69.4 % (ref 43.0–77.0)
Platelets: 227 10*3/uL (ref 150.0–400.0)
RBC: 4.69 Mil/uL (ref 3.87–5.11)
RDW: 13.5 % (ref 11.5–15.5)
WBC: 8.3 10*3/uL (ref 4.0–10.5)

## 2023-05-30 LAB — VITAMIN D 25 HYDROXY (VIT D DEFICIENCY, FRACTURES): VITD: 54.31 ng/mL (ref 30.00–100.00)

## 2023-05-30 LAB — TSH: TSH: 1.29 u[IU]/mL (ref 0.35–5.50)

## 2023-05-30 MED ORDER — HYDROCHLOROTHIAZIDE 12.5 MG PO CAPS: 12.5000 mg | ORAL_CAPSULE | Freq: Every day | ORAL | 1 refills | Status: DC

## 2023-05-30 NOTE — Patient Instructions (Signed)
Hypertension, Adult High blood pressure (hypertension) is when the force of blood pumping through the arteries is too strong. The arteries are the blood vessels that carry blood from the heart throughout the body. Hypertension forces the heart to work harder to pump blood and may cause arteries to become narrow or stiff. Untreated or uncontrolled hypertension can lead to a heart attack, heart failure, a stroke, kidney disease, and other problems. A blood pressure reading consists of a higher number over a lower number. Ideally, your blood pressure should be below 120/80. The first ("top") number is called the systolic pressure. It is a measure of the pressure in your arteries as your heart beats. The second ("bottom") number is called the diastolic pressure. It is a measure of the pressure in your arteries as the heart relaxes. What are the causes? The exact cause of this condition is not known. There are some conditions that result in high blood pressure. What increases the risk? Certain factors may make you more likely to develop high blood pressure. Some of these risk factors are under your control, including: Smoking. Not getting enough exercise or physical activity. Being overweight. Having too much fat, sugar, calories, or salt (sodium) in your diet. Drinking too much alcohol. Other risk factors include: Having a personal history of heart disease, diabetes, high cholesterol, or kidney disease. Stress. Having a family history of high blood pressure and high cholesterol. Having obstructive sleep apnea. Age. The risk increases with age. What are the signs or symptoms? High blood pressure may not cause symptoms. Very high blood pressure (hypertensive crisis) may cause: Headache. Fast or irregular heartbeats (palpitations). Shortness of breath. Nosebleed. Nausea and vomiting. Vision changes. Severe chest pain, dizziness, and seizures. How is this diagnosed? This condition is diagnosed by  measuring your blood pressure while you are seated, with your arm resting on a flat surface, your legs uncrossed, and your feet flat on the floor. The cuff of the blood pressure monitor will be placed directly against the skin of your upper arm at the level of your heart. Blood pressure should be measured at least twice using the same arm. Certain conditions can cause a difference in blood pressure between your right and left arms. If you have a high blood pressure reading during one visit or you have normal blood pressure with other risk factors, you may be asked to: Return on a different day to have your blood pressure checked again. Monitor your blood pressure at home for 1 week or longer. If you are diagnosed with hypertension, you may have other blood or imaging tests to help your health care provider understand your overall risk for other conditions. How is this treated? This condition is treated by making healthy lifestyle changes, such as eating healthy foods, exercising more, and reducing your alcohol intake. You may be referred for counseling on a healthy diet and physical activity. Your health care provider may prescribe medicine if lifestyle changes are not enough to get your blood pressure under control and if: Your systolic blood pressure is above 130. Your diastolic blood pressure is above 80. Your personal target blood pressure may vary depending on your medical conditions, your age, and other factors. Follow these instructions at home: Eating and drinking  Eat a diet that is high in fiber and potassium, and low in sodium, added sugar, and fat. An example of this eating plan is called the DASH diet. DASH stands for Dietary Approaches to Stop Hypertension. To eat this way: Eat   plenty of fresh fruits and vegetables. Try to fill one half of your plate at each meal with fruits and vegetables. Eat whole grains, such as whole-wheat pasta, brown rice, or whole-grain bread. Fill about one  fourth of your plate with whole grains. Eat or drink low-fat dairy products, such as skim milk or low-fat yogurt. Avoid fatty cuts of meat, processed or cured meats, and poultry with skin. Fill about one fourth of your plate with lean proteins, such as fish, chicken without skin, beans, eggs, or tofu. Avoid pre-made and processed foods. These tend to be higher in sodium, added sugar, and fat. Reduce your daily sodium intake. Many people with hypertension should eat less than 1,500 mg of sodium a day. Do not drink alcohol if: Your health care provider tells you not to drink. You are pregnant, may be pregnant, or are planning to become pregnant. If you drink alcohol: Limit how much you have to: 0-1 drink a day for women. 0-2 drinks a day for men. Know how much alcohol is in your drink. In the [place] one drink equals one 12 oz bottle of beer (355 mL), one 5 oz glass of wine (148 mL), or one 1 oz glass of hard liquor (44 mL). Lifestyle  Work with your health care provider to maintain a healthy body weight or to lose weight. Ask what an ideal weight is for you. Get at least 30 minutes of exercise that causes your heart to beat faster (aerobic exercise) most days of the week. Activities may include walking, swimming, or biking. Include exercise to strengthen your muscles (resistance exercise), such as Pilates or lifting weights, as part of your weekly exercise routine. Try to do these types of exercises for 30 minutes at least 3 days a week. Do not use any products that contain nicotine or tobacco. These products include cigarettes, chewing tobacco, and vaping devices, such as e-cigarettes. If you need help quitting, ask your health care provider. Monitor your blood pressure at home as told by your health care provider. Keep all follow-up visits. This is important. Medicines Take over-the-counter and prescription medicines only as told by your health care provider. Follow directions carefully. Blood  pressure medicines must be taken as prescribed. Do not skip doses of blood pressure medicine. Doing this puts you at risk for problems and can make the medicine less effective. Ask your health care provider about side effects or reactions to medicines that you should watch for. Contact a health care provider if you: Think you are having a reaction to a medicine you are taking. Have headaches that keep coming back (recurring). Feel dizzy. Have swelling in your ankles. Have trouble with your vision. Get help right away if you: Develop a severe headache or confusion. Have unusual weakness or numbness. Feel faint. Have severe pain in your chest or abdomen. Vomit repeatedly. Have trouble breathing. These symptoms may be an emergency. Get help right away. Call 911. Do not wait to see if the symptoms will go away. Do not drive yourself to the hospital. Summary Hypertension is when the force of blood pumping through your arteries is too strong. If this condition is not controlled, it may put you at risk for serious complications. Your personal target blood pressure may vary depending on your medical conditions, your age, and other factors. For most people, a normal blood pressure is less than 120/80. Hypertension is treated with lifestyle changes, medicines, or a combination of both. Lifestyle changes include losing weight, eating a healthy,   low-sodium diet, exercising more, and limiting alcohol. This information is not intended to replace advice given to you by your health care provider. Make sure you discuss any questions you have with your health care provider. Document Revised: 09/08/2021 Document Reviewed: 09/08/2021 Elsevier Patient Education  2024 [employer]vier Inc.  

## 2023-05-31 LAB — COMPREHENSIVE METABOLIC PANEL
ALT: 9 U/L (ref 0–35)
AST: 25 U/L (ref 0–37)
Albumin: 4.8 g/dL (ref 3.5–5.2)
Alkaline Phosphatase: 46 U/L (ref 39–117)
BUN: 26 mg/dL — ABNORMAL HIGH (ref 6–23)
CO2: 28 mEq/L (ref 19–32)
Calcium: 10.1 mg/dL (ref 8.4–10.5)
Chloride: 91 mEq/L — ABNORMAL LOW (ref 96–112)
Creatinine, Ser: 0.93 mg/dL (ref 0.40–1.20)
GFR: 54.54 mL/min — ABNORMAL LOW (ref >60.00)
Glucose, Bld: 109 mg/dL — ABNORMAL HIGH (ref 70–99)
Potassium: 4.2 mEq/L (ref 3.5–5.1)
Sodium: 130 mEq/L — ABNORMAL LOW (ref 135–145)
Total Bilirubin: 0.9 mg/dL (ref 0.2–1.2)
Total Protein: 7 g/dL (ref 6.0–8.3)

## 2023-05-31 LAB — LIPID PANEL
Cholesterol: 140 mg/dL (ref 0–200)
HDL: 63.9 mg/dL (ref >39.00)
LDL Cholesterol: 57 mg/dL (ref 0–99)
NonHDL: 76.23
Total CHOL/HDL Ratio: 2
Triglycerides: 95 mg/dL (ref 0.0–149.0)
VLDL: 19 mg/dL (ref 0.0–40.0)

## 2023-06-02 DIAGNOSIS — M47812 Spondylosis without myelopathy or radiculopathy, cervical region: Secondary | ICD-10-CM | POA: Diagnosis not present

## 2023-06-02 DIAGNOSIS — M791 Myalgia, unspecified site: Secondary | ICD-10-CM | POA: Diagnosis not present

## 2023-06-13 ENCOUNTER — Ambulatory Visit: Payer: Self-pay | Admitting: Licensed Clinical Social Worker

## 2023-06-13 NOTE — Patient Outreach (Signed)
  Care Coordination   Follow Up Visit Note   06/13/2023 Name: Stephanie [name] MRN: [id number] DOB: [birth year]  Stephanie [name] is a 89 y.o. year old female who sees Bradd Canary, MD for primary care. I spoke with  [name] by phone today.  What matters to the patients health and wellness today? Patient has ongoing neck pain issues. She was referred to neurologist to talk with her about neck pain issues    Goals Addressed             This Visit's Progress    Patient has ongoing neck pain issues and is talking with medical provider about managing pain issues       Interventions:  Spoke with client  via phone today about her status and needs Discussed program support for client with RN, LCSW, Pharmacist Discussed pain issues of client. She has neck pain issues. She was referred to a neurologist to address her neck pain issues. She is trying to arrange an appointment with neurologist . She said she is having decreased sleep due to neck pain issues She drives herself to medical appointments and to complete errands Discussed medication procurement Provided counseling support Ludia said she has reduced energy level. Neck pain issues affect her daily functioning Discussed mood of client. She feels that main issue is pain management at this time.  Discussed physical therapy sessions of client. Client said she has now completed physical therapy sessions for client Thanked client for phone call with LCSW.  Encouraged client to call LCSW as needed for SW support at [phone].           SDOH assessments and interventions completed:  Yes  SDOH Interventions Today    Flowsheet Row Most Recent Value  SDOH Interventions   Depression Interventions/Treatment  Counseling  Physical Activity Interventions Other (Comments)  [walking challenges]  Stress Interventions Provide Counseling  [has some stress in managing medical needs]        Care Coordination Interventions:  Yes,  provided   Interventions Today    Flowsheet Row Most Recent Value  Chronic Disease   Chronic disease during today's visit Other  [spoke with client about client needs]  General Interventions   General Interventions Discussed/Reviewed General Interventions Discussed, Community Resources  [discussed program support]  Exercise Interventions   Exercise Discussed/Reviewed Physical Activity  [neck pain issues]  Physical Activity Discussed/Reviewed Physical Activity Discussed  Education Interventions   Education Provided Provided Education  Provided Verbal Education On [employer] has completed physical therapy sessions]  Mental Health Interventions   Mental Health Discussed/Reviewed Coping Strategies, Anxiety  [client  has neck pain issues, Has difficulty sleeping. She is anxious related to managing medical needs]  Nutrition Interventions   Nutrition Discussed/Reviewed Nutrition Discussed  Pharmacy Interventions   Pharmacy Dicussed/Reviewed Pharmacy Topics Discussed        Follow up plan: Follow up call scheduled for 08/03/23 at 10:30 AM     Encounter Outcome:  Pt. Visit Completed   Kelton Pillar.Dola Lunsford MSW, LCSW Licensed Visual merchandiser [facility] Care Management [phone]

## 2023-06-13 NOTE — Patient Instructions (Signed)
Visit Information  Thank you for taking time to visit with me today. Please don't hesitate to contact me if I can be of assistance to you.   Following are the goals we discussed today:   Goals Addressed             This Visit's Progress    Patient has ongoing neck pain issues and is talking with medical provider about managing pain issues       Interventions:  Spoke with client  via phone today about her status and needs Discussed program support for client with RN, LCSW, Pharmacist Discussed pain issues of client. She has neck pain issues. She was referred to a neurologist to address her neck pain issues. She is trying to arrange an appointment with neurologist . She said she is having decreased sleep due to neck pain issues She drives herself to medical appointments and to complete errands Discussed medication procurement Provided counseling support Andreika said she has reduced energy level. Neck pain issues affect her daily functioning Discussed mood of client. She feels that main issue is pain management at this time.  Discussed physical therapy sessions of client. Client said she has now completed physical therapy sessions for client Thanked client for phone call with LCSW.  Encouraged client to call LCSW as needed for SW support at [phone].           Our next appointment is by telephone on 08/03/23 at 10:30 AM   Please call the care guide team at [phone] if you need to cancel or reschedule your appointment.   If you are experiencing a Mental Health or Behavioral Health Crisis or need someone to talk to, please go to [facility] [address], [place] [phone])   The patient verbalized understanding of instructions, educational materials, and care plan provided today and DECLINED offer to receive copy of patient instructions, educational materials, and care plan.   The patient has been provided with contact  information for the care management team and has been advised to call with any health related questions or concerns.   Kelton Pillar.Arlis Everly MSW, LCSW Licensed Visual merchandiser [facility] Care Management [phone]

## 2023-06-15 ENCOUNTER — Encounter (INDEPENDENT_AMBULATORY_CARE_PROVIDER_SITE_OTHER): Payer: Self-pay

## 2023-06-16 ENCOUNTER — Telehealth: Payer: Self-pay | Admitting: Family Medicine

## 2023-06-16 NOTE — Telephone Encounter (Signed)
Pt dropped document to be filled out by provider (parking Disability placard - in a white envelope already with address and stamp to send back to pt's address) Document put at front office tray under providers name. Please mail back to patient when document ready.

## 2023-06-16 NOTE — Telephone Encounter (Signed)
Form has been placed in provider bin to be signed Will mail it back when its ready.

## 2023-06-21 ENCOUNTER — Ambulatory Visit: Payer: Self-pay | Admitting: Licensed Clinical Social Worker

## 2023-06-21 NOTE — Patient Outreach (Signed)
  Care Coordination   Follow Up Visit Note   06/21/2023 Name: Stephanie [name] MRN: [id number] DOB: [birth year]  Stephanie [name] is a 89 y.o. year old female who sees Bradd Canary, MD for primary care. I spoke with  [name] by phone today.  What matters to the patients health and wellness today?  Patient has ongoing neck pain issues and  is talking with medical providers about managing pain issues    Goals Addressed             This Visit's Progress    Patient has ongoing neck pain issues and is talking with medical provider about managing pain issues       Interventions:  Spoke with client  via phone today about  managing her current issues Client said she has talked with several medical providers about pain issues. She was referred to neurologist. She said neurologist wanted her to see Parkinson's specialist.  She was also trying to talk with medical provider about use of Botox and Botox injections for pain control Discussed program support for client with RN, LCSW, Pharmacist Discussed pain issues of client. She has neck pain issues. She said she is having decreased sleep due to neck pain issues She drives herself to medical appointments and to complete errands Discussed medication procurement Provided counseling support Discussed physical therapy sessions of client. She said physical therapy sessions have now ended Thanked client for phone call with LCSW.  Encouraged client to call LCSW as needed for SW support at [phone].           SDOH assessments and interventions completed:  Yes  SDOH Interventions Today    Flowsheet Row Most Recent Value  SDOH Interventions   Depression Interventions/Treatment  Counseling  Physical Activity Interventions Other (Comments)  [has completed physical therapy sessions]  Stress Interventions Other (Comment)  [has stress related to managing medical needs]        Care Coordination Interventions:  Yes, provided    Interventions Today    Flowsheet Row Most Recent Value  Chronic Disease   Chronic disease during today's visit Other  [spoke with client about client needs]  General Interventions   General Interventions Discussed/Reviewed General Interventions Discussed, Community Resources  Exercise Interventions   Exercise Discussed/Reviewed Physical Activity  Education Interventions   Education Provided Provided Education  Provided Verbal Education On [employer]  Mental Health Interventions   Mental Health Discussed/Reviewed Coping Strategies  [mood reviewed. she has some stress related to managing pain issues]  Pharmacy Interventions   Pharmacy Dicussed/Reviewed Pharmacy Topics Discussed        Follow up plan: LCSW to call client as scheduled to assess client needs   Encounter Outcome:  Pt. Visit Completed   Kelton Pillar. MSW, LCSW Licensed Visual merchandiser [facility] Care Management [phone]

## 2023-06-21 NOTE — Patient Instructions (Signed)
Visit Information  Thank you for taking time to visit with me today. Please don't hesitate to contact me if I can be of assistance to you.   Following are the goals we discussed today:   Goals Addressed             This Visit's Progress    Patient has ongoing neck pain issues and is talking with medical provider about managing pain issues       Interventions:  Spoke with client  via phone today about  managing her current issues Client said she has talked with several medical providers about pain issues. She was referred to neurologist. She said neurologist wanted her to see Parkinson's specialist.  She was also trying to talk with medical provider about use of Botox and Botox injections for pain control Discussed program support for client with RN, LCSW, Pharmacist Discussed pain issues of client. She has neck pain issues. She said she is having decreased sleep due to neck pain issues She drives herself to medical appointments and to complete errands Discussed medication procurement Provided counseling support Discussed physical therapy sessions of client. She said physical therapy sessions have now ended Thanked client for phone call with LCSW.  Encouraged client to call LCSW as needed for SW support at [phone].          LCSW to call client as scheduled  to assess client needs   Please call the care guide team at [phone] if you need to cancel or reschedule your appointment.   If you are experiencing a Mental Health or Behavioral Health Crisis or need someone to talk to, please go to [facility] [address], [place] [phone])   The patient verbalized understanding of instructions, educational materials, and care plan provided today and DECLINED offer to receive copy of patient instructions, educational materials, and care plan.   The patient has been provided with contact information for the care management team and  has been advised to call with any health related questions or concerns.   Kelton Pillar. MSW, LCSW Licensed Visual merchandiser [facility] Care Management [phone]

## 2023-06-28 ENCOUNTER — Telehealth: Payer: Self-pay | Admitting: Cardiology

## 2023-06-28 NOTE — Telephone Encounter (Signed)
Spoke to patient, patient has been taking Zetia 5 mg daily only , unable to tolerate statin. Follow up lab is due Sept 5.  Lab has been ordered.

## 2023-06-28 NOTE — Telephone Encounter (Signed)
Patient is requesting to speak with Stephanie [name] [facility].

## 2023-06-30 ENCOUNTER — Ambulatory Visit: Payer: Medicare Other

## 2023-06-30 DIAGNOSIS — I10 Essential (primary) hypertension: Secondary | ICD-10-CM

## 2023-06-30 NOTE — Progress Notes (Signed)
BP Readings from Last 3 Encounters:  05/30/23 110/64  04/19/23 101/61  03/30/23 110/60   Patient here for bp check due to medication decreased for symptos of dizziness as documented 05/30/23: Hypertension: Patient experiences dizziness, possibly related to low blood pressure. -Reduce HCTZ from 25mg  to 12.5mg  daily. -Check blood pressure in one month.  BP today is 118 66 with a pulse of 85 and she reports she is feeling much better and has not been dizzy.

## 2023-07-12 DIAGNOSIS — R5383 Other fatigue: Secondary | ICD-10-CM | POA: Diagnosis not present

## 2023-07-12 DIAGNOSIS — L4050 Arthropathic psoriasis, unspecified: Secondary | ICD-10-CM | POA: Diagnosis not present

## 2023-07-12 DIAGNOSIS — Z79899 Other long term (current) drug therapy: Secondary | ICD-10-CM | POA: Diagnosis not present

## 2023-07-13 DIAGNOSIS — L409 Psoriasis, unspecified: Secondary | ICD-10-CM | POA: Diagnosis not present

## 2023-07-13 DIAGNOSIS — M154 Erosive (osteo)arthritis: Secondary | ICD-10-CM | POA: Diagnosis not present

## 2023-07-13 DIAGNOSIS — R42 Dizziness and giddiness: Secondary | ICD-10-CM | POA: Diagnosis not present

## 2023-07-13 DIAGNOSIS — Z6820 Body mass index (BMI) 20.0-20.9, adult: Secondary | ICD-10-CM | POA: Diagnosis not present

## 2023-07-13 DIAGNOSIS — Z79899 Other long term (current) drug therapy: Secondary | ICD-10-CM | POA: Diagnosis not present

## 2023-07-13 DIAGNOSIS — L4050 Arthropathic psoriasis, unspecified: Secondary | ICD-10-CM | POA: Diagnosis not present

## 2023-07-14 DIAGNOSIS — M542 Cervicalgia: Secondary | ICD-10-CM | POA: Diagnosis not present

## 2023-07-14 DIAGNOSIS — M791 Myalgia, unspecified site: Secondary | ICD-10-CM | POA: Diagnosis not present

## 2023-07-14 DIAGNOSIS — M47812 Spondylosis without myelopathy or radiculopathy, cervical region: Secondary | ICD-10-CM | POA: Diagnosis not present

## 2023-07-20 DIAGNOSIS — L84 Corns and callosities: Secondary | ICD-10-CM | POA: Diagnosis not present

## 2023-07-20 DIAGNOSIS — I739 Peripheral vascular disease, unspecified: Secondary | ICD-10-CM | POA: Diagnosis not present

## 2023-07-20 DIAGNOSIS — L603 Nail dystrophy: Secondary | ICD-10-CM | POA: Diagnosis not present

## 2023-07-21 DIAGNOSIS — Z79899 Other long term (current) drug therapy: Secondary | ICD-10-CM | POA: Diagnosis not present

## 2023-07-21 DIAGNOSIS — R5383 Other fatigue: Secondary | ICD-10-CM | POA: Diagnosis not present

## 2023-07-21 DIAGNOSIS — L4050 Arthropathic psoriasis, unspecified: Secondary | ICD-10-CM | POA: Diagnosis not present

## 2023-07-25 ENCOUNTER — Encounter: Payer: Self-pay | Admitting: Family Medicine

## 2023-07-28 DIAGNOSIS — Z23 Encounter for immunization: Secondary | ICD-10-CM | POA: Diagnosis not present

## 2023-08-03 ENCOUNTER — Encounter: Payer: Self-pay | Admitting: Licensed Clinical Social Worker

## 2023-08-10 ENCOUNTER — Ambulatory Visit: Payer: Self-pay | Admitting: Licensed Clinical Social Worker

## 2023-08-10 NOTE — Patient Instructions (Signed)
Visit Information  Thank you for taking time to visit with me today. Please don't hesitate to contact me if I can be of assistance to you.   Following are the goals we discussed today:   Goals Addressed             This Visit's Progress    Patient has ongoing neck pain issues and is talking with medical provider about managing pain issues       Interventions:  Spoke with client  via phone today about  managing her current needs and issues. She spoke of daily pain issues. Client said she has not gone to a Pain Clinic Client said she has talked with several medical providers about pain issues. She was referred to neurologist.  She is scheduled for appointment on 08/24/23 at [facility]  for discussion of Botox treatment. She said she has daily pain issues: neck pain and back pain Discussed program support for client with RN, LCSW, Pharmacist She said she is having decreased sleep due to neck pain issues She drives herself to medical appointments and to complete errands Discussed medication procurement. She has her prescribed medications Provided counseling support for client Discussed support from friends. She likes talking via phone with friends for support Discussed physical therapy sessions of client. She said physical therapy sessions have now ended Client takes Tylenol extra strength daily to help with pain issues Thanked client for phone call with LCSW.  Encouraged client to call LCSW as needed for SW support at [phone].          Client has name and phone number of LCSW [phone]).  Client to call LCSW as needed to discuss SW needs of client  Please call the care guide team at [phone] if you need to cancel or reschedule your appointment.   If you are experiencing a Mental Health or Behavioral Health Crisis or need someone to talk to, please go to [facility] [address], [place] [phone])   The patient  verbalized understanding of instructions, educational materials, and care plan provided today and DECLINED offer to receive copy of patient instructions, educational materials, and care plan.   The patient has been provided with contact information for the care management team and has been advised to call with any health related questions or concerns.   Kelton Pillar.Geneva Pallas MSW, LCSW Licensed Visual merchandiser [facility] Care Management [phone]

## 2023-08-10 NOTE — Patient Outreach (Signed)
Care Coordination   Follow Up Visit Note   08/10/2023 Name: Stephanie [name] MRN: [id number] DOB: 07-07-1932  Stephanie [name] is a 89 y.o. year old female who sees Bradd Canary, MD for primary care. I spoke with  [name] by phone today.  What matters to the patients health and wellness today?  Patient has ongoing neck pain issues and is talking with medical provider about managing pain issues    Goals Addressed             This Visit's Progress    Patient has ongoing neck pain issues and is talking with medical provider about managing pain issues       Interventions:  Spoke with client  via phone today about  managing her current needs and issues. She spoke of daily pain issues. Client said she has not gone to a Pain Clinic Client said she has talked with several medical providers about pain issues. She was referred to neurologist.  She is scheduled for appointment on 08/24/23 at [facility]  for discussion of Botox treatment. She said she has daily pain issues: neck pain and back pain Discussed program support for client with RN, LCSW, Pharmacist She said she is having decreased sleep due to neck pain issues She drives herself to medical appointments and to complete errands Discussed medication procurement. She has her prescribed medications Provided counseling support for client Discussed support from friends. She likes talking via phone with friends for support Discussed physical therapy sessions of client. She said physical therapy sessions have now ended Client takes Tylenol extra strength daily to help with pain issues Thanked client for phone call with LCSW.  Encouraged client to call LCSW as needed for SW support at [phone].           SDOH assessments and interventions completed:  Yes  SDOH Interventions Today    Flowsheet Row Most Recent Value  SDOH Interventions   Depression Interventions/Treatment  Counseling  Physical Activity  Interventions Other (Comments)  [pain in walking]  Stress Interventions Provide Counseling  [stress in managing medical needs]        Care Coordination Interventions:  Yes, provided  Interventions Today    Flowsheet Row Most Recent Value  Chronic Disease   Chronic disease during today's visit Other  [spoke with client about client needs]  General Interventions   General Interventions Discussed/Reviewed General Interventions Discussed, Community Resources  Exercise Interventions   Exercise Discussed/Reviewed Physical Activity  [has occasional pain in walking]  Physical Activity Discussed/Reviewed Physical Activity Discussed  Education Interventions   Education Provided Provided Education  Provided Verbal Education On Community Resources  Mental Health Interventions   Mental Health Discussed/Reviewed Coping Strategies  [trying to manage medical needs. Some anxiety issues. Trying to manage Parkinson's symptoms. Takes medications as prescribed]  Nutrition Interventions   Nutrition Discussed/Reviewed Nutrition Discussed  Pharmacy Interventions   Pharmacy Dicussed/Reviewed Pharmacy Topics Discussed  Safety Interventions   Safety Discussed/Reviewed Fall Risk        Follow up plan: Client has name and phone number of LCSW [phone]).  Client to call LCSW as needed to discuss SW needs of client.    Encounter Outcome:  Patient Visit Completed   Kelton Pillar.Laticia Vannostrand MSW, LCSW Licensed Visual merchandiser [facility] Care Management [phone]

## 2023-08-19 DIAGNOSIS — Z961 Presence of intraocular lens: Secondary | ICD-10-CM | POA: Diagnosis not present

## 2023-08-19 DIAGNOSIS — H26492 Other secondary cataract, left eye: Secondary | ICD-10-CM | POA: Diagnosis not present

## 2023-08-19 DIAGNOSIS — H16223 Keratoconjunctivitis sicca, not specified as Sjogren's, bilateral: Secondary | ICD-10-CM | POA: Diagnosis not present

## 2023-08-19 DIAGNOSIS — H35371 Puckering of macula, right eye: Secondary | ICD-10-CM | POA: Diagnosis not present

## 2023-08-19 DIAGNOSIS — H5310 Unspecified subjective visual disturbances: Secondary | ICD-10-CM | POA: Diagnosis not present

## 2023-08-24 DIAGNOSIS — G2589 Other specified extrapyramidal and movement disorders: Secondary | ICD-10-CM | POA: Diagnosis not present

## 2023-08-24 DIAGNOSIS — G243 Spasmodic torticollis: Secondary | ICD-10-CM | POA: Diagnosis not present

## 2023-08-30 ENCOUNTER — Encounter: Payer: Self-pay | Admitting: Family Medicine

## 2023-08-30 ENCOUNTER — Ambulatory Visit: Payer: Medicare Other | Admitting: Family Medicine

## 2023-08-30 DIAGNOSIS — H35371 Puckering of macula, right eye: Secondary | ICD-10-CM | POA: Diagnosis not present

## 2023-08-30 DIAGNOSIS — H35351 Cystoid macular degeneration, right eye: Secondary | ICD-10-CM | POA: Diagnosis not present

## 2023-09-01 ENCOUNTER — Other Ambulatory Visit: Payer: Self-pay | Admitting: Family

## 2023-09-01 ENCOUNTER — Encounter: Payer: Self-pay | Admitting: Family Medicine

## 2023-09-01 MED ORDER — DIAZEPAM 5 MG PO TABS: ORAL_TABLET | ORAL | 1 refills | Status: DC

## 2023-09-01 NOTE — Telephone Encounter (Signed)
Requesting: diazepam 5mg   Contract:02/28/23 UDS: 02/28/23 Last Visit: 05/30/23 Next Visit: 11/14/23  Last Refill: 05/11/23 #60 and 1RF   Please Advise

## 2023-09-06 DIAGNOSIS — Z111 Encounter for screening for respiratory tuberculosis: Secondary | ICD-10-CM | POA: Diagnosis not present

## 2023-09-06 DIAGNOSIS — R5383 Other fatigue: Secondary | ICD-10-CM | POA: Diagnosis not present

## 2023-09-06 DIAGNOSIS — L4050 Arthropathic psoriasis, unspecified: Secondary | ICD-10-CM | POA: Diagnosis not present

## 2023-09-13 IMAGING — DX DG SHOULDER 2+V*L*
3 series · 3 of 3 positions shown · non-contrast
Comparison: None.

CLINICAL DATA: Left shoulder pain with decreased range of motion.

EXAM:
LEFT SHOULDER - 2+ VIEW

[shoulder grashey]
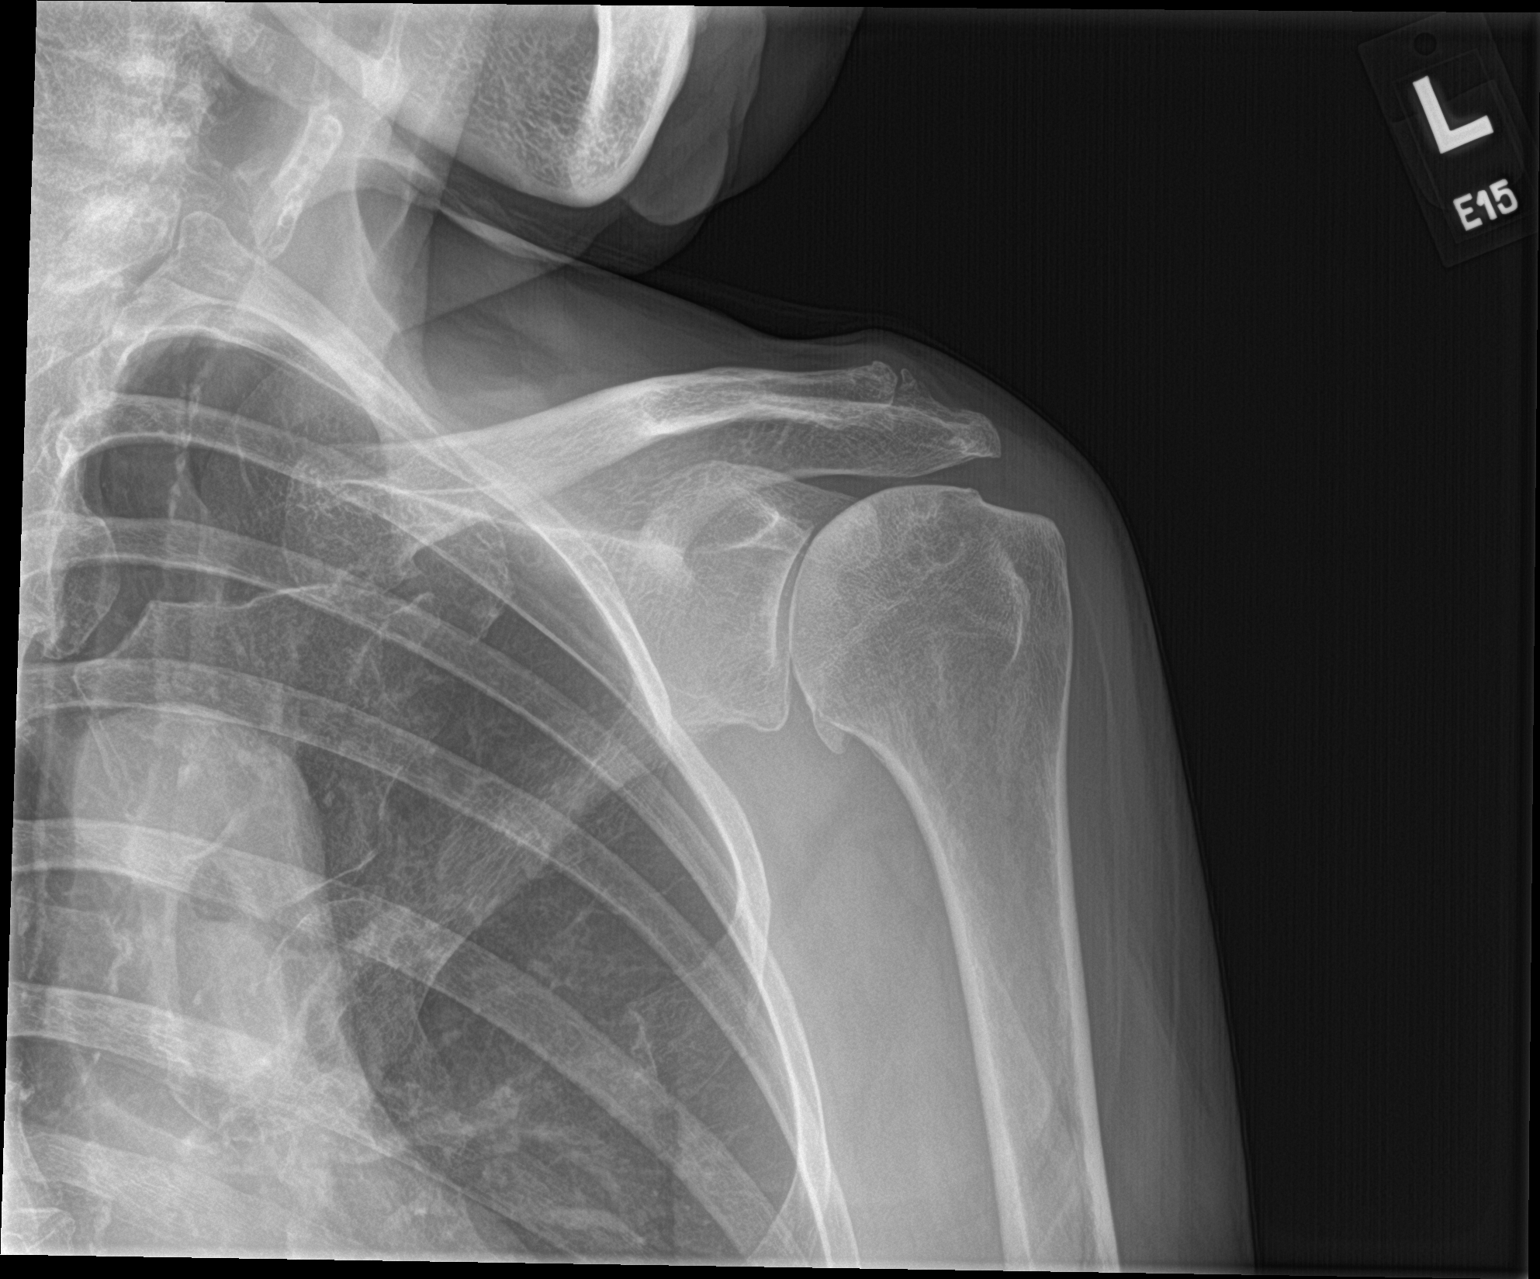

[shoulder y view]
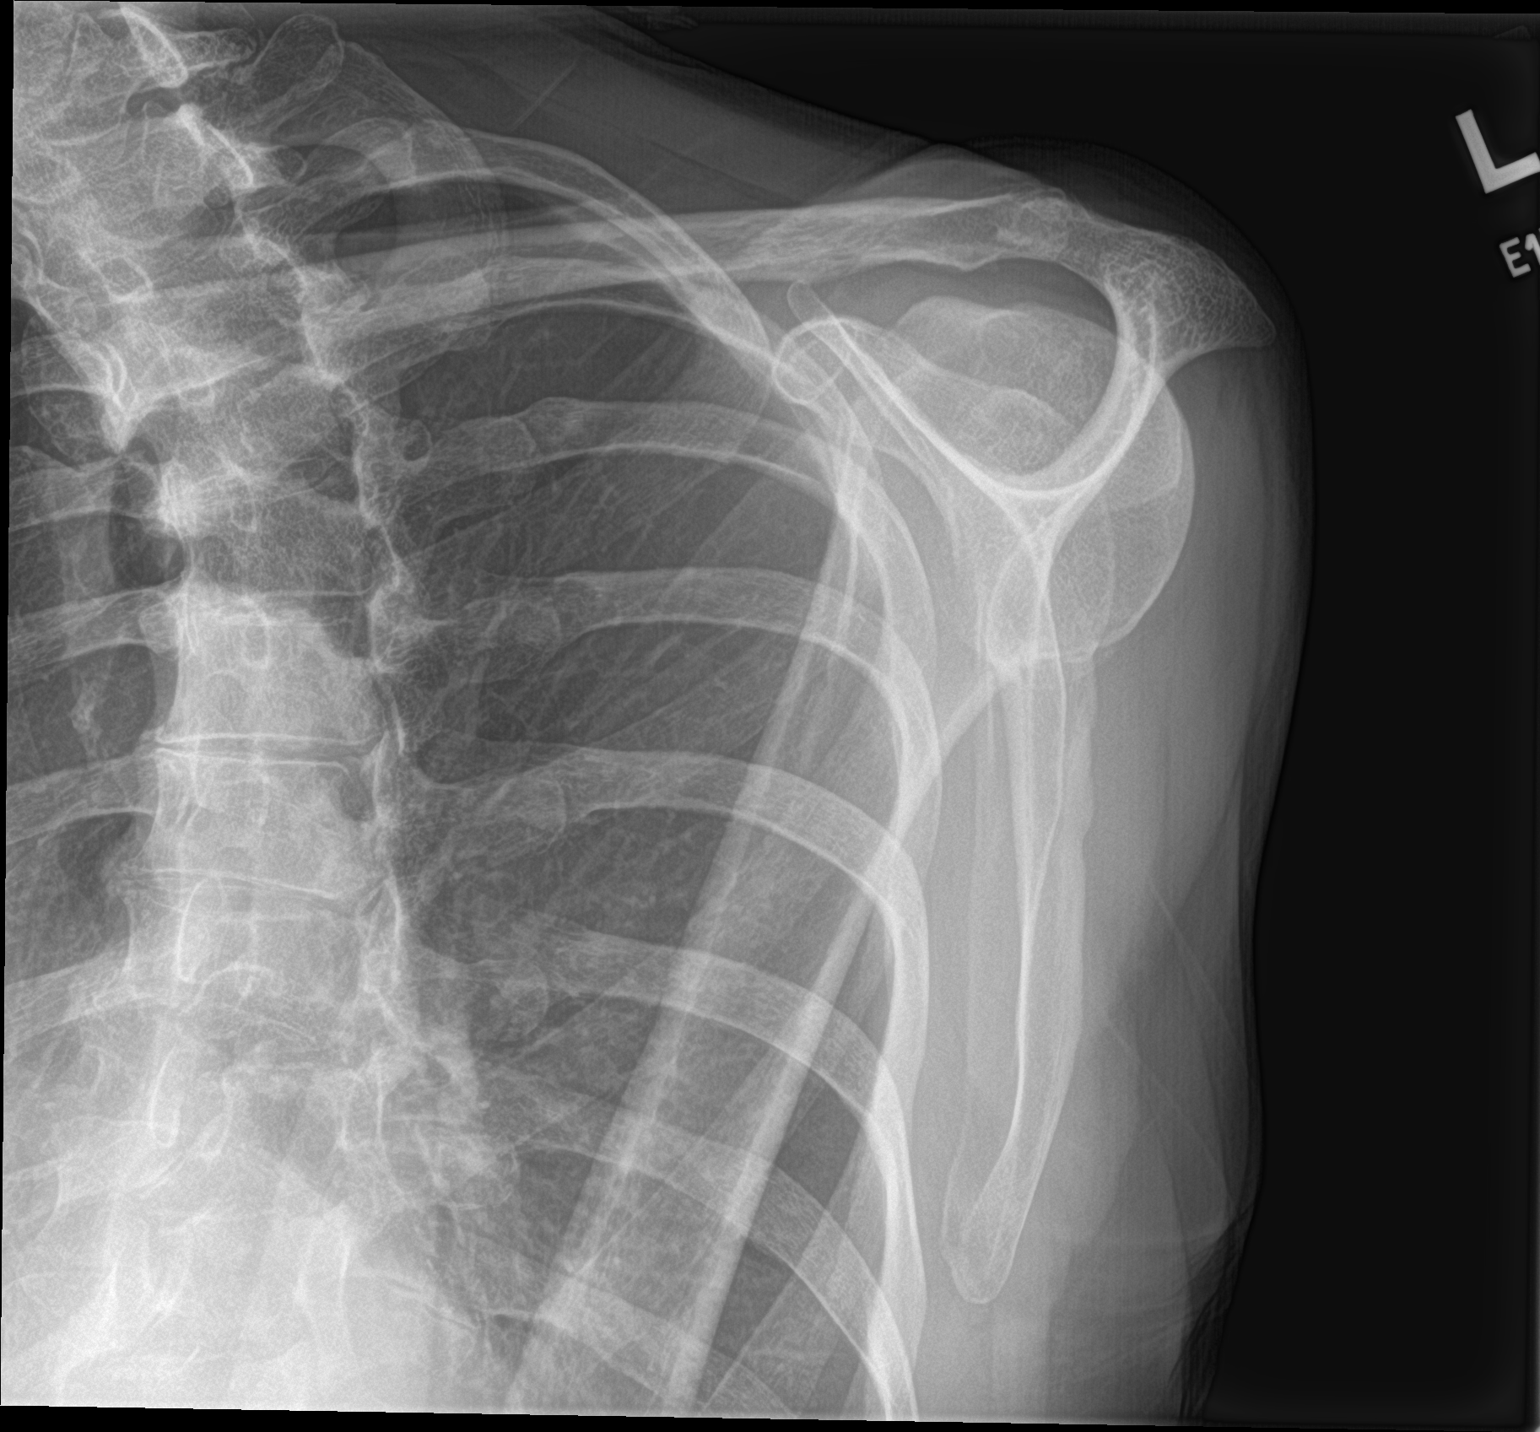

[shoulder axillary]
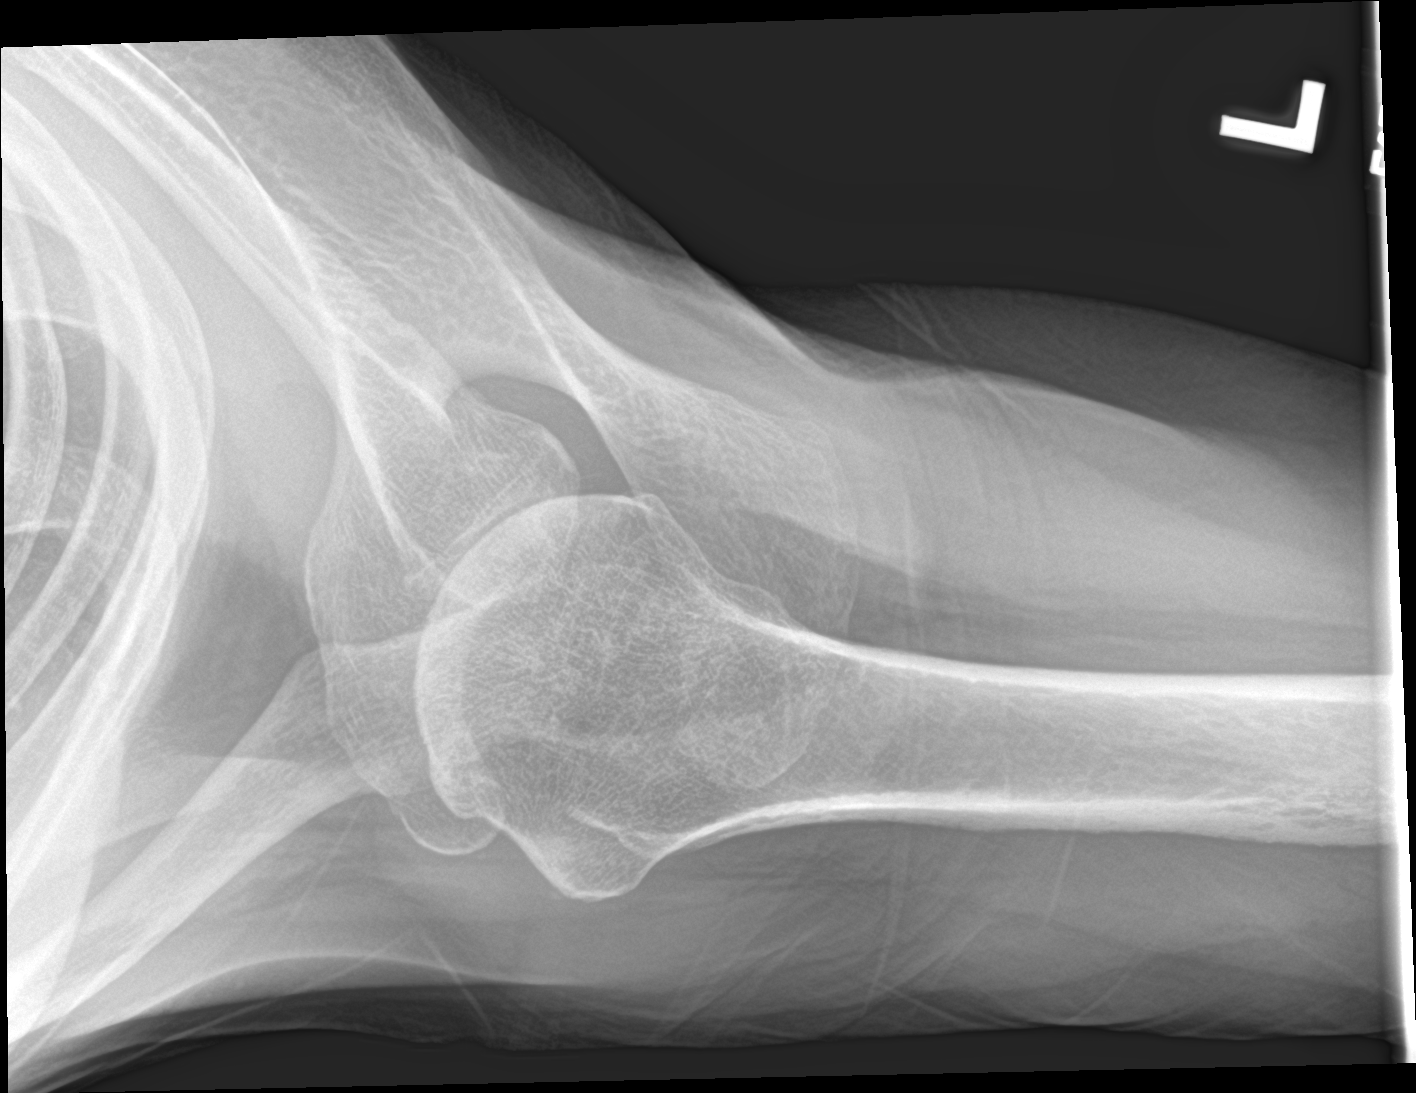

[3 of 3 positions shown; findings below may reference images not displayed]

FINDINGS: There is no evidence of fracture or dislocation. There is
degenerative narrowing of the acromioclavicular joint and
glenohumeral joint with osteophyte formation compatible. Soft
tissues are unremarkable.
IMPRESSION: 1. No acute bony abnormality.
2. Mild/moderate degenerative changes.

## 2023-09-21 DIAGNOSIS — Z961 Presence of intraocular lens: Secondary | ICD-10-CM | POA: Diagnosis not present

## 2023-09-21 DIAGNOSIS — H35371 Puckering of macula, right eye: Secondary | ICD-10-CM | POA: Diagnosis not present

## 2023-09-27 DIAGNOSIS — H35371 Puckering of macula, right eye: Secondary | ICD-10-CM | POA: Diagnosis not present

## 2023-09-27 DIAGNOSIS — H35351 Cystoid macular degeneration, right eye: Secondary | ICD-10-CM | POA: Diagnosis not present

## 2023-09-28 DIAGNOSIS — G20A1 Parkinson's disease without dyskinesia, without mention of fluctuations: Secondary | ICD-10-CM | POA: Diagnosis not present

## 2023-10-12 NOTE — Therapy (Signed)
OUTPATIENT PHYSICAL THERAPY PARKINSON'S EVALUATION   Patient Name: Stephanie [name] MRN: [id number] DOB:09-21-1932, 89 y.o., female Today's Date: 11-16-2023   END OF SESSION:  PT End of Session - 11/16/23 1315     Visit Number 1    Date for PT Re-Evaluation 12/13/23    Authorization Type Medicare & BCBS    PT Start Time 1315    PT Stop Time 1402    PT Time Calculation (min) 47 min    Activity Tolerance Patient tolerated treatment well    Behavior During Therapy WFL for tasks assessed/performed             Past Medical History:  Diagnosis Date   Allergy    Anemia    past hx    Arthropathy of cervical spine 11/27/2012   Right  Xray at [facility]  On 04/08/2016  AP, lateral, and lateral flexion and extension views of the cervical spine are submitted for evaluation.   No acute fracture identified in the cervical spine.   There is redemonstration of approximately 2 mm of C3 on C4 anterolisthesis in neutral which slightly increases in flexion relative to neutral and extension. Slight C5 on C6 retrolisthesis does not change i   Benign hypertension without congestive heart failure    Benign mole    right hcets mole, bleeds when dried with towel   Bilateral dry eyes    Bladder cancer (HCC) 11/15/16   CAD (coronary artery disease), native coronary artery 04/2023   Coronary CTA showed a coronary calcium score to 46 with less than 25% left main, 25 to 49% proximal LAD, less than 25% left circumflex stenosis   Cataract    Cervical spondylosis    Cervicalgia    2 screws in place    Chronic kidney disease    Depression with anxiety 11-15-16   prior to husbands death    Dyspnea    Gross hematuria    developed after first hip replacment, led to indicental finding of bladder tumor, bleeding now resolved    H/O total shoulder replacement, left    History of kidney stones    1970's   Hyperlipidemia    Left hand weakness    due to reinjury of flxor tendon s/p tendon surgery march 12-2018    Lumbar stenosis    Malignant tumor of urinary bladder (HCC) 07/2016   Pre-stage I   OA (osteoarthritis)    Parkinson disease [facility]) dx 2012   neurologist-  dr siddiqui at [facility]   Psoriatic arthritis [facility])     Dr Nickola Major , [place] [facility]    Rupture of flexor tendon of hand 2020   right   Past Surgical History:  Procedure Laterality Date   BIOPSY MASS LEFT FIRST METACARPAL   06/23/2006   benign   CATARACT EXTRACTION W/ INTRAOCULAR LENS  IMPLANT, BILATERAL  1999 and 2003   CERVICAL FUSION  02/2017   c1-c2 ; reports she has 2 screws 2in long in place done at [facility] ; patient exhibits VERY LIMITED NECK ROM   COLONOSCOPY     COSMETIC SURGERY     CYSTOSCOPY W/ RETROGRADES Bilateral 09/22/2016   Procedure: CYSTOSCOPY WITH RETROGRADE PYELOGRAM;  Surgeon: Sebastian Ache, MD;  Location: [facility];  Service: Urology;  Laterality: Bilateral;   D & C HYSTEROSCOPY W/ RESECTION POLYP  07/11/2000   DILATION AND CURETTAGE OF UTERUS  1960's   EXCISION CYST AND DEBRIDEMENT RIGHT WIRST AND REMOVAL FORGEIGN BODY  01/09/2009  EYE SURGERY     FLEXOR TENDON REPAIR Left 12/12/2018   Procedure: REPAIR/TRANSFER FLEXOR DIGITORUM PROFUNDUS OF LEFT SMALL FINGER;  Surgeon: Cindee Salt, MD;  Location: [facility];  Service: Orthopedics;  Laterality: Left;   JOINT REPLACEMENT     LAPAROSCOPY  yrs ago   infertility    METACARPOPHALANGEAL JOINT ARTHRODESIS Right 05/25/1996   REVERSE SHOULDER ARTHROPLASTY Left 10/14/2022   Procedure: REVERSE SHOULDER ARTHROPLASTY;  Surgeon: Francena Hanly, MD;  Location: [facility];  Service: Orthopedics;  Laterality: Left;    SPINE SURGERY     TENDON REPAIR Left 01/15/2019   Hand   TONSILLECTOMY AND ADENOIDECTOMY  child   TOTAL HIP ARTHROPLASTY Left 07/07/2016   Procedure: LEFT TOTAL HIP ARTHROPLASTY ANTERIOR APPROACH;  Surgeon: Ollen Gross, MD;  Location: [facility];  Service: Orthopedics;  Laterality: Left;   TOTAL HIP ARTHROPLASTY  Right 09/28/2017   Procedure: RIGHT TOTAL HIP ARTHROPLASTY ANTERIOR APPROACH;  Surgeon: Ollen Gross, MD;  Location: [facility];  Service: Orthopedics;  Laterality: Right;   TOTAL KNEE ARTHROPLASTY Right 07/30/2019   Procedure: TOTAL KNEE ARTHROPLASTY;  Surgeon: Ollen Gross, MD;  Location: [facility];  Service: Orthopedics;  Laterality: Right;    TRANSURETHRAL RESECTION OF BLADDER TUMOR N/A 09/22/2016   Procedure: TRANSURETHRAL RESECTION OF BLADDER TUMOR (TURBT);  Surgeon: Sebastian Ache, MD;  Location: [facility];  Service: Urology;  Laterality: N/A;   Patient Active Problem List   Diagnosis Date Noted   CAD (coronary artery disease), native coronary artery 04/20/2023   S/P reverse total shoulder arthroplasty, left 10/14/2022   Preoperative cardiovascular examination 10/04/2022   SOB (shortness of breath) 08/02/2022   Insomnia 08/02/2022   Hyponatremia 08/01/2022   Shoulder pain, left 09/14/2021   History of COVID-19 01/06/2021   Diarrhea 09/03/2020   Vitamin D deficiency 09/03/2020   OA (osteoarthritis) of knee 07/30/2019   Tinea corporis 06/08/2019   Psoriatic arthritis (HCC) 04/02/2019   Anemia 03/13/2018   Hot flashes 03/13/2018   S/P cervical spinal fusion 03/31/2017   Neck pain 12/12/2016   Depression with anxiety 10/17/2016   Bladder cancer (HCC) 10/17/2016   OA (osteoarthritis) of hip 07/07/2016   Spinal stenosis of lumbar region with radiculopathy 04/08/2016   Spondylolisthesis of lumbar region 04/08/2016   Spondylosis of cervical region without myelopathy or radiculopathy 04/08/2016   Preventative health care 01/18/2016   Low back pain 01/18/2016   History of chicken pox    Hyperlipidemia 12/15/2014   Allergic rhinitis 06/25/2014   Allergy to bee sting 06/10/2014   TMJ tenderness 11/16/2013   Other malaise and fatigue 05/07/2013   Arthropathy of cervical spine (HCC) 11/27/2012   Muscle spasms of neck 11/27/2012   Parkinson's disease (HCC)  05/24/2012   Conjunctivochalasis 03/09/2012   Epiretinal membrane 03/09/2012   Hyperopia with astigmatism and presbyopia 03/09/2012   Pseudophakia 03/09/2012   HTN (hypertension) 08/31/2011   Right knee pain 08/31/2011   Benign hypertensive heart disease without heart failure 05/12/2011    PCP: Bradd Canary, MD   REFERRING PROVIDER: Jaquita Folds, MD   REFERRING DIAG: G20.A1 Parkinson's disease w/o dyskinesia or fluctuating manifestations  THERAPY DIAG:  Other abnormalities of gait and mobility  Unsteadiness on feet  Abnormal posture  Cervicalgia  Pain in thoracic spine  RATIONALE FOR EVALUATION AND TREATMENT: Rehabilitation  ONSET DATE: Diagnosed with PD in ~2012  NEXT MD VISIT: 11/23/2023   SUBJECTIVE:  SUBJECTIVE STATEMENT: Pt reports her neurologist has changed her PD meds from Sinemet to Rytary but they are still working on the right dosage as the current dosage leads to increased dyskinesia.  She notes more stiffness from the PD.  She notes much of her difficulty also seems to stem from her chronic neck and back pain.  She got some relief from Botox injections at 5 different places in her neck in Oct 2024 but she feels like the effects are starting to fade.  She notes more difficulty with walking related to the pain in her neck and pain - feels like she cannot stand up straight.  Pain keeps her from going to church or walking for exercise.  PAIN: Are you having pain? Yes: NPRS scale: sometimes 0/10 at rest in recliner, usually 4-6/10 Pain location: L neck/thoracic spine & upper shoulder into L UE and periscapular area Pain description: constant sharp ache Aggravating factors: sitting up straight, standing and walking Relieving factors: sitting in recliner, heat &  vibration, ice, Meloxicam, Tylenol  PERTINENT HISTORY:  Chronic neck pain, L shoulder advanced OA s/p reverse TSA on 10/14/22; R TKA 07/2019, B THR 2018 & 2017, posterior C1-2 cervical fusion 2018, rupture of flexor tendon of L hand with failed surgical repair 2020, Parkinson's, psoriatic arthritis, bladder cancer, HTN    PRECAUTIONS: Fall  RED FLAGS: None  WEIGHT BEARING RESTRICTIONS: No  FALLS:  Has patient fallen in last 6 months? No  LIVING ENVIRONMENT: Lives with: lives alone Lives in: House/apartment Stairs: Yes: External: 4 steps; on right going up, on left going up, and can reach both Has following equipment at home: Single point cane, Walker - 2 wheeled, Tour manager, bed side commode, and Grab bars  OCCUPATION: Retired  [employer]: Independent and Leisure: used to Agricultural consultant at [employer], bible study, and walk 1/3 mile daily but recently unable    PATIENT GOALS: "Be able to walk (for exercise) again and go to church."   OBJECTIVE: (objective measures completed at initial evaluation unless otherwise dated)  DIAGNOSTIC FINDINGS:  N/A  COGNITION: Overall cognitive status: Within functional limits for tasks assessed and feels like her memory is not as good as it once was   SENSATION: WFL  COORDINATION: Heel/toe WNL. Heel to shin limited more but muscle tightness/stiffness from prior joint replacements.  MUSCLE LENGTH:  TBA as indicated Hamstrings:  ITB:  Piriformis:  Hip flexors:  Quads:  Heelcord:   POSTURE:  rounded shoulders, forward head, weight shift left, and head rests in 25 flexion & 8 R lateral flexion  CERVICAL ROM:  Head rests in 25 flexion & 8 R lateral flexion (10/18/23)  Active ROM Eval  Flexion 58  Extension 10  Right lateral flexion 20  Left lateral flexion 5  Right rotation 40  Left rotation 22   (Blank rows = not tested)  UPPER EXTREMITY ROM:  Active ROM Right eval Left eval  Shoulder flexion 140 119  Shoulder extension    Shoulder  abduction 127 105  Shoulder adduction    Shoulder internal rotation    Shoulder external rotation    Elbow flexion    Elbow extension    Wrist flexion    Wrist extension    Wrist ulnar deviation    Wrist radial deviation    Wrist pronation    Wrist supination     (Blank rows = not tested)  UPPER EXTREMITY MMT:  MMT Right eval Left eval  Shoulder flexion 5 5  Shoulder extension 4  4+  Shoulder abduction 5 4+  Shoulder adduction    Shoulder internal rotation 4+ 4  Shoulder external rotation 4 4  Middle trapezius    Lower trapezius     (Blank rows = not tested)   LOWER EXTREMITY ROM:    Limited B hip IR/ER but otherwise WFL  LOWER EXTREMITY MMT:  Tested in sitting on eval  MMT Right eval Left eval  Hip flexion 4 4  Hip extension 4 4  Hip abduction 4+ 4+  Hip adduction 4+ 4+  Hip internal rotation 4+ 4+  Hip external rotation 4 4  Knee flexion 5 5  Knee extension 5 5  Ankle dorsiflexion 4+ 4+  Ankle plantarflexion    Ankle inversion    Ankle eversion    (Blank rows = not tested)  BED MOBILITY:  NT  TRANSFERS: Assistive device utilized: None  Sit to stand: Complete Independence Stand to sit: Complete Independence Chair to chair: Complete Independence Floor:  NT  GAIT: Distance walked: Clinic distances Assistive device utilized: None Level of assistance: Complete Independence Gait pattern: step through pattern, decreased arm swing- Right, decreased arm swing- Left, lateral lean- Right, and decreased trunk rotation Comment: Gait speed = 2.97 ft/sec  RAMP: Level of Assistance:  NT Assistive device utilized: None Ramp Comments:   CURB:  Level of Assistance:  NT Assistive device utilized: None Curb Comments:   STAIRS:  Level of Assistance:     Psychologist, counselling Technique: Alternating Pattern  with Single Rail on Right  Number of Stairs:    Height of Stairs:   Comments:   FUNCTIONAL TESTS:  5 times sit to stand: 11.97 sec Timed up and go (TUG):  Normal - 10.85 sec, Manual - 11.22 sec, Cognitive - 12.47 sec 10 meter walk test: 11.03 sec; Gait speed = 2.97 ft/sec  Berg Balance Scale:   Functional gait assessment:    Baselines as of D/C from last PT episode for Parkinson's (09/2022): 5 times sit to stand: 12.31 sec Berg Balance Scale: 54/56 TUG, & FGA not assessed due to patient unable to return for final visit  Baselines as of D/C from PT (02/2022):  Timed up and go (TUG): 8.88 sec (Normal) 10 meter walk test: 8.82 sec; Gait speed: 3.72 ft/sec  Functional gait assessment: 26/30  PATIENT SURVEYS:  ABC scale 1030 / 1600 = 64.4 %   TODAY'S TREATMENT:   10/18/2023 - Eval only   PATIENT EDUCATION:  Education details: PT eval findings and anticipated POC  Person educated: Patient Education method: Explanation Education comprehension: verbalized understanding  HOME EXERCISE PROGRAM: TBD   ASSESSMENT:  CLINICAL IMPRESSION: Stephanie [name] is a 89 y.o. female who was referred to physical therapy for evaluation and treatment for balance disorder and gait abnormality as a result of Parkinson's disease.  Initial diagnosis of PD in ~2012.  She reports her neurologist has recently changed her PD meds from Sinemet to Rytary but she feels like the current dose of the Rytary is too much as it leads to increased dyskinesia.  She also reports increased stiffness from her PD, but states she feels much of her difficulty with movement and walking also stems from her chronic neck and back pain. Current deficits include L-sided neck, upper back and L shoulder pain, abnormal posture and gait, mild proximal UE/LE weakness, decreased activity tolerance and increased instability creating an increased fall risk.  Balance confidence per ABC scale significantly reduced from most recent PD PT episode, with current balance  confidence at 64.4% as compared to 91.9%.  5xSTS and all versions of TUG are below the fall risk thresholds, however gait speed  has significantly reduced from 3.72 ft/sec with prior testing in recent PT episodes to 2.97 ft/sec.  Will plan for further balance assessment with Berg and FGA next visit.  Increased pain and impaired mobility/activity tolerance prevent her from walking for exercise as well as attending church services, participating in Bible study and volunteering at [employer] like she used to.  Stephanie [name] will benefit from skilled PT to address above deficits to improve mobility and activity tolerance with decreased pain interference.   OBJECTIVE IMPAIRMENTS: Abnormal gait, cardiopulmonary status limiting activity, decreased activity tolerance, decreased balance, decreased coordination, decreased endurance, decreased knowledge of condition, decreased mobility, difficulty walking, decreased ROM, decreased strength, hypomobility, increased fascial restrictions, impaired perceived functional ability, increased muscle spasms, impaired flexibility, improper body mechanics, postural dysfunction, and pain.   ACTIVITY LIMITATIONS: carrying, lifting, bending, sitting, standing, squatting, sleeping, stairs, reach over head, and locomotion level  PARTICIPATION LIMITATIONS: meal prep, cleaning, laundry, driving, shopping, community activity, and church  PERSONAL FACTORS: Age, Past/current experiences, Time since onset of injury/illness/exacerbation, and 3+ comorbidities: Chronic neck pain, L shoulder advanced OA s/p reverse TSA on 10/14/22; R TKA 07/2019, B THR 2018 & 2017, posterior C1-2 cervical fusion 2018, rupture of flexor tendon of L hand with failed surgical repair 2020, Parkinson's, psoriatic arthritis, bladder cancer, HTN  are also affecting patient's functional outcome.   REHAB POTENTIAL: Good  CLINICAL DECISION MAKING: Evolving/moderate complexity  EVALUATION COMPLEXITY: Moderate   GOALS: Goals reviewed with patient? Yes  SHORT TERM GOALS: Target date: 11/15/2023  Complete assessment of Berg and FGA and update  goals and POC accordingly. Baseline:  Goal status: INITIAL  2.  Patient will be independent with initial HEP. Baseline:  Goal status: INITIAL  LONG TERM GOALS: Target date: 12/13/2023  Patient will be independent with ongoing/advanced HEP for self-management at home incorporating PWR! Moves as indicated .  Baseline:  Goal status: INITIAL  2.  Patient will be able to ambulate 600' w/o AD with good safety to access community.  Baseline:  Goal status: INITIAL  3.  Patient will be able to step up/down curb safely with LRAD for safety with community ambulation.  Baseline:  Goal status: INITIAL   4.  Patient will report 25-50% reduction in L-sided neck/thoracic, scapular and shoulder pain to improve activity tolerance for mobility and ambulation.  Baseline: Goal status: INITIAL  5.  Patient will demonstrate gait speed of >/= 3.5 ft/sec to be a safe community ambulator with decreased risk for recurrent falls.  Baseline: 2.97 ft/sec Goal status: INITIAL  6.  Patient will demonstrate at least 24/30 on FGA to improve gait stability and reduce risk for falls. (MCID = 4 points) Baseline: TBA Goal status: INITIAL  7.  Patient will improve Berg score to >/= 52/56 to improve safety and stability with ADLs in standing and reduce risk for falls. (MCID = 8 points)   Baseline: TBA Goal status: INITIAL  8.  Patient will report >/= 80% on ABC scale to demonstrate improved balance confidence and decreased risk for falls. Baseline: 64.4% Goal status: INITIAL  9. Patient will verbalize understanding of local Parkinson's disease community resources, including community fitness post d/c. Baseline:  Goal status: INITIAL   PLAN:  PT FREQUENCY: 2x/week  PT DURATION: 8 weeks  PLANNED INTERVENTIONS: 97164- PT Re-evaluation, 97110-Therapeutic exercises, 97530- Therapeutic activity, [id number]- Neuromuscular re-education, 97535- Self Care, 52841- Manual  therapy, [id number]- Gait training, 16109- Electrical  stimulation (unattended), [id number]- Electrical stimulation (manual), [id number]- Ultrasound, [id number]- Ionotophoresis 4mg /ml Dexamethasone, Patient/Family education, Balance training, Stair training, Taping, Dry Needling, Joint mobilization, Spinal mobilization, Cryotherapy, and Moist heat  PLAN FOR NEXT SESSION: Complete Berg and FGA balance assessments; review and prioritize HEP from prior PT episodes   Marry Guan, PT 10/18/2023, 6:26 PM

## 2023-10-18 ENCOUNTER — Other Ambulatory Visit: Payer: Self-pay

## 2023-10-18 ENCOUNTER — Ambulatory Visit: Payer: Medicare Other | Attending: Neurology | Admitting: Physical Therapy

## 2023-10-18 ENCOUNTER — Encounter: Payer: Self-pay | Admitting: Physical Therapy

## 2023-10-18 DIAGNOSIS — M546 Pain in thoracic spine: Secondary | ICD-10-CM | POA: Insufficient documentation

## 2023-10-18 DIAGNOSIS — M542 Cervicalgia: Secondary | ICD-10-CM | POA: Insufficient documentation

## 2023-10-18 DIAGNOSIS — R293 Abnormal posture: Secondary | ICD-10-CM | POA: Insufficient documentation

## 2023-10-18 DIAGNOSIS — R2689 Other abnormalities of gait and mobility: Secondary | ICD-10-CM | POA: Insufficient documentation

## 2023-10-18 DIAGNOSIS — R2681 Unsteadiness on feet: Secondary | ICD-10-CM | POA: Diagnosis not present

## 2023-11-01 ENCOUNTER — Encounter: Payer: Medicare Other | Admitting: Physical Therapy

## 2023-11-01 DIAGNOSIS — L4050 Arthropathic psoriasis, unspecified: Secondary | ICD-10-CM | POA: Diagnosis not present

## 2023-11-03 ENCOUNTER — Ambulatory Visit: Payer: Medicare Other | Admitting: Physical Therapy

## 2023-11-03 ENCOUNTER — Encounter: Payer: Self-pay | Admitting: Physical Therapy

## 2023-11-03 DIAGNOSIS — M546 Pain in thoracic spine: Secondary | ICD-10-CM

## 2023-11-03 DIAGNOSIS — M542 Cervicalgia: Secondary | ICD-10-CM | POA: Diagnosis not present

## 2023-11-03 DIAGNOSIS — R2681 Unsteadiness on feet: Secondary | ICD-10-CM | POA: Diagnosis not present

## 2023-11-03 DIAGNOSIS — R293 Abnormal posture: Secondary | ICD-10-CM

## 2023-11-03 DIAGNOSIS — R2689 Other abnormalities of gait and mobility: Secondary | ICD-10-CM | POA: Diagnosis not present

## 2023-11-03 NOTE — Therapy (Signed)
OUTPATIENT PHYSICAL THERAPY TREATMENT   Patient Name: Stephanie [name] MRN: [id number] DOB:08-06-1932, 89 y.o., female Today's Date: 11/03/2023   END OF SESSION:  PT End of Session - 11/03/23 1359     Visit Number 2    Date for PT Re-Evaluation 12/13/23    Authorization Type Medicare ([place]) & BCBS    PT Start Time 1359    PT Stop Time 1446    PT Time Calculation (min) 47 min    Activity Tolerance Patient tolerated treatment well    Behavior During Therapy WFL for tasks assessed/performed             Past Medical History:  Diagnosis Date   Allergy    Anemia    past hx    Arthropathy of cervical spine 11/27/2012   Right  Xray at [facility]  On 04/08/2016  AP, lateral, and lateral flexion and extension views of the cervical spine are submitted for evaluation.   No acute fracture identified in the cervical spine.   There is redemonstration of approximately 2 mm of C3 on C4 anterolisthesis in neutral which slightly increases in flexion relative to neutral and extension. Slight C5 on C6 retrolisthesis does not change i   Benign hypertension without congestive heart failure    Benign mole    right hcets mole, bleeds when dried with towel   Bilateral dry eyes    Bladder cancer (HCC) October 30, 2016   CAD (coronary artery disease), native coronary artery 04/2023   Coronary CTA showed a coronary calcium score to 46 with less than 25% left main, 25 to 49% proximal LAD, less than 25% left circumflex stenosis   Cataract    Cervical spondylosis    Cervicalgia    2 screws in place    Chronic kidney disease    Depression with anxiety 10-30-16   prior to husbands death    Dyspnea    Gross hematuria    developed after first hip replacment, led to indicental finding of bladder tumor, bleeding now resolved    H/O total shoulder replacement, left    History of kidney stones    1970's   Hyperlipidemia    Left hand weakness    due to reinjury of flxor tendon s/p tendon surgery march 12-2018    Lumbar stenosis    Malignant tumor of urinary bladder (HCC) 07/2016   Pre-stage I   OA (osteoarthritis)    Parkinson disease [facility]) dx 2012   neurologist-  dr siddiqui at [facility]   Psoriatic arthritis [facility])     Dr Nickola Major , [place] [facility]    Rupture of flexor tendon of hand 2020   right   Past Surgical History:  Procedure Laterality Date   BIOPSY MASS LEFT FIRST METACARPAL   06/23/2006   benign   CATARACT EXTRACTION W/ INTRAOCULAR LENS  IMPLANT, BILATERAL  1999 and 2003   CERVICAL FUSION  02/2017   c1-c2 ; reports she has 2 screws 2in long in place done at [facility] ; patient exhibits VERY LIMITED NECK ROM   COLONOSCOPY     COSMETIC SURGERY     CYSTOSCOPY W/ RETROGRADES Bilateral 09/22/2016   Procedure: CYSTOSCOPY WITH RETROGRADE PYELOGRAM;  Surgeon: Sebastian Ache, MD;  Location: [facility];  Service: Urology;  Laterality: Bilateral;   D & C HYSTEROSCOPY W/ RESECTION POLYP  07/11/2000   DILATION AND CURETTAGE OF UTERUS  1960's   EXCISION CYST AND DEBRIDEMENT RIGHT WIRST AND REMOVAL FORGEIGN BODY  01/09/2009  EYE SURGERY     FLEXOR TENDON REPAIR Left 12/12/2018   Procedure: REPAIR/TRANSFER FLEXOR DIGITORUM PROFUNDUS OF LEFT SMALL FINGER;  Surgeon: Cindee Salt, MD;  Location: [facility];  Service: Orthopedics;  Laterality: Left;   JOINT REPLACEMENT     LAPAROSCOPY  yrs ago   infertility    METACARPOPHALANGEAL JOINT ARTHRODESIS Right 05/25/1996   REVERSE SHOULDER ARTHROPLASTY Left 10/14/2022   Procedure: REVERSE SHOULDER ARTHROPLASTY;  Surgeon: Francena Hanly, MD;  Location: [facility];  Service: Orthopedics;  Laterality: Left;    SPINE SURGERY     TENDON REPAIR Left 01/15/2019   Hand   TONSILLECTOMY AND ADENOIDECTOMY  child   TOTAL HIP ARTHROPLASTY Left 07/07/2016   Procedure: LEFT TOTAL HIP ARTHROPLASTY ANTERIOR APPROACH;  Surgeon: Ollen Gross, MD;  Location: [facility];  Service: Orthopedics;  Laterality: Left;   TOTAL HIP ARTHROPLASTY  Right 09/28/2017   Procedure: RIGHT TOTAL HIP ARTHROPLASTY ANTERIOR APPROACH;  Surgeon: Ollen Gross, MD;  Location: [facility];  Service: Orthopedics;  Laterality: Right;   TOTAL KNEE ARTHROPLASTY Right 07/30/2019   Procedure: TOTAL KNEE ARTHROPLASTY;  Surgeon: Ollen Gross, MD;  Location: [facility];  Service: Orthopedics;  Laterality: Right;    TRANSURETHRAL RESECTION OF BLADDER TUMOR N/A 09/22/2016   Procedure: TRANSURETHRAL RESECTION OF BLADDER TUMOR (TURBT);  Surgeon: Sebastian Ache, MD;  Location: [facility];  Service: Urology;  Laterality: N/A;   Patient Active Problem List   Diagnosis Date Noted   CAD (coronary artery disease), native coronary artery 04/20/2023   S/P reverse total shoulder arthroplasty, left 10/14/2022   Preoperative cardiovascular examination 10/04/2022   SOB (shortness of breath) 08/02/2022   Insomnia 08/02/2022   Hyponatremia 08/01/2022   Shoulder pain, left 09/14/2021   History of COVID-19 01/06/2021   Diarrhea 09/03/2020   Vitamin D deficiency 09/03/2020   OA (osteoarthritis) of knee 07/30/2019   Tinea corporis 06/08/2019   Psoriatic arthritis (HCC) 04/02/2019   Anemia 03/13/2018   Hot flashes 03/13/2018   S/P cervical spinal fusion 03/31/2017   Neck pain 12/12/2016   Depression with anxiety 10/17/2016   Bladder cancer (HCC) 10/17/2016   OA (osteoarthritis) of hip 07/07/2016   Spinal stenosis of lumbar region with radiculopathy 04/08/2016   Spondylolisthesis of lumbar region 04/08/2016   Spondylosis of cervical region without myelopathy or radiculopathy 04/08/2016   Preventative health care 01/18/2016   Low back pain 01/18/2016   History of chicken pox    Hyperlipidemia 12/15/2014   Allergic rhinitis 06/25/2014   Allergy to bee sting 06/10/2014   TMJ tenderness 11/16/2013   Other malaise and fatigue 05/07/2013   Arthropathy of cervical spine (HCC) 11/27/2012   Muscle spasms of neck 11/27/2012   Parkinson's disease (HCC)  05/24/2012   Conjunctivochalasis 03/09/2012   Epiretinal membrane 03/09/2012   Hyperopia with astigmatism and presbyopia 03/09/2012   Pseudophakia 03/09/2012   HTN (hypertension) 08/31/2011   Right knee pain 08/31/2011   Benign hypertensive heart disease without heart failure 05/12/2011    PCP: Bradd Canary, MD   REFERRING PROVIDER: Jaquita Folds, MD   REFERRING DIAG: G20.A1 Parkinson's disease w/o dyskinesia or fluctuating manifestations  THERAPY DIAG:  Other abnormalities of gait and mobility  Unsteadiness on feet  Abnormal posture  Cervicalgia  Pain in thoracic spine  RATIONALE FOR EVALUATION AND TREATMENT: Rehabilitation  ONSET DATE: Diagnosed with PD in ~2012  NEXT MD VISIT: 11/23/2023   SUBJECTIVE:  SUBJECTIVE STATEMENT: Pt reports she is still working in the right dosage for the Rytary but has noted she does not wake up with the tremor any more.  Her biggest issue is still the neck pain - she had some relief from the TP injection but cannot have another one until 11/23/23.  EVAL: Pt reports her neurologist has changed her PD meds from Sinemet to Rytary but they are still working on the right dosage as the current dosage leads to increased dyskinesia.  She notes more stiffness from the PD.  She notes much of her difficulty also seems to stem from her chronic neck and back pain.  She got some relief from Botox injections at 5 different places in her neck in Oct 2024 but she feels like the effects are starting to fade.  She notes more difficulty with walking related to the pain in her neck and pain - feels like she cannot stand up straight.  Pain keeps her from going to church or walking for exercise.  PAIN: Are you having pain? Yes: NPRS scale:  5/10 Pain location: L  neck/thoracic spine & upper shoulder into L UE and periscapular area Pain description: constant sharp ache Aggravating factors: sitting up straight, standing and walking Relieving factors: sitting in recliner, heat & vibration, ice, Meloxicam, Tylenol  PERTINENT HISTORY:  Chronic neck pain, L shoulder advanced OA s/p reverse TSA on 10/14/22; R TKA 07/2019, B THR 2018 & 2017, posterior C1-2 cervical fusion 2018, rupture of flexor tendon of L hand with failed surgical repair 2020, Parkinson's, psoriatic arthritis, bladder cancer, HTN    PRECAUTIONS: Fall  RED FLAGS: None  WEIGHT BEARING RESTRICTIONS: No  FALLS:  Has patient fallen in last 6 months? No  LIVING ENVIRONMENT: Lives with: lives alone Lives in: House/apartment Stairs: Yes: External: 4 steps; on right going up, on left going up, and can reach both Has following equipment at home: Single point cane, Walker - 2 wheeled, Tour manager, bed side commode, and Grab bars  OCCUPATION: Retired  [employer]: Independent and Leisure: used to Agricultural consultant at [employer], bible study, and walk 1/3 mile daily but recently unable    PATIENT GOALS: "Be able to walk (for exercise) again and go to church."   OBJECTIVE: (objective measures completed at initial evaluation unless otherwise dated)  DIAGNOSTIC FINDINGS:  N/A  COGNITION: Overall cognitive status: Within functional limits for tasks assessed and feels like her memory is not as good as it once was   SENSATION: WFL  COORDINATION: Heel/toe WNL. Heel to shin limited more but muscle tightness/stiffness from prior joint replacements.  MUSCLE LENGTH:  TBA as indicated Hamstrings:  ITB:  Piriformis:  Hip flexors:  Quads:  Heelcord:   POSTURE:  rounded shoulders, forward head, weight shift left, and head rests in 25 flexion & 8 R lateral flexion  CERVICAL ROM:  Head rests in 25 flexion & 8 R lateral flexion (10/18/23)  Active ROM Eval  Flexion 58  Extension 10  Right lateral  flexion 20  Left lateral flexion 5  Right rotation 40  Left rotation 22   (Blank rows = not tested)  UPPER EXTREMITY ROM:  Active ROM Right eval Left eval  Shoulder flexion 140 119  Shoulder extension    Shoulder abduction 127 105  Shoulder adduction    Shoulder internal rotation    Shoulder external rotation    Elbow flexion    Elbow extension    Wrist flexion    Wrist extension  Wrist ulnar deviation    Wrist radial deviation    Wrist pronation    Wrist supination     (Blank rows = not tested)  UPPER EXTREMITY MMT:  MMT Right eval Left eval  Shoulder flexion 5 5  Shoulder extension 4 4+  Shoulder abduction 5 4+  Shoulder adduction    Shoulder internal rotation 4+ 4  Shoulder external rotation 4 4  Middle trapezius    Lower trapezius     (Blank rows = not tested)   LOWER EXTREMITY ROM:    Limited B hip IR/ER but otherwise WFL  LOWER EXTREMITY MMT:  Tested in sitting on eval  MMT Right eval Left eval  Hip flexion 4 4  Hip extension 4 4  Hip abduction 4+ 4+  Hip adduction 4+ 4+  Hip internal rotation 4+ 4+  Hip external rotation 4 4  Knee flexion 5 5  Knee extension 5 5  Ankle dorsiflexion 4+ 4+  Ankle plantarflexion    Ankle inversion    Ankle eversion    (Blank rows = not tested)  BED MOBILITY:  NT  TRANSFERS: Assistive device utilized: None  Sit to stand: Complete [place] Stand to sit: Complete [place] Chair to chair: Complete [place] Floor:  NT  GAIT: Distance walked: Clinic distances Assistive device utilized: None Level of assistance: Complete [place] Gait pattern: step through pattern, decreased arm swing- Right, decreased arm swing- Left, lateral lean- Right, and decreased trunk rotation Comment: Gait speed = 2.97 ft/sec  RAMP: Level of Assistance:  NT Assistive device utilized: None Ramp Comments:   CURB:  Level of Assistance:  NT Assistive device utilized: None Curb Comments:   STAIRS:  Level of  Assistance:     Psychologist, counselling Technique: Alternating Pattern  with Single Rail on Right  Number of Stairs:    Height of Stairs:   Comments:   FUNCTIONAL TESTS:  5 times sit to stand: 11.97 sec Timed up and go (TUG): Normal - 10.85 sec, Manual - 11.22 sec, Cognitive - 12.47 sec 10 meter walk test: 11.03 sec; Gait speed = 2.97 ft/sec  Berg Balance Scale: 46/56, 46-51 moderate fall risk (>50%)  (11/03/23) Functional gait assessment: 15/30, < 19 = high risk fall (11/03/23)  Baselines as of D/C from last PT episode for Parkinson's (09/2022): 5 times sit to stand: 12.31 sec Berg Balance Scale: 54/56 TUG, & FGA not assessed due to patient unable to return for final visit  Baselines as of D/C from PT (02/2022):  Timed up and go (TUG): 8.88 sec (Normal) 10 meter walk test: 8.82 sec; Gait speed: 3.72 ft/sec  Functional gait assessment: 26/30  PATIENT SURVEYS:  ABC scale 1030 / 1600 = 64.4 %   TODAY'S TREATMENT:   11/03/23  THERAPEUTIC EXERCISE: to improve flexibility, strength and mobility.  Demonstration, verbal and tactile cues throughout for technique.  NuStep - L4 x 6 min (L UE & B LE to promote reciprocal motion) Review of HEP handouts from prior PT episodes identifying most relevant exercises for current issues  THERAPEUTIC ACTIVITIES: Berg = 46/56, 46-51 moderate risk for falls (>50%)  FGA = 15/30, < 19 = high risk fall     10/18/2023 - Eval only   PATIENT EDUCATION:  Education details: PT eval findings, anticipated POC, and HEP review  Person educated: Patient Education method: Explanation Education comprehension: verbalized understanding and needs further education  HOME EXERCISE PROGRAM: Access Code: [email or web address] URL: [email or web address] Date: 11/03/2023 Prepared by: Glenetta Hew  Exercises -  Seated Hamstring Stretch  - 2 x daily - 7 x weekly - 3 reps - 30 sec hold - Seated Hip Abduction with Resistance  - 1 x daily - 3-4 x weekly - 2 sets -  10 reps - 3-5 sec hold - Seated March with Resistance  - 1 x daily - 3-4 x weekly - 2 sets - 10 reps - 3 sec hold - Sit to Stand with Resistance Around Legs  - 1 x daily - 3-4 x weekly - 2 sets - 10 reps   ASSESSMENT:  CLINICAL IMPRESSION: Completed standardized balance testing with Sharlene Motts demonstrating a decline from 54/56 (as of D/C from PT in 09/2022) to 46/56 indicating a moderate fall risk, as well as FGA demonstrating a decline from 26/30 (as of 02/2022) to 15/30 indicating a high fall risk.  Patient denies any falls but does note feeling less steady than she has in the past.  She reported that her neurologist at one point in time wanted her to use a quad cane, however she felt like this made her more unstable.  We discussed options for potential assistive devices, explaining the differences between a quad cane versus a SPC for stability as well as a 4WW/rollator.  At this point Natasja prefers not to try to use an assistive device if she is able to improve with PT, however she is open to an AD if improvement not noted or balance seems to worsen.  Patient fatigued from testing and limited time remaining in session, therefore reviewed prior HEP handouts that pt brought with her to PT today helping her to identify the most relevant exercises for her current issues.  Will plan to physically perform exercises next visit and make necessary adjustments as well as add further exercises to address current deficits.  Rennee will benefit from continued skilled PT to address above deficits to improve mobility and activity tolerance with decreased pain interference.   OBJECTIVE IMPAIRMENTS: Abnormal gait, cardiopulmonary status limiting activity, decreased activity tolerance, decreased balance, decreased coordination, decreased endurance, decreased knowledge of condition, decreased mobility, difficulty walking, decreased ROM, decreased strength, hypomobility, increased fascial restrictions, impaired perceived  functional ability, increased muscle spasms, impaired flexibility, improper body mechanics, postural dysfunction, and pain.   ACTIVITY LIMITATIONS: carrying, lifting, bending, sitting, standing, squatting, sleeping, stairs, reach over head, and locomotion level  PARTICIPATION LIMITATIONS: meal prep, cleaning, laundry, driving, shopping, community activity, and church  PERSONAL FACTORS: Age, Past/current experiences, Time since onset of injury/illness/exacerbation, and 3+ comorbidities: Chronic neck pain, L shoulder advanced OA s/p reverse TSA on 10/14/22; R TKA 07/2019, B THR 2018 & 2017, posterior C1-2 cervical fusion 2018, rupture of flexor tendon of L hand with failed surgical repair 2020, Parkinson's, psoriatic arthritis, bladder cancer, HTN  are also affecting patient's functional outcome.   REHAB POTENTIAL: Good  CLINICAL DECISION MAKING: Evolving/moderate complexity  EVALUATION COMPLEXITY: Moderate   GOALS: Goals reviewed with patient? Yes  SHORT TERM GOALS: Target date: 11/15/2023  Complete assessment of Berg and FGA and update goals and POC accordingly. Baseline:  Goal status: MET  2.  Patient will be independent with initial HEP. Baseline:  Goal status: IN PROGRESS  LONG TERM GOALS: Target date: 12/13/2023  Patient will be independent with ongoing/advanced HEP for self-management at home incorporating PWR! Moves as indicated .  Baseline:  Goal status: INITIAL  2.  Patient will be able to ambulate 600' w/o AD with good safety to access community.  Baseline:  Goal status: INITIAL  3.  Patient  will be able to step up/down curb safely with LRAD for safety with community ambulation.  Baseline:  Goal status: INITIAL   4.  Patient will report 25-50% reduction in L-sided neck/thoracic, scapular and shoulder pain to improve activity tolerance for mobility and ambulation.  Baseline: Goal status: INITIAL  5.  Patient will demonstrate gait speed of >/= 3.5 ft/sec to be a  safe community ambulator with decreased risk for recurrent falls.  Baseline: 2.97 ft/sec Goal status: INITIAL  6.  Patient will demonstrate at least 24/30 on FGA to improve gait stability and reduce risk for falls. (MCID = 4 points) Baseline: 15/30 (11/03/23) Goal status: INITIAL  7.  Patient will improve Berg score to >/= 52/56 to improve safety and stability with ADLs in standing and reduce risk for falls. (MCID = 8 points)   Baseline: 46/56 (11/03/23) Goal status: INITIAL  8.  Patient will report >/= 80% on ABC scale to demonstrate improved balance confidence and decreased risk for falls. Baseline: 64.4% Goal status: INITIAL  9. Patient will verbalize understanding of local Parkinson's disease community resources, including community fitness post d/c. Baseline:  Goal status: INITIAL   PLAN:  PT FREQUENCY: 2x/week  PT DURATION: 8 weeks  PLANNED INTERVENTIONS: 97164- PT Re-evaluation, 97110-Therapeutic exercises, 97530- Therapeutic activity, [id number]- Neuromuscular re-education, 97535- Self Care, 16109- Manual therapy, [id number]- Gait training, 97014- Electrical stimulation (unattended), [phone]- Electrical stimulation (manual), [id number]- Ultrasound, 09811- Ionotophoresis 4mg /ml Dexamethasone, Patient/Family education, Balance training, Stair training, Taping, Dry Needling, Joint mobilization, Spinal mobilization, Cryotherapy, and Moist heat  PLAN FOR NEXT SESSION: Further review and prioritization of HEP from prior PT episodes with additions PRN   Marry Guan, PT 11/03/2023, 4:32 PM

## 2023-11-07 ENCOUNTER — Ambulatory Visit: Payer: Medicare Other | Admitting: Physical Therapy

## 2023-11-07 ENCOUNTER — Encounter: Payer: Self-pay | Admitting: Physical Therapy

## 2023-11-07 DIAGNOSIS — R2681 Unsteadiness on feet: Secondary | ICD-10-CM | POA: Diagnosis not present

## 2023-11-07 DIAGNOSIS — M546 Pain in thoracic spine: Secondary | ICD-10-CM

## 2023-11-07 DIAGNOSIS — R293 Abnormal posture: Secondary | ICD-10-CM

## 2023-11-07 DIAGNOSIS — M542 Cervicalgia: Secondary | ICD-10-CM

## 2023-11-07 DIAGNOSIS — R2689 Other abnormalities of gait and mobility: Secondary | ICD-10-CM

## 2023-11-07 NOTE — Therapy (Signed)
OUTPATIENT PHYSICAL THERAPY TREATMENT   Patient Name: Stephanie [name] MRN: [id number] DOB:1932/02/01, 89 y.o., female Today's Date: 11/07/2023   END OF SESSION:  PT End of Session - 11/07/23 1148     Visit Number 3    Date for PT Re-Evaluation 12/13/23    Authorization Type Medicare ([place]) & BCBS    PT Start Time 1148    PT Stop Time 1234    PT Time Calculation (min) 46 min    Activity Tolerance Patient tolerated treatment well    Behavior During Therapy WFL for tasks assessed/performed              Past Medical History:  Diagnosis Date   Allergy    Anemia    past hx    Arthropathy of cervical spine 11/27/2012   Right  Xray at [facility]  On 04/08/2016  AP, lateral, and lateral flexion and extension views of the cervical spine are submitted for evaluation.   No acute fracture identified in the cervical spine.   There is redemonstration of approximately 2 mm of C3 on C4 anterolisthesis in neutral which slightly increases in flexion relative to neutral and extension. Slight C5 on C6 retrolisthesis does not change i   Benign hypertension without congestive heart failure    Benign mole    right hcets mole, bleeds when dried with towel   Bilateral dry eyes    Bladder cancer (HCC) 2016-10-26   CAD (coronary artery disease), native coronary artery 04/2023   Coronary CTA showed a coronary calcium score to 46 with less than 25% left main, 25 to 49% proximal LAD, less than 25% left circumflex stenosis   Cataract    Cervical spondylosis    Cervicalgia    2 screws in place    Chronic kidney disease    Depression with anxiety October 26, 2016   prior to husbands death    Dyspnea    Gross hematuria    developed after first hip replacment, led to indicental finding of bladder tumor, bleeding now resolved    H/O total shoulder replacement, left    History of kidney stones    1970's   Hyperlipidemia    Left hand weakness    due to reinjury of flxor tendon s/p tendon surgery march 12-2018    Lumbar stenosis    Malignant tumor of urinary bladder (HCC) 07/2016   Pre-stage I   OA (osteoarthritis)    Parkinson disease [facility]) dx 2012   neurologist-  dr siddiqui at [facility]   Psoriatic arthritis [facility])     Dr Nickola Major , [place] [facility]    Rupture of flexor tendon of hand 2020   right   Past Surgical History:  Procedure Laterality Date   BIOPSY MASS LEFT FIRST METACARPAL   06/23/2006   benign   CATARACT EXTRACTION W/ INTRAOCULAR LENS  IMPLANT, BILATERAL  1999 and 2003   CERVICAL FUSION  02/2017   c1-c2 ; reports she has 2 screws 2in long in place done at [facility] ; patient exhibits VERY LIMITED NECK ROM   COLONOSCOPY     COSMETIC SURGERY     CYSTOSCOPY W/ RETROGRADES Bilateral 09/22/2016   Procedure: CYSTOSCOPY WITH RETROGRADE PYELOGRAM;  Surgeon: Sebastian Ache, MD;  Location: [facility];  Service: Urology;  Laterality: Bilateral;   D & C HYSTEROSCOPY W/ RESECTION POLYP  07/11/2000   DILATION AND CURETTAGE OF UTERUS  1960's   EXCISION CYST AND DEBRIDEMENT RIGHT WIRST AND REMOVAL FORGEIGN BODY  01/09/2009  EYE SURGERY     FLEXOR TENDON REPAIR Left 12/12/2018   Procedure: REPAIR/TRANSFER FLEXOR DIGITORUM PROFUNDUS OF LEFT SMALL FINGER;  Surgeon: Cindee Salt, MD;  Location: [facility];  Service: Orthopedics;  Laterality: Left;   JOINT REPLACEMENT     LAPAROSCOPY  yrs ago   infertility    METACARPOPHALANGEAL JOINT ARTHRODESIS Right 05/25/1996   REVERSE SHOULDER ARTHROPLASTY Left 10/14/2022   Procedure: REVERSE SHOULDER ARTHROPLASTY;  Surgeon: Francena Hanly, MD;  Location: [facility];  Service: Orthopedics;  Laterality: Left;    SPINE SURGERY     TENDON REPAIR Left 01/15/2019   Hand   TONSILLECTOMY AND ADENOIDECTOMY  child   TOTAL HIP ARTHROPLASTY Left 07/07/2016   Procedure: LEFT TOTAL HIP ARTHROPLASTY ANTERIOR APPROACH;  Surgeon: Ollen Gross, MD;  Location: [facility];  Service: Orthopedics;  Laterality: Left;   TOTAL HIP ARTHROPLASTY  Right 09/28/2017   Procedure: RIGHT TOTAL HIP ARTHROPLASTY ANTERIOR APPROACH;  Surgeon: Ollen Gross, MD;  Location: [facility];  Service: Orthopedics;  Laterality: Right;   TOTAL KNEE ARTHROPLASTY Right 07/30/2019   Procedure: TOTAL KNEE ARTHROPLASTY;  Surgeon: Ollen Gross, MD;  Location: [facility];  Service: Orthopedics;  Laterality: Right;    TRANSURETHRAL RESECTION OF BLADDER TUMOR N/A 09/22/2016   Procedure: TRANSURETHRAL RESECTION OF BLADDER TUMOR (TURBT);  Surgeon: Sebastian Ache, MD;  Location: [facility];  Service: Urology;  Laterality: N/A;   Patient Active Problem List   Diagnosis Date Noted   CAD (coronary artery disease), native coronary artery 04/20/2023   S/P reverse total shoulder arthroplasty, left 10/14/2022   Preoperative cardiovascular examination 10/04/2022   SOB (shortness of breath) 08/02/2022   Insomnia 08/02/2022   Hyponatremia 08/01/2022   Shoulder pain, left 09/14/2021   History of COVID-19 01/06/2021   Diarrhea 09/03/2020   Vitamin D deficiency 09/03/2020   OA (osteoarthritis) of knee 07/30/2019   Tinea corporis 06/08/2019   Psoriatic arthritis (HCC) 04/02/2019   Anemia 03/13/2018   Hot flashes 03/13/2018   S/P cervical spinal fusion 03/31/2017   Neck pain 12/12/2016   Depression with anxiety 10/17/2016   Bladder cancer (HCC) 10/17/2016   OA (osteoarthritis) of hip 07/07/2016   Spinal stenosis of lumbar region with radiculopathy 04/08/2016   Spondylolisthesis of lumbar region 04/08/2016   Spondylosis of cervical region without myelopathy or radiculopathy 04/08/2016   Preventative health care 01/18/2016   Low back pain 01/18/2016   History of chicken pox    Hyperlipidemia 12/15/2014   Allergic rhinitis 06/25/2014   Allergy to bee sting 06/10/2014   TMJ tenderness 11/16/2013   Other malaise and fatigue 05/07/2013   Arthropathy of cervical spine (HCC) 11/27/2012   Muscle spasms of neck 11/27/2012   Parkinson's disease (HCC)  05/24/2012   Conjunctivochalasis 03/09/2012   Epiretinal membrane 03/09/2012   Hyperopia with astigmatism and presbyopia 03/09/2012   Pseudophakia 03/09/2012   HTN (hypertension) 08/31/2011   Right knee pain 08/31/2011   Benign hypertensive heart disease without heart failure 05/12/2011    PCP: Bradd Canary, MD   REFERRING PROVIDER: Jaquita Folds, MD   REFERRING DIAG: G20.A1 Parkinson's disease w/o dyskinesia or fluctuating manifestations  THERAPY DIAG:  Other abnormalities of gait and mobility  Unsteadiness on feet  Abnormal posture  Cervicalgia  Pain in thoracic spine  RATIONALE FOR EVALUATION AND TREATMENT: Rehabilitation  ONSET DATE: Diagnosed with PD in ~2012  NEXT MD VISIT: 11/23/2023   SUBJECTIVE:  SUBJECTIVE STATEMENT: Pt reports her neck pain remains pretty constant.  EVAL: Pt reports her neurologist has changed her PD meds from Sinemet to Rytary but they are still working on the right dosage as the current dosage leads to increased dyskinesia.  She notes more stiffness from the PD.  She notes much of her difficulty also seems to stem from her chronic neck and back pain.  She got some relief from Botox injections at 5 different places in her neck in Oct 2024 but she feels like the effects are starting to fade.  She notes more difficulty with walking related to the pain in her neck and pain - feels like she cannot stand up straight.  Pain keeps her from going to church or walking for exercise.  PAIN: Are you having pain? Yes: NPRS scale:  5/10 Pain location: L neck/thoracic spine & upper shoulder into L UE and periscapular area Pain description: constant sharp ache Aggravating factors: sitting up straight, standing and walking Relieving factors: sitting in  recliner, heat & vibration, ice, Meloxicam, Tylenol  PERTINENT HISTORY:  Chronic neck pain, L shoulder advanced OA s/p reverse TSA on 10/14/22; R TKA 07/2019, B THR 2018 & 2017, posterior C1-2 cervical fusion 2018, rupture of flexor tendon of L hand with failed surgical repair 2020, Parkinson's, psoriatic arthritis, bladder cancer, HTN    PRECAUTIONS: Fall  RED FLAGS: None  WEIGHT BEARING RESTRICTIONS: No  FALLS:  Has patient fallen in last 6 months? No  LIVING ENVIRONMENT: Lives with: lives alone Lives in: House/apartment Stairs: Yes: External: 4 steps; on right going up, on left going up, and can reach both Has following equipment at home: Single point cane, Walker - 2 wheeled, Tour manager, bed side commode, and Grab bars  OCCUPATION: Retired  [employer]: Independent and Leisure: used to Agricultural consultant at [employer], bible study, and walk 1/3 mile daily but recently unable    PATIENT GOALS: "Be able to walk (for exercise) again and go to church."   OBJECTIVE: (objective measures completed at initial evaluation unless otherwise dated)  DIAGNOSTIC FINDINGS:  N/A  COGNITION: Overall cognitive status: Within functional limits for tasks assessed and feels like her memory is not as good as it once was   SENSATION: WFL  COORDINATION: Heel/toe WNL. Heel to shin limited more but muscle tightness/stiffness from prior joint replacements.  MUSCLE LENGTH:  TBA as indicated Hamstrings:  ITB:  Piriformis:  Hip flexors:  Quads:  Heelcord:   POSTURE:  rounded shoulders, forward head, weight shift left, and head rests in 25 flexion & 8 R lateral flexion  CERVICAL ROM:  Head rests in 25 flexion & 8 R lateral flexion (10/18/23)  Active ROM Eval  Flexion 58  Extension 10  Right lateral flexion 20  Left lateral flexion 5  Right rotation 40  Left rotation 22   (Blank rows = not tested)  UPPER EXTREMITY ROM:  Active ROM Right eval Left eval  Shoulder flexion 140 119  Shoulder  extension    Shoulder abduction 127 105  Shoulder adduction    Shoulder internal rotation    Shoulder external rotation    Elbow flexion    Elbow extension    Wrist flexion    Wrist extension    Wrist ulnar deviation    Wrist radial deviation    Wrist pronation    Wrist supination     (Blank rows = not tested)  UPPER EXTREMITY MMT:  MMT Right eval Left eval  Shoulder flexion 5 5  Shoulder extension 4 4+  Shoulder abduction 5 4+  Shoulder adduction    Shoulder internal rotation 4+ 4  Shoulder external rotation 4 4  Middle trapezius    Lower trapezius     (Blank rows = not tested)   LOWER EXTREMITY ROM:    Limited B hip IR/ER but otherwise WFL  LOWER EXTREMITY MMT:  Tested in sitting on eval  MMT Right eval Left eval  Hip flexion 4 4  Hip extension 4 4  Hip abduction 4+ 4+  Hip adduction 4+ 4+  Hip internal rotation 4+ 4+  Hip external rotation 4 4  Knee flexion 5 5  Knee extension 5 5  Ankle dorsiflexion 4+ 4+  Ankle plantarflexion    Ankle inversion    Ankle eversion    (Blank rows = not tested)  BED MOBILITY:  NT  TRANSFERS: Assistive device utilized: None  Sit to stand: Complete Independence Stand to sit: Complete Independence Chair to chair: Complete Independence Floor:  NT  GAIT: Distance walked: Clinic distances Assistive device utilized: None Level of assistance: Complete Independence Gait pattern: step through pattern, decreased arm swing- Right, decreased arm swing- Left, lateral lean- Right, and decreased trunk rotation Comment: Gait speed = 2.97 ft/sec  RAMP: Level of Assistance:  NT Assistive device utilized: None Ramp Comments:   CURB:  Level of Assistance:  NT Assistive device utilized: None Curb Comments:   STAIRS:  Level of Assistance:     Psychologist, counselling Technique: Alternating Pattern  with Single Rail on Right  Number of Stairs:    Height of Stairs:   Comments:   FUNCTIONAL TESTS:  5 times sit to stand: 11.97  sec Timed up and go (TUG): Normal - 10.85 sec, Manual - 11.22 sec, Cognitive - 12.47 sec 10 meter walk test: 11.03 sec; Gait speed = 2.97 ft/sec  Berg Balance Scale: 46/56, 46-51 moderate fall risk (>50%)  (11/03/23) Functional gait assessment: 15/30, < 19 = high risk fall (11/03/23)  Baselines as of D/C from last PT episode for Parkinson's (09/2022): 5 times sit to stand: 12.31 sec Berg Balance Scale: 54/56 TUG, & FGA not assessed due to patient unable to return for final visit  Baselines as of D/C from PT (02/2022):  Timed up and go (TUG): 8.88 sec (Normal) 10 meter walk test: 8.82 sec; Gait speed: 3.72 ft/sec  Functional gait assessment: 26/30  PATIENT SURVEYS:  ABC scale 1030 / 1600 = 64.4 %   TODAY'S TREATMENT:   11/07/23  THERAPEUTIC EXERCISE: to improve flexibility, strength and mobility.  Demonstration, verbal and tactile cues throughout for technique.  NuStep - L5 x 6 min (L UE & B LE to promote reciprocal motion) Completed review of HEP handouts from prior PT episodes identifying most relevant exercises for current issues Seated cervical + scap retraction into towel roll along spine in back of chair 10 x 3"  MANUAL THERAPY: To promote normalized muscle tension, improved flexibility, increased ROM, and reduced pain.  STM/DTM and manual TPR to L UT and LS Gentle manual stretching for B UT & LS  NEUROMUSCULAR RE-EDUCATION: To improve posture, balance, coordination, amplitude of movement, speed of movement to reduce bradykinesia, and reduce rigidity.  Seated PWR! Moves - Up, Rock, Twist, and Step x 10   11/03/23  THERAPEUTIC EXERCISE: to improve flexibility, strength and mobility.  Demonstration, verbal and tactile cues throughout for technique.  NuStep - L4 x 6 min (L UE & B LE to promote reciprocal motion) Review of  HEP handouts from prior PT episodes identifying most relevant exercises for current issues  THERAPEUTIC ACTIVITIES: Berg = 46/56, 46-51 moderate  risk for falls (>50%)  FGA = 15/30, < 19 = high risk fall     10/18/2023 - Eval only   PATIENT EDUCATION:  Education details: PT eval findings, anticipated POC, and HEP review  Person educated: Patient Education method: Explanation Education comprehension: verbalized understanding and needs further education  HOME EXERCISE PROGRAM: Access Code: [email or web address] URL: [email or web address] Date: 11/07/2023 Prepared by: Glenetta Hew  Exercises - Seated Hamstring Stretch  - 2 x daily - 7 x weekly - 3 reps - 30 sec hold - Seated Hip Abduction with Resistance  - 1 x daily - 3-4 x weekly - 2 sets - 10 reps - 3-5 sec hold - Seated March with Resistance  - 1 x daily - 3-4 x weekly - 2 sets - 10 reps - 3 sec hold - Sit to Stand with Resistance Around Legs  - 1 x daily - 3-4 x weekly - 2 sets - 10 reps - Seated Shoulder Setting  - 1 x daily - 7 x weekly - 2 sets - 10 reps - 3 sec hold - Standing Bilateral Low Shoulder Row with Anchored Resistance  - 1 x daily - 3-4 x weekly - 2 sets - 10 reps - 5 sec hold - Scapular Retraction with Resistance Advanced  - 1 x daily - 3-4 x weekly - 2 sets - 10 reps - 5 sec hold   ASSESSMENT:  CLINICAL IMPRESSION: Mayana continues to complain of increased L neck and upper shoulder pain which interferes with daily activity and exercise tolerance.  Performed MT and attempt to try to reduce abnormal muscle tension and alleviate TPs to improve tolerance for cervical ROM and exercise performance with patient noting some benefit.  Completed review of prior HEP, adding in postural exercises and scapular strengthening with updated HEP handouts provided.  Initiated review of seated PWR! Moves, providing clarification of desired movement patterns as well as awareness of posture and head motions as patient tending to hold head in flexed position.  Unita will benefit from continued skilled PT to address ongoing deficits to improve mobility and activity tolerance with  decreased pain interference.   OBJECTIVE IMPAIRMENTS: Abnormal gait, cardiopulmonary status limiting activity, decreased activity tolerance, decreased balance, decreased coordination, decreased endurance, decreased knowledge of condition, decreased mobility, difficulty walking, decreased ROM, decreased strength, hypomobility, increased fascial restrictions, impaired perceived functional ability, increased muscle spasms, impaired flexibility, improper body mechanics, postural dysfunction, and pain.   ACTIVITY LIMITATIONS: carrying, lifting, bending, sitting, standing, squatting, sleeping, stairs, reach over head, and locomotion level  PARTICIPATION LIMITATIONS: meal prep, cleaning, laundry, driving, shopping, community activity, and church  PERSONAL FACTORS: Age, Past/current experiences, Time since onset of injury/illness/exacerbation, and 3+ comorbidities: Chronic neck pain, L shoulder advanced OA s/p reverse TSA on 10/14/22; R TKA 07/2019, B THR 2018 & 2017, posterior C1-2 cervical fusion 2018, rupture of flexor tendon of L hand with failed surgical repair 2020, Parkinson's, psoriatic arthritis, bladder cancer, HTN  are also affecting patient's functional outcome.   REHAB POTENTIAL: Good  CLINICAL DECISION MAKING: Evolving/moderate complexity  EVALUATION COMPLEXITY: Moderate   GOALS: Goals reviewed with patient? Yes  SHORT TERM GOALS: Target date: 11/15/2023  Complete assessment of Berg and FGA and update goals and POC accordingly. Baseline:  Goal status: MET  2.  Patient will be independent with initial HEP. Baseline:  Goal status: IN PROGRESS - 11/07/23 - review  completed - patient to let PT know of any concerns after attempted at home  LONG TERM GOALS: Target date: 12/13/2023  Patient will be independent with ongoing/advanced HEP for self-management at home incorporating PWR! Moves as indicated .  Baseline:  Goal status: IN PROGRESS  2.  Patient will be able to ambulate 600'  w/o AD with good safety to access community.  Baseline:  Goal status: IN PROGRESS  3.  Patient will be able to step up/down curb safely with LRAD for safety with community ambulation.  Baseline:  Goal status: IN PROGRESS   4.  Patient will report 25-50% reduction in L-sided neck/thoracic, scapular and shoulder pain to improve activity tolerance for mobility and ambulation.  Baseline: Goal status: IN PROGRESS  5.  Patient will demonstrate gait speed of >/= 3.5 ft/sec to be a safe community ambulator with decreased risk for recurrent falls.  Baseline: 2.97 ft/sec Goal status: IN PROGRESS  6.  Patient will demonstrate at least 24/30 on FGA to improve gait stability and reduce risk for falls. (MCID = 4 points) Baseline: 15/30 (11/03/23) Goal status: IN PROGRESS  7.  Patient will improve Berg score to >/= 52/56 to improve safety and stability with ADLs in standing and reduce risk for falls. (MCID = 8 points)   Baseline: 46/56 (11/03/23) Goal status: IN PROGRESS  8.  Patient will report >/= 80% on ABC scale to demonstrate improved balance confidence and decreased risk for falls. Baseline: 64.4% Goal status: IN PROGRESS  9. Patient will verbalize understanding of local Parkinson's disease community resources, including community fitness post d/c. Baseline:  Goal status: IN PROGRESS   PLAN:  PT FREQUENCY: 2x/week  PT DURATION: 8 weeks  PLANNED INTERVENTIONS: 97164- PT Re-evaluation, 97110-Therapeutic exercises, 97530- Therapeutic activity, [id number]- Neuromuscular re-education, 97535- Self Care, 16109- Manual therapy, [id number]- Gait training, 97014- Electrical stimulation (unattended), [id number]- Electrical stimulation (manual), [id number]- Ultrasound, 60454- Ionotophoresis 4mg /ml Dexamethasone, Patient/Family education, Balance training, Stair training, Taping, Dry Needling, Joint mobilization, Spinal mobilization, Cryotherapy, and Moist heat  PLAN FOR NEXT SESSION: Review and progression of PWR!  Moves; core/LE and postural strengthening; MT +/- DN to address abnormal muscle tension and TPs   Marry Guan, PT 11/07/2023, 1:15 PM

## 2023-11-14 ENCOUNTER — Ambulatory Visit: Payer: Medicare Other | Admitting: Family Medicine

## 2023-11-14 NOTE — Assessment & Plan Note (Signed)
Well controlled, no changes to meds. Encouraged heart healthy diet such as the DASH diet and exercise as tolerated.  °

## 2023-11-14 NOTE — Assessment & Plan Note (Signed)
Tolerating statin, encouraged heart healthy diet, avoid trans fats, minimize simple carbs and saturated fats. Increase exercise as tolerated 

## 2023-11-14 NOTE — Assessment & Plan Note (Signed)
Has trouble maintaining sleep due to tremor but diazepam is helpful is allowed a refill

## 2023-11-14 NOTE — Assessment & Plan Note (Signed)
Supplement and monitor 

## 2023-11-15 ENCOUNTER — Ambulatory Visit (INDEPENDENT_AMBULATORY_CARE_PROVIDER_SITE_OTHER): Payer: Medicare Other | Admitting: Family Medicine

## 2023-11-15 VITALS — BP 124/68 | HR 78 | Temp 97.7°F | Resp 18 | Ht 62.0 in | Wt 112.8 lb

## 2023-11-15 DIAGNOSIS — G4700 Insomnia, unspecified: Secondary | ICD-10-CM | POA: Diagnosis not present

## 2023-11-15 DIAGNOSIS — E559 Vitamin D deficiency, unspecified: Secondary | ICD-10-CM

## 2023-11-15 DIAGNOSIS — E7800 Pure hypercholesterolemia, unspecified: Secondary | ICD-10-CM | POA: Diagnosis not present

## 2023-11-15 DIAGNOSIS — F418 Other specified anxiety disorders: Secondary | ICD-10-CM

## 2023-11-15 DIAGNOSIS — I10 Essential (primary) hypertension: Secondary | ICD-10-CM

## 2023-11-15 DIAGNOSIS — R739 Hyperglycemia, unspecified: Secondary | ICD-10-CM | POA: Diagnosis not present

## 2023-11-15 MED ORDER — SERTRALINE HCL 25 MG PO TABS: 25.0000 mg | ORAL_TABLET | Freq: Every day | ORAL | 3 refills | Status: DC

## 2023-11-15 NOTE — Patient Instructions (Addendum)
 Cognitive Behavioral Therapy Insomnia (CBTI)   Encouraged increased hydration and fiber in diet. Daily probiotics. If bowels not moving can use Milk Of Magnesia 2 tbls po in 4 oz of warm prune juice by mouth every 2-3 days. If no results then repeat in 4 hours with  Dulcolax suppository pr, may repeat again in 4 more hours as needed. Seek care if symptoms worsen. Consider daily Miralax  and/or Dulcolax if symptoms persist.    Once you clean out then take Miralax  mixed with Benefiber 1 tsp daily  Constipation, Adult Constipation is when a person has fewer than three bowel movements in a week, has difficulty having a bowel movement, or has stools (feces) that are dry, hard, or larger than normal. Constipation may be caused by an underlying condition. It may become worse with age if a person takes certain medicines and does not take in enough fluids. Follow these instructions at home: Eating and drinking  Eat foods that have a lot of fiber, such as beans, whole grains, and fresh fruits and vegetables. Limit foods that are low in fiber and high in fat and processed sugars, such as fried or sweet foods. These include french fries, hamburgers, cookies, candies, and soda. Drink enough fluid to keep your urine pale yellow. General instructions Exercise regularly or as told by your health care provider. Try to do 150 minutes of moderate exercise each week. Use the bathroom when you have the urge to go. Do not hold it in. Take over-the-counter and prescription medicines only as told by your health care provider. This includes any fiber supplements. During bowel movements: Practice deep breathing while relaxing the lower abdomen. Practice pelvic floor relaxation. Watch your condition for any changes. Let your health care provider know about them. Keep all follow-up visits as told by your health care provider. This is important. Contact a health care provider if: You have pain that gets worse. You have  a fever. You do not have a bowel movement after 4 days. You vomit. You are not hungry or you lose weight. You are bleeding from the opening between the buttocks (anus). You have thin, pencil-like stools. Get help right away if: You have a fever and your symptoms suddenly get worse. You leak stool or have blood in your stool. Your abdomen is bloated. You have severe pain in your abdomen. You feel dizzy or you faint. Summary Constipation is when a person has fewer than three bowel movements in a week, has difficulty having a bowel movement, or has stools (feces) that are dry, hard, or larger than normal. Eat foods that have a lot of fiber, such as beans, whole grains, and fresh fruits and vegetables. Drink enough fluid to keep your urine pale yellow. Take over-the-counter and prescription medicines only as told by your health care provider. This includes any fiber supplements. This information is not intended to replace advice given to you by your health care provider. Make sure you discuss any questions you have with your health care provider. Document Revised: 09/15/2022 Document Reviewed: 09/15/2022 Elsevier Patient Education  2024 [employer].

## 2023-11-17 ENCOUNTER — Ambulatory Visit: Payer: Medicare Other | Attending: Neurology | Admitting: Physical Therapy

## 2023-11-17 ENCOUNTER — Encounter: Payer: Self-pay | Admitting: Physical Therapy

## 2023-11-17 DIAGNOSIS — R293 Abnormal posture: Secondary | ICD-10-CM | POA: Diagnosis not present

## 2023-11-17 DIAGNOSIS — M542 Cervicalgia: Secondary | ICD-10-CM | POA: Insufficient documentation

## 2023-11-17 DIAGNOSIS — R2681 Unsteadiness on feet: Secondary | ICD-10-CM | POA: Diagnosis not present

## 2023-11-17 DIAGNOSIS — M546 Pain in thoracic spine: Secondary | ICD-10-CM | POA: Diagnosis not present

## 2023-11-17 DIAGNOSIS — R2689 Other abnormalities of gait and mobility: Secondary | ICD-10-CM | POA: Diagnosis not present

## 2023-11-17 NOTE — Therapy (Signed)
 OUTPATIENT PHYSICAL THERAPY TREATMENT   Patient Name: Stephanie [name] MRN: [id number] DOB:1932/09/18, 89 y.o., female Today's Date: 11/17/2023   END OF SESSION:  PT End of Session - 11/17/23 1529     Visit Number 4    Date for PT Re-Evaluation 12/13/23    Authorization Type Medicare & BCBS    PT Start Time 1529    PT Stop Time 1613    PT Time Calculation (min) 44 min    Activity Tolerance Patient tolerated treatment well;Patient limited by pain    Behavior During Therapy [facility] for tasks assessed/performed               Past Medical History:  Diagnosis Date   Allergy    Anemia    past hx    Arthropathy of cervical spine 11/27/2012   Right  Xray at [facility]  On 04/08/2016  AP, lateral, and lateral flexion and extension views of the cervical spine are submitted for evaluation.   No acute fracture identified in the cervical spine.   There is redemonstration of approximately 2 mm of C3 on C4 anterolisthesis in neutral which slightly increases in flexion relative to neutral and extension. Slight C5 on C6 retrolisthesis does not change i   Benign hypertension without congestive heart failure    Benign mole    right hcets mole, bleeds when dried with towel   Bilateral dry eyes    Bladder cancer (HCC) 27-Oct-2016   CAD (coronary artery disease), native coronary artery 04/2023   Coronary CTA showed a coronary calcium  score to 46 with less than 25% left main, 25 to 49% proximal LAD, less than 25% left circumflex stenosis   Cataract    Cervical spondylosis    Cervicalgia    2 screws in place    Chronic kidney disease    Depression with anxiety October 27, 2016   prior to husbands death    Dyspnea    Gross hematuria    developed after first hip replacment, led to indicental finding of bladder tumor, bleeding now resolved    H/O total shoulder replacement, left    History of kidney stones    1970's   Hyperlipidemia    Left hand weakness    due to reinjury of flxor tendon s/p tendon surgery  march 12-2018   Lumbar stenosis    Malignant tumor of urinary bladder (HCC) 07/2016   Pre-stage I   OA (osteoarthritis)    Parkinson disease [facility]) dx 2012   neurologist-  dr siddiqui at [facility]   Psoriatic arthritis [facility])     Dr Ishmael , [place] [facility]    Rupture of flexor tendon of hand 2020   right   Past Surgical History:  Procedure Laterality Date   BIOPSY MASS LEFT FIRST METACARPAL   06/23/2006   benign   CATARACT EXTRACTION W/ INTRAOCULAR LENS  IMPLANT, BILATERAL  1999 and 2003   CERVICAL FUSION  02/2017   c1-c2 ; reports she has 2 screws 2in long in place done at [facility] ; patient exhibits VERY LIMITED NECK ROM   COLONOSCOPY     COSMETIC SURGERY     CYSTOSCOPY W/ RETROGRADES Bilateral 09/22/2016   Procedure: CYSTOSCOPY WITH RETROGRADE PYELOGRAM;  Surgeon: Ricardo Likens, MD;  Location: [facility];  Service: Urology;  Laterality: Bilateral;   D & C HYSTEROSCOPY W/ RESECTION POLYP  07/11/2000   DILATION AND CURETTAGE OF UTERUS  1960's   EXCISION CYST AND DEBRIDEMENT RIGHT WIRST AND REMOVAL FORGEIGN  BODY  01/09/2009   EYE SURGERY     FLEXOR TENDON REPAIR Left 12/12/2018   Procedure: REPAIR/TRANSFER FLEXOR DIGITORUM PROFUNDUS OF LEFT SMALL FINGER;  Surgeon: Murrell Kuba, MD;  Location: [facility];  Service: Orthopedics;  Laterality: Left;   JOINT REPLACEMENT     LAPAROSCOPY  yrs ago   infertility    METACARPOPHALANGEAL JOINT ARTHRODESIS Right 05/25/1996   REVERSE SHOULDER ARTHROPLASTY Left 10/14/2022   Procedure: REVERSE SHOULDER ARTHROPLASTY;  Surgeon: Melita Drivers, MD;  Location: [facility];  Service: Orthopedics;  Laterality: Left;    SPINE SURGERY     TENDON REPAIR Left 01/15/2019   Hand   TONSILLECTOMY AND ADENOIDECTOMY  child   TOTAL HIP ARTHROPLASTY Left 07/07/2016   Procedure: LEFT TOTAL HIP ARTHROPLASTY ANTERIOR APPROACH;  Surgeon: Dempsey Moan, MD;  Location: [facility];  Service: Orthopedics;  Laterality: Left;   TOTAL HIP  ARTHROPLASTY Right 09/28/2017   Procedure: RIGHT TOTAL HIP ARTHROPLASTY ANTERIOR APPROACH;  Surgeon: Moan Dempsey, MD;  Location: [facility];  Service: Orthopedics;  Laterality: Right;   TOTAL KNEE ARTHROPLASTY Right 07/30/2019   Procedure: TOTAL KNEE ARTHROPLASTY;  Surgeon: Moan Dempsey, MD;  Location: [facility];  Service: Orthopedics;  Laterality: Right;    TRANSURETHRAL RESECTION OF BLADDER TUMOR N/A 09/22/2016   Procedure: TRANSURETHRAL RESECTION OF BLADDER TUMOR (TURBT);  Surgeon: Ricardo Likens, MD;  Location: [facility];  Service: Urology;  Laterality: N/A;   Patient Active Problem List   Diagnosis Date Noted   CAD (coronary artery disease), native coronary artery 04/20/2023   S/P reverse total shoulder arthroplasty, left 10/14/2022   Preoperative cardiovascular examination 10/04/2022   SOB (shortness of breath) 08/02/2022   Insomnia 08/02/2022   Hyponatremia 08/01/2022   Shoulder pain, left 09/14/2021   History of COVID-19 01/06/2021   Diarrhea 09/03/2020   Vitamin D  deficiency 09/03/2020   OA (osteoarthritis) of knee 07/30/2019   Tinea corporis 06/08/2019   Psoriatic arthritis (HCC) 04/02/2019   Anemia 03/13/2018   Hot flashes 03/13/2018   S/P cervical spinal fusion 03/31/2017   Neck pain 12/12/2016   Depression with anxiety 10/17/2016   Bladder cancer (HCC) 10/17/2016   OA (osteoarthritis) of hip 07/07/2016   Spinal stenosis of lumbar region with radiculopathy 04/08/2016   Spondylolisthesis of lumbar region 04/08/2016   Spondylosis of cervical region without myelopathy or radiculopathy 04/08/2016   Preventative health care 01/18/2016   Low back pain 01/18/2016   History of chicken pox    Hyperlipidemia 12/15/2014   Allergic rhinitis 06/25/2014   Allergy to bee sting 06/10/2014   TMJ tenderness 11/16/2013   Other malaise and fatigue 05/07/2013   Arthropathy of cervical spine (HCC) 11/27/2012   Muscle spasms of neck 11/27/2012   Parkinson's disease  (HCC) 05/24/2012   Epiretinal membrane 03/09/2012   Hyperopia with astigmatism and presbyopia 03/09/2012   Pseudophakia 03/09/2012   HTN (hypertension) 08/31/2011   Right knee pain 08/31/2011   Benign hypertensive heart disease without heart failure 05/12/2011    PCP: Domenica Harlene LABOR, MD   REFERRING PROVIDER: Rosalia Hollering, MD   REFERRING DIAG: G20.A1 Parkinson's disease w/o dyskinesia or fluctuating manifestations  THERAPY DIAG:  Other abnormalities of gait and mobility  Unsteadiness on feet  Abnormal posture  Cervicalgia  Pain in thoracic spine  RATIONALE FOR EVALUATION AND TREATMENT: Rehabilitation  ONSET DATE: Diagnosed with PD in ~2012  NEXT MD VISIT: 11/23/2023 - for Botox injections in neck and back   SUBJECTIVE:  SUBJECTIVE STATEMENT: Pt reports she reached out to her MD about not feeling any benefit from her infusions and now will f/u with the MD on Monday 11/21/23.  She will follow-up with her referring provider for Botox injections on 11/23/2023.  EVAL: Pt reports her neurologist has changed her PD meds from Sinemet  to Rytary  but they are still working on the right dosage as the current dosage leads to increased dyskinesia.  She notes more stiffness from the PD.  She notes much of her difficulty also seems to stem from her chronic neck and back pain.  She got some relief from Botox injections at 5 different places in her neck in Oct 2024 but she feels like the effects are starting to fade.  She notes more difficulty with walking related to the pain in her neck and pain - feels like she cannot stand up straight.  Pain keeps her from going to church or walking for exercise.  PAIN: Are you having pain? Yes: NPRS scale:  5/10 Pain location: L neck/thoracic spine & upper  shoulder into L UE and periscapular area Pain description: constant sharp ache Aggravating factors: sitting up straight, standing and walking Relieving factors: sitting in recliner, heat & vibration, ice, Meloxicam , Tylenol   PERTINENT HISTORY:  Chronic neck pain, L shoulder advanced OA s/p reverse TSA on 10/14/22; R TKA 07/2019, B THR 2018 & 2017, posterior C1-2 cervical fusion 2018, rupture of flexor tendon of L hand with failed surgical repair 2020, Parkinson's, psoriatic arthritis, bladder cancer, HTN    PRECAUTIONS: Fall  RED FLAGS: None  WEIGHT BEARING RESTRICTIONS: No  FALLS:  Has patient fallen in last 6 months? No  LIVING ENVIRONMENT: Lives with: lives alone Lives in: House/apartment Stairs: Yes: External: 4 steps; on right going up, on left going up, and can reach both Has following equipment at home: Single point cane, Walker - 2 wheeled, Tour manager, bed side commode, and Grab bars  OCCUPATION: Retired  PLOF: Independent and Leisure: used to agricultural consultant at [employer], bible study, and walk 1/3 mile daily but recently unable    PATIENT GOALS: Be able to walk (for exercise) again and go to church.   OBJECTIVE: (objective measures completed at initial evaluation unless otherwise dated)  DIAGNOSTIC FINDINGS:  N/A  COGNITION: Overall cognitive status: Within functional limits for tasks assessed and feels like her memory is not as good as it once was   SENSATION: WFL  COORDINATION: Heel/toe WNL. Heel to shin limited more but muscle tightness/stiffness from prior joint replacements.  MUSCLE LENGTH:  TBA as indicated Hamstrings:  ITB:  Piriformis:  Hip flexors:  Quads:  Heelcord:   POSTURE:  rounded shoulders, forward head, weight shift left, and head rests in 25 flexion & 8 R lateral flexion  CERVICAL ROM:  Head rests in 25 flexion & 8 R lateral flexion (10/18/23)  Active ROM Eval  Flexion 58  Extension 10  Right lateral flexion 20  Left lateral  flexion 5  Right rotation 40  Left rotation 22   (Blank rows = not tested)  UPPER EXTREMITY ROM:  Active ROM Right eval Left eval  Shoulder flexion 140 119  Shoulder extension    Shoulder abduction 127 105  Shoulder adduction    Shoulder internal rotation    Shoulder external rotation    Elbow flexion    Elbow extension    Wrist flexion    Wrist extension    Wrist ulnar deviation    Wrist radial deviation  Wrist pronation    Wrist supination     (Blank rows = not tested)  UPPER EXTREMITY MMT:  MMT Right eval Left eval  Shoulder flexion 5 5  Shoulder extension 4 4+  Shoulder abduction 5 4+  Shoulder adduction    Shoulder internal rotation 4+ 4  Shoulder external rotation 4 4  Middle trapezius    Lower trapezius     (Blank rows = not tested)   LOWER EXTREMITY ROM:    Limited B hip IR/ER but otherwise WFL  LOWER EXTREMITY MMT:  Tested in sitting on eval  MMT Right eval Left eval  Hip flexion 4 4  Hip extension 4 4  Hip abduction 4+ 4+  Hip adduction 4+ 4+  Hip internal rotation 4+ 4+  Hip external rotation 4 4  Knee flexion 5 5  Knee extension 5 5  Ankle dorsiflexion 4+ 4+  Ankle plantarflexion    Ankle inversion    Ankle eversion    (Blank rows = not tested)  BED MOBILITY:  NT  TRANSFERS: Assistive device utilized: None  Sit to stand: Complete Independence Stand to sit: Complete Independence Chair to chair: Complete Independence Floor:  NT  GAIT: Distance walked: Clinic distances Assistive device utilized: None Level of assistance: Complete Independence Gait pattern: step through pattern, decreased arm swing- Right, decreased arm swing- Left, lateral lean- Right, and decreased trunk rotation Comment: Gait speed = 2.97 ft/sec  RAMP: Level of Assistance:  NT Assistive device utilized: None Ramp Comments:   CURB:  Level of Assistance:  NT Assistive device utilized: None Curb Comments:   STAIRS:  Level of Assistance:     Warden/ranger Technique: Alternating Pattern  with Single Rail on Right  Number of Stairs:    Height of Stairs:   Comments:   FUNCTIONAL TESTS:  5 times sit to stand: 11.97 sec Timed up and go (TUG): Normal - 10.85 sec, Manual - 11.22 sec, Cognitive - 12.47 sec 10 meter walk test: 11.03 sec; Gait speed = 2.97 ft/sec  Berg Balance Scale: 46/56, 46-51 moderate fall risk (>50%)  (11/03/23) Functional gait assessment: 15/30, < 19 = high risk fall (11/03/23)  Baselines as of D/C from last PT episode for Parkinson's (09/2022): 5 times sit to stand: 12.31 sec Berg Balance Scale: 54/56 TUG, & FGA not assessed due to patient unable to return for final visit  Baselines as of D/C from PT (02/2022):  Timed up and go (TUG): 8.88 sec (Normal) 10 meter walk test: 8.82 sec; Gait speed: 3.72 ft/sec  Functional gait assessment: 26/30  PATIENT SURVEYS:  ABC scale 1030 / 1600 = 64.4 %   TODAY'S TREATMENT:   11/17/23  THERAPEUTIC EXERCISE: to improve flexibility, strength and mobility.  Demonstration, verbal and tactile cues throughout for technique.  NuStep - L5 x 5 min - L UE & B LE initially to promote reciprocal motion (R UE deferred due to tendon injuries in wrist), however reduced to LE only due to c/o increased neck and upper shoulder pain  NEUROMUSCULAR RE-EDUCATION: To improve posture, balance, coordination, reduce fall risk, amplitude of movement, speed of movement to reduce bradykinesia, and reduce rigidity.  Standing PWR! Moves - Up, Rock, Twist, and Step x 10 Supine PWR! Moves - Up, Rock, and Step x 10;  Twist x 5 (stopped d/t increased neck pain)  MANUAL THERAPY: To promote normalized muscle tension, improved flexibility, improved joint mobility, increased ROM, and reduced pain.  STM/DTM and manual TPR to L UT,  LS and scalenes   11/07/23  THERAPEUTIC EXERCISE: to improve flexibility, strength and mobility.  Demonstration, verbal and tactile cues throughout for technique.  NuStep -  L5 x 6 min (L UE & B LE to promote reciprocal motion) Completed review of HEP handouts from prior PT episodes identifying most relevant exercises for current issues Seated cervical + scap retraction into towel roll along spine in back of chair 10 x 3  MANUAL THERAPY: To promote normalized muscle tension, improved flexibility, increased ROM, and reduced pain.  STM/DTM and manual TPR to L UT and LS Gentle manual stretching for B UT & LS  NEUROMUSCULAR RE-EDUCATION: To improve posture, balance, coordination, amplitude of movement, speed of movement to reduce bradykinesia, and reduce rigidity.  Seated PWR! Moves - Up, Rock, Twist, and Step x 10   11/03/23  THERAPEUTIC EXERCISE: to improve flexibility, strength and mobility.  Demonstration, verbal and tactile cues throughout for technique.  NuStep - L4 x 6 min (L UE & B LE to promote reciprocal motion) Review of HEP handouts from prior PT episodes identifying most relevant exercises for current issues  THERAPEUTIC ACTIVITIES: Berg = 46/56, 46-51 moderate risk for falls (>50%)  FGA = 15/30, < 19 = high risk fall     10/18/2023 - Eval only   PATIENT EDUCATION:  Education details: PWR! Moves - standing and supine   Person educated: Patient Education method: Explanation, Demonstration, Verbal cues, and Handouts Education comprehension: verbalized understanding, returned demonstration, verbal cues required, and needs further education  HOME EXERCISE PROGRAM: Access Code: [email or web address] URL: [email or web address] Date: 11/07/2023 Prepared by: Elijah Hidden  Exercises - Seated Hamstring Stretch  - 2 x daily - 7 x weekly - 3 reps - 30 sec hold - Seated Hip Abduction with Resistance  - 1 x daily - 3-4 x weekly - 2 sets - 10 reps - 3-5 sec hold - Seated March with Resistance  - 1 x daily - 3-4 x weekly - 2 sets - 10 reps - 3 sec hold - Sit to Stand with Resistance Around Legs  - 1 x daily - 3-4 x weekly - 2 sets - 10 reps - Seated  Shoulder Setting  - 1 x daily - 7 x weekly - 2 sets - 10 reps - 3 sec hold - Standing Bilateral Low Shoulder Row with Anchored Resistance  - 1 x daily - 3-4 x weekly - 2 sets - 10 reps - 5 sec hold - Scapular Retraction with Resistance Advanced  - 1 x daily - 3-4 x weekly - 2 sets - 10 reps - 5 sec hold  PWR! Moves: - Sitting - Standing - Supine   ASSESSMENT:  CLINICAL IMPRESSION: Diann reports good comfort with seated PWR! Moves reintroduced last visit and denies need for review today, therefore proceeded with progression to standing PWR! Moves.  She was able to provide good return demonstration of movement patterns and good tolerance for most patterns, however cues necessary for return to starting position during PWR! Twist between rotation to each side.  Introduced supine PWR! Moves today with good return demonstration, although limited tolerance for supine PWR! Twist due to triggering increased neck and upper shoulder pain on the left.  Remainder of session focusing on addressing pain and abnormal muscle tension in left UT, LS and scalene musculature.  Patient was very TTP with limited tolerance for manual TPR at times.  She is scheduled to have her next round of Botox injections on 11/23/2023 which will hopefully improve her  tolerance for exercise and manual therapy to further normalize muscle tension and improve movement/activity tolerance.  Jeanine will benefit from continued skilled PT to address ongoing deficits to improve mobility and activity tolerance with decreased pain interference.   OBJECTIVE IMPAIRMENTS: Abnormal gait, cardiopulmonary status limiting activity, decreased activity tolerance, decreased balance, decreased coordination, decreased endurance, decreased knowledge of condition, decreased mobility, difficulty walking, decreased ROM, decreased strength, hypomobility, increased fascial restrictions, impaired perceived functional ability, increased muscle spasms, impaired  flexibility, improper body mechanics, postural dysfunction, and pain.   ACTIVITY LIMITATIONS: carrying, lifting, bending, sitting, standing, squatting, sleeping, stairs, reach over head, and locomotion level  PARTICIPATION LIMITATIONS: meal prep, cleaning, laundry, driving, shopping, community activity, and church  PERSONAL FACTORS: Age, Past/current experiences, Time since onset of injury/illness/exacerbation, and 3+ comorbidities: Chronic neck pain, L shoulder advanced OA s/p reverse TSA on 10/14/22; R TKA 07/2019, B THR 2018 & 2017, posterior C1-2 cervical fusion 2018, rupture of flexor tendon of L hand with failed surgical repair 2020, Parkinson's, psoriatic arthritis, bladder cancer, HTN  are also affecting patient's functional outcome.   REHAB POTENTIAL: Good  CLINICAL DECISION MAKING: Evolving/moderate complexity  EVALUATION COMPLEXITY: Moderate   GOALS: Goals reviewed with patient? Yes  SHORT TERM GOALS: Target date: 11/15/2023  Complete assessment of Berg and FGA and update goals and POC accordingly. Baseline:  Goal status: MET - 11/03/23  2.  Patient will be independent with initial HEP. Baseline:  Goal status: IN PROGRESS - 11/07/23 - review completed - patient to let PT know of any concerns after attempted at home  LONG TERM GOALS: Target date: 12/13/2023  Patient will be independent with ongoing/advanced HEP for self-management at home incorporating PWR! Moves as indicated .  Baseline:  Goal status: IN PROGRESS  2.  Patient will be able to ambulate 600' w/o AD with good safety to access community.  Baseline:  Goal status: IN PROGRESS  3.  Patient will be able to step up/down curb safely with LRAD for safety with community ambulation.  Baseline:  Goal status: IN PROGRESS   4.  Patient will report 25-50% reduction in L-sided neck/thoracic, scapular and shoulder pain to improve activity tolerance for mobility and ambulation.  Baseline: Goal status: IN  PROGRESS  5.  Patient will demonstrate gait speed of >/= 3.5 ft/sec to be a safe community ambulator with decreased risk for recurrent falls.  Baseline: 2.97 ft/sec Goal status: IN PROGRESS  6.  Patient will demonstrate at least 24/30 on FGA to improve gait stability and reduce risk for falls. (MCID = 4 points) Baseline: 15/30 (11/03/23) Goal status: IN PROGRESS  7.  Patient will improve Berg score to >/= 52/56 to improve safety and stability with ADLs in standing and reduce risk for falls. (MCID = 8 points)   Baseline: 46/56 (11/03/23) Goal status: IN PROGRESS  8.  Patient will report >/= 80% on ABC scale to demonstrate improved balance confidence and decreased risk for falls. Baseline: 64.4% Goal status: IN PROGRESS  9. Patient will verbalize understanding of local Parkinson's disease community resources, including community fitness post d/c. Baseline:  Goal status: IN PROGRESS   PLAN:  PT FREQUENCY: 2x/week  PT DURATION: 8 weeks  PLANNED INTERVENTIONS: 97164- PT Re-evaluation, 97110-Therapeutic exercises, 97530- Therapeutic activity, 97112- Neuromuscular re-education, 97535- Self Care, [id number]- Manual therapy, [id number]- Gait training, 97014- Electrical stimulation (unattended), [id number]- Electrical stimulation (manual), [id number]- Ultrasound, 02966- Ionotophoresis 4mg /ml Dexamethasone , Patient/Family education, Balance training, Stair training, Taping, Dry Needling, Joint mobilization, Spinal mobilization, Cryotherapy, and Moist heat  PLAN FOR NEXT SESSION: Review and progression of PWR! Moves as tolerated; core/LE and postural strengthening; MT +/- DN to address abnormal muscle tension and TPs   Elijah CHRISTELLA Hidden, PT 11/17/2023, 4:18 PM

## 2023-11-20 ENCOUNTER — Encounter: Payer: Self-pay | Admitting: Family Medicine

## 2023-11-20 NOTE — Progress Notes (Signed)
 Subjective:    Patient ID: Stephanie [name], female    DOB: [birth year], 89 y.o.   MRN: [id number]  Chief Complaint  Patient presents with  . Follow-up    3 month    HPI Discussed the use of AI scribe software for clinical note transcription with the patient, who gave verbal consent to proceed.  History of Present Illness   The patient, with a history of Parkinson's disease, chronic pain, and depression, presents with multiple complaints. The chief complaint is worsening pain in the neck and back, which has been previously managed by a rheumatologist with Simponi  Aria infusions and a neurologist with Botox injections. The patient reports some relief from the Botox injections and improved sleep but is seeking further treatment options due to persistent pain.  The patient's Parkinson's disease is reportedly worsening, with increased tremors. The neurologist recently changed the patient's medication to Rytary , a form of carbidopa  levodopa , which the patient reports has led to severe constipation. The patient is currently managing the constipation with over-the-counter remedies but is seeking further treatment options due to lack of improvement.  The patient also reports feeling depressed, attributing this to the chronic pain and inability to participate in previously enjoyed activities such as going to church. The patient has a history of being treated with sertraline  for depression and is currently taking diazepam  for anxiety. The patient is open to restarting sertraline  to help manage the depression and potentially improve pain management.        Past Medical History:  Diagnosis Date  . Allergy   . Anemia    past hx   . Arthropathy of cervical spine 11/27/2012   Right  Xray at [facility]  On 04/08/2016  AP, lateral, and lateral flexion and extension views of the cervical spine are submitted for evaluation.   No acute fracture identified in the cervical spine.   There is redemonstration of  approximately 2 mm of C3 on C4 anterolisthesis in neutral which slightly increases in flexion relative to neutral and extension. Slight C5 on C6 retrolisthesis does not change i  . Benign hypertension without congestive heart failure   . Benign mole    right hcets mole, bleeds when dried with towel  . Bilateral dry eyes   . Bladder cancer (HCC) November 13, 2016  . CAD (coronary artery disease), native coronary artery 04/2023   Coronary CTA showed a coronary calcium  score to 46 with less than 25% left main, 25 to 49% proximal LAD, less than 25% left circumflex stenosis  . Cataract   . Cervical spondylosis   . Cervicalgia    2 screws in place   . Chronic kidney disease   . Depression with anxiety Nov 13, 2016   prior to husbands death   . Dyspnea   . Gross hematuria    developed after first hip replacment, led to indicental finding of bladder tumor, bleeding now resolved   . H/O total shoulder replacement, left   . History of kidney stones    1970's  . Hyperlipidemia   . Left hand weakness    due to reinjury of flxor tendon s/p tendon surgery march 12-2018  . Lumbar stenosis   . Malignant tumor of urinary bladder (HCC) 07/2016   Pre-stage I  . OA (osteoarthritis)   . Parkinson disease [facility]) dx 2012   neurologist-  dr siddiqui at [facility]  . Psoriatic arthritis [facility])     Dr Ishmael , [place] [facility]   . Rupture of flexor tendon of  hand 2020   right    Past Surgical History:  Procedure Laterality Date  . BIOPSY MASS LEFT FIRST METACARPAL   06/23/2006   benign  . CATARACT EXTRACTION W/ INTRAOCULAR LENS  IMPLANT, BILATERAL  1999 and 2003  . CERVICAL FUSION  02/2017   c1-c2 ; reports she has 2 screws 2in long in place done at [facility] ; patient exhibits VERY LIMITED NECK ROM  . COLONOSCOPY    . COSMETIC SURGERY    . CYSTOSCOPY W/ RETROGRADES Bilateral 09/22/2016   Procedure: CYSTOSCOPY WITH RETROGRADE PYELOGRAM;  Surgeon: Ricardo Likens, MD;  Location: [facility];   Service: Urology;  Laterality: Bilateral;  . D & C HYSTEROSCOPY W/ RESECTION POLYP  07/11/2000  . DILATION AND CURETTAGE OF UTERUS  1960's  . EXCISION CYST AND DEBRIDEMENT RIGHT WIRST AND REMOVAL FORGEIGN BODY  01/09/2009  . EYE SURGERY    . FLEXOR TENDON REPAIR Left 12/12/2018   Procedure: REPAIR/TRANSFER FLEXOR DIGITORUM PROFUNDUS OF LEFT SMALL FINGER;  Surgeon: Murrell Kuba, MD;  Location: [facility];  Service: Orthopedics;  Laterality: Left;  . JOINT REPLACEMENT    . LAPAROSCOPY  yrs ago   infertility   . METACARPOPHALANGEAL JOINT ARTHRODESIS Right 05/25/1996  . REVERSE SHOULDER ARTHROPLASTY Left 10/14/2022   Procedure: REVERSE SHOULDER ARTHROPLASTY;  Surgeon: Melita Drivers, MD;  Location: [facility];  Service: Orthopedics;  Laterality: Left;   . SPINE SURGERY    . TENDON REPAIR Left 01/15/2019   Hand  . TONSILLECTOMY AND ADENOIDECTOMY  child  . TOTAL HIP ARTHROPLASTY Left 07/07/2016   Procedure: LEFT TOTAL HIP ARTHROPLASTY ANTERIOR APPROACH;  Surgeon: Dempsey Moan, MD;  Location: [facility];  Service: Orthopedics;  Laterality: Left;  . TOTAL HIP ARTHROPLASTY Right 09/28/2017   Procedure: RIGHT TOTAL HIP ARTHROPLASTY ANTERIOR APPROACH;  Surgeon: Moan Dempsey, MD;  Location: [facility];  Service: Orthopedics;  Laterality: Right;  . TOTAL KNEE ARTHROPLASTY Right 07/30/2019   Procedure: TOTAL KNEE ARTHROPLASTY;  Surgeon: Moan Dempsey, MD;  Location: [facility];  Service: Orthopedics;  Laterality: Right;   . TRANSURETHRAL RESECTION OF BLADDER TUMOR N/A 09/22/2016   Procedure: TRANSURETHRAL RESECTION OF BLADDER TUMOR (TURBT);  Surgeon: Ricardo Likens, MD;  Location: [facility];  Service: Urology;  Laterality: N/A;    Family History  Problem Relation Age of Onset  . Arthritis Mother   . Hypertension Mother   . Deep vein thrombosis Mother        recurrent, secondary to Bleeding disorder  . Arthritis Father   . Stroke Father   . Bladder Cancer Father    . Diabetes Sister   . Heart disease Sister   . Obesity Sister   . Arthritis Sister   . Hypertension Sister   . Heart disease Maternal Grandmother   . Heart disease Maternal Grandfather   . Peripheral vascular disease Paternal Grandmother        s/p leg amputation  . Stroke Paternal Grandfather   . Esophageal cancer Paternal Aunt   . Hearing loss Sister   . Colon cancer Neg Hx   . Colon polyps Neg Hx   . Rectal cancer Neg Hx   . Stomach cancer Neg Hx     Social History   Socioeconomic History  . Marital status: Widowed    Spouse name: Not on file  . Number of children: Not on file  . Years of education: Not on file  . Highest education level: Bachelor's degree (e.g., BA, AB,  BS)  Occupational History  . Not on file  Tobacco Use  . Smoking status: Former    Current packs/day: 0.00    Types: Cigarettes    Start date: 05/04/1954    Quit date: 05/04/1964    Years since quitting: 59.5  . Smokeless tobacco: Never  . Tobacco comments:    Pt states that when she was smoking 1 pack of cigs would last her 3 days. 10/13/22 ALS   Vaping Use  . Vaping status: Never Used  Substance and Sexual Activity  . Alcohol use: Yes    Alcohol/week: 7.0 standard drinks of alcohol    Types: 7 Glasses of wine per week    Comment: ocassionally- social   . Drug use: No  . Sexual activity: Not Currently    Partners: Male  Other Topics Concern  . Not on file  Social History Narrative  . Not on file   Social Drivers of Health   Financial Resource Strain: Low Risk  (11/11/2023)   Overall Financial Resource Strain (CARDIA)   . Difficulty of Paying Living Expenses: Not hard at all  Food Insecurity: No Food Insecurity (11/11/2023)   Hunger Vital Sign   . Worried About Programme Researcher, Broadcasting/film/video in the Last Year: Never true   . Ran Out of Food in the Last Year: Never true  Transportation Needs: No Transportation Needs (11/11/2023)   PRAPARE - Transportation   . Lack of Transportation (Medical): No    . Lack of Transportation (Non-Medical): No  Physical Activity: Inactive (11/11/2023)   Exercise Vital Sign   . Days of Exercise per Week: 0 days   . Minutes of Exercise per Session: 0 min  Stress: No Stress Concern Present (11/11/2023)   [employer] of Occupational Health - Occupational Stress Questionnaire   . Feeling of Stress : Only a little  Social Connections: Moderately Integrated (11/11/2023)   Social Connection and Isolation Panel [NHANES]   . Frequency of Communication with Friends and Family: More than three times a week   . Frequency of Social Gatherings with Friends and Family: Once a week   . Attends Religious Services: 1 to 4 times per year   . Active Member of Clubs or Organizations: No   . Attends Banker Meetings: 1 to 4 times per year   . Marital Status: Widowed  Intimate Partner Violence: Patient Declined (10/14/2022)   Humiliation, Afraid, Rape, and Kick questionnaire   . Fear of Current or Ex-Partner: Patient declined   . Emotionally Abused: Patient declined   . Physically Abused: Patient declined   . Sexually Abused: Patient declined    Outpatient Medications Prior to Visit  Medication Sig Dispense Refill  . amLODipine  (NORVASC ) 5 MG tablet TAKE 1 TABLET (5 MG TOTAL) BY MOUTH DAILY. 90 tablet 3  . Apoaequorin (PREVAGEN PO) Take 1 tablet by mouth in the morning.    SABRA aspirin EC 81 MG tablet Take 81 mg by mouth daily. Swallow whole.    . Carbidopa -Levodopa  ER (RYTARY ) 48.75-195 MG CPCR Take 2 capsules by mouth 4 (four) times daily.    . cholecalciferol (VITAMIN D3) 25 MCG (1000 UNIT) tablet Take 1,000 Units by mouth in the morning.    . cholestyramine  (QUESTRAN ) 4 g packet Take 1 packet (4 g total) by mouth 2 (two) times daily as needed. 30 each 2  . Coenzyme Q10 (COQ10 PO) Take 1 tablet by mouth in the morning.    . diazepam  (VALIUM ) 5 MG tablet  TAKE 1 TABLET (5 MG TOTAL) BY MOUTH EVERY 12 (TWELVE) HOURS AS NEEDED. FOR ANXIETY 60 tablet 1   . DIGESTIVE ENZYMES PO Take 1 capsule by mouth 3 (three) times daily before meals. Active Enzymes by Vernita Hawks    . EPINEPHrine  0.3 mg/0.3 mL IJ SOAJ injection Inject 0.3 mg into the muscle as needed for anaphylaxis.    . fluticasone  (CUTIVATE ) 0.05 % cream Apply 1 application topically daily as needed (scalp psoriasis).  1  . golimumab  (SIMPONI  ARIA) 50 MG/4ML SOLN injection Inject 50 mg into the vein See admin instructions. Given every 2 months - Last given 06/28/2019    . hydrochlorothiazide  (MICROZIDE ) 12.5 MG capsule Take 1 capsule (12.5 mg total) by mouth daily. 90 capsule 1  . melatonin 5 MG TABS Take 5 mg by mouth at bedtime.    . meloxicam  (MOBIC ) 15 MG tablet Take 15 mg by mouth daily as needed (joint pain (max 3-4 times/weekly)).    . Multiple Vitamin (MULTIVITAMIN WITH MINERALS) TABS tablet Take 1 tablet by mouth in the morning. Centrum Silver    . Polyethyl Glycol-Propyl Glycol (SYSTANE) 0.4-0.3 % SOLN Place 1-2 drops into both eyes 3 (three) times daily as needed (dry/irritated eyes.).    SABRA Probiotic Product (PROBIOTIC PO) Take 1 capsule by mouth in the morning and at bedtime. Restore Ultimate Probiotic    . tiZANidine  (ZANAFLEX ) 4 MG tablet Take 2-4 mg by mouth every 8 (eight) hours as needed for muscle spasms. 30 tablet 0  . carbidopa -levodopa  (SINEMET  IR) 25-100 MG per tablet Take 0.5-1 tablets by mouth See admin instructions. Take 1 tablet by mouth in the morning upon waking & take 0.5 tablet by mouth every 2 hours thereafter for a total of 6 tablets daily. (Patient not taking: Reported on 10/18/2023)    . Carbidopa -Levodopa  ER (SINEMET  CR) 25-100 MG tablet controlled release Take 1 tablet by mouth at bedtime. (Patient not taking: Reported on 10/18/2023)    . ezetimibe  (ZETIA ) 10 MG tablet Take 1 tablet (10 mg total) by mouth daily. 90 tablet 3  . rosuvastatin  (CRESTOR ) 5 MG tablet Take 1 tablet (5 mg total) by mouth 2 (two) times a week. (Patient taking differently: Take 2.5 mg by  mouth 2 (two) times a week.) 30 tablet 1   No facility-administered medications prior to visit.    Allergies  Allergen Reactions  . Crestor  Angie.banana ] Other (See Comments)    Myalgias   . Cleocin [Clindamycin] Diarrhea  . Codeine  Nausea And Vomiting  . Hornet Venom   . Penicillins Itching and Swelling    Has patient had a PCN reaction causing immediate rash, facial/tongue/throat swelling, SOB or lightheadedness with hypotension: Yes Has patient had a PCN reaction causing severe rash involving mucus membranes or skin necrosis: No Has patient had a PCN reaction that required hospitalization: No Has patient had a PCN reaction occurring within the last 10 years: No If all of the above answers are NO, then may proceed with Cephalosporin use.   . Vibramycin  [Doxycycline ] Diarrhea  . Yellow Jacket Venom Swelling  . Lodine [Etodolac] Rash  . Tramadol Itching and Rash    Review of Systems  Constitutional:  Positive for malaise/fatigue. Negative for fever.  HENT:  Negative for congestion.   Eyes:  Negative for blurred vision.  Respiratory:  Negative for shortness of breath.   Cardiovascular:  Negative for chest pain, palpitations and leg swelling.  Gastrointestinal:  Positive for constipation. Negative for abdominal pain, blood in stool and  nausea.  Genitourinary:  Negative for dysuria and frequency.  Musculoskeletal:  Positive for back pain, joint pain and myalgias. Negative for falls.  Skin:  Negative for rash.  Neurological:  Negative for dizziness, loss of consciousness and headaches.  Endo/Heme/Allergies:  Negative for environmental allergies.  Psychiatric/Behavioral:  Negative for depression. The patient is not nervous/anxious.        Objective:    Physical Exam Constitutional:      General: She is not in acute distress.    Appearance: Normal appearance. She is well-developed. She is not toxic-appearing.  HENT:     Head: Normocephalic and atraumatic.     Right Ear:  External ear normal.     Left Ear: External ear normal.     Nose: Nose normal.  Eyes:     General:        Right eye: No discharge.        Left eye: No discharge.     Conjunctiva/sclera: Conjunctivae normal.  Neck:     Thyroid : No thyromegaly.  Cardiovascular:     Rate and Rhythm: Normal rate and regular rhythm.     Heart sounds: Normal heart sounds. No murmur heard. Pulmonary:     Effort: Pulmonary effort is normal. No respiratory distress.     Breath sounds: Normal breath sounds.  Abdominal:     General: Bowel sounds are normal.     Palpations: Abdomen is soft.     Tenderness: There is no abdominal tenderness. There is no guarding.  Musculoskeletal:        General: Normal range of motion.     Cervical back: Neck supple.  Lymphadenopathy:     Cervical: No cervical adenopathy.  Skin:    General: Skin is warm and dry.  Neurological:     Mental Status: She is alert and oriented to person, place, and time.  Psychiatric:        Mood and Affect: Mood normal.        Behavior: Behavior normal.        Thought Content: Thought content normal.        Judgment: Judgment normal.    BP 124/68 (BP Location: Right Arm, Patient Position: Sitting, Cuff Size: Normal)   Pulse 78   Temp 97.7 F (36.5 C) (Oral)   Resp 18   Ht 5' 2 (1.575 m)   Wt 112 lb 12.8 oz (51.2 kg)   LMP 01/13/2013 Comment: spotting-had benign endometrial biopsy   SpO2 93%   BMI 20.63 kg/m  Wt Readings from Last 3 Encounters:  11/15/23 112 lb 12.8 oz (51.2 kg)  05/30/23 108 lb 12.8 oz (49.4 kg)  03/30/23 111 lb 12.8 oz (50.7 kg)    Diabetic Foot Exam - Simple   No data filed    Lab Results  Component Value Date   WBC 8.3 05/30/2023   HGB 14.1 05/30/2023   HCT 42.7 05/30/2023   PLT 227.0 05/30/2023   GLUCOSE 109 (H) 05/30/2023   CHOL 140 05/30/2023   TRIG 95.0 05/30/2023   HDL 63.90 05/30/2023   LDLDIRECT 120.0 12/11/2014   LDLCALC 57 05/30/2023   ALT 9 05/30/2023   AST 25 05/30/2023   NA 130 (L)  05/30/2023   K 4.2 05/30/2023   CL 91 (L) 05/30/2023   CREATININE 0.93 05/30/2023   BUN 26 (H) 05/30/2023   CO2 28 05/30/2023   TSH 1.29 05/30/2023   INR 1.0 07/25/2019   HGBA1C 5.6 02/28/2023    Lab Results  Component Value Date   TSH 1.29 05/30/2023   Lab Results  Component Value Date   WBC 8.3 05/30/2023   HGB 14.1 05/30/2023   HCT 42.7 05/30/2023   MCV 91.0 05/30/2023   PLT 227.0 05/30/2023   Lab Results  Component Value Date   NA 130 (L) 05/30/2023   K 4.2 05/30/2023   CO2 28 05/30/2023   GLUCOSE 109 (H) 05/30/2023   BUN 26 (H) 05/30/2023   CREATININE 0.93 05/30/2023   BILITOT 0.9 05/30/2023   ALKPHOS 46 05/30/2023   AST 25 05/30/2023   ALT 9 05/30/2023   PROT 7.0 05/30/2023   ALBUMIN 4.8 05/30/2023   CALCIUM  10.1 05/30/2023   ANIONGAP 11 11/12/2022   EGFR 77 03/30/2023   GFR 54.54 (L) 05/30/2023   Lab Results  Component Value Date   CHOL 140 05/30/2023   Lab Results  Component Value Date   HDL 63.90 05/30/2023   Lab Results  Component Value Date   LDLCALC 57 05/30/2023   Lab Results  Component Value Date   TRIG 95.0 05/30/2023   Lab Results  Component Value Date   CHOLHDL 2 05/30/2023   Lab Results  Component Value Date   HGBA1C 5.6 02/28/2023       Assessment & Plan:  Primary hypertension Assessment & Plan: Well controlled, no changes to meds. Encouraged heart healthy diet such as the DASH diet and exercise as tolerated.    Orders: -     CBC with Differential/Platelet; Future -     Comprehensive metabolic panel; Future -     TSH; Future  Pure hypercholesterolemia Assessment & Plan: Tolerating statin, encouraged heart healthy diet, avoid trans fats, minimize simple carbs and saturated fats. Increase exercise as tolerated  Orders: -     Lipid panel; Future  Insomnia, unspecified type Assessment & Plan: Has trouble maintaining sleep due to tremor but diazepam  is helpful is allowed a refill   Vitamin D  deficiency Assessment  & Plan: Supplement and monitor    Hyperglycemia -     Hemoglobin A1c; Future  Depression with anxiety  Other orders -     Sertraline  HCl; Take 1 tablet (25 mg total) by mouth daily.  Dispense: 30 tablet; Refill: 3    Assessment and Plan    Parkinson's Disease Worsening tremors managed by Dr. Rosalia. Recent change in medication to Rytary  (carbidopa  levodopa ) has led to increased constipation and dyskinesia. -Continue Rytary  at patient's tolerated dose (approximately 6 capsules daily). -Consider physical therapy for Parkinson's.  Chronic Pain Persistent pain in neck, back, and extremities. Recent Botox treatment provided some relief. Patient is also on Simponi  Aria infusion via rheumatologist, Dr. Ishmael. -Continue Botox treatment as prescribed by Dr. Rosalia. -Review potential changes in treatment with Dr. Lavell during appointment on 12/09/2023.  Depression Patient reports feeling depressed due to limitations from chronic conditions. Currently taking diazepam  as needed for anxiety and muscle tension. -Start Sertraline  25mg  daily for depression. -Continue diazepam  as needed. -Consider cognitive behavioral therapy for insomnia (CBTi).  Constipation Worsening constipation likely secondary to change in Parkinson's medication. -Implement bowel regimen:Milk of Magnesia 2 tablespoons and warm prune juice 4 ounces if no bowel movement after 2 days. If unsuccessful, repeat in 2-4 hours with addition of glycerin or Melcholax suppository. -Consider daily MiraLAX  and Benefiber if constipation persists.  General Health Maintenance / Followup Plans -Order routine blood work to be done next week. -Schedule virtual visit in 6-8 weeks to assess response to Sertraline  and bowel regimen. -Schedule  in-person follow-up in 3-4 months. -Consider exploring cognitive behavioral therapy for insomnia (CBTi) online.         Harlene Horton, MD

## 2023-11-21 ENCOUNTER — Encounter: Payer: Self-pay | Admitting: Physical Therapy

## 2023-11-21 ENCOUNTER — Ambulatory Visit: Payer: Medicare Other | Admitting: Physical Therapy

## 2023-11-21 DIAGNOSIS — M542 Cervicalgia: Secondary | ICD-10-CM

## 2023-11-21 DIAGNOSIS — R293 Abnormal posture: Secondary | ICD-10-CM

## 2023-11-21 DIAGNOSIS — L4050 Arthropathic psoriasis, unspecified: Secondary | ICD-10-CM | POA: Diagnosis not present

## 2023-11-21 DIAGNOSIS — Z79899 Other long term (current) drug therapy: Secondary | ICD-10-CM | POA: Diagnosis not present

## 2023-11-21 DIAGNOSIS — R2681 Unsteadiness on feet: Secondary | ICD-10-CM | POA: Diagnosis not present

## 2023-11-21 DIAGNOSIS — R2689 Other abnormalities of gait and mobility: Secondary | ICD-10-CM | POA: Diagnosis not present

## 2023-11-21 DIAGNOSIS — M546 Pain in thoracic spine: Secondary | ICD-10-CM

## 2023-11-21 DIAGNOSIS — M154 Erosive (osteo)arthritis: Secondary | ICD-10-CM | POA: Diagnosis not present

## 2023-11-21 DIAGNOSIS — L409 Psoriasis, unspecified: Secondary | ICD-10-CM | POA: Diagnosis not present

## 2023-11-21 DIAGNOSIS — Z6820 Body mass index (BMI) 20.0-20.9, adult: Secondary | ICD-10-CM | POA: Diagnosis not present

## 2023-11-21 NOTE — Therapy (Signed)
 OUTPATIENT PHYSICAL THERAPY TREATMENT   Patient Name: Stephanie [name] MRN: [id number] DOB:10-29-1932, 89 y.o., female Today's Date: 11/21/2023   END OF SESSION:  PT End of Session - 11/21/23 1445     Visit Number 5    Date for PT Re-Evaluation 12/13/23    Authorization Type Medicare & BCBS    PT Start Time 1445    PT Stop Time 1532    PT Time Calculation (min) 47 min    Activity Tolerance Patient tolerated treatment well    Behavior During Therapy [facility] for tasks assessed/performed                Past Medical History:  Diagnosis Date   Allergy    Anemia    past hx    Arthropathy of cervical spine 11/27/2012   Right  Xray at [facility]  On 04/08/2016  AP, lateral, and lateral flexion and extension views of the cervical spine are submitted for evaluation.   No acute fracture identified in the cervical spine.   There is redemonstration of approximately 2 mm of C3 on C4 anterolisthesis in neutral which slightly increases in flexion relative to neutral and extension. Slight C5 on C6 retrolisthesis does not change i   Benign hypertension without congestive heart failure    Benign mole    right hcets mole, bleeds when dried with towel   Bilateral dry eyes    Bladder cancer (HCC) October 24, 2016   CAD (coronary artery disease), native coronary artery 04/2023   Coronary CTA showed a coronary calcium  score to 46 with less than 25% left main, 25 to 49% proximal LAD, less than 25% left circumflex stenosis   Cataract    Cervical spondylosis    Cervicalgia    2 screws in place    Chronic kidney disease    Depression with anxiety 10/24/2016   prior to husbands death    Dyspnea    Gross hematuria    developed after first hip replacment, led to indicental finding of bladder tumor, bleeding now resolved    H/O total shoulder replacement, left    History of kidney stones    1970's   Hyperlipidemia    Left hand weakness    due to reinjury of flxor tendon s/p tendon surgery march 12-2018    Lumbar stenosis    Malignant tumor of urinary bladder (HCC) 07/2016   Pre-stage I   OA (osteoarthritis)    Parkinson disease [facility]) dx 2012   neurologist-  dr siddiqui at [facility]   Psoriatic arthritis [facility])     Dr Ishmael , [place] rhe[facility]    Rupture of flexor tendon of hand 2020   right   Past Surgical History:  Procedure Laterality Date   BIOPSY MASS LEFT FIRST METACARPAL   06/23/2006   benign   CATARACT EXTRACTION W/ INTRAOCULAR LENS  IMPLANT, BILATERAL  1999 and 2003   CERVICAL FUSION  02/2017   c1-c2 ; reports she has 2 screws 2in long in place done at [facility] ; patient exhibits VERY LIMITED NECK ROM   COLONOSCOPY     COSMETIC SURGERY     CYSTOSCOPY W/ RETROGRADES Bilateral 09/22/2016   Procedure: CYSTOSCOPY WITH RETROGRADE PYELOGRAM;  Surgeon: Ricardo Likens, MD;  Location: [facility];  Service: Urology;  Laterality: Bilateral;   D & C HYSTEROSCOPY W/ RESECTION POLYP  07/11/2000   DILATION AND CURETTAGE OF UTERUS  1960's   EXCISION CYST AND DEBRIDEMENT RIGHT WIRST AND REMOVAL FORGEIGN BODY  01/09/2009   EYE SURGERY     FLEXOR TENDON REPAIR Left 12/12/2018   Procedure: REPAIR/TRANSFER FLEXOR DIGITORUM PROFUNDUS OF LEFT SMALL FINGER;  Surgeon: Murrell Kuba, MD;  Location: [facility];  Service: Orthopedics;  Laterality: Left;   JOINT REPLACEMENT     LAPAROSCOPY  yrs ago   infertility    METACARPOPHALANGEAL JOINT ARTHRODESIS Right 05/25/1996   REVERSE SHOULDER ARTHROPLASTY Left 10/14/2022   Procedure: REVERSE SHOULDER ARTHROPLASTY;  Surgeon: Melita Drivers, MD;  Location: [facility];  Service: Orthopedics;  Laterality: Left;    SPINE SURGERY     TENDON REPAIR Left 01/15/2019   Hand   TONSILLECTOMY AND ADENOIDECTOMY  child   TOTAL HIP ARTHROPLASTY Left 07/07/2016   Procedure: LEFT TOTAL HIP ARTHROPLASTY ANTERIOR APPROACH;  Surgeon: Dempsey Moan, MD;  Location: [facility];  Service: Orthopedics;  Laterality: Left;   TOTAL HIP ARTHROPLASTY  Right 09/28/2017   Procedure: RIGHT TOTAL HIP ARTHROPLASTY ANTERIOR APPROACH;  Surgeon: Moan Dempsey, MD;  Location: [facility];  Service: Orthopedics;  Laterality: Right;   TOTAL KNEE ARTHROPLASTY Right 07/30/2019   Procedure: TOTAL KNEE ARTHROPLASTY;  Surgeon: Moan Dempsey, MD;  Location: [facility];  Service: Orthopedics;  Laterality: Right;    TRANSURETHRAL RESECTION OF BLADDER TUMOR N/A 09/22/2016   Procedure: TRANSURETHRAL RESECTION OF BLADDER TUMOR (TURBT);  Surgeon: Ricardo Likens, MD;  Location: [facility];  Service: Urology;  Laterality: N/A;   Patient Active Problem List   Diagnosis Date Noted   CAD (coronary artery disease), native coronary artery 04/20/2023   S/P reverse total shoulder arthroplasty, left 10/14/2022   Preoperative cardiovascular examination 10/04/2022   SOB (shortness of breath) 08/02/2022   Insomnia 08/02/2022   Hyponatremia 08/01/2022   Shoulder pain, left 09/14/2021   History of COVID-19 01/06/2021   Diarrhea 09/03/2020   Vitamin D  deficiency 09/03/2020   OA (osteoarthritis) of knee 07/30/2019   Tinea corporis 06/08/2019   Psoriatic arthritis (HCC) 04/02/2019   Anemia 03/13/2018   Hot flashes 03/13/2018   S/P cervical spinal fusion 03/31/2017   Neck pain 12/12/2016   Depression with anxiety 10/17/2016   Bladder cancer (HCC) 10/17/2016   OA (osteoarthritis) of hip 07/07/2016   Spinal stenosis of lumbar region with radiculopathy 04/08/2016   Spondylolisthesis of lumbar region 04/08/2016   Spondylosis of cervical region without myelopathy or radiculopathy 04/08/2016   Preventative health care 01/18/2016   Low back pain 01/18/2016   History of chicken pox    Hyperlipidemia 12/15/2014   Allergic rhinitis 06/25/2014   Allergy to bee sting 06/10/2014   TMJ tenderness 11/16/2013   Other malaise and fatigue 05/07/2013   Arthropathy of cervical spine (HCC) 11/27/2012   Muscle spasms of neck 11/27/2012   Parkinson's disease (HCC)  05/24/2012   Epiretinal membrane 03/09/2012   Hyperopia with astigmatism and presbyopia 03/09/2012   Pseudophakia 03/09/2012   HTN (hypertension) 08/31/2011   Right knee pain 08/31/2011   Benign hypertensive heart disease without heart failure 05/12/2011    PCP: Domenica Harlene LABOR, MD   REFERRING PROVIDER: Rosalia Hollering, MD   REFERRING DIAG: G20.A1 Parkinson's disease w/o dyskinesia or fluctuating manifestations  THERAPY DIAG:  Other abnormalities of gait and mobility  Unsteadiness on feet  Abnormal posture  Cervicalgia  Pain in thoracic spine  RATIONALE FOR EVALUATION AND TREATMENT: Rehabilitation  ONSET DATE: Diagnosed with PD in ~2012  NEXT MD VISIT: 11/23/2023 - for Botox injections in neck and back   SUBJECTIVE:  SUBJECTIVE STATEMENT: Pt reports Dr. Ishmael told her this morning that her pain is from OA and not her psoriatic arthritis. She feels that her pain is worse today due to the inclement weather - has spent most of the day since her MD appt this morning sitting with her heating.  She will follow-up with her referring provider for the Botox injections for her neck on 11/23/2023.  EVAL: Pt reports her neurologist has changed her PD meds from Sinemet  to Rytary  but they are still working on the right dosage as the current dosage leads to increased dyskinesia.  She notes more stiffness from the PD.  She notes much of her difficulty also seems to stem from her chronic neck and back pain.  She got some relief from Botox injections at 5 different places in her neck in Oct 2024 but she feels like the effects are starting to fade.  She notes more difficulty with walking related to the pain in her neck and pain - feels like she cannot stand up straight.  Pain keeps her from going to  church or walking for exercise.  PAIN: Are you having pain? Yes: NPRS scale: 3/10 Pain location: L neck/thoracic spine & upper shoulder into L UE and periscapular area Pain description: constant sharp ache Aggravating factors: sitting up straight, standing and walking Relieving factors: sitting in recliner, heat & vibration, ice, Meloxicam , Tylenol   PERTINENT HISTORY:  Chronic neck pain, L shoulder advanced OA s/p reverse TSA on 10/14/22; R TKA 07/2019, B THR 2018 & 2017, posterior C1-2 cervical fusion 2018, rupture of flexor tendon of L hand with failed surgical repair 2020, Parkinson's, psoriatic arthritis, bladder cancer, HTN    PRECAUTIONS: Fall  RED FLAGS: None  WEIGHT BEARING RESTRICTIONS: No  FALLS:  Has patient fallen in last 6 months? No  LIVING ENVIRONMENT: Lives with: lives alone Lives in: House/apartment Stairs: Yes: External: 4 steps; on right going up, on left going up, and can reach both Has following equipment at home: Single point cane, Walker - 2 wheeled, Tour manager, bed side commode, and Grab bars  OCCUPATION: Retired  PLOF: Independent and Leisure: used to agricultural consultant at [employer], bible study, and walk 1/3 mile daily but recently unable    PATIENT GOALS: Be able to walk (for exercise) again and go to church.   OBJECTIVE: (objective measures completed at initial evaluation unless otherwise dated)  DIAGNOSTIC FINDINGS:  N/A  COGNITION: Overall cognitive status: Within functional limits for tasks assessed and feels like her memory is not as good as it once was   SENSATION: WFL  COORDINATION: Heel/toe WNL. Heel to shin limited more but muscle tightness/stiffness from prior joint replacements.  MUSCLE LENGTH:  TBA as indicated Hamstrings:  ITB:  Piriformis:  Hip flexors:  Quads:  Heelcord:   POSTURE:  rounded shoulders, forward head, weight shift left, and head rests in 25 flexion & 8 R lateral flexion  CERVICAL ROM:  Head rests in 25  flexion & 8 R lateral flexion (10/18/23)  Active ROM Eval  Flexion 58  Extension 10  Right lateral flexion 20  Left lateral flexion 5  Right rotation 40  Left rotation 22   (Blank rows = not tested)  UPPER EXTREMITY ROM:  Active ROM Right eval Left eval  Shoulder flexion 140 119  Shoulder extension    Shoulder abduction 127 105  Shoulder adduction    Shoulder internal rotation    Shoulder external rotation    Elbow flexion    Elbow  extension    Wrist flexion    Wrist extension    Wrist ulnar deviation    Wrist radial deviation    Wrist pronation    Wrist supination     (Blank rows = not tested)  UPPER EXTREMITY MMT:  MMT Right eval Left eval  Shoulder flexion 5 5  Shoulder extension 4 4+  Shoulder abduction 5 4+  Shoulder adduction    Shoulder internal rotation 4+ 4  Shoulder external rotation 4 4  Middle trapezius    Lower trapezius     (Blank rows = not tested)   LOWER EXTREMITY ROM:    Limited B hip IR/ER but otherwise WFL  LOWER EXTREMITY MMT:  Tested in sitting on eval  MMT Right eval Left eval  Hip flexion 4 4  Hip extension 4 4  Hip abduction 4+ 4+  Hip adduction 4+ 4+  Hip internal rotation 4+ 4+  Hip external rotation 4 4  Knee flexion 5 5  Knee extension 5 5  Ankle dorsiflexion 4+ 4+  Ankle plantarflexion    Ankle inversion    Ankle eversion    (Blank rows = not tested)  BED MOBILITY:  NT  TRANSFERS: Assistive device utilized: None  Sit to stand: Complete Independence Stand to sit: Complete Independence Chair to chair: Complete Independence Floor:  NT  GAIT: Distance walked: Clinic distances Assistive device utilized: None Level of assistance: Complete Independence Gait pattern: step through pattern, decreased arm swing- Right, decreased arm swing- Left, lateral lean- Right, and decreased trunk rotation Comment: Gait speed = 2.97 ft/sec  RAMP: Level of Assistance:  NT Assistive device utilized: None Ramp Comments:   CURB:   Level of Assistance:  NT Assistive device utilized: None Curb Comments:   STAIRS:  Level of Assistance:     Psychologist, Counselling Technique: Alternating Pattern  with Single Rail on Right  Number of Stairs:    Height of Stairs:   Comments:   FUNCTIONAL TESTS:  5 times sit to stand: 11.97 sec Timed up and go (TUG): Normal - 10.85 sec, Manual - 11.22 sec, Cognitive - 12.47 sec 10 meter walk test: 11.03 sec; Gait speed = 2.97 ft/sec  Berg Balance Scale: 46/56, 46-51 moderate fall risk (>50%)  (11/03/23) Functional gait assessment: 15/30, < 19 = high risk fall (11/03/23)  Baselines as of D/C from last PT episode for Parkinson's (09/2022): 5 times sit to stand: 12.31 sec Berg Balance Scale: 54/56 TUG, & FGA not assessed due to patient unable to return for final visit  Baselines as of D/C from PT (02/2022):  Timed up and go (TUG): 8.88 sec (Normal) 10 meter walk test: 8.82 sec; Gait speed: 3.72 ft/sec  Functional gait assessment: 26/30  PATIENT SURVEYS:  ABC scale 1030 / 1600 = 64.4 %   TODAY'S TREATMENT:   11/21/23 THERAPEUTIC EXERCISE: to improve flexibility, strength and mobility.  Demonstration, verbal and tactile cues throughout for technique.  NuStep - L5 x 6 min (L UE & B LE to promote reciprocal motion) R/L lunge with UE support on back of chair x 10 each - cues to avoid knees fwd of toes Standing alt hip extension with looped RTB at ankles x 10 bil Standing alt hip flexion march with looped RTB at midfeet x 10 bil Seated B hip ER clam with looped RTB at knees x 10  NEUROMUSCULAR RE-EDUCATION: To improve posture, balance, coordination, reduce fall risk, amplitude of movement, speed of movement to reduce bradykinesia, and reduce rigidity.  Standing PWR! Moves - Up, Rock, Twist, and Step x 10 Supine PWR! Moves - Up, Rock, Twist and Step x 10   11/17/23  THERAPEUTIC EXERCISE: to improve flexibility, strength and mobility.  Demonstration, verbal and tactile cues  throughout for technique.  NuStep - L5 x 5 min - L UE & B LE initially to promote reciprocal motion (R UE deferred due to tendon injuries in wrist), however reduced to LE only due to c/o increased neck and upper shoulder pain  NEUROMUSCULAR RE-EDUCATION: To improve posture, balance, coordination, reduce fall risk, amplitude of movement, speed of movement to reduce bradykinesia, and reduce rigidity.  Standing PWR! Moves - Up, Rock, Twist, and Step x 10 Supine PWR! Moves - Up, Rock, and Step x 10;  Twist x 5 (stopped d/t increased neck pain)  MANUAL THERAPY: To promote normalized muscle tension, improved flexibility, improved joint mobility, increased ROM, and reduced pain.  STM/DTM and manual TPR to L UT, LS and scalenes   11/07/23  THERAPEUTIC EXERCISE: to improve flexibility, strength and mobility.  Demonstration, verbal and tactile cues throughout for technique.  NuStep - L5 x 6 min (L UE & B LE to promote reciprocal motion) Completed review of HEP handouts from prior PT episodes identifying most relevant exercises for current issues Seated cervical + scap retraction into towel roll along spine in back of chair 10 x 3  MANUAL THERAPY: To promote normalized muscle tension, improved flexibility, increased ROM, and reduced pain.  STM/DTM and manual TPR to L UT and LS Gentle manual stretching for B UT & LS  NEUROMUSCULAR RE-EDUCATION: To improve posture, balance, coordination, amplitude of movement, speed of movement to reduce bradykinesia, and reduce rigidity.  Seated PWR! Moves - Up, [employer], [employer], and Step x 10   PATIENT EDUCATION:  Education details: PWR! Moves - standing and supine   Person educated: Patient Education method: Explanation, Demonstration, Verbal cues, and Handouts Education comprehension: verbalized understanding, returned demonstration, verbal cues required, and needs further education  HOME EXERCISE PROGRAM: Access Code: [email or web address] URL:  [email or web address] Date: 11/21/2023 Prepared by: Elijah Hidden  Exercises - Seated Hamstring Stretch  - 2 x daily - 7 x weekly - 3 reps - 30 sec hold - Seated Hip Abduction with Resistance  - 1 x daily - 3-4 x weekly - 2 sets - 10 reps - 3-5 sec hold - Seated March with Resistance  - 1 x daily - 3-4 x weekly - 2 sets - 10 reps - 3 sec hold - Sit to Stand with Resistance Around Legs  - 1 x daily - 3-4 x weekly - 2 sets - 10 reps - Seated Shoulder Setting  - 1 x daily - 7 x weekly - 2 sets - 10 reps - 3 sec hold - Standing Bilateral Low Shoulder Row with Anchored Resistance  - 1 x daily - 3-4 x weekly - 2 sets - 10 reps - 5 sec hold - Scapular Retraction with Resistance Advanced  - 1 x daily - 3-4 x weekly - 2 sets - 10 reps - 5 sec hold - Marching with Resistance  - 1 x daily - 3 x weekly - 2 sets - 10 reps - 3 sec hold - Standing Hip Extension with Resistance at Ankles and Counter Support  - 1 x daily - 3 x weekly - 2 sets - 10 reps - 3 sec hold  PWR! Moves: - Sitting - Standing - Supine   ASSESSMENT:  CLINICAL IMPRESSION: Dona reports increased pain  due the inclement weather today and is frustrated that her rheumatologist is unable to offer anything that will help as she feels that the pain is due to her OA. She is able to get temporary relief from heat and manual self-TPR with Theracane and is hopeful that the Botox injections on Wed 11/23/23 will help.  Reviewed standing and supine PWR! Moves today with patient able to perform good return demonstration with only minor cues.  She notes mild increase in pain with some of the UE movements but no enough that she is unable to tolerate the patterns.  Progressed LE strengthening to include more standing exercises with good tolerance, therefore HEP updated to reflect some of this progression.  Almeter will benefit from continued skilled PT to address ongoing deficits to improve mobility and activity tolerance with decreased pain  interference.   OBJECTIVE IMPAIRMENTS: Abnormal gait, cardiopulmonary status limiting activity, decreased activity tolerance, decreased balance, decreased coordination, decreased endurance, decreased knowledge of condition, decreased mobility, difficulty walking, decreased ROM, decreased strength, hypomobility, increased fascial restrictions, impaired perceived functional ability, increased muscle spasms, impaired flexibility, improper body mechanics, postural dysfunction, and pain.   ACTIVITY LIMITATIONS: carrying, lifting, bending, sitting, standing, squatting, sleeping, stairs, reach over head, and locomotion level  PARTICIPATION LIMITATIONS: meal prep, cleaning, laundry, driving, shopping, community activity, and church  PERSONAL FACTORS: Age, Past/current experiences, Time since onset of injury/illness/exacerbation, and 3+ comorbidities: Chronic neck pain, L shoulder advanced OA s/p reverse TSA on 10/14/22; R TKA 07/2019, B THR 2018 & 2017, posterior C1-2 cervical fusion 2018, rupture of flexor tendon of L hand with failed surgical repair 2020, Parkinson's, psoriatic arthritis, bladder cancer, HTN  are also affecting patient's functional outcome.   REHAB POTENTIAL: Good  CLINICAL DECISION MAKING: Evolving/moderate complexity  EVALUATION COMPLEXITY: Moderate   GOALS: Goals reviewed with patient? Yes  SHORT TERM GOALS: Target date: 11/15/2023  Complete assessment of Berg and FGA and update goals and POC accordingly. Baseline:  Goal status: MET - 11/03/23  2.  Patient will be independent with initial HEP. Baseline:  Goal status: MET - 11/21/23   LONG TERM GOALS: Target date: 12/13/2023  Patient will be independent with ongoing/advanced HEP for self-management at home incorporating PWR! Moves as indicated .  Baseline:  Goal status: IN PROGRESS  2.  Patient will be able to ambulate 600' w/o AD with good safety to access community.  Baseline:  Goal status: IN PROGRESS  3.  Patient  will be able to step up/down curb safely with LRAD for safety with community ambulation.  Baseline:  Goal status: IN PROGRESS   4.  Patient will report 25-50% reduction in L-sided neck/thoracic, scapular and shoulder pain to improve activity tolerance for mobility and ambulation.  Baseline: Goal status: IN PROGRESS  5.  Patient will demonstrate gait speed of >/= 3.5 ft/sec to be a safe community ambulator with decreased risk for recurrent falls.  Baseline: 2.97 ft/sec Goal status: IN PROGRESS  6.  Patient will demonstrate at least 24/30 on FGA to improve gait stability and reduce risk for falls. (MCID = 4 points) Baseline: 15/30 (11/03/23) Goal status: IN PROGRESS  7.  Patient will improve Berg score to >/= 52/56 to improve safety and stability with ADLs in standing and reduce risk for falls. (MCID = 8 points)   Baseline: 46/56 (11/03/23) Goal status: IN PROGRESS  8.  Patient will report >/= 80% on ABC scale to demonstrate improved balance confidence and decreased risk for falls. Baseline: 64.4% Goal status: IN PROGRESS  9. Patient will verbalize understanding of local Parkinson's disease community resources, including community fitness post d/c. Baseline:  Goal status: IN PROGRESS   PLAN:  PT FREQUENCY: 2x/week  PT DURATION: 8 weeks  PLANNED INTERVENTIONS: 97164- PT Re-evaluation, 97110-Therapeutic exercises, 97530- Therapeutic activity, [id number]- Neuromuscular re-education, 97535- Self Care, [id number]- Manual therapy, [id number]- Gait training, 97014- Electrical stimulation (unattended), [id number]- Electrical stimulation (manual), [id number]- Ultrasound, [id number]- Ionotophoresis 4mg /ml Dexamethasone , Patient/Family education, Balance training, Stair training, Taping, Dry Needling, Joint mobilization, Spinal mobilization, Cryotherapy, and Moist heat  PLAN FOR NEXT SESSION: Review and progression of PWR! Moves as tolerated; core/LE and postural strengthening; MT +/- DN to address abnormal muscle tension  and TPs   Elijah CHRISTELLA Hidden, PT 11/21/2023, 3:47 PM

## 2023-11-22 ENCOUNTER — Other Ambulatory Visit (INDEPENDENT_AMBULATORY_CARE_PROVIDER_SITE_OTHER): Payer: Medicare Other

## 2023-11-22 DIAGNOSIS — I10 Essential (primary) hypertension: Secondary | ICD-10-CM

## 2023-11-22 DIAGNOSIS — R739 Hyperglycemia, unspecified: Secondary | ICD-10-CM | POA: Diagnosis not present

## 2023-11-22 DIAGNOSIS — E7800 Pure hypercholesterolemia, unspecified: Secondary | ICD-10-CM

## 2023-11-22 LAB — COMPREHENSIVE METABOLIC PANEL
ALT: 4 U/L (ref 0–35)
AST: 22 U/L (ref 0–37)
Albumin: 4.7 g/dL (ref 3.5–5.2)
Alkaline Phosphatase: 49 U/L (ref 39–117)
BUN: 21 mg/dL (ref 6–23)
CO2: 30 meq/L (ref 19–32)
Calcium: 9.4 mg/dL (ref 8.4–10.5)
Chloride: 91 meq/L — ABNORMAL LOW (ref 96–112)
Creatinine, Ser: 0.64 mg/dL (ref 0.40–1.20)
GFR: 78.11 mL/min (ref >60.00)
Glucose, Bld: 94 mg/dL (ref 70–99)
Potassium: 3.8 meq/L (ref 3.5–5.1)
Sodium: 129 meq/L — ABNORMAL LOW (ref 135–145)
Total Bilirubin: 0.9 mg/dL (ref 0.2–1.2)
Total Protein: 6.4 g/dL (ref 6.0–8.3)

## 2023-11-22 LAB — CBC WITH DIFFERENTIAL/PLATELET
Basophils Absolute: 0 10*3/uL (ref 0.0–0.1)
Basophils Relative: 0.4 % (ref 0.0–3.0)
Eosinophils Absolute: 0.1 10*3/uL (ref 0.0–0.7)
Eosinophils Relative: 1.5 % (ref 0.0–5.0)
HCT: 41.1 % (ref 36.0–46.0)
Hemoglobin: 13.8 g/dL (ref 12.0–15.0)
Lymphocytes Relative: 23.8 % (ref 12.0–46.0)
Lymphs Abs: 1.4 10*3/uL (ref 0.7–4.0)
MCHC: 33.5 g/dL (ref 30.0–36.0)
MCV: 90.9 fL (ref 78.0–100.0)
Monocytes Absolute: 0.5 10*3/uL (ref 0.1–1.0)
Monocytes Relative: 8.2 % (ref 3.0–12.0)
Neutro Abs: 3.9 10*3/uL (ref 1.4–7.7)
Neutrophils Relative %: 66.1 % (ref 43.0–77.0)
Platelets: 222 10*3/uL (ref 150.0–400.0)
RBC: 4.52 Mil/uL (ref 3.87–5.11)
RDW: 13.5 % (ref 11.5–15.5)
WBC: 5.9 10*3/uL (ref 4.0–10.5)

## 2023-11-22 LAB — TSH: TSH: 1.54 u[IU]/mL (ref 0.35–5.50)

## 2023-11-22 LAB — LIPID PANEL
Cholesterol: 143 mg/dL (ref 0–200)
HDL: 57.3 mg/dL (ref >39.00)
LDL Cholesterol: 68 mg/dL (ref 0–99)
NonHDL: 85.26
Total CHOL/HDL Ratio: 2
Triglycerides: 85 mg/dL (ref 0.0–149.0)
VLDL: 17 mg/dL (ref 0.0–40.0)

## 2023-11-22 LAB — HEMOGLOBIN A1C: Hgb A1c MFr Bld: 5.6 % (ref 4.6–6.5)

## 2023-11-23 DIAGNOSIS — G243 Spasmodic torticollis: Secondary | ICD-10-CM | POA: Diagnosis not present

## 2023-11-23 DIAGNOSIS — G2589 Other specified extrapyramidal and movement disorders: Secondary | ICD-10-CM | POA: Diagnosis not present

## 2023-11-24 ENCOUNTER — Ambulatory Visit: Payer: Medicare Other | Admitting: Physical Therapy

## 2023-11-24 ENCOUNTER — Encounter: Payer: Self-pay | Admitting: Physical Therapy

## 2023-11-24 DIAGNOSIS — R2681 Unsteadiness on feet: Secondary | ICD-10-CM

## 2023-11-24 DIAGNOSIS — M546 Pain in thoracic spine: Secondary | ICD-10-CM

## 2023-11-24 DIAGNOSIS — R2689 Other abnormalities of gait and mobility: Secondary | ICD-10-CM | POA: Diagnosis not present

## 2023-11-24 DIAGNOSIS — M542 Cervicalgia: Secondary | ICD-10-CM | POA: Diagnosis not present

## 2023-11-24 DIAGNOSIS — R293 Abnormal posture: Secondary | ICD-10-CM | POA: Diagnosis not present

## 2023-11-24 NOTE — Therapy (Signed)
 OUTPATIENT PHYSICAL THERAPY TREATMENT   Patient Name: Stephanie [name] MRN: [id number] DOB:08-Oct-1932, 89 y.o., female Today's Date: 11/24/2023   END OF SESSION:  PT End of Session - 11/24/23 1535     Visit Number 6    Date for PT Re-Evaluation 12/13/23    Authorization Type Medicare & BCBS    PT Start Time 1535    PT Stop Time 1616    PT Time Calculation (min) 41 min    Activity Tolerance Patient tolerated treatment well    Behavior During Therapy WFL for tasks assessed/performed                Past Medical History:  Diagnosis Date   Allergy    Anemia    past hx    Arthropathy of cervical spine 11/27/2012   Right  Xray at [facility]  On 04/08/2016  AP, lateral, and lateral flexion and extension views of the cervical spine are submitted for evaluation.   No acute fracture identified in the cervical spine.   There is redemonstration of approximately 2 mm of C3 on C4 anterolisthesis in neutral which slightly increases in flexion relative to neutral and extension. Slight C5 on C6 retrolisthesis does not change i   Benign hypertension without congestive heart failure    Benign mole    right hcets mole, bleeds when dried with towel   Bilateral dry eyes    Bladder cancer (HCC) 11-02-16   CAD (coronary artery disease), native coronary artery 04/2023   Coronary CTA showed a coronary calcium  score to 46 with less than 25% left main, 25 to 49% proximal LAD, less than 25% left circumflex stenosis   Cataract    Cervical spondylosis    Cervicalgia    2 screws in place    Chronic kidney disease    Depression with anxiety 11/02/2016   prior to husbands death    Dyspnea    Gross hematuria    developed after first hip replacment, led to indicental finding of bladder tumor, bleeding now resolved    H/O total shoulder replacement, left    History of kidney stones    1970's   Hyperlipidemia    Left hand weakness    due to reinjury of flxor tendon s/p tendon surgery march 12-2018    Lumbar stenosis    Malignant tumor of urinary bladder (HCC) 07/2016   Pre-stage I   OA (osteoarthritis)    Parkinson disease [facility]) dx 2012   neurologist-  dr siddiqui at [facility]   Psoriatic arthritis [facility])     Dr Ishmael , [place] [facility]    Rupture of flexor tendon of hand 2020   right   Past Surgical History:  Procedure Laterality Date   BIOPSY MASS LEFT FIRST METACARPAL   06/23/2006   benign   CATARACT EXTRACTION W/ INTRAOCULAR LENS  IMPLANT, BILATERAL  1999 and 2003   CERVICAL FUSION  02/2017   c1-c2 ; reports she has 2 screws 2in long in place done at [facility] ; patient exhibits VERY LIMITED NECK ROM   COLONOSCOPY     COSMETIC SURGERY     CYSTOSCOPY W/ RETROGRADES Bilateral 09/22/2016   Procedure: CYSTOSCOPY WITH RETROGRADE PYELOGRAM;  Surgeon: Ricardo Likens, MD;  Location: [facility];  Service: Urology;  Laterality: Bilateral;   D & C HYSTEROSCOPY W/ RESECTION POLYP  07/11/2000   DILATION AND CURETTAGE OF UTERUS  1960's   EXCISION CYST AND DEBRIDEMENT RIGHT WIRST AND REMOVAL FORGEIGN BODY  01/09/2009   EYE SURGERY     FLEXOR TENDON REPAIR Left 12/12/2018   Procedure: REPAIR/TRANSFER FLEXOR DIGITORUM PROFUNDUS OF LEFT SMALL FINGER;  Surgeon: Murrell Kuba, MD;  Location: [facility];  Service: Orthopedics;  Laterality: Left;   JOINT REPLACEMENT     LAPAROSCOPY  yrs ago   infertility    METACARPOPHALANGEAL JOINT ARTHRODESIS Right 05/25/1996   REVERSE SHOULDER ARTHROPLASTY Left 10/14/2022   Procedure: REVERSE SHOULDER ARTHROPLASTY;  Surgeon: Melita Drivers, MD;  Location: [facility];  Service: Orthopedics;  Laterality: Left;    SPINE SURGERY     TENDON REPAIR Left 01/15/2019   Hand   TONSILLECTOMY AND ADENOIDECTOMY  child   TOTAL HIP ARTHROPLASTY Left 07/07/2016   Procedure: LEFT TOTAL HIP ARTHROPLASTY ANTERIOR APPROACH;  Surgeon: Dempsey Moan, MD;  Location: [facility];  Service: Orthopedics;  Laterality: Left;   TOTAL HIP ARTHROPLASTY  Right 09/28/2017   Procedure: RIGHT TOTAL HIP ARTHROPLASTY ANTERIOR APPROACH;  Surgeon: Moan Dempsey, MD;  Location: [facility];  Service: Orthopedics;  Laterality: Right;   TOTAL KNEE ARTHROPLASTY Right 07/30/2019   Procedure: TOTAL KNEE ARTHROPLASTY;  Surgeon: Moan Dempsey, MD;  Location: [facility];  Service: Orthopedics;  Laterality: Right;    TRANSURETHRAL RESECTION OF BLADDER TUMOR N/A 09/22/2016   Procedure: TRANSURETHRAL RESECTION OF BLADDER TUMOR (TURBT);  Surgeon: Ricardo Likens, MD;  Location: [facility];  Service: Urology;  Laterality: N/A;   Patient Active Problem List   Diagnosis Date Noted   CAD (coronary artery disease), native coronary artery 04/20/2023   S/P reverse total shoulder arthroplasty, left 10/14/2022   Preoperative cardiovascular examination 10/04/2022   SOB (shortness of breath) 08/02/2022   Insomnia 08/02/2022   Hyponatremia 08/01/2022   Shoulder pain, left 09/14/2021   History of COVID-19 01/06/2021   Diarrhea 09/03/2020   Vitamin D  deficiency 09/03/2020   OA (osteoarthritis) of knee 07/30/2019   Tinea corporis 06/08/2019   Psoriatic arthritis (HCC) 04/02/2019   Anemia 03/13/2018   Hot flashes 03/13/2018   S/P cervical spinal fusion 03/31/2017   Neck pain 12/12/2016   Depression with anxiety 10/17/2016   Bladder cancer (HCC) 10/17/2016   OA (osteoarthritis) of hip 07/07/2016   Spinal stenosis of lumbar region with radiculopathy 04/08/2016   Spondylolisthesis of lumbar region 04/08/2016   Spondylosis of cervical region without myelopathy or radiculopathy 04/08/2016   Preventative health care 01/18/2016   Low back pain 01/18/2016   History of chicken pox    Hyperlipidemia 12/15/2014   Allergic rhinitis 06/25/2014   Allergy to bee sting 06/10/2014   TMJ tenderness 11/16/2013   Other malaise and fatigue 05/07/2013   Arthropathy of cervical spine (HCC) 11/27/2012   Muscle spasms of neck 11/27/2012   Parkinson's disease (HCC)  05/24/2012   Epiretinal membrane 03/09/2012   Hyperopia with astigmatism and presbyopia 03/09/2012   Pseudophakia 03/09/2012   HTN (hypertension) 08/31/2011   Right knee pain 08/31/2011   Benign hypertensive heart disease without heart failure 05/12/2011    PCP: Domenica Harlene LABOR, MD   REFERRING PROVIDER: Rosalia Hollering, MD   REFERRING DIAG: G20.A1 Parkinson's disease w/o dyskinesia or fluctuating manifestations  THERAPY DIAG:  Other abnormalities of gait and mobility  Unsteadiness on feet  Abnormal posture  Cervicalgia  Pain in thoracic spine  RATIONALE FOR EVALUATION AND TREATMENT: Rehabilitation  ONSET DATE: Diagnosed with PD in ~2012  NEXT MD VISIT: 11/23/2023 - for Botox injections in neck and back   SUBJECTIVE:  SUBJECTIVE STATEMENT: Pt reports Dr. Rosalia gave her the TP injections a little higher in her neck yesterday.  She notes her neck was not too back last night but is sore today - not sure if it from the injections or the impending weather front.   EVAL: Pt reports her neurologist has changed her PD meds from Sinemet  to Rytary  but they are still working on the right dosage as the current dosage leads to increased dyskinesia.  She notes more stiffness from the PD.  She notes much of her difficulty also seems to stem from her chronic neck and back pain.  She got some relief from Botox injections at 5 different places in her neck in Oct 2024 but she feels like the effects are starting to fade.  She notes more difficulty with walking related to the pain in her neck and pain - feels like she cannot stand up straight.  Pain keeps her from going to church or walking for exercise.  PAIN: Are you having pain? Yes: NPRS scale: 4-5/10 Pain location: L neck/thoracic spine &  upper shoulder into L UE and periscapular area Pain description: constant sharp ache Aggravating factors: sitting up straight, standing and walking Relieving factors: sitting in recliner, heat & vibration, ice, Meloxicam , Tylenol   PERTINENT HISTORY:  Chronic neck pain, L shoulder advanced OA s/p reverse TSA on 10/14/22; R TKA 07/2019, B THR 2018 & 2017, posterior C1-2 cervical fusion 2018, rupture of flexor tendon of L hand with failed surgical repair 2020, Parkinson's, psoriatic arthritis, bladder cancer, HTN    PRECAUTIONS: Fall  RED FLAGS: None  WEIGHT BEARING RESTRICTIONS: No  FALLS:  Has patient fallen in last 6 months? No  LIVING ENVIRONMENT: Lives with: lives alone Lives in: House/apartment Stairs: Yes: External: 4 steps; on right going up, on left going up, and can reach both Has following equipment at home: Single point cane, Walker - 2 wheeled, Tour manager, bed side commode, and Grab bars  OCCUPATION: Retired  PLOF: Independent and Leisure: used to agricultural consultant at [employer], bible study, and walk 1/3 mile daily but recently unable    PATIENT GOALS: Be able to walk (for exercise) again and go to church.   OBJECTIVE: (objective measures completed at initial evaluation unless otherwise dated)  DIAGNOSTIC FINDINGS:  N/A  COGNITION: Overall cognitive status: Within functional limits for tasks assessed and feels like her memory is not as good as it once was   SENSATION: WFL  COORDINATION: Heel/toe WNL. Heel to shin limited more but muscle tightness/stiffness from prior joint replacements.  MUSCLE LENGTH:  TBA as indicated Hamstrings:  ITB:  Piriformis:  Hip flexors:  Quads:  Heelcord:   POSTURE:  rounded shoulders, forward head, weight shift left, and head rests in 25 flexion & 8 R lateral flexion  CERVICAL ROM:  Head rests in 25 flexion & 8 R lateral flexion (10/18/23)  Active ROM Eval  Flexion 58  Extension 10  Right lateral flexion 20  Left lateral  flexion 5  Right rotation 40  Left rotation 22   (Blank rows = not tested)  UPPER EXTREMITY ROM:  Active ROM Right eval Left eval  Shoulder flexion 140 119  Shoulder extension    Shoulder abduction 127 105  Shoulder adduction    Shoulder internal rotation    Shoulder external rotation    Elbow flexion    Elbow extension    Wrist flexion    Wrist extension    Wrist ulnar deviation    Wrist  radial deviation    Wrist pronation    Wrist supination     (Blank rows = not tested)  UPPER EXTREMITY MMT:  MMT Right eval Left eval  Shoulder flexion 5 5  Shoulder extension 4 4+  Shoulder abduction 5 4+  Shoulder adduction    Shoulder internal rotation 4+ 4  Shoulder external rotation 4 4  Middle trapezius    Lower trapezius     (Blank rows = not tested)   LOWER EXTREMITY ROM:    Limited B hip IR/ER but otherwise WFL  LOWER EXTREMITY MMT:  Tested in sitting on eval  MMT Right eval Left eval  Hip flexion 4 4  Hip extension 4 4  Hip abduction 4+ 4+  Hip adduction 4+ 4+  Hip internal rotation 4+ 4+  Hip external rotation 4 4  Knee flexion 5 5  Knee extension 5 5  Ankle dorsiflexion 4+ 4+  Ankle plantarflexion    Ankle inversion    Ankle eversion    (Blank rows = not tested)  BED MOBILITY:  NT  TRANSFERS: Assistive device utilized: None  Sit to stand: Complete Independence Stand to sit: Complete Independence Chair to chair: Complete Independence Floor:  NT  GAIT: Distance walked: Clinic distances Assistive device utilized: None Level of assistance: Complete Independence Gait pattern: step through pattern, decreased arm swing- Right, decreased arm swing- Left, lateral lean- Right, and decreased trunk rotation Comment: Gait speed = 2.97 ft/sec  RAMP: Level of Assistance:  NT Assistive device utilized: None Ramp Comments:   CURB:  Level of Assistance:  NT Assistive device utilized: None Curb Comments:   STAIRS:  Level of Assistance:     Warden/ranger Technique: Alternating Pattern  with Single Rail on Right  Number of Stairs:    Height of Stairs:   Comments:   FUNCTIONAL TESTS:  5 times sit to stand: 11.97 sec Timed up and go (TUG): Normal - 10.85 sec, Manual - 11.22 sec, Cognitive - 12.47 sec 10 meter walk test: 11.03 sec; Gait speed = 2.97 ft/sec  Berg Balance Scale: 46/56, 46-51 moderate fall risk (>50%)  (11/03/23) Functional gait assessment: 15/30, < 19 = high risk fall (11/03/23)  Baselines as of D/C from last PT episode for Parkinson's (09/2022): 5 times sit to stand: 12.31 sec Berg Balance Scale: 54/56 TUG, & FGA not assessed due to patient unable to return for final visit  Baselines as of D/C from PT (02/2022):  Timed up and go (TUG): 8.88 sec (Normal) 10 meter walk test: 8.82 sec; Gait speed: 3.72 ft/sec  Functional gait assessment: 26/30  PATIENT SURVEYS:  ABC scale 1030 / 1600 = 64.4 %   TODAY'S TREATMENT:   11/24/23 THERAPEUTIC EXERCISE: to improve flexibility, strength and mobility.  Demonstration, verbal and tactile cues throughout for technique.  NuStep - L4 x 6 min (LE only)  NEUROMUSCULAR RE-EDUCATION: To improve posture, balance, proprioception, coordination, reduce fall risk, amplitude of movement, and reduce rigidity. SLS + opp LE 5-way to tap to colored dots x 5 revolutions SLS + opp LE cone knock-down and righting x 6 cones with side-stepping btw cones Alt fwd step with contralateral reach to cone atop vertical 36 FR x 10 Static tandem stance 2 x 30 - intermittent UE support on counter Tandem gait 10 ft along counter (intermittent UE support on counter) - Fwd 4 x 10 ft, Retro 2 x 10 ft Fwd PWR! Up Step x 10 alternating LE - pt positioned btw 2 chairs (  make-shift parallel bars) for safety but not needing UE support   11/21/23 THERAPEUTIC EXERCISE: to improve flexibility, strength and mobility.  Demonstration, verbal and tactile cues throughout for technique.  NuStep - L5 x 6 min (L  UE & B LE to promote reciprocal motion) R/L lunge with UE support on back of chair x 10 each - cues to avoid knees fwd of toes Standing alt hip extension with looped RTB at ankles x 10 bil Standing alt hip flexion march with looped RTB at midfeet x 10 bil Seated B hip ER clam with looped RTB at knees x 10  NEUROMUSCULAR RE-EDUCATION: To improve posture, balance, coordination, reduce fall risk, amplitude of movement, speed of movement to reduce bradykinesia, and reduce rigidity.  Standing PWR! Moves - Up, Rock, Twist, and Step x 10 Supine PWR! Moves - Up, Rock, Twist and Step x 10   11/17/23  THERAPEUTIC EXERCISE: to improve flexibility, strength and mobility.  Demonstration, verbal and tactile cues throughout for technique.  NuStep - L5 x 5 min - L UE & B LE initially to promote reciprocal motion (R UE deferred due to tendon injuries in wrist), however reduced to LE only due to c/o increased neck and upper shoulder pain  NEUROMUSCULAR RE-EDUCATION: To improve posture, balance, coordination, reduce fall risk, amplitude of movement, speed of movement to reduce bradykinesia, and reduce rigidity.  Standing PWR! Moves - Up, Rock, Twist, and Step x 10 Supine PWR! Moves - Up, Rock, and Step x 10;  Twist x 5 (stopped d/t increased neck pain)  MANUAL THERAPY: To promote normalized muscle tension, improved flexibility, improved joint mobility, increased ROM, and reduced pain.  STM/DTM and manual TPR to L UT, LS and scalenes   PATIENT EDUCATION:  Education details: continue with current HEP  Person educated: Patient Education method: Explanation Education comprehension: verbalized understanding  HOME EXERCISE PROGRAM: Access Code: [email or web address] URL: [email or web address] Date: 11/21/2023 Prepared by: Elijah Hidden  Exercises - Seated Hamstring Stretch  - 2 x daily - 7 x weekly - 3 reps - 30 sec hold - Seated Hip Abduction with Resistance  - 1 x daily - 3-4 x weekly - 2 sets - 10 reps  - 3-5 sec hold - Seated March with Resistance  - 1 x daily - 3-4 x weekly - 2 sets - 10 reps - 3 sec hold - Sit to Stand with Resistance Around Legs  - 1 x daily - 3-4 x weekly - 2 sets - 10 reps - Seated Shoulder Setting  - 1 x daily - 7 x weekly - 2 sets - 10 reps - 3 sec hold - Standing Bilateral Low Shoulder Row with Anchored Resistance  - 1 x daily - 3-4 x weekly - 2 sets - 10 reps - 5 sec hold - Scapular Retraction with Resistance Advanced  - 1 x daily - 3-4 x weekly - 2 sets - 10 reps - 5 sec hold - Marching with Resistance  - 1 x daily - 3 x weekly - 2 sets - 10 reps - 3 sec hold - Standing Hip Extension with Resistance at Ankles and Counter Support  - 1 x daily - 3 x weekly - 2 sets - 10 reps - 3 sec hold  PWR! Moves: - Sitting - Standing - Supine   ASSESSMENT:  CLINICAL IMPRESSION: Jalie reports continued soreness in her neck following her Botox injections yesterday but was told by the MD that it would take a few days for the Botox to  take effect.  Today's session focused more on balance and dynamic stepping to address balance related deficits and avoid further irritation of neck pain.  Intermittent UE support necessary during tandem activities with SBA/CGA of PT provided during all balance activities.  Alynn fatigues quickly with standing tasks, requiring frequent seated rest breaks.  Chelse will benefit from continued skilled PT to address ongoing deficits to improve mobility and activity tolerance with decreased pain interference.   OBJECTIVE IMPAIRMENTS: Abnormal gait, cardiopulmonary status limiting activity, decreased activity tolerance, decreased balance, decreased coordination, decreased endurance, decreased knowledge of condition, decreased mobility, difficulty walking, decreased ROM, decreased strength, hypomobility, increased fascial restrictions, impaired perceived functional ability, increased muscle spasms, impaired flexibility, improper body mechanics, postural  dysfunction, and pain.   ACTIVITY LIMITATIONS: carrying, lifting, bending, sitting, standing, squatting, sleeping, stairs, reach over head, and locomotion level  PARTICIPATION LIMITATIONS: meal prep, cleaning, laundry, driving, shopping, community activity, and church  PERSONAL FACTORS: Age, Past/current experiences, Time since onset of injury/illness/exacerbation, and 3+ comorbidities: Chronic neck pain, L shoulder advanced OA s/p reverse TSA on 10/14/22; R TKA 07/2019, B THR 2018 & 2017, posterior C1-2 cervical fusion 2018, rupture of flexor tendon of L hand with failed surgical repair 2020, Parkinson's, psoriatic arthritis, bladder cancer, HTN  are also affecting patient's functional outcome.   REHAB POTENTIAL: Good  CLINICAL DECISION MAKING: Evolving/moderate complexity  EVALUATION COMPLEXITY: Moderate   GOALS: Goals reviewed with patient? Yes  SHORT TERM GOALS: Target date: 11/15/2023  Complete assessment of Berg and FGA and update goals and POC accordingly. Baseline:  Goal status: MET - 11/03/23  2.  Patient will be independent with initial HEP. Baseline:  Goal status: MET - 11/21/23   LONG TERM GOALS: Target date: 12/13/2023  Patient will be independent with ongoing/advanced HEP for self-management at home incorporating PWR! Moves as indicated .  Baseline:  Goal status: IN PROGRESS  2.  Patient will be able to ambulate 600' w/o AD with good safety to access community.  Baseline:  Goal status: IN PROGRESS  3.  Patient will be able to step up/down curb safely with LRAD for safety with community ambulation.  Baseline:  Goal status: IN PROGRESS   4.  Patient will report 25-50% reduction in L-sided neck/thoracic, scapular and shoulder pain to improve activity tolerance for mobility and ambulation.  Baseline: Goal status: IN PROGRESS  5.  Patient will demonstrate gait speed of >/= 3.5 ft/sec to be a safe community ambulator with decreased risk for recurrent falls.   Baseline: 2.97 ft/sec Goal status: IN PROGRESS  6.  Patient will demonstrate at least 24/30 on FGA to improve gait stability and reduce risk for falls. (MCID = 4 points) Baseline: 15/30 (11/03/23) Goal status: IN PROGRESS  7.  Patient will improve Berg score to >/= 52/56 to improve safety and stability with ADLs in standing and reduce risk for falls. (MCID = 8 points)   Baseline: 46/56 (11/03/23) Goal status: IN PROGRESS  8.  Patient will report >/= 80% on ABC scale to demonstrate improved balance confidence and decreased risk for falls. Baseline: 64.4% Goal status: IN PROGRESS  9. Patient will verbalize understanding of local Parkinson's disease community resources, including community fitness post d/c. Baseline:  Goal status: IN PROGRESS   PLAN:  PT FREQUENCY: 2x/week  PT DURATION: 8 weeks  PLANNED INTERVENTIONS: 97164- PT Re-evaluation, 97110-Therapeutic exercises, 97530- Therapeutic activity, 97112- Neuromuscular re-education, 97535- Self Care, [id number]- Manual therapy, [id number]- Gait training, 97014- Electrical stimulation (unattended), [id number]- Electrical stimulation (manual), [id number]- Ultrasound,  [id number]- Ionotophoresis 4mg /ml Dexamethasone , Patient/Family education, Balance training, Stair training, Taping, Dry Needling, Joint mobilization, Spinal mobilization, Cryotherapy, and Moist heat  PLAN FOR NEXT SESSION: Review and progression of PWR! Moves as tolerated; progress balance and dynamic stepping activities; core/LE and postural strengthening; MT +/- DN to address abnormal muscle tension and TPs   Elijah CHRISTELLA Hidden, PT 11/24/2023, 5:08 PM

## 2023-11-27 ENCOUNTER — Other Ambulatory Visit: Payer: Self-pay | Admitting: Family Medicine

## 2023-11-28 DIAGNOSIS — I739 Peripheral vascular disease, unspecified: Secondary | ICD-10-CM | POA: Diagnosis not present

## 2023-11-28 DIAGNOSIS — L84 Corns and callosities: Secondary | ICD-10-CM | POA: Diagnosis not present

## 2023-11-28 DIAGNOSIS — L603 Nail dystrophy: Secondary | ICD-10-CM | POA: Diagnosis not present

## 2023-11-29 ENCOUNTER — Ambulatory Visit: Payer: Medicare Other | Admitting: Physical Therapy

## 2023-11-29 ENCOUNTER — Encounter: Payer: Self-pay | Admitting: Physical Therapy

## 2023-11-29 DIAGNOSIS — M542 Cervicalgia: Secondary | ICD-10-CM

## 2023-11-29 DIAGNOSIS — R2681 Unsteadiness on feet: Secondary | ICD-10-CM

## 2023-11-29 DIAGNOSIS — R293 Abnormal posture: Secondary | ICD-10-CM | POA: Diagnosis not present

## 2023-11-29 DIAGNOSIS — M546 Pain in thoracic spine: Secondary | ICD-10-CM

## 2023-11-29 DIAGNOSIS — R2689 Other abnormalities of gait and mobility: Secondary | ICD-10-CM | POA: Diagnosis not present

## 2023-11-29 NOTE — Therapy (Signed)
 OUTPATIENT PHYSICAL THERAPY TREATMENT   Patient Name: Stephanie [name] MRN: [id number] DOB:05/05/1932, 89 y.o., female Today's Date: 11/29/2023   END OF SESSION:  PT End of Session - 11/29/23 1400     Visit Number 7    Date for PT Re-Evaluation 12/13/23    Authorization Type Medicare & BCBS    PT Start Time 1400    PT Stop Time 1444    PT Time Calculation (min) 44 min    Activity Tolerance Patient tolerated treatment well    Behavior During Therapy WFL for tasks assessed/performed                Past Medical History:  Diagnosis Date   Allergy    Anemia    past hx    Arthropathy of cervical spine 11/27/2012   Right  Xray at [facility]  On 04/08/2016  AP, lateral, and lateral flexion and extension views of the cervical spine are submitted for evaluation.   No acute fracture identified in the cervical spine.   There is redemonstration of approximately 2 mm of C3 on C4 anterolisthesis in neutral which slightly increases in flexion relative to neutral and extension. Slight C5 on C6 retrolisthesis does not change i   Benign hypertension without congestive heart failure    Benign mole    right hcets mole, bleeds when dried with towel   Bilateral dry eyes    Bladder cancer (HCC) 10/30/16   CAD (coronary artery disease), native coronary artery 04/2023   Coronary CTA showed a coronary calcium  score to 46 with less than 25% left main, 25 to 49% proximal LAD, less than 25% left circumflex stenosis   Cataract    Cervical spondylosis    Cervicalgia    2 screws in place    Chronic kidney disease    Depression with anxiety 10-30-16   prior to husbands death    Dyspnea    Gross hematuria    developed after first hip replacment, led to indicental finding of bladder tumor, bleeding now resolved    H/O total shoulder replacement, left    History of kidney stones    1970's   Hyperlipidemia    Left hand weakness    due to reinjury of flxor tendon s/p tendon surgery march 12-2018    Lumbar stenosis    Malignant tumor of urinary bladder (HCC) 07/2016   Pre-stage I   OA (osteoarthritis)    Parkinson disease [facility]) dx 2012   neurologist-  dr siddiqui at [facility]   Psoriatic arthritis [facility])     Dr Ishmael , [place] [facility]    Rupture of flexor tendon of hand 2020   right   Past Surgical History:  Procedure Laterality Date   BIOPSY MASS LEFT FIRST METACARPAL   06/23/2006   benign   CATARACT EXTRACTION W/ INTRAOCULAR LENS  IMPLANT, BILATERAL  1999 and 2003   CERVICAL FUSION  02/2017   c1-c2 ; reports she has 2 screws 2in long in place done at [facility] ; patient exhibits VERY LIMITED NECK ROM   COLONOSCOPY     COSMETIC SURGERY     CYSTOSCOPY W/ RETROGRADES Bilateral 09/22/2016   Procedure: CYSTOSCOPY WITH RETROGRADE PYELOGRAM;  Surgeon: Ricardo Likens, MD;  Location: [facility];  Service: Urology;  Laterality: Bilateral;   D & C HYSTEROSCOPY W/ RESECTION POLYP  07/11/2000   DILATION AND CURETTAGE OF UTERUS  1960's   EXCISION CYST AND DEBRIDEMENT RIGHT WIRST AND REMOVAL FORGEIGN BODY  01/09/2009   EYE SURGERY     FLEXOR TENDON REPAIR Left 12/12/2018   Procedure: REPAIR/TRANSFER FLEXOR DIGITORUM PROFUNDUS OF LEFT SMALL FINGER;  Surgeon: Murrell Kuba, MD;  Location: [facility];  Service: Orthopedics;  Laterality: Left;   JOINT REPLACEMENT     LAPAROSCOPY  yrs ago   infertility    METACARPOPHALANGEAL JOINT ARTHRODESIS Right 05/25/1996   REVERSE SHOULDER ARTHROPLASTY Left 10/14/2022   Procedure: REVERSE SHOULDER ARTHROPLASTY;  Surgeon: Melita Drivers, MD;  Location: [facility];  Service: Orthopedics;  Laterality: Left;    SPINE SURGERY     TENDON REPAIR Left 01/15/2019   Hand   TONSILLECTOMY AND ADENOIDECTOMY  child   TOTAL HIP ARTHROPLASTY Left 07/07/2016   Procedure: LEFT TOTAL HIP ARTHROPLASTY ANTERIOR APPROACH;  Surgeon: Dempsey Moan, MD;  Location: [facility];  Service: Orthopedics;  Laterality: Left;   TOTAL HIP ARTHROPLASTY  Right 09/28/2017   Procedure: RIGHT TOTAL HIP ARTHROPLASTY ANTERIOR APPROACH;  Surgeon: Moan Dempsey, MD;  Location: [facility];  Service: Orthopedics;  Laterality: Right;   TOTAL KNEE ARTHROPLASTY Right 07/30/2019   Procedure: TOTAL KNEE ARTHROPLASTY;  Surgeon: Moan Dempsey, MD;  Location: [facility];  Service: Orthopedics;  Laterality: Right;    TRANSURETHRAL RESECTION OF BLADDER TUMOR N/A 09/22/2016   Procedure: TRANSURETHRAL RESECTION OF BLADDER TUMOR (TURBT);  Surgeon: Ricardo Likens, MD;  Location: [facility];  Service: Urology;  Laterality: N/A;   Patient Active Problem List   Diagnosis Date Noted   CAD (coronary artery disease), native coronary artery 04/20/2023   S/P reverse total shoulder arthroplasty, left 10/14/2022   Preoperative cardiovascular examination 10/04/2022   SOB (shortness of breath) 08/02/2022   Insomnia 08/02/2022   Hyponatremia 08/01/2022   Shoulder pain, left 09/14/2021   History of COVID-19 01/06/2021   Diarrhea 09/03/2020   Vitamin D  deficiency 09/03/2020   OA (osteoarthritis) of knee 07/30/2019   Tinea corporis 06/08/2019   Psoriatic arthritis (HCC) 04/02/2019   Anemia 03/13/2018   Hot flashes 03/13/2018   S/P cervical spinal fusion 03/31/2017   Neck pain 12/12/2016   Depression with anxiety 10/17/2016   Bladder cancer (HCC) 10/17/2016   OA (osteoarthritis) of hip 07/07/2016   Spinal stenosis of lumbar region with radiculopathy 04/08/2016   Spondylolisthesis of lumbar region 04/08/2016   Spondylosis of cervical region without myelopathy or radiculopathy 04/08/2016   Preventative health care 01/18/2016   Low back pain 01/18/2016   History of chicken pox    Hyperlipidemia 12/15/2014   Allergic rhinitis 06/25/2014   Allergy to bee sting 06/10/2014   TMJ tenderness 11/16/2013   Other malaise and fatigue 05/07/2013   Arthropathy of cervical spine (HCC) 11/27/2012   Muscle spasms of neck 11/27/2012   Parkinson's disease (HCC)  05/24/2012   Epiretinal membrane 03/09/2012   Hyperopia with astigmatism and presbyopia 03/09/2012   Pseudophakia 03/09/2012   HTN (hypertension) 08/31/2011   Right knee pain 08/31/2011   Benign hypertensive heart disease without heart failure 05/12/2011    PCP: Domenica Harlene LABOR, MD   REFERRING PROVIDER: Rosalia Hollering, MD   REFERRING DIAG: G20.A1 Parkinson's disease w/o dyskinesia or fluctuating manifestations  THERAPY DIAG:  Other abnormalities of gait and mobility  Unsteadiness on feet  Abnormal posture  Cervicalgia  Pain in thoracic spine  RATIONALE FOR EVALUATION AND TREATMENT: Rehabilitation  ONSET DATE: Diagnosed with PD in ~2012  NEXT MD VISIT: 11/23/2023 - for Botox injections in neck and back   SUBJECTIVE:  SUBJECTIVE STATEMENT: Pt reports her Botox injections seem to be helping and she was able to sleep better last night.   EVAL: Pt reports her neurologist has changed her PD meds from Sinemet  to Rytary  but they are still working on the right dosage as the current dosage leads to increased dyskinesia.  She notes more stiffness from the PD.  She notes much of her difficulty also seems to stem from her chronic neck and back pain.  She got some relief from Botox injections at 5 different places in her neck in Oct 2024 but she feels like the effects are starting to fade.  She notes more difficulty with walking related to the pain in her neck and pain - feels like she cannot stand up straight.  Pain keeps her from going to church or walking for exercise.  PAIN: Are you having pain? Yes: NPRS scale: 3-4/10 Pain location: L neck/thoracic spine & upper shoulder into L UE and periscapular area Pain description: constant sharp ache Aggravating factors: sitting up straight,  standing and walking Relieving factors: sitting in recliner, heat & vibration, ice, Meloxicam , Tylenol   PERTINENT HISTORY:  Chronic neck pain, L shoulder advanced OA s/p reverse TSA on 10/14/22; R TKA 07/2019, B THR 2018 & 2017, posterior C1-2 cervical fusion 2018, rupture of flexor tendon of L hand with failed surgical repair 2020, Parkinson's, psoriatic arthritis, bladder cancer, HTN    PRECAUTIONS: Fall  RED FLAGS: None  WEIGHT BEARING RESTRICTIONS: No  FALLS:  Has patient fallen in last 6 months? No  LIVING ENVIRONMENT: Lives with: lives alone Lives in: House/apartment Stairs: Yes: External: 4 steps; on right going up, on left going up, and can reach both Has following equipment at home: Single point cane, Walker - 2 wheeled, Tour manager, bed side commode, and Grab bars  OCCUPATION: Retired  PLOF: Independent and Leisure: used to agricultural consultant at [employer], bible study, and walk 1/3 mile daily but recently unable    PATIENT GOALS: Be able to walk (for exercise) again and go to church.   OBJECTIVE: (objective measures completed at initial evaluation unless otherwise dated)  DIAGNOSTIC FINDINGS:  N/A  COGNITION: Overall cognitive status: Within functional limits for tasks assessed and feels like her memory is not as good as it once was   SENSATION: WFL  COORDINATION: Heel/toe WNL. Heel to shin limited more but muscle tightness/stiffness from prior joint replacements.  MUSCLE LENGTH:  TBA as indicated Hamstrings:  ITB:  Piriformis:  Hip flexors:  Quads:  Heelcord:   POSTURE:  rounded shoulders, forward head, weight shift left, and head rests in 25 flexion & 8 R lateral flexion  CERVICAL ROM:  Head rests in 25 flexion & 8 R lateral flexion (10/18/23)  Active ROM Eval  Flexion 58  Extension 10  Right lateral flexion 20  Left lateral flexion 5  Right rotation 40  Left rotation 22   (Blank rows = not tested)  UPPER EXTREMITY ROM:  Active ROM Right eval  Left eval  Shoulder flexion 140 119  Shoulder extension    Shoulder abduction 127 105  Shoulder adduction    Shoulder internal rotation    Shoulder external rotation    Elbow flexion    Elbow extension    Wrist flexion    Wrist extension    Wrist ulnar deviation    Wrist radial deviation    Wrist pronation    Wrist supination     (Blank rows = not tested)  UPPER EXTREMITY MMT:  MMT Right eval Left eval  Shoulder flexion 5 5  Shoulder extension 4 4+  Shoulder abduction 5 4+  Shoulder adduction    Shoulder internal rotation 4+ 4  Shoulder external rotation 4 4  Middle trapezius    Lower trapezius     (Blank rows = not tested)   LOWER EXTREMITY ROM:    Limited B hip IR/ER but otherwise WFL  LOWER EXTREMITY MMT:  Tested in sitting on eval  MMT Right eval Left eval  Hip flexion 4 4  Hip extension 4 4  Hip abduction 4+ 4+  Hip adduction 4+ 4+  Hip internal rotation 4+ 4+  Hip external rotation 4 4  Knee flexion 5 5  Knee extension 5 5  Ankle dorsiflexion 4+ 4+  Ankle plantarflexion    Ankle inversion    Ankle eversion    (Blank rows = not tested)  BED MOBILITY:  NT  TRANSFERS: Assistive device utilized: None  Sit to stand: Complete Independence Stand to sit: Complete Independence Chair to chair: Complete Independence Floor:  NT  GAIT: Distance walked: Clinic distances Assistive device utilized: None Level of assistance: Complete Independence Gait pattern: step through pattern, decreased arm swing- Right, decreased arm swing- Left, lateral lean- Right, and decreased trunk rotation Comment: Gait speed = 2.97 ft/sec  RAMP: Level of Assistance:  NT Assistive device utilized: None Ramp Comments:   CURB:  Level of Assistance:  NT Assistive device utilized: None Curb Comments:   STAIRS:  Level of Assistance:     Psychologist, Counselling Technique: Alternating Pattern  with Single Rail on Right  Number of Stairs:    Height of Stairs:   Comments:    FUNCTIONAL TESTS:  5 times sit to stand: 11.97 sec Timed up and go (TUG): Normal - 10.85 sec, Manual - 11.22 sec, Cognitive - 12.47 sec 10 meter walk test: 11.03 sec; Gait speed = 2.97 ft/sec  Berg Balance Scale: 46/56, 46-51 moderate fall risk (>50%)  (11/03/23) Functional gait assessment: 15/30, < 19 = high risk fall (11/03/23)  Baselines as of D/C from last PT episode for Parkinson's (09/2022): 5 times sit to stand: 12.31 sec Berg Balance Scale: 54/56 TUG, & FGA not assessed due to patient unable to return for final visit  Baselines as of D/C from PT (02/2022):  Timed up and go (TUG): 8.88 sec (Normal) 10 meter walk test: 8.82 sec; Gait speed: 3.72 ft/sec  Functional gait assessment: 26/30  PATIENT SURVEYS:  ABC scale 1030 / 1600 = 64.4 %   TODAY'S TREATMENT:   11/29/23 THERAPEUTIC EXERCISE: to improve flexibility, strength and mobility.  Demonstration, verbal and tactile cues throughout for technique.  NuStep - L5 x 6 min (LE only)  NEUROMUSCULAR RE-EDUCATION: To improve posture, balance, proprioception, coordination, reduce fall risk, amplitude of movement, speed of movement to reduce bradykinesia, and reduce rigidity. Corner balance progression with tandem stance on firm surface with arms wide, arms at sides, and arms crossed on chest            Eyes open: - static stance 3 x 30 sec bil - horiz head turns x 5 Alt fwd step with contralateral reach to cone atop vertical 36 FR x 10 Fwd PWR! Up Step x 10 alternating LE - pt positioned btw 2 chairs (make-shift parallel bars) for safety but not needing UE support Backward PWR! Up Step x 10 alternating LE - pt positioned btw 2 chairs (make-shift parallel bars) - chairs used for UE support initially, but  gradually shifting to B fwd reach Step-through fwd & back x 10 each side, initially with B UE support on back of 2 chairs, progressing to contralateral UE reciprocal arm swing Step-through fwd & back x 5 each side, hands  back with fwd swing/step of foot and hands fwd with backward swing/step   11/24/23 THERAPEUTIC EXERCISE: to improve flexibility, strength and mobility.  Demonstration, verbal and tactile cues throughout for technique.  NuStep - L4 x 6 min (LE only)  NEUROMUSCULAR RE-EDUCATION: To improve posture, balance, proprioception, coordination, reduce fall risk, amplitude of movement, and reduce rigidity. SLS + opp LE 5-way to tap to colored dots x 5 revolutions SLS + opp LE cone knock-down and righting x 6 cones with side-stepping btw cones Alt fwd step with contralateral reach to cone atop vertical 36 FR x 10 Static tandem stance 2 x 30 - intermittent UE support on counter Tandem gait 10 ft along counter (intermittent UE support on counter) - Fwd 4 x 10 ft, Retro 2 x 10 ft Fwd PWR! Up Step x 10 alternating LE - pt positioned btw 2 chairs (make-shift parallel bars) for safety but not needing UE support   11/21/23 THERAPEUTIC EXERCISE: to improve flexibility, strength and mobility.  Demonstration, verbal and tactile cues throughout for technique.  NuStep - L5 x 6 min (L UE & B LE to promote reciprocal motion) R/L lunge with UE support on back of chair x 10 each - cues to avoid knees fwd of toes Standing alt hip extension with looped RTB at ankles x 10 bil Standing alt hip flexion march with looped RTB at midfeet x 10 bil Seated B hip ER clam with looped RTB at knees x 10  NEUROMUSCULAR RE-EDUCATION: To improve posture, balance, coordination, reduce fall risk, amplitude of movement, speed of movement to reduce bradykinesia, and reduce rigidity.  Standing PWR! Moves - Up, Rock, Twist, and Step x 10 Supine PWR! Moves - Up, [employer], [employer] and Step x 10   PATIENT EDUCATION:  Education details: continue with current HEP  Person educated: Patient Education method: Explanation Education comprehension: verbalized understanding  HOME EXERCISE PROGRAM: Access Code: [email or web address] URL:  [email or web address] Date: 11/29/2023 Prepared by: Elijah Hidden  Exercises - Seated Hamstring Stretch  - 2 x daily - 7 x weekly - 3 reps - 30 sec hold - Seated Hip Abduction with Resistance  - 1 x daily - 3-4 x weekly - 2 sets - 10 reps - 3-5 sec hold - Seated March with Resistance  - 1 x daily - 3-4 x weekly - 2 sets - 10 reps - 3 sec hold - Sit to Stand with Resistance Around Legs  - 1 x daily - 3-4 x weekly - 2 sets - 10 reps - Seated Shoulder Setting  - 1 x daily - 7 x weekly - 2 sets - 10 reps - 3 sec hold - Standing Bilateral Low Shoulder Row with Anchored Resistance  - 1 x daily - 3-4 x weekly - 2 sets - 10 reps - 5 sec hold - Scapular Retraction with Resistance Advanced  - 1 x daily - 3-4 x weekly - 2 sets - 10 reps - 5 sec hold - Marching with Resistance  - 1 x daily - 3 x weekly - 2 sets - 10 reps - 3 sec hold - Standing Hip Extension with Resistance at Ankles and Counter Support  - 1 x daily - 3 x weekly - 2 sets - 10 reps - 3 sec hold -  Semi-Tandem Corner Balance With Eyes Open  - 1 x daily - 3 x weekly - 2 sets - 10 reps - 3 sec hold - Semi-Tandem Corner Balance: Eyes Open With Head Turns  - 1 x daily - 3 x weekly - 2 sets - 5 reps  PWR! Moves: - Sitting - Standing - Supine   ASSESSMENT:  CLINICAL IMPRESSION: Jetta reports she feels very challenged with tandem activities when she attempts them at home, therefore provided guidance on safer home performance using corner balance set-up along with guidance on how to self-progress corner balance progression.  We continued progression of dynamic stepping activities using PWR! patterns to promote improved posture, balance, coordination, reciprocal movement patterns, and amplitude of movement to reduce fall risk and rigidity associated with her PD.  Intermittent UE support necessary upon initiation of new activities however patient typically able to progress to w/o UE support, although with SBA/CGA of PT still provided  during all balance activities.  Merrillyn continues to fatigue quickly with standing tasks, requiring frequent seated rest breaks.  Zikeria will benefit from continued skilled PT to address ongoing deficits to improve mobility and activity tolerance with decreased pain interference.   OBJECTIVE IMPAIRMENTS: Abnormal gait, cardiopulmonary status limiting activity, decreased activity tolerance, decreased balance, decreased coordination, decreased endurance, decreased knowledge of condition, decreased mobility, difficulty walking, decreased ROM, decreased strength, hypomobility, increased fascial restrictions, impaired perceived functional ability, increased muscle spasms, impaired flexibility, improper body mechanics, postural dysfunction, and pain.   ACTIVITY LIMITATIONS: carrying, lifting, bending, sitting, standing, squatting, sleeping, stairs, reach over head, and locomotion level  PARTICIPATION LIMITATIONS: meal prep, cleaning, laundry, driving, shopping, community activity, and church  PERSONAL FACTORS: Age, Past/current experiences, Time since onset of injury/illness/exacerbation, and 3+ comorbidities: Chronic neck pain, L shoulder advanced OA s/p reverse TSA on 10/14/22; R TKA 07/2019, B THR 2018 & 2017, posterior C1-2 cervical fusion 2018, rupture of flexor tendon of L hand with failed surgical repair 2020, Parkinson's, psoriatic arthritis, bladder cancer, HTN  are also affecting patient's functional outcome.   REHAB POTENTIAL: Good  CLINICAL DECISION MAKING: Evolving/moderate complexity  EVALUATION COMPLEXITY: Moderate   GOALS: Goals reviewed with patient? Yes  SHORT TERM GOALS: Target date: 11/15/2023  Complete assessment of Berg and FGA and update goals and POC accordingly. Baseline:  Goal status: MET - 11/03/23  2.  Patient will be independent with initial HEP. Baseline:  Goal status: MET - 11/21/23   LONG TERM GOALS: Target date: 12/13/2023  Patient will be independent with  ongoing/advanced HEP for self-management at home incorporating PWR! Moves as indicated .  Baseline:  Goal status: IN PROGRESS  2.  Patient will be able to ambulate 600' w/o AD with good safety to access community.  Baseline:  Goal status: IN PROGRESS  3.  Patient will be able to step up/down curb safely with LRAD for safety with community ambulation.  Baseline:  Goal status: IN PROGRESS   4.  Patient will report 25-50% reduction in L-sided neck/thoracic, scapular and shoulder pain to improve activity tolerance for mobility and ambulation.  Baseline: Goal status: IN PROGRESS  5.  Patient will demonstrate gait speed of >/= 3.5 ft/sec to be a safe community ambulator with decreased risk for recurrent falls.  Baseline: 2.97 ft/sec Goal status: IN PROGRESS  6.  Patient will demonstrate at least 24/30 on FGA to improve gait stability and reduce risk for falls. (MCID = 4 points) Baseline: 15/30 (11/03/23) Goal status: IN PROGRESS  7.  Patient will improve Lars  score to >/= 52/56 to improve safety and stability with ADLs in standing and reduce risk for falls. (MCID = 8 points)   Baseline: 46/56 (11/03/23) Goal status: IN PROGRESS  8.  Patient will report >/= 80% on ABC scale to demonstrate improved balance confidence and decreased risk for falls. Baseline: 64.4% Goal status: IN PROGRESS  9. Patient will verbalize understanding of local Parkinson's disease community resources, including community fitness post d/c. Baseline:  Goal status: IN PROGRESS   PLAN:  PT FREQUENCY: 2x/week  PT DURATION: 8 weeks  PLANNED INTERVENTIONS: 97164- PT Re-evaluation, 97110-Therapeutic exercises, 97530- Therapeutic activity, [id number]- Neuromuscular re-education, 97535- Self Care, [id number]- Manual therapy, [id number]- Gait training, 97014- Electrical stimulation (unattended), [id number]- Electrical stimulation (manual), [id number]- Ultrasound, [id number]- Ionotophoresis 4mg /ml Dexamethasone , Patient/Family education, Balance  training, Stair training, Taping, Dry Needling, Joint mobilization, Spinal mobilization, Cryotherapy, and Moist heat  PLAN FOR NEXT SESSION: Initiate reassessment of standardized balance testing and LTG assessment; review and progression of PWR! Moves/patterns as tolerated; progress balance and dynamic stepping activities; core/LE and postural strengthening; MT +/- DN to address abnormal muscle tension and TPs   Elijah CHRISTELLA Hidden, PT 11/29/2023, 4:02 PM

## 2023-12-05 ENCOUNTER — Encounter: Payer: Self-pay | Admitting: Physical Therapy

## 2023-12-05 ENCOUNTER — Ambulatory Visit: Payer: Medicare Other | Admitting: Physical Therapy

## 2023-12-05 DIAGNOSIS — R293 Abnormal posture: Secondary | ICD-10-CM | POA: Diagnosis not present

## 2023-12-05 DIAGNOSIS — R2681 Unsteadiness on feet: Secondary | ICD-10-CM

## 2023-12-05 DIAGNOSIS — M546 Pain in thoracic spine: Secondary | ICD-10-CM

## 2023-12-05 DIAGNOSIS — R2689 Other abnormalities of gait and mobility: Secondary | ICD-10-CM

## 2023-12-05 DIAGNOSIS — M542 Cervicalgia: Secondary | ICD-10-CM | POA: Diagnosis not present

## 2023-12-05 NOTE — Therapy (Signed)
OUTPATIENT PHYSICAL THERAPY TREATMENT   Patient Name: Stephanie [name] MRN: [id number] DOB:11-25-1931, 89 y.o., female Today's Date: 12/05/2023   END OF SESSION:  PT End of Session - 12/05/23 1449     Visit Number 8    Date for PT Re-Evaluation 12/13/23    Authorization Type Medicare & BCBS    PT Start Time 1449    PT Stop Time 1529    PT Time Calculation (min) 40 min    Activity Tolerance Patient tolerated treatment well    Behavior During Therapy [facility] for tasks assessed/performed                Past Medical History:  Diagnosis Date   Allergy    Anemia    past hx    Arthropathy of cervical spine 11/27/2012   Right  Xray at [facility]  On 04/08/2016  AP, lateral, and lateral flexion and extension views of the cervical spine are submitted for evaluation.   No acute fracture identified in the cervical spine.   There is redemonstration of approximately 2 mm of C3 on C4 anterolisthesis in neutral which slightly increases in flexion relative to neutral and extension. Slight C5 on C6 retrolisthesis does not change i   Benign hypertension without congestive heart failure    Benign mole    right hcets mole, bleeds when dried with towel   Bilateral dry eyes    Bladder cancer (HCC) 08-Nov-2016   CAD (coronary artery disease), native coronary artery 04/2023   Coronary CTA showed a coronary calcium score to 46 with less than 25% left main, 25 to 49% proximal LAD, less than 25% left circumflex stenosis   Cataract    Cervical spondylosis    Cervicalgia    2 screws in place    Chronic kidney disease    Depression with anxiety 2016-11-08   prior to husbands death    Dyspnea    Gross hematuria    developed after first hip replacment, led to indicental finding of bladder tumor, bleeding now resolved    H/O total shoulder replacement, left    History of kidney stones    1970's   Hyperlipidemia    Left hand weakness    due to reinjury of flxor tendon s/p tendon surgery march 12-2018    Lumbar stenosis    Malignant tumor of urinary bladder (HCC) 07/2016   Pre-stage I   OA (osteoarthritis)    Parkinson disease [facility]) dx 2012   neurologist-  dr siddiqui at [facility]   Psoriatic arthritis [facility])     Dr Nickola Major , [place] [facility]    Rupture of flexor tendon of hand 2020   right   Past Surgical History:  Procedure Laterality Date   BIOPSY MASS LEFT FIRST METACARPAL   06/23/2006   benign   CATARACT EXTRACTION W/ INTRAOCULAR LENS  IMPLANT, BILATERAL  1999 and 2003   CERVICAL FUSION  02/2017   c1-c2 ; reports she has 2 screws 2in long in place done at [facility] ; patient exhibits VERY LIMITED NECK ROM   COLONOSCOPY     COSMETIC SURGERY     CYSTOSCOPY W/ RETROGRADES Bilateral 09/22/2016   Procedure: CYSTOSCOPY WITH RETROGRADE PYELOGRAM;  Surgeon: Sebastian Ache, MD;  Location: [facility];  Service: Urology;  Laterality: Bilateral;   D & C HYSTEROSCOPY W/ RESECTION POLYP  07/11/2000   DILATION AND CURETTAGE OF UTERUS  1960's   EXCISION CYST AND DEBRIDEMENT RIGHT WIRST AND REMOVAL FORGEIGN BODY  01/09/2009   EYE SURGERY     FLEXOR TENDON REPAIR Left 12/12/2018   Procedure: REPAIR/TRANSFER FLEXOR DIGITORUM PROFUNDUS OF LEFT SMALL FINGER;  Surgeon: Cindee Salt, MD;  Location: [facility];  Service: Orthopedics;  Laterality: Left;   JOINT REPLACEMENT     LAPAROSCOPY  yrs ago   infertility    METACARPOPHALANGEAL JOINT ARTHRODESIS Right 05/25/1996   REVERSE SHOULDER ARTHROPLASTY Left 10/14/2022   Procedure: REVERSE SHOULDER ARTHROPLASTY;  Surgeon: Francena Hanly, MD;  Location: [facility];  Service: Orthopedics;  Laterality: Left;    SPINE SURGERY     TENDON REPAIR Left 01/15/2019   Hand   TONSILLECTOMY AND ADENOIDECTOMY  child   TOTAL HIP ARTHROPLASTY Left 07/07/2016   Procedure: LEFT TOTAL HIP ARTHROPLASTY ANTERIOR APPROACH;  Surgeon: Ollen Gross, MD;  Location: [facility];  Service: Orthopedics;  Laterality: Left;   TOTAL HIP ARTHROPLASTY  Right 09/28/2017   Procedure: RIGHT TOTAL HIP ARTHROPLASTY ANTERIOR APPROACH;  Surgeon: Ollen Gross, MD;  Location: [facility];  Service: Orthopedics;  Laterality: Right;   TOTAL KNEE ARTHROPLASTY Right 07/30/2019   Procedure: TOTAL KNEE ARTHROPLASTY;  Surgeon: Ollen Gross, MD;  Location: [facility];  Service: Orthopedics;  Laterality: Right;    TRANSURETHRAL RESECTION OF BLADDER TUMOR N/A 09/22/2016   Procedure: TRANSURETHRAL RESECTION OF BLADDER TUMOR (TURBT);  Surgeon: Sebastian Ache, MD;  Location: [facility];  Service: Urology;  Laterality: N/A;   Patient Active Problem List   Diagnosis Date Noted   CAD (coronary artery disease), native coronary artery 04/20/2023   S/P reverse total shoulder arthroplasty, left 10/14/2022   Preoperative cardiovascular examination 10/04/2022   SOB (shortness of breath) 08/02/2022   Insomnia 08/02/2022   Hyponatremia 08/01/2022   Shoulder pain, left 09/14/2021   History of COVID-19 01/06/2021   Diarrhea 09/03/2020   Vitamin D deficiency 09/03/2020   OA (osteoarthritis) of knee 07/30/2019   Tinea corporis 06/08/2019   Psoriatic arthritis (HCC) 04/02/2019   Anemia 03/13/2018   Hot flashes 03/13/2018   S/P cervical spinal fusion 03/31/2017   Neck pain 12/12/2016   Depression with anxiety 10/17/2016   Bladder cancer (HCC) 10/17/2016   OA (osteoarthritis) of hip 07/07/2016   Spinal stenosis of lumbar region with radiculopathy 04/08/2016   Spondylolisthesis of lumbar region 04/08/2016   Spondylosis of cervical region without myelopathy or radiculopathy 04/08/2016   Preventative health care 01/18/2016   Low back pain 01/18/2016   History of chicken pox    Hyperlipidemia 12/15/2014   Allergic rhinitis 06/25/2014   Allergy to bee sting 06/10/2014   TMJ tenderness 11/16/2013   Other malaise and fatigue 05/07/2013   Arthropathy of cervical spine (HCC) 11/27/2012   Muscle spasms of neck 11/27/2012   Parkinson's disease (HCC)  05/24/2012   Epiretinal membrane 03/09/2012   Hyperopia with astigmatism and presbyopia 03/09/2012   Pseudophakia 03/09/2012   HTN (hypertension) 08/31/2011   Right knee pain 08/31/2011   Benign hypertensive heart disease without heart failure 05/12/2011    PCP: Bradd Canary, MD   REFERRING PROVIDER: Jaquita Folds, MD   REFERRING DIAG: G20.A1 Parkinson's disease w/o dyskinesia or fluctuating manifestations  THERAPY DIAG:  Other abnormalities of gait and mobility  Unsteadiness on feet  Abnormal posture  Cervicalgia  Pain in thoracic spine  RATIONALE FOR EVALUATION AND TREATMENT: Rehabilitation  ONSET DATE: Diagnosed with PD in ~2012  NEXT MD VISIT: 11/23/2023 - for Botox injections in neck and back   SUBJECTIVE:  SUBJECTIVE STATEMENT: Pt reports she has been sleeping better. She denies pain today. She feels like she is more stable with walking when she is bent forward.  EVAL: Pt reports her neurologist has changed her PD meds from Sinemet to Rytary but they are still working on the right dosage as the current dosage leads to increased dyskinesia.  She notes more stiffness from the PD.  She notes much of her difficulty also seems to stem from her chronic neck and back pain.  She got some relief from Botox injections at 5 different places in her neck in Oct 2024 but she feels like the effects are starting to fade.  She notes more difficulty with walking related to the pain in her neck and pain - feels like she cannot stand up straight.  Pain keeps her from going to church or walking for exercise.  PAIN: Are you having pain? No and Yes: NPRS scale: 0/10 Pain location: L neck/thoracic spine & upper shoulder into L UE and periscapular area Pain description: constant sharp  ache Aggravating factors: sitting up straight, standing and walking Relieving factors: sitting in recliner, heat & vibration, ice, Meloxicam, Tylenol  PERTINENT HISTORY:  Chronic neck pain, L shoulder advanced OA s/p reverse TSA on 10/14/22; R TKA 07/2019, B THR 2018 & 2017, posterior C1-2 cervical fusion 2018, rupture of flexor tendon of L hand with failed surgical repair 2020, Parkinson's, psoriatic arthritis, bladder cancer, HTN    PRECAUTIONS: Fall  RED FLAGS: None  WEIGHT BEARING RESTRICTIONS: No  FALLS:  Has patient fallen in last 6 months? No  LIVING ENVIRONMENT: Lives with: lives alone Lives in: House/apartment Stairs: Yes: External: 4 steps; on right going up, on left going up, and can reach both Has following equipment at home: Single point cane, Walker - 2 wheeled, Tour manager, bed side commode, and Grab bars  OCCUPATION: Retired  [employer]: Independent and Leisure: used to Agricultural consultant at [employer], bible study, and walk 1/3 mile daily but recently unable    PATIENT GOALS: "Be able to walk (for exercise) again and go to church."   OBJECTIVE: (objective measures completed at initial evaluation unless otherwise dated)  DIAGNOSTIC FINDINGS:  N/A  COGNITION: Overall cognitive status: Within functional limits for tasks assessed and feels like her memory is not as good as it once was   SENSATION: WFL  COORDINATION: Heel/toe WNL. Heel to shin limited more but muscle tightness/stiffness from prior joint replacements.  MUSCLE LENGTH:  TBA as indicated Hamstrings:  ITB:  Piriformis:  Hip flexors:  Quads:  Heelcord:   POSTURE:  rounded shoulders, forward head, weight shift left, and head rests in 25 flexion & 8 R lateral flexion  CERVICAL ROM:  Head rests in 25 flexion & 8 R lateral flexion (10/18/23)  Active ROM Eval  Flexion 58  Extension 10  Right lateral flexion 20  Left lateral flexion 5  Right rotation 40  Left rotation 22   (Blank rows = not  tested)  UPPER EXTREMITY ROM:  Active ROM Right eval Left eval  Shoulder flexion 140 119  Shoulder extension    Shoulder abduction 127 105  Shoulder adduction    Shoulder internal rotation    Shoulder external rotation    Elbow flexion    Elbow extension    Wrist flexion    Wrist extension    Wrist ulnar deviation    Wrist radial deviation    Wrist pronation    Wrist supination     (Blank rows =  not tested)  UPPER EXTREMITY MMT:  MMT Right eval Left eval  Shoulder flexion 5 5  Shoulder extension 4 4+  Shoulder abduction 5 4+  Shoulder adduction    Shoulder internal rotation 4+ 4  Shoulder external rotation 4 4  Middle trapezius    Lower trapezius     (Blank rows = not tested)   LOWER EXTREMITY ROM:    Limited B hip IR/ER but otherwise WFL  LOWER EXTREMITY MMT:  Tested in sitting on eval  MMT Right eval Left eval  Hip flexion 4 4  Hip extension 4 4  Hip abduction 4+ 4+  Hip adduction 4+ 4+  Hip internal rotation 4+ 4+  Hip external rotation 4 4  Knee flexion 5 5  Knee extension 5 5  Ankle dorsiflexion 4+ 4+  Ankle plantarflexion    Ankle inversion    Ankle eversion    (Blank rows = not tested)  BED MOBILITY:  NT  TRANSFERS: Assistive device utilized: None  Sit to stand: Complete Independence Stand to sit: Complete Independence Chair to chair: Complete Independence Floor:  NT  GAIT: Distance walked: Clinic distances Assistive device utilized: None Level of assistance: Complete Independence Gait pattern: step through pattern, decreased arm swing- Right, decreased arm swing- Left, lateral lean- Right, and decreased trunk rotation Comment: Gait speed = 2.97 ft/sec  RAMP: Level of Assistance:  NT Assistive device utilized: None Ramp Comments:   CURB:  Level of Assistance:  NT Assistive device utilized: None Curb Comments:   STAIRS:  Level of Assistance: Modified independence  Stair Negotiation Technique: Alternating Pattern  with Single  Rail on Right  Number of Stairs: 14    Height of Stairs: 7" Comments: Slower/more cautious on descent  FUNCTIONAL TESTS:  5 times sit to stand: 11.97 sec Timed up and go (TUG): Normal - 10.85 sec, Manual - 11.22 sec, Cognitive - 12.47 sec 10 meter walk test: 11.03 sec; Gait speed = 2.97 ft/sec  Berg Balance Scale: 46/56, 46-51 moderate fall risk (>50%)  (11/03/23) Functional gait assessment: 15/30, < 19 = high risk fall (11/03/23)  12/05/23: 5xSTS:  11.00 sec TUG: Normal - 7.97 sec, Manual - 9.90 sec, Cognitive - 10.41 sec : 9.50 sec Gait speed = 3.45 ft/sec  Berg: 52/56, 52-55 lower fall risk (> 25%)  FGA: 22/30, 19-24 = medium risk fall    Baselines as of D/C from last PT episode for Parkinson's (09/2022): 5 times sit to stand: 12.31 sec Berg Balance Scale: 54/56 TUG, & FGA not assessed due to patient unable to return for final visit  Baselines as of D/C from PT (02/2022):  Timed up and go (TUG): 8.88 sec (Normal) 10 meter walk test: 8.82 sec; Gait speed: 3.72 ft/sec  Functional gait assessment: 26/30  PATIENT SURVEYS:  ABC scale 1030 / 1600 = 64.4 %   TODAY'S TREATMENT:   12/05/23 THERAPEUTIC EXERCISE: to improve flexibility, strength and mobility.  Demonstration, verbal and tactile cues throughout for technique.  NuStep - L5 x 6 min (LE only)  THERAPEUTIC ACTIVITIES: 5xSTS:  11.00 sec TUG: Normal - 7.97 sec, Manual - 9.90 sec, Cognitive - 10.41 sec : 9.50 sec Gait speed = 3.45 ft/sec  Berg: 52/56, 52-55 lower fall risk (> 25%)  FGA: 22/30, 19-24 = medium risk fall    11/29/23 THERAPEUTIC EXERCISE: to improve flexibility, strength and mobility.  Demonstration, verbal and tactile cues throughout for technique.  NuStep - L5 x 6 min (LE only)  NEUROMUSCULAR RE-EDUCATION: To  improve posture, balance, proprioception, coordination, reduce fall risk, amplitude of movement, speed of movement to reduce bradykinesia, and reduce rigidity. Corner balance  progression with tandem stance on firm surface with arms wide, arms at sides, and arms crossed on chest            Eyes open: - static stance 3 x 30 sec bil - horiz head turns x 5 Alt fwd step with contralateral reach to cone atop vertical 36" FR x 10 Fwd PWR! Up Step x 10 alternating LE - pt positioned btw 2 chairs (make-shift parallel bars) for safety but not needing UE support Backward PWR! Up Step x 10 alternating LE - pt positioned btw 2 chairs (make-shift parallel bars) - chairs used for UE support initially, but gradually shifting to B fwd reach Step-through fwd & back x 10 each side, initially with B UE support on back of 2 chairs, progressing to contralateral UE reciprocal arm swing Step-through fwd & back x 5 each side, hands back with fwd swing/step of foot and hands fwd with backward swing/step   11/24/23 THERAPEUTIC EXERCISE: to improve flexibility, strength and mobility.  Demonstration, verbal and tactile cues throughout for technique.  NuStep - L4 x 6 min (LE only)  NEUROMUSCULAR RE-EDUCATION: To improve posture, balance, proprioception, coordination, reduce fall risk, amplitude of movement, and reduce rigidity. SLS + opp LE 5-way to tap to colored dots x 5 revolutions SLS + opp LE cone knock-down and righting x 6 cones with side-stepping btw cones Alt fwd step with contralateral reach to cone atop vertical 36" FR x 10 Static tandem stance 2 x 30" - intermittent UE support on counter Tandem gait 10 ft along counter (intermittent UE support on counter) - Fwd 4 x 10 ft, Retro 2 x 10 ft Fwd PWR! Up Step x 10 alternating LE - pt positioned btw 2 chairs (make-shift parallel bars) for safety but not needing UE support   PATIENT EDUCATION:  Education details: progress with PT, ongoing PT POC, and continue with current HEP  Person educated: Patient Education method: Explanation Education comprehension: verbalized understanding  HOME EXERCISE PROGRAM: Access Code: [email or web address] URL:  [email or web address] Date: 11/29/2023 Prepared by: Glenetta Hew  Exercises - Seated Hamstring Stretch  - 2 x daily - 7 x weekly - 3 reps - 30 sec hold - Seated Hip Abduction with Resistance  - 1 x daily - 3-4 x weekly - 2 sets - 10 reps - 3-5 sec hold - Seated March with Resistance  - 1 x daily - 3-4 x weekly - 2 sets - 10 reps - 3 sec hold - Sit to Stand with Resistance Around Legs  - 1 x daily - 3-4 x weekly - 2 sets - 10 reps - Seated Shoulder Setting  - 1 x daily - 7 x weekly - 2 sets - 10 reps - 3 sec hold - Standing Bilateral Low Shoulder Row with Anchored Resistance  - 1 x daily - 3-4 x weekly - 2 sets - 10 reps - 5 sec hold - Scapular Retraction with Resistance Advanced  - 1 x daily - 3-4 x weekly - 2 sets - 10 reps - 5 sec hold - Marching with Resistance  - 1 x daily - 3 x weekly - 2 sets - 10 reps - 3 sec hold - Standing Hip Extension with Resistance at Ankles and Counter Support  - 1 x daily - 3 x weekly - 2 sets - 10 reps - 3 sec hold - Semi-Tandem  Corner Balance With Eyes Open  - 1 x daily - 3 x weekly - 2 sets - 10 reps - 3 sec hold - Semi-Tandem Corner Balance: Eyes Open With Head Turns  - 1 x daily - 3 x weekly - 2 sets - 5 reps  PWR! Moves: - Sitting - Standing - Supine   ASSESSMENT:  CLINICAL IMPRESSION: Kaiya expressed concerns about her ongoing balance issues, noting she feels like she walks with better balance when she keeps her head flexed.  She states her neck pain is at least 50% better since the Botox injections, however her acquired postural deformity of her neck still keeps her head in flexion with right deviation.  Standardized balance testing revealing gains across all standardized tests today, with LTG met for Berg and significant improvement noted on FGA, 5xSTS and all versions of TUG.  Gait speed has also increased from 2.97 ft/sec to 3.45 ft/sec. Gait speed and FGA remain slightly shy of LTG's and previous baseline.  Cristine also continues to  fatigue quickly requiring frequent seated rest during standardized testing today.   Agam will benefit from continued skilled PT to address ongoing deficits to improve mobility and activity tolerance with decreased pain interference. She expresses interest in continuing with PT to further address her activity tolerance and balance, therefore anticipate recert next visit for extended POC to address remaining deficits with frequency likely reduced to 1x/wk.   OBJECTIVE IMPAIRMENTS: Abnormal gait, cardiopulmonary status limiting activity, decreased activity tolerance, decreased balance, decreased coordination, decreased endurance, decreased knowledge of condition, decreased mobility, difficulty walking, decreased ROM, decreased strength, hypomobility, increased fascial restrictions, impaired perceived functional ability, increased muscle spasms, impaired flexibility, improper body mechanics, postural dysfunction, and pain.   ACTIVITY LIMITATIONS: carrying, lifting, bending, sitting, standing, squatting, sleeping, stairs, reach over head, and locomotion level  PARTICIPATION LIMITATIONS: meal prep, cleaning, laundry, driving, shopping, community activity, and church  PERSONAL FACTORS: Age, Past/current experiences, Time since onset of injury/illness/exacerbation, and 3+ comorbidities: Chronic neck pain, L shoulder advanced OA s/p reverse TSA on 10/14/22; R TKA 07/2019, B THR 2018 & 2017, posterior C1-2 cervical fusion 2018, rupture of flexor tendon of L hand with failed surgical repair 2020, Parkinson's, psoriatic arthritis, bladder cancer, HTN  are also affecting patient's functional outcome.   REHAB POTENTIAL: Good  CLINICAL DECISION MAKING: Evolving/moderate complexity  EVALUATION COMPLEXITY: Moderate   GOALS: Goals reviewed with patient? Yes  SHORT TERM GOALS: Target date: 11/15/2023  Complete assessment of Berg and FGA and update goals and POC accordingly. Baseline:  Goal status: MET -  11/03/23  2.  Patient will be independent with initial HEP. Baseline:  Goal status: MET - 11/21/23   LONG TERM GOALS: Target date: 12/13/2023  Patient will be independent with ongoing/advanced HEP for self-management at home incorporating PWR! Moves as indicated.  Baseline:  Goal status: IN PROGRESS  2.  Patient will be able to ambulate 600' w/o AD with good safety to access community.  Baseline:  Goal status: IN PROGRESS  3.  Patient will be able to step up/down curb safely with LRAD for safety with community ambulation.  Baseline:  Goal status: IN PROGRESS   4.  Patient will report 25-50% reduction in L-sided neck/thoracic, scapular and shoulder pain to improve activity tolerance for mobility and ambulation.  Baseline: Goal status: IN PROGRESS - 12/05/23 - Pt reports 50% improvement with majority of benefit from Botox injections  5.  Patient will demonstrate gait speed of >/= 3.5 ft/sec to be a safe community  ambulator with decreased risk for recurrent falls.  Baseline: 2.97 ft/sec Goal status: IN PROGRESS - 12/05/23 - 3.45 ft/sec   6.  Patient will demonstrate at least 24/30 on FGA to improve gait stability and reduce risk for falls. (MCID = 4 points) Baseline: 15/30 (11/03/23) Goal status: IN PROGRESS - 12/05/23 - 22/30  7.  Patient will improve Berg score to >/= 52/56 to improve safety and stability with ADLs in standing and reduce risk for falls. (MCID = 8 points)   Baseline: 46/56 (11/03/23) Goal status: MET - 12/05/23 - 52/56   8.  Patient will report >/= 80% on ABC scale to demonstrate improved balance confidence and decreased risk for falls. Baseline: 64.4% Goal status: IN PROGRESS  9. Patient will verbalize understanding of local Parkinson's disease community resources, including community fitness post d/c. Baseline:  Goal status: IN PROGRESS   PLAN:  PT FREQUENCY: 2x/week  PT DURATION: 8 weeks  PLANNED INTERVENTIONS: 97164- PT Re-evaluation, 97110-Therapeutic  exercises, 97530- Therapeutic activity, 97112- Neuromuscular re-education, 97535- Self Care, [id number]- Manual therapy, [id number]- Gait training, 97014- Electrical stimulation (unattended), [id number]- Electrical stimulation (manual), [id number]- Ultrasound, [id number]- Ionotophoresis 4mg /ml Dexamethasone, Patient/Family education, Balance training, Stair training, Taping, Dry Needling, Joint mobilization, Spinal mobilization, Cryotherapy, and Moist heat  PLAN FOR NEXT SESSION: Complete LTG assessment - anticipate recert with frequency reduced to 1x/wk for up to 4 weeks; review and progression of PWR! Moves/patterns as tolerated; progress balance and dynamic stepping activities; core/LE and postural strengthening; MT +/- DN to address abnormal muscle tension and TPs   Marry Guan, PT 12/05/2023, 3:30 PM

## 2023-12-07 ENCOUNTER — Other Ambulatory Visit: Payer: Self-pay | Admitting: Family Medicine

## 2023-12-13 ENCOUNTER — Ambulatory Visit: Payer: Medicare Other | Admitting: Physical Therapy

## 2023-12-13 ENCOUNTER — Encounter: Payer: Self-pay | Admitting: Physical Therapy

## 2023-12-13 DIAGNOSIS — R2689 Other abnormalities of gait and mobility: Secondary | ICD-10-CM

## 2023-12-13 DIAGNOSIS — M546 Pain in thoracic spine: Secondary | ICD-10-CM | POA: Diagnosis not present

## 2023-12-13 DIAGNOSIS — R293 Abnormal posture: Secondary | ICD-10-CM

## 2023-12-13 DIAGNOSIS — R2681 Unsteadiness on feet: Secondary | ICD-10-CM

## 2023-12-13 DIAGNOSIS — M542 Cervicalgia: Secondary | ICD-10-CM

## 2023-12-13 NOTE — Therapy (Signed)
OUTPATIENT PHYSICAL THERAPY TREATMENT / RE-CERTIFICATION  Progress Note  Reporting Period 25-Oct-2023 to 12/13/2023   See note below for Objective Data and Assessment of Progress/Goals.     Patient Name: Stephanie [name] MRN: [id number] DOB:11/05/1932, 89 y.o., female Today's Date: 12/13/2023   END OF SESSION:  PT End of Session - 12/13/23 1401     Visit Number 9    Date for PT Re-Evaluation 01/10/24    Authorization Type Medicare & BCBS    PT Start Time 1401    PT Stop Time 1445    PT Time Calculation (min) 44 min    Activity Tolerance Patient tolerated treatment well    Behavior During Therapy WFL for tasks assessed/performed                 Past Medical History:  Diagnosis Date   Allergy    Anemia    past hx    Arthropathy of cervical spine 11/27/2012   Right  Xray at [facility]  On 04/08/2016  AP, lateral, and lateral flexion and extension views of the cervical spine are submitted for evaluation.   No acute fracture identified in the cervical spine.   There is redemonstration of approximately 2 mm of C3 on C4 anterolisthesis in neutral which slightly increases in flexion relative to neutral and extension. Slight C5 on C6 retrolisthesis does not change i   Benign hypertension without congestive heart failure    Benign mole    right hcets mole, bleeds when dried with towel   Bilateral dry eyes    Bladder cancer (HCC) 10/24/16   CAD (coronary artery disease), native coronary artery 04/2023   Coronary CTA showed a coronary calcium score to 46 with less than 25% left main, 25 to 49% proximal LAD, less than 25% left circumflex stenosis   Cataract    Cervical spondylosis    Cervicalgia    2 screws in place    Chronic kidney disease    Depression with anxiety October 24, 2016   prior to husbands death    Dyspnea    Gross hematuria    developed after first hip replacment, led to indicental finding of bladder tumor, bleeding now resolved    H/O total shoulder replacement, left     History of kidney stones    1970's   Hyperlipidemia    Left hand weakness    due to reinjury of flxor tendon s/p tendon surgery march 12-2018   Lumbar stenosis    Malignant tumor of urinary bladder (HCC) 07/2016   Pre-stage I   OA (osteoarthritis)    Parkinson disease [facility]) dx 2012   neurologist-  dr siddiqui at [facility]   Psoriatic arthritis [facility])     Dr Nickola Major , [place] [facility]    Rupture of flexor tendon of hand 2020   right   Past Surgical History:  Procedure Laterality Date   BIOPSY MASS LEFT FIRST METACARPAL   06/23/2006   benign   CATARACT EXTRACTION W/ INTRAOCULAR LENS  IMPLANT, BILATERAL  1999 and 2003   CERVICAL FUSION  02/2017   c1-c2 ; reports she has 2 screws 2in long in place done at [facility] ; patient exhibits VERY LIMITED NECK ROM   COLONOSCOPY     COSMETIC SURGERY     CYSTOSCOPY W/ RETROGRADES Bilateral 09/22/2016   Procedure: CYSTOSCOPY WITH RETROGRADE PYELOGRAM;  Surgeon: Sebastian Ache, MD;  Location: [facility];  Service: Urology;  Laterality: Bilateral;   D & C HYSTEROSCOPY W/  RESECTION POLYP  07/11/2000   DILATION AND CURETTAGE OF UTERUS  1960's   EXCISION CYST AND DEBRIDEMENT RIGHT WIRST AND REMOVAL FORGEIGN BODY  01/09/2009   EYE SURGERY     FLEXOR TENDON REPAIR Left 12/12/2018   Procedure: REPAIR/TRANSFER FLEXOR DIGITORUM PROFUNDUS OF LEFT SMALL FINGER;  Surgeon: Cindee Salt, MD;  Location: [facility];  Service: Orthopedics;  Laterality: Left;   JOINT REPLACEMENT     LAPAROSCOPY  yrs ago   infertility    METACARPOPHALANGEAL JOINT ARTHRODESIS Right 05/25/1996   REVERSE SHOULDER ARTHROPLASTY Left 10/14/2022   Procedure: REVERSE SHOULDER ARTHROPLASTY;  Surgeon: Francena Hanly, MD;  Location: [facility];  Service: Orthopedics;  Laterality: Left;    SPINE SURGERY     TENDON REPAIR Left 01/15/2019   Hand   TONSILLECTOMY AND ADENOIDECTOMY  child   TOTAL HIP ARTHROPLASTY Left 07/07/2016   Procedure: LEFT TOTAL HIP  ARTHROPLASTY ANTERIOR APPROACH;  Surgeon: Ollen Gross, MD;  Location: [facility];  Service: Orthopedics;  Laterality: Left;   TOTAL HIP ARTHROPLASTY Right 09/28/2017   Procedure: RIGHT TOTAL HIP ARTHROPLASTY ANTERIOR APPROACH;  Surgeon: Ollen Gross, MD;  Location: [facility];  Service: Orthopedics;  Laterality: Right;   TOTAL KNEE ARTHROPLASTY Right 07/30/2019   Procedure: TOTAL KNEE ARTHROPLASTY;  Surgeon: Ollen Gross, MD;  Location: [facility];  Service: Orthopedics;  Laterality: Right;    TRANSURETHRAL RESECTION OF BLADDER TUMOR N/A 09/22/2016   Procedure: TRANSURETHRAL RESECTION OF BLADDER TUMOR (TURBT);  Surgeon: Sebastian Ache, MD;  Location: [facility];  Service: Urology;  Laterality: N/A;   Patient Active Problem List   Diagnosis Date Noted   CAD (coronary artery disease), native coronary artery 04/20/2023   S/P reverse total shoulder arthroplasty, left 10/14/2022   Preoperative cardiovascular examination 10/04/2022   SOB (shortness of breath) 08/02/2022   Insomnia 08/02/2022   Hyponatremia 08/01/2022   Shoulder pain, left 09/14/2021   History of COVID-19 01/06/2021   Diarrhea 09/03/2020   Vitamin D deficiency 09/03/2020   OA (osteoarthritis) of knee 07/30/2019   Tinea corporis 06/08/2019   Psoriatic arthritis (HCC) 04/02/2019   Anemia 03/13/2018   Hot flashes 03/13/2018   S/P cervical spinal fusion 03/31/2017   Neck pain 12/12/2016   Depression with anxiety 10/17/2016   Bladder cancer (HCC) 10/17/2016   OA (osteoarthritis) of hip 07/07/2016   Spinal stenosis of lumbar region with radiculopathy 04/08/2016   Spondylolisthesis of lumbar region 04/08/2016   Spondylosis of cervical region without myelopathy or radiculopathy 04/08/2016   Preventative health care 01/18/2016   Low back pain 01/18/2016   History of chicken pox    Hyperlipidemia 12/15/2014   Allergic rhinitis 06/25/2014   Allergy to bee sting 06/10/2014   TMJ tenderness 11/16/2013   Other  malaise and fatigue 05/07/2013   Arthropathy of cervical spine (HCC) 11/27/2012   Muscle spasms of neck 11/27/2012   Parkinson's disease (HCC) 05/24/2012   Epiretinal membrane 03/09/2012   Hyperopia with astigmatism and presbyopia 03/09/2012   Pseudophakia 03/09/2012   HTN (hypertension) 08/31/2011   Right knee pain 08/31/2011   Benign hypertensive heart disease without heart failure 05/12/2011    PCP: Bradd Canary, MD   REFERRING PROVIDER: Jaquita Folds, MD   REFERRING DIAG: G20.A1 Parkinson's disease w/o dyskinesia or fluctuating manifestations  THERAPY DIAG:  Other abnormalities of gait and mobility  Unsteadiness on feet  Abnormal posture  Cervicalgia  Pain in thoracic spine  RATIONALE FOR EVALUATION AND TREATMENT: Rehabilitation  ONSET DATE: Diagnosed with  PD in ~2012  NEXT MD VISIT: 11/23/2023 - for Botox injections in neck and back   SUBJECTIVE:                                                                                                                                                                                                         SUBJECTIVE STATEMENT: Pt reports her neck and upper shoulder pain remains well controlled since the Botox injections a few weeks ago.  EVAL: Pt reports her neurologist has changed her PD meds from Sinemet to Rytary but they are still working on the right dosage as the current dosage leads to increased dyskinesia.  She notes more stiffness from the PD.  She notes much of her difficulty also seems to stem from her chronic neck and back pain.  She got some relief from Botox injections at 5 different places in her neck in Oct 2024 but she feels like the effects are starting to fade.  She notes more difficulty with walking related to the pain in her neck and pain - feels like she cannot stand up straight.  Pain keeps her from going to church or walking for exercise.  PAIN: Are you having pain? No and Yes: NPRS scale: 0/10 Pain  location: L neck/thoracic spine & upper shoulder into L UE and periscapular area Pain description: constant sharp ache Aggravating factors: sitting up straight, standing and walking Relieving factors: sitting in recliner, heat & vibration, ice, Meloxicam, Tylenol  PERTINENT HISTORY:  Chronic neck pain, L shoulder advanced OA s/p reverse TSA on 10/14/22; R TKA 07/2019, B THR 2018 & 2017, posterior C1-2 cervical fusion 2018, rupture of flexor tendon of L hand with failed surgical repair 2020, Parkinson's, psoriatic arthritis, bladder cancer, HTN    PRECAUTIONS: Fall  RED FLAGS: None  WEIGHT BEARING RESTRICTIONS: No  FALLS:  Has patient fallen in last 6 months? No  LIVING ENVIRONMENT: Lives with: lives alone Lives in: House/apartment Stairs: Yes: External: 4 steps; on right going up, on left going up, and can reach both Has following equipment at home: Single point cane, Walker - 2 wheeled, Tour manager, bed side commode, and Grab bars  OCCUPATION: Retired  [employer]: Independent and Leisure: used to Agricultural consultant at [employer], bible study, and walk 1/3 mile daily but recently unable    PATIENT GOALS: "Be able to walk (for exercise) again and go to church."   OBJECTIVE: (objective measures completed at initial evaluation unless otherwise dated)  DIAGNOSTIC FINDINGS:  N/A  COGNITION: Overall cognitive status: Within functional limits for tasks assessed and  feels like her memory is not as good as it once was   SENSATION: WFL  COORDINATION: Heel/toe WNL. Heel to shin limited more but muscle tightness/stiffness from prior joint replacements.  MUSCLE LENGTH:  TBA as indicated Hamstrings:  ITB:  Piriformis:  Hip flexors:  Quads:  Heelcord:   POSTURE:  rounded shoulders, forward head, weight shift left, and head rests in 25 flexion & 8 R lateral flexion  CERVICAL ROM:  Head rests in 25 flexion & 8 R lateral flexion (10/18/23)  Active ROM Eval  Flexion 58  Extension 10  Right  lateral flexion 20  Left lateral flexion 5  Right rotation 40  Left rotation 22   (Blank rows = not tested)  UPPER EXTREMITY ROM:  Active ROM Right eval Left eval  Shoulder flexion 140 119  Shoulder extension    Shoulder abduction 127 105  Shoulder adduction    Shoulder internal rotation    Shoulder external rotation    Elbow flexion    Elbow extension    Wrist flexion    Wrist extension    Wrist ulnar deviation    Wrist radial deviation    Wrist pronation    Wrist supination     (Blank rows = not tested)  UPPER EXTREMITY MMT:  MMT Right eval Left eval  Shoulder flexion 5 5  Shoulder extension 4 4+  Shoulder abduction 5 4+  Shoulder adduction    Shoulder internal rotation 4+ 4  Shoulder external rotation 4 4  Middle trapezius    Lower trapezius     (Blank rows = not tested)   LOWER EXTREMITY ROM:    Limited B hip IR/ER but otherwise WFL  LOWER EXTREMITY MMT:  Tested in sitting on eval  MMT Right eval Left eval  Hip flexion 4 4  Hip extension 4 4  Hip abduction 4+ 4+  Hip adduction 4+ 4+  Hip internal rotation 4+ 4+  Hip external rotation 4 4  Knee flexion 5 5  Knee extension 5 5  Ankle dorsiflexion 4+ 4+  Ankle plantarflexion    Ankle inversion    Ankle eversion    (Blank rows = not tested)  BED MOBILITY:  NT  TRANSFERS: Assistive device utilized: None  Sit to stand: Complete Independence Stand to sit: Complete Independence Chair to chair: Complete Independence Floor:  NT  GAIT: Distance walked: Clinic distances Assistive device utilized: None Level of assistance: Complete Independence Gait pattern: step through pattern, decreased arm swing- Right, decreased arm swing- Left, lateral lean- Right, and decreased trunk rotation Comment: Gait speed = 2.97 ft/sec  RAMP: Level of Assistance:  NT Assistive device utilized: None Ramp Comments:   CURB:  Level of Assistance:  NT Assistive device utilized: None Curb Comments:   STAIRS:  Level  of Assistance: Modified independence  Stair Negotiation Technique: Alternating Pattern  with Single Rail on Right  Number of Stairs: 14    Height of Stairs: 7" Comments: Slower/more cautious on descent  FUNCTIONAL TESTS:  5 times sit to stand: 11.97 sec Timed up and go (TUG): Normal - 10.85 sec, Manual - 11.22 sec, Cognitive - 12.47 sec 10 meter walk test: 11.03 sec; Gait speed = 2.97 ft/sec  Berg Balance Scale: 46/56, 46-51 moderate fall risk (>50%)  (11/03/23) Functional gait assessment: 15/30, < 19 = high risk fall (11/03/23)  12/05/23: 5xSTS:  11.00 sec TUG: Normal - 7.97 sec, Manual - 9.90 sec, Cognitive - 10.41 sec : 9.50 sec Gait speed = 3.45  ft/sec  Sharlene Motts: 52/56, 52-55 lower fall risk (> 25%)  FGA: 22/30, 19-24 = medium risk fall    Baselines as of D/C from last PT episode for Parkinson's (09/2022): 5 times sit to stand: 12.31 sec Berg Balance Scale: 54/56 TUG, & FGA not assessed due to patient unable to return for final visit  Baselines as of D/C from PT (02/2022):  Timed up and go (TUG): 8.88 sec (Normal) 10 meter walk test: 8.82 sec; Gait speed: 3.72 ft/sec  Functional gait assessment: 26/30  PATIENT SURVEYS:  ABC scale 1030 / 1600 = 64.4 %   TODAY'S TREATMENT:   12/13/23 THERAPEUTIC EXERCISE: to improve flexibility, strength and mobility.  Demonstration, verbal and tactile cues throughout for technique.  NuStep - L5 x 6 min (LE only)  THERAPEUTIC ACTIVITIES: ABC scale:  1290 / 1600 = 80.6 % Goal assessment  GAIT TRAINING: To improve safety with community ambulation . 600 ft w/o AD independent demonstrating good reciprocal arm swing, good foot clearance with good heel strike and good heel-toe progression; she still demonstrates slight R trunk/head lean with fwd flexed head due to fixed postural abnormalities Up/down 6" curb w/o AD independent  NEUROMUSCULAR RE-EDUCATION: To improve balance, proprioception, coordination, and reduce fall risk.   Corner balance progression with 3/4 tandem stance on firm surface and Airex pad with arms at sides and arms crossed on chest.            Eyes open: static stance x 30 sec horiz head turns x 5 vertical head nods x 5 trunk rotation x 5 Eyes closed: static stance x 20 sec   12/05/23 THERAPEUTIC EXERCISE: to improve flexibility, strength and mobility.  Demonstration, verbal and tactile cues throughout for technique.  NuStep - L5 x 6 min (LE only)  THERAPEUTIC ACTIVITIES: 5xSTS:  11.00 sec TUG: Normal - 7.97 sec, Manual - 9.90 sec, Cognitive - 10.41 sec : 9.50 sec Gait speed = 3.45 ft/sec  Berg: 52/56, 52-55 lower fall risk (> 25%)  FGA: 22/30, 19-24 = medium risk fall    11/29/23 THERAPEUTIC EXERCISE: to improve flexibility, strength and mobility.  Demonstration, verbal and tactile cues throughout for technique.  NuStep - L5 x 6 min (LE only)  NEUROMUSCULAR RE-EDUCATION: To improve posture, balance, proprioception, coordination, reduce fall risk, amplitude of movement, speed of movement to reduce bradykinesia, and reduce rigidity. Corner balance progression with tandem stance on firm surface with arms wide, arms at sides, and arms crossed on chest            Eyes open: - static stance 3 x 30 sec bil - horiz head turns x 5 Alt fwd step with contralateral reach to cone atop vertical 36" FR x 10 Fwd PWR! Up Step x 10 alternating LE - pt positioned btw 2 chairs (make-shift parallel bars) for safety but not needing UE support Backward PWR! Up Step x 10 alternating LE - pt positioned btw 2 chairs (make-shift parallel bars) - chairs used for UE support initially, but gradually shifting to B fwd reach Step-through fwd & back x 10 each side, initially with B UE support on back of 2 chairs, progressing to contralateral UE reciprocal arm swing Step-through fwd & back x 5 each side, hands back with fwd swing/step of foot and hands fwd with backward swing/step   PATIENT EDUCATION:   Education details: progress with PT, ongoing PT POC, and continue with current HEP  Person educated: Patient Education method: Explanation Education comprehension: verbalized understanding  HOME EXERCISE  PROGRAM: Access Code: [email or web address] URL: [email or web address] Date: 11/29/2023 Prepared by: Glenetta Hew  Exercises - Seated Hamstring Stretch  - 2 x daily - 7 x weekly - 3 reps - 30 sec hold - Seated Hip Abduction with Resistance  - 1 x daily - 3-4 x weekly - 2 sets - 10 reps - 3-5 sec hold - Seated March with Resistance  - 1 x daily - 3-4 x weekly - 2 sets - 10 reps - 3 sec hold - Sit to Stand with Resistance Around Legs  - 1 x daily - 3-4 x weekly - 2 sets - 10 reps - Seated Shoulder Setting  - 1 x daily - 7 x weekly - 2 sets - 10 reps - 3 sec hold - Standing Bilateral Low Shoulder Row with Anchored Resistance  - 1 x daily - 3-4 x weekly - 2 sets - 10 reps - 5 sec hold - Scapular Retraction with Resistance Advanced  - 1 x daily - 3-4 x weekly - 2 sets - 10 reps - 5 sec hold - Marching with Resistance  - 1 x daily - 3 x weekly - 2 sets - 10 reps - 3 sec hold - Standing Hip Extension with Resistance at Ankles and Counter Support  - 1 x daily - 3 x weekly - 2 sets - 10 reps - 3 sec hold - Semi-Tandem Corner Balance With Eyes Open  - 1 x daily - 3 x weekly - 2 sets - 10 reps - 3 sec hold - Semi-Tandem Corner Balance: Eyes Open With Head Turns  - 1 x daily - 3 x weekly - 2 sets - 5 reps  PWR! Moves: - Sitting - Standing - Supine   ASSESSMENT:  CLINICAL IMPRESSION: Keslie has demonstrated good progress with PT with improvement noted in gait pattern and stability as well as gait speed and dynamic balance per standardized balance testing, although gait speed and FGA remain slightly shy of LTG levels and previous baseline.  Her perceived balance confidence per the ABC scale has improved from 64.4% to 80.6% indicating an improvement from a moderate to a high level level of  physical functioning, although she continues to verbalize concerns related to her balance and would like to continue to work on this with PT.  Shakeita will benefit from continued skilled PT to address ongoing balance and gait deficits to improve mobility and activity tolerance with decreased pain interference and reduced risk for falls, therefore will recommend recert for additional 1x/wk for up to 4 weeks.   OBJECTIVE IMPAIRMENTS: Abnormal gait, cardiopulmonary status limiting activity, decreased activity tolerance, decreased balance, decreased coordination, decreased endurance, decreased knowledge of condition, decreased mobility, difficulty walking, decreased ROM, decreased strength, hypomobility, increased fascial restrictions, impaired perceived functional ability, increased muscle spasms, impaired flexibility, improper body mechanics, postural dysfunction, and pain.   ACTIVITY LIMITATIONS: carrying, lifting, bending, sitting, standing, squatting, sleeping, stairs, reach over head, and locomotion level  PARTICIPATION LIMITATIONS: meal prep, cleaning, laundry, driving, shopping, community activity, and church  PERSONAL FACTORS: Age, Past/current experiences, Time since onset of injury/illness/exacerbation, and 3+ comorbidities: Chronic neck pain, L shoulder advanced OA s/p reverse TSA on 10/14/22; R TKA 07/2019, B THR 2018 & 2017, posterior C1-2 cervical fusion 2018, rupture of flexor tendon of L hand with failed surgical repair 2020, Parkinson's, psoriatic arthritis, bladder cancer, HTN  are also affecting patient's functional outcome.   REHAB POTENTIAL: Good  CLINICAL DECISION MAKING: Evolving/moderate complexity  EVALUATION COMPLEXITY: Moderate   GOALS: Goals reviewed  with patient? Yes  SHORT TERM GOALS: Target date: 11/15/2023  Complete assessment of Berg and FGA and update goals and POC accordingly. Baseline:  Goal status: MET - 11/03/23  2.  Patient will be independent with initial  HEP. Baseline:  Goal status: MET - 11/21/23   LONG TERM GOALS: Target date: 12/13/2023, extended to 01/10/2024   Patient will be independent with ongoing/advanced HEP for self-management at home incorporating PWR! Moves as indicated.  Baseline:  Goal status: IN PROGRESS - 12/13/23 - Patient reports no concerns or issues with flexibility and strengthening exercises or PWR! Moves but still feels unsure of balance related activities  2.  Patient will be able to ambulate 600' w/o AD with good safety to access community.  Baseline:  Goal status: MET - 12/13/23   3.  Patient will be able to step up/down curb safely with LRAD for safety with community ambulation.  Baseline:  Goal status: MET - 12/13/23   4.  Patient will report 25-50% reduction in L-sided neck/thoracic, scapular and shoulder pain to improve activity tolerance for mobility and ambulation.  Baseline: Goal status: MET - 12/05/23 - Pt reports 50% improvement with majority of benefit from Botox injections  5.  Patient will demonstrate gait speed of >/= 3.5 ft/sec to be a safe community ambulator with decreased risk for recurrent falls.  Baseline: 2.97 ft/sec Goal status: IN PROGRESS - 12/05/23 - 3.45 ft/sec   6.  Patient will demonstrate at least 24/30 on FGA to improve gait stability and reduce risk for falls. (MCID = 4 points) Baseline: 15/30 (11/03/23) Goal status: IN PROGRESS - 12/05/23 - 22/30  7.  Patient will improve Berg score to >/= 52/56 to improve safety and stability with ADLs in standing and reduce risk for falls. (MCID = 8 points)   Baseline: 46/56 (11/03/23) Goal status: MET - 12/05/23 - 52/56   8.  Patient will report >/= 80% on ABC scale to demonstrate improved balance confidence and decreased risk for falls. Baseline: 64.4% Goal status: MET - 12/13/23 - 1290 / 1600 = 80.6 %  9. Patient will verbalize understanding of local Parkinson's disease community resources, including community fitness post d/c. Baseline:  Goal  status: IN PROGRESS - 12/13/23 - patient has not been able to join any of the groups/meetings due to conflicting MD and other appointments, but states she still receives the monthly Power over [employer] and she has been invited to join a Parkinson's exercise group at a local church   PLAN:  PT FREQUENCY: 1x/week  PT DURATION: 4 weeks  PLANNED INTERVENTIONS: 97164- PT Re-evaluation, 97110-Therapeutic exercises, 97530- Therapeutic activity, 97112- Neuromuscular re-education, 97535- Self Care, 16109- Manual therapy, [id number]- Gait training, 97014- Electrical stimulation (unattended), [id number]- Electrical stimulation (manual), [id number]- Ultrasound, [id number]- Ionotophoresis 4mg /ml Dexamethasone, Patient/Family education, Balance training, Stair training, Taping, Dry Needling, Joint mobilization, Spinal mobilization, Cryotherapy, and Moist heat  PLAN FOR NEXT SESSION:  progress balance and dynamic stepping activities; review and progression of PWR! Moves/patterns as tolerated; core/LE and postural strengthening; MT +/- DN to address abnormal muscle tension and TPs   Marry Guan, PT 12/13/2023, 3:17 PM

## 2023-12-19 ENCOUNTER — Ambulatory Visit: Payer: Medicare Other | Attending: Neurology | Admitting: Physical Therapy

## 2023-12-19 ENCOUNTER — Encounter: Payer: Self-pay | Admitting: Physical Therapy

## 2023-12-19 DIAGNOSIS — R2681 Unsteadiness on feet: Secondary | ICD-10-CM | POA: Insufficient documentation

## 2023-12-19 DIAGNOSIS — R2689 Other abnormalities of gait and mobility: Secondary | ICD-10-CM | POA: Insufficient documentation

## 2023-12-19 DIAGNOSIS — R293 Abnormal posture: Secondary | ICD-10-CM | POA: Insufficient documentation

## 2023-12-19 DIAGNOSIS — M542 Cervicalgia: Secondary | ICD-10-CM | POA: Diagnosis not present

## 2023-12-19 DIAGNOSIS — M546 Pain in thoracic spine: Secondary | ICD-10-CM | POA: Insufficient documentation

## 2023-12-19 NOTE — Therapy (Signed)
OUTPATIENT PHYSICAL THERAPY TREATMENT     Patient Name: Stephanie [name] MRN: [id number] DOB:October 10, 1932, 89 y.o., female Today's Date: 12/19/2023   END OF SESSION:  PT End of Session - 12/19/23 1316     Visit Number 10    Date for PT Re-Evaluation 01/10/24    Authorization Type Medicare & BCBS    Progress Note Due on Visit --   /Recert/PN on visit #9 - 12/13/23   PT Start Time 1316    PT Stop Time 1356    PT Time Calculation (min) 40 min    Activity Tolerance Patient tolerated treatment well    Behavior During Therapy [facility] for tasks assessed/performed                 Past Medical History:  Diagnosis Date   Allergy    Anemia    past hx    Arthropathy of cervical spine 11/27/2012   Right  Xray at [facility]  On 04/08/2016  AP, lateral, and lateral flexion and extension views of the cervical spine are submitted for evaluation.   No acute fracture identified in the cervical spine.   There is redemonstration of approximately 2 mm of C3 on C4 anterolisthesis in neutral which slightly increases in flexion relative to neutral and extension. Slight C5 on C6 retrolisthesis does not change i   Benign hypertension without congestive heart failure    Benign mole    right hcets mole, bleeds when dried with towel   Bilateral dry eyes    Bladder cancer (HCC) 10/20/16   CAD (coronary artery disease), native coronary artery 04/2023   Coronary CTA showed a coronary calcium score to 46 with less than 25% left main, 25 to 49% proximal LAD, less than 25% left circumflex stenosis   Cataract    Cervical spondylosis    Cervicalgia    2 screws in place    Chronic kidney disease    Depression with anxiety 20-Oct-2016   prior to husbands death    Dyspnea    Gross hematuria    developed after first hip replacment, led to indicental finding of bladder tumor, bleeding now resolved    H/O total shoulder replacement, left    History of kidney stones    1970's   Hyperlipidemia    Left hand weakness     due to reinjury of flxor tendon s/p tendon surgery march 12-2018   Lumbar stenosis    Malignant tumor of urinary bladder (HCC) 07/2016   Pre-stage I   OA (osteoarthritis)    Parkinson disease [facility]) dx 2012   neurologist-  dr siddiqui at [facility]   Psoriatic arthritis [facility])     Dr Nickola Major , [place] [facility]    Rupture of flexor tendon of hand 2020   right   Past Surgical History:  Procedure Laterality Date   BIOPSY MASS LEFT FIRST METACARPAL   06/23/2006   benign   CATARACT EXTRACTION W/ INTRAOCULAR LENS  IMPLANT, BILATERAL  1999 and 2003   CERVICAL FUSION  02/2017   c1-c2 ; reports she has 2 screws 2in long in place done at [facility] ; patient exhibits VERY LIMITED NECK ROM   COLONOSCOPY     COSMETIC SURGERY     CYSTOSCOPY W/ RETROGRADES Bilateral 09/22/2016   Procedure: CYSTOSCOPY WITH RETROGRADE PYELOGRAM;  Surgeon: Sebastian Ache, MD;  Location: [facility];  Service: Urology;  Laterality: Bilateral;   D & C HYSTEROSCOPY W/ RESECTION POLYP  07/11/2000   DILATION  AND CURETTAGE OF UTERUS  1960's   EXCISION CYST AND DEBRIDEMENT RIGHT WIRST AND REMOVAL FORGEIGN BODY  01/09/2009   EYE SURGERY     FLEXOR TENDON REPAIR Left 12/12/2018   Procedure: REPAIR/TRANSFER FLEXOR DIGITORUM PROFUNDUS OF LEFT SMALL FINGER;  Surgeon: Cindee Salt, MD;  Location: [facility];  Service: Orthopedics;  Laterality: Left;   JOINT REPLACEMENT     LAPAROSCOPY  yrs ago   infertility    METACARPOPHALANGEAL JOINT ARTHRODESIS Right 05/25/1996   REVERSE SHOULDER ARTHROPLASTY Left 10/14/2022   Procedure: REVERSE SHOULDER ARTHROPLASTY;  Surgeon: Francena Hanly, MD;  Location: [facility];  Service: Orthopedics;  Laterality: Left;    SPINE SURGERY     TENDON REPAIR Left 01/15/2019   Hand   TONSILLECTOMY AND ADENOIDECTOMY  child   TOTAL HIP ARTHROPLASTY Left 07/07/2016   Procedure: LEFT TOTAL HIP ARTHROPLASTY ANTERIOR APPROACH;  Surgeon: Ollen Gross, MD;  Location: [facility];   Service: Orthopedics;  Laterality: Left;   TOTAL HIP ARTHROPLASTY Right 09/28/2017   Procedure: RIGHT TOTAL HIP ARTHROPLASTY ANTERIOR APPROACH;  Surgeon: Ollen Gross, MD;  Location: [facility];  Service: Orthopedics;  Laterality: Right;   TOTAL KNEE ARTHROPLASTY Right 07/30/2019   Procedure: TOTAL KNEE ARTHROPLASTY;  Surgeon: Ollen Gross, MD;  Location: [facility];  Service: Orthopedics;  Laterality: Right;    TRANSURETHRAL RESECTION OF BLADDER TUMOR N/A 09/22/2016   Procedure: TRANSURETHRAL RESECTION OF BLADDER TUMOR (TURBT);  Surgeon: Sebastian Ache, MD;  Location: [facility];  Service: Urology;  Laterality: N/A;   Patient Active Problem List   Diagnosis Date Noted   CAD (coronary artery disease), native coronary artery 04/20/2023   S/P reverse total shoulder arthroplasty, left 10/14/2022   Preoperative cardiovascular examination 10/04/2022   SOB (shortness of breath) 08/02/2022   Insomnia 08/02/2022   Hyponatremia 08/01/2022   Shoulder pain, left 09/14/2021   History of COVID-19 01/06/2021   Diarrhea 09/03/2020   Vitamin D deficiency 09/03/2020   OA (osteoarthritis) of knee 07/30/2019   Tinea corporis 06/08/2019   Psoriatic arthritis (HCC) 04/02/2019   Anemia 03/13/2018   Hot flashes 03/13/2018   S/P cervical spinal fusion 03/31/2017   Neck pain 12/12/2016   Depression with anxiety 10/17/2016   Bladder cancer (HCC) 10/17/2016   OA (osteoarthritis) of hip 07/07/2016   Spinal stenosis of lumbar region with radiculopathy 04/08/2016   Spondylolisthesis of lumbar region 04/08/2016   Spondylosis of cervical region without myelopathy or radiculopathy 04/08/2016   Preventative health care 01/18/2016   Low back pain 01/18/2016   History of chicken pox    Hyperlipidemia 12/15/2014   Allergic rhinitis 06/25/2014   Allergy to bee sting 06/10/2014   TMJ tenderness 11/16/2013   Other malaise and fatigue 05/07/2013   Arthropathy of cervical spine (HCC) 11/27/2012    Muscle spasms of neck 11/27/2012   Parkinson's disease (HCC) 05/24/2012   Epiretinal membrane 03/09/2012   Hyperopia with astigmatism and presbyopia 03/09/2012   Pseudophakia 03/09/2012   HTN (hypertension) 08/31/2011   Right knee pain 08/31/2011   Benign hypertensive heart disease without heart failure 05/12/2011    PCP: Bradd Canary, MD   REFERRING PROVIDER: Jaquita Folds, MD   REFERRING DIAG: G20.A1 Parkinson's disease w/o dyskinesia or fluctuating manifestations  THERAPY DIAG:  No diagnosis found.  RATIONALE FOR EVALUATION AND TREATMENT: Rehabilitation  ONSET DATE: Diagnosed with PD in ~2012  NEXT MD VISIT: 11/23/2023 - for Botox injections in neck and back   SUBJECTIVE:  SUBJECTIVE STATEMENT: Pt reports she was able to go to church for the 1st time in a year yesterday.  EVAL: Pt reports her neurologist has changed her PD meds from Sinemet to Rytary but they are still working on the right dosage as the current dosage leads to increased dyskinesia.  She notes more stiffness from the PD.  She notes much of her difficulty also seems to stem from her chronic neck and back pain.  She got some relief from Botox injections at 5 different places in her neck in Oct 2024 but she feels like the effects are starting to fade.  She notes more difficulty with walking related to the pain in her neck and pain - feels like she cannot stand up straight.  Pain keeps her from going to church or walking for exercise.  PAIN: Are you having pain? Yes: NPRS scale: 2-3/10 Pain location: L upper shoulder Pain description: tightness & ache Aggravating factors: sitting up straight, standing and walking Relieving factors: sitting in recliner, heat & vibration, ice, Meloxicam, Tylenol  PERTINENT  HISTORY:  Chronic neck pain, L shoulder advanced OA s/p reverse TSA on 10/14/22; R TKA 07/2019, B THR 2018 & 2017, posterior C1-2 cervical fusion 2018, rupture of flexor tendon of L hand with failed surgical repair 2020, Parkinson's, psoriatic arthritis, bladder cancer, HTN    PRECAUTIONS: Fall  RED FLAGS: None  WEIGHT BEARING RESTRICTIONS: No  FALLS:  Has patient fallen in last 6 months? No  LIVING ENVIRONMENT: Lives with: lives alone Lives in: House/apartment Stairs: Yes: External: 4 steps; on right going up, on left going up, and can reach both Has following equipment at home: Single point cane, Walker - 2 wheeled, Tour manager, bed side commode, and Grab bars  OCCUPATION: Retired  [employer]: Independent and Leisure: used to Agricultural consultant at [employer], bible study, and walk 1/3 mile daily but recently unable    PATIENT GOALS: "Be able to walk (for exercise) again and go to church."   OBJECTIVE: (objective measures completed at initial evaluation unless otherwise dated)  DIAGNOSTIC FINDINGS:  N/A  COGNITION: Overall cognitive status: Within functional limits for tasks assessed and feels like her memory is not as good as it once was   SENSATION: WFL  COORDINATION: Heel/toe WNL. Heel to shin limited more but muscle tightness/stiffness from prior joint replacements.  MUSCLE LENGTH:  TBA as indicated Hamstrings:  ITB:  Piriformis:  Hip flexors:  Quads:  Heelcord:   POSTURE:  rounded shoulders, forward head, weight shift left, and head rests in 25 flexion & 8 R lateral flexion  CERVICAL ROM:  Head rests in 25 flexion & 8 R lateral flexion (10/18/23)  Active ROM Eval  Flexion 58  Extension 10  Right lateral flexion 20  Left lateral flexion 5  Right rotation 40  Left rotation 22   (Blank rows = not tested)  UPPER EXTREMITY ROM:  Active ROM Right eval Left eval  Shoulder flexion 140 119  Shoulder extension    Shoulder abduction 127 105  Shoulder adduction     Shoulder internal rotation    Shoulder external rotation    Elbow flexion    Elbow extension    Wrist flexion    Wrist extension    Wrist ulnar deviation    Wrist radial deviation    Wrist pronation    Wrist supination     (Blank rows = not tested)  UPPER EXTREMITY MMT:  MMT Right eval Left eval  Shoulder flexion 5 5  Shoulder extension 4 4+  Shoulder abduction 5 4+  Shoulder adduction    Shoulder internal rotation 4+ 4  Shoulder external rotation 4 4  Middle trapezius    Lower trapezius     (Blank rows = not tested)   LOWER EXTREMITY ROM:    Limited B hip IR/ER but otherwise [facility]  LOWER EXTREMITY MMT:  Tested in sitting on eval  MMT Right eval Left eval R 12/19/23 L 12/19/23  Hip flexion 4 4 5 5   Hip extension 4 4 5 5   Hip abduction 4+ 4+ 5 5  Hip adduction 4+ 4+ 5 5  Hip internal rotation 4+ 4+ 5 5  Hip external rotation 4 4 4+ 4+  Knee flexion 5 5 5 5   Knee extension 5 5 5 5   Ankle dorsiflexion 4+ 4+ 5 5  Ankle plantarflexion      Ankle inversion      Ankle eversion      (Blank rows = not tested)  BED MOBILITY:  NT  TRANSFERS: Assistive device utilized: None  Sit to stand: Complete Independence Stand to sit: Complete Independence Chair to chair: Complete Independence Floor:  NT  GAIT: Distance walked: Clinic distances Assistive device utilized: None Level of assistance: Complete Independence Gait pattern: step through pattern, decreased arm swing- Right, decreased arm swing- Left, lateral lean- Right, and decreased trunk rotation Comment: Gait speed = 2.97 ft/sec  RAMP: Level of Assistance:  NT Assistive device utilized: None Ramp Comments:   CURB:  Level of Assistance:  NT Assistive device utilized: None Curb Comments:   STAIRS:  Level of Assistance: Modified independence  Stair Negotiation Technique: Alternating Pattern  with Single Rail on Right  Number of Stairs: 14    Height of Stairs: 7" Comments: Slower/more cautious on  descent  FUNCTIONAL TESTS:  5 times sit to stand: 11.97 sec Timed up and go (TUG): Normal - 10.85 sec, Manual - 11.22 sec, Cognitive - 12.47 sec 10 meter walk test: 11.03 sec; Gait speed = 2.97 ft/sec  Berg Balance Scale: 46/56, 46-51 moderate fall risk (>50%)  (11/03/23) Functional gait assessment: 15/30, < 19 = high risk fall (11/03/23)  12/05/23: 5xSTS:  11.00 sec TUG: Normal - 7.97 sec, Manual - 9.90 sec, Cognitive - 10.41 sec : 9.50 sec Gait speed = 3.45 ft/sec  Berg: 52/56, 52-55 lower fall risk (> 25%)  FGA: 22/30, 19-24 = medium risk fall    Baselines as of D/C from last PT episode for Parkinson's (09/2022): 5 times sit to stand: 12.31 sec Berg Balance Scale: 54/56 TUG, & FGA not assessed due to patient unable to return for final visit  Baselines as of D/C from PT (02/2022):  Timed up and go (TUG): 8.88 sec (Normal) 10 meter walk test: 8.82 sec; Gait speed: 3.72 ft/sec  Functional gait assessment: 26/30  PATIENT SURVEYS:  ABC scale 1030 / 1600 = 64.4 %   TODAY'S TREATMENT:   12/19/23 THERAPEUTIC EXERCISE: to improve flexibility, strength and mobility.  Demonstration, verbal and tactile cues throughout for technique.  NuStep - L5 x 6 min (LE only)  LE MMT  NEUROMUSCULAR RE-EDUCATION: To improve balance, proprioception, coordination, and reduce fall risk. SBA/CGA of PT for all activities.    R/L SLS 3 x 10-15" (duration as able) R/L SLS + UE side-side reach to 2 cones on counter R/L SLS + 3-way toe tap to 3 balance pebbles placed anterior, lateral and posterior x 10 each R/L SLS + cone knock-down and right with  opp foot x 6 cone (side-step-between cones) Braiding 2 x 10 ft bilateral with back 6" from wall Grapevine 2 x 10 ft bilateral with back 6" from wall Tandem gait fwd & back w/in arms reach of wall 2 x 10 ft   12/13/23 THERAPEUTIC EXERCISE: to improve flexibility, strength and mobility.  Demonstration, verbal and tactile cues throughout for  technique.  NuStep - L5 x 6 min (LE only)  THERAPEUTIC ACTIVITIES: ABC scale:  1290 / 1600 = 80.6 % Goal assessment  GAIT TRAINING: To improve safety with community ambulation . 600 ft w/o AD independent demonstrating good reciprocal arm swing, good foot clearance with good heel strike and good heel-toe progression; she still demonstrates slight R trunk/head lean with fwd flexed head due to fixed postural abnormalities Up/down 6" curb w/o AD independent  NEUROMUSCULAR RE-EDUCATION: To improve balance, proprioception, coordination, and reduce fall risk.  Corner balance progression with 3/4 tandem stance on firm surface and Airex pad with arms at sides and arms crossed on chest.            Eyes open: static stance x 30 sec horiz head turns x 5 vertical head nods x 5 trunk rotation x 5 Eyes closed: static stance x 20 sec   12/05/23 THERAPEUTIC EXERCISE: to improve flexibility, strength and mobility.  Demonstration, verbal and tactile cues throughout for technique.  NuStep - L5 x 6 min (LE only)  THERAPEUTIC ACTIVITIES: 5xSTS:  11.00 sec TUG: Normal - 7.97 sec, Manual - 9.90 sec, Cognitive - 10.41 sec : 9.50 sec Gait speed = 3.45 ft/sec  Berg: 52/56, 52-55 lower fall risk (> 25%)  FGA: 22/30, 19-24 = medium risk fall    PATIENT EDUCATION:  Education details: continue with current HEP  Person educated: Patient Education method: Explanation Education comprehension: verbalized understanding  HOME EXERCISE PROGRAM: Access Code: [email or web address] URL: [email or web address] Date: 11/29/2023 Prepared by: Glenetta Hew  Exercises - Seated Hamstring Stretch  - 2 x daily - 7 x weekly - 3 reps - 30 sec hold - Seated Hip Abduction with Resistance  - 1 x daily - 3-4 x weekly - 2 sets - 10 reps - 3-5 sec hold - Seated March with Resistance  - 1 x daily - 3-4 x weekly - 2 sets - 10 reps - 3 sec hold - Sit to Stand with Resistance Around Legs  - 1 x daily - 3-4 x weekly - 2  sets - 10 reps - Seated Shoulder Setting  - 1 x daily - 7 x weekly - 2 sets - 10 reps - 3 sec hold - Standing Bilateral Low Shoulder Row with Anchored Resistance  - 1 x daily - 3-4 x weekly - 2 sets - 10 reps - 5 sec hold - Scapular Retraction with Resistance Advanced  - 1 x daily - 3-4 x weekly - 2 sets - 10 reps - 5 sec hold - Marching with Resistance  - 1 x daily - 3 x weekly - 2 sets - 10 reps - 3 sec hold - Standing Hip Extension with Resistance at Ankles and Counter Support  - 1 x daily - 3 x weekly - 2 sets - 10 reps - 3 sec hold - Semi-Tandem Corner Balance With Eyes Open  - 1 x daily - 3 x weekly - 2 sets - 10 reps - 3 sec hold - Semi-Tandem Corner Balance: Eyes Open With Head Turns  - 1 x daily - 3 x weekly - 2 sets - 5 reps  PWR! Moves: - Sitting - Standing - Supine   ASSESSMENT:  CLINICAL IMPRESSION: Modest reports she was able to attend church services for the first time in ~1 year without limitation due to pain or impaired mobility.  She remains concerned about her ability to continue to level on her and wants to continue to work on her balance therefore focused on static and dynamic balance with varying base of support and stepping patterns to promote improved stability and balance with ADLs in her home.  Cues necessary to slow pace at times to better focus on muscle recruitment for balance.  Intermittent seated rest breaks necessary due to fatigue.  Mandie will benefit from continued skilled PT to address ongoing balance and gait deficits to improve mobility and activity tolerance with decreased pain interference and reduced risk for falls.   OBJECTIVE IMPAIRMENTS: Abnormal gait, cardiopulmonary status limiting activity, decreased activity tolerance, decreased balance, decreased coordination, decreased endurance, decreased knowledge of condition, decreased mobility, difficulty walking, decreased ROM, decreased strength, hypomobility, increased fascial restrictions, impaired  perceived functional ability, increased muscle spasms, impaired flexibility, improper body mechanics, postural dysfunction, and pain.   ACTIVITY LIMITATIONS: carrying, lifting, bending, sitting, standing, squatting, sleeping, stairs, reach over head, and locomotion level  PARTICIPATION LIMITATIONS: meal prep, cleaning, laundry, driving, shopping, community activity, and church  PERSONAL FACTORS: Age, Past/current experiences, Time since onset of injury/illness/exacerbation, and 3+ comorbidities: Chronic neck pain, L shoulder advanced OA s/p reverse TSA on 10/14/22; R TKA 07/2019, B THR 2018 & 2017, posterior C1-2 cervical fusion 2018, rupture of flexor tendon of L hand with failed surgical repair 2020, Parkinson's, psoriatic arthritis, bladder cancer, HTN  are also affecting patient's functional outcome.   REHAB POTENTIAL: Good  CLINICAL DECISION MAKING: Evolving/moderate complexity  EVALUATION COMPLEXITY: Moderate   GOALS: Goals reviewed with patient? Yes  SHORT TERM GOALS: Target date: 11/15/2023  Complete assessment of Berg and FGA and update goals and POC accordingly. Baseline:  Goal status: MET - 11/03/23  2.  Patient will be independent with initial HEP. Baseline:  Goal status: MET - 11/21/23   LONG TERM GOALS: Target date: 12/13/2023, extended to 01/10/2024   Patient will be independent with ongoing/advanced HEP for self-management at home incorporating PWR! Moves as indicated.  Baseline:  Goal status: IN PROGRESS - 12/13/23 - Patient reports no concerns or issues with flexibility and strengthening exercises or PWR! Moves but still feels unsure of balance related activities  2.  Patient will be able to ambulate 600' w/o AD with good safety to access community.  Baseline:  Goal status: MET - 12/13/23   3.  Patient will be able to step up/down curb safely with LRAD for safety with community ambulation.  Baseline:  Goal status: MET - 12/13/23   4.  Patient will report 25-50%  reduction in L-sided neck/thoracic, scapular and shoulder pain to improve activity tolerance for mobility and ambulation.  Baseline: Goal status: MET - 12/05/23 - Pt reports 50% improvement with majority of benefit from Botox injections  5.  Patient will demonstrate gait speed of >/= 3.5 ft/sec to be a safe community ambulator with decreased risk for recurrent falls.  Baseline: 2.97 ft/sec Goal status: IN PROGRESS - 12/05/23 - 3.45 ft/sec   6.  Patient will demonstrate at least 24/30 on FGA to improve gait stability and reduce risk for falls. (MCID = 4 points) Baseline: 15/30 (11/03/23) Goal status: IN PROGRESS - 12/05/23 - 22/30  7.  Patient will improve Berg score to >/= 52/56 to improve  safety and stability with ADLs in standing and reduce risk for falls. (MCID = 8 points)   Baseline: 46/56 (11/03/23) Goal status: MET - 12/05/23 - 52/56   8.  Patient will report >/= 80% on ABC scale to demonstrate improved balance confidence and decreased risk for falls. Baseline: 64.4% Goal status: MET - 12/13/23 - 1290 / 1600 = 80.6 %  9. Patient will verbalize understanding of local Parkinson's disease community resources, including community fitness post d/c. Baseline:  Goal status: IN PROGRESS - 12/13/23 - patient has not been able to join any of the groups/meetings due to conflicting MD and other appointments, but states she still receives the monthly Power over [employer] and she has been invited to join a Parkinson's exercise group at a local church   PLAN:  PT FREQUENCY: 1x/week  PT DURATION: 4 weeks  PLANNED INTERVENTIONS: 97164- PT Re-evaluation, 97110-Therapeutic exercises, 97530- Therapeutic activity, 97112- Neuromuscular re-education, 97535- Self Care, [id number]- Manual therapy, [id number]- Gait training, 97014- Electrical stimulation (unattended), [id number]- Electrical stimulation (manual), [id number]- Ultrasound, [id number]- Ionotophoresis 4mg /ml Dexamethasone, Patient/Family education, Balance training,  Stair training, Taping, Dry Needling, Joint mobilization, Spinal mobilization, Cryotherapy, and Moist heat  PLAN FOR NEXT SESSION:  progress balance and dynamic stepping activities; review and progression of PWR! Moves/patterns as tolerated; core/LE and postural strengthening; MT +/- DN to address abnormal muscle tension and TPs   Marry Guan, PT 12/19/2023, 4:45 PM

## 2023-12-26 ENCOUNTER — Ambulatory Visit: Payer: Medicare Other | Admitting: Physical Therapy

## 2023-12-26 ENCOUNTER — Encounter: Payer: Self-pay | Admitting: Physical Therapy

## 2023-12-26 DIAGNOSIS — R2689 Other abnormalities of gait and mobility: Secondary | ICD-10-CM

## 2023-12-26 DIAGNOSIS — M546 Pain in thoracic spine: Secondary | ICD-10-CM

## 2023-12-26 DIAGNOSIS — R293 Abnormal posture: Secondary | ICD-10-CM

## 2023-12-26 DIAGNOSIS — R2681 Unsteadiness on feet: Secondary | ICD-10-CM | POA: Diagnosis not present

## 2023-12-26 DIAGNOSIS — M542 Cervicalgia: Secondary | ICD-10-CM

## 2023-12-26 NOTE — Therapy (Signed)
 OUTPATIENT PHYSICAL THERAPY TREATMENT     Patient Name: Stephanie [name] MRN: [id number] DOB:November 08, 1932, 89 y.o., female Today's Date: 12/26/2023   END OF SESSION:  PT End of Session - 12/26/23 1315     Visit Number 11    Date for PT Re-Evaluation 01/10/24    Authorization Type Medicare & BCBS    Progress Note Due on Visit --   Recert/PN on visit #9 - 12/13/23   PT Start Time 1315    PT Stop Time 1357    PT Time Calculation (min) 42 min    Activity Tolerance Patient tolerated treatment well    Behavior During Therapy [facility] for tasks assessed/performed                  Past Medical History:  Diagnosis Date   Allergy    Anemia    past hx    Arthropathy of cervical spine 11/27/2012   Right  Xray at [facility]  On 04/08/2016  AP, lateral, and lateral flexion and extension views of the cervical spine are submitted for evaluation.   No acute fracture identified in the cervical spine.   There is redemonstration of approximately 2 mm of C3 on C4 anterolisthesis in neutral which slightly increases in flexion relative to neutral and extension. Slight C5 on C6 retrolisthesis does not change i   Benign hypertension without congestive heart failure    Benign mole    right hcets mole, bleeds when dried with towel   Bilateral dry eyes    Bladder cancer (HCC) Nov 01, 2016   CAD (coronary artery disease), native coronary artery 04/2023   Coronary CTA showed a coronary calcium  score to 46 with less than 25% left main, 25 to 49% proximal LAD, less than 25% left circumflex stenosis   Cataract    Cervical spondylosis    Cervicalgia    2 screws in place    Chronic kidney disease    Depression with anxiety Nov 01, 2016   prior to husbands death    Dyspnea    Gross hematuria    developed after first hip replacment, led to indicental finding of bladder tumor, bleeding now resolved    H/O total shoulder replacement, left    History of kidney stones    1970's   Hyperlipidemia    Left hand weakness     due to reinjury of flxor tendon s/p tendon surgery march 12-2018   Lumbar stenosis    Malignant tumor of urinary bladder (HCC) 07/2016   Pre-stage I   OA (osteoarthritis)    Parkinson disease [facility]) dx 2012   neurologist-  [facility] siddiqui [facility]   Psoriatic arthritis [facility])     Dr Meredith Stalls , [place] rheum    Rupture of flexor tendon of hand 2020   right   Past Surgical History:  Procedure Laterality Date   BIOPSY MASS LEFT FIRST METACARPAL   06/23/2006   benign   CATARACT EXTRACTION W/ INTRAOCULAR LENS  IMPLANT, BILATERAL  1999 and 2003   CERVICAL FUSION  02/2017   c1-c2 ; reports she has 2 screws 2in long in place done at [facility] ; patient exhibits VERY LIMITED NECK ROM   COLONOSCOPY     COSMETIC SURGERY     CYSTOSCOPY W/ RETROGRADES Bilateral 09/22/2016   Procedure: CYSTOSCOPY WITH RETROGRADE PYELOGRAM;  Surgeon: [facility] Blaze, MD;  Location: [facility];  Service: Urology;  Laterality: Bilateral;   D & C HYSTEROSCOPY W/ RESECTION POLYP  07/11/2000  DILATION AND CURETTAGE OF UTERUS  1960's   EXCISION CYST AND DEBRIDEMENT RIGHT WIRST AND REMOVAL FORGEIGN BODY  01/09/2009   EYE SURGERY     FLEXOR TENDON REPAIR Left 12/12/2018   Procedure: REPAIR/TRANSFER FLEXOR DIGITORUM PROFUNDUS OF LEFT SMALL FINGER;  Surgeon: Lyanne Sample, MD;  Location: [facility];  Service: Orthopedics;  Laterality: Left;   JOINT REPLACEMENT     LAPAROSCOPY  yrs ago   infertility    METACARPOPHALANGEAL JOINT ARTHRODESIS Right 05/25/1996   REVERSE SHOULDER ARTHROPLASTY Left 10/14/2022   Procedure: REVERSE SHOULDER ARTHROPLASTY;  Surgeon: Ellard Gunning, MD;  Location: [facility];  Service: Orthopedics;  Laterality: Left;    SPINE SURGERY     TENDON REPAIR Left 01/15/2019   Hand   TONSILLECTOMY AND ADENOIDECTOMY  child   TOTAL HIP ARTHROPLASTY Left 07/07/2016   Procedure: LEFT TOTAL HIP ARTHROPLASTY ANTERIOR APPROACH;  Surgeon: Liliane Rei, MD;  Location: [facility];   Service: Orthopedics;  Laterality: Left;   TOTAL HIP ARTHROPLASTY Right 09/28/2017   Procedure: RIGHT TOTAL HIP ARTHROPLASTY ANTERIOR APPROACH;  Surgeon: Liliane Rei, MD;  Location: [facility];  Service: Orthopedics;  Laterality: Right;   TOTAL KNEE ARTHROPLASTY Right 07/30/2019   Procedure: TOTAL KNEE ARTHROPLASTY;  Surgeon: Liliane Rei, MD;  Location: [facility];  Service: Orthopedics;  Laterality: Right;    TRANSURETHRAL RESECTION OF BLADDER TUMOR N/A 09/22/2016   Procedure: TRANSURETHRAL RESECTION OF BLADDER TUMOR (TURBT);  Surgeon: [facility] Blaze, MD;  Location: [facility];  Service: Urology;  Laterality: N/A;   Patient Active Problem List   Diagnosis Date Noted   CAD (coronary artery disease), native coronary artery 04/20/2023   S/P reverse total shoulder arthroplasty, left 10/14/2022   Preoperative cardiovascular examination 10/04/2022   SOB (shortness of breath) 08/02/2022   Insomnia 08/02/2022   Hyponatremia 08/01/2022   Shoulder pain, left 09/14/2021   History of COVID-19 01/06/2021   Diarrhea 09/03/2020   Vitamin D  deficiency 09/03/2020   OA (osteoarthritis) of knee 07/30/2019   Tinea corporis 06/08/2019   Psoriatic arthritis (HCC) 04/02/2019   Anemia 03/13/2018   Hot flashes 03/13/2018   S/P cervical spinal fusion 03/31/2017   Neck pain 12/12/2016   Depression with anxiety 10/17/2016   Bladder cancer (HCC) 10/17/2016   OA (osteoarthritis) of hip 07/07/2016   Spinal stenosis of lumbar region with radiculopathy 04/08/2016   Spondylolisthesis of lumbar region 04/08/2016   Spondylosis of cervical region without myelopathy or radiculopathy 04/08/2016   Preventative health care 01/18/2016   Low back pain 01/18/2016   History of chicken pox    Hyperlipidemia 12/15/2014   Allergic rhinitis 06/25/2014   Allergy to bee sting 06/10/2014   TMJ tenderness 11/16/2013   Other malaise and fatigue 05/07/2013   Arthropathy of cervical spine (HCC) 11/27/2012    Muscle spasms of neck 11/27/2012   Parkinson's disease (HCC) 05/24/2012   Epiretinal membrane 03/09/2012   Hyperopia with astigmatism and presbyopia 03/09/2012   Pseudophakia 03/09/2012   HTN (hypertension) 08/31/2011   Right knee pain 08/31/2011   Benign hypertensive heart disease without heart failure 05/12/2011    PCP: Neda Balk, MD   REFERRING PROVIDER: Gala Jubilee, MD   REFERRING DIAG: G20.A1 Parkinson's disease w/o dyskinesia or fluctuating manifestations  THERAPY DIAG:  Other abnormalities of gait and mobility  Unsteadiness on feet  Abnormal posture  Cervicalgia  Pain in thoracic spine  RATIONALE FOR EVALUATION AND TREATMENT: Rehabilitation  ONSET DATE: Diagnosed with PD in ~2012  NEXT MD  VISIT: 11/23/2023 - for Botox injections in neck and back   SUBJECTIVE:                                                                                                                                                                                                         SUBJECTIVE STATEMENT: Pt reports the arthritis in her L hand is bothering her more today.  She states she was able to go out to eat with friends on Sat night for the first time in >1 yr.   EVAL: Pt reports her neurologist has changed her PD meds from Sinemet  to Rytary  but they are still working on the right dosage as the current dosage leads to increased dyskinesia.  She notes more stiffness from the PD.  She notes much of her difficulty also seems to stem from her chronic neck and back pain.  She got some relief from Botox injections at 5 different places in her neck in Oct 2024 but she feels like the effects are starting to fade.  She notes more difficulty with walking related to the pain in her neck and pain - feels like she cannot stand up straight.  Pain keeps her from going to church or walking for exercise.  PAIN: Are you having pain? Yes: NPRS scale: 2-3/10 Pain location: L upper shoulder Pain  description: tightness & ache Aggravating factors: sitting up straight, standing and walking Relieving factors: sitting in recliner, heat & vibration, ice, Meloxicam , Tylenol   PERTINENT HISTORY:  Chronic neck pain, L shoulder advanced OA s/p reverse TSA on 10/14/22; R TKA 07/2019, B THR 2018 & 2017, posterior C1-2 cervical fusion 2018, rupture of flexor tendon of L hand with failed surgical repair 2020, Parkinson's, psoriatic arthritis, bladder cancer, HTN    PRECAUTIONS: Fall  RED FLAGS: None  WEIGHT BEARING RESTRICTIONS: No  FALLS:  Has patient fallen in last 6 months? No  LIVING ENVIRONMENT: Lives with: lives alone Lives in: House/apartment Stairs: Yes: External: 4 steps; on right going up, on left going up, and can reach both Has following equipment at home: Single point cane, Walker - 2 wheeled, Tour manager, bed side commode, and Grab bars  OCCUPATION: Retired  [employer]: Independent and Leisure: used to Agricultural consultant at [employer], bible study, and walk 1/3 mile daily but recently unable    PATIENT GOALS: "Be able to walk (for exercise) again and go to church."   OBJECTIVE: (objective measures completed at initial evaluation unless otherwise dated)  DIAGNOSTIC FINDINGS:  N/A  COGNITION: Overall cognitive status: Within functional limits for tasks assessed  and feels like her memory is not as good as it once was   SENSATION: WFL  COORDINATION: Heel/toe WNL. Heel to shin limited more but muscle tightness/stiffness from prior joint replacements.  MUSCLE LENGTH:  TBA as indicated Hamstrings:  ITB:  Piriformis:  Hip flexors:  Quads:  Heelcord:   POSTURE:  rounded shoulders, forward head, weight shift left, and head rests in 25 flexion & 8 R lateral flexion  CERVICAL ROM:  Head rests in 25 flexion & 8 R lateral flexion (10/18/23)  Active ROM Eval  Flexion 58  Extension 10  Right lateral flexion 20  Left lateral flexion 5  Right rotation 40  Left rotation 22    (Blank rows = not tested)  UPPER EXTREMITY ROM:  Active ROM Right eval Left eval  Shoulder flexion 140 119  Shoulder extension    Shoulder abduction 127 105  Shoulder adduction    Shoulder internal rotation    Shoulder external rotation    Elbow flexion    Elbow extension    Wrist flexion    Wrist extension    Wrist ulnar deviation    Wrist radial deviation    Wrist pronation    Wrist supination     (Blank rows = not tested)  UPPER EXTREMITY MMT:  MMT Right eval Left eval  Shoulder flexion 5 5  Shoulder extension 4 4+  Shoulder abduction 5 4+  Shoulder adduction    Shoulder internal rotation 4+ 4  Shoulder external rotation 4 4  Middle trapezius    Lower trapezius     (Blank rows = not tested)   LOWER EXTREMITY ROM:    Limited B hip IR/ER but otherwise [facility]  LOWER EXTREMITY MMT:  Tested in sitting on eval  MMT Right eval Left eval R 12/19/23 L 12/19/23  Hip flexion 4 4 5 5   Hip extension 4 4 5 5   Hip abduction 4+ 4+ 5 5  Hip adduction 4+ 4+ 5 5  Hip internal rotation 4+ 4+ 5 5  Hip external rotation 4 4 4+ 4+  Knee flexion 5 5 5 5   Knee extension 5 5 5 5   Ankle dorsiflexion 4+ 4+ 5 5  Ankle plantarflexion      Ankle inversion      Ankle eversion      (Blank rows = not tested)  BED MOBILITY:  NT  TRANSFERS: Assistive device utilized: None  Sit to stand: Complete Independence Stand to sit: Complete Independence Chair to chair: Complete Independence Floor:  NT  GAIT: Distance walked: Clinic distances Assistive device utilized: None Level of assistance: Complete Independence Gait pattern: step through pattern, decreased arm swing- Right, decreased arm swing- Left, lateral lean- Right, and decreased trunk rotation Comment: Gait speed = 2.97 ft/sec  RAMP: Level of Assistance:  NT Assistive device utilized: None Ramp Comments:   CURB:  Level of Assistance:  NT Assistive device utilized: None Curb Comments:   STAIRS:  Level of Assistance: Modified  independence  Stair Negotiation Technique: Alternating Pattern  with Single Rail on Right  Number of Stairs: 14    Height of Stairs: 7" Comments: Slower/more cautious on descent  FUNCTIONAL TESTS:  5 times sit to stand: 11.97 sec Timed up and go (TUG): Normal - 10.85 sec, Manual - 11.22 sec, Cognitive - 12.47 sec 10 meter walk test: 11.03 sec; Gait speed = 2.97 ft/sec  Berg Balance Scale: 46/56, 46-51 moderate fall risk (>50%)  (11/03/23) Functional gait assessment: 15/30, < 19 = high  risk fall (11/03/23)  12/05/23: 5xSTS:  11.00 sec TUG: Normal - 7.97 sec, Manual - 9.90 sec, Cognitive - 10.41 sec : 9.50 sec Gait speed = 3.45 ft/sec  Berg: 52/56, 52-55 lower fall risk (> 25%)  FGA: 22/30, 19-24 = medium risk fall    Baselines as of D/C from last PT episode for Parkinson's (09/2022): 5 times sit to stand: 12.31 sec Berg Balance Scale: 54/56 TUG, & FGA not assessed due to patient unable to return for final visit  Baselines as of D/C from PT (02/2022):  Timed up and go (TUG): 8.88 sec (Normal) 10 meter walk test: 8.82 sec; Gait speed: 3.72 ft/sec  Functional gait assessment: 26/30  PATIENT SURVEYS:  ABC scale 1030 / 1600 = 64.4 %   TODAY'S TREATMENT:   12/26/23 THERAPEUTIC EXERCISE: To improve strength and endurance.  Demonstration, verbal and tactile cues throughout for technique.  NuStep - L5 x 6 min (LE only)  NEUROMUSCULAR RE-EDUCATION: To improve balance, coordination, kinesthetic sense, posture, proprioception, and reduce fall risk.  CGA/SBA of PT for all activities. Corner balance progression with 1/2 tandem stance on Airex pad with arms crossed on chest            Eyes open: static stance x 30 sec bil trunk rotation x 5 (head turns/nods deferred so as to not irritate her neck) Eyes closed: static stance x 10-20 sec Corner balance progression with tandem stance on Airex pad with arms at sides and arms crossed on chest            Eyes open: static stance  x 30 sec bil in each arm position  trunk rotation x 5 Corner balance progression with right SLS and left SLS on Airex pad with arms wide            Eyes open: static stance as able (~3-10 sec) - intermittent "tip toe" support from opp LE Alt toe clears from Airex pad to 9" stool x 20 Corner balance progression with staggered stance and fwd foot on blue foam oval atop 9" stool  on complaint surface and Airex pad with arms at sides            Eyes open: static stance x 30 sec Tandem gait on foam balance beam - fwd x 4 passes - increased LOB requiring PT assistance to stabilize and prevent fall  SELF CARE: Provided education to prevent loss of gains achieved with physical therapy and prevent future decline in function.  Discussed patient's plans on how to remain active upon completion of current PT episode.  Patient indicating that she intends to use her BCBS benefit to cover the cost of an exercise group that her husband used to attend for his Parkinson's.  She denies need for review of her current PT HEP for PWR! Moves at this time.   12/19/23 THERAPEUTIC EXERCISE: to improve flexibility, strength and mobility.  Demonstration, verbal and tactile cues throughout for technique.  NuStep - L5 x 6 min (LE only)  LE MMT  NEUROMUSCULAR RE-EDUCATION: To improve balance, proprioception, coordination, and reduce fall risk. SBA/CGA of PT for all activities.    R/L SLS 3 x 10-15" (duration as able) R/L SLS + UE side-side reach to 2 cones on counter R/L SLS + 3-way toe tap to 3 balance pebbles placed anterior, lateral and posterior x 10 each R/L SLS + cone knock-down and right with opp foot x 6 cone (side-step-between cones) Braiding 2 x 10 ft bilateral with back  6" from wall [place]vine 2 x 10 ft bilateral with back 6" from wall Tandem gait fwd & back w/in arms reach of wall 2 x 10 ft   12/13/23 THERAPEUTIC EXERCISE: to improve flexibility, strength and mobility.  Demonstration, verbal and tactile cues  throughout for technique.  NuStep - L5 x 6 min (LE only)  THERAPEUTIC ACTIVITIES: ABC scale:  1290 / 1600 = 80.6 % Goal assessment  GAIT TRAINING: To improve safety with community ambulation . 600 ft w/o AD independent demonstrating good reciprocal arm swing, good foot clearance with good heel strike and good heel-toe progression; she still demonstrates slight R trunk/head lean with fwd flexed head due to fixed postural abnormalities Up/down 6" curb w/o AD independent  NEUROMUSCULAR RE-EDUCATION: To improve balance, proprioception, coordination, and reduce fall risk.  Corner balance progression with 3/4 tandem stance on firm surface and Airex pad with arms at sides and arms crossed on chest.            Eyes open: static stance x 30 sec horiz head turns x 5 vertical head nods x 5 trunk rotation x 5 Eyes closed: static stance x 20 sec   PATIENT EDUCATION:  Education details: continue with current HEP  Person educated: Patient Education method: Explanation Education comprehension: verbalized understanding  HOME EXERCISE PROGRAM: Access Code: [email or web address] URL: [email or web address] Date: 11/29/2023 Prepared by: Felecia Hopper  Exercises - Seated Hamstring Stretch  - 2 x daily - 7 x weekly - 3 reps - 30 sec hold - Seated Hip Abduction with Resistance  - 1 x daily - 3-4 x weekly - 2 sets - 10 reps - 3-5 sec hold - Seated March with Resistance  - 1 x daily - 3-4 x weekly - 2 sets - 10 reps - 3 sec hold - Sit to Stand with Resistance Around Legs  - 1 x daily - 3-4 x weekly - 2 sets - 10 reps - Seated Shoulder Setting  - 1 x daily - 7 x weekly - 2 sets - 10 reps - 3 sec hold - Standing Bilateral Low Shoulder Row with Anchored Resistance  - 1 x daily - 3-4 x weekly - 2 sets - 10 reps - 5 sec hold - Scapular Retraction with Resistance Advanced  - 1 x daily - 3-4 x weekly - 2 sets - 10 reps - 5 sec hold - Marching with Resistance  - 1 x daily - 3 x weekly - 2 sets - 10 reps - 3  sec hold - Standing Hip Extension with Resistance at Ankles and Counter Support  - 1 x daily - 3 x weekly - 2 sets - 10 reps - 3 sec hold - Semi-Tandem Corner Balance With Eyes Open  - 1 x daily - 3 x weekly - 2 sets - 10 reps - 3 sec hold - Semi-Tandem Corner Balance: Eyes Open With Head Turns  - 1 x daily - 3 x weekly - 2 sets - 5 reps  PWR! Moves: - Sitting - Standing - Supine   ASSESSMENT:  CLINICAL IMPRESSION: Meral reports she was able to go out to eat for the first time in >1 yr on Saturday night and tolerated the evening well.  She reports she is still doing well with the HEP with no issues or need for review at this time.  Progressed corner balance activities with addition of compliant surface with improving stability noted although continued difficulty with SLS.  Progressed tandem stance activities to tandem gait on compliant  surface with increased LOB noted, therefore we will continue to address this in upcoming visits.  Intermittent seated rest breaks necessary due to fatigue.  Discussed patient's plans for continued exercise/activity upon completion of current POC with patient noting she plans to look into using her BCBS benefits to cover an exercise class geared towards Parkinson's at a location where her husband used to go for his Parkinson's.  Gennette will benefit from continued skilled PT to address ongoing balance and gait deficits to improve mobility and activity tolerance with decreased pain interference and reduced risk for falls.   OBJECTIVE IMPAIRMENTS: Abnormal gait, cardiopulmonary status limiting activity, decreased activity tolerance, decreased balance, decreased coordination, decreased endurance, decreased knowledge of condition, decreased mobility, difficulty walking, decreased ROM, decreased strength, hypomobility, increased fascial restrictions, impaired perceived functional ability, increased muscle spasms, impaired flexibility, improper body mechanics, postural  dysfunction, and pain.   ACTIVITY LIMITATIONS: carrying, lifting, bending, sitting, standing, squatting, sleeping, stairs, reach over head, and locomotion level  PARTICIPATION LIMITATIONS: meal prep, cleaning, laundry, driving, shopping, community activity, and church  PERSONAL FACTORS: Age, Past/current experiences, Time since onset of injury/illness/exacerbation, and 3+ comorbidities: Chronic neck pain, L shoulder advanced OA s/p reverse TSA on 10/14/22; R TKA 07/2019, B THR 2018 & 2017, posterior C1-2 cervical fusion 2018, rupture of flexor tendon of L hand with failed surgical repair 2020, Parkinson's, psoriatic arthritis, bladder cancer, HTN  are also affecting patient's functional outcome.   REHAB POTENTIAL: Good  CLINICAL DECISION MAKING: Evolving/moderate complexity  EVALUATION COMPLEXITY: Moderate   GOALS: Goals reviewed with patient? Yes  SHORT TERM GOALS: Target date: 11/15/2023  Complete assessment of Berg and FGA and update goals and POC accordingly. Baseline:  Goal status: MET - 11/03/23  2.  Patient will be independent with initial HEP. Baseline:  Goal status: MET - 11/21/23   LONG TERM GOALS: Target date: 12/13/2023, extended to 01/10/2024   Patient will be independent with ongoing/advanced HEP for self-management at home incorporating PWR! Moves as indicated.  Baseline:  Goal status: IN PROGRESS - 12/13/23 - Patient reports no concerns or issues with flexibility and strengthening exercises or PWR! Moves but still feels unsure of balance related activities  2.  Patient will be able to ambulate 600' w/o AD with good safety to access community.  Baseline:  Goal status: MET - 12/13/23   3.  Patient will be able to step up/down curb safely with LRAD for safety with community ambulation.  Baseline:  Goal status: MET - 12/13/23   4.  Patient will report 25-50% reduction in L-sided neck/thoracic, scapular and shoulder pain to improve activity tolerance for mobility and  ambulation.  Baseline: Goal status: MET - 12/05/23 - Pt reports 50% improvement with majority of benefit from Botox injections  5.  Patient will demonstrate gait speed of >/= 3.5 ft/sec to be a safe community ambulator with decreased risk for recurrent falls.  Baseline: 2.97 ft/sec Goal status: IN PROGRESS - 12/05/23 - 3.45 ft/sec   6.  Patient will demonstrate at least 24/30 on FGA to improve gait stability and reduce risk for falls. (MCID = 4 points) Baseline: 15/30 (11/03/23) Goal status: IN PROGRESS - 12/05/23 - 22/30  7.  Patient will improve Berg score to >/= 52/56 to improve safety and stability with ADLs in standing and reduce risk for falls. (MCID = 8 points)   Baseline: 46/56 (11/03/23) Goal status: MET - 12/05/23 - 52/56   8.  Patient will report >/= 80% on ABC scale to demonstrate improved  balance confidence and decreased risk for falls. Baseline: 64.4% Goal status: MET - 12/13/23 - 1290 / 1600 = 80.6 %  9. Patient will verbalize understanding of local Parkinson's disease community resources, including community fitness post d/c. Baseline:  Goal status: IN PROGRESS - 12/26/23 - patient reports she plans on looking into an exercise class that will be covered by her BCBS insurance   PLAN:  PT FREQUENCY: 1x/week  PT DURATION: 4 weeks  PLANNED INTERVENTIONS: 97164- PT Re-evaluation, 97110-Therapeutic exercises, 97530- Therapeutic activity, 97112- Neuromuscular re-education, 97535- Self Care, 82956- Manual therapy, [id number]- Gait training, 97014- Electrical stimulation (unattended), [id number]- Electrical stimulation (manual), [id number]- Ultrasound, 21308- Ionotophoresis 4mg /ml Dexamethasone , Patient/Family education, Balance training, Stair training, Taping, Dry Needling, Joint mobilization, Spinal mobilization, Cryotherapy, and Moist heat  PLAN FOR NEXT SESSION:  progress balance and dynamic stepping activities; review and progression of PWR! Moves/patterns as tolerated; core/LE and postural  strengthening; MT +/- DN to address abnormal muscle tension and TPs   Francisco Irving, PT 12/26/2023, 7:03 PM

## 2023-12-27 ENCOUNTER — Telehealth: Payer: Medicare Other | Admitting: Family Medicine

## 2023-12-27 DIAGNOSIS — H35351 Cystoid macular degeneration, right eye: Secondary | ICD-10-CM | POA: Diagnosis not present

## 2023-12-27 DIAGNOSIS — H35371 Puckering of macula, right eye: Secondary | ICD-10-CM | POA: Diagnosis not present

## 2023-12-28 DIAGNOSIS — L408 Other psoriasis: Secondary | ICD-10-CM | POA: Diagnosis not present

## 2023-12-28 DIAGNOSIS — L578 Other skin changes due to chronic exposure to nonionizing radiation: Secondary | ICD-10-CM | POA: Diagnosis not present

## 2023-12-28 DIAGNOSIS — D2272 Melanocytic nevi of left lower limb, including hip: Secondary | ICD-10-CM | POA: Diagnosis not present

## 2023-12-28 DIAGNOSIS — D2271 Melanocytic nevi of right lower limb, including hip: Secondary | ICD-10-CM | POA: Diagnosis not present

## 2023-12-28 DIAGNOSIS — L821 Other seborrheic keratosis: Secondary | ICD-10-CM | POA: Diagnosis not present

## 2023-12-28 DIAGNOSIS — D225 Melanocytic nevi of trunk: Secondary | ICD-10-CM | POA: Diagnosis not present

## 2023-12-29 DIAGNOSIS — Z79899 Other long term (current) drug therapy: Secondary | ICD-10-CM | POA: Diagnosis not present

## 2023-12-29 DIAGNOSIS — L4050 Arthropathic psoriasis, unspecified: Secondary | ICD-10-CM | POA: Diagnosis not present

## 2023-12-29 DIAGNOSIS — R5383 Other fatigue: Secondary | ICD-10-CM | POA: Diagnosis not present

## 2024-01-01 NOTE — Assessment & Plan Note (Signed)
Mild and asymptomatic. Continue water restriction and can be generous with salt intake

## 2024-01-01 NOTE — Assessment & Plan Note (Signed)
 Tolerating statin, encouraged heart healthy diet, avoid trans fats, minimize simple carbs and saturated fats. Increase exercise as tolerated

## 2024-01-01 NOTE — Assessment & Plan Note (Signed)
 Supplement and monitor

## 2024-01-01 NOTE — Assessment & Plan Note (Signed)
Following with neurology 

## 2024-01-01 NOTE — Assessment & Plan Note (Signed)
 Well controlled, no changes to meds. Encouraged heart healthy diet such as the DASH diet and exercise as tolerated.

## 2024-01-02 ENCOUNTER — Ambulatory Visit: Payer: Medicare Other | Admitting: Physical Therapy

## 2024-01-02 ENCOUNTER — Encounter: Payer: Self-pay | Admitting: Physical Therapy

## 2024-01-02 DIAGNOSIS — M546 Pain in thoracic spine: Secondary | ICD-10-CM

## 2024-01-02 DIAGNOSIS — M542 Cervicalgia: Secondary | ICD-10-CM

## 2024-01-02 DIAGNOSIS — R293 Abnormal posture: Secondary | ICD-10-CM | POA: Diagnosis not present

## 2024-01-02 DIAGNOSIS — R2681 Unsteadiness on feet: Secondary | ICD-10-CM

## 2024-01-02 DIAGNOSIS — R2689 Other abnormalities of gait and mobility: Secondary | ICD-10-CM

## 2024-01-02 NOTE — Therapy (Addendum)
 OUTPATIENT PHYSICAL THERAPY TREATMENT     Patient Name: Stephanie [name] MRN: [id number] DOB:[birth year], 89 y.o., female Today's Date: 01/02/2024   END OF SESSION:  PT End of Session - 01/02/24 1449     Visit Number 12    Date for PT Re-Evaluation 01/10/24    Authorization Type Medicare & BCBS    Progress Note Due on Visit --   Recert/PN on visit #9 - 12/13/23   PT Start Time 1449    PT Stop Time 1529    PT Time Calculation (min) 40 min    Activity Tolerance Patient tolerated treatment well    Behavior During Therapy [facility] for tasks assessed/performed                   Past Medical History:  Diagnosis Date   Allergy    Anemia    past hx    Arthropathy of cervical spine 11/27/2012   Right  Xray at [facility]  On 04/08/2016  AP, lateral, and lateral flexion and extension views of the cervical spine are submitted for evaluation.   No acute fracture identified in the cervical spine.   There is redemonstration of approximately 2 mm of C3 on C4 anterolisthesis in neutral which slightly increases in flexion relative to neutral and extension. Slight C5 on C6 retrolisthesis does not change i   Benign hypertension without congestive heart failure    Benign mole    right hcets mole, bleeds when dried with towel   Bilateral dry eyes    Bladder cancer (HCC) 19-Oct-2016   CAD (coronary artery disease), native coronary artery 04/2023   Coronary CTA showed a coronary calcium score to 46 with less than 25% left main, 25 to 49% proximal LAD, less than 25% left circumflex stenosis   Cataract    Cervical spondylosis    Cervicalgia    2 screws in place    Chronic kidney disease    Depression with anxiety 2016-10-19   prior to husbands death    Dyspnea    Gross hematuria    developed after first hip replacment, led to indicental finding of bladder tumor, bleeding now resolved    H/O total shoulder replacement, left    History of kidney stones    1970's   Hyperlipidemia    Left hand weakness     due to reinjury of flxor tendon s/p tendon surgery march 12-2018   Lumbar stenosis    Malignant tumor of urinary bladder (HCC) 07/2016   Pre-stage I   OA (osteoarthritis)    Parkinson disease [facility]) dx 2012   neurologist-  dr siddiqui at [facility]   Psoriatic arthritis [facility])     Dr Nickola Major , [place] [facility]    Rupture of flexor tendon of hand 2020   right   Past Surgical History:  Procedure Laterality Date   BIOPSY MASS LEFT FIRST METACARPAL   06/23/2006   benign   CATARACT EXTRACTION W/ INTRAOCULAR LENS  IMPLANT, BILATERAL  1999 and 2003   CERVICAL FUSION  02/2017   c1-c2 ; reports she has 2 screws 2in long in place done at [facility] ; patient exhibits VERY LIMITED NECK ROM   COLONOSCOPY     COSMETIC SURGERY     CYSTOSCOPY W/ RETROGRADES Bilateral 09/22/2016   Procedure: CYSTOSCOPY WITH RETROGRADE PYELOGRAM;  Surgeon: Sebastian Ache, MD;  Location: [facility];  Service: Urology;  Laterality: Bilateral;   D & C HYSTEROSCOPY W/ RESECTION POLYP  07/11/2000  DILATION AND CURETTAGE OF UTERUS  1960's   EXCISION CYST AND DEBRIDEMENT RIGHT WIRST AND REMOVAL FORGEIGN BODY  01/09/2009   EYE SURGERY     FLEXOR TENDON REPAIR Left 12/12/2018   Procedure: REPAIR/TRANSFER FLEXOR DIGITORUM PROFUNDUS OF LEFT SMALL FINGER;  Surgeon: Cindee Salt, MD;  Location: [facility];  Service: Orthopedics;  Laterality: Left;   JOINT REPLACEMENT     LAPAROSCOPY  yrs ago   infertility    METACARPOPHALANGEAL JOINT ARTHRODESIS Right 05/25/1996   REVERSE SHOULDER ARTHROPLASTY Left 10/14/2022   Procedure: REVERSE SHOULDER ARTHROPLASTY;  Surgeon: Francena Hanly, MD;  Location: [facility];  Service: Orthopedics;  Laterality: Left;    SPINE SURGERY     TENDON REPAIR Left 01/15/2019   Hand   TONSILLECTOMY AND ADENOIDECTOMY  child   TOTAL HIP ARTHROPLASTY Left 07/07/2016   Procedure: LEFT TOTAL HIP ARTHROPLASTY ANTERIOR APPROACH;  Surgeon: Ollen Gross, MD;  Location: [facility];   Service: Orthopedics;  Laterality: Left;   TOTAL HIP ARTHROPLASTY Right 09/28/2017   Procedure: RIGHT TOTAL HIP ARTHROPLASTY ANTERIOR APPROACH;  Surgeon: Ollen Gross, MD;  Location: [facility];  Service: Orthopedics;  Laterality: Right;   TOTAL KNEE ARTHROPLASTY Right 07/30/2019   Procedure: TOTAL KNEE ARTHROPLASTY;  Surgeon: Ollen Gross, MD;  Location: [facility];  Service: Orthopedics;  Laterality: Right;    TRANSURETHRAL RESECTION OF BLADDER TUMOR N/A 09/22/2016   Procedure: TRANSURETHRAL RESECTION OF BLADDER TUMOR (TURBT);  Surgeon: Sebastian Ache, MD;  Location: [facility];  Service: Urology;  Laterality: N/A;   Patient Active Problem List   Diagnosis Date Noted   CAD (coronary artery disease), native coronary artery 04/20/2023   S/P reverse total shoulder arthroplasty, left 10/14/2022   Preoperative cardiovascular examination 10/04/2022   SOB (shortness of breath) 08/02/2022   Insomnia 08/02/2022   Hyponatremia 08/01/2022   Shoulder pain, left 09/14/2021   History of COVID-19 01/06/2021   Diarrhea 09/03/2020   Vitamin D deficiency 09/03/2020   OA (osteoarthritis) of knee 07/30/2019   Tinea corporis 06/08/2019   Psoriatic arthritis (HCC) 04/02/2019   Anemia 03/13/2018   Hot flashes 03/13/2018   S/P cervical spinal fusion 03/31/2017   Neck pain 12/12/2016   Depression with anxiety 10/17/2016   Bladder cancer (HCC) 10/17/2016   OA (osteoarthritis) of hip 07/07/2016   Spinal stenosis of lumbar region with radiculopathy 04/08/2016   Spondylolisthesis of lumbar region 04/08/2016   Spondylosis of cervical region without myelopathy or radiculopathy 04/08/2016   Preventative health care 01/18/2016   Low back pain 01/18/2016   History of chicken pox    Hyperlipidemia 12/15/2014   Allergic rhinitis 06/25/2014   Allergy to bee sting 06/10/2014   TMJ tenderness 11/16/2013   Other malaise and fatigue 05/07/2013   Arthropathy of cervical spine (HCC) 11/27/2012    Muscle spasms of neck 11/27/2012   Parkinson's disease (HCC) 05/24/2012   Epiretinal membrane 03/09/2012   Hyperopia with astigmatism and presbyopia 03/09/2012   Pseudophakia 03/09/2012   HTN (hypertension) 08/31/2011   Right knee pain 08/31/2011   Benign hypertensive heart disease without heart failure 05/12/2011    PCP: Bradd Canary, MD   REFERRING PROVIDER: Jaquita Folds, MD   REFERRING DIAG: G20.A1 Parkinson's disease w/o dyskinesia or fluctuating manifestations  THERAPY DIAG:  Other abnormalities of gait and mobility  Unsteadiness on feet  Abnormal posture  Cervicalgia  Pain in thoracic spine  RATIONALE FOR EVALUATION AND TREATMENT: Rehabilitation  ONSET DATE: Diagnosed with PD in ~2012  NEXT MD  VISIT: 11/23/2023 - for Botox injections in neck and back   SUBJECTIVE:                                                                                                                                                                                                         SUBJECTIVE STATEMENT: Pt reports she has not noted much change since her infusion last week - her arthritis in her hands still really bothers her and her L shoulder has started giving her sharp pains again with certain motions.  EVAL: Pt reports her neurologist has changed her PD meds from Sinemet to Rytary but they are still working on the right dosage as the current dosage leads to increased dyskinesia.  She notes more stiffness from the PD.  She notes much of her difficulty also seems to stem from her chronic neck and back pain.  She got some relief from Botox injections at 5 different places in her neck in Oct 2024 but she feels like the effects are starting to fade.  She notes more difficulty with walking related to the pain in her neck and pain - feels like she cannot stand up straight.  Pain keeps her from going to church or walking for exercise.  PAIN: Are you having pain? Yes: NPRS scale: 0/10  currently; up to 7/10 with certain motions Pain location: L shoulder Pain description: tightness & ache Aggravating factors: sitting up straight, standing and walking Relieving factors: sitting in recliner, heat & vibration, ice, Meloxicam, Tylenol  PERTINENT HISTORY:  Chronic neck pain, L shoulder advanced OA s/p reverse TSA on 10/14/22; R TKA 07/2019, B THR 2018 & 2017, posterior C1-2 cervical fusion 2018, rupture of flexor tendon of L hand with failed surgical repair 2020, Parkinson's, psoriatic arthritis, bladder cancer, HTN    PRECAUTIONS: Fall  RED FLAGS: None  WEIGHT BEARING RESTRICTIONS: No  FALLS:  Has patient fallen in last 6 months? No  LIVING ENVIRONMENT: Lives with: lives alone Lives in: House/apartment Stairs: Yes: External: 4 steps; on right going up, on left going up, and can reach both Has following equipment at home: Single point cane, Walker - 2 wheeled, Tour manager, bed side commode, and Grab bars  OCCUPATION: Retired  [employer]: Independent and Leisure: used to Agricultural consultant at [employer], bible study, and walk 1/3 mile daily but recently unable    PATIENT GOALS: "Be able to walk (for exercise) again and go to church."   OBJECTIVE: (objective measures completed at initial evaluation unless otherwise dated)  DIAGNOSTIC FINDINGS:  N/A  COGNITION: Overall cognitive status:  Within functional limits for tasks assessed and feels like her memory is not as good as it once was   SENSATION: WFL  COORDINATION: Heel/toe WNL. Heel to shin limited more but muscle tightness/stiffness from prior joint replacements.  MUSCLE LENGTH:  TBA as indicated Hamstrings:  ITB:  Piriformis:  Hip flexors:  Quads:  Heelcord:   POSTURE:  rounded shoulders, forward head, weight shift left, and head rests in 25 flexion & 8 R lateral flexion  CERVICAL ROM:  Head rests in 25 flexion & 8 R lateral flexion (10/18/23)  Active ROM Eval  Flexion 58  Extension 10  Right lateral  flexion 20  Left lateral flexion 5  Right rotation 40  Left rotation 22   (Blank rows = not tested)  UPPER EXTREMITY ROM:  Active ROM Right eval Left eval  Shoulder flexion 140 119  Shoulder extension    Shoulder abduction 127 105  Shoulder adduction    Shoulder internal rotation    Shoulder external rotation    Elbow flexion    Elbow extension    Wrist flexion    Wrist extension    Wrist ulnar deviation    Wrist radial deviation    Wrist pronation    Wrist supination     (Blank rows = not tested)  UPPER EXTREMITY MMT:  MMT Right eval Left eval  Shoulder flexion 5 5  Shoulder extension 4 4+  Shoulder abduction 5 4+  Shoulder adduction    Shoulder internal rotation 4+ 4  Shoulder external rotation 4 4  Middle trapezius    Lower trapezius     (Blank rows = not tested)   LOWER EXTREMITY ROM:    Limited B hip IR/ER but otherwise [facility]  LOWER EXTREMITY MMT:  Tested in sitting on eval  MMT Right eval Left eval R 12/19/23 L 12/19/23  Hip flexion 4 4 5 5   Hip extension 4 4 5 5   Hip abduction 4+ 4+ 5 5  Hip adduction 4+ 4+ 5 5  Hip internal rotation 4+ 4+ 5 5  Hip external rotation 4 4 4+ 4+  Knee flexion 5 5 5 5   Knee extension 5 5 5 5   Ankle dorsiflexion 4+ 4+ 5 5  Ankle plantarflexion      Ankle inversion      Ankle eversion      (Blank rows = not tested)  BED MOBILITY:  NT  TRANSFERS: Assistive device utilized: None  Sit to stand: Complete Independence Stand to sit: Complete Independence Chair to chair: Complete Independence Floor:  NT  GAIT: Distance walked: Clinic distances Assistive device utilized: None Level of assistance: Complete Independence Gait pattern: step through pattern, decreased arm swing- Right, decreased arm swing- Left, lateral lean- Right, and decreased trunk rotation Comment: Gait speed = 2.97 ft/sec  RAMP: Level of Assistance:  NT Assistive device utilized: None Ramp Comments:   CURB:  Level of Assistance:  NT Assistive  device utilized: None Curb Comments:   STAIRS:  Level of Assistance: Modified independence  Stair Negotiation Technique: Alternating Pattern  with Single Rail on Right  Number of Stairs: 14    Height of Stairs: 7" Comments: Slower/more cautious on descent  FUNCTIONAL TESTS:  5 times sit to stand: 11.97 sec Timed up and go (TUG): Normal - 10.85 sec, Manual - 11.22 sec, Cognitive - 12.47 sec 10 meter walk test: 11.03 sec; Gait speed = 2.97 ft/sec  Berg Balance Scale: 46/56, 46-51 moderate fall risk (>50%)  (11/03/23) Functional gait  assessment: 15/30, < 19 = high risk fall (11/03/23)  12/05/23: 5xSTS:  11.00 sec TUG: Normal - 7.97 sec, Manual - 9.90 sec, Cognitive - 10.41 sec : 9.50 sec Gait speed = 3.45 ft/sec  Berg: 52/56, 52-55 lower fall risk (> 25%)  FGA: 22/30, 19-24 = medium risk fall   01/02/24:  10 MWT = 9.82 sec Gait speed = 3.34 ft/sec FGA = 24/30; scores of 19-24 indicate a medium fall risk   Baselines as of D/C from last PT episode for Parkinson's (09/2022): 5 times sit to stand: 12.31 sec Berg Balance Scale: 54/56 TUG, & FGA not assessed due to patient unable to return for final visit  Baselines as of D/C from PT (02/2022):  Timed up and go (TUG): 8.88 sec (Normal) 10 meter walk test: 8.82 sec; Gait speed: 3.72 ft/sec  Functional gait assessment: 26/30  PATIENT SURVEYS:  ABC scale 1030 / 1600 = 64.4 %   TODAY'S TREATMENT:   01/02/24 THERAPEUTIC EXERCISE: To improve strength and endurance.  Demonstration, verbal and tactile cues throughout for technique.  Rec Bike - L7 x 6 min UE strengthening with 1# cuff weights: L elbow flexion  L shoulder flexion  L shoulder scaption  PHYSICAL PERFORMANCE TEST or MEASUREMENT: 10 MWT = 9.82 sec Gait speed = 3.34 ft/sec FGA = 24/30; scores of 19-24 indicate a medium fall risk Functional Gait  Assessment  Gait Level Surface Walks 20 ft in less than 5.5 sec, no assistive devices, good speed, no evidence  for imbalance, normal gait pattern, deviates no more than 6 in outside of the 12 in walkway width.   Change in Gait Speed Able to smoothly change walking speed without loss of balance or gait deviation. Deviate no more than 6 in outside of the 12 in walkway width.   Gait with Horizontal Head Turns Performs head turns smoothly with slight change in gait velocity (eg, minor disruption to smooth gait path), deviates 6-10 in outside 12 in walkway width, or uses an assistive device.   Gait with Vertical Head Turns Performs head turns with no change in gait. Deviates no more than 6 in outside 12 in walkway width.   Gait and Pivot Turn Pivot turns safely within 3 sec and stops quickly with no loss of balance.   Step Over Obstacle Is able to step over 2 stacked shoe boxes taped together (9 in total height) without changing gait speed. No evidence of imbalance.   Gait with Narrow Base of Support Ambulates 7-9 steps.   Gait with Eyes Closed Walks 20 ft, slow speed, abnormal gait pattern, evidence for imbalance, deviates 10-15 in outside 12 in walkway width. Requires more than 9 sec to ambulate 20 ft.   Ambulating Backwards Walks 20 ft, uses assistive device, slower speed, mild gait deviations, deviates 6-10 in outside 12 in walkway width.   Steps Alternating feet, must use rail.   Total Score 24   FGA comment: 19-24 = medium risk fall       12/26/23 THERAPEUTIC EXERCISE: To improve strength and endurance.  Demonstration, verbal and tactile cues throughout for technique.  NuStep - L5 x 6 min (LE only)  NEUROMUSCULAR RE-EDUCATION: To improve balance, coordination, kinesthetic sense, posture, proprioception, and reduce fall risk.  CGA/SBA of PT for all activities. Corner balance progression with 1/2 tandem stance on Airex pad with arms crossed on chest            Eyes open: static stance x 30 sec bil trunk  rotation x 5 (head turns/nods deferred so as to not irritate her neck) Eyes closed: static stance x  10-20 sec Corner balance progression with tandem stance on Airex pad with arms at sides and arms crossed on chest            Eyes open: static stance x 30 sec bil in each arm position  trunk rotation x 5 Corner balance progression with right SLS and left SLS on Airex pad with arms wide            Eyes open: static stance as able (~3-10 sec) - intermittent "tip toe" support from opp LE Alt toe clears from Airex pad to 9" stool x 20 Corner balance progression with staggered stance and fwd foot on blue foam oval atop 9" stool  on complaint surface and Airex pad with arms at sides            Eyes open: static stance x 30 sec Tandem gait on foam balance beam - fwd x 4 passes - increased LOB requiring PT assistance to stabilize and prevent fall  SELF CARE: Provided education to prevent loss of gains achieved with physical therapy and prevent future decline in function.  Discussed patient's plans on how to remain active upon completion of current PT episode.  Patient indicating that she intends to use her BCBS benefit to cover the cost of an exercise group that her husband used to attend for his Parkinson's.  She denies need for review of her current PT HEP for PWR! Moves at this time.   12/19/23 THERAPEUTIC EXERCISE: to improve flexibility, strength and mobility.  Demonstration, verbal and tactile cues throughout for technique.  NuStep - L5 x 6 min (LE only)  LE MMT  NEUROMUSCULAR RE-EDUCATION: To improve balance, proprioception, coordination, and reduce fall risk. SBA/CGA of PT for all activities.    R/L SLS 3 x 10-15" (duration as able) R/L SLS + UE side-side reach to 2 cones on counter R/L SLS + 3-way toe tap to 3 balance pebbles placed anterior, lateral and posterior x 10 each R/L SLS + cone knock-down and right with opp foot x 6 cone (side-step-between cones) Braiding 2 x 10 ft bilateral with back 6" from wall Grapevine 2 x 10 ft bilateral with back 6" from wall Tandem gait fwd & back  w/in arms reach of wall 2 x 10 ft   PATIENT EDUCATION:  Education details: continue with current HEP  Person educated: Patient Education method: Explanation Education comprehension: verbalized understanding  HOME EXERCISE PROGRAM: Access Code: [email or web address] URL: [email or web address] Date: 11/29/2023 Prepared by: Glenetta Hew  Exercises - Seated Hamstring Stretch  - 2 x daily - 7 x weekly - 3 reps - 30 sec hold - Seated Hip Abduction with Resistance  - 1 x daily - 3-4 x weekly - 2 sets - 10 reps - 3-5 sec hold - Seated March with Resistance  - 1 x daily - 3-4 x weekly - 2 sets - 10 reps - 3 sec hold - Sit to Stand with Resistance Around Legs  - 1 x daily - 3-4 x weekly - 2 sets - 10 reps - Seated Shoulder Setting  - 1 x daily - 7 x weekly - 2 sets - 10 reps - 3 sec hold - Standing Bilateral Low Shoulder Row with Anchored Resistance  - 1 x daily - 3-4 x weekly - 2 sets - 10 reps - 5 sec hold - Scapular Retraction with Resistance Advanced  - 1  x daily - 3-4 x weekly - 2 sets - 10 reps - 5 sec hold - Marching with Resistance  - 1 x daily - 3 x weekly - 2 sets - 10 reps - 3 sec hold - Standing Hip Extension with Resistance at Ankles and Counter Support  - 1 x daily - 3 x weekly - 2 sets - 10 reps - 3 sec hold - Semi-Tandem Corner Balance With Eyes Open  - 1 x daily - 3 x weekly - 2 sets - 10 reps - 3 sec hold - Semi-Tandem Corner Balance: Eyes Open With Head Turns  - 1 x daily - 3 x weekly - 2 sets - 5 reps  PWR! Moves: - Sitting - Standing - Supine   ASSESSMENT:  CLINICAL IMPRESSION: Stephanie [name] has demonstrated good progress with PT with continued gains noted on FGA today - current score of 24/30 places her at the upper end of the medium fall risk (1 point away from low fall risk).  She continues to be able to participate in activities that she has not been able to over the past year, reporting she attended church again over the weekend.  She plans to look into the exercise  class/program where she hopes she can use her BCBS Silver Sneakers benefit tomorrow as she will be in that vicinity for other reasons.  When questioned regarding concerns or issues with everyday activities at home, she mentions difficulty with using her L UE with some tasks due to progressive weakness associated with her limited grip due to torn tendons but also feels like her proximal UE has gotten weaker over time.  Given that she has difficulty gripping things in her L hand suggested that she use a cuff style weight to allow for strengthening with exercises demonstrated accordingly today, as well as discussion of use of cuff weight during performance of PWR! Moves at home.  1 visit remaining in her current POC at which time we will complete final goal assessment and review her HEP, and anticipate transition to HEP with discharge from PT.  OBJECTIVE IMPAIRMENTS: Abnormal gait, cardiopulmonary status limiting activity, decreased activity tolerance, decreased balance, decreased coordination, decreased endurance, decreased knowledge of condition, decreased mobility, difficulty walking, decreased ROM, decreased strength, hypomobility, increased fascial restrictions, impaired perceived functional ability, increased muscle spasms, impaired flexibility, improper body mechanics, postural dysfunction, and pain.   ACTIVITY LIMITATIONS: carrying, lifting, bending, sitting, standing, squatting, sleeping, stairs, reach over head, and locomotion level  PARTICIPATION LIMITATIONS: meal prep, cleaning, laundry, driving, shopping, community activity, and church  PERSONAL FACTORS: Age, Past/current experiences, Time since onset of injury/illness/exacerbation, and 3+ comorbidities: Chronic neck pain, L shoulder advanced OA s/p reverse TSA on 10/14/22; R TKA 07/2019, B THR 2018 & 2017, posterior C1-2 cervical fusion 2018, rupture of flexor tendon of L hand with failed surgical repair 2020, Parkinson's, psoriatic arthritis,  bladder cancer, HTN  are also affecting patient's functional outcome.   REHAB POTENTIAL: Good  CLINICAL DECISION MAKING: Evolving/moderate complexity  EVALUATION COMPLEXITY: Moderate   GOALS: Goals reviewed with patient? Yes  SHORT TERM GOALS: Target date: 11/15/2023  Complete assessment of Berg and FGA and update goals and POC accordingly. Baseline:  Goal status: MET - 11/03/23  2.  Patient will be independent with initial HEP. Baseline:  Goal status: MET - 11/21/23   LONG TERM GOALS: Target date: 12/13/2023, extended to 01/10/2024   Patient will be independent with ongoing/advanced HEP for self-management at home incorporating PWR! Moves as indicated.  Baseline:  Goal status: IN PROGRESS - 12/13/23 - Patient reports no concerns or issues with flexibility and strengthening exercises or PWR! Moves but still feels unsure of balance related activities  2.  Patient will be able to ambulate 600' w/o AD with good safety to access community.  Baseline:  Goal status: MET - 12/13/23   3.  Patient will be able to step up/down curb safely with LRAD for safety with community ambulation.  Baseline:  Goal status: MET - 12/13/23   4.  Patient will report 25-50% reduction in L-sided neck/thoracic, scapular and shoulder pain to improve activity tolerance for mobility and ambulation.  Baseline: Goal status: MET - 12/05/23 - Pt reports 50% improvement with majority of benefit from Botox injections  5.  Patient will demonstrate gait speed of >/= 3.5 ft/sec to be a safe community ambulator with decreased risk for recurrent falls.  Baseline: 2.97 ft/sec Goal status: IN PROGRESS - 12/05/23 - 3.45 ft/sec; 01/02/24 - 3.34 ft/sec  6.  Patient will demonstrate at least 24/30 on FGA to improve gait stability and reduce risk for falls. (MCID = 4 points) Baseline: 15/30 (11/03/23) Goal status: MET - 12/1723 - 24/30  7.  Patient will improve Berg score to >/= 52/56 to improve safety and stability with ADLs in  standing and reduce risk for falls. (MCID = 8 points)   Baseline: 46/56 (11/03/23) Goal status: MET - 12/05/23 - 52/56   8.  Patient will report >/= 80% on ABC scale to demonstrate improved balance confidence and decreased risk for falls. Baseline: 64.4% Goal status: MET - 12/13/23 - 1290 / 1600 = 80.6 %  9. Patient will verbalize understanding of local Parkinson's disease community resources, including community fitness post d/c. Baseline:  Goal status: IN PROGRESS - 12/26/23 - patient reports she plans on looking into an exercise class that will be covered by her BCBS insurance   PLAN:  PT FREQUENCY: 1x/week  PT DURATION: 4 weeks  PLANNED INTERVENTIONS: 97164- PT Re-evaluation, 97110-Therapeutic exercises, 97530- Therapeutic activity, 97112- Neuromuscular re-education, 97535- Self Care, 13244- Manual therapy, [id number]- Gait training, 97014- Electrical stimulation (unattended), [id number]- Electrical stimulation (manual), [id number]- Ultrasound, [id number]- Ionotophoresis 4mg /ml Dexamethasone, Patient/Family education, Balance training, Stair training, Taping, Dry Needling, Joint mobilization, Spinal mobilization, Cryotherapy, and Moist heat  PLAN FOR NEXT SESSION:  Final goal assessment; HEP/PWR! Moves review as needed; anticipate transition to HEP with discharge from PT   Marry Guan, PT 01/02/2024, 6:12 PM

## 2024-01-03 ENCOUNTER — Telehealth (INDEPENDENT_AMBULATORY_CARE_PROVIDER_SITE_OTHER): Payer: Medicare Other | Admitting: Family Medicine

## 2024-01-03 ENCOUNTER — Encounter: Payer: Self-pay | Admitting: Family Medicine

## 2024-01-03 VITALS — BP 138/73 | HR 97

## 2024-01-03 DIAGNOSIS — I10 Essential (primary) hypertension: Secondary | ICD-10-CM

## 2024-01-03 DIAGNOSIS — E559 Vitamin D deficiency, unspecified: Secondary | ICD-10-CM

## 2024-01-03 DIAGNOSIS — G20A1 Parkinson's disease without dyskinesia, without mention of fluctuations: Secondary | ICD-10-CM | POA: Diagnosis not present

## 2024-01-03 DIAGNOSIS — E871 Hypo-osmolality and hyponatremia: Secondary | ICD-10-CM | POA: Diagnosis not present

## 2024-01-03 DIAGNOSIS — E7800 Pure hypercholesterolemia, unspecified: Secondary | ICD-10-CM | POA: Diagnosis not present

## 2024-01-03 NOTE — Progress Notes (Unsigned)
MyChart Video Visit    Virtual Visit via Video Note   This patient is at least at moderate risk for complications without adequate follow up. This format is felt to be most appropriate for this patient at this time. Physical exam was limited by quality of the video and audio technology used for the visit. Juanetta, CMA was able to get the patient set up on a video visit.  Patient location: Home Patient and provider in visit Provider location: Office  I discussed the limitations of evaluation and management by telemedicine and the availability of in person appointments. The patient expressed understanding and agreed to proceed.  Visit Date: 01/03/2024  Today's healthcare provider: Danise Edge, MD     Subjective:    Patient ID: Stephanie [name], female    DOB: 09/09/1932, 89 y.o.   MRN: [id number]  Chief Complaint  Patient presents with   Follow-up    HPI Discussed the use of AI scribe software for clinical note transcription with the patient, who gave verbal consent to proceed.  History of Present Illness   The patient, with a history of Parkinson's disease and psoriatic arthritis, reports significant improvement in her pain after receiving Botox injections in early January. The patient notes that the pain relief took about five to six days to start working but has since significantly reduced her severe neck pain.  In addition to her Parkinson's disease, the patient is also managing psoriatic arthritis, which is significantly affecting her joints. Despite trying various medications, including meloxicam, the patient reports that her joints are still hurting. She is currently managing the pain with meloxicam as needed, but cannot take it daily due to digestive problems.  The patient is also on Rytary for her Parkinson's disease. Initially, the medication caused constipation, but the patient has found that eating four to five prunes every morning helps manage this side effect. She  also mentions that the medication is expensive.  The patient is also receiving Simponi infusions for her arthritis. She recently had an infusion and has another scheduled in the future.        Past Medical History:  Diagnosis Date   Allergy    Anemia    past hx    Arthropathy of cervical spine 11/27/2012   Right  [facility]ray at [facility]  On 04/08/2016  AP, lateral, and lateral flexion and extension views of the cervical spine are submitted for evaluation.   No acute fracture identified in the cervical spine.   There is redemonstration of approximately 2 mm of C3 on C4 anterolisthesis in neutral which slightly increases in flexion relative to neutral and extension. Slight C5 on C6 retrolisthesis does not change i   Benign hypertension without congestive heart failure    Benign mole    right hcets mole, bleeds when dried with towel   Bilateral dry eyes    Bladder cancer (HCC) 13-Nov-2016   CAD (coronary artery disease), native coronary artery 04/2023   Coronary CTA showed a coronary calcium score to 46 with less than 25% left main, 25 to 49% proximal LAD, less than 25% left circumflex stenosis   Cataract    Cervical spondylosis    Cervicalgia    2 screws in place    Chronic kidney disease    Depression with anxiety 2016-11-13   prior to husbands death    Dyspnea    Gross hematuria    developed after first hip replacment, led to indicental finding of bladder tumor, bleeding now  resolved    H/O total shoulder replacement, left    History of kidney stones    1970's   Hyperlipidemia    Left hand weakness    due to reinjury of flxor tendon s/p tendon surgery march 12-2018   Lumbar stenosis    Malignant tumor of urinary bladder (HCC) 07/2016   Pre-stage I   OA (osteoarthritis)    Parkinson disease [facility]) dx 2012   neurologist-  dr siddiqui at [facility]   Psoriatic arthritis [facility])     Dr Nickola Major , [place] [facility]    Rupture of flexor tendon of hand 2020   right    Past Surgical History:   Procedure Laterality Date   BIOPSY MASS LEFT FIRST METACARPAL   06/23/2006   benign   CATARACT EXTRACTION W/ INTRAOCULAR LENS  IMPLANT, BILATERAL  1999 and 2003   CERVICAL FUSION  02/2017   c1-c2 ; reports she has 2 screws 2in long in place done at [facility] ; patient exhibits VERY LIMITED NECK ROM   COLONOSCOPY     COSMETIC SURGERY     CYSTOSCOPY W/ RETROGRADES Bilateral 09/22/2016   Procedure: CYSTOSCOPY WITH RETROGRADE PYELOGRAM;  Surgeon: Sebastian Ache, MD;  Location: [facility];  Service: Urology;  Laterality: Bilateral;   D & C HYSTEROSCOPY W/ RESECTION POLYP  07/11/2000   DILATION AND CURETTAGE OF UTERUS  1960's   EXCISION CYST AND DEBRIDEMENT RIGHT WIRST AND REMOVAL FORGEIGN BODY  01/09/2009   EYE SURGERY     FLEXOR TENDON REPAIR Left 12/12/2018   Procedure: REPAIR/TRANSFER FLEXOR DIGITORUM PROFUNDUS OF LEFT SMALL FINGER;  Surgeon: Cindee Salt, MD;  Location: [facility];  Service: Orthopedics;  Laterality: Left;   JOINT REPLACEMENT     LAPAROSCOPY  yrs ago   infertility    METACARPOPHALANGEAL JOINT ARTHRODESIS Right 05/25/1996   REVERSE SHOULDER ARTHROPLASTY Left 10/14/2022   Procedure: REVERSE SHOULDER ARTHROPLASTY;  Surgeon: Francena Hanly, MD;  Location: [facility];  Service: Orthopedics;  Laterality: Left;    SPINE SURGERY     TENDON REPAIR Left 01/15/2019   Hand   TONSILLECTOMY AND ADENOIDECTOMY  child   TOTAL HIP ARTHROPLASTY Left 07/07/2016   Procedure: LEFT TOTAL HIP ARTHROPLASTY ANTERIOR APPROACH;  Surgeon: Ollen Gross, MD;  Location: [facility];  Service: Orthopedics;  Laterality: Left;   TOTAL HIP ARTHROPLASTY Right 09/28/2017   Procedure: RIGHT TOTAL HIP ARTHROPLASTY ANTERIOR APPROACH;  Surgeon: Ollen Gross, MD;  Location: [facility];  Service: Orthopedics;  Laterality: Right;   TOTAL KNEE ARTHROPLASTY Right 07/30/2019   Procedure: TOTAL KNEE ARTHROPLASTY;  Surgeon: Ollen Gross, MD;  Location: [facility];  Service: Orthopedics;   Laterality: Right;    TRANSURETHRAL RESECTION OF BLADDER TUMOR N/A 09/22/2016   Procedure: TRANSURETHRAL RESECTION OF BLADDER TUMOR (TURBT);  Surgeon: Sebastian Ache, MD;  Location: [facility];  Service: Urology;  Laterality: N/A;    Family History  Problem Relation Age of Onset   Arthritis Mother    Hypertension Mother    Deep vein thrombosis Mother        recurrent, secondary to Bleeding disorder   Arthritis Father    Stroke Father    Bladder Cancer Father    Diabetes Sister    Heart disease Sister    Obesity Sister    Arthritis Sister    Hypertension Sister    Heart disease Maternal Grandmother    Heart disease Maternal Grandfather    Peripheral vascular disease Paternal Grandmother  s/p leg amputation   Stroke Paternal Grandfather    Esophageal cancer Paternal Aunt    Hearing loss Sister    Colon cancer Neg Hx    Colon polyps Neg Hx    Rectal cancer Neg Hx    Stomach cancer Neg Hx     Social History   Socioeconomic History   Marital status: Widowed    Spouse name: Not on file   Number of children: Not on file   Years of education: Not on file   Highest education level: Bachelor's degree (e.g., BA, AB, BS)  Occupational History   Not on file  Tobacco Use   Smoking status: Former    Current packs/day: 0.00    Types: Cigarettes    Start date: 05/04/1954    Quit date: 05/04/1964    Years since quitting: 59.7   Smokeless tobacco: Never   Tobacco comments:    Pt states that when she was smoking 1 pack of cigs would last her 3 days. 10/13/22 ALS   Vaping Use   Vaping status: Never Used  Substance and Sexual Activity   Alcohol use: Yes    Alcohol/week: 7.0 standard drinks of alcohol    Types: 7 Glasses of wine per week    Comment: ocassionally- social    Drug use: No   Sexual activity: Not Currently    Partners: Male  Other Topics Concern   Not on file  Social History Narrative   Not on file   Social Drivers of Health    Financial Resource Strain: Low Risk  (11/11/2023)   Overall Financial Resource Strain (CARDIA)    Difficulty of Paying Living Expenses: Not hard at all  Food Insecurity: No Food Insecurity (11/11/2023)   Hunger Vital Sign    Worried About Running Out of Food in the Last Year: Never true    Ran Out of Food in the Last Year: Never true  Transportation Needs: No Transportation Needs (11/11/2023)   PRAPARE - Administrator, Civil Service (Medical): No    Lack of Transportation (Non-Medical): No  Physical Activity: Inactive (11/11/2023)   Exercise Vital Sign    Days of Exercise per Week: 0 days    Minutes of Exercise per Session: 0 min  Stress: No Stress Concern Present (11/11/2023)   [employer] of Occupational Health - Occupational Stress Questionnaire    Feeling of Stress : Only a little  Social Connections: Moderately Integrated (11/11/2023)   Social Connection and Isolation Panel [NHANES]    Frequency of Communication with Friends and Family: More than three times a week    Frequency of Social Gatherings with Friends and Family: Once a week    Attends Religious Services: 1 to 4 times per year    Active Member of [employer] or Organizations: No    Attends Banker Meetings: 1 to 4 times per year    Marital Status: Widowed  Intimate Partner Violence: Patient Declined (10/14/2022)   Humiliation, Afraid, Rape, and Kick questionnaire    Fear of Current or Ex-Partner: Patient declined    Emotionally Abused: Patient declined    Physically Abused: Patient declined    Sexually Abused: Patient declined    Outpatient Medications Prior to Visit  Medication Sig Dispense Refill   amLODipine (NORVASC) 5 MG tablet TAKE 1 TABLET (5 MG TOTAL) BY MOUTH DAILY. 90 tablet 3   Apoaequorin (PREVAGEN PO) Take 1 tablet by mouth in the morning.     aspirin EC 81  MG tablet Take 81 mg by mouth daily. Swallow whole.     Carbidopa-Levodopa ER (RYTARY) 48.75-195 MG CPCR Take 2  capsules by mouth 4 (four) times daily.     cholecalciferol (VITAMIN D3) 25 MCG (1000 UNIT) tablet Take 1,000 Units by mouth in the morning.     cholestyramine (QUESTRAN) 4 g packet Take 1 packet (4 g total) by mouth 2 (two) times daily as needed. 30 each 2   Coenzyme Q10 (COQ10 PO) Take 1 tablet by mouth in the morning.     diazepam (VALIUM) 5 MG tablet TAKE 1 TABLET (5 MG TOTAL) BY MOUTH EVERY 12 (TWELVE) HOURS AS NEEDED. FOR ANXIETY 60 tablet 1   DIGESTIVE ENZYMES PO Take 1 capsule by mouth 3 (three) times daily before meals. Active Enzymes by Leotis Shames     EPINEPHrine 0.3 mg/0.3 mL IJ SOAJ injection Inject 0.3 mg into the muscle as needed for anaphylaxis.     fluticasone (CUTIVATE) 0.05 % cream Apply 1 application topically daily as needed (scalp psoriasis).  1   golimumab (SIMPONI ARIA) 50 MG/4ML SOLN injection Inject 50 mg into the vein See admin instructions. Given every 2 months - Last given 06/28/2019     hydrochlorothiazide (MICROZIDE) 12.5 MG capsule TAKE 1 CAPSULE BY MOUTH EVERY DAY 90 capsule 1   melatonin 5 MG TABS Take 5 mg by mouth at bedtime.     meloxicam (MOBIC) 15 MG tablet Take 15 mg by mouth daily as needed (joint pain (max 3-4 times/weekly)).     Multiple Vitamin (MULTIVITAMIN WITH MINERALS) TABS tablet Take 1 tablet by mouth in the morning. Centrum Silver     Polyethyl Glycol-Propyl Glycol (SYSTANE) 0.4-0.3 % SOLN Place 1-2 drops into both eyes 3 (three) times daily as needed (dry/irritated eyes.).     Probiotic Product (PROBIOTIC PO) Take 1 capsule by mouth in the morning and at bedtime. Restore Ultimate Probiotic     sertraline (ZOLOFT) 25 MG tablet TAKE 1 TABLET (25 MG TOTAL) BY MOUTH DAILY. 90 tablet 0   ezetimibe (ZETIA) 10 MG tablet Take 1 tablet (10 mg total) by mouth daily. 90 tablet 3   tiZANidine (ZANAFLEX) 4 MG tablet Take 2-4 mg by mouth every 8 (eight) hours as needed for muscle spasms. 30 tablet 0   No facility-administered medications prior to visit.     Allergies  Allergen Reactions   Crestor [Rosuvastatin] Other (See Comments)    Myalgias    Cleocin [Clindamycin] Diarrhea   Codeine Nausea And Vomiting   Hornet Venom    Penicillins Itching and Swelling    Has patient had a PCN reaction causing immediate rash, facial/tongue/throat swelling, SOB or lightheadedness with hypotension: Yes Has patient had a PCN reaction causing severe rash involving mucus membranes or skin necrosis: No Has patient had a PCN reaction that required hospitalization: No Has patient had a PCN reaction occurring within the last 10 years: No If all of the above answers are "NO", then may proceed with Cephalosporin use.    Vibramycin [Doxycycline] Diarrhea   Yellow Jacket Venom Swelling   Lodine [Etodolac] Rash   Tramadol Itching and Rash    Review of Systems  Constitutional:  Negative for fever and malaise/fatigue.  HENT:  Negative for congestion.   Eyes:  Negative for blurred vision.  Respiratory:  Negative for shortness of breath.   Cardiovascular:  Negative for chest pain, palpitations and leg swelling.  Gastrointestinal:  Negative for abdominal pain, blood in stool and nausea.  Genitourinary:  Negative for dysuria and frequency.  Musculoskeletal:  Negative for falls.  Skin:  Negative for rash.  Neurological:  Negative for dizziness, loss of consciousness and headaches.  Endo/Heme/Allergies:  Negative for environmental allergies.  Psychiatric/Behavioral:  Negative for depression. The patient is not nervous/anxious.        Objective:    Physical Exam Constitutional:      General: She is not in acute distress.    Appearance: Normal appearance. She is not ill-appearing or toxic-appearing.  HENT:     Head: Normocephalic and atraumatic.     Right Ear: External ear normal.     Left Ear: External ear normal.     Nose: Nose normal.  Eyes:     General:        Right eye: No discharge.        Left eye: No discharge.  Pulmonary:     Effort:  Pulmonary effort is normal.  Skin:    Findings: No rash.  Neurological:     Mental Status: She is alert and oriented to person, place, and time.  Psychiatric:        Behavior: Behavior normal.     BP 138/73   Pulse 97   LMP 01/13/2013 Comment: spotting-had benign endometrial biopsy  Wt Readings from Last 3 Encounters:  11/15/23 112 lb 12.8 oz (51.2 kg)  05/30/23 108 lb 12.8 oz (49.4 kg)  03/30/23 111 lb 12.8 oz (50.7 kg)       Assessment & Plan:  Primary hypertension Assessment & Plan: Well controlled, no changes to meds. Encouraged heart healthy diet such as the DASH diet and exercise as tolerated.     Pure hypercholesterolemia Assessment & Plan: Tolerating statin, encouraged heart healthy diet, avoid trans fats, minimize simple carbs and saturated fats. Increase exercise as tolerated   Parkinson's disease, unspecified whether dyskinesia present, unspecified whether manifestations fluctuate (HCC) Assessment & Plan: Following with neurology   Vitamin D deficiency Assessment & Plan: Supplement and monitor    Hyponatremia Assessment & Plan: Mild and asymptomatic. Continue water restriction and can be generous with salt intake      Assessment and Plan    Neck Pain Significant improvement following Botox injection by Dr. Rubin Payor in early January. Patient reports ability to participate in activities such as church and dining out, which were previously limited due to pain. Next Botox injection scheduled for April. -Continue current treatment plan and monitor for longevity of pain relief.  Parkinson's Disease Patient is currently managed by Dr. Rubin Payor and a PA, with appointments alternating every six months. Patient recently started Rytary, which initially caused constipation, but this has been managed with dietary changes. -Continue current treatment plan and monitor for any changes or side effects.  Psoriatic Arthritis Patient reports significant joint pain.  Recent discussion with Dr. Lendon Colonel did not result in changes to current medication due to lack of better alternatives. Patient is currently managing pain with intermittent use of Meloxicam, but cannot use daily due to digestive issues. -Consider reducing Meloxicam dose to 7.5mg  for more frequent use if tolerated. -Continue current treatment plan and monitor for changes in pain levels.  Hyponatremia Patient has been advised to reduce water intake and increase salt intake. Patient has switched to salted nuts and is monitoring sodium levels. -Continue current dietary changes and monitor sodium levels.  General Health Maintenance Patient is advised to continue current bowel management strategies, including dietary fiber, hydration, and occasional use of suppositories. Patient is also advised to take precautions against  current high levels of flu and other illnesses in the community. -Continue current bowel management strategies. -Take precautions when going out in public due to high levels of flu and other illnesses.         I discussed the assessment and treatment plan with the patient. The patient was provided an opportunity to ask questions and all were answered. The patient agreed with the plan and demonstrated an understanding of the instructions.   The patient was advised to call back or seek an in-person evaluation if the symptoms worsen or if the condition fails to improve as anticipated.  Danise Edge, MD [facility] at [facility] [phone] (phone) [phone] (fax)  [facility]

## 2024-01-04 ENCOUNTER — Encounter: Payer: Self-pay | Admitting: Family Medicine

## 2024-01-09 ENCOUNTER — Ambulatory Visit: Payer: Medicare Other | Admitting: Physical Therapy

## 2024-01-09 ENCOUNTER — Encounter: Payer: Self-pay | Admitting: Physical Therapy

## 2024-01-09 DIAGNOSIS — R293 Abnormal posture: Secondary | ICD-10-CM | POA: Diagnosis not present

## 2024-01-09 DIAGNOSIS — R2681 Unsteadiness on feet: Secondary | ICD-10-CM | POA: Diagnosis not present

## 2024-01-09 DIAGNOSIS — R2689 Other abnormalities of gait and mobility: Secondary | ICD-10-CM | POA: Diagnosis not present

## 2024-01-09 DIAGNOSIS — M542 Cervicalgia: Secondary | ICD-10-CM

## 2024-01-09 DIAGNOSIS — M546 Pain in thoracic spine: Secondary | ICD-10-CM | POA: Diagnosis not present

## 2024-01-09 NOTE — Therapy (Signed)
 OUTPATIENT PHYSICAL THERAPY TREATMENT / DISCHARGE SUMMARY    Patient Name: Stephanie [name] MRN: [id number] DOB:07/21/1932, 89 y.o., female Today's Date: 01/09/2024   END OF SESSION:  PT End of Session - 01/09/24 1402     Visit Number 13    Date for PT Re-Evaluation 01/10/24    Authorization Type Medicare & BCBS    Progress Note Due on Visit --   Recert/PN on visit #9 - 12/13/23   PT Start Time 1402    PT Stop Time 1433    PT Time Calculation (min) 31 min    Activity Tolerance Patient tolerated treatment well    Behavior During Therapy [facility] for tasks assessed/performed                    Past Medical History:  Diagnosis Date   Allergy    Anemia    past hx    Arthropathy of cervical spine 11/27/2012   Right  Xray at [facility]  On 04/08/2016  AP, lateral, and lateral flexion and extension views of the cervical spine are submitted for evaluation.   No acute fracture identified in the cervical spine.   There is redemonstration of approximately 2 mm of C3 on C4 anterolisthesis in neutral which slightly increases in flexion relative to neutral and extension. Slight C5 on C6 retrolisthesis does not change i   Benign hypertension without congestive heart failure    Benign mole    right hcets mole, bleeds when dried with towel   Bilateral dry eyes    Bladder cancer (HCC) 25-Oct-2016   CAD (coronary artery disease), native coronary artery 04/2023   Coronary CTA showed a coronary calcium score to 46 with less than 25% left main, 25 to 49% proximal LAD, less than 25% left circumflex stenosis   Cataract    Cervical spondylosis    Cervicalgia    2 screws in place    Chronic kidney disease    Depression with anxiety 2016/10/25   prior to husbands death    Dyspnea    Gross hematuria    developed after first hip replacment, led to indicental finding of bladder tumor, bleeding now resolved    H/O total shoulder replacement, left    History of kidney stones    1970's   Hyperlipidemia     Left hand weakness    due to reinjury of flxor tendon s/p tendon surgery march 12-2018   Lumbar stenosis    Malignant tumor of urinary bladder (HCC) 07/2016   Pre-stage I   OA (osteoarthritis)    Parkinson disease [facility]) dx 2012   neurologist-  dr siddiqui at [facility]   Psoriatic arthritis [facility])     Dr Nickola Major , [place] [facility]    Rupture of flexor tendon of hand 2020   right   Past Surgical History:  Procedure Laterality Date   BIOPSY MASS LEFT FIRST METACARPAL   06/23/2006   benign   CATARACT EXTRACTION W/ INTRAOCULAR LENS  IMPLANT, BILATERAL  1999 and 2003   CERVICAL FUSION  02/2017   c1-c2 ; reports she has 2 screws 2in long in place done at [facility] ; patient exhibits VERY LIMITED NECK ROM   COLONOSCOPY     COSMETIC SURGERY     CYSTOSCOPY W/ RETROGRADES Bilateral 09/22/2016   Procedure: CYSTOSCOPY WITH RETROGRADE PYELOGRAM;  Surgeon: Sebastian Ache, MD;  Location: [facility];  Service: Urology;  Laterality: Bilateral;   D & C HYSTEROSCOPY W/ RESECTION POLYP  07/11/2000   DILATION AND CURETTAGE OF UTERUS  1960's   EXCISION CYST AND DEBRIDEMENT RIGHT WIRST AND REMOVAL FORGEIGN BODY  01/09/2009   EYE SURGERY     FLEXOR TENDON REPAIR Left 12/12/2018   Procedure: REPAIR/TRANSFER FLEXOR DIGITORUM PROFUNDUS OF LEFT SMALL FINGER;  Surgeon: Cindee Salt, MD;  Location: [facility];  Service: Orthopedics;  Laterality: Left;   JOINT REPLACEMENT     LAPAROSCOPY  yrs ago   infertility    METACARPOPHALANGEAL JOINT ARTHRODESIS Right 05/25/1996   REVERSE SHOULDER ARTHROPLASTY Left 10/14/2022   Procedure: REVERSE SHOULDER ARTHROPLASTY;  Surgeon: Francena Hanly, MD;  Location: [facility];  Service: Orthopedics;  Laterality: Left;    SPINE SURGERY     TENDON REPAIR Left 01/15/2019   Hand   TONSILLECTOMY AND ADENOIDECTOMY  child   TOTAL HIP ARTHROPLASTY Left 07/07/2016   Procedure: LEFT TOTAL HIP ARTHROPLASTY ANTERIOR APPROACH;  Surgeon: Ollen Gross,  MD;  Location: [facility];  Service: Orthopedics;  Laterality: Left;   TOTAL HIP ARTHROPLASTY Right 09/28/2017   Procedure: RIGHT TOTAL HIP ARTHROPLASTY ANTERIOR APPROACH;  Surgeon: Ollen Gross, MD;  Location: [facility];  Service: Orthopedics;  Laterality: Right;   TOTAL KNEE ARTHROPLASTY Right 07/30/2019   Procedure: TOTAL KNEE ARTHROPLASTY;  Surgeon: Ollen Gross, MD;  Location: [facility];  Service: Orthopedics;  Laterality: Right;    TRANSURETHRAL RESECTION OF BLADDER TUMOR N/A 09/22/2016   Procedure: TRANSURETHRAL RESECTION OF BLADDER TUMOR (TURBT);  Surgeon: Sebastian Ache, MD;  Location: [facility];  Service: Urology;  Laterality: N/A;   Patient Active Problem List   Diagnosis Date Noted   CAD (coronary artery disease), native coronary artery 04/20/2023   S/P reverse total shoulder arthroplasty, left 10/14/2022   Preoperative cardiovascular examination 10/04/2022   SOB (shortness of breath) 08/02/2022   Insomnia 08/02/2022   Hyponatremia 08/01/2022   Shoulder pain, left 09/14/2021   History of COVID-19 01/06/2021   Diarrhea 09/03/2020   Vitamin D deficiency 09/03/2020   OA (osteoarthritis) of knee 07/30/2019   Tinea corporis 06/08/2019   Psoriatic arthritis (HCC) 04/02/2019   Anemia 03/13/2018   Hot flashes 03/13/2018   S/P cervical spinal fusion 03/31/2017   Neck pain 12/12/2016   Depression with anxiety 10/17/2016   Bladder cancer (HCC) 10/17/2016   OA (osteoarthritis) of hip 07/07/2016   Spinal stenosis of lumbar region with radiculopathy 04/08/2016   Spondylolisthesis of lumbar region 04/08/2016   Spondylosis of cervical region without myelopathy or radiculopathy 04/08/2016   Preventative health care 01/18/2016   Low back pain 01/18/2016   History of chicken pox    Hyperlipidemia 12/15/2014   Allergic rhinitis 06/25/2014   Allergy to bee sting 06/10/2014   TMJ tenderness 11/16/2013   Other malaise and fatigue 05/07/2013   Arthropathy of cervical  spine (HCC) 11/27/2012   Muscle spasms of neck 11/27/2012   Parkinson's disease (HCC) 05/24/2012   Epiretinal membrane 03/09/2012   Hyperopia with astigmatism and presbyopia 03/09/2012   Pseudophakia 03/09/2012   HTN (hypertension) 08/31/2011   Right knee pain 08/31/2011   Benign hypertensive heart disease without heart failure 05/12/2011    PCP: Bradd Canary, MD   REFERRING PROVIDER: Jaquita Folds, MD   REFERRING DIAG: G20.A1 Parkinson's disease w/o dyskinesia or fluctuating manifestations  THERAPY DIAG:  Other abnormalities of gait and mobility  Unsteadiness on feet  Abnormal posture  Cervicalgia  RATIONALE FOR EVALUATION AND TREATMENT: Rehabilitation  ONSET DATE: Diagnosed with PD in ~2012  NEXT MD VISIT: 11/23/2023 -  for Botox injections in neck and back   SUBJECTIVE:                                                                                                                                                                                                         SUBJECTIVE STATEMENT: Pt reports she was not able to check into the exercise class she is planning on joining as the facility was closed due to inclement weather the day she stopped.  She was able to attend church again this weekend.  EVAL: Pt reports her neurologist has changed her PD meds from Sinemet to Rytary but they are still working on the right dosage as the current dosage leads to increased dyskinesia.  She notes more stiffness from the PD.  She notes much of her difficulty also seems to stem from her chronic neck and back pain.  She got some relief from Botox injections at 5 different places in her neck in Oct 2024 but she feels like the effects are starting to fade.  She notes more difficulty with walking related to the pain in her neck and pain - feels like she cannot stand up straight.  Pain keeps her from going to church or walking for exercise.  PAIN: Are you having pain? Yes: NPRS scale:  0/10 currently; up to 7/10 with certain motions Pain location: L shoulder Pain description: tightness & ache Aggravating factors: sitting up straight, standing and walking Relieving factors: sitting in recliner, heat & vibration, ice, Meloxicam, Tylenol  PERTINENT HISTORY:  Chronic neck pain, L shoulder advanced OA s/p reverse TSA on 10/14/22; R TKA 07/2019, B THR 2018 & 2017, posterior C1-2 cervical fusion 2018, rupture of flexor tendon of L hand with failed surgical repair 2020, Parkinson's, psoriatic arthritis, bladder cancer, HTN    PRECAUTIONS: Fall  RED FLAGS: None  WEIGHT BEARING RESTRICTIONS: No  FALLS:  Has patient fallen in last 6 months? No  LIVING ENVIRONMENT: Lives with: lives alone Lives in: House/apartment Stairs: Yes: External: 4 steps; on right going up, on left going up, and can reach both Has following equipment at home: Single point cane, Walker - 2 wheeled, Tour manager, bed side commode, and Grab bars  OCCUPATION: Retired  [employer]: Independent and Leisure: used to Agricultural consultant at [employer], bible study, and walk 1/3 mile daily but recently unable    PATIENT GOALS: "Be able to walk (for exercise) again and go to church."   OBJECTIVE: (objective measures completed at initial evaluation unless otherwise dated)  DIAGNOSTIC FINDINGS:  N/A  COGNITION: Overall cognitive status:  Within functional limits for tasks assessed and feels like her memory is not as good as it once was   SENSATION: WFL  COORDINATION: Heel/toe WNL. Heel to shin limited more but muscle tightness/stiffness from prior joint replacements.  MUSCLE LENGTH:  TBA as indicated Hamstrings:  ITB:  Piriformis:  Hip flexors:  Quads:  Heelcord:   POSTURE:  rounded shoulders, forward head, weight shift left, and head rests in 25 flexion & 8 R lateral flexion  CERVICAL ROM:  Head rests in 25 flexion & 8 R lateral flexion (10/18/23)  Active ROM Eval  Flexion 58  Extension 10  Right lateral  flexion 20  Left lateral flexion 5  Right rotation 40  Left rotation 22   (Blank rows = not tested)  UPPER EXTREMITY ROM:  Active ROM Right eval Left eval  Shoulder flexion 140 119  Shoulder extension    Shoulder abduction 127 105  Shoulder adduction    Shoulder internal rotation    Shoulder external rotation    Elbow flexion    Elbow extension    Wrist flexion    Wrist extension    Wrist ulnar deviation    Wrist radial deviation    Wrist pronation    Wrist supination     (Blank rows = not tested)  UPPER EXTREMITY MMT:  MMT Right eval Left eval  Shoulder flexion 5 5  Shoulder extension 4 4+  Shoulder abduction 5 4+  Shoulder adduction    Shoulder internal rotation 4+ 4  Shoulder external rotation 4 4  Middle trapezius    Lower trapezius     (Blank rows = not tested)   LOWER EXTREMITY ROM:    Limited B hip IR/ER but otherwise [facility]  LOWER EXTREMITY MMT:  Tested in sitting on eval  MMT Right eval Left eval R 12/19/23 L 12/19/23  Hip flexion 4 4 5 5   Hip extension 4 4 5 5   Hip abduction 4+ 4+ 5 5  Hip adduction 4+ 4+ 5 5  Hip internal rotation 4+ 4+ 5 5  Hip external rotation 4 4 4+ 4+  Knee flexion 5 5 5 5   Knee extension 5 5 5 5   Ankle dorsiflexion 4+ 4+ 5 5  Ankle plantarflexion      Ankle inversion      Ankle eversion      (Blank rows = not tested)  BED MOBILITY:  NT  TRANSFERS: Assistive device utilized: None  Sit to stand: Complete Independence Stand to sit: Complete Independence Chair to chair: Complete Independence Floor:  NT  GAIT: Distance walked: Clinic distances Assistive device utilized: None Level of assistance: Complete Independence Gait pattern: step through pattern, decreased arm swing- Right, decreased arm swing- Left, lateral lean- Right, and decreased trunk rotation Comment: Gait speed = 2.97 ft/sec  RAMP: Level of Assistance:  NT Assistive device utilized: None Ramp Comments:   CURB:  Level of Assistance:  NT Assistive  device utilized: None Curb Comments:   STAIRS:  Level of Assistance: Modified independence  Stair Negotiation Technique: Alternating Pattern  with Single Rail on Right  Number of Stairs: 14    Height of Stairs: 7" Comments: Slower/more cautious on descent  FUNCTIONAL TESTS:  5 times sit to stand: 11.97 sec Timed up and go (TUG): Normal - 10.85 sec, Manual - 11.22 sec, Cognitive - 12.47 sec 10 meter walk test: 11.03 sec; Gait speed = 2.97 ft/sec  Berg Balance Scale: 46/56, 46-51 moderate fall risk (>50%)  (11/03/23) Functional gait  assessment: 15/30, < 19 = high risk fall (11/03/23)  12/05/23: 5xSTS:  11.00 sec TUG: Normal - 7.97 sec, Manual - 9.90 sec, Cognitive - 10.41 sec : 9.50 sec Gait speed = 3.45 ft/sec  Berg: 52/56, 52-55 lower fall risk (> 25%)  FGA: 22/30, 19-24 = medium risk fall   01/02/24:  10 MWT = 9.82 sec Gait speed = 3.34 ft/sec FGA = 24/30; scores of 19-24 indicate a medium fall risk  01/09/24: 5xSTS = 10.97 sec; >16 sec indicates a fall risk for patient's with PD, age matched norm for 21-89 y/o = 14.8 sec TUG: Normal = 7.60 sec, Manual = 8.56 sec, Cognitive = 9.28 sec; >/= 13.5 sec indicates a high fall risk) = 7.90 sec Gait speed = 4.15 ft/sec; community ambulator walking speed is >2.62 ft/sec and normal walking speed = 4.37 ft/sec FGA = 25/30; scores of 25-28 indicate a low risk fall   Baselines as of D/C from last PT episode for Parkinson's (09/2022): 5 times sit to stand: 12.31 sec Berg Balance Scale: 54/56 TUG, & FGA not assessed due to patient unable to return for final visit  Baselines as of D/C from PT (02/2022):  Timed up and go (TUG): 8.88 sec (Normal) 10 meter walk test: 8.82 sec; Gait speed: 3.72 ft/sec  Functional gait assessment: 26/30  PATIENT SURVEYS:  ABC scale 1030 / 1600 = 64.4 %   TODAY'S TREATMENT:   01/09/24 THERAPEUTIC EXERCISE: To improve strength and endurance.  Demonstration, verbal and tactile cues  throughout for technique.  Rec Bike - L5 x 6 min  PHYSICAL PERFORMANCE TEST or MEASUREMENT: 5xSTS = 10.97 sec; >16 sec indicates a fall risk for patient's with PD, age matched norm for 19-89 y/o = 14.8 sec TUG (>/= 13.5 sec indicates a high fall risk) Normal = 7.60 sec Manual = 8.56 sec Cognitive = 9.28 sec = 7.90 sec Gait speed = 4.15 ft/sec; community ambulator walking speed is >2.62 ft/sec and normal walking speed = 4.37 ft/sec FGA = 25/30; scores of 25-28 indicate a low risk fall Functional Gait  Assessment  Gait Level Surface Walks 20 ft in less than 5.5 sec, no assistive devices, good speed, no evidence for imbalance, normal gait pattern, deviates no more than 6 in outside of the 12 in walkway width.   Change in Gait Speed Able to smoothly change walking speed without loss of balance or gait deviation. Deviate no more than 6 in outside of the 12 in walkway width.   Gait with Horizontal Head Turns Performs head turns smoothly with slight change in gait velocity (eg, minor disruption to smooth gait path), deviates 6-10 in outside 12 in walkway width, or uses an assistive device.   Gait with Vertical Head Turns Performs head turns with no change in gait. Deviates no more than 6 in outside 12 in walkway width.   Gait and Pivot Turn Pivot turns safely within 3 sec and stops quickly with no loss of balance.   Step Over Obstacle Is able to step over 2 stacked shoe boxes taped together (9 in total height) without changing gait speed. No evidence of imbalance.   Gait with Narrow Base of Support Ambulates 7-9 steps.   Gait with Eyes Closed Walks 20 ft, uses assistive device, slower speed, mild gait deviations, deviates 6-10 in outside 12 in walkway width. Ambulates 20 ft in less than 9 sec but greater than 7 sec.   Ambulating Backwards Walks 20 ft, uses assistive device,  slower speed, mild gait deviations, deviates 6-10 in outside 12 in walkway width.   Steps Alternating feet, must use rail.    Total Score 25   FGA comment: 25-28 = low risk fall      SELF CARE: Provided education to prevent loss of gains achieved with physical therapy and to prevent future decline in function.  Verbal review of HEP and PWR! Moves including recommended frequency for ongoing HEP at discharge to prevent loss of gains achieved with PT, alternating through exercises and PWR! Moves over the course of a few days. Patient planning to join an exercise class/program at [facility] where she hopes she can use her BCBS Silver Sneakers benefit, but is aware of other community based exercise programs from the [employer].   PATIENT EDUCATION:  Education details: HEP review, continue with current HEP, recommended frequency for ongoing HEP at discharge to prevent loss of gains achieved with PT, and reviewed information in monthly [employer] Parkinson's emails and provided handout(s) on community focused exercise programs for pt's with PD or movement disorders to prevent loss of gains achieved with PT and slow progression of PD related deficits  Person educated: Patient Education method: Explanation Education comprehension: verbalized understanding  HOME EXERCISE PROGRAM: Access Code: [email or web address] URL: [email or web address] Date: 11/29/2023 Prepared by: Glenetta Hew  Exercises - Seated Hamstring Stretch  - 2 x daily - 7 x weekly - 3 reps - 30 sec hold - Seated Hip Abduction with Resistance  - 1 x daily - 3-4 x weekly - 2 sets - 10 reps - 3-5 sec hold - Seated March with Resistance  - 1 x daily - 3-4 x weekly - 2 sets - 10 reps - 3 sec hold - Sit to Stand with Resistance Around Legs  - 1 x daily - 3-4 x weekly - 2 sets - 10 reps - Seated Shoulder Setting  - 1 x daily - 7 x weekly - 2 sets - 10 reps - 3 sec hold - Standing Bilateral Low Shoulder Row with Anchored Resistance  - 1 x daily - 3-4 x weekly - 2 sets - 10 reps - 5 sec hold - Scapular Retraction with Resistance Advanced  - 1 x  daily - 3-4 x weekly - 2 sets - 10 reps - 5 sec hold - Marching with Resistance  - 1 x daily - 3 x weekly - 2 sets - 10 reps - 3 sec hold - Standing Hip Extension with Resistance at Ankles and Counter Support  - 1 x daily - 3 x weekly - 2 sets - 10 reps - 3 sec hold - Semi-Tandem Corner Balance With Eyes Open  - 1 x daily - 3 x weekly - 2 sets - 10 reps - 3 sec hold - Semi-Tandem Corner Balance: Eyes Open With Head Turns  - 1 x daily - 3 x weekly - 2 sets - 5 reps  PWR! Moves: - Sitting - Standing - Supine   ASSESSMENT:  CLINICAL IMPRESSION: Josiane has demonstrated good progress with PT and is functionally back to the levels she had achieved by the end of prior PT episodes with all standardized balance testing now either below the increased fall risk threshold (5xSTS, TUG and gait speed) or demonstrating only a low fall risk (FGA).  She has been more active in the community, attending church and outings with friends again which she has not been able to do in the past year.  She feels confident with her HEP  and denies need for further practice with any exercises or PWR! Moves but we did review recommended frequency for ongoing performance, alternating through exercises and PWR! Moves over the course of a few days.  She was not able to look into the exercise class/program at [facility] where she hopes she can use her BCBS Silver Sneakers benefit last week due to the facility being closed for inclement weather, but still plans to participate in this program to help remain active and prevent future decline.  All PT goals now met and Bona feels ready to transition to her HEP, therefore will proceed with discharge from physical therapy for this episode.  Will plan for her to return for a 57-month PD screen in late August, with the front office to call in late July to schedule the PD screen.  OBJECTIVE IMPAIRMENTS: Abnormal gait, cardiopulmonary status limiting activity, decreased activity tolerance,  decreased balance, decreased coordination, decreased endurance, decreased knowledge of condition, decreased mobility, difficulty walking, decreased ROM, decreased strength, hypomobility, increased fascial restrictions, impaired perceived functional ability, increased muscle spasms, impaired flexibility, improper body mechanics, postural dysfunction, and pain.   ACTIVITY LIMITATIONS: carrying, lifting, bending, sitting, standing, squatting, sleeping, stairs, reach over head, and locomotion level  PARTICIPATION LIMITATIONS: meal prep, cleaning, laundry, driving, shopping, community activity, and church  PERSONAL FACTORS: Age, Past/current experiences, Time since onset of injury/illness/exacerbation, and 3+ comorbidities: Chronic neck pain, L shoulder advanced OA s/p reverse TSA on 10/14/22; R TKA 07/2019, B THR 2018 & 2017, posterior C1-2 cervical fusion 2018, rupture of flexor tendon of L hand with failed surgical repair 2020, Parkinson's, psoriatic arthritis, bladder cancer, HTN  are also affecting patient's functional outcome.   REHAB POTENTIAL: Good  CLINICAL DECISION MAKING: Evolving/moderate complexity  EVALUATION COMPLEXITY: Moderate   GOALS: Goals reviewed with patient? Yes  SHORT TERM GOALS: Target date: 11/15/2023  Complete assessment of Berg and FGA and update goals and POC accordingly. Baseline:  Goal status: MET - 11/03/23  2.  Patient will be independent with initial HEP. Baseline:  Goal status: MET - 11/21/23   LONG TERM GOALS: Target date: 12/13/2023, extended to 01/10/2024   Patient will be independent with ongoing/advanced HEP for self-management at home incorporating PWR! Moves as indicated.  Baseline:  Goal status: MET - 01/09/24   2.  Patient will be able to ambulate 600' w/o AD with good safety to access community.  Baseline:  Goal status: MET - 12/13/23   3.  Patient will be able to step up/down curb safely with LRAD for safety with community ambulation.   Baseline:  Goal status: MET - 12/13/23   4.  Patient will report 25-50% reduction in L-sided neck/thoracic, scapular and shoulder pain to improve activity tolerance for mobility and ambulation.  Baseline: Goal status: MET - 12/05/23 - Pt reports 50% improvement with majority of benefit from Botox injections  5.  Patient will demonstrate gait speed of >/= 3.5 ft/sec to be a safe community ambulator with decreased risk for recurrent falls.  Baseline: 2.97 ft/sec; 12/05/23 - 3.45 ft/sec; 01/02/24 - 3.34 ft/sec Goal status: MET - 01/09/24 - 4.15 ft/sec  6.  Patient will demonstrate at least 24/30 on FGA to improve gait stability and reduce risk for falls. (MCID = 4 points) Baseline: 15/30 (11/03/23) Goal status: MET - 12/1723 - 24/30  7.  Patient will improve Berg score to >/= 52/56 to improve safety and stability with ADLs in standing and reduce risk for falls. (MCID = 8 points)   Baseline: 46/56 (  11/03/23) Goal status: MET - 12/05/23 - 52/56   8.  Patient will report >/= 80% on ABC scale to demonstrate improved balance confidence and decreased risk for falls. Baseline: 64.4% Goal status: MET - 12/13/23 - 1290 / 1600 = 80.6 %  9. Patient will verbalize understanding of local Parkinson's disease community resources, including community fitness post d/c. Baseline:  Goal status: MET - 01/09/24 - patient reports she plans on looking into an exercise class at [facility] that will be covered by her [employer] (attempted to go last week but the location was closed d/t weather)   PLAN:  PT FREQUENCY: 1x/week  PT DURATION: 4 weeks  PLANNED INTERVENTIONS: 97164- PT Re-evaluation, 97110-Therapeutic exercises, 97530- Therapeutic activity, 97112- Neuromuscular re-education, 97535- Self Care, 54098- Manual therapy, 97116- Gait training, 97014- Electrical stimulation (unattended), [id number]- Electrical stimulation (manual), 97035- Ultrasound, 11914- Ionotophoresis 4mg /ml Dexamethasone, Patient/Family  education, Balance training, Stair training, Taping, Dry Needling, Joint mobilization, Spinal mobilization, Cryotherapy, and Moist heat  PLAN FOR NEXT SESSION:  Transition to HEP with discharge from PT; plan for 17-month PD screen in late August (office to call in late July to schedule PD screen)    PHYSICAL THERAPY DISCHARGE SUMMARY  Visits from Start of Care: 13  Current functional level related to goals / functional outcomes: Refer to above clinical impression and goal assessment.    Remaining deficits: As above.  Limited with UE activities due chronic deficits from torn tendons in her L wrist but we discussed possible use of cuff weights in place of dumbbells or resistance bands for UE strengthening.   Education / Equipment: HEP, PWR! Moves, [employer] [facility]'s resources   Patient agrees to discharge. Patient goals were met. Patient is being discharged due to meeting the stated rehab goals.   Marry Guan, PT 01/09/2024, 5:23 PM

## 2024-02-06 DIAGNOSIS — H26492 Other secondary cataract, left eye: Secondary | ICD-10-CM | POA: Diagnosis not present

## 2024-02-06 DIAGNOSIS — H35371 Puckering of macula, right eye: Secondary | ICD-10-CM | POA: Diagnosis not present

## 2024-02-06 DIAGNOSIS — H16223 Keratoconjunctivitis sicca, not specified as Sjogren's, bilateral: Secondary | ICD-10-CM | POA: Diagnosis not present

## 2024-02-06 DIAGNOSIS — Z961 Presence of intraocular lens: Secondary | ICD-10-CM | POA: Diagnosis not present

## 2024-02-11 ENCOUNTER — Other Ambulatory Visit: Payer: Self-pay | Admitting: Family Medicine

## 2024-02-15 DIAGNOSIS — G243 Spasmodic torticollis: Secondary | ICD-10-CM | POA: Diagnosis not present

## 2024-02-15 DIAGNOSIS — G2589 Other specified extrapyramidal and movement disorders: Secondary | ICD-10-CM | POA: Diagnosis not present

## 2024-02-23 DIAGNOSIS — L4050 Arthropathic psoriasis, unspecified: Secondary | ICD-10-CM | POA: Diagnosis not present

## 2024-02-29 ENCOUNTER — Telehealth: Payer: Self-pay | Admitting: Family Medicine

## 2024-02-29 NOTE — Telephone Encounter (Signed)
 Copied from CRM [id number]. Topic: Medicare AWV >> Feb 29, 2024 10:06 AM Juliana Ocean wrote: Reason for CRM: Called LVM 02/29/2024 to schedule AWV. Please schedule Virtual or Telehealth visits ONLY.   Rosalee Collins; Care Guide Ambulatory Clinical Support [facility] Direct Dial: [phone]

## 2024-03-04 ENCOUNTER — Other Ambulatory Visit: Payer: Self-pay | Admitting: Family Medicine

## 2024-03-11 NOTE — Assessment & Plan Note (Signed)
 Continue to monitor

## 2024-03-11 NOTE — Assessment & Plan Note (Signed)
 Tolerating statin, encouraged heart healthy diet, avoid trans fats, minimize simple carbs and saturated fats. Increase exercise as tolerated

## 2024-03-11 NOTE — Assessment & Plan Note (Signed)
 asymptomatic

## 2024-03-11 NOTE — Assessment & Plan Note (Signed)
Following with neurology 

## 2024-03-11 NOTE — Assessment & Plan Note (Signed)
 Well controlled, no changes to meds. Encouraged heart healthy diet such as the DASH diet and exercise as tolerated.

## 2024-03-11 NOTE — Assessment & Plan Note (Signed)
 Has trouble maintaining sleep due to tremor but diazepam  is helpful

## 2024-03-11 NOTE — Assessment & Plan Note (Signed)
 Supplement and monitor

## 2024-03-12 ENCOUNTER — Ambulatory Visit (INDEPENDENT_AMBULATORY_CARE_PROVIDER_SITE_OTHER): Payer: Medicare Other | Admitting: Family Medicine

## 2024-03-12 ENCOUNTER — Encounter: Payer: Self-pay | Admitting: Family Medicine

## 2024-03-12 VITALS — BP 116/68 | HR 83 | Temp 97.7°F | Resp 16 | Ht 62.0 in | Wt 112.4 lb

## 2024-03-12 DIAGNOSIS — I10 Essential (primary) hypertension: Secondary | ICD-10-CM

## 2024-03-12 DIAGNOSIS — G20A1 Parkinson's disease without dyskinesia, without mention of fluctuations: Secondary | ICD-10-CM | POA: Diagnosis not present

## 2024-03-12 DIAGNOSIS — G4700 Insomnia, unspecified: Secondary | ICD-10-CM | POA: Diagnosis not present

## 2024-03-12 DIAGNOSIS — E7800 Pure hypercholesterolemia, unspecified: Secondary | ICD-10-CM

## 2024-03-12 DIAGNOSIS — E559 Vitamin D deficiency, unspecified: Secondary | ICD-10-CM

## 2024-03-12 DIAGNOSIS — I2510 Atherosclerotic heart disease of native coronary artery without angina pectoris: Secondary | ICD-10-CM

## 2024-03-12 DIAGNOSIS — F418 Other specified anxiety disorders: Secondary | ICD-10-CM | POA: Diagnosis not present

## 2024-03-12 MED ORDER — DIAZEPAM 5 MG PO TABS: 2.5000 mg | ORAL_TABLET | Freq: Two times a day (BID) | ORAL | 2 refills | Status: DC | PRN

## 2024-03-12 MED ORDER — SERTRALINE HCL 25 MG PO TABS: 12.5000 mg | ORAL_TABLET | Freq: Every day | ORAL | Status: DC

## 2024-03-12 MED ORDER — DIAZEPAM 5 MG PO TABS: ORAL_TABLET | ORAL | 1 refills | Status: DC

## 2024-03-12 NOTE — Progress Notes (Signed)
 Subjective:    Patient ID: Stephanie [name], female    DOB: [birth year], 89 y.o.   MRN: [id number]  Chief Complaint  Patient presents with  . Medical Management of Chronic Issues    Patient presents today for a 3 month follow-up  . Quality Metric Gaps    AWV,TDAP    HPI Discussed the use of AI scribe software for clinical note transcription with the patient, who gave verbal consent to proceed.  History of Present Illness Stephanie [name] is a 89 year old female with Parkinson's disease who presents with medication side effects and nocturia.  She experiences hot flashes and dry mouth since starting Rytary  for Parkinson's disease. The hot flashes occur both during the day and at night and are described as 'very uncomfortable'. She manages dry mouth with gum and copes with hot flashes by adjusting her clothing. She has a history of early menopause and previously experienced hot flashes during that time. She does not believe early menopause runs in her family.  She has chosen not to take sertraline  due to concerns about potential interactions with Rytary  and the impact on her ability to drive. She was prescribed a low dose of 25 mg but has not started it.  She received a Botox injection approximately three weeks ago, which has improved her neck and shoulder pain, allowing her to sleep without discomfort.  She reports frequent nocturia, waking up two to four times a night to urinate. She has adjusted her amlodipine  intake from bedtime to dinnertime, which she believes has slightly reduced the frequency of nighttime urination. She takes diazepam  at night and HCTZ in the morning. No blood in urine or symptoms suggestive of a urinary tract infection.    Past Medical History:  Diagnosis Date  . Allergy   . Anemia    past hx   . Arthropathy of cervical spine 11/27/2012   Right  Xray at [facility]  On 04/08/2016  AP, lateral, and lateral flexion and extension views of the cervical spine are submitted  for evaluation.   No acute fracture identified in the cervical spine.   There is redemonstration of approximately 2 mm of C3 on C4 anterolisthesis in neutral which slightly increases in flexion relative to neutral and extension. Slight C5 on C6 retrolisthesis does not change i  . Benign hypertension without congestive heart failure   . Benign mole    right hcets mole, bleeds when dried with towel  . Bilateral dry eyes   . Bladder cancer (HCC) 10-19-2016  . CAD (coronary artery disease), native coronary artery 04/2023   Coronary CTA showed a coronary calcium  score to 46 with less than 25% left main, 25 to 49% proximal LAD, less than 25% left circumflex stenosis  . Cataract   . Cervical spondylosis   . Cervicalgia    2 screws in place   . Chronic kidney disease   . Depression with anxiety 10-19-2016   prior to husbands death   . Dyspnea   . Gross hematuria    developed after first hip replacment, led to indicental finding of bladder tumor, bleeding now resolved   . H/O total shoulder replacement, left   . History of kidney stones    1970's  . Hyperlipidemia   . Left hand weakness    due to reinjury of flxor tendon s/p tendon surgery march 12-2018  . Lumbar stenosis   . Malignant tumor of urinary bladder (HCC) 07/2016   Pre-stage I  . OA (  osteoarthritis)   . Parkinson disease [facility]) dx 2012   neurologist-  dr siddiqui at [facility]  . Psoriatic arthritis [facility])     Dr Meredith Stalls , [place] [facility]   . Rupture of flexor tendon of hand 2020   right    Past Surgical History:  Procedure Laterality Date  . BIOPSY MASS LEFT FIRST METACARPAL   06/23/2006   benign  . CATARACT EXTRACTION W/ INTRAOCULAR LENS  IMPLANT, BILATERAL  1999 and 2003  . CERVICAL FUSION  02/2017   c1-c2 ; reports she has 2 screws 2in long in place done at [facility] ; patient exhibits VERY LIMITED NECK ROM  . COLONOSCOPY    . COSMETIC SURGERY    . CYSTOSCOPY W/ RETROGRADES Bilateral 09/22/2016   Procedure:  CYSTOSCOPY WITH RETROGRADE PYELOGRAM;  Surgeon: Osborn Blaze, MD;  Location: [facility];  Service: Urology;  Laterality: Bilateral;  . D & C HYSTEROSCOPY W/ RESECTION POLYP  07/11/2000  . DILATION AND CURETTAGE OF UTERUS  1960's  . EXCISION CYST AND DEBRIDEMENT RIGHT WIRST AND REMOVAL FORGEIGN BODY  01/09/2009  . EYE SURGERY    . FLEXOR TENDON REPAIR Left 12/12/2018   Procedure: REPAIR/TRANSFER FLEXOR DIGITORUM PROFUNDUS OF LEFT SMALL FINGER;  Surgeon: Lyanne Sample, MD;  Location:  SURGERY CENTER;  Service: Orthopedics;  Laterality: Left;  . JOINT REPLACEMENT    . LAPAROSCOPY  yrs ago   infertility   . METACARPOPHALANGEAL JOINT ARTHRODESIS Right 05/25/1996  . REVERSE SHOULDER ARTHROPLASTY Left 10/14/2022   Procedure: REVERSE SHOULDER ARTHROPLASTY;  Surgeon: Ellard Gunning, MD;  Location: [facility];  Service: Orthopedics;  Laterality: Left;   . SPINE SURGERY    . TENDON REPAIR Left 01/15/2019   Hand  . TONSILLECTOMY AND ADENOIDECTOMY  child  . TOTAL HIP ARTHROPLASTY Left 07/07/2016   Procedure: LEFT TOTAL HIP ARTHROPLASTY ANTERIOR APPROACH;  Surgeon: Liliane Rei, MD;  Location: [facility];  Service: Orthopedics;  Laterality: Left;  . TOTAL HIP ARTHROPLASTY Right 09/28/2017   Procedure: RIGHT TOTAL HIP ARTHROPLASTY ANTERIOR APPROACH;  Surgeon: Liliane Rei, MD;  Location: [facility];  Service: Orthopedics;  Laterality: Right;  . TOTAL KNEE ARTHROPLASTY Right 07/30/2019   Procedure: TOTAL KNEE ARTHROPLASTY;  Surgeon: Liliane Rei, MD;  Location: [facility];  Service: Orthopedics;  Laterality: Right;   . TRANSURETHRAL RESECTION OF BLADDER TUMOR N/A 09/22/2016   Procedure: TRANSURETHRAL RESECTION OF BLADDER TUMOR (TURBT);  Surgeon: Osborn Blaze, MD;  Location: [facility];  Service: Urology;  Laterality: N/A;    Family History  Problem Relation Age of Onset  . Arthritis Mother   . Hypertension Mother   . Deep vein thrombosis Mother         recurrent, secondary to Bleeding disorder  . Arthritis Father   . Stroke Father   . Bladder Cancer Father   . Diabetes Sister   . Heart disease Sister   . Obesity Sister   . Arthritis Sister   . Hypertension Sister   . Heart disease Maternal Grandmother   . Heart disease Maternal Grandfather   . Peripheral vascular disease Paternal Grandmother        s/p leg amputation  . Stroke Paternal Grandfather   . Esophageal cancer Paternal Aunt   . Hearing loss Sister   . Colon cancer Neg Hx   . Colon polyps Neg Hx   . Rectal cancer Neg Hx   . Stomach cancer Neg Hx     Social History   Socioeconomic  History  . Marital status: Widowed    Spouse name: Not on file  . Number of children: Not on file  . Years of education: Not on file  . Highest education level: Bachelor's degree (e.g., BA, AB, BS)  Occupational History  . Not on file  Tobacco Use  . Smoking status: Former    Current packs/day: 0.00    Types: Cigarettes    Start date: 05/04/1954    Quit date: 05/04/1964    Years since quitting: 59.8  . Smokeless tobacco: Never  . Tobacco comments:    Pt states that when she was smoking 1 pack of cigs would last her 3 days. 10/13/22 ALS   Vaping Use  . Vaping status: Never Used  Substance and Sexual Activity  . Alcohol use: Yes    Alcohol/week: 7.0 standard drinks of alcohol    Types: 7 Glasses of wine per week    Comment: ocassionally- social   . Drug use: No  . Sexual activity: Not Currently    Partners: Male  Other Topics Concern  . Not on file  Social History Narrative  . Not on file   Social Drivers of Health   Financial Resource Strain: Low Risk  (11/11/2023)   Overall Financial Resource Strain (CARDIA)   . Difficulty of Paying Living Expenses: Not hard at all  Food Insecurity: No Food Insecurity (11/11/2023)   Hunger Vital Sign   . Worried About Programme researcher, broadcasting/film/video in the Last Year: Never true   . Ran Out of Food in the Last Year: Never true  Transportation  Needs: No Transportation Needs (11/11/2023)   PRAPARE - Transportation   . Lack of Transportation (Medical): No   . Lack of Transportation (Non-Medical): No  Physical Activity: Inactive (11/11/2023)   Exercise Vital Sign   . Days of Exercise per Week: 0 days   . Minutes of Exercise per Session: 0 min  Stress: No Stress Concern Present (11/11/2023)   [employer] of Occupational Health - Occupational Stress Questionnaire   . Feeling of Stress : Only a little  Social Connections: Moderately Integrated (11/11/2023)   Social Connection and Isolation Panel [NHANES]   . Frequency of Communication with Friends and Family: More than three times a week   . Frequency of Social Gatherings with Friends and Family: Once a week   . Attends Religious Services: 1 to 4 times per year   . Active Member of Clubs or Organizations: No   . Attends Banker Meetings: 1 to 4 times per year   . Marital Status: Widowed  Intimate Partner Violence: Patient Declined (10/14/2022)   Humiliation, Afraid, Rape, and Kick questionnaire   . Fear of Current or Ex-Partner: Patient declined   . Emotionally Abused: Patient declined   . Physically Abused: Patient declined   . Sexually Abused: Patient declined    Outpatient Medications Prior to Visit  Medication Sig Dispense Refill  . amLODipine  (NORVASC ) 5 MG tablet TAKE 1 TABLET (5 MG TOTAL) BY MOUTH DAILY. 90 tablet 3  . Apoaequorin (PREVAGEN PO) Take 1 tablet by mouth in the morning.    Aaron Aas aspirin EC 81 MG tablet Take 81 mg by mouth daily. Swallow whole.    . Carbidopa -Levodopa  ER (RYTARY ) 48.75-195 MG CPCR Take 2 capsules by mouth 4 (four) times daily.    . cholecalciferol (VITAMIN D3) 25 MCG (1000 UNIT) tablet Take 1,000 Units by mouth in the morning.    . cholestyramine  (QUESTRAN ) 4 g packet  Take 1 packet (4 g total) by mouth 2 (two) times daily as needed. 30 each 2  . Coenzyme Q10 (COQ10 PO) Take 1 tablet by mouth in the morning.    Aaron Aas DIGESTIVE  ENZYMES PO Take 1 capsule by mouth 3 (three) times daily before meals. Active Enzymes by Larissa Plowman    . EPINEPHrine  0.3 mg/0.3 mL IJ SOAJ injection Inject 0.3 mg into the muscle as needed for anaphylaxis.    . fluticasone  (CUTIVATE ) 0.05 % cream Apply 1 application topically daily as needed (scalp psoriasis).  1  . golimumab  (SIMPONI  ARIA) 50 MG/4ML SOLN injection Inject 50 mg into the vein See admin instructions. Given every 2 months - Last given 06/28/2019    . hydrochlorothiazide  (MICROZIDE ) 12.5 MG capsule TAKE 1 CAPSULE BY MOUTH EVERY DAY 90 capsule 1  . melatonin 5 MG TABS Take 5 mg by mouth at bedtime.    . meloxicam  (MOBIC ) 15 MG tablet Take 15 mg by mouth daily as needed (joint pain (max 3-4 times/weekly)).    . Multiple Vitamin (MULTIVITAMIN WITH MINERALS) TABS tablet Take 1 tablet by mouth in the morning. Centrum Silver    . Polyethyl Glycol-Propyl Glycol (SYSTANE) 0.4-0.3 % SOLN Place 1-2 drops into both eyes 3 (three) times daily as needed (dry/irritated eyes.).    Aaron Aas Probiotic Product (PROBIOTIC PO) Take 1 capsule by mouth in the morning and at bedtime. Restore Ultimate Probiotic    . diazepam  (VALIUM ) 5 MG tablet TAKE 1 TABLET (5 MG TOTAL) BY MOUTH EVERY 12 (TWELVE) HOURS AS NEEDED. FOR ANXIETY (Patient taking differently: TAKE 1 TABLET (5 MG TOTAL) BY MOUTH EVERY 12 (TWELVE) HOURS AS NEEDED. FOR ANXIETY.  Pt reports 1/2 tablet (2.5 mg) at bedtime) 60 tablet 1  . ezetimibe  (ZETIA ) 10 MG tablet Take 1 tablet (10 mg total) by mouth daily. (Patient taking differently: Take 5 mg by mouth daily.) 90 tablet 3  . sertraline  (ZOLOFT ) 25 MG tablet TAKE 1 TABLET (25 MG TOTAL) BY MOUTH DAILY. (Patient not taking: Reported on 03/12/2024) 90 tablet 1   No facility-administered medications prior to visit.    Allergies  Allergen Reactions  . Crestor  [Rosuvastatin ] Other (See Comments)    Myalgias   . Cleocin [Clindamycin] Diarrhea  . Codeine  Nausea And Vomiting  . Hornet Venom   .  Penicillins Itching and Swelling    Has patient had a PCN reaction causing immediate rash, facial/tongue/throat swelling, SOB or lightheadedness with hypotension: Yes Has patient had a PCN reaction causing severe rash involving mucus membranes or skin necrosis: No Has patient had a PCN reaction that required hospitalization: No Has patient had a PCN reaction occurring within the last 10 years: No If all of the above answers are "NO", then may proceed with Cephalosporin use.   . Vibramycin  [Doxycycline ] Diarrhea  . Yellow Jacket Venom Swelling  . Lodine [Etodolac] Rash  . Tramadol Itching and Rash    Review of Systems  Constitutional:  Negative for fever and malaise/fatigue.  HENT:  Negative for congestion.   Eyes:  Negative for blurred vision.  Respiratory:  Negative for shortness of breath.   Cardiovascular:  Positive for palpitations. Negative for chest pain and leg swelling.  Gastrointestinal:  Negative for abdominal pain, blood in stool and nausea.  Genitourinary:  Positive for frequency and urgency. Negative for dysuria.  Musculoskeletal:  Positive for back pain, joint pain and neck pain. Negative for falls.  Skin:  Negative for rash.  Neurological:  Positive for tremors. Negative  for dizziness, loss of consciousness and headaches.  Endo/Heme/Allergies:  Negative for environmental allergies.  Psychiatric/Behavioral:  Negative for depression. The patient is not nervous/anxious.        Objective:    Physical Exam Constitutional:      General: She is not in acute distress.    Appearance: Normal appearance. She is well-developed. She is not toxic-appearing.  HENT:     Head: Normocephalic and atraumatic.     Right Ear: External ear normal.     Left Ear: External ear normal.     Nose: Nose normal.  Eyes:     General:        Right eye: No discharge.        Left eye: No discharge.     Conjunctiva/sclera: Conjunctivae normal.  Neck:     Thyroid : No thyromegaly.   Cardiovascular:     Rate and Rhythm: Normal rate and regular rhythm.     Heart sounds: Normal heart sounds. No murmur heard. Pulmonary:     Effort: Pulmonary effort is normal. No respiratory distress.     Breath sounds: Normal breath sounds.  Abdominal:     General: Bowel sounds are normal.     Palpations: Abdomen is soft.     Tenderness: There is no abdominal tenderness. There is no guarding.  Musculoskeletal:        General: Normal range of motion.     Cervical back: Neck supple.  Lymphadenopathy:     Cervical: No cervical adenopathy.  Skin:    General: Skin is warm and dry.  Neurological:     Mental Status: She is alert and oriented to person, place, and time.  Psychiatric:        Mood and Affect: Mood normal.        Behavior: Behavior normal.        Thought Content: Thought content normal.        Judgment: Judgment normal.    BP 116/68   Pulse 83   Temp 97.7 F (36.5 C)   Resp 16   Ht 5\' 2"  (1.575 m)   Wt 112 lb 6.4 oz (51 kg)   LMP 01/13/2013 Comment: spotting-had benign endometrial biopsy   SpO2 98%   BMI 20.56 kg/m  Wt Readings from Last 3 Encounters:  03/12/24 112 lb 6.4 oz (51 kg)  11/15/23 112 lb 12.8 oz (51.2 kg)  05/30/23 108 lb 12.8 oz (49.4 kg)    Diabetic Foot Exam - Simple   No data filed    Lab Results  Component Value Date   WBC 5.9 11/22/2023   HGB 13.8 11/22/2023   HCT 41.1 11/22/2023   PLT 222.0 11/22/2023   GLUCOSE 94 11/22/2023   CHOL 143 11/22/2023   TRIG 85.0 11/22/2023   HDL 57.30 11/22/2023   LDLDIRECT 120.0 12/11/2014   LDLCALC 68 11/22/2023   ALT 4 11/22/2023   AST 22 11/22/2023   NA 129 (L) 11/22/2023   K 3.8 11/22/2023   CL 91 (L) 11/22/2023   CREATININE 0.64 11/22/2023   BUN 21 11/22/2023   CO2 30 11/22/2023   TSH 1.54 11/22/2023   INR 1.0 07/25/2019   HGBA1C 5.6 11/22/2023    Lab Results  Component Value Date   TSH 1.54 11/22/2023   Lab Results  Component Value Date   WBC 5.9 11/22/2023   HGB 13.8  11/22/2023   HCT 41.1 11/22/2023   MCV 90.9 11/22/2023   PLT 222.0 11/22/2023   Lab Results  Component Value Date   NA 129 (L) 11/22/2023   K 3.8 11/22/2023   CO2 30 11/22/2023   GLUCOSE 94 11/22/2023   BUN 21 11/22/2023   CREATININE 0.64 11/22/2023   BILITOT 0.9 11/22/2023   ALKPHOS 49 11/22/2023   AST 22 11/22/2023   ALT 4 11/22/2023   PROT 6.4 11/22/2023   ALBUMIN 4.7 11/22/2023   CALCIUM  9.4 11/22/2023   ANIONGAP 11 11/12/2022   EGFR 77 03/30/2023   GFR 78.11 11/22/2023   Lab Results  Component Value Date   CHOL 143 11/22/2023   Lab Results  Component Value Date   HDL 57.30 11/22/2023   Lab Results  Component Value Date   LDLCALC 68 11/22/2023   Lab Results  Component Value Date   TRIG 85.0 11/22/2023   Lab Results  Component Value Date   CHOLHDL 2 11/22/2023   Lab Results  Component Value Date   HGBA1C 5.6 11/22/2023       Assessment & Plan:  Depression with anxiety Assessment & Plan: Continue to monitor   Coronary artery disease involving native coronary artery, unspecified whether angina present, unspecified whether native or transplanted heart Assessment & Plan: asymptomatic   Primary hypertension Assessment & Plan: Well controlled, no changes to meds. Encouraged heart healthy diet such as the DASH diet and exercise as tolerated.     Pure hypercholesterolemia Assessment & Plan: Tolerating statin, encouraged heart healthy diet, avoid trans fats, minimize simple carbs and saturated fats. Increase exercise as tolerated   Insomnia, unspecified type Assessment & Plan: Has trouble maintaining sleep due to tremor but diazepam  is helpful    Parkinson's disease, unspecified whether dyskinesia present, unspecified whether manifestations fluctuate (HCC) Assessment & Plan: Following with neurology   Vitamin D  deficiency Assessment & Plan: Supplement and monitor    Other orders -     Sertraline  HCl; Take 0.5-1 tablets (12.5-25 mg  total) by mouth at bedtime. -     diazePAM ; Take 0.5 tablets (2.5 mg total) by mouth every 12 (twelve) hours as needed for anxiety.  Dispense: 30 tablet; Refill: 2    Assessment and Plan Assessment & Plan Wellness Visit Routine wellness visit with well-controlled blood pressure and no significant issues.  Urinary Frequency Increased urinary frequency, 2-4 times per night. Amlodipine  moved to dinnertime, providing slight improvement. No infection or hematuria.  Primary Hypertension Blood pressure well-controlled with current medication regimen.  Hyponatremia Sodium levels slightly off. Blood work to be repeated in May. - Repeat blood work in May.  Parkinson's Disease Managed with Rytary . Experiences hot flashes and dry mouth as side effects. Dyskinesia present but not worsening.  Anxiety Anxiety management discussed. Sertraline  prescribed at 25 mg, option to start at 12.5 mg. Concerns about interaction with Rytary  and side effects like CNS depression and psychomotor impairment addressed. Decision to start based on stress levels and quality of life. - Consider starting sertraline  at 12.5 mg if anxiety becomes unmanageable. - Monitor for side effects if sertraline  is initiated.  General Health Maintenance Tetanus vaccination due. Discussed importance in case of injury. - Advise tetanus vaccination if injured or available at pharmacy.  Follow-up Follow-up plans for future visits and blood work discussed. - Schedule follow-up visit in 3-4 months. - Coordinate blood work with Dr. Charl Concha appointment in May.     Randie Bustle, MD

## 2024-03-23 ENCOUNTER — Ambulatory Visit: Admitting: Podiatry

## 2024-03-29 DIAGNOSIS — G20A1 Parkinson's disease without dyskinesia, without mention of fluctuations: Secondary | ICD-10-CM | POA: Diagnosis not present

## 2024-03-30 ENCOUNTER — Ambulatory Visit: Admitting: Podiatry

## 2024-03-30 ENCOUNTER — Encounter: Payer: Self-pay | Admitting: Podiatry

## 2024-03-30 DIAGNOSIS — M79672 Pain in left foot: Secondary | ICD-10-CM

## 2024-03-30 DIAGNOSIS — B351 Tinea unguium: Secondary | ICD-10-CM

## 2024-03-30 DIAGNOSIS — M79671 Pain in right foot: Secondary | ICD-10-CM | POA: Diagnosis not present

## 2024-03-30 NOTE — Progress Notes (Signed)
 Patient presents for evaluation and treatment of tenderness and some redness around nails feet.  Tenderness around toes with walking and wearing shoes.  Physical exam:  General appearance: Alert, pleasant, and in no acute distress.  Vascular: Pedal pulses: DP palpable laterally, PT nonpalpable bilaterally.  Mild edema lower legs bilaterally  Neurological:  No burning or paresthesias noted bilaterally  Dermatologic:  Nails thickened, disfigured, discolored 1-5 BL with subungual debris.  Redness and hypertrophic nail folds along nail folds bilaterally but no signs of drainage or infection.  Musculoskeletal:     Diagnosis: 1.  Painful onychomycotic nails 1 through 5 bilaterally. 2. Pain toes 1 through 5 bilaterally.  Plan: Debrided onychomycotic nails 1 through 5 bilaterally.  Return 3 months  RFC

## 2024-04-02 ENCOUNTER — Ambulatory Visit: Attending: Cardiology | Admitting: Cardiology

## 2024-04-02 ENCOUNTER — Encounter: Payer: Self-pay | Admitting: Cardiology

## 2024-04-02 VITALS — BP 148/82 | HR 83 | Ht 62.0 in | Wt 113.2 lb

## 2024-04-02 DIAGNOSIS — I2510 Atherosclerotic heart disease of native coronary artery without angina pectoris: Secondary | ICD-10-CM | POA: Diagnosis not present

## 2024-04-02 DIAGNOSIS — I10 Essential (primary) hypertension: Secondary | ICD-10-CM | POA: Insufficient documentation

## 2024-04-02 DIAGNOSIS — E7800 Pure hypercholesterolemia, unspecified: Secondary | ICD-10-CM | POA: Insufficient documentation

## 2024-04-02 DIAGNOSIS — R0602 Shortness of breath: Secondary | ICD-10-CM | POA: Insufficient documentation

## 2024-04-02 NOTE — Progress Notes (Signed)
 Date:  04/02/2024   ID:  Stephanie [name], DOB 09/04/1932, MRN [id number] The patient was identified using 2 identifiers. PCP:  Neda Balk, MD  Cardiologist:  Gaylyn Keas, MD  Electrophysiologist:  None   Evaluation Performed:  Follow-Up Visit  Chief Complaint:  HTN  History of Present Illness:    Stephanie [name] is a 89 y.o. female with a hx of HTN, psoriatic arthritis and HLD.She is here today for followup and is doing well.  She has some mild DOE that she has after getting up in the am and walking down a long hall to get to the kitchen.  She has Park[place] and bends over a lot to walk.  She saw a pulmonologist for the SOB and workup was normal but felt that her posture was playing a role.  She is going to a gym to help with her posture. She denies any chest pain or pressure, PND, orthopnea, LE edema, dizziness, palpitations or syncope. She is compliant with her meds and is tolerating meds with no SE.    Past Medical History:  Diagnosis Date   Allergy    Anemia    past hx    Arthropathy of cervical spine 11/27/2012   Right  Xray at [facility]  On 04/08/2016  AP, lateral, and lateral flexion and extension views of the cervical spine are submitted for evaluation.   No acute fracture identified in the cervical spine.   There is redemonstration of approximately 2 mm of C3 on C4 anterolisthesis in neutral which slightly increases in flexion relative to neutral and extension. Slight C5 on C6 retrolisthesis does not change i   Benign hypertension without congestive heart failure    Benign mole    right hcets mole, bleeds when dried with towel   Bilateral dry eyes    Bladder cancer (HCC) 11-01-16   CAD (coronary artery disease), native coronary artery 04/2023   Coronary CTA showed a coronary calcium  score to 46 with less than 25% left main, 25 to 49% proximal LAD, less than 25% left circumflex stenosis   Cataract    Cervical spondylosis    Cervicalgia    2 screws in place    Chronic  kidney disease    Depression with anxiety November 01, 2016   prior to husbands death    Dyspnea    Gross hematuria    developed after first hip replacment, led to indicental finding of bladder tumor, bleeding now resolved    H/O total shoulder replacement, left    History of kidney stones    1970's   Hyperlipidemia    Left hand weakness    due to reinjury of flxor tendon s/p tendon surgery march 12-2018   Lumbar stenosis    Malignant tumor of urinary bladder (HCC) 07/2016   Pre-stage I   OA (osteoarthritis)    Parkinson disease [facility]) dx 2012   neurologist-  dr siddiqui at [facility]   Psoriatic arthritis [facility])     Dr Meredith Stalls , [place] [facility]    Rupture of flexor tendon of hand 2020   right   Past Surgical History:  Procedure Laterality Date   BIOPSY MASS LEFT FIRST METACARPAL   06/23/2006   benign   CATARACT EXTRACTION W/ INTRAOCULAR LENS  IMPLANT, BILATERAL  1999 and 2003   CERVICAL FUSION  02/2017   c1-c2 ; reports she has 2 screws 2in long in place done at [facility] ; patient exhibits VERY LIMITED NECK ROM   COLONOSCOPY  COSMETIC SURGERY     CYSTOSCOPY W/ RETROGRADES Bilateral 09/22/2016   Procedure: CYSTOSCOPY WITH RETROGRADE PYELOGRAM;  Surgeon: Osborn Blaze, MD;  Location: [facility];  Service: Urology;  Laterality: Bilateral;   D & C HYSTEROSCOPY W/ RESECTION POLYP  07/11/2000   DILATION AND CURETTAGE OF UTERUS  1960's   EXCISION CYST AND DEBRIDEMENT RIGHT WIRST AND REMOVAL FORGEIGN BODY  01/09/2009   EYE SURGERY     FLEXOR TENDON REPAIR Left 12/12/2018   Procedure: REPAIR/TRANSFER FLEXOR DIGITORUM PROFUNDUS OF LEFT SMALL FINGER;  Surgeon: Lyanne Sample, MD;  Location: [facility];  Service: Orthopedics;  Laterality: Left;   JOINT REPLACEMENT     LAPAROSCOPY  yrs ago   infertility    METACARPOPHALANGEAL JOINT ARTHRODESIS Right 05/25/1996   REVERSE SHOULDER ARTHROPLASTY Left 10/14/2022   Procedure: REVERSE SHOULDER ARTHROPLASTY;  Surgeon:  Ellard Gunning, MD;  Location: [facility];  Service: Orthopedics;  Laterality: Left;    SPINE SURGERY     TENDON REPAIR Left 01/15/2019   Hand   TONSILLECTOMY AND ADENOIDECTOMY  child   TOTAL HIP ARTHROPLASTY Left 07/07/2016   Procedure: LEFT TOTAL HIP ARTHROPLASTY ANTERIOR APPROACH;  Surgeon: Liliane Rei, MD;  Location: [facility];  Service: Orthopedics;  Laterality: Left;   TOTAL HIP ARTHROPLASTY Right 09/28/2017   Procedure: RIGHT TOTAL HIP ARTHROPLASTY ANTERIOR APPROACH;  Surgeon: Liliane Rei, MD;  Location: [facility];  Service: Orthopedics;  Laterality: Right;   TOTAL KNEE ARTHROPLASTY Right 07/30/2019   Procedure: TOTAL KNEE ARTHROPLASTY;  Surgeon: Liliane Rei, MD;  Location: [facility];  Service: Orthopedics;  Laterality: Right;    TRANSURETHRAL RESECTION OF BLADDER TUMOR N/A 09/22/2016   Procedure: TRANSURETHRAL RESECTION OF BLADDER TUMOR (TURBT);  Surgeon: Osborn Blaze, MD;  Location: [facility];  Service: Urology;  Laterality: N/A;     Current Meds  Medication Sig   amLODipine  (NORVASC ) 5 MG tablet TAKE 1 TABLET (5 MG TOTAL) BY MOUTH DAILY.   Apoaequorin (PREVAGEN PO) Take 1 tablet by mouth in the morning.   aspirin EC 81 MG tablet Take 81 mg by mouth daily. Swallow whole.   Carbidopa -Levodopa  ER (RYTARY ) 48.75-195 MG CPCR Take 2 capsules by mouth 4 (four) times daily.   cholecalciferol (VITAMIN D3) 25 MCG (1000 UNIT) tablet Take 1,000 Units by mouth in the morning.   cholestyramine  (QUESTRAN ) 4 g packet Take 1 packet (4 g total) by mouth 2 (two) times daily as needed.   Coenzyme Q10 (COQ10 PO) Take 1 tablet by mouth in the morning.   diazepam  (VALIUM ) 5 MG tablet Take 0.5 tablets (2.5 mg total) by mouth every 12 (twelve) hours as needed for anxiety.   DIGESTIVE ENZYMES PO Take 1 capsule by mouth 3 (three) times daily before meals. Active Enzymes by Larissa Plowman   EPINEPHrine  0.3 mg/0.3 mL IJ SOAJ injection Inject 0.3 mg into the muscle as needed for  anaphylaxis.   fluticasone  (CUTIVATE ) 0.05 % cream Apply 1 application topically daily as needed (scalp psoriasis).   golimumab  (SIMPONI  ARIA) 50 MG/4ML SOLN injection Inject 50 mg into the vein See admin instructions. Given every 2 months - Last given 06/28/2019   hydrochlorothiazide  (MICROZIDE ) 12.5 MG capsule TAKE 1 CAPSULE BY MOUTH EVERY DAY   melatonin 5 MG TABS Take 5 mg by mouth at bedtime.   meloxicam  (MOBIC ) 15 MG tablet Take 15 mg by mouth daily as needed (joint pain (max 3-4 times/weekly)).   Multiple Vitamin (MULTIVITAMIN WITH MINERALS) TABS tablet Take 1  tablet by mouth in the morning. Centrum Silver   Polyethyl Glycol-Propyl Glycol (SYSTANE) 0.4-0.3 % SOLN Place 1-2 drops into both eyes 3 (three) times daily as needed (dry/irritated eyes.).   Probiotic Product (PROBIOTIC PO) Take 1 capsule by mouth in the morning and at bedtime. Restore Ultimate Probiotic   sertraline  (ZOLOFT ) 25 MG tablet Take 0.5-1 tablets (12.5-25 mg total) by mouth at bedtime.     Allergies:   Crestor  [rosuvastatin ], Cleocin [clindamycin], Codeine , Hornet venom, Penicillins, Vibramycin  [doxycycline ], Yellow jacket venom, Lodine [etodolac], and Tramadol   Social History   Tobacco Use   Smoking status: Former    Current packs/day: 0.00    Types: Cigarettes    Start date: 05/04/1954    Quit date: 05/04/1964    Years since quitting: 59.9   Smokeless tobacco: Never   Tobacco comments:    Pt states that when she was smoking 1 pack of cigs would last her 3 days. 10/13/22 ALS   Vaping Use   Vaping status: Never Used  Substance Use Topics   Alcohol use: Yes    Alcohol/week: 7.0 standard drinks of alcohol    Types: 7 Glasses of wine per week    Comment: ocassionally- social    Drug use: No     Family Hx: The patient's family history includes Arthritis in her father, mother, and sister; Bladder Cancer in her father; Deep vein thrombosis in her mother; Diabetes in her sister; Esophageal cancer in her paternal  aunt; Hearing loss in her sister; Heart disease in her maternal grandfather, maternal grandmother, and sister; Hypertension in her mother and sister; Obesity in her sister; Peripheral vascular disease in her paternal grandmother; Stroke in her father and paternal grandfather. There is no history of Colon cancer, Colon polyps, Rectal cancer, or Stomach cancer.  ROS:   Please see the history of present illness.     All other systems reviewed and are negative.   Prior CV studies:   The following studies were reviewed today:  EKG Interpretation Date/Time:  Monday Apr 02 2024 11:25:24 EDT Ventricular Rate:  83 PR Interval:  134 QRS Duration:  68 QT Interval:  348 QTC Calculation: 408 R Axis:   -55  Text Interpretation: Normal sinus rhythm with sinus arrhythmia Possible Left atrial enlargement Left axis deviation Pulmonary disease pattern When compared with ECG of 08-Dec-2018 13:07, No significant change was found Confirmed by Gaylyn Keas ([id number]) on 04/02/2024 11:35:21 AM    Labs/Other Tests and Data Reviewed:      Recent Labs: 11/22/2023: ALT 4; BUN 21; Creatinine, Ser 0.64; Hemoglobin 13.8; Platelets 222.0; Potassium 3.8; Sodium 129; TSH 1.54   Recent Lipid Panel Lab Results  Component Value Date/Time   CHOL 143 11/22/2023 12:51 PM   CHOL 173 12/31/2019 08:52 AM   TRIG 85.0 11/22/2023 12:51 PM   HDL 57.30 11/22/2023 12:51 PM   HDL 58 12/31/2019 08:52 AM   CHOLHDL 2 11/22/2023 12:51 PM   LDLCALC 68 11/22/2023 12:51 PM   LDLCALC 113 (H) 09/02/2020 04:00 PM   LDLDIRECT 120.0 12/11/2014 10:40 AM    Wt Readings from Last 3 Encounters:  04/02/24 113 lb 3.2 oz (51.3 kg)  03/12/24 112 lb 6.4 oz (51 kg)  11/15/23 112 lb 12.8 oz (51.2 kg)     Risk Assessment/Calculations:      Objective:    Vital Signs:  BP (!) 148/82   Pulse 83   Ht 5\' 2"  (1.575 m)   Wt 113 lb 3.2 oz (51.3 kg)  LMP 01/13/2013 Comment: spotting-had benign endometrial biopsy   SpO2 99%   BMI 20.70 kg/m    GEN: Well nourished, well developed in no acute distress HEENT: Normal NECK: No JVD; No carotid bruits LYMPHATICS: No lymphadenopathy CARDIAC:RRR, no murmurs, rubs, gallops RESPIRATORY:  Clear to auscultation without rales, wheezing or rhonchi  ABDOMEN: Soft, non-tender, non-distended MUSCULOSKELETAL:  No edema; No deformity  SKIN: Warm and dry NEUROLOGIC:  Alert and oriented x 3 PSYCHIATRIC:  Normal affect  ASSESSMENT & PLAN:   #HTN -BP borderline controlled on exam today>>BP usually runs 120/54mmHg>>suspect it was stress getting to a new office building today>>I asked her to keep an eye on it at home -Continue amlodipine  5 mg daily, HCTZ 12.5 mg daily with as needed refills -I have personally reviewed and interpreted outside labs performed by patient's PCP which showed SCr 0.75, K+ 3.8 on 12/2023  #HLD -LDL goal <70 -I have personally reviewed and interpreted outside labs performed by patient's PCP which showed LDL 68 and HDL 57 on 11/22/23 and ALT 12 on 12/19/2023 -Continue Zetia  10 mg daily with as needed refills  #ASCAD #Shortness of breath -D-dimer 04/04/2023 was normal -2D echo 04/29/2023 showed EF 60 to 65% with normal diastolic function and normal RV function -Coronary CTA 04/19/2023 showed coronary calcium  score of 246 with <25% left main, 25 to 49% prox LAD, <25% LCx - No anginal symptoms - Continue aspirin 81 mg daily and Zetia  10 mg daily - Statin intolerant   Medication Adjustments/Labs and Tests Ordered: Current medicines are reviewed at length with the patient today.  Concerns regarding medicines are outlined above.   Tests Ordered: Orders Placed This Encounter  Procedures   EKG 12-Lead    Medication Changes: No orders of the defined types were placed in this encounter.   Follow Up:  1 year   Signed, Gaylyn Keas, MD  04/02/2024 11:29 AM    [facility]

## 2024-04-02 NOTE — Patient Instructions (Signed)
 Medication Instructions:  Your physician recommends that you continue on your current medications as directed. Please refer to the Current Medication list given to you today.  *If you need a refill on your cardiac medications before your next appointment, please call your pharmacy*  Lab Work: None.  If you have labs (blood work) drawn today and your tests are completely normal, you will receive your results only by: MyChart Message (if you have MyChart) OR A paper copy in the mail If you have any lab test that is abnormal or we need to change your treatment, we will call you to review the results.  Testing/Procedures: None.  Follow-Up: At [facility], you and your health needs are our priority.  As part of our continuing mission to provide you with exceptional heart care, our providers are all part of one team.  This team includes your primary Cardiologist (physician) and Advanced Practice Providers or APPs (Physician Assistants and Nurse Practitioners) who all work together to provide you with the care you need, when you need it.  Your next appointment:   1 year(s)  Provider:   Gaylyn Keas, MD

## 2024-04-19 DIAGNOSIS — L4050 Arthropathic psoriasis, unspecified: Secondary | ICD-10-CM | POA: Diagnosis not present

## 2024-05-03 DIAGNOSIS — H35371 Puckering of macula, right eye: Secondary | ICD-10-CM | POA: Diagnosis not present

## 2024-05-03 DIAGNOSIS — H35351 Cystoid macular degeneration, right eye: Secondary | ICD-10-CM | POA: Diagnosis not present

## 2024-05-03 DIAGNOSIS — H353131 Nonexudative age-related macular degeneration, bilateral, early dry stage: Secondary | ICD-10-CM | POA: Diagnosis not present

## 2024-05-08 ENCOUNTER — Ambulatory Visit (INDEPENDENT_AMBULATORY_CARE_PROVIDER_SITE_OTHER)

## 2024-05-08 VITALS — Ht 62.0 in | Wt 113.0 lb

## 2024-05-08 DIAGNOSIS — Z0000 Encounter for general adult medical examination without abnormal findings: Secondary | ICD-10-CM | POA: Diagnosis not present

## 2024-05-08 NOTE — Patient Instructions (Signed)
 Ms. Stephanie [name] , Thank you for taking time out of your busy schedule to complete your Annual Wellness Visit with me. I enjoyed our conversation and look forward to speaking with you again next year. I, as well as your care team,  appreciate your ongoing commitment to your health goals. Please review the following plan we discussed and let me know if I can assist you in the future. Your Game plan/ To Do List    Follow up Visits: Next Medicare AWV with our clinical staff: In 1 year    Have you seen your provider in the last 6 months (3 months if uncontrolled diabetes)? Yes Next Office Visit with your provider: 06/20/24 @ 1:40  Clinician Recommendations:  Aim for 30 minutes of exercise or brisk walking, 6-8 glasses of water , and 5 servings of fruits and vegetables each day.       This is a list of the screening recommended for you and due dates:  Health Maintenance  Topic Date Due   COVID-19 Vaccine (8 - Pfizer risk 2024-25 season) 01/25/2024   DTaP/Tdap/Td vaccine (2 - Tdap) 02/26/2024   Mammogram  05/17/2024   Flu Shot  06/15/2024   Medicare Annual Wellness Visit  05/08/2025   Pneumococcal Vaccine for age over 86  Completed   DEXA scan (bone density measurement)  Completed   Zoster (Shingles) Vaccine  Completed   Hepatitis B Vaccine  Aged Out   HPV Vaccine  Aged Out   Meningitis B Vaccine  Aged Out    Advanced directives: (In Chart) A copy of your advanced directives are scanned into your chart should your provider ever need it.  Advance Care Planning is important because it:  [x]  Makes sure you receive the medical care that is consistent with your values, goals, and preferences  [x]  It provides guidance to your family and loved ones and reduces their decisional burden about whether or not they are making the right decisions based on your wishes.  Follow the link provided in your after visit summary or read over the paperwork we have mailed to you to help you started getting your Advance  Directives in place. If you need assistance in completing these, please reach out to us  so that we can help you!  See attachments for Preventive Care and Fall Prevention Tips.

## 2024-05-08 NOTE — Progress Notes (Signed)
 Subjective:   Stephanie [name] is a 89 y.o. who presents for a Medicare Wellness preventive visit.  As a reminder, Annual Wellness Visits don't include a physical exam, and some assessments may be limited, especially if this visit is performed virtually. We may recommend an in-person follow-up visit with your provider if needed.  Visit Complete: Virtual I connected with  Stephanie [name] on 05/08/24 by a audio enabled telemedicine application and verified that I am speaking with the correct person using two identifiers.  Patient Location: Home  Provider Location: Home Office  I discussed the limitations of evaluation and management by telemedicine. The patient expressed understanding and agreed to proceed.  Vital Signs: Because this visit was a virtual/telehealth visit, some criteria may be missing or patient reported. Any vitals not documented were not able to be obtained and vitals that have been documented are patient reported.  VideoDeclined- This patient declined Librarian, academic. Therefore the visit was completed with audio only.  Persons Participating in Visit: Patient.  AWV Questionnaire: Yes: Patient Medicare AWV questionnaire was completed by the patient on 05/01/24; I have confirmed that all information answered by patient is correct and no changes since this date.  Cardiac Risk Factors include: advanced age (>76men, >79 women);hypertension;dyslipidemia     Objective:    Today's Vitals   05/08/24 1617  Weight: 113 lb (51.3 kg)  Height: 5' 2 (1.575 m)   Body mass index is 20.67 kg/m.     05/08/2024    4:21 PM 10/18/2023    1:20 PM 05/05/2023    2:03 PM 02/21/2023   10:23 AM 11/22/2022    2:03 PM 11/12/2022    4:21 PM 10/14/2022    8:07 AM  Advanced Directives  Does Patient Have a Medical Advance Directive? Yes Yes Yes Yes No No Yes  Type of Estate agent of [place];Living will Healthcare Power of [place];Living  will Healthcare Power of [place];Living will Healthcare Power of [place];Living will Healthcare Power of [place];Living will  Healthcare Power of [place];Living will  Does patient want to make changes to medical advance directive? No - Patient declined No - Patient declined No - Patient declined No - Patient declined   No - Patient declined  Copy of Healthcare Power of Attorney in Chart? Yes - validated most recent copy scanned in chart (See row information)  Yes - validated most recent copy scanned in chart (See row information) Yes - validated most recent copy scanned in chart (See row information) Yes - validated most recent copy scanned in chart (See row information)  No - copy requested  Would patient like information on creating a medical advance directive?      No - Patient declined     Current Medications (verified) Outpatient Encounter Medications as of 05/08/2024  Medication Sig   amLODipine  (NORVASC ) 5 MG tablet TAKE 1 TABLET (5 MG TOTAL) BY MOUTH DAILY.   Apoaequorin (PREVAGEN PO) Take 1 tablet by mouth in the morning.   aspirin EC 81 MG tablet Take 81 mg by mouth daily. Swallow whole.   Carbidopa -Levodopa  ER (RYTARY ) 48.75-195 MG CPCR Take 2 capsules by mouth 4 (four) times daily.   cholecalciferol (VITAMIN D3) 25 MCG (1000 UNIT) tablet Take 1,000 Units by mouth in the morning.   cholestyramine  (QUESTRAN ) 4 g packet Take 1 packet (4 g total) by mouth 2 (two) times daily as needed.   Coenzyme Q10 (COQ10 PO) Take 1 tablet by mouth in the morning.  diazepam  (VALIUM ) 5 MG tablet Take 0.5 tablets (2.5 mg total) by mouth every 12 (twelve) hours as needed for anxiety.   DIGESTIVE ENZYMES PO Take 1 capsule by mouth 3 (three) times daily before meals. Active Enzymes by Vernita Hawks   EPINEPHrine  0.3 mg/0.3 mL IJ SOAJ injection Inject 0.3 mg into the muscle as needed for anaphylaxis.   ezetimibe  (ZETIA ) 10 MG tablet Take 1 tablet (10 mg total) by mouth daily. (Patient taking differently:  Take 5 mg by mouth daily.)   fluticasone  (CUTIVATE ) 0.05 % cream Apply 1 application topically daily as needed (scalp psoriasis).   golimumab  (SIMPONI  ARIA) 50 MG/4ML SOLN injection Inject 50 mg into the vein See admin instructions. Given every 2 months - Last given 06/28/2019   hydrochlorothiazide  (MICROZIDE ) 12.5 MG capsule TAKE 1 CAPSULE BY MOUTH EVERY DAY   melatonin 5 MG TABS Take 5 mg by mouth at bedtime.   meloxicam  (MOBIC ) 15 MG tablet Take 15 mg by mouth daily as needed (joint pain (max 3-4 times/weekly)).   Multiple Vitamin (MULTIVITAMIN WITH MINERALS) TABS tablet Take 1 tablet by mouth in the morning. Centrum Silver   Polyethyl Glycol-Propyl Glycol (SYSTANE) 0.4-0.3 % SOLN Place 1-2 drops into both eyes 3 (three) times daily as needed (dry/irritated eyes.).   Probiotic Product (PROBIOTIC PO) Take 1 capsule by mouth in the morning and at bedtime. Restore Ultimate Probiotic   sertraline  (ZOLOFT ) 25 MG tablet Take 0.5-1 tablets (12.5-25 mg total) by mouth at bedtime.   No facility-administered encounter medications on file as of 05/08/2024.    Allergies (verified) Crestor  [rosuvastatin ], Cleocin [clindamycin], Codeine , Hornet venom, Penicillins, Vibramycin  [doxycycline ], Yellow jacket venom, Lodine [etodolac], and Tramadol   History: Past Medical History:  Diagnosis Date   Allergy    Anemia    past hx    Arthropathy of cervical spine 11/27/2012   Right  Xray at [facility]  On 04/08/2016  AP, lateral, and lateral flexion and extension views of the cervical spine are submitted for evaluation.   No acute fracture identified in the cervical spine.   There is redemonstration of approximately 2 mm of C3 on C4 anterolisthesis in neutral which slightly increases in flexion relative to neutral and extension. Slight C5 on C6 retrolisthesis does not change i   Benign hypertension without congestive heart failure    Benign mole    right hcets mole, bleeds when dried with towel   Bilateral dry eyes     Bladder cancer (HCC) 2016-10-20   CAD (coronary artery disease), native coronary artery 04/2023   Coronary CTA showed a coronary calcium  score to 46 with less than 25% left main, 25 to 49% proximal LAD, less than 25% left circumflex stenosis   Cataract    Cervical spondylosis    Cervicalgia    2 screws in place    Chronic kidney disease    Depression with anxiety 10/20/16   prior to husbands death    Dyspnea    Gross hematuria    developed after first hip replacment, led to indicental finding of bladder tumor, bleeding now resolved    H/O total shoulder replacement, left    History of kidney stones    1970's   Hyperlipidemia    Left hand weakness    due to reinjury of flxor tendon s/p tendon surgery march 12-2018   Lumbar stenosis    Malignant tumor of urinary bladder (HCC) 07/2016   Pre-stage I   OA (osteoarthritis)    Parkinson disease (HCC) dx  2012   neurologist-  dr siddiqui at [facility]   Psoriatic arthritis [facility])     Dr Ishmael , [place] [facility]    Rupture of flexor tendon of hand 2020   right   Past Surgical History:  Procedure Laterality Date   BIOPSY MASS LEFT FIRST METACARPAL   06/23/2006   benign   CATARACT EXTRACTION W/ INTRAOCULAR LENS  IMPLANT, BILATERAL  1999 and 2003   CERVICAL FUSION  02/2017   c1-c2 ; reports she has 2 screws 2in long in place done at [facility] ; patient exhibits VERY LIMITED NECK ROM   COLONOSCOPY     COSMETIC SURGERY     CYSTOSCOPY W/ RETROGRADES Bilateral 09/22/2016   Procedure: CYSTOSCOPY WITH RETROGRADE PYELOGRAM;  Surgeon: Ricardo Likens, MD;  Location: [facility];  Service: Urology;  Laterality: Bilateral;   D & C HYSTEROSCOPY W/ RESECTION POLYP  07/11/2000   DILATION AND CURETTAGE OF UTERUS  1960's   EXCISION CYST AND DEBRIDEMENT RIGHT WIRST AND REMOVAL FORGEIGN BODY  01/09/2009   EYE SURGERY     FLEXOR TENDON REPAIR Left 12/12/2018   Procedure: REPAIR/TRANSFER FLEXOR DIGITORUM PROFUNDUS OF LEFT SMALL FINGER;   Surgeon: Murrell Kuba, MD;  Location: [facility];  Service: Orthopedics;  Laterality: Left;   JOINT REPLACEMENT     LAPAROSCOPY  yrs ago   infertility    METACARPOPHALANGEAL JOINT ARTHRODESIS Right 05/25/1996   REVERSE SHOULDER ARTHROPLASTY Left 10/14/2022   Procedure: REVERSE SHOULDER ARTHROPLASTY;  Surgeon: Melita Drivers, MD;  Location: [facility];  Service: Orthopedics;  Laterality: Left;    SPINE SURGERY     TENDON REPAIR Left 01/15/2019   Hand   TONSILLECTOMY AND ADENOIDECTOMY  child   TOTAL HIP ARTHROPLASTY Left 07/07/2016   Procedure: LEFT TOTAL HIP ARTHROPLASTY ANTERIOR APPROACH;  Surgeon: Dempsey Moan, MD;  Location: [facility];  Service: Orthopedics;  Laterality: Left;   TOTAL HIP ARTHROPLASTY Right 09/28/2017   Procedure: RIGHT TOTAL HIP ARTHROPLASTY ANTERIOR APPROACH;  Surgeon: Moan Dempsey, MD;  Location: [facility];  Service: Orthopedics;  Laterality: Right;   TOTAL KNEE ARTHROPLASTY Right 07/30/2019   Procedure: TOTAL KNEE ARTHROPLASTY;  Surgeon: Moan Dempsey, MD;  Location: [facility];  Service: Orthopedics;  Laterality: Right;    TRANSURETHRAL RESECTION OF BLADDER TUMOR N/A 09/22/2016   Procedure: TRANSURETHRAL RESECTION OF BLADDER TUMOR (TURBT);  Surgeon: Ricardo Likens, MD;  Location[facility];  Service: Urology;  Laterality: N/A;   Family History  Problem Relation Age of Onset   Arthritis Mother    Hypertension Mother    Deep vein thrombosis Mother        recurrent, secondary to Bleeding disorder   Arthritis Father    Stroke Father    Bladder Cancer Father    Cancer Father    Diabetes Sister    Heart disease Sister    Obesity Sister    Arthritis Sister    Hypertension Sister    Heart disease Maternal Grandmother    Heart disease Maternal Grandfather    Peripheral vascular disease Paternal Grandmother        s/p leg amputation   Stroke Paternal Grandfather    Esophageal cancer Paternal Aunt    Hearing loss Sister     Arthritis Sister    Colon cancer Neg Hx    Colon polyps Neg Hx    Rectal cancer Neg Hx    Stomach cancer Neg Hx    Social History   Socioeconomic History  Marital status: Widowed    Spouse name: Not on file   Number of children: Not on file   Years of education: Not on file   Highest education level: Bachelor's degree (e.g., BA, AB, BS)  Occupational History   Not on file  Tobacco Use   Smoking status: Former    Current packs/day: 0.00    Average packs/day: 0.3 packs/day for 10.0 years (2.5 ttl pk-yrs)    Types: Cigarettes    Start date: 05/04/1954    Quit date: 05/04/1964    Years since quitting: 60.0   Smokeless tobacco: Never   Tobacco comments:    Pt states that when she was smoking 1 pack of cigs would last her 3 days. 10/13/22 ALS   Vaping Use   Vaping status: Never Used  Substance and Sexual Activity   Alcohol use: Yes    Alcohol/week: 1.0 standard drink of alcohol    Types: 1 Glasses of wine per week    Comment: ocassionally- social    Drug use: No   Sexual activity: Not Currently    Partners: Male  Other Topics Concern   Not on file  Social History Narrative   Not on file   Social Drivers of Health   Financial Resource Strain: Low Risk  (05/01/2024)   Overall Financial Resource Strain (CARDIA)    Difficulty of Paying Living Expenses: Not hard at all  Food Insecurity: No Food Insecurity (05/01/2024)   Hunger Vital Sign    Worried About Running Out of Food in the Last Year: Never true    Ran Out of Food in the Last Year: Never true  Transportation Needs: No Transportation Needs (05/01/2024)   PRAPARE - Administrator, Civil Service (Medical): No    Lack of Transportation (Non-Medical): No  Physical Activity: Insufficiently Active (05/01/2024)   Exercise Vital Sign    Days of Exercise per Week: 3 days    Minutes of Exercise per Session: 30 min  Stress: No Stress Concern Present (05/01/2024)   [employer] of Occupational Health -  Occupational Stress Questionnaire    Feeling of Stress: Not at all  Social Connections: Moderately Integrated (05/01/2024)   Social Connection and Isolation Panel    Frequency of Communication with Friends and Family: More than three times a week    Frequency of Social Gatherings with Friends and Family: Once a week    Attends Religious Services: More than 4 times per year    Active Member of [employer] or Organizations: Yes    Attends Banker Meetings: More than 4 times per year    Marital Status: Widowed    Tobacco Counseling Counseling given: Not Answered Tobacco comments: Pt states that when she was smoking 1 pack of cigs would last her 3 days. 10/13/22 ALS     Clinical Intake:  Pre-visit preparation completed: Yes  Pain : No/denies pain     Diabetes: No  Lab Results  Component Value Date   HGBA1C 5.6 11/22/2023   HGBA1C 5.6 02/28/2023   HGBA1C 5.1 02/14/2020     How often do you need to have someone help you when you read instructions, pamphlets, or other written materials from your doctor or pharmacy?: 1 - Never  Interpreter Needed?: No  Information entered by :: Charmaine Bloodgood LPN   Activities of Daily Living     05/01/2024    3:56 PM  In your present state of health, do you have any difficulty performing the following  activities:  Hearing? 0  Vision? 0  Difficulty concentrating or making decisions? 0  Walking or climbing stairs? 0  Dressing or bathing? 0  Doing errands, shopping? 0  Preparing Food and eating ? N  Using the Toilet? N  In the past six months, have you accidently leaked urine? N  Do you have problems with loss of bowel control? N  Managing your Medications? N  Managing your Finances? N  Housekeeping or managing your Housekeeping? N    Patient Care Team: Domenica Harlene LABOR, MD as PCP - General (Family Medicine) Shlomo Wilbert SAUNDERS, MD as PCP - Cardiology (Cardiology) Cleotilde Ronal RAMAN, MD as Consulting Physician (Gynecology) Rosalia Hollering, MD as Referring Physician (Neurology) Alvaro Ricardo KATHEE Raddle., MD as Consulting Physician (Urology) Octavia, Charlie Hamilton, MD as Consulting Physician (Ophthalmology) Pamelia Morene BIRCH, DC (Chiropractic Medicine) Robinson Pao, MD as Consulting Physician (Dermatology) Day, [place], [facility] (Inactive) as Pharmacist (Pharmacist)  I have updated your Care Teams any recent Medical Services you may have received from other providers in the past year.     Assessment:   This is a routine wellness examination for Stephanie [name].  Hearing/Vision screen Hearing Screening - Comments:: Denies hearing difficulties   Vision Screening - Comments:: Wears rx glasses - up to date with routine eye exams with Dr Octavia and Dr. Elner    Goals Addressed             This Visit's Progress    Increase physical activity   On track    Remain active and independent   On track      Depression Screen     05/08/2024    4:20 PM 08/10/2023   11:50 AM 06/21/2023    4:13 PM 06/13/2023   11:03 AM 05/30/2023   11:52 AM 05/10/2023   10:42 AM 05/05/2023    2:33 PM  PHQ 2/9 Scores  PHQ - 2 Score 0 2 2 2  0 2 0  PHQ- 9 Score  6 6 6  0 6     Fall Risk     05/01/2024    3:56 PM 05/30/2023   11:52 AM 04/28/2023    9:41 AM 02/28/2023   10:15 AM 11/29/2022   10:59 AM  Fall Risk   Falls in the past year? 0 0 0 0 0  Number falls in past yr: 0 0 0 0 0  Injury with Fall? 0 0 0 0 0  Risk for fall due to : No Fall Risks  No Fall Risks    Follow up Falls prevention discussed;Education provided;Falls evaluation completed Falls evaluation completed Falls evaluation completed Falls evaluation completed Falls evaluation completed      Data saved with a previous flowsheet row definition    MEDICARE RISK AT HOME:  Medicare Risk at Home Any stairs in or around the home?: (Patient-Rptd) Yes If so, are there any without handrails?: (Patient-Rptd) No Home free of loose throw rugs in walkways, pet beds, electrical cords, etc?:  (Patient-Rptd) Yes Adequate lighting in your home to reduce risk of falls?: (Patient-Rptd) Yes Life alert?: (Patient-Rptd) Yes Use of a cane, walker or w/c?: (Patient-Rptd) No Grab bars in the bathroom?: (Patient-Rptd) Yes Shower chair or bench in shower?: (Patient-Rptd) Yes Elevated toilet seat or a handicapped toilet?: (Patient-Rptd) Yes  TIMED UP AND GO:  Was the test performed?  No  Cognitive Function: Declined/Normal: No cognitive concerns noted by patient or family. Patient alert, oriented, able to answer questions appropriately and recall recent events. No  signs of memory loss or confusion.    10/29/2016   10:43 AM  MMSE - Mini Mental State Exam  Orientation to time 5   Orientation to Place 5   Registration 3   Attention/ Calculation 5   Recall 3   Language- name 2 objects 2   Language- repeat 1  Language- follow 3 step command 3   Language- read & follow direction 1   Write a sentence 1   Copy design 0   Total score 29      Data saved with a previous flowsheet row definition        05/05/2023    2:39 PM  6CIT Screen  What Year? 0 points  What month? 0 points  What time? 0 points  Count back from 20 0 points  Months in reverse 0 points  Repeat phrase 0 points  Total Score 0 points    Immunizations Immunization History  Administered Date(s) Administered   Fluad Quad(high Dose 65+) 07/30/2022   Influenza Split 06/29/2019   Influenza Whole 08/15/2012   Influenza, High Dose Seasonal PF 08/19/2015, 08/27/2017, 08/15/2018, 07/12/2020, 07/29/2021, 07/28/2023   Influenza,inj,quad, With Preservative 08/15/2017, 08/15/2018   Influenza-Unspecified 08/15/2014, 08/24/2016, 08/15/2018   PFIZER Comirnaty(Gray Top)Covid-19 Tri-Sucrose Vaccine 05/15/2021   PFIZER(Purple Top)SARS-COV-2 Vaccination 11/25/2019, 12/16/2019, 08/19/2020   Pfizer Covid-19 Vaccine Bivalent Booster 23yrs & up 10/14/2021   Pfizer(Comirnaty)Fall Seasonal Vaccine 12 years and older 07/28/2023    Pneumococcal Conjugate PCV 7 11/18/2014   Pneumococcal Conjugate-13 12/11/2014   Pneumococcal Polysaccharide-23 11/24/2011   Pneumococcal-Unspecified 11/18/2014   RSV,unspecified 09/23/2022   Td 02/25/2014   Unspecified SARS-COV-2 Vaccination 08/06/2022   Zoster Recombinant(Shingrix) 01/20/2018, 04/07/2018   Zoster, Live 08/27/2011   Zoster, Unspecified 04/07/2018    Screening Tests Health Maintenance  Topic Date Due   COVID-19 Vaccine (8 - Pfizer risk 2024-25 season) 01/25/2024   DTaP/Tdap/Td (2 - Tdap) 02/26/2024   MAMMOGRAM  05/17/2024   INFLUENZA VACCINE  06/15/2024   Medicare Annual Wellness (AWV)  05/08/2025   Pneumococcal Vaccine: 50+ Years  Completed   DEXA SCAN  Completed   Zoster Vaccines- Shingrix  Completed   Hepatitis B Vaccines  Aged Out   HPV VACCINES  Aged Out   Meningococcal B Vaccine  Aged Out    Health Maintenance  Health Maintenance Due  Topic Date Due   COVID-19 Vaccine (8 - Pfizer risk 2024-25 season) 01/25/2024   DTaP/Tdap/Td (2 - Tdap) 02/26/2024    Additional Screening:  Vision Screening: Recommended annual ophthalmology exams for early detection of glaucoma and other disorders of the eye. Would you like a referral to an eye doctor? No    Dental Screening: Recommended annual dental exams for proper oral hygiene  Community Resource Referral / Chronic Care Management: CRR required this visit?  No   CCM required this visit?  No   Plan:    I have personally reviewed and noted the following in the patient's chart:   Medical and social history Use of alcohol, tobacco or illicit drugs  Current medications and supplements including opioid prescriptions. Patient is not currently taking opioid prescriptions. Functional ability and status Nutritional status Physical activity Advanced directives List of other physicians Hospitalizations, surgeries, and ER visits in previous 12 months Vitals Screenings to include cognitive, depression, and  falls Referrals and appointments  In addition, I have reviewed and discussed with patient certain preventive protocols, quality metrics, and best practice recommendations. A written personalized care plan for preventive services as well as general  preventive health recommendations were provided to patient.   Lavelle Pfeiffer [place], [place]   [id number]/75/7974   After Visit Summary: (MyChart) Due to this being a telephonic visit, the after visit summary with patients personalized plan was offered to patient via MyChart   Notes: Nothing significant to report at this time.

## 2024-05-15 DIAGNOSIS — L4050 Arthropathic psoriasis, unspecified: Secondary | ICD-10-CM | POA: Diagnosis not present

## 2024-05-16 DIAGNOSIS — G2589 Other specified extrapyramidal and movement disorders: Secondary | ICD-10-CM | POA: Diagnosis not present

## 2024-05-16 DIAGNOSIS — G243 Spasmodic torticollis: Secondary | ICD-10-CM | POA: Diagnosis not present

## 2024-05-23 DIAGNOSIS — Z1231 Encounter for screening mammogram for malignant neoplasm of breast: Secondary | ICD-10-CM | POA: Diagnosis not present

## 2024-05-23 LAB — HM MAMMOGRAPHY

## 2024-06-01 DIAGNOSIS — M180 Bilateral primary osteoarthritis of first carpometacarpal joints: Secondary | ICD-10-CM | POA: Diagnosis not present

## 2024-06-14 DIAGNOSIS — L4050 Arthropathic psoriasis, unspecified: Secondary | ICD-10-CM | POA: Diagnosis not present

## 2024-06-19 NOTE — Assessment & Plan Note (Signed)
Following with neurology 

## 2024-06-19 NOTE — Assessment & Plan Note (Signed)
 Follows with cardiology for lipid management.  LDL goal less than 70 mg/dl.  Statin intolerant.LDL goal <70. Pt reports she has been taking 5 mg Zetia  daily. Update lipid panel today. Encourage heart healthy diet such as MIND or DASH diet, increase exercise, avoid trans fats, simple carbohydrates and processed foods, consider a krill or fish or flaxseed oil cap daily.

## 2024-06-19 NOTE — Assessment & Plan Note (Signed)
 Has trouble maintaining sleep due to tremor but diazepam  and melatonin helpful

## 2024-06-19 NOTE — Assessment & Plan Note (Signed)
 Well controlled, no changes to meds. Encouraged heart healthy diet such as the DASH diet and exercise as tolerated.

## 2024-06-19 NOTE — Progress Notes (Addendum)
 Subjective:     Patient ID: Stephanie [name], female    DOB: [birth year], 89 y.o.   MRN: [id number]  Chief Complaint  Patient presents with   Medical Management of Chronic Issues    Patient presents today for a 3 month follow-up.   Quality Metric Gaps    TDAP    HPI  Stephanie [name] is a 89 year old female presents for follow-up chronic conditions.  PMHx-bladder cancer, anemia, HTN, CAD, CKD, OA, Parkinson's, psoriatic arthritis, depression w/ anxiety, lumbar stenosis. Living alone. Has 1 daughter lives in [place]. Still driving. Denies recent falls. She is staying active.  She expresses concern about her sister in [place] , who has been experiencing memory issues and recent falls after having Sx. Sister is younger, recently moved to ALF. Reports this as a stressor.  Patient Care Team: Domenica Harlene LABOR, MD as PCP - General (Family Medicine) Shlomo Wilbert SAUNDERS, MD as PCP - Cardiology (Cardiology) Cleotilde Ronal RAMAN, MD as Consulting Physician (Gynecology) Rosalia Hollering, MD as Referring Physician (Neurology) Alvaro Ricardo KATHEE Raddle., MD as Consulting Physician (Urology) Octavia, Charlie Hamilton, MD as Consulting Physician (Ophthalmology) Pamelia Morene BIRCH, DC (Chiropractic Medicine) Robinson Pao, MD as Consulting Physician (Dermatology) Day, [place], [facility] (Inactive) as Pharmacist (Pharmacist)    Followed by cardiology, podiatry, OT, Ortho, neurology, dermatology  HTN Amlodipine  5 mg daily, HCTZ 12.5 mg daily Home BP- 130s/ 60-70s  Parkinson's disease-followed by neurology at [facility] Prevagen 1 tab daily, carbidopa -levodopa ,   Psoriatic Arthritis- followed by Rheumatology- Dr. Lavell On Meloxicam  15 mg daily, reports this was advised by Rheumatology.    Ruptured Tendons in right hand- Dr. Murrell- [facility]-  she has been having increased pain in right hand- was given a hand splint to wear daily and to help with ability to work out at the gym recently. Reports that if  pain continues, may be starting injections with Dr. Kuzma.  HLD-  Zetia  10 mg- Pt reports she is taking 5 mg daily. Per Cardiology, Dr. Garnet- LDL goal >70  Anxiety and depression Diazepam  (Valium ) 5 mg-takes 0.5 tablets by mouth every 12 hours as needed for anxiety- She is taking a 0.5 tab HS for sleep.  Zoloft  25 mg- take 0.5 tab in the morning  Vitamin D  deficiency takes 1000 units vitamin D3 daily  Diarrhea, chronic Reports well controlled. Has not had loose stools, since switching off of Diclofenac . Has not had to take Cholestyraine (QUESTRAN ) 4 G bid -She has not had to use it but would like to keep on her list in case.  Reports taking medications as prescribed.  Denies adverse side effects  Patient reports nocturia and urinary frequency, typically voiding 2-3 times during the night. She notes occasional burning with urination but denies hesitancy, fever, hematuria, or history of urinary tract infections  Patient denies fever, chills, SOB, CP, palpitations, dyspnea, edema, HA, vision changes, N/V/D, abdominal pain, rash, weight changes, and recent illness or hospitalizations.   History of Present Illness              Health Maintenance Due  Topic Date Due   DTaP/Tdap/Td (2 - Tdap) 02/26/2024   INFLUENZA VACCINE  06/15/2024    Past Medical History:  Diagnosis Date   Allergy    Anemia    past hx    Arthropathy of cervical spine 11/27/2012   Right  Xray at [facility]  On 04/08/2016  AP, lateral, and lateral flexion and extension views of the cervical spine are  submitted for evaluation.   No acute fracture identified in the cervical spine.   There is redemonstration of approximately 2 mm of C3 on C4 anterolisthesis in neutral which slightly increases in flexion relative to neutral and extension. Slight C5 on C6 retrolisthesis does not change i   Benign hypertension without congestive heart failure    Benign mole    right hcets mole, bleeds when dried with towel   Bilateral  dry eyes    Bladder cancer (HCC) 10-30-16   CAD (coronary artery disease), native coronary artery 04/2023   Coronary CTA showed a coronary calcium  score to 46 with less than 25% left main, 25 to 49% proximal LAD, less than 25% left circumflex stenosis   Cataract    Cervical spondylosis    Cervicalgia    2 screws in place    Chronic kidney disease    Depression with anxiety 10/30/2016   prior to husbands death    Dyspnea    Gross hematuria    developed after first hip replacment, led to indicental finding of bladder tumor, bleeding now resolved    H/O total shoulder replacement, left    History of kidney stones    1970's   Hyperlipidemia    Left hand weakness    due to reinjury of flxor tendon s/p tendon surgery march 12-2018   Lumbar stenosis    Malignant tumor of urinary bladder (HCC) 07/2016   Pre-stage I   OA (osteoarthritis)    Parkinson disease [facility]) dx 2012   neurologist-  dr siddiqui at [facility]   Psoriatic arthritis [facility])     Dr Ishmael , [place] [facility]    Rupture of flexor tendon of hand 2020   right    Past Surgical History:  Procedure Laterality Date   BIOPSY MASS LEFT FIRST METACARPAL   06/23/2006   benign   CATARACT EXTRACTION W/ INTRAOCULAR LENS  IMPLANT, BILATERAL  1999 and 2003   CERVICAL FUSION  02/2017   c1-c2 ; reports she has 2 screws 2in long in place done at [facility] ; patient exhibits VERY LIMITED NECK ROM   COLONOSCOPY     COSMETIC SURGERY     CYSTOSCOPY W/ RETROGRADES Bilateral 09/22/2016   Procedure: CYSTOSCOPY WITH RETROGRADE PYELOGRAM;  Surgeon: Ricardo Likens, MD;  Location: [facility];  Service: Urology;  Laterality: Bilateral;   D & C HYSTEROSCOPY W/ RESECTION POLYP  07/11/2000   DILATION AND CURETTAGE OF UTERUS  1960's   EXCISION CYST AND DEBRIDEMENT RIGHT WIRST AND REMOVAL FORGEIGN BODY  01/09/2009   EYE SURGERY     FLEXOR TENDON REPAIR Left 12/12/2018   Procedure: REPAIR/TRANSFER FLEXOR DIGITORUM PROFUNDUS OF LEFT  SMALL FINGER;  Surgeon: Murrell Kuba, MD;  Location: [facility];  Service: Orthopedics;  Laterality: Left;   JOINT REPLACEMENT     LAPAROSCOPY  yrs ago   infertility    METACARPOPHALANGEAL JOINT ARTHRODESIS Right 05/25/1996   REVERSE SHOULDER ARTHROPLASTY Left 10/14/2022   Procedure: REVERSE SHOULDER ARTHROPLASTY;  Surgeon: Melita Drivers, MD;  Location: [facility];  Service: Orthopedics;  Laterality: Left;    SPINE SURGERY     TENDON REPAIR Left 01/15/2019   Hand   TONSILLECTOMY AND ADENOIDECTOMY  child   TOTAL HIP ARTHROPLASTY Left 07/07/2016   Procedure: LEFT TOTAL HIP ARTHROPLASTY ANTERIOR APPROACH;  Surgeon: Dempsey Moan, MD;  Location: [facility];  Service: Orthopedics;  Laterality: Left;   TOTAL HIP ARTHROPLASTY Right 09/28/2017   Procedure: RIGHT TOTAL HIP  ARTHROPLASTY ANTERIOR APPROACH;  Surgeon: Melodi Lerner, MD;  Location: [facility];  Service: Orthopedics;  Laterality: Right;   TOTAL KNEE ARTHROPLASTY Right 07/30/2019   Procedure: TOTAL KNEE ARTHROPLASTY;  Surgeon: Melodi Lerner, MD;  Location: [facility];  Service: Orthopedics;  Laterality: Right;    TRANSURETHRAL RESECTION OF BLADDER TUMOR N/A 09/22/2016   Procedure: TRANSURETHRAL RESECTION OF BLADDER TUMOR (TURBT);  Surgeon: Ricardo Likens, MD;  Location: [facility];  Service: Urology;  Laterality: N/A;    Family History  Problem Relation Age of Onset   Arthritis Mother    Hypertension Mother    Deep vein thrombosis Mother        recurrent, secondary to Bleeding disorder   Arthritis Father    Stroke Father    Bladder Cancer Father    Cancer Father    Diabetes Sister    Heart disease Sister    Obesity Sister    Arthritis Sister    Hypertension Sister    Heart disease Maternal Grandmother    Heart disease Maternal Grandfather    Peripheral vascular disease Paternal Grandmother        s/p leg amputation   Stroke Paternal Grandfather    Esophageal cancer Paternal Aunt    Hearing loss  Sister    Arthritis Sister    Colon cancer Neg Hx    Colon polyps Neg Hx    Rectal cancer Neg Hx    Stomach cancer Neg Hx     Social History   Socioeconomic History   Marital status: Widowed    Spouse name: Not on file   Number of children: Not on file   Years of education: Not on file   Highest education level: Bachelor's degree (e.g., BA, AB, BS)  Occupational History   Not on file  Tobacco Use   Smoking status: Former    Current packs/day: 0.00    Average packs/day: 0.3 packs/day for 10.0 years (2.5 ttl pk-yrs)    Types: Cigarettes    Start date: 05/04/1954    Quit date: 05/04/1964    Years since quitting: 60.1   Smokeless tobacco: Never   Tobacco comments:    Pt states that when she was smoking 1 pack of cigs would last her 3 days. 10/13/22 ALS   Vaping Use   Vaping status: Never Used  Substance and Sexual Activity   Alcohol use: Yes    Alcohol/week: 1.0 standard drink of alcohol    Types: 1 Glasses of wine per week    Comment: ocassionally- social    Drug use: No   Sexual activity: Not Currently    Partners: Male  Other Topics Concern   Not on file  Social History Narrative   Not on file   Social Drivers of Health   Financial Resource Strain: Low Risk  (05/01/2024)   Overall Financial Resource Strain (CARDIA)    Difficulty of Paying Living Expenses: Not hard at all  Food Insecurity: Low Risk  (06/01/2024)   Received from [facility]   Hunger Vital Sign    Within the past 12 months, you worried that your food would run out before you got money to buy more: Never true    Within the past 12 months, the food you bought just didn't last and you didn't have money to get more. : Never true  Transportation Needs: No Transportation Needs (06/01/2024)   Received from [facility]    In the past 12 months, has lack  of reliable transportation kept you from medical appointments, meetings, work or from getting things needed for daily living? : No   Physical Activity: Insufficiently Active (05/01/2024)   Exercise Vital Sign    Days of Exercise per Week: 3 days    Minutes of Exercise per Session: 30 min  Stress: No Stress Concern Present (05/01/2024)   [employer] of Occupational Health - Occupational Stress Questionnaire    Feeling of Stress: Not at all  Social Connections: Moderately Integrated (05/01/2024)   Social Connection and Isolation Panel    Frequency of Communication with Friends and Family: More than three times a week    Frequency of Social Gatherings with Friends and Family: Once a week    Attends Religious Services: More than 4 times per year    Active Member of [employer] or Organizations: Yes    Attends Banker Meetings: More than 4 times per year    Marital Status: Widowed  Intimate Partner Violence: Not At Risk (05/08/2024)   Humiliation, Afraid, Rape, and Kick questionnaire    Fear of Current or Ex-Partner: No    Emotionally Abused: No    Physically Abused: No    Sexually Abused: No    Outpatient Medications Prior to Visit  Medication Sig Dispense Refill   amLODipine  (NORVASC ) 5 MG tablet TAKE 1 TABLET (5 MG TOTAL) BY MOUTH DAILY. 90 tablet 3   Apoaequorin (PREVAGEN PO) Take 1 tablet by mouth in the morning.     aspirin EC 81 MG tablet Take 81 mg by mouth daily. Swallow whole.     Carbidopa -Levodopa  ER (RYTARY ) 48.75-195 MG CPCR Take 2 capsules by mouth 4 (four) times daily.     cholecalciferol (VITAMIN D3) 25 MCG (1000 UNIT) tablet Take 1,000 Units by mouth in the morning.     cholestyramine  (QUESTRAN ) 4 g packet Take 1 packet (4 g total) by mouth 2 (two) times daily as needed. 30 each 2   Coenzyme Q10 (COQ10 PO) Take 1 tablet by mouth in the morning.     diazepam  (VALIUM ) 5 MG tablet Take 0.5 tablets (2.5 mg total) by mouth every 12 (twelve) hours as needed for anxiety. 30 tablet 2   DIGESTIVE ENZYMES PO Take 1 capsule by mouth 3 (three) times daily before meals. Active Enzymes by Vernita Hawks      EPINEPHrine  0.3 mg/0.3 mL IJ SOAJ injection Inject 0.3 mg into the muscle as needed for anaphylaxis.     ezetimibe  (ZETIA ) 10 MG tablet Take 1 tablet (10 mg total) by mouth daily. (Patient taking differently: Take 5 mg by mouth daily.) 90 tablet 3   fluticasone  (CUTIVATE ) 0.05 % cream Apply 1 application topically daily as needed (scalp psoriasis).  1   hydrochlorothiazide  (MICROZIDE ) 12.5 MG capsule TAKE 1 CAPSULE BY MOUTH EVERY DAY 90 capsule 1   melatonin 5 MG TABS Take 5 mg by mouth at bedtime.     meloxicam  (MOBIC ) 15 MG tablet Take 15 mg by mouth daily as needed (joint pain (max 3-4 times/weekly)).     Multiple Vitamin (MULTIVITAMIN WITH MINERALS) TABS tablet Take 1 tablet by mouth in the morning. Centrum Silver     Polyethyl Glycol-Propyl Glycol (SYSTANE) 0.4-0.3 % SOLN Place 1-2 drops into both eyes 3 (three) times daily as needed (dry/irritated eyes.).     Probiotic Product (PROBIOTIC PO) Take 1 capsule by mouth in the morning and at bedtime. Restore Ultimate Probiotic     Secukinumab (COSENTYX IV) Inject into the vein.  golimumab  (SIMPONI  ARIA) 50 MG/4ML SOLN injection Inject 50 mg into the vein See admin instructions. Given every 2 months - Last given 06/28/2019     sertraline  (ZOLOFT ) 25 MG tablet Take 0.5-1 tablets (12.5-25 mg total) by mouth at bedtime.     No facility-administered medications prior to visit.    Allergies  Allergen Reactions   Crestor  [Rosuvastatin ] Other (See Comments)    Myalgias    Cleocin [Clindamycin] Diarrhea   Codeine  Nausea And Vomiting   Hornet Venom    Penicillins Itching and Swelling    Has patient had a PCN reaction causing immediate rash, facial/tongue/throat swelling, SOB or lightheadedness with hypotension: Yes Has patient had a PCN reaction causing severe rash involving mucus membranes or skin necrosis: No Has patient had a PCN reaction that required hospitalization: No Has patient had a PCN reaction occurring within the last 10 years:  No If all of the above answers are NO, then may proceed with Cephalosporin use.    Vibramycin  [Doxycycline ] Diarrhea   Yellow Jacket Venom Swelling   Lodine [Etodolac] Rash   Tramadol Itching and Rash    ROS See HPI    Objective:    Physical Exam  General: No acute distress. Awake and conversant.  Eyes: Normal conjunctiva, anicteric. Round symmetric pupils.  ENT: Hearing grossly intact. No nasal discharge.  Neck: Neck is supple. No masses or thyromegaly.  Respiratory: CTAB. Respirations are non-labored. No wheezing.  Skin: Warm. No rashes or ulcers.  Psych: Alert and oriented. Cooperative, Appropriate mood and affect, Normal judgment.  CV: RRR. No murmur. Trace bilateral edema +1 MSK: No clubbing or cyanosis. Normal ambulation. +wrist splint left hand Neuro:  CN II-XII grossly normal.     BP 124/76   Pulse (!) 58   Resp 16   Ht 5' 2 (1.575 m)   Wt 116 lb (52.6 kg)   LMP 01/13/2013 Comment: spotting-had benign endometrial biopsy   SpO2 96%   BMI 21.22 kg/m  Wt Readings from Last 3 Encounters:  06/20/24 116 lb (52.6 kg)  05/08/24 113 lb (51.3 kg)  04/02/24 113 lb 3.2 oz (51.3 kg)       Assessment & Plan:   Problem List Items Addressed This Visit     Anemia - Primary   Update CBC.  Increase leafy greens, consider increased lean red meat and using cast iron cookware. Continue to monitor, report any concerns        Relevant Orders   CBC with Differential/Platelet (Completed)   Depression with anxiety   Stable on current medications.  Pt reports family stressors contributing to anxiety, encouraged counseling services and offered to list counseling services in [place]. Pt denied at this time. Continue to monitor.      HTN (hypertension)   Well controlled, no changes to meds. Encouraged heart healthy diet such as the DASH diet and exercise as tolerated.        Relevant Orders   TSH (Completed)   Hyperlipidemia   Follows with cardiology for lipid management.   LDL goal less than 70 mg/dl.  Statin intolerant.LDL goal <70. Pt reports she has been taking 5 mg Zetia  daily. Update lipid panel today. Encourage heart healthy diet such as MIND or DASH diet, increase exercise, avoid trans fats, simple carbohydrates and processed foods, consider a krill or fish or flaxseed oil cap daily.        Relevant Orders   Lipid panel (Completed)   Insomnia   Has trouble maintaining sleep due  to tremor but diazepam  and melatonin helpful         Parkinson's disease [facility])   Following with neurology.      Preventative health care   Patient encouraged to maintain heart healthy diet, regular exercise, adequate sleep. Consider daily probiotics. Take medications as prescribed.         Psoriatic arthritis (HCC)   Follows w/ Rheumatology, Dr Lavell. She was recently switched to Cosentyx infusion; Golimab was d/c by Rheumatology. Patient reports no adverse side effects following her first infusion.  Reports noticeable improvement, with aches and pain.      Urine frequency   Reprots increased urine frequency, interfering with sleep and ADLS. Hx bladder cancer. Reports nocturia- >4 times night. Refer to Urology for further evaluation.  UA and Urine culture pending to r/o infection.      Relevant Orders   Urinalysis (Completed)   Urine Culture (Completed)   Ambulatory referral to Urology   Vitamin D  deficiency   Supplement and monitor.      Relevant Orders   VITAMIN D  25 Hydroxy (Vit-D Deficiency, Fractures) (Completed)   Other Visit Diagnoses       Hyperglycemia       Relevant Orders   Comprehensive metabolic panel with GFR (Completed)   HgB A1c (Completed)      Update labs today. FU 4 months  Portions of this note were dictated using DRAGON voice recognition software. Please disregard any errors in transcription.     I have discontinued Solash K. Schulke's Simponi  Aria. I am also having her maintain her fluticasone , Probiotic Product (PROBIOTIC PO),  melatonin, cholecalciferol, meloxicam , multivitamin with minerals, Coenzyme Q10 (COQ10 PO), Apoaequorin (PREVAGEN PO), DIGESTIVE ENZYMES PO, EPINEPHrine , Systane, cholestyramine , ezetimibe , Rytary , aspirin EC, hydrochlorothiazide , amLODipine , sertraline , diazepam , and Secukinumab (COSENTYX IV).  No orders of the defined types were placed in this encounter.

## 2024-06-19 NOTE — Assessment & Plan Note (Signed)
Patient encouraged to maintain heart healthy diet, regular exercise, adequate sleep. Consider daily probiotics. Take medications as prescribed 

## 2024-06-19 NOTE — Assessment & Plan Note (Addendum)
 Update CBC.  Increase leafy greens, consider increased lean red meat and using cast iron cookware. Continue to monitor, report any concerns

## 2024-06-19 NOTE — Assessment & Plan Note (Signed)
 Supplement and monitor

## 2024-06-19 NOTE — Assessment & Plan Note (Signed)
Stable on current medications. Continue to monitor.

## 2024-06-20 ENCOUNTER — Ambulatory Visit: Admitting: Student

## 2024-06-20 ENCOUNTER — Encounter: Payer: Self-pay | Admitting: Student

## 2024-06-20 VITALS — BP 124/76 | HR 58 | Resp 16 | Ht 62.0 in | Wt 116.0 lb

## 2024-06-20 DIAGNOSIS — G4700 Insomnia, unspecified: Secondary | ICD-10-CM | POA: Diagnosis not present

## 2024-06-20 DIAGNOSIS — Z0000 Encounter for general adult medical examination without abnormal findings: Secondary | ICD-10-CM

## 2024-06-20 DIAGNOSIS — R350 Frequency of micturition: Secondary | ICD-10-CM | POA: Diagnosis not present

## 2024-06-20 DIAGNOSIS — I10 Essential (primary) hypertension: Secondary | ICD-10-CM

## 2024-06-20 DIAGNOSIS — D649 Anemia, unspecified: Secondary | ICD-10-CM | POA: Diagnosis not present

## 2024-06-20 DIAGNOSIS — F418 Other specified anxiety disorders: Secondary | ICD-10-CM | POA: Diagnosis not present

## 2024-06-20 DIAGNOSIS — E559 Vitamin D deficiency, unspecified: Secondary | ICD-10-CM | POA: Diagnosis not present

## 2024-06-20 DIAGNOSIS — G20A1 Parkinson's disease without dyskinesia, without mention of fluctuations: Secondary | ICD-10-CM

## 2024-06-20 DIAGNOSIS — L4050 Arthropathic psoriasis, unspecified: Secondary | ICD-10-CM

## 2024-06-20 DIAGNOSIS — E7800 Pure hypercholesterolemia, unspecified: Secondary | ICD-10-CM | POA: Diagnosis not present

## 2024-06-20 DIAGNOSIS — R739 Hyperglycemia, unspecified: Secondary | ICD-10-CM | POA: Diagnosis not present

## 2024-06-20 NOTE — Assessment & Plan Note (Signed)
 Reprots increased urine frequency, interfering with sleep and ADLS. Hx bladder cancer. Reports nocturia- >4 times night. Refer to Urology for further evaluation.  UA and Urine culture pending to r/o infection.

## 2024-06-21 ENCOUNTER — Ambulatory Visit: Payer: Self-pay | Admitting: Student

## 2024-06-21 LAB — LIPID PANEL
Cholesterol: 121 mg/dL (ref 0–200)
HDL: 49.1 mg/dL (ref >39.00)
LDL Cholesterol: 51 mg/dL (ref 0–99)
NonHDL: 72.34
Total CHOL/HDL Ratio: 2
Triglycerides: 105 mg/dL (ref 0.0–149.0)
VLDL: 21 mg/dL (ref 0.0–40.0)

## 2024-06-21 LAB — CBC WITH DIFFERENTIAL/PLATELET
Basophils Absolute: 0 K/uL (ref 0.0–0.1)
Basophils Relative: 1 % (ref 0.0–3.0)
Eosinophils Absolute: 0.3 K/uL (ref 0.0–0.7)
Eosinophils Relative: 5.7 % — ABNORMAL HIGH (ref 0.0–5.0)
HCT: 39.3 % (ref 36.0–46.0)
Hemoglobin: 13.1 g/dL (ref 12.0–15.0)
Lymphocytes Relative: 25.7 % (ref 12.0–46.0)
Lymphs Abs: 1.3 K/uL (ref 0.7–4.0)
MCHC: 33.3 g/dL (ref 30.0–36.0)
MCV: 91.3 fl (ref 78.0–100.0)
Monocytes Absolute: 0.5 K/uL (ref 0.1–1.0)
Monocytes Relative: 9.1 % (ref 3.0–12.0)
Neutro Abs: 3 K/uL (ref 1.4–7.7)
Neutrophils Relative %: 58.5 % (ref 43.0–77.0)
Platelets: 160 K/uL (ref 150.0–400.0)
RBC: 4.3 Mil/uL (ref 3.87–5.11)
RDW: 14.5 % (ref 11.5–15.5)
WBC: 5.1 K/uL (ref 4.0–10.5)

## 2024-06-21 LAB — URINALYSIS
Bilirubin Urine: NEGATIVE
Hgb urine dipstick: NEGATIVE
Ketones, ur: NEGATIVE
Leukocytes,Ua: NEGATIVE
Nitrite: NEGATIVE
Specific Gravity, Urine: 1.015 (ref 1.000–1.030)
Total Protein, Urine: NEGATIVE
Urine Glucose: NEGATIVE
Urobilinogen, UA: 0.2 (ref 0.0–1.0)
pH: 6.5 (ref 5.0–8.0)

## 2024-06-21 LAB — COMPREHENSIVE METABOLIC PANEL WITH GFR
ALT: 7 U/L (ref 0–35)
AST: 25 U/L (ref 0–37)
Albumin: 4.2 g/dL (ref 3.5–5.2)
Alkaline Phosphatase: 43 U/L (ref 39–117)
BUN: 25 mg/dL — ABNORMAL HIGH (ref 6–23)
CO2: 29 meq/L (ref 19–32)
Calcium: 9 mg/dL (ref 8.4–10.5)
Chloride: 96 meq/L (ref 96–112)
Creatinine, Ser: 0.65 mg/dL (ref 0.40–1.20)
GFR: 77.5 mL/min (ref >60.00)
Glucose, Bld: 103 mg/dL — ABNORMAL HIGH (ref 70–99)
Potassium: 4.1 meq/L (ref 3.5–5.1)
Sodium: 136 meq/L (ref 135–145)
Total Bilirubin: 0.5 mg/dL (ref 0.2–1.2)
Total Protein: 5.6 g/dL — ABNORMAL LOW (ref 6.0–8.3)

## 2024-06-21 LAB — URINE CULTURE
MICRO NUMBER:: 16794961
Result:: NO GROWTH
SPECIMEN QUALITY:: ADEQUATE

## 2024-06-21 LAB — HEMOGLOBIN A1C: Hgb A1c MFr Bld: 5.5 % (ref 4.6–6.5)

## 2024-06-21 LAB — TSH: TSH: 1.24 u[IU]/mL (ref 0.35–5.50)

## 2024-06-21 LAB — VITAMIN D 25 HYDROXY (VIT D DEFICIENCY, FRACTURES): VITD: 60.33 ng/mL (ref 30.00–100.00)

## 2024-06-26 NOTE — Assessment & Plan Note (Addendum)
 Follows w/ Rheumatology, Dr Lavell. She was recently switched to Cosentyx infusion; Golimab was d/c by Rheumatology. Patient reports no adverse side effects following her first infusion.  Reports noticeable improvement, with aches and pain.

## 2024-06-28 NOTE — Therapy (Signed)
 OUTPATIENT PHYSICAL THERAPY PARKINSON'S DISEASE SCREEN   Patient Name: Stephanie [name] MRN: [id number] DOB:January 13, 1932, 89 y.o., female Today's Date: 07/02/2024   PT End of Session - 07/02/24 1355     Visit Number 1    PT Start Time 1355    PT Stop Time 1417    PT Time Calculation (min) 22 min    Activity Tolerance Patient tolerated treatment well    Behavior During Therapy [facility] for tasks assessed/performed          Past Medical History:  Diagnosis Date   Allergy    Anemia    past hx    Arthropathy of cervical spine 11/27/2012   Right  Xray at [facility]  On 04/08/2016  AP, lateral, and lateral flexion and extension views of the cervical spine are submitted for evaluation.   No acute fracture identified in the cervical spine.   There is redemonstration of approximately 2 mm of C3 on C4 anterolisthesis in neutral which slightly increases in flexion relative to neutral and extension. Slight C5 on C6 retrolisthesis does not change i   Benign hypertension without congestive heart failure    Benign mole    right hcets mole, bleeds when dried with towel   Bilateral dry eyes    Bladder cancer (HCC) 11-10-16   CAD (coronary artery disease), native coronary artery 04/2023   Coronary CTA showed a coronary calcium  score to 46 with less than 25% left main, 25 to 49% proximal LAD, less than 25% left circumflex stenosis   Cataract    Cervical spondylosis    Cervicalgia    2 screws in place    Chronic kidney disease    Depression with anxiety November 10, 2016   prior to husbands death    Dyspnea    Gross hematuria    developed after first hip replacment, led to indicental finding of bladder tumor, bleeding now resolved    H/O total shoulder replacement, left    History of kidney stones    1970's   Hyperlipidemia    Left hand weakness    due to reinjury of flxor tendon s/p tendon surgery march 12-2018   Lumbar stenosis    Malignant tumor of urinary bladder (HCC) 07/2016   Pre-stage I   OA  (osteoarthritis)    Parkinson disease [facility]) dx 2012   neurologist-  dr siddiqui at [facility]   Psoriatic arthritis [facility])     Dr Ishmael , [place] rhe[facility]    Rupture of flexor tendon of hand 2020   right   Past Surgical History:  Procedure Laterality Date   BIOPSY MASS LEFT FIRST METACARPAL   06/23/2006   benign   CATARACT EXTRACTION W/ INTRAOCULAR LENS  IMPLANT, BILATERAL  1999 and 2003   CERVICAL FUSION  02/2017   c1-c2 ; reports she has 2 screws 2in long in place done at [facility] ; patient exhibits VERY LIMITED NECK ROM   COLONOSCOPY     COSMETIC SURGERY     CYSTOSCOPY W/ RETROGRADES Bilateral 09/22/2016   Procedure: CYSTOSCOPY WITH RETROGRADE PYELOGRAM;  Surgeon: Ricardo Likens, MD;  Location: [facility];  Service: Urology;  Laterality: Bilateral;   D & C HYSTEROSCOPY W/ RESECTION POLYP  07/11/2000   DILATION AND CURETTAGE OF UTERUS  1960's   EXCISION CYST AND DEBRIDEMENT RIGHT WIRST AND REMOVAL FORGEIGN BODY  01/09/2009   EYE SURGERY     FLEXOR TENDON REPAIR Left 12/12/2018   Procedure: REPAIR/TRANSFER FLEXOR DIGITORUM PROFUNDUS OF LEFT SMALL  FINGER;  Surgeon: Murrell Kuba, MD;  Location: [facility];  Service: Orthopedics;  Laterality: Left;   JOINT REPLACEMENT     LAPAROSCOPY  yrs ago   infertility    METACARPOPHALANGEAL JOINT ARTHRODESIS Right 05/25/1996   REVERSE SHOULDER ARTHROPLASTY Left 10/14/2022   Procedure: REVERSE SHOULDER ARTHROPLASTY;  Surgeon: Melita Drivers, MD;  Location: [facility];  Service: Orthopedics;  Laterality: Left;    SPINE SURGERY     TENDON REPAIR Left 01/15/2019   Hand   TONSILLECTOMY AND ADENOIDECTOMY  child   TOTAL HIP ARTHROPLASTY Left 07/07/2016   Procedure: LEFT TOTAL HIP ARTHROPLASTY ANTERIOR APPROACH;  Surgeon: Dempsey Moan, MD;  Location: [facility];  Service: Orthopedics;  Laterality: Left;   TOTAL HIP ARTHROPLASTY Right 09/28/2017   Procedure: RIGHT TOTAL HIP ARTHROPLASTY ANTERIOR APPROACH;  Surgeon:  Moan Dempsey, MD;  Location: [facility];  Service: Orthopedics;  Laterality: Right;   TOTAL KNEE ARTHROPLASTY Right 07/30/2019   Procedure: TOTAL KNEE ARTHROPLASTY;  Surgeon: Moan Dempsey, MD;  Location: [facility];  Service: Orthopedics;  Laterality: Right;    TRANSURETHRAL RESECTION OF BLADDER TUMOR N/A 09/22/2016   Procedure: TRANSURETHRAL RESECTION OF BLADDER TUMOR (TURBT);  Surgeon: Ricardo Likens, MD;  Location: [facility];  Service: Urology;  Laterality: N/A;   Patient Active Problem List   Diagnosis Date Noted   Urine frequency 06/20/2024   CAD (coronary artery disease), native coronary artery 04/20/2023   S/P reverse total shoulder arthroplasty, left 10/14/2022   Preoperative cardiovascular examination 10/04/2022   SOB (shortness of breath) 08/02/2022   Insomnia 08/02/2022   Hyponatremia 08/01/2022   Shoulder pain, left 09/14/2021   History of COVID-19 01/06/2021   Diarrhea 09/03/2020   Vitamin D  deficiency 09/03/2020   OA (osteoarthritis) of knee 07/30/2019   Tinea corporis 06/08/2019   Psoriatic arthritis (HCC) 04/02/2019   Anemia 03/13/2018   Hot flashes 03/13/2018   S/P cervical spinal fusion 03/31/2017   Neck pain 12/12/2016   Depression with anxiety 10/17/2016   Bladder cancer (HCC) 10/17/2016   OA (osteoarthritis) of hip 07/07/2016   Spinal stenosis of lumbar region with radiculopathy 04/08/2016   Spondylolisthesis of lumbar region 04/08/2016   Spondylosis of cervical region without myelopathy or radiculopathy 04/08/2016   Preventative health care 01/18/2016   Low back pain 01/18/2016   History of chicken pox    Hyperlipidemia 12/15/2014   Allergic rhinitis 06/25/2014   Allergy to bee sting 06/10/2014   TMJ tenderness 11/16/2013   Other malaise and fatigue 05/07/2013   Arthropathy of cervical spine (HCC) 11/27/2012   Muscle spasms of neck 11/27/2012   Parkinson's disease (HCC) 05/24/2012   Epiretinal membrane 03/09/2012   Hyperopia with  astigmatism and presbyopia 03/09/2012   Pseudophakia 03/09/2012   HTN (hypertension) 08/31/2011   Right knee pain 08/31/2011   Benign hypertensive heart disease without heart failure 05/12/2011    PCP: Domenica Harlene LABOR, MD  REFERRING PROVIDER: Rosalia Hollering, MD   REFERRING DIAG: G20.A1 Parkinson's disease w/o dyskinesia or fluctuating manifestations   RATIONALE FOR EVALUATION AND TREATMENT: Rehabilitation  THERAPY DIAG:  Parkinson's disease, unspecified whether dyskinesia present, unspecified whether manifestations fluctuate (HCC)  ONSET DATE: Diagnosed with PD in ~2012   MOST RECENT PT EPISODE FOR PD: 10/18/2023 - 01/09/2024   SUBJECTIVE:  SUBJECTIVE STATEMENT: Fatim has been going to [place] by [employer] with personal trainer 2x/wk as well as a PD exercise group 1x/wk. Pt reports she has been having problems with her L thumb which prevents her from using the weight machines to build up her strength.  She had a splint made but it cracked and she is going back to have it remade.  If it doesn't help, she may have a injection.  She has not had any therapy for her hand.  Her Botox injections have really been helping with her neck pain.  No concerns with mobility and no falls.  Pt accompanied by: self  PAIN:  Are you having pain? Yes: NPRS scale: 2/10 currently, up to 7-8/10 with use of L hand  Pain location: base of L thumb & radial side of wrist  Pain description: sore  Aggravating factors: lifting or grasping with L hand  Relieving factors: meloxicam    PERTINENT HISTORY:  Chronic neck pain, L shoulder advanced OA s/p reverse TSA on 10/14/22; R TKA 07/2019, B THR 2018 & 2017, posterior C1-2 cervical fusion 2018, rupture of flexor tendon of L hand with failed surgical repair 2020, Parkinson's, psoriatic  arthritis, bladder cancer, HTN    PRECAUTIONS: None  WEIGHT BEARING RESTRICTIONS: No  FALLS:  Has patient fallen in last 6 months? 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.   OBJECTIVE:   PHYSICAL THERAPY PARKINSON'S DISEASE SCREEN  TUG - Timed Up and Go test:  Normal = 8.03 sec Manual = 7.94 sec Cognitive = 9.07 sec Baseline as of D/C from last PT episode:  Normal = 7.60 sec Manual = 8.56 sec Cognitive = 9.28 sec  10 meter walk test: 9.16 sec, Gait speed = 3.58 ft/sec Baseline as of D/C from last PT episode: 7.90 sec, Gait speed = 4.15 ft/sec   5 time sit to stand test: 11.37 sec Baseline as of D/C from last PT episode: 10.97 sec   PATIENT EDUCATION:  Education details: PT eval findings, standardized testing results and interpretation, no need for skilled PT at this time, and plan to repeat PD screen again in another 6 months Person educated: Patient Education method: Explanation Education comprehension: verbalized understanding   ASSESSMENT / PLAN:  CLINICAL IMPRESSION: Stephanie [name] is a 89 y.o. female who was seen today for a 6 month physical therapy screen for Parkinson's disease.  Majority of standardized balance tests w/o significant change from status at discharge from last PT episode, with exception of gait speed slightly decreased from 4.15 ft/sec to 3.58 ft/sec but still within normal gait speed for a community ambulator.  PD Screen Recommendations: Patient does not require Physical Therapy services at this time.  Recommend Physical Therapy screen in 6 months      Elijah CHRISTELLA Hidden, PT 07/02/2024, 2:23 PM

## 2024-07-02 ENCOUNTER — Ambulatory Visit: Attending: Neurology | Admitting: Physical Therapy

## 2024-07-02 DIAGNOSIS — M79642 Pain in left hand: Secondary | ICD-10-CM | POA: Diagnosis not present

## 2024-07-02 DIAGNOSIS — G20A1 Parkinson's disease without dyskinesia, without mention of fluctuations: Secondary | ICD-10-CM | POA: Insufficient documentation

## 2024-07-02 DIAGNOSIS — M25642 Stiffness of left hand, not elsewhere classified: Secondary | ICD-10-CM | POA: Diagnosis not present

## 2024-07-13 ENCOUNTER — Other Ambulatory Visit: Payer: Self-pay | Admitting: Cardiology

## 2024-07-18 ENCOUNTER — Ambulatory Visit: Admitting: Podiatry

## 2024-07-23 ENCOUNTER — Encounter: Payer: Self-pay | Admitting: Family Medicine

## 2024-07-24 DIAGNOSIS — L4050 Arthropathic psoriasis, unspecified: Secondary | ICD-10-CM | POA: Diagnosis not present

## 2024-07-25 ENCOUNTER — Other Ambulatory Visit: Payer: Self-pay | Admitting: Family Medicine

## 2024-08-02 DIAGNOSIS — Z23 Encounter for immunization: Secondary | ICD-10-CM | POA: Diagnosis not present

## 2024-08-03 ENCOUNTER — Encounter: Payer: Self-pay | Admitting: Podiatry

## 2024-08-03 ENCOUNTER — Ambulatory Visit: Admitting: Podiatry

## 2024-08-03 DIAGNOSIS — M79671 Pain in right foot: Secondary | ICD-10-CM

## 2024-08-03 DIAGNOSIS — B351 Tinea unguium: Secondary | ICD-10-CM | POA: Diagnosis not present

## 2024-08-03 DIAGNOSIS — M79672 Pain in left foot: Secondary | ICD-10-CM

## 2024-08-03 NOTE — Progress Notes (Signed)
 Patient presents for evaluation and treatment of tenderness and some redness around nails feet.  Tenderness around toes with walking and wearing shoes.  Physical exam:  General appearance: Alert, pleasant, and in no acute distress.  Vascular: Pedal pulses: DP 2/4 B/L, PT 1/4 B/L. mild edema lower legs bilaterally  Neu  Dermatologic:  Nails thickened, disfigured, discolored 1-5 BL with subungual debris.  Redness and hypertrophic nail folds along nail folds bilaterally but no signs of drainage or infection.  Musculoskeletal:     Diagnosis: 1. Painful onychomycotic nails 1 through 5 bilaterally. 2. Pain toes 1 through 5 bilaterally.  Plan: -Debrided onychomycotic nails 1 through 5 bilaterally.  Sharply debrided nails with nail clipper and reduced with a power bur.  Return 3 months RFC

## 2024-08-15 DIAGNOSIS — G243 Spasmodic torticollis: Secondary | ICD-10-CM | POA: Diagnosis not present

## 2024-08-15 DIAGNOSIS — G2589 Other specified extrapyramidal and movement disorders: Secondary | ICD-10-CM | POA: Diagnosis not present

## 2024-08-21 DIAGNOSIS — L4050 Arthropathic psoriasis, unspecified: Secondary | ICD-10-CM | POA: Diagnosis not present

## 2024-08-27 DIAGNOSIS — Z6821 Body mass index (BMI) 21.0-21.9, adult: Secondary | ICD-10-CM | POA: Diagnosis not present

## 2024-08-27 DIAGNOSIS — M154 Erosive (osteo)arthritis: Secondary | ICD-10-CM | POA: Diagnosis not present

## 2024-08-27 DIAGNOSIS — Z79899 Other long term (current) drug therapy: Secondary | ICD-10-CM | POA: Diagnosis not present

## 2024-08-27 DIAGNOSIS — L409 Psoriasis, unspecified: Secondary | ICD-10-CM | POA: Diagnosis not present

## 2024-08-27 DIAGNOSIS — L4050 Arthropathic psoriasis, unspecified: Secondary | ICD-10-CM | POA: Diagnosis not present

## 2024-08-30 ENCOUNTER — Encounter: Payer: Self-pay | Admitting: Family Medicine

## 2024-08-30 NOTE — Telephone Encounter (Signed)
 Requesting: diazepam  5mg   Contract:02/28/23 UDS: 02/28/23 Last Visit: 06/20/24 w/ Wheeler Next Visit: 10/24/24 Last Refill: 03/12/24 #30 and 2RF   Please Advise

## 2024-08-31 ENCOUNTER — Other Ambulatory Visit: Payer: Self-pay | Admitting: Family

## 2024-08-31 MED ORDER — EZETIMIBE 10 MG PO TABS: 10.0000 mg | ORAL_TABLET | Freq: Every day | ORAL | 2 refills | Status: AC

## 2024-08-31 MED ORDER — DIAZEPAM 5 MG PO TABS: 2.5000 mg | ORAL_TABLET | Freq: Two times a day (BID) | ORAL | 2 refills | Status: DC | PRN

## 2024-09-04 DIAGNOSIS — R300 Dysuria: Secondary | ICD-10-CM | POA: Diagnosis not present

## 2024-09-04 DIAGNOSIS — R350 Frequency of micturition: Secondary | ICD-10-CM | POA: Diagnosis not present

## 2024-09-04 DIAGNOSIS — C679 Malignant neoplasm of bladder, unspecified: Secondary | ICD-10-CM | POA: Diagnosis not present

## 2024-09-11 DIAGNOSIS — C679 Malignant neoplasm of bladder, unspecified: Secondary | ICD-10-CM | POA: Diagnosis not present

## 2024-09-12 LAB — BUN+CREAT: EGFR: 74

## 2024-09-18 DIAGNOSIS — Z111 Encounter for screening for respiratory tuberculosis: Secondary | ICD-10-CM | POA: Diagnosis not present

## 2024-09-18 DIAGNOSIS — L4050 Arthropathic psoriasis, unspecified: Secondary | ICD-10-CM | POA: Diagnosis not present

## 2024-09-18 DIAGNOSIS — R5383 Other fatigue: Secondary | ICD-10-CM | POA: Diagnosis not present

## 2024-09-21 DIAGNOSIS — C679 Malignant neoplasm of bladder, unspecified: Secondary | ICD-10-CM | POA: Diagnosis not present

## 2024-09-21 DIAGNOSIS — N1330 Unspecified hydronephrosis: Secondary | ICD-10-CM | POA: Diagnosis not present

## 2024-09-21 DIAGNOSIS — N134 Hydroureter: Secondary | ICD-10-CM | POA: Diagnosis not present

## 2024-09-27 DIAGNOSIS — N3941 Urge incontinence: Secondary | ICD-10-CM | POA: Diagnosis not present

## 2024-09-27 DIAGNOSIS — R350 Frequency of micturition: Secondary | ICD-10-CM | POA: Diagnosis not present

## 2024-09-27 DIAGNOSIS — R351 Nocturia: Secondary | ICD-10-CM | POA: Diagnosis not present

## 2024-09-27 DIAGNOSIS — N3281 Overactive bladder: Secondary | ICD-10-CM | POA: Diagnosis not present

## 2024-10-03 DIAGNOSIS — G20A1 Parkinson's disease without dyskinesia, without mention of fluctuations: Secondary | ICD-10-CM | POA: Diagnosis not present

## 2024-10-16 DIAGNOSIS — L4050 Arthropathic psoriasis, unspecified: Secondary | ICD-10-CM | POA: Diagnosis not present

## 2024-10-22 NOTE — Assessment & Plan Note (Signed)
 Well controlled, no changes to meds. Encouraged heart healthy diet such as the DASH diet and exercise as tolerated.

## 2024-10-22 NOTE — Assessment & Plan Note (Signed)
 Follows w/ Rheumatology, Dr Lavell.

## 2024-10-22 NOTE — Assessment & Plan Note (Signed)
 Stable. Asymptomatic.  Increase leafy greens, consider increased lean red meat and using cast iron cookware. Continue to monitor, report any concerns

## 2024-10-22 NOTE — Assessment & Plan Note (Signed)
 Stable on current medications

## 2024-10-22 NOTE — Progress Notes (Unsigned)
 Subjective:     Patient ID: Stephanie [name], female    DOB: 1932/10/24, 89 y.o.   MRN: [id number]  No chief complaint on file.   HPI  Discussed the use of AI scribe software for clinical note transcription with the patient, who gave verbal consent to proceed.  History of Present Illness         HCM Immunizations: Covid 19 and Tdap due  Patient denies fever, chills, SOB, CP, palpitations, dyspnea, edema, HA, vision changes, N/V/D, abdominal pain, urinary symptoms, rash, weight changes, and recent illness or hospitalizations.    Health Maintenance Due  Topic Date Due   DTaP/Tdap/Td (2 - Tdap) 02/26/2024   COVID-19 Vaccine (8 - 2025-26 season) 09/27/2024    Past Medical History:  Diagnosis Date   Allergy    Anemia    past hx    Arthropathy of cervical spine 11/27/2012   Right  Xray at [facility]  On 04/08/2016  AP, lateral, and lateral flexion and extension views of the cervical spine are submitted for evaluation.   No acute fracture identified in the cervical spine.   There is redemonstration of approximately 2 mm of C3 on C4 anterolisthesis in neutral which slightly increases in flexion relative to neutral and extension. Slight C5 on C6 retrolisthesis does not change i   Benign hypertension without congestive heart failure    Benign mole    right hcets mole, bleeds when dried with towel   Bilateral dry eyes    Bladder cancer (HCC) 07-Nov-2016   CAD (coronary artery disease), native coronary artery 04/2023   Coronary CTA showed a coronary calcium  score to 46 with less than 25% left main, 25 to 49% proximal LAD, less than 25% left circumflex stenosis   Cataract    Cervical spondylosis    Cervicalgia    2 screws in place    Chronic kidney disease    Depression with anxiety 11-07-16   prior to husbands death    Dyspnea    Gross hematuria    developed after first hip replacment, led to indicental finding of bladder tumor, bleeding now resolved    H/O total shoulder  replacement, left    History of kidney stones    1970's   Hyperlipidemia    Left hand weakness    due to reinjury of flxor tendon s/p tendon surgery march 12-2018   Lumbar stenosis    Malignant tumor of urinary bladder (HCC) 07/2016   Pre-stage I   OA (osteoarthritis)    Parkinson disease [facility]) dx 2012   neurologist-  dr siddiqui at [facility]   Psoriatic arthritis [facility])     Dr Ishmael , [place] [facility]    Rupture of flexor tendon of hand 2020   right    Past Surgical History:  Procedure Laterality Date   BIOPSY MASS LEFT FIRST METACARPAL   06/23/2006   benign   CATARACT EXTRACTION W/ INTRAOCULAR LENS  IMPLANT, BILATERAL  1999 and 2003   CERVICAL FUSION  02/2017   c1-c2 ; reports she has 2 screws 2in long in place done at [facility] ; patient exhibits VERY LIMITED NECK ROM   COLONOSCOPY     COSMETIC SURGERY     CYSTOSCOPY W/ RETROGRADES Bilateral 09/22/2016   Procedure: CYSTOSCOPY WITH RETROGRADE PYELOGRAM;  Surgeon: Ricardo Likens, MD;  Location: [facility];  Service: Urology;  Laterality: Bilateral;   D & C HYSTEROSCOPY W/ RESECTION POLYP  07/11/2000   DILATION AND CURETTAGE OF  UTERUS  1960's   EXCISION CYST AND DEBRIDEMENT RIGHT WIRST AND REMOVAL FORGEIGN BODY  01/09/2009   EYE SURGERY     FLEXOR TENDON REPAIR Left 12/12/2018   Procedure: REPAIR/TRANSFER FLEXOR DIGITORUM PROFUNDUS OF LEFT SMALL FINGER;  Surgeon: Murrell Kuba, MD;  Location: [facility];  Service: Orthopedics;  Laterality: Left;   JOINT REPLACEMENT     LAPAROSCOPY  yrs ago   infertility    METACARPOPHALANGEAL JOINT ARTHRODESIS Right 05/25/1996   REVERSE SHOULDER ARTHROPLASTY Left 10/14/2022   Procedure: REVERSE SHOULDER ARTHROPLASTY;  Surgeon: Melita Drivers, MD;  Location: [facility];  Service: Orthopedics;  Laterality: Left;    SPINE SURGERY     TENDON REPAIR Left 01/15/2019   Hand   TONSILLECTOMY AND ADENOIDECTOMY  child   TOTAL HIP ARTHROPLASTY Left 07/07/2016    Procedure: LEFT TOTAL HIP ARTHROPLASTY ANTERIOR APPROACH;  Surgeon: Dempsey Moan, MD;  Location: [facility];  Service: Orthopedics;  Laterality: Left;   TOTAL HIP ARTHROPLASTY Right 09/28/2017   Procedure: RIGHT TOTAL HIP ARTHROPLASTY ANTERIOR APPROACH;  Surgeon: Moan Dempsey, MD;  Location: [facility];  Service: Orthopedics;  Laterality: Right;   TOTAL KNEE ARTHROPLASTY Right 07/30/2019   Procedure: TOTAL KNEE ARTHROPLASTY;  Surgeon: Moan Dempsey, MD;  Location: [facility];  Service: Orthopedics;  Laterality: Right;    TRANSURETHRAL RESECTION OF BLADDER TUMOR N/A 09/22/2016   Procedure: TRANSURETHRAL RESECTION OF BLADDER TUMOR (TURBT);  Surgeon: Ricardo Likens, MD;  Location[facility];  Service: Urology;  Laterality: N/A;    Family History  Problem Relation Age of Onset   Arthritis Mother    Hypertension Mother    Deep vein thrombosis Mother        recurrent, secondary to Bleeding disorder   Arthritis Father    Stroke Father    Bladder Cancer Father    Cancer Father    Diabetes Sister    Heart disease Sister    Obesity Sister    Arthritis Sister    Hypertension Sister    Heart disease Maternal Grandmother    Heart disease Maternal Grandfather    Peripheral vascular disease Paternal Grandmother        s/p leg amputation   Stroke Paternal Grandfather    Esophageal cancer Paternal Aunt    Hearing loss Sister    Arthritis Sister    Colon cancer Neg Hx    Colon polyps Neg Hx    Rectal cancer Neg Hx    Stomach cancer Neg Hx     Social History   Socioeconomic History   Marital status: Widowed    Spouse name: Not on file   Number of children: Not on file   Years of education: Not on file   Highest education level: Bachelor's degree (e.g., BA, AB, BS)  Occupational History   Not on file  Tobacco Use   Smoking status: Former    Current packs/day: 0.00    Average packs/day: 0.3 packs/day for 10.0 years (2.5 ttl pk-yrs)    Types: Cigarettes    Start date:  05/04/1954    Quit date: 05/04/1964    Years since quitting: 60.5   Smokeless tobacco: Never   Tobacco comments:    Pt states that when she was smoking 1 pack of cigs would last her 3 days. 10/13/22 ALS   Vaping Use   Vaping status: Never Used  Substance and Sexual Activity   Alcohol use: Yes    Alcohol/week: 1.0 standard drink of alcohol  Types: 1 Glasses of wine per week    Comment: ocassionally- social    Drug use: No   Sexual activity: Not Currently    Partners: Male  Other Topics Concern   Not on file  Social History Narrative   Not on file   Social Drivers of Health   Financial Resource Strain: Low Risk  (10/17/2024)   Overall Financial Resource Strain (CARDIA)    Difficulty of Paying Living Expenses: Not very hard  Food Insecurity: No Food Insecurity (10/17/2024)   Hunger Vital Sign    Worried About Running Out of Food in the Last Year: Never true    Ran Out of Food in the Last Year: Never true  Transportation Needs: No Transportation Needs (10/17/2024)   PRAPARE - Administrator, Civil Service (Medical): No    Lack of Transportation (Non-Medical): No  Physical Activity: Inactive (10/17/2024)   Exercise Vital Sign    Days of Exercise per Week: 0 days    Minutes of Exercise per Session: Not on file  Stress: Stress Concern Present (10/17/2024)   [employer] of Occupational Health - Occupational Stress Questionnaire    Feeling of Stress: To some extent  Social Connections: Moderately Integrated (10/17/2024)   Social Connection and Isolation Panel    Frequency of Communication with Friends and Family: Three times a week    Frequency of Social Gatherings with Friends and Family: Once a week    Attends Religious Services: More than 4 times per year    Active Member of [employer] or Organizations: Yes    Attends Banker Meetings: More than 4 times per year    Marital Status: Widowed  Intimate Partner Violence: Not At Risk (05/08/2024)   Humiliation,  Afraid, Rape, and Kick questionnaire    Fear of Current or Ex-Partner: No    Emotionally Abused: No    Physically Abused: No    Sexually Abused: No    Outpatient Medications Prior to Visit  Medication Sig Dispense Refill   amLODipine  (NORVASC ) 5 MG tablet TAKE 1 TABLET (5 MG TOTAL) BY MOUTH DAILY. 90 tablet 3   Apoaequorin (PREVAGEN PO) Take 1 tablet by mouth in the morning.     aspirin EC 81 MG tablet Take 81 mg by mouth daily. Swallow whole.     Carbidopa -Levodopa  ER (RYTARY ) 48.75-195 MG CPCR Take 2 capsules by mouth 4 (four) times daily.     cholecalciferol (VITAMIN D3) 25 MCG (1000 UNIT) tablet Take 1,000 Units by mouth in the morning.     cholestyramine  (QUESTRAN ) 4 g packet Take 1 packet (4 g total) by mouth 2 (two) times daily as needed. 30 each 2   Coenzyme Q10 (COQ10 PO) Take 1 tablet by mouth in the morning.     diazepam  (VALIUM ) 5 MG tablet Take 0.5 tablets (2.5 mg total) by mouth every 12 (twelve) hours as needed for anxiety. 30 tablet 2   DIGESTIVE ENZYMES PO Take 1 capsule by mouth 3 (three) times daily before meals. Active Enzymes by Vernita Hawks     EPINEPHrine  0.3 mg/0.3 mL IJ SOAJ injection Inject 0.3 mg into the muscle as needed for anaphylaxis.     ezetimibe  (ZETIA ) 10 MG tablet Take 1 tablet (10 mg total) by mouth daily. 90 tablet 2   fluticasone  (CUTIVATE ) 0.05 % cream Apply 1 application topically daily as needed (scalp psoriasis).  1   hydrochlorothiazide  (MICROZIDE ) 12.5 MG capsule Take 1 capsule (12.5 mg total) by mouth daily. 90  capsule 1   melatonin 5 MG TABS Take 5 mg by mouth at bedtime.     meloxicam  (MOBIC ) 15 MG tablet Take 15 mg by mouth daily as needed (joint pain (max 3-4 times/weekly)).     Multiple Vitamin (MULTIVITAMIN WITH MINERALS) TABS tablet Take 1 tablet by mouth in the morning. Centrum Silver     Polyethyl Glycol-Propyl Glycol (SYSTANE) 0.4-0.3 % SOLN Place 1-2 drops into both eyes 3 (three) times daily as needed (dry/irritated eyes.).      Probiotic Product (PROBIOTIC PO) Take 1 capsule by mouth in the morning and at bedtime. Restore Ultimate Probiotic     Secukinumab  (COSENTYX  IV) Inject into the vein.     sertraline  (ZOLOFT ) 25 MG tablet Take 0.5-1 tablets (12.5-25 mg total) by mouth at bedtime.     No facility-administered medications prior to visit.    Allergies  Allergen Reactions   Crestor  [Rosuvastatin ] Other (See Comments)    Myalgias    Cleocin [Clindamycin] Diarrhea   Codeine  Nausea And Vomiting   Hornet Venom    Penicillins Itching and Swelling    Has patient had a PCN reaction causing immediate rash, facial/tongue/throat swelling, SOB or lightheadedness with hypotension: Yes Has patient had a PCN reaction causing severe rash involving mucus membranes or skin necrosis: No Has patient had a PCN reaction that required hospitalization: No Has patient had a PCN reaction occurring within the last 10 years: No If all of the above answers are NO, then may proceed with Cephalosporin use.    Vibramycin  [Doxycycline ] Diarrhea   Yellow Jacket Venom Swelling   Lodine [Etodolac] Rash   Tramadol Itching and Rash    ROS     Objective:    Physical Exam Vitals reviewed.  Constitutional:      General: She is not in acute distress.    Appearance: She is not toxic-appearing.  HENT:     Head: Normocephalic and atraumatic.     Mouth/Throat:     Mouth: Mucous membranes are moist.     Pharynx: Oropharynx is clear.  Eyes:     Pupils: Pupils are equal, round, and reactive to light.  Cardiovascular:     Rate and Rhythm: Normal rate and regular rhythm.     Pulses: Normal pulses.     Heart sounds: Normal heart sounds. No murmur heard. Pulmonary:     Effort: Pulmonary effort is normal. No respiratory distress.     Breath sounds: Normal breath sounds. No wheezing.  Musculoskeletal:        General: No swelling.     Cervical back: Neck supple.  Skin:    General: Skin is warm and dry.  Neurological:     General: No  focal deficit present.     Mental Status: She is alert and oriented to person, place, and time.  Psychiatric:        Mood and Affect: Mood normal.        Behavior: Behavior normal.        Thought Content: Thought content normal.        Judgment: Judgment normal.      LMP 01/13/2013 Comment: spotting-had benign endometrial biopsy  Wt Readings from Last 3 Encounters:  06/20/24 116 lb (52.6 kg)  05/08/24 113 lb (51.3 kg)  04/02/24 113 lb 3.2 oz (51.3 kg)       Assessment & Plan:   Problem List Items Addressed This Visit     Anemia - Primary   Stable. Asymptomatic.  Increase leafy  greens, consider increased lean red meat and using cast iron cookware. Continue to monitor, report any concerns        CAD (coronary artery disease), native coronary artery   Asymptomatic. Follows with Cardiology. On ASA and Zetia       Depression with anxiety   Stable on current medications.      HTN (hypertension)   Well controlled, no changes to meds. Encouraged heart healthy diet such as the DASH diet and exercise as tolerated.        Hyperlipidemia   LDL goal <70 On Zetia . Hx statin intolerance. Encourage heart healthy diet such as MIND or DASH diet, increase exercise, avoid trans fats, simple carbohydrates and processed foods, consider a krill or fish or flaxseed oil cap daily.        Insomnia   Stable on current medications.      Parkinson's disease [facility])   Follows with Neurology.      Psoriatic arthritis (HCC)   Follows w/ Rheumatology, Dr Lavell.       I am having Jesyka K. [facility] maintain her fluticasone , Probiotic Product (PROBIOTIC PO), melatonin, cholecalciferol, meloxicam , multivitamin with minerals, Coenzyme Q10 (COQ10 PO), Apoaequorin (PREVAGEN PO), DIGESTIVE ENZYMES PO, EPINEPHrine , Systane, cholestyramine , Rytary , aspirin EC, amLODipine , sertraline , Secukinumab  (COSENTYX  IV), hydrochlorothiazide , ezetimibe , and diazepam .  No orders of the defined types were placed  in this encounter.

## 2024-10-22 NOTE — Assessment & Plan Note (Signed)
Follows with Neurology 

## 2024-10-22 NOTE — Assessment & Plan Note (Signed)
 Asymptomatic. Follows with Cardiology. On ASA and Zetia 

## 2024-10-22 NOTE — Assessment & Plan Note (Signed)
 LDL goal <70 On Zetia . Hx statin intolerance. Encourage heart healthy diet such as MIND or DASH diet, increase exercise, avoid trans fats, simple carbohydrates and processed foods, consider a krill or fish or flaxseed oil cap daily.

## 2024-10-24 ENCOUNTER — Ambulatory Visit: Admitting: Student

## 2024-10-24 ENCOUNTER — Encounter: Payer: Self-pay | Admitting: Student

## 2024-10-24 VITALS — BP 122/73 | HR 94 | Ht 62.0 in | Wt 118.0 lb

## 2024-10-24 DIAGNOSIS — L4050 Arthropathic psoriasis, unspecified: Secondary | ICD-10-CM

## 2024-10-24 DIAGNOSIS — I10 Essential (primary) hypertension: Secondary | ICD-10-CM

## 2024-10-24 DIAGNOSIS — I2510 Atherosclerotic heart disease of native coronary artery without angina pectoris: Secondary | ICD-10-CM

## 2024-10-24 DIAGNOSIS — R350 Frequency of micturition: Secondary | ICD-10-CM | POA: Diagnosis not present

## 2024-10-24 DIAGNOSIS — E7800 Pure hypercholesterolemia, unspecified: Secondary | ICD-10-CM | POA: Diagnosis not present

## 2024-10-24 DIAGNOSIS — D649 Anemia, unspecified: Secondary | ICD-10-CM | POA: Diagnosis not present

## 2024-10-24 DIAGNOSIS — F418 Other specified anxiety disorders: Secondary | ICD-10-CM | POA: Diagnosis not present

## 2024-10-24 DIAGNOSIS — G4700 Insomnia, unspecified: Secondary | ICD-10-CM | POA: Diagnosis not present

## 2024-10-24 DIAGNOSIS — G20A1 Parkinson's disease without dyskinesia, without mention of fluctuations: Secondary | ICD-10-CM

## 2024-10-24 MED ORDER — DIAZEPAM 5 MG PO TABS: 2.5000 mg | ORAL_TABLET | Freq: Two times a day (BID) | ORAL | 2 refills | Status: AC | PRN

## 2024-10-24 NOTE — Assessment & Plan Note (Signed)
 Following with urology, Dr. Gaston with [facility]. History of bladder tumor removal. Recent cystoscopic evaluation revealed an area of concern, and urology plans to proceed with a biopsy.  Continue follow-up with Urology.

## 2024-10-30 DIAGNOSIS — C678 Malignant neoplasm of overlapping sites of bladder: Secondary | ICD-10-CM | POA: Diagnosis not present

## 2024-10-30 DIAGNOSIS — D49519 Neoplasm of unspecified behavior of unspecified kidney: Secondary | ICD-10-CM | POA: Diagnosis not present

## 2024-10-31 ENCOUNTER — Encounter: Payer: Self-pay | Admitting: Family Medicine

## 2024-11-02 ENCOUNTER — Ambulatory Visit: Admitting: Podiatry

## 2024-11-02 DIAGNOSIS — M79671 Pain in right foot: Secondary | ICD-10-CM

## 2024-11-02 DIAGNOSIS — M79672 Pain in left foot: Secondary | ICD-10-CM | POA: Diagnosis not present

## 2024-11-02 DIAGNOSIS — B351 Tinea unguium: Secondary | ICD-10-CM | POA: Diagnosis not present

## 2024-11-02 NOTE — Progress Notes (Signed)
 Patient presents for evaluation and treatment of tenderness and some redness around nails feet.  Tenderness around toes with walking and wearing shoes.  Physical exam:  General appearance: Alert, pleasant, and in no acute distress.  Vascular: Pedal pulses: DP 2/4 B/L, PT 1/4 B/L. Mild edema lower legs bilaterally.  Capillary refill time immediate bilaterally  Neurologic:  Dermatologic:  Nails thickened, disfigured, discolored 1-5 BL with subungual debris.  Redness and hypertrophic nail folds along nail folds bilaterally but no signs of drainage or infection.  Musculoskeletal:     Diagnosis: 1. Painful onychomycotic nails 1 through 5 bilaterally. 2. Pain toes 1 through 5 bilaterally.  Plan: -Debrided onychomycotic nails 1 through 5 bilaterally.  Sharply debrided nails with nail clipper and reduced with a power bur.  Return 3 months RFC

## 2024-11-06 ENCOUNTER — Other Ambulatory Visit: Payer: Self-pay | Admitting: Urology

## 2024-11-06 NOTE — Telephone Encounter (Signed)
 She herself requested the change since Rytary  was not available. She should verify that Rytary  is now available and I can make the switch again

## 2024-11-16 NOTE — Patient Instructions (Addendum)
 SURGICAL WAITING ROOM VISITATION Patients having surgery or a procedure may have no more than 2 support people in the waiting area - these visitors may rotate.    Children under the age of 50 will not be allowed to visit due to the increase in respiratory illness  Children under the age of 77 must have an adult with them who is not the patient.  If the patient needs to stay at the hospital during part of their recovery, the visitor guidelines for inpatient rooms apply. Pre-op nurse will coordinate an appropriate time for 1 support person to accompany patient in pre-op.  This support person may not rotate.    Please refer to the [facility] website for the visitor guidelines for Inpatients (after your surgery is over and you are in a regular room).       Your procedure is scheduled on: 11-30-24   Report to [facility] Main Entrance    Report to admitting at 9:00 AM   Call this number if you have problems the morning of surgery [phone]   Do not eat food or drink liquids :After Midnight.           If you have questions, please contact your surgeons office.   FOLLOW  ANY ADDITIONAL PRE OP INSTRUCTIONS YOU RECEIVED FROM YOUR SURGEON'S OFFICE!!!     Oral Hygiene is also important to reduce your risk of infection.                                    Remember - BRUSH YOUR TEETH THE MORNING OF SURGERY WITH YOUR REGULAR TOOTHPASTE   Do NOT smoke after Midnight   Take these medicines the morning of surgery with A SIP OF WATER :    Amlodipine    Carbidopa -Levodopa    Ezetimbe   If needed Diazepam   Stop all vitamins and herbal supplements 7 days before surgery                              You may not have any metal on your body including hair pins, jewelry, and body piercing             Do not wear make-up, lotions, powders, perfumes or deodorant  Do not wear nail polish including gel and S&S, artificial/acrylic nails, or any other type of covering on natural nails  including finger and toenails. If you have artificial nails, gel coating, etc. that needs to be removed by a nail salon please have this removed prior to surgery or surgery may need to be canceled/ delayed if the surgeon/ anesthesia feels like they are unable to be safely monitored.   Do not shave  48 hours prior to surgery.    Do not bring valuables to the hospital. [facility] IS NOT RESPONSIBLE   FOR VALUABLES.   Contacts, dentures or bridgework may not be worn into surgery.   DO NOT BRING YOUR HOME MEDICATIONS TO THE HOSPITAL. PHARMACY WILL DISPENSE MEDICATIONS LISTED ON YOUR MEDICATION LIST TO YOU DURING YOUR ADMISSION IN THE HOSPITAL!    Patients discharged on the day of surgery will not be allowed to drive home.  Someone NEEDS to stay with you for the first 24 hours after anesthesia.               Please read over the following fact sheets you were given:  IF YOU HAVE QUESTIONS ABOUT YOUR PRE-OP INSTRUCTIONS PLEASE CALL [phone] Gwen  If you received a COVID test during your pre-op visit  it is requested that you wear a mask when out in public, stay away from anyone that may not be feeling well and notify your surgeon if you develop symptoms. If you test positive for Covid or have been in contact with anyone that has tested positive in the last 10 days please notify you surgeon.  [facility] - Preparing for Surgery Before surgery, you can play an important role.  Because skin is not sterile, your skin needs to be as free of germs as possible.  You can reduce the number of germs on your skin by washing with CHG (chlorahexidine gluconate) soap before surgery.  CHG is an antiseptic cleaner which kills germs and bonds with the skin to continue killing germs even after washing. Please DO NOT use if you have an allergy to CHG or antibacterial soaps.  If your skin becomes reddened/irritated stop using the CHG and inform your nurse when you arrive at Short Stay. Do not shave (including legs and  underarms) for at least 48 hours prior to the first CHG shower.  You may shave your face/neck.  Please follow these instructions carefully:  1.  Shower with CHG Soap the night before surgery and the  morning of surgery.  2.  If you choose to wash your hair, wash your hair first as usual with your normal  shampoo.  3.  After you shampoo, rinse your hair and body thoroughly to remove the shampoo.                             4.  Use CHG as you would any other liquid soap.  You can apply chg directly to the skin and wash.  Gently with a scrungie or clean washcloth.  5.  Apply the CHG Soap to your body ONLY FROM THE NECK DOWN.   Do   not use on face/ open                           Wound or open sores. Avoid contact with eyes, ears mouth and   genitals (private parts).                       Wash face,  Genitals (private parts) with your normal soap.             6.  Wash thoroughly, paying special attention to the area where your    surgery  will be performed.  7.  Thoroughly rinse your body with warm water  from the neck down.  8.  DO NOT shower/wash with your normal soap after using and rinsing off the CHG Soap.                9.  Pat yourself dry with a clean towel.            10.  Wear clean pajamas.            11.  Place clean sheets on your bed the night of your first shower and do not  sleep with pets. Day of Surgery : Do not apply any lotions/deodorants the morning of surgery.  Please wear clean clothes to the hospital/surgery center.  FAILURE TO FOLLOW THESE INSTRUCTIONS MAY RESULT IN THE CANCELLATION OF YOUR  SURGERY  PATIENT SIGNATURE_________________________________  NURSE SIGNATURE__________________________________  ________________________________________________________________________

## 2024-11-16 NOTE — Progress Notes (Addendum)
 Date of COVID positive in last 90 days:  No  PCP - Harlene Horton, MD Cardiologist - Wilbert Bihari, MD Neurologist - Toya Hatchet, MD Pulmonologist - Donnice Beals, MD  Chest x-ray - N/A EKG - 04-02-24  Epic Stress Test - 08-27-22 Epic ECHO - 04-29-23 Epic Cardiac Cath - N/A Coronary CT - 04-19-23 Epic Pacemaker/ICD device last checked:N/A Spinal Cord Stimulator:N/A  Bowel Prep - N/A  Sleep Study - N/A CPAP -   Fasting Blood Sugar - N/A Checks Blood Sugar _____ times a day  Last dose of GLP1 agonist-  N/A GLP1 instructions:  Do not take after     Last dose of SGLT-2 inhibitors-  N/A SGLT-2 instructions:  Do not take after    Blood Thinner Instructions: N/A Last dose:   Time: Aspirin Instructions:  ASA 81 ,per patient to hold x5 days Last Dose:  Activity level:  Can go up a flight of stairs and perform activities of daily living without stopping and without symptoms of chest pain.  Patient states that she has  shortness of breath with exertion but does not have to stop and rest.    States that this has not worsened.   Patient lives alone.    Anesthesia review: Parkinson's, HTN, AS CAD, shortness of breath  Sodium 129 on preop labs  Patient denies shortness of breath, fever, cough and chest pain at PAT appointment  Patient verbalized understanding of instructions that were given to them at the PAT appointment. Patient was also instructed that they will need to review over the PAT instructions again at home before surgery.

## 2024-11-16 NOTE — Progress Notes (Signed)
 Sent message, via epic in basket, requesting orders in epic from Careers adviser.

## 2024-11-19 ENCOUNTER — Encounter: Payer: Self-pay | Admitting: Family Medicine

## 2024-11-19 ENCOUNTER — Telehealth: Payer: Self-pay | Admitting: Cardiology

## 2024-11-19 NOTE — Telephone Encounter (Signed)
 PT has a surgery scheduled for 11/30/24, PT is worried about her heart valve because both her sister and mother passed away during surgery because of their heart valve. PT wants Dr. Shlomo to call her stating she is okay for surgery. Please advise.

## 2024-11-19 NOTE — Telephone Encounter (Signed)
 Pt calling in regards to her TRANSURETHRAL RESECTION OF BLADDER TUMOR surgery 11/30/24. Pt wanting to make Cardiologist aware of surgery and stated her mother had hip replacement surgery and died because her valve was bad. Pt also stating her sister had knee replacement recently and died because her heart was bad. Pt asking for advisement from Dr. Shlomo and wanting to make sure she is okay for surgery. Will send to Dr Shlomo. Pt verbalizes understanding of plan.   Primary phone number updated in chart, as patient states she can hear better on this line.

## 2024-11-20 ENCOUNTER — Encounter (HOSPITAL_COMMUNITY)
Admission: RE | Admit: 2024-11-20 | Discharge: 2024-11-20 | Disposition: A | Discharge status: Home / Self Care | Source: Ambulatory Visit | Attending: Urology | Admitting: Urology

## 2024-11-20 ENCOUNTER — Telehealth (HOSPITAL_BASED_OUTPATIENT_CLINIC_OR_DEPARTMENT_OTHER): Payer: Self-pay | Admitting: *Deleted

## 2024-11-20 ENCOUNTER — Other Ambulatory Visit: Payer: Self-pay

## 2024-11-20 ENCOUNTER — Encounter (HOSPITAL_COMMUNITY): Payer: Self-pay

## 2024-11-20 VITALS — BP 126/72 | HR 89 | Temp 98.3°F | Resp 12 | Ht 62.0 in | Wt 117.0 lb

## 2024-11-20 DIAGNOSIS — I10 Essential (primary) hypertension: Secondary | ICD-10-CM | POA: Diagnosis not present

## 2024-11-20 DIAGNOSIS — D649 Anemia, unspecified: Secondary | ICD-10-CM | POA: Insufficient documentation

## 2024-11-20 DIAGNOSIS — Z01812 Encounter for preprocedural laboratory examination: Secondary | ICD-10-CM | POA: Insufficient documentation

## 2024-11-20 DIAGNOSIS — Z01818 Encounter for other preprocedural examination: Secondary | ICD-10-CM | POA: Diagnosis present

## 2024-11-20 LAB — CBC
HCT: 37.7 % (ref 36.0–46.0)
Hemoglobin: 12.7 g/dL (ref 12.0–15.0)
MCH: 30.7 pg (ref 26.0–34.0)
MCHC: 33.7 g/dL (ref 30.0–36.0)
MCV: 91.1 fL (ref 80.0–100.0)
Platelets: 166 K/uL (ref 150–400)
RBC: 4.14 MIL/uL (ref 3.87–5.11)
RDW: 13.1 % (ref 11.5–15.5)
WBC: 6.9 K/uL (ref 4.0–10.5)
nRBC: 0 % (ref 0.0–0.2)

## 2024-11-20 LAB — BASIC METABOLIC PANEL WITH GFR
Anion gap: 9 (ref 5–15)
BUN: 24 mg/dL — ABNORMAL HIGH (ref 8–23)
CO2: 25 mmol/L (ref 22–32)
Calcium: 9.2 mg/dL (ref 8.9–10.3)
Chloride: 95 mmol/L — ABNORMAL LOW (ref 98–111)
Creatinine, Ser: 0.75 mg/dL (ref 0.44–1.00)
GFR, Estimated: >60 mL/min
Glucose, Bld: 108 mg/dL — ABNORMAL HIGH (ref 70–99)
Potassium: 4.2 mmol/L (ref 3.5–5.1)
Sodium: 129 mmol/L — ABNORMAL LOW (ref 135–145)

## 2024-11-20 NOTE — Telephone Encounter (Signed)
 I will reach out to Dr. Donnice Peers office in regard to a cardiac preop clearance for the pt. I have reviewed the chart and I do not see a clearance request that we received. I s/w Heather with Dr. Peers office and was able to confirm the procedure in Epic is the procedure to be done. I will enter the clearance into the chart in our preop format and send to preop to review with Dr. Shlomo.       Pre-operative Risk Assessment    Patient Name: Stephanie [name]  DOB: [birth year] MRN: [id number]   Date of last office visit: 04/02/24 DR. TURNER Date of next office visit: NONE   Request for Surgical Clearance    Procedure:  TURBT   Date of Surgery:  Clearance 11/30/24                                Surgeon:  DR. MATTHEW GAY Surgeon's Group or Practice Name:  [facility] Phone number:  [phone] Fax number:  [phone]   Type of Clearance Requested:   - Medical  - Pharmacy:  Hold Aspirin     Type of Anesthesia:  General    Additional requests/questions:    Bonney Niels Jest   11/20/2024, 8:31 AM

## 2024-11-20 NOTE — Telephone Encounter (Signed)
" ° °  Name: BESSYE STITH  DOB: 1932/02/27  MRN: [id number]  Primary Cardiologist: Wilbert Bihari, MD Last OV 03/2024  Preoperative team, please contact this patient and set up a phone call appointment for further preoperative risk assessment. Please obtain consent and complete medication review. Thank you for your help.  I confirm that guidance regarding antiplatelet and oral anticoagulation therapy has been completed and, if necessary, noted below.  Regarding ASA therapy, we recommend continuation of ASA throughout the perioperative period.  However, if the surgeon feels that cessation of ASA is required in the perioperative period, it may be stopped 5-7 days prior to surgery with a plan to resume it as soon as felt to be feasible from a surgical standpoint in the post-operative period.   I also confirmed the patient resides in the state of [place] . As per [facility] Medical Board telemedicine laws, the patient must reside in the state in which the provider is licensed.  Clevon Khader D Joanthan Hlavacek, NP 11/20/2024, 8:51 AM [facility]    "

## 2024-11-20 NOTE — Telephone Encounter (Signed)
 S/w the pt and she has been scheduled tele preop appt 11/22/24. Med rec and consent are done.

## 2024-11-20 NOTE — Telephone Encounter (Signed)
 S/w the pt and she has been scheduled tele preop appt 11/22/24. Med rec and consent are done.      Patient Consent for Virtual Visit        Stephanie [name] has provided verbal consent on 11/20/2024 for a virtual visit (video or telephone).   CONSENT FOR VIRTUAL VISIT FOR:  Stephanie [name]  By participating in this virtual visit I agree to the following:  I hereby voluntarily request, consent and authorize [facility] and its employed or contracted physicians, physician assistants, nurse practitioners or other licensed health care professionals (the Practitioner), to provide me with telemedicine health care services (the Services) as deemed necessary by the treating Practitioner. I acknowledge and consent to receive the Services by the Practitioner via telemedicine. I understand that the telemedicine visit will involve communicating with the Practitioner through live audiovisual communication technology and the disclosure of certain medical information by electronic transmission. I acknowledge that I have been given the opportunity to request an in-person assessment or other available alternative prior to the telemedicine visit and am voluntarily participating in the telemedicine visit.  I understand that I have the right to withhold or withdraw my consent to the use of telemedicine in the course of my care at any time, without affecting my right to future care or treatment, and that the Practitioner or I may terminate the telemedicine visit at any time. I understand that I have the right to inspect all information obtained and/or recorded in the course of the telemedicine visit and may receive copies of available information for a reasonable fee.  I understand that some of the potential risks of receiving the Services via telemedicine include:  Delay or interruption in medical evaluation due to technological equipment failure or disruption; Information transmitted may not be sufficient  (e.g. poor resolution of images) to allow for appropriate medical decision making by the Practitioner; and/or  In rare instances, security protocols could fail, causing a breach of personal health information.  Furthermore, I acknowledge that it is my responsibility to provide information about my medical history, conditions and care that is complete and accurate to the best of my ability. I acknowledge that Practitioner's advice, recommendations, and/or decision may be based on factors not within their control, such as incomplete or inaccurate data provided by me or distortions of diagnostic images or specimens that may result from electronic transmissions. I understand that the practice of medicine is not an exact science and that Practitioner makes no warranties or guarantees regarding treatment outcomes. I acknowledge that a copy of this consent can be made available to me via my patient portal [facility] MyChart), or I can request a printed copy by calling the office of [facility].    I understand that my insurance will be billed for this visit.   I have read or had this consent read to me. I understand the contents of this consent, which adequately explains the benefits and risks of the Services being provided via telemedicine.  I have been provided ample opportunity to ask questions regarding this consent and the Services and have had my questions answered to my satisfaction. I give my informed consent for the services to be provided through the use of telemedicine in my medical care

## 2024-11-22 ENCOUNTER — Ambulatory Visit: Attending: Student in an Organized Health Care Education/Training Program | Admitting: Nurse Practitioner

## 2024-11-22 DIAGNOSIS — Z01810 Encounter for preprocedural cardiovascular examination: Secondary | ICD-10-CM | POA: Insufficient documentation

## 2024-11-22 NOTE — Progress Notes (Signed)
 "   Virtual Visit via Telephone Note   Because of [name] co-morbid illnesses, she is at least at moderate risk for complications without adequate follow up.  This format is felt to be most appropriate for this patient at this time.  Due to technical limitations with video connection (technology), today's appointment will be conducted as an audio only telehealth visit, and [name] verbally agreed to proceed in this manner.   All issues noted in this document were discussed and addressed.  No physical exam could be performed with this format.  Evaluation Performed:  Preoperative cardiovascular risk assessment _____________   Date:  11/22/2024   Patient ID:  Stephanie [name], DOB 1932-05-24, MRN [id number] Patient Location:  Home Provider location:   Office  Primary Care Provider:  Domenica Harlene LABOR, MD Primary Cardiologist:  Wilbert Bihari, MD  Chief Complaint / Patient Profile   89 y.o. y/o female with a h/o nonobstructive CAD, hypertension, hyperlipidemia, Parkinson's Disease, and bladder cancer who is pending TURBT on 11/30/2024 with Dr. Donnice Siad of [facility] and presents today for telephonic preoperative cardiovascular risk assessment.  History of Present Illness    Stephanie [name] is a 89 y.o. female who presents via audio/video conferencing for a telehealth visit today.  Pt was last seen in cardiology clinic on 04/02/2024 by Dr. Bihari.  At that time Stephanie [name] was doing well. The patient is now pending procedure as outlined above. Since her last visit, she has been stable from a cardiac standpoint.  She reports stable mild dyspnea with more strenuous activities.  She denies chest pain, palpitations, pnd, orthopnea, n, v, dizziness, syncope, edema, weight gain, or early satiety. All other systems reviewed and are otherwise negative except as noted above.   Past Medical History    Past Medical History:  Diagnosis Date   Allergy    Anemia    past hx     Arthropathy of cervical spine 11/27/2012   Right  Xray at [facility]  On 04/08/2016  AP, lateral, and lateral flexion and extension views of the cervical spine are submitted for evaluation.   No acute fracture identified in the cervical spine.   There is redemonstration of approximately 2 mm of C3 on C4 anterolisthesis in neutral which slightly increases in flexion relative to neutral and extension. Slight C5 on C6 retrolisthesis does not change i   Benign hypertension without congestive heart failure    Benign mole    right hcets mole, bleeds when dried with towel   Bilateral dry eyes    Bladder cancer (HCC) 2016/10/23   CAD (coronary artery disease), native coronary artery 04/2023   Coronary CTA showed a coronary calcium  score to 46 with less than 25% left main, 25 to 49% proximal LAD, less than 25% left circumflex stenosis   Cataract    Cervical spondylosis    Cervicalgia    2 screws in place    Chronic kidney disease    Depression with anxiety October 23, 2016   prior to husbands death    Dyspnea    Gross hematuria    developed after first hip replacment, led to indicental finding of bladder tumor, bleeding now resolved    H/O total shoulder replacement, left    History of kidney stones    1970's   Hyperlipidemia    Left hand weakness    due to reinjury of flxor tendon s/p tendon surgery march 12-2018   Lumbar stenosis    Malignant  tumor of urinary bladder (HCC) 07/2016   Pre-stage I   OA (osteoarthritis)    Parkinson disease [facility]) dx 2012   neurologist-  dr siddiqui at [facility]   Psoriatic arthritis [facility])     Dr Ishmael , [place] [facility]    Rupture of flexor tendon of hand 2020   right   Past Surgical History:  Procedure Laterality Date   BIOPSY MASS LEFT FIRST METACARPAL   06/23/2006   benign   CATARACT EXTRACTION W/ INTRAOCULAR LENS  IMPLANT, BILATERAL  1999 and 2003   CERVICAL FUSION  02/2017   c1-c2 ; reports she has 2 screws 2in long in place done at [facility] ; patient  exhibits VERY LIMITED NECK ROM   COLONOSCOPY     COSMETIC SURGERY     CYSTOSCOPY W/ RETROGRADES Bilateral 09/22/2016   Procedure: CYSTOSCOPY WITH RETROGRADE PYELOGRAM;  Surgeon: Ricardo Likens, MD;  Location[facility];  Service: Urology;  Laterality: Bilateral;   D & C HYSTEROSCOPY W/ RESECTION POLYP  07/11/2000   DILATION AND CURETTAGE OF UTERUS  1960's   EXCISION CYST AND DEBRIDEMENT RIGHT WIRST AND REMOVAL FORGEIGN BODY  01/09/2009   EYE SURGERY     FLEXOR TENDON REPAIR Left 12/12/2018   Procedure: REPAIR/TRANSFER FLEXOR DIGITORUM PROFUNDUS OF LEFT SMALL FINGER;  Surgeon: Murrell Kuba, MD;  Location: [facility];  Service: Orthopedics;  Laterality: Left;   JOINT REPLACEMENT     LAPAROSCOPY  yrs ago   infertility    METACARPOPHALANGEAL JOINT ARTHRODESIS Right 05/25/1996   REVERSE SHOULDER ARTHROPLASTY Left 10/14/2022   Procedure: REVERSE SHOULDER ARTHROPLASTY;  Surgeon: Melita Drivers, MD;  Location: [facility];  Service: Orthopedics;  Laterality: Left;    SPINE SURGERY     TENDON REPAIR Left 01/15/2019   Hand   TONSILLECTOMY AND ADENOIDECTOMY  child   TOTAL HIP ARTHROPLASTY Left 07/07/2016   Procedure: LEFT TOTAL HIP ARTHROPLASTY ANTERIOR APPROACH;  Surgeon: Dempsey Moan, MD;  Location: [facility];  Service: Orthopedics;  Laterality: Left;   TOTAL HIP ARTHROPLASTY Right 09/28/2017   Procedure: RIGHT TOTAL HIP ARTHROPLASTY ANTERIOR APPROACH;  Surgeon: Moan Dempsey, MD;  Location: [facility];  Service: Orthopedics;  Laterality: Right;   TOTAL KNEE ARTHROPLASTY Right 07/30/2019   Procedure: TOTAL KNEE ARTHROPLASTY;  Surgeon: Moan Dempsey, MD;  Location: [facility];  Service: Orthopedics;  Laterality: Right;    TRANSURETHRAL RESECTION OF BLADDER TUMOR N/A 09/22/2016   Procedure: TRANSURETHRAL RESECTION OF BLADDER TUMOR (TURBT);  Surgeon: Ricardo Likens, MD;  Location: [facility];  Service: Urology;  Laterality: N/A;     Allergies  Allergies[1]  Home Medications    Prior to Admission medications  Medication Sig Start Date End Date Taking? Authorizing Provider  amLODipine  (NORVASC ) 5 MG tablet TAKE 1 TABLET (5 MG TOTAL) BY MOUTH DAILY. 02/13/24   Domenica Harlene LABOR, MD  Apoaequorin (PREVAGEN PO) Take 1 tablet by mouth in the morning.    [provider]  aspirin EC 81 MG tablet Take 81 mg by mouth daily. Swallow whole.    [provider]  Carbidopa -Levodopa  ER (RYTARY ) 48.75-195 MG CPCR Take 2 capsules by mouth 4 (four) times daily. Takes at 0800, 1200, 1600, and 2000.    [provider]  cholecalciferol (VITAMIN D3) 25 MCG (1000 UNIT) tablet Take 1,000 Units by mouth in the morning.    [provider]  cholestyramine  (QUESTRAN ) 4 g packet Take 1 packet (4 g total) by mouth 2 (two) times  daily as needed. Patient not taking: Reported on 11/20/2024 02/28/23   Domenica Harlene LABOR, MD  Coenzyme Q10 (COQ10 PO) Take 1 tablet by mouth in the morning.    [provider]  diazepam  (VALIUM ) 5 MG tablet Take 0.5 tablets (2.5 mg total) by mouth every 12 (twelve) hours as needed for anxiety. 10/24/24   Wheeler Harlene CROME, NP  DIGESTIVE ENZYMES PO Take 1 capsule by mouth daily.    [provider]  EPINEPHrine  0.3 mg/0.3 mL IJ SOAJ injection Inject 0.3 mg into the muscle as needed for anaphylaxis.    [provider]  ezetimibe  (ZETIA ) 10 MG tablet Take 1 tablet (10 mg total) by mouth daily. Patient taking differently: Take 5 mg by mouth daily. 08/31/24   Webb, Padonda B, FNP  hydrochlorothiazide  (MICROZIDE ) 12.5 MG capsule Take 1 capsule (12.5 mg total) by mouth daily. 07/25/24   Domenica Harlene LABOR, MD  meloxicam  (MOBIC ) 15 MG tablet Take 15 mg by mouth daily.    [provider]  Multiple Vitamin (MULTIVITAMIN WITH MINERALS) TABS tablet Take 1 tablet by mouth in the morning. Centrum Silver    [provider]  Polyethyl Glycol-Propyl Glycol (SYSTANE)  0.4-0.3 % SOLN Place 1-2 drops into both eyes 3 (three) times daily as needed (dry/irritated eyes.).    [provider]  Probiotic Product (PROBIOTIC PO) Take 1 capsule by mouth in the morning and at bedtime. Restore Ultimate Probiotic    [provider]  Secukinumab  (COSENTYX  IV) Inject into the vein.    [provider]    Physical Exam    Vital Signs:  JAYMIE MCKIDDY does not have vital signs available for review today.  Given telephonic nature of communication, physical exam is limited. AAOx3. NAD. Normal affect.  Speech and respirations are unlabored.  Accessory Clinical Findings    None  Assessment & Plan    1.  Preoperative Cardiovascular Risk Assessment:  According to the Revised Cardiac Risk Index (RCRI), her Perioperative Risk of Major Cardiac Event is (%): 0.4. Her Functional Capacity in METs is: 4.4 according to the Duke Activity Status Index (DASI). Therefore, based on ACC/AHA guidelines, patient would be at acceptable risk for the planned procedure without further cardiovascular testing.   The patient was advised that if she develops new symptoms prior to surgery to contact our office to arrange for a follow-up visit, and she verbalized understanding.  Regarding ASA therapy, we recommend continuation of ASA throughout the perioperative period.  However, if the surgeon feels that cessation of ASA is required in the perioperative period, it may be stopped 5-7 days prior to surgery with a plan to resume it as soon as felt to be feasible from a surgical standpoint in the post-operative period.   A copy of this note will be routed to requesting surgeon.  Time:   Today, I have spent 8 minutes with the patient with telehealth technology discussing medical history, symptoms, and management plan.     Damien JAYSON Braver, NP  11/22/2024, 2:10 PM     [1]  Allergies Allergen Reactions   Crestor  [Rosuvastatin ] Other (See Comments)    Myalgias     Cleocin [Clindamycin] Diarrhea   Codeine  Nausea And Vomiting   Hornet Venom    Penicillins Itching and Swelling    Has patient had a PCN reaction causing immediate rash, facial/tongue/throat swelling, SOB or lightheadedness with hypotension: Yes Has patient had a PCN reaction causing severe rash involving mucus membranes or skin necrosis: No  Has patient had a PCN reaction that required hospitalization: No Has patient had a PCN reaction occurring within the last 10 years: No If all of the above answers are NO, then may proceed with Cephalosporin use.    Vibramycin  [Doxycycline ] Diarrhea   Yellow Jacket Venom Swelling   Lodine [Etodolac] Rash   Tramadol Itching and Rash   "

## 2024-11-22 NOTE — Progress Notes (Signed)
 Patient phoned asking if our pharmacy carried Rytary  because if she does not take on schedule her tremors will become uncontrolled.  Spoke with Charlena River in the pharmacy who stated that [facility] does not carry this med and that patient should bring med and let it be administered by nursing.  Patient notified and she will make the nurse aware when she arrives.

## 2024-11-22 NOTE — Telephone Encounter (Signed)
 Changes Requested   Name from pharmacy: CARBIDOPA -LEVODOPA  25-100 TAB <redacted file path>       Will file in chart as: carbidopa -levodopa  (SINEMET ) 25-100 mg per tablet   Sig: Take 2 tablets by mouth 5 (five) times a day.   Disp: 900 tablet    Refills: 1   Start: 11/21/2024   Class: Normal   Non-formulary   Last ordered: 3 weeks ago (10/30/2024) by Toya GORMAN Hatchet, MD <redacted file path>   Last refill: 10/30/2024   Rx #: [id number]   Pharmacy comment: REQUEST FOR 90 DAYS PRESCRIPTION.     To be filled at: [id number] - [place], [place] - [address] - PHONE: [phone] - FAX: [phone]

## 2024-11-26 NOTE — Anesthesia Preprocedure Evaluation (Addendum)
 "                                  Anesthesia Evaluation  Patient identified by MRN, date of birth, ID band Patient awake    Reviewed: Allergy & Precautions, NPO status , Patient's Chart, lab work & pertinent test results  History of Anesthesia Complications Negative for: history of anesthetic complications  Airway Mallampati: II  TM Distance: >3 FB Neck ROM: Full    Dental no notable dental hx. (+) Teeth Intact   Pulmonary shortness of breath and with exertion, neg sleep apnea, neg COPD, Patient abstained from smoking.Not current smoker, former smoker   Pulmonary exam normal breath sounds clear to auscultation       Cardiovascular Exercise Tolerance: Good METShypertension, Pt. on medications + CAD  (-) Past MI (-) dysrhythmias  Rhythm:Regular Rate:Normal - Systolic murmurs    Neuro/Psych  PSYCHIATRIC DISORDERS Anxiety Depression    Parkinson disease  Neuromuscular disease    GI/Hepatic ,neg GERD  ,,(+)     (-) substance abuse    Endo/Other  neg diabetes    Renal/GU CRFRenal disease     Musculoskeletal   Abdominal   Peds  Hematology   Anesthesia Other Findings Per PAT note:  Stephanie [name] is a 89 yo female with PMH of former smoking, HTN, nonobstructive CAD (by imaging), mild chronic DOE, Parkinson's disease, arthritis, anxiety, depression, bladder cancer.   Patient follows with Cardiology for nonobstructive CAD treated medically. Last seen by Dr. Shlomo on 04/02/24. Advised continue medicines and f/u in 1 year. She had a cardiac clearance tele visit on 11/22/24 with NP Monge and was cleared:   Preoperative Cardiovascular Risk Assessment: According to the Revised Cardiac Risk Index (RCRI), her Perioperative Risk of Major Cardiac Event is (%): 0.4. Her Functional Capacity in METs is: 4.4 according to the Duke Activity Status Index (DASI). Therefore, based on ACC/AHA guidelines, patient would be at acceptable risk for the planned procedure without  further cardiovascular testing.   Pt follows with Neurology at [facility] for Parkinson's disease. Last seen on 08/15/24. She is getting botox injections in the neck due to dystonia. Takes Rytary  which patient will be bringing from home.   Patient with mild-moderate hyponatremia (129) on pre op labs. Appears to be chronic. Results routed to PCP and will recheck DOS to ensure stability.   Reproductive/Obstetrics                              Anesthesia Physical Anesthesia Plan  ASA: 3  Anesthesia Plan: General   Post-op Pain Management:    Induction: Intravenous  PONV Risk Score and Plan: 4 or greater and Ondansetron , Dexamethasone  and Treatment may vary due to age or medical condition  Airway Management Planned: Oral ETT  Additional Equipment: None  Intra-op Plan:   Post-operative Plan: Extubation in OR  Informed Consent: I have reviewed the patients History and Physical, chart, labs and discussed the procedure including the risks, benefits and alternatives for the proposed anesthesia with the patient or authorized representative who has indicated his/her understanding and acceptance.     Dental advisory given  Plan Discussed with: CRNA and Surgeon  Anesthesia Plan Comments: (Patient and daughter with multiple anesthesia concerns. First, they are concerned about general anesthesia in light of patient's age and Parkinson disease. They say her neurologist said that general anesthesia is prohibitively  dangerous for her with her Parkinson Disease. They were under the impression today's surgery could be performed under mild or moderate sedation. I informed them that these TURBTs are usually done with paralysis to ensure safety, and Dr Selma in fact did book it specificying intubation and paralysis request. They asked about the possibility of spinal anesthesia. I advised that while it is an option, there are multiple reasons that this may not be the ideal anesthetic:   this case is only booked for 30 minutes which would make her be numb in pacu for a prolonged period of time; spinals potentially have even more of a hemodynamic effect than general anesthesia which is more easily titratable; there is no guarantee we'd be able to get a spinal in the first place, requiring conversion to general anesthesia anyway. They are concerned about the mental effects of anesthesia. I did discuss that studies currently do not show that any one type of anesthesia definitively leads to less post operative cognitive effects than another.   Second, they are concerned about the model of anesthesiologist involvement here, specifically that the MD is not present 100% of the case in the room. I informed them of the two main models being solo MD practice and medical direction + Supervision. I discussed that I direct up to 4 CRNAs in 4 rooms, but that I am present for the crucial parts of the anesthetic and at any time that I or the CRNA deems necessary. I did state it is well within their right to seek care elsewhere if they are unhappy with this model.  Third, patient is concerned because her sister died recently (unclear reason, she says it was post-op after an orthopedic procedure, even after her post-op office visit, but she seems to be attributing the demise to the fact that her sister had to be put under general anesthesia because they could not perform a spinal). I told her I do not know the circumstances or details of this, and expressed my condolences, but that she appears to be optimized medically.  Patient and daughter visibly distraught, I advised them to speak with Dr. Selma. They had no more questions for me.  Discussed risks of anesthesia with patient, including PONV, sore throat, lip/dental/eye damage, post operative cognitive dysfunction. Rare risks discussed as well, such as cardiorespiratory and neurological sequelae, and allergic reactions. Discussed the role of CRNA in patient's  perioperative care. Patient understands.)         Anesthesia Quick Evaluation  "

## 2024-11-26 NOTE — Progress Notes (Signed)
 " Case: [id number] Date/Time: 11/30/24 1100   Procedures:      TURBT (TRANSURETHRAL RESECTION OF BLADDER TUMOR)     URETEROSCOPY (Bilateral)     CYSTOSCOPY, WITH RETROGRADE PYELOGRAM (Bilateral)   Anesthesia type: General   Diagnosis: Malignant neoplasm of overlapping sites of bladder (HCC) [C67.8]   Pre-op diagnosis: BLADDER CANCER   Location: [facility] 07 / [facility]   Surgeons: Selma Donnice SAUNDERS, MD       DISCUSSION: Stephanie [name] is a 89 yo female with PMH of former smoking, HTN, nonobstructive CAD (by imaging), mild chronic DOE, Parkinson's disease, arthritis, anxiety, depression, bladder cancer.  Patient follows with Cardiology for nonobstructive CAD treated medically. Last seen by Dr. Shlomo on 04/02/24. Advised continue medicines and f/u in 1 year. She had a cardiac clearance tele visit on 11/22/24 with NP Monge and was cleared:  Preoperative Cardiovascular Risk Assessment: According to the Revised Cardiac Risk Index (RCRI), her Perioperative Risk of Major Cardiac Event is (%): 0.4. Her Functional Capacity in METs is: 4.4 according to the Duke Activity Status Index (DASI). Therefore, based on ACC/AHA guidelines, patient would be at acceptable risk for the planned procedure without further cardiovascular testing.  Pt follows with Neurology at [facility] for Parkinson's disease. Last seen on 08/15/24. She is getting botox injections in the neck due to dystonia. Takes Rytary  which patient will be bringing from home.  Patient with mild-moderate hyponatremia (129) on pre op labs. Appears to be chronic. Results routed to PCP and will recheck DOS to ensure stability.  VS: BP 126/72   Pulse 89   Temp 36.8 C (Oral)   Resp 12   Ht 5' 2 (1.575 m)   Wt 53.1 kg   LMP 01/13/2013 Comment: spotting-had benign endometrial biopsy   SpO2 98%   BMI 21.40 kg/m   PROVIDERS: Domenica Harlene LABOR, MD Cardiologist - Wilbert Shlomo, MD Neurologist - Toya Hatchet, MD Pulmonologist - Donnice Beals,  MD  LABS: Labs reviewed: Repeat I stat chem 8 (all labs ordered are listed, but only abnormal results are displayed)  Labs Reviewed  BASIC METABOLIC PANEL WITH GFR - Abnormal; Notable for the following components:      Result Value   Sodium 129 (*)    Chloride 95 (*)    Glucose, Bld 108 (*)    BUN 24 (*)    All other components within normal limits  CBC     Echo 04/29/23:  IMPRESSIONS    1. Left ventricular ejection fraction, by estimation, is 60 to 65%. The left ventricle has normal function. The left ventricle has no regional wall motion abnormalities. Left ventricular diastolic parameters were normal. GLS -17.7%.  2. Right ventricular systolic function is normal. The right ventricular size is normal. There is normal pulmonary artery systolic pressure.  3. The mitral valve is normal in structure. No evidence of mitral valve regurgitation. No evidence of mitral stenosis.  4. The aortic valve is normal in structure. Aortic valve regurgitation is not visualized. No aortic stenosis is present.  5. The inferior vena cava is normal in size with greater than 50% respiratory variability, suggesting right atrial pressure of 3 mmHg.  CCTA 04/19/2023:  IMPRESSION: 1. Minimal nonobstructive CAD, CADRADS = 1.   2. Coronary calcium  score of 246. This was N/A percentile, as MESA score only includes up to age 72.   3. Total plaque volume 127 mm3 which is 18th percentile for age- and sex- matched controls (calcified plaque 42 mm3; noncalcified plaque 85  mm3). Total plaque volume is mild/moderate.   4. Normal coronary origin with right dominance.   5. Aortic atherosclerosis.   INTERPRETATION:   CAD-RADS 1: Minimal non-obstructive CAD (1-24%). Consider non-atherosclerotic causes of chest pain. Consider preventive therapy and risk factor modification.  Past Medical History:  Diagnosis Date   Allergy    Anemia    past hx    Arthropathy of cervical spine 11/27/2012   Right   Xray at [facility]  On 04/08/2016  AP, lateral, and lateral flexion and extension views of the cervical spine are submitted for evaluation.   No acute fracture identified in the cervical spine.   There is redemonstration of approximately 2 mm of C3 on C4 anterolisthesis in neutral which slightly increases in flexion relative to neutral and extension. Slight C5 on C6 retrolisthesis does not change i   Benign hypertension without congestive heart failure    Benign mole    right hcets mole, bleeds when dried with towel   Bilateral dry eyes    Bladder cancer (HCC) 19-Oct-2016   CAD (coronary artery disease), native coronary artery 04/2023   Coronary CTA showed a coronary calcium  score to 46 with less than 25% left main, 25 to 49% proximal LAD, less than 25% left circumflex stenosis   Cataract    Cervical spondylosis    Cervicalgia    2 screws in place    Chronic kidney disease    Depression with anxiety 19-Oct-2016   prior to husbands death    Dyspnea    Gross hematuria    developed after first hip replacment, led to indicental finding of bladder tumor, bleeding now resolved    H/O total shoulder replacement, left    History of kidney stones    1970's   Hyperlipidemia    Left hand weakness    due to reinjury of flxor tendon s/p tendon surgery march 12-2018   Lumbar stenosis    Malignant tumor of urinary bladder (HCC) 07/2016   Pre-stage I   OA (osteoarthritis)    Parkinson disease [facility]) dx 2012   neurologist-  dr siddiqui at [facility]   Psoriatic arthritis [facility])     Dr Ishmael , [place] [facility]    Rupture of flexor tendon of hand 2020   right    Past Surgical History:  Procedure Laterality Date   BIOPSY MASS LEFT FIRST METACARPAL   06/23/2006   benign   CATARACT EXTRACTION W/ INTRAOCULAR LENS  IMPLANT, BILATERAL  1999 and 2003   CERVICAL FUSION  02/2017   c1-c2 ; reports she has 2 screws 2in long in place done at [facility] ; patient exhibits VERY LIMITED NECK ROM   COLONOSCOPY      COSMETIC SURGERY     CYSTOSCOPY W/ RETROGRADES Bilateral 09/22/2016   Procedure: CYSTOSCOPY WITH RETROGRADE PYELOGRAM;  Surgeon: Ricardo Likens, MD;  Location: [facility];  Service: Urology;  Laterality: Bilateral;   D & C HYSTEROSCOPY W/ RESECTION POLYP  07/11/2000   DILATION AND CURETTAGE OF UTERUS  1960's   EXCISION CYST AND DEBRIDEMENT RIGHT WIRST AND REMOVAL FORGEIGN BODY  01/09/2009   EYE SURGERY     FLEXOR TENDON REPAIR Left 12/12/2018   Procedure: REPAIR/TRANSFER FLEXOR DIGITORUM PROFUNDUS OF LEFT SMALL FINGER;  Surgeon: Murrell Kuba, MD;  Location: [facility];  Service: Orthopedics;  Laterality: Left;   JOINT REPLACEMENT     LAPAROSCOPY  yrs ago   infertility    METACARPOPHALANGEAL JOINT ARTHRODESIS Right 05/25/1996  REVERSE SHOULDER ARTHROPLASTY Left 10/14/2022   Procedure: REVERSE SHOULDER ARTHROPLASTY;  Surgeon: Melita Drivers, MD;  [facility];  Service: Orthopedics;  Laterality: Left;    SPINE SURGERY     TENDON REPAIR Left 01/15/2019   Hand   TONSILLECTOMY AND ADENOIDECTOMY  child   TOTAL HIP ARTHROPLASTY Left 07/07/2016   Procedure: LEFT TOTAL HIP ARTHROPLASTY ANTERIOR APPROACH;  Surgeon: Dempsey Moan, MD;  [facility];  Service: Orthopedics;  Laterality: Left;   TOTAL HIP ARTHROPLASTY Right 09/28/2017   Procedure: RIGHT TOTAL HIP ARTHROPLASTY ANTERIOR APPROACH;  Surgeon: Moan Dempsey, MD;  [facility];  Service: Orthopedics;  Laterality: Right;   TOTAL KNEE ARTHROPLASTY Right 07/30/2019   Procedure: TOTAL KNEE ARTHROPLASTY;  Surgeon: Moan Dempsey, MD;  [facility];  Service: Orthopedics;  Laterality: Right;    TRANSURETHRAL RESECTION OF BLADDER TUMOR N/A 09/22/2016   Procedure: TRANSURETHRAL RESECTION OF BLADDER TUMOR (TURBT);  Surgeon: Ricardo Likens, MD;  Location: [facility];  Service: Urology;  Laterality: N/A;    MEDICATIONS:  amLODipine  (NORVASC ) 5 MG tablet   Apoaequorin  (PREVAGEN PO)   aspirin EC 81 MG tablet   Carbidopa -Levodopa  ER (RYTARY ) 48.75-195 MG CPCR   cholecalciferol (VITAMIN D3) 25 MCG (1000 UNIT) tablet   cholestyramine  (QUESTRAN ) 4 g packet   Coenzyme Q10 (COQ10 PO)   diazepam  (VALIUM ) 5 MG tablet   DIGESTIVE ENZYMES PO   EPINEPHrine  0.3 mg/0.3 mL IJ SOAJ injection   ezetimibe  (ZETIA ) 10 MG tablet   hydrochlorothiazide  (MICROZIDE ) 12.5 MG capsule   meloxicam  (MOBIC ) 15 MG tablet   Multiple Vitamin (MULTIVITAMIN WITH MINERALS) TABS tablet   Polyethyl Glycol-Propyl Glycol (SYSTANE) 0.4-0.3 % SOLN   Probiotic Product (PROBIOTIC PO)   Secukinumab  (COSENTYX  IV)   No current facility-administered medications for this encounter.   Burnard CHRISTELLA Odis DEVONNA [facility] Short Stay/Anesthesiology [facility] Phone [phone] 11/26/2024 9:52 AM        "

## 2024-11-29 NOTE — H&P (Addendum)
 Urology Preoperative H&P   Chief Complaint: Bladder lesion  History of Present Illness: Stephanie [name] is a 89 y.o. female with a bladder lesion on the dome and mild right hydronephrosis here for cystoscopy, bilateral retrograde pyelograms, possible ureteroscopy, possible instillation of gemcitabine. Denies fevers, chills, dysuria.    Past Medical History:  Diagnosis Date   Allergy    Anemia    past hx    Arthropathy of cervical spine 11/27/2012   Right  Xray at [facility]  On 04/08/2016  AP, lateral, and lateral flexion and extension views of the cervical spine are submitted for evaluation.   No acute fracture identified in the cervical spine.   There is redemonstration of approximately 2 mm of C3 on C4 anterolisthesis in neutral which slightly increases in flexion relative to neutral and extension. Slight C5 on C6 retrolisthesis does not change i   Benign hypertension without congestive heart failure    Benign mole    right hcets mole, bleeds when dried with towel   Bilateral dry eyes    Bladder cancer (HCC) Oct 22, 2016   CAD (coronary artery disease), native coronary artery 04/2023   Coronary CTA showed a coronary calcium  score to 46 with less than 25% left main, 25 to 49% proximal LAD, less than 25% left circumflex stenosis   Cataract    Cervical spondylosis    Cervicalgia    2 screws in place    Chronic kidney disease    Depression with anxiety Oct 22, 2016   prior to husbands death    Dyspnea    Gross hematuria    developed after first hip replacment, led to indicental finding of bladder tumor, bleeding now resolved    H/O total shoulder replacement, left    History of kidney stones    1970's   Hyperlipidemia    Left hand weakness    due to reinjury of flxor tendon s/p tendon surgery march 12-2018   Lumbar stenosis    Malignant tumor of urinary bladder (HCC) 07/2016   Pre-stage I   OA (osteoarthritis)    Parkinson disease [facility]) dx 2012   neurologist-  dr siddiqui at [facility]    Psoriatic arthritis [facility])     Dr Ishmael , [place] [facility]    Rupture of flexor tendon of hand 2020   right    Past Surgical History:  Procedure Laterality Date   BIOPSY MASS LEFT FIRST METACARPAL   06/23/2006   benign   CATARACT EXTRACTION W/ INTRAOCULAR LENS  IMPLANT, BILATERAL  1999 and 2003   CERVICAL FUSION  02/2017   c1-c2 ; reports she has 2 screws 2in long in place done at [facility] ; patient exhibits VERY LIMITED NECK ROM   COLONOSCOPY     COSMETIC SURGERY     CYSTOSCOPY W/ RETROGRADES Bilateral 09/22/2016   Procedure: CYSTOSCOPY WITH RETROGRADE PYELOGRAM;  Surgeon: Ricardo Likens, MD;  Location: [facility];  Service: Urology;  Laterality: Bilateral;   D & C HYSTEROSCOPY W/ RESECTION POLYP  07/11/2000   DILATION AND CURETTAGE OF UTERUS  1960's   EXCISION CYST AND DEBRIDEMENT RIGHT WIRST AND REMOVAL FORGEIGN BODY  01/09/2009   EYE SURGERY     FLEXOR TENDON REPAIR Left 12/12/2018   Procedure: REPAIR/TRANSFER FLEXOR DIGITORUM PROFUNDUS OF LEFT SMALL FINGER;  Surgeon: Murrell Kuba, MD;  Location: [facility];  Service: Orthopedics;  Laterality: Left;   JOINT REPLACEMENT     LAPAROSCOPY  yrs ago   infertility  METACARPOPHALANGEAL JOINT ARTHRODESIS Right 05/25/1996   REVERSE SHOULDER ARTHROPLASTY Left 10/14/2022   Procedure: REVERSE SHOULDER ARTHROPLASTY;  Surgeon: Melita Drivers, MD;  [facility];  Service: Orthopedics;  Laterality: Left;    SPINE SURGERY     TENDON REPAIR Left 01/15/2019   Hand   TONSILLECTOMY AND ADENOIDECTOMY  child   TOTAL HIP ARTHROPLASTY Left 07/07/2016   Procedure: LEFT TOTAL HIP ARTHROPLASTY ANTERIOR APPROACH;  Surgeon: Dempsey Moan, MD;  [facility];  Service: Orthopedics;  Laterality: Left;   TOTAL HIP ARTHROPLASTY Right 09/28/2017   Procedure: RIGHT TOTAL HIP ARTHROPLASTY ANTERIOR APPROACH;  Surgeon: Moan Dempsey, MD;  [facility];  Service: Orthopedics;  Laterality: Right;   TOTAL KNEE  ARTHROPLASTY Right 07/30/2019   Procedure: TOTAL KNEE ARTHROPLASTY;  Surgeon: Moan Dempsey, MD;  [facility];  Service: Orthopedics;  Laterality: Right;    TRANSURETHRAL RESECTION OF BLADDER TUMOR N/A 09/22/2016   Procedure: TRANSURETHRAL RESECTION OF BLADDER TUMOR (TURBT);  Surgeon: Ricardo Likens, MD;  Location: [facility];  Service: Urology;  Laterality: N/A;    Allergies: Allergies[1]  Family History  Problem Relation Age of Onset   Arthritis Mother    Hypertension Mother    Deep vein thrombosis Mother        recurrent, secondary to Bleeding disorder   Arthritis Father    Stroke Father    Bladder Cancer Father    Cancer Father    Diabetes Sister    Heart disease Sister    Obesity Sister    Arthritis Sister    Hypertension Sister    Heart disease Maternal Grandmother    Heart disease Maternal Grandfather    Peripheral vascular disease Paternal Grandmother        s/p leg amputation   Stroke Paternal Grandfather    Esophageal cancer Paternal Aunt    Hearing loss Sister    Arthritis Sister    Colon cancer Neg Hx    Colon polyps Neg Hx    Rectal cancer Neg Hx    Stomach cancer Neg Hx     Social History:  reports that she quit smoking about 60 years ago. Her smoking use included cigarettes. She started smoking about 70 years ago. She has a 2.5 pack-year smoking history. She has never used smokeless tobacco. She reports current alcohol use of about 1.0 standard drink of alcohol per week. She reports that she does not use drugs.  ROS: A complete review of systems was performed.  All systems are negative except for pertinent findings as noted.  Physical Exam:  Vital signs in last 24 hours: Temp:  [96.9 F (36.1 C)] 96.9 F (36.1 C) (01/16 0922) Pulse Rate:  [93] 93 (01/16 0922) Resp:  [14] 14 (01/16 0922) BP: (138)/(82) 138/82 (01/16 0922) SpO2:  [98 %] 98 % (01/16 0922) Weight:  [53.1 kg] 53.1 kg (01/16 0922) Constitutional:  Alert and  oriented, No acute distress Cardiovascular: Regular rate and rhythm Respiratory: Normal respiratory effort, Lungs clear bilaterally GI: Abdomen is soft, nontender, nondistended, no abdominal masses GU: No CVA tenderness Lymphatic: No lymphadenopathy Neurologic: Grossly intact, no focal deficits Psychiatric: Normal mood and affect  Laboratory Data:  No results for input(s): WBC, HGB, HCT, PLT in the last 72 hours.  No results for input(s): NA, K, CL, GLUCOSE, BUN, CALCIUM , CREATININE in the last 72 hours.  Invalid input(s): CO3   No results found for this or any previous visit (from the past 24 hours). No results found  for this or any previous visit (from the past 240 hours).  Renal Function: No results for input(s): CREATININE in the last 168 hours. Estimated Creatinine Clearance: 37 mL/min (by C-G formula based on SCr of 0.75 mg/dL).  Radiologic Imaging: No results found.  I independently reviewed the above imaging studies.  Assessment and Plan KALIA VAHEY is a 89 y.o. female with a bladder lesion on the dome and mild right hydronephrosis here for cystoscopy, bilateral retrograde pyelograms, possible ureteroscopy, possible instillation of gemcitabine.   Matt R. Jari Carollo MD 11/30/2024, 9:49 AM  [facility] Pager: [phone]     [1]  Allergies Allergen Reactions   Crestor  [Rosuvastatin ] Other (See Comments)    Myalgias    Cleocin [Clindamycin] Diarrhea   Codeine  Nausea And Vomiting   Hornet Venom    Penicillins Itching and Swelling    Has patient had a PCN reaction causing immediate rash, facial/tongue/throat swelling, SOB or lightheadedness with hypotension: Yes Has patient had a PCN reaction causing severe rash involving mucus membranes or skin necrosis: No Has patient had a PCN reaction that required hospitalization: No Has patient had a PCN reaction occurring within the last 10 years: No If all of the above answers  are NO, then may proceed with Cephalosporin use.    Vibramycin  [Doxycycline ] Diarrhea   Yellow Jacket Venom Swelling   Lodine [Etodolac] Rash   Tramadol Itching and Rash

## 2024-11-30 ENCOUNTER — Ambulatory Visit (HOSPITAL_BASED_OUTPATIENT_CLINIC_OR_DEPARTMENT_OTHER): Payer: Self-pay | Admitting: Medical

## 2024-11-30 ENCOUNTER — Encounter (HOSPITAL_COMMUNITY): Payer: Self-pay | Admitting: Medical

## 2024-11-30 ENCOUNTER — Encounter (HOSPITAL_COMMUNITY): Payer: Self-pay | Admitting: Urology

## 2024-11-30 ENCOUNTER — Ambulatory Visit (HOSPITAL_COMMUNITY)
Admission: RE | Admit: 2024-11-30 | Discharge: 2024-11-30 | Disposition: A | Discharge status: Home / Self Care | Attending: Urology | Admitting: Urology

## 2024-11-30 ENCOUNTER — Other Ambulatory Visit: Payer: Self-pay

## 2024-11-30 ENCOUNTER — Ambulatory Visit (HOSPITAL_COMMUNITY)

## 2024-11-30 ENCOUNTER — Encounter (HOSPITAL_COMMUNITY): Admission: RE | Disposition: A | Payer: Self-pay | Source: Home / Self Care | Attending: Urology

## 2024-11-30 DIAGNOSIS — Z79899 Other long term (current) drug therapy: Secondary | ICD-10-CM | POA: Diagnosis not present

## 2024-11-30 DIAGNOSIS — D494 Neoplasm of unspecified behavior of bladder: Secondary | ICD-10-CM | POA: Diagnosis not present

## 2024-11-30 DIAGNOSIS — N329 Bladder disorder, unspecified: Secondary | ICD-10-CM | POA: Diagnosis present

## 2024-11-30 DIAGNOSIS — F32A Depression, unspecified: Secondary | ICD-10-CM | POA: Diagnosis not present

## 2024-11-30 DIAGNOSIS — E871 Hypo-osmolality and hyponatremia: Secondary | ICD-10-CM

## 2024-11-30 DIAGNOSIS — Z87891 Personal history of nicotine dependence: Secondary | ICD-10-CM | POA: Diagnosis not present

## 2024-11-30 DIAGNOSIS — F418 Other specified anxiety disorders: Secondary | ICD-10-CM

## 2024-11-30 DIAGNOSIS — I2510 Atherosclerotic heart disease of native coronary artery without angina pectoris: Secondary | ICD-10-CM

## 2024-11-30 DIAGNOSIS — I10 Essential (primary) hypertension: Secondary | ICD-10-CM | POA: Diagnosis not present

## 2024-11-30 DIAGNOSIS — G20A1 Parkinson's disease without dyskinesia, without mention of fluctuations: Secondary | ICD-10-CM | POA: Insufficient documentation

## 2024-11-30 DIAGNOSIS — F419 Anxiety disorder, unspecified: Secondary | ICD-10-CM | POA: Diagnosis not present

## 2024-11-30 HISTORY — PX: CYSTOSCOPY W/ RETROGRADES: SHX1426

## 2024-11-30 HISTORY — PX: URETEROSCOPY: SHX842

## 2024-11-30 HISTORY — PX: TRANSURETHRAL RESECTION OF BLADDER TUMOR: SHX2575

## 2024-11-30 LAB — POCT I-STAT, CHEM 8
BUN: 17 mg/dL (ref 8–23)
Calcium, Ion: 1.14 mmol/L — ABNORMAL LOW (ref 1.15–1.40)
Chloride: 99 mmol/L (ref 98–111)
Creatinine, Ser: 0.8 mg/dL (ref 0.44–1.00)
Glucose, Bld: 93 mg/dL (ref 70–99)
HCT: 36 % (ref 36.0–46.0)
Hemoglobin: 12.2 g/dL (ref 12.0–15.0)
Potassium: 3.6 mmol/L (ref 3.5–5.1)
Sodium: 137 mmol/L (ref 135–145)
TCO2: 24 mmol/L (ref 22–32)

## 2024-11-30 MED ORDER — OXYCODONE HCL 5 MG/5ML PO SOLN: 5.0000 mg | Freq: Once | ORAL | Status: AC | PRN

## 2024-11-30 MED ORDER — OXYCODONE HCL 5 MG PO TABS
5.0000 mg | ORAL_TABLET | Freq: Once | ORAL | Status: AC | PRN
  Administered 2024-11-30: 5 mg via ORAL

## 2024-11-30 MED ORDER — ROCURONIUM BROMIDE 100 MG/10ML IV SOLN
INTRAVENOUS | Status: DC | PRN
  Administered 2024-11-30: 30 mg via INTRAVENOUS
  Administered 2024-11-30: 20 mg via INTRAVENOUS

## 2024-11-30 MED ORDER — SUGAMMADEX SODIUM 200 MG/2ML IV SOLN
INTRAVENOUS | Status: DC | PRN
  Administered 2024-11-30: 180 mg via INTRAVENOUS

## 2024-11-30 MED ORDER — OXYCODONE HCL 5 MG PO TABS
ORAL_TABLET | ORAL | Status: AC
  Filled 2024-11-30: qty 1

## 2024-11-30 MED ORDER — CIPROFLOXACIN IN D5W 400 MG/200ML IV SOLN
400.0000 mg | INTRAVENOUS | Status: AC
  Administered 2024-11-30: 400 mg via INTRAVENOUS
  Filled 2024-11-30: qty 200

## 2024-11-30 MED ORDER — FENTANYL CITRATE (PF) 100 MCG/2ML IJ SOLN
INTRAMUSCULAR | Status: DC | PRN
  Administered 2024-11-30: 50 ug via INTRAVENOUS

## 2024-11-30 MED ORDER — ONDANSETRON HCL 4 MG/2ML IJ SOLN
INTRAMUSCULAR | Status: DC | PRN
  Administered 2024-11-30: 4 mg via INTRAVENOUS

## 2024-11-30 MED ORDER — LIDOCAINE HCL (CARDIAC) PF 100 MG/5ML IV SOSY
PREFILLED_SYRINGE | INTRAVENOUS | Status: DC | PRN
  Administered 2024-11-30: 80 mg via INTRAVENOUS

## 2024-11-30 MED ORDER — LACTATED RINGERS IV SOLN: INTRAVENOUS | Status: DC | PRN

## 2024-11-30 MED ORDER — FENTANYL CITRATE (PF) 50 MCG/ML IJ SOSY
25.0000 ug | PREFILLED_SYRINGE | INTRAMUSCULAR | Status: DC | PRN
  Administered 2024-11-30: 25 ug via INTRAVENOUS

## 2024-11-30 MED ORDER — 0.9 % SODIUM CHLORIDE (POUR BTL) OPTIME
TOPICAL | Status: DC | PRN
  Administered 2024-11-30: 1000 mL

## 2024-11-30 MED ORDER — ORAL CARE MOUTH RINSE: 15.0000 mL | Freq: Once | OROMUCOSAL | Status: AC

## 2024-11-30 MED ORDER — FENTANYL CITRATE (PF) 50 MCG/ML IJ SOSY
PREFILLED_SYRINGE | INTRAMUSCULAR | Status: AC
  Filled 2024-11-30: qty 1

## 2024-11-30 MED ORDER — DEXAMETHASONE SODIUM PHOSPHATE 4 MG/ML IJ SOLN
INTRAMUSCULAR | Status: DC | PRN
  Administered 2024-11-30: 5 mg via INTRAVENOUS

## 2024-11-30 MED ORDER — PROPOFOL 10 MG/ML IV BOLUS
INTRAVENOUS | Status: DC | PRN
  Administered 2024-11-30: 60 mg via INTRAVENOUS

## 2024-11-30 MED ORDER — CHLORHEXIDINE GLUCONATE 0.12 % MT SOLN
15.0000 mL | Freq: Once | OROMUCOSAL | Status: AC
  Administered 2024-11-30: 15 mL via OROMUCOSAL

## 2024-11-30 MED ORDER — LACTATED RINGERS IV SOLN: INTRAVENOUS | Status: DC

## 2024-11-30 MED ORDER — FENTANYL CITRATE (PF) 100 MCG/2ML IJ SOLN
INTRAMUSCULAR | Status: AC
  Filled 2024-11-30: qty 2

## 2024-11-30 MED ORDER — OXYCODONE-ACETAMINOPHEN 5-325 MG PO TABS: 1.0000 | ORAL_TABLET | ORAL | 0 refills | Status: AC | PRN

## 2024-11-30 MED ORDER — SODIUM CHLORIDE 0.9 % IR SOLN
Status: DC | PRN
  Administered 2024-11-30: 6000 mL

## 2024-11-30 NOTE — Anesthesia Postprocedure Evaluation (Signed)
"   Anesthesia Post Note  Patient: Stephanie [name]  Procedure(s) Performed: TURBT (TRANSURETHRAL RESECTION OF BLADDER TUMOR) URETEROSCOPY (Bilateral) CYSTOSCOPY, WITH RETROGRADE PYELOGRAM (Bilateral)     Patient location during evaluation: PACU Anesthesia Type: General Level of consciousness: awake and alert Pain management: pain level controlled Vital Signs Assessment: post-procedure vital signs reviewed and stable Respiratory status: spontaneous breathing, nonlabored ventilation, respiratory function stable and patient connected to nasal cannula oxygen Cardiovascular status: blood pressure returned to baseline and stable Postop Assessment: no apparent nausea or vomiting Anesthetic complications: no   No notable events documented.  Last Vitals:  Vitals:   11/30/24 1330 11/30/24 1345  BP: (!) 127/101 (!) 160/87  Pulse: 79 80  Resp: 11 15  Temp:    SpO2: 99% 100%    Last Pain:  Vitals:   11/30/24 1317  TempSrc:   PainSc: 5                  Rome Ade      "

## 2024-11-30 NOTE — Op Note (Signed)
 Operative Note  Preoperative diagnosis:  1.  Bladder lesion  Postoperative diagnosis: 1.  Bladder lesion  Procedure(s): 1.  TURBT small 2. Bilateral retrograde pyelograms 3. Right diagnostic ureteroscopy  Surgeon: Donnice Siad, MD  Assistants:  None  Anesthesia:  General  Complications:  None  EBL:  Minimal  Specimens: 1.  ID Type Source Tests Collected by Time Destination  1 : Posterior bladder wall biopsy Tissue PATH GU tumor resection SURGICAL PATHOLOGY Siad Donnice SAUNDERS, MD 11/30/2024 1244    Drains/Catheters: 1.  18 French foley  Intraoperative findings:   Erythema in posterior bladder wall about 2 x 2 cm.  No papillary lesions. Right retrograde pyelogram with mild right hydronephrosis with distal right ureter however no hydronephrosis, renal pelvis, no filling defects and no extravasation of contrast. Left retrograde pyelogram with no hydronephrosis, no filling defects and no extravasation of contrast. Diagnostic right ureteroscopy without evidence of suspicious appearing lesion. Biopsy of erythematous area.  Of note, bladder was friable and bled quite easily.  The entirety of the erythematous area was fulgurated with excellent hemostasis and no concern for perforation.   Indication:  JALAYIAH BIBIAN is a 89 y.o. female with a history of high-grade TA UCB resected several years ago who was found to have recurrent erythematous bladder.  She is being brought to the operating today for bladder biopsy.  All the risks, benefits were discussed with the patient to include but not limited to infection, pain, bleeding, damage to adjacent structures, need for further operations, adverse reaction to anesthesia and death.  Patient understands these risks and agrees to proceed with the operation as planned.    Description of procedure: After informed consent was obtained from the patient, the patient was taken to the operating room. General anesthesia was administered. The patient was  placed in dorsal lithotomy position and prepped and draped in usual sterile fashion. Sequential compression devices were applied to lower extremities at the beginning of the case for DVT prophylaxis. Antibiotics were infused prior to surgery start time. A surgical time-out was performed to properly identify the patient, the surgery to be performed, and the surgical site.     We then passed the 21-French rigid cystoscope down the urethra and into the bladder under direct vision without any difficulty. The anterior urethral was normal. The prostate was non-obstructing. The bladder was inspected with 30 and 70 degree lenses. Once in the bladder, systematic evaluation of bladder revealed erythematous area in the posterior bladder wall about 2 x 2 cm.  There were no concerning papillary areas.The ureteral orfices were in orthotopic position and not involved.  Using a 5 French open-ended ureteral catheter, performed right retrograde pyelogram with findings dictated above.  In similar fashion, I performed a left retrograde pyelogram with 5 French open-ended catheter.  Following this I advanced a 0.038 sensor wire to the level of the renal pelvis on the right.  Alongside this wire, passed a semirigid ureteroscope and encountered no suspicious appearing lesion within the distal, mid and proximal right ureter.  I then removed the scope and the wire.   We then removed the cystoscope and then passed down the cold cup biopsy forcep, I took a biopsy of the posterior bladder wall note, this caused immediate bleeding at the bladder was quite friable. 26 French resectoscope sheath down the urethra into the bladder under direct vision with the visual obturator.  Area of erythema was then resected.  The TUR bladder tumor chips were retrieved from the bladder and  this was passed off the field as a specimen.  Hemostasis was achieved using electrocautery.  There were no further areas of bleeding.  I confirmed excellent hemostasis.   I confirmed there is no evidence of perforation.  We then proceeded with removing the resectoscope and then placed in a 18 Foley catheter. The patient tolerated the procedure well with no complication and was awoken from anesthesia and taken to recovery in stable condition.     Plan: Follow-up to review pathology.  She may remove Foley catheter on Monday morning with provided 10 cc syringe.  Matt R. Simuel Stebner MD [facility]  Pager: [phone]

## 2024-11-30 NOTE — Anesthesia Procedure Notes (Signed)
 Procedure Name: Intubation Date/Time: 11/30/2024 12:26 PM  Performed by: Dartha Meckel, CRNAPre-anesthesia Checklist: Patient identified, Emergency Drugs available, Suction available and Patient being monitored Patient Re-evaluated:Patient Re-evaluated prior to induction Oxygen Delivery Method: Circle system utilized Preoxygenation: Pre-oxygenation with 100% oxygen Induction Type: IV induction Ventilation: Mask ventilation without difficulty Laryngoscope Size: Mac and 3 Tube type: Oral Tube size: 6.5 mm Number of attempts: 1 Airway Equipment and Method: Stylet and Oral airway Placement Confirmation: ETT inserted through vocal cords under direct vision, positive ETCO2 and breath sounds checked- equal and bilateral Secured at: 18 cm Tube secured with: Tape Dental Injury: Teeth and Oropharynx as per pre-operative assessment

## 2024-11-30 NOTE — Transfer of Care (Signed)
 Immediate Anesthesia Transfer of Care Note  Patient: Stephanie [name]  Procedure(s) Performed: TURBT (TRANSURETHRAL RESECTION OF BLADDER TUMOR) URETEROSCOPY (Bilateral) CYSTOSCOPY, WITH RETROGRADE PYELOGRAM (Bilateral)  Patient Location: PACU  Anesthesia Type:General  Level of Consciousness: awake and alert   Airway & Oxygen Therapy: Patient Spontanous Breathing and Patient connected to face mask oxygen  Post-op Assessment: Report given to RN and Post -op Vital signs reviewed and stable  Post vital signs: Reviewed and stable  Last Vitals:  Vitals Value Taken Time  BP 149/82 11/30/24 13:10  Temp    Pulse 81 11/30/24 13:10  Resp 15 11/30/24 13:10  SpO2 100 % 11/30/24 13:10  Vitals shown include unfiled device data.  Last Pain:  Vitals:   11/30/24 0922  TempSrc: Axillary  PainSc: 0-No pain         Complications: No notable events documented.

## 2024-11-30 NOTE — Discharge Instructions (Addendum)
 Activity:  You are encouraged to ambulate frequently (about every hour during waking hours) to help prevent blood clots from forming in your legs or lungs.    Diet: You should advance your diet as instructed by your physician.  It will be normal to have some bloating, nausea, and abdominal discomfort intermittently.  Prescriptions:  You will be provided a prescription for pain medication to take as needed.  If your pain is not severe enough to require the prescription pain medication, you may take extra strength Tylenol  instead which will have less side effects.  You should also take a prescribed stool softener to avoid straining with bowel movements as the prescription pain medication may constipate you.  What to call us  about: You should call the office [phone]) if you develop fever > 101 or develop persistent vomiting. Activity:  You are encouraged to ambulate frequently (about every hour during waking hours) to help prevent blood clots from forming in your legs or lungs.    You have a catheter draining bladder.  This may be removed on Monday morning with provided 10 cc syringe.

## 2024-12-01 ENCOUNTER — Encounter (HOSPITAL_COMMUNITY): Payer: Self-pay | Admitting: Urology

## 2024-12-04 LAB — SURGICAL PATHOLOGY

## 2024-12-18 ENCOUNTER — Ambulatory Visit: Admitting: Physical Therapy

## 2024-12-25 ENCOUNTER — Ambulatory Visit: Admitting: Physical Therapy

## 2025-02-21 ENCOUNTER — Ambulatory Visit: Admitting: Podiatry

## 2025-03-21 ENCOUNTER — Encounter: Admitting: Student

## 2025-04-25 ENCOUNTER — Ambulatory Visit: Admitting: Family Medicine

## 2025-05-14 ENCOUNTER — Ambulatory Visit
